# Patient Record
Sex: Male | Born: 1955 | Race: White | Hispanic: No | Marital: Married | Smoking: Never smoker
Health system: Southern US, Community
[De-identification: ages and names within clinical notes are randomized; demographics above are authoritative.]

## PROBLEM LIST (undated history)

## (undated) DIAGNOSIS — I10 Essential (primary) hypertension: Secondary | ICD-10-CM

## (undated) DIAGNOSIS — G4730 Sleep apnea, unspecified: Secondary | ICD-10-CM

## (undated) DIAGNOSIS — L97509 Non-pressure chronic ulcer of other part of unspecified foot with unspecified severity: Secondary | ICD-10-CM

## (undated) DIAGNOSIS — E11621 Type 2 diabetes mellitus with foot ulcer: Secondary | ICD-10-CM

## (undated) DIAGNOSIS — E119 Type 2 diabetes mellitus without complications: Secondary | ICD-10-CM

## (undated) HISTORY — PX: EYE SURGERY: SHX253

## (undated) HISTORY — PX: KNEE ARTHROSCOPY: SHX127

## (undated) HISTORY — PX: BACK SURGERY: SHX140

## (undated) HISTORY — PX: SHOULDER ARTHROSCOPY: SHX128

---

## 2011-09-06 ENCOUNTER — Other Ambulatory Visit: Payer: Self-pay | Admitting: Internal Medicine

## 2011-09-06 DIAGNOSIS — R1011 Right upper quadrant pain: Secondary | ICD-10-CM

## 2011-09-08 ENCOUNTER — Other Ambulatory Visit: Payer: Self-pay

## 2014-10-23 DIAGNOSIS — S83242A Other tear of medial meniscus, current injury, left knee, initial encounter: Secondary | ICD-10-CM | POA: Insufficient documentation

## 2014-10-23 DIAGNOSIS — M1712 Unilateral primary osteoarthritis, left knee: Secondary | ICD-10-CM | POA: Insufficient documentation

## 2015-09-08 DIAGNOSIS — E1142 Type 2 diabetes mellitus with diabetic polyneuropathy: Secondary | ICD-10-CM | POA: Insufficient documentation

## 2015-09-08 DIAGNOSIS — L97512 Non-pressure chronic ulcer of other part of right foot with fat layer exposed: Secondary | ICD-10-CM | POA: Insufficient documentation

## 2018-05-15 ENCOUNTER — Ambulatory Visit: Payer: BC Managed Care – PPO | Admitting: Podiatry

## 2018-05-15 ENCOUNTER — Ambulatory Visit (INDEPENDENT_AMBULATORY_CARE_PROVIDER_SITE_OTHER): Payer: BC Managed Care – PPO

## 2018-05-15 ENCOUNTER — Encounter: Payer: Self-pay | Admitting: Podiatry

## 2018-05-15 VITALS — BP 125/70 | HR 83

## 2018-05-15 DIAGNOSIS — L97521 Non-pressure chronic ulcer of other part of left foot limited to breakdown of skin: Secondary | ICD-10-CM

## 2018-05-15 DIAGNOSIS — L309 Dermatitis, unspecified: Secondary | ICD-10-CM | POA: Diagnosis not present

## 2018-05-15 MED ORDER — CLOTRIMAZOLE-BETAMETHASONE 1-0.05 % EX CREA: 1.0000 "application " | TOPICAL_CREAM | Freq: Two times a day (BID) | CUTANEOUS | 0 refills | Status: DC

## 2018-05-15 MED ORDER — CLOTRIMAZOLE-BETAMETHASONE 1-0.05 % EX CREA: TOPICAL_CREAM | CUTANEOUS | 0 refills | Status: DC

## 2018-05-15 NOTE — Progress Notes (Signed)
dg 

## 2018-05-29 ENCOUNTER — Ambulatory Visit: Payer: BC Managed Care – PPO | Admitting: Podiatry

## 2018-05-29 ENCOUNTER — Encounter: Payer: Self-pay | Admitting: Podiatry

## 2018-05-29 VITALS — BP 130/71 | HR 74 | Temp 98.2°F | Resp 16

## 2018-05-29 DIAGNOSIS — L97521 Non-pressure chronic ulcer of other part of left foot limited to breakdown of skin: Secondary | ICD-10-CM | POA: Diagnosis not present

## 2018-05-29 DIAGNOSIS — B353 Tinea pedis: Secondary | ICD-10-CM | POA: Diagnosis not present

## 2018-05-29 DIAGNOSIS — L309 Dermatitis, unspecified: Secondary | ICD-10-CM | POA: Diagnosis not present

## 2018-05-29 MED ORDER — CLOTRIMAZOLE-BETAMETHASONE 1-0.05 % EX CREA: TOPICAL_CREAM | CUTANEOUS | 0 refills | Status: DC

## 2018-05-29 NOTE — Progress Notes (Signed)
  Subjective:  Patient ID: [name] JeffersonRonald R Depascale, male    DOB: 01/11/1956,  MRN: [id number]  Chief Complaint  Patient presents with  . Foot Ulcer    F/U L foot ulcer Pt. stated," Improving. I think the cream has helped a lot." Tx: lotrisone cream -w/ light clear-bloody drainage -pt. denies N/V/C/F   62 y.o. male returns for wound care. Believes the wound to be improving. States lotrisone helped the irritation on the bottom of the feet.. Denies N/V/F/Ch.  Objective:   Vitals:   05/29/18 0851  BP: 130/71  Pulse: 74  Resp: 16  Temp: 98.2 F (36.8 C)   General AA&O x3. Normal mood and affect.  Vascular Foot warm to touch.  Neurologic Sensation grossly diminished.  Dermatologic (Wound) Wound Location: L heel Wound Measurement: 0.5x0.5x0.1 Wound Base: Granular/Healthy Peri-wound: Calloused, macerated Exudate: None: wound tissue dry  Wound progress: Improved since last check.  Maceration present plantar foot bilat. Vesicular lesions improving.  Orthopedic: No pain to palpation either foot.   Assessment & Plan:  Patient was evaluated and treated and all questions answered.  Ulcer L heel -Debridement as below. -Dressed with medihoney, DSD.  Procedure: Excisional Debridement of Wound Rationale: Removal of non-viable soft tissue from the wound to promote healing.  Anesthesia: none Pre-Debridement Wound Measurements: 0.2 cm x 0.2 cm x 0.1 cm  Post-Debridement Wound Measurements: 0.5 cm x 0.5 cm x 0.1 cm  Type of Debridement: Excisional Tissue Removed: Non-viable soft tissue Depth of Debridement: subq Instrumentation: tissue nipper. Technique: Sharp excisional debridement to bleeding, viable wound base.  Dressing: Dry, sterile, compression dressing. Disposition: Patient tolerated procedure well. Patient to return in 1 week for follow-up.  Tinea with Dermatitis -Continue Lotrisone. Refill sent to pharmacy. Return in about 2 weeks (around 06/12/2018) for Wound Care.

## 2018-06-09 ENCOUNTER — Encounter (HOSPITAL_COMMUNITY): Payer: Self-pay | Admitting: *Deleted

## 2018-06-09 ENCOUNTER — Other Ambulatory Visit: Payer: Self-pay

## 2018-06-09 ENCOUNTER — Observation Stay (HOSPITAL_COMMUNITY)
Admission: EM | Admit: 2018-06-09 | Discharge: 2018-06-10 | Disposition: A | Discharge status: Home / Self Care | Payer: BC Managed Care – PPO | Attending: Internal Medicine | Admitting: Internal Medicine

## 2018-06-09 DIAGNOSIS — E11621 Type 2 diabetes mellitus with foot ulcer: Secondary | ICD-10-CM | POA: Diagnosis not present

## 2018-06-09 DIAGNOSIS — Z794 Long term (current) use of insulin: Secondary | ICD-10-CM | POA: Diagnosis not present

## 2018-06-09 DIAGNOSIS — Z79899 Other long term (current) drug therapy: Secondary | ICD-10-CM | POA: Diagnosis not present

## 2018-06-09 DIAGNOSIS — E1165 Type 2 diabetes mellitus with hyperglycemia: Secondary | ICD-10-CM | POA: Diagnosis not present

## 2018-06-09 DIAGNOSIS — R739 Hyperglycemia, unspecified: Secondary | ICD-10-CM | POA: Diagnosis present

## 2018-06-09 DIAGNOSIS — I10 Essential (primary) hypertension: Secondary | ICD-10-CM | POA: Diagnosis not present

## 2018-06-09 DIAGNOSIS — Z7982 Long term (current) use of aspirin: Secondary | ICD-10-CM | POA: Diagnosis not present

## 2018-06-09 DIAGNOSIS — E860 Dehydration: Secondary | ICD-10-CM | POA: Diagnosis present

## 2018-06-09 DIAGNOSIS — E871 Hypo-osmolality and hyponatremia: Secondary | ICD-10-CM | POA: Insufficient documentation

## 2018-06-09 DIAGNOSIS — E119 Type 2 diabetes mellitus without complications: Secondary | ICD-10-CM

## 2018-06-09 DIAGNOSIS — N179 Acute kidney failure, unspecified: Secondary | ICD-10-CM | POA: Diagnosis not present

## 2018-06-09 DIAGNOSIS — E785 Hyperlipidemia, unspecified: Secondary | ICD-10-CM | POA: Insufficient documentation

## 2018-06-09 DIAGNOSIS — L97509 Non-pressure chronic ulcer of other part of unspecified foot with unspecified severity: Secondary | ICD-10-CM | POA: Diagnosis not present

## 2018-06-09 HISTORY — DX: Essential (primary) hypertension: I10

## 2018-06-09 HISTORY — DX: Type 2 diabetes mellitus with foot ulcer: E11.621

## 2018-06-09 HISTORY — DX: Type 2 diabetes mellitus without complications: E11.9

## 2018-06-09 HISTORY — DX: Non-pressure chronic ulcer of other part of unspecified foot with unspecified severity: L97.509

## 2018-06-09 LAB — URINALYSIS, ROUTINE W REFLEX MICROSCOPIC
BILIRUBIN URINE: NEGATIVE
Bacteria, UA: NONE SEEN
KETONES UR: NEGATIVE mg/dL
LEUKOCYTES UA: NEGATIVE
NITRITE: NEGATIVE
PH: 5 (ref 5.0–8.0)
Protein, ur: NEGATIVE mg/dL
SPECIFIC GRAVITY, URINE: 1.017 (ref 1.005–1.030)

## 2018-06-09 LAB — CBC WITH DIFFERENTIAL/PLATELET
BASOS PCT: 0 %
Basophils Absolute: 0 10*3/uL (ref 0.0–0.1)
EOS PCT: 1 %
Eosinophils Absolute: 0 10*3/uL (ref 0.0–0.7)
HCT: 36.6 % — ABNORMAL LOW (ref 39.0–52.0)
Hemoglobin: 12.9 g/dL — ABNORMAL LOW (ref 13.0–17.0)
LYMPHS PCT: 11 %
Lymphs Abs: 0.8 10*3/uL (ref 0.7–4.0)
MCH: 28.2 pg (ref 26.0–34.0)
MCHC: 35.2 g/dL (ref 30.0–36.0)
MCV: 80.1 fL (ref 78.0–100.0)
MONO ABS: 0.5 10*3/uL (ref 0.1–1.0)
Monocytes Relative: 7 %
Neutro Abs: 5.9 10*3/uL (ref 1.7–7.7)
Neutrophils Relative %: 81 %
PLATELETS: 180 10*3/uL (ref 150–400)
RBC: 4.57 MIL/uL (ref 4.22–5.81)
RDW: 14 % (ref 11.5–15.5)
WBC: 7.2 10*3/uL (ref 4.0–10.5)

## 2018-06-09 LAB — BASIC METABOLIC PANEL
ANION GAP: 9 (ref 5–15)
BUN: 28 mg/dL — AB (ref 8–23)
CALCIUM: 9.3 mg/dL (ref 8.9–10.3)
CO2: 27 mmol/L (ref 22–32)
Chloride: 93 mmol/L — ABNORMAL LOW (ref 98–111)
Creatinine, Ser: 2.14 mg/dL — ABNORMAL HIGH (ref 0.61–1.24)
GFR calc Af Amer: 36 mL/min — ABNORMAL LOW (ref >60)
GFR calc non Af Amer: 31 mL/min — ABNORMAL LOW (ref >60)
GLUCOSE: 557 mg/dL — AB (ref 70–99)
POTASSIUM: 4.5 mmol/L (ref 3.5–5.1)
SODIUM: 129 mmol/L — AB (ref 135–145)

## 2018-06-09 LAB — CBG MONITORING, ED
GLUCOSE-CAPILLARY: 280 mg/dL — AB (ref 70–99)
Glucose-Capillary: 555 mg/dL (ref 70–99)
Glucose-Capillary: 461 mg/dL — ABNORMAL HIGH (ref 70–99)

## 2018-06-09 LAB — CK: CK TOTAL: 233 U/L (ref 49–397)

## 2018-06-09 MED ORDER — ONDANSETRON HCL 4 MG/2ML IJ SOLN: 4.0000 mg | Freq: Four times a day (QID) | INTRAMUSCULAR | Status: DC | PRN

## 2018-06-09 MED ORDER — INSULIN ASPART 100 UNIT/ML ~~LOC~~ SOLN
0.0000 [IU] | Freq: Three times a day (TID) | SUBCUTANEOUS | Status: DC
  Administered 2018-06-10: 3 [IU] via SUBCUTANEOUS

## 2018-06-09 MED ORDER — ACETAMINOPHEN 650 MG RE SUPP: 650.0000 mg | Freq: Four times a day (QID) | RECTAL | Status: DC | PRN

## 2018-06-09 MED ORDER — INSULIN GLARGINE 100 UNIT/ML ~~LOC~~ SOLN
50.0000 [IU] | Freq: Every day | SUBCUTANEOUS | Status: DC
  Filled 2018-06-09: qty 0.5

## 2018-06-09 MED ORDER — INSULIN ASPART 100 UNIT/ML ~~LOC~~ SOLN
11.0000 [IU] | Freq: Once | SUBCUTANEOUS | Status: AC
  Administered 2018-06-09: 11 [IU] via INTRAVENOUS
  Filled 2018-06-09: qty 1

## 2018-06-09 MED ORDER — SODIUM CHLORIDE 0.9 % IV SOLN
INTRAVENOUS | Status: DC
  Administered 2018-06-10: via INTRAVENOUS

## 2018-06-09 MED ORDER — ASPIRIN EC 81 MG PO TBEC
81.0000 mg | DELAYED_RELEASE_TABLET | Freq: Every day | ORAL | Status: DC
  Administered 2018-06-10: 81 mg via ORAL
  Filled 2018-06-09: qty 1

## 2018-06-09 MED ORDER — INSULIN GLARGINE 100 UNIT/ML ~~LOC~~ SOLN
60.0000 [IU] | Freq: Every day | SUBCUTANEOUS | Status: DC
Start: 2018-06-09 — End: 2018-06-09

## 2018-06-09 MED ORDER — ONDANSETRON HCL 4 MG PO TABS: 4.0000 mg | ORAL_TABLET | Freq: Four times a day (QID) | ORAL | Status: DC | PRN

## 2018-06-09 MED ORDER — GI COCKTAIL ~~LOC~~
30.0000 mL | Freq: Once | ORAL | Status: AC
  Administered 2018-06-09: 30 mL via ORAL
  Filled 2018-06-09: qty 30

## 2018-06-09 MED ORDER — ACETAMINOPHEN 325 MG PO TABS: 650.0000 mg | ORAL_TABLET | Freq: Four times a day (QID) | ORAL | Status: DC | PRN

## 2018-06-09 MED ORDER — ATORVASTATIN CALCIUM 10 MG PO TABS: 10.0000 mg | ORAL_TABLET | Freq: Every day | ORAL | Status: DC

## 2018-06-09 MED ORDER — ENOXAPARIN SODIUM 40 MG/0.4ML ~~LOC~~ SOLN
40.0000 mg | SUBCUTANEOUS | Status: DC
  Administered 2018-06-10: 40 mg via SUBCUTANEOUS
  Filled 2018-06-09: qty 0.4

## 2018-06-09 MED ORDER — INSULIN ASPART 100 UNIT/ML ~~LOC~~ SOLN: 4.0000 [IU] | Freq: Three times a day (TID) | SUBCUTANEOUS | Status: DC

## 2018-06-09 MED ORDER — SODIUM CHLORIDE 0.9 % IV BOLUS
1000.0000 mL | Freq: Once | INTRAVENOUS | Status: AC
  Administered 2018-06-09: 1000 mL via INTRAVENOUS

## 2018-06-09 NOTE — ED Provider Notes (Signed)
[facility]-EMERGENCY DEPT Provider Note   CSN: [id number] Arrival date & time: 06/09/18  1722     History   Chief Complaint Chief Complaint  Patient presents with  . Hyperglycemia    HPI [name] is a 62 y.o. male who presents with generalized weakness and hyperglycemia.  Past medical history significant for insulin-dependent diabetes, hypertension, hyperlipidemia.  The patient states that this morning he was in his usual state of health and took his insulin and [place] and ate some bananas.  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  He has been outside until since 9 AM this morning.  Over the course of the day he tried to drink a lot of water and take breaks however he started to feel like his "legs were made of lead" and felt very dehydrated.  He got to the point where he felt he was going to pass out therefore EMS was called.  His blood sugar was checked and was in the 500s.  He states he typically runs between 160 and 200.  He currently feels better after getting 1 L of fluids by EMS.  He denies any recent fever or illness.  He denies any pain, nausea, vomiting. He has been urinating frequently for a couple days.  HPI  History reviewed. No pertinent past medical history.  There are no active problems to display for this patient.   History reviewed. No pertinent surgical history.      Home Medications    Prior to Admission medications   Medication Sig Start Date End Date Taking? Authorizing Provider  aspirin EC 81 MG tablet Take by mouth.    [provider]  atorvastatin (LIPITOR) 10 MG tablet Take by mouth.    [provider]  clotrimazole-betamethasone (LOTRISONE) cream Apply fingertip amount to bottom of both feet daily. 05/29/18   Park LiterPrice, Michael J, DPM  furosemide (LASIX) 40 MG tablet Take by mouth.    [provider]  insulin aspart protamine - aspart (NOVOLOG MIX 70/30 FLEXPEN) (70-30)  100 UNIT/ML FlexPen Inject into the skin.    [provider]  Insulin Degludec (TRESIBA [place]) Inject 80 Units into the skin.    [provider]  Insulin Degludec-Liraglutide (XULTOPHY) 100-3.6 UNIT-MG/ML SOPN Inject into the skin.    [provider]  losartan (COZAAR) 50 MG tablet Take by mouth.    [provider]  UNABLE TO FIND Inject into the skin. Insulin Glargine (TOUJEO SOLOSTAR [place])    [provider]    Family History No family history on file.  Social History Social History   Tobacco Use  . Smoking status: Never Smoker  . Smokeless tobacco: Never Used  Substance Use Topics  . Alcohol use: Not on file    Comment: rarely  . Drug use: Never     Allergies   Patient has no known allergies.   Review of Systems Review of Systems  Constitutional: Negative for fever.  Respiratory: Negative for shortness of breath.   Cardiovascular: Positive for chest pain.  Gastrointestinal: Negative for abdominal pain, nausea and vomiting.  Genitourinary: Positive for frequency.  Neurological: Positive for weakness (generalized) and light-headedness. Negative for syncope.  All other systems reviewed and are negative.    Physical Exam Updated Vital Signs BP (!) 148/74 (BP Location: Right Arm)   Pulse 95   Temp 98 F (36.7 C) (Oral)   Resp 18   SpO2 99%  Physical Exam  Constitutional: He is oriented to person, place, and time. He appears well-developed and well-nourished. No distress.  Calm and cooperative. T shirt is soaked from sweat  HENT:  Head: Normocephalic and atraumatic.  Eyes: Pupils are equal, round, and reactive to light. Conjunctivae are normal. Right eye exhibits no discharge. Left eye exhibits no discharge. No scleral icterus.  Neck: Normal range of motion.  Cardiovascular: Normal rate and regular rhythm.  Pulmonary/Chest: Effort normal and breath sounds normal. No respiratory distress.  Abdominal: Soft. Bowel sounds are  normal. He exhibits no distension. There is no tenderness.  Neurological: He is alert and oriented to person, place, and time.  Skin: Skin is warm and dry.  Psychiatric: He has a normal mood and affect. His behavior is normal.  Nursing note and vitals reviewed.    ED Treatments / Results  Labs (all labs ordered are listed, but only abnormal results are displayed) Labs Reviewed  BASIC METABOLIC PANEL - Abnormal; Notable for the following components:      Result Value   Sodium 129 (*)    Chloride 93 (*)    Glucose, Bld 557 (*)    BUN 28 (*)    Creatinine, Ser 2.14 (*)    GFR calc non Af Amer 31 (*)    GFR calc Af Amer 36 (*)    All other components within normal limits  CBC WITH DIFFERENTIAL/PLATELET - Abnormal; Notable for the following components:   Hemoglobin 12.9 (*)    HCT 36.6 (*)    All other components within normal limits  URINALYSIS, ROUTINE W REFLEX MICROSCOPIC - Abnormal; Notable for the following components:   Glucose, UA >=500 (*)    Hgb urine dipstick SMALL (*)    All other components within normal limits  CBG MONITORING, ED - Abnormal; Notable for the following components:   Glucose-Capillary 555 (*)    All other components within normal limits  CBG MONITORING, ED - Abnormal; Notable for the following components:   Glucose-Capillary 461 (*)    All other components within normal limits  CBG MONITORING, ED - Abnormal; Notable for the following components:   Glucose-Capillary 280 (*)    All other components within normal limits  CK  HIV ANTIBODY (ROUTINE TESTING)  CBC  BASIC METABOLIC PANEL    EKG EKG Interpretation  Date/Time:  Friday June 09 2018 21:09:38 EDT Ventricular Rate:  94 PR Interval:    QRS Duration: 103 QT Interval:  344 QTC Calculation: 431 R Axis:   -24 Text Interpretation:  Sinus rhythm Borderline left axis deviation No old tracing to compare Confirmed by Rolan Bucco [phone]) on 06/09/2018 9:31:11 PM   Radiology No results  found.  Procedures Procedures (including critical care time)  Medications Ordered in ED Medications  acetaminophen (TYLENOL) tablet 650 mg (has no administration in time range)    Or  acetaminophen (TYLENOL) suppository 650 mg (has no administration in time range)  ondansetron (ZOFRAN) tablet 4 mg (has no administration in time range)    Or  ondansetron (ZOFRAN) injection 4 mg (has no administration in time range)  enoxaparin (LOVENOX) injection 40 mg (has no administration in time range)  0.9 %  sodium chloride infusion (has no administration in time range)  aspirin EC tablet 81 mg (has no administration in time range)  atorvastatin (LIPITOR) tablet 10 mg (has no administration in time range)  insulin aspart (novoLOG) injection 0-15 Units (has no administration in time range)  insulin aspart (novoLOG) injection 4  Units (has no administration in time range)  insulin glargine (LANTUS) injection 50 Units (has no administration in time range)  sodium chloride 0.9 % bolus 1,000 mL (0 mLs Intravenous Stopped 06/09/18 2125)  insulin aspart (novoLOG) injection 11 Units (11 Units Intravenous Given 06/09/18 1954)  gi cocktail (Maalox,Lidocaine,Donnatal) (30 mLs Oral Given 06/09/18 2122)     Initial Impression / Assessment and Plan / ED Course  I have reviewed the triage vital signs and the nursing notes.  Pertinent labs & imaging results that were available during my care of the patient were reviewed by me and considered in my medical decision making (see chart for details).  62 year old male presents with generalized weakness, dehydration, hyperglycemia after working outside all day.  He is hypertensive on arrival.  All other vital signs are normal.  Exam is overall unremarkable.  He feels better after receiving fluids by EMS.  Point-of-care glucose is 555.  We will give him additional fluids and insulin and check blood work and urine.  CBC is remarkable for mild anemia.  BMP is remarkable for  hyponatremia (129), hypochloremia (93), marked hyperglycemia (557), elevated BUN and creatinine (28/2.14).  I have no value to compare this to.  Patient denies any known history of chronic kidney disease.  Urine shows over 500 glucose and small hemoglobin.  Repeat CBG is 461.  8:56 PM On recheck the patient states he is now having muscle cramps in the legs.  He is also complaining of some indigestion and chest discomfort.   EKG was reviewed and was obtained by EMS and is normal sinus rhythm.  We will repeat EKG here and give him a GI cocktail.  We will also add on a CK.  11:04 PM CBG is 280. EKG is NSR. He feels better after GI cocktail.  CK is normal. Will admit for further management.    Final Clinical Impressions(s) / ED Diagnoses   Final diagnoses:  Hyperglycemia  AKI (acute kidney injury) [facility])  Dehydration    ED Discharge Orders    None       Bethel Born, PA-C 06/09/18 2305    Rolan Bucco, MD 06/09/18 2350

## 2018-06-09 NOTE — ED Notes (Signed)
Patient aware we need a urine sample.  

## 2018-06-09 NOTE — ED Notes (Signed)
Critical value of glucose 557. PA aware.

## 2018-06-09 NOTE — H&P (Signed)
History and Physical    [name] ZOX:[id number] DOB: 31-Jul-1956 DOA: 06/09/2018  PCP: Hal Morales, NP  Patient coming from: Home  I have personally briefly reviewed patient's old medical records in [facility]  Chief Complaint: Hyperglycemia, dehydration  HPI: [name] is a 62 y.o. male with medical history significant of DM2 with foot ulcers in past, HTN.  Patient presents to the ED with severe dehydration and hyperglycemia.  Took Evaristo Bury and insulin this AM.  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  Over course of day tried to take a lot of breaks and drink a lot of water, however started to have BLE weakness and felt very dehydrated.  EMS called when he got to point he was feeling like he was going to pass out.   ED Course: BGL 500s, 1L IVF with EMS then another 1L in ED.  Sodium 129.  Insulin in ED, repeat BGL 280.  cpk 233, Creat 2.1 unk baseline but not known to have advanced kidney disease.  States he already feels much better post 2L IVF.   Review of Systems: As per HPI otherwise 10 point review of systems negative.   Past Medical History:  Diagnosis Date  . Diabetic foot ulcers (HCC)   . DM2 (diabetes mellitus, type 2) (HCC)   . HTN (hypertension)     History reviewed. No pertinent surgical history.   reports that he has never smoked. He has never used smokeless tobacco. He reports that he drinks alcohol. He reports that he does not use drugs.  No Known Allergies  No family history on file.   Prior to Admission medications   Medication Sig Start Date End Date Taking? Authorizing Provider  aspirin EC 81 MG tablet Take 81 mg by mouth daily.    Yes [provider]  atorvastatin (LIPITOR) 10 MG tablet Take by mouth.   Yes [provider]  clotrimazole-betamethasone (LOTRISONE) cream Apply fingertip amount to bottom of both feet daily. 05/29/18  Yes Park Liter, DPM  furosemide (LASIX) 40 MG tablet Take  20 mg by mouth daily.    Yes [provider]  insulin aspart protamine - aspart (NOVOLOG MIX 70/30 FLEXPEN) (70-30) 100 UNIT/ML FlexPen Inject 11-20 Units into the skin See admin instructions. SLIDING SCALE   Yes [provider]  Insulin Degludec (TRESIBA [place]) Inject 80 Units into the skin.   Yes [provider]  losartan (COZAAR) 50 MG tablet Take by mouth.   Yes [provider]  OZEMPIC 0.25 or 0.5 MG/DOSE SOPN Inject 0.5 mg into the skin once a week. 03/14/18  Yes [provider]  potassium chloride (K-DUR) 10 MEQ tablet Take 10 mEq by mouth daily. 04/12/18  Yes [provider]    Physical Exam: Vitals:   06/09/18 1900 06/09/18 1956 06/09/18 2006 06/09/18 2200  BP: (!) 159/83  114/82 (!) 155/79  Pulse: 92  91 90  Resp: 19  16 20   Temp:      TempSrc:      SpO2: 99%  100% 100%  Weight:  116.1 kg (256 lb)    Height:  6' 0.5" (1.842 m)      Constitutional: NAD, calm, comfortable Eyes: PERRL, lids and conjunctivae normal ENMT: Mucous membranes are moist. Posterior pharynx clear of any exudate or lesions.Normal dentition.  Neck: normal, supple, no masses, no thyromegaly Respiratory: clear to auscultation bilaterally, no wheezing, no crackles. Normal respiratory effort.  No accessory muscle use.  Cardiovascular: Regular rate and rhythm, no murmurs / rubs / gallops. No extremity edema. 2+ pedal pulses. No carotid bruits.  Abdomen: no tenderness, no masses palpated. No hepatosplenomegaly. Bowel sounds positive.  Musculoskeletal: no clubbing / cyanosis. No joint deformity upper and lower extremities. Good ROM, no contractures. Normal muscle tone.  Skin: no rashes, lesions, ulcers. No induration Neurologic: CN 2-12 grossly intact. Sensation intact, DTR normal. Strength 5/5 in all 4.  Psychiatric: Normal judgment and insight. Alert and oriented x 3. Normal mood.    Labs on Admission: I have personally reviewed following labs and imaging  studies  CBC: Recent Labs  Lab 06/09/18 1757  WBC 7.2  NEUTROABS 5.9  HGB 12.9*  HCT 36.6*  MCV 80.1  PLT 180   Basic Metabolic Panel: Recent Labs  Lab 06/09/18 1757  NA 129*  K 4.5  CL 93*  CO2 27  GLUCOSE 557*  BUN 28*  CREATININE 2.14*  CALCIUM 9.3   GFR: Estimated Creatinine Clearance: 47.4 mL/min (A) (by C-G formula based on SCr of 2.14 mg/dL (H)). Liver Function Tests: No results for input(s): AST, ALT, ALKPHOS, BILITOT, PROT, ALBUMIN in the last 168 hours. No results for input(s): LIPASE, AMYLASE in the last 168 hours. No results for input(s): AMMONIA in the last 168 hours. Coagulation Profile: No results for input(s): INR, PROTIME in the last 168 hours. Cardiac Enzymes: Recent Labs  Lab 06/09/18 2055  CKTOTAL 233   BNP (last 3 results) No results for input(s): PROBNP in the last 8760 hours. HbA1C: No results for input(s): HGBA1C in the last 72 hours. CBG: Recent Labs  Lab 06/09/18 1755 06/09/18 2008 06/09/18 2208  GLUCAP 555* 461* 280*   Lipid Profile: No results for input(s): CHOL, HDL, LDLCALC, TRIG, CHOLHDL, LDLDIRECT in the last 72 hours. Thyroid Function Tests: No results for input(s): TSH, T4TOTAL, FREET4, T3FREE, THYROIDAB in the last 72 hours. Anemia Panel: No results for input(s): VITAMINB12, FOLATE, FERRITIN, TIBC, IRON, RETICCTPCT in the last 72 hours. Urine analysis:    Component Value Date/Time   COLORURINE YELLOW 06/09/2018 1757   APPEARANCEUR CLEAR 06/09/2018 1757   LABSPEC 1.017 06/09/2018 1757   PHURINE 5.0 06/09/2018 1757   GLUCOSEU >=500 (A) 06/09/2018 1757   HGBUR SMALL (A) 06/09/2018 1757   BILIRUBINUR NEGATIVE 06/09/2018 1757   KETONESUR NEGATIVE 06/09/2018 1757   PROTEINUR NEGATIVE 06/09/2018 1757   NITRITE NEGATIVE 06/09/2018 1757   LEUKOCYTESUR NEGATIVE 06/09/2018 1757    Radiological Exams on Admission: No results found.  EKG: Independently reviewed.  Assessment/Plan Principal Problem:   AKI (acute  kidney injury) (HCC) Active Problems:   DM2 (diabetes mellitus, type 2) (HCC)   HTN (hypertension)   Dehydration    1. AKI - ddx includes CKD stage 3 due to DM 1. Hold lasix and losartan 2. IVF 3. Repeat BMP in AM 2. Dehydration - 1. IVF as above 2. Repeat BMP in AM 3. DM2 - 1. Lantus 50 QAM 2. Mod SSI AC 3. 4u mealtime novolog 4. HTN - 1. Holding losartan and lasix as above  DVT prophylaxis: Lovenox Code Status: Full Family Communication: No family in room Disposition Plan: Home after admit Consults called: None Admission status: Place in obs   GARDNER, KentuckyJARED M. DO [facility] Pager [phone] Only works nights!  If 7AM-7PM, please contact the primary day team physician taking care of patient  [email or web address] Password [email or web address]  06/09/2018, 10:59 PM

## 2018-06-09 NOTE — ED Notes (Signed)
Lab added on CK.

## 2018-06-09 NOTE — ED Notes (Signed)
ED TO INPATIENT HANDOFF REPORT  Name/Age/Gender [name] 62 y.o. male  Code Status    Code Status Orders  (From admission, onward)        Start     Ordered   06/09/18 2249  Full code  Continuous     06/09/18 2249    Code Status History    This patient has a current code status but no historical code status.      Home/SNF/Other Home  Chief Complaint hyperglycemia/ weakness   Level of Care/Admitting Diagnosis ED Disposition    ED Disposition Condition Comment   Admit  Hospital Area: [facility] [[id number]]  Level of Care: Med-Surg [16]  Diagnosis: AKI (acute kidney injury) [facility]) [800349]  Admitting Physician: Etta Quill [id number]  Attending Physician: Etta Quill [[id number]]  PT Class (Do Not Modify): Observation [104]  PT Acc Code (Do Not Modify): Observation [10022]       Medical History Past Medical History:  Diagnosis Date  . Diabetic foot ulcers ([place])   . DM2 (diabetes mellitus, type 2) ([place])   . HTN (hypertension)     Allergies No Known Allergies  IV Location/Drains/Wounds Patient Lines/Drains/Airways Status   Active Line/Drains/Airways    Name:   Placement date:   Placement time:   Site:   Days:   Peripheral IV 06/09/18 Left Antecubital   06/09/18    1750    Antecubital   less than 1          Labs/Imaging Results for orders placed or performed during the hospital encounter of 06/09/18 (from the past 48 hour(s))  CBG monitoring, ED     Status: Abnormal   Collection Time: 06/09/18  5:55 PM  Result Value Ref Range   Glucose-Capillary 555 (HH) 70 - 99 mg/dL  Basic metabolic panel     Status: Abnormal   Collection Time: 06/09/18  5:57 PM  Result Value Ref Range   Sodium 129 (L) 135 - 145 mmol/L   Potassium 4.5 3.5 - 5.1 mmol/L   Chloride 93 (L) 98 - 111 mmol/L    Comment: Please note change in reference range.   CO2 27 22 - 32 mmol/L   Glucose, Bld 557 (HH) 70 - 99 mg/dL    Comment: Please note change in  reference range. CRITICAL RESULT CALLED TO, READ BACK BY AND VERIFIED WITH: BAUGHN,L. RN '@[id number]'  ON 06.28.19 BY COHEN,K    BUN 28 (H) 8 - 23 mg/dL    Comment: Please note change in reference range.   Creatinine, Ser 2.14 (H) 0.61 - 1.24 mg/dL   Calcium 9.3 8.9 - 10.3 mg/dL   GFR calc non Af Amer 31 (L) >60 mL/min   GFR calc Af Amer 36 (L) >60 mL/min    Comment: (NOTE) The eGFR has been calculated using the CKD EPI equation. This calculation has not been validated in all clinical situations. eGFR's persistently <60 mL/min signify possible Chronic Kidney Disease.    Anion gap 9 5 - 15    Comment: Performed at [facility], [place] [address] [place], [place] [address]  CBC with Differential     Status: Abnormal   Collection Time: 06/09/18  5:57 PM  Result Value Ref Range   WBC 7.2 4.0 - 10.5 K/uL   RBC 4.57 4.22 - 5.81 MIL/uL   Hemoglobin 12.9 (L) 13.0 - 17.0 g/dL   HCT 36.6 (L) 39.0 - 52.0 %   MCV 80.1 78.0 - 100.0 fL  MCH 28.2 26.0 - 34.0 pg   MCHC 35.2 30.0 - 36.0 g/dL   RDW 14.0 11.5 - 15.5 %   Platelets 180 150 - 400 K/uL   Neutrophils Relative % 81 %   Neutro Abs 5.9 1.7 - 7.7 K/uL   Lymphocytes Relative 11 %   Lymphs Abs 0.8 0.7 - 4.0 K/uL   Monocytes Relative 7 %   Monocytes Absolute 0.5 0.1 - 1.0 K/uL   Eosinophils Relative 1 %   Eosinophils Absolute 0.0 0.0 - 0.7 K/uL   Basophils Relative 0 %   Basophils Absolute 0.0 0.0 - 0.1 K/uL    Comment: Performed at [facility], [place] [address], [place], [place] [address]  Urinalysis, Routine w reflex microscopic     Status: Abnormal   Collection Time: 06/09/18  5:57 PM  Result Value Ref Range   Color, Urine YELLOW YELLOW   APPearance CLEAR CLEAR   Specific Gravity, Urine 1.017 1.005 - 1.030   pH 5.0 5.0 - 8.0   Glucose, UA >=500 (A) NEGATIVE mg/dL   Hgb urine dipstick SMALL (A) NEGATIVE   Bilirubin Urine NEGATIVE NEGATIVE   Ketones, ur NEGATIVE NEGATIVE mg/dL   Protein, ur NEGATIVE  NEGATIVE mg/dL   Nitrite NEGATIVE NEGATIVE   Leukocytes, UA NEGATIVE NEGATIVE   RBC / HPF 0-5 0 - 5 RBC/hpf   WBC, UA 0-5 0 - 5 WBC/hpf   Bacteria, UA NONE SEEN NONE SEEN   Mucus PRESENT    Hyaline Casts, UA PRESENT     Comment: Performed at [facility], [place] [address], [place], [place] [address]  POC CBG, ED     Status: Abnormal   Collection Time: 06/09/18  8:08 PM  Result Value Ref Range   Glucose-Capillary 461 (H) 70 - 99 mg/dL  CK     Status: None   Collection Time: 06/09/18  8:55 PM  Result Value Ref Range   Total CK 233 49 - 397 U/L    Comment: Performed at [facility], [place] [address] [place], [place] [address]  POC CBG, ED     Status: Abnormal   Collection Time: 06/09/18 10:08 PM  Result Value Ref Range   Glucose-Capillary 280 (H) 70 - 99 mg/dL   No results found.  Pending Labs Unresulted Labs (From admission, onward)   Start     Ordered   06/10/18 0500  CBC  Tomorrow morning,   R     06/09/18 2249   06/10/18 7482  Basic metabolic panel  Tomorrow morning,   R     06/09/18 2249   06/09/18 2249  HIV antibody (Routine Testing)  Once,   R     06/09/18 2249      Vitals/Pain Today's Vitals   06/09/18 1900 06/09/18 1956 06/09/18 2006 06/09/18 2200  BP: (!) 159/83  114/82 (!) 155/79  Pulse: 92  91 90  Resp: '19  16 20  ' Temp:      TempSrc:      SpO2: 99%  100% 100%  Weight:  256 lb (116.1 kg)    Height:  6' 0.5" (1.842 m)      Isolation Precautions No active isolations  Medications Medications  acetaminophen (TYLENOL) tablet 650 mg (has no administration in time range)    Or  acetaminophen (TYLENOL) suppository 650 mg (has no administration in time range)  ondansetron (ZOFRAN) tablet 4 mg (has no administration in time range)    Or  ondansetron (ZOFRAN)  injection 4 mg (has no administration in time range)  enoxaparin (LOVENOX) injection 40 mg (has no administration in time range)  0.9 %  sodium chloride infusion (has  no administration in time range)  aspirin EC tablet 81 mg (has no administration in time range)  atorvastatin (LIPITOR) tablet 10 mg (has no administration in time range)  insulin aspart (novoLOG) injection 0-15 Units (has no administration in time range)  insulin aspart (novoLOG) injection 4 Units (has no administration in time range)  insulin glargine (LANTUS) injection 50 Units (has no administration in time range)  sodium chloride 0.9 % bolus 1,000 mL (0 mLs Intravenous Stopped 06/09/18 2125)  insulin aspart (novoLOG) injection 11 Units (11 Units Intravenous Given 06/09/18 1954)  gi cocktail (Maalox,Lidocaine,Donnatal) (30 mLs Oral Given 06/09/18 2122)    Mobility walks

## 2018-06-09 NOTE — ED Triage Notes (Signed)
Per EMS, pt called out for hyperglycemia.  Pt states he felt weak and had the nurse check his blood sugar, which was 510. 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 Pt states he took his long acting insulin this morning.

## 2018-06-10 DIAGNOSIS — L97509 Non-pressure chronic ulcer of other part of unspecified foot with unspecified severity: Secondary | ICD-10-CM

## 2018-06-10 DIAGNOSIS — N179 Acute kidney failure, unspecified: Secondary | ICD-10-CM

## 2018-06-10 DIAGNOSIS — I10 Essential (primary) hypertension: Secondary | ICD-10-CM | POA: Diagnosis not present

## 2018-06-10 DIAGNOSIS — E11621 Type 2 diabetes mellitus with foot ulcer: Secondary | ICD-10-CM | POA: Diagnosis not present

## 2018-06-10 DIAGNOSIS — Z794 Long term (current) use of insulin: Secondary | ICD-10-CM | POA: Diagnosis not present

## 2018-06-10 DIAGNOSIS — E860 Dehydration: Secondary | ICD-10-CM | POA: Diagnosis not present

## 2018-06-10 DIAGNOSIS — R739 Hyperglycemia, unspecified: Secondary | ICD-10-CM

## 2018-06-10 LAB — CBC
HEMATOCRIT: 34.2 % — AB (ref 39.0–52.0)
HEMOGLOBIN: 11.9 g/dL — AB (ref 13.0–17.0)
MCH: 27.9 pg (ref 26.0–34.0)
MCHC: 34.8 g/dL (ref 30.0–36.0)
MCV: 80.1 fL (ref 78.0–100.0)
Platelets: 146 10*3/uL — ABNORMAL LOW (ref 150–400)
RBC: 4.27 MIL/uL (ref 4.22–5.81)
RDW: 14.2 % (ref 11.5–15.5)
WBC: 5.3 10*3/uL (ref 4.0–10.5)

## 2018-06-10 LAB — BASIC METABOLIC PANEL
ANION GAP: 10 (ref 5–15)
BUN: 23 mg/dL (ref 8–23)
CALCIUM: 8.9 mg/dL (ref 8.9–10.3)
CHLORIDE: 101 mmol/L (ref 98–111)
CO2: 24 mmol/L (ref 22–32)
Creatinine, Ser: 1.34 mg/dL — ABNORMAL HIGH (ref 0.61–1.24)
GFR calc Af Amer: >60 mL/min (ref >60)
GFR calc non Af Amer: 55 mL/min — ABNORMAL LOW (ref >60)
GLUCOSE: 227 mg/dL — AB (ref 70–99)
POTASSIUM: 3.7 mmol/L (ref 3.5–5.1)
Sodium: 135 mmol/L (ref 135–145)

## 2018-06-10 LAB — HIV ANTIBODY (ROUTINE TESTING W REFLEX): HIV SCREEN 4TH GENERATION: NONREACTIVE

## 2018-06-10 LAB — GLUCOSE, CAPILLARY
Glucose-Capillary: 183 mg/dL — ABNORMAL HIGH (ref 70–99)
Glucose-Capillary: 267 mg/dL — ABNORMAL HIGH (ref 70–99)

## 2018-06-10 NOTE — Progress Notes (Signed)
DC instructions provided to patient and explained in detail. Patient verbalized understanding. Patient escorted to lobby in wheelchair to be discharged home via [facility].

## 2018-06-10 NOTE — Progress Notes (Signed)
Pt states that he performs daily wound care to left heel wound and is being followed by foot MD for a diabetic foot ulcer. Pt states that he applies foam type dressing and then wraps with dry dressing. Pt did not want RN to remove dressing at this time, stating that he had changed it this morning and it was not due to be changed. Pt states that he will allow RN to assess wound in AM if he is not discharged.

## 2018-06-10 NOTE — Discharge Instructions (Signed)

## 2018-06-10 NOTE — Discharge Summary (Addendum)
Discharge Summary  [name] ZDG:[id number] DOB: 1956/03/29  PCP: Hal Morales, NP  Admit date: 06/09/2018 Discharge date: 06/10/2018  Time spent: 25 minutes  Recommendations for Outpatient Follow-up:  1. Take your medications as prescribed 2. Follow-up with PCP   Discharge Diagnoses:  Active Hospital Problems   Diagnosis Date Noted  . AKI (acute kidney injury) (HCC) 06/09/2018  . DM2 (diabetes mellitus, type 2) (HCC) 06/09/2018  . HTN (hypertension) 06/09/2018  . Dehydration 06/09/2018    Resolved Hospital Problems  No resolved problems to display.    Discharge Condition: Stable  Diet recommendation: Resume previous diet  Vitals:   06/10/18 0016 06/10/18 0442  BP: 136/72 (!) 142/74  Pulse: 87 81  Resp: 20 17  Temp: 98.7 F (37.1 C) 98.7 F (37.1 C)  SpO2: 100% 100%    History of present illness:  [name] is a 62 y.o. male with medical history significant of DM2 with foot ulcers in past, HTN.  Patient presents to the ED with severe dehydration and hyperglycemia.  Took [name] and insulin this AM.  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  Over course of day tried to take a lot of breaks and drink a lot of water, however started to have BLE weakness and felt very dehydrated.  EMS called when he got to point he was feeling like he was going to pass out.   ED Course: BGL 500s, 1L IVF with EMS then another 1L in ED.  Sodium 129.  Insulin in ED, repeat BGL 280.  cpk 233, Creat 2.1 unk baseline but not known to have advanced kidney disease.  States he already feels much better post 2L IVF.  06/10/2018: Patient seen and examined at his bedside.  Has no new complaints.  States he had forgotten to take his short acting insulin prior to leaving for work.  On the day of discharge patient was hemodynamically stable.  He will need to follow-up with his primary care provider post hospitalization.  Hospital Course:  Principal Problem:  AKI (acute kidney injury) (HCC) Active Problems:   DM2 (diabetes mellitus, type 2) (HCC)   HTN (hypertension)   Dehydration  1. AKI suspect prerenal secondary to dehydration-resolving after IV fluid hydration-creatinine 1.34 from 2.18.  Follow-up with your primary care provider within a week. 2. Acute dehydration -Resolved after IV fluid hydration-encouraged increasing oral fluid intake.   3. DM2 continue insulin home regimen-follow-up with your primary care provider within a week. 4. HTN resume home medications.  Suspect AKI was secondary to dehydration, not from his medications. 5. Elevated CPK-233 suspect secondary to strenuous physical work which was self-reported.  Increase oral fluid intake.  Follow-up with your PCP within a week.   Procedures:  None  Consultations:  None  Discharge Exam: BP (!) 142/74 (BP Location: Right Arm)   Pulse 81   Temp 98.7 F (37.1 C) (Oral)   Resp 17   Ht 6' 0.5" (1.842 m)   Wt 116.1 kg (256 lb)   SpO2 100%   BMI 34.24 kg/m  . General: 62 y.o. year-old male well developed well nourished in no acute distress.  Alert and oriented x3. . Cardiovascular: Regular rate and rhythm with no rubs or gallops.  No thyromegaly or JVD noted.   Marland Kitchen Respiratory: Clear to auscultation with no wheezes or rales. Good inspiratory effort. . Abdomen: Soft nontender nondistended with normal bowel sounds x4 quadrants. . Musculoskeletal: No lower extremity  edema. 2/4 pulses in all 4 extremities. . Skin: No ulcerative lesions noted or rashes, . Psychiatry: Mood is appropriate for condition and setting  Discharge Instructions You were cared for by a hospitalist during your hospital stay. If you have any questions about your discharge medications or the care you received while you were in the hospital after you are discharged, you can call the unit and asked to speak with the hospitalist on call if the hospitalist that took care of you is not available. Once you are  discharged, your primary care physician will handle any further medical issues. Please note that NO REFILLS for any discharge medications will be authorized once you are discharged, as it is imperative that you return to your primary care physician (or establish a relationship with a primary care physician if you do not have one) for your aftercare needs so that they can reassess your need for medications and monitor your lab values.   Allergies as of 06/10/2018   No Known Allergies     Medication List    TAKE these medications   aspirin EC 81 MG tablet Take 81 mg by mouth daily.   atorvastatin 10 MG tablet Commonly known as:  LIPITOR Take by mouth.   clotrimazole-betamethasone cream Commonly known as:  LOTRISONE Apply fingertip amount to bottom of both feet daily.   furosemide 40 MG tablet Commonly known as:  LASIX Take 20 mg by mouth daily.   losartan 50 MG tablet Commonly known as:  COZAAR Take by mouth.   NOVOLOG MIX 70/30 FLEXPEN (70-30) 100 UNIT/ML FlexPen Generic drug:  insulin aspart protamine - aspart Inject 11-20 Units into the skin See admin instructions. SLIDING SCALE   OZEMPIC 0.25 or 0.5 MG/DOSE Sopn Generic drug:  Semaglutide Inject 0.5 mg into the skin once a week.   potassium chloride 10 MEQ tablet Commonly known as:  K-DUR Take 10 mEq by mouth daily.   TRESIBA  Inject 80 Units into the skin.      No Known Allergies Follow-up Information    Hal Morales, NP. Call in 1 day(s).   Specialty:  Nurse Practitioner Why:  Please call for appointment. Contact information: [address] [place] [address] [phone]            The results of significant diagnostics from this hospitalization (including imaging, microbiology, ancillary and laboratory) are listed below for reference.    Significant Diagnostic Studies: Dg Foot Complete Left  Result Date: 05/15/2018 Please see detailed radiograph report in office note.   Microbiology: No  results found for this or any previous visit (from the past 240 hour(s)).   Labs: Basic Metabolic Panel: Recent Labs  Lab 06/09/18 1757 06/10/18 0502  NA 129* 135  K 4.5 3.7  CL 93* 101  CO2 27 24  GLUCOSE 557* 227*  BUN 28* 23  CREATININE 2.14* 1.34*  CALCIUM 9.3 8.9   Liver Function Tests: No results for input(s): AST, ALT, ALKPHOS, BILITOT, PROT, ALBUMIN in the last 168 hours. No results for input(s): LIPASE, AMYLASE in the last 168 hours. No results for input(s): AMMONIA in the last 168 hours. CBC: Recent Labs  Lab 06/09/18 1757 06/10/18 0502  WBC 7.2 5.3  NEUTROABS 5.9  --   HGB 12.9* 11.9*  HCT 36.6* 34.2*  MCV 80.1 80.1  PLT 180 146*   Cardiac Enzymes: Recent Labs  Lab 06/09/18 2055  CKTOTAL 233   BNP: BNP (last 3 results) No results for input(s): BNP in  the last 8760 hours.  ProBNP (last 3 results) No results for input(s): PROBNP in the last 8760 hours.  CBG: Recent Labs  Lab 06/09/18 1755 06/09/18 2008 06/09/18 2208 06/10/18 0025 06/10/18 0754  GLUCAP 555* 461* 280* 267* 183*       Signed:  Darlin Droparole N Hall, MD [facility] 06/10/2018, 10:58 AM

## 2018-06-10 NOTE — Plan of Care (Signed)
Reviewed plan of care with patient, specifically safety precautions and importance of notifying RN with questions or concerns. Pt attentive and verbalized understanding of all education.

## 2018-06-11 NOTE — Progress Notes (Signed)
  Subjective:  Patient ID: [name], male    DOB: 1956/11/20,  MRN: [id number]  Chief Complaint  Patient presents with  . Foot Ulcer    left foot heel requesting debridement   62 y.o. male presents with the above complaint.  Reports history of ulcer to the left heel x2 to 3 months also complains of scaling and itching to the bottom of the foot Past Medical History:  Diagnosis Date  . Diabetic foot ulcers (HCC)   . DM2 (diabetes mellitus, type 2) (HCC)   . HTN (hypertension)    No past surgical history on file.  Current Outpatient Medications:  .  aspirin EC 81 MG tablet, Take 81 mg by mouth daily. , Disp: , Rfl:  .  atorvastatin (LIPITOR) 10 MG tablet, Take by mouth., Disp: , Rfl:  .  furosemide (LASIX) 40 MG tablet, Take 20 mg by mouth daily. , Disp: , Rfl:  .  insulin aspart protamine - aspart (NOVOLOG MIX 70/30 FLEXPEN) (70-30) 100 UNIT/ML FlexPen, Inject 11-20 Units into the skin See admin instructions. SLIDING SCALE, Disp: , Rfl:  .  Insulin Degludec (TRESIBA [place]), Inject 80 Units into the skin., Disp: , Rfl:  .  losartan (COZAAR) 50 MG tablet, Take by mouth., Disp: , Rfl:  .  clotrimazole-betamethasone (LOTRISONE) cream, Apply fingertip amount to bottom of both feet daily., Disp: 30 g, Rfl: 0 .  OZEMPIC 0.25 or 0.5 MG/DOSE SOPN, Inject 0.5 mg into the skin once a week., Disp: , Rfl: 5 .  potassium chloride (K-DUR) 10 MEQ tablet, Take 10 mEq by mouth daily., Disp: , Rfl: 0  No Known Allergies Review of Systems: Negative except as noted in the HPI. Denies N/V/F/Ch. Objective:   Vitals:   05/15/18 1037  BP: 125/70  Pulse: 83   General AA&O x3. Normal mood and affect.  Vascular Dorsalis pedis and posterior tibial pulses  present 2+ bilaterally  Capillary refill normal to all digits. Pedal hair growth normal.  Neurologic Epicritic sensation grossly present.  Dermatologic No open lesions. Interspaces clear of maceration. Nails well groomed and normal in  appearance. Left heel superficial ulcer one by one post debridement without erythema warmth or signs of infection    Orthopedic: MMT 5/5 in dorsiflexion, plantarflexion, inversion, and eversion. Normal joint ROM without pain or crepitus.   Assessment & Plan:  Patient was evaluated and treated and all questions answered.  Debrided as below left heel wound -Debrided as below.  Procedure: Selective Debridement of Wound Rationale: Removal of devitalized tissue from the wound to promote healing.  Pre-Debridement Wound Measurements: 1 cm x 1 cm x 0.2 cm  Post-Debridement Wound Measurements: same as pre-debridement. Type of Debridement: Selective Tissue Removed: Devitalized soft-tissue Instrumentation: 3-0 mm dermal curette Dressing: Dry, sterile, compression dressing. Disposition: Patient tolerated procedure well. Patient to return in 1 week for follow-up.    Dermatitis -Rx Lotrisone.  Return in about 2 weeks (around 05/29/2018) for Wound Care.

## 2018-06-12 ENCOUNTER — Ambulatory Visit: Payer: BC Managed Care – PPO | Admitting: Podiatry

## 2018-06-20 ENCOUNTER — Ambulatory Visit: Payer: BC Managed Care – PPO | Admitting: Podiatry

## 2018-08-29 DIAGNOSIS — G5620 Lesion of ulnar nerve, unspecified upper limb: Secondary | ICD-10-CM | POA: Insufficient documentation

## 2018-09-18 ENCOUNTER — Ambulatory Visit: Payer: BC Managed Care – PPO | Admitting: Podiatry

## 2018-09-18 ENCOUNTER — Encounter: Payer: Self-pay | Admitting: Podiatry

## 2018-09-18 VITALS — BP 144/82 | HR 84 | Temp 98.4°F | Resp 16

## 2018-09-18 DIAGNOSIS — L97421 Non-pressure chronic ulcer of left heel and midfoot limited to breakdown of skin: Secondary | ICD-10-CM

## 2018-09-18 DIAGNOSIS — E11621 Type 2 diabetes mellitus with foot ulcer: Secondary | ICD-10-CM

## 2018-09-18 NOTE — Progress Notes (Signed)
Subjective:  Patient ID: [name], male    DOB: 1956-07-27,  MRN: [id number]  Chief Complaint  Patient presents with  . Foot Ulcer    F/U L heel ulcer Pt. stated," when I'm on my feet a lot it gets very sore, otherwise it's doing fine." Tx: cushion pads -pt denies N/V/F/?Ch/swelling or redness -w/ clear drainage    62 y.o. male presents for wound care. States the wound has had some clear drainage denies swelling redness. Patient was last seen in June and did not follow up as directed.  Review of Systems: Negative except as noted in the HPI. Denies N/V/F/Ch.  Past Medical History:  Diagnosis Date  . Diabetic foot ulcers (HCC)   . DM2 (diabetes mellitus, type 2) (HCC)   . HTN (hypertension)     Current Outpatient Medications:  .  aspirin EC 81 MG tablet, Take 81 mg by mouth daily. , Disp: , Rfl:  .  atorvastatin (LIPITOR) 10 MG tablet, Take by mouth., Disp: , Rfl:  .  clotrimazole-betamethasone (LOTRISONE) cream, Apply fingertip amount to bottom of both feet daily., Disp: 30 g, Rfl: 0 .  furosemide (LASIX) 40 MG tablet, Take 20 mg by mouth daily. , Disp: , Rfl:  .  insulin aspart protamine - aspart (NOVOLOG MIX 70/30 FLEXPEN) (70-30) 100 UNIT/ML FlexPen, Inject 11-20 Units into the skin See admin instructions. SLIDING SCALE, Disp: , Rfl:  .  Insulin Degludec (TRESIBA [place]), Inject 80 Units into the skin., Disp: , Rfl:  .  losartan (COZAAR) 50 MG tablet, Take by mouth., Disp: , Rfl:  .  OZEMPIC 0.25 or 0.5 MG/DOSE SOPN, Inject 0.5 mg into the skin once a week., Disp: , Rfl: 5 .  potassium chloride (K-DUR) 10 MEQ tablet, Take 10 mEq by mouth daily., Disp: , Rfl: 0  Social History   Tobacco Use  Smoking Status Never Smoker  Smokeless Tobacco Never Used    Allergies  Allergen Reactions  . Other    Objective:   Vitals:   09/18/18 1320  BP: (!) 144/82  Pulse: 84  Resp: 16  Temp: 98.4 F (36.9 C)   There is no height or weight on file to calculate BMI. Constitutional  Well developed. Well nourished.  Vascular Dorsalis pedis pulses palpable bilaterally. Posterior tibial pulses palpable bilaterally. Capillary refill normal to all digits.  No cyanosis or clubbing noted. Pedal hair growth normal.  Neurologic Normal speech. Oriented to person, place, and time. Protective sensation absent  Dermatologic Wound Location: L heel Wound Base: Granular/Healthy Peri-wound: Macerated, Calloused Exudate: Moderate amount Serosanguinous exudate Wound Measurements: -1.5x1.5 superficial   Orthopedic: No pain to palpation either foot.   Radiographs: None today Assessment:   1. 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  Ulcer L Heel -Debridement as below. -Dressed with medihoney, DSD. -Continue off-loading with surgical shoe.  Procedure: Excisional Debridement of Wound Rationale: Removal of non-viable soft tissue from the wound to promote healing.  Anesthesia: none Pre-Debridement Wound Measurements: 0.5 cm x 0.5 cm   Post-Debridement Wound Measurements: 1.5 cm x 1.5 cm  Type of Debridement: Sharp Excisional Tissue Removed: Non-viable soft tissue Depth of Debridement: subcutaneous tissue. Technique: Sharp excisional debridement to bleeding, viable wound base.  Dressing: Dry, sterile, compression dressing. Disposition: Patient tolerated procedure well. Patient to return in 1 week for follow-up.  Return in about 2  weeks (around 10/02/2018) for Wound Care, Left heel.

## 2018-10-02 ENCOUNTER — Encounter: Payer: Self-pay | Admitting: Podiatry

## 2018-10-02 ENCOUNTER — Ambulatory Visit: Payer: BC Managed Care – PPO | Admitting: Podiatry

## 2018-10-02 VITALS — BP 143/83 | HR 88 | Temp 98.5°F | Resp 16

## 2018-10-02 DIAGNOSIS — E11621 Type 2 diabetes mellitus with foot ulcer: Secondary | ICD-10-CM | POA: Diagnosis not present

## 2018-10-02 DIAGNOSIS — L97511 Non-pressure chronic ulcer of other part of right foot limited to breakdown of skin: Secondary | ICD-10-CM | POA: Diagnosis not present

## 2018-10-02 DIAGNOSIS — L97421 Non-pressure chronic ulcer of left heel and midfoot limited to breakdown of skin: Secondary | ICD-10-CM | POA: Diagnosis not present

## 2018-10-02 DIAGNOSIS — E08621 Diabetes mellitus due to underlying condition with foot ulcer: Secondary | ICD-10-CM | POA: Diagnosis not present

## 2018-10-02 MED ORDER — SILVER SULFADIAZINE 1 % EX CREA: TOPICAL_CREAM | CUTANEOUS | 0 refills | Status: DC

## 2018-10-02 NOTE — Patient Instructions (Signed)
Apply prisma to the wound daily until it is gone, then switch to the cream I sent to the pharmacy.

## 2018-10-09 ENCOUNTER — Ambulatory Visit: Payer: BC Managed Care – PPO | Admitting: Podiatry

## 2018-10-09 DIAGNOSIS — E08621 Diabetes mellitus due to underlying condition with foot ulcer: Secondary | ICD-10-CM

## 2018-10-09 DIAGNOSIS — L97421 Non-pressure chronic ulcer of left heel and midfoot limited to breakdown of skin: Secondary | ICD-10-CM

## 2018-10-09 DIAGNOSIS — E11621 Type 2 diabetes mellitus with foot ulcer: Secondary | ICD-10-CM

## 2018-10-09 DIAGNOSIS — L97511 Non-pressure chronic ulcer of other part of right foot limited to breakdown of skin: Secondary | ICD-10-CM | POA: Diagnosis not present

## 2018-10-09 NOTE — Progress Notes (Signed)
Subjective:  Patient ID: [name], male    DOB: [birth year],  MRN: [id number]  Chief Complaint  Patient presents with  . Foot Ulcer    1 wks fu bilateral ulcers left heel and Right 1st met still having a lot of drainage     62 y.o. male presents for wound care. History as above.  Review of Systems: Negative except as noted in the HPI. Denies N/V/F/Ch.  Past Medical History:  Diagnosis Date  . Diabetic foot ulcers ([place])   . DM2 (diabetes mellitus, type 2) ([place])   . HTN (hypertension)     Current Outpatient Medications:  .  aspirin EC 81 MG tablet, Take 81 mg by mouth daily. , Disp: , Rfl:  .  atorvastatin (LIPITOR) 10 MG tablet, Take by mouth., Disp: , Rfl:  .  clotrimazole-betamethasone (LOTRISONE) cream, Apply fingertip amount to bottom of both feet daily., Disp: 30 g, Rfl: 0 .  EPINEPHrine (EPIPEN 2-PAK) 0.3 mg/0.3 mL IJ SOAJ injection, EpiPen 2-Pak 0.3 mg/0.3 mL injection, auto-injector, Disp: , Rfl:  .  furosemide (LASIX) 40 MG tablet, Take 20 mg by mouth daily. , Disp: , Rfl:  .  insulin aspart protamine - aspart (NOVOLOG MIX 70/30 FLEXPEN) (70-30) 100 UNIT/ML FlexPen, Inject 11-20 Units into the skin See admin instructions. SLIDING SCALE, Disp: , Rfl:  .  Insulin Degludec (TRESIBA [place]), Inject 80 Units into the skin., Disp: , Rfl:  .  losartan (COZAAR) 50 MG tablet, Take by mouth., Disp: , Rfl:  .  OZEMPIC 0.25 or 0.5 MG/DOSE SOPN, Inject 0.5 mg into the skin once a week., Disp: , Rfl: 5 .  potassium chloride (K-DUR) 10 MEQ tablet, Take 10 mEq by mouth daily., Disp: , Rfl: 0 .  silver sulfADIAZINE (SILVADENE) 1 % cream, Apply pea-sized amount to wound daily., Disp: 50 g, Rfl: 0  Social History   Tobacco Use  Smoking Status Never Smoker  Smokeless Tobacco Never Used    Allergies  Allergen Reactions  . Other    Objective:   There were no vitals filed for this visit. There is no height or weight on file to calculate BMI. Constitutional Well developed. Well  nourished.  Vascular Dorsalis pedis pulses palpable bilaterally. Posterior tibial pulses palpable bilaterally. Capillary refill normal to all digits.  No cyanosis or clubbing noted. Pedal hair growth normal.  Neurologic Normal speech. Oriented to person, place, and time. Protective sensation absent  Dermatologic Wound Location: L heel Wound Base: Granular/Healthy Peri-wound: Macerated, Calloused Exudate: Moderate amount Serosanguinous exudate Wound Measurements: -1x1 superficial post-debridement  Wound R 1st MPJ 1.5x1.5 with wholly granular base, no warmth or erythema, no signs of acute infection.  Orthopedic: No pain to palpation either foot.   Radiographs: None today Assessment:   1. 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 Diabetic ulcer of other part of right foot associated with diabetes mellitus due to underlying condition, limited to breakdown of skin [facility])    Plan:  Patient was evaluated and treated and all questions answered.  Ulcer L Heel -Debridement as below. -Dressed with medihoney, DSD. -Continue off-loading with diabetic insert.  Procedure: Excisional Debridement of Wound Rationale: Removal of non-viable soft tissue from the wound to promote healing.  Anesthesia: none Pre-Debridement Wound Measurements: 0.7 cm x 0.7 cm x 0.5 cm  Post-Debridement Wound Measurements: 1 cm x 1 cm x 0.5 cm  Type of Debridement: Sharp Excisional Tissue Removed: Non-viable  soft tissue Depth of Debridement: subcutaneous tissue. Technique: Sharp excisional debridement to bleeding, viable wound base.  Dressing: Dry, sterile, compression dressing. Disposition: Patient tolerated procedure well. Patient to return in 1 week for follow-up.  Ulcer R 1st MPJ -Cauterized with silver nitrate -Dressed with medihoney and DSD. -Diabetic insert offloaded R.  Procedure: Chemical Cauterization of Granulation  Tissue Rationale: Cauterize granular wound base to promote healing.  Wound Measurements: 1 cm x 1 cm x superficial  Instrumentation: Silver nitrate stick x3 Dressing: Dry, sterile, compression dressing. Disposition: Patient tolerated procedure well. Patient to return in 1 week for follow-up.     Return in about 1 week (around 10/16/2018) for Wound Care, Bilateral.

## 2018-10-09 NOTE — Progress Notes (Signed)
Subjective:  Patient ID: [name], male    DOB: 1956/08/05,  MRN: [id number]  Chief Complaint  Patient presents with  . Foot Ulcer    F/U L heel ulcer Pt. stated,' the dead skin is coming back and there still is some bleeding, no pain at all." -w/ bloody draiange/ pt denies N/v/f/Ch   . Skin Problem    R sub met 1 x 1 wk; no pain Tx: dressing -w/ bloody drainage    62 y.o. male presents for wound care.  Above history confirmed with patient  Review of Systems: Negative except as noted in the HPI. Denies N/V/F/Ch.  Past Medical History:  Diagnosis Date  . Diabetic foot ulcers ([place])   . DM2 (diabetes mellitus, type 2) ([place])   . HTN (hypertension)     Current Outpatient Medications:  .  aspirin EC 81 MG tablet, Take 81 mg by mouth daily. , Disp: , Rfl:  .  atorvastatin (LIPITOR) 10 MG tablet, Take by mouth., Disp: , Rfl:  .  clotrimazole-betamethasone (LOTRISONE) cream, Apply fingertip amount to bottom of both feet daily., Disp: 30 g, Rfl: 0 .  EPINEPHrine (EPIPEN 2-PAK) 0.3 mg/0.3 mL IJ SOAJ injection, EpiPen 2-Pak 0.3 mg/0.3 mL injection, auto-injector, Disp: , Rfl:  .  furosemide (LASIX) 40 MG tablet, Take 20 mg by mouth daily. , Disp: , Rfl:  .  insulin aspart protamine - aspart (NOVOLOG MIX 70/30 FLEXPEN) (70-30) 100 UNIT/ML FlexPen, Inject 11-20 Units into the skin See admin instructions. SLIDING SCALE, Disp: , Rfl:  .  Insulin Degludec (TRESIBA [place]), Inject 80 Units into the skin., Disp: , Rfl:  .  losartan (COZAAR) 50 MG tablet, Take by mouth., Disp: , Rfl:  .  OZEMPIC 0.25 or 0.5 MG/DOSE SOPN, Inject 0.5 mg into the skin once a week., Disp: , Rfl: 5 .  potassium chloride (K-DUR) 10 MEQ tablet, Take 10 mEq by mouth daily., Disp: , Rfl: 0 .  silver sulfADIAZINE (SILVADENE) 1 % cream, Apply pea-sized amount to wound daily., Disp: 50 g, Rfl: 0  Social History   Tobacco Use  Smoking Status Never Smoker  Smokeless Tobacco Never Used    Allergies  Allergen Reactions  .  Other    Objective:   Vitals:   10/02/18 0839  BP: (!) 143/83  Pulse: 88  Resp: 16  Temp: 98.5 F (36.9 C)   There is no height or weight on file to calculate BMI. Constitutional Well developed. Well nourished.  Vascular Dorsalis pedis pulses palpable bilaterally. Posterior tibial pulses palpable bilaterally. Capillary refill normal to all digits.  No cyanosis or clubbing noted. Pedal hair growth normal.  Neurologic Normal speech. Oriented to person, place, and time. Protective sensation absent  Dermatologic Wound Location: L heel Wound Base: Granular/Healthy Peri-wound: Macerated, Calloused Exudate: Moderate amount Serosanguinous exudate Wound Measurements: -1.5 x 1.5 post-dbridement   Right first MPJ ulceration with granular wound base measuring 2 x 2 without probe to bone no warmth no erythema no signs of acute infection.     Orthopedic: No pain to palpation either foot.   Radiographs: None today Assessment:   1. 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 Diabetic ulcer of other part of right foot associated with diabetes mellitus due to underlying condition, limited to breakdown of skin [facility])    Plan:  Patient was evaluated and treated and all questions answered.  Ulcer L Heel -Debridement as below. -  Dressed with Prisma, DSD. -Continue off-loading with surgical shoe. -Rx Silvadene to be used after completion of Prisma  Procedure: Excisional Debridement of Wound Rationale: Removal of non-viable soft tissue from the wound to promote healing.  Anesthesia: none Pre-Debridement Wound Measurements: 0.5 cm x 0.5 cm   Post-Debridement Wound Measurements: 1.5 cm x 1.5 cm  Type of Debridement: Sharp Excisional Tissue Removed: Non-viable soft tissue Depth of Debridement: subcutaneous tissue. Technique: Sharp excisional debridement to bleeding, viable wound base.  Dressing: Dry, sterile,  compression dressing. Disposition: Patient tolerated procedure well. Patient to return in 1 week for follow-up.  Right first MPJ ulceration -Dressed with Silvadene -Apply Silvadene daily -No debridement performed today  Return in about 1 week (around 10/09/2018) for Wound Care, Bilateral.

## 2018-10-10 ENCOUNTER — Ambulatory Visit: Payer: BC Managed Care – PPO | Admitting: Podiatry

## 2018-10-16 ENCOUNTER — Ambulatory Visit: Payer: BC Managed Care – PPO | Admitting: Podiatry

## 2018-10-23 ENCOUNTER — Encounter: Payer: Self-pay | Admitting: Podiatry

## 2018-10-23 ENCOUNTER — Ambulatory Visit: Payer: BC Managed Care – PPO | Admitting: Podiatry

## 2018-10-23 VITALS — BP 140/80 | HR 87 | Temp 97.9°F | Resp 16

## 2018-10-23 DIAGNOSIS — E08621 Diabetes mellitus due to underlying condition with foot ulcer: Secondary | ICD-10-CM | POA: Diagnosis not present

## 2018-10-23 DIAGNOSIS — E11621 Type 2 diabetes mellitus with foot ulcer: Secondary | ICD-10-CM

## 2018-10-23 DIAGNOSIS — L928 Other granulomatous disorders of the skin and subcutaneous tissue: Secondary | ICD-10-CM

## 2018-10-23 DIAGNOSIS — L97511 Non-pressure chronic ulcer of other part of right foot limited to breakdown of skin: Secondary | ICD-10-CM

## 2018-10-23 DIAGNOSIS — L97421 Non-pressure chronic ulcer of left heel and midfoot limited to breakdown of skin: Secondary | ICD-10-CM

## 2018-10-23 NOTE — Progress Notes (Signed)
Subjective:  Patient ID: [name], male    DOB: [birth year],  MRN: [id number]  Chief Complaint  Patient presents with  . Foot Ulcer    F/U L heel and R 1st MPJ ulcers: Pt. states," some progress on the L heel, it's getting smaller as well as the R ulcer it's getting smaller; no pain at all." Tx: sivladene and coflex -w/ light yellow discharge -pt denies N/V/F?Ch -FBS: 168 x 1 day A1C: na PCP: Gunner   62 y.o. male presents for wound care. History as above.  Review of Systems: Negative except as noted in the HPI. Denies N/V/F/Ch.  Past Medical History:  Diagnosis Date  . Diabetic foot ulcers (HCC)   . DM2 (diabetes mellitus, type 2) (HCC)   . HTN (hypertension)     Current Outpatient Medications:  .  aspirin EC 81 MG tablet, Take 81 mg by mouth daily. , Disp: , Rfl:  .  atorvastatin (LIPITOR) 10 MG tablet, Take by mouth., Disp: , Rfl:  .  clotrimazole-betamethasone (LOTRISONE) cream, Apply fingertip amount to bottom of both feet daily., Disp: 30 g, Rfl: 0 .  EPINEPHrine (EPIPEN 2-PAK) 0.3 mg/0.3 mL IJ SOAJ injection, EpiPen 2-Pak 0.3 mg/0.3 mL injection, auto-injector, Disp: , Rfl:  .  furosemide (LASIX) 40 MG tablet, Take 20 mg by mouth daily. , Disp: , Rfl:  .  insulin aspart protamine - aspart (NOVOLOG MIX 70/30 FLEXPEN) (70-30) 100 UNIT/ML FlexPen, Inject 11-20 Units into the skin See admin instructions. SLIDING SCALE, Disp: , Rfl:  .  Insulin Degludec (TRESIBA [place]), Inject 80 Units into the skin., Disp: , Rfl:  .  losartan (COZAAR) 50 MG tablet, Take by mouth., Disp: , Rfl:  .  OZEMPIC 0.25 or 0.5 MG/DOSE SOPN, Inject 0.5 mg into the skin once a week., Disp: , Rfl: 5 .  potassium chloride (K-DUR) 10 MEQ tablet, Take 10 mEq by mouth daily., Disp: , Rfl: 0 .  silver sulfADIAZINE (SILVADENE) 1 % cream, Apply pea-sized amount to wound daily., Disp: 50 g, Rfl: 0  Social History   Tobacco Use  Smoking Status Never Smoker  Smokeless Tobacco Never Used    Allergies  Allergen  Reactions  . Other    Objective:   Vitals:   10/23/18 1007  BP: 140/80  Pulse: 87  Resp: 16  Temp: 97.9 F (36.6 C)   There is no height or weight on file to calculate BMI. Constitutional Well developed. Well nourished.  Vascular Dorsalis pedis pulses palpable bilaterally. Posterior tibial pulses palpable bilaterally. Capillary refill normal to all digits.  No cyanosis or clubbing noted. Pedal hair growth normal.  Neurologic Normal speech. Oriented to person, place, and time. Protective sensation absent  Dermatologic Wound Location: L heel Wound Base: Granular/Healthy Peri-wound: Macerated, Calloused Exudate: Moderate amount Serosanguinous exudate Wound Measurements: -1.5 x 1.5 post-debridement    Wound R 1st MPJ 1.5x1.5 with wholly granular base, no warmth or erythema, no signs of acute infection.   Orthopedic: No pain to palpation either foot.   Radiographs: None today Assessment:   1. 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 Diabetic ulcer of other part of right foot associated with diabetes mellitus due to underlying condition, limited to breakdown of skin (HCC)   3. Other granulomatous disorders of the skin and subcutaneous tissue    Plan:  Patient was evaluated and treated and all questions answered.  Ulcer L Heel -Debridement as  below. -Dressed with medihoney, DSD. -Continue off-loading with diabetic insert.  Procedure: Excisional Debridement of Wound Rationale: Removal of non-viable soft tissue from the wound to promote healing.  Anesthesia: none Pre-Debridement Wound Measurements: 1 cm x 1 cm x 0.2 cm  Post-Debridement Wound Measurements: 1.5 cm x 1.5 cm x 0.2 cm  Type of Debridement: Sharp Excisional Tissue Removed: Non-viable soft tissue Depth of Debridement: subcutaneous tissue. Technique: Sharp excisional debridement to bleeding, viable wound base.  Dressing: Dry, sterile,  compression dressing. Disposition: Patient tolerated procedure well. Patient to return in 1 week for follow-up.   Ulcer R 1st MPJ -Cauterized with silver nitrate -Dressed with medihoney and DSD. -Diabetic insert offloaded R.  Procedure: Chemical Cauterization of Granulation Tissue Rationale: Cauterize granular wound base to promote healing.  Wound Measurements: 1 cm x 1 cm x 0.1 cm  Instrumentation: Silver nitrate stick x3 Dressing: Dry, sterile, compression dressing. Disposition: Patient tolerated procedure well. Patient to return in 1 week for follow-up.  Return in about 2 weeks (around 11/06/2018) for Wound Care, Bilateral.

## 2018-11-06 ENCOUNTER — Ambulatory Visit: Payer: BC Managed Care – PPO | Admitting: Podiatry

## 2018-11-06 ENCOUNTER — Encounter: Payer: Self-pay | Admitting: Podiatry

## 2018-11-06 VITALS — BP 120/70 | HR 89 | Resp 16

## 2018-11-06 DIAGNOSIS — E08621 Diabetes mellitus due to underlying condition with foot ulcer: Secondary | ICD-10-CM

## 2018-11-06 DIAGNOSIS — L97421 Non-pressure chronic ulcer of left heel and midfoot limited to breakdown of skin: Secondary | ICD-10-CM | POA: Diagnosis not present

## 2018-11-06 DIAGNOSIS — L97511 Non-pressure chronic ulcer of other part of right foot limited to breakdown of skin: Secondary | ICD-10-CM

## 2018-11-06 DIAGNOSIS — E11621 Type 2 diabetes mellitus with foot ulcer: Secondary | ICD-10-CM

## 2018-11-06 NOTE — Progress Notes (Signed)
Subjective:  Patient ID: [name], male    DOB: Feb 02, 1956,  MRN: [id number]  Chief Complaint  Patient presents with  . Foot Ulcer    F/U R 1st MPJ and L heel ulcers Pt. states," they both healing, they look good, no pain at all." tx: silvadene and bandaid -w/ small amount of yello drainage -pt deneis N/V/F/CH -FBS: 186 x 2 days A1C: na PCP: Gunner   [age] y.o. male presents for wound care. History as above.  Review of Systems: Negative except as noted in the HPI. Denies N/V/F/Ch.  Past Medical History:  Diagnosis Date  . Diabetic foot ulcers (HCC)   . DM2 (diabetes mellitus, type 2) (HCC)   . HTN (hypertension)     Current Outpatient Medications:  .  aspirin EC 81 MG tablet, Take 81 mg by mouth daily. , Disp: , Rfl:  .  atorvastatin (LIPITOR) 10 MG tablet, Take by mouth., Disp: , Rfl:  .  clotrimazole-betamethasone (LOTRISONE) cream, Apply fingertip amount to bottom of both feet daily., Disp: 30 g, Rfl: 0 .  EPINEPHrine (EPIPEN 2-PAK) 0.3 mg/0.3 mL IJ SOAJ injection, EpiPen 2-Pak 0.3 mg/0.3 mL injection, auto-injector, Disp: , Rfl:  .  furosemide (LASIX) 40 MG tablet, Take 20 mg by mouth daily. , Disp: , Rfl:  .  insulin aspart protamine - aspart (NOVOLOG MIX 70/30 FLEXPEN) (70-30) 100 UNIT/ML FlexPen, Inject 11-20 Units into the skin See admin instructions. SLIDING SCALE, Disp: , Rfl:  .  Insulin Degludec (TRESIBA [place]), Inject 80 Units into the skin., Disp: , Rfl:  .  losartan (COZAAR) 50 MG tablet, Take by mouth., Disp: , Rfl:  .  OZEMPIC 0.25 or 0.5 MG/DOSE SOPN, Inject 0.5 mg into the skin once a week., Disp: , Rfl: 5 .  potassium chloride (K-DUR) 10 MEQ tablet, Take 10 mEq by mouth daily., Disp: , Rfl: 0 .  silver sulfADIAZINE (SILVADENE) 1 % cream, Apply pea-sized amount to wound daily., Disp: 50 g, Rfl: 0  Social History   Tobacco Use  Smoking Status Never Smoker  Smokeless Tobacco Never Used    Allergies  Allergen Reactions  . Other    Objective:   Vitals:   11/06/18 0954  BP: 120/70  Pulse: 89  Resp: 16   There is no height or weight on file to calculate BMI. Constitutional Well developed. Well nourished.  Vascular Dorsalis pedis pulses palpable bilaterally. Posterior tibial pulses palpable bilaterally. Capillary refill normal to all digits.  No cyanosis or clubbing noted. Pedal hair growth normal.  Neurologic Normal speech. Oriented to person, place, and time. Protective sensation absent  Dermatologic Wound Location: L heel Wound Base: Granular/Healthy Peri-wound: Macerated, Calloused Exudate: Moderate amount Serosanguinous exudate Wound Measurements: -1.5 x 1.5 post-debridement  Wound R 1st MPJ 1x1 with wholly granular base, no warmth or erythema, no signs of acute infection.   Orthopedic: No pain to palpation either foot.   Radiographs: None today Assessment:   1. 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 Diabetic ulcer of other part of right foot associated with diabetes mellitus due to underlying condition, limited to breakdown of skin [facility](HCC)    Plan:  Patient was evaluated and treated and all questions answered.  Ulcer L Heel -Debridement as below. -Dressed with medihoney, DSD.  Procedure: Excisional Debridement of Wound Rationale: Removal of non-viable soft tissue from the wound to promote healing.  Anesthesia: none Pre-Debridement Wound Measurements: 0.5 cm  x 1 cm x 0.1 cm  Post-Debridement Wound Measurements: 0.8 cm x 1 cm x 0.1 cm  Type of Debridement: Sharp Excisional Tissue Removed: Non-viable soft tissue Depth of Debridement: subcutaneous tissue. Technique: Sharp excisional debridement to bleeding, viable wound base.  Dressing: Dry, sterile, compression dressing. Disposition: Patient tolerated procedure well. Patient to return in 1 week for follow-up.    Ulcer R 1st MPJ -Debrided as below. -Dressed with medihoney and DSD.  Procedure:  Excisional Debridement of Wound Rationale: Removal of non-viable soft tissue from the wound to promote healing.  Anesthesia: none Pre-Debridement Wound Measurements: 0.8 cm x 0.8 cm x 0.1 cm  Post-Debridement Wound Measurements: 1 cm x 1 cm x 0.1 cm  Type of Debridement: Sharp Excisional Tissue Removed: Non-viable soft tissue Depth of Debridement: subcutaneous tissue. Technique: Sharp excisional debridement to bleeding, viable wound base.  Dressing: Dry, sterile, compression dressing. Disposition: Patient tolerated procedure well. Patient to return in 1 week for follow-up.     Return in about 2 weeks (around 11/20/2018) for Wound Care, Bilateral.

## 2018-11-20 ENCOUNTER — Ambulatory Visit: Payer: BC Managed Care – PPO | Admitting: Podiatry

## 2018-11-27 ENCOUNTER — Ambulatory Visit: Payer: BC Managed Care – PPO | Admitting: Podiatry

## 2018-11-28 ENCOUNTER — Encounter: Payer: Self-pay | Admitting: Podiatry

## 2018-11-28 ENCOUNTER — Ambulatory Visit: Payer: BC Managed Care – PPO | Admitting: Podiatry

## 2018-11-28 VITALS — BP 165/91 | HR 92 | Temp 97.8°F | Resp 16

## 2018-11-28 DIAGNOSIS — E11621 Type 2 diabetes mellitus with foot ulcer: Secondary | ICD-10-CM | POA: Diagnosis not present

## 2018-11-28 DIAGNOSIS — L97511 Non-pressure chronic ulcer of other part of right foot limited to breakdown of skin: Secondary | ICD-10-CM

## 2018-11-28 DIAGNOSIS — L97421 Non-pressure chronic ulcer of left heel and midfoot limited to breakdown of skin: Secondary | ICD-10-CM

## 2018-11-28 DIAGNOSIS — Z91199 Patient's noncompliance with other medical treatment and regimen due to unspecified reason: Secondary | ICD-10-CM

## 2018-11-28 DIAGNOSIS — E08621 Diabetes mellitus due to underlying condition with foot ulcer: Secondary | ICD-10-CM

## 2018-11-28 DIAGNOSIS — Z9119 Patient's noncompliance with other medical treatment and regimen: Secondary | ICD-10-CM

## 2018-11-28 NOTE — Progress Notes (Signed)
Subjective:  Patient ID: [name], male    DOB: [birth year],  MRN: [id number]  Chief Complaint  Patient presents with  . Ulcer    F/U BL ulcers Pt. states," the R one seems to be doing better, the L one it still looks the same, but no pain at all." Tx: bandaind -pt denies N/V/F?CH =w/ clar yellow draiange -FBS: 180  x Thurs   62 y.o. male presents for wound care. History as above. States he has not been dressing the one on the right side.  Review of Systems: Negative except as noted in the HPI. Denies N/V/F/Ch.  Past Medical History:  Diagnosis Date  . Diabetic foot ulcers (HCC)   . DM2 (diabetes mellitus, type 2) (HCC)   . HTN (hypertension)     Current Outpatient Medications:  .  aspirin EC 81 MG tablet, Take 81 mg by mouth daily. , Disp: , Rfl:  .  atorvastatin (LIPITOR) 10 MG tablet, Take by mouth., Disp: , Rfl:  .  clotrimazole-betamethasone (LOTRISONE) cream, Apply fingertip amount to bottom of both feet daily., Disp: 30 g, Rfl: 0 .  EPINEPHrine (EPIPEN 2-PAK) 0.3 mg/0.3 mL IJ SOAJ injection, EpiPen 2-Pak 0.3 mg/0.3 mL injection, auto-injector, Disp: , Rfl:  .  furosemide (LASIX) 40 MG tablet, Take 20 mg by mouth daily. , Disp: , Rfl:  .  insulin aspart protamine - aspart (NOVOLOG MIX 70/30 FLEXPEN) (70-30) 100 UNIT/ML FlexPen, Inject 11-20 Units into the skin See admin instructions. SLIDING SCALE, Disp: , Rfl:  .  Insulin Degludec (TRESIBA [place]), Inject 80 Units into the skin., Disp: , Rfl:  .  losartan (COZAAR) 50 MG tablet, Take by mouth., Disp: , Rfl:  .  OZEMPIC 0.25 or 0.5 MG/DOSE SOPN, Inject 0.5 mg into the skin once a week., Disp: , Rfl: 5 .  potassium chloride (K-DUR) 10 MEQ tablet, Take 10 mEq by mouth daily., Disp: , Rfl: 0 .  silver sulfADIAZINE (SILVADENE) 1 % cream, Apply pea-sized amount to wound daily., Disp: 50 g, Rfl: 0  Social History   Tobacco Use  Smoking Status Never Smoker  Smokeless Tobacco Never Used    Allergies  Allergen Reactions  .  Other    Objective:   Vitals:   11/28/18 1106  BP: (!) 165/91  Pulse: 92  Resp: 16  Temp: 97.8 F (36.6 C)   There is no height or weight on file to calculate BMI. Constitutional Well developed. Well nourished.  Vascular Dorsalis pedis pulses palpable bilaterally. Posterior tibial pulses palpable bilaterally. Capillary refill normal to all digits.  No cyanosis or clubbing noted. Pedal hair growth normal.  Neurologic Normal speech. Oriented to person, place, and time. Protective sensation absent  Dermatologic Wound Location: L heel Wound Base: Granular/Healthy Peri-wound: Macerated, Calloused Exudate: Moderate amount Serosanguinous exudate Wound Measurements: -1x0.5 post-debridement  Wound R 1st MPJ 1x1 with fibrogranular base, no warmth or erythema, no signs of acute infection. Significant maceration Foreign body present in wound.   Orthopedic: No pain to palpation either foot.   Radiographs: None today Assessment:   1. 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 Diabetic ulcer of other part of right foot associated with diabetes mellitus due to underlying condition, limited to breakdown of skin (HCC)   3. Non-compliance    Plan:  Patient was evaluated and treated and all questions answered.  Ulcer L Heel -Debridement as below. -Dressed with medihoney, DSD. -Order  supplied including silicone bordered foam dressing  Procedure: Excisional Debridement of Wound Rationale: Removal of non-viable soft tissue from the wound to promote healing.  Anesthesia: none Pre-Debridement Wound Measurements: 0.5 cm x 0.5 cm x 0.2 cm  Post-Debridement Wound Measurements: 1 cm x 1 cm x 0.3 cm  Type of Debridement: Sharp Excisional Tissue Removed: Non-viable soft tissue Depth of Debridement: subcutaneous tissue. Technique: Sharp excisional debridement to bleeding, viable wound base.  Dressing: Dry, sterile,  compression dressing. Disposition: Patient tolerated procedure well. Patient to return in 1 week for follow-up.  Ulcer R 1st MPJ -Debrided as below. -Dressed with medihoney and DSD. -Discussed importance of compliance with dressing daily and with wearing of diabetic inserts to appropriately off-load the wound. Discussed that the foreign body in the wound today can cause infection and it is important that the wound stay clean, dry, and covered at all times. 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  Procedure: Excisional Debridement of Wound Rationale: Removal of non-viable soft tissue from the wound to promote healing.  Anesthesia: none Pre-Debridement Wound Measurements:  0.5cm x 0.5 cm x 0.3 cm  Post-Debridement Wound Measurements: 1 cm x 0.5 cm x 0.3 cm  Type of Debridement: Sharp Excisional Tissue Removed: Non-viable soft tissue Depth of Debridement: subcutaneous tissue. Technique: Sharp excisional debridement to bleeding, viable wound base.  Dressing: Dry, sterile, compression dressing. Disposition: Patient tolerated procedure well. Patient to return in 1 week for follow-up.     Return in about 2 weeks (around 12/12/2018) for Wound Care, Bilateral.

## 2018-12-12 ENCOUNTER — Ambulatory Visit: Payer: BC Managed Care – PPO | Admitting: Podiatry

## 2018-12-19 ENCOUNTER — Encounter: Payer: Self-pay | Admitting: Podiatry

## 2018-12-19 ENCOUNTER — Ambulatory Visit: Payer: BC Managed Care – PPO | Admitting: Podiatry

## 2018-12-19 VITALS — BP 147/85 | HR 85 | Temp 96.9°F | Resp 16

## 2018-12-19 DIAGNOSIS — L03031 Cellulitis of right toe: Secondary | ICD-10-CM | POA: Diagnosis not present

## 2018-12-19 DIAGNOSIS — L97421 Non-pressure chronic ulcer of left heel and midfoot limited to breakdown of skin: Secondary | ICD-10-CM

## 2018-12-19 DIAGNOSIS — E11621 Type 2 diabetes mellitus with foot ulcer: Secondary | ICD-10-CM | POA: Diagnosis not present

## 2018-12-19 DIAGNOSIS — E08621 Diabetes mellitus due to underlying condition with foot ulcer: Secondary | ICD-10-CM | POA: Diagnosis not present

## 2018-12-19 DIAGNOSIS — L02611 Cutaneous abscess of right foot: Secondary | ICD-10-CM | POA: Diagnosis not present

## 2018-12-19 DIAGNOSIS — L97511 Non-pressure chronic ulcer of other part of right foot limited to breakdown of skin: Secondary | ICD-10-CM | POA: Diagnosis not present

## 2018-12-19 MED ORDER — CLINDAMYCIN HCL 150 MG PO CAPS: 150.0000 mg | ORAL_CAPSULE | Freq: Two times a day (BID) | ORAL | 0 refills | Status: DC

## 2018-12-26 ENCOUNTER — Ambulatory Visit: Payer: BC Managed Care – PPO | Admitting: Podiatry

## 2018-12-26 ENCOUNTER — Encounter: Payer: Self-pay | Admitting: Podiatry

## 2018-12-26 VITALS — BP 158/85 | HR 91 | Temp 98.8°F | Resp 16

## 2018-12-26 DIAGNOSIS — L97421 Non-pressure chronic ulcer of left heel and midfoot limited to breakdown of skin: Secondary | ICD-10-CM | POA: Diagnosis not present

## 2018-12-26 DIAGNOSIS — B353 Tinea pedis: Secondary | ICD-10-CM

## 2018-12-26 DIAGNOSIS — E08621 Diabetes mellitus due to underlying condition with foot ulcer: Secondary | ICD-10-CM

## 2018-12-26 DIAGNOSIS — L97511 Non-pressure chronic ulcer of other part of right foot limited to breakdown of skin: Secondary | ICD-10-CM

## 2018-12-26 DIAGNOSIS — E11621 Type 2 diabetes mellitus with foot ulcer: Secondary | ICD-10-CM | POA: Diagnosis not present

## 2018-12-26 MED ORDER — FLUCONAZOLE 150 MG PO TABS: 150.0000 mg | ORAL_TABLET | ORAL | 0 refills | Status: DC

## 2018-12-26 NOTE — Progress Notes (Signed)
Subjective:  Patient ID: [name], male    DOB: Apr 25, 1956,  MRN: [id number]  Chief Complaint  Patient presents with  . Foot Ulcer    F/U R 1st MPJ and L heel ulcer check Pt. states," L seems better than the R ulcer, but no pain at all on either one." Tx: silicone foam dressing -pt denies N/V/FCH -w/ clear yellow drainage    63 y.o. male presents for wound care. History as above.  Review of Systems: Negative except as noted in the HPI. Denies N/V/F/Ch.  Past Medical History:  Diagnosis Date  . Diabetic foot ulcers (HCC)   . DM2 (diabetes mellitus, type 2) (HCC)   . HTN (hypertension)     Current Outpatient Medications:  .  aspirin EC 81 MG tablet, Take 81 mg by mouth daily. , Disp: , Rfl:  .  atorvastatin (LIPITOR) 10 MG tablet, Take by mouth., Disp: , Rfl:  .  clindamycin (CLEOCIN) 150 MG capsule, Take 1 capsule (150 mg total) by mouth 2 (two) times daily., Disp: 14 capsule, Rfl: 0 .  clotrimazole-betamethasone (LOTRISONE) cream, Apply fingertip amount to bottom of both feet daily., Disp: 30 g, Rfl: 0 .  EPINEPHrine (EPIPEN 2-PAK) 0.3 mg/0.3 mL IJ SOAJ injection, EpiPen 2-Pak 0.3 mg/0.3 mL injection, auto-injector, Disp: , Rfl:  .  furosemide (LASIX) 40 MG tablet, Take 20 mg by mouth daily. , Disp: , Rfl:  .  insulin aspart protamine - aspart (NOVOLOG MIX 70/30 FLEXPEN) (70-30) 100 UNIT/ML FlexPen, Inject 11-20 Units into the skin See admin instructions. SLIDING SCALE, Disp: , Rfl:  .  Insulin Degludec (TRESIBA [place]), Inject 80 Units into the skin., Disp: , Rfl:  .  losartan (COZAAR) 50 MG tablet, Take by mouth., Disp: , Rfl:  .  OZEMPIC 0.25 or 0.5 MG/DOSE SOPN, Inject 0.5 mg into the skin once a week., Disp: , Rfl: 5 .  potassium chloride (K-DUR) 10 MEQ tablet, Take 10 mEq by mouth daily., Disp: , Rfl: 0 .  silver sulfADIAZINE (SILVADENE) 1 % cream, Apply pea-sized amount to wound daily., Disp: 50 g, Rfl: 0 .  fluconazole (DIFLUCAN) 150 MG tablet, Take 1 tablet (150 mg total)  by mouth once a week., Disp: 3 tablet, Rfl: 0  Social History   Tobacco Use  Smoking Status Never Smoker  Smokeless Tobacco Never Used    Allergies  Allergen Reactions  . Other    Objective:   Vitals:   12/26/18 1104  BP: (!) 158/85  Pulse: 91  Resp: 16  Temp: 98.8 F (37.1 C)   There is no height or weight on file to calculate BMI. Constitutional Well developed. Well nourished.  Vascular Dorsalis pedis pulses palpable bilaterally. Posterior tibial pulses palpable bilaterally. Capillary refill normal to all digits.  No cyanosis or clubbing noted. Pedal hair growth normal.  Neurologic Normal speech. Oriented to person, place, and time. Protective sensation absent  Dermatologic Wound Location: L heel Wound Base: Granular/Healthy Peri-wound: Macerated, Calloused Exudate: Moderate amount Serosanguinous exudate Wound Measurements: -1x1 post-debridement  Wound R 1st MPJ 1x1 with fibrogranular base, no warmth or erythema, no signs of acute infection. Significant maceration Foreign body present in wound.     Xerotic scaling plantar foot bilat.  Orthopedic: No pain to palpation either foot.   Radiographs: None today Assessment:   1. 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 Diabetic ulcer of other part of right foot associated with  diabetes mellitus due to underlying condition, limited to breakdown of skin [facility])    Plan:  Patient was evaluated and treated and all questions answered.  Ulcer L Heel -Debridement as below. -Dressed with silver border dressing.   Procedure: Excisional Debridement of Wound Rationale: Removal of non-viable soft tissue from the wound to promote healing.  Anesthesia: none Pre-Debridement Wound Measurements: 0.5 cm x 0.5 cm x 0.1 cm  Post-Debridement Wound Measurements: 1 cm x 1 cm x 0.1 cm  Type of Debridement: Sharp Excisional Tissue Removed: Non-viable soft  tissue Depth of Debridement: subcutaneous tissue. Technique: Sharp excisional debridement to bleeding, viable wound base.  Dressing: Dry, sterile, compression dressing. Disposition: Patient tolerated procedure well. Patient to return in 1 week for follow-up.   Ulcer R 1st MPJ -Debrided as below. -Dressed with silver border dressing  Procedure: Excisional Debridement of Wound Rationale: Removal of non-viable soft tissue from the wound to promote healing.  Anesthesia: none Pre-Debridement Wound Measurements: 1 cm x 1 cm x 0.1 cm  Post-Debridement Wound Measurements: 1 cm x 1 cm x 0.1 cm  Type of Debridement: Sharp Excisional Tissue Removed: Non-viable soft tissue Depth of Debridement: subcutaneous tissue. Technique: Sharp excisional debridement to bleeding, viable wound base.  Dressing: Dry, sterile, compression dressing. Disposition: Patient tolerated procedure well. Patient to return in 1 week for follow-up.  Tinea Pedis -Failed topical, trial oral option.  -Rx fluconazole weekly x3 weeks. -Continue topical regimen.  Return in about 2 weeks (around 01/09/2019).

## 2018-12-31 NOTE — Progress Notes (Signed)
Subjective:  Patient ID: [name], male    DOB: January 07, 1956,  MRN: [id number]  Chief Complaint  Patient presents with  . Foot Ulcer    F/U L heel ulcer check Pt.s tates," aobut he same, but w/ some bloody drainage." tx: silver dressing  . Foot Ulcer    F?U R 1st MPJ ulcer Pt. states," no pain, but I cant' tell a difference." Tx: silver dresing -pt denies N/V/F/Ch -prev case of L leg cellulits-pt was seen by MD and was rx an abx   63 y.o. male presents for wound care. History as above.  Review of Systems: Negative except as noted in the HPI. Denies N/V/F/Ch.  Past Medical History:  Diagnosis Date  . Diabetic foot ulcers (HCC)   . DM2 (diabetes mellitus, type 2) (HCC)   . HTN (hypertension)     Current Outpatient Medications:  .  aspirin EC 81 MG tablet, Take 81 mg by mouth daily. , Disp: , Rfl:  .  atorvastatin (LIPITOR) 10 MG tablet, Take by mouth., Disp: , Rfl:  .  clotrimazole-betamethasone (LOTRISONE) cream, Apply fingertip amount to bottom of both feet daily., Disp: 30 g, Rfl: 0 .  EPINEPHrine (EPIPEN 2-PAK) 0.3 mg/0.3 mL IJ SOAJ injection, EpiPen 2-Pak 0.3 mg/0.3 mL injection, auto-injector, Disp: , Rfl:  .  furosemide (LASIX) 40 MG tablet, Take 20 mg by mouth daily. , Disp: , Rfl:  .  insulin aspart protamine - aspart (NOVOLOG MIX 70/30 FLEXPEN) (70-30) 100 UNIT/ML FlexPen, Inject 11-20 Units into the skin See admin instructions. SLIDING SCALE, Disp: , Rfl:  .  Insulin Degludec (TRESIBA [name]), Inject 80 Units into the skin., Disp: , Rfl:  .  losartan (COZAAR) 50 MG tablet, Take by mouth., Disp: , Rfl:  .  OZEMPIC 0.25 or 0.5 MG/DOSE SOPN, Inject 0.5 mg into the skin once a week., Disp: , Rfl: 5 .  potassium chloride (K-DUR) 10 MEQ tablet, Take 10 mEq by mouth daily., Disp: , Rfl: 0 .  silver sulfADIAZINE (SILVADENE) 1 % cream, Apply pea-sized amount to wound daily., Disp: 50 g, Rfl: 0 .  clindamycin (CLEOCIN) 150 MG capsule, Take 1 capsule (150 mg total) by mouth 2 (two)  times daily., Disp: 14 capsule, Rfl: 0 .  fluconazole (DIFLUCAN) 150 MG tablet, Take 1 tablet (150 mg total) by mouth once a week., Disp: 3 tablet, Rfl: 0  Social History   Tobacco Use  Smoking Status Never Smoker  Smokeless Tobacco Never Used    Allergies  Allergen Reactions  . Other    Objective:   Vitals:   12/19/18 0924  BP: (!) 147/85  Pulse: 85  Resp: 16  Temp: (!) 96.9 F (36.1 C)   There is no height or weight on file to calculate BMI. Constitutional Well developed. Well nourished.  Vascular Dorsalis pedis pulses palpable bilaterally. Posterior tibial pulses palpable bilaterally. Capillary refill normal to all digits.  No cyanosis or clubbing noted. Pedal hair growth normal.  Neurologic Normal speech. Oriented to person, place, and time. Protective sensation absent  Dermatologic Wound Location: L heel Wound Base: Granular/Healthy Peri-wound: Macerated, Calloused Exudate: Moderate amount Serosanguinous exudate Wound Measurements: -1x0.5 post-debridement  Wound R 1st MPJ 1x1 with fibrogranular base, erythema, no signs of acute infection. Significant maceration   Orthopedic: No pain to palpation either foot.   Radiographs: None today Assessment:   1. 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 Diabetic ulcer  of other part of right foot associated with diabetes mellitus due to underlying condition, limited to breakdown of skin (HCC)   3. Abscess or cellulitis, toe, right    Plan:  Patient was evaluated and treated and all questions answered.  Ulcer L Heel -Debridement as below. -Dressed with medihoney, DSD. -Order supplied including silicone bordered foam dressing  Procedure: Selective Debridement of Wound Rationale: Removal of devitalized tissue from the wound to promote healing.  Pre-Debridement Wound Measurements: 1 cm x 0.5 cm x 0.1 cm  Post-Debridement Wound Measurements: same as  pre-debridement. Type of Debridement: sharp selective Tissue Removed: Devitalized soft-tissue Dressing: Dry, sterile, compression dressing. Disposition: Patient tolerated procedure well. Patient to return in 1 week for follow-up.   Ulcer R 1st MPJ -Debrided as below. -Rx clinda for cellulitis   Procedure: Selective Debridement of Wound Rationale: Removal of devitalized tissue from the wound to promote healing.  Pre-Debridement Wound Measurements: 1 cm x 1 cm  Post-Debridement Wound Measurements: same as pre-debridement. Type of Debridement: sharp selective Tissue Removed: Devitalized soft-tissue Dressing: Dry, sterile, compression dressing. Disposition: Patient tolerated procedure well. Patient to return in 1 week for follow-up.   Return in about 1 week (around 12/26/2018) for Wound Care, Bilateral.

## 2019-01-09 ENCOUNTER — Ambulatory Visit: Payer: BC Managed Care – PPO | Admitting: Podiatry

## 2019-01-15 ENCOUNTER — Ambulatory Visit: Payer: BC Managed Care – PPO | Admitting: Podiatry

## 2019-01-15 ENCOUNTER — Other Ambulatory Visit: Payer: Self-pay

## 2019-02-05 ENCOUNTER — Encounter: Payer: Self-pay | Admitting: Podiatry

## 2019-02-05 ENCOUNTER — Ambulatory Visit: Payer: BC Managed Care – PPO | Admitting: Podiatry

## 2019-02-05 ENCOUNTER — Other Ambulatory Visit: Payer: Self-pay

## 2019-02-05 VITALS — BP 138/81 | HR 73 | Temp 97.1°F | Resp 16

## 2019-02-05 DIAGNOSIS — M2141 Flat foot [pes planus] (acquired), right foot: Secondary | ICD-10-CM

## 2019-02-05 DIAGNOSIS — L97511 Non-pressure chronic ulcer of other part of right foot limited to breakdown of skin: Secondary | ICD-10-CM

## 2019-02-05 DIAGNOSIS — E11621 Type 2 diabetes mellitus with foot ulcer: Secondary | ICD-10-CM | POA: Diagnosis not present

## 2019-02-05 DIAGNOSIS — E08621 Diabetes mellitus due to underlying condition with foot ulcer: Secondary | ICD-10-CM | POA: Diagnosis not present

## 2019-02-05 DIAGNOSIS — M2142 Flat foot [pes planus] (acquired), left foot: Secondary | ICD-10-CM

## 2019-02-05 DIAGNOSIS — B351 Tinea unguium: Secondary | ICD-10-CM

## 2019-02-05 DIAGNOSIS — E1169 Type 2 diabetes mellitus with other specified complication: Secondary | ICD-10-CM

## 2019-02-05 DIAGNOSIS — E1142 Type 2 diabetes mellitus with diabetic polyneuropathy: Secondary | ICD-10-CM

## 2019-02-05 DIAGNOSIS — L97421 Non-pressure chronic ulcer of left heel and midfoot limited to breakdown of skin: Secondary | ICD-10-CM | POA: Diagnosis not present

## 2019-02-10 NOTE — Progress Notes (Signed)
Subjective:  Patient ID: [name], male    DOB: 08/23/1956,  MRN: [id number]  Chief Complaint  Patient presents with  . Foot Ulcer    F/U L ulcer of Lt heel and R 1st MPJ ulcer Pt. states," Rt ulcer is healed and the Lt ulcer still open and sore." Tx: silver patches -pt states Lt heel patch won't stay in place -pt deneis N/V/F?Ch -FBS: 135 A1C; 12   63 y.o. male presents for wound care. History as above.  Review of Systems: Negative except as noted in the HPI. Denies N/V/F/Ch.  Past Medical History:  Diagnosis Date  . Diabetic foot ulcers (HCC)   . DM2 (diabetes mellitus, type 2) (HCC)   . HTN (hypertension)     Current Outpatient Medications:  .  amoxicillin-clavulanate (AUGMENTIN) 875-125 MG tablet, Take 1 tablet (875 mg) by mouth 2 times per day for 10 days, Disp: , Rfl:  .  aspirin EC 81 MG tablet, Take 81 mg by mouth daily. , Disp: , Rfl:  .  atorvastatin (LIPITOR) 10 MG tablet, Take by mouth., Disp: , Rfl:  .  azithromycin (ZITHROMAX) 250 MG tablet, TAKE 2 TABLETS BY MOUTH ON DAY 1 THEN TAKE 1 TABLET DAILY ON DAYS 2 - 5, Disp: , Rfl:  .  clindamycin (CLEOCIN) 150 MG capsule, Take 1 capsule (150 mg total) by mouth 2 (two) times daily., Disp: 14 capsule, Rfl: 0 .  clotrimazole-betamethasone (LOTRISONE) cream, Apply fingertip amount to bottom of both feet daily., Disp: 30 g, Rfl: 0 .  Continuous Blood Gluc Sensor (FREESTYLE LIBRE 14 DAY SENSOR) MISC, Use to check blood glucose, Disp: , Rfl:  .  EPINEPHrine (EPIPEN 2-PAK) 0.3 mg/0.3 mL IJ SOAJ injection, EpiPen 2-Pak 0.3 mg/0.3 mL injection, auto-injector, Disp: , Rfl:  .  fluconazole (DIFLUCAN) 150 MG tablet, Take 1 tablet (150 mg total) by mouth once a week., Disp: 3 tablet, Rfl: 0 .  furosemide (LASIX) 40 MG tablet, Take 20 mg by mouth daily. , Disp: , Rfl:  .  insulin aspart protamine - aspart (NOVOLOG MIX 70/30 FLEXPEN) (70-30) 100 UNIT/ML FlexPen, Inject 11-20 Units into the skin See admin instructions. SLIDING SCALE,  Disp: , Rfl:  .  Insulin Degludec (TRESIBA [place]), Inject 80 Units into the skin., Disp: , Rfl:  .  losartan (COZAAR) 50 MG tablet, Take by mouth., Disp: , Rfl:  .  OZEMPIC 0.25 or 0.5 MG/DOSE SOPN, Inject 0.5 mg into the skin once a week., Disp: , Rfl: 5 .  potassium chloride (K-DUR) 10 MEQ tablet, Take 10 mEq by mouth daily., Disp: , Rfl: 0 .  silver sulfADIAZINE (SILVADENE) 1 % cream, Apply pea-sized amount to wound daily., Disp: 50 g, Rfl: 0  Social History   Tobacco Use  Smoking Status Never Smoker  Smokeless Tobacco Never Used    Allergies  Allergen Reactions  . Other    Objective:   Vitals:   02/05/19 1602  BP: 138/81  Pulse: 73  Resp: 16  Temp: (!) 97.1 F (36.2 C)   There is no height or weight on file to calculate BMI. Constitutional Well developed. Well nourished.  Vascular Dorsalis pedis pulses palpable bilaterally. Posterior tibial pulses palpable bilaterally. Capillary refill normal to all digits.  No cyanosis or clubbing noted. Pedal hair growth normal.  Neurologic Normal speech. Oriented to person, place, and time. Protective sensation absent  Dermatologic Wound Location: L heel Wound Base: Granular/Healthy Peri-wound: Macerated, Calloused Exudate: Moderate amount Serosanguinous exudate Wound Measurements: -1x1  Xerotic scaling plantar foot bilat.  Orthopedic: No pain to palpation either foot.   Radiographs: None today Assessment:   No diagnosis found. Plan:  Patient was evaluated and treated and all questions answered.  Ulcer L Heel -Debridement as below. -Dressed with silver border dressing.  -Casted for offloading diabetic insoles  Procedure: Selective Debridement of Wound Rationale: Removal of devitalized tissue from the wound to promote healing.  Pre-Debridement Wound Measurements: 1 cm x 1 cm x 0.1 cm  Post-Debridement Wound Measurements: same as pre-debridement. Type of Debridement: sharp selective Tissue Removed: Devitalized  soft-tissue Dressing: Dry, sterile, compression dressing. Disposition: Patient tolerated procedure well. Patient to return in 1 week for follow-up.     Ulcer R 1st MPJ -Epithelialized  No follow-ups on file.

## 2019-03-05 ENCOUNTER — Ambulatory Visit: Payer: BC Managed Care – PPO | Admitting: Podiatry

## 2019-03-12 ENCOUNTER — Ambulatory Visit: Payer: BC Managed Care – PPO | Admitting: Podiatry

## 2019-03-13 ENCOUNTER — Ambulatory Visit: Payer: BC Managed Care – PPO | Admitting: Podiatry

## 2019-03-20 ENCOUNTER — Other Ambulatory Visit: Payer: Self-pay

## 2019-03-20 ENCOUNTER — Encounter: Payer: Self-pay | Admitting: Podiatry

## 2019-03-20 ENCOUNTER — Ambulatory Visit: Payer: BC Managed Care – PPO | Admitting: Podiatry

## 2019-03-20 VITALS — Temp 97.9°F | Resp 16

## 2019-03-20 DIAGNOSIS — L97421 Non-pressure chronic ulcer of left heel and midfoot limited to breakdown of skin: Secondary | ICD-10-CM | POA: Diagnosis not present

## 2019-03-20 DIAGNOSIS — E08621 Diabetes mellitus due to underlying condition with foot ulcer: Secondary | ICD-10-CM | POA: Diagnosis not present

## 2019-03-20 DIAGNOSIS — B353 Tinea pedis: Secondary | ICD-10-CM

## 2019-03-20 DIAGNOSIS — M2141 Flat foot [pes planus] (acquired), right foot: Secondary | ICD-10-CM

## 2019-03-20 DIAGNOSIS — L97511 Non-pressure chronic ulcer of other part of right foot limited to breakdown of skin: Secondary | ICD-10-CM

## 2019-03-20 DIAGNOSIS — M2142 Flat foot [pes planus] (acquired), left foot: Secondary | ICD-10-CM | POA: Diagnosis not present

## 2019-03-20 DIAGNOSIS — E11621 Type 2 diabetes mellitus with foot ulcer: Secondary | ICD-10-CM

## 2019-03-20 NOTE — Progress Notes (Signed)
Subjective:  Patient ID: [name] JeffersonRonald R Hussein, male    DOB: 25-Sep-1956,  MRN: [id number]  Chief Complaint  Patient presents with  . Wound Check    F/U Lt heel ulcer and !st MPJ ulcer check Pt. states," Rt one is fine it's healed, the lt one seem slike it's healing too." Tx: OTC abx cream -w/ clear drianage -pt deneis N/V/F/Ch/redness/swelling  -FBS: 146 A1C: 9.3   63 y.o. male presents for wound care. History as above. Still having scaling lesion to the bottom of the feet.  Review of Systems: Negative except as noted in the HPI. Denies N/V/F/Ch.  Past Medical History:  Diagnosis Date  . Diabetic foot ulcers (HCC)   . DM2 (diabetes mellitus, type 2) (HCC)   . HTN (hypertension)     Current Outpatient Medications:  .  amoxicillin-clavulanate (AUGMENTIN) 875-125 MG tablet, Take 1 tablet (875 mg) by mouth 2 times per day for 10 days, Disp: , Rfl:  .  aspirin EC 81 MG tablet, Take 81 mg by mouth daily. , Disp: , Rfl:  .  atorvastatin (LIPITOR) 10 MG tablet, Take by mouth., Disp: , Rfl:  .  azithromycin (ZITHROMAX) 250 MG tablet, TAKE 2 TABLETS BY MOUTH ON DAY 1 THEN TAKE 1 TABLET DAILY ON DAYS 2 - 5, Disp: , Rfl:  .  B-D UF III MINI PEN NEEDLES 31G X 5 MM MISC, USE AS DIRECTED UP TO 3 TIMES DAILY, Disp: , Rfl:  .  cetirizine (ZYRTEC) 10 MG tablet, TAKE 1 TABLET BY MOUTH EVERY DAY FOR 2 WEEKS, Disp: , Rfl:  .  clindamycin (CLEOCIN) 150 MG capsule, Take 1 capsule (150 mg total) by mouth 2 (two) times daily., Disp: 14 capsule, Rfl: 0 .  clotrimazole-betamethasone (LOTRISONE) cream, Apply fingertip amount to bottom of both feet daily., Disp: 30 g, Rfl: 0 .  Continuous Blood Gluc Sensor (FREESTYLE LIBRE 14 DAY SENSOR) MISC, Use to check blood glucose, Disp: , Rfl:  .  EPINEPHrine (EPIPEN 2-PAK) 0.3 mg/0.3 mL IJ SOAJ injection, EpiPen 2-Pak 0.3 mg/0.3 mL injection, auto-injector, Disp: , Rfl:  .  fluconazole (DIFLUCAN) 150 MG tablet, Take 1 tablet (150 mg total) by mouth once a week., Disp: 3 tablet,  Rfl: 0 .  furosemide (LASIX) 40 MG tablet, Take 20 mg by mouth daily. , Disp: , Rfl:  .  insulin aspart protamine - aspart (NOVOLOG MIX 70/30 FLEXPEN) (70-30) 100 UNIT/ML FlexPen, Inject 11-20 Units into the skin See admin instructions. SLIDING SCALE, Disp: , Rfl:  .  Insulin Degludec (TRESIBA [place]), Inject 80 Units into the skin., Disp: , Rfl:  .  losartan (COZAAR) 50 MG tablet, Take by mouth., Disp: , Rfl:  .  NOVOLOG FLEXPEN 100 UNIT/ML FlexPen, INJECT 15 UNITS SUBCUTANEOUSLY 3 TIMES DAILY, Disp: , Rfl:  .  OZEMPIC 0.25 or 0.5 MG/DOSE SOPN, Inject 0.5 mg into the skin once a week., Disp: , Rfl: 5 .  potassium chloride (K-DUR) 10 MEQ tablet, Take 10 mEq by mouth daily., Disp: , Rfl: 0 .  predniSONE (DELTASONE) 20 MG tablet, , Disp: , Rfl:  .  silver sulfADIAZINE (SILVADENE) 1 % cream, Apply pea-sized amount to wound daily., Disp: 50 g, Rfl: 0 .  triamcinolone cream (KENALOG) 0.1 %, apply TO THE AFFECTED AREA TWICE DAILY for 1 week, Disp: , Rfl:   Social History   Tobacco Use  Smoking Status Never Smoker  Smokeless Tobacco Never Used    Allergies  Allergen Reactions  . Other    Objective:  Vitals:   03/20/19 1051  Resp: 16  Temp: 97.9 F (36.6 C)   There is no height or weight on file to calculate BMI. Constitutional Well developed. Well nourished.  Vascular Dorsalis pedis pulses palpable bilaterally. Posterior tibial pulses palpable bilaterally. Capillary refill normal to all digits.  No cyanosis or clubbing noted. Pedal hair growth normal.  Neurologic Normal speech. Oriented to person, place, and time. Protective sensation absent  Dermatologic Wound Location: L heel Wound Base: Granular/Healthy Peri-wound: Macerated, Calloused Exudate: Moderate amount Serosanguinous exudate Wound Measurements: -1x1 post-debridement.  Continued macerated, scaling plantar foot bilat.  Orthopedic: No pain to palpation either foot.   Radiographs: None today Assessment:   1.  Diabetic ulcer of other part of right foot associated with diabetes mellitus due to underlying condition, limited to breakdown of skin (HCC)   2. Diabetic ulcer of left heel associated with type 2 diabetes mellitus, limited to breakdown of skin (HCC)   3. Tinea pedis of both feet    Plan:  Patient was evaluated and treated and all questions answered.  Ulcer L Heel -Debridement as below. -Dressed with prisma and DSD. -CMOs dispensed to offload the ulceration.  Procedure: Excisional Debridement of Wound Rationale: Removal of non-viable soft tissue from the wound to promote healing.  Anesthesia: none Pre-Debridement Wound Measurements: 0.5 cm x 0.5 cm x 0.3 cm  Post-Debridement Wound Measurements: 1 cm x 1 cm x 0.3 cm  Type of Debridement: Sharp Excisional Tissue Removed: Non-viable soft tissue Depth of Debridement: subcutaneous tissue. Technique: Sharp excisional debridement to bleeding, viable wound base.  Dressing: Dry, sterile, compression dressing. Disposition: Patient tolerated procedure well. Patient to return in 1 week for follow-up.   Ulcer R 1st MPJ -Remains healed.  Tinea Pedis -Educated on hygiene -Recommend Zeesorb powder.  No follow-ups on file.

## 2019-03-20 NOTE — Patient Instructions (Addendum)
Foot Powder: Zeesorb (Athlete's foot or Jock Itch are the same formula)  WEARING INSTRUCTIONS FOR ORTHOTICS  Don't expect to be comfortable wearing your orthotic devices for the first time.  Like eyeglasses, you may be aware of them as time passes, they will not be uncomfortable and you will enjoy wearing them.  FOLLOW THESE INSTRUCTIONS EXACTLY!  1. Wear your orthotic devices for:       Not more than 1 hour the first day.       Not more than 2 hours the second day.       Not more than 3 hours the third day and so on.        Or wear them for as long as they feel comfortable.       If you experience discomfort in your feet or legs take them out.  When feet & legs feel       better, put them back in.  You do need to be consistent and wear them a little        everyday. 2.   If at any time the orthotic devices become acutely uncomfortable before the       time for that particular day, STOP WEARING THEM. 3.   On the next day, do not increase the wearing time. 4.   Subsequently, increase the wearing time by 15-30 minutes only if comfortable to do       so. 5.   You will be seen by your doctor about 2-4 weeks after you receive your orthotic       devices, at which time you will probably be wearing your devices comfortably        for about 8 hours or more a day. 6.   Some patients occasionally report mild aches or discomfort in other parts of the of       body such as the knees, hips or back after 3 or 4 consecutive hours of wear.  If this       is the case with you, do not extend your wearing time.  Instead, cut it back an hour or       two.  In all likelihood, these symptoms will disappear in a short period of time as your       body posture realigns itself and functions more efficiently. 7.   It is possible that your orthotic device may require some small changes or adjustment       to improve their function or make them more comfortable.   This is usually not done       before one to  three months have elapsed.  These adjustments are made in        accordance with the changed position your feet are assuming as a result of       improved biomechanical function. 8.   In women's shoes, it's not unusual for your heel to slip out of the shoe, particularly if       they are step-in-shoes.  If this is the case, try other shoes or other styles.  Try to       purchase shoes which have deeper heal seats or higher heel counters. 9.   Squeaking of orthotics devices in the shoes is due to the movement of the devices       when they are functioning normally.  To eliminate squeaking, simply dust some       baby powder into your shoes before  inserting the devices.  If this does not work,        apply soap or wax to the edges of the orthotic devices or put a tissue into the shoes. 10. It is important that you follow these directions explicitly.  Failure to do so will simply       prolong the adjustment period or create problems which are easily avoided.  It makes       no difference if you are wearing your orthotic devices for only a few hours after        several months, so long as you are wearing them comfortably for those hours. 11. If you have any questions or complaints, contact our office.  We have no way of       knowing about your problems unless you tell us.  If we do not hear from you, we will       assume that you are proceeding well.

## 2019-04-03 ENCOUNTER — Other Ambulatory Visit: Payer: Self-pay

## 2019-04-03 ENCOUNTER — Ambulatory Visit (INDEPENDENT_AMBULATORY_CARE_PROVIDER_SITE_OTHER): Payer: BC Managed Care – PPO

## 2019-04-03 ENCOUNTER — Encounter: Payer: Self-pay | Admitting: Podiatry

## 2019-04-03 ENCOUNTER — Ambulatory Visit: Payer: BC Managed Care – PPO | Admitting: Podiatry

## 2019-04-03 VITALS — Temp 97.0°F

## 2019-04-03 DIAGNOSIS — E11621 Type 2 diabetes mellitus with foot ulcer: Secondary | ICD-10-CM

## 2019-04-03 DIAGNOSIS — L97421 Non-pressure chronic ulcer of left heel and midfoot limited to breakdown of skin: Secondary | ICD-10-CM | POA: Diagnosis not present

## 2019-04-03 DIAGNOSIS — B353 Tinea pedis: Secondary | ICD-10-CM

## 2019-04-03 MED ORDER — CEPHALEXIN 500 MG PO CAPS: 500.0000 mg | ORAL_CAPSULE | Freq: Two times a day (BID) | ORAL | 0 refills | Status: DC

## 2019-04-03 NOTE — Progress Notes (Signed)
Subjective:  Patient ID: [name], male    DOB: [birth year],  MRN: [id number]  Chief Complaint  Patient presents with  . Foot Ulcer    Pt states he noticed yesterday that the ulcer on his left heel is getting wider has been having a lot more drainage than normal deniesN/F/V/C    63 y.o. male presents for wound care. History as above. Using zeesorb states it helps the scaling on the feet, not as red as previous.  Wound with increased drainage, thinks it's getting worse.  Review of Systems: Negative except as noted in the HPI. Denies N/V/F/Ch.  Past Medical History:  Diagnosis Date  . Diabetic foot ulcers (HCC)   . DM2 (diabetes mellitus, type 2) (HCC)   . HTN (hypertension)     Current Outpatient Medications:  .  amoxicillin-clavulanate (AUGMENTIN) 875-125 MG tablet, Take 1 tablet (875 mg) by mouth 2 times per day for 10 days, Disp: , Rfl:  .  aspirin EC 81 MG tablet, Take 81 mg by mouth daily. , Disp: , Rfl:  .  atorvastatin (LIPITOR) 10 MG tablet, Take by mouth., Disp: , Rfl:  .  azithromycin (ZITHROMAX) 250 MG tablet, TAKE 2 TABLETS BY MOUTH ON DAY 1 THEN TAKE 1 TABLET DAILY ON DAYS 2 - 5, Disp: , Rfl:  .  B-D UF III MINI PEN NEEDLES 31G X 5 MM MISC, USE AS DIRECTED UP TO 3 TIMES DAILY, Disp: , Rfl:  .  cephALEXin (KEFLEX) 500 MG capsule, Take 1 capsule (500 mg total) by mouth 2 (two) times daily., Disp: 14 capsule, Rfl: 0 .  cetirizine (ZYRTEC) 10 MG tablet, TAKE 1 TABLET BY MOUTH EVERY DAY FOR 2 WEEKS, Disp: , Rfl:  .  clindamycin (CLEOCIN) 150 MG capsule, Take 1 capsule (150 mg total) by mouth 2 (two) times daily., Disp: 14 capsule, Rfl: 0 .  clotrimazole-betamethasone (LOTRISONE) cream, Apply fingertip amount to bottom of both feet daily., Disp: 30 g, Rfl: 0 .  Continuous Blood Gluc Sensor (FREESTYLE LIBRE 14 DAY SENSOR) MISC, Use to check blood glucose, Disp: , Rfl:  .  EPINEPHrine (EPIPEN 2-PAK) 0.3 mg/0.3 mL IJ SOAJ injection, EpiPen 2-Pak 0.3 mg/0.3 mL injection,  auto-injector, Disp: , Rfl:  .  fluconazole (DIFLUCAN) 150 MG tablet, Take 1 tablet (150 mg total) by mouth once a week., Disp: 3 tablet, Rfl: 0 .  furosemide (LASIX) 40 MG tablet, Take 20 mg by mouth daily. , Disp: , Rfl:  .  insulin aspart protamine - aspart (NOVOLOG MIX 70/30 FLEXPEN) (70-30) 100 UNIT/ML FlexPen, Inject 11-20 Units into the skin See admin instructions. SLIDING SCALE, Disp: , Rfl:  .  Insulin Degludec (TRESIBA [place]), Inject 80 Units into the skin., Disp: , Rfl:  .  losartan (COZAAR) 50 MG tablet, Take by mouth., Disp: , Rfl:  .  NOVOLOG FLEXPEN 100 UNIT/ML FlexPen, INJECT 15 UNITS SUBCUTANEOUSLY 3 TIMES DAILY, Disp: , Rfl:  .  OZEMPIC 0.25 or 0.5 MG/DOSE SOPN, Inject 0.5 mg into the skin once a week., Disp: , Rfl: 5 .  potassium chloride (K-DUR) 10 MEQ tablet, Take 10 mEq by mouth daily., Disp: , Rfl: 0 .  predniSONE (DELTASONE) 20 MG tablet, , Disp: , Rfl:  .  silver sulfADIAZINE (SILVADENE) 1 % cream, Apply pea-sized amount to wound daily., Disp: 50 g, Rfl: 0 .  triamcinolone cream (KENALOG) 0.1 %, apply TO THE AFFECTED AREA TWICE DAILY for 1 week, Disp: , Rfl:   Social History   Tobacco Use  Smoking Status Never Smoker  Smokeless Tobacco Never Used    Allergies  Allergen Reactions  . Other    Objective:   Vitals:   04/03/19 1024  Temp: (!) 97 F (36.1 C)   There is no height or weight on file to calculate BMI. Constitutional Well developed. Well nourished.  Vascular Dorsalis pedis pulses palpable bilaterally. Posterior tibial pulses palpable bilaterally. Capillary refill normal to all digits.  No cyanosis or clubbing noted. Pedal hair growth normal.  Neurologic Normal speech. Oriented to person, place, and time. Protective sensation absent  Dermatologic Wound Location: L heel Wound Base: Granular/Healthy Peri-wound: Macerated, Calloused Exudate: Moderate amount Serosanguinous exudate Wound Measurements: -2x2 post debridement. Slight periwound  redness  Continued scaling without excoriation left plantar foot.  Orthopedic: No pain to palpation either foot.   Radiographs: None today Assessment:   1. 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 Tinea pedis of both feet    Plan:  Patient was evaluated and treated and all questions answered.  Ulcer L Heel -Debridement as below. -Dressed with prisma and DSD. -Offload with CAM boot -If the wound continues to worsen will have to consider taking him out of work as I think this is continuing to cause worsening of his wound. -Rx for keflex due to increased drainage of the wound.  Procedure: Excisional Debridement of Wound Rationale: Removal of non-viable soft tissue from the wound to promote healing.  Anesthesia: none Pre-Debridement Wound Measurements: 1 cm x 1 cm x 0.5 cm  Post-Debridement Wound Measurements: 2 cm x 2 cm x 0.5 cm  Type of Debridement: Sharp Excisional Tissue Removed: Non-viable soft tissue Depth of Debridement: subcutaneous tissue. Technique: Sharp excisional debridement to bleeding, viable wound base.  Dressing: Dry, sterile, compression dressing. Disposition: Patient tolerated procedure well. Patient to return in 1 week for follow-up.     Ulcer R 1st MPJ -Remains healed.  Tinea Pedis -Improving. Continue Zeesorb powder.  Return in about 2 weeks (around 04/17/2019).

## 2019-04-17 ENCOUNTER — Ambulatory Visit: Payer: BC Managed Care – PPO | Admitting: Podiatry

## 2019-04-24 ENCOUNTER — Ambulatory Visit: Payer: BC Managed Care – PPO | Admitting: Podiatry

## 2019-04-24 ENCOUNTER — Encounter: Payer: Self-pay | Admitting: Podiatry

## 2019-04-24 ENCOUNTER — Other Ambulatory Visit: Payer: Self-pay

## 2019-04-24 VITALS — Temp 98.7°F | Resp 16

## 2019-04-24 DIAGNOSIS — L97421 Non-pressure chronic ulcer of left heel and midfoot limited to breakdown of skin: Secondary | ICD-10-CM

## 2019-04-24 DIAGNOSIS — E11621 Type 2 diabetes mellitus with foot ulcer: Secondary | ICD-10-CM

## 2019-05-03 ENCOUNTER — Ambulatory Visit: Payer: BC Managed Care – PPO | Admitting: Orthopedic Surgery

## 2019-05-03 ENCOUNTER — Ambulatory Visit: Payer: Self-pay

## 2019-05-03 ENCOUNTER — Encounter: Payer: Self-pay | Admitting: Orthopedic Surgery

## 2019-05-03 ENCOUNTER — Other Ambulatory Visit: Payer: Self-pay

## 2019-05-03 VITALS — Ht 72.0 in | Wt 256.0 lb

## 2019-05-03 DIAGNOSIS — L89624 Pressure ulcer of left heel, stage 4: Secondary | ICD-10-CM | POA: Diagnosis not present

## 2019-05-03 DIAGNOSIS — M79672 Pain in left foot: Secondary | ICD-10-CM

## 2019-05-03 DIAGNOSIS — G8929 Other chronic pain: Secondary | ICD-10-CM

## 2019-05-03 DIAGNOSIS — L97421 Non-pressure chronic ulcer of left heel and midfoot limited to breakdown of skin: Secondary | ICD-10-CM

## 2019-05-03 MED ORDER — DOXYCYCLINE HYCLATE 100 MG PO TABS: 100.0000 mg | ORAL_TABLET | Freq: Two times a day (BID) | ORAL | 0 refills | Status: DC

## 2019-05-03 NOTE — Progress Notes (Signed)
Office Visit Note   Patient: [name]           Date of Birth: 07/15/1956           MRN: [id number] Visit Date: 05/03/2019              Requested by: [name], [name] G, NP [address] [place], [place] [address] PCP: [name], [name] G, NP  Chief Complaint  Patient presents with  . Left Foot - Wound Check      HPI: Patient is a 63 year old gentleman who is seen for initial evaluation for chronic left heel ulcer.  Patient has previously had uncontrolled type 2 diabetes he states that he is aggressively controlling his sugars at this time.  Patient states he is undergone good conservative wound care of the left heel ulcer for the past 4 months with Dr. Samuella CotaPrice in podiatry.  He states he is recently completed a course of Keflex he then took some Augmentin and he feels like it looks better after the Augmentin.  Patient denies a history of tobacco use.  He states his last hemoglobin A1c was 9.3.  Assessment & Plan: Visit Diagnoses:  1. Chronic heel pain, left     Plan: Ulcer was debrided of skin and soft tissue this does not extend down to bone.  We will have him continue with wound care a felt pad was placed to unload pressure from the heel.  Patient will go to the medical supply store to get a kneeling scooter he will be nonweightbearing on the kneeling scooter.  Prescription called in for doxycycline.  Orders are written for an MRI scan to rule out osteomyelitis of the calcaneus.  Follow-Up Instructions: No follow-ups on file.   Ortho Exam  Patient is alert, oriented, no adenopathy, well-dressed, normal affect, normal respiratory effort. Examination patient has a palpable dorsalis pedis and posterior tibial pulse.  Doppler was used and he has a strong biphasic dorsalis pedis and posterior tibial pulse.  He does have swelling but no cellulitis.  He has a large heel ulcer with rolled edges with the epiboly.  After informed consent a 10 blade knife was used to debride the skin and soft tissue  back to healthy viable bleeding tissue.  Silver nitrate was used for hemostasis after debridement the ulcer is 4 cm in diameter 1 cm deep.  This does not probe to bone.  Radiographs do not show any destructive bony changes the silver nitrate extends down to the soft tissue but not near the bone.  Imaging: No results found. No images are attached to the encounter.  Labs: No results found for: HGBA1C, ESRSEDRATE, CRP, LABURIC, REPTSTATUS, GRAMSTAIN, CULT, LABORGA   No results found for: ALBUMIN, PREALBUMIN, LABURIC  Body mass index is 34.72 kg/m.  Orders:  Orders Placed This Encounter  Procedures  . XR Os Calcis Left   No orders of the defined types were placed in this encounter.    Procedures: No procedures performed  Clinical Data: No additional findings.  ROS:  All other systems negative, except as noted in the HPI. Review of Systems  Objective: Vital Signs: Ht 6' (1.829 m)   Wt 256 lb (116.1 kg)   BMI 34.72 kg/m   Specialty Comments:  No specialty comments available.  PMFS History: Patient Active Problem List   Diagnosis Date Noted  . Cubital tunnel syndrome 08/29/2018  . AKI (acute kidney injury) (HCC) 06/09/2018  . DM2 (diabetes mellitus, type 2) (HCC) 06/09/2018  . HTN (  hypertension) 06/09/2018  . Dehydration 06/09/2018  . Diabetic peripheral neuropathy (HCC) 09/08/2015  . Ulcer of right foot with fat layer exposed (HCC) 09/08/2015  . Primary osteoarthritis of left knee 10/23/2014  . Tear of medial meniscus of left knee, initial encounter 10/23/2014   Past Medical History:  Diagnosis Date  . Diabetic foot ulcers (HCC)   . DM2 (diabetes mellitus, type 2) (HCC)   . HTN (hypertension)     No family history on file.  No past surgical history on file. Social History   Occupational History  . Not on file  Tobacco Use  . Smoking status: Never Smoker  . Smokeless tobacco: Never Used  Substance and Sexual Activity  . Alcohol use: Yes    Comment:  rarely  . Drug use: Never  . Sexual activity: Not on file

## 2019-05-08 ENCOUNTER — Ambulatory Visit: Payer: BC Managed Care – PPO | Admitting: Podiatry

## 2019-05-08 NOTE — Progress Notes (Signed)
Subjective:  Patient ID: [name], male    DOB: 1956-08-03,  MRN: [id number]  Chief Complaint  Patient presents with  . Wound Check    F/U Lt bottom heel ulcer Pt. states," I think the hole is smaller, but it keeps draining more; no pain." tx: coban wrapping and Triple abx cream -w/ more bloody drainage -pt denie sredness/swelling -pt states cam boot came apart and he started using his diabetic insert but got stucked to his shoe and is not able to use it anymore."   . Diabetes    FSB; 93 A1C: unknown   63 y.o. male presents for wound care. History as above.   Review of Systems: Negative except as noted in the HPI. Denies N/V/F/Ch.  Past Medical History:  Diagnosis Date  . Diabetic foot ulcers (HCC)   . DM2 (diabetes mellitus, type 2) (HCC)   . HTN (hypertension)     Current Outpatient Medications:  .  aspirin EC 81 MG tablet, Take 81 mg by mouth daily. , Disp: , Rfl:  .  atorvastatin (LIPITOR) 10 MG tablet, Take by mouth., Disp: , Rfl:  .  azithromycin (ZITHROMAX) 250 MG tablet, TAKE 2 TABLETS BY MOUTH ON DAY 1 THEN TAKE 1 TABLET DAILY ON DAYS 2 - 5, Disp: , Rfl:  .  B-D UF III MINI PEN NEEDLES 31G X 5 MM MISC, USE AS DIRECTED UP TO 3 TIMES DAILY, Disp: , Rfl:  .  cetirizine (ZYRTEC) 10 MG tablet, TAKE 1 TABLET BY MOUTH EVERY DAY FOR 2 WEEKS, Disp: , Rfl:  .  clotrimazole-betamethasone (LOTRISONE) cream, Apply fingertip amount to bottom of both feet daily., Disp: 30 g, Rfl: 0 .  Continuous Blood Gluc Sensor (FREESTYLE LIBRE 14 DAY SENSOR) MISC, Use to check blood glucose, Disp: , Rfl:  .  doxycycline (VIBRA-TABS) 100 MG tablet, Take 1 tablet (100 mg total) by mouth 2 (two) times daily., Disp: 60 tablet, Rfl: 0 .  EPINEPHrine (EPIPEN 2-PAK) 0.3 mg/0.3 mL IJ SOAJ injection, EpiPen 2-Pak 0.3 mg/0.3 mL injection, auto-injector, Disp: , Rfl:  .  fluconazole (DIFLUCAN) 150 MG tablet, Take 1 tablet (150 mg total) by mouth once a week., Disp: 3 tablet, Rfl: 0 .  furosemide (LASIX) 40  MG tablet, Take 20 mg by mouth daily. , Disp: , Rfl:  .  insulin aspart protamine - aspart (NOVOLOG MIX 70/30 FLEXPEN) (70-30) 100 UNIT/ML FlexPen, Inject 11-20 Units into the skin See admin instructions. SLIDING SCALE, Disp: , Rfl:  .  Insulin Degludec (TRESIBA [place]), Inject 80 Units into the skin., Disp: , Rfl:  .  losartan (COZAAR) 50 MG tablet, Take by mouth., Disp: , Rfl:  .  NOVOLOG FLEXPEN 100 UNIT/ML FlexPen, INJECT 15 UNITS SUBCUTANEOUSLY 3 TIMES DAILY, Disp: , Rfl:  .  OZEMPIC 0.25 or 0.5 MG/DOSE SOPN, Inject 0.5 mg into the skin once a week., Disp: , Rfl: 5 .  potassium chloride (K-DUR) 10 MEQ tablet, Take 10 mEq by mouth daily., Disp: , Rfl: 0 .  predniSONE (DELTASONE) 20 MG tablet, , Disp: , Rfl:  .  silver sulfADIAZINE (SILVADENE) 1 % cream, Apply pea-sized amount to wound daily., Disp: 50 g, Rfl: 0 .  triamcinolone cream (KENALOG) 0.1 %, apply TO THE AFFECTED AREA TWICE DAILY for 1 week, Disp: , Rfl:   Social History   Tobacco Use  Smoking Status Never Smoker  Smokeless Tobacco Never Used    Allergies  Allergen Reactions  . Other    Objective:  Vitals:   04/24/19 1117  Resp: 16  Temp: 98.7 F (37.1 C)   There is no height or weight on file to calculate BMI. Constitutional Well developed. Well nourished.  Vascular Dorsalis pedis pulses palpable bilaterally. Posterior tibial pulses palpable bilaterally. Capillary refill normal to all digits.  No cyanosis or clubbing noted. Pedal hair growth normal.  Neurologic Normal speech. Oriented to person, place, and time. Protective sensation absent  Dermatologic Wound Location: L heel Wound Base: Granular/Healthy Peri-wound: Macerated, Calloused Exudate: Moderate amount Serosanguinous exudate Wound Measurements: -2.5x2.5 post-debridement Slight periwound redness  Continued scaling without excoriation left plantar foot.  Orthopedic: No pain to palpation either foot.   Radiographs: None today Assessment:   1.  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  Ulcer L Heel -Debridement as below. -Dressed with prisma and DSD. -Will fashion new DM insert. -If the wound continues to worsen will have to consider taking him out of work as I think this is continuing to cause worsening of his wound.  Procedure: Excisional Debridement of Wound Rationale: Removal of non-viable soft tissue from the wound to promote healing.  Anesthesia: none Pre-Debridement Wound Measurements: 2 cm x 2 cm x 0.3 cm  Post-Debridement Wound Measurements: 2.5 cm x 2.5 cm x 0.3 cm  Type of Debridement: Sharp Excisional Tissue Removed: Non-viable soft tissue Depth of Debridement: subcutaneous tissue. Technique: Sharp excisional debridement to bleeding, viable wound base.  Dressing: Dry, sterile, compression dressing. Disposition: Patient tolerated procedure well. Patient to return in 1 week for follow-up.     Return in about 2 weeks (around 05/08/2019).

## 2019-05-10 ENCOUNTER — Encounter: Payer: Self-pay | Admitting: Orthopedic Surgery

## 2019-05-10 ENCOUNTER — Other Ambulatory Visit: Payer: Self-pay

## 2019-05-10 ENCOUNTER — Ambulatory Visit (INDEPENDENT_AMBULATORY_CARE_PROVIDER_SITE_OTHER): Payer: BC Managed Care – PPO | Admitting: Orthopedic Surgery

## 2019-05-10 VITALS — Ht 72.0 in | Wt 256.0 lb

## 2019-05-10 DIAGNOSIS — L97421 Non-pressure chronic ulcer of left heel and midfoot limited to breakdown of skin: Secondary | ICD-10-CM | POA: Diagnosis not present

## 2019-05-10 NOTE — Progress Notes (Signed)
Office Visit Note   Patient: [name]           Date of Birth: [birth year]           MRN: [id number] Visit Date: 05/10/2019              Requested by: Hal Morales, NP [address] [place], [place] [address] PCP: Hal Morales, NP  Chief Complaint  Patient presents with  . Left Foot - Follow-up      HPI: Patient is a 63 year old gentleman who presents in follow-up for left heel ulcer.  Patient has started doxycycline he says he is taken 2 to 3 pills a day he states the foot is doing much better he states the drainage has stopped.  He states he did get a kneeling scooter but did not bring it with him.  The MRI scan has not been performed yet.  Assessment & Plan: Visit Diagnoses:  1. 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  Follow-Up Instructions: Return in about 2 weeks (around 05/24/2019).   Ortho Exam  Patient is alert, oriented, no adenopathy, well-dressed, normal affect, normal respiratory effort. Examination patient has a good pulse there is some venous stasis swelling.  No venous ulcers the heel has a large ulcer with rolled edges.  After informed consent a 10 blade knife was used to debride the skin and soft tissue back to healthy viable granulation tissue this was touched with silver nitrate the ulcer was 3 x 2 cm and 5 mm deep this did not probe to bone or tendon healthy granulation tissue at the base.  Iodosorb and a dry dressing were applied  Imaging: No results found. No images are attached to the encounter.  Labs: No results found for: HGBA1C, ESRSEDRATE, CRP, LABURIC, REPTSTATUS, GRAMSTAIN, CULT, LABORGA   No results found for: ALBUMIN, PREALBUMIN, LABURIC  Body mass index is 34.72 kg/m.  Orders:  No orders of the defined types were placed in this encounter.  No  orders of the defined types were placed in this encounter.    Procedures: No procedures performed  Clinical Data: No additional findings.  ROS:  All other systems negative, except as noted in the HPI. Review of Systems  Objective: Vital Signs: Ht 6' (1.829 m)   Wt 256 lb (116.1 kg)   BMI 34.72 kg/m   Specialty Comments:  No specialty comments available.  PMFS History: Patient Active Problem List   Diagnosis Date Noted  . Cubital tunnel syndrome 08/29/2018  . AKI (acute kidney injury) (HCC) 06/09/2018  . DM2 (diabetes mellitus, type 2) (HCC) 06/09/2018  . HTN (hypertension) 06/09/2018  . Dehydration 06/09/2018  . Diabetic peripheral neuropathy (HCC) 09/08/2015  . Ulcer of right foot with fat layer exposed (HCC) 09/08/2015  . Primary osteoarthritis of left knee 10/23/2014  . Tear of medial meniscus of left knee, initial encounter 10/23/2014   Past Medical History:  Diagnosis Date  . Diabetic foot ulcers (HCC)   . DM2 (diabetes mellitus, type 2) (HCC)   . HTN (hypertension)     History reviewed. No pertinent family history.  History reviewed. No pertinent surgical history. Social History   Occupational History  . Not on file  Tobacco Use  . Smoking status: Never Smoker  . Smokeless tobacco: Never Used  Substance and Sexual Activity  .  Alcohol use: Yes    Comment: rarely  . Drug use: Never  . Sexual activity: Not on file

## 2019-05-12 ENCOUNTER — Other Ambulatory Visit: Payer: BC Managed Care – PPO

## 2019-05-12 ENCOUNTER — Telehealth: Payer: Self-pay | Admitting: Internal Medicine

## 2019-05-12 ENCOUNTER — Telehealth: Payer: Self-pay | Admitting: *Deleted

## 2019-05-12 DIAGNOSIS — Z20822 Contact with and (suspected) exposure to covid-19: Secondary | ICD-10-CM

## 2019-05-12 NOTE — Telephone Encounter (Signed)
Patient prefers to be called with Covid-19 test results.  Patient's wife is a cancer patient receiving chemotherapy.

## 2019-05-12 NOTE — Telephone Encounter (Signed)
I called pt and let him be aware he may have been potentially exposed to an employee who later tested positive for the COVID-19.  I let him know we are offering free COVID-19 testing.   He did want to have the test done.  He expressed concern because his wife has Stage IV colon CA and is receiving chemo.  They have already been living in separate bedrooms and bathrooms.   They are also checking their temperatures twice a day.   I instructed him to continue doing those things.   I also went over the s/s to look for with the COVID-19.  I scheduled him for today at 3:00 at the [facility] location in [place].  I entered the order and sent a note to the agents at the [facility] pool to be put on the schedule.

## 2019-05-14 LAB — NOVEL CORONAVIRUS, NAA: SARS-CoV-2, NAA: NOT DETECTED

## 2019-05-15 ENCOUNTER — Telehealth: Payer: Self-pay | Admitting: Radiology

## 2019-05-15 ENCOUNTER — Telehealth: Payer: Self-pay | Admitting: Orthopedic Surgery

## 2019-05-15 NOTE — Telephone Encounter (Signed)
I called and left vm on his phone and cell to return my call.

## 2019-05-15 NOTE — Telephone Encounter (Signed)
Pt called in wanted to know the results of his covid test he had done, said he cant wait he needs to know the results due to his wife being a cancer patient. [phone]

## 2019-05-15 NOTE — Telephone Encounter (Signed)
Patient called in is very frustrated has not received any phone call about MRI yet. Explained will notify outgoing referrals for authorization to be worked on today and that once approved imaging facility will call patient to schedule. MRI placed on 05/03/2019.  Call back number  [phone]

## 2019-05-15 NOTE — Telephone Encounter (Signed)
Pt called see message below. Are we supposed to be looking for result and giving to pt? What do we do with these phone calls?

## 2019-05-15 NOTE — Telephone Encounter (Signed)
IC patient and he said that he had already been advised and was aware test was negative.

## 2019-05-23 ENCOUNTER — Ambulatory Visit
Admission: RE | Admit: 2019-05-23 | Discharge: 2019-05-23 | Disposition: A | Discharge status: Home / Self Care | Payer: BC Managed Care – PPO | Source: Ambulatory Visit | Attending: Orthopedic Surgery | Admitting: Orthopedic Surgery

## 2019-05-23 ENCOUNTER — Other Ambulatory Visit: Payer: Self-pay

## 2019-05-23 DIAGNOSIS — L89624 Pressure ulcer of left heel, stage 4: Secondary | ICD-10-CM

## 2019-05-24 ENCOUNTER — Encounter: Payer: Self-pay | Admitting: Orthopedic Surgery

## 2019-05-24 ENCOUNTER — Ambulatory Visit (INDEPENDENT_AMBULATORY_CARE_PROVIDER_SITE_OTHER): Payer: BC Managed Care – PPO | Admitting: Orthopedic Surgery

## 2019-05-24 VITALS — Ht 72.0 in | Wt 256.0 lb

## 2019-05-24 DIAGNOSIS — L97421 Non-pressure chronic ulcer of left heel and midfoot limited to breakdown of skin: Secondary | ICD-10-CM | POA: Diagnosis not present

## 2019-05-24 NOTE — Progress Notes (Signed)
Office Visit Note   Patient: [name]           Date of Birth: [birth year]           MRN: [id number] Visit Date: 05/24/2019              Requested by: Charlynn Court, NP [address] [place],   [address] PCP: Charlynn Court, NP  Chief Complaint  Patient presents with  . Left Foot - Follow-up    MRI review ulcer left heel       HPI: Patient is a 63 year old gentleman with a left heel ulcer.  Patient is currently using a kneeling scooter for most walking he has started using a felt relieving donut and dry dressing changes.  Assessment & Plan: Visit Diagnoses:  1. Non-pressure chronic ulcer of left heel and midfoot limited to breakdown of skin ([place])     Plan: Patient is showing good improvement in the deep granulation tissue.  We will give him 2 of the felt relieving donuts he will change this daily with dry gauze dressing continue with protected weightbearing.  Follow-Up Instructions: Return in about 3 weeks (around 06/14/2019).   Ortho Exam  Patient is alert, oriented, no adenopathy, well-dressed, normal affect, normal respiratory effort. Examination patient has a good dorsalis pedis and posterior tibial pulse no arterial insufficiency he does have venous insufficiency with brawny skin color changes and swelling.  The left heel ulcer is 2 cm in diameter 0.5 mm deep with good healthy granulation tissue at the base.  This appears to be filling in well.  Review of the MRI scan shows no osteomyelitis of the calcaneus no deep abscess.  Imaging: No results found. No images are attached to the encounter.  Labs: No results found for: HGBA1C, ESRSEDRATE, CRP, LABURIC, REPTSTATUS, GRAMSTAIN, CULT, LABORGA   No results found for: ALBUMIN, PREALBUMIN, LABURIC  Body mass index is 34.72 kg/m.  Orders:  No orders of the defined types were placed in this encounter.  No orders of the defined types were placed in this encounter.    Procedures: No procedures performed   Clinical Data: No additional findings.  ROS:  All other systems negative, except as noted in the HPI. Review of Systems  Objective: Vital Signs: Ht 6' (1.829 m)   Wt 256 lb (116.1 kg)   BMI 34.72 kg/m   Specialty Comments:  No specialty comments available.  PMFS History: Patient Active Problem List   Diagnosis Date Noted  . Cubital tunnel syndrome 08/29/2018  . AKI (acute kidney injury) ([place]) 06/09/2018  . DM2 (diabetes mellitus, type 2) ([place]) 06/09/2018  . HTN (hypertension) 06/09/2018  . Dehydration 06/09/2018  . Diabetic peripheral neuropathy ([place]) 09/08/2015  . Ulcer of right foot with fat layer exposed ([place]) 09/08/2015  . Primary osteoarthritis of left knee 10/23/2014  . Tear of medial meniscus of left knee, initial encounter 10/23/2014   Past Medical History:  Diagnosis Date  . Diabetic foot ulcers ([place])   . DM2 (diabetes mellitus, type 2) ([place])   . HTN (hypertension)     History reviewed. No pertinent family history.  History reviewed. No pertinent surgical history. Social History   Occupational History  . Not on file  Tobacco Use  . Smoking status: Never Smoker  . Smokeless tobacco: Never Used  Substance and Sexual Activity  . Alcohol use: Yes    Comment: rarely  . Drug use: Never  . Sexual activity: Not on file

## 2019-05-25 ENCOUNTER — Telehealth: Payer: Self-pay | Admitting: Orthopedic Surgery

## 2019-05-25 NOTE — Telephone Encounter (Signed)
Patient called and requested a handicap sticker.  Please call patient to advise.  [phone]

## 2019-05-28 NOTE — Telephone Encounter (Signed)
Yes, okay for handicapped placard. Thanks

## 2019-05-28 NOTE — Telephone Encounter (Signed)
I called patient and advised placard paperwork at front for pick up. Did advise we are closed for lunch from 1130-1230.  Patient also states that he is using [facility] now and had given all of Dr. Jess Barters information to them. They may be contacting us for his medical supply needs.

## 2019-05-28 NOTE — Telephone Encounter (Signed)
Please advise. OK for handicap placard?

## 2019-05-29 ENCOUNTER — Telehealth: Payer: Self-pay | Admitting: Orthopedic Surgery

## 2019-05-29 NOTE — Telephone Encounter (Signed)
Christine with [facility] called in said she is going to be faxing something over to [place] just stating that she needs clarification on specific supple's needed for wound care for this pt. She said you can fax it back or give her a verbal response.  [phone]

## 2019-05-29 NOTE — Telephone Encounter (Signed)
Christine with [employer] called needing to know all the supplies patient will need. She said she will fax over order for review. The number to contact Altha Harm is [phone]

## 2019-05-30 NOTE — Telephone Encounter (Signed)
Duplicate message will sign off on this and address the message I do have.

## 2019-05-30 NOTE — Telephone Encounter (Signed)
I called and lm on vm to advise that the pt was in the office 05/24/19 he ws advised to apply a dry dressing to his ulcer and change this daily. 4x4 gauze and paper tape. To call with any questions. Lm on vm

## 2019-05-31 NOTE — Telephone Encounter (Signed)
Will hold and await fax.

## 2019-05-31 NOTE — Telephone Encounter (Signed)
Christine with [facility] called in said Is going to fill out a order form for supplies that the pt is requesting and going to be faxing that over to [place] and she would just like for autumn to add any additional supplies that may be needed and all wound information which there will be a section that needs to be filled out for that. She said any questions give her a call [phone] or  [phone] Ext: [phone] if u cant get ahold of christine.

## 2019-06-01 NOTE — Telephone Encounter (Signed)
Left heel ulcer. [facility] form received and completed and pending signature for Dr. Sharol Given will hold this message for Monday and then fax after signature obtained.

## 2019-06-04 NOTE — Telephone Encounter (Signed)
Signed and faxed today.

## 2019-06-11 ENCOUNTER — Telehealth: Payer: Self-pay | Admitting: Orthopedic Surgery

## 2019-06-11 NOTE — Telephone Encounter (Signed)
patient called in wanting a nurse to call him back ASAP non emergency wouldn't discuss what was going on but would talk to a nurse.  Please advice

## 2019-06-11 NOTE — Telephone Encounter (Signed)
I called pt and he states that he needs some more of the felt donuts for his shoe. Advised that I can leave a few at the front desk and he can come and pick them up at any time. Pt has an appt on 06/14/19 and will discuss alternative to the donut something that he can purchase and have access to in the future.

## 2019-06-14 ENCOUNTER — Ambulatory Visit (INDEPENDENT_AMBULATORY_CARE_PROVIDER_SITE_OTHER): Payer: BC Managed Care – PPO | Admitting: Physician Assistant

## 2019-06-14 ENCOUNTER — Other Ambulatory Visit: Payer: Self-pay

## 2019-06-14 ENCOUNTER — Encounter: Payer: Self-pay | Admitting: Orthopedic Surgery

## 2019-06-14 VITALS — Ht 72.0 in | Wt 256.0 lb

## 2019-06-14 DIAGNOSIS — L89623 Pressure ulcer of left heel, stage 3: Secondary | ICD-10-CM | POA: Diagnosis not present

## 2019-06-14 NOTE — Progress Notes (Signed)
Office Visit Note   Patient: [name]           Date of Birth: 1956-05-08           MRN: [id number] Visit Date: 06/14/2019              Requested by: Charlynn Court, NP [address] [place],  [place] [address] PCP: Charlynn Court, NP  Chief Complaint  Patient presents with  . Left Foot - Follow-up      HPI: The patient is a 63 year old gentleman who presents for follow-up of his left heel ulcer.  He reports that he has been mostly utilizing a doughnut to try to take pressure off the area and he brought in several Dr. Felicie Morn gel cushions today to see if we could cut holes in this to try to relieve pressure.  We discussed that the most important thing was to minimize weightbearing over the heel is much as possible and to utilize his kneeling scooter which he does not have with him today.  Assessment & Plan: Visit Diagnoses:  1. Pressure injury of left heel, stage 4 (HCC)     Plan: Reinforced the importance of nonweightbearing through the left heel is much as possible and recommended a 2 XL Vive medical compression sock for the patient to wear around the clock except for showering.  He should continue to utilize his knee scooter for the majority of his mobility and avoid weightbearing as much as possible through the left heel.  He will follow-up in 3 weeks.  Follow-Up Instructions: Return in about 3 weeks (around 07/05/2019).   Ortho Exam  Patient is alert, oriented, no adenopathy, well-dressed, normal affect, normal respiratory effort. Patient has brawny edema of the left lower extremity.  He has good dorsalis and posterior tibialis pulses.  The calf circumference on the left is 46 cm and were going to recommend a medical compression sock.  The diameter of the ulcer is 3 cm and he has pink tissue within the wound bed with moderate serosanguineous drainage without odor.  There are no signs of cellulitis of the foot.  Imaging: No results found. No images are attached to the  encounter.  Labs: No results found for: HGBA1C, ESRSEDRATE, CRP, LABURIC, REPTSTATUS, GRAMSTAIN, CULT, LABORGA   No results found for: ALBUMIN, PREALBUMIN, LABURIC  No results found for: MG No results found for: VD25OH  No results found for: PREALBUMIN CBC EXTENDED Latest Ref Rng & Units 06/10/2018 06/09/2018  WBC 4.0 - 10.5 K/uL 5.3 7.2  RBC 4.22 - 5.81 MIL/uL 4.27 4.57  HGB 13.0 - 17.0 g/dL 11.9(L) 12.9(L)  HCT 39.0 - 52.0 % 34.2(L) 36.6(L)  PLT 150 - 400 K/uL 146(L) 180  NEUTROABS 1.7 - 7.7 K/uL - 5.9  LYMPHSABS 0.7 - 4.0 K/uL - 0.8     Body mass index is 34.72 kg/m.  Orders:  No orders of the defined types were placed in this encounter.  No orders of the defined types were placed in this encounter.    Procedures: No procedures performed  Clinical Data: No additional findings.  ROS:  All other systems negative, except as noted in the HPI. Review of Systems  Objective: Vital Signs: Ht 6' (1.829 m)   Wt 256 lb (116.1 kg)   BMI 34.72 kg/m   Specialty Comments:  No specialty comments available.  PMFS History: Patient Active Problem List   Diagnosis Date Noted  . Cubital tunnel syndrome 08/29/2018  . AKI (acute kidney  injury) (HCC) 06/09/2018  . DM2 (diabetes mellitus, type 2) (HCC) 06/09/2018  . HTN (hypertension) 06/09/2018  . Dehydration 06/09/2018  . Diabetic peripheral neuropathy (HCC) 09/08/2015  . Ulcer of right foot with fat layer exposed (HCC) 09/08/2015  . Primary osteoarthritis of left knee 10/23/2014  . Tear of medial meniscus of left knee, initial encounter 10/23/2014   Past Medical History:  Diagnosis Date  . Diabetic foot ulcers (HCC)   . DM2 (diabetes mellitus, type 2) (HCC)   . HTN (hypertension)     History reviewed. No pertinent family history.  History reviewed. No pertinent surgical history. Social History   Occupational History  . Not on file  Tobacco Use  . Smoking status: Never Smoker  . Smokeless tobacco: Never Used   Substance and Sexual Activity  . Alcohol use: Yes    Comment: rarely  . Drug use: Never  . Sexual activity: Not on file

## 2019-06-18 ENCOUNTER — Ambulatory Visit: Payer: BC Managed Care – PPO | Admitting: Orthopedic Surgery

## 2019-07-05 ENCOUNTER — Ambulatory Visit: Payer: BC Managed Care – PPO | Admitting: Physician Assistant

## 2019-07-06 ENCOUNTER — Ambulatory Visit (INDEPENDENT_AMBULATORY_CARE_PROVIDER_SITE_OTHER): Payer: BC Managed Care – PPO | Admitting: Family

## 2019-07-06 ENCOUNTER — Encounter: Payer: Self-pay | Admitting: Family

## 2019-07-06 VITALS — Ht 72.0 in | Wt 256.0 lb

## 2019-07-06 DIAGNOSIS — L97512 Non-pressure chronic ulcer of other part of right foot with fat layer exposed: Secondary | ICD-10-CM

## 2019-07-06 DIAGNOSIS — E1142 Type 2 diabetes mellitus with diabetic polyneuropathy: Secondary | ICD-10-CM

## 2019-07-06 MED ORDER — DOXYCYCLINE HYCLATE 100 MG PO TABS: 100.0000 mg | ORAL_TABLET | Freq: Two times a day (BID) | ORAL | 0 refills | Status: DC

## 2019-07-06 NOTE — Progress Notes (Signed)
Office Visit Note   Patient: [name]           Date of Birth: 15-Jul-1956           MRN: [id number] Visit Date: 07/06/2019              Requested by: Charlynn Court, NP [address] [place],  [place] [address] PCP: Charlynn Court, NP  Chief Complaint  Patient presents with  . Left Foot - Follow-up      HPI: The patient is a 63 year old gentleman who presents for follow-up ulceration to his left heel today.  He is concerned that the wound may be worsened.  Complaining of a foul odor as well is some drainage.  Describes the drainage is clear and yellow.  Again reports he has been unable to nonweightbearing he does have a kneeling scooter which is difficult to navigate in his home.  He is using felt pressure relieving donuts in his shoe wear.    Has some silver gel that he wonders if he could use in the wound.  States he is wearing his medical compression sock daily over a dry dressing he does pack the wound open.  Noticed increase in drainage and onset of odor after completing his doxycyline cousre  Assessment & Plan: Visit Diagnoses:  1. Ulcer of right foot with fat layer exposed ([place])   2. Diabetic peripheral neuropathy ([place])     Plan: Again as my colleagues have before me reinforce the importance of nonweightbearing of the left foot is much as possible.  He will continue with his compression.  Will refill his doxycycline.  Provided with silver cell for his dressing changes her pack first with silvercel then she then gauze.  Follow-Up Instructions: Return in about 2 weeks (around 07/20/2019).   Ortho Exam  Patient is alert, oriented, no adenopathy, well-dressed, normal affect, normal respiratory effort.  On examination of the left lower extremity there is trace edema.  He does have a decubitus ulcer of the left heel this is 3 cm in diameter central ulcer is 2 cm in diameter and 1 cm deep there is some granulation in the wound bed about 10% necrotic tissue.  There is some odor which  may be from drainage there is no active purulence.  There is maceration surrounding this is superficial there is no erythema or ascending cellulitis.   Imaging: No results found. No images are attached to the encounter.  Labs: No results found for: HGBA1C, ESRSEDRATE, CRP, LABURIC, REPTSTATUS, GRAMSTAIN, CULT, LABORGA   No results found for: ALBUMIN, PREALBUMIN, LABURIC  No results found for: MG No results found for: VD25OH  No results found for: PREALBUMIN CBC EXTENDED Latest Ref Rng & Units 06/10/2018 06/09/2018  WBC 4.0 - 10.5 K/uL 5.3 7.2  RBC 4.22 - 5.81 MIL/uL 4.27 4.57  HGB 13.0 - 17.0 g/dL 11.9(L) 12.9(L)  HCT 39.0 - 52.0 % 34.2(L) 36.6(L)  PLT 150 - 400 K/uL 146(L) 180  NEUTROABS 1.7 - 7.7 K/uL - 5.9  LYMPHSABS 0.7 - 4.0 K/uL - 0.8     Body mass index is 34.72 kg/m.  Orders:  No orders of the defined types were placed in this encounter.  Meds ordered this encounter  Medications  . doxycycline (VIBRA-TABS) 100 MG tablet    Sig: Take 1 tablet (100 mg total) by mouth 2 (two) times daily.    Dispense:  60 tablet    Refill:  0     Procedures: No  procedures performed  Clinical Data: No additional findings.  ROS:  All other systems negative, except as noted in the HPI. Review of Systems  Constitutional: Negative for chills and fever.  Cardiovascular: Positive for leg swelling.  Skin: Positive for wound. Negative for color change.    Objective: Vital Signs: Ht 6' (1.829 m)   Wt 256 lb (116.1 kg)   BMI 34.72 kg/m   Specialty Comments:  No specialty comments available.  PMFS History: Patient Active Problem List   Diagnosis Date Noted  . Cubital tunnel syndrome 08/29/2018  . AKI (acute kidney injury) (HCC) 06/09/2018  . DM2 (diabetes mellitus, type 2) (HCC) 06/09/2018  . HTN (hypertension) 06/09/2018  . Dehydration 06/09/2018  . Diabetic peripheral neuropathy (HCC) 09/08/2015  . Ulcer of right foot with fat layer exposed (HCC) 09/08/2015  .  Primary osteoarthritis of left knee 10/23/2014  . Tear of medial meniscus of left knee, initial encounter 10/23/2014   Past Medical History:  Diagnosis Date  . Diabetic foot ulcers (HCC)   . DM2 (diabetes mellitus, type 2) (HCC)   . HTN (hypertension)     History reviewed. No pertinent family history.  History reviewed. No pertinent surgical history. Social History   Occupational History  . Not on file  Tobacco Use  . Smoking status: Never Smoker  . Smokeless tobacco: Never Used  Substance and Sexual Activity  . Alcohol use: Yes    Comment: rarely  . Drug use: Never  . Sexual activity: Not on file

## 2019-07-20 ENCOUNTER — Encounter: Payer: Self-pay | Admitting: Family

## 2019-07-20 ENCOUNTER — Ambulatory Visit (INDEPENDENT_AMBULATORY_CARE_PROVIDER_SITE_OTHER): Payer: BC Managed Care – PPO | Admitting: Family

## 2019-07-20 VITALS — Ht 72.0 in | Wt 256.0 lb

## 2019-07-20 DIAGNOSIS — E1142 Type 2 diabetes mellitus with diabetic polyneuropathy: Secondary | ICD-10-CM

## 2019-07-20 DIAGNOSIS — L97421 Non-pressure chronic ulcer of left heel and midfoot limited to breakdown of skin: Secondary | ICD-10-CM | POA: Diagnosis not present

## 2019-07-21 ENCOUNTER — Encounter: Payer: Self-pay | Admitting: Family

## 2019-07-21 NOTE — Progress Notes (Signed)
Office Visit Note   Patient: [name]           Date of Birth: 09/08/1956           MRN: [id number] Visit Date: 07/20/2019              Requested by: [name], [name], NP [address] [place],  [place] [id number] PCP: [name], [name], NP  Chief Complaint  Patient presents with  . Left Foot - Follow-up    Heel ulcer      HPI: The patient is a 63 year old gentleman who presents today in follow-up for a left heel ulcer today.  He is beginning to despair the lack of improvement.  He has been doing changes gauze.  To relieve pressure in his shoe wear however he has been unable to minimize his weightbearing.  He has been working part-time doing COVID cleaning and is concerned that he may be increasing his work limiting them.  His lower extremity edema on the left.  Denies any fevers or chills  Has complained of significant GI upset with diarrhea with the doxycycline.  He is now been on it for 14 days.  Does admit that he has not been wearing his compression consistently  Assessment & Plan: Visit Diagnoses:  No diagnosis found.  Plan: He will stop taking the doxycycline at this time.  Watch for any worsening or sign of infection.  Compression as well as nonweightbearing again.  Lengthy discussion about his wound.  He will continue with the silver cell dressings packing the wound open discussed the possibility of compression wraps he would like to use his compression garments at home at this time.     Follow-Up Instructions: Return in about 24 days (around 08/13/2019).   Ortho Exam  Patient is alert, oriented, no adenopathy, well-dressed, normal affect, normal respiratory effort.  On examination of the left lower extremity he now has significant edema however this is not pitting to lower leg and foot.  He has an ulcer to his left heel that is now 3 cm in diameter central ulceration that is 2 cm and is 10 mm deep there is some flat pink tissue in the wound bed this does not probe to bone or  tendon.  There is surrounding maceration of the nonviable tissue was debrided with a 10 blade knife viable tissue today.  There is no active drainage no odor no necrotic tissue today No erythema no warmth to his foot or ankle   Imaging: No results found. No images are attached to the encounter.  Labs: No results found for: HGBA1C, ESRSEDRATE, CRP, LABURIC, REPTSTATUS, GRAMSTAIN, CULT, LABORGA   No results found for: ALBUMIN, PREALBUMIN, LABURIC  No results found for: MG No results found for: VD25OH  No results found for: PREALBUMIN CBC EXTENDED Latest Ref Rng & Units 06/10/2018 06/09/2018  WBC 4.0 - 10.5 K/uL 5.3 7.2  RBC 4.22 - 5.81 MIL/uL 4.27 4.57  HGB 13.0 - 17.0 g/dL 11.9(L) 12.9(L)  HCT 39.0 - 52.0 % 34.2(L) 36.6(L)  PLT 150 - 400 K/uL 146(L) 180  NEUTROABS 1.7 - 7.7 K/uL - 5.9  LYMPHSABS 0.7 - 4.0 K/uL - 0.8     Body mass index is 34.72 kg/m.  Orders:  No orders of the defined types were placed in this encounter.  No orders of the defined types were placed in this encounter.    Procedures: No procedures performed  Clinical Data: No additional findings.  ROS:  All other  systems negative, except as noted in the HPI. Review of Systems  Constitutional: Negative for chills and fever.  Cardiovascular: Positive for leg swelling.  Skin: Positive for wound. Negative for color change.    Objective: Vital Signs: Ht 6' (1.829 m)   Wt 256 lb (116.1 kg)   BMI 34.72 kg/m   Specialty Comments:  No specialty comments available.  PMFS History: Patient Active Problem List   Diagnosis Date Noted  . Cubital tunnel syndrome 08/29/2018  . AKI (acute kidney injury) ([place]) 06/09/2018  . DM2 (diabetes mellitus, type 2) ([place]) 06/09/2018  . HTN (hypertension) 06/09/2018  . Dehydration 06/09/2018  . Diabetic peripheral neuropathy ([place]) 09/08/2015  . Ulcer of right foot with fat layer exposed ([place]) 09/08/2015  . Primary osteoarthritis of left knee 10/23/2014  . Tear of  medial meniscus of left knee, initial encounter 10/23/2014   Past Medical History:  Diagnosis Date  . Diabetic foot ulcers ([place])   . DM2 (diabetes mellitus, type 2) ([place])   . HTN (hypertension)     No family history on file.  No past surgical history on file. Social History   Occupational History  . Not on file  Tobacco Use  . Smoking status: Never Smoker  . Smokeless tobacco: Never Used  Substance and Sexual Activity  . Alcohol use: Yes    Comment: rarely  . Drug use: Never  . Sexual activity: Not on file

## 2019-08-03 ENCOUNTER — Ambulatory Visit (INDEPENDENT_AMBULATORY_CARE_PROVIDER_SITE_OTHER): Payer: BC Managed Care – PPO | Admitting: Family

## 2019-08-03 ENCOUNTER — Telehealth: Payer: Self-pay

## 2019-08-03 ENCOUNTER — Encounter: Payer: Self-pay | Admitting: Family

## 2019-08-03 VITALS — Ht 72.0 in | Wt 256.0 lb

## 2019-08-03 DIAGNOSIS — M79672 Pain in left foot: Secondary | ICD-10-CM

## 2019-08-03 DIAGNOSIS — G8929 Other chronic pain: Secondary | ICD-10-CM

## 2019-08-03 DIAGNOSIS — I872 Venous insufficiency (chronic) (peripheral): Secondary | ICD-10-CM

## 2019-08-03 DIAGNOSIS — L97421 Non-pressure chronic ulcer of left heel and midfoot limited to breakdown of skin: Secondary | ICD-10-CM | POA: Diagnosis not present

## 2019-08-03 DIAGNOSIS — R600 Localized edema: Secondary | ICD-10-CM

## 2019-08-03 NOTE — Progress Notes (Signed)
Office Visit Note   Patient: Vernice JeffersonRonald R Koepp           Date of Birth: December 01, 1956           MRN: [id number] Visit Date: 08/03/2019              Requested by: [name], [name], NP [address] [place],  [place] [id number] PCP: [name], [name], NP  Chief Complaint  Patient presents with  . Left Foot - Follow-up      HPI: The patient is a 63 year old gentleman seen today in follow-up for left heel ulcer.  He has been offloading his heel and nonweightbearing much more over the last week.  He had been doing silver cell dressing changes to the wound.   Complains of increasing posterior heel pain not the Achilles rather the soft tissue surrounding.  He is concerned for swelling which has been gradually worsening.  States he is unable to don his compression garments has several different types at home but cannot get them on.   Has been having considerable drainage states has had to change his dressing twice daily.   Assessment & Plan: Visit Diagnoses:  1. 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 Chronic heel pain, left   3. 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  Hope that this will improve his swelling and wound healing.  He will follow-up on Monday to change this.  Follow-Up Instructions: Return in about 3 days (around 08/06/2019).   Ortho Exam  Patient is alert, oriented, no adenopathy, well-dressed, normal affect, normal respiratory effort.  On examination of the left lower extremity. he now has significant edema however this is not pitting to lower leg and foot.  Does have brawny skin color changes as well as dry flaking skin.  He has an ulcer to his left heel that is now 3 cm in diameter central ulceration that is 2 cm and is 8 mm deep there is some flat pink tissue in the wound bed this does not probe to bone or tendon.  There  is surrounding maceration of the nonviable tissue was debrided with a 10 blade knife viable tissue today.  There is no active drainage no odor no necrotic tissue today.   No erythema no warmth to his foot or ankle.  Achilles nontender.  Intact no defects.   Imaging: No results found. No images are attached to the encounter.  Labs: No results found for: HGBA1C, ESRSEDRATE, CRP, LABURIC, REPTSTATUS, GRAMSTAIN, CULT, LABORGA   No results found for: ALBUMIN, PREALBUMIN, LABURIC  No results found for: MG No results found for: VD25OH  No results found for: PREALBUMIN CBC EXTENDED Latest Ref Rng & Units 06/10/2018 06/09/2018  WBC 4.0 - 10.5 K/uL 5.3 7.2  RBC 4.22 - 5.81 MIL/uL 4.27 4.57  HGB 13.0 - 17.0 g/dL 11.9(L) 12.9(L)  HCT 39.0 - 52.0 % 34.2(L) 36.6(L)  PLT 150 - 400 K/uL 146(L) 180  NEUTROABS 1.7 - 7.7 K/uL - 5.9  LYMPHSABS 0.7 - 4.0 K/uL - 0.8     Body mass index is 34.72 kg/m.  Orders:  No orders of the defined types were placed in this encounter.  No orders of the defined types were placed in this encounter.    Procedures: No procedures performed  Clinical Data: No additional  findings.  ROS:  All other systems negative, except as noted in the HPI. Review of Systems  Constitutional: Negative for chills and fever.  Cardiovascular: Positive for leg swelling.  Skin: Positive for wound. Negative for color change.    Objective: Vital Signs: Ht 6' (1.829 m)   Wt 256 lb (116.1 kg)   BMI 34.72 kg/m   Specialty Comments:  No specialty comments available.  PMFS History: Patient Active Problem List   Diagnosis Date Noted  . Cubital tunnel syndrome 08/29/2018  . AKI (acute kidney injury) ([place]) 06/09/2018  . DM2 (diabetes mellitus, type 2) ([place]) 06/09/2018  . HTN (hypertension) 06/09/2018  . Dehydration 06/09/2018  . Diabetic peripheral neuropathy ([place]) 09/08/2015  . Ulcer of right foot with fat layer exposed ([place]) 09/08/2015  . Primary osteoarthritis of  left knee 10/23/2014  . Tear of medial meniscus of left knee, initial encounter 10/23/2014   Past Medical History:  Diagnosis Date  . Diabetic foot ulcers ([place])   . DM2 (diabetes mellitus, type 2) ([place])   . HTN (hypertension)     History reviewed. No pertinent family history.  History reviewed. No pertinent surgical history. Social History   Occupational History  . Not on file  Tobacco Use  . Smoking status: Never Smoker  . Smokeless tobacco: Never Used  Substance and Sexual Activity  . Alcohol use: Yes    Comment: rarely  . Drug use: Never  . Sexual activity: Not on file

## 2019-08-03 NOTE — Telephone Encounter (Signed)
Patient called concerning an antibiotic being sent to his pharmacy.  Stated that he wasn't sure if one would be sent to his pharmacy, but if so, he would like antibiotic sent to [facility] in [place].  CB#  [phone].  Please advise.  Thank you.

## 2019-08-06 ENCOUNTER — Ambulatory Visit (INDEPENDENT_AMBULATORY_CARE_PROVIDER_SITE_OTHER): Payer: BC Managed Care – PPO | Admitting: Orthopedic Surgery

## 2019-08-06 ENCOUNTER — Encounter: Payer: Self-pay | Admitting: Orthopedic Surgery

## 2019-08-06 ENCOUNTER — Ambulatory Visit: Payer: Self-pay

## 2019-08-06 VITALS — Ht 72.0 in | Wt 256.0 lb

## 2019-08-06 DIAGNOSIS — L97421 Non-pressure chronic ulcer of left heel and midfoot limited to breakdown of skin: Secondary | ICD-10-CM

## 2019-08-06 DIAGNOSIS — M86172 Other acute osteomyelitis, left ankle and foot: Secondary | ICD-10-CM

## 2019-08-06 MED ORDER — CIPROFLOXACIN HCL 500 MG PO TABS: 500.0000 mg | ORAL_TABLET | Freq: Two times a day (BID) | ORAL | 0 refills | Status: DC

## 2019-08-06 NOTE — Progress Notes (Signed)
Office Visit Note   Patient: [name]           Date of Birth: 1956/01/16           MRN: [id number] Visit Date: 08/06/2019              Requested by: Charlynn Court, NP [address] [place],  [place] [address] PCP: Charlynn Court, NP  Chief Complaint  Patient presents with  . Left Foot - Follow-up      HPI: Patient is a 63 year old gentleman who presents in follow-up for ulcer plantar aspect left heel.  Patient has had some venous stasis swelling and he was wrapped in a boot and Dynaflex wrap.  Patient is completed a course of doxycycline.  Patient states that recently his diabetes has been uncontrolled despite insulin use.  Assessment & Plan: Visit Diagnoses:  1. Non-pressure chronic ulcer of left heel and midfoot limited to breakdown of skin ([place])   2. Acute osteomyelitis of left calcaneus (HCC)     Plan: Patient has good resolution of the swelling in the left leg we will have him start with Dial soap cleansing and dressing changes daily we will start him on Cipro 500 mg twice a day.  Patient will minimize weightbearing.  Recommended proceeding with MRI scan to determine extent of osteomyelitis of the calcaneus and patient states he would like to hold on this at this time.  Follow-Up Instructions: Return in about 1 week (around 08/13/2019).   Ortho Exam  Patient is alert, oriented, no adenopathy, well-dressed, normal affect, normal respiratory effort. Examination patient does not have a palpable dorsalis pedis pulse the Doppler was used he has a triphasic dorsalis pedis and biphasic posterior tibial pulse.  Examination the ulcer it is larger and deeper the ulcer is 3 cm in diameter 1 cm deep.  This was probed with silver nitrate and probes down to bone.  There is no surrounding cellulitis no purulent drainage.  Imaging: Xr Os Calcis Left  Result Date: 08/06/2019 Lateral radiograph of the left calcaneus shows ulcer extending down to bone there is calcification of the arteries  down to the calcaneus.  No destructive bony changes.  No images are attached to the encounter.  Labs: No results found for: HGBA1C, ESRSEDRATE, CRP, LABURIC, REPTSTATUS, GRAMSTAIN, CULT, LABORGA   No results found for: ALBUMIN, PREALBUMIN, LABURIC  No results found for: MG No results found for: VD25OH  No results found for: PREALBUMIN CBC EXTENDED Latest Ref Rng & Units 06/10/2018 06/09/2018  WBC 4.0 - 10.5 K/uL 5.3 7.2  RBC 4.22 - 5.81 MIL/uL 4.27 4.57  HGB 13.0 - 17.0 g/dL 11.9(L) 12.9(L)  HCT 39.0 - 52.0 % 34.2(L) 36.6(L)  PLT 150 - 400 K/uL 146(L) 180  NEUTROABS 1.7 - 7.7 K/uL - 5.9  LYMPHSABS 0.7 - 4.0 K/uL - 0.8     Body mass index is 34.72 kg/m.  Orders:  Orders Placed This Encounter  Procedures  . XR Os Calcis Left   Meds ordered this encounter  Medications  . ciprofloxacin (CIPRO) 500 MG tablet    Sig: Take 1 tablet (500 mg total) by mouth 2 (two) times daily.    Dispense:  60 tablet    Refill:  0     Procedures: No procedures performed  Clinical Data: No additional findings.  ROS:  All other systems negative, except as noted in the HPI. Review of Systems  Objective: Vital Signs: Ht 6' (1.829 m)   Wt 256  lb (116.1 kg)   BMI 34.72 kg/m   Specialty Comments:  No specialty comments available.  PMFS History: Patient Active Problem List   Diagnosis Date Noted  . Cubital tunnel syndrome 08/29/2018  . AKI (acute kidney injury) (HCC) 06/09/2018  . DM2 (diabetes mellitus, type 2) (HCC) 06/09/2018  . HTN (hypertension) 06/09/2018  . Dehydration 06/09/2018  . Diabetic peripheral neuropathy (HCC) 09/08/2015  . Ulcer of right foot with fat layer exposed (HCC) 09/08/2015  . Primary osteoarthritis of left knee 10/23/2014  . Tear of medial meniscus of left knee, initial encounter 10/23/2014   Past Medical History:  Diagnosis Date  . Diabetic foot ulcers (HCC)   . DM2 (diabetes mellitus, type 2) (HCC)   . HTN (hypertension)     History reviewed.  No pertinent family history.  History reviewed. No pertinent surgical history. Social History   Occupational History  . Not on file  Tobacco Use  . Smoking status: Never Smoker  . Smokeless tobacco: Never Used  Substance and Sexual Activity  . Alcohol use: Yes    Comment: rarely  . Drug use: Never  . Sexual activity: Not on file

## 2019-08-06 NOTE — Telephone Encounter (Signed)
Patient is in for his appt today and will discuss this with Dr Sharol Given.

## 2019-08-13 ENCOUNTER — Ambulatory Visit: Payer: BC Managed Care – PPO | Admitting: Orthopedic Surgery

## 2019-08-21 ENCOUNTER — Encounter: Payer: Self-pay | Admitting: Orthopedic Surgery

## 2019-08-21 ENCOUNTER — Ambulatory Visit (INDEPENDENT_AMBULATORY_CARE_PROVIDER_SITE_OTHER): Payer: BC Managed Care – PPO | Admitting: Orthopedic Surgery

## 2019-08-21 VITALS — Ht 72.0 in | Wt 256.0 lb

## 2019-08-21 DIAGNOSIS — L97421 Non-pressure chronic ulcer of left heel and midfoot limited to breakdown of skin: Secondary | ICD-10-CM

## 2019-09-04 ENCOUNTER — Ambulatory Visit: Payer: BC Managed Care – PPO | Admitting: Orthopedic Surgery

## 2019-09-11 ENCOUNTER — Encounter: Payer: Self-pay | Admitting: Orthopedic Surgery

## 2019-09-11 NOTE — Progress Notes (Signed)
Office Visit Note   Patient: [name]           Date of Birth: 30-Dec-1955           MRN: [id number] Visit Date: 08/21/2019              Requested by: Charlynn Court, NP [address] [place],  [place] [address] PCP: Charlynn Court, NP  Chief Complaint  Patient presents with  . Left Foot - Follow-up      HPI: Patient is a 63 year old gentleman who presents in follow-up for Wegner grade 1 ulcer left foot.  Patient is currently wearing crocs full weightbearing and is on Cipro twice a day.  Assessment & Plan: Visit Diagnoses:  1. Non-pressure chronic ulcer of left heel and midfoot limited to breakdown of skin ([place])     Plan: Recommended a trial of a kneeling scooter to further unload his foot.  4 x 4 and Iodosorb dressing was applied with an Ace wrap.  Discussed that if we cannot offload the ulcer enough we would need to consider surgery for surgical debridement and skin grafting.  Follow-Up Instructions: Return in about 2 weeks (around 09/04/2019).   Ortho Exam  Patient is alert, oriented, no adenopathy, well-dressed, normal affect, normal respiratory effort. Examination patient has a good dorsalis pedis pulse he has a Wegner grade 1 ulcer on the plantar aspect of the left foot.  The ulcer is 2 cm in diameter 1 cm deep with healthy granulation tissue.  No redness no cellulitis no drainage no exposed bone or tendon.  Imaging: No results found. No images are attached to the encounter.  Labs: No results found for: HGBA1C, ESRSEDRATE, CRP, LABURIC, REPTSTATUS, GRAMSTAIN, CULT, LABORGA   No results found for: ALBUMIN, PREALBUMIN, LABURIC  No results found for: MG No results found for: VD25OH  No results found for: PREALBUMIN CBC EXTENDED Latest Ref Rng & Units 06/10/2018 06/09/2018  WBC 4.0 - 10.5 K/uL 5.3 7.2  RBC 4.22 - 5.81 MIL/uL 4.27 4.57  HGB 13.0 - 17.0 g/dL 11.9(L) 12.9(L)  HCT 39.0 - 52.0 % 34.2(L) 36.6(L)  PLT 150 - 400 K/uL 146(L) 180  NEUTROABS 1.7 - 7.7 K/uL -  5.9  LYMPHSABS 0.7 - 4.0 K/uL - 0.8     Body mass index is 34.72 kg/m.  Orders:  No orders of the defined types were placed in this encounter.  No orders of the defined types were placed in this encounter.    Procedures: No procedures performed  Clinical Data: No additional findings.  ROS:  All other systems negative, except as noted in the HPI. Review of Systems  Objective: Vital Signs: Ht 6' (1.829 m)   Wt 256 lb (116.1 kg)   BMI 34.72 kg/m   Specialty Comments:  No specialty comments available.  PMFS History: Patient Active Problem List   Diagnosis Date Noted  . Cubital tunnel syndrome 08/29/2018  . AKI (acute kidney injury) ([place]) 06/09/2018  . DM2 (diabetes mellitus, type 2) ([place]) 06/09/2018  . HTN (hypertension) 06/09/2018  . Dehydration 06/09/2018  . Diabetic peripheral neuropathy ([place]) 09/08/2015  . Ulcer of right foot with fat layer exposed ([place]) 09/08/2015  . Primary osteoarthritis of left knee 10/23/2014  . Tear of medial meniscus of left knee, initial encounter 10/23/2014   Past Medical History:  Diagnosis Date  . Diabetic foot ulcers ([place])   . DM2 (diabetes mellitus, type 2) ([place])   . HTN (hypertension)     History reviewed.  No pertinent family history.  History reviewed. No pertinent surgical history. Social History   Occupational History  . Not on file  Tobacco Use  . Smoking status: Never Smoker  . Smokeless tobacco: Never Used  Substance and Sexual Activity  . Alcohol use: Yes    Comment: rarely  . Drug use: Never  . Sexual activity: Not on file

## 2019-09-13 ENCOUNTER — Ambulatory Visit: Payer: BC Managed Care – PPO | Admitting: Orthopedic Surgery

## 2019-09-17 ENCOUNTER — Encounter: Payer: Self-pay | Admitting: Orthopedic Surgery

## 2019-09-17 ENCOUNTER — Other Ambulatory Visit: Payer: Self-pay

## 2019-09-17 ENCOUNTER — Ambulatory Visit (INDEPENDENT_AMBULATORY_CARE_PROVIDER_SITE_OTHER): Payer: BC Managed Care – PPO | Admitting: Orthopedic Surgery

## 2019-09-17 DIAGNOSIS — L97421 Non-pressure chronic ulcer of left heel and midfoot limited to breakdown of skin: Secondary | ICD-10-CM

## 2019-09-17 DIAGNOSIS — M86172 Other acute osteomyelitis, left ankle and foot: Secondary | ICD-10-CM | POA: Diagnosis not present

## 2019-09-17 NOTE — Progress Notes (Signed)
Office Visit Note   Patient: [name]           Date of Birth: [birth year]           MRN: [id number] Visit Date: 09/17/2019              Requested by: Hal Morales, NP [address] [place],  [place] [address] PCP: Hal Morales, NP  Chief Complaint  Patient presents with  . Foot Ulcer    left foot       HPI: Patient is a 63 year old gentleman who has had a left heel ulcer for about a year patient is been strict nonweightbearing on a kneeling scooter he has been on oral antibiotics he has undergone serial debridements as well as wound care.  Despite prolonged conservative therapy patient's ulcer is worsening.  Assessment & Plan: Visit Diagnoses:  1. Acute osteomyelitis of left calcaneus (HCC)   2. Non-pressure chronic ulcer of left heel and midfoot limited to breakdown of skin (HCC)     Plan: Discussed with patient recommendation to proceed with partial calcaneal excision local tissue rearrangement for wound closure secondary to excision of the chronic ulcer.  Risk and benefits were discussed including persistent ulceration nonhealing of the wound need for additional surgery.  Patient states he understands wished to proceed at this time.  Follow-Up Instructions: Return in about 2 weeks (around 10/01/2019).   Ortho Exam  Patient is alert, oriented, no adenopathy, well-dressed, normal affect, normal respiratory effort. Examination patient has a strong posterior tibial pulse.  The skin is indurated around the heel at this time.  The ulcer is 3 cm in diameter 1 cm deep.  After informed consent a 10 blade knife was used to debride the skin and soft tissue back to healthy viable bleeding tissue this was touched with silver nitrate the wound was 3-1/2 cm diameter after debridement 1 cm deep.  The ulcer does not probe to bone.  There is no purulent drainage he does have venous stasis changes.  Imaging: No results found. No images are attached to the encounter.  Labs: No results  found for: HGBA1C, ESRSEDRATE, CRP, LABURIC, REPTSTATUS, GRAMSTAIN, CULT, LABORGA   No results found for: ALBUMIN, PREALBUMIN, LABURIC  No results found for: MG No results found for: VD25OH  No results found for: PREALBUMIN CBC EXTENDED Latest Ref Rng & Units 06/10/2018 06/09/2018  WBC 4.0 - 10.5 K/uL 5.3 7.2  RBC 4.22 - 5.81 MIL/uL 4.27 4.57  HGB 13.0 - 17.0 g/dL 11.9(L) 12.9(L)  HCT 39.0 - 52.0 % 34.2(L) 36.6(L)  PLT 150 - 400 K/uL 146(L) 180  NEUTROABS 1.7 - 7.7 K/uL - 5.9  LYMPHSABS 0.7 - 4.0 K/uL - 0.8     There is no height or weight on file to calculate BMI.  Orders:  No orders of the defined types were placed in this encounter.  No orders of the defined types were placed in this encounter.    Procedures: No procedures performed  Clinical Data: No additional findings.  ROS:  All other systems negative, except as noted in the HPI. Review of Systems  Objective: Vital Signs: There were no vitals taken for this visit.  Specialty Comments:  No specialty comments available.  PMFS History: Patient Active Problem List   Diagnosis Date Noted  . Cubital tunnel syndrome 08/29/2018  . AKI (acute kidney injury) (HCC) 06/09/2018  . DM2 (diabetes mellitus, type 2) (HCC) 06/09/2018  . HTN (hypertension) 06/09/2018  . Dehydration 06/09/2018  .  Diabetic peripheral neuropathy ([place]) 09/08/2015  . Ulcer of right foot with fat layer exposed ([place]) 09/08/2015  . Primary osteoarthritis of left knee 10/23/2014  . Tear of medial meniscus of left knee, initial encounter 10/23/2014   Past Medical History:  Diagnosis Date  . Diabetic foot ulcers ([place])   . DM2 (diabetes mellitus, type 2) ([place])   . HTN (hypertension)     History reviewed. No pertinent family history.  History reviewed. No pertinent surgical history. Social History   Occupational History  . Not on file  Tobacco Use  . Smoking status: Never Smoker  . Smokeless tobacco: Never Used  Substance and Sexual  Activity  . Alcohol use: Yes    Comment: rarely  . Drug use: Never  . Sexual activity: Not on file

## 2019-09-18 ENCOUNTER — Inpatient Hospital Stay (HOSPITAL_COMMUNITY): Admission: RE | Admit: 2019-09-18 | Payer: BC Managed Care – PPO | Source: Ambulatory Visit

## 2019-09-19 ENCOUNTER — Other Ambulatory Visit (HOSPITAL_COMMUNITY)
Admission: RE | Admit: 2019-09-19 | Discharge: 2019-09-19 | Disposition: A | Discharge status: Home / Self Care | Payer: BC Managed Care – PPO | Source: Ambulatory Visit | Attending: Orthopedic Surgery | Admitting: Orthopedic Surgery

## 2019-09-19 ENCOUNTER — Encounter (HOSPITAL_COMMUNITY): Payer: Self-pay

## 2019-09-19 ENCOUNTER — Other Ambulatory Visit: Payer: Self-pay

## 2019-09-19 DIAGNOSIS — Z01812 Encounter for preprocedural laboratory examination: Secondary | ICD-10-CM | POA: Diagnosis not present

## 2019-09-19 DIAGNOSIS — Z20828 Contact with and (suspected) exposure to other viral communicable diseases: Secondary | ICD-10-CM | POA: Insufficient documentation

## 2019-09-19 LAB — SARS CORONAVIRUS 2 (TAT 6-24 HRS): SARS Coronavirus 2: NEGATIVE

## 2019-09-19 NOTE — Progress Notes (Signed)
PCP - Dr. Erasmo Leventhal  Cardiologist - Denies  Endocrin- Dr. Lind Guest  Chest x-ray - Denies  EKG - DOS-09/21/2019  Stress Test - 5 yrs ago a  Hospital-req'd  ECHO - Denies  Cardiac Cath - Denies  AICD-na PM-na LOOP-na  Sleep Study - Yes- Positive CPAP - Denies  LABS- 09/21/2019: CBC, BMP 09/19/2019: COVID  ASA-LD-010/8  ERAS- Yes- no drink, clears until 0600  HA1C- 09/2019: 8.8, per pt-req'd Fasting Blood Sugar - 66-280 Checks Blood Sugar __2___ times a day  Anesthesia- Yes- elevated A1C  Pt denies having chest pain, sob, or fever during the pre-op phone call. All instructions explained to the pt, with a verbal understanding of the material, including as of today, stop taking all Aspirin (unless instructed by your doctor) and Other Aspirin containing products, Vitamins, Fish oils, and Herbal medications. Also stop all NSAIDS i.e. Advil, Ibuprofen, Motrin, Aleve, Anaprox, Naproxen, BC, Goody Powders, and all Supplements.    . THE NIGHT BEFORE SURGERY, take _____35______ units of ____70/30_______insulin.      . If your CBG is greater than 220 mg/dL, you may take  of your Novolog (5 units) dose of insulin.  How do I manage my blood sugar before surgery? . Check your blood sugar at least 4 times a day, starting 2 days before surgery, to make sure that the level is not too high or low. o Check your blood sugar the morning of your surgery when you wake up and every 2 hours until you get to the Short Stay unit. . If your blood sugar is less than 70 mg/dL, you will need to treat for low blood sugar: o Do not take insulin. o Treat a low blood sugar (less than 70 mg/dL) with  cup of clear juice (cranberry or apple), 4 glucose tablets, OR glucose gel. Recheck blood sugar in 15 minutes after treatment (to make sure it is greater than 70 mg/dL). If your blood sugar is not greater than 70 mg/dL on recheck, call [phone] o  for further instructions. . If you are  admitted to the hospital after surgery: o Your blood sugar will be checked by the staff and you will probably be given insulin after surgery (instead of oral diabetes medicines) to make sure you have good blood sugar levels. o The goal for blood sugar control after surgery is 80-180 mg/dL.   Reviewed and Endorsed by [name] Patient Education Committee, August 2015   Pt also instructed to self quarantine after being tested for COVID-19. The opportunity to ask questions was provided.   Coronavirus Screening  Have you experienced the following symptoms:  Cough yes/no: No Fever (>100.73F)  yes/no: No Runny nose yes/no: No Sore throat yes/no: No Difficulty breathing/shortness of breath  yes/no: No  Have you or a family member traveled in the last 14 days and where? yes/no: No   If the patient indicates "YES" to the above questions, their PAT will be rescheduled to limit the exposure to others and, the surgeon will be notified. THE PATIENT WILL NEED TO BE ASYMPTOMATIC FOR 14 DAYS.   If the patient is not experiencing any of these symptoms, the PAT nurse will instruct them to NOT bring anyone with them to their appointment since they may have these symptoms or traveled as well.   Please remind your patients and families that hospital visitation restrictions are in effect and the importance of the restrictions.

## 2019-09-20 ENCOUNTER — Telehealth: Payer: Self-pay | Admitting: Nurse Practitioner

## 2019-09-20 NOTE — Anesthesia Preprocedure Evaluation (Addendum)
Anesthesia Evaluation  Patient identified by MRN, date of birth, ID band Patient awake    Reviewed: Allergy & Precautions, NPO status , Patient's Chart, lab work & pertinent test results  Airway Mallampati: II  TM Distance: >3 FB Neck ROM: Full    Dental  (+) Edentulous Upper, Missing   Pulmonary sleep apnea ,    Pulmonary exam normal breath sounds clear to auscultation       Cardiovascular hypertension, Pt. on medications Normal cardiovascular exam Rhythm:Regular Rate:Normal  ECG: SR, rate 94   Neuro/Psych  Neuromuscular disease negative psych ROS   GI/Hepatic negative GI ROS, Neg liver ROS,   Endo/Other  diabetes, Insulin Dependent  Renal/GU negative Renal ROS     Musculoskeletal negative musculoskeletal ROS (+)   Abdominal   Peds  Hematology  (+) anemia , HLD   Anesthesia Other Findings Osteomyelitis Left Calcaneous  Reproductive/Obstetrics                            Anesthesia Physical Anesthesia Plan  ASA: III  Anesthesia Plan: Regional   Post-op Pain Management:    Induction: Intravenous  PONV Risk Score and Plan: 1 and Ondansetron, Midazolam, Propofol infusion and Treatment may vary due to age or medical condition  Airway Management Planned: Simple Face Mask  Additional Equipment:   Intra-op Plan:   Post-operative Plan:   Informed Consent: I have reviewed the patients History and Physical, chart, labs and discussed the procedure including the risks, benefits and alternatives for the proposed anesthesia with the patient or authorized representative who has indicated his/her understanding and acceptance.     Dental advisory given  Plan Discussed with: CRNA  Anesthesia Plan Comments: (Per PA-C: Pt reported stress test done at [facility]. Records received show pt had exercise stress test in 2009 that was normal, no ischemia. Copy on pt chart.)       Anesthesia Quick Evaluation

## 2019-09-20 NOTE — Telephone Encounter (Signed)
Negative COVID results given. Patient results "NOT Detected." Caller expressed understanding. ° °

## 2019-09-21 ENCOUNTER — Ambulatory Visit (HOSPITAL_COMMUNITY): Payer: BC Managed Care – PPO | Admitting: Physician Assistant

## 2019-09-21 ENCOUNTER — Encounter (HOSPITAL_COMMUNITY): Payer: Self-pay | Admitting: *Deleted

## 2019-09-21 ENCOUNTER — Other Ambulatory Visit: Payer: Self-pay

## 2019-09-21 ENCOUNTER — Encounter (HOSPITAL_COMMUNITY)
Admission: RE | Disposition: A | Discharge status: Home / Self Care | Payer: Self-pay | Source: Home / Self Care | Attending: Orthopedic Surgery

## 2019-09-21 ENCOUNTER — Observation Stay (HOSPITAL_COMMUNITY)
Admission: RE | Admit: 2019-09-21 | Discharge: 2019-09-22 | Disposition: A | Discharge status: Home / Self Care | Payer: BC Managed Care – PPO | Attending: Orthopedic Surgery | Admitting: Orthopedic Surgery

## 2019-09-21 DIAGNOSIS — Z794 Long term (current) use of insulin: Secondary | ICD-10-CM | POA: Insufficient documentation

## 2019-09-21 DIAGNOSIS — L97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: Secondary | ICD-10-CM | POA: Diagnosis not present

## 2019-09-21 DIAGNOSIS — E1140 Type 2 diabetes mellitus with diabetic neuropathy, unspecified: Secondary | ICD-10-CM | POA: Diagnosis not present

## 2019-09-21 DIAGNOSIS — Z792 Long term (current) use of antibiotics: Secondary | ICD-10-CM | POA: Diagnosis not present

## 2019-09-21 DIAGNOSIS — E11621 Type 2 diabetes mellitus with foot ulcer: Secondary | ICD-10-CM | POA: Diagnosis not present

## 2019-09-21 DIAGNOSIS — Z7982 Long term (current) use of aspirin: Secondary | ICD-10-CM | POA: Insufficient documentation

## 2019-09-21 DIAGNOSIS — E785 Hyperlipidemia, unspecified: Secondary | ICD-10-CM | POA: Insufficient documentation

## 2019-09-21 DIAGNOSIS — M86172 Other acute osteomyelitis, left ankle and foot: Secondary | ICD-10-CM | POA: Diagnosis not present

## 2019-09-21 DIAGNOSIS — M869 Osteomyelitis, unspecified: Secondary | ICD-10-CM | POA: Diagnosis present

## 2019-09-21 DIAGNOSIS — I10 Essential (primary) hypertension: Secondary | ICD-10-CM | POA: Insufficient documentation

## 2019-09-21 DIAGNOSIS — Z79899 Other long term (current) drug therapy: Secondary | ICD-10-CM | POA: Diagnosis not present

## 2019-09-21 DIAGNOSIS — G4730 Sleep apnea, unspecified: Secondary | ICD-10-CM | POA: Insufficient documentation

## 2019-09-21 HISTORY — PX: I & D EXTREMITY: SHX5045

## 2019-09-21 HISTORY — DX: Sleep apnea, unspecified: G47.30

## 2019-09-21 LAB — BASIC METABOLIC PANEL
Anion gap: 7 (ref 5–15)
BUN: 18 mg/dL (ref 8–23)
CO2: 23 mmol/L (ref 22–32)
Calcium: 8.6 mg/dL — ABNORMAL LOW (ref 8.9–10.3)
Chloride: 107 mmol/L (ref 98–111)
Creatinine, Ser: 1.03 mg/dL (ref 0.61–1.24)
GFR calc Af Amer: >60 mL/min (ref >60)
GFR calc non Af Amer: >60 mL/min (ref >60)
Glucose, Bld: 316 mg/dL — ABNORMAL HIGH (ref 70–99)
Potassium: 4.4 mmol/L (ref 3.5–5.1)
Sodium: 137 mmol/L (ref 135–145)

## 2019-09-21 LAB — GLUCOSE, CAPILLARY
Glucose-Capillary: 334 mg/dL — ABNORMAL HIGH (ref 70–99)
Glucose-Capillary: 271 mg/dL — ABNORMAL HIGH (ref 70–99)
Glucose-Capillary: 262 mg/dL — ABNORMAL HIGH (ref 70–99)
Glucose-Capillary: 236 mg/dL — ABNORMAL HIGH (ref 70–99)
Glucose-Capillary: 216 mg/dL — ABNORMAL HIGH (ref 70–99)
Glucose-Capillary: 272 mg/dL — ABNORMAL HIGH (ref 70–99)
Glucose-Capillary: 171 mg/dL — ABNORMAL HIGH (ref 70–99)

## 2019-09-21 LAB — CBC
HCT: 38.7 % — ABNORMAL LOW (ref 39.0–52.0)
Hemoglobin: 12.9 g/dL — ABNORMAL LOW (ref 13.0–17.0)
MCH: 27.4 pg (ref 26.0–34.0)
MCHC: 33.3 g/dL (ref 30.0–36.0)
MCV: 82.3 fL (ref 80.0–100.0)
Platelets: 204 10*3/uL (ref 150–400)
RBC: 4.7 MIL/uL (ref 4.22–5.81)
RDW: 13.2 % (ref 11.5–15.5)
WBC: 5.1 10*3/uL (ref 4.0–10.5)
nRBC: 0 % (ref 0.0–0.2)

## 2019-09-21 LAB — HEMOGLOBIN A1C
Hgb A1c MFr Bld: 8.5 % — ABNORMAL HIGH (ref 4.8–5.6)
Mean Plasma Glucose: 197.25 mg/dL

## 2019-09-21 SURGERY — IRRIGATION AND DEBRIDEMENT EXTREMITY
Anesthesia: Regional | Laterality: Left

## 2019-09-21 MED ORDER — FENTANYL CITRATE (PF) 250 MCG/5ML IJ SOLN
INTRAMUSCULAR | Status: AC
  Filled 2019-09-21: qty 5

## 2019-09-21 MED ORDER — LACTATED RINGERS IV BOLUS
500.0000 mL | Freq: Once | INTRAVENOUS | Status: AC
  Administered 2019-09-21: 500 mL via INTRAVENOUS

## 2019-09-21 MED ORDER — ONDANSETRON HCL 4 MG PO TABS: 4.0000 mg | ORAL_TABLET | Freq: Four times a day (QID) | ORAL | Status: DC | PRN

## 2019-09-21 MED ORDER — FENTANYL CITRATE (PF) 100 MCG/2ML IJ SOLN: 25.0000 ug | INTRAMUSCULAR | Status: DC | PRN

## 2019-09-21 MED ORDER — 0.9 % SODIUM CHLORIDE (POUR BTL) OPTIME
TOPICAL | Status: DC | PRN
  Administered 2019-09-21: 12:00:00 1000 mL

## 2019-09-21 MED ORDER — METOCLOPRAMIDE HCL 5 MG/ML IJ SOLN: 5.0000 mg | Freq: Three times a day (TID) | INTRAMUSCULAR | Status: DC | PRN

## 2019-09-21 MED ORDER — INSULIN GLARGINE 100 UNIT/ML ~~LOC~~ SOLN
70.0000 [IU] | Freq: Every day | SUBCUTANEOUS | Status: DC
  Administered 2019-09-21: 70 [IU] via SUBCUTANEOUS
  Filled 2019-09-21 (×2): qty 0.7

## 2019-09-21 MED ORDER — PHENYLEPHRINE 40 MCG/ML (10ML) SYRINGE FOR IV PUSH (FOR BLOOD PRESSURE SUPPORT)
PREFILLED_SYRINGE | INTRAVENOUS | Status: AC
  Filled 2019-09-21: qty 10

## 2019-09-21 MED ORDER — FENTANYL CITRATE (PF) 250 MCG/5ML IJ SOLN
INTRAMUSCULAR | Status: DC | PRN
  Administered 2019-09-21: 50 ug via INTRAVENOUS

## 2019-09-21 MED ORDER — LIDOCAINE-EPINEPHRINE (PF) 1.5 %-1:200000 IJ SOLN
INTRAMUSCULAR | Status: DC | PRN
  Administered 2019-09-21: 30 mL via PERINEURAL

## 2019-09-21 MED ORDER — FUROSEMIDE 40 MG PO TABS
40.0000 mg | ORAL_TABLET | Freq: Every day | ORAL | Status: DC
  Administered 2019-09-22: 11:00:00 40 mg via ORAL
  Filled 2019-09-21 (×2): qty 1

## 2019-09-21 MED ORDER — INSULIN ASPART 100 UNIT/ML ~~LOC~~ SOLN
0.0000 [IU] | Freq: Three times a day (TID) | SUBCUTANEOUS | Status: DC
  Administered 2019-09-21: 5 [IU] via SUBCUTANEOUS
  Administered 2019-09-22: 15:00:00 2 [IU] via SUBCUTANEOUS

## 2019-09-21 MED ORDER — HYDROMORPHONE HCL 1 MG/ML IJ SOLN: 0.5000 mg | INTRAMUSCULAR | Status: DC | PRN

## 2019-09-21 MED ORDER — PROPOFOL 10 MG/ML IV BOLUS
INTRAVENOUS | Status: AC
  Filled 2019-09-21: qty 20

## 2019-09-21 MED ORDER — CHLORHEXIDINE GLUCONATE 4 % EX LIQD: 60.0000 mL | Freq: Once | CUTANEOUS | Status: DC

## 2019-09-21 MED ORDER — LACTATED RINGERS IV SOLN
INTRAVENOUS | Status: DC | PRN
  Administered 2019-09-21 (×2): via INTRAVENOUS

## 2019-09-21 MED ORDER — PROPOFOL 10 MG/ML IV BOLUS
INTRAVENOUS | Status: DC | PRN
  Administered 2019-09-21: 10 mg via INTRAVENOUS

## 2019-09-21 MED ORDER — LACTATED RINGERS IV SOLN
INTRAVENOUS | Status: DC
  Administered 2019-09-21: 08:00:00 via INTRAVENOUS

## 2019-09-21 MED ORDER — ONDANSETRON HCL 4 MG/2ML IJ SOLN: 4.0000 mg | Freq: Four times a day (QID) | INTRAMUSCULAR | Status: DC | PRN

## 2019-09-21 MED ORDER — PROPOFOL 500 MG/50ML IV EMUL
INTRAVENOUS | Status: DC | PRN
  Administered 2019-09-21: 100 ug/kg/min via INTRAVENOUS
  Administered 2019-09-21: 12:00:00 via INTRAVENOUS

## 2019-09-21 MED ORDER — ACETAMINOPHEN 500 MG PO TABS
1000.0000 mg | ORAL_TABLET | Freq: Once | ORAL | Status: AC
  Administered 2019-09-21: 1000 mg via ORAL
  Filled 2019-09-21: qty 2

## 2019-09-21 MED ORDER — MIDAZOLAM HCL 2 MG/2ML IJ SOLN
INTRAMUSCULAR | Status: AC
  Administered 2019-09-21: 10:00:00 1 mg
  Filled 2019-09-21: qty 2

## 2019-09-21 MED ORDER — ONDANSETRON HCL 4 MG/2ML IJ SOLN
INTRAMUSCULAR | Status: DC | PRN
  Administered 2019-09-21: 4 mg via INTRAVENOUS

## 2019-09-21 MED ORDER — ONDANSETRON HCL 4 MG/2ML IJ SOLN
INTRAMUSCULAR | Status: AC
  Filled 2019-09-21: qty 2

## 2019-09-21 MED ORDER — INSULIN ASPART 100 UNIT/ML ~~LOC~~ SOLN
7.0000 [IU] | Freq: Once | SUBCUTANEOUS | Status: AC
  Administered 2019-09-21: 7 [IU] via SUBCUTANEOUS
  Filled 2019-09-21: qty 1

## 2019-09-21 MED ORDER — ASPIRIN EC 81 MG PO TBEC
81.0000 mg | DELAYED_RELEASE_TABLET | Freq: Every day | ORAL | Status: DC
  Administered 2019-09-22: 11:00:00 81 mg via ORAL
  Filled 2019-09-21: qty 1

## 2019-09-21 MED ORDER — LOSARTAN POTASSIUM 50 MG PO TABS
50.0000 mg | ORAL_TABLET | Freq: Every day | ORAL | Status: DC
  Administered 2019-09-21 – 2019-09-22 (×2): 50 mg via ORAL
  Filled 2019-09-21 (×2): qty 1

## 2019-09-21 MED ORDER — OXYCODONE HCL 5 MG PO TABS: 10.0000 mg | ORAL_TABLET | ORAL | Status: DC | PRN

## 2019-09-21 MED ORDER — CEFAZOLIN SODIUM-DEXTROSE 2-4 GM/100ML-% IV SOLN
2.0000 g | Freq: Four times a day (QID) | INTRAVENOUS | Status: AC
  Administered 2019-09-21 – 2019-09-22 (×3): 2 g via INTRAVENOUS
  Filled 2019-09-21 (×3): qty 100

## 2019-09-21 MED ORDER — DEXTROSE 5 % IV SOLN
3.0000 g | INTRAVENOUS | Status: AC
  Administered 2019-09-21: 12:00:00 3 g via INTRAVENOUS
  Filled 2019-09-21: qty 3

## 2019-09-21 MED ORDER — OXYCODONE HCL 5 MG PO TABS
5.0000 mg | ORAL_TABLET | ORAL | Status: DC | PRN
  Filled 2019-09-21: qty 2

## 2019-09-21 MED ORDER — INSULIN ASPART PROT & ASPART (70-30 MIX) 100 UNIT/ML ~~LOC~~ SUSP
40.0000 [IU] | Freq: Two times a day (BID) | SUBCUTANEOUS | Status: DC
  Administered 2019-09-21: 18:00:00 40 [IU] via SUBCUTANEOUS
  Filled 2019-09-21: qty 10

## 2019-09-21 MED ORDER — INSULIN ASPART 100 UNIT/ML ~~LOC~~ SOLN
10.0000 [IU] | Freq: Three times a day (TID) | SUBCUTANEOUS | Status: DC
  Administered 2019-09-21 – 2019-09-22 (×2): 10 [IU] via SUBCUTANEOUS

## 2019-09-21 MED ORDER — INSULIN DEGLUDEC 200 UNIT/ML ~~LOC~~ SOPN: 70.0000 [IU] | PEN_INJECTOR | Freq: Every day | SUBCUTANEOUS | Status: DC

## 2019-09-21 MED ORDER — ACETAMINOPHEN 500 MG PO TABS
1000.0000 mg | ORAL_TABLET | Freq: Four times a day (QID) | ORAL | Status: AC
  Administered 2019-09-21 – 2019-09-22 (×4): 1000 mg via ORAL
  Filled 2019-09-21 (×4): qty 2

## 2019-09-21 MED ORDER — MIDAZOLAM HCL 2 MG/2ML IJ SOLN
INTRAMUSCULAR | Status: AC
  Filled 2019-09-21: qty 2

## 2019-09-21 MED ORDER — LIDOCAINE 2% (20 MG/ML) 5 ML SYRINGE
INTRAMUSCULAR | Status: AC
  Filled 2019-09-21: qty 5

## 2019-09-21 MED ORDER — ATORVASTATIN CALCIUM 10 MG PO TABS
10.0000 mg | ORAL_TABLET | Freq: Every day | ORAL | Status: DC
  Administered 2019-09-21 – 2019-09-22 (×2): 10 mg via ORAL
  Filled 2019-09-21 (×2): qty 1

## 2019-09-21 MED ORDER — METOCLOPRAMIDE HCL 5 MG PO TABS: 5.0000 mg | ORAL_TABLET | Freq: Three times a day (TID) | ORAL | Status: DC | PRN

## 2019-09-21 MED ORDER — ACETAMINOPHEN 325 MG PO TABS
325.0000 mg | ORAL_TABLET | Freq: Four times a day (QID) | ORAL | Status: DC | PRN
  Filled 2019-09-21: qty 2

## 2019-09-21 MED ORDER — PHENYLEPHRINE 40 MCG/ML (10ML) SYRINGE FOR IV PUSH (FOR BLOOD PRESSURE SUPPORT)
PREFILLED_SYRINGE | INTRAVENOUS | Status: DC | PRN
  Administered 2019-09-21 (×5): 80 ug via INTRAVENOUS

## 2019-09-21 MED ORDER — DOCUSATE SODIUM 100 MG PO CAPS
100.0000 mg | ORAL_CAPSULE | Freq: Two times a day (BID) | ORAL | Status: DC
  Filled 2019-09-21 (×2): qty 1

## 2019-09-21 MED ORDER — ONDANSETRON HCL 4 MG/2ML IJ SOLN: 4.0000 mg | Freq: Once | INTRAMUSCULAR | Status: DC | PRN

## 2019-09-21 SURGICAL SUPPLY — 34 items
BLADE SURG 21 STRL SS (BLADE) ×2 IMPLANT
BNDG COHESIVE 6X5 TAN STRL LF (GAUZE/BANDAGES/DRESSINGS) IMPLANT
BNDG GAUZE ELAST 4 BULKY (GAUZE/BANDAGES/DRESSINGS) ×4 IMPLANT
COVER SURGICAL LIGHT HANDLE (MISCELLANEOUS) ×4 IMPLANT
COVER WAND RF STERILE (DRAPES) ×2 IMPLANT
DRAPE U-SHAPE 47X51 STRL (DRAPES) ×2 IMPLANT
DRESSING PREVENA PLUS CUSTOM (GAUZE/BANDAGES/DRESSINGS) IMPLANT
DRSG ADAPTIC 3X8 NADH LF (GAUZE/BANDAGES/DRESSINGS) ×2 IMPLANT
DRSG PREVENA PLUS CUSTOM (GAUZE/BANDAGES/DRESSINGS) ×2
DURAPREP 26ML APPLICATOR (WOUND CARE) ×2 IMPLANT
ELECT REM PT RETURN 9FT ADLT (ELECTROSURGICAL)
ELECTRODE REM PT RTRN 9FT ADLT (ELECTROSURGICAL) IMPLANT
GAUZE SPONGE 4X4 12PLY STRL (GAUZE/BANDAGES/DRESSINGS) ×2 IMPLANT
GLOVE BIOGEL PI IND STRL 9 (GLOVE) ×1 IMPLANT
GLOVE BIOGEL PI INDICATOR 9 (GLOVE) ×1
GLOVE SURG ORTHO 9.0 STRL STRW (GLOVE) ×2 IMPLANT
GOWN STRL REUS W/ TWL XL LVL3 (GOWN DISPOSABLE) ×2 IMPLANT
GOWN STRL REUS W/TWL XL LVL3 (GOWN DISPOSABLE) ×4
HANDPIECE INTERPULSE COAX TIP (DISPOSABLE)
KIT BASIN OR (CUSTOM PROCEDURE TRAY) ×2 IMPLANT
KIT DRSG PREVENA PLUS 7DAY 125 (MISCELLANEOUS) ×1 IMPLANT
KIT PREVENA INCISION MGT 13 (CANNISTER) IMPLANT
KIT TURNOVER KIT B (KITS) ×2 IMPLANT
MANIFOLD NEPTUNE II (INSTRUMENTS) ×2 IMPLANT
NS IRRIG 1000ML POUR BTL (IV SOLUTION) ×2 IMPLANT
PACK ORTHO EXTREMITY (CUSTOM PROCEDURE TRAY) ×2 IMPLANT
PAD ARMBOARD 7.5X6 YLW CONV (MISCELLANEOUS) ×4 IMPLANT
SET HNDPC FAN SPRY TIP SCT (DISPOSABLE) IMPLANT
STOCKINETTE IMPERVIOUS 9X36 MD (GAUZE/BANDAGES/DRESSINGS) IMPLANT
SWAB COLLECTION DEVICE MRSA (MISCELLANEOUS) ×2 IMPLANT
SWAB CULTURE ESWAB REG 1ML (MISCELLANEOUS) IMPLANT
TOWEL GREEN STERILE (TOWEL DISPOSABLE) ×2 IMPLANT
TUBE CONNECTING 12X1/4 (SUCTIONS) ×2 IMPLANT
YANKAUER SUCT BULB TIP NO VENT (SUCTIONS) ×2 IMPLANT

## 2019-09-21 NOTE — Evaluation (Signed)
Physical Therapy Evaluation Patient Details Name: [name] MRN: [id number] DOB: [birth year] Today's Date: 09/21/2019   History of Present Illness  Patient is a 63 year old male with L heel ulcer, found to have osteomylitis. Patient has PMH of DM, HTN  Clinical Impression  Patient received in bed, agrees to PT. Reports he has been using knee scooter for 2 weeks at home. Demonstrates bed mobility with mod independence, transfers with min assist. He is able to ambulate 30 feet in room using knee scooter with min guard. Patient will benefit from continued skilled PT to improve mobility and safety.     Follow Up Recommendations No PT follow up    Equipment Recommendations  None recommended by PT    Recommendations for Other Services       Precautions / Restrictions Precautions Precautions: Fall Precaution Comments: wound vac Restrictions Weight Bearing Restrictions: Yes LLE Weight Bearing: Non weight bearing      Mobility  Bed Mobility Overal bed mobility: Modified Independent             General bed mobility comments: use of bed rails  Transfers Overall transfer level: Needs assistance   Transfers: Sit to/from Stand Sit to Stand: Min assist         General transfer comment: requires assist to transfer sit to stand from bed to knee scooter  Ambulation/Gait Ambulation/Gait assistance: Min guard Gait Distance (Feet): 30 Feet Assistive device: (knee scooter) Gait Pattern/deviations: Step-to pattern Gait velocity: decr   General Gait Details: safe and careful with scooter  Stairs            Wheelchair Mobility    Modified Rankin (Stroke Patients Only)       Balance Overall balance assessment: Modified Independent                                           Pertinent Vitals/Pain Pain Assessment: No/denies pain    Home Living Family/patient expects to be discharged to:: Private residence Living Arrangements:  Spouse/significant other Available Help at Discharge: Family Type of Home: House Home Access: Level entry              Prior Function Level of Independence: Independent with assistive device(s)         Comments: has been using knee scooter for 2 weeks     Hand Dominance        Extremity/Trunk Assessment   Upper Extremity Assessment Upper Extremity Assessment: Overall WFL for tasks assessed    Lower Extremity Assessment Lower Extremity Assessment: Overall WFL for tasks assessed    Cervical / Trunk Assessment Cervical / Trunk Assessment: Normal  Communication   Communication: No difficulties  Cognition Arousal/Alertness: Awake/alert Behavior During Therapy: WFL for tasks assessed/performed Overall Cognitive Status: Within Functional Limits for tasks assessed                                        General Comments      Exercises     Assessment/Plan    PT Assessment Patient needs continued PT services  PT Problem List Decreased mobility       PT Treatment Interventions DME instruction;Therapeutic activities;Gait training;Therapeutic exercise;Functional mobility training;Patient/family education    PT Goals (Current goals can be found in the Care Plan section)  Acute Rehab PT Goals Patient Stated Goal: to return home tomorrow PT Goal Formulation: With patient Time For Goal Achievement: 09/28/19 Potential to Achieve Goals: Good    Frequency Min 2X/week   Barriers to discharge        Co-evaluation               AM-PAC PT "6 Clicks" Mobility  Outcome Measure Help needed turning from your back to your side while in a flat bed without using bedrails?: None Help needed moving from lying on your back to sitting on the side of a flat bed without using bedrails?: A Little Help needed moving to and from a bed to a chair (including a wheelchair)?: A Little Help needed standing up from a chair using your arms (e.g., wheelchair or  bedside chair)?: A Little Help needed to walk in hospital room?: A Little Help needed climbing 3-5 steps with a railing? : A Lot 6 Click Score: 18    End of Session Equipment Utilized During Treatment: Gait belt Activity Tolerance: Patient tolerated treatment well Patient left: in bed;with call bell/phone within reach Nurse Communication: Mobility status PT Visit Diagnosis: Other abnormalities of gait and mobility (R26.89)    Time: 3536-1443 PT Time Calculation (min) (ACUTE ONLY): 28 min   Charges:   PT Evaluation $PT Eval Moderate Complexity: 1 Mod PT Treatments $Gait Training: 8-22 mins        Daschel Roughton, PT, GCS 09/21/19,3:51 PM

## 2019-09-21 NOTE — Op Note (Signed)
09/21/2019  12:15 PM  PATIENT:  [name]    PRE-OPERATIVE DIAGNOSIS:  Osteomyelitis Left Calcaneous  POST-OPERATIVE DIAGNOSIS:  Same  PROCEDURE:  LEFT PARTIAL CALCANEOUS EXCISION Local tissue rearrangement for wound closure 5 x 10 cm. Application of customizable Praveena wound VAC.  SURGEON:  [name], MD  PHYSICIAN ASSISTANT:None ANESTHESIA:   General  PREOPERATIVE INDICATIONS:  [name] is a  63 y.o. male with a diagnosis of Osteomyelitis Left Calcaneous who failed conservative measures and elected for surgical management.    The risks benefits and alternatives were discussed with the patient preoperatively including but not limited to the risks of infection, bleeding, nerve injury, cardiopulmonary complications, the need for revision surgery, among others, and the patient was willing to proceed.  OPERATIVE IMPLANTS: Praveena customizable wound VAC.  @ENCIMAGES @  OPERATIVE FINDINGS: Ulceration extended down to the calcaneus.  The wound margins were clear no signs of abscess or infection at the level of the resection.  OPERATIVE PROCEDURE: Patient brought the operating room after undergoing a regional block the left lower extremity was then prepped using DuraPrep draped into a sterile field a timeout was called.  A elliptical incision was made around the ulcerative tissue this left a wound that was 5 x 10 cm.  Electrocautery was used for hemostasis.  An osteotome was used to resect the posterior 2 cm of the calcaneus.  This was resected back to healthy viable bleeding subchondral bone.  The wound was irrigated with normal saline electrocautery was used for hemostasis.  Local tissue rearrangement was used to close the wound that was 5 x 10 cm.  Incision was closed using 2-0 nylon.  A Praveena wound VAC was applied this had a good suction fit patient was taken the PACU in stable condition.  Anticipate discharge to home tomorrow.   DISCHARGE PLANNING:  Antibiotic  duration: 23 hours antibiotics  Weightbearing: Nonweightbearing on the left  Pain medication: Opioid pathway  Dressing care/ Wound VAC: Continue wound VAC for 1 week  Ambulatory devices: Walker or crutches  Discharge to: Home.  Follow-up: In the office 1 week post operative.

## 2019-09-21 NOTE — Plan of Care (Signed)
  Problem: Education: Goal: Knowledge of General Education information will improve Description: Including pain rating scale, medication(s)/side effects and non-pharmacologic comfort measures Outcome: Progressing   Problem: Clinical Measurements: Goal: Ability to maintain clinical measurements within normal limits will improve Outcome: Progressing   Problem: Coping: Goal: Level of anxiety will decrease Outcome: Progressing   Problem: Pain Managment: Goal: General experience of comfort will improve Outcome: Progressing   Problem: Safety: Goal: Ability to remain free from injury will improve Outcome: Progressing   Problem: Skin Integrity: Goal: Risk for impaired skin integrity will decrease Outcome: Progressing   

## 2019-09-21 NOTE — Transfer of Care (Signed)
Immediate Anesthesia Transfer of Care Note  Patient: [name]  Procedure(s) Performed: LEFT PARTIAL CALCANEOUS EXCISION (Left )  Patient Location: PACU  Anesthesia Type:MAC and Regional  Level of Consciousness: awake, alert  and oriented  Airway & Oxygen Therapy: Patient Spontanous Breathing  Post-op Assessment: Report given to RN, Post -op Vital signs reviewed and stable and Patient moving all extremities X 4  Post vital signs: Reviewed and stable  Last Vitals:  Vitals Value Taken Time  BP    Temp    Pulse    Resp    SpO2      Last Pain:  Vitals:   09/21/19 1020  TempSrc:   PainSc: 0-No pain      Patients Stated Pain Goal: 4 ([id number])  Complications: No apparent anesthesia complications

## 2019-09-21 NOTE — Progress Notes (Signed)
Pt. States wife's phone doesen't receive text messages.

## 2019-09-21 NOTE — Progress Notes (Signed)
Noted moderate drainage on Prevena wound vac (total of 200cc), switched to hospital wound vac per MD's order.

## 2019-09-21 NOTE — Anesthesia Postprocedure Evaluation (Signed)
Anesthesia Post Note  Patient: [name]  Procedure(s) Performed: LEFT PARTIAL CALCANEOUS EXCISION (Left )     Patient location during evaluation: PACU Anesthesia Type: Regional Level of consciousness: awake and alert Pain management: pain level controlled Vital Signs Assessment: post-procedure vital signs reviewed and stable Respiratory status: spontaneous breathing, nonlabored ventilation, respiratory function stable and patient connected to nasal cannula oxygen Cardiovascular status: stable and blood pressure returned to baseline Postop Assessment: no apparent nausea or vomiting Anesthetic complications: no    Last Vitals:  Vitals:   09/21/19 1316 09/21/19 1344  BP: (!) 142/77 (!) 146/86  Pulse: 84 87  Resp: (!) 21 17  Temp: (!) 36.2 C 36.7 C  SpO2: 100% 99%    Last Pain:  Vitals:   09/21/19 1600  TempSrc:   PainSc: 0-No pain                 Ryan P Ellender

## 2019-09-21 NOTE — Anesthesia Procedure Notes (Signed)
Anesthesia Regional Block: Ankle block   Pre-Anesthetic Checklist: ,, timeout performed, Correct Patient, Correct Site, Correct Laterality, Correct Procedure,, site marked, risks and benefits discussed, Surgical consent,  Pre-op evaluation,  At surgeon's request and post-op pain management  Laterality: Left  Prep: Dura Prep       Needles:  Injection technique: Single-shot  Needle Type: Echogenic Stimulator Needle     Needle Length: 9cm  Needle Gauge: 22     Additional Needles:   Procedures:,,,,, intact distal pulses,,,  Narrative:  Start time: 09/21/2019 10:45 AM End time: 09/21/2019 10:55 AM Injection made incrementally with aspirations every 5 mL.  Performed by: Personally  Anesthesiologist: Murvin Natal, MD  Additional Notes: Functioning IV was confirmed and monitors were applied.  A 71mm 22ga Quincke spinal needle was used. Sterile prep, hand hygiene and sterile gloves were used.  Negative aspiration and negative test dose prior to incremental administration of local anesthetic. The patient tolerated the procedure well.

## 2019-09-21 NOTE — H&P (Signed)
[name] is an 63 y.o. male.   Chief Complaint: Left heel ulcer/osteomyelitis of left calcaneous HPI: The patient is a 63 year old gentleman with a history of insensate diabetic neuropathy who has had an ulceration over his left heel for about 1 year.  He has been treated with oral antibiotics and has undergone serial debridements in the office as well as local wound wound care, and utilizing a knee scooter to offload the area.  Despite conservative treatment he has failed to improve and presents with purulent drainage and worsening of the ulceration.  He is admitted for partial calcaneal excision and local tissue rearrangement for wound closure secondary to this chronic heel ulceration.  Past Medical History:  Diagnosis Date  . Diabetic foot ulcers (HCC)   . DM2 (diabetes mellitus, type 2) (HCC)   . HTN (hypertension)   . Sleep apnea    No Cpap    Past Surgical History:  Procedure Laterality Date  . BACK SURGERY     Laminectomy-Bilateral  . EYE SURGERY     Bilateral cataracts and eye implants  . KNEE ARTHROSCOPY Left   . SHOULDER ARTHROSCOPY     Right    History reviewed. No pertinent family history. Social History:  reports that he has never smoked. He has never used smokeless tobacco. He reports current alcohol use. He reports that he does not use drugs.  Allergies: No Known Allergies  Medications Prior to Admission  Medication Sig Dispense Refill  . aspirin EC 81 MG tablet Take 81 mg by mouth daily.     Marland Kitchen atorvastatin (LIPITOR) 10 MG tablet Take 10 mg by mouth daily.     . furosemide (LASIX) 40 MG tablet Take 40 mg by mouth daily.     . insulin aspart protamine - aspart (NOVOLOG MIX 70/30 FLEXPEN) (70-30) 100 UNIT/ML FlexPen Inject 50 Units into the skin 2 (two) times daily with a meal.     . losartan (COZAAR) 50 MG tablet Take 50 mg by mouth daily.     Marland Kitchen NOVOLOG FLEXPEN 100 UNIT/ML FlexPen Inject 10 Units into the skin 3 (three) times daily with meals.     . potassium  chloride (K-DUR) 10 MEQ tablet Take 20 mEq by mouth daily.   0  . TRESIBA FLEXTOUCH 200 UNIT/ML SOPN Inject 80 Units into the skin daily.    . B-D UF III MINI PEN NEEDLES 31G X 5 MM MISC USE AS DIRECTED UP TO 3 TIMES DAILY    . ciprofloxacin (CIPRO) 500 MG tablet Take 1 tablet (500 mg total) by mouth 2 (two) times daily. 60 tablet 0  . clotrimazole-betamethasone (LOTRISONE) cream Apply fingertip amount to bottom of both feet daily. (Patient not taking: Reported on 09/17/2019) 30 g 0  . Continuous Blood Gluc Sensor (FREESTYLE LIBRE 14 DAY SENSOR) MISC Use to check blood glucose    . EPINEPHrine (EPIPEN 2-PAK) 0.3 mg/0.3 mL IJ SOAJ injection Inject 0.3 mg into the muscle as needed for anaphylaxis.       Results for orders placed or performed during the hospital encounter of 09/19/19 (from the past 48 hour(s))  SARS CORONAVIRUS 2 (TAT 6-24 HRS) Nasopharyngeal Nasopharyngeal Swab     Status: None   Collection Time: 09/19/19 10:36 AM   Specimen: Nasopharyngeal Swab  Result Value Ref Range   SARS Coronavirus 2 NEGATIVE NEGATIVE    Comment: (NOTE) SARS-CoV-2 target nucleic acids are NOT DETECTED. The SARS-CoV-2 RNA is generally detectable in upper and lower respiratory specimens during  the acute phase of infection. Negative results do not preclude SARS-CoV-2 infection, do not rule out co-infections with other pathogens, and should not be used as the sole basis for treatment or other patient management decisions. Negative results must be combined with clinical observations, patient history, and epidemiological information. The expected result is Negative. Fact Sheet for Patients: SugarRoll.be Fact Sheet for Healthcare Providers: https://www.woods-mathews.com/ This test is not yet approved or cleared by the [place] FDA and  has been authorized for detection and/or diagnosis of SARS-CoV-2 by FDA under an Emergency Use Authorization (EUA). This EUA  will remain  in effect (meaning this test can be used) for the duration of the COVID-19 declaration under Section 56 4(b)(1) of the Act, 21 U.S.C. section 360bbb-3(b)(1), unless the authorization is terminated or revoked sooner. Performed at [facility], [place] [address] [place], [place] [address]    No results found.  Review of Systems  All other systems reviewed and are negative.   Height 6\' 2"  (1.88 m), weight 115.7 kg. Physical Exam  Constitutional: He is oriented to person, place, and time. He appears well-developed and well-nourished. No distress.  HENT:  Head: Normocephalic and atraumatic.  Neck: No tracheal deviation present. No thyromegaly present.  Cardiovascular: Normal rate.  Respiratory: Effort normal. No stridor. No respiratory distress.  GI: Soft. He exhibits no distension.  Musculoskeletal:     Comments: Left foot-Examination patient has a strong posterior tibial pulse.  The skin is indurated around the heel at this time.  The ulcer is 3 cm in diameter 1 cm deep.  After informed consent a 10 blade knife was used to debride the skin and soft tissue back to healthy viable bleeding tissue this was touched with silver nitrate the wound was 3-1/2 cm diameter after debridement 1 cm deep.  The ulcer does not probe to bone.  There is no purulent drainage he does have venous stasis changes.  Neurological: He is alert and oriented to person, place, and time. No cranial nerve deficit. Coordination normal.  Skin: Skin is warm.  Psychiatric: He has a normal mood and affect. His behavior is normal. Judgment and thought content normal.     Assessment/Plan Left heel chronic ulceration/osteomyelitis left calcaneus- plan partial excision left calcaneus with tissue rearrangement for wound closure-the procedure and possible benefits and risk including the risk of bleeding, infection, neurovascular injury, and possible need for further surgery were discussed with the patient and his  questions were answered to his satisfaction.  The patient wishes to proceed with surgery at this time.  Erlinda Hong, PA-C 09/21/2019, 8:14 AM [facility] [phone]

## 2019-09-21 NOTE — Plan of Care (Signed)

## 2019-09-21 NOTE — Anesthesia Procedure Notes (Signed)
Procedure Name: MAC Date/Time: 09/21/2019 11:35 AM Performed by: Harden Mo, CRNA Pre-anesthesia Checklist: Patient identified, Emergency Drugs available, Suction available and Patient being monitored Patient Re-evaluated:Patient Re-evaluated prior to induction Oxygen Delivery Method: Simple face mask Preoxygenation: Pre-oxygenation with 100% oxygen Induction Type: IV induction Placement Confirmation: positive ETCO2 and breath sounds checked- equal and bilateral Dental Injury: Teeth and Oropharynx as per pre-operative assessment

## 2019-09-22 ENCOUNTER — Encounter (HOSPITAL_COMMUNITY): Payer: Self-pay | Admitting: Orthopedic Surgery

## 2019-09-22 DIAGNOSIS — M86172 Other acute osteomyelitis, left ankle and foot: Secondary | ICD-10-CM | POA: Diagnosis not present

## 2019-09-22 LAB — GLUCOSE, CAPILLARY
Glucose-Capillary: 55 mg/dL — ABNORMAL LOW (ref 70–99)
Glucose-Capillary: 101 mg/dL — ABNORMAL HIGH (ref 70–99)
Glucose-Capillary: 151 mg/dL — ABNORMAL HIGH (ref 70–99)

## 2019-09-22 MED ORDER — INSULIN ASPART 100 UNIT/ML ~~LOC~~ SOLN
6.0000 [IU] | Freq: Once | SUBCUTANEOUS | Status: AC
  Administered 2019-09-22: 09:00:00 6 [IU] via SUBCUTANEOUS

## 2019-09-22 MED ORDER — INSULIN ASPART PROT & ASPART (70-30 MIX) 100 UNIT/ML ~~LOC~~ SUSP
30.0000 [IU] | Freq: Once | SUBCUTANEOUS | Status: AC
  Administered 2019-09-22: 30 [IU] via SUBCUTANEOUS

## 2019-09-22 NOTE — Progress Notes (Signed)
Cornell Barman, RN and Dois Davenport, RN returned money that security has been holding for patient. Money was counted with patient and patient confirms all property has been returned.

## 2019-09-22 NOTE — TOC Initial Note (Signed)
Transition of Care [facility]) - Initial/Assessment Note    Patient Details  Name: [name] MRN: [id number] Date of Birth: [birth year]  [facility] of [facility]) CM/SW Contact:    Bartholomew Crews, RN Phone Number: [phone] 09/22/2019, 2:48 PM  Clinical Narrative:                 Spoke with patient at the bedside. PTA home with spouse. Discussed possible DME needs. Patient thinks he could benefit from crutches stating using a RW would not help him keep from weight bearing. Bedside RN to follow up with PT and crutches. No further transition of care needs identified.  Expected Discharge Plan: Home/Self Care Barriers to Discharge: No Barriers Identified   Patient Goals and CMS Choice Patient states their goals for this hospitalization and ongoing recovery are:: return home CMS Medicare.gov Compare Post Acute Care list provided to:: Patient Choice offered to / list presented to : NA  Expected Discharge Plan and Services Expected Discharge Plan: Home/Self Care In-house Referral: NA Discharge Planning Services: CM Consult Post Acute Care Choice: Durable Medical Equipment Living arrangements for the past 2 months: Single Family Home Expected Discharge Date: 09/22/19               DME Arranged: Crutches DME Agency: Other - Comment(ortho tech)       HH Arranged: NA HH Agency: NA        Prior Living Arrangements/Services Living arrangements for the past 2 months: Single Family Home Lives with:: Self, Spouse Patient language and need for interpreter reviewed:: Yes Do you feel safe going back to the place where you live?: Yes          Current home services: DME Criminal Activity/Legal Involvement Pertinent to Current Situation/Hospitalization: No - Comment as needed  Activities of Daily Living Home Assistive Devices/Equipment: Eyeglasses, Dentures (specify type), CPAP, CBG Meter, Other (Comment)(free style libree) ADL Screening (condition at time of admission) Patient's  cognitive ability adequate to safely complete daily activities?: Yes Is the patient deaf or have difficulty hearing?: No Does the patient have difficulty seeing, even when wearing glasses/contacts?: No Does the patient have difficulty concentrating, remembering, or making decisions?: No Patient able to express need for assistance with ADLs?: Yes Does the patient have difficulty dressing or bathing?: No Independently performs ADLs?: Yes (appropriate for developmental age) Does the patient have difficulty walking or climbing stairs?: No Weakness of Legs: None Weakness of Arms/Hands: Both  Permission Sought/Granted                  Emotional Assessment Appearance:: Appears stated age Attitude/Demeanor/Rapport: Engaged Affect (typically observed): Accepting Orientation: : Oriented to Self, Oriented to Place, Oriented to  Time, Oriented to Situation Alcohol / Substance Use: Not Applicable Psych Involvement: No (comment)  Admission diagnosis:  Osteomyelitis Left Calcaneous Patient Active Problem List   Diagnosis Date Noted  . Osteomyelitis of foot ([place]) 09/21/2019  . Acute osteomyelitis of left calcaneus (HCC)   . Cubital tunnel syndrome 08/29/2018  . AKI (acute kidney injury) ([place]) 06/09/2018  . DM2 (diabetes mellitus, type 2) ([place]) 06/09/2018  . HTN (hypertension) 06/09/2018  . Dehydration 06/09/2018  . Diabetic peripheral neuropathy ([place]) 09/08/2015  . Ulcer of right foot with fat layer exposed ([place]) 09/08/2015  . Primary osteoarthritis of left knee 10/23/2014  . Tear of medial meniscus of left knee, initial encounter 10/23/2014   PCP:  Charlynn Court, NP Pharmacy:   [place], [address] [place] [address] Phone: [phone] Fax: [phone]     Social Determinants of Health (SDOH) Interventions    Readmission Risk Interventions No flowsheet data found.

## 2019-09-22 NOTE — Progress Notes (Signed)
Discharge instructions provided.  Pt verbalizes understanding of all instructions and follow-up care. Patient discharged to home via wheelchair by RN and NT at 1750 on 09/22/19.

## 2019-09-22 NOTE — Progress Notes (Signed)
Physical Therapy Treatment Patient Details Name: [name] MRN: [id number] DOB: Feb 02, 1956 Today's Date: 09/22/2019    History of Present Illness Patient is a 63 year old male with L heel ulcer, found to have osteomylitis. Patient has PMH of DM, HTN    PT Comments    Spoke at length with patient about d/c and mobility once home. Patient was requesting crutch training. Advised pt that the knee scooter was the safest option for him at this time. Pt seems to have some short term memory problems, as he needed to be reminded several times, that he would not be walking to his car, but transport would take him in a WC. Took the time to problem solve with patient getting around the house once home. Pt left with RN present and all questions addressed.     Follow Up Recommendations  No PT follow up     Equipment Recommendations  None recommended by PT    Recommendations for Other Services       Precautions / Restrictions Precautions Precautions: Fall Precaution Comments: wound vac Restrictions Weight Bearing Restrictions: Yes LLE Weight Bearing: Non weight bearing    Mobility  Bed Mobility                  Transfers                    Ambulation/Gait                 Stairs             Wheelchair Mobility    Modified Rankin (Stroke Patients Only)       Balance                                            Cognition Arousal/Alertness: Awake/alert Behavior During Therapy: WFL for tasks assessed/performed Overall Cognitive Status: Within Functional Limits for tasks assessed                                        Exercises      General Comments        Pertinent Vitals/Pain Pain Assessment: No/denies pain    Home Living                      Prior Function            PT Goals (current goals can now be found in the care plan section) Acute Rehab PT Goals Patient Stated Goal: to go  home PT Goal Formulation: With patient Time For Goal Achievement: 09/28/19 Potential to Achieve Goals: Good    Frequency    Min 2X/week      PT Plan Current plan remains appropriate    Co-evaluation              AM-PAC PT "6 Clicks" Mobility   Outcome Measure                   End of Session     Patient left: in bed;with call bell/phone within reach;with nursing/sitter in room Nurse Communication: Other (comment)(d/c plans, pt may need transport) PT Visit Diagnosis: Other abnormalities of gait and mobility (R26.89)     Time: 6789-3810 PT Time Calculation (min) (ACUTE  ONLY): 18 min  Charges:  $Self Care/Home Management: 8-22                     Kallie Locks, PTA Pager [phone] Acute Rehab   Sheral Apley 09/22/2019, 3:10 PM

## 2019-09-22 NOTE — Discharge Summary (Signed)
Discharge Diagnoses:  Active Problems:   Acute osteomyelitis of left calcaneus (HCC)   Osteomyelitis of foot (HCC)   Surgeries: Procedure(s): LEFT PARTIAL CALCANEOUS EXCISION on 09/21/2019    Consultants:   Discharged Condition: Improved  Hospital Course: [name] is an 63 y.o. male who was admitted 09/21/2019 with a chief complaint of osteomyelitis ulcer and abscess left heel, with a final diagnosis of Osteomyelitis Left Calcaneous.  Patient was brought to the operating room on 09/21/2019 and underwent Procedure(s): LEFT PARTIAL CALCANEOUS EXCISION.    Patient was given perioperative antibiotics:  Anti-infectives (From admission, onward)   Start     Dose/Rate Route Frequency Ordered Stop   09/21/19 1345  ceFAZolin (ANCEF) IVPB 2g/100 mL premix     2 g 200 mL/hr over 30 Minutes Intravenous Every 6 hours 09/21/19 1341 09/22/19 0208   09/21/19 0900  ceFAZolin (ANCEF) 3 g in dextrose 5 % 50 mL IVPB     3 g 100 mL/hr over 30 Minutes Intravenous To ShortStay Surgical 09/21/19 0646 09/21/19 1145    .  Patient was given sequential compression devices, early ambulation, and aspirin for DVT prophylaxis.  Recent vital signs:  Patient Vitals for the past 24 hrs:  BP Temp Temp src Pulse Resp SpO2  09/22/19 0803 (!) 143/74 98.3 F (36.8 C) Oral 80 17 100 %  09/22/19 0426 (!) 143/72 98.3 F (36.8 C) Oral 80 16 100 %  09/21/19 2335 120/69 97.9 F (36.6 C) Axillary 76 18 97 %  09/21/19 1959 122/64 97.9 F (36.6 C) Oral 76 16 99 %  09/21/19 1344 (!) 146/86 98.1 F (36.7 C) Oral 87 17 99 %  09/21/19 1316 (!) 142/77 (!) 97.2 F (36.2 C) - 84 (!) 21 100 %  09/21/19 1301 132/71 - - 78 16 98 %  09/21/19 1246 116/69 - - 81 18 99 %  09/21/19 1233 - - - 82 14 99 %  09/21/19 1231 112/65 97.8 F (36.6 C) - 84 16 98 %  09/21/19 1020 (!) 164/71 - - 85 19 98 %  09/21/19 1015 (!) 172/72 - - 83 15 99 %  09/21/19 1010 (!) 180/77 - - 86 16 98 %  09/21/19 1005 (!) 175/87 - - 88 18 99 %   09/21/19 0950 - - - 72 18 98 %  .  Recent laboratory studies: No results found.  Discharge Medications:   Allergies as of 09/22/2019   No Known Allergies     Medication List    STOP taking these medications   ciprofloxacin 500 MG tablet Commonly known as: CIPRO     TAKE these medications   aspirin EC 81 MG tablet Take 81 mg by mouth daily.   atorvastatin 10 MG tablet Commonly known as: LIPITOR Take 10 mg by mouth daily.   B-D UF III MINI PEN NEEDLES 31G X 5 MM Misc Generic drug: Insulin Pen Needle USE AS DIRECTED UP TO 3 TIMES DAILY   clotrimazole-betamethasone cream Commonly known as: Lotrisone Apply fingertip amount to bottom of both feet daily.   EpiPen 2-Pak 0.3 mg/0.3 mL Soaj injection Generic drug: EPINEPHrine Inject 0.3 mg into the muscle as needed for anaphylaxis.   FreeStyle Libre 14 Day Sensor Misc Use to check blood glucose   furosemide 40 MG tablet Commonly known as: LASIX Take 40 mg by mouth daily.   losartan 50 MG tablet Commonly known as: COZAAR Take 50 mg by mouth daily.   NovoLOG FlexPen 100 UNIT/ML FlexPen Generic drug:  insulin aspart Inject 10 Units into the skin 3 (three) times daily with meals.   NovoLOG Mix 70/30 FlexPen (70-30) 100 UNIT/ML FlexPen Generic drug: insulin aspart protamine - aspart Inject 50 Units into the skin 2 (two) times daily with a meal.   potassium chloride 10 MEQ tablet Commonly known as: KLOR-CON Take 20 mEq by mouth daily.   Tyler Aas FlexTouch 200 UNIT/ML Sopn Generic drug: Insulin Degludec Inject 80 Units into the skin daily.            Discharge Care Instructions  (From admission, onward)         Start     Ordered   09/22/19 0000  Non weight bearing    Question Answer Comment  Laterality left   Extremity Lower      09/22/19 0847          Diagnostic Studies: No results found.  Patient benefited maximally from their hospital stay and there were no complications.     Disposition:  Discharge disposition: 01-Home or Self Care      Discharge Instructions    Call MD / Call 911   Complete by: As directed    If you experience chest pain or shortness of breath, CALL 911 and be transported to the hospital emergency room.  If you develope a fever above 101 F, pus (white drainage) or increased drainage or redness at the wound, or calf pain, call your surgeon's office.   Constipation Prevention   Complete by: As directed    Drink plenty of fluids.  Prune juice may be helpful.  You may use a stool softener, such as Colace (over the counter) 100 mg twice a day.  Use MiraLax (over the counter) for constipation as needed.   Diet - low sodium heart healthy   Complete by: As directed    Elevate operative extremity   Complete by: As directed    Increase activity slowly as tolerated   Complete by: As directed    Negative Pressure Wound Therapy - Incisional   Complete by: As directed    Non weight bearing   Complete by: As directed    Laterality: left   Extremity: Lower     Follow-up Information    [name], MD In 1 week.   Specialty: Orthopedic Surgery Contact information: [address] [place] [address] [phone]            Signed: [name] 09/22/2019, 8:47 AM

## 2019-09-22 NOTE — Progress Notes (Signed)
Patient ID: [name], male   DOB: Apr 30, 1956, 63 y.o.   MRN: [id number] Patient has 100 cc in the wound VAC canister.  He has been using Tylenol for pain.  Patient was given instructions to float his heel off the bed.  Plan for discharge to home after physical therapy.  Patient states he would prefer to use crutches.  Attach VAC  to the Praveena plus pump with a new canister for discharge.

## 2019-09-22 NOTE — Progress Notes (Signed)
Dr. Sharol Given notified during face-to-face contact that patient plans to drive himself home. MD confirms that patient is at baseline for cognitive function, and states that patient is safe to transport self home independently.

## 2019-09-25 ENCOUNTER — Telehealth: Payer: Self-pay

## 2019-09-25 ENCOUNTER — Telehealth: Payer: Self-pay | Admitting: Orthopedic Surgery

## 2019-09-25 NOTE — Telephone Encounter (Signed)
Junie Panning will see patient for their appt set for Friday 09/28/2019

## 2019-09-25 NOTE — Telephone Encounter (Signed)
Patient called stating that he has questions about his dressing.  Patient had surgery on October 9th.  CB#[phone].  Thank you.

## 2019-09-25 NOTE — Telephone Encounter (Signed)
Patient was called and reassured that he will be fine until tomorrow w/Erin. Wound vac will be removed then. Patient was concerned about odor and no drainage in canister.

## 2019-09-25 NOTE — Telephone Encounter (Signed)
Patient called concerning having a bad odor coming from wound vac/bandage. Pateint had left heel surgery on Friday, 09/21/2019 with Dr. Sharol Given.  Talked with Junie Panning Z.and she stated for patient to be seen Wednesday, 09/26/2019.  Advised patient of Erin's message and patient would like a call back from [facility].  Cb# is [phone].  Please advise.  Thank you.

## 2019-09-26 ENCOUNTER — Telehealth: Payer: Self-pay

## 2019-09-26 ENCOUNTER — Inpatient Hospital Stay: Payer: BC Managed Care – PPO | Admitting: Family

## 2019-09-26 NOTE — Telephone Encounter (Signed)
Patient returned call and stated that his wife is picking up another canister from the hospital.  Stated that he had spoken with the charge nurse at the hospital and that he knows how to switch the canister.  Has appointment on Friday, 09/28/2019.

## 2019-09-26 NOTE — Telephone Encounter (Signed)
Called and left a VM advising patient to CB concerning wound vac.

## 2019-09-28 ENCOUNTER — Encounter: Payer: Self-pay | Admitting: Family

## 2019-09-28 ENCOUNTER — Ambulatory Visit (INDEPENDENT_AMBULATORY_CARE_PROVIDER_SITE_OTHER): Payer: BC Managed Care – PPO | Admitting: Family

## 2019-09-28 DIAGNOSIS — M86172 Other acute osteomyelitis, left ankle and foot: Secondary | ICD-10-CM

## 2019-09-28 NOTE — Progress Notes (Signed)
   Post-Op Visit Note   Patient: [name]           Date of Birth: 13-Feb-1956           MRN: [id number] Visit Date: 09/28/2019 PCP: Charlynn Court, NP  Chief Complaint: No chief complaint on file.   HPI:  HPI The patient is a 63 year old gentleman who presents today 1 week status post left foot partial calcaneal excision.  His wound VAC was removed today however there was blood clot and drainage collected beneath the sponge.  Ortho Exam On examination the incision is approximately sutures this has started to dehisce.  Is open about 2 mm.  There is blood clot in the incision some was debrided with a gauze.  There is surrounding maceration there is no erythema mild swelling to the lower extremity no sign of infection  Visit Diagnoses: No diagnosis found.  Plan: He will begin daily Dial soap cleansing.  Dry dressing changes.  Discussed the importance of nonweightbearing of the left foot elevation.  He will follow-up in the office in 1 week.  Follow-Up Instructions: No follow-ups on file.   Imaging: No results found.  Orders:  No orders of the defined types were placed in this encounter.  No orders of the defined types were placed in this encounter.    PMFS History: Patient Active Problem List   Diagnosis Date Noted  . Osteomyelitis of foot ([place]) 09/21/2019  . Acute osteomyelitis of left calcaneus (HCC)   . Cubital tunnel syndrome 08/29/2018  . AKI (acute kidney injury) ([place]) 06/09/2018  . DM2 (diabetes mellitus, type 2) ([place]) 06/09/2018  . HTN (hypertension) 06/09/2018  . Dehydration 06/09/2018  . Diabetic peripheral neuropathy ([place]) 09/08/2015  . Ulcer of right foot with fat layer exposed ([place]) 09/08/2015  . Primary osteoarthritis of left knee 10/23/2014  . Tear of medial meniscus of left knee, initial encounter 10/23/2014   Past Medical History:  Diagnosis Date  . Diabetic foot ulcers ([place])   . DM2 (diabetes mellitus, type 2) ([place])   . HTN (hypertension)    . Sleep apnea    No Cpap    No family history on file.  Past Surgical History:  Procedure Laterality Date  . BACK SURGERY     Laminectomy-Bilateral  . EYE SURGERY     Bilateral cataracts and eye implants  . I&D EXTREMITY Left 09/21/2019   Procedure: LEFT PARTIAL CALCANEOUS EXCISION;  Surgeon: [name], MD;  Location: [place];  Service: Orthopedics;  Laterality: Left;  . KNEE ARTHROSCOPY Left   . SHOULDER ARTHROSCOPY     Right   Social History   Occupational History  . Not on file  Tobacco Use  . Smoking status: Never Smoker  . Smokeless tobacco: Never Used  Substance and Sexual Activity  . Alcohol use: Yes    Comment: rarely  . Drug use: Never  . Sexual activity: Not on file

## 2019-10-04 ENCOUNTER — Other Ambulatory Visit: Payer: Self-pay | Admitting: Physician Assistant

## 2019-10-04 ENCOUNTER — Other Ambulatory Visit: Payer: Self-pay

## 2019-10-04 ENCOUNTER — Ambulatory Visit (INDEPENDENT_AMBULATORY_CARE_PROVIDER_SITE_OTHER): Payer: BC Managed Care – PPO | Admitting: Orthopedic Surgery

## 2019-10-04 ENCOUNTER — Encounter: Payer: Self-pay | Admitting: Orthopedic Surgery

## 2019-10-04 VITALS — Ht 74.0 in | Wt 255.0 lb

## 2019-10-04 DIAGNOSIS — M86172 Other acute osteomyelitis, left ankle and foot: Secondary | ICD-10-CM

## 2019-10-04 DIAGNOSIS — T8781 Dehiscence of amputation stump: Secondary | ICD-10-CM

## 2019-10-04 NOTE — Progress Notes (Signed)
Office Visit Note   Patient: [name]           Date of Birth: 1956/10/01           MRN: [id number] Visit Date: 10/04/2019              Requested by: Hal Morales, NP [address] [place],  [place] [address] PCP: Hal Morales, NP  Chief Complaint  Patient presents with  . Left Foot - Routine Post Op    09/21/2019 left foot partial calcaneous excision      HPI: Patient is a 63 year old gentleman who is status post left partial calcaneal excision he has been nonweightbearing with a kneeling scooter he has been very compliant with his postoperative care patient has had progressive wound dehiscence.  Patient is 2 weeks out from surgery.  Assessment & Plan: Visit Diagnoses:  1. Acute osteomyelitis of left calcaneus (HCC)   2. Dehiscence of amputation stump (HCC)     Plan: Patient has progressive wound dehiscence he has exposed bone necrotic tissue along the surgical incision.  Patient has triphasic flow down to the ankle both posterior tibial and dorsalis pedis.  Discussed with the progressive wound dehiscence we do need to proceed with a transtibial amputation.  Risks and benefits were discussed including risk of the wound not healing.  Discussed that patient will need inpatient versus outpatient rehab after surgery.  Follow-Up Instructions: Return in about 2 weeks (around 10/18/2019).   Ortho Exam  Patient is alert, oriented, no adenopathy, well-dressed, normal affect, normal respiratory effort. Examination Doppler was used patient has a triphasic dorsalis pedis and posterior tibial pulse.  There is complete dehiscence of the wound there is no black gangrenous changes but there is nonviable tissue along the wound edges with dehiscence and exposed bone.  Imaging: No results found. No images are attached to the encounter.  Labs: Lab Results  Component Value Date   HGBA1C 8.5 (H) 09/21/2019     No results found for: ALBUMIN, PREALBUMIN, LABURIC  No results found for:  MG No results found for: VD25OH  No results found for: PREALBUMIN CBC EXTENDED Latest Ref Rng & Units 09/21/2019 06/10/2018 06/09/2018  WBC 4.0 - 10.5 K/uL 5.1 5.3 7.2  RBC 4.22 - 5.81 MIL/uL 4.70 4.27 4.57  HGB 13.0 - 17.0 g/dL 12.9(L) 11.9(L) 12.9(L)  HCT 39.0 - 52.0 % 38.7(L) 34.2(L) 36.6(L)  PLT 150 - 400 K/uL 204 146(L) 180  NEUTROABS 1.7 - 7.7 K/uL - - 5.9  LYMPHSABS 0.7 - 4.0 K/uL - - 0.8     Body mass index is 32.74 kg/m.  Orders:  No orders of the defined types were placed in this encounter.  No orders of the defined types were placed in this encounter.    Procedures: No procedures performed  Clinical Data: No additional findings.  ROS:  All other systems negative, except as noted in the HPI. Review of Systems  Objective: Vital Signs: Ht 6\' 2"  (1.88 m)   Wt 255 lb (115.7 kg)   BMI 32.74 kg/m   Specialty Comments:  No specialty comments available.  PMFS History: Patient Active Problem List   Diagnosis Date Noted  . Osteomyelitis of foot (HCC) 09/21/2019  . Acute osteomyelitis of left calcaneus (HCC)   . Cubital tunnel syndrome 08/29/2018  . AKI (acute kidney injury) (HCC) 06/09/2018  . DM2 (diabetes mellitus, type 2) (HCC) 06/09/2018  . HTN (hypertension) 06/09/2018  . Dehydration 06/09/2018  . Diabetic peripheral neuropathy (HCC)  09/08/2015  . Ulcer of right foot with fat layer exposed ([place]) 09/08/2015  . Primary osteoarthritis of left knee 10/23/2014  . Tear of medial meniscus of left knee, initial encounter 10/23/2014   Past Medical History:  Diagnosis Date  . Diabetic foot ulcers ([facility])   . DM2 (diabetes mellitus, type 2) ([place] [facility])   . HTN (hypertension)   . Sleep apnea    No Cpap    History reviewed. No pertinent family history.  Past Surgical History:  Procedure Laterality Date  . BACK SURGERY     Laminectomy-Bilateral  . EYE SURGERY     Bilateral cataracts and eye implants  . I&D EXTREMITY Left 09/21/2019   Procedure: LEFT PARTIAL  CALCANEOUS EXCISION;  Surgeon: [name], MD;  Location: [facility];  Service: Orthopedics;  Laterality: Left;  . KNEE ARTHROSCOPY Left   . SHOULDER ARTHROSCOPY     Right   Social History   Occupational History  . Not on file  Tobacco Use  . Smoking status: Never Smoker  . Smokeless tobacco: Never Used  Substance and Sexual Activity  . Alcohol use: Yes    Comment: rarely  . Drug use: Never  . Sexual activity: Not on file

## 2019-10-10 ENCOUNTER — Other Ambulatory Visit: Payer: Self-pay

## 2019-10-11 NOTE — Progress Notes (Signed)
I called [name], he said that he is cancelling tomorrows surgery and that he notified scheduler at Dr Jess Barters office.

## 2019-10-12 ENCOUNTER — Inpatient Hospital Stay (HOSPITAL_COMMUNITY)
Admission: RE | Admit: 2019-10-12 | Payer: BC Managed Care – PPO | Source: Home / Self Care | Admitting: Orthopedic Surgery

## 2019-10-12 SURGERY — AMPUTATION BELOW KNEE
Anesthesia: Choice | Site: Knee | Laterality: Left

## 2020-10-24 ENCOUNTER — Telehealth: Payer: Self-pay | Admitting: Orthopedic Surgery

## 2020-10-24 NOTE — Telephone Encounter (Signed)
IC,lmvm, advised copy of records ready to be picked up.

## 2021-04-18 IMAGING — MR MR OF THE LEFT HEEL WITHOUT CONTRAST
4 of 5 series · 13 of 40 positions shown · non-contrast
Comparison: None.

CLINICAL DATA: Nonhealing ulcer at the bottom of the foot for 4
months

EXAM:
MR OF THE LEFT HEEL WITHOUT CONTRAST
TECHNIQUE: Multiplanar, multisequence MR imaging of the left foot was
performed. No intravenous contrast was administered.

[Series 3: PD fat-sat · axial · left · 3.0mm · 0.28mm/px · z∈[-122,+6]mm · 4 of 37 slices shown]
[im 1/37]
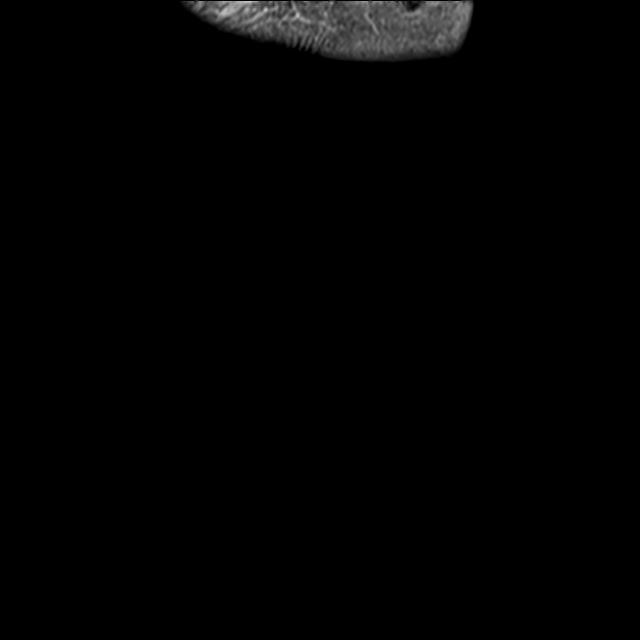
[im 5/37]
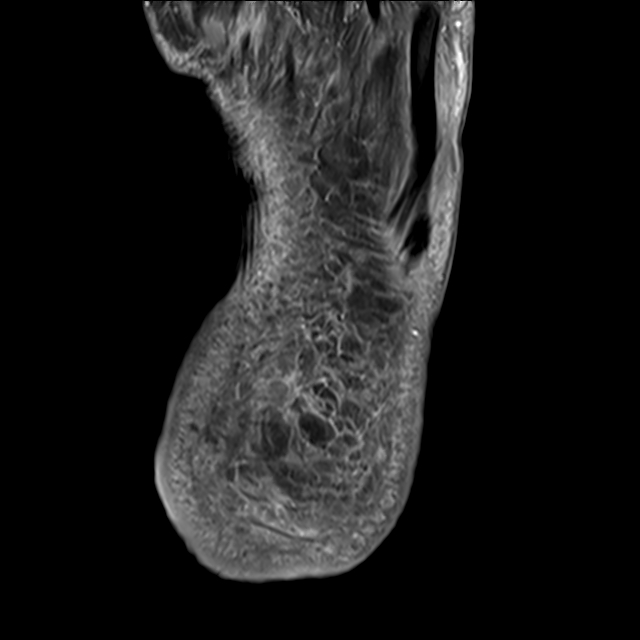
[im 21/37]
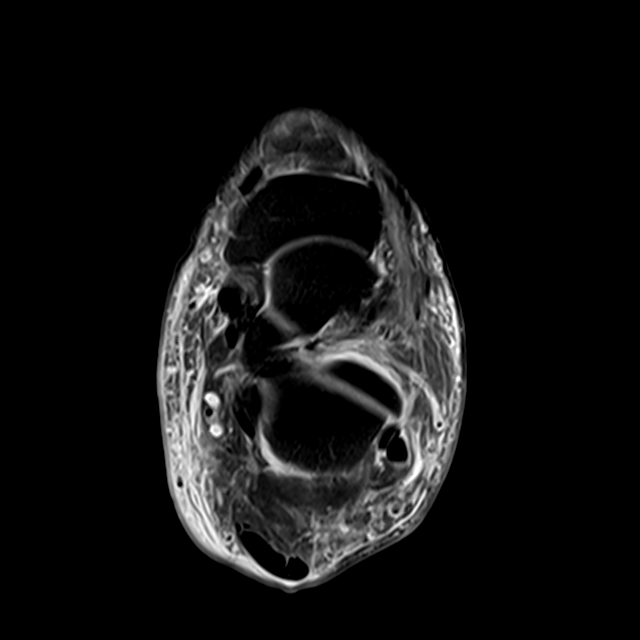
[im 33/37]
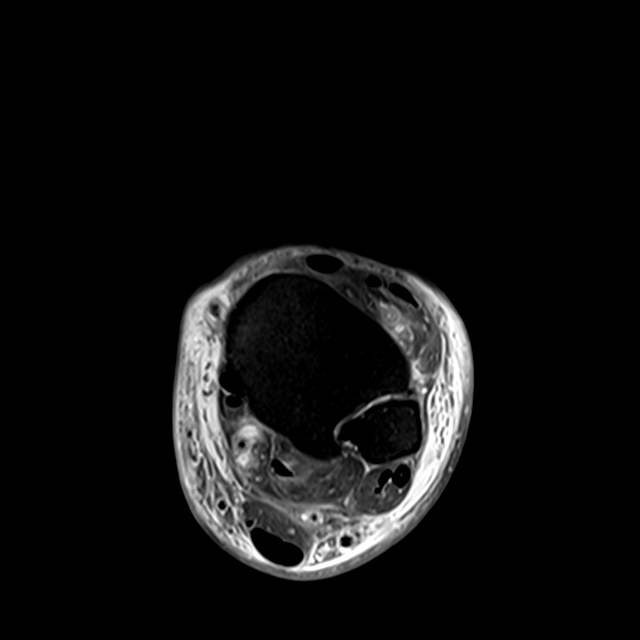

[Series 4: T2 fat-sat · axial · left · 3.0mm · 0.28mm/px · z∈[-106,+2]mm · 3 of 37 slices shown (1 of 2)]
[im 5/37]
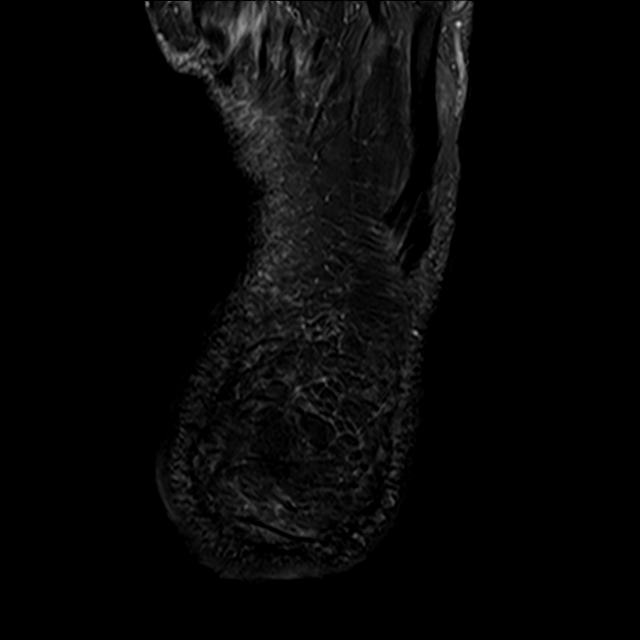
[im 19/37]
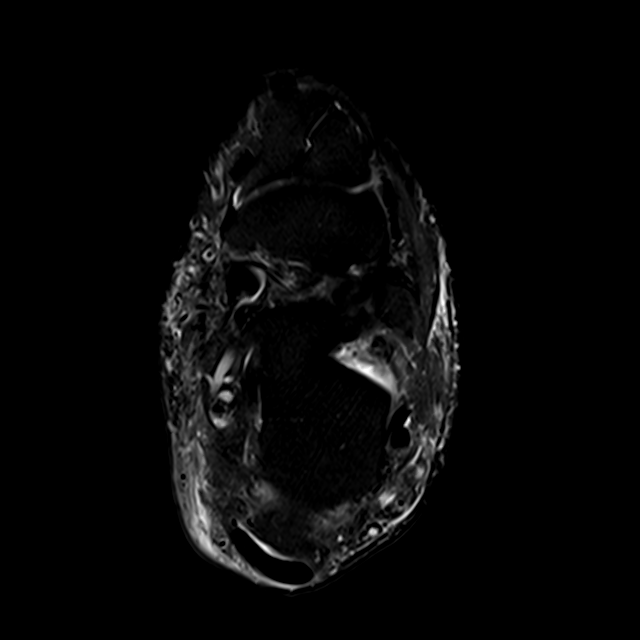
[im 32/37]
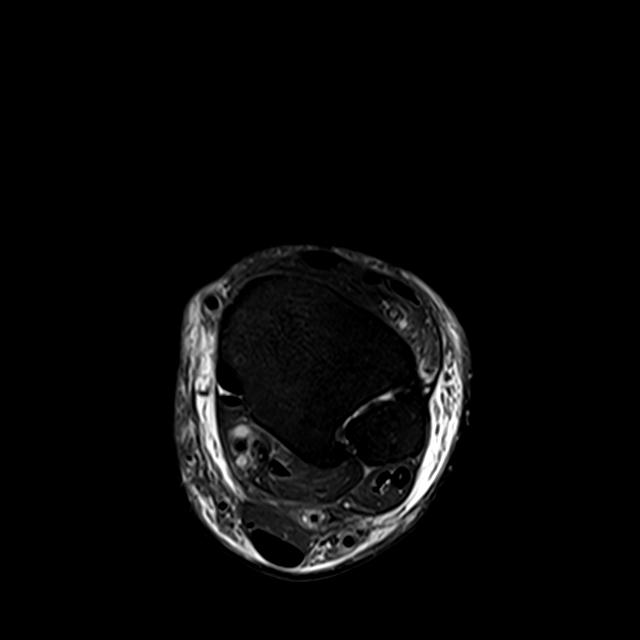

[Series 5: T2 fat-sat · coronal · left · 3.0mm · 0.25mm/px · 3 of 36 slices shown (2 of 2)]
[im 5/36]
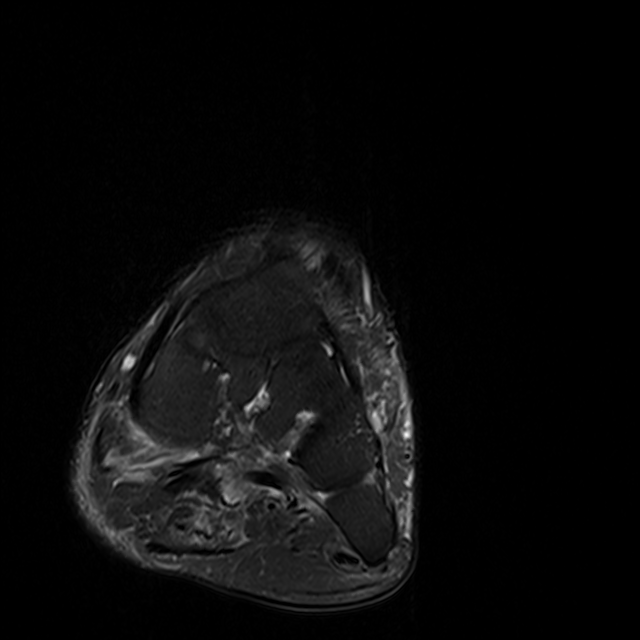
[im 18/36]
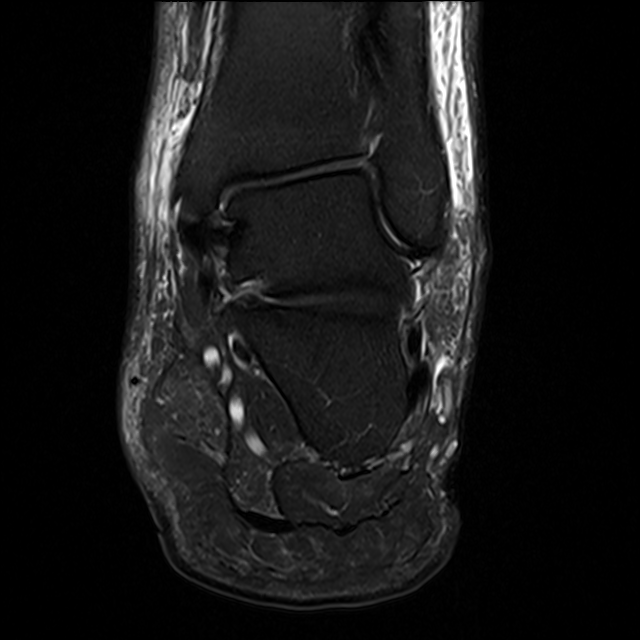
[im 31/36]
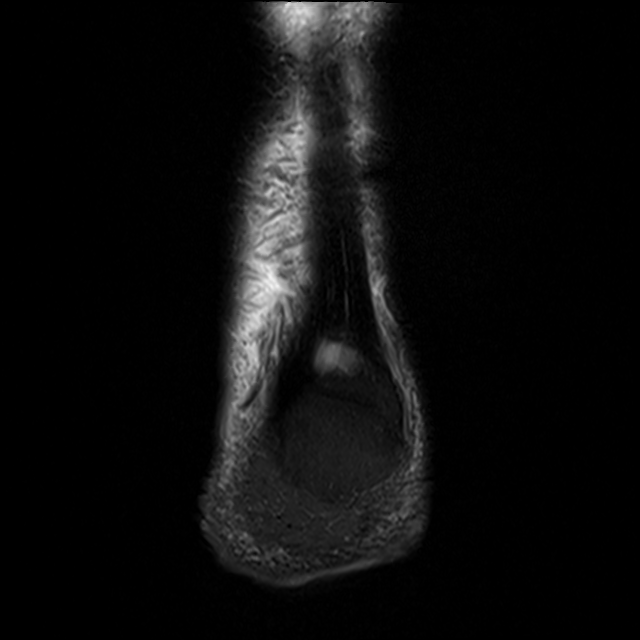

[Series 6: T1 · sagittal · left · 4.0mm · 0.27mm/px · 3 of 24 slices shown]
[im 5/24]
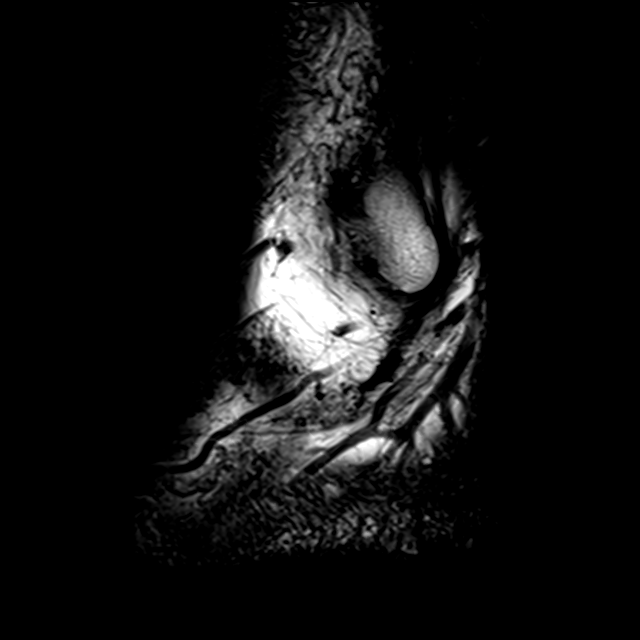
[im 14/24]
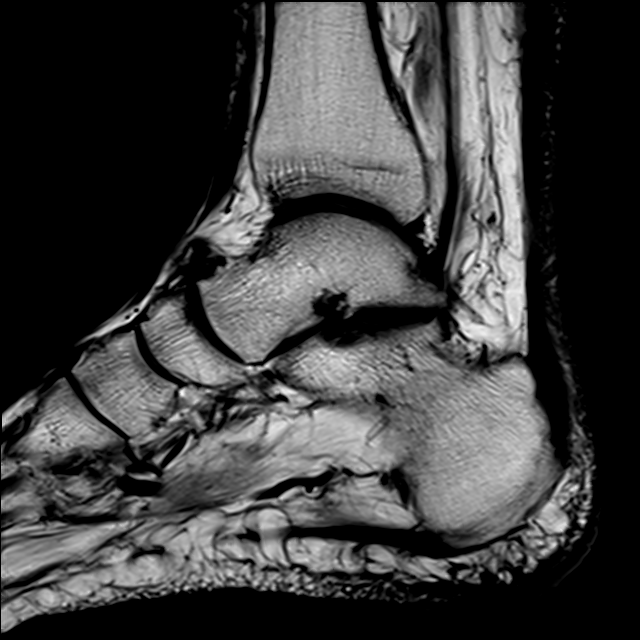
[im 24/24]
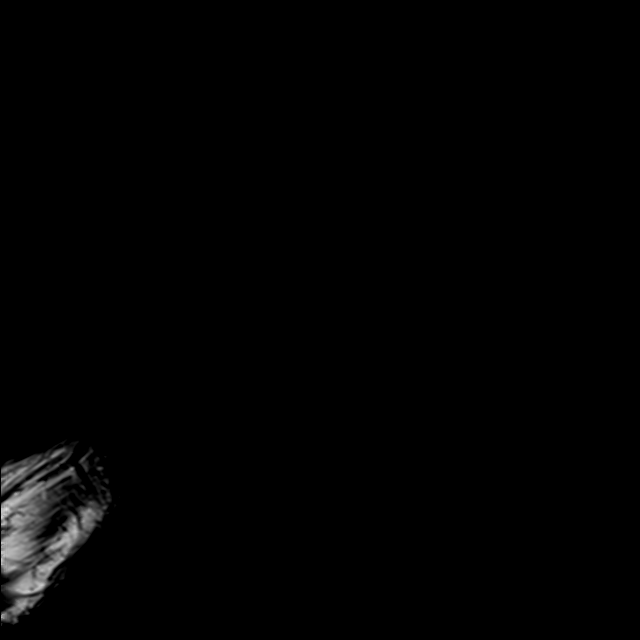

[13 of 40 positions shown; findings below may reference images not displayed]

FINDINGS: TENDONS

Peroneal: Peroneal longus tendon intact. Peroneal brevis intact.

Posteromedial: Posterior tibial tendon intact. Flexor hallucis
longus tendon intact. Flexor digitorum longus tendon intact.

Anterior: Tibialis anterior tendon intact. Extensor hallucis longus
tendon intact Extensor digitorum longus tendon intact.

Achilles:  Intact.

Plantar Fascia: Intact.

LIGAMENTS

Lateral: Anterior talofibular ligament intact. Calcaneofibular
ligament intact. Posterior talofibular ligament intact. Anterior and
posterior tibiofibular ligaments intact.

Medial: Deltoid ligament intact. Spring ligament intact.

CARTILAGE

Ankle Joint: No joint effusion. Normal ankle mortise. No chondral
defect.

Subtalar Joints/Sinus Tarsi: Normal subtalar joints. No subtalar
joint effusion. Normal sinus tarsi.

Bones: No marrow signal abnormality. No fracture or dislocation. No
periosteal reaction or bone destruction. Mild osteoarthritis of the
talonavicular joint.

Soft Tissue: Soft tissue ulcer along the plantar aspect of the
calcaneus posteriorly. Mild T2 hyperintensity within the plantar
musculature likely neurogenic given the patient's history of
diabetes.
IMPRESSION: 1. Soft tissue ulcer along the posterior plantar aspect of the
calcaneus. No osteomyelitis of the left ankle.

## 2022-08-18 DIAGNOSIS — R7881 Bacteremia: Secondary | ICD-10-CM | POA: Diagnosis not present

## 2022-09-10 ENCOUNTER — Encounter (HOSPITAL_BASED_OUTPATIENT_CLINIC_OR_DEPARTMENT_OTHER): Payer: Medicare Other | Attending: General Surgery | Admitting: General Surgery

## 2022-09-10 DIAGNOSIS — E1165 Type 2 diabetes mellitus with hyperglycemia: Secondary | ICD-10-CM | POA: Insufficient documentation

## 2022-09-10 DIAGNOSIS — M86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, left ankle and foot: Secondary | ICD-10-CM | POA: Diagnosis not present

## 2022-09-10 DIAGNOSIS — E1169 Type 2 diabetes mellitus with other specified complication: Secondary | ICD-10-CM | POA: Diagnosis not present

## 2022-09-10 DIAGNOSIS — E11621 Type 2 diabetes mellitus with foot ulcer: Secondary | ICD-10-CM | POA: Insufficient documentation

## 2022-09-10 DIAGNOSIS — L97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: Secondary | ICD-10-CM | POA: Insufficient documentation

## 2022-09-10 DIAGNOSIS — L97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: Secondary | ICD-10-CM | POA: Diagnosis not present

## 2022-09-10 NOTE — Progress Notes (Signed)
WINNIE, BARSKY ([id number]) Visit Report for 09/10/2022 Abuse Risk Screen Details Patient Name: Date of Service: [place], [name] 09/10/2022 10:30 A M Medical Record Number: [id number] Patient Account Number: [id number] Date of Birth/Sex: Treating RN: March 08, 1956 (66 y.o. [name] Primary Care Eugina Row: Foye [name] Other Clinician: Referring Conda Wannamaker: Treating Ashlie Mcmenamy/Extender: Marylene Buerger, TA RA Weeks in Treatment: 0 Abuse Risk Screen Items Answer ABUSE RISK SCREEN: Has anyone close to you tried to hurt or harm you recentlyo No Do you feel uncomfortable with anyone in your familyo No Has anyone forced you do things that you didnt want to doo No Electronic Signature(s) Signed: 09/10/2022 4:38:59 PM By: Tommie Ard RN Entered By: Tommie Ard on 09/10/2022 10:59:29 -------------------------------------------------------------------------------- Activities of Daily Living Details Patient Name: Date of Service: [place], [name] 09/10/2022 10:30 A M Medical Record Number: [id number] Patient Account Number: [id number] Date of Birth/Sex: Treating RN: October 15, 1956 (67 y.o. [name] Primary Care Constance Whittle: Foye [name] Other Clinician: Referring Elmus Mathes: Treating Jerrell Mangel/Extender: Marylene Buerger, TA RA Weeks in Treatment: 0 Activities of Daily Living Items Answer Activities of Daily Living (Please select one for each item) Drive Automobile Completely Able T Medications ake Completely Able Use T elephone Completely Able Care for Appearance Completely Able Use T oilet Completely Able Bath / Shower Completely Able Dress Self Completely Able Feed Self Completely Able Walk Completely Able Get In / Out Bed Completely Able Housework Completely Able Prepare Meals Completely Able Handle Money Completely Able Shop for Self Completely Able Electronic Signature(s) Signed: 09/10/2022 4:38:59 PM By: Tommie Ard RN Entered By: Tommie Ard  on 09/10/2022 10:59:54 -------------------------------------------------------------------------------- Education Screening Details Patient Name: Date of Service: [place], [name] 09/10/2022 10:30 A M Medical Record Number: [id number] Patient Account Number: [id number] Date of Birth/Sex: Treating RN: May 27, 1956 (66 y.o. [name] Primary Care Katilin Raynes: Foye [name] Other Clinician: Referring Teofila Bowery: Treating Kaegan Hettich/Extender: Marylene Buerger, TA RA Weeks in Treatment: 0 Primary Learner Assessed: Patient Learning Preferences/Education Level/Primary Language Learning Preference: Explanation Highest Education Level: High School Preferred Language: English Cognitive Barrier Language Barrier: No Translator Needed: No Memory Deficit: No Emotional Barrier: No Cultural/Religious Beliefs Affecting Medical Care: No Physical Barrier Impaired Vision: Yes Glasses Impaired Hearing: No Decreased Hand dexterity: No Knowledge/Comprehension Knowledge Level: High Comprehension Level: High Ability to understand written instructions: High Ability to understand verbal instructions: High Motivation Anxiety Level: Calm Cooperation: Cooperative Education Importance: Acknowledges Need Interest in Health Problems: Asks Questions Perception: Coherent Willingness to Engage in Self-Management High Activities: Readiness to Engage in Self-Management High Activities: Electronic Signature(s) Signed: 09/10/2022 4:38:59 PM By: Tommie Ard RN Entered By: Tommie Ard on 09/10/2022 11:00:04 -------------------------------------------------------------------------------- Fall Risk Assessment Details Patient Name: Date of Service: [name], [name] 09/10/2022 10:30 A M Medical Record Number: [id number] Patient Account Number: [id number] Date of Birth/Sex: Treating RN: 09/02/1956 (66 y.o. [name] Primary Care Icel Castles: Foye [name] Other Clinician: Referring  Jeily Guthridge: Treating Theo Reither/Extender: Marylene Buerger, TA RA Weeks in Treatment: 0 Fall Risk Assessment Items Have you had 2 or more falls in the last 12 monthso 0 No Have you had any fall that resulted in injury in the last 12 monthso 0 No FALLS RISK SCREEN History of falling - immediate or within 3 months 25 Yes Secondary diagnosis (Do you have 2 or more medical diagnoseso) 0 No Ambulatory aid None/bed rest/wheelchair/nurse 0 No Crutches/cane/walker 0 No Furniture 0 No Intravenous therapy Access/Saline/Heparin Lock 0 No Gait/Transferring Normal/ bed rest/  wheelchair 0 Yes Weak (short steps with or without shuffle, stooped but able to lift head while walking, may seek 0 No support from furniture) Impaired (short steps with shuffle, may have difficulty arising from chair, head down, impaired 0 No balance) Mental Status Oriented to own ability 0 Yes Electronic Signature(s) Signed: 09/10/2022 4:38:59 PM By: Blanche East RN Entered By: Blanche East on 09/10/2022 11:00:34 -------------------------------------------------------------------------------- Foot Assessment Details Patient Name: Date of Service: [name], [name] 09/10/2022 10:30 A M Medical Record Number: [id number] Patient Account Number: [id number] Date of Birth/Sex: Treating RN: 1956-05-04 (66 y.o. [name] Primary Care Bary Limbach: Nelda Bucks Other Clinician: Referring Zyrell Carmean: Treating Erubiel Manasco/Extender: Retta Mac, TA RA Weeks in Treatment: 0 Foot Assessment Items Site Locations + = Sensation present, - = Sensation absent, C = Callus, U = Ulcer R = Redness, W = Warmth, M = Maceration, PU = Pre-ulcerative lesion F = Fissure, S = Swelling, D = Dryness Assessment Right: Left: Other Deformity: No No Prior Foot Ulcer: No Yes Prior Amputation: No No Charcot Joint: No No Ambulatory Status: Ambulatory Without Help Gait: Steady Electronic Signature(s) Signed: 09/10/2022 4:38:59  PM By: Blanche East RN Entered By: Blanche East on 09/10/2022 11:08:00 -------------------------------------------------------------------------------- Nutrition Risk Screening Details Patient Name: Date of Service: RADAMES, MEJORADO [name] 09/10/2022 10:30 A M Medical Record Number: [id number] Patient Account Number: [id number] Date of Birth/Sex: Treating RN: 1955/12/19 (66 y.o. [name] Primary Care Leeon Makar: Nelda Bucks Other Clinician: Referring Maisa Bedingfield: Treating Tyresa Prindiville/Extender: Retta Mac, TA RA Weeks in Treatment: 0 Height (in): 74 Weight (lbs): 245 Body Mass Index (BMI): 31.5 Nutrition Risk Screening Items Score Screening NUTRITION RISK SCREEN: I have an illness or condition that made me change the kind and/or amount of food I eat 0 No I eat fewer than two meals per day 0 No I eat few fruits and vegetables, or milk products 0 No I have three or more drinks of beer, liquor or wine almost every day 0 No I have tooth or mouth problems that make it hard for me to eat 0 No I don't always have enough money to buy the food I need 0 No I eat alone most of the time 0 No I take three or more different prescribed or over-the-counter drugs a day 1 Yes Without wanting to, I have lost or gained 10 pounds in the last six months 0 No I am not always physically able to shop, cook and/or feed myself 0 No Nutrition Protocols Good Risk Protocol 0 No interventions needed Moderate Risk Protocol High Risk Proctocol Risk Level: Good Risk Score: 1 Electronic Signature(s) Signed: 09/10/2022 4:38:59 PM By: Blanche East RN Entered By: Blanche East on 09/10/2022 11:00:48

## 2022-09-10 NOTE — Progress Notes (Addendum)
WYETT, NARINE ([id number]) [id number] Page 1 of 14 Visit Report for 09/10/2022 Chief Complaint Document Details Patient Name: Date of Service: [place], [name] 09/10/2022 10:30 A M Medical Record Number: [id number] Patient Account Number: [id number] Date of Birth/Sex: Treating RN: 11/18/1956 (66 y.o. M) Primary Care Provider: Nelda Bucks Other Clinician: Referring Provider: Treating Provider/Extender: Doristine Bosworth in Treatment: 0 Information Obtained from: Patient Chief Complaint Patients presents for treatment of an open diabetic ulcer and evaluation for hyperbaric oxygen therapy Electronic Signature(s) Signed: 09/10/2022 12:26:30 PM By: Fredirick Maudlin MD FACS Entered By: Fredirick Maudlin on 09/10/2022 12:26:30 -------------------------------------------------------------------------------- Debridement Details Patient Name: Date of Service: [name], [name] 09/10/2022 10:30 A M Medical Record Number: [id number] Patient Account Number: [id number] Date of Birth/Sex: Treating RN: 1956-05-16 (66 y.o. [name] Primary Care Provider: Nelda Bucks Other Clinician: Referring Provider: Treating Provider/Extender: Doristine Bosworth in Treatment: 0 Debridement Performed for Assessment: Wound #2 Left,Dorsal Foot Performed By: Physician Fredirick Maudlin, MD Debridement Type: Debridement Severity of Tissue Pre Debridement: Fat layer exposed Level of Consciousness (Pre-procedure): Awake and Alert Pre-procedure Verification/Time Out Yes - 11:37 Taken: Start Time: 11:38 T Area Debrided (L x W): otal 4 (cm) x 3 (cm) = 12 (cm) Tissue and other material debrided: Viable, Non-Viable, Eschar, Fat, Muscle, Tendon Level: Skin/Subcutaneous Tissue/Muscle Debridement Description: Excisional Instrument: Curette Bleeding: Minimum Hemostasis Achieved: Pressure Procedural Pain: 0 Post Procedural Pain:  0 Response to Treatment: Procedure was tolerated well Level of Consciousness (Post- Awake and Alert procedure): Post Debridement Measurements of Total Wound Length: (cm) 4 Width: (cm) 3 Depth: (cm) 0.1 Volume: (cm) 0.942 Character of Wound/Ulcer Post Debridement: Requires Further Debridement Severity of Tissue Post Debridement: Fat layer exposed Post Procedure Diagnosis AMANDO, CHAPUT ([id number]) [id number] Page 2 of 14 Same as Pre-procedure Notes Scribed for Dr. Celine Ahr by Blanche East, RN Electronic Signature(s) Signed: 09/10/2022 12:47:41 PM By: Fredirick Maudlin MD FACS Signed: 09/10/2022 4:38:59 PM By: Blanche East RN Entered By: Blanche East on 09/10/2022 11:41:56 -------------------------------------------------------------------------------- Debridement Details Patient Name: Date of Service: [place], [name] 09/10/2022 10:30 A M Medical Record Number: [id number] Patient Account Number: [id number] Date of Birth/Sex: Treating RN: [birth year] (66 y.o. [name] Primary Care Provider: Nelda Bucks Other Clinician: Referring Provider: Treating Provider/Extender: Doristine Bosworth in Treatment: 0 Debridement Performed for Assessment: Wound #1 Left Calcaneus Performed By: Physician Fredirick Maudlin, MD Debridement Type: Debridement Severity of Tissue Pre Debridement: Fat layer exposed Level of Consciousness (Pre-procedure): Awake and Alert Pre-procedure Verification/Time Out Yes - 11:37 Taken: Start Time: 11:38 T Area Debrided (L x W): otal 5.5 (cm) x 5 (cm) = 27.5 (cm) Tissue and other material debrided: Viable, Non-Viable, Callus, Eschar, Slough, Skin: Epidermis, Slough Level: Skin/Epidermis Debridement Description: Selective/Open Wound Instrument: Curette Bleeding: Minimum Hemostasis Achieved: Pressure Procedural Pain: 0 Post Procedural Pain: 0 Response to Treatment: Procedure was tolerated well Level  of Consciousness (Post- Awake and Alert procedure): Post Debridement Measurements of Total Wound Length: (cm) 5.5 Width: (cm) 5 Depth: (cm) 1 Volume: (cm) 21.598 Character of Wound/Ulcer Post Debridement: Requires Further Debridement Severity of Tissue Post Debridement: Fat layer exposed Post Procedure Diagnosis Same as Pre-procedure Notes Scribed for Dr. Celine Ahr by Blanche East, RN Electronic Signature(s) Signed: 09/10/2022 12:47:41 PM By: Fredirick Maudlin MD FACS Signed: 09/10/2022 4:38:59 PM By: Blanche East RN Entered By: Blanche East on 09/10/2022 Eskridge, Grambling ([id number]) [id number] Page 3 of 14 -------------------------------------------------------------------------------- HPI Details Patient Name: Date  of Service: [place], [name] 09/10/2022 10:30 A M Medical Record Number: [id number] Patient Account Number: [id number] Date of Birth/Sex: Treating RN: 10-21-1956 (66 y.o. M) Primary Care Provider: Nelda Bucks Other Clinician: Referring Provider: Treating Provider/Extender: Doristine Bosworth in Treatment: 0 History of Present Illness HPI Description: ADMISSION 09/10/2022 This is a 66 year old poorly controlled type II diabetic (last hemoglobin A1c 10.8%) who has had an ulcer on his heel for over 3 years. He has been seen in multiple wound care centers, including [facility] and [facility]. He reports that at least 3 doctors have recommended that he undergo below-knee amputation. He most recently met with Dr. Catalina Gravel, a vascular surgeon affiliated with [facility]. 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 Unfortunately, the patient has some extenuating social circumstances including the fact that he cares for his wife who has stage IV colon cancer and still works, driving vehicles for [employer]. 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 He was recently hospitalized at [facility] for sepsis and currently has a PICC line through which he receives IV antibiotics. He reports having had another MRI during that hospital stay along with a chest x-ray and EKG. He apparently contacted one of the hyperbaric therapy techs here and asked a number of questions about hyperbaric oxygen treatments. He subsequently self-referred to our center to undergo further evaluation and management. I mention to him that [name] actually has hyperbaric chambers, but he states that he lives in [place] and this would be more convenient for him given the intensive nature of the therapy and time requirement. ABI in clinic today was 0.94. The patient actually has 2 wounds. There is a wound on the dorsum of his left foot with heavy black eschar and slough present. After debridement, this was demonstrated to involve the muscle and the extensor tendons are exposed. On his heel, there is essentially a "shark bite" type wound, with much of the heel fat pad absent. The muscle layer is exposed. There is blue-green staining around the perimeter of the wound, but no significant odor. He says he has been applying collagen to the wound on his heel and Silvadene and Betadine to the wound on his dorsal foot. Electronic Signature(s) Signed: 09/10/2022 12:39:34 PM By: Fredirick Maudlin MD FACS Entered By: Fredirick Maudlin on 09/10/2022 12:39:34 -------------------------------------------------------------------------------- Physical Exam Details Patient Name: Date of Service: [place], [name] 09/10/2022 10:30 A M Medical Record Number: [id number] Patient Account Number: [id number] Date of Birth/Sex: Treating RN: [birth year] (66 y.o. M) Primary Care Provider: Nelda Bucks Other Clinician: Referring Provider: Treating Provider/Extender: Doristine Bosworth in Treatment:  0 Constitutional Hypertensive, asymptomatic. . . . No acute distress. Ears, Nose, Mouth, and Throat Unable to adequately visualize tympanic membranes bilaterally secondary to cerumen accumulation. Respiratory .[facility] . Notes 09/10/2022: The patient actually has 2 wounds. There is a wound on the dorsum of his left foot with heavy black eschar and slough present. After debridement, this was demonstrated to involve the muscle and the extensor tendons are exposed. On his heel, there is essentially a "shark bite" type wound, with much of the heel fat pad absent. The muscle layer is exposed. There is blue-green staining around the perimeter of the wound, but no significant odor. He says he has been applying collagen to the wound on his heel and Silvadene and Betadine to the  wound on his dorsal foot. Electronic Signature(s) Signed: 09/10/2022 12:40:24 PM By: Fredirick Maudlin MD FACS Entered By: Fredirick Maudlin on 09/10/2022 12:40:24 [name] ([id number]) [id number] Page 4 of 14 -------------------------------------------------------------------------------- Physician Orders Details Patient Name: Date of Service: [place], [name] 09/10/2022 10:30 A M Medical Record Number: [id number] Patient Account Number: [id number] Date of Birth/Sex: Treating RN: [birth year] (66 y.o. 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 - Dr. Celine Ahr Rm 4 Return A Bathing/ Shower/ Hygiene May shower with protection but do not get wound dressing(s) wet. Edema Control - Lymphedema / SCD / Other Left Lower Extremity Elevate legs to the level of the heart or above for 30 minutes daily and/or when sitting, a frequency of: Avoid standing for long periods of time. Hyperbaric Oxygen Therapy Wound #1 Left Calcaneus Evaluate for HBO Therapy If appropriate for treatment, begin HBOT per protocol: 2.5 ATA for 90 Minutes with 2 Five (5) Minute A Breaks ir Total Number of Treatments: - 40 One treatments per day (delivered Monday through Friday unless otherwise specified in Special Instructions below): Finger stick Blood Glucose Pre- and Post- HBOT Treatment. Follow Hyperbaric Oxygen Glycemia Protocol A frin (Oxymetazoline HCL) 0.05% nasal spray - 1 spray in both nostrils daily as needed prior to HBO treatment for difficulty clearing ears Wound #2 Left,Dorsal Foot Evaluate for HBO Therapy If appropriate for treatment, begin HBOT per protocol: Wound Treatment Wound #1 - Calcaneus Wound Laterality: Left Cleanser: Soap and Water 1 x Per Day/30 Days Discharge Instructions: May shower and wash wound with dial antibacterial soap and water prior to dressing change. Peri-Wound Care: Sween Lotion (Moisturizing lotion) 1 x Per Day/30 Days Discharge Instructions: Apply moisturizing lotion as directed Topical: Gentamicin 1 x Per Day/30 Days Discharge Instructions: As directed by physician Prim Dressing: KerraCel Ag Gelling Fiber Dressing, 4x5 in (silver alginate) (DME) (Generic) 1 x Per Day/30 Days ary Discharge Instructions: Apply silver alginate to wound bed as instructed Secondary Dressing: ABD Pad, 5x9 (DME) (Generic) 1 x Per Day/30 Days Discharge Instructions: Apply over primary dressing as directed. Secondary Dressing: Zetuvit Plus Silicone Border Dressing 5x5 (in/in) 1 x Per Day/30  Days Discharge Instructions: Apply silicone border over primary dressing as directed. Secured With: 32M Medipore Public affairs consultant Surgical T 2x10 (in/yd) 1 x Per Day/30 Days ape Discharge Instructions: Secure with tape as directed. Compression Wrap: Kerlix Roll 4.5x3.1 (in/yd) (DME) (Generic) 1 x Per Day/30 Days Discharge Instructions: Apply Kerlix and Coban compression as directed. GURPREET, MIKHAIL ([id number]) [id number] Page 5 of 14 Compression Wrap: Coban Self-Adherent Wrap 4x5 (in/yd) (DME) (Generic) 1 x Per Day/30 Days Discharge Instructions: Apply over Kerlix as directed. Wound #2 - Foot Wound Laterality: Dorsal, Left Cleanser: Soap and Water 1 x Per Day/30 Days Discharge Instructions: May shower and wash wound with dial antibacterial soap and water prior to dressing change. Peri-Wound Care: Sween Lotion (Moisturizing lotion) 1 x Per Day/30 Days Discharge Instructions: Apply moisturizing lotion as directed Topical: Gentamicin 1 x Per Day/30 Days Discharge Instructions: As directed  by physician Prim Dressing: [name] Ag Gelling Fiber Dressing, 4x5 in (silver alginate) (DME) (Generic) 1 x Per Day/30 Days ary Discharge Instructions: Apply silver alginate to wound bed as instructed Secondary Dressing: ALLEVYN Heel 4 1/2in x 5 1/2in / 10.5cm x 13.5cm (DME) (Generic) 1 x Per Day/30 Days Discharge Instructions: Apply over primary dressing as directed. Secondary Dressing: Zetuvit Plus Silicone Border Dressing 5x5 (in/in) (DME) (Generic) 1 x Per Day/30 Days Discharge Instructions: Apply silicone border over primary dressing as directed. Secured With: 58M Medipore Public affairs consultant Surgical T 2x10 (in/yd) 1 x Per Day/30 Days ape Discharge Instructions: Secure with tape as directed. Compression Wrap: Kerlix Roll 4.5x3.1 (in/yd) (DME) (Generic) 1 x Per Day/30 Days Discharge Instructions: Apply Kerlix and Coban compression as directed. Compression Wrap: Coban Self-Adherent Wrap  4x5 (in/yd) (DME) (Generic) 1 x Per Day/30 Days Discharge Instructions: Apply over Kerlix as directed. Consults Ear, Nose Throat - For HBO eval - (ICD10 L97.523 - Non-pressure chronic ulcer of other part of left foot with necrosis of muscle) Patient Medications llergies: bee venom protein (honey bee) A Notifications Medication Indication Start End 09/10/2022 gentamicin DOSE topical 0.1 % ointment - Apply nickel thick layer to each wound with dressing changes GLYCEMIA INTERVENTIONS PROTOCOL PRE-HBO GLYCEMIA INTERVENTIONS ACTION INTERVENTION Obtain pre-HBO capillary blood glucose (ensure 1 physician order is in chart). A. Notify HBO physician and await physician orders. 2 If result is 70 mg/dl or below: B. If the result meets the hospital definition of a critical result, follow hospital policy. A. Give patient an 8 ounce Glucerna Shake, an 8 ounce Ensure, or 8 ounces of a Glucerna/Ensure equivalent dietary supplement*. B. Wait 30 minutes. If result is 71 mg/dl to 130 mg/dl: C. Retest patients capillary blood glucose (CBG). D. If result greater than or equal to 110 mg/dl, proceed with HBO. If result less than 110 mg/dl, notify HBO physician and consider holding HBO. If result is 131 mg/dl to 249 mg/dl: A. Proceed with HBO. A. Notify HBO physician and await physician orders. B. It is recommended to hold HBO and do If result is 250 mg/dl or greater: blood/urine ketone testing. C. If the result meets the hospital definition of a critical result, follow hospital policy. POST-HBO GLYCEMIA INTERVENTIONS ACTION INTERVENTION Obtain post HBO capillary blood glucose (ensure 1 physician order is in chart). A. Notify HBO physician and await physician orders. 2 If result is 70 mg/dl or below: B. If the result meets the hospital definition of a JESUSMANUEL, ERBES ([id number]) [id number] Page 6 of 14 critical result, follow hospital policy. A. Give patient  an 8 ounce Glucerna Shake, an 8 ounce Ensure, or 8 ounces of a Glucerna/Ensure equivalent dietary supplement*. B. Wait 15 minutes for symptoms of If result is 71 mg/dl to 100 mg/dl: hypoglycemia (i.e. nervousness, anxiety, sweating, chills, clamminess, irritability, confusion, tachycardia or dizziness). C. If patient asymptomatic, discharge patient. If patient symptomatic, repeat capillary blood glucose (CBG) and notify HBO physician. If result is 101 mg/dl to 249 mg/dl: A. Discharge patient. A. Notify HBO physician and await physician orders. B. It is recommended to do blood/urine ketone If result is 250 mg/dl or greater: testing. C. If the result meets the hospital definition of a critical result, follow hospital policy. *Juice or candies are NOT equivalent products. If patient refuses the Glucerna or Ensure, please consult the hospital dietitian for an appropriate substitute. Electronic Signature(s) Signed: 09/15/2022 4:56:11 PM By: Fredirick Maudlin MD FACS Signed: 09/15/2022 5:03:36 PM By: Blanche East RN Previous Signature: 09/10/2022  3:14:13 PM Version By: Fredirick Maudlin MD FACS Previous Signature: 09/10/2022 4:38:59 PM Version By: Blanche East RN Previous Signature: 09/10/2022 12:47:41 PM Version By: Fredirick Maudlin MD FACS Previous Signature: 09/10/2022 12:42:17 PM Version By: Fredirick Maudlin MD FACS Entered By: Blanche East on 09/15/2022 12:19:44 Prescription 09/10/2022 -------------------------------------------------------------------------------- [name] Fredirick Maudlin MD Patient Name: Provider: Sep 23, 1956 [id number] Date of Birth: NPI#: Jerilynn Mages [id number] Sex: DEA #: [phone] Phone #: License #: [place] Patient Address: [address] [place], [place] [address] [place], [address] [address] [phone] Allergies bee venom protein (honey bee) Provider's Orders Ear,  Nose Throat - ICD10: F75.102 - For HBO eval Hand Signature: Date(s): Electronic Signature(s) Signed: 09/15/2022 4:56:11 PM By: Fredirick Maudlin MD FACS Signed: 09/15/2022 5:03:36 PM By: Blanche East RN Previous Signature: 09/10/2022 3:14:13 PM Version By: Fredirick Maudlin MD FACS Previous Signature: 09/10/2022 4:38:59 PM Version By: Blanche East RN Entered By: Blanche East on 09/15/2022 12:19:44 Problem List Details -------------------------------------------------------------------------------- QUINCEY, QUESINBERRY ([id number]) [id number] Page 7 of 14 Patient Name: Date of Service: [place], [name] 09/10/2022 10:30 A M Medical Record Number: [id number] Patient Account Number: [id number] Date of Birth/Sex: Treating RN: June 30, 1956 (66 y.o. 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 09/10/2022 10:30 A M Medical Record Number: [id number] Patient Account Number:  [id number] Date of Birth/Sex: Treating RN: October 07, 1956 (66 y.o. M) Primary Care Provider: Nelda Bucks Other Clinician: Referring Provider: Treating Provider/Extender: Doristine Bosworth in Treatment: 0 Subjective Chief Complaint Information obtained from Patient Patients presents for treatment of an open diabetic ulcer and evaluation for hyperbaric oxygen therapy History of Present Illness (HPI) ADMISSION 09/10/2022 This is a 66 year old poorly controlled type II diabetic (last hemoglobin A1c 10.8%) who has had an ulcer on his heel for over 3 years. He has been seen in multiple wound care centers, including [facility] and [facility]. He reports that at least 3 doctors have recommended that he undergo below-knee amputation. He most recently met with Dr. Catalina Gravel, a vascular surgeon affiliated with [facility]. 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 Unfortunately, the patient has some extenuating social circumstances including the fact that he cares for his wife who has stage IV colon cancer and still works, driving vehicles for [employer]. 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 He was recently hospitalized at [facility] for sepsis and currently has a PICC line through which he receives IV antibiotics. He reports having had another MRI during that hospital stay along with a chest x-ray and EKG. He apparently contacted  one of the hyperbaric therapy techs here and asked a number of questions about hyperbaric oxygen treatments. He subsequently self-referred to our center to undergo further STANISLAV, GERVASE ([id number]) [id number] Page 8 of 14 evaluation and management. I mention to him that [facility] actually has hyperbaric chambers, but he states that he lives in [place] and this would be more convenient for him  given the intensive nature of the therapy and time requirement. ABI in clinic today was 0.94. The patient actually has 2 wounds. There is a wound on the dorsum of his left foot with heavy black eschar and slough present. After debridement, this was demonstrated to involve the muscle and the extensor tendons are exposed. On his heel, there is essentially a "shark bite" type wound, with much of the heel fat pad absent. The muscle layer is exposed. There is blue-green staining around the perimeter of the wound, but no significant odor. He says he has been applying collagen to the wound on his heel and Silvadene and Betadine to the wound on his dorsal foot. Patient History Allergies bee venom protein (honey bee) (Severity: Severe) Family History Cancer - Father,Mother, Diabetes - Mother,Father, Lung Disease - Father, Thyroid Problems - Mother, No family history of Heart Disease, Hereditary Spherocytosis, Hypertension, Kidney Disease, Seizures, Stroke, Tuberculosis. Social History Never smoker, Marital Status - Married, Alcohol Use - Never, Drug Use - No History, Caffeine Use - Daily. Medical History Eyes Patient has history of Cataracts Denies history of Glaucoma, Optic Neuritis Ear/Nose/Mouth/Throat Denies history of Chronic sinus problems/congestion, Middle ear problems Respiratory Patient has history of Sleep Apnea Cardiovascular Patient has history of Hypertension Gastrointestinal Denies history of Cirrhosis , Colitis, Crohnoos, Hepatitis A, Hepatitis B, Hepatitis C Endocrine Patient has history of Type II Diabetes Immunological Denies history of Lupus Erythematosus, Raynaudoos, Scleroderma Musculoskeletal Patient has history of Osteomyelitis - 2023 Neurologic Patient has history of Neuropathy - Bila lower extremities Oncologic Denies history of Received Chemotherapy, Received Radiation Psychiatric Denies history of Anorexia/bulimia, Confinement Anxiety Patient is treated with  Insulin. Blood sugar is tested. Hospitalization/Surgery History - I and D Left calcaneus. - back surgery- laminectomy. - eye surgery- Bila cataracts. - shoulder arthroscopy. Medical A Surgical History Notes nd Cardiovascular hyperlipidemia Genitourinary AKI Review of Systems (ROS) Constitutional Symptoms (General Health) Denies complaints or symptoms of Fatigue, Fever, Chills, Marked Weight Change. Eyes Complains or has symptoms of Glasses / Contacts - glasses. Ear/Nose/Mouth/Throat Denies complaints or symptoms of Chronic sinus problems or rhinitis. Cardiovascular Denies complaints or symptoms of Chest pain. Gastrointestinal Denies complaints or symptoms of Frequent diarrhea, Nausea, Vomiting. Integumentary (Skin) Lt heal Musculoskeletal Denies complaints or symptoms of Muscle Pain, Muscle Weakness. Psychiatric Denies complaints or symptoms of Claustrophobia. Objective Constitutional Hypertensive, asymptomatic. No acute distress. Vitals Time Taken: 10:41 AM, Height: 74 in, Source: Stated, Weight: 245 lbs, Source: Stated, BMI: 31.5, Temperature: 98.4 F, Pulse: 86 bpm, Respiratory Rate: 18 breaths/min, Blood Pressure: 180/84 mmHg, Capillary Blood Glucose: 147 mg/dl. Ears, Nose, Mouth, and Throat KONNAR, BEN R ([id number]) [id number] Page 9 of 14 Unable to adequately visualize tympanic membranes bilaterally secondary to cerumen accumulation. General Notes: 09/10/2022: The patient actually has 2 wounds. There is a wound on the dorsum of his left foot with heavy black eschar and slough present. After debridement, this was demonstrated to involve the muscle and the extensor tendons are exposed. On his heel, there is essentially a "shark bite" type wound, with much of the heel fat pad absent. The muscle layer is exposed. There is  blue-green staining around the perimeter of the wound, but no significant odor. He says he has been applying collagen to the  wound on his heel and Silvadene and Betadine to the wound on his dorsal foot. Integumentary (Hair, Skin) Wound #1 status is Open. Original cause of wound was Pressure Injury. The date acquired was: 03/09/2019. The wound is located on the Left Calcaneus. The wound measures 5.5cm length x 5cm width x 1cm depth; 21.598cm^2 area and 21.598cm^3 volume. There is Fat Layer (Subcutaneous Tissue) exposed. There is no tunneling noted, however, there is undermining starting at 10:00 and ending at 1:00 with a maximum distance of 2.5cm. There is a medium amount of serosanguineous drainage noted. The wound margin is fibrotic, thickened scar. There is medium (34-66%) red, pink granulation within the wound bed. There is a medium (34-66%) amount of necrotic tissue within the wound bed including Eschar and Adherent Slough. Wound #2 status is Open. Original cause of wound was Pressure Injury. The date acquired was: 07/09/2022. The wound is located on the Left,Dorsal Foot. The wound measures 4cm length x 3cm width x 0.1cm depth; 9.425cm^2 area and 0.942cm^3 volume. There is Fat Layer (Subcutaneous Tissue) exposed. There is no tunneling or undermining noted. There is a medium amount of serosanguineous drainage noted. There is small (1-33%) granulation within the wound bed. There is a large (67-100%) amount of necrotic tissue within the wound bed including Eschar and Adherent Slough. 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 He has undergone aggressive debridement, including partial calcanectomy and undergone multiple rounds of long-term  antibiotics. Despite this, he has persistent evidence of osteomyelitis in the left calcaneus. Several providers have recommended amputation, but the patient is interested in limb salvage and has requested evaluation for hyperbaric oxygen therapy. Site of CRO MRI obtained on 19 August 2022 demonstrates osteomyelitis of the left calcaneus Imaging studies to support CRO MRI performed 08/19/2022 demonstrates osteomyelitis of the left calcaneus Antibiotic outcome He has been on multiple long-term courses of antibiotics including 6 weeks of ciprofloxacin and Augmentin in April 2022, 6 weeks of doxycycline in May 2023, and he currently has a PICC line through which he is receiving IV Rocephin for a total of 6 weeks, starting on August 18, 2022. Surgical resection outcome Although multiple providers have recommended that he undergo below-knee amputation, the patient is interested in limb salvage and wishes to pursue hyperbaric oxygen therapy with this goal in mind. He did undergo a calcaneal ostectomy in 2020 after which imaging appears to have cleared, but he later developed more extensive involvement of the remaining calcaneus bone as demonstrated by an MRI performed in December 2021. CT scan results A CT angiogram was performed on 18 August 2022 this showed no hemodynamically significant stenosis of the aorta, iliac, or common femoral arteries bilaterally. 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 There is 50 to 75% stenosis of the P1 segment of left popliteal and P3 segment of the left popliteal. 75% stenosis of the left tibial peroneal trunk. MRI  results MRI scans performed on November 01, 2021 and August 19, 2022 both demonstrate extensive T2 hyperintense signal involving the calcaneus consistent with osteomyelitis. X-Ray  results Plain film x-rays performed on 04/23/2021 and 10/29/2021 both were suggestive of chronic osteomyelitis. KAHIL, AGNER ([id number]) [id number] Page 10 of 14 Other imaging results Plain chest x-ray obtained on August 15, 2022 did not demonstrate any active cardiopulmonary disease. Labs reviewed Hemoglobin on August 21, 2022 was 9.7 with a normal white blood cell count. Albumin on August 18, 2022 was 3.4, in the setting of sepsis and hospital admission. Plan of care/Summary This is a 65 year old diabetic with chronic refractory osteomyelitis. He has undergone surgical debridement as well as aggressive local wound therapy. He is currently receiving IV antibiotics. Despite this, he has evidence of ongoing osteomyelitis on his left calcaneus. Due to social circumstances and personal preference, he wishes to attempt limb salvage. I think the only way he is likely to accomplish this is if he undergoes hyperbaric oxygen therapy. I have recommended that he receive an initial 40 treatments at 2.5 atm of pressure with two 5-minute air breaks. We will continue to actively provide local wound care with debridement as indicated by the clinical picture of his wounds. He will complete his course of IV antibiotics as recommended by infectious disease. Procedures Wound #1 Pre-procedure diagnosis of Wound #1 is a Diabetic Wound/Ulcer of the Lower Extremity located on the Left Calcaneus .Severity of Tissue Pre Debridement is: Fat layer exposed. There was a Selective/Open Wound Skin/Epidermis Debridement with a total area of 27.5 sq cm performed by Fredirick Maudlin, MD. With the following instrument(s): Curette to remove Viable and Non-Viable tissue/material. Material removed includes Eschar, Callus, Slough, and Skin: Epidermis. No specimens were taken. A time out was conducted at 11:37, prior to the start of the procedure. A Minimum amount of bleeding was controlled with  Pressure. The procedure was tolerated well with a pain level of 0 throughout and a pain level of 0 following the procedure. Post Debridement Measurements: 5.5cm length x 5cm width x 1cm depth; 21.598cm^3 volume. Character of Wound/Ulcer Post Debridement requires further debridement. Severity of Tissue Post Debridement is: Fat layer exposed. Post procedure Diagnosis Wound #1: Same as Pre-Procedure General Notes: Scribed for Dr. Celine Ahr by Blanche East, RN. 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 There was a Excisional Skin/Subcutaneous Tissue/Muscle Debridement with a total area of 12 sq cm performed by Fredirick Maudlin, MD. With the following instrument(s): Curette to remove Viable and Non-Viable tissue/material. Material removed includes Fat, Muscle, Eschar, and Tendon. A time out was conducted at 11:37, prior to the start of the procedure. A Minimum amount of bleeding was controlled with Pressure. The procedure was tolerated well with a pain level of 0 throughout and a pain level of 0 following the procedure. Post Debridement Measurements: 4cm length x 3cm width x 0.1cm depth; 0.942cm^3 volume. Character of Wound/Ulcer Post Debridement requires further debridement. Severity of Tissue Post Debridement is: Fat layer exposed. Post procedure Diagnosis Wound #2: Same as Pre-Procedure General Notes: Scribed for Dr. Celine Ahr by Blanche East, RN. Plan Follow-up Appointments: Return Appointment in 1 week. - Dr. Celine Ahr Rm 4 Bathing/ Shower/ Hygiene: May shower with protection but do not get wound dressing(s) wet. Edema Control - Lymphedema / SCD / Other: Elevate legs to the level of the heart or above for 30 minutes  daily and/or when sitting, a frequency of: Avoid standing for long periods of time. Hyperbaric Oxygen Therapy: Wound #1 Left Calcaneus: Evaluate  for HBO Therapy If appropriate for treatment, begin HBOT per protocol: 2.5 ATA for 90 Minutes with 2 Five (5) Minute Air Breaks T Number of Treatments: - 40 otal One treatments per day (delivered Monday through Friday unless otherwise specified in Special Instructions below): Finger stick Blood Glucose Pre- and Post- HBOT Treatment. Follow Hyperbaric Oxygen Glycemia Protocol Afrin (Oxymetazoline HCL) 0.05% nasal spray - 1 spray in both nostrils daily as needed prior to HBO treatment for difficulty clearing ears Wound #2 Left,Dorsal Foot: Evaluate for HBO Therapy If appropriate for treatment, begin HBOT per protocol: Consults ordered were: Ear, Nose Throat - For HBO eval The following medication(s) was prescribed: gentamicin topical 0.1 % ointment Apply nickel thick layer to each wound with dressing changes starting 09/10/2022 WOUND #1: - Calcaneus Wound Laterality: Left Cleanser: Soap and Water 1 x Per Day/30 Days Discharge Instructions: May shower and wash wound with dial antibacterial soap and water prior to dressing change. Peri-Wound Care: Sween Lotion (Moisturizing lotion) 1 x Per Day/30 Days Discharge Instructions: Apply moisturizing lotion as directed Topical: Gentamicin 1 x Per Day/30 Days Discharge Instructions: As directed by physician Prim Dressing: KerraCel Ag Gelling Fiber Dressing, 4x5 in (silver alginate) (DME) (Generic) 1 x Per Day/30 Days MACALISTER, ARNAUD ([id number]) [id number] Page 11 of 14 Discharge Instructions: Apply silver alginate to wound bed as instructed Secondary Dressing: ABD Pad, 5x9 (DME) (Generic) 1 x Per Day/30 Days Discharge Instructions: Apply over primary dressing as directed. Secondary Dressing: Zetuvit Plus Silicone Border Dressing 5x5 (in/in) 1 x Per Day/30 Days Discharge Instructions: Apply silicone border over primary dressing as directed. Secured With: 26M Medipore Public affairs consultant Surgical T 2x10 (in/yd) 1 x Per Day/30  Days ape Discharge Instructions: Secure with tape as directed. Com pression Wrap: Kerlix Roll 4.5x3.1 (in/yd) (DME) (Generic) 1 x Per Day/30 Days Discharge Instructions: Apply Kerlix and Coban compression as directed. Com pression Wrap: Coban Self-Adherent Wrap 4x5 (in/yd) (DME) (Generic) 1 x Per Day/30 Days Discharge Instructions: Apply over Kerlix as directed. WOUND #2: - Foot Wound Laterality: Dorsal, Left Cleanser: Soap and Water 1 x Per Day/30 Days Discharge Instructions: May shower and wash wound with dial antibacterial soap and water prior to dressing change. Peri-Wound Care: Sween Lotion (Moisturizing lotion) 1 x Per Day/30 Days Discharge Instructions: Apply moisturizing lotion as directed Topical: Gentamicin 1 x Per Day/30 Days Discharge Instructions: As directed by physician Prim Dressing: KerraCel Ag Gelling Fiber Dressing, 4x5 in (silver alginate) (DME) (Generic) 1 x Per Day/30 Days ary Discharge Instructions: Apply silver alginate to wound bed as instructed Secondary Dressing: ALLEVYN Heel 4 1/2in x 5 1/2in / 10.5cm x 13.5cm (DME) (Generic) 1 x Per Day/30 Days Discharge Instructions: Apply over primary dressing as directed. Secondary Dressing: Zetuvit Plus Silicone Border Dressing 5x5 (in/in) (DME) (Generic) 1 x Per Day/30 Days Discharge Instructions: Apply silicone border over primary dressing as directed. Secured With: 26M Medipore Public affairs consultant Surgical T 2x10 (in/yd) 1 x Per Day/30 Days ape Discharge Instructions: Secure with tape as directed. Com pression Wrap: Kerlix Roll 4.5x3.1 (in/yd) (DME) (Generic) 1 x Per Day/30 Days Discharge Instructions: Apply Kerlix and Coban compression as directed. Com pression Wrap: Coban Self-Adherent Wrap 4x5 (in/yd) (DME) (Generic) 1 x Per Day/30 Days Discharge Instructions: Apply over Kerlix as directed. 09/10/2022: This is a poorly controlled type II diabetic who has had a heel ulcer for  3 years. Multiple providers have recommended that he  undergo below-knee amputation, but due to his social situation he says that he cannot. He self-referred to our center for further evaluation and management and consideration of hyperbaric oxygen therapy. The patient actually has 2 wounds. There is a wound on the dorsum of his left foot with heavy black eschar and slough present. After debridement, this was demonstrated to involve the muscle and the extensor tendons are exposed. On his heel, there is essentially a "shark bite" type wound, with much of the heel fat pad absent. The muscle layer is exposed. There is blue-green staining around the perimeter of the wound, but no significant odor. He says he has been applying collagen to the wound on his heel and Silvadene and Betadine to the wound on his dorsal foot. I used a curette to debride eschar, slough, and muscle from the dorsal foot wound. I debrided periwound callus and slough from the left calcaneal wound. We are going to apply topical gentamicin and silver alginate to both, wrapping the foot with Kerlix and Coban. We will need to obtain his EKG and chest x-ray results from [facility]; I reviewed the MRI that was performed at [facility] in November 2022 that does demonstrate evidence of osteomyelitis in the calcaneus. He has had a more recent 1, per his report and we will try to get that result as well. I do think that his only hope for limb salvage is hyperbaric oxygen therapy. I cautioned him that it may not have the result he desires but that we are going to give it an effort. I recommended 40 treatments at 2.5 atm of pressure with two 5-minute air breaks. We will begin a work-up to try and get this treatment initiated. Electronic Signature(s) Signed: 09/21/2022 4:04:10 PM By: Fredirick Maudlin MD FACS Previous Signature: 09/10/2022 12:46:45 PM Version By: Fredirick Maudlin MD FACS Previous Signature: 09/10/2022 12:45:05 PM Version By: Fredirick Maudlin MD FACS Entered By: Fredirick Maudlin on  09/21/2022 16:04:10 -------------------------------------------------------------------------------- HxROS Details Patient Name: Date of Service: [name], [name] 09/10/2022 10:30 A M Medical Record Number: [id number] Patient Account Number: [id number] Date of Birth/Sex: Treating RN: 1956-05-31 (66 y.o. [name] Primary Care Provider: Nelda Bucks Other Clinician: Referring Provider: Treating Provider/Extender: Doristine Bosworth in Treatment: 0 Constitutional Symptoms (General Health) Complaints and Symptoms: Negative for: Fatigue; Fever; Chills; Marked Weight Change Eyes Complaints and Symptoms: Positive for: Glasses / Contacts - glasses Medical History: Positive for: Cataracts DARRLY, LOBERG ([id number]) [id number] Page 12 of 14 Negative for: Glaucoma; Optic Neuritis Ear/Nose/Mouth/Throat Complaints and Symptoms: Negative for: Chronic sinus problems or rhinitis Medical History: Negative for: Chronic sinus problems/congestion; Middle ear problems Cardiovascular Complaints and Symptoms: Negative for: Chest pain Medical History: Positive for: Hypertension Past Medical History Notes: hyperlipidemia Gastrointestinal Complaints and Symptoms: Negative for: Frequent diarrhea; Nausea; Vomiting Medical History: Negative for: Cirrhosis ; Colitis; Crohns; Hepatitis A; Hepatitis B; Hepatitis C Musculoskeletal Complaints and Symptoms: Negative for: Muscle Pain; Muscle Weakness Medical History: Positive for: Osteomyelitis - 2023 Psychiatric Complaints and Symptoms: Negative for: Claustrophobia Medical History: Negative for: Anorexia/bulimia; Confinement Anxiety Respiratory Medical History: Positive for: Sleep Apnea Endocrine Medical History: Positive for: Type II Diabetes Time with diabetes: 20 years Treated with: Insulin Blood sugar tested every day: Yes Tested : 2-3 Genitourinary Medical History: Past  Medical History Notes: AKI Immunological Medical History: Negative for: Lupus Erythematosus; Raynauds; Scleroderma Integumentary (Skin) Complaints and Symptoms: Review of System Notes: Lt heal Neurologic Medical  History: Positive for: Neuropathy - Bila lower extremities Oncologic MUAAZ, BRAU ([id number]) [id number] Page 13 of 14 Medical History: Negative for: Received Chemotherapy; Received Radiation HBO Extended History Items Eyes: Cataracts Immunizations Pneumococcal Vaccine: Received Pneumococcal Vaccination: Yes Received Pneumococcal Vaccination On or After 60th Birthday: Yes Implantable Devices Yes Hospitalization / Surgery History Type of Hospitalization/Surgery I and D Left calcaneus back surgery- laminectomy eye surgery- Bila cataracts shoulder arthroscopy Family and Social History Cancer: Yes - Father,Mother; Diabetes: Yes - Mother,Father; Heart Disease: No; Hereditary Spherocytosis: No; Hypertension: No; Kidney Disease: No; Lung Disease: Yes - Father; Seizures: No; Stroke: No; Thyroid Problems: Yes - Mother; Tuberculosis: No; Never smoker; Marital Status - Married; Alcohol Use: Never; Drug Use: No History; Caffeine Use: Daily; Financial Concerns: No; Food, Clothing or Shelter Needs: No; Support System Lacking: No; Transportation Concerns: No Physician Affirmation I have reviewed and agree with the above information. Electronic Signature(s) Signed: 09/10/2022 12:47:41 PM By: Fredirick Maudlin MD FACS Signed: 09/10/2022 4:38:59 PM By: Blanche East RN Entered By: Blanche East on 09/10/2022 10:57:20 -------------------------------------------------------------------------------- SuperBill Details Patient Name: Date of Service: [place], [name] 09/10/2022 Medical Record Number: [id number] Patient Account Number: [id number] Date of Birth/Sex: Treating RN: 10-10-1956 (66 y.o. 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 Facility Procedures : CPT4 Code: 33007622 Description: 63335 - WOUND CARE VISIT-LEV 4 NEW PT Modifier: 25 Quantity: 1 : Petersen, RON 25 CPT4 Code: 45625638 ALD R ([id number]) [id number] 975 IC L Description: 11043 - DEB MUSC/FASCIA 20 SQ CM/< ICD-10 Diagnosis Description L97.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 [id number] - DEBRIDE WOUND 1ST 20 SQ CM OR < D-10 Diagnosis Description  97.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 Modifier: [id number] 1 Quantity: 1 .pdf Page 14 of 14 : 76 CPT4 Code: 468032 122 IC L Description: 5 - DEBRIDE WOUND EA ADDL 20 SQ CM D-10 Diagnosis Description 97.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 Modifier: 1 Quantity: Physician Procedures : CPT4 Code Description Modifier [id number] 70488 - WC PHYS LEVEL 4 - NEW PT 25 ICD-10 Diagnosis Description 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 E11.621 Type 2 diabetes mellitus with foot ulcer 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, left ankle and foot Quantity: 1 : [id number] 03888 - WC PHYS DEBR MUSCLE/FASCIA 20 SQ CM ICD-10 Diagnosis Description L97.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 Quantity: 1 : [id number] 17915 - WC PHYS DEBR WO ANESTH 20  SQ CM ICD-10 Diagnosis Description 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

## 2022-09-10 NOTE — Progress Notes (Signed)
FRIEND, VANDERPOEL ([id number]) Visit Report for 09/10/2022 Allergy List Details Patient Name: Date of Service: [name], [name] [name] LD R. 09/10/2022 10:30 A M Medical Record Number: [id number] Patient Account Number: [id number] Date of Birth/Sex: Treating RN: 12/05/1956 (67 y.o. [name] Primary Care [name]: [name] Other Clinician: Referring [name]: Treating [name]/Extender: [name], TA [name] in Treatment: 0 Allergies Active Allergies bee venom protein (honey bee) Severity: Severe Allergy Notes Electronic Signature(s) Signed: 09/10/2022 4:38:59 PM By: [name] East RN Entered By: [name] on 09/10/2022 10:45:17 -------------------------------------------------------------------------------- Arrival Information Details Patient Name: Date of Service: [name], [name] [name] LD R. 09/10/2022 10:30 A M Medical Record Number: [id number] Patient Account Number: [id number] Date of Birth/Sex: Treating RN: 1956-09-24 (66 y.o. [name] Primary Care [name]: [name] Other Clinician: Referring [name]: Treating [name]/Extender: [name], TA [name] in Treatment: 0 Visit Information Patient Arrived: Ambulatory Arrival Time: 10:41 Accompanied By: Self Transfer Assistance: None Patient Identification Verified: Yes Secondary Verification Process Completed: Yes Patient Requires Transmission-Based Precautions: No Patient Has Alerts: No Electronic Signature(s) Signed: 09/10/2022 4:38:59 PM By: [name] East RN Entered By: [name] on 09/10/2022 10:41:39 -------------------------------------------------------------------------------- Clinic Level of Care Assessment Details Patient Name: Date of Service: [name], [name] [name] LD R. 09/10/2022 10:30 A M Medical Record Number: [id number] Patient Account Number: [id number] Date of Birth/Sex: Treating RN: June 23, 1956 (66 y.o. [name] Primary Care [name]: [name] Other  Clinician: Referring [name]: Treating [name]/Extender: [name], TA [name] in Treatment: 0 Clinic Level of Care Assessment Items TOOL 1 Quantity Score X- 1 0 Use when EandM and Procedure is performed on INITIAL visit ASSESSMENTS - Nursing Assessment / Reassessment X- 1 20 General Physical Exam (combine w/ comprehensive assessment (listed just below) when performed on new pt. evals) X- 1 25 Comprehensive Assessment (HX, ROS, Risk Assessments, Wounds Hx, etc.) ASSESSMENTS - Wound and Skin Assessment / Reassessment X- 1 10 Dermatologic / Skin Assessment (not related to wound area) ASSESSMENTS - Ostomy and/or Continence Assessment and Care []  - 0 Incontinence Assessment and Management []  - 0 Ostomy Care Assessment and Management (repouching, etc.) PROCESS - Coordination of Care []  - 0 Simple Patient / Family Education for ongoing care X- 1 20 Complex (extensive) Patient / Family Education for ongoing care X- 1 10 Staff obtains Programmer, systems, Records, T Results / Process Orders est X- 1 10 Staff telephones HHA, Nursing Homes / Clarify orders / etc X- 1 10 Routine Transfer to another Facility (non-emergent condition) []  - 0 Routine Hospital Admission (non-emergent condition) []  - 0 New Admissions / Biomedical engineer / Ordering NPWT Apligraf, etc. , []  - 0 Emergency Hospital Admission (emergent condition) PROCESS - Special Needs []  - 0 Pediatric / Minor Patient Management []  - 0 Isolation Patient Management []  - 0 Hearing / Language / Visual special needs []  - 0 Assessment of Community assistance (transportation, D/C planning, etc.) []  - 0 Additional assistance / Altered mentation []  - 0 Support Surface(s) Assessment (bed, cushion, seat, etc.) INTERVENTIONS - Miscellaneous []  - 0 External ear exam []  - 0 Patient Transfer (multiple staff / Civil Service fast streamer / Similar devices) []  - 0 Simple Staple / Suture removal (25 or less) []  - 0 Complex Staple  / Suture removal (26 or more) []  - 0 Hypo/Hyperglycemic Management (do not check if billed separately) X- 1 15 Ankle / Brachial Index (ABI) - do not check if billed separately Has the patient been seen at the hospital within the last three years:  Yes Total Score: 120 Level Of Care: New/Established - Level 4 Electronic Signature(s) Signed: 09/10/2022 4:38:59 PM By: [name] East RN Entered By: [name] on 09/10/2022 14:47:37 -------------------------------------------------------------------------------- Encounter Discharge Information Details Patient Name: Date of Service: [name], [name] [name] LD R. 09/10/2022 10:30 A M Medical Record Number: [id number] Patient Account Number: [id number] Date of Birth/Sex: Treating RN: [name] (66 y.o. [name] Primary Care [name]: [name] Other Clinician: Referring [name]: Treating [name]/Extender: [name], TA [name] in Treatment: 0 Encounter Discharge Information Items Post Procedure Vitals Discharge Condition: Stable Temperature (F): 98.4 Ambulatory Status: Ambulatory Pulse (bpm): 86 Discharge Destination: Home Respiratory Rate (breaths/min): 18 Transportation: Private Auto Blood Pressure (mmHg): 180/84 Accompanied By: self Schedule Follow-up Appointment: Yes Clinical Summary of Care: Electronic Signature(s) Signed: 09/10/2022 4:38:59 PM By: [name] East RN Entered By: [name] on 09/10/2022 12:11:43 -------------------------------------------------------------------------------- Lower Extremity Assessment Details Patient Name: Date of Service: [name], [name] [name] LD R. 09/10/2022 10:30 A M Medical Record Number: [id number] Patient Account Number: [id number] Date of Birth/Sex: Treating RN: October 24, 1956 (66 y.o. [name] Primary Care [name]: [name] Other Clinician: Referring [name]: Treating [name]/Extender: [name], TA [name] in Treatment: 0 Edema  Assessment Assessed: [Left: No] [Right: No] E[Left: dema] [Right: :] Calf Left: Right: Point of Measurement: From Medial Instep 44 cm Ankle Left: Right: Point of Measurement: From Medial Instep 26 cm Vascular Assessment Pulses: Dorsalis Pedis Palpable: [Left:Yes] Doppler Audible: [Left:Yes] Blood Pressure: Brachial: [Left:180] Dorsalis Pedis: 170 Ankle: Posterior Tibial: 120 Ankle Brachial Index: [Left:0.94] Electronic Signature(s) Signed: 09/10/2022 4:38:59 PM By: [name] East RN Entered By: [name] on 09/10/2022 11:22:45 -------------------------------------------------------------------------------- Multi Wound Chart Details Patient Name: Date of Service: [name], RO [name] LD R. 09/10/2022 10:30 A M Medical Record Number: [id number] Patient Account Number: [id number] Date of Birth/Sex: Treating RN: [birth year] (66 y.o. M) Primary Care Chevon Laufer: [name] Other Clinician: Referring Cacey Willow: Treating Brandey Vandalen/Extender: [name], TA [name] in Treatment: 0 Vital Signs Height(in): 74 Capillary Blood Glucose(mg/dl): 147 Weight(lbs): 245 Pulse(bpm): 35 Body Mass Index(BMI): 31.5 Blood Pressure(mmHg): 180/84 Temperature(F): 98.4 Respiratory Rate(breaths/min): 18 Photos: [N/A:N/A] Left Calcaneus Left, Dorsal Foot N/A Wound Location: Pressure Injury Pressure Injury N/A Wounding Event: Diabetic Wound/Ulcer of the Lower Diabetic Wound/Ulcer of the Lower N/A Primary Etiology: Extremity Extremity Cataracts, Sleep Apnea, Hypertension, Cataracts, Sleep Apnea, Hypertension, N/A Comorbid History: Type II Diabetes, Osteomyelitis, Type II Diabetes, Osteomyelitis, Neuropathy Neuropathy 03/09/2019 07/09/2022 N/A Date Acquired: 0 0 N/A [name] of Treatment: Open Open N/A Wound Status: No No N/A Wound Recurrence: 5.5x5x1 4x3x0.1 N/A Measurements L x W x D (cm) 21.598 9.425 N/A A (cm) : rea 21.598 0.942 N/A Volume (cm) : 10 Starting Position  1 (o'clock): 1 Ending Position 1 (o'clock): 2.5 Maximum Distance 1 (cm): Yes No N/A Undermining: Grade 3 Grade 2 N/A Classification: Medium Medium N/A Exudate A mount: Serosanguineous Serosanguineous N/A Exudate Type: red, brown red, brown N/A Exudate Color: Fibrotic scar, thickened scar N/A N/A Wound Margin: Medium (34-66%) Small (1-33%) N/A Granulation A mount: Red, Pink N/A N/A Granulation Quality: Medium (34-66%) Large (67-100%) N/A Necrotic A mount: Eschar, Adherent Slough Eschar, Adherent Slough N/A Necrotic Tissue: Fat Layer (Subcutaneous Tissue): Yes Fat Layer (Subcutaneous Tissue): Yes N/A Exposed Structures: Fascia: No Fascia: No Tendon: No Tendon: No Muscle: No Muscle: No Joint: No Joint: No Bone: No Bone: No Debridement - Selective/Open Wound Debridement - Excisional N/A Debridement: Pre-procedure Verification/Time Out 11:37 11:37 N/A Taken: Necrotic/Eschar, Callus, Slough Necrotic/Eschar, Muscle, Fat, Tendon N/A Tissue Debrided:  Skin/Epidermis Skin/Subcutaneous Tissue/Muscle N/A Level: 27.5 12 N/A Debridement A (sq cm): rea Curette Curette N/A Instrument: Minimum Minimum N/A Bleeding: Pressure Pressure N/A Hemostasis A chieved: 0 0 N/A Procedural Pain: 0 0 N/A Post Procedural Pain: Procedure was tolerated well Procedure was tolerated well N/A Debridement Treatment Response: 5.5x5x1 4x3x0.1 N/A Post Debridement Measurements L x W x D (cm) 21.598 0.942 N/A Post Debridement Volume: (cm) Debridement Debridement N/A Procedures Performed: Treatment Notes Wound #1 (Calcaneus) Wound Laterality: Left Cleanser Soap and Water Discharge Instruction: May shower and wash wound with dial antibacterial soap and water prior to dressing change. Peri-Wound Care Sween Lotion (Moisturizing lotion) Discharge Instruction: Apply moisturizing lotion as directed Topical Gentamicin Discharge Instruction: As directed by physician Primary  Dressing KerraCel Ag Gelling Fiber Dressing, 4x5 in (silver alginate) Discharge Instruction: Apply silver alginate to wound bed as instructed Secondary Dressing Zetuvit Plus Silicone Border Dressing 5x5 (in/in) Discharge Instruction: Apply silicone border over primary dressing as directed. Secured With Compression Wrap Kerlix Roll 4.5x3.1 (in/yd) Discharge Instruction: Apply Kerlix and Coban compression as directed. Coban Self-Adherent Wrap 4x5 (in/yd) Discharge Instruction: Apply over Kerlix as directed. Compression Stockings Add-Ons Wound #2 (Foot) Wound Laterality: Dorsal, Left Cleanser Soap and Water Discharge Instruction: May shower and wash wound with dial antibacterial soap and water prior to dressing change. Peri-Wound Care Sween Lotion (Moisturizing lotion) Discharge Instruction: Apply moisturizing lotion as directed Topical Gentamicin Discharge Instruction: As directed by physician Primary Dressing KerraCel Ag Gelling Fiber Dressing, 4x5 in (silver alginate) Discharge Instruction: Apply silver alginate to wound bed as instructed Secondary Dressing Zetuvit Plus Silicone Border Dressing 5x5 (in/in) Discharge Instruction: Apply silicone border over primary dressing as directed. Secured With [employer] T 2x10 (in/yd) ape Discharge Instruction: Secure with tape as directed. Compression Wrap Kerlix Roll 4.5x3.1 (in/yd) Discharge Instruction: Apply Kerlix and Coban compression as directed. Coban Self-Adherent Wrap 4x5 (in/yd) Discharge Instruction: Apply over Kerlix as directed. Compression Stockings Add-Ons Electronic Signature(s) Signed: 09/10/2022 12:25:49 PM By: [name] MD FACS Entered By: [name] on 09/10/2022 12:25:49 -------------------------------------------------------------------------------- Multi-Disciplinary Care Plan Details Patient Name: Date of Service: [place], [name] [name] LD R. 09/10/2022 10:30 A M Medical Record  Number: [id number] Patient Account Number: [id number] Date of Birth/Sex: Treating RN: 1956-04-23 (66 y.o. [name] Primary Care Tiffiany Beadles: Foye [name] Other Clinician: Referring Jamone Garrido: Treating Annamarie Yamaguchi/Extender: Marylene Buerger, TA [name] in Treatment: 0 Active Inactive Orientation to the Wound Care Program Nursing Diagnoses: Knowledge deficit related to the wound healing center program Goals: Patient/caregiver will verbalize understanding of the Wound Healing Center Program Date Initiated: 09/10/2022 Target Resolution Date: 09/17/2022 Goal Status: Active Interventions: Provide education on orientation to the wound center Notes: Wound/Skin Impairment Nursing Diagnoses: Impaired tissue integrity Goals: Ulcer/skin breakdown will have a volume reduction of 30% by week 4 Date Initiated: 09/10/2022 Target Resolution Date: 10/08/2022 Goal Status: Active Interventions: Assess ulceration(s) every visit Provide education on ulcer and skin care Treatment Activities: Consult for HBO : 09/10/2022 Skin care regimen initiated : 09/10/2022 Notes: Electronic Signature(s) Signed: 09/10/2022 4:38:59 PM By: Tommie Ard RN Entered By: Tommie Ard on 09/10/2022 11:48:45 -------------------------------------------------------------------------------- Pain Assessment Details Patient Name: Date of Service: [place], [name] [name] LD R. 09/10/2022 10:30 A M Medical Record Number: [id number] Patient Account Number: [id number] Date of Birth/Sex: Treating RN: [birth year] (66 y.o. [name] Primary Care Emmilia Sowder: Foye [name] Other Clinician: Referring Rhyse Loux: Treating Vetra Shinall/Extender: Marylene Buerger, TA [name] in Treatment: 0 Active Problems Location of Pain Severity and Description of  Pain Patient Has Paino No Site Locations Pain Management and Medication Current Pain Management: Electronic Signature(s) Signed: 09/10/2022 4:38:59 PM By: [name] East  RN Entered By: [name] on 09/10/2022 11:31:47 -------------------------------------------------------------------------------- Patient/Caregiver Education Details Patient Name: Date of Service: [place]ace [place] [name] LD R. 9/29/2023andnbsp10:30 A M Medical Record Number: [id number] Patient Account Number: [id number] Date of Birth/Gender: Treating RN: Aug 29, 1956 (66 y.o. [name] Primary Care Physician: [name] Other Clinician: Referring Physician: Treating Physician/Extender: [name], TA [name] in Treatment: 0 Education Assessment Education Provided To: Patient Education Topics Provided Elevated Blood Sugar/ Impact on Healing: Handouts: Elevated Blood Sugars: How Do They Affect Wound Healing Methods: Explain/Verbal Responses: Reinforcements needed, State content correctly Hyperbaric Oxygenation: Handouts: Hyperbaric Oxygen Methods: Explain/Verbal Responses: Reinforcements needed, State content correctly Welcome T [facility] [place]: o Handouts: Welcome T [facility] [place] o Methods: Explain/Verbal Responses: Reinforcements needed, State content correctly Wound/Skin Impairment: Handouts: Caring for Your Ulcer Methods: Explain/Verbal Responses: Reinforcements needed, State content correctly Electronic Signature(s) Signed: 09/10/2022 4:38:59 PM By: [name] East RN Entered By: [name] on 09/10/2022 11:49:29 -------------------------------------------------------------------------------- Wound Assessment Details Patient Name: Date of Service: [place], [name] [name] LD R. 09/10/2022 10:30 A M Medical Record Number: [id number] Patient Account Number: [id number] Date of Birth/Sex: Treating RN: 1956/07/05 (66 y.o. [name] Primary Care Abdulhadi Stopa: Nelda [name] Other Clinician: Referring Lounell Schumacher: Treating Amond Speranza/Extender: [name], TA [name] in Treatment: 0 Wound Status Wound Number: 1 Primary Diabetic  Wound/Ulcer of the Lower Extremity Etiology: Wound Location: Left Calcaneus Wound Open Wounding Event: Pressure Injury Status: Date Acquired: 03/09/2019 Comorbid Cataracts, Sleep Apnea, Hypertension, Type II Diabetes, [name] Of Treatment: 0 History: Osteomyelitis, Neuropathy Clustered Wound: No Photos Wound Measurements Length: (cm) 5.5 Width: (cm) 5 Depth: (cm) 1 Area: (cm) 21.598 Volume: (cm) 21.598 % Reduction in Area: % Reduction in Volume: Tunneling: No Undermining: Yes Starting Position (o'clock): 10 Ending Position (o'clock): 1 Maximum Distance: (cm) 2.5 Wound Description Classification: Grade 3 Wound Margin: Fibrotic scar, thickened scar Exudate Amount: Medium Exudate Type: Serosanguineous Exudate Color: red, brown Foul Odor After Cleansing: No Slough/Fibrino Yes Wound Bed Granulation Amount: Medium (34-66%) Exposed Structure Granulation Quality: Red, Pink Fascia Exposed: No Necrotic Amount: Medium (34-66%) Fat Layer (Subcutaneous Tissue) Exposed: Yes Necrotic Quality: Eschar, Adherent Slough Tendon Exposed: No Muscle Exposed: No Joint Exposed: No Bone Exposed: No Treatment Notes Wound #1 (Calcaneus) Wound Laterality: Left Cleanser Soap and Water Discharge Instruction: May shower and wash wound with dial antibacterial soap and water prior to dressing change. Peri-Wound Care Sween Lotion (Moisturizing lotion) Discharge Instruction: Apply moisturizing lotion as directed Topical Gentamicin Discharge Instruction: As directed by physician Primary Dressing KerraCel Ag Gelling Fiber Dressing, 4x5 in (silver alginate) Discharge Instruction: Apply silver alginate to wound bed as instructed Secondary Dressing Zetuvit Plus Silicone Border Dressing 5x5 (in/in) Discharge Instruction: Apply silicone border over primary dressing as directed. Secured With Compression Wrap Kerlix Roll 4.5x3.1 (in/yd) Discharge Instruction: Apply Kerlix and Coban compression as  directed. Coban Self-Adherent Wrap 4x5 (in/yd) Discharge Instruction: Apply over Kerlix as directed. Compression Stockings Add-Ons Electronic Signature(s) Signed: 09/10/2022 4:38:59 PM By: [name] East RN Entered By: [name] on 09/10/2022 11:31:12 -------------------------------------------------------------------------------- Wound Assessment Details Patient Name: Date of Service: [place], [name] [name] LD R. 09/10/2022 10:30 A M Medical Record Number: [id number] Patient Account Number: [id number] Date of Birth/Sex: Treating RN: 12-Oct-1956 (66 y.o. [name] Primary Care Travin Marik: [name] Other Clinician: Referring Abenezer Odonell: Treating Modelle Vollmer/Extender: [name], TA [name] in Treatment:  0 Wound Status Wound Number: 2 Primary Diabetic Wound/Ulcer of the Lower Extremity Etiology: Wound Location: Left, Dorsal Foot Wound Open Wounding Event: Pressure Injury Status: Date Acquired: 07/09/2022 Comorbid Cataracts, Sleep Apnea, Hypertension, Type II Diabetes, Comorbid Cataracts, Sleep Apnea, Hypertension, Type II Diabetes, [name] Of Treatment: 0 History: Osteomyelitis, Neuropathy Clustered Wound: No Photos Wound Measurements Length: (cm) 4 Width: (cm) 3 Depth: (cm) 0.1 Area: (cm) 9.425 Volume: (cm) 0.942 % Reduction in Area: % Reduction in Volume: Tunneling: No Undermining: No Wound Description Classification: Grade 2 Exudate Amount: Medium Exudate Type: Serosanguineous Exudate Color: red, brown Foul Odor After Cleansing: No Slough/Fibrino Yes Wound Bed Granulation Amount: Small (1-33%) Exposed Structure Necrotic Amount: Large (67-100%) Fascia Exposed: No Necrotic Quality: Eschar, Adherent Slough Fat Layer (Subcutaneous Tissue) Exposed: Yes Tendon Exposed: No Muscle Exposed: No Joint Exposed: No Bone Exposed: No Treatment Notes Wound #2 (Foot) Wound Laterality: Dorsal, Left Cleanser Soap and Water Discharge Instruction: May shower  and wash wound with dial antibacterial soap and water prior to dressing change. Peri-Wound Care Sween Lotion (Moisturizing lotion) Discharge Instruction: Apply moisturizing lotion as directed Topical Gentamicin Discharge Instruction: As directed by physician Primary Dressing KerraCel Ag Gelling Fiber Dressing, 4x5 in (silver alginate) Discharge Instruction: Apply silver alginate to wound bed as instructed Secondary Dressing Zetuvit Plus Silicone Border Dressing 5x5 (in/in) Discharge Instruction: Apply silicone border over primary dressing as directed. Secured With [employer] Surgical T 2x10 (in/yd) ape Discharge Instruction: Secure with tape as directed. Compression Wrap Kerlix Roll 4.5x3.1 (in/yd) Discharge Instruction: Apply Kerlix and Coban compression as directed. Coban Self-Adherent Wrap 4x5 (in/yd) Discharge Instruction: Apply over Kerlix as directed. Compression Stockings Add-Ons Electronic Signature(s) Signed: 09/10/2022 4:38:59 PM By: [name] East RN Entered By: [name] on 09/10/2022 11:31:36 -------------------------------------------------------------------------------- Vitals Details Patient Name: Date of Service: [name], [name] [name] LD R. 09/10/2022 10:30 A M Medical Record Number: [id number] Patient Account Number: [id number] Date of Birth/Sex: Treating RN: 1956-07-26 (66 y.o. [name] Primary Care Terrianna Holsclaw: [name] Other Clinician: Referring Georgene Kopper: Treating Eshaan Titzer/Extender: [name], TA [name] in Treatment: 0 Vital Signs Time Taken: 10:41 Temperature (F): 98.4 Height (in): 74 Pulse (bpm): 86 Source: Stated Respiratory Rate (breaths/min): 18 Weight (lbs): 245 Blood Pressure (mmHg): 180/84 Source: Stated Capillary Blood Glucose (mg/dl): 147 Body Mass Index (BMI): 31.5 Reference Range: 80 - 120 mg / dl Electronic Signature(s) Signed: 09/10/2022 4:38:59 PM By: [name] East RN Entered By: [name]  on 09/10/2022 10:44:45

## 2022-09-20 ENCOUNTER — Encounter (HOSPITAL_BASED_OUTPATIENT_CLINIC_OR_DEPARTMENT_OTHER): Payer: Medicare Other | Attending: General Surgery | Admitting: General Surgery

## 2022-09-20 DIAGNOSIS — E1169 Type 2 diabetes mellitus with other specified complication: Secondary | ICD-10-CM | POA: Insufficient documentation

## 2022-09-20 DIAGNOSIS — L97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: Secondary | ICD-10-CM | POA: Insufficient documentation

## 2022-09-20 DIAGNOSIS — E11621 Type 2 diabetes mellitus with foot ulcer: Secondary | ICD-10-CM | POA: Insufficient documentation

## 2022-09-20 DIAGNOSIS — E1165 Type 2 diabetes mellitus with hyperglycemia: Secondary | ICD-10-CM | POA: Diagnosis not present

## 2022-09-20 DIAGNOSIS — M86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, left ankle and foot: Secondary | ICD-10-CM | POA: Diagnosis not present

## 2022-09-20 DIAGNOSIS — L97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: Secondary | ICD-10-CM | POA: Diagnosis not present

## 2022-09-20 NOTE — Progress Notes (Addendum)
[name], [name] [name] ([id number]) [id number] Page 1 of 12 Visit Report for 09/20/2022 Chief Complaint Document Details Patient Name: Date of Service: [name], [name]. 09/20/2022 1:00 PM Medical Record Number: [id number] Patient Account Number: [id number] Date of Birth/Sex: Treating RN: 29-Nov-1956 (66 y.o. M) Primary Care Provider: Nelda [name] Other Clinician: Referring Provider: Treating Provider/Extender: [name] in Treatment: 1 Information Obtained from: Patient Chief Complaint Patients presents for treatment of an open diabetic ulcer and evaluation for hyperbaric oxygen therapy Electronic Signature(s) Signed: 09/20/2022 2:32:07 PM By: [name] Maudlin MD FACS Entered By: [name] on 09/20/2022 14:32:06 -------------------------------------------------------------------------------- Debridement Details Patient Name: Date of Service: [name], [name]. 09/20/2022 1:00 PM Medical Record Number: [id number] Patient Account Number: [id number] Date of Birth/Sex: Treating RN: Apr 18, 1956 (66 y.o. M) Primary Care Provider: Nelda [name] Other Clinician: Referring Provider: Treating Provider/Extender: [name] in Treatment: 1 Debridement Performed for Assessment: Wound #2 Left,Dorsal Foot Performed By: Physician [name] Maudlin, MD Debridement Type: Debridement Severity of Tissue Pre Debridement: Fat layer exposed Level of Consciousness (Pre-procedure): Awake and Alert Pre-procedure Verification/Time Out Yes - 13:31 Taken: Start Time: 13:32 T Area Debrided (L x W): otal 4 (cm) x 2.8 (cm) = 11.2 (cm) Tissue and other material debrided: Viable, Non-Viable, Slough, Subcutaneous, Slough Level: Skin/Subcutaneous Tissue Debridement Description: Excisional Instrument: Curette Bleeding: Minimum Hemostasis Achieved: Pressure Procedural Pain: 0 Post Procedural Pain: 0 Response to  Treatment: Procedure was tolerated well Level of Consciousness (Post- Awake and Alert procedure): Post Debridement Measurements of Total Wound Length: (cm) 4 Width: (cm) 2.8 Depth: (cm) 0.1 Volume: (cm) 0.88 Character of Wound/Ulcer Post Debridement: Requires Further Debridement Severity of Tissue Post Debridement: Fat layer exposed Post Procedure Diagnosis [name], [name] ([id number]) [id number] Page 2 of 12 Same as Pre-procedure Notes Scribed for Dr. Celine [name] by [name] East, RN Electronic Signature(s) Signed: 09/20/2022 2:36:11 PM By: [name] Maudlin MD FACS Entered By: [name] on 09/20/2022 14:36:11 -------------------------------------------------------------------------------- Debridement Details Patient Name: Date of Service: [name], [name]. 09/20/2022 1:00 PM Medical Record Number: [id number] Patient Account Number: [id number] Date of Birth/Sex: Treating RN: [birth year] (66 y.o. M) Primary Care Provider: Nelda [name] Other Clinician: Referring Provider: Treating Provider/Extender: [name] in Treatment: 1 Debridement Performed for Assessment: Wound #1 Left Calcaneus Performed By: Physician [name] Maudlin, MD Debridement Type: Debridement Severity of Tissue Pre Debridement: Fat layer exposed Level of Consciousness (Pre-procedure): Awake and Alert Pre-procedure Verification/Time Out Yes - 13:31 Taken: Start Time: 13:32 T Area Debrided (L x W): otal 5 (cm) x 5.8 (cm) = 29 (cm) Tissue and other material debrided: Viable, Non-Viable, Callus, Slough, Subcutaneous, Skin: Dermis , Skin: Epidermis, Slough Level: Skin/Subcutaneous Tissue Debridement Description: Excisional Instrument: Curette Bleeding: Minimum Hemostasis Achieved: Pressure Procedural Pain: 0 Post Procedural Pain: 0 Response to Treatment: Procedure was tolerated well Level of Consciousness (Post- Awake and Alert procedure): Post  Debridement Measurements of Total Wound Length: (cm) 5 Width: (cm) 5.8 Depth: (cm) 0.6 Volume: (cm) 13.666 Character of Wound/Ulcer Post Debridement: Requires Further Debridement Severity of Tissue Post Debridement: Fat layer exposed Post Procedure Diagnosis Same as Pre-procedure Notes Scribed for Dr. Celine [name] by [name] East, RN Electronic Signature(s) Signed: 09/20/2022 2:36:33 PM By: [name] Maudlin MD FACS Entered By: [name] on 09/20/2022 14:36:33 HPI Details -------------------------------------------------------------------------------- [name] ([id number]) [id number] Page 3 of 12 Patient Name: Date of Service: [name], [name]. 09/20/2022 1:00 PM Medical Record Number: [id number] Patient Account Number: [id number] Date of Birth/Sex:  Treating RN: [birth year] (66 y.o. M) Primary Care Provider: Nelda [name] Other Clinician: Referring Provider: Treating Provider/Extender: [name] in Treatment: 1 History of Present Illness HPI Description: ADMISSION 09/10/2022 This is a 66 year old poorly controlled type II diabetic (last hemoglobin A1c 10.8%) who has had an ulcer on his heel for over 3 years. He has been seen in multiple wound care centers, including [facility] and [facility]. He reports that at least 3 doctors have recommended that he undergo below-knee amputation. He most recently met with Dr. Catalina [name], a vascular surgeon affiliated with [facility]. 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 Unfortunately, the patient has some extenuating social circumstances including the fact that he cares for his wife who has stage IV colon cancer and still works, driving vehicles for [employer]. 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 He was recently hospitalized at [facility] for sepsis  and currently has a PICC line through which he receives IV antibiotics. He reports having had another MRI during that hospital stay along with a chest x-ray and EKG. He apparently contacted one of the hyperbaric therapy techs here and asked a number of questions about hyperbaric oxygen treatments. He subsequently self-referred to our center to undergo further evaluation and management. I mention to him that [facility] actually has hyperbaric chambers, but he states that he lives in [place] and this would be more convenient for him given the intensive nature of the therapy and time requirement. ABI in clinic today was 0.94. The patient actually has 2 wounds. There is a wound on the dorsum of his left foot with heavy black eschar and slough present. After debridement, this was demonstrated to involve the muscle and the extensor tendons are exposed. On his heel, there is essentially a "shark bite" type wound, with much of the heel fat pad absent. The muscle layer is exposed. There is blue-green staining around the perimeter of the wound, but no significant odor. He says he has been applying collagen to the wound on his heel and Silvadene and Betadine to the wound on his dorsal foot. 09/20/2022: The heel wound is quite macerated with wet periwound callus. There is slough accumulation on the surface. The dorsal foot wound looks better this week. There is still exposed tendon, but it is fairly clean with just a little biofilm buildup. We are still working on gathering the required documentation to submit for pretreatment review for hyperbaric oxygen therapy. Electronic Signature(s) Signed: 09/20/2022 2:33:29 PM By: [name] Maudlin MD FACS Entered By: [name] on 09/20/2022 14:33:29 -------------------------------------------------------------------------------- Physical Exam Details Patient Name: Date of Service: [place], [name]. 09/20/2022 1:00 PM Medical Record Number: [id number] Patient  Account Number: [id number] Date of Birth/Sex: Treating RN: 01-Apr-1956 (66 y.o. M) Primary Care Provider: Nelda [name] Other Clinician: Referring Provider: Treating Provider/Extender: [name] in Treatment: 1 Constitutional Hypertensive, asymptomatic. . . . No acute distress.Marland Kitchen Respiratory Normal work of breathing on room air.. Notes 09/20/2022: The heel wound is quite macerated with wet periwound callus. There is slough accumulation on the surface. The dorsal foot wound looks better this week. There is still exposed tendon, but it is fairly clean with just a little biofilm buildup. Electronic Signature(s) Signed: 09/20/2022 2:34:00 PM By: [name] Maudlin MD FACS Entered By: [name] on 09/20/2022 14:33:59 Physician Orders Details -------------------------------------------------------------------------------- [name] ([id number]) [id number] Page 4 of  12 Patient Name: Date of Service: [place], [name]. 09/20/2022 1:00 PM Medical Record Number: [id number] Patient Account Number: [id number] Date of Birth/Sex: Treating RN: [birth year] (66 y.o. 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 - Dr. Celine [name] Rm 4 Return A Bathing/ Shower/ Hygiene May shower with protection but do not get wound dressing(s) wet. Edema Control - Lymphedema / SCD /  Other Left Lower Extremity Elevate legs to the level of the heart or above for 30 minutes daily and/or when sitting, a frequency of: Avoid standing for long periods of time. Hyperbaric Oxygen Therapy Wound #1 Left Calcaneus Evaluate for HBO Therapy Indication: - chronic refractory osteomyelitis If appropriate for treatment, begin HBOT per protocol: 2.5 ATA for 90 Minutes with 2 Five (5) Minute A Breaks ir Total Number of Treatments: - 40 One treatments per day (delivered Monday through Friday unless otherwise specified in Special Instructions below): Finger stick Blood Glucose Pre- and Post- HBOT Treatment. Follow Hyperbaric Oxygen Glycemia Protocol A frin (Oxymetazoline HCL) 0.05% nasal spray - 1 spray in both nostrils daily as needed prior to HBO treatment for difficulty clearing ears Wound #2 Left,Dorsal Foot Evaluate for HBO Therapy If appropriate for treatment, begin HBOT per protocol: Wound Treatment Wound #1 - Calcaneus Wound Laterality: Left Cleanser: Soap and Water 1 x Per Day/30 Days Discharge Instructions: May shower and wash wound with dial antibacterial soap and water prior to dressing change. Peri-Wound Care: Sween Lotion (Moisturizing lotion) 1 x Per Day/30 Days Discharge Instructions: Apply moisturizing lotion as directed Topical: Gentamicin 1 x Per Day/30 Days Discharge Instructions: As directed by physician Prim Dressing: KerraCel Ag Gelling Fiber Dressing, 4x5 in (silver alginate) (Generic) 1 x Per Day/30 Days ary Discharge Instructions: Apply silver alginate to wound bed as instructed Secondary Dressing: ABD Pad, 5x9 (Generic) 1 x Per Day/30 Days Discharge Instructions: Apply over primary dressing as directed. Secondary Dressing: Zetuvit Plus Silicone Border Dressing 5x5 (in/in) 1 x Per Day/30 Days Discharge Instructions: Apply silicone border over primary dressing as directed. Secured With: 74M Medipore Public affairs consultant Surgical T 2x10 (in/yd) 1 x Per Day/30  Days ape Discharge Instructions: Secure with tape as directed. Compression Wrap: Kerlix Roll 4.5x3.1 (in/yd) (Generic) 1 x Per Day/30 Days Discharge Instructions: Apply Kerlix and Coban compression as directed. Compression Wrap: Coban Self-Adherent Wrap 4x5 (in/yd) (Generic) 1 x Per Day/30 Days Discharge Instructions: Apply over Kerlix as directed. Wound #2 - Foot Wound Laterality: Dorsal, Left Cleanser: Soap and Water 1 x Per Day/30 Days [name], [name] ([id number]) [id number].pdf Page 5 of 12 Discharge Instructions: May shower and wash wound with dial antibacterial soap and water prior to dressing change. Peri-Wound Care: Sween Lotion (Moisturizing lotion) 1 x Per Day/30 Days Discharge Instructions: Apply moisturizing lotion as directed Topical: Gentamicin 1 x Per Day/30 Days Discharge Instructions: As directed by physician Prim Dressing: KerraCel Ag Gelling Fiber Dressing, 4x5 in (silver alginate) (Generic) 1 x Per Day/30 Days ary Discharge Instructions: Apply silver alginate to wound bed as instructed Secondary Dressing: ALLEVYN Heel 4  1/2in x 5 1/2in / 10.5cm x 13.5cm (Generic) 1 x Per Day/30 Days Discharge Instructions: Apply over primary dressing as directed. Secondary Dressing: Zetuvit Plus Silicone Border Dressing 5x5 (in/in) (Generic) 1 x Per Day/30 Days Discharge Instructions: Apply silicone border over primary dressing as directed. Secured With: 62M Medipore Public affairs consultant Surgical T 2x10 (in/yd) 1 x Per Day/30 Days ape Discharge Instructions: Secure with tape as directed. Compression Wrap: Kerlix Roll 4.5x3.1 (in/yd) (Generic) 1 x Per Day/30 Days Discharge Instructions: Apply Kerlix and Coban compression as directed. Compression Wrap: Coban Self-Adherent Wrap 4x5 (in/yd) (Generic) 1 x Per Day/30 Days Discharge Instructions: Apply over Kerlix as directed. GLYCEMIA INTERVENTIONS PROTOCOL PRE-HBO GLYCEMIA INTERVENTIONS ACTION INTERVENTION Obtain pre-HBO  capillary blood glucose (ensure 1 physician order is in chart). A. Notify HBO physician and await physician orders. 2 If result is 70 mg/dl or below: B. If the result meets the hospital definition of a critical result, follow hospital policy. A. Give patient an 8 ounce Glucerna Shake, an 8 ounce Ensure, or 8 ounces of a Glucerna/Ensure equivalent dietary supplement*. B. Wait 30 minutes. If result is 71 mg/dl to 130 mg/dl: C. Retest patients capillary blood glucose (CBG). D. If result greater than or equal to 110 mg/dl, proceed with HBO. If result less than 110 mg/dl, notify HBO physician and consider holding HBO. If result is 131 mg/dl to 249 mg/dl: A. Proceed with HBO. A. Notify HBO physician and await physician orders. B. It is recommended to hold HBO and do If result is 250 mg/dl or greater: blood/urine ketone testing. C. If the result meets the hospital definition of a critical result, follow hospital policy. POST-HBO GLYCEMIA INTERVENTIONS ACTION INTERVENTION Obtain post HBO capillary blood glucose (ensure 1 physician order is in chart). A. Notify HBO physician and await physician orders. 2 If result is 70 mg/dl or below: B. If the result meets the hospital definition of a critical result, follow hospital policy. A. Give patient an 8 ounce Glucerna Shake, an 8 ounce Ensure, or 8 ounces of a Glucerna/Ensure equivalent dietary supplement*. B. Wait 15 minutes for symptoms of If result is 71 mg/dl to 100 mg/dl: hypoglycemia (i.e. nervousness, anxiety, sweating, chills, clamminess, irritability, confusion, tachycardia or dizziness). C. If patient asymptomatic, discharge patient. If patient symptomatic, repeat capillary blood glucose (CBG) and notify HBO physician. If result is 101 mg/dl to 249 mg/dl: A. Discharge patient. A. Notify HBO physician and await physician orders. B. It is recommended to do blood/urine ketone If result is 250 mg/dl or  greater: testing. C. If the result meets the hospital definition of a critical result, follow hospital policy. *Juice or candies are NOT equivalent products. If patient refuses the Glucerna or Ensure, please consult the hospital dietitian for an appropriate substitute. [name], [name] ([id number]) [id number] Page 6 of 12 Electronic Signature(s) Signed: 09/23/2022 11:49:46 AM By: [name] Maudlin MD FACS Previous Signature: 09/20/2022 2:48:59 PM Version By: [name] Maudlin MD FACS Entered By: [name] on 09/23/2022 11:47:26 -------------------------------------------------------------------------------- Problem List Details Patient Name: Date of Service: [name], [name]. 09/20/2022 1:00 PM Medical Record Number: [id number] Patient Account Number: [id number] Date of Birth/Sex: Treating RN: 07/01/1956 (66 y.o. 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 09/20/2022 1:00 PM Medical Record Number: [id number] Patient Account Number:  [id number] Date of Birth/Sex: Treating RN: Aug 26, 1956 (66 y.o. M) Primary Care Provider: Nelda [name] Other Clinician: Referring Provider: Treating Provider/Extender: [name] in Treatment: [place], [place] ([id number]) [id number] Page 7 of 12 Chief Complaint Information obtained from Patient Patients presents for treatment of an open diabetic ulcer and evaluation for hyperbaric oxygen therapy History of Present Illness (HPI) ADMISSION 09/10/2022 This is a 66 year old poorly controlled type II diabetic (last hemoglobin A1c 10.8%) who has had an ulcer on his heel for over 3 years. He has been seen in multiple wound care centers, including [facility] and [facility]. He reports that at least 3 doctors have recommended that he undergo below-knee amputation. He most recently met with Dr. Catalina [name], a vascular surgeon affiliated with [facility]. 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 Unfortunately, the patient has some extenuating social circumstances including the fact that he cares for his wife who has stage IV colon cancer and still works, driving vehicles for [employer]. 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 He was recently hospitalized at [facility] for sepsis and currently has a PICC line through which he receives IV antibiotics. He reports having had another MRI during that hospital stay along with a chest x-ray and EKG. He apparently contacted one of the hyperbaric therapy techs here and asked a number of questions about hyperbaric oxygen treatments. He subsequently self-referred to our center to undergo further evaluation and management. I mention to him that [facility] actually has hyperbaric chambers, but he states that he lives in [place] and this would be more convenient for him  given the intensive nature of the therapy and time requirement. ABI in clinic today was 0.94. The patient actually has 2 wounds. There is a wound on the dorsum of his left foot with heavy black eschar and slough present. After debridement, this was demonstrated to involve the muscle and the extensor tendons are exposed. On his heel, there is essentially a "shark bite" type wound, with much of the heel fat pad absent. The muscle layer is exposed. There is blue-green staining around the perimeter of the wound, but no significant odor. He says he has been applying collagen to the wound on his heel and Silvadene and Betadine to the wound on his dorsal foot. 09/20/2022: The heel wound is quite macerated with wet periwound callus. There is slough accumulation on the surface. The dorsal foot wound looks better this week. There is still exposed tendon, but it is fairly clean with just a little biofilm buildup. We are still working on gathering the required documentation to submit for pretreatment review for hyperbaric oxygen therapy. Patient History Family History Cancer - Father,Mother, Diabetes - Mother,Father, Lung Disease - Father, Thyroid Problems - Mother, No family history of Heart Disease, Hereditary Spherocytosis, Hypertension, Kidney Disease, Seizures, Stroke, Tuberculosis. Social History Never smoker, Marital Status - Married, Alcohol Use -  Never, Drug Use - No History, Caffeine Use - Daily. Medical History Eyes Patient has history of Cataracts Denies history of Glaucoma, Optic Neuritis Ear/Nose/Mouth/Throat Denies history of Chronic sinus problems/congestion, Middle ear problems Respiratory Patient has history of Sleep Apnea Cardiovascular Patient has history of Hypertension Gastrointestinal Denies history of Cirrhosis , Colitis, Crohnoos, Hepatitis A, Hepatitis B, Hepatitis C Endocrine Patient has history of Type II Diabetes Immunological Denies history of Lupus Erythematosus,  Raynaudoos, Scleroderma Musculoskeletal Patient has history of Osteomyelitis - 2023 Neurologic Patient has history of Neuropathy - Bila lower extremities Oncologic Denies history of Received Chemotherapy, Received Radiation Psychiatric Denies history of Anorexia/bulimia, Confinement Anxiety Hospitalization/Surgery History - I and D Left calcaneus. - back surgery- laminectomy. - eye surgery- Bila cataracts. - shoulder arthroscopy. Medical A Surgical History Notes nd Cardiovascular hyperlipidemia Genitourinary AKI Objective Constitutional Hypertensive, asymptomatic. No acute distress.. Vitals Time Taken: 1:11 PM, Height: 74 in, Weight: 245 lbs, BMI: 31.5, Temperature: 97.8 F, Pulse: 91 bpm, Respiratory Rate: 18 breaths/min, Blood Pressure: 168/90 mmHg. Respiratory Normal work of breathing on room air.Marland Kitchen [name], [name] ([id number]) [id number] Page 8 of 12 General Notes: 09/20/2022: The heel wound is quite macerated with wet periwound callus. There is slough accumulation on the surface. The dorsal foot wound looks better this week. There is still exposed tendon, but it is fairly clean with just a little biofilm buildup. Integumentary (Hair, Skin) Wound #1 status is Open. Original cause of wound was Pressure Injury. The date acquired was: 03/09/2019. The wound has been in treatment 1 weeks. The wound is located on the Left Calcaneus. The wound measures 5cm length x 5.8cm width x 0.6cm depth; 22.777cm^2 area and 13.666cm^3 volume. There is Fat Layer (Subcutaneous Tissue) exposed. There is no tunneling or undermining noted. There is a medium amount of serosanguineous drainage noted. The wound margin is fibrotic, thickened scar. There is medium (34-66%) red, pink granulation within the wound bed. There is a medium (34-66%) amount of necrotic tissue within the wound bed including Eschar and Adherent Slough. The periwound skin appearance exhibited: Callus,  Maceration. The periwound skin appearance did not exhibit: Dry/Scaly. Wound #2 status is Open. Original cause of wound was Pressure Injury. The date acquired was: 07/09/2022. The wound has been in treatment 1 weeks. The wound is located on the Left,Dorsal Foot. The wound measures 4cm length x 2.8cm width x 0.1cm depth; 8.796cm^2 area and 0.88cm^3 volume. There is Fat Layer (Subcutaneous Tissue) exposed. There is no tunneling or undermining noted. There is a medium amount of serosanguineous drainage noted. There is medium (34-66%) pink granulation within the wound bed. There is a medium (34-66%) amount of necrotic tissue within the wound bed including Eschar and Adherent Slough. 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 There was a Excisional Skin/Subcutaneous Tissue Debridement with a total area of 29 sq cm performed by [name] Maudlin, MD. With the following instrument(s): Curette to remove Viable and Non-Viable tissue/material. Material removed includes Callus, Subcutaneous Tissue, Slough, Skin: Dermis, and Skin: Epidermis. No specimens were taken. A time out was conducted at 13:31, prior to the start  of the procedure. A Minimum amount of bleeding was controlled with Pressure. The procedure was tolerated well with a pain level of 0 throughout and a pain level of 0 following the procedure. Post Debridement Measurements: 5cm length x 5.8cm width x 0.6cm depth; 13.666cm^3 volume. Character of Wound/Ulcer Post Debridement requires  further debridement. Severity of Tissue Post Debridement is: Fat layer exposed. Post procedure Diagnosis Wound #1: Same as Pre-Procedure General Notes: Scribed for Dr. Celine [name] by [name] East, RN. 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 There was a Excisional Skin/Subcutaneous Tissue Debridement with a total area of 11.2 sq cm performed by [name] Maudlin, MD. With the following instrument(s): Curette to remove Viable and Non-Viable tissue/material. Material removed includes Subcutaneous Tissue and Slough and. No specimens were taken. A time out was conducted at 13:31, prior to the start of the procedure. A Minimum amount of bleeding was controlled with Pressure. The procedure was tolerated well with a pain level of 0 throughout and a pain level of 0 following the procedure. Post Debridement Measurements: 4cm length x 2.8cm width x 0.1cm depth; 0.88cm^3 volume. Character of Wound/Ulcer Post Debridement requires further debridement. Severity of Tissue Post Debridement is: Fat layer exposed. Post procedure Diagnosis Wound #2: Same as Pre-Procedure General Notes: Scribed for Dr. Celine [name] by [name] East, RN. Plan Follow-up Appointments: Return Appointment in 1 week. - Dr. Celine [name] Rm 4 Bathing/ Shower/ Hygiene: May shower with protection but do not get wound dressing(s) wet. Edema Control - Lymphedema / SCD / Other: Elevate legs to the level of the heart or above for 30 minutes daily and/or when sitting, a frequency of: Avoid standing for long periods of time. Hyperbaric Oxygen Therapy: Wound #1 Left Calcaneus: Evaluate for HBO Therapy Indication: - chronic refractory osteomyelitis If appropriate for treatment, begin HBOT per protocol: 2.5 ATA for 90 Minutes with 2 Five (5) Minute Air Breaks T Number of Treatments: - 40 otal One treatments per day  (delivered Monday through Friday unless otherwise specified in Special Instructions below): Finger stick Blood Glucose Pre- and Post- HBOT Treatment. Follow Hyperbaric Oxygen Glycemia Protocol Afrin (Oxymetazoline HCL) 0.05% nasal spray - 1 spray in both nostrils daily as needed prior to HBO treatment for difficulty clearing ears Wound #2 Left,Dorsal Foot: Evaluate for HBO Therapy [name], [name] ([id number]) [id number] Page 9 of 12 If appropriate for treatment, begin HBOT per protocol: WOUND #1: - Calcaneus Wound Laterality: Left Cleanser: Soap and Water 1 x Per Day/30 Days Discharge Instructions: May shower and wash wound with dial antibacterial soap and water prior to dressing change. Peri-Wound Care: Sween Lotion (Moisturizing lotion) 1 x Per Day/30 Days Discharge Instructions: Apply moisturizing lotion as directed Topical: Gentamicin 1 x Per Day/30 Days Discharge Instructions: As directed by physician Prim Dressing: KerraCel Ag Gelling Fiber Dressing, 4x5 in (silver alginate) (Generic) 1 x Per Day/30 Days ary Discharge Instructions: Apply silver alginate to wound bed as instructed Secondary Dressing: ABD Pad, 5x9 (Generic) 1 x Per Day/30 Days Discharge Instructions: Apply over primary dressing as directed. Secondary Dressing: Zetuvit Plus Silicone Border Dressing 5x5 (in/in) 1 x Per Day/30 Days Discharge Instructions: Apply silicone border over primary dressing as directed. Secured With: 71M Medipore Public affairs consultant Surgical T 2x10 (in/yd) 1 x Per Day/30 Days ape Discharge Instructions: Secure with tape as directed. Com pression Wrap: Kerlix Roll 4.5x3.1 (in/yd) (Generic) 1 x Per Day/30 Days  Discharge Instructions: Apply Kerlix and Coban compression as directed. Com pression Wrap: Coban Self-Adherent Wrap 4x5 (in/yd) (Generic) 1 x Per Day/30 Days Discharge Instructions: Apply over Kerlix as directed. WOUND #2: - Foot Wound Laterality: Dorsal, Left Cleanser:  Soap and Water 1 x Per Day/30 Days Discharge Instructions: May shower and wash wound with dial antibacterial soap and water prior to dressing change. Peri-Wound Care: Sween Lotion (Moisturizing lotion) 1 x Per Day/30 Days Discharge Instructions: Apply moisturizing lotion as directed Topical: Gentamicin 1 x Per Day/30 Days Discharge Instructions: As directed by physician Prim Dressing: KerraCel Ag Gelling Fiber Dressing, 4x5 in (silver alginate) (Generic) 1 x Per Day/30 Days ary Discharge Instructions: Apply silver alginate to wound bed as instructed Secondary Dressing: ALLEVYN Heel 4 1/2in x 5 1/2in / 10.5cm x 13.5cm (Generic) 1 x Per Day/30 Days Discharge Instructions: Apply over primary dressing as directed. Secondary Dressing: Zetuvit Plus Silicone Border Dressing 5x5 (in/in) (Generic) 1 x Per Day/30 Days Discharge Instructions: Apply silicone border over primary dressing as directed. Secured With: 78M Medipore Public affairs consultant Surgical T 2x10 (in/yd) 1 x Per Day/30 Days ape Discharge Instructions: Secure with tape as directed. Com pression Wrap: Kerlix Roll 4.5x3.1 (in/yd) (Generic) 1 x Per Day/30 Days Discharge Instructions: Apply Kerlix and Coban compression as directed. Com pression Wrap: Coban Self-Adherent Wrap 4x5 (in/yd) (Generic) 1 x Per Day/30 Days Discharge Instructions: Apply over Kerlix as directed. 09/20/2022: The heel wound is quite macerated with wet periwound callus. There is slough accumulation on the surface. The dorsal foot wound looks better this week. There is still exposed tendon, but it is fairly clean with just a little biofilm buildup. I used a curette to debride skin, slough, callus, and nonviable subcutaneous tissue from his wounds. We will continue to use topical gentamicin with silver alginate to both wounds. Kerlix and Coban wrap. We will try to get all of his data together to submit for hyperbaric oxygen therapy. Follow-up with me in 1 week. Electronic  Signature(s) Signed: 09/23/2022 11:47:36 AM By: [name] Maudlin MD FACS Previous Signature: 09/20/2022 2:36:51 PM Version By: [name] Maudlin MD FACS Previous Signature: 09/20/2022 2:35:39 PM Version By: [name] Maudlin MD FACS Entered By: [name] on 09/23/2022 11:47:36 -------------------------------------------------------------------------------- HxROS Details Patient Name: Date of Service: [name], [name]. 09/20/2022 1:00 PM Medical Record Number: [id number] Patient Account Number: [id number] Date of Birth/Sex: Treating RN: 1956/09/04 (66 y.o. M) Primary Care Provider: Nelda [name] Other Clinician: Referring Provider: Treating Provider/Extender: [name] in Treatment: 1 Eyes Medical History: Positive for: Cataracts Negative for: Glaucoma; Optic Neuritis [name], [name] ([id number]) [id number] Page 10 of 12 Ear/Nose/Mouth/Throat Medical History: Negative for: Chronic sinus problems/congestion; Middle ear problems Respiratory Medical History: Positive for: Sleep Apnea Cardiovascular Medical History: Positive for: Hypertension Past Medical History Notes: hyperlipidemia Gastrointestinal Medical History: Negative for: Cirrhosis ; Colitis; Crohns; Hepatitis A; Hepatitis B; Hepatitis C Endocrine Medical History: Positive for: Type II Diabetes Time with diabetes: 20 years Treated with: Insulin Blood sugar tested every day: Yes Tested : 2-3 Genitourinary Medical History: Past Medical History Notes: AKI Immunological Medical History: Negative for: Lupus Erythematosus; Raynauds; Scleroderma Musculoskeletal Medical History: Positive for: Osteomyelitis - 2023 Neurologic Medical History: Positive for: Neuropathy - Bila lower extremities Oncologic Medical History: Negative for: Received Chemotherapy; Received Radiation Psychiatric Medical History: Negative for: Anorexia/bulimia; Confinement  Anxiety HBO Extended History Items Eyes: Cataracts Immunizations Pneumococcal Vaccine: Received Pneumococcal Vaccination: Yes Received Pneumococcal Vaccination On or After 60th Birthday: Yes Implantable Devices Yes Hospitalization /  Surgery History Type of Hospitalization/Surgery I and D Left calcaneus [name], KAHRS ([id number]) [id number].pdf Page 11 of 12 back surgery- laminectomy eye surgery- Bila cataracts shoulder arthroscopy Family and Social History Cancer: Yes - Father,Mother; Diabetes: Yes - Mother,Father; Heart Disease: No; Hereditary Spherocytosis: No; Hypertension: No; Kidney Disease: No; Lung Disease: Yes - Father; Seizures: No; Stroke: No; Thyroid Problems: Yes - Mother; Tuberculosis: No; Never smoker; Marital Status - Married; Alcohol Use: Never; Drug Use: No History; Caffeine Use: Daily; Financial Concerns: No; Food, Clothing or Shelter Needs: No; Support System Lacking: No; Transportation Concerns: No Electronic Signature(s) Signed: 09/20/2022 2:48:59 PM By: [name] Maudlin MD FACS Entered By: [name] on 09/20/2022 14:33:34 -------------------------------------------------------------------------------- SuperBill Details Patient Name: Date of Service: [name], [name]. 09/20/2022 Medical Record Number: [id number] Patient Account Number: [id number] Date of Birth/Sex: Treating RN: May 19, 1956 (66 y.o. 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.523 Non-pressure chronic ulcer of other part of left foot with necrosis of  muscle Facility Procedures : CPT4 Code: [id number] Description: 28638 - DEB SUBQ TISSUE 20 SQ CM/< ICD-10 Diagnosis Description 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 Modifier: Quantity: 1 : CPT4 Code: [id number] Description: 90383 - DEB SUBQ TISS EA ADDL 20CM ICD-10 Diagnosis Description 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 Modifier: Quantity: 2 Physician Procedures : CPT4 Code Description Modifier [id number] 91660 - WC PHYS LEVEL 4 - EST PT 25 ICD-10 Diagnosis Description 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 E11.621 Type 2 diabetes mellitus with foot ulcer 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, left ankle and foot Quantity: 1 : [id number] 77414 - WC PHYS SUBQ TISS 20 SQ CM ICD-10 Diagnosis Description 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

## 2022-09-24 NOTE — Progress Notes (Signed)
[name], [name] ([id number]) [id number] Page 1 of 10 Visit Report for 09/20/2022 Arrival Information Details Patient Name: Date of Service: [name], [name]. 09/20/2022 1:00 PM Medical Record Number: [id number] Patient Account Number: [id number] Date of Birth/Sex: Treating RN: 05-13-1956 (66 y.o. [name]) Zochol, Jamie Primary Care Crystian Frith: Foye DeerSchultz, Douglas Other Clinician: Referring Tra Wilemon: Treating Diron Haddon/Extender: Kandis Cockingannon, Jennifer Schultz, Douglas Weeks in Treatment: 1 Visit Information History Since Last Visit All ordered tests and consults were completed: Yes Patient Arrived: Ambulatory Added or deleted any medications: No Arrival Time: 13:11 Any new allergies or adverse reactions: No Accompanied By: self Had a fall or experienced change in No Transfer Assistance: None activities of daily living that may affect Patient Requires Transmission-Based Precautions: No risk of falls: Patient Has Alerts: No Signs or symptoms of abuse/neglect since last visito No Hospitalized since last visit: No Implantable device outside of the clinic excluding No cellular tissue based products placed in the center since last visit: Has Dressing in Place as Prescribed: Yes Pain Present Now: No Electronic Signature(s) Signed: 09/24/2022 5:15:19 PM By: Tommie ArdZochol, Jamie RN Entered By: Tommie ArdZochol, Jamie on 09/20/2022 13:11:26 -------------------------------------------------------------------------------- Encounter Discharge Information Details Patient Name: Date of Service: [name], [name] 09/20/2022 1:00 PM Medical Record Number: [id number] Patient Account Number: [id number] Date of Birth/Sex: Treating RN: 05-13-1956 (66 y.o. [name]) Zochol, Jamie Primary Care Alexee Delsanto: Foye DeerSchultz, Douglas Other Clinician: Referring Trenita Hulme: Treating Marsel Gail/Extender: Kandis Cockingannon, Jennifer Schultz, Douglas Weeks in Treatment: 1 Encounter Discharge Information Items Post Procedure Vitals Discharge  Condition: Stable Temperature (F): 97.8 Ambulatory Status: Ambulatory Pulse (bpm): 91 Discharge Destination: Home Respiratory Rate (breaths/min): 18 Transportation: Private Auto Blood Pressure (mmHg): 168/90 Accompanied By: self Schedule Follow-up Appointment: Yes Clinical Summary of Care: Electronic Signature(s) Signed: 09/24/2022 5:15:19 PM By: Tommie ArdZochol, Jamie RN Entered By: Tommie ArdZochol, Jamie on 09/20/2022 13:55:18 [name], [name] ([id number]) [id number] Page 2 of 10 -------------------------------------------------------------------------------- Lower Extremity Assessment Details Patient Name: Date of Service: [name], RO [place] [name]. 09/20/2022 1:00 PM Medical Record Number: [id number] Patient Account Number: [id number] Date of Birth/Sex: Treating RN: 05-13-1956 (66 y.o. [name]) Zochol, Jamie Primary Care Deron Poole: Foye DeerSchultz, Douglas Other Clinician: Referring Apryl Brymer: Treating Alexy Heldt/Extender: Kandis Cockingannon, Jennifer Schultz, Douglas Weeks in Treatment: 1 Edema Assessment Assessed: Kyra Searles[Left: No] [name][Right: No] [Left: Edema] [Right: :] Calf Left: Right: Point of Measurement: From Medial Instep 45 cm Ankle Left: Right: Point of Measurement: From Medial Instep 25 cm Vascular Assessment Pulses: Dorsalis Pedis Palpable: [Left:Yes] Electronic Signature(s) Signed: 09/24/2022 5:15:19 PM By: Tommie ArdZochol, Jamie RN Entered By: Tommie ArdZochol, Jamie on 09/20/2022 13:15:09 -------------------------------------------------------------------------------- Multi Wound Chart Details Patient Name: Date of Service: [name], [name] 09/20/2022 1:00 PM Medical Record Number: [id number] Patient Account Number: [id number] Date of Birth/Sex: Treating RN: 05-13-1956 (66 y.o. M) Primary Care Flo Berroa: Foye DeerSchultz, Douglas Other Clinician: Referring Shadeed Colberg: Treating Reginold Beale/Extender: Kandis Cockingannon, Jennifer Schultz, Douglas Weeks in Treatment: 1 Vital Signs Height(in): 74 Pulse(bpm): 91 Weight(lbs):  245 Blood Pressure(mmHg): 168/90 Body Mass Index(BMI): 31.5 Temperature(F): 97.8 Respiratory Rate(breaths/min): 18 [1:Photos:] [N/A:N/A [id number] Page 3 of 10] Left Calcaneus Left, Dorsal Foot N/A Wound Location: Pressure Injury Pressure Injury N/A Wounding Event: Diabetic Wound/Ulcer of the Lower Diabetic Wound/Ulcer of the Lower N/A Primary Etiology: Extremity Extremity Cataracts, Sleep Apnea, Hypertension, Cataracts, Sleep Apnea, Hypertension, N/A Comorbid History: Type II Diabetes, Osteomyelitis, Type II Diabetes, Osteomyelitis, Neuropathy Neuropathy 03/09/2019 07/09/2022 N/A Date Acquired: 1 1 N/A Weeks of Treatment: Open Open N/A Wound Status: No No N/A Wound Recurrence: 5x5.8x0.6 4x2.8x0.1 N/A Measurements L x W x D (cm) 22.777 8.796  N/A A (cm) : rea 13.666 0.88 N/A Volume (cm) : -5.50% 6.70% N/A % Reduction in A rea: 36.70% 6.60% N/A % Reduction in Volume: Grade 3 Grade 2 N/A Classification: Medium Medium N/A Exudate A mount: Serosanguineous Serosanguineous N/A Exudate Type: red, brown red, brown N/A Exudate Color: Fibrotic scar, thickened scar N/A N/A Wound Margin: Medium (34-66%) Medium (34-66%) N/A Granulation A mount: Red, Pink Pink N/A Granulation Quality: Medium (34-66%) Medium (34-66%) N/A Necrotic A mount: Eschar, Adherent Slough Eschar, Adherent Slough N/A Necrotic Tissue: Fat Layer (Subcutaneous Tissue): Yes Fat Layer (Subcutaneous Tissue): Yes N/A Exposed Structures: Fascia: No Fascia: No Tendon: No Tendon: No Muscle: No Muscle: No Joint: No Joint: No Bone: No Bone: No Small (1-33%) Small (1-33%) N/A Epithelialization: Debridement - Excisional Debridement - Excisional N/A Debridement: Pre-procedure Verification/Time Out 13:31 13:31 N/A Taken: Cartilage, Subcutaneous, Slough Subcutaneous, Slough N/A Tissue Debrided: Skin/Subcutaneous Skin/Subcutaneous Tissue N/A Level: Tissue/Muscle/Bone 29  11.2 N/A Debridement A (sq cm): rea Curette Curette N/A Instrument: Minimum Minimum N/A Bleeding: Pressure Pressure N/A Hemostasis Achieved: 0 0 N/A Procedural Pain: 0 0 N/A Post Procedural Pain: Debridement Treatment Response: Procedure was tolerated well Procedure was tolerated well N/A Post Debridement Measurements L x 5x5.8x0.6 4x2.8x0.1 N/A W x D (cm) 13.666 0.88 N/A Post Debridement Volume: (cm) Callus: Yes N/A Periwound Skin Texture: Maceration: Yes N/A Periwound Skin Moisture: Dry/Scaly: No Debridement Debridement N/A Procedures Performed: Treatment Notes Wound #1 (Calcaneus) Wound Laterality: Left Cleanser Soap and Water Discharge Instruction: May shower and wash wound with dial antibacterial soap and water prior to dressing change. Peri-Wound Care Sween Lotion (Moisturizing lotion) Discharge Instruction: Apply moisturizing lotion as directed Topical Gentamicin Discharge Instruction: As directed by physician Primary Dressing KerraCel Ag Gelling Fiber Dressing, 4x5 in (silver alginate) Discharge Instruction: Apply silver alginate to wound bed as instructed Secondary Dressing ABD Pad, 5x9 Discharge Instruction: Apply over primary dressing as directed. Zetuvit Plus Silicone Border Dressing 5x5 (in/in) Discharge Instruction: Apply silicone border over primary dressing as directed. Secured With [employer] ([id number]) [id number] Page 4 of 10 11M Medipore Soft Cloth Surgical T 2x10 (in/yd) ape Discharge Instruction: Secure with tape as directed. Compression Wrap Kerlix Roll 4.5x3.1 (in/yd) Discharge Instruction: Apply Kerlix and Coban compression as directed. Coban Self-Adherent Wrap 4x5 (in/yd) Discharge Instruction: Apply over Kerlix as directed. Compression Stockings Add-Ons Wound #2 (Foot) Wound Laterality: Dorsal, Left Cleanser Soap and Water Discharge Instruction: May shower and wash wound with dial antibacterial soap  and water prior to dressing change. Peri-Wound Care Sween Lotion (Moisturizing lotion) Discharge Instruction: Apply moisturizing lotion as directed Topical Gentamicin Discharge Instruction: As directed by physician Primary Dressing KerraCel Ag Gelling Fiber Dressing, 4x5 in (silver alginate) Discharge Instruction: Apply silver alginate to wound bed as instructed Secondary Dressing ALLEVYN Heel 4 1/2in x 5 1/2in / 10.5cm x 13.5cm Discharge Instruction: Apply over primary dressing as directed. Zetuvit Plus Silicone Border Dressing 5x5 (in/in) Discharge Instruction: Apply silicone border over primary dressing as directed. Secured With [employer] T 2x10 (in/yd) ape Discharge Instruction: Secure with tape as directed. Compression Wrap Kerlix Roll 4.5x3.1 (in/yd) Discharge Instruction: Apply Kerlix and Coban compression as directed. Coban Self-Adherent Wrap 4x5 (in/yd) Discharge Instruction: Apply over Kerlix as directed. Compression Stockings Add-Ons Electronic Signature(s) Signed: 09/20/2022 2:31:59 PM By: [name] MD FACS Entered By: [name] on 09/20/2022 14:31:59 -------------------------------------------------------------------------------- Multi-Disciplinary Care Plan Details Patient Name: Date of Service: [name], [name]. 09/20/2022 1:00 PM Medical Record Number: [id number] Patient Account Number: [id number] Date  of Birth/Sex: Treating RN: 1956-10-16 (66 y.o. [name] Cava Primary Care Gizella [place]: Foye [name] Other Clinician: Referring Malanie Koloski: Treating Melquiades Kovar/Extender: Kandis Cocking in Treatment: 1 [place], [place] [name] ([id number]) [id number] Page 5 of 10 Active Inactive Orientation to the Wound Care Program Nursing Diagnoses: Knowledge deficit related to the wound healing center program Goals: Patient/caregiver will verbalize understanding of the Wound Healing Center  Program Date Initiated: 09/10/2022 Target Resolution Date: 10/18/2022 Goal Status: Active Interventions: Provide education on orientation to the wound center Notes: Wound/Skin Impairment Nursing Diagnoses: Impaired tissue integrity Goals: Ulcer/skin breakdown will have a volume reduction of 30% by week 4 Date Initiated: 09/10/2022 Target Resolution Date: 10/08/2022 Goal Status: Active Interventions: Assess ulceration(s) every visit Provide education on ulcer and skin care Treatment Activities: Consult for HBO : 09/10/2022 Skin care regimen initiated : 09/10/2022 Notes: Electronic Signature(s) Signed: 09/24/2022 5:15:19 PM By: Tommie Ard RN Entered By: Tommie Ard on 09/20/2022 13:26:08 -------------------------------------------------------------------------------- Pain Assessment Details Patient Name: Date of Service: [place], [name] 09/20/2022 1:00 PM Medical Record Number: [id number] Patient Account Number: [id number] Date of Birth/Sex: Treating RN: September 20, 1956 (66 y.o. [name] Cava Primary Care Shoua Ressler: Foye [name] Other Clinician: Referring Dijon Cosens: Treating Birdella Sippel/Extender: Kandis Cocking in Treatment: 1 Active Problems Location of Pain Severity and Description of Pain Patient Has Paino No Site Locations [place], [place] ([id number]) [id number] Page 6 of 10 Pain Management and Medication Current Pain Management: Electronic Signature(s) Signed: 09/24/2022 5:15:19 PM By: Tommie Ard RN Entered By: Tommie Ard on 09/20/2022 13:12:04 -------------------------------------------------------------------------------- Patient/Caregiver Education Details Patient Name: Date of Service: [name] Bolus [name] 10/9/2023andnbsp1:00 PM Medical Record Number: [id number] Patient Account Number: [id number] Date of Birth/Gender: Treating RN: [birth year] (66 y.o. [name] Cava Primary Care Physician: Foye [name] Other Clinician: Referring Physician: Treating Physician/Extender: Kandis Cocking in Treatment: 1 Education Assessment Education Provided To: Patient Education Topics Provided Wound/Skin Impairment: Methods: Explain/Verbal Responses: Reinforcements needed, State content correctly Electronic Signature(s) Signed: 09/24/2022 5:15:19 PM By: Tommie Ard RN Entered By: Tommie Ard on 09/20/2022 13:26:22 -------------------------------------------------------------------------------- Wound Assessment Details Patient Name: Date of Service: [name] Blazing, [name] 09/20/2022 1:00 PM Medical Record Number: [id number] Patient Account Number: [id number] Date of Birth/Sex: Treating RN: Oct 04, 1956 (66 y.o. [name] Cava Primary Care Gladyse Corvin: Foye [name] Other Clinician: Referring Donzell Coller: Treating Shirell Struthers/Extender: Kayleen Memos [place], Windy Fast [name] ([id number]) [id number] Page 7 of 10 Weeks in Treatment: 1 Wound Status Wound Number: 1 Primary Diabetic Wound/Ulcer of the Lower Extremity Etiology: Wound Location: Left Calcaneus Wound Open Wounding Event: Pressure Injury Status: Date Acquired: 03/09/2019 Comorbid Cataracts, Sleep Apnea, Hypertension, Type II Diabetes, Weeks Of Treatment: 1 History: Osteomyelitis, Neuropathy Clustered Wound: No Photos Wound Measurements Length: (cm) 5 Width: (cm) 5.8 Depth: (cm) 0.6 Area: (cm) 22.777 Volume: (cm) 13.666 % Reduction in Area: -5.5% % Reduction in Volume: 36.7% Epithelialization: Small (1-33%) Tunneling: No Undermining: No Wound Description Classification: Grade 3 Wound Margin: Fibrotic scar, thickened scar Exudate Amount: Medium Exudate Type: Serosanguineous Exudate Color: red, brown Foul Odor After Cleansing: No Slough/Fibrino Yes Wound Bed Granulation Amount: Medium (34-66%) Exposed Structure Granulation Quality: Red, Pink Fascia Exposed:  No Necrotic Amount: Medium (34-66%) Fat Layer (Subcutaneous Tissue) Exposed: Yes Necrotic Quality: Eschar, Adherent Slough Tendon Exposed: No Muscle Exposed: No Joint Exposed: No Bone Exposed: No Periwound Skin Texture Texture Color No Abnormalities Noted: No No Abnormalities Noted: No Callus: Yes Moisture No Abnormalities Noted: No Dry / Scaly: No Maceration: Yes Treatment Notes  Wound #1 (Calcaneus) Wound Laterality: Left Cleanser Soap and Water Discharge Instruction: May shower and wash wound with dial antibacterial soap and water prior to dressing change. Peri-Wound Care Sween Lotion (Moisturizing lotion) Discharge Instruction: Apply moisturizing lotion as directed Topical Gentamicin Discharge Instruction: As directed by physician Primary Dressing KASEAN, DENHERDER [name] ([id number]) [id number] Page 8 of 10 KerraCel Ag Gelling Fiber Dressing, 4x5 in (silver alginate) Discharge Instruction: Apply silver alginate to wound bed as instructed Secondary Dressing ABD Pad, 5x9 Discharge Instruction: Apply over primary dressing as directed. Zetuvit Plus Silicone Border Dressing 5x5 (in/in) Discharge Instruction: Apply silicone border over primary dressing as directed. Secured With [employer] Surgical T 2x10 (in/yd) ape Discharge Instruction: Secure with tape as directed. Compression Wrap Kerlix Roll 4.5x3.1 (in/yd) Discharge Instruction: Apply Kerlix and Coban compression as directed. Coban Self-Adherent Wrap 4x5 (in/yd) Discharge Instruction: Apply over Kerlix as directed. Compression Stockings Add-Ons Electronic Signature(s) Signed: 09/24/2022 5:15:19 PM By: Blanche East RN Entered By: Blanche East on 09/20/2022 13:22:36 -------------------------------------------------------------------------------- Wound Assessment Details Patient Name: Date of Service: [name], [name]. 09/20/2022 1:00 PM Medical Record Number: [id number] Patient  Account Number: [id number] Date of Birth/Sex: Treating RN: 19-May-1956 (66 y.o. [name] Primary Care Dyna Figuereo: Nelda Bucks Other Clinician: Referring Jakerra Floyd: Treating Noela Brothers/Extender: Doristine Bosworth in Treatment: 1 Wound Status Wound Number: 2 Primary Diabetic Wound/Ulcer of the Lower Extremity Etiology: Wound Location: Left, Dorsal Foot Wound Open Wounding Event: Pressure Injury Status: Date Acquired: 07/09/2022 Comorbid Cataracts, Sleep Apnea, Hypertension, Type II Diabetes, Weeks Of Treatment: 1 History: Osteomyelitis, Neuropathy Clustered Wound: No Photos Wound Measurements Length: (cm) 4 Width: (cm) 2.8 Depth: (cm) 0.1 Area: (cm) 8.796 Volume: (cm) 0.88 HERSHAL, ERIKSSON [name] ([id number]) Wound Description Classification: Grade 2 Exudate Amount: Medium Exudate Type: Serosanguineous Exudate Color: red, brown Foul Odor After Cleansing: No Slough/Fibrino Yes % Reduction in Area: 6.7% % Reduction in Volume: 6.6% Epithelialization: Small (1-33%) Tunneling: No Undermining: No [phone].pdf Page 9 of 10 Wound Bed Granulation Amount: Medium (34-66%) Exposed Structure Granulation Quality: Pink Fascia Exposed: No Necrotic Amount: Medium (34-66%) Fat Layer (Subcutaneous Tissue) Exposed: Yes Necrotic Quality: Eschar, Adherent Slough Tendon Exposed: No Muscle Exposed: No Joint Exposed: No Bone Exposed: No Periwound Skin Texture Texture Color No Abnormalities Noted: No No Abnormalities Noted: No Moisture No Abnormalities Noted: No Treatment Notes Wound #2 (Foot) Wound Laterality: Dorsal, Left Cleanser Soap and Water Discharge Instruction: May shower and wash wound with dial antibacterial soap and water prior to dressing change. Peri-Wound Care Sween Lotion (Moisturizing lotion) Discharge Instruction: Apply moisturizing lotion as directed Topical Gentamicin Discharge Instruction: As directed by  physician Primary Dressing KerraCel Ag Gelling Fiber Dressing, 4x5 in (silver alginate) Discharge Instruction: Apply silver alginate to wound bed as instructed Secondary Dressing ALLEVYN Heel 4 1/2in x 5 1/2in / 10.5cm x 13.5cm Discharge Instruction: Apply over primary dressing as directed. Zetuvit Plus Silicone Border Dressing 5x5 (in/in) Discharge Instruction: Apply silicone border over primary dressing as directed. Secured With [employer] Surgical T 2x10 (in/yd) ape Discharge Instruction: Secure with tape as directed. Compression Wrap Kerlix Roll 4.5x3.1 (in/yd) Discharge Instruction: Apply Kerlix and Coban compression as directed. Coban Self-Adherent Wrap 4x5 (in/yd) Discharge Instruction: Apply over Kerlix as directed. Compression Stockings Add-Ons Electronic Signature(s) Signed: 09/24/2022 5:15:19 PM By: Blanche East RN Entered By: Blanche East on 09/20/2022 13:27:16 YEHYA, BRENDLE [name] ([id number]) [id number] Page 10 of 10 -------------------------------------------------------------------------------- Vitals Details Patient Name: Date of Service: [name], [name]. 09/20/2022 1:00  PM Medical Record Number: [id number] Patient Account Number: [id number] Date of Birth/Sex: Treating RN: 05/24/1956 (66 y.o. [name] Cava Primary Care Kwanza Cancelliere: Foye [name] Other Clinician: Referring Abhinav Mayorquin: Treating Ambrea Hegler/Extender: Kandis Cocking in Treatment: 1 Vital Signs Time Taken: 13:11 Temperature (F): 97.8 Height (in): 74 Pulse (bpm): 91 Weight (lbs): 245 Respiratory Rate (breaths/min): 18 Body Mass Index (BMI): 31.5 Blood Pressure (mmHg): 168/90 Reference Range: 80 - 120 mg / dl Electronic Signature(s) Signed: 09/24/2022 5:15:19 PM By: Tommie Ard RN Entered By: Tommie Ard on 09/20/2022 13:11:56

## 2022-09-27 ENCOUNTER — Ambulatory Visit (HOSPITAL_BASED_OUTPATIENT_CLINIC_OR_DEPARTMENT_OTHER): Payer: Medicare Other | Admitting: General Surgery

## 2022-09-29 ENCOUNTER — Encounter (HOSPITAL_BASED_OUTPATIENT_CLINIC_OR_DEPARTMENT_OTHER): Payer: Medicare Other | Admitting: General Surgery

## 2022-09-29 DIAGNOSIS — E11621 Type 2 diabetes mellitus with foot ulcer: Secondary | ICD-10-CM | POA: Diagnosis not present

## 2022-09-30 ENCOUNTER — Encounter (HOSPITAL_BASED_OUTPATIENT_CLINIC_OR_DEPARTMENT_OTHER): Payer: Medicare Other | Admitting: Internal Medicine

## 2022-09-30 DIAGNOSIS — E11621 Type 2 diabetes mellitus with foot ulcer: Secondary | ICD-10-CM | POA: Diagnosis not present

## 2022-09-30 LAB — GLUCOSE, CAPILLARY: Glucose-Capillary: 128 mg/dL — ABNORMAL HIGH (ref 70–99)

## 2022-09-30 NOTE — Progress Notes (Addendum)
JADIER, ROCKERS R ([id number]) [id number] Page 1 of 2 Visit Report for 09/30/2022 HBO Details Patient Name: Date of Service: [place], [name] 09/30/2022 1:00 PM Medical Record Number: [id number] Patient Account Number: [id number] Date of Birth/Sex: Treating RN: Oct 10, 1956 (66 y.o. 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 HBO Treatment Details Treatment Number: 1 Patient Type: Outpatient Chamber Type: Monoplace Chamber Serial #: [id number] Treatment Protocol: 2.5 ATA with 90 minutes oxygen, with two 5 minute air breaks Treatment Details Compression Rate Down: 1.0 psi / minute De-Compression Rate Up: 1.0 psi / minute A breaks and breathing ir Compress Tx Pressure periods Decompress Decompress Begins Reached (leave unused spaces Begins Ends blank) Chamber Pressure (ATA 1 2.5 2.5 2.5 2.5 2.5 - - 2.5 1 ) Clock Time (24 hr) 14:04 14:34 15:04 15:09 15:39 15:44 - - 16:14 16:35 Treatment Length: 151 (minutes) Treatment Segments: 5 Vital Signs Capillary Blood Glucose Reference Range: 80 - 120 mg / dl HBO Diabetic Blood Glucose Intervention Range: <131 mg/dl or >867 mg/dl Time Capillary Blood Glucose Pulse Blood Respiratory Action Type: Vitals Pulse: Temperature: Glucose Oximetry Pressure: Rate: Meter #: Taken: Taken: (mg/dl): (%) given 8 oz glucerna, 2 CBG not taken per Pre 13:21 162/86 93 18 98.6 128 1 Caydyn Sprung Post 16:37 165/71 84 18 98.8 179 1 Treatment Response Treatment Toleration: Well Treatment Completion Status: Treatment Completed without Adverse Event Treatment  Notes Patient safely placed in chamber. Chamber rate set was 1 psi/min. Patient was well educated on the effects of pressure on the middle ear. He was very responsive to understanding that he should share with tech if his ears were not equalizing. Approximately 3 psi, patient was unable to equalize ears. This being his first treatment, I coached him on equalizing. After attempting to pass 3 psig for the second time without success, I turned chamber off which caused patient to feel relief. Patient given water to swallow to assist in ear equalization. During this attempt, Patient slowly progressed to treatment depth with a few times assisting with decrease in pressure. Patient was able to equalize with coaching until reaching treatment depth. Patient tolerated treatment well. Patient was able to tolerate decompression of the chamber at rate set of 1 psi/min. Dallis Darden Notes Patient's first treatment. exam Ent cerumen otw normal (previously seen by ENT),rest clear cvs normal Tolerated rx well Physician HBO Attestation: I certify that I supervised this HBO treatment in accordance with Medicare guidelines. A trained emergency response team is readily available per Yes hospital policies and procedures. Continue HBOT as ordered. [address] RASHOD, GOUGEON R ([id number]) [id number] Page 2 of 2 Electronic Signature(s) Signed: 10/01/2022 4:50:55 PM By: Haywood Pao CHT EMT BS , , Signed: 10/04/2022 12:37:39 PM By: Baltazar Najjar MD Previous Signature: 09/30/2022 7:48:03 PM Version By: Baltazar Najjar MD Previous Signature: 09/30/2022 5:45:43 PM Version By: Haywood Pao CHT EMT BS , , Entered By: Haywood Pao on 10/01/2022 16:50:55 -------------------------------------------------------------------------------- HBO Safety Checklist Details Patient Name: Date of Service: [place], [name] 09/30/2022 1:00 PM Medical Record Number: [id number] Patient Account Number:  [id number] Date of Birth/Sex: Treating RN: 30-Nov-1956 (66 y.o. [name] Primary Care Macarthur Lorusso: Foye [name] Other Clinician: Haywood Pao Referring Delfina Schreurs: Treating Alayla Dethlefs/Extender: Little Ishikawa in  Treatment: 2 HBO Safety Checklist Items Safety Checklist Consent Form Signed Patient voided / foley secured and emptied When did you last eato 1045 Last dose of injectable or oral agent 0930 Ostomy pouch emptied and vented if applicable NA All implantable devices assessed, documented and approved NA Intravenous access site secured and place NA Valuables secured Linens and cotton and cotton/polyester blend (less than 51% polyester) Personal oil-based products / skin lotions / body lotions removed Wigs or hairpieces removed NA Smoking or tobacco materials removed NA Books / newspapers / magazines / loose paper removed Cologne, aftershave, perfume and deodorant removed Jewelry removed (may wrap wedding band) Make-up removed NA Hair care products removed Battery operated devices (external) removed Heating patches and chemical warmers removed Titanium eyewear removed Nail polish cured greater than 10 hours NA Casting material cured greater than 10 hours NA Hearing aids removed NA Loose dentures or partials removed dentures removed Prosthetics have been removed NA Patient demonstrates correct use of air break device (if applicable) Patient concerns have been addressed Patient grounding bracelet on and cord attached to chamber Specifics for Inpatients (complete in addition to above) Medication sheet sent with patient NA Intravenous medications needed or due during therapy sent with patient NA Drainage tubes (e.g. nasogastric tube or chest tube secured and vented) NA Endotracheal or Tracheotomy tube secured NA Cuff deflated of air and inflated with saline NA Airway suctioned NA Notes Paper version used prior to  treatment. Electronic Signature(s) Signed: 09/30/2022 5:31:30 PM By: Donavan Burnet CHT EMT BS , , Entered By: Donavan Burnet on 09/30/2022 17:31:30

## 2022-10-01 ENCOUNTER — Encounter (HOSPITAL_BASED_OUTPATIENT_CLINIC_OR_DEPARTMENT_OTHER): Payer: Medicare Other | Admitting: Internal Medicine

## 2022-10-01 DIAGNOSIS — E11621 Type 2 diabetes mellitus with foot ulcer: Secondary | ICD-10-CM | POA: Diagnosis not present

## 2022-10-01 LAB — GLUCOSE, CAPILLARY
Glucose-Capillary: 179 mg/dL — ABNORMAL HIGH (ref 70–99)
Glucose-Capillary: 286 mg/dL — ABNORMAL HIGH (ref 70–99)
Glucose-Capillary: 236 mg/dL — ABNORMAL HIGH (ref 70–99)

## 2022-10-01 NOTE — Progress Notes (Addendum)
VEARL, ALLBAUGH [name] ([id number]) [id number] Page 1 of 3 Visit Report for 10/01/2022 HBO Details Patient Name: Date of Service: [name], [name]. 10/01/2022 1:00 PM Medical Record Number: [id number] Patient Account Number: [id number] Date of Birth/Sex: Treating RN: [birth year] (66 y.o. 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 HBO Treatment Details Treatment Number: 2 Patient Type: Outpatient Chamber Type: Monoplace Chamber Serial #: [id number] Treatment Protocol: 2.0 ATA with 90 minutes oxygen, and no air breaks Treatment Details Compression Rate Down: 1.0 psi / minute De-Compression Rate Up: 1.0 psi / minute Air breaks and breathing Decompress Decompress Compress Tx Pressure Begins Reached periods Begins Ends (leave unused spaces blank) Chamber Pressure (ATA 1 2 ------2 1 ) Clock Time (24 hr) 13:28 14:02 - - - - - - 15:32 15:47 Treatment Length: 139 (minutes) Treatment Segments: 5 Vital Signs Capillary Blood Glucose Reference Range: 80 - 120 mg / dl HBO Diabetic Blood Glucose Intervention Range: <131 mg/dl or >249 mg/dl Type: Time Vitals Blood Pulse: Respiratory Temperature: Capillary Blood Glucose Pulse Action Taken: Pressure: Rate: Glucose (mg/dl): Meter #: Oximetry (%) Taken: Pre 12:28 151/78 90 18 98.8 286 1 blood glucose > 249 mg/dL, asymptomatic. Post 15:59 157/85 81 18 98.7 236 1 none per protocol Treatment Response Treatment Toleration: Fair Treatment Completion Status: Treatment Completed without Adverse Event Treatment Notes Patient was placed in the chamber safely.  Chamber was pressurized to approximately 3 psig. Patient was not able to equalize pressure in middle ear appropriately. Pressure decreased to idle pressure, (1.5 psig), and patient still unable to equalize pressure. Chamber was turned off to allow pressure to return to 1 ATA. Retried pressurization with the same result and complaint of sinus pain. Chamber turned off, returned to 1 ATA. Informed physician. Patient given Afrin and order for today changed to 2.0 ATA x 90 min with no air breaks. Patient self-administered Afrin and waited approximately 20 minutes prior to attempting treatment. Chamber started, patient was able to equalize pressure in the middle ear, at an average travel rate of 0.43 psi/min using stops and travelling in 1 psi increments. Patient tolerated treatment well once at treatment pressure. Chamber was decompressed at a rate of 1 psi/min. Patient stated that he had residual "stuffiness" in his ears, but felt like they were slowly equalizing. [name] Notes The patient had complaints of right ear fullness and some discomfort and then some frontal sinus type pain. Looking into his right ear showed the canal to be occluded by cerumen and what looked to be dry skin. Yesterday I was able to see as drawn before his first treatment at least part of it did not look all that remarkable. We attempted to dive him at 2.5 atm he could not tolerate this we gave him some Afrin and reduce the target pressure to 2 atm and he seemed to tolerate this better. We will have to see how he does on Monday to determine whether he is going to require ENT referral. He did actually see ENT before he started however he was not symptomatic of course at that time Physician HBO Attestation: I certify that I supervised this HBO treatment in accordance with Medicare [name], [name] ([id number]) [id number] Page 2 of 3 guidelines. A trained emergency  response team is readily available per  Yes hospital policies and procedures. Continue HBOT as ordered. Yes Electronic Signature(s) Signed: 10/01/2022 4:47:23 PM By: Linton Ham MD Previous Signature: 10/01/2022 4:41:43 PM Version By: Donavan Burnet CHT EMT BS , , Entered By: Linton Ham on 10/01/2022 16:45:39 -------------------------------------------------------------------------------- HBO Safety Checklist Details Patient Name: Date of Service: [name]. 10/01/2022 1:00 PM Medical Record Number: [id number] Patient Account Number: [id number] Date of Birth/Sex: Treating RN: 1956/08/20 (66 y.o. [name] Primary Care Jonnathan Birman: Nelda Bucks Other Clinician: Donavan Burnet Referring [name]: Treating [name]/Extender: [name] in Treatment: 3 HBO Safety Checklist Items Safety Checklist Consent Form Signed Patient voided / foley secured and emptied When did you last eato Last Evening Last dose of injectable or oral agent 0600 Ostomy pouch emptied and vented if applicable NA All implantable devices assessed, documented and approved NA Intravenous access site secured and place NA Valuables secured Linens and cotton and cotton/polyester blend (less than 51% polyester) Personal oil-based products / skin lotions / body lotions removed Wigs or hairpieces removed NA Smoking or tobacco materials removed NA Books / newspapers / magazines / loose paper removed Cologne, aftershave, perfume and deodorant removed Jewelry removed (may wrap wedding band) Make-up removed NA Hair care products removed Battery operated devices (external) removed Heating patches and chemical warmers removed Titanium eyewear removed Nail polish cured greater than 10 hours NA Casting material cured greater than 10 hours NA Hearing aids removed NA Loose dentures or partials removed dentures removed Prosthetics have been removed NA Patient demonstrates correct use of air  break device (if applicable) Patient concerns have been addressed Patient grounding bracelet on and cord attached to chamber Specifics for Inpatients (complete in addition to above) Medication sheet sent with patient NA Intravenous medications needed or due during therapy sent with patient NA Drainage tubes (e.g. nasogastric tube or chest tube secured and vented) NA Endotracheal or Tracheotomy tube secured NA Cuff deflated of air and inflated with saline NA Airway suctioned NA Notes Paper version used prior to treatment. Electronic Signature(s) Signed: 10/01/2022 4:30:32 PM By: Donavan Burnet CHT EMT BS , , [place], [place] 10/01/2022 4:30:32 PM By: Donavan Burnet CHT EMT BS Signed: R ([id number]) [id number] Page 3 of 3 , , Entered By: Donavan Burnet on 10/01/2022 16:30:32

## 2022-10-01 NOTE — Progress Notes (Addendum)
JAMAURI, KRUZEL R ([id number]) [id number] Page 1 of 2 Visit Report for 10/01/2022 Arrival Information Details Patient Name: Date of Service: [place], [name] 10/01/2022 1:00 PM Medical Record Number: [id number] Patient Account Number: [id number] Date of Birth/Sex: Treating RN: 09-17-1956 (66 y.o. [name] Primary Care Aria Pickrell: Nelda Bucks Other Clinician: Donavan Burnet Referring Boyd Litaker: Treating Kenlea Woodell/Extender: Abelino Derrick in Treatment: 3 Visit Information History Since Last Visit All ordered tests and consults were completed: Yes Patient Arrived: Knee Scooter Added or deleted any medications: No Arrival Time: 11:55 Any new allergies or adverse reactions: No Accompanied By: self Had a fall or experienced change in No Transfer Assistance: None activities of daily living that may affect Patient Identification Verified: Yes risk of falls: Secondary Verification Process Completed: Yes Signs or symptoms of abuse/neglect since last visito No Patient Requires Transmission-Based Precautions: No Hospitalized since last visit: No Patient Has Alerts: No Implantable device outside of the clinic excluding No cellular tissue based products placed in the center since last visit: Pain Present Now: No Electronic Signature(s) Signed: 10/01/2022 4:43:13 PM By: Donavan Burnet CHT EMT BS , , Previous Signature: 10/01/2022 4:29:05 PM Version By: Donavan Burnet CHT EMT BS , , Entered By: Donavan Burnet on 10/01/2022 16:43:13 -------------------------------------------------------------------------------- Encounter Discharge Information Details Patient Name: Date of Service: [place], [name] 10/01/2022 1:00 PM Medical Record Number: [id number] Patient Account Number: [id number] Date of Birth/Sex: Treating RN: [birth year] (66 y.o. [name] Primary Care Paislee Szatkowski: Nelda Bucks Other Clinician:  Donavan Burnet Referring Gesselle Fitzsimons: Treating Kishawn Pickar/Extender: Abelino Derrick in Treatment: 3 Encounter Discharge Information Items Discharge Condition: Stable Ambulatory Status: Knee Scooter Discharge Destination: Home Transportation: Private Auto Accompanied By: self Schedule Follow-up Appointment: No Clinical Summary of Care: Electronic Signature(s) Signed: 10/01/2022 4:43:02 PM By: Donavan Burnet CHT EMT BS , , Entered By: Donavan Burnet on 10/01/2022 16:43:01 [place], [place] ([id number]) [id number] Page 2 of 2 -------------------------------------------------------------------------------- Patient/Caregiver Education Details Patient Name: Date of Service: [place], [place] 10/20/2023andnbsp1:00 PM Medical Record Number: [id number] Patient Account Number: [id number] Date of Birth/Gender: Treating RN: [birth year] (66 y.o. [name] Primary Care Physician: Nelda Bucks Other Clinician: Donavan Burnet Referring Physician: Treating Physician/Extender: Abelino Derrick in Treatment: 3 Education Assessment Education Provided To: Patient Education Topics Provided Elevated Blood Sugar/ Impact on Healing: Methods: Explain/Verbal Electronic Signature(s) Signed: 10/01/2022 5:17:04 PM By: Donavan Burnet CHT EMT BS , , Entered By: Donavan Burnet on 10/01/2022 16:52:40 -------------------------------------------------------------------------------- Vitals Details Patient Name: Date of Service: [name], [name] 10/01/2022 1:00 PM Medical Record Number: [id number] Patient Account Number: [id number] Date of Birth/Sex: Treating RN: 1956-09-18 (66 y.o. [name] Primary Care Kinzie Wickes: Nelda Bucks Other Clinician: Donavan Burnet Referring Kaileia Flow: Treating Inaki Vantine/Extender: Abelino Derrick in Treatment: 3 Vital Signs Time Taken:  12:28 Temperature (F): 98.8 Height (in): 74 Pulse (bpm): 90 Weight (lbs): 245 Respiratory Rate (breaths/min): 18 Body Mass Index (BMI): 31.5 Blood Pressure (mmHg): 151/78 Capillary Blood Glucose (mg/dl): 286 Reference Range: 80 - 120 mg / dl Electronic Signature(s) Signed: 10/01/2022 4:29:30 PM By: Donavan Burnet CHT EMT BS , , Entered By: Donavan Burnet on 10/01/2022 16:29:29

## 2022-10-01 NOTE — Progress Notes (Signed)
[name] [name], [name] [name] ([id number]) [id number] Page 1 of 3 Visit Report for 10/01/2022 Physician Orders Details Patient Name: Date of Service: [place], [name] LD [name]. 10/01/2022 1:00 PM Medical Record Number: [id number] Patient Account Number: [id number] Date of Birth/Sex: Treating RN: 10-Sep-1956 (66 y.o. 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 ir Wound Treatment Electronic Signature(s) Signed: 10/01/2022 4:43:55 PM By: Linton Ham MD Entered By: Linton Ham on 10/01/2022 16:43:54 -------------------------------------------------------------------------------- Problem List Details Patient Name: Date of Service: [place], [place] NA LD [name]. 10/01/2022 1:00 PM Medical Record Number: [id number] Patient Account Number: [id number] Date of Birth/Sex: Treating RN: August 25, 1956 (66 y.o. [name] [name] Primary Care Provider: Nelda Bucks Other Clinician: Donavan Burnet Referring Provider: Treating Provider/Extender: Abelino Derrick in Treatment: 3 Active Problems ICD-10 Encounter Code Description Active Date MDM Diagnosis [phone] Other chronic osteomyelitis, left ankle and foot 09/10/2022 No Yes E11.65 Type 2 diabetes mellitus with hyperglycemia 09/10/2022 No Yes E11.621 Type 2 diabetes mellitus with foot ulcer  09/10/2022 No Yes [name] [name] ([id number]) [phone].pdf Page 2 of 3 [phone] Non-pressure chronic ulcer of left heel and midfoot with 09/10/2022 No Yes necrosis of bone L97.523 Non-pressure chronic ulcer of other part of left foot with 09/10/2022 No Yes necrosis of muscle Inactive Problems Resolved Problems Electronic Signature(s) Signed: 10/01/2022 4:47:23 PM By: Linton Ham MD Signed: 10/01/2022 6:08:03 PM By: Deon Pilling RN, BSN Entered By: Deon Pilling on 10/01/2022 16:17:38 -------------------------------------------------------------------------------- SuperBill Details Patient Name: Date of Service: [place], [name] LD [name]. 10/01/2022 Medical Record Number: [id number] Patient Account Number: [id number] Date of Birth/Sex: Treating RN: 07-19-1956 (66 y.o. 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 Facility Procedures : CPT4 Code Description: [id number]-(Facility Use Only) HBOT full body chamber, 49min , ICD-10 Diagnosis Description 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, left ankle and foot L97.424 Non-pressure chronic ulcer of left heel and midfoot with n L97.523  Non-pressure chronic ulcer of other part of left foot with E11.621 Type 2 diabetes mellitus with foot ulcer Modifier: ecrosis of bo necrosis of Quantity: 5 ne muscle Physician Procedures : CPT4 Code Description Modifier [id number] - WC PHYS HYPERBARIC OXYGEN THERAPY  ICD-10 Diagnosis Description 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, left ankle and foot L97.424 Non-pressure chronic ulcer of left heel and midfoot with necrosis of bo  L97.523 Non-pressure chronic ulcer of other part of left foot with necrosis of E11.621 Type 2 diabetes mellitus with foot ulcer Quantity: 1 ne muscle Electronic Signature(s) Signed: 10/01/2022 4:42:41 PM By: Donavan Burnet CHT EMT BS , , Signed: 10/01/2022 4:47:23 PM By: Dellia Nims,  Legrand Como MD [name] [name] ([id number]) [id number] Page 3 of 3 Entered By: Donavan Burnet on 10/01/2022 16:42:40

## 2022-10-01 NOTE — Progress Notes (Addendum)
SAILOR, HAUGHN ([id number]) [id number] Page 1 of 3 Visit Report for 09/30/2022 Arrival Information Details Patient Name: Date of Service: [place], [name] 09/30/2022 1:00 PM Medical Record Number: [id number] Patient Account Number: [id number] Date of Birth/Sex: Treating RN: Apr 02, 1956 (66 y.o. [name] Primary Care Gabryel Files: Nelda Bucks Other Clinician: Donavan Burnet Referring Evans Levee: Treating China Deitrick/Extender: Abelino Derrick in Treatment: 2 Visit Information History Since Last Visit All ordered tests and consults were completed: Yes Patient Arrived: Knee Scooter Added or deleted any medications: No Arrival Time: 12:54 Any new allergies or adverse reactions: No Accompanied By: self Had a fall or experienced change in No Transfer Assistance: None activities of daily living that may affect Patient Identification Verified: Yes risk of falls: Secondary Verification Process Completed: Yes Signs or symptoms of abuse/neglect since last visito No Patient Requires Transmission-Based Precautions: No Hospitalized since last visit: No Patient Has Alerts: No Implantable device outside of the clinic excluding No cellular tissue based products placed in the center since last visit: Pain Present Now: No Electronic Signature(s) Signed: 09/30/2022 5:47:44 PM By: Donavan Burnet CHT EMT BS , , Previous Signature: 09/30/2022 5:29:40 PM Version By: Donavan Burnet CHT EMT BS , , Entered By: Donavan Burnet on 09/30/2022 17:47:44 -------------------------------------------------------------------------------- Encounter Discharge Information Details Patient Name: Date of Service: [place], [name] 09/30/2022 1:00 PM Medical Record Number: [id number] Patient Account Number: [id number] Date of Birth/Sex: Treating RN: May 08, 1956 (66 y.o. [name] Primary Care Molli Gethers: Nelda Bucks Other  Clinician: Donavan Burnet Referring Tameshia Bonneville: Treating Ivanna Kocak/Extender: Abelino Derrick in Treatment: 2 Encounter Discharge Information Items Discharge Condition: Stable Ambulatory Status: Knee Scooter Discharge Destination: Home Transportation: Private Auto Accompanied By: self Schedule Follow-up Appointment: No Clinical Summary of Care: Electronic Signature(s) Signed: 09/30/2022 5:47:21 PM By: Donavan Burnet CHT EMT BS , , Entered By: Donavan Burnet on 09/30/2022 17:47:21 [place], [place] ([id number]) [id number] Page 2 of 3 -------------------------------------------------------------------------------- Patient/Caregiver Education Details Patient Name: Date of Service: [place], [name] 10/19/2023andnbsp1:00 PM Medical Record Number: [id number] Patient Account Number: [id number] Date of Birth/Gender: Treating RN: [birth year] (66 y.o. [name] Primary Care Physician: Nelda Bucks Other Clinician: Donavan Burnet Referring Physician: Treating Physician/Extender: Abelino Derrick in Treatment: 2 Education Assessment Education Provided To: Patient Education Topics Provided Hyperbaric Oxygenation: Methods: Explain/Verbal Responses: Reinforcements needed, State content correctly Nutrition: Methods: Explain/Verbal Responses: Reinforcements needed, State content correctly Offloading: Methods: Explain/Verbal Responses: Reinforcements needed Smoking and Wound Healing: Methods: Explain/Verbal Responses: State content correctly Tissue Oxygenation: Methods: Explain/Verbal Responses: Reinforcements needed, State content correctly Electronic Signature(s) Signed: 10/01/2022 5:17:04 PM By: Donavan Burnet CHT EMT BS , , Entered By: Donavan Burnet on 10/01/2022 16:54:08 -------------------------------------------------------------------------------- [place] Details Patient  Name: Date of Service: [name], [name] 09/30/2022 1:00 PM Medical Record Number: [id number] Patient Account Number: [id number] Date of Birth/Sex: Treating RN: Feb 20, 1956 (66 y.o. [name] Primary Care Jonluke Cobbins: Nelda Bucks Other Clinician: Donavan Burnet Referring Kirklin Mcduffee: Treating Gwyneth Fernandez/Extender: Abelino Derrick in Treatment: 2 Vital Signs Time Taken: 13:21 Temperature (F): 98.6 Height (in): 74 Pulse (bpm): 93 Weight (lbs): 245 Respiratory Rate (breaths/min): 18 Body Mass Index (BMI): 31.5 Blood Pressure (mmHg): 162/86 Capillary Blood Glucose (mg/dl): 128 Reference Range: 80 - 120 mg / dl CONAN, MCMANAWAY ([id number]) [id number] Page 3 of 3 Electronic Signature(s) Signed: 09/30/2022 5:30:11 PM By: Donavan Burnet CHT EMT BS , , Entered By: Donavan Burnet on 09/30/2022 17:30:11

## 2022-10-01 NOTE — Progress Notes (Signed)
KALEAB, FRASIER ([id number]) [id number] Page 1 of 1 Visit Report for 09/30/2022 SuperBill Details Patient Name: Date of Service: [name], [name] [name] 09/30/2022 Medical Record Number: [id number] Patient Account Number: [id number] Date of Birth/Sex: Treating RN: 1956-03-16 (66 y.o. 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 Facility Procedures CPT4 Code Description Modifier Quantity [id number] G0277-(Facility Use Only) HBOT full body chamber, 54min , 5 ICD-10 Diagnosis Description [phone] Other chronic osteomyelitis, 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 Physician Procedures Quantity CPT4 Code Description Modifier [id number] - WC PHYS HYPERBARIC OXYGEN THERAPY 1 ICD-10 Diagnosis Description 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, 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

## 2022-10-04 ENCOUNTER — Encounter (HOSPITAL_BASED_OUTPATIENT_CLINIC_OR_DEPARTMENT_OTHER): Payer: Medicare Other | Admitting: Internal Medicine

## 2022-10-04 DIAGNOSIS — E11621 Type 2 diabetes mellitus with foot ulcer: Secondary | ICD-10-CM | POA: Diagnosis not present

## 2022-10-04 DIAGNOSIS — L97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: Secondary | ICD-10-CM | POA: Diagnosis not present

## 2022-10-04 DIAGNOSIS — M86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, left ankle and foot: Secondary | ICD-10-CM

## 2022-10-04 DIAGNOSIS — L97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: Secondary | ICD-10-CM

## 2022-10-04 LAB — GLUCOSE, CAPILLARY
Glucose-Capillary: 207 mg/dL — ABNORMAL HIGH (ref 70–99)
Glucose-Capillary: 169 mg/dL — ABNORMAL HIGH (ref 70–99)

## 2022-10-04 NOTE — Progress Notes (Addendum)
[name], [name] ([id number]) [id number] Page 1 of 2 Visit Report for 10/04/2022 HBO Details Patient Name: Date of Service: [name], [name] [name] 10/04/2022 8:30 A M Medical Record Number: [id number] Patient Account Number: [id number] Date of Birth/Sex: Treating RN: 28-Jun-1956 (66 y.o. 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 HBO Treatment Details Treatment Number: 3 Patient Type: Outpatient Chamber Type: Monoplace Chamber Serial #: [id number] Treatment Protocol: 2.0 ATA with 90 minutes oxygen, with two 5 minute air breaks Treatment Details Compression Rate Down: 1.0 psi / minute De-Compression Rate Up: 1.0 psi / minute A breaks and breathing ir Compress Tx Pressure periods Decompress Decompress Begins Reached (leave unused spaces Begins Ends blank) Chamber Pressure (ATA 1 2 2 2 2 2  --2 1 ) Clock Time (24 hr) 08:51 09:27 09:57 10:02 10:32 10:37 - - 11:07 11:32 Treatment Length: 161 (minutes) Treatment Segments: 5 Vital Signs Capillary Blood Glucose Reference Range: 80 - 120 mg / dl HBO Diabetic Blood Glucose Intervention Range: <131 mg/dl or mg/dl Time Vitals Blood Respiratory Capillary Blood Glucose Pulse Action Type: Pulse: Temperature: Taken: Pressure: Rate: Glucose (mg/dl): Meter #: Oximetry (%) Taken: Pre 08:43 159/87 91 16 98.6 207 Post 11:42 159/87 79 16 98.1 169 Treatment Response Treatment Toleration: Well Treatment Completion Status: Treatment Completed without Adverse Event Treatment Notes The patient complained of some ear pain iin his right ear. The patient was  seen by Dr. >846 before the treatment to check his ears. Right ear was clear per Dr. Mikey Bussing. Treatment started I checked to see if the patient was able to clear his ears at 2,3,6,8,10,15,18 PSI. At [facility] the patient stated that he was having problem clear his right ear. Pressure dropped to 18 PSI at 0921 and he was able to clear, and compression was restarted. Arrived at 2.5 ATA at 0927. I spoke with [facility]. [facility] about how long it took for him to clear. Dr. Mikey Bussing asked for me to make an appointment with ENT for him. I called Dr. Mikey Bussing office and made an appointment for Thursday 10/07/2022 for 10:00 am. The patient was informed of his appointment. eoh On Decompression he had no problems until he got to 4 PSI, and stated that he was having problems with right ear. Decompression stopped until he was able to clear. Dr. 10/09/2022 called to look at the patient ears. Physician HBO Attestation: I certify that I supervised this HBO treatment in accordance with Medicare guidelines. A trained emergency response team is readily available per Yes hospital policies and procedures. Continue HBOT as ordered. Yes Electronic Signature(s) Signed: 10/07/2022 10:15:01 AM By: 10/09/2022 DO Previous Signature: 10/04/2022 3:50:35 PM Version By: 10/06/2022 EMT Previous Signature: 10/04/2022 4:00:55 PM Version By: 10/06/2022 DO Entered By: [name] on 10/07/2022 10:13:16 10/09/2022 R (Nona [id number] Page 2 of 2 -------------------------------------------------------------------------------- HBO Safety Checklist Details Patient Name: Date of Service: [name], [name] [name] LD R. 10/04/2022 8:30 A M Medical Record Number: 10/06/2022 Patient Account Number: [id number] Date of Birth/Sex: Treating RN: 04-22-1956 (66 y.o. 79 Primary Care Pressley Barsky: Dianna Limbo Other Clinician: Foye [name] Referring Starlet Gallentine: Treating Adedamola Seto/Extender: [name] in Treatment: 3 HBO Safety Checklist Items Safety Checklist Consent Form Signed Patient voided /  foley secured and emptied When did you last eato last night Last dose of injectable or oral agent last night Ostomy pouch emptied and vented if applicable NA All implantable devices assessed, documented and approved NA Intravenous access site secured and place NA Valuables secured Linens and cotton and cotton/polyester blend (less than 51% polyester) Personal oil-based products / skin lotions / body lotions removed Wigs or hairpieces removed NA Smoking or tobacco materials removed Books / newspapers / magazines / loose paper removed Cologne, aftershave, perfume and deodorant removed Jewelry removed (may wrap wedding band) Make-up removed NA Hair care products removed Battery operated devices (external) removed Heating patches and chemical warmers removed Titanium eyewear removed NA Nail polish cured greater than 10 hours NA Casting material cured greater than 10 hours NA Hearing aids removed NA Loose dentures or partials removed removed by patient Prosthetics have been removed NA Patient demonstrates correct use of air break device (if applicable) Patient concerns have been addressed Patient grounding bracelet on and cord attached to chamber Specifics for Inpatients (complete in addition to above) Medication sheet sent with patient NA Intravenous medications needed or due during therapy sent with patient NA Drainage tubes (e.g. nasogastric tube or chest tube secured and vented) NA Endotracheal or Tracheotomy tube secured NA Cuff deflated of air and inflated with saline NA Airway suctioned NA Notes The safety checklist was done before the treatment was started. Electronic Signature(s) Signed: 10/04/2022 2:32:50 PM By: Valeria Batman EMT Entered By: Valeria Batman on 10/04/2022 14:32:49

## 2022-10-04 NOTE — Progress Notes (Signed)
JOSEANDRES, MAZER ([id number]) [id number] Page 1 of 2 Visit Report for 10/04/2022 Arrival Information Details Patient Name: Date of Service: [name], [name] [name] 10/04/2022 8:30 A M Medical Record Number: [id number] Patient Account Number: [id number] Date of Birth/Sex: Treating RN: [birth year] (66 y.o. [name] Primary Care Rogena Deupree: Nelda Bucks Other Clinician: Valeria Batman Referring Irven Ingalsbe: Treating Aiana Nordquist/Extender: Colin Benton in Treatment: 3 Visit Information History Since Last Visit All ordered tests and consults were completed: Yes Patient Arrived: Knee Scooter Added or deleted any medications: No Arrival Time: 08:01 Any new allergies or adverse reactions: No Accompanied By: None Had a fall or experienced change in No Transfer Assistance: None activities of daily living that may affect Patient Requires Transmission-Based Precautions: No risk of falls: Patient Has Alerts: No Signs or symptoms of abuse/neglect since last visito No Hospitalized since last visit: No Implantable device outside of the clinic excluding No cellular tissue based products placed in the center since last visit: Pain Present Now: No Electronic Signature(s) Signed: 10/04/2022 2:26:59 PM By: Valeria Batman EMT Entered By: Valeria Batman on 10/04/2022 14:26:59 -------------------------------------------------------------------------------- Encounter Discharge Information Details Patient Name: Date of Service: [name], [name] [place] LD R. 10/04/2022 8:30 A M Medical Record Number: [id number] Patient Account Number: [id number] Date of Birth/Sex: Treating RN: 1956/07/01 (66 y.o. [name] Primary Care Herberto Ledwell: Nelda Bucks Other Clinician: Valeria Batman Referring Quita Mcgrory: Treating Celita Aron/Extender: Colin Benton in Treatment: 3 Encounter Discharge Information Items Discharge Condition:  Stable Ambulatory Status: Knee Scooter Discharge Destination: Home Transportation: Private Auto Accompanied By: None Schedule Follow-up Appointment: Yes Clinical Summary of Care: Notes Patient to return for HBO treatment after cleared by ENT. Electronic Signature(s) Signed: 10/04/2022 3:52:29 PM By: Valeria Batman EMT Entered By: Valeria Batman on 10/04/2022 15:52:28 [name] ([id number]) [id number] Page 2 of 2 -------------------------------------------------------------------------------- Vitals Details Patient Name: Date of Service: [name], [name] [name] 10/04/2022 8:30 A M Medical Record Number: [id number] Patient Account Number: [id number] Date of Birth/Sex: Treating RN: [birth year] (66 y.o. [name] Primary Care Icey Tello: Nelda Bucks Other Clinician: Valeria Batman Referring Joan Avetisyan: Treating Elna Radovich/Extender: Colin Benton in Treatment: 3 Vital Signs Time Taken: 08:43 Temperature (F): 98.6 Height (in): 74 Pulse (bpm): 91 Weight (lbs): 245 Respiratory Rate (breaths/min): 16 Body Mass Index (BMI): 31.5 Blood Pressure (mmHg): 159/87 Capillary Blood Glucose (mg/dl): 207 Reference Range: 80 - 120 mg / dl Electronic Signature(s) Signed: 10/04/2022 2:31:17 PM By: Valeria Batman EMT Entered By: Valeria Batman on 10/04/2022 14:31:17

## 2022-10-04 NOTE — Progress Notes (Signed)
[name], [name] ([id number]) [id number] Page 1 of 2 Visit Report for 10/04/2022 Problem List Details Patient Name: Date of Service: [name], [name]. 10/04/2022 8:30 A M Medical Record Number: [id number] Patient Account Number: [id number] Date of Birth/Sex: Treating RN: Sep 28, 1956 (66 y.o. [name] Primary Care Provider: Nelda Bucks Other Clinician: Valeria Batman Referring Provider: Treating Provider/Extender: Colin Benton in Treatment: 3 Active Problems ICD-10 Encounter Code Description Active Date MDM Diagnosis [phone] Other chronic osteomyelitis, left ankle and foot 09/10/2022 No Yes E11.65 Type 2 diabetes mellitus with hyperglycemia 09/10/2022 No Yes E11.621 Type 2 diabetes mellitus with foot ulcer 09/10/2022 No Yes L97.424 Non-pressure chronic ulcer of left heel and midfoot with 09/10/2022 No Yes necrosis of bone L97.523 Non-pressure chronic ulcer of other part of left foot with 09/10/2022 No Yes necrosis of muscle Inactive Problems Resolved Problems Electronic Signature(s) Signed: 10/04/2022 3:51:20 PM By: Valeria Batman EMT Signed: 10/04/2022 4:00:55 PM By: Kalman Shan DO Entered By: Valeria Batman on 10/04/2022 15:51:20 -------------------------------------------------------------------------------- SuperBill Details Patient Name: Date of Service: [name], [name]. 10/04/2022 Medical Record Number: [id number] Patient Account Number: [id number] Date of Birth/Sex: Treating RN: 08-Dec-1956 (66 y.o. 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 Facility Procedures : CPT4 Code Description: [id number] G0277-(Facility Use Only) HBOT full body chamber, 56min , ICD-10 Diagnosis Description E11.621 Type 2 diabetes mellitus with foot ulcer 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, left ankle and foot L97.424 Non-pressure chronic  ulcer of left heel and midfoot with n L97.523 Non-pressure chronic ulcer of other part of left foot with Modifier: ecrosis of bo necrosis of Quantity: 5 ne muscle Physician Procedures : CPT4 Code Description Modifier [id number] - WC PHYS HYPERBARIC OXYGEN THERAPY ICD-10 Diagnosis Description E11.621 Type 2 diabetes mellitus with foot ulcer 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, left ankle and foot L97.424 Non-pressure chronic ulcer  of left heel and midfoot with necrosis of bo L97.523 Non-pressure chronic ulcer of other part of left foot with necrosis of Quantity: 1 ne muscle Electronic Signature(s) Signed: 10/04/2022 3:51:13 PM By: Valeria Batman EMT Signed: 10/04/2022 4:00:55 PM By: Kalman Shan DO Entered By: Valeria Batman on 10/04/2022 15:51:13

## 2022-10-05 ENCOUNTER — Encounter (HOSPITAL_BASED_OUTPATIENT_CLINIC_OR_DEPARTMENT_OTHER): Payer: Medicare Other | Admitting: Internal Medicine

## 2022-10-06 ENCOUNTER — Encounter (HOSPITAL_BASED_OUTPATIENT_CLINIC_OR_DEPARTMENT_OTHER): Payer: Medicare Other | Admitting: General Surgery

## 2022-10-06 DIAGNOSIS — E11621 Type 2 diabetes mellitus with foot ulcer: Secondary | ICD-10-CM | POA: Diagnosis not present

## 2022-10-06 LAB — GLUCOSE, CAPILLARY: Glucose-Capillary: 89 mg/dL (ref 70–99)

## 2022-10-07 ENCOUNTER — Encounter (HOSPITAL_BASED_OUTPATIENT_CLINIC_OR_DEPARTMENT_OTHER): Payer: Medicare Other | Admitting: General Surgery

## 2022-10-07 NOTE — Progress Notes (Signed)
[name], [name] ([id number]) [id number] Page 1 of 12 Visit Report for 10/06/2022 Chief Complaint Document Details Patient Name: Date of Service: [name]. 10/06/2022 10:15 A M Medical Record Number: [id number] Patient Account Number: [id number] Date of Birth/Sex: Treating RN: 1956-04-15 (66 y.o. [name] Primary Care Provider: Nelda [name] Other Clinician: Referring Provider: Treating Provider/Extender: [name] in Treatment: 3 Information Obtained from: Patient Chief Complaint Patients presents for treatment of an open diabetic ulcer and evaluation for hyperbaric oxygen therapy Electronic Signature(s) Signed: 10/06/2022 11:36:16 AM By: [name] Maudlin MD FACS Entered By: [name] on 10/06/2022 11:36:16 -------------------------------------------------------------------------------- Debridement Details Patient Name: Date of Service: [name], [name] 10/06/2022 10:15 A M Medical Record Number: [id number] Patient Account Number: [id number] Date of Birth/Sex: Treating RN: 1956/08/05 (66 y.o. [name] Primary Care Provider: Nelda [name] Other Clinician: Referring Provider: Treating Provider/Extender: [name] in Treatment: 3 Debridement Performed for Assessment: Wound #2 Left,Dorsal Foot Performed By: Physician [name] Maudlin, MD Debridement Type: Debridement Severity of Tissue Pre Debridement: Fat layer exposed Level of Consciousness (Pre-procedure): Awake and Alert Pre-procedure Verification/Time Out Yes - 11:05 Taken: Start Time: 11:06 T Area Debrided (L x W): otal 3.5 (cm) x 2.6 (cm) = 9.1 (cm) Tissue and other material debrided: Viable, Non-Viable, Slough, Subcutaneous, Slough Level: Skin/Subcutaneous Tissue Debridement Description: Excisional Instrument: Curette Bleeding: Minimum Hemostasis Achieved: Pressure Procedural Pain: 0 Post  Procedural Pain: 0 Response to Treatment: Procedure was tolerated well Level of Consciousness (Post- Awake and Alert procedure): Post Debridement Measurements of Total Wound Length: (cm) 3.5 Width: (cm) 2.6 Depth: (cm) 0.1 Volume: (cm) 0.715 Character of Wound/Ulcer Post Debridement: Improved Severity of Tissue Post Debridement: Fat layer exposed Post Procedure Diagnosis [name], [name] ([id number]) [id number] Page 2 of 12 Same as Pre-procedure Notes Scribed for [name] by [name] Gouty, RN Electronic Signature(s) Signed: 10/06/2022 12:04:38 PM By: [name] Maudlin MD FACS Signed: 10/06/2022 6:06:53 PM By: [name] Gouty RN, BSN Entered By: [name] on 10/06/2022 11:12:14 -------------------------------------------------------------------------------- Debridement Details Patient Name: Date of Service: [name], [name] 10/06/2022 10:15 A M Medical Record Number: [id number] Patient Account Number: [id number] Date of Birth/Sex: Treating RN: 05-04-1956 (66 y.o. [name] Primary Care Provider: Nelda [name] Other Clinician: Referring Provider: Treating Provider/Extender: [name] in Treatment: 3 Debridement Performed for Assessment: Wound #1 Left Calcaneus Performed By: Physician [name] Maudlin, MD Debridement Type: Debridement Severity of Tissue Pre Debridement: Fat layer exposed Level of Consciousness (Pre-procedure): Awake and Alert Pre-procedure Verification/Time Out Yes - 11:05 Taken: Start Time: 11:06 T Area Debrided (L x W): otal 5.4 (cm) x 4.9 (cm) = 26.46 (cm) Tissue and other material debrided: Non-Viable, Callus, Slough, Subcutaneous, Slough Level: Skin/Subcutaneous Tissue Debridement Description: Excisional Instrument: Curette Specimen: Tissue Culture Number of Specimens T aken: 1 Bleeding: Minimum Hemostasis Achieved: Pressure Procedural Pain: 0 Post Procedural Pain:  0 Response to Treatment: Procedure was tolerated well Level of Consciousness (Post- Awake and Alert procedure): Post Debridement Measurements of Total Wound Length: (cm) 5.4 Width: (cm) 4.9 Depth: (cm) 0.3 Volume: (cm) 6.234 Character of Wound/Ulcer Post Debridement: Improved Severity of Tissue Post Debridement: Fat layer exposed Post Procedure Diagnosis Same as Pre-procedure Notes Scribed for [name] by [name] Gouty, RN Electronic Signature(s) Signed: 10/06/2022 12:04:38 PM By: [name] Maudlin MD FACS Signed: 10/06/2022 6:06:53 PM By: [name] Gouty RN, BSN Entered By: [name] on 10/06/2022 11:15:46 [name], [name] ([id number]) [id number] Page 3 of 12 --------------------------------------------------------------------------------  HPI Details Patient Name: Date of Service: [name], [name] [name] 10/06/2022 10:15 A M Medical Record Number: [id number] Patient Account Number: [id number] Date of Birth/Sex: Treating RN: 18-Nov-1956 (66 y.o. [name] Primary Care Provider: Nelda [name] Other Clinician: Referring Provider: Treating Provider/Extender: [name] in Treatment: 3 History of Present Illness HPI Description: ADMISSION 09/10/2022 This is a 66 year old poorly controlled type II diabetic (last hemoglobin A1c 10.8%) who has had an ulcer on his heel for over 3 years. He has been seen in multiple wound care centers, including [facility] and [facility]. He reports that at least 3 doctors have recommended that he undergo below-knee amputation. He most recently met with Dr. Catalina Gravel, a vascular surgeon affiliated with [facility]. 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 Unfortunately, the patient has some extenuating social circumstances including the fact that he cares for his wife who has stage IV colon cancer  and still works, driving vehicles for [employer]. 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 He was recently hospitalized at [facility] for sepsis and currently has a PICC line through which he receives IV antibiotics. He reports having had another MRI during that hospital stay along with a chest x-ray and EKG. He apparently contacted one of the hyperbaric therapy techs here and asked a number of questions about hyperbaric oxygen treatments. He subsequently self-referred to our center to undergo further evaluation and management. I [name] to him that [name] actually has hyperbaric chambers, but he states that he lives in [place] and this would be more convenient for him given the intensive nature of the therapy and time requirement. ABI in clinic today was 0.94. The patient actually has 2 wounds. There is a wound on the dorsum of his left foot with heavy black eschar and slough present. After debridement, this was demonstrated to involve the muscle and the extensor tendons are exposed. On his heel, there is essentially a "shark bite" type wound, with much of the heel fat pad absent. The muscle layer is exposed. There is blue-green staining around the perimeter of the wound, but no significant odor. He says he has been applying collagen to the wound on his heel and Silvadene and Betadine to the wound on his dorsal foot. 09/20/2022: The heel wound is quite macerated with wet periwound callus. There is slough accumulation on the surface. The dorsal foot wound looks better this week. There is still exposed tendon, but it is fairly clean with just a little biofilm buildup. We are still working on gathering the required documentation to submit for pretreatment review for hyperbaric oxygen therapy. 09/29/2022: The dorsal foot wound continues to improve. I do not see any exposed tendon at this point. There is just some slough accumulation on the wound surface. He  continues to have very wet macerated periwound callus on his heel. There is slough on the surface, but it is loose and thin. There is an area of undermining at the 11 o'clock position, but the overlying skin and subcutaneous tissue is healthy and viable. He has been approved for hyperbaric oxygen therapy and will start treatment tomorrow. 10/06/2022: Continued contraction and improvement of the dorsal foot wound. There is just a little bit of slough accumulation on the wound surface. The periwound callus continues to accumulate on the heel wound and it is quite macerated. It is also persistently found with blue-green  staining present. He did initiate his hyperbaric oxygen therapy, but has had significant difficulty with decompression. He will be going to ENT to have PE tubes placed. Electronic Signature(s) Signed: 10/06/2022 11:40:42 AM By: [name] Maudlin MD FACS Previous Signature: 10/06/2022 11:37:48 AM Version By: [name] Maudlin MD FACS Entered By: [name] on 10/06/2022 11:40:41 -------------------------------------------------------------------------------- Physical Exam Details Patient Name: Date of Service: [place], [name] 10/06/2022 10:15 A M Medical Record Number: [id number] Patient Account Number: [id number] Date of Birth/Sex: Treating RN: 09-18-1956 (66 y.o. [name] Primary Care Provider: Nelda [name] Other Clinician: Referring Provider: Treating Provider/Extender: [name] in Treatment: 3 Constitutional Slightly hypertensive. . . . No acute distress.Marland Kitchen Respiratory Normal work of breathing on room air.. Notes 10/06/2022: Continued contraction and improvement of the dorsal foot wound. There is just a little bit of slough accumulation on the wound surface. The CLEMONS, SALVUCCI ([id number]) [id number] Page 4 of 12 periwound callus continues to accumulate on the heel wound and it is quite  macerated. It is also persistently found with blue-green staining present Electronic Signature(s) Signed: 10/06/2022 11:41:27 AM By: [name] Maudlin MD FACS Entered By: [name] on 10/06/2022 11:41:26 -------------------------------------------------------------------------------- Physician Orders Details Patient Name: Date of Service: [place], [name] 10/06/2022 10:15 A M Medical Record Number: [id number] Patient Account Number: [id number] Date of Birth/Sex: Treating RN: Nov 03, 1956 (66 y.o. 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 - [name] Rm 4 Return A Bathing/ Shower/ Hygiene May shower with protection but do not get wound dressing(s) wet. Edema Control - Lymphedema / SCD / Other Left Lower Extremity Elevate legs to the level of the heart or above for 30 minutes daily and/or when sitting, a frequency of: Avoid standing for long periods of time. Hyperbaric Oxygen Therapy Wound #1 Left Calcaneus Evaluate for HBO Therapy Indication: - chronic refractory osteomyelitis If appropriate for treatment, begin HBOT per protocol: 2.5 ATA for 90 Minutes with 2 Five (5) Minute A Breaks ir Total Number of Treatments: - 40 One treatments per day (delivered Monday through Friday unless otherwise specified in Special Instructions below): Finger stick Blood Glucose Pre- and Post- HBOT Treatment. Follow  Hyperbaric Oxygen Glycemia Protocol A frin (Oxymetazoline HCL) 0.05% nasal spray - 1 spray in both nostrils daily as needed prior to HBO treatment for difficulty clearing ears Wound Treatment Wound #1 - Calcaneus Wound Laterality: Left Cleanser: Soap and Water 1 x Per Day/30 Days Discharge Instructions: May shower and wash wound with dial antibacterial soap and water prior to dressing change. Peri-Wound Care: Sween Lotion (Moisturizing lotion) 1 x Per Day/30 Days Discharge Instructions: Apply moisturizing lotion as directed Topical: Gentamicin 1 x Per Day/30 Days Discharge Instructions: As directed by physician Prim Dressing: KerraCel Ag Gelling Fiber Dressing, 4x5 in (silver alginate) (Generic) 1 x Per Day/30 Days ary Discharge Instructions: Apply silver alginate to wound bed as instructed Secondary Dressing: ABD Pad, 5x9 (Generic) 1 x Per Day/30 Days Discharge Instructions: Apply over primary dressing as directed. Secondary Dressing: Zetuvit  Plus Silicone Border Dressing 5x5 (in/in) 1 x Per Day/30 Days Discharge Instructions: Apply silicone border over primary dressing as directed. [name], [name] ([id number]) [id number] Page 5 of 12 Secured With: 74M Medipore Public affairs consultant Surgical T 2x10 (in/yd) 1 x Per Day/30 Days ape Discharge Instructions: Secure with tape as directed. Compression Wrap: Kerlix Roll 4.5x3.1 (in/yd) (Generic) 1 x Per Day/30 Days Discharge Instructions: Apply Kerlix and Coban compression as directed. Compression Wrap: Coban Self-Adherent Wrap 4x5 (in/yd) (Generic) 1 x Per Day/30 Days Discharge Instructions: Apply over Kerlix as directed. Wound #2 - Foot Wound Laterality: Dorsal, Left Cleanser: Soap and Water 1 x Per Day/30 Days Discharge Instructions: May shower and wash wound with dial antibacterial soap and water prior to dressing change. Peri-Wound Care: Sween Lotion (Moisturizing lotion) 1 x Per Day/30 Days Discharge Instructions: Apply  moisturizing lotion as directed Topical: Gentamicin 1 x Per Day/30 Days Discharge Instructions: As directed by physician Prim Dressing: KerraCel Ag Gelling Fiber Dressing, 4x5 in (silver alginate) (Generic) 1 x Per Day/30 Days ary Discharge Instructions: Apply silver alginate to wound bed as instructed Secondary Dressing: ALLEVYN Heel 4 1/2in x 5 1/2in / 10.5cm x 13.5cm (Generic) 1 x Per Day/30 Days Discharge Instructions: Apply over primary dressing as directed. Secondary Dressing: Zetuvit Plus Silicone Border Dressing 5x5 (in/in) (Generic) 1 x Per Day/30 Days Discharge Instructions: Apply silicone border over primary dressing as directed. Secured With: 74M Medipore Public affairs consultant Surgical T 2x10 (in/yd) 1 x Per Day/30 Days ape Discharge Instructions: Secure with tape as directed. Compression Wrap: Kerlix Roll 4.5x3.1 (in/yd) (Generic) 1 x Per Day/30 Days Discharge Instructions: Apply Kerlix and Coban compression as directed. Compression Wrap: Coban Self-Adherent Wrap 4x5 (in/yd) (Generic) 1 x Per Day/30 Days Discharge Instructions: Apply over Kerlix as directed. Laboratory naerobe culture (MICRO) Bacteria identified in Unspecified specimen by A LOINC Code: 500-9 Convenience Name: Anaerobic culture GLYCEMIA INTERVENTIONS PROTOCOL PRE-HBO GLYCEMIA INTERVENTIONS ACTION INTERVENTION Obtain pre-HBO capillary blood glucose (ensure 1 physician order is in chart). A. Notify HBO physician and await physician orders. 2 If result is 70 mg/dl or below: B. If the result meets the hospital definition of a critical result, follow hospital policy. A. Give patient an 8 ounce Glucerna Shake, an 8 ounce Ensure, or 8 ounces of a Glucerna/Ensure equivalent dietary supplement*. B. Wait 30 minutes. If result is 71 mg/dl to 130 mg/dl: C. Retest patients capillary blood glucose (CBG). D. If result greater than or equal to 110 mg/dl, proceed with HBO. If result less than 110 mg/dl, notify  HBO physician and consider holding HBO. If result is 131 mg/dl to 249 mg/dl: A. Proceed with HBO. A. Notify HBO physician and await physician orders. B. It is recommended to hold HBO and do If result is 250 mg/dl or greater: blood/urine ketone testing. C. If the result meets the hospital definition of a critical result, follow hospital policy. POST-HBO GLYCEMIA INTERVENTIONS ACTION INTERVENTION Obtain post HBO capillary blood glucose (ensure 1 physician order is in chart). A. Notify HBO physician and await physician orders. 2 If result is 70 mg/dl or below: B. If the result meets the hospital definition of a [name], [name] ([id number]) [phone].pdf Page 6 of 12 critical result, follow hospital policy. A. Give patient an 8 ounce Glucerna Shake, an 8 ounce Ensure, or 8 ounces of a Glucerna/Ensure equivalent dietary supplement*. B. Wait 15 minutes for symptoms of If result is 71 mg/dl to 100 mg/dl: hypoglycemia (i.e. nervousness, anxiety, sweating, chills, clamminess, irritability, confusion, tachycardia or dizziness).  C. If patient asymptomatic, discharge patient. If patient symptomatic, repeat capillary blood glucose (CBG) and notify HBO physician. If result is 101 mg/dl to 249 mg/dl: A. Discharge patient. A. Notify HBO physician and await physician orders. B. It is recommended to do blood/urine ketone If result is 250 mg/dl or greater: testing. C. If the result meets the hospital definition of a critical result, follow hospital policy. *Juice or candies are NOT equivalent products. If patient refuses the Glucerna or Ensure, please consult the hospital dietitian for an appropriate substitute. Electronic Signature(s) Signed: 10/06/2022 12:04:38 PM By: [name] Maudlin MD FACS Entered By: [name] on 10/06/2022 11:41:45 -------------------------------------------------------------------------------- Problem List Details Patient Name:  Date of Service: [place], [name] 10/06/2022 10:15 A M Medical Record Number: [id number] Patient Account Number: [id number] Date of Birth/Sex: Treating RN: [birth year] (66 y.o. 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 10/06/2022 10:15 A M Medical Record Number: [id number] Patient Account Number: [id number] Date of Birth/Sex: Treating RN: 10/01/1956 (66 y.o. [name] Primary Care Provider: Nelda [name] Other Clinician: Referring Provider: Treating Provider/Extender: [name] in Treatment: 3 Subjective Chief Complaint Information obtained from Patient Patients presents for treatment of an open diabetic ulcer and evaluation for hyperbaric oxygen therapy History of Present Illness (HPI) ADMISSION 09/10/2022 This is a 66 year old poorly controlled type II diabetic (last hemoglobin A1c 10.8%) who has had an ulcer on his heel  for over 3 years. He has been seen in multiple wound care centers, including [facility] and [facility]. He reports that at least 3 doctors have recommended that he undergo below-knee amputation. He most recently met with Dr. Catalina Gravel, a vascular surgeon affiliated with [facility]. 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 Unfortunately, the patient has some extenuating social circumstances including the fact that he cares for his wife who has stage IV colon cancer and still works, driving vehicles for [employer]. 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 He was recently hospitalized at [facility] for sepsis and currently has a PICC line through which he receives IV antibiotics. He reports having had another MRI during that hospital stay along with a chest x-ray and EKG. He apparently contacted one of the hyperbaric therapy techs here and asked a number of questions about hyperbaric oxygen treatments. He subsequently self-referred to our center to undergo further evaluation and management. I [name] to him that [facility] actually has hyperbaric chambers, but he states that he lives in [place] and this would be more convenient for him given the intensive nature of the therapy and time requirement. ABI in clinic today was  0.94. The patient actually has 2 wounds. There is a wound on the dorsum of his left foot with heavy black eschar and slough present. After debridement, this was demonstrated to involve the muscle and the extensor tendons are exposed. On his heel, there is essentially a "shark bite" type wound, with much of the heel fat pad absent. The muscle layer is exposed. There is blue-green staining around the perimeter of the wound, but no significant odor. He says he has been applying collagen to the wound on his heel and Silvadene and Betadine to the wound on his dorsal  foot. 09/20/2022: The heel wound is quite macerated with wet periwound callus. There is slough accumulation on the surface. The dorsal foot wound looks better this week. There is still exposed tendon, but it is fairly clean with just a little biofilm buildup. We are still working on gathering the required documentation to submit for pretreatment review for hyperbaric oxygen therapy. 09/29/2022: The dorsal foot wound continues to improve. I do not see any exposed tendon at this point. There is just some slough accumulation on the wound surface. He continues to have very wet macerated periwound callus on his heel. There is slough on the surface, but it is loose and thin. There is an area of undermining at the 11 o'clock position, but the overlying skin and subcutaneous tissue is healthy and viable. He has been approved for hyperbaric oxygen therapy and will start treatment tomorrow. 10/06/2022: Continued contraction and improvement of the dorsal foot wound. There is just a little bit of slough accumulation on the wound surface. The periwound callus continues to accumulate on the heel wound and it is quite macerated. It is also persistently found with blue-green staining present. He did initiate his hyperbaric oxygen therapy, but has had significant difficulty with decompression. He will be going to ENT to have PE tubes placed. Patient History Family History Cancer - Father,Mother, Diabetes - Mother,Father, Lung Disease - Father, Thyroid Problems - Mother, No family history of Heart Disease, Hereditary Spherocytosis, Hypertension, Kidney Disease, Seizures, Stroke, Tuberculosis. Social History Never smoker, Marital Status - Married, Alcohol Use - Never, Drug Use - No History, Caffeine Use - Daily. Medical History Eyes Patient has history of Cataracts Denies history of Glaucoma, Optic Neuritis Ear/Nose/Mouth/Throat Denies history of Chronic sinus problems/congestion, Middle ear  problems Respiratory Patient has history of Sleep Apnea Cardiovascular Patient has history of Hypertension Gastrointestinal Denies history of Cirrhosis , Colitis, Crohnoos, Hepatitis A, Hepatitis B, Hepatitis C Endocrine Patient has history of Type II Diabetes Immunological Denies history of Lupus Erythematosus, Raynaudoos, Scleroderma Musculoskeletal Patient has history of Osteomyelitis - 2023 Neurologic Patient has history of Neuropathy - Bila lower extremities Oncologic Denies history of Received Chemotherapy, Received Radiation [name], [name] ([id number]) [id number] Page 8 of 12 Psychiatric Denies history of Anorexia/bulimia, Confinement Anxiety Hospitalization/Surgery History - I and D Left calcaneus. - back surgery- laminectomy. - eye surgery- Bila cataracts. - shoulder arthroscopy. Medical A Surgical History Notes nd Cardiovascular hyperlipidemia Genitourinary AKI Objective Constitutional Slightly hypertensive. No acute distress.. Vitals Time Taken: 10:43 AM, Height: 74 in, Weight: 245 lbs, BMI: 31.5, Temperature: 98.7 F, Pulse: 89 bpm, Respiratory Rate: 16 breaths/min, Blood Pressure: 144/82 mmHg, Capillary Blood Glucose: 308 mg/dl. Respiratory Normal work of breathing on room air.. General Notes: 10/06/2022: Continued contraction and improvement of the dorsal foot wound. There is just a little bit of slough accumulation on the wound surface. The periwound callus continues to accumulate on the heel wound and it is  quite macerated. It is also persistently found with blue-green staining present Integumentary (Hair, Skin) Wound #1 status is Open. Original cause of wound was Pressure Injury. The date acquired was: 03/09/2019. The wound has been in treatment 3 weeks. The wound is located on the Left Calcaneus. The wound measures 5.4cm length x 4.9cm width x 0.3cm depth; 20.782cm^2 area and 6.234cm^3 volume. There is Fat Layer (Subcutaneous  Tissue) exposed. There is no tunneling noted, however, there is undermining starting at 6:00 and ending at 1:00 with a maximum distance of 1.6cm. There is a medium amount of serosanguineous drainage noted. The wound margin is fibrotic, thickened scar. There is medium (34-66%) red, pink granulation within the wound bed. There is a medium (34-66%) amount of necrotic tissue within the wound bed including Eschar and Adherent Slough. The periwound skin appearance exhibited: Callus, Maceration. The periwound skin appearance did not exhibit: Dry/Scaly. Wound #2 status is Open. Original cause of wound was Pressure Injury. The date acquired was: 07/09/2022. The wound has been in treatment 3 weeks. The wound is located on the Left,Dorsal Foot. The wound measures 3.5cm length x 2.6cm width x 0.1cm depth; 7.147cm^2 area and 0.715cm^3 volume. There is Fat Layer (Subcutaneous Tissue) exposed. There is no tunneling or undermining noted. There is a medium amount of serosanguineous drainage noted. There is medium (34-66%) pink granulation within the wound bed. There is a medium (34-66%) amount of necrotic tissue within the wound bed including Eschar and Adherent Slough. The periwound skin appearance had no abnormalities noted for color. The periwound skin appearance did not exhibit: Maceration. Periwound temperature was noted as No Abnormality. 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 There was a Excisional Skin/Subcutaneous Tissue Debridement with  a total area of 26.46 sq cm performed by [name] Maudlin, MD. With the following instrument(s): Curette to remove Non-Viable tissue/material. Material removed includes Callus, Subcutaneous Tissue, and Slough. 1 specimen was taken by a Tissue Culture and sent to the lab per facility protocol. A time out was conducted at 11:05, prior to the start of the procedure. A Minimum amount of bleeding was controlled with Pressure. The procedure was tolerated well with a pain level of 0 throughout and a pain level of 0 following the procedure. Post Debridement Measurements: 5.4cm length x 4.9cm width x 0.3cm depth; 6.234cm^3 volume. Character of Wound/Ulcer Post Debridement is improved. Severity of Tissue Post Debridement is: Fat layer exposed. Post procedure Diagnosis Wound #1: Same as Pre-Procedure General Notes: Scribed for [name] by [name] Gouty, RN. 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 There was a Excisional Skin/Subcutaneous Tissue Debridement with a total area of 9.1 sq cm performed by [name] Maudlin, MD. With the following instrument(s): Curette to remove Viable and Non-Viable tissue/material. Material removed includes Subcutaneous Tissue and Slough and. No [name], [name] ([id number]) [id number] Page 9 of 12 specimens were taken. A time out was conducted at 11:05, prior to the start of the procedure. A Minimum amount  of bleeding was controlled with Pressure. The procedure was tolerated well with a pain level of 0 throughout and a pain level of 0 following the procedure. Post Debridement Measurements: 3.5cm length x 2.6cm width x 0.1cm depth; 0.715cm^3 volume. Character of Wound/Ulcer Post Debridement is improved. Severity of Tissue Post Debridement is: Fat layer exposed. Post procedure Diagnosis Wound #2: Same as  Pre-Procedure General Notes: Scribed for [name] by [name] Gouty, RN. Plan Follow-up Appointments: Return Appointment in 1 week. - [name] Rm 4 Bathing/ Shower/ Hygiene: May shower with protection but do not get wound dressing(s) wet. Edema Control - Lymphedema / SCD / Other: Elevate legs to the level of the heart or above for 30 minutes daily and/or when sitting, a frequency of: Avoid standing for long periods of time. Hyperbaric Oxygen Therapy: Wound #1 Left Calcaneus: Evaluate for HBO Therapy Indication: - chronic refractory osteomyelitis If appropriate for treatment, begin HBOT per protocol: 2.5 ATA for 90 Minutes with 2 Five (5) Minute Air Breaks T Number of Treatments: - 40 otal One treatments per day (delivered Monday through Friday unless otherwise specified in Special Instructions below): Finger stick Blood Glucose Pre- and Post- HBOT Treatment. Follow Hyperbaric Oxygen Glycemia Protocol Afrin (Oxymetazoline HCL) 0.05% nasal spray - 1 spray in both nostrils daily as needed prior to HBO treatment for difficulty clearing ears Laboratory ordered were: Anaerobic culture WOUND #1: - Calcaneus Wound Laterality: Left Cleanser: Soap and Water 1 x Per Day/30 Days Discharge Instructions: May shower and wash wound with dial antibacterial soap and water prior to dressing change. Peri-Wound Care: Sween Lotion (Moisturizing lotion) 1 x Per Day/30 Days Discharge Instructions: Apply moisturizing lotion as directed Topical: Gentamicin 1 x Per Day/30 Days Discharge Instructions: As directed by physician Prim Dressing: KerraCel Ag Gelling Fiber Dressing, 4x5 in (silver alginate) (Generic) 1 x Per Day/30 Days ary Discharge Instructions: Apply silver alginate to wound bed as instructed Secondary Dressing: ABD Pad, 5x9 (Generic) 1 x Per Day/30 Days Discharge Instructions: Apply over primary dressing as directed. Secondary Dressing: Zetuvit Plus Silicone Border Dressing 5x5 (in/in) 1 x  Per Day/30 Days Discharge Instructions: Apply silicone border over primary dressing as directed. Secured With: 70M Medipore Public affairs consultant Surgical T 2x10 (in/yd) 1 x Per Day/30 Days ape Discharge Instructions: Secure with tape as directed. Com pression Wrap: Kerlix Roll 4.5x3.1 (in/yd) (Generic) 1 x Per Day/30 Days Discharge Instructions: Apply Kerlix and Coban compression as directed. Com pression Wrap: Coban Self-Adherent Wrap 4x5 (in/yd) (Generic) 1 x Per Day/30 Days Discharge Instructions: Apply over Kerlix as directed. WOUND #2: - Foot Wound Laterality: Dorsal, Left Cleanser: Soap and Water 1 x Per Day/30 Days Discharge Instructions: May shower and wash wound with dial antibacterial soap and water prior to dressing change. Peri-Wound Care: Sween Lotion (Moisturizing lotion) 1 x Per Day/30 Days Discharge Instructions: Apply moisturizing lotion as directed Topical: Gentamicin 1 x Per Day/30 Days Discharge Instructions: As directed by physician Prim Dressing: KerraCel Ag Gelling Fiber Dressing, 4x5 in (silver alginate) (Generic) 1 x Per Day/30 Days ary Discharge Instructions: Apply silver alginate to wound bed as instructed Secondary Dressing: ALLEVYN Heel 4 1/2in x 5 1/2in / 10.5cm x 13.5cm (Generic) 1 x Per Day/30 Days Discharge Instructions: Apply over primary dressing as directed. Secondary Dressing: Zetuvit Plus Silicone Border Dressing 5x5 (in/in) (Generic) 1 x Per Day/30 Days Discharge Instructions: Apply silicone border over primary dressing as directed. Secured With: 70M Medipore Public affairs consultant Surgical T 2x10 (in/yd) 1 x Per Day/30 Days ape  Discharge Instructions: Secure with tape as directed. Com pression Wrap: Kerlix Roll 4.5x3.1 (in/yd) (Generic) 1 x Per Day/30 Days Discharge Instructions: Apply Kerlix and Coban compression as directed. Com pression Wrap: Coban Self-Adherent Wrap 4x5 (in/yd) (Generic) 1 x Per Day/30 Days Discharge Instructions: Apply over Kerlix as  directed. 10/06/2022: Continued contraction and improvement of the dorsal foot wound. There is just a little bit of slough accumulation on the wound surface. The periwound callus continues to accumulate on the heel wound and it is quite macerated. It is also persistently found with blue-green staining present I used a curette to debride slough from the dorsal wound and slough, callus, and nonviable subcutaneous tissue from the plantar wound. I also took a culture due to the persistence of the blue-green staining despite topical gentamicin use. Once the culture data return, I will tailor antibiotic therapy appropriately. For now, continue to topical gentamicin with silver alginate to both sites. He will have his pressure equalization tubes placed tomorrow and then can hopefully resume his hyperbaric oxygen therapy. Electronic Signature(s) Signed: 10/06/2022 11:43:09 AM By: [name] Maudlin MD FACS Entered By: [name] on 10/06/2022 11:43:09 [name], [name] ([id number]) [id number] Page 10 of 12 -------------------------------------------------------------------------------- HxROS Details Patient Name: Date of Service: [place], [name] 10/06/2022 10:15 A M Medical Record Number: [id number] Patient Account Number: [id number] Date of Birth/Sex: Treating RN: [birth year] (66 y.o. [name] Primary Care Provider: Nelda [name] Other Clinician: Referring Provider: Treating Provider/Extender: [name] in Treatment: 3 Eyes Medical History: Positive for: Cataracts Negative for: Glaucoma; Optic Neuritis Ear/Nose/Mouth/Throat Medical History: Negative for: Chronic sinus problems/congestion; Middle ear problems Respiratory Medical History: Positive for: Sleep Apnea Cardiovascular Medical History: Positive for: Hypertension Past Medical History Notes: hyperlipidemia Gastrointestinal Medical History: Negative for:  Cirrhosis ; Colitis; Crohns; Hepatitis A; Hepatitis B; Hepatitis C Endocrine Medical History: Positive for: Type II Diabetes Time with diabetes: 20 years Treated with: Insulin Blood sugar tested every day: Yes Tested : 2-3 Genitourinary Medical History: Past Medical History Notes: AKI Immunological Medical History: Negative for: Lupus Erythematosus; Raynauds; Scleroderma Musculoskeletal Medical History: Positive for: Osteomyelitis - 2023 Neurologic Medical History: Positive for: Neuropathy - Bila lower extremities Oncologic Medical History: Negative for: Received Chemotherapy; Received Radiation [name], [name] ([id number]) [id number] Page 11 of 12 Psychiatric Medical History: Negative for: Anorexia/bulimia; Confinement Anxiety HBO Extended History Items Eyes: Cataracts Immunizations Pneumococcal Vaccine: Received Pneumococcal Vaccination: Yes Received Pneumococcal Vaccination On or After 60th Birthday: Yes Implantable Devices Yes Hospitalization / Surgery History Type of Hospitalization/Surgery I and D Left calcaneus back surgery- laminectomy eye surgery- Bila cataracts shoulder arthroscopy Family and Social History Cancer: Yes - Father,Mother; Diabetes: Yes - Mother,Father; Heart Disease: No; Hereditary Spherocytosis: No; Hypertension: No; Kidney Disease: No; Lung Disease: Yes - Father; Seizures: No; Stroke: No; Thyroid Problems: Yes - Mother; Tuberculosis: No; Never smoker; Marital Status - Married; Alcohol Use: Never; Drug Use: No History; Caffeine Use: Daily; Financial Concerns: No; Food, Clothing or Shelter Needs: No; Support System Lacking: No; Transportation Concerns: No Engineer, maintenance) Signed: 10/06/2022 12:04:38 PM By: [name] Maudlin MD FACS Signed: 10/07/2022 11:56:39 AM By: Dellie Catholic RN Entered By: [name] on 10/06/2022  11:40:48 -------------------------------------------------------------------------------- SuperBill Details Patient Name: Date of Service: [place], [name] 10/06/2022 Medical Record Number: [id number] Patient Account Number: [id number] Date of Birth/Sex: Treating RN: 1956/07/25 (66 y.o. 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 Facility Procedures : ROMEY, MATHIESON Code: [id number] Daiva Eves ([id number]) [id number] 11 I Description: 11042 - DEB SUBQ TISSUE 20 SQ CM/< ICD-10 Diagnosis Description 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  [id number] - DEB SUBQ TISS EA ADDL 20CM CD-10 Diagnosis Description 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 Modifier: [id number] Quantity: 1 27.pdf Page 12 of 12 Physician Procedures : CPT4 Code Description Modifier [id number] - WC PHYS LEVEL 4 - EST PT 25 ICD-10 Diagnosis Description 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 E11.621 Type 2 diabetes mellitus with foot ulcer 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, left ankle and foot Quantity: 1 : [id number] 11042 - WC  PHYS SUBQ TISS 20 SQ CM ICD-10 Diagnosis Description 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

## 2022-10-07 NOTE — Progress Notes (Addendum)
ABOUBACAR, MATSUO R ([id number]) [id number] Page 1 of 10 Visit Report for 10/06/2022 Arrival Information Details Patient Name: Date of Service: [name], [name] [name] 10/06/2022 10:15 A M Medical Record Number: [id number] Patient Account Number: [id number] Date of Birth/Sex: Treating RN: February 06, 1956 (66 y.o. [name] Primary Care Dymphna Wadley: Foye [name] Other Clinician: Referring Najee Cowens: Treating Arrington Bencomo/Extender: Kandis Cocking in Treatment: 3 Visit Information History Since Last Visit Added or deleted any medications: No Patient Arrived: Knee Scooter Any new allergies or adverse reactions: No Arrival Time: 10:42 Had a fall or experienced change in No Accompanied By: self activities of daily living that may affect Transfer Assistance: None risk of [name]: Patient Requires Transmission-Based Precautions: No Signs or symptoms of abuse/neglect since last visito No Patient Has Alerts: No Hospitalized since last visit: No Implantable device outside of the clinic excluding No cellular tissue based products placed in the center since last visit: Has Dressing in Place as Prescribed: Yes Pain Present Now: No Electronic Signature(s) Signed: 10/06/2022 4:57:50 PM By: Tommie Ard RN Entered By: Tommie Ard on 10/06/2022 10:43:40 -------------------------------------------------------------------------------- Encounter Discharge Information Details Patient Name: Date of Service: [name], [name] NA LD R. 10/06/2022 10:15 A M Medical Record Number: [id number] Patient Account Number: [id number] Date of Birth/Sex: Treating RN: 04-Dec-1956 (66 y.o. [name] Primary Care Susi Goslin: Foye [name] Other Clinician: Referring Bruchy Mikel: Treating Joseline Mccampbell/Extender: Kandis Cocking in Treatment: 3 Encounter Discharge Information Items Post Procedure Vitals Discharge Condition: Stable Temperature (F):  98.7 Ambulatory Status: Knee Scooter Pulse (bpm): 89 Discharge Destination: Home Respiratory Rate (breaths/min): 16 Transportation: Private Auto Blood Pressure (mmHg): 144/82 Accompanied By: self Schedule Follow-up Appointment: Yes Clinical Summary of Care: Electronic Signature(s) Signed: 10/06/2022 4:57:50 PM By: Tommie Ard RN Entered By: Tommie Ard on 10/06/2022 11:27:06 [name] ([id number]) [id number] Page 2 of 10 -------------------------------------------------------------------------------- Lower Extremity Assessment Details Patient Name: Date of Service: [place], [name] [name] 10/06/2022 10:15 A M Medical Record Number: [id number] Patient Account Number: [id number] Date of Birth/Sex: Treating RN: 06-05-1956 (66 y.o. [name] Primary Care Zohar Laing: Foye [name] Other Clinician: Referring Jahdiel Krol: Treating Tenita Cue/Extender: Kandis Cocking in Treatment: 3 Edema Assessment Assessed: Kyra Searles: No] [Right: No] [Left: Edema] [Right: :] Calf Left: Right: Point of Measurement: From Medial Instep 47 cm Ankle Left: Right: Point of Measurement: From Medial Instep 27.5 cm Vascular Assessment Pulses: Dorsalis Pedis Palpable: [Left:Yes] Electronic Signature(s) Signed: 10/06/2022 4:57:50 PM By: Tommie Ard RN Entered By: Tommie Ard on 10/06/2022 10:45:49 -------------------------------------------------------------------------------- Multi Wound Chart Details Patient Name: Date of Service: [name], [name] NA LD R. 10/06/2022 10:15 A M Medical Record Number: [id number] Patient Account Number: [id number] Date of Birth/Sex: Treating RN: Dec 07, 1956 (66 y.o. Dianna Limbo Primary Care Junior Kenedy: Foye [name] Other Clinician: Referring Drinda Belgard: Treating Nerissa Constantin/Extender: Kandis Cocking in Treatment: 3 Vital Signs Height(in): 74 Capillary Blood Glucose(mg/dl):  536 Weight(lbs): 644 Pulse(bpm): 89 Body Mass Index(BMI): 31.5 Blood Pressure(mmHg): 144/82 Temperature(F): 98.7 Respiratory Rate(breaths/min): 16 [1:Photos:] [N/A:N/A [id number] Page 3 of 10] Left Calcaneus Left, Dorsal Foot N/A Wound Location: Pressure Injury Pressure Injury N/A Wounding Event: Diabetic Wound/Ulcer of the Lower Diabetic Wound/Ulcer of the Lower N/A Primary Etiology: Extremity Extremity Cataracts, Sleep Apnea, Hypertension, Cataracts, Sleep Apnea, Hypertension, N/A Comorbid History: Type II Diabetes, Osteomyelitis, Type II Diabetes, Osteomyelitis, Neuropathy Neuropathy 03/09/2019 07/09/2022 N/A Date Acquired: 3 3 N/A Weeks of Treatment: Open Open N/A Wound Status: No No N/A Wound Recurrence: 5.4x4.9x0.3 3.5x2.6x0.1 N/A Measurements L x W x  D (cm) 20.782 7.147 N/A A (cm) : rea 6.234 0.715 N/A Volume (cm) : 3.80% 24.20% N/A % Reduction in A rea: 71.10% 24.10% N/A % Reduction in Volume: 6 Starting Position 1 (o'clock): 1 Ending Position 1 (o'clock): 1.6 Maximum Distance 1 (cm): Yes No N/A Undermining: Grade 3 Grade 2 N/A Classification: Medium Medium N/A Exudate A mount: Serosanguineous Serosanguineous N/A Exudate Type: red, brown red, brown N/A Exudate Color: Fibrotic scar, thickened scar N/A N/A Wound Margin: Medium (34-66%) Medium (34-66%) N/A Granulation A mount: Red, Pink Pink N/A Granulation Quality: Medium (34-66%) Medium (34-66%) N/A Necrotic A mount: Eschar, Adherent Slough Eschar, Adherent Slough N/A Necrotic Tissue: Fat Layer (Subcutaneous Tissue): Yes Fat Layer (Subcutaneous Tissue): Yes N/A Exposed Structures: Fascia: No Fascia: No Tendon: No Tendon: No Muscle: No Muscle: No Joint: No Joint: No Bone: No Bone: No Small (1-33%) Small (1-33%) N/A Epithelialization: Debridement - Excisional Debridement - Excisional N/A Debridement: Pre-procedure Verification/Time Out 11:05 11:05  N/A Taken: Callus, Subcutaneous, Slough Subcutaneous, Slough N/A Tissue Debrided: Skin/Subcutaneous Tissue Skin/Subcutaneous Tissue N/A Level: 26.46 9.1 N/A Debridement A (sq cm): rea Curette Curette N/A Instrument: Minimum Minimum N/A Bleeding: Pressure Pressure N/A Hemostasis A chieved: 0 0 N/A Procedural Pain: 0 0 N/A Post Procedural Pain: Procedure was tolerated well Procedure was tolerated well N/A Debridement Treatment Response: 5.4x4.9x0.3 3.5x2.6x0.1 N/A Post Debridement Measurements L x W x D (cm) 6.234 0.715 N/A Post Debridement Volume: (cm) Callus: Yes N/A Periwound Skin Texture: Maceration: Yes Maceration: No N/A Periwound Skin Moisture: Dry/Scaly: No Erythema: Yes N/A Periwound Skin Color: N/A No Abnormality N/A Temperature: Debridement Debridement N/A Procedures Performed: Treatment Notes Wound #1 (Calcaneus) Wound Laterality: Left Cleanser Soap and Water Discharge Instruction: May shower and wash wound with dial antibacterial soap and water prior to dressing change. Peri-Wound Care Sween Lotion (Moisturizing lotion) Discharge Instruction: Apply moisturizing lotion as directed Topical Gentamicin Discharge Instruction: As directed by physician Primary Dressing KerraCel Ag Gelling Fiber Dressing, 4x5 in (silver alginate) Discharge Instruction: Apply silver alginate to wound bed as instructed Secondary Dressing ABD Pad, 5x9 Discharge Instruction: Apply over primary dressing as directed. CAYTON, CUEVAS R ([id number]) [id number] Page 4 of 10 Zetuvit Plus Silicone Border Dressing 5x5 (in/in) Discharge Instruction: Apply silicone border over primary dressing as directed. Secured With [employer] T 2x10 (in/yd) ape Discharge Instruction: Secure with tape as directed. Compression Wrap Kerlix Roll 4.5x3.1 (in/yd) Discharge Instruction: Apply Kerlix and Coban compression as directed. Coban Self-Adherent  Wrap 4x5 (in/yd) Discharge Instruction: Apply over Kerlix as directed. Compression Stockings Add-Ons Wound #2 (Foot) Wound Laterality: Dorsal, Left Cleanser Soap and Water Discharge Instruction: May shower and wash wound with dial antibacterial soap and water prior to dressing change. Peri-Wound Care Sween Lotion (Moisturizing lotion) Discharge Instruction: Apply moisturizing lotion as directed Topical Gentamicin Discharge Instruction: As directed by physician Primary Dressing KerraCel Ag Gelling Fiber Dressing, 4x5 in (silver alginate) Discharge Instruction: Apply silver alginate to wound bed as instructed Secondary Dressing ALLEVYN Heel 4 1/2in x 5 1/2in / 10.5cm x 13.5cm Discharge Instruction: Apply over primary dressing as directed. Zetuvit Plus Silicone Border Dressing 5x5 (in/in) Discharge Instruction: Apply silicone border over primary dressing as directed. Secured With [employer] T 2x10 (in/yd) ape Discharge Instruction: Secure with tape as directed. Compression Wrap Kerlix Roll 4.5x3.1 (in/yd) Discharge Instruction: Apply Kerlix and Coban compression as directed. Coban Self-Adherent Wrap 4x5 (in/yd) Discharge Instruction: Apply over Kerlix as directed. Compression Stockings Add-Ons Electronic Signature(s) Signed: 10/06/2022 11:36:08 AM By: Lady Gary,  Victorino Dike MD FACS Signed: 10/07/2022 11:56:39 AM By: Karie Schwalbe RN Entered By: [name] on 10/06/2022 11:36:08 -------------------------------------------------------------------------------- Multi-Disciplinary Care Plan Details Patient Name: Date of Service: [name], [name] NA LD R. 10/06/2022 10:15 A M Medical Record Number: [id number] Patient Account Number: [id number] REID, REGAS ([id number]) [id number].pdf Page 5 of 10 Date of Birth/Sex: Treating RN: 28-Jan-1956 (66 y.o. 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 and treatment procedures Potential for barotraumas to ears, sinuses, teeth, and lungs or cerebral gas embolism related to changes in atmospheric pressure inside hyperbaric oxygen chamber Potential for oxygen toxicity seizures related to delivery of 100% oxygen at an increased atmospheric pressure Potential for pulmonary oxygen toxicity related to delivery of 100% oxygen at an increased atmospheric pressure Goals: Barotrauma will be prevented during HBO2 Date Initiated: 10/06/2022 Target Resolution Date: 11/03/2022 Goal Status: Active Patient and/or family will be able to state/discuss factors appropriate to the management of their disease process during treatment Date Initiated: 10/06/2022 Target Resolution Date: 11/03/2022 Goal Status: Active Patient will tolerate the hyperbaric oxygen therapy treatment Date Initiated: 10/06/2022 Target Resolution Date: 11/03/2022 Goal Status: Active Patient/caregiver will verbalize understanding of HBO goals, rationale, procedures and potential hazards Date Initiated: 10/06/2022 Target Resolution Date: 11/03/2022 Goal Status: Active Interventions: Administer decongestants, per physician orders, prior to HBO2 Administer the correct therapeutic gas delivery based on the patients needs and limitations, per physician order Assess and provide for patients comfort related to the hyperbaric environment and equalization of middle ear Assess for signs and symptoms related to adverse events, including but not limited to confinement anxiety, pneumothorax, oxygen toxicity and baurotrauma Notes: Wound/Skin Impairment Nursing Diagnoses: Impaired tissue integrity Goals: Patient/caregiver will verbalize  understanding of skin care regimen Date Initiated: 10/06/2022 Target Resolution Date: 11/03/2022 Goal Status: Active Ulcer/skin breakdown will have a volume reduction of 30% by week 4 Date Initiated: 09/10/2022 Date Inactivated: 10/06/2022 Target Resolution Date: 10/08/2022 Goal Status: Unmet Unmet Reason: osteo, HBOT Ulcer/skin breakdown will have a volume reduction of 50% by week 8 Date Initiated: 10/06/2022 Target Resolution Date: 11/03/2022 Goal Status: Active Interventions: Assess ulceration(s) every visit Provide education on ulcer and skin care Treatment Activities: Consult for HBO : 09/10/2022 Skin care regimen initiated : 09/10/2022 Notes: Electronic Signature(s) Signed: 10/06/2022 6:06:53 PM By: Zenaida Deed RN, BSN Entered By: Zenaida Deed on 10/06/2022 12:54:53 [name] ([id number]) [id number] Page 6 of 10 -------------------------------------------------------------------------------- Pain Assessment Details Patient Name: Date of Service: [name], [name] [name] 10/06/2022 10:15 A M Medical Record Number: [id number] Patient Account Number: [id number] Date of Birth/Sex: Treating RN: 08/12/1956 (66 y.o. [name] Primary Care [place]vante Knee: Foye [name] Other Clinician: Referring Pippa Hanif: Treating Tylan Kinn/Extender: Kandis Cocking in Treatment: 3 Active Problems Location of Pain Severity and Description of Pain Patient Has Paino No Site Locations Pain Management and Medication Current Pain Management: Electronic Signature(s) Signed: 10/06/2022 4:57:50 PM By: Tommie Ard RN Entered By: Tommie Ard on 10/06/2022 10:44:22 -------------------------------------------------------------------------------- Patient/Caregiver Education Details Patient Name: Date of Service: [name] NA LD R. 10/25/2023andnbsp10:15 A M Medical Record Number: [id number] Patient Account Number: [id number] Date of  Birth/Gender: Treating RN: January 27, 1956 (66 y.o. [name] Primary Care Physician: Foye [name] Other Clinician: Referring Physician: Treating Physician/Extender: Kandis Cocking in Treatment: 3 Education Assessment Education Provided To:  Patient Education Topics Provided Hyperbaric Oxygenation: Methods: Explain/Verbal Responses: Reinforcements needed, State content correctly Offloading: Methods: Explain/Verbal Responses: Reinforcements needed, State content correctly Vernice JeffersonSMITH, Bradan R ([id number]) [id number] Page 7 of 10 Wound/Skin Impairment: Methods: Explain/Verbal Responses: Reinforcements needed, State content correctly Electronic Signature(s) Signed: 10/06/2022 6:06:53 PM By: Zenaida DeedBoehlein, Linda RN, BSN Entered By: Zenaida DeedBoehlein, Linda on 10/06/2022 12:55:24 -------------------------------------------------------------------------------- Wound Assessment Details Patient Name: Date of Service: [name] R. 10/06/2022 10:15 A M Medical Record Number: [id number] Patient Account Number: [id number] Date of Birth/Sex: Treating RN: 11-05-1956 [age] y.o. [name]) Zochol, Jamie Primary Care Chen Saadeh: Foye DeerSchultz, Douglas Other Clinician: Referring Tamika Nou: Treating Leeroy Lovings/Extender: Kandis Cockingannon, Jennifer Schultz, Douglas Weeks in Treatment: 3 Wound Status Wound Number: 1 Primary Diabetic Wound/Ulcer of the Lower Extremity Etiology: Wound Location: Left Calcaneus Wound Open Wounding Event: Pressure Injury Status: Date Acquired: 03/09/2019 Comorbid Cataracts, Sleep Apnea, Hypertension, Type II Diabetes, Weeks Of Treatment: 3 History: Osteomyelitis, Neuropathy Clustered Wound: No Photos Wound Measurements Length: (cm) 5.4 Width: (cm) 4.9 Depth: (cm) 0.3 Area: (cm) 20.782 Volume: (cm) 6.234 % Reduction in Area: 3.8% % Reduction in Volume: 71.1% Epithelialization: Small (1-33%) Tunneling: No Undermining: Yes Starting Position  (o'clock): 6 Ending Position (o'clock): 1 Maximum Distance: (cm) 1.6 Wound Description Classification: Grade 3 Wound Margin: Fibrotic scar, thickened scar Exudate Amount: Medium Exudate Type: Serosanguineous Exudate Color: red, brown Foul Odor After Cleansing: No Slough/Fibrino Yes Wound Bed Granulation Amount: Medium (34-66%) Exposed Structure Granulation Quality: Red, Pink Fascia Exposed: No Necrotic Amount: Medium (34-66%) Fat Layer (Subcutaneous Tissue) Exposed: Yes Necrotic Quality: Eschar, Adherent Slough Tendon Exposed: No Muscle Exposed: No [name], Pershing R ([id number]) [id number] Page 8 of 10 Joint Exposed: No Bone Exposed: No Periwound Skin Texture Texture Color No Abnormalities Noted: No No Abnormalities Noted: No Callus: Yes Moisture No Abnormalities Noted: No Dry / Scaly: No Maceration: Yes Treatment Notes Wound #1 (Calcaneus) Wound Laterality: Left Cleanser Soap and Water Discharge Instruction: May shower and wash wound with dial antibacterial soap and water prior to dressing change. Peri-Wound Care Sween Lotion (Moisturizing lotion) Discharge Instruction: Apply moisturizing lotion as directed Topical Gentamicin Discharge Instruction: As directed by physician Primary Dressing KerraCel Ag Gelling Fiber Dressing, 4x5 in (silver alginate) Discharge Instruction: Apply silver alginate to wound bed as instructed Secondary Dressing ABD Pad, 5x9 Discharge Instruction: Apply over primary dressing as directed. Zetuvit Plus Silicone Border Dressing 5x5 (in/in) Discharge Instruction: Apply silicone border over primary dressing as directed. Secured With [employer] Soft Cloth Surgical T 2x10 (in/yd) ape Discharge Instruction: Secure with tape as directed. Compression Wrap Kerlix Roll 4.5x3.1 (in/yd) Discharge Instruction: Apply Kerlix and Coban compression as directed. Coban Self-Adherent Wrap 4x5 (in/yd) Discharge Instruction:  Apply over Kerlix as directed. Compression Stockings Add-Ons Electronic Signature(s) Signed: 10/06/2022 4:57:50 PM By: Tommie ArdZochol, Jamie RN Entered By: Tommie ArdZochol, Jamie on 10/06/2022 10:49:39 -------------------------------------------------------------------------------- Wound Assessment Details Patient Name: Date of Service: [place] NA LD R. 10/06/2022 10:15 A M Medical Record Number: [id number] Patient Account Number: [id number] Date of Birth/Sex: Treating RN: 11-05-1956 [age] y.o. [name]) Zochol, Jamie Primary Care Demon Volante: Foye DeerSchultz, Douglas Other Clinician: Referring Venkat Ankney: Treating Ladarren Steiner/Extender: Kandis Cockingannon, Jennifer Schultz, Douglas Weeks in Treatment: [address] Kenedy, PaceRONALD R ([id number]) [id number] Page 9 of 10 Wound Status Wound Number: 2 Primary Diabetic Wound/Ulcer of the Lower Extremity Etiology: Wound Location: Left, Dorsal Foot Wound Open Wounding Event: Pressure Injury Status: Date Acquired: 07/09/2022 Comorbid Cataracts, Sleep Apnea, Hypertension, Type II Diabetes, Weeks Of Treatment: 3 History: Osteomyelitis, Neuropathy Clustered Wound: No Photos Wound Measurements Length: (cm) 3.5 Width: (cm)  2.6 Depth: (cm) 0.1 Area: (cm) 7.147 Volume: (cm) 0.715 % Reduction in Area: 24.2% % Reduction in Volume: 24.1% Epithelialization: Small (1-33%) Tunneling: No Undermining: No Wound Description Classification: Grade 2 Exudate Amount: Medium Exudate Type: Serosanguineous Exudate Color: red, brown Foul Odor After Cleansing: No Slough/Fibrino Yes Wound Bed Granulation Amount: Medium (34-66%) Exposed Structure Granulation Quality: Pink Fascia Exposed: No Necrotic Amount: Medium (34-66%) Fat Layer (Subcutaneous Tissue) Exposed: Yes Necrotic Quality: Eschar, Adherent Slough Tendon Exposed: No Muscle Exposed: No Joint Exposed: No Bone Exposed: No Periwound Skin Texture Texture Color No Abnormalities Noted: No No Abnormalities Noted:  Yes Moisture Temperature / Pain No Abnormalities Noted: No Temperature: No Abnormality Maceration: No Treatment Notes Wound #2 (Foot) Wound Laterality: Dorsal, Left Cleanser Soap and Water Discharge Instruction: May shower and wash wound with dial antibacterial soap and water prior to dressing change. Peri-Wound Care Sween Lotion (Moisturizing lotion) Discharge Instruction: Apply moisturizing lotion as directed Topical Gentamicin Discharge Instruction: As directed by physician Primary Dressing KerraCel Ag Gelling Fiber Dressing, 4x5 in (silver alginate) Discharge Instruction: Apply silver alginate to wound bed as instructed Secondary Dressing MOHANAD, CARSTEN ([id number]) [id number] Page 10 of 10 ALLEVYN Heel 4 1/2in x 5 1/2in / 10.5cm x 13.5cm Discharge Instruction: Apply over primary dressing as directed. Zetuvit Plus Silicone Border Dressing 5x5 (in/in) Discharge Instruction: Apply silicone border over primary dressing as directed. Secured With [employer] Surgical T 2x10 (in/yd) ape Discharge Instruction: Secure with tape as directed. Compression Wrap Kerlix Roll 4.5x3.1 (in/yd) Discharge Instruction: Apply Kerlix and Coban compression as directed. Coban Self-Adherent Wrap 4x5 (in/yd) Discharge Instruction: Apply over Kerlix as directed. Compression Stockings Add-Ons Electronic Signature(s) Signed: 10/06/2022 4:57:50 PM By: Blanche East RN Entered By: Blanche East on 10/06/2022 10:50:15 -------------------------------------------------------------------------------- Vitals Details Patient Name: Date of Service: [name], [name] NA LD R. 10/06/2022 10:15 A M Medical Record Number: [id number] Patient Account Number: [id number] Date of Birth/Sex: Treating RN: 07-10-1956 (66 y.o. [name] Primary Care Brylyn Novakovich: Nelda Bucks Other Clinician: Referring Rhema Boyett: Treating Donnice Nielsen/Extender: Doristine Bosworth  in Treatment: 3 Vital Signs Time Taken: 10:43 Temperature (F): 98.7 Height (in): 74 Pulse (bpm): 89 Weight (lbs): 245 Respiratory Rate (breaths/min): 16 Body Mass Index (BMI): 31.5 Blood Pressure (mmHg): 144/82 Capillary Blood Glucose (mg/dl): 89 Reference Range: 80 - 120 mg / dl Electronic Signature(s) Signed: 10/07/2022 4:24:03 PM By: Adline Peals Previous Signature: 10/06/2022 4:57:50 PM Version By: Blanche East RN Entered By: Adline Peals on 10/07/2022 14:03:21

## 2022-10-08 ENCOUNTER — Encounter (HOSPITAL_BASED_OUTPATIENT_CLINIC_OR_DEPARTMENT_OTHER): Payer: Medicare Other | Admitting: Internal Medicine

## 2022-10-08 DIAGNOSIS — E1165 Type 2 diabetes mellitus with hyperglycemia: Secondary | ICD-10-CM

## 2022-10-08 DIAGNOSIS — E11621 Type 2 diabetes mellitus with foot ulcer: Secondary | ICD-10-CM | POA: Diagnosis not present

## 2022-10-08 DIAGNOSIS — L97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: Secondary | ICD-10-CM

## 2022-10-08 DIAGNOSIS — L97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: Secondary | ICD-10-CM | POA: Diagnosis not present

## 2022-10-08 DIAGNOSIS — M86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, left ankle and foot: Secondary | ICD-10-CM | POA: Diagnosis not present

## 2022-10-08 LAB — GLUCOSE, CAPILLARY
Glucose-Capillary: 300 mg/dL — ABNORMAL HIGH (ref 70–99)
Glucose-Capillary: 277 mg/dL — ABNORMAL HIGH (ref 70–99)

## 2022-10-08 NOTE — Progress Notes (Signed)
SHONTA, PHILLIS R ([id number]) [id number] Page 1 of 2 Visit Report for 10/08/2022 Arrival Information Details Patient Name: Date of Service: [place], [name] 10/08/2022 8:00 A M Medical Record Number: [id number] Patient Account Number: [id number] Date of Birth/Sex: Treating RN: 07-29-1956 (66 y.o. [name], Meta.Reding Primary Care Saraia Platner: Nelda Bucks Other Clinician: Valeria Batman Referring Tiasia Weberg: Treating Ryelan Kazee/Extender: Colin Benton in Treatment: 4 Visit Information History Since Last Visit All ordered tests and consults were completed: Yes Patient Arrived: Knee Scooter Added or deleted any medications: No Arrival Time: 08:06 Any new allergies or adverse reactions: No Accompanied By: None Had a fall or experienced change in No Transfer Assistance: None activities of daily living that may affect Patient Identification Verified: Yes risk of falls: Secondary Verification Process Completed: Yes Signs or symptoms of abuse/neglect since last visito No Patient Requires Transmission-Based Precautions: No Hospitalized since last visit: No Patient Has Alerts: No Implantable device outside of the clinic excluding Yes cellular tissue based products placed in the center since last visit: Pain Present Now: No Electronic Signature(s) Signed: 10/08/2022 1:55:51 PM By: Valeria Batman EMT Previous Signature: 10/08/2022 1:49:26 PM Version By: Valeria Batman EMT Entered By: Valeria Batman on 10/08/2022 13:55:51 -------------------------------------------------------------------------------- Encounter Discharge Information Details Patient Name: Date of Service: [place], [name] 10/08/2022 8:00 A M Medical Record Number: [id number] Patient Account Number: [id number] Date of Birth/Sex: Treating RN: 11-27-1956 (66 y.o. [name], Meta.Reding Primary Care Tenessa Marsee: Nelda Bucks Other Clinician: Valeria Batman Referring  Blandina Renaldo: Treating Tempestt Silba/Extender: Colin Benton in Treatment: 4 Encounter Discharge Information Items Discharge Condition: Stable Ambulatory Status: Knee Scooter Discharge Destination: Home Transportation: Private Auto Accompanied By: None Schedule Follow-up Appointment: Yes Clinical Summary of Care: Electronic Signature(s) Signed: 10/08/2022 2:14:45 PM By: Valeria Batman EMT Entered By: Valeria Batman on 10/08/2022 14:14:45 [name] ([id number]) [id number] Page 2 of 2 -------------------------------------------------------------------------------- Vitals Details Patient Name: Date of Service: [place], [name] 10/08/2022 8:00 A M Medical Record Number: [id number] Patient Account Number: [id number] Date of Birth/Sex: Treating RN: 29-May-1956 (66 y.o. [name], Meta.Reding Primary Care Phenix Grein: Nelda Bucks Other Clinician: Valeria Batman Referring Isla Sabree: Treating Christophor Eick/Extender: Colin Benton in Treatment: 4 Vital Signs Time Taken: 08:30 Temperature (F): 98.6 Height (in): 74 Pulse (bpm): 93 Weight (lbs): 245 Respiratory Rate (breaths/min): 18 Body Mass Index (BMI): 31.5 Blood Pressure (mmHg): 162/81 Capillary Blood Glucose (mg/dl): 300 Reference Range: 80 - 120 mg / dl Electronic Signature(s) Signed: 10/08/2022 1:50:07 PM By: Valeria Batman EMT Entered By: Valeria Batman on 10/08/2022 13:50:07

## 2022-10-08 NOTE — Progress Notes (Signed)
RAMELLO, CORDIAL R ([id number]) [id number] Page 1 of 2 Visit Report for 10/08/2022 Problem List Details Patient Name: Date of Service: [place], [name] 10/08/2022 8:00 A M Medical Record Number: [id number] Patient Account Number: [id number] Date of Birth/Sex: Treating RN: November 05, 1956 (66 y.o. 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 10/08/2022 Medical Record Number: [id number] Patient Account Number: [id number] Date of Birth/Sex: Treating RN: [birth year] (66 y.o. 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 Facility Procedures : CPT4 Code Description: [id number] G0277-(Facility Use Only) HBOT full body chamber, 38min , ICD-10 Diagnosis Description [phone] Other chronic osteomyelitis, 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 L97.424 Non-pressure chronic  ulcer of left heel and midfoot with n L97.523 Non-pressure chronic ulcer of other part of left foot with Modifier: ecrosis of bo necrosis of Quantity: 5 ne muscle Physician Procedures : CPT4 Code Description Modifier [id number] - WC PHYS HYPERBARIC OXYGEN THERAPY ICD-10 Diagnosis Description 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, left ankle and foot E11.65 Type 2 diabetes mellitus with hyperglycemia L97.424 Non-pressure chronic ulcer  of left heel and midfoot with necrosis of bo L97.523 Non-pressure chronic ulcer of other part of left foot with necrosis of Quantity: 1 ne muscle Electronic Signature(s) Signed: 10/08/2022 2:12:30 PM By: Valeria Batman EMT Signed: 10/08/2022 4:15:15 PM By: Kalman Shan DO Entered By: Valeria Batman on 10/08/2022 14:12:30

## 2022-10-08 NOTE — Progress Notes (Addendum)
[name], [name] ([id number]) [id number] Page 1 of 2 Visit Report for 10/08/2022 HBO Details Patient Name: Date of Service: [place], [name] [name]. 10/08/2022 8:00 A M Medical Record Number: [id number] Patient Account Number: [id number] Date of Birth/Sex: Treating RN: 03/02/1956 (66 y.o. 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 HBO Treatment Details Treatment Number: 4 Patient Type: Outpatient Chamber Type: Monoplace Chamber Serial #: [id number] Treatment Protocol: 2.0 ATA with 90 minutes oxygen, with two 5 minute air breaks Treatment Details Compression Rate Down: 1.0 psi / minute De-Compression Rate Up: 1.0 psi / minute A breaks and breathing ir Compress Tx Pressure periods Decompress Decompress Begins Reached (leave unused spaces Begins Ends blank) Chamber Pressure (ATA 1 2 2 2 2 2  --2 1 ) Clock Time (24 hr) 08:35 08:56 09:26 09:31 10:01 10:06 - - 10:37 10:52 Treatment Length: 137 (minutes) Treatment Segments: 5 Vital Signs Capillary Blood Glucose Reference Range: 80 - 120 mg / dl HBO Diabetic Blood Glucose Intervention Range: <131 mg/dl or >249 mg/dl Time Vitals Blood Respiratory Capillary Blood Glucose Pulse Action Type: Pulse: Temperature: Taken: Pressure: Rate: Glucose (mg/dl): Meter #: Oximetry (%) Taken: Pre 08:30 162/81 93 18 98.6 300 Post 11:00 173/81 93 18 98.6 Treatment Response Treatment Toleration: Well Treatment Completion Status: Treatment Completed without Adverse Event Treatment Notes The patient saw Dr. Darene Lamer ENT yesterday for Tympanostomy tube placement right ear.  T eoh oday before treatment was started, I asked Dr. Celine Ahr to check his ears. Dr. Celine Ahr informed of the patient blood sugar of 300. The patient was given 1 spray to each side of nose of Afrin before treatment started. Treatment started. The patient had no problems noted during compression. Decompression the did state that he was having any problems. When i pulled him out of the chamber. I noted a dark yellow drainage coming from his right ear. I call dr. Heber [place] informed her of drainage. Dr. Heber [place] came and saw the patient. Dr. Heber [employer] asked me to call Dr. Darene Lamer office, and ask what we should eoh do. I called Dr. Darene Lamer office and spoke with the RN. She spoke with Dr. Dagmar Hait eoh. Dr. Darene Lamer office to call in RX for Ciprodex drops. eoh I informed the patient to call Dr. Darene Lamer office to see if Dr. Darene Lamer needed to see him. Dr. Heber  informed of same. eoh eoh The patient stated that he understood. Jamilet Ambroise Notes I was notified that patient was having drainage from his right ear status post HBO therapy today. He had yellow drainage on paper towel. Otoscope exam showed ear tube in place however yellow-brown fluid collected behind the tympanic membrane. ENT was notified and ciprofloxacin eardrops were sent to the pharmacy. It was recommended that he follow-up with ENT prior to reinitiation of HBO therapy Physician HBO Attestation: I certify that I supervised this HBO treatment in accordance with Medicare guidelines. A trained emergency response team is readily available per Yes hospital policies and procedures. Continue HBOT as ordered. [address] HELADIO, SETTLEMIRE [name] ([id number]) [id number] Page 2 of 2 Electronic Signature(s) Signed: 10/08/2022 4:15:15 PM By: Kalman Shan DO Previous Signature: 10/08/2022 2:28:57 PM Version By: Valeria Batman EMT Previous Signature: 10/08/2022 2:14:06 PM Version By: Valeria Batman EMT Previous Signature: 10/08/2022 2:11:25 PM Version  By: Valeria Batman  EMT Entered By: Kalman Shan on 10/08/2022 16:14:41 -------------------------------------------------------------------------------- HBO Safety Checklist Details Patient Name: Date of Service: [name], [place] [name]. 10/08/2022 8:00 A M Medical Record Number: [id number] Patient Account Number: [id number] Date of Birth/Sex: Treating RN: July 16, 1956 (66 y.o. [name], Meta.Reding Primary Care [name]: Nelda Bucks Other Clinician: Valeria Batman Referring [name]: Treating [name]/Extender: Colin Benton in Treatment: 4 HBO Safety Checklist Items Safety Checklist Consent Form Signed Patient voided / foley secured and emptied When did you last eato yesterday 2045 Last dose of injectable or oral agent 0637 Ostomy pouch emptied and vented if applicable NA All implantable devices assessed, documented and approved Tympanostomy tube right ear Intravenous access site secured and place NA Valuables secured Linens and cotton and cotton/polyester blend (less than 51% polyester) Personal oil-based products / skin lotions / body lotions removed Wigs or hairpieces removed NA Smoking or tobacco materials removed Books / newspapers / magazines / loose paper removed Cologne, aftershave, perfume and deodorant removed Jewelry removed (may wrap wedding band) Make-up removed NA Hair care products removed Battery operated devices (external) removed Heating patches and chemical warmers removed Titanium eyewear removed NA Nail polish cured greater than 10 hours NA Casting material cured greater than 10 hours NA Hearing aids removed NA Loose dentures or partials removed removed by patient Prosthetics have been removed NA Patient demonstrates correct use of air break device (if applicable) Patient concerns have been addressed Patient grounding bracelet on and cord attached to chamber Specifics for Inpatients (complete in addition to above) Medication sheet sent  with patient NA Intravenous medications needed or due during therapy sent with patient NA Drainage tubes (e.g. nasogastric tube or chest tube secured and vented) NA Endotracheal or Tracheotomy tube secured NA Cuff deflated of air and inflated with saline NA Airway suctioned NA Notes The safety checklist was done before the treatment was started. Electronic Signature(s) Signed: 10/08/2022 1:53:21 PM By: Valeria Batman EMT Entered By: Valeria Batman on 10/08/2022 13:53:21

## 2022-10-11 ENCOUNTER — Encounter (HOSPITAL_BASED_OUTPATIENT_CLINIC_OR_DEPARTMENT_OTHER): Payer: Medicare Other | Admitting: Internal Medicine

## 2022-10-12 ENCOUNTER — Encounter (HOSPITAL_BASED_OUTPATIENT_CLINIC_OR_DEPARTMENT_OTHER): Payer: Medicare Other | Admitting: Internal Medicine

## 2022-10-12 DIAGNOSIS — E11621 Type 2 diabetes mellitus with foot ulcer: Secondary | ICD-10-CM | POA: Diagnosis not present

## 2022-10-12 DIAGNOSIS — L97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: Secondary | ICD-10-CM

## 2022-10-12 DIAGNOSIS — L97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: Secondary | ICD-10-CM | POA: Diagnosis not present

## 2022-10-12 DIAGNOSIS — M86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, left ankle and foot: Secondary | ICD-10-CM | POA: Diagnosis not present

## 2022-10-12 LAB — GLUCOSE, CAPILLARY: Glucose-Capillary: 135 mg/dL — ABNORMAL HIGH (ref 70–99)

## 2022-10-12 NOTE — Progress Notes (Signed)
MARSHAWN, NORMOYLE R ([id number]) [id number] Page 1 of 2 Visit Report for 10/12/2022 Arrival Information Details Patient Name: Date of Service: [place], [name] 10/12/2022 8:00 A M Medical Record Number: [id number] Patient Account Number: [id number] Date of Birth/Sex: Treating RN: 13-Sep-1956 (66 y.o. [name], Meta.Reding Primary Care Olimpia Tinch: Nelda Bucks Other Clinician: Valeria Batman Referring Dajana Gehrig: Treating Lucia Mccreadie/Extender: Colin Benton in Treatment: 4 Visit Information History Since Last Visit All ordered tests and consults were completed: Yes Patient Arrived: Knee Scooter Added or deleted any medications: No Arrival Time: 07:38 Any new allergies or adverse reactions: No Accompanied By: None Had a fall or experienced change in No Transfer Assistance: None activities of daily living that may affect Patient Identification Verified: Yes risk of falls: Secondary Verification Process Completed: Yes Signs or symptoms of abuse/neglect since last visito No Patient Requires Transmission-Based Precautions: No Hospitalized since last visit: No Patient Has Alerts: No Implantable device outside of the clinic excluding No cellular tissue based products placed in the center since last visit: Pain Present Now: No Electronic Signature(s) Signed: 10/12/2022 2:32:52 PM By: Valeria Batman EMT Entered By: Valeria Batman on 10/12/2022 14:32:52 -------------------------------------------------------------------------------- Encounter Discharge Information Details Patient Name: Date of Service: [name], [name] 10/12/2022 8:00 A M Medical Record Number: [id number] Patient Account Number: [id number] Date of Birth/Sex: Treating RN: [birth year] (66 y.o. [name], Meta.Reding Primary Care Jaaziel Peatross: Nelda Bucks Other Clinician: Valeria Batman Referring Karene Bracken: Treating Sheria Rosello/Extender: Colin Benton in  Treatment: 4 Encounter Discharge Information Items Discharge Condition: Stable Ambulatory Status: Knee Scooter Discharge Destination: Home Transportation: Private Auto Accompanied By: None Schedule Follow-up Appointment: Yes Clinical Summary of Care: Electronic Signature(s) Signed: 10/12/2022 3:07:25 PM By: Valeria Batman EMT Entered By: Valeria Batman on 10/12/2022 15:07:25 [name] ([id number]) [id number] Page 2 of 2 -------------------------------------------------------------------------------- Vitals Details Patient Name: Date of Service: [place], [name] 10/12/2022 8:00 A M Medical Record Number: [id number] Patient Account Number: [id number] Date of Birth/Sex: Treating RN: [birth year] (66 y.o. [name], Meta.Reding Primary Care Amen Dargis: Nelda Bucks Other Clinician: Valeria Batman Referring Guinn Delarosa: Treating Malesha Suliman/Extender: Colin Benton in Treatment: 4 Vital Signs Time Taken: 08:24 Temperature (F): 98.8 Height (in): 74 Pulse (bpm): 94 Weight (lbs): 245 Respiratory Rate (breaths/min): 14 Body Mass Index (BMI): 31.5 Blood Pressure (mmHg): 135/79 Capillary Blood Glucose (mg/dl): 135 Reference Range: 80 - 120 mg / dl Electronic Signature(s) Signed: 10/12/2022 2:33:48 PM By: Valeria Batman EMT Entered By: Valeria Batman on 10/12/2022 14:33:48

## 2022-10-12 NOTE — Progress Notes (Addendum)
KALEV, FALKE R ([id number]) [id number] Page 1 of 2 Visit Report for 10/12/2022 HBO Details Patient Name: Date of Service: [place], [name] 10/12/2022 8:00 A [name] Medical Record Number: [id number] Patient Account Number: [id number] Date of Birth/Sex: Treating RN: 02/09/1956 (66 y.o. 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 HBO Treatment Details Treatment Number: 5 Patient Type: Outpatient Chamber Type: Monoplace Chamber Serial #: [id number] Treatment Protocol: 2.0 ATA with 90 minutes oxygen, with two 5 minute air breaks Treatment Details Compression Rate Down: 1.0 psi / minute De-Compression Rate Up: 1.5 psi / minute A breaks and breathing ir Compress Tx Pressure periods Decompress Decompress Begins Reached (leave unused spaces Begins Ends blank) Chamber Pressure (ATA 1 2 2 2 2 2  --2 1 ) Clock Time (24 hr) 08:39 09:02 09:32 09:37 10:07 10:12 - - 10:42 10:56 Treatment Length: 137 (minutes) Treatment Segments: 5 Vital Signs Capillary Blood Glucose Reference Range: 80 - 120 mg / dl HBO Diabetic Blood Glucose Intervention Range: <131 mg/dl or >249 mg/dl Time Vitals Blood Respiratory Capillary Blood Glucose Pulse Action Type: Pulse: Temperature: Taken: Pressure: Rate: Glucose (mg/dl): Meter #: Oximetry (%) Taken: Pre 08:24 135/79 94 14 98.8 135 Post 11:01 167/80 78 16 97.9 155 Treatment Response Treatment Toleration: Well Treatment Completion Status: Treatment Completed without Adverse Event Treatment Notes The patient asked for a Glucerna before treatment started due to his blood  sugar being 135. The patient was 8 oz of Glucerna. The patient use Afrin 1 spray on each side before the treatment was started. The patient was seen by Dr. Celine [name] to look at his ears before treatment today. The patient had no problems with his treatment today. After treatment I called Dr. Heber [name] to see [name] to check his ears. Dr. Heber [name] informed the patient that he needed to see the ENT and be cleared , before coming back for treatment. [name] Notes Evaluated patient post treatment. He still has yellow-brown drainage behind the tympanic membrane. He also has some mild irritation to the tympanic region. He is currently taking ciprofloxacin drops. I recommended following up with ENT and to be cleared before reinitiating hyperbaric oxygen therapy. Physician HBO Attestation: I certify that I supervised this HBO treatment in accordance with Medicare guidelines. A trained emergency response team is readily available per Yes hospital policies and procedures. Continue HBOT as ordered. Yes Electronic Signature(s) Signed: 10/15/2022 1:40:10 PM By: [name] Shan DO Previous Signature: 10/12/2022 3:05:07 PM Version By: [name] EMT [name] ([id number]) [id number] Page 2 of 2 Entered By: [name] on 10/12/2022 16:05:59 -------------------------------------------------------------------------------- HBO Safety Checklist Details Patient Name: Date of Service: [place], [name] 10/12/2022 8:00 A [name] Medical Record Number: [id number] Patient Account Number: [id number] Date of Birth/Sex: Treating RN: 30-Apr-1956 (66 y.o. [name], [name] Primary Care Kerolos Nehme: [name] Other Clinician: Valeria [name] Referring Jamine Wingate: Treating Carie Kapuscinski/Extender: [name] in Treatment: 4 HBO Safety Checklist Items Safety Checklist Consent Form Signed Patient voided / foley secured and emptied When did you last eato  0700 Last dose of injectable or oral agent 0445 Ostomy pouch emptied and vented if applicable NA All implantable devices assessed, documented and approved NA Intravenous access site secured and place NA Valuables secured  Linens and cotton and cotton/polyester blend (less than 51% polyester) Personal oil-based products / skin lotions / body lotions removed Wigs or hairpieces removed NA Smoking or tobacco materials removed Books / newspapers / magazines / loose paper removed Cologne, aftershave, perfume and deodorant removed Jewelry removed (may wrap wedding band) Make-up removed NA Hair care products removed Battery operated devices (external) removed Heating patches and chemical warmers removed Titanium eyewear removed NA Nail polish cured greater than 10 hours NA Casting material cured greater than 10 hours NA Hearing aids removed NA Loose dentures or partials removed removed by patient Prosthetics have been removed NA Patient demonstrates correct use of air break device (if applicable) Patient concerns have been addressed Patient grounding bracelet on and cord attached to chamber Specifics for Inpatients (complete in addition to above) Medication sheet sent with patient NA Intravenous medications needed or due during therapy sent with patient NA Drainage tubes (e.g. nasogastric tube or chest tube secured and vented) NA Endotracheal or Tracheotomy tube secured NA Cuff deflated of air and inflated with saline NA Airway suctioned NA Electronic Signature(s) Signed: 10/12/2022 2:35:17 PM By: [name] EMT Entered By: [name] on 10/12/2022 14:35:17

## 2022-10-13 ENCOUNTER — Encounter (HOSPITAL_BASED_OUTPATIENT_CLINIC_OR_DEPARTMENT_OTHER): Payer: Medicare Other | Admitting: General Surgery

## 2022-10-13 LAB — GLUCOSE, CAPILLARY: Glucose-Capillary: 155 mg/dL — ABNORMAL HIGH (ref 70–99)

## 2022-10-14 ENCOUNTER — Encounter (HOSPITAL_BASED_OUTPATIENT_CLINIC_OR_DEPARTMENT_OTHER): Payer: Medicare Other | Admitting: General Surgery

## 2022-10-15 ENCOUNTER — Encounter (HOSPITAL_BASED_OUTPATIENT_CLINIC_OR_DEPARTMENT_OTHER): Payer: Medicare Other | Admitting: Internal Medicine

## 2022-10-15 NOTE — Progress Notes (Signed)
[name], [name] ([id number]) [name].pdf Page 1 of 2 Visit Report for 10/12/2022 Problem List Details Patient Name: Date of Service: [place], [name] 10/12/2022 8:00 A M Medical Record Number: [id number] Patient Account Number: [id number] Date of Birth/Sex: Treating RN: [name], [name] (66 y.o. 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 10/12/2022 Medical Record Number: [id number] Patient Account Number: [id number] Date of Birth/Sex: Treating RN: [name], [name] (66 y.o. 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 Facility Procedures : CPT4 Code Description: [id number]-(Facility Use Only) HBOT full body chamber, 101min , ICD-10 Diagnosis Description [phone] Other chronic osteomyelitis, left ankle and foot L97.424 Non-pressure chronic ulcer of left heel and midfoot with n L97.523  Non-pressure chronic ulcer of other part of left foot with E11.621 Type 2 diabetes mellitus with foot ulcer Modifier: ecrosis of bo necrosis of Quantity: 5 ne muscle Physician Procedures : CPT4 Code Description Modifier [id number] - WC PHYS HYPERBARIC OXYGEN THERAPY ICD-10 Diagnosis Description 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, left ankle and foot L97.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

## 2022-10-18 ENCOUNTER — Encounter (HOSPITAL_BASED_OUTPATIENT_CLINIC_OR_DEPARTMENT_OTHER): Payer: Medicare Other | Attending: General Surgery | Admitting: General Surgery

## 2022-10-18 DIAGNOSIS — L97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: Secondary | ICD-10-CM | POA: Diagnosis not present

## 2022-10-18 DIAGNOSIS — Z794 Long term (current) use of insulin: Secondary | ICD-10-CM | POA: Diagnosis not present

## 2022-10-18 DIAGNOSIS — E1165 Type 2 diabetes mellitus with hyperglycemia: Secondary | ICD-10-CM | POA: Diagnosis not present

## 2022-10-18 DIAGNOSIS — E1140 Type 2 diabetes mellitus with diabetic neuropathy, unspecified: Secondary | ICD-10-CM | POA: Diagnosis not present

## 2022-10-18 DIAGNOSIS — E11621 Type 2 diabetes mellitus with foot ulcer: Secondary | ICD-10-CM | POA: Diagnosis not present

## 2022-10-18 DIAGNOSIS — M86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, left ankle and foot: Secondary | ICD-10-CM | POA: Diagnosis not present

## 2022-10-18 DIAGNOSIS — E1169 Type 2 diabetes mellitus with other specified complication: Secondary | ICD-10-CM | POA: Insufficient documentation

## 2022-10-18 DIAGNOSIS — L97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: Secondary | ICD-10-CM | POA: Insufficient documentation

## 2022-10-18 NOTE — Progress Notes (Signed)
[name], [name] ([id number]) [id number] Page 1 of 10 Visit Report for 10/18/2022 Arrival Information Details Patient Name: Date of Service: [place], [name] 10/18/2022 8:00 A M Medical Record Number: [id number] Patient Account Number: [id number] Date of Birth/Sex: Treating RN: 1956/09/04 (66 y.o. [name], [place] Primary Care Francies Inch: Nelda Bucks Other Clinician: Referring Brain Honeycutt: Treating Georgenia Salim/Extender: Doristine Bosworth in Treatment: 5 Visit Information History Since Last Visit Any new allergies or adverse reactions: No Patient Arrived: Ambulatory Had a fall or experienced change in No Arrival Time: 08:03 activities of daily living that may affect Accompanied By: self risk of falls: Transfer Assistance: None Signs or symptoms of abuse/neglect since last No Patient Requires Transmission-Based Precautions: No visito Patient Has Alerts: No Hospitalized since last visit: No Implantable device outside of the clinic No excluding cellular tissue based products placed in the center since last visit: Has Dressing in Place as Prescribed: Yes Has Footwear/Offloading in Place as Yes Prescribed: Left: Removable Cast Walker/Walking Boot Pain Present Now: No Electronic Signature(s) Signed: 10/18/2022 4:40:13 PM By: Blanche East RN Entered By: Blanche East on 10/18/2022 08:04:21 -------------------------------------------------------------------------------- Encounter Discharge Information Details Patient Name: Date of Service: [name], [name] 10/18/2022 8:00 A M Medical Record Number: [id number] Patient Account Number: [id number] Date of Birth/Sex: Treating RN: [birth year] (66 y.o. [name] Session Primary Care Nhan Qualley: Nelda Bucks Other Clinician: Referring Manolito Jurewicz: Treating Sonali Wivell/Extender: Doristine Bosworth in Treatment: 5 Encounter Discharge Information Items Post Procedure  Vitals Discharge Condition: Stable Temperature (F): 98.3 Ambulatory Status: Ambulatory Pulse (bpm): 93 Discharge Destination: Home Respiratory Rate (breaths/min): 16 Transportation: Private Auto Blood Pressure (mmHg): 158/93 Accompanied By: self Schedule Follow-up Appointment: No Clinical Summary of Care: Electronic Signature(s) Signed: 10/18/2022 4:40:13 PM By: Blanche East RN Entered By: Blanche East on 10/18/2022 08:45:51 [name] ([id number]) [id number] Page 2 of 10 -------------------------------------------------------------------------------- Lower Extremity Assessment Details Patient Name: Date of Service: [name], [name] 10/18/2022 8:00 A M Medical Record Number: [id number] Patient Account Number: [id number] Date of Birth/Sex: Treating RN: Jun 21, 1956 (66 y.o. [name] Session Primary Care Bristyl Mclees: Nelda Bucks Other Clinician: Referring Radley Barto: Treating Enes Rokosz/Extender: Doristine Bosworth in Treatment: 5 Edema Assessment Assessed: [name]: No] [Right: No] [Left: Edema] [Right: :] Calf Left: Right: Point of Measurement: From Medial Instep 46 cm Ankle Left: Right: Point of Measurement: From Medial Instep 27.3 cm Vascular Assessment Pulses: Dorsalis Pedis Palpable: [Left:Yes] Electronic Signature(s) Signed: 10/18/2022 4:40:13 PM By: Blanche East RN Entered By: Blanche East on 10/18/2022 08:08:11 -------------------------------------------------------------------------------- Multi Wound Chart Details Patient Name: Date of Service: [name], [name] 10/18/2022 8:00 A M Medical Record Number: [id number] Patient Account Number: [id number] Date of Birth/Sex: Treating RN: [birth year] (66 y.o. M) Primary Care Orlando Devereux: Nelda Bucks Other Clinician: Referring Anajah Sterbenz: Treating Brysin Towery/Extender: Doristine Bosworth in Treatment: 5 Vital Signs Height(in): 74 Pulse(bpm):  93 Weight(lbs): 245 Blood Pressure(mmHg): 158/93 Body Mass Index(BMI): 31.5 Temperature(F): 98.3 Respiratory Rate(breaths/min): 16 [1:Photos:] [N/A:N/A [id number] Page 3 of 10] Left Calcaneus Left, Dorsal Foot N/A Wound Location: Pressure Injury Pressure Injury N/A Wounding Event: Diabetic Wound/Ulcer of the Lower Diabetic Wound/Ulcer of the Lower N/A Primary Etiology: Extremity Extremity Cataracts, Sleep Apnea, Hypertension, Cataracts, Sleep Apnea, Hypertension, N/A Comorbid History: Type II Diabetes, Osteomyelitis, Type II Diabetes, Osteomyelitis, Neuropathy Neuropathy 03/09/2019 07/09/2022 N/A Date Acquired: 5 5 N/A Weeks of Treatment: Open Open N/A Wound Status: No No N/A Wound Recurrence: 6.5x5x0.5 3.5x2.6x0.1 N/A Measurements L x W x D (cm)  25.525 7.147 N/A A (cm) : rea 12.763 0.715 N/A Volume (cm) : -18.20% 24.20% N/A % Reduction in A rea: 40.90% 24.10% N/A % Reduction in Volume: 9 Starting Position 1 (o'clock): 11 Ending Position 1 (o'clock): 1.6 Maximum Distance 1 (cm): Yes No N/A Undermining: Grade 3 Grade 2 N/A Classification: Medium Medium N/A Exudate A mount: Serosanguineous Serosanguineous N/A Exudate Type: red, brown red, brown N/A Exudate Color: Fibrotic scar, thickened scar N/A N/A Wound Margin: Medium (34-66%) Medium (34-66%) N/A Granulation A mount: Red, Pink Pink N/A Granulation Quality: Medium (34-66%) Medium (34-66%) N/A Necrotic A mount: Eschar, Adherent Slough Eschar, Adherent Slough N/A Necrotic Tissue: Fat Layer (Subcutaneous Tissue): Yes Fat Layer (Subcutaneous Tissue): Yes N/A Exposed Structures: Fascia: No Fascia: No Tendon: No Tendon: No Muscle: No Muscle: No Joint: No Joint: No Bone: No Bone: No Small (1-33%) Medium (34-66%) N/A Epithelialization: Debridement ([id number]) - ExcisionalDebridement ([id number]) - ExcisionalN/A Debridement: Pre-procedure Verification/Time Out 08:24  08:24 N/A Taken: Callus, Subcutaneous, Slough Subcutaneous, Slough N/A Tissue Debrided: Skin/Subcutaneous Tissue Skin/Subcutaneous Tissue N/A Level: 32.5 9.1 N/A Debridement A (sq cm): rea Curette Curette N/A Instrument: Minimum Minimum N/A Bleeding: 0 0 N/A Procedural Pain: 0 0 N/A Post Procedural Pain: Procedure was tolerated well Procedure was tolerated well N/A Debridement Treatment Response: 6.5x5x0.5 3.5x2.6x0.1 N/A Post Debridement Measurements L x W x D (cm) 12.763 0.715 N/A Post Debridement Volume: (cm) Callus: Yes No Abnormalities Noted N/A Periwound Skin Texture: Maceration: Yes Maceration: Yes N/A Periwound Skin Moisture: Dry/Scaly: No Erythema: Yes N/A Periwound Skin Color: N/A No Abnormality N/A Temperature: Debridement Debridement N/A Procedures Performed: Treatment Notes Wound #1 (Calcaneus) Wound Laterality: Left Cleanser Soap and Water Discharge Instruction: May shower and wash wound with dial antibacterial soap and water prior to dressing change. Peri-Wound Care Sween Lotion (Moisturizing lotion) Discharge Instruction: Apply moisturizing lotion as directed Topical Gentamicin Discharge Instruction: As directed by physician Primary Dressing KerraCel Ag Gelling Fiber Dressing, 4x5 in (silver alginate) Discharge Instruction: Apply silver alginate to wound bed as instructed Secondary Dressing ABD Pad, 5x9 Discharge Instruction: Apply over primary dressing as directed. Zetuvit Plus Silicone [address] (in/in) LAKEITH, CAREAGA R ([id number]) [id number] Page 4 of 10 Discharge Instruction: Apply silicone border over primary dressing as directed. Secured With [employer] Surgical T 2x10 (in/yd) ape Discharge Instruction: Secure with tape as directed. Compression Wrap Kerlix Roll 4.5x3.1 (in/yd) Discharge Instruction: Apply Kerlix and Coban compression as directed. Coban Self-Adherent Wrap 4x5  (in/yd) Discharge Instruction: Apply over Kerlix as directed. Compression Stockings Add-Ons Wound #2 (Foot) Wound Laterality: Dorsal, Left Cleanser Soap and Water Discharge Instruction: May shower and wash wound with dial antibacterial soap and water prior to dressing change. Peri-Wound Care Sween Lotion (Moisturizing lotion) Discharge Instruction: Apply moisturizing lotion as directed Topical Gentamicin Discharge Instruction: As directed by physician Primary Dressing KerraCel Ag Gelling Fiber Dressing, 4x5 in (silver alginate) Discharge Instruction: Apply silver alginate to wound bed as instructed Secondary Dressing ALLEVYN Heel 4 1/2in x 5 1/2in / 10.5cm x 13.5cm Discharge Instruction: Apply over primary dressing as directed. Zetuvit Plus Silicone Border Dressing 5x5 (in/in) Discharge Instruction: Apply silicone border over primary dressing as directed. Secured With [employer] Surgical T 2x10 (in/yd) ape Discharge Instruction: Secure with tape as directed. Compression Wrap Kerlix Roll 4.5x3.1 (in/yd) Discharge Instruction: Apply Kerlix and Coban compression as directed. Coban Self-Adherent Wrap 4x5 (in/yd) Discharge Instruction: Apply over Kerlix as directed. Compression Stockings Add-Ons Electronic Signature(s) Signed: 10/18/2022 9:18:06 AM By: Fredirick Maudlin MD FACS Entered By:  Fredirick Maudlin on 10/18/2022 09:18:06 -------------------------------------------------------------------------------- Multi-Disciplinary Care Plan Details Patient Name: Date of Service: [place], [name] 10/18/2022 8:00 A M Medical Record Number: [id number] Patient Account Number: [id number] Date of Birth/Sex: Treating RN: [birth year] (67 y.o. 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 and treatment procedures Potential for barotraumas to ears, sinuses, teeth, and lungs or cerebral gas embolism related to changes in atmospheric pressure inside hyperbaric oxygen chamber Potential for oxygen toxicity seizures related to delivery of 100% oxygen at an increased atmospheric pressure Potential for pulmonary oxygen toxicity related to delivery of 100% oxygen at an increased atmospheric pressure Goals: Barotrauma will be prevented during HBO2 Date Initiated: 10/06/2022 Target Resolution Date: 11/03/2022 Goal Status: Active Patient and/or family will be able to state/discuss factors appropriate to the management of their disease process during treatment Date Initiated: 10/06/2022 Target Resolution Date: 11/03/2022 Goal Status: Active Patient will tolerate the hyperbaric oxygen therapy treatment Date Initiated: 10/06/2022 Target Resolution Date: 11/03/2022 Goal Status: Active Patient/caregiver will verbalize understanding of HBO goals, rationale, procedures and potential hazards Date Initiated: 10/06/2022 Target Resolution Date: 11/03/2022 Goal Status: Active Interventions: Administer decongestants, per physician orders, prior to HBO2 Administer the correct therapeutic gas delivery based on the patients needs and limitations, per physician order Assess and provide for patients comfort related to the hyperbaric environment and equalization of middle ear Assess for signs and symptoms related to adverse events, including but not limited to confinement anxiety, pneumothorax, oxygen toxicity and baurotrauma Notes: Wound/Skin Impairment Nursing Diagnoses: Impaired tissue integrity Goals: Patient/caregiver will verbalize understanding of skin care regimen Date Initiated: 10/06/2022 Target  Resolution Date: 11/03/2022 Goal Status: Active Ulcer/skin breakdown will have a volume reduction of 30% by week 4 Date Initiated: 09/10/2022 Date Inactivated: 10/06/2022 Target Resolution Date: 10/08/2022 Goal Status: Unmet Unmet Reason: osteo, HBOT Ulcer/skin breakdown will have a volume reduction of 50% by week 8 Date Initiated: 10/06/2022 Target Resolution Date: 11/03/2022 Goal Status: Active Interventions: Assess ulceration(s) every visit Provide education on ulcer and skin care Treatment Activities: Consult for HBO : 09/10/2022 Skin care regimen initiated : 09/10/2022 Notes: Electronic Signature(s) Signed: 10/18/2022 4:40:13 PM By: Blanche East RN Entered By: Blanche East on 10/18/2022 08:15:50 [name] ([id number]) [id number] Page 6 of 10 -------------------------------------------------------------------------------- Pain Assessment Details Patient Name: Date of Service: [place], [name] 10/18/2022 8:00 A M Medical Record Number: [id number] Patient Account Number: [id number] Date of Birth/Sex: Treating RN: 1956-07-31 (66 y.o. [name] Session Primary Care Zaccary Creech: Nelda Bucks Other Clinician: Referring Dakisha Schoof: Treating Charnel Giles/Extender: Doristine Bosworth in Treatment: 5 Active Problems Location of Pain Severity and Description of Pain Patient Has Paino No Site Locations Pain Management and Medication Current Pain Management: Electronic Signature(s) Signed: 10/18/2022 4:40:13 PM By: Blanche East RN Entered By: Blanche East on 10/18/2022 08:05:14 -------------------------------------------------------------------------------- Patient/Caregiver Education Details Patient Name: Date of Service: [place] NA LD R. 11/6/2023andnbsp8:00 A M Medical Record Number: [id number] Patient Account Number: [id number] Date of Birth/Gender: Treating RN: [birth year] (66 y.o. [name] Session Primary Care Physician:  Nelda Bucks Other Clinician: Referring Physician: Treating Physician/Extender: Doristine Bosworth in Treatment: 5 Education Assessment Education Provided To: Patient Education Topics Provided Wound/Skin Impairment: Methods: Explain/Verbal Responses: Reinforcements needed, State content correctly  DARLYN, BOLEJACK ([id number]) [id number] Page 7 of 10 Electronic Signature(s) Signed: 10/18/2022 4:40:13 PM By: Blanche East RN Entered By: Blanche East on 10/18/2022 08:16:02 -------------------------------------------------------------------------------- Wound Assessment Details Patient Name: Date of Service: [place], [name] 10/18/2022 8:00 A M Medical Record Number: [id number] Patient Account Number: [id number] Date of Birth/Sex: Treating RN: 1956/12/12 (66 y.o. [name] Session Primary Care Byron Peacock: Nelda Bucks Other Clinician: Referring Kord Monette: Treating Ravleen Ries/Extender: Doristine Bosworth in Treatment: 5 Wound Status Wound Number: 1 Primary Diabetic Wound/Ulcer of the Lower Extremity Etiology: Wound Location: Left Calcaneus Wound Open Wounding Event: Pressure Injury Status: Date Acquired: 03/09/2019 Comorbid Cataracts, Sleep Apnea, Hypertension, Type II Diabetes, Weeks Of Treatment: 5 History: Osteomyelitis, Neuropathy Clustered Wound: No Photos Wound Measurements Length: (cm) 6.5 Width: (cm) 5 Depth: (cm) 0.5 Area: (cm) 25.525 Volume: (cm) 12.763 % Reduction in Area: -18.2% % Reduction in Volume: 40.9% Epithelialization: Small (1-33%) Tunneling: No Undermining: Yes Starting Position (o'clock): 9 Ending Position (o'clock): 11 Maximum Distance: (cm) 1.6 Wound Description Classification: Grade 3 Wound Margin: Fibrotic scar, thickened scar Exudate Amount: Medium Exudate Type: Serosanguineous Exudate Color: red, brown Foul Odor After Cleansing: No Slough/Fibrino Yes Wound  Bed Granulation Amount: Medium (34-66%) Exposed Structure Granulation Quality: Red, Pink Fascia Exposed: No Necrotic Amount: Medium (34-66%) Fat Layer (Subcutaneous Tissue) Exposed: Yes Necrotic Quality: Eschar, Adherent Slough Tendon Exposed: No Muscle Exposed: No Joint Exposed: No Bone Exposed: No [address] ACELIN, TUFFORD R ([id number]) [id number] Page 8 of 10 No Abnormalities Noted: No No Abnormalities Noted: No Callus: Yes Moisture No Abnormalities Noted: No Dry / Scaly: No Maceration: Yes Treatment Notes Wound #1 (Calcaneus) Wound Laterality: Left Cleanser Soap and Water Discharge Instruction: May shower and wash wound with dial antibacterial soap and water prior to dressing change. Peri-Wound Care Sween Lotion (Moisturizing lotion) Discharge Instruction: Apply moisturizing lotion as directed Topical Gentamicin Discharge Instruction: As directed by physician Primary Dressing KerraCel Ag Gelling Fiber Dressing, 4x5 in (silver alginate) Discharge Instruction: Apply silver alginate to wound bed as instructed Secondary Dressing ABD Pad, 5x9 Discharge Instruction: Apply over primary dressing as directed. Zetuvit Plus Silicone Border Dressing 5x5 (in/in) Discharge Instruction: Apply silicone border over primary dressing as directed. Secured With [employer] Surgical T 2x10 (in/yd) ape Discharge Instruction: Secure with tape as directed. Compression Wrap Kerlix Roll 4.5x3.1 (in/yd) Discharge Instruction: Apply Kerlix and Coban compression as directed. Coban Self-Adherent Wrap 4x5 (in/yd) Discharge Instruction: Apply over Kerlix as directed. Compression Stockings Add-Ons Electronic Signature(s) Signed: 10/18/2022 4:40:13 PM By: Blanche East RN Entered By: Blanche East on 10/18/2022 08:17:31 -------------------------------------------------------------------------------- Wound Assessment  Details Patient Name: Date of Service: [place], [name] 10/18/2022 8:00 A M Medical Record Number: [id number] Patient Account Number: [id number] Date of Birth/Sex: Treating RN: 31-May-1956 (66 y.o. [name] Session Primary Care Rael Yo: Nelda Bucks Other Clinician: Referring Janard Culp: Treating Ravin Denardo/Extender: Doristine Bosworth in Treatment: 5 Wound Status Wound Number: 2 Primary Diabetic Wound/Ulcer of the Lower Extremity Etiology: Wound Location: Left, Dorsal Foot Wound Open Wounding Event: Pressure Injury Status: Date Acquired: 07/09/2022 Comorbid Cataracts, Sleep Apnea, Hypertension, Type II Diabetes, Weeks Of Treatment: Livengood, Irwin ([id number]) [id number] Page 9 of 10 Weeks Of Treatment: 5 History: Osteomyelitis, Neuropathy Clustered Wound: No Photos Wound Measurements Length: (cm) 3.5 Width: (cm) 2.6 Depth: (cm) 0.1 Area: (cm) 7.147 Volume: (cm) 0.715 % Reduction in Area: 24.2% % Reduction in Volume: 24.1% Epithelialization: Medium (34-66%) Tunneling: No Undermining: No Wound Description Classification:  Grade 2 Exudate Amount: Medium Exudate Type: Serosanguineous Exudate Color: red, brown Foul Odor After Cleansing: No Slough/Fibrino Yes Wound Bed Granulation Amount: Medium (34-66%) Exposed Structure Granulation Quality: Pink Fascia Exposed: No Necrotic Amount: Medium (34-66%) Fat Layer (Subcutaneous Tissue) Exposed: Yes Necrotic Quality: Eschar, Adherent Slough Tendon Exposed: No Muscle Exposed: No Joint Exposed: No Bone Exposed: No Periwound Skin Texture Texture Color No Abnormalities Noted: Yes No Abnormalities Noted: Yes Moisture Temperature / Pain No Abnormalities Noted: No Temperature: No Abnormality Maceration: Yes Treatment Notes Wound #2 (Foot) Wound Laterality: Dorsal, Left Cleanser Soap and Water Discharge Instruction: May shower and wash wound with dial antibacterial soap  and water prior to dressing change. Peri-Wound Care Sween Lotion (Moisturizing lotion) Discharge Instruction: Apply moisturizing lotion as directed Topical Gentamicin Discharge Instruction: As directed by physician Primary Dressing KerraCel Ag Gelling Fiber Dressing, 4x5 in (silver alginate) Discharge Instruction: Apply silver alginate to wound bed as instructed Secondary Dressing ALLEVYN Heel 4 1/2in x 5 1/2in / 10.5cm x 13.5cm Discharge Instruction: Apply over primary dressing as directed. Zetuvit Plus Silicone Border Dressing 5x5 (in/in) Discharge Instruction: Apply silicone border over primary dressing as directed. Secured With MARLAN, LENNEY ([id number]) [id number] Page 10 of 10 8M [employer]ipore Soft Cloth Surgical T 2x10 (in/yd) ape Discharge Instruction: Secure with tape as directed. Compression Wrap Kerlix Roll 4.5x3.1 (in/yd) Discharge Instruction: Apply Kerlix and Coban compression as directed. Coban Self-Adherent Wrap 4x5 (in/yd) Discharge Instruction: Apply over Kerlix as directed. Compression Stockings Add-Ons Electronic Signature(s) Signed: 10/18/2022 4:40:13 PM By: Blanche East RN Entered By: Blanche East on 10/18/2022 08:18:48 -------------------------------------------------------------------------------- Vitals Details Patient Name: Date of Service: [name], [name] 10/18/2022 8:00 A M Medical Record Number: [id number] Patient Account Number: [id number] Date of Birth/Sex: Treating RN: [birth year] (66 y.o. [name] Session Primary Care Maddelynn Moosman: Nelda Bucks Other Clinician: Referring Delissa Silba: Treating Sejal Cofield/Extender: Doristine Bosworth in Treatment: 5 Vital Signs Time Taken: 08:04 Temperature (F): 98.3 Height (in): 74 Pulse (bpm): 93 Weight (lbs): 245 Respiratory Rate (breaths/min): 16 Body Mass Index (BMI): 31.5 Blood Pressure (mmHg): 158/93 Reference Range: 80 - 120 mg / dl Notes Blood sugar  not checked by pt this morning. Electronic Signature(s) Signed: 10/18/2022 4:40:13 PM By: Blanche East RN Entered By: Blanche East on 10/18/2022 08:05:09

## 2022-10-18 NOTE — Progress Notes (Addendum)
[name], [name] ([id number]) [id number] Page 1 of 11 Visit Report for 10/18/2022 Chief Complaint Document Details Patient Name: Date of Service: [name], [name]. 10/18/2022 8:00 A M Medical Record Number: [id number] Patient Account Number: [id number] Date of Birth/Sex: Treating RN: Sep 29, 1956 (67 y.o. M) Primary Care Provider: Nelda Bucks Other Clinician: Referring Provider: Treating Provider/Extender: Doristine Bosworth in Treatment: 5 Information Obtained from: Patient Chief Complaint Patients presents for treatment of an open diabetic ulcer and evaluation for hyperbaric oxygen therapy Electronic Signature(s) Signed: 10/18/2022 9:18:12 AM By: Fredirick Maudlin MD FACS Entered By: Fredirick Maudlin on 10/18/2022 09:18:11 -------------------------------------------------------------------------------- Debridement Details Patient Name: Date of Service: [name], [name]. 10/18/2022 8:00 A M Medical Record Number: [id number] Patient Account Number: [id number] Date of Birth/Sex: Treating RN: 1956-09-03 (66 y.o. [name] Primary Care Provider: Nelda Bucks Other Clinician: Referring Provider: Treating Provider/Extender: Doristine Bosworth in Treatment: 5 Debridement Performed for Assessment: Wound #1 Left Calcaneus Performed By: Physician Fredirick Maudlin, MD Debridement Type: Debridement Severity of Tissue Pre Debridement: Fat layer exposed Level of Consciousness (Pre-procedure): Awake and Alert Pre-procedure Verification/Time Out Yes - 08:24 Taken: Start Time: 08:25 T Area Debrided (L x W): otal 6.5 (cm) x 5 (cm) = 32.5 (cm) Tissue and other material debrided: Viable, Non-Viable, Callus, Slough, Subcutaneous, Skin: Epidermis, Slough Level: Skin/Subcutaneous Tissue Debridement Description: Excisional Instrument: Curette Bleeding: Minimum Hemostasis Achieved: Pressure Response to Treatment:  Procedure was tolerated well Level of Consciousness (Post- Awake and Alert procedure): Post Debridement Measurements of Total Wound Length: (cm) 6.5 Width: (cm) 5 Depth: (cm) 0.5 Volume: (cm) 12.763 Character of Wound/Ulcer Post Debridement: Requires Further Debridement Severity of Tissue Post Debridement: Fat layer exposed Post Procedure Diagnosis Same as Pre-procedure Notes Scribed for Dr. Celine Ahr by Blanche East, RN Electronic Signature(s) Signed: 10/19/2022 7:33:06 AM By: Fredirick Maudlin MD FACS Signed: 02/07/2023 4:57:13 PM By: Blanche East RN Previous Signature: 10/18/2022 12:07:14 PM Version By: Fredirick Maudlin MD FACS Previous Signature: 10/18/2022 4:40:13 PM Version By: Blanche East RN [name], [name] ([id number]) [id number] Page 2 of 11 Entered By: Blanche East on 10/18/2022 16:43:19 -------------------------------------------------------------------------------- Debridement Details Patient Name: Date of Service: [name], [name]. 10/18/2022 8:00 A M Medical Record Number: [id number] Patient Account Number: [id number] Date of Birth/Sex: Treating RN: [birth year] (66 y.o. [name] Primary Care Provider: Nelda Bucks Other Clinician: Referring Provider: Treating Provider/Extender: Doristine Bosworth in Treatment: 5 Debridement Performed for Assessment: Wound #2 Left,Dorsal Foot Performed By: Physician Fredirick Maudlin, MD Debridement Type: Debridement Severity of Tissue Pre Debridement: Fat layer exposed Level of Consciousness (Pre-procedure): Awake and Alert Pre-procedure Verification/Time Out Yes - 08:24 Taken: Start Time: 08:25 T Area Debrided (L x W): otal 3.5 (cm) x 2.6 (cm) = 9.1 (cm) Tissue and other material debrided: Viable, Non-Viable, Slough, Subcutaneous, Slough Level: Skin/Subcutaneous Tissue Debridement Description: Excisional Instrument: Curette Bleeding: Minimum Hemostasis Achieved:  Pressure Response to Treatment: Procedure was tolerated well Level of Consciousness (Post- Awake and Alert procedure): Post Debridement Measurements of Total Wound Length: (cm) 3.5 Width: (cm) 2.6 Depth: (cm) 0.1 Volume: (cm) 0.715 Character of Wound/Ulcer Post Debridement: Requires Further Debridement Severity of Tissue Post Debridement: Fat layer exposed Post Procedure Diagnosis Same as Pre-procedure Notes Scribed for Dr. Celine Ahr by Blanche East, RN Electronic Signature(s) Signed: 10/19/2022 7:33:06 AM By: Fredirick Maudlin MD FACS Signed: 02/07/2023 4:57:13 PM By: Blanche East RN Previous Signature: 10/18/2022 12:07:14 PM Version By: Fredirick Maudlin MD FACS Previous Signature: 10/18/2022 4:40:13 PM  Version By: Blanche East RN Entered By: Blanche East on 10/18/2022 16:43:54 -------------------------------------------------------------------------------- HPI Details Patient Name: Date of Service: [name], [name] 10/18/2022 8:00 A M Medical Record Number: [id number] Patient Account Number: [id number] Date of Birth/Sex: Treating RN: February 03, 1956 (66 y.o. M) Primary Care Provider: Nelda Bucks Other Clinician: Referring Provider: Treating Provider/Extender: Doristine Bosworth in Treatment: 5 History of Present Illness HPI Description: ADMISSION 09/10/2022 This is a 66 year old poorly controlled type II diabetic (last hemoglobin A1c 10.8%) who has had an ulcer on his heel for over 3 years. He has been seen in multiple wound care centers, including [facility] and [facility]. He reports that at least 3 doctors have recommended that he undergo below-knee amputation. He most recently met with Dr. Catalina Gravel, a vascular surgeon affiliated with [facility]. 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 Unfortunately, the patient has some extenuating social circumstances including the  fact that he cares for his wife who has stage IV colon cancer and still works, driving vehicles for [employer]. 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 He was recently hospitalized at [facility] for sepsis and currently has a PICC line through which he receives IV antibiotics. He reports having had another MRI during that hospital stay along with a chest x-ray and EKG. He apparently contacted one of the hyperbaric [name], [name] ([id number]) [id number] Page 3 of 11 therapy techs here and asked a number of questions about hyperbaric oxygen treatments. He subsequently self-referred to our center to undergo further evaluation and management. I mention to him that [facility] actually has hyperbaric chambers, but he states that he lives in [place] and this would be more convenient for him given the intensive nature of the therapy and time requirement. ABI in clinic today was 0.94. The patient actually has 2 wounds. There is a wound on the dorsum of his left foot with heavy black eschar and slough present. After debridement, this was demonstrated to involve the muscle and the extensor tendons are exposed. On his heel, there is essentially a "shark bite" type wound, with much of the heel fat pad absent. The muscle layer is exposed. There is blue-green staining around the perimeter of the wound, but no significant odor. He says he has been applying collagen to the wound on his heel and Silvadene and Betadine to the wound on his dorsal foot. 09/20/2022: The heel wound is quite macerated with wet periwound callus. There is slough accumulation on the surface. The dorsal foot wound looks better this week. There is still exposed tendon, but it is fairly clean with just a little biofilm buildup. We are still working on gathering the required documentation to submit for pretreatment review for hyperbaric oxygen therapy. 09/29/2022: The dorsal foot  wound continues to improve. I do not see any exposed tendon at this point. There is just some slough accumulation on the wound surface. He continues to have very wet macerated periwound callus on his heel. There is slough on the surface, but it is loose and thin. There is an area of undermining at the 11 o'clock position, but the overlying skin and subcutaneous tissue is healthy and viable. He has been approved for hyperbaric oxygen therapy and will start treatment tomorrow. 10/06/2022: Continued contraction and improvement of the dorsal foot wound. There is just a little bit of slough accumulation on the wound surface. The periwound  callus continues to accumulate on the heel wound and it is quite macerated. It is also persistently found with blue-green staining present. He did initiate his hyperbaric oxygen therapy, but has had significant difficulty with decompression. He will be going to ENT to have PE tubes placed. 10/18/2022: His hyperbaric oxygen therapy is on hold while his otological issues are being addressed. He came in again today with the periwound skin on both the dorsal aspect of his foot and his calcaneus completely macerated. The blue-green discoloration, however, has abated and the undermining on the calcaneus has improved. The dorsal foot wound has also contracted somewhat. Electronic Signature(s) Signed: 10/18/2022 9:19:36 AM By: Fredirick Maudlin MD FACS Entered By: Fredirick Maudlin on 10/18/2022 09:19:36 -------------------------------------------------------------------------------- Physical Exam Details Patient Name: Date of Service: [name], [name]. 10/18/2022 8:00 A M Medical Record Number: [id number] Patient Account Number: [id number] Date of Birth/Sex: Treating RN: 02/01/1956 (66 y.o. M) Primary Care Provider: Nelda Bucks Other Clinician: Referring Provider: Treating Provider/Extender: Doristine Bosworth in Treatment:  5 Constitutional Hypertensive, asymptomatic. . . . No acute distress.Marland Kitchen Respiratory Normal work of breathing on room air.. Notes 10/18/2022: He came in again today with the periwound skin on both the dorsal aspect of his foot and his calcaneus completely macerated. The blue-green discoloration, however, has abated and the undermining on the calcaneus has improved. The dorsal foot wound has also contracted somewhat. Electronic Signature(s) Signed: 10/18/2022 9:20:30 AM By: Fredirick Maudlin MD FACS Entered By: Fredirick Maudlin on 10/18/2022 09:20:30 -------------------------------------------------------------------------------- Physician Orders Details Patient Name: Date of Service: [name], [name]. 10/18/2022 8:00 A M Medical Record Number: [id number] Patient Account Number: [id number] Date of Birth/Sex: Treating RN: [birth year] (66 y.o. 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 - Dr. Celine Ahr Rm 3 Return A Tuesday 10/26/22 at 930 Bathing/ Shower/ Hygiene May shower with protection but do not get wound dressing(s) wet. Edema Control - Lymphedema / SCD / Other Left Lower Extremity Elevate legs to the level of the heart or above for 30 minutes daily and/or when sitting, a frequency of: Avoid standing for long periods of  time. Hyperbaric Oxygen Therapy Wound #1 Left Calcaneus Evaluate for HBO Therapy Indication: - chronic refractory osteomyelitis If appropriate for treatment, begin HBOT per protocol: 2.5 ATA for 90 Minutes with 2 Five (5) Minute A Breaks ir Total Number of Treatments: - 40 One treatments per day (delivered Monday through Friday unless otherwise specified in Special Instructions below): Finger stick Blood Glucose Pre- and Post- HBOT Treatment. Follow Hyperbaric Oxygen Glycemia Protocol A frin (Oxymetazoline HCL) 0.05% nasal spray - 1 spray in both nostrils daily as needed prior to HBO treatment for difficulty clearing ears Wound Treatment Wound #1 - Calcaneus Wound Laterality: Left Cleanser: Soap and Water 1 x Per Day/30 Days Discharge Instructions: May shower and wash wound with dial antibacterial soap and water prior to dressing change. Peri-Wound Care: Sween Lotion (Moisturizing lotion) 1 x Per Day/30 Days Discharge Instructions:  Apply moisturizing lotion as directed Topical: Gentamicin 1 x Per Day/30 Days Discharge Instructions: As directed by physician Prim Dressing: KerraCel Ag Gelling Fiber Dressing, 4x5 in (silver alginate) (Generic) 1 x Per Day/30 Days ary Discharge Instructions: Apply silver alginate to wound bed as instructed Secondary Dressing: ABD Pad, 5x9 (Generic) 1 x Per Day/30 Days Discharge Instructions: Apply over primary dressing as directed. Secondary Dressing: Zetuvit Plus Silicone Border Dressing 5x5 (in/in) 1 x Per Day/30 Days Discharge Instructions: Apply silicone border over primary dressing as directed. Secured With: 88M Medipore Public affairs consultant Surgical T 2x10 (in/yd) 1 x Per Day/30 Days ape Discharge Instructions: Secure with tape as directed. Compression Wrap: Kerlix Roll 4.5x3.1 (in/yd) (Generic) 1 x Per Day/30 Days Discharge Instructions: Apply Kerlix and Coban compression as directed. Compression Wrap: Coban Self-Adherent Wrap 4x5 (in/yd) (Generic) 1 x Per  Day/30 Days Discharge Instructions: Apply over Kerlix as directed. Wound #2 - Foot Wound Laterality: Dorsal, Left Cleanser: Soap and Water 1 x Per Day/30 Days Discharge Instructions: May shower and wash wound with dial antibacterial soap and water prior to dressing change. Peri-Wound Care: Sween Lotion (Moisturizing lotion) 1 x Per Day/30 Days Discharge Instructions: Apply moisturizing lotion as directed Topical: Gentamicin 1 x Per Day/30 Days Discharge Instructions: As directed by physician Prim Dressing: KerraCel Ag Gelling Fiber Dressing, 4x5 in (silver alginate) (Generic) 1 x Per Day/30 Days ary Discharge Instructions: Apply silver alginate to wound bed as instructed Secondary Dressing: ALLEVYN Heel 4 1/2in x 5 1/2in / 10.5cm x 13.5cm (Generic) 1 x Per Day/30 Days Discharge Instructions: Apply over primary dressing as directed. Secondary Dressing: Zetuvit Plus Silicone Border Dressing 5x5 (in/in) (Generic) 1 x Per Day/30 Days Discharge Instructions: Apply silicone border over primary dressing as directed. Secured With: 88M Medipore Public affairs consultant Surgical T 2x10 (in/yd) 1 x Per Day/30 Days ape Discharge Instructions: Secure with tape as directed. [name], [name] ([id number]) [id number] Page 5 of 11 Compression Wrap: Kerlix Roll 4.5x3.1 (in/yd) (Generic) 1 x Per Day/30 Days Discharge Instructions: Apply Kerlix and Coban compression as directed. Compression Wrap: Coban Self-Adherent Wrap 4x5 (in/yd) (Generic) 1 x Per Day/30 Days Discharge Instructions: Apply over Kerlix as directed. GLYCEMIA INTERVENTIONS PROTOCOL PRE-HBO GLYCEMIA INTERVENTIONS ACTION INTERVENTION Obtain pre-HBO capillary blood glucose (ensure 1 physician order is in chart). A. Notify HBO physician and await physician orders. 2 If result is 70 mg/dl or below: B. If the result meets the hospital definition of a critical result, follow hospital policy. A. Give patient an 8 ounce Glucerna  Shake, an 8 ounce Ensure, or 8 ounces of a Glucerna/Ensure equivalent dietary supplement*. B. Wait 30 minutes. If result is 71 mg/dl to 130 mg/dl: C. Retest patients capillary blood glucose (CBG). D. If result greater than or equal to 110 mg/dl, proceed with HBO. If result less than 110 mg/dl, notify HBO physician and consider holding HBO. If result is 131 mg/dl to 249 mg/dl: A. Proceed with HBO. A. Notify HBO physician and await physician orders. B. It is recommended to hold HBO and do If result is 250 mg/dl or greater: blood/urine ketone testing. C. If the result meets the hospital definition of a critical result, follow hospital policy. POST-HBO GLYCEMIA INTERVENTIONS ACTION INTERVENTION Obtain post HBO capillary blood glucose (ensure 1 physician order is in chart). A. Notify HBO physician and await physician orders. 2 If result is 70 mg/dl or below: B. If the result meets the hospital definition of a critical result, follow hospital policy. A. Give patient an 8 ounce Glucerna  Shake, an 8 ounce Ensure, or 8 ounces of a Glucerna/Ensure equivalent dietary supplement*. B. Wait 15 minutes for symptoms of If result is 71 mg/dl to 100 mg/dl: hypoglycemia (i.e. nervousness, anxiety, sweating, chills, clamminess, irritability, confusion, tachycardia or dizziness). C. If patient asymptomatic, discharge patient. If patient symptomatic, repeat capillary blood glucose (CBG) and notify HBO physician. If result is 101 mg/dl to 249 mg/dl: A. Discharge patient. A. Notify HBO physician and await physician orders. B. It is recommended to do blood/urine ketone If result is 250 mg/dl or greater: testing. C. If the result meets the hospital definition of a critical result, follow hospital policy. *Juice or candies are NOT equivalent products. If patient refuses the Glucerna or Ensure, please consult the hospital dietitian for an appropriate substitute. Electronic  Signature(s) Signed: 10/18/2022 12:07:14 PM By: Fredirick Maudlin MD FACS Entered By: Fredirick Maudlin on 10/18/2022 09:20:40 -------------------------------------------------------------------------------- Problem List Details Patient Name: Date of Service: [name], [name] 10/18/2022 8:00 A M Medical Record Number: [id number] Patient Account Number: [id number] Date of Birth/Sex: Treating RN: Oct 01, 1956 (66 y.o. 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 10/18/2022 8:00 A M Medical Record Number: [id number] Patient Account Number: [id number] Date of Birth/Sex: Treating RN: 27-Jan-1956 (66 y.o. M) Primary Care Provider: Nelda Bucks Other Clinician: Referring Provider: Treating Provider/Extender: Doristine Bosworth in Treatment: 5 Subjective Chief Complaint Information obtained from Patient Patients presents for treatment of an open diabetic ulcer and evaluation  for hyperbaric oxygen therapy History of Present Illness (HPI) ADMISSION 09/10/2022 This is a 66 year old poorly controlled type II diabetic (last hemoglobin A1c 10.8%) who has had an ulcer on his heel for over 3 years. He has been seen in multiple wound care centers, including [facility] and [facility]. He reports that at least 3 doctors have recommended that he undergo below-knee amputation. He most recently met with Dr. Catalina Gravel, a vascular surgeon affiliated with [facility]. 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 Unfortunately, the patient has some extenuating social circumstances including the fact that he cares for his wife who has stage IV colon cancer and still works, driving vehicles for [employer]. 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 He was recently hospitalized at [facility] for sepsis and currently has a PICC line through which he receives IV antibiotics. He reports having had another MRI during that hospital stay along with a chest x-ray and EKG. He apparently contacted one of the hyperbaric therapy techs here and asked a number of questions about hyperbaric oxygen treatments. He subsequently self-referred to our center to undergo further evaluation and management. I mention to him that  [facility] actually has hyperbaric chambers, but he states that he lives in [place] and this would be more convenient for him given the intensive nature of the therapy and time requirement. ABI in clinic today was 0.94. The patient actually has 2 wounds. There is a wound on the dorsum of his left foot with heavy black eschar and slough present. After debridement, this was demonstrated to involve the muscle and the extensor tendons are exposed. On his heel, there is essentially a "shark bite" type wound, with much of the heel fat pad absent. The muscle layer is exposed.  There is blue-green staining around the perimeter of the wound, but no significant odor. He says he has been applying collagen to the wound on his heel and Silvadene and Betadine to the wound on his dorsal foot. 09/20/2022: The heel wound is quite macerated with wet periwound callus. There is slough accumulation on the surface. The dorsal foot wound looks better this week. There is still exposed tendon, but it is fairly clean with just a little biofilm buildup. We are still working on gathering the required documentation to submit for pretreatment review for hyperbaric oxygen therapy. 09/29/2022: The dorsal foot wound continues to improve. I do not see any exposed tendon at this point. There is just some slough accumulation on the wound surface. He continues to have very wet macerated periwound callus on his heel. There is slough on the surface, but it is loose and thin. There is an area of undermining at the 11 o'clock position, but the overlying skin and subcutaneous tissue is healthy and viable. He has been approved for hyperbaric oxygen therapy and will start treatment tomorrow. 10/06/2022: Continued contraction and improvement of the dorsal foot wound. There is just a little bit of slough accumulation on the wound surface. The [name], [name] ([id number]) [id number] Page 7 of 11 periwound callus continues to accumulate on the heel wound and it is quite macerated. It is also persistently found with blue-green staining present. He did initiate his hyperbaric oxygen therapy, but has had significant difficulty with decompression. He will be going to ENT to have PE tubes placed. 10/18/2022: His hyperbaric oxygen therapy is on hold while his otological issues are being addressed. He came in again today with the periwound skin on both the dorsal aspect of his foot and his calcaneus completely macerated. The blue-green discoloration, however, has abated and the undermining on  the calcaneus has improved. The dorsal foot wound has also contracted somewhat. Patient History Family History Cancer - Father,Mother, Diabetes - Mother,Father, Lung Disease - Father, Thyroid Problems - Mother, No family history of Heart Disease, Hereditary Spherocytosis, Hypertension, Kidney Disease, Seizures, Stroke, Tuberculosis. Social History Never smoker, Marital Status - Married, Alcohol Use - Never, Drug Use - No History, Caffeine Use - Daily. Medical History Eyes Patient has history of Cataracts Denies history of Glaucoma, Optic Neuritis Ear/Nose/Mouth/Throat Denies history of Chronic sinus problems/congestion, Middle ear problems Respiratory Patient has history of Sleep Apnea Cardiovascular Patient has history of Hypertension Gastrointestinal Denies history of Cirrhosis , Colitis, Crohnoos, Hepatitis A, Hepatitis B, Hepatitis C Endocrine Patient has history of Type II Diabetes Immunological Denies history of Lupus Erythematosus, Raynaudoos, Scleroderma Musculoskeletal Patient has history of Osteomyelitis - 2023 Neurologic Patient has history of Neuropathy - Bila lower extremities Oncologic Denies history of Received Chemotherapy, Received Radiation Psychiatric Denies history of Anorexia/bulimia, Confinement Anxiety Hospitalization/Surgery History - I and D Left calcaneus. - back surgery- laminectomy. - eye surgery- Bila cataracts. - shoulder  arthroscopy. Medical A Surgical History Notes nd Cardiovascular hyperlipidemia Genitourinary AKI Objective Constitutional Hypertensive, asymptomatic. No acute distress.. Vitals Time Taken: 8:04 AM, Height: 74 in, Weight: 245 lbs, BMI: 31.5, Temperature: 98.3 F, Pulse: 93 bpm, Respiratory Rate: 16 breaths/min, Blood Pressure: 158/93 mmHg. General Notes: Blood sugar not checked by pt this morning. Respiratory Normal work of breathing on room air.. General Notes: 10/18/2022: He came in again today with the periwound skin  on both the dorsal aspect of his foot and his calcaneus completely macerated. The blue-green discoloration, however, has abated and the undermining on the calcaneus has improved. The dorsal foot wound has also contracted somewhat. Integumentary (Hair, Skin) Wound #1 status is Open. Original cause of wound was Pressure Injury. The date acquired was: 03/09/2019. The wound has been in treatment 5 weeks. The wound is located on the Left Calcaneus. The wound measures 6.5cm length x 5cm width x 0.5cm depth; 25.525cm^2 area and 12.763cm^3 volume. There is Fat Layer (Subcutaneous Tissue) exposed. There is no tunneling noted, however, there is undermining starting at 9:00 and ending at 11:00 with a maximum distance of 1.6cm. There is a medium amount of serosanguineous drainage noted. The wound margin is fibrotic, thickened scar. There is medium (34-66%) red, pink granulation within the wound bed. There is a medium (34-66%) amount of necrotic tissue within the wound bed including Eschar and Adherent Slough. The periwound skin appearance exhibited: Callus, Maceration. The periwound skin appearance did not exhibit: Dry/Scaly. Wound #2 status is Open. Original cause of wound was Pressure Injury. The date acquired was: 07/09/2022. The wound has been in treatment 5 weeks. The wound is located on the Left,Dorsal Foot. The wound measures 3.5cm length x 2.6cm width x 0.1cm depth; 7.147cm^2 area and 0.715cm^3 volume. There is Fat Layer (Subcutaneous Tissue) exposed. There is no tunneling or undermining noted. There is a medium amount of serosanguineous drainage noted. There is medium (34-66%) pink granulation within the wound bed. There is a medium (34-66%) amount of necrotic tissue within the wound bed including Eschar and Adherent Slough. The periwound skin appearance had no abnormalities noted for texture. The periwound skin appearance had no abnormalities noted for color. The periwound skin appearance exhibited:  Maceration. Periwound temperature was noted as No Abnormality. 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 There was a Excisional Skin/Subcutaneous Tissue Debridement with a total area of 32.5 sq cm performed by Fredirick Maudlin, MD. With the following instrument(s): Curette to remove Viable and Non-Viable tissue/material. Material removed includes Callus, Subcutaneous Tissue, Slough, and Skin: Epidermis. A time out was conducted at 08:24, prior to the start of the procedure. A Minimum amount of bleeding was controlled with Pressure. The procedure was tolerated well. Post Debridement Measurements: 6.5cm length x 5cm width x 0.5cm depth; 12.763cm^3 volume. Character of Wound/Ulcer Post Debridement requires further debridement. Severity of Tissue Post Debridement is: Fat layer exposed. Post procedure Diagnosis Wound #1: Same as Pre-Procedure General Notes: Scribed for Dr. Celine Ahr by Blanche East, RN. Wound #2 Pre-procedure diagnosis of Wound #2 is a  Diabetic Wound/Ulcer of the Lower Extremity located on the Left,Dorsal Foot .Severity of Tissue Pre Debridement is: Fat layer exposed. There was a Excisional Skin/Subcutaneous Tissue Debridement with a total area of 9.1 sq cm performed by Fredirick Maudlin, MD. With the following instrument(s): Curette to remove Viable and Non-Viable tissue/material. Material removed includes Subcutaneous Tissue and Slough and.  No specimens were taken. A time out was conducted at 08:24, prior to the start of the procedure. A Minimum amount of bleeding was controlled with Pressure. The procedure was tolerated well. Post Debridement Measurements: 3.5cm length x 2.6cm width x 0.1cm depth; 0.715cm^3 volume. Character of Wound/Ulcer Post Debridement requires further debridement. Severity of Tissue Post Debridement is: Fat layer exposed. Post procedure Diagnosis Wound #2: Same as Pre-Procedure General Notes: Scribed for Dr. Celine Ahr by Blanche East, RN. Plan Follow-up Appointments: Return Appointment in 1 week. - Dr. Celine Ahr Rm 3 Tuesday 10/26/22 at 930 Bathing/ Shower/ Hygiene: May shower with protection but do not get wound dressing(s) wet. Edema Control - Lymphedema / SCD / Other: Elevate legs to the level of the heart or above for 30 minutes daily and/or when sitting, a frequency of: Avoid standing for long periods of time. Hyperbaric Oxygen Therapy: Wound #1 Left Calcaneus: Evaluate for HBO Therapy Indication: - chronic refractory osteomyelitis If appropriate for treatment, begin HBOT per protocol: 2.5 ATA for 90 Minutes with 2 Five (5) Minute Air Breaks T Number of Treatments: - 40 otal One treatments per day (delivered Monday through Friday unless otherwise specified in Special Instructions below): Finger stick Blood Glucose Pre- and Post- HBOT Treatment. Follow Hyperbaric Oxygen Glycemia Protocol Afrin (Oxymetazoline HCL) 0.05% nasal spray - 1 spray in both nostrils daily as needed prior to HBO treatment for difficulty clearing ears WOUND #1: - Calcaneus Wound Laterality: Left Cleanser: Soap and Water 1 x Per Day/30 Days Discharge Instructions: May shower and wash wound with dial antibacterial soap and water prior to dressing change. Peri-Wound Care: Sween Lotion (Moisturizing lotion) 1 x Per Day/30 Days Discharge Instructions: Apply moisturizing lotion as directed Topical: Gentamicin 1 x Per Day/30  Days Discharge Instructions: As directed by physician Prim Dressing: KerraCel Ag Gelling Fiber Dressing, 4x5 in (silver alginate) (Generic) 1 x Per Day/30 Days ary Discharge Instructions: Apply silver alginate to wound bed as instructed Secondary Dressing: ABD Pad, 5x9 (Generic) 1 x Per Day/30 Days Discharge Instructions: Apply over primary dressing as directed. Secondary Dressing: Zetuvit Plus Silicone Border Dressing 5x5 (in/in) 1 x Per Day/30 Days Discharge Instructions: Apply silicone border over primary dressing as directed. Secured With: 45M Medipore Public affairs consultant Surgical T 2x10 (in/yd) 1 x Per Day/30 Days ape Discharge Instructions: Secure with tape as directed. Com pression Wrap: Kerlix Roll 4.5x3.1 (in/yd) (Generic) 1 x Per Day/30 Days Discharge Instructions: Apply Kerlix and Coban compression as directed. Com pression Wrap: Coban Self-Adherent Wrap 4x5 (in/yd) (Generic) 1 x Per Day/30 Days Discharge Instructions: Apply over Kerlix as directed. WOUND #2: - Foot Wound Laterality: Dorsal, Left Cleanser: Soap and Water 1 x Per Day/30 Days Discharge Instructions: May shower and wash wound with dial antibacterial soap and water prior to dressing change. Peri-Wound Care: Sween Lotion (Moisturizing lotion) 1 x Per Day/30 Days Discharge Instructions: Apply moisturizing lotion as directed [name], [name] ([id number]) [id number] Page 9 of 11 Topical: Gentamicin 1 x Per Day/30 Days Discharge Instructions: As directed by physician Prim Dressing: KerraCel Ag Gelling Fiber Dressing, 4x5 in (silver alginate) (Generic) 1 x Per Day/30 Days ary Discharge Instructions: Apply silver alginate  to wound bed as instructed Secondary Dressing: ALLEVYN Heel 4 1/2in x 5 1/2in / 10.5cm x 13.5cm (Generic) 1 x Per Day/30 Days Discharge Instructions: Apply over primary dressing as directed. Secondary Dressing: Zetuvit Plus Silicone Border Dressing 5x5 (in/in) (Generic) 1 x Per Day/30  Days Discharge Instructions: Apply silicone border over primary dressing as directed. Secured With: 61M Medipore Public affairs consultant Surgical T 2x10 (in/yd) 1 x Per Day/30 Days ape Discharge Instructions: Secure with tape as directed. Com pression Wrap: Kerlix Roll 4.5x3.1 (in/yd) (Generic) 1 x Per Day/30 Days Discharge Instructions: Apply Kerlix and Coban compression as directed. Com pression Wrap: Coban Self-Adherent Wrap 4x5 (in/yd) (Generic) 1 x Per Day/30 Days Discharge Instructions: Apply over Kerlix as directed. 10/18/2022: He came in again today with the periwound skin on both the dorsal aspect of his foot and his calcaneus completely macerated. The blue-green discoloration, however, has abated and the undermining on the calcaneus has improved. The dorsal foot wound has also contracted somewhat. I used a curette to debride slough and nonviable subcutaneous tissue off of the dorsum of his foot. I debrided callus, skin, slough, and subcutaneous tissue off the calcaneus. He will complete his course of oral levofloxacin. We will use his Keystone topical compounded antibiotic with silver alginate. Periwound zinc oxide and additional absorptive layers to try and minimize tissue maceration. He has an appointment with ENT on November 13, after which hopefully he will be able to resume his hyperbaric oxygen treatments. Follow-up with me in 1 week's time. Electronic Signature(s) Signed: 10/19/2022 7:33:06 AM By: Fredirick Maudlin MD FACS Signed: 02/07/2023 4:57:13 PM By: Blanche East RN Previous Signature: 10/18/2022 9:22:17 AM Version By: Fredirick Maudlin MD FACS Entered By: Blanche East on 10/18/2022 16:44:10 -------------------------------------------------------------------------------- HxROS Details Patient Name: Date of Service: [name], [name]. 10/18/2022 8:00 A M Medical Record Number: [id number] Patient Account Number: [id number] Date of Birth/Sex: Treating RN: 1956/08/02 (66 y.o. M) Primary Care  Provider: Nelda Bucks Other Clinician: Referring Provider: Treating Provider/Extender: Doristine Bosworth in Treatment: 5 Eyes Medical History: Positive for: Cataracts Negative for: Glaucoma; Optic Neuritis Ear/Nose/Mouth/Throat Medical History: Negative for: Chronic sinus problems/congestion; Middle ear problems Respiratory Medical History: Positive for: Sleep Apnea Cardiovascular Medical History: Positive for: Hypertension Past Medical History Notes: hyperlipidemia Gastrointestinal Medical History: Negative for: Cirrhosis ; Colitis; Crohns; Hepatitis A; Hepatitis B; Hepatitis C Endocrine Medical History: Positive for: Type II Diabetes Time with diabetes: 20 years Treated with: Insulin Blood sugar tested every day: Yes Tested : 2-3 [name], [name] ([id number]) [id number] Page 10 of 11 Genitourinary Medical History: Past Medical History Notes: AKI Immunological Medical History: Negative for: Lupus Erythematosus; Raynauds; Scleroderma Musculoskeletal Medical History: Positive for: Osteomyelitis - 2023 Neurologic Medical History: Positive for: Neuropathy - Bila lower extremities Oncologic Medical History: Negative for: Received Chemotherapy; Received Radiation Psychiatric Medical History: Negative for: Anorexia/bulimia; Confinement Anxiety HBO Extended History Items Eyes: Cataracts Immunizations Pneumococcal Vaccine: Received Pneumococcal Vaccination: Yes Received Pneumococcal Vaccination On or After 60th Birthday: Yes Implantable Devices Yes Hospitalization / Surgery History Type of Hospitalization/Surgery I and D Left calcaneus back surgery- laminectomy eye surgery- Bila cataracts shoulder arthroscopy Family and Social History Cancer: Yes - Father,Mother; Diabetes: Yes - Mother,Father; Heart Disease: No; Hereditary Spherocytosis: No; Hypertension: No; Kidney Disease: No; Lung Disease: Yes -  Father; Seizures: No; Stroke: No; Thyroid Problems: Yes - Mother; Tuberculosis: No; Never smoker; Marital Status - Married; Alcohol Use: Never; Drug Use: No History; Caffeine Use: Daily; Financial Concerns: No; Food, Games developer or Shelter  Needs: No; Support System Lacking: No; Transportation Concerns: No Electronic Signature(s) Signed: 10/18/2022 12:07:14 PM By: Fredirick Maudlin MD FACS Entered By: Fredirick Maudlin on 10/18/2022 09:19:42 -------------------------------------------------------------------------------- SuperBill Details Patient Name: Date of Service: [name], [name]. 10/18/2022 Medical Record Number: [id number] Patient Account Number: [id number] Date of Birth/Sex: Treating RN: [birth year] (66 y.o. 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 Facility Procedures : CPT4 Code: [id number] Description: [id number] - DEB SUBQ TISSUE 20 SQ CM/< ICD-10 Diagnosis Description 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 Modifier: Quantity: 1 : CPT4 Code: [id number] Description: [id number] - DEB SUBQ TISS EA ADDL 20CM ICD-10 Diagnosis Description L97.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 L97.424 Non-pressure chronic ulcer of left heel and midfoot with necrosis of  bone Modifier: Quantity: 2 Physician Procedures : CPT4 Code Description Modifier [id number] - WC PHYS LEVEL 4 - EST PT 25 ICD-10 Diagnosis Description 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, 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 Quantity: 1 : [id number] - WC PHYS SUBQ TISS 20 SQ CM ICD-10 Diagnosis Description 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

## 2022-10-26 ENCOUNTER — Encounter (HOSPITAL_BASED_OUTPATIENT_CLINIC_OR_DEPARTMENT_OTHER): Payer: Medicare Other | Admitting: General Surgery

## 2022-10-26 DIAGNOSIS — E11621 Type 2 diabetes mellitus with foot ulcer: Secondary | ICD-10-CM | POA: Diagnosis not present

## 2022-10-26 LAB — GLUCOSE, CAPILLARY: Glucose-Capillary: 65 mg/dL — ABNORMAL LOW (ref 70–99)

## 2022-10-26 NOTE — Progress Notes (Signed)
TOD, SCHARR R ([id number]) [id number] Page 1 of 9 Visit Report for 10/26/2022 Arrival Information Details Patient Name: Date of Service: [name], [name] [name] 10/26/2022 9:30 A M Medical Record Number: [id number] Patient Account Number: [id number] Date of Birth/Sex: Treating RN: [birth year] (66 y.o. Jimmey Ralph, [place] Primary Care Carys Malina: Nelda Bucks Other Clinician: Referring Ottie Tillery: Treating Nasra Counce/Extender: Doristine Bosworth in Treatment: 6 Visit Information History Since Last Visit Added or deleted any medications: No Patient Arrived: Knee Scooter Any new allergies or adverse reactions: No Arrival Time: 09:46 Had a fall or experienced change in No Accompanied By: self activities of daily living that may affect Transfer Assistance: None risk of falls: Patient Identification Verified: Yes Signs or symptoms of abuse/neglect since last visito No Secondary Verification Process Completed: Yes Hospitalized since last visit: No Patient Requires Transmission-Based Precautions: No Implantable device outside of the clinic excluding No Patient Has Alerts: No cellular tissue based products placed in the center since last visit: Has Dressing in Place as Prescribed: Yes Pain Present Now: No Electronic Signature(s) Signed: 10/26/2022 3:06:09 PM By: Blanche East RN Entered By: Blanche East on 10/26/2022 09:47:00 -------------------------------------------------------------------------------- Encounter Discharge Information Details Patient Name: Date of Service: [name], [name] NA LD R. 10/26/2022 9:30 A M Medical Record Number: [id number] Patient Account Number: [id number] Date of Birth/Sex: Treating RN: Apr 28, 1956 (66 y.o. [name] Primary Care Bashar Milam: Nelda Bucks Other Clinician: Referring Julious Langlois: Treating Rolondo Pierre/Extender: Doristine Bosworth in Treatment: 6 Encounter Discharge Information Items  Post Procedure Vitals Discharge Condition: Stable Temperature (F): 98.4 Ambulatory Status: Knee Scooter Pulse (bpm): 98 Discharge Destination: Home Respiratory Rate (breaths/min): 18 Transportation: Private Auto Blood Pressure (mmHg): 135/78 Accompanied By: self Schedule Follow-up Appointment: Yes Clinical Summary of Care: Electronic Signature(s) Signed: 10/26/2022 3:06:09 PM By: Blanche East RN Entered By: Blanche East on 10/26/2022 10:22:10 [name] ([id number] Page 2 of 9 -------------------------------------------------------------------------------- Lower Extremity Assessment Details Patient Name: Date of Service: [name], [name] [name] 10/26/2022 9:30 A M Medical Record Number: [id number] Patient Account Number: [id number] Date of Birth/Sex: Treating RN: August 05, 1956 (66 y.o. [name] Primary Care Marissa Lowrey: Nelda Bucks Other Clinician: Referring Jalexis Breed: Treating Maretta Overdorf/Extender: Doristine Bosworth in Treatment: 6 Edema Assessment Assessed: [name]: No] [Right: No] [Left: Edema] [Right: :] Calf Left: Right: Point of Measurement: From Medial Instep 45.5 cm Ankle Left: Right: Point of Measurement: From Medial Instep 26 cm Vascular Assessment Pulses: Dorsalis Pedis Palpable: [Left:Yes] Electronic Signature(s) Signed: 10/26/2022 3:06:09 PM By: Blanche East RN Entered By: Blanche East on 10/26/2022 09:48:01 -------------------------------------------------------------------------------- Multi Wound Chart Details Patient Name: Date of Service: [name], [name] NA LD R. 10/26/2022 9:30 A M Medical Record Number: [id number] Patient Account Number: [id number] Date of Birth/Sex: Treating RN: 04/25/1956 (66 y.o. M) Primary Care Shontell Prosser: Nelda Bucks Other Clinician: Referring TRUE Shackleford: Treating Tahani Potier/Extender: Doristine Bosworth in Treatment: 6 Vital Signs Height(in):  74 Capillary Blood Glucose(mg/dl): 201 Weight(lbs): 245 Pulse(bpm): 98 Body Mass Index(BMI): 31.5 Blood Pressure(mmHg): 135/78 Temperature(F): 98.4 Respiratory Rate(breaths/min): 18 [1:Photos:] [N/A:N/A [id number] Page 3 of 9] Left Calcaneus Left, Dorsal Foot N/A Wound Location: Pressure Injury Pressure Injury N/A Wounding Event: Diabetic Wound/Ulcer of the Lower Diabetic Wound/Ulcer of the Lower N/A Primary Etiology: Extremity Extremity Cataracts, Sleep Apnea, Hypertension, Cataracts, Sleep Apnea, Hypertension, N/A Comorbid History: Type II Diabetes, Osteomyelitis, Type II Diabetes, Osteomyelitis, Neuropathy Neuropathy 03/09/2019 07/09/2022 N/A Date Acquired: 6 6 N/A Weeks of Treatment: Open Open N/A Wound Status: No No N/A Wound Recurrence: 6.5x5x0.4  3x2.2x0.1 N/A Measurements L x W x D (cm) 25.525 5.184 N/A A (cm) : rea 10.21 0.518 N/A Volume (cm) : -18.20% 45.00% N/A % Reduction in A rea: 52.70% 45.00% N/A % Reduction in Volume: 10 Starting Position 1 (o'clock): 1 Ending Position 1 (o'clock): 1.4 Maximum Distance 1 (cm): Yes No N/A Undermining: Grade 3 Grade 2 N/A Classification: Medium Medium N/A Exudate A mount: Serosanguineous Serosanguineous N/A Exudate Type: red, brown red, brown N/A Exudate Color: Fibrotic scar, thickened scar N/A N/A Wound Margin: Medium (34-66%) Medium (34-66%) N/A Granulation A mount: Red, Pink Pink N/A Granulation Quality: Medium (34-66%) Medium (34-66%) N/A Necrotic A mount: Eschar, Adherent Slough Eschar, Adherent Slough N/A Necrotic Tissue: Fat Layer (Subcutaneous Tissue): Yes Fat Layer (Subcutaneous Tissue): Yes N/A Exposed Structures: Fascia: No Fascia: No Tendon: No Tendon: No Muscle: No Muscle: No Joint: No Joint: No Bone: No Bone: No Small (1-33%) Medium (34-66%) N/A Epithelialization: Debridement - Selective/Open Wound Debridement - Excisional  N/A Debridement: Pre-procedure Verification/Time Out 09:58 09:58 N/A Taken: Callus Subcutaneous, Slough N/A Tissue Debrided: Skin/Epidermis Skin/Subcutaneous Tissue N/A Level: 32.5 6.6 N/A Debridement A (sq cm): rea Curette Curette N/A Instrument: Minimum Minimum N/A Bleeding: Pressure Pressure N/A Hemostasis A chieved: 0 0 N/A Procedural Pain: 0 0 N/A Post Procedural Pain: Procedure was tolerated well Procedure was tolerated well N/A Debridement Treatment Response: 6.5x5x0.4 3x2.2x0.1 N/A Post Debridement Measurements L x W x D (cm) 10.21 0.518 N/A Post Debridement Volume: (cm) Callus: Yes No Abnormalities Noted N/A Periwound Skin Texture: Maceration: Yes Maceration: Yes N/A Periwound Skin Moisture: Dry/Scaly: No Erythema: Yes N/A Periwound Skin Color: N/A No Abnormality N/A Temperature: Debridement Debridement N/A Procedures Performed: Treatment Notes Electronic Signature(s) Signed: 10/26/2022 10:07:41 AM By: [name] MD FACS Entered By: [name] on 10/26/2022 10:07:41 -------------------------------------------------------------------------------- Multi-Disciplinary Care Plan Details Patient Name: Date of Service: [name], [name] NA LD R. 10/26/2022 9:30 A M Medical Record Number: [id number] Patient Account Number: [id number] Date of Birth/Sex: Treating RN: 1956/08/09 (66 y.o. 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 and treatment procedures Potential for barotraumas to ears, sinuses, teeth, and lungs or cerebral gas embolism related to changes in atmospheric pressure inside  hyperbaric oxygen chamber Potential for oxygen toxicity seizures related to delivery of 100% oxygen at an increased atmospheric pressure Potential for pulmonary oxygen toxicity related to delivery of 100% oxygen at an increased atmospheric pressure Goals: Barotrauma will be prevented during HBO2 Date Initiated: 10/06/2022 Target Resolution Date: 11/03/2022 Goal Status: Active Patient and/or family will be able to state/discuss factors appropriate to the management of their disease process during treatment Date Initiated: 10/06/2022 Target Resolution Date: 11/03/2022 Goal Status: Active Patient will tolerate the hyperbaric oxygen therapy treatment Date Initiated: 10/06/2022 Target Resolution Date: 11/03/2022 Goal Status: Active Patient/caregiver will verbalize understanding of HBO goals, rationale, procedures and potential hazards Date Initiated: 10/06/2022 Target Resolution Date: 11/03/2022 Goal Status: Active Interventions: Administer decongestants, per physician orders, prior to HBO2 Administer the correct therapeutic gas delivery based on the patients needs and limitations, per physician order Assess and provide for patients comfort related to the hyperbaric environment and equalization of middle ear Assess for signs and symptoms related to adverse events, including but not limited to confinement anxiety, pneumothorax, oxygen toxicity and baurotrauma Notes: Wound/Skin  Impairment Nursing Diagnoses: Impaired tissue integrity Goals: Patient/caregiver will verbalize understanding of skin care regimen Date Initiated: 10/06/2022 Target Resolution Date: 11/03/2022 Goal Status: Active Ulcer/skin breakdown will have a volume reduction of 30% by week 4 Date Initiated: 09/10/2022 Date Inactivated: 10/06/2022 Target Resolution Date: 10/08/2022 Goal Status: Unmet Unmet Reason: osteo, HBOT Ulcer/skin breakdown will have a volume reduction of 50% by week 8 Date Initiated:  10/06/2022 Target Resolution Date: 11/03/2022 Goal Status: Active Interventions: Assess ulceration(s) every visit Provide education on ulcer and skin care Treatment Activities: Consult for HBO : 09/10/2022 Skin care regimen initiated : 09/10/2022 Notes: Electronic Signature(s) Signed: 10/26/2022 3:06:09 PM By: Blanche East RN Entered By: Blanche East on 10/26/2022 09:53:55 [name] ([id number] Page 5 of 9 -------------------------------------------------------------------------------- Pain Assessment Details Patient Name: Date of Service: [name], [name] [name] 10/26/2022 9:30 A M Medical Record Number: [id number] Patient Account Number: [id number] Date of Birth/Sex: Treating RN: March 30, 1956 (66 y.o. [name] Primary Care Lyndzie Zentz: Nelda Bucks Other Clinician: Referring Jonuel Butterfield: Treating Flornce Record/Extender: Doristine Bosworth in Treatment: 6 Active Problems Location of Pain Severity and Description of Pain Patient Has Paino Yes Site Locations Rate the pain. Current Pain Level: 3 Character of Pain Describe the Pain: Aching Pain Management and Medication Current Pain Management: Electronic Signature(s) Signed: 10/26/2022 3:06:09 PM By: Blanche East RN Entered By: Blanche East on 10/26/2022 09:47:31 -------------------------------------------------------------------------------- Patient/Caregiver Education Details Patient Name: Date of Service: Hollace Hayward NA LD R. 11/14/2023andnbsp9:30 A M Medical Record Number: [id number] Patient Account Number: [id number] Date of Birth/Gender: Treating RN: 02-22-1956 (66 y.o. [name] Primary Care Physician: Nelda Bucks Other Clinician: Referring Physician: Treating Physician/Extender: Doristine Bosworth in Treatment: 6 Education Assessment Education Provided To: Patient Education Topics Provided Wound/Skin  Impairment: Methods: Explain/Verbal Responses: Reinforcements needed, State content correctly Electronic Signature(s) HUGHES, KRAPP R ([id number]) [id number] Page 6 of 9 Signed: 10/26/2022 3:06:09 PM By: Blanche East RN Entered By: Blanche East on 10/26/2022 09:54:07 -------------------------------------------------------------------------------- Wound Assessment Details Patient Name: Date of Service: [name], [name] [name] 10/26/2022 9:30 A M Medical Record Number: [id number] Patient Account Number: [id number] Date of Birth/Sex: Treating RN: 23-May-1956 (66 y.o. [name] Primary Care Bevin Das: Nelda Bucks Other Clinician: Referring Rakisha Pincock: Treating Lakendrick Paradis/Extender: Doristine Bosworth in Treatment: 6 Wound Status Wound Number: 1 Primary Diabetic Wound/Ulcer of the Lower Extremity Etiology: Wound Location: Left Calcaneus Wound Open Wounding Event: Pressure Injury Status: Date Acquired: 03/09/2019 Comorbid Cataracts, Sleep Apnea, Hypertension, Type II Diabetes, Weeks Of Treatment: 6 History: Osteomyelitis, Neuropathy Clustered Wound: No Photos Wound Measurements Length: (cm) 6.5 Width: (cm) 5 Depth: (cm) 0.4 Area: (cm) 25.525 Volume: (cm) 10.21 % Reduction in Area: -18.2% % Reduction in Volume: 52.7% Epithelialization: Small (1-33%) Tunneling: No Undermining: Yes Starting Position (o'clock): 10 Ending Position (o'clock): 1 Maximum Distance: (cm) 1.4 Wound Description Classification: Grade 3 Wound Margin: Fibrotic scar, thickened scar Exudate Amount: Medium Exudate Type: Serosanguineous Exudate Color: red, brown Foul Odor After Cleansing: No Slough/Fibrino Yes Wound Bed Granulation Amount: Medium (34-66%) Exposed Structure Granulation Quality: Red, Pink Fascia Exposed: No Necrotic Amount: Medium (34-66%) Fat Layer (Subcutaneous Tissue) Exposed: Yes Necrotic Quality: Eschar, Adherent Slough Tendon  Exposed: No Muscle Exposed: No Joint Exposed: No Bone Exposed: No Periwound Skin Texture Texture Color No Abnormalities Noted: No No Abnormalities Noted: No Callus[address] BARTLEY, AASE R ([id number]) [id number] Page 7 of 9 No Abnormalities Noted: No Dry / Scaly: No Maceration: Yes Treatment Notes Wound #1 (Calcaneus) Wound  Laterality: Left Cleanser Soap and Water Discharge Instruction: May shower and wash wound with dial antibacterial soap and water prior to dressing change. Peri-Wound Care Sween Lotion (Moisturizing lotion) Discharge Instruction: Apply moisturizing lotion as directed Topical Gentamicin Discharge Instruction: As directed by physician Primary Dressing KerraCel Ag Gelling Fiber Dressing, 4x5 in (silver alginate) Discharge Instruction: Apply silver alginate to wound bed as instructed Secondary Dressing ABD Pad, 5x9 Discharge Instruction: Apply over primary dressing as directed. Zetuvit Plus Silicone Border Dressing 5x5 (in/in) Discharge Instruction: Apply silicone border over primary dressing as directed. Secured With [employer] Surgical T 2x10 (in/yd) ape Discharge Instruction: Secure with tape as directed. Compression Wrap Kerlix Roll 4.5x3.1 (in/yd) Discharge Instruction: Apply Kerlix and Coban compression as directed. Coban Self-Adherent Wrap 4x5 (in/yd) Discharge Instruction: Apply over Kerlix as directed. Compression Stockings Add-Ons Electronic Signature(s) Signed: 10/26/2022 3:06:09 PM By: Blanche East RN Entered By: Blanche East on 10/26/2022 09:52:23 -------------------------------------------------------------------------------- Wound Assessment Details Patient Name: Date of Service: [name], [name] NA LD R. 10/26/2022 9:30 A M Medical Record Number: [id number] Patient Account Number: [id number] Date of Birth/Sex: Treating RN: 09-Feb-1956 (66 y.o. [name] Primary Care Oneita Allmon: Nelda Bucks Other Clinician: Referring [place]: Treating Arnell Mausolf/Extender: Doristine Bosworth in Treatment: 6 Wound Status Wound Number: 2 Primary Diabetic Wound/Ulcer of the Lower Extremity Etiology: Wound Location: Left, Dorsal Foot Wound Open Wounding Event: Pressure Injury Status: Date Acquired: 07/09/2022 Comorbid Cataracts, Sleep Apnea, Hypertension, Type II Diabetes, Weeks Of Treatment: 6 History: Osteomyelitis, Neuropathy Clustered Wound: No [name], [name] ([id number]) [id number].pdf Page 8 of 9 Photos Wound Measurements Length: (cm) 3 Width: (cm) 2.2 Depth: (cm) 0.1 Area: (cm) 5.184 Volume: (cm) 0.518 % Reduction in Area: 45% % Reduction in Volume: 45% Epithelialization: Medium (34-66%) Tunneling: No Undermining: No Wound Description Classification: Grade 2 Exudate Amount: Medium Exudate Type: Serosanguineous Exudate Color: red, brown Foul Odor After Cleansing: No Slough/Fibrino Yes Wound Bed Granulation Amount: Medium (34-66%) Exposed Structure Granulation Quality: Pink Fascia Exposed: No Necrotic Amount: Medium (34-66%) Fat Layer (Subcutaneous Tissue) Exposed: Yes Necrotic Quality: Eschar, Adherent Slough Tendon Exposed: No Muscle Exposed: No Joint Exposed: No Bone Exposed: No Periwound Skin Texture Texture Color No Abnormalities Noted: Yes No Abnormalities Noted: Yes Moisture Temperature / Pain No Abnormalities Noted: No Temperature: No Abnormality Maceration: Yes Treatment Notes Wound #2 (Foot) Wound Laterality: Dorsal, Left Cleanser Soap and Water Discharge Instruction: May shower and wash wound with dial antibacterial soap and water prior to dressing change. Peri-Wound Care Sween Lotion (Moisturizing lotion) Discharge Instruction: Apply moisturizing lotion as directed Topical Gentamicin Discharge Instruction: As directed by physician Primary Dressing KerraCel Ag Gelling Fiber Dressing, 4x5 in  (silver alginate) Discharge Instruction: Apply silver alginate to wound bed as instructed Secondary Dressing ALLEVYN Heel 4 1/2in x 5 1/2in / 10.5cm x 13.5cm Discharge Instruction: Apply over primary dressing as directed. Zetuvit Plus Silicone Border Dressing 5x5 (in/in) Discharge Instruction: Apply silicone border over primary dressing as directed. Secured With [employer] Surgical T 2x10 (in/yd) ape Discharge Instruction: Secure with tape as directed. [name], JANIS R ([id number]) [id number] Page 9 of 9 Compression Wrap Kerlix Roll 4.5x3.1 (in/yd) Discharge Instruction: Apply Kerlix and Coban compression as directed. Coban Self-Adherent Wrap 4x5 (in/yd) Discharge Instruction: Apply over Kerlix as directed. Compression Stockings Add-Ons Electronic Signature(s) Signed: 10/26/2022 3:06:09 PM By: Blanche East RN Entered By: Blanche East on 10/26/2022 09:52:48 -------------------------------------------------------------------------------- Vitals Details Patient Name: Date of Service: [name], [name] NA LD R. 10/26/2022 9:30 A M Medical Record  Number: [id number] Patient Account Number: [id number] Date of Birth/Sex: Treating RN: 17-Dec-1955 (66 y.o. [name] Primary Care Raylan Hanton: Nelda Bucks Other Clinician: Referring Antrell Tipler: Treating Abram Sax/Extender: Doristine Bosworth in Treatment: 6 Vital Signs Time Taken: 09:47 Temperature (F): 98.4 Height (in): 74 Pulse (bpm): 98 Weight (lbs): 245 Respiratory Rate (breaths/min): 18 Body Mass Index (BMI): 31.5 Blood Pressure (mmHg): 135/78 Capillary Blood Glucose (mg/dl): 201 Reference Range: 80 - 120 mg / dl Electronic Signature(s) Signed: 10/26/2022 3:06:09 PM By: Blanche East RN Entered By: Blanche East on 10/26/2022 09:47:21

## 2022-10-26 NOTE — Progress Notes (Signed)
Klingel, Markies [name] ([id number]) [id number] Page 1 of 12 Visit Report for 10/26/2022 Chief Complaint Document Details Patient Name: Date of Service: [name], [name] 10/26/2022 9:30 A M Medical Record Number: [id number] Patient Account Number: [id number] Date of Birth/Sex: Treating RN: 07/23/1956 (66 y.o. M) Primary Care Provider: Schultz, Douglas Other Clinician: Referring Provider: Treating Provider/Extender: Cannon, Jennifer Schultz, Douglas Weeks in Treatment: 6 Information Obtained from: Patient Chief Complaint Patients presents for treatment of an open diabetic ulcer and evaluation for hyperbaric oxygen therapy Electronic Signature(s) Signed: 10/26/2022 10:08:10 AM By: Cannon, Jennifer MD FACS Entered By: Cannon, Jennifer on 10/26/2022 10:08:10 -------------------------------------------------------------------------------- Debridement Details Patient Name: Date of Service: [name], [name] 10/26/2022 9:30 A M Medical Record Number: [id number] Patient Account Number: [id number] Date of Birth/Sex: Treating RN: 01/20/1956 (66 y.o. M) Zochol, Jamie Primary Care Provider: Schultz, Douglas Other Clinician: Referring Provider: Treating Provider/Extender: Cannon, Jennifer Schultz, Douglas Weeks in Treatment: 6 Debridement Performed for Assessment: Wound #1 Left Calcaneus Performed By: Physician Cannon, Jennifer, MD Debridement Type: Debridement Severity of Tissue Pre Debridement: Fat layer exposed Level of Consciousness (Pre-procedure): Awake and Alert Pre-procedure Verification/Time Out Yes - 09:58 Taken: Start Time: 09:59 T Area Debrided (L x W): otal 6.5 (cm) x 5 (cm) = 32.5 (cm) Tissue and other material debrided: Viable, Non-Viable, Callus, Skin: Epidermis, Biofilm Level: Skin/Epidermis Debridement Description: Selective/Open Wound Instrument: Curette Bleeding: Minimum Hemostasis Achieved: Pressure Procedural Pain: 0 Post Procedural Pain:  0 Response to Treatment: Procedure was tolerated well Level of Consciousness (Post- Awake and Alert procedure): Post Debridement Measurements of Total Wound Length: (cm) 6.5 Width: (cm) 5 Depth: (cm) 0.4 Volume: (cm) 10.21 Character of Wound/Ulcer Post Debridement: Requires Further Debridement Severity of Tissue Post Debridement: Fat layer exposed Post Procedure Diagnosis [name], [name] ([id number]) [id number] Page 2 of 12 Same as Pre-procedure Notes Scribed for Dr. Cannon by Jamie Zochol, RN Electronic Signature(s) Signed: 10/26/2022 12:07:49 PM By: Cannon, Jennifer MD FACS Signed: 10/26/2022 3:06:09 PM By: Zochol, Jamie RN Entered By: Zochol, Jamie on 10/26/2022 10:03:25 -------------------------------------------------------------------------------- Debridement Details Patient Name: Date of Service: [name], [name] 10/26/2022 9:30 A M Medical Record Number: [id number] Patient Account Number: [id number] Date of Birth/Sex: Treating RN: 03/07/1956 (66 y.o. M) Zochol, Jamie Primary Care Provider: Schultz, Douglas Other Clinician: Referring Provider: Treating Provider/Extender: Cannon, Jennifer Schultz, Douglas Weeks in Treatment: 6 Debridement Performed for Assessment: Wound #2 Left,Dorsal Foot Performed By: Physician Cannon, Jennifer, MD Debridement Type: Debridement Severity of Tissue Pre Debridement: Fat layer exposed Level of Consciousness (Pre-procedure): Awake and Alert Pre-procedure Verification/Time Out Yes - 09:58 Taken: Start Time: 09:59 T Area Debrided (L x W): otal 3 (cm) x 2.2 (cm) = 6.6 (cm) Tissue and other material debrided: Viable, Non-Viable, Slough, Subcutaneous, Slough Level: Skin/Subcutaneous Tissue Debridement Description: Excisional Instrument: Curette Bleeding: Minimum Hemostasis Achieved: Pressure Procedural Pain: 0 Post Procedural Pain: 0 Response to Treatment: Procedure was tolerated well Level of  Consciousness (Post- Awake and Alert procedure): Post Debridement Measurements of Total Wound Length: (cm) 3 Width: (cm) 2.2 Depth: (cm) 0.1 Volume: (cm) 0.518 Character of Wound/Ulcer Post Debridement: Requires Further Debridement Severity of Tissue Post Debridement: Fat layer exposed Post Procedure Diagnosis Same as Pre-procedure Notes Scribed for Dr. Cannon by Jamie Zochol, RN Electronic Signature(s) Signed: 10/26/2022 12:07:49 PM By: Cannon, Jennifer MD FACS Signed: 10/26/2022 3:06:09 PM By: Zochol, Jamie RN Entered By: Zochol, Jamie on 10/26/2022 10:03:36 [name], [name] ([id number]) [id number] Page 3 of 12 -------------------------------------------------------------------------------- HPI Details Patient Name: Date of Service: [name],   [name] 10/26/2022 9:30 A M Medical Record Number: [id number] Patient Account Number: [id number] Date of Birth/Sex: Treating RN: 06/16/1956 (66 y.o. M) Primary Care Provider: Schultz, Douglas Other Clinician: Referring Provider: Treating Provider/Extender: Cannon, Jennifer Schultz, Douglas Weeks in Treatment: 6 History of Present Illness HPI Description: ADMISSION 09/10/2022 This is a 66-year-old poorly controlled type II diabetic (last hemoglobin A1c 10.8%) who has had an ulcer on his heel for over 3 years. He has been seen in multiple wound care centers, including [facility] and [facility]. He reports that at least 3 doctors have recommended that he undergo below-knee amputation. He most recently met with Dr. Matt Goldman, a vascular surgeon affiliated with [facility]. 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 Unfortunately, the patient has some extenuating social circumstances including the fact that he cares for his wife who has stage IV colon cancer and still works, driving vehicles for [employer]. 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 He was recently hospitalized at [facility] for sepsis and currently has a PICC line through which he receives IV antibiotics. He reports having had another MRI during that hospital stay along with a chest x-ray and EKG. He apparently contacted one of the hyperbaric therapy techs here and asked a number of questions about hyperbaric oxygen treatments. He subsequently self-referred to our center to undergo further evaluation and management. I mention to him that [facility] actually has hyperbaric chambers, but he states that he lives in [place] and this would be more convenient for him given the intensive nature of the therapy and time requirement. ABI in clinic today was 0.94. The patient actually has 2 wounds. There is a wound on the dorsum of his left foot with heavy black eschar and slough present. After debridement, this was demonstrated to involve the muscle and the extensor tendons are exposed. On his heel, there is essentially a "shark bite" type wound, with much of the heel fat pad absent. The muscle layer is exposed. There is blue-green staining around the perimeter of the wound, but no significant odor. He says he has been applying collagen to the wound on his heel and Silvadene and Betadine to the wound on his dorsal foot. 09/20/2022: The heel wound is quite macerated with wet periwound callus. There is slough accumulation on the surface. The dorsal foot wound looks better this week. There is still exposed tendon, but it is fairly clean with just a little biofilm buildup. We are still working on gathering the required documentation to submit for pretreatment review for hyperbaric oxygen therapy. 09/29/2022: The dorsal foot wound continues to improve. I do not see any exposed tendon at this point. There is just some slough accumulation on the wound surface. He continues to have very wet macerated periwound callus on his heel. There is slough  on the surface, but it is loose and thin. There is an area of undermining at the 11 o'clock position, but the overlying skin and subcutaneous tissue is healthy and viable. He has been approved for hyperbaric oxygen therapy and will start treatment tomorrow. 10/06/2022: Continued contraction and improvement of the dorsal foot wound. There is just a little bit of slough accumulation on the wound surface. The periwound callus continues to accumulate on the heel wound and it is quite macerated. It is also persistently found with blue-green staining present. He did initiate his hyperbaric oxygen therapy, but   has had significant difficulty with decompression. He will be going to ENT to have PE tubes placed. 10/18/2022: His hyperbaric oxygen therapy is on hold while his otological issues are being addressed. He came in again today with the periwound skin on both the dorsal aspect of his foot and his calcaneus completely macerated. The blue-green discoloration, however, has abated and the undermining on the calcaneus has improved. The dorsal foot wound has also contracted somewhat. 10/26/2022: The dorsal foot wound continues to contract and fill with good granulation tissue. The heel, once again, has a rim of macerated callus, but the undermining and tunneling continues to contract. He has completed his oral antibiotics and has been using the [facility] topical compounded antibiotic for his dressing changes at home. He is scheduled to see ENT this afternoon. Electronic Signature(s) Signed: 10/26/2022 10:13:44 AM By: Cindie Rajagopalan MD FACS Previous Signature: 10/26/2022 10:09:07 AM Version By: Tersa Fotopoulos MD FACS Entered By: Maricsa Sammons on 10/26/2022 10:13:44 -------------------------------------------------------------------------------- Physical Exam Details Patient Name: Date of Service: [name], [name] 10/26/2022 9:30 A M Medical Record Number: [id number] Patient Account Number:  [id number] Date of Birth/Sex: Treating RN: 10/05/1956 (66 y.o. M) Primary Care Provider: Schultz, Douglas Other Clinician: Referring Provider: Treating Provider/Extender: Kiely Cousar Schultz, Douglas Weeks in Treatment: 6 Constitutional . . . . No acute distress.. Respiratory Normal work of breathing on room air.. Notes Kaley, Long [name] ([id number]) [id number] Page 4 of 12 10/26/2022: The dorsal foot wound continues to contract and fill with good granulation tissue. The heel, once again, has a rim of macerated callus, but the undermining and tunneling continues to contract. Electronic Signature(s) Signed: 10/26/2022 10:14:14 AM By: Shaneika Rossa MD FACS Entered By: Bishop Vanderwerf on 10/26/2022 10:14:13 -------------------------------------------------------------------------------- Physician Orders Details Patient Name: Date of Service: [name], [name] 10/26/2022 9:30 A M Medical Record Number: [id number] Patient Account Number: [id number] Date of Birth/Sex: Treating RN: 11/25/1956 (66 y.o. 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 - Dr. Christophor Eick Rm Return A Monday 11/08/22 at 8:00AM Bathing/ Shower/ Hygiene May shower with protection but do not get wound dressing(s) wet. Edema Control - Lymphedema / SCD / Other Left Lower Extremity Elevate legs to the level of the  heart or above for 30 minutes daily and/or when sitting, a frequency of: Avoid standing for long periods of time. Hyperbaric Oxygen Therapy Wound #1 Left Calcaneus Evaluate for HBO Therapy Indication: - chronic refractory osteomyelitis If appropriate for treatment, begin HBOT per protocol: 2.5 ATA for 90 Minutes with 2 Five (5) Minute A Breaks ir Total Number of Treatments: - 40 One treatments per day (delivered Monday through Friday unless otherwise specified in Special Instructions below): Finger stick Blood Glucose Pre- and Post- HBOT Treatment. Follow Hyperbaric Oxygen Glycemia Protocol A frin (Oxymetazoline HCL) 0.05% nasal spray - 1 spray in both nostrils daily as needed prior to HBO treatment for difficulty clearing ears Wound Treatment Wound #1 - Calcaneus Wound Laterality: Left Cleanser: Soap and Water 1 x Per Day/30 Days Discharge Instructions: May   shower and wash wound with dial antibacterial soap and water prior to dressing change. Peri-Wound Care: Sween Lotion (Moisturizing lotion) 1 x Per Day/30 Days Discharge Instructions: Apply moisturizing lotion as directed Topical: Gentamicin 1 x Per Day/30 Days Discharge Instructions: As directed by physician Prim Dressing: KerraCel Ag Gelling Fiber Dressing, 4x5 in (silver alginate) (Generic) 1 x Per Day/30 Days ary Discharge Instructions: Apply silver alginate to wound bed as instructed Secondary Dressing: ABD Pad, 5x9 (Generic) 1 x Per Day/30 Days Discharge Instructions: Apply over primary dressing as directed. Bertini, Koichi [name] ([id number]) [id number] Page 5 of 12 Secondary Dressing: Zetuvit Plus Silicone Border Dressing 5x5 (in/in) 1 x Per Day/30 Days Discharge Instructions: Apply silicone border over primary dressing as directed. Secured With: 3M Medipore Soft Cloth Surgical T 2x10 (in/yd) 1 x Per Day/30 Days ape Discharge Instructions: Secure with tape as directed. Compression Wrap: Kerlix Roll  4.5x3.1 (in/yd) (Generic) 1 x Per Day/30 Days Discharge Instructions: Apply Kerlix and Coban compression as directed. Compression Wrap: Coban Self-Adherent Wrap 4x5 (in/yd) (Generic) 1 x Per Day/30 Days Discharge Instructions: Apply over Kerlix as directed. Wound #2 - Foot Wound Laterality: Dorsal, Left Cleanser: Soap and Water 1 x Per Day/30 Days Discharge Instructions: May shower and wash wound with dial antibacterial soap and water prior to dressing change. Peri-Wound Care: Sween Lotion (Moisturizing lotion) 1 x Per Day/30 Days Discharge Instructions: Apply moisturizing lotion as directed Topical: Gentamicin 1 x Per Day/30 Days Discharge Instructions: As directed by physician Prim Dressing: KerraCel Ag Gelling Fiber Dressing, 4x5 in (silver alginate) (Generic) 1 x Per Day/30 Days ary Discharge Instructions: Apply silver alginate to wound bed as instructed Secondary Dressing: ALLEVYN Heel 4 1/2in x 5 1/2in / 10.5cm x 13.5cm (Generic) 1 x Per Day/30 Days Discharge Instructions: Apply over primary dressing as directed. Secondary Dressing: Zetuvit Plus Silicone Border Dressing 5x5 (in/in) (Generic) 1 x Per Day/30 Days Discharge Instructions: Apply silicone border over primary dressing as directed. Secured With: 3M Medipore Soft Cloth Surgical T 2x10 (in/yd) 1 x Per Day/30 Days ape Discharge Instructions: Secure with tape as directed. Compression Wrap: Kerlix Roll 4.5x3.1 (in/yd) (Generic) 1 x Per Day/30 Days Discharge Instructions: Apply Kerlix and Coban compression as directed. Compression Wrap: Coban Self-Adherent Wrap 4x5 (in/yd) (Generic) 1 x Per Day/30 Days Discharge Instructions: Apply over Kerlix as directed. GLYCEMIA INTERVENTIONS PROTOCOL PRE-HBO GLYCEMIA INTERVENTIONS ACTION INTERVENTION Obtain pre-HBO capillary blood glucose (ensure 1 physician order is in chart). A. Notify HBO physician and await physician orders. 2 If result is 70 mg/dl or below: B. If the result meets  the hospital definition of a critical result, follow hospital policy. A. Give patient an 8 ounce Glucerna Shake®, an 8 ounce Ensure®, or 8 ounces of a Glucerna/Ensure equivalent dietary supplement*. B. Wait 30 minutes. If result is 71 mg/dl to 130 mg/dl: C. Retest patients capillary blood glucose (CBG). D. If result greater than or equal to 110 mg/dl, proceed with HBO. If result less than 110 mg/dl, notify HBO physician and consider holding HBO. If result is 131 mg/dl to 249 mg/dl: A. Proceed with HBO. A. Notify HBO physician and await physician orders. B. It is recommended to hold HBO and do If result is 250 mg/dl or greater: blood/urine ketone testing. C. If the result meets the hospital definition of a critical result, follow hospital policy. POST-HBO GLYCEMIA INTERVENTIONS ACTION INTERVENTION Obtain post HBO capillary blood glucose (ensure 1 physician order is in chart). A. Notify HBO physician and await physician orders. 2 If result   is 70 mg/dl or below: B. If the result meets the hospital definition of a critical result, follow hospital policy. A. Give patient an 8 ounce Glucerna Shake®, an 8 ounce Ensure®, or 8 ounces of a Glucerna/Ensure equivalent dietary supplement*. B. Wait 15 minutes for symptoms of Mashaw, Jaquez [name] ([id number]) [id number] Page 6 of 12 If result is 71 mg/dl to 100 mg/dl: hypoglycemia (i.e. nervousness, anxiety, sweating, chills, clamminess, irritability, confusion, tachycardia or dizziness). C. If patient asymptomatic, discharge patient. If patient symptomatic, repeat capillary blood glucose (CBG) and notify HBO physician. If result is 101 mg/dl to 249 mg/dl: A. Discharge patient. A. Notify HBO physician and await physician orders. B. It is recommended to do blood/urine ketone If result is 250 mg/dl or greater: testing. C. If the result meets the hospital definition of a critical result, follow hospital  policy. *Juice or candies are NOT equivalent products. If patient refuses the Glucerna or Ensure, please consult the hospital dietitian for an appropriate substitute. Electronic Signature(s) Signed: 10/26/2022 12:07:49 PM By: Cannon, Jennifer MD FACS Signed: 10/26/2022 3:06:09 PM By: Zochol, Jamie RN Entered By: Zochol, Jamie on 10/26/2022 10:20:00 -------------------------------------------------------------------------------- Problem List Details Patient Name: Date of Service: [name], [name] 10/26/2022 9:30 A M Medical Record Number: [id number] Patient Account Number: [id number] Date of Birth/Sex: Treating RN: 07/07/1956 (66 y.o. 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, left ankle and foot 09/10/2022 No Yes E11.65 Type 2 diabetes mellitus with hyperglycemia 09/10/2022 No Yes E11.621 Type 2 diabetes mellitus with foot ulcer 09/10/2022 No Yes Inactive Problems Resolved Problems Electronic Signature(s) Signed: 10/26/2022 10:07:32 AM By: Cannon, Jennifer MD FACS Entered By: Cannon, Jennifer on 10/26/2022 10:07:32 [name], [name] ([id number]) [id number] Page 7 of 12 -------------------------------------------------------------------------------- Progress Note Details Patient Name: Date of Service: [name], [name] 10/26/2022 9:30 A M Medical Record Number: [id number] Patient Account Number: [id number] Date of Birth/Sex: Treating RN: 11/12/1956 (66 y.o. M) Primary Care Provider: Schultz, Douglas Other Clinician: Referring  Provider: Treating Provider/Extender: Cannon, Jennifer Schultz, Douglas Weeks in Treatment: 6 Subjective Chief Complaint Information obtained from Patient Patients presents for treatment of an open diabetic ulcer and evaluation for hyperbaric oxygen therapy History of Present Illness (HPI) ADMISSION 09/10/2022 This is a 66-year-old poorly controlled type II diabetic (last hemoglobin A1c 10.8%) who has had an ulcer on his heel for over 3 years. He has been seen in multiple wound care centers, including [facility] and [facility]. He reports that at least 3 doctors have recommended that he undergo below-knee amputation. He most recently met with Dr. Matt Goldman, a vascular surgeon affiliated with [facility]. 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 Unfortunately, the patient has some extenuating social circumstances including the fact that he cares for his wife who has stage IV colon cancer and still works, driving vehicles for [employer]. 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 He was recently hospitalized at [facility] for sepsis and currently has a PICC line through which he receives IV antibiotics. He reports having had another MRI during that hospital stay   along with a chest x-ray and EKG. He apparently contacted one of the hyperbaric therapy techs here and asked a number of questions about hyperbaric oxygen treatments. He subsequently self-referred to our center to undergo further evaluation and management. I mention to him that [facility] actually has hyperbaric chambers, but he states that he lives in [place] and this would be more convenient for him given the intensive nature of the therapy and time requirement. ABI in clinic today was 0.94. The patient actually has 2 wounds. There is a wound on the dorsum of his left foot with heavy black eschar and slough  present. After debridement, this was demonstrated to involve the muscle and the extensor tendons are exposed. On his heel, there is essentially a "shark bite" type wound, with much of the heel fat pad absent. The muscle layer is exposed. There is blue-green staining around the perimeter of the wound, but no significant odor. He says he has been applying collagen to the wound on his heel and Silvadene and Betadine to the wound on his dorsal foot. 09/20/2022: The heel wound is quite macerated with wet periwound callus. There is slough accumulation on the surface. The dorsal foot wound looks better this week. There is still exposed tendon, but it is fairly clean with just a little biofilm buildup. We are still working on gathering the required documentation to submit for pretreatment review for hyperbaric oxygen therapy. 09/29/2022: The dorsal foot wound continues to improve. I do not see any exposed tendon at this point. There is just some slough accumulation on the wound surface. He continues to have very wet macerated periwound callus on his heel. There is slough on the surface, but it is loose and thin. There is an area of undermining at the 11 o'clock position, but the overlying skin and subcutaneous tissue is healthy and viable. He has been approved for hyperbaric oxygen therapy and will start treatment tomorrow. 10/06/2022: Continued contraction and improvement of the dorsal foot wound. There is just a little bit of slough accumulation on the wound surface. The periwound callus continues to accumulate on the heel wound and it is quite macerated. It is also persistently found with blue-green staining present. He did initiate his hyperbaric oxygen therapy, but has had significant difficulty with decompression. He will be going to ENT to have PE tubes placed. 10/18/2022: His hyperbaric oxygen therapy is on hold while his otological issues are being addressed. He came in again today with the periwound  skin on both the dorsal aspect of his foot and his calcaneus completely macerated. The blue-green discoloration, however, has abated and the undermining on the calcaneus has improved. The dorsal foot wound has also contracted somewhat. 10/26/2022: The dorsal foot wound continues to contract and fill with good granulation tissue. The heel, once again, has a rim of macerated callus, but the undermining and tunneling continues to contract. He has completed his oral antibiotics and has been using the [facility] topical compounded antibiotic for his dressing changes at home. He is scheduled to see ENT this afternoon. Patient History Family History Cancer - Father,Mother, Diabetes - Mother,Father, Lung Disease - Father, Thyroid Problems - Mother, No family history of Heart Disease, Hereditary Spherocytosis, Hypertension, Kidney Disease, Seizures, Stroke, Tuberculosis. Social History Never smoker, Marital Status - Married, Alcohol Use - Never, Drug Use - No History, Caffeine Use - Daily. Medical History Eyes Patient has history of Cataracts Denies history of Glaucoma, Optic Neuritis Ear/Nose/Mouth/Throat Denies history of Chronic sinus problems/congestion, Middle ear  problems Respiratory Patient has history of Sleep Apnea Cardiovascular Patient has history of Hypertension Gastrointestinal Denies history of Cirrhosis , Colitis, Crohnoos, Hepatitis A, Hepatitis B, Hepatitis C Endocrine Patient has history of Type II Diabetes Immunological Denies history of Lupus Erythematosus, Raynaudoos, Scleroderma Musculoskeletal KULE, GASCOIGNE ([id number]) [id number] Page 8 of 12 Patient has history of Osteomyelitis - 2023 Neurologic Patient has history of Neuropathy - Bila lower extremities Oncologic Denies history of Received Chemotherapy, Received Radiation Psychiatric Denies history of Anorexia/bulimia, Confinement Anxiety Hospitalization/Surgery History - I and D Left  calcaneus. - back surgery- laminectomy. - eye surgery- Bila cataracts. - shoulder arthroscopy. Medical A Surgical History Notes nd Cardiovascular hyperlipidemia Genitourinary AKI Objective Constitutional No acute distress.. Vitals Time Taken: 9:47 AM, Height: 74 in, Weight: 245 lbs, BMI: 31.5, Temperature: 98.4 F, Pulse: 98 bpm, Respiratory Rate: 18 breaths/min, Blood Pressure: 135/78 mmHg, Capillary Blood Glucose: 201 mg/dl. Respiratory Normal work of breathing on room air.. General Notes: 10/26/2022: The dorsal foot wound continues to contract and fill with good granulation tissue. The heel, once again, has a rim of macerated callus, but the undermining and tunneling continues to contract. Integumentary (Hair, Skin) Wound #1 status is Open. Original cause of wound was Pressure Injury. The date acquired was: 03/09/2019. The wound has been in treatment 6 weeks. The wound is located on the Left Calcaneus. The wound measures 6.5cm length x 5cm width x 0.4cm depth; 25.525cm^2 area and 10.21cm^3 volume. There is Fat Layer (Subcutaneous Tissue) exposed. There is no tunneling noted, however, there is undermining starting at 10:00 and ending at 1:00 with a maximum distance of 1.4cm. There is a medium amount of serosanguineous drainage noted. The wound margin is fibrotic, thickened scar. There is medium (34-66%) red, pink granulation within the wound bed. There is a medium (34-66%) amount of necrotic tissue within the wound bed including Eschar and Adherent Slough. The periwound skin appearance exhibited: Callus, Maceration. The periwound skin appearance did not exhibit: Dry/Scaly. Wound #2 status is Open. Original cause of wound was Pressure Injury. The date acquired was: 07/09/2022. The wound has been in treatment 6 weeks. The wound is located on the Left,Dorsal Foot. The wound measures 3cm length x 2.2cm width x 0.1cm depth; 5.184cm^2 area and 0.518cm^3 volume. There is Fat Layer (Subcutaneous  Tissue) exposed. There is no tunneling or undermining noted. There is a medium amount of serosanguineous drainage noted. There is medium (34-66%) pink granulation within the wound bed. There is a medium (34-66%) amount of necrotic tissue within the wound bed including Eschar and Adherent Slough. The periwound skin appearance had no abnormalities noted for texture. The periwound skin appearance had no abnormalities noted for color. The periwound skin appearance exhibited: Maceration. Periwound temperature was noted as No Abnormality. 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 There was a Selective/Open Wound Skin/Epidermis Debridement with a total area of 32.5 sq cm performed by Fredirick Maudlin, MD. With the following instrument(s): Curette to remove Viable and Non-Viable tissue/material. Material removed includes Callus, Skin: Epidermis, and Biofilm. No specimens were taken. A time out was conducted at 09:58,  prior to the start of the procedure. A Minimum amount of bleeding was controlled with Pressure. The procedure was tolerated well with a pain level of 0 throughout and a pain level of 0 following the procedure. Post Debridement Measurements: 6.5cm length x 5cm width x 0.4cm depth; 10.21cm^3 volume. Character of Wound/Ulcer Post Debridement requires further debridement. Severity of Tissue Post Debridement is: Fat layer exposed. Post procedure Diagnosis Wound #1: Same as Pre-Procedure General Notes: Scribed for Dr. Cannon by  Jamie Zochol, RN. [name], Kaamil [name] ([id number]) [id number] Page 9 of 12 Wound #2 Pre-procedure diagnosis of Wound #2 is a Diabetic Wound/Ulcer of the Lower Extremity located on the Left,Dorsal Foot .Severity of Tissue Pre Debridement is: Fat layer exposed. There was a Excisional Skin/Subcutaneous Tissue Debridement with a total area of 6.6 sq cm performed by Cannon, Jennifer, MD. With the following instrument(s): Curette to remove Viable and Non-Viable tissue/material. Material removed includes Subcutaneous Tissue and Slough and. No specimens were taken. A time out was conducted at 09:58, prior to the start of the procedure. A Minimum amount of bleeding was controlled with Pressure. The procedure was tolerated well with a pain level of 0 throughout and a pain level of 0 following the procedure. Post Debridement Measurements: 3cm length x 2.2cm width x 0.1cm depth; 0.518cm^3 volume. Character of Wound/Ulcer Post Debridement requires further debridement. Severity of Tissue Post Debridement is: Fat layer exposed. Post procedure Diagnosis Wound #2: Same as Pre-Procedure General Notes: Scribed for Dr. Cannon by Jamie Zochol, RN. Plan Follow-up Appointments: Return Appointment in 1 week. - Dr. Cannon Rm Bathing/ Shower/ Hygiene: May shower with protection but do not get wound dressing(s) wet. Edema Control - Lymphedema / SCD / Other: Elevate legs to the level of the heart or above for 30 minutes daily and/or when sitting, a frequency of: Avoid standing for long periods of time. Hyperbaric Oxygen Therapy: Wound #1 Left Calcaneus: Evaluate for HBO Therapy Indication: - chronic refractory osteomyelitis If appropriate for treatment, begin HBOT per protocol: 2.5 ATA for 90 Minutes with 2 Five (5) Minute Air Breaks T Number of Treatments: - 40 otal One treatments per day (delivered Monday through Friday unless otherwise specified in Special Instructions below): Finger  stick Blood Glucose Pre- and Post- HBOT Treatment. Follow Hyperbaric Oxygen Glycemia Protocol Afrin (Oxymetazoline HCL) 0.05% nasal spray - 1 spray in both nostrils daily as needed prior to HBO treatment for difficulty clearing ears WOUND #1: - Calcaneus Wound Laterality: Left Cleanser: Soap and Water 1 x Per Day/30 Days Discharge Instructions: May shower and wash wound with dial antibacterial soap and water prior to dressing change. Peri-Wound Care: Sween Lotion (Moisturizing lotion) 1 x Per Day/30 Days Discharge Instructions: Apply moisturizing lotion as directed Topical: Gentamicin 1 x Per Day/30 Days Discharge Instructions: As directed by physician Prim Dressing: KerraCel Ag Gelling Fiber Dressing, 4x5 in (silver alginate) (Generic) 1 x Per Day/30 Days ary Discharge Instructions: Apply silver alginate to wound bed as instructed Secondary Dressing: ABD Pad, 5x9 (Generic) 1 x Per Day/30 Days Discharge Instructions: Apply over primary dressing as directed. Secondary Dressing: Zetuvit Plus Silicone Border Dressing 5x5 (in/in) 1 x Per Day/30 Days Discharge Instructions: Apply silicone border over primary dressing as directed. Secured With: 3M Medipore Soft Cloth Surgical T 2x10 (in/yd) 1 x Per Day/30 Days ape Discharge Instructions: Secure with tape as directed. Com pression Wrap: Kerlix Roll 4.5x3.1 (in/yd) (Generic) 1 x Per Day/30 Days Discharge Instructions: Apply Kerlix and Coban compression as directed. Com pression Wrap: Coban   Self-Adherent Wrap 4x5 (in/yd) (Generic) 1 x Per Day/30 Days Discharge Instructions: Apply over Kerlix as directed. WOUND #2: - Foot Wound Laterality: Dorsal, Left Cleanser: Soap and Water 1 x Per Day/30 Days Discharge Instructions: May shower and wash wound with dial antibacterial soap and water prior to dressing change. Peri-Wound Care: Sween Lotion (Moisturizing lotion) 1 x Per Day/30 Days Discharge Instructions: Apply moisturizing lotion as  directed Topical: Gentamicin 1 x Per Day/30 Days Discharge Instructions: As directed by physician Prim Dressing: KerraCel Ag Gelling Fiber Dressing, 4x5 in (silver alginate) (Generic) 1 x Per Day/30 Days ary Discharge Instructions: Apply silver alginate to wound bed as instructed Secondary Dressing: ALLEVYN Heel 4 1/2in x 5 1/2in / 10.5cm x 13.5cm (Generic) 1 x Per Day/30 Days Discharge Instructions: Apply over primary dressing as directed. Secondary Dressing: Zetuvit Plus Silicone Border Dressing 5x5 (in/in) (Generic) 1 x Per Day/30 Days Discharge Instructions: Apply silicone border over primary dressing as directed. Secured With: 55M Medipore Public affairs consultant Surgical T 2x10 (in/yd) 1 x Per Day/30 Days ape Discharge Instructions: Secure with tape as directed. Com pression Wrap: Kerlix Roll 4.5x3.1 (in/yd) (Generic) 1 x Per Day/30 Days Discharge Instructions: Apply Kerlix and Coban compression as directed. Com pression Wrap: Coban Self-Adherent Wrap 4x5 (in/yd) (Generic) 1 x Per Day/30 Days Discharge Instructions: Apply over Kerlix as directed. 10/26/2022: The dorsal foot wound continues to contract and fill with good granulation tissue. The heel, once again, has a rim of macerated callus, but the undermining and tunneling continues to contract. I used a curette to debride slough and nonviable subcutaneous tissue off the dorsal foot wound. I similarly debrided callus and biofilm from the heel wound. Hopefully, once he sees ENT he will be able to resume his hyperbaric oxygen therapy. We will continue to use his Keystone topical compounded antibiotic on , his wounds with silver alginate, Kerlix and Coban wrapping. Due to the upcoming holiday and the fact that he is able to care for his wounds at home, I will see him in clinic in 2 weeks. Electronic Signature(s) GADIEL, JOHN [name] ([id number]) [id number] Page 10 of 12 Signed: 10/26/2022 10:16:03 AM By: Fredirick Maudlin MD  FACS Entered By: Fredirick Maudlin on 10/26/2022 10:16:03 -------------------------------------------------------------------------------- HxROS Details Patient Name: Date of Service: [name], [name] 10/26/2022 9:30 A M Medical Record Number: [id number] Patient Account Number: [id number] Date of Birth/Sex: Treating RN: 1956/02/13 (66 y.o. M) Primary Care Provider: Nelda Bucks Other Clinician: Referring Provider: Treating Provider/Extender: Doristine Bosworth in Treatment: 6 Eyes Medical History: Positive for: Cataracts Negative for: Glaucoma; Optic Neuritis Ear/Nose/Mouth/Throat Medical History: Negative for: Chronic sinus problems/congestion; Middle ear problems Respiratory Medical History: Positive for: Sleep Apnea Cardiovascular Medical History: Positive for: Hypertension Past Medical History Notes: hyperlipidemia Gastrointestinal Medical History: Negative for: Cirrhosis ; Colitis; Crohns; Hepatitis A; Hepatitis B; Hepatitis C Endocrine Medical History: Positive for: Type II Diabetes Time with diabetes: 20 years Treated with: Insulin Blood sugar tested every day: Yes Tested : 2-3 Genitourinary Medical History: Past Medical History Notes: AKI Immunological Medical History: Negative for: Lupus Erythematosus; Raynauds; Scleroderma Musculoskeletal Medical History: Positive for: Osteomyelitis - 2023 Neurologic Medical History: Positive for: Neuropathy - Bila lower extremities RHYS, LICHTY ([id number]) [id number].pdf Page 11 of 12 Oncologic Medical History: Negative for: Received Chemotherapy; Received Radiation Psychiatric Medical History: Negative for: Anorexia/bulimia; Confinement Anxiety HBO Extended History Items Eyes: Cataracts Immunizations Pneumococcal Vaccine: Received Pneumococcal Vaccination: Yes Received Pneumococcal Vaccination On or After 60th Birthday: Yes Implantable  Devices Yes Hospitalization /  Surgery History Type of Hospitalization/Surgery I and D Left calcaneus back surgery- laminectomy eye surgery- Bila cataracts shoulder arthroscopy Family and Social History Cancer: Yes - Father,Mother; Diabetes: Yes - Mother,Father; Heart Disease: No; Hereditary Spherocytosis: No; Hypertension: No; Kidney Disease: No; Lung Disease: Yes - Father; Seizures: No; Stroke: No; Thyroid Problems: Yes - Mother; Tuberculosis: No; Never smoker; Marital Status - Married; Alcohol Use: Never; Drug Use: No History; Caffeine Use: Daily; Financial Concerns: No; Food, Clothing or Shelter Needs: No; Support System Lacking: No; Transportation Concerns: No Electronic Signature(s) Signed: 10/26/2022 12:07:49 PM By: Cannon, Jennifer MD FACS Entered By: Cannon, Jennifer on 10/26/2022 10:13:50 -------------------------------------------------------------------------------- SuperBill Details Patient Name: Date of Service: Mott, [name] 10/26/2022 Medical Record Number: [id number] Patient Account Number: [id number] Date of Birth/Sex: Treating RN: 03/10/1956 (66 y.o. 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, left ankle and foot E11.65 Type 2 diabetes mellitus with hyperglycemia E11.621 Type 2 diabetes mellitus with foot ulcer Facility Procedures : Cantin, [name] CPT4 Code: 36100012 ONALD [name] ([id number] Description: 11042 - DEB SUBQ TISSUE 20 SQ CM/< ICD-10 Diagnosis Description L97.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 ) [id number] Modifier:  [email or web address] Quantity: 1 27.pdf Page 12 of 12 : CPT4 Code: 76100126 9 I Description: 7597 - DEBRIDE WOUND 1ST 20 SQ CM OR < CD-10 Diagnosis Description L97.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 Modifier: 1 Quantity: : CPT4 Code: 76100127 9 I Description: 7598 - DEBRIDE WOUND EA ADDL 20 SQ CM CD-10 Diagnosis Description 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 Modifier: 1 Quantity: Physician Procedures : CPT4 Code Description Modifier [id number] 99214 - WC PHYS LEVEL 4 - EST PT 25 ICD-10 Diagnosis Description 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, 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 Quantity: 1 : [id number] 11042 - WC PHYS SUBQ TISS 20 SQ CM ICD-10 Diagnosis Description L97.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 Quantity: 1 : 6770143 97597 - WC PHYS DEBR WO ANESTH 20 SQ CM ICD-10 Diagnosis Description L97.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 Quantity: 1 : 6770150 97598 - WC PHYS DEBR WO ANESTH EA ADD 20 CM ICD-10 Diagnosis Description 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 

## 2022-10-27 ENCOUNTER — Encounter (HOSPITAL_BASED_OUTPATIENT_CLINIC_OR_DEPARTMENT_OTHER): Payer: Medicare Other | Admitting: General Surgery

## 2022-10-27 DIAGNOSIS — E11621 Type 2 diabetes mellitus with foot ulcer: Secondary | ICD-10-CM | POA: Diagnosis not present

## 2022-10-27 LAB — GLUCOSE, CAPILLARY
Glucose-Capillary: 195 mg/dL — ABNORMAL HIGH (ref 70–99)
Glucose-Capillary: 115 mg/dL — ABNORMAL HIGH (ref 70–99)

## 2022-10-27 NOTE — Progress Notes (Signed)
[name], [name] ([id number]) [id number] Page 1 of 2 Visit Report for 10/27/2022 Arrival Information Details Patient Name: Date of Service: [place], [name]. 10/27/2022 8:00 A M Medical Record Number: [id number] Patient Account Number: [id number] Date of Birth/Sex: Treating RN: [birth year] (66 y.o. [name] Primary Care Kadarious Dikes: Foye [name] Other Clinician: [name] Referring Yehuda Printup: Treating Jayen Bromwell/Extender: Kandis Cocking in Treatment: 6 Visit Information History Since Last Visit All ordered tests and consults were completed: Yes Patient Arrived: Ambulatory Added or deleted any medications: No Arrival Time: 07:44 Any new allergies or adverse reactions: No Accompanied By: None Had a fall or experienced change in No Transfer Assistance: None activities of daily living that may affect Patient Requires Transmission-Based Precautions: No risk of falls: Patient Has Alerts: No Signs or symptoms of abuse/neglect since last visito No Hospitalized since last visit: No Implantable device outside of the clinic excluding No cellular tissue based products placed in the center since last visit: Pain Present Now: No Electronic Signature(s) Signed: 10/27/2022 3:26:00 PM By: [name] EMT Entered By: [name] on 10/27/2022 15:26:00 -------------------------------------------------------------------------------- Encounter Discharge Information Details Patient Name: Date of Service: [name], [name]. 10/27/2022 8:00 A M Medical Record Number: [id number] Patient Account Number: [id number] Date of Birth/Sex: Treating RN: 08-Apr-1956 (66 y.o. [name] Primary Care Orlie Cundari: Foye [name] Other Clinician: [name] Referring Trentan Trippe: Treating Donie Moulton/Extender: Kandis Cocking in Treatment: 6 Encounter Discharge Information Items Discharge Condition:  Stable Ambulatory Status: Ambulatory Discharge Destination: Home Transportation: Private Auto Accompanied By: None Schedule Follow-up Appointment: Yes Clinical Summary of Care: Electronic Signature(s) Signed: 10/27/2022 3:36:19 PM By: [name] EMT Entered By: [name] on 10/27/2022 15:36:19 [name] ([id number]) [id number] Page 2 of 2 -------------------------------------------------------------------------------- Vitals Details Patient Name: Date of Service: [place], [name]. 10/27/2022 8:00 A M Medical Record Number: [id number] Patient Account Number: [id number] Date of Birth/Sex: Treating RN: 12-Apr-1956 (66 y.o. [name] Primary Care Ona Roehrs: Foye [name] Other Clinician: [name] Referring Reisha Wos: Treating Brandt Chaney/Extender: Kandis Cocking in Treatment: 6 Vital Signs Time Taken: 08:25 Temperature (F): 98.2 Height (in): 74 Pulse (bpm): 91 Weight (lbs): 245 Respiratory Rate (breaths/min): 16 Body Mass Index (BMI): 31.5 Blood Pressure (mmHg): 160/84 Capillary Blood Glucose (mg/dl): 712 Reference Range: 80 - 120 mg / dl Electronic Signature(s) Signed: 10/27/2022 3:26:29 PM By: [name] EMT Entered By: [name] on 10/27/2022 15:26:29

## 2022-10-28 ENCOUNTER — Encounter (HOSPITAL_BASED_OUTPATIENT_CLINIC_OR_DEPARTMENT_OTHER): Payer: Medicare Other | Admitting: General Surgery

## 2022-10-28 NOTE — Progress Notes (Signed)
[name], [name] ([id number]) [id number] Page 1 of 2 Visit Report for 10/27/2022 HBO Details Patient Name: Date of Service: [place], [name] 10/27/2022 8:00 A M Medical Record Number: [id number] Patient Account Number: [id number] Date of Birth/Sex: Treating RN: [birth year] (66 y.o. 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 HBO Treatment Details Treatment Number: 6 Patient Type: Outpatient Chamber Type: Monoplace Chamber Serial #: [id number] Treatment Protocol: 2.0 ATA with 90 minutes oxygen, with two 5 minute air breaks Treatment Details Compression Rate Down: 1.0 psi / minute De-Compression Rate Up: 1.0 psi / minute A breaks and breathing ir Compress Tx Pressure periods Decompress Decompress Begins Reached (leave unused spaces Begins Ends blank) Chamber Pressure (ATA 1 2 2 2 2 2  --2 1 ) Clock Time (24 hr) 08:32 09:04 09:34 09:39 10:09 10:14 - - 10:45 11:03 Treatment Length: 151 (minutes) Treatment Segments: 5 Vital Signs Capillary Blood Glucose Reference Range: 80 - 120 mg / dl HBO Diabetic Blood Glucose Intervention Range: <131 mg/dl or mg/dl Time Vitals Blood Respiratory Capillary Blood Glucose Pulse Action Type: Pulse: Temperature: Taken: Pressure: Rate: Glucose (mg/dl): Meter #: Oximetry (%) Taken: Pre 08:25 160/84 91 16 98.2 195 Post 11:09 165/84 97 16 98 115 Treatment Response Treatment Toleration: Well Treatment Completion Status: Treatment Completed without Adverse Event Treatment Notes The patient was seen by ENT yesterday and cleared to return to HBO for  treatment. The patient was seen by Dr. >102 today to check his ears before the treatment was started. Compression rate was set at 1 PSI/min. The patient had a problem clearing at 3 PSI. Travel stopped until he was able to clear then restarted at 1 PSI/min. At 5.5 PSI he again was having a problem, travel stopped until he was able to clear. Again at 11 PSI he had a problem clearing travel stopped until he was able to clear. No other problems noted during compression. The patient had no problems on decompression. Physician HBO Attestation: I certify that I supervised this HBO treatment in accordance with Medicare guidelines. A trained emergency response team is readily available per Yes hospital policies and procedures. Continue HBOT as ordered. Yes Electronic Signature(s) Signed: 10/27/2022 5:15:20 PM By: 10/29/2022 MD FACS Previous Signature: 10/27/2022 3:35:05 PM Version By: 10/29/2022 EMT Entered By: [name] on 10/27/2022 17:15:20 10/29/2022 [name] (Nona [id number] Page 2 of 2 -------------------------------------------------------------------------------- HBO Safety Checklist Details Patient Name: Date of Service: [name], [name] [name]. 10/27/2022 8:00 A M Medical Record Number: 10/29/2022 Patient Account Number: [id number] Date of Birth/Sex: Treating RN: June 03, 1956 (66 y.o. 79 Primary Care Jeb Schloemer: Dianna Limbo Other Clinician: Foye [name] Referring Muadh Creasy: Treating Tay Whitwell/Extender: [name] in Treatment: 6 HBO Safety Checklist Items Safety Checklist Consent Form Signed Patient voided / foley secured and emptied When did you last eato last night 2130 Last dose of injectable or oral agent 0515 Ostomy pouch emptied and vented if applicable NA All implantable devices assessed, documented and approved NA Intravenous access site secured and place NA Valuables secured Linens  and cotton and cotton/polyester blend (less than 51% polyester) Personal oil-based products / skin lotions / body lotions removed Wigs or hairpieces removed NA Smoking or tobacco materials removed Books /  newspapers / magazines / loose paper removed Cologne, aftershave, perfume and deodorant removed Jewelry removed (may wrap wedding band) Make-up removed NA Hair care products removed Battery operated devices (external) removed Heating patches and chemical warmers removed Titanium eyewear removed NA Nail polish cured greater than 10 hours NA Casting material cured greater than 10 hours NA Hearing aids removed NA Loose dentures or partials removed removed by patient Prosthetics have been removed NA Patient demonstrates correct use of air break device (if applicable) Patient concerns have been addressed Patient grounding bracelet on and cord attached to chamber Specifics for Inpatients (complete in addition to above) Medication sheet sent with patient NA Intravenous medications needed or due during therapy sent with patient NA Drainage tubes (e.g. nasogastric tube or chest tube secured and vented) NA Endotracheal or Tracheotomy tube secured NA Cuff deflated of air and inflated with saline NA Airway suctioned NA Notes The safety check was done before the treatment was started. Electronic Signature(s) Signed: 10/27/2022 3:27:35 PM By: Valeria Batman EMT Entered By: Valeria Batman on 10/27/2022 15:27:34

## 2022-10-28 NOTE — Progress Notes (Signed)
[name], [name] ([id number]) [id number] Page 1 of 2 Visit Report for 10/27/2022 Problem List Details Patient Name: Date of Service: [name], [name] 10/27/2022 8:00 A M Medical Record Number: [id number] Patient Account Number: [id number] Date of Birth/Sex: Treating RN: Apr 27, 1956 (66 y.o. 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 10/27/2022 Medical Record Number: [id number] Patient Account Number: [id number] Date of Birth/Sex: Treating RN: [birth year] (66 y.o. 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, left ankle and foot E11.65 Type 2 diabetes mellitus with hyperglycemia E11.621 Type 2 diabetes mellitus with foot ulcer Facility Procedures : CPT4 Code Description: [id number] G0277-(Facility Use Only) HBOT full body chamber, , ICD-10 Diagnosis Description [phone] Other chronic osteomyelitis, left ankle and foot L97.424 Non-pressure chronic ulcer of left heel and midfoot with n L97.523  Non-pressure chronic ulcer of other part of left foot with E11.621 Type 2 diabetes mellitus with foot ulcer Modifier: ecrosis of bo necrosis of Quantity: 5 ne muscle Physician Procedures : CPT4 Code Description Modifier [id number] - WC PHYS HYPERBARIC OXYGEN THERAPY ICD-10 Diagnosis Description 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

## 2022-10-29 ENCOUNTER — Encounter (HOSPITAL_BASED_OUTPATIENT_CLINIC_OR_DEPARTMENT_OTHER): Payer: Medicare Other | Admitting: Internal Medicine

## 2022-10-29 DIAGNOSIS — E11621 Type 2 diabetes mellitus with foot ulcer: Secondary | ICD-10-CM | POA: Diagnosis not present

## 2022-10-29 DIAGNOSIS — L97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: Secondary | ICD-10-CM | POA: Diagnosis not present

## 2022-10-29 DIAGNOSIS — L97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: Secondary | ICD-10-CM | POA: Diagnosis not present

## 2022-10-29 DIAGNOSIS — M86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, left ankle and foot: Secondary | ICD-10-CM

## 2022-10-29 LAB — GLUCOSE, CAPILLARY
Glucose-Capillary: 199 mg/dL — ABNORMAL HIGH (ref 70–99)
Glucose-Capillary: 150 mg/dL — ABNORMAL HIGH (ref 70–99)

## 2022-10-29 NOTE — Progress Notes (Signed)
[name], [name] ([id number]) [id number] Page 1 of 2 Visit Report for 10/29/2022 HBO Details Patient Name: Date of Service: [name], [name]. 10/29/2022 8:00 A M Medical Record Number: [id number] Patient Account Number: [id number] Date of Birth/Sex: Treating RN: 06-Dec-1956 (66 y.o. 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 HBO Treatment Details Treatment Number: 7 Patient Type: Outpatient Chamber Type: Monoplace Chamber Serial #: [id number] Treatment Protocol: 2.0 ATA with 90 minutes oxygen, with two 5 minute air breaks Treatment Details Compression Rate Down: 1.0 psi / minute De-Compression Rate Up: 1.0 psi / minute A breaks and breathing ir Compress Tx Pressure periods Decompress Decompress Begins Reached (leave unused spaces Begins Ends blank) Chamber Pressure (ATA 1 2 2 2 2 2  --2 1 ) Clock Time (24 hr) 08:38 08:59 09:29 09:34 10:04 10:09 - - 10:39 10:58 Treatment Length: 140 (minutes) Treatment Segments: 5 Vital Signs Capillary Blood Glucose Reference Range: 80 - 120 mg / dl HBO Diabetic Blood Glucose Intervention Range: <131 mg/dl or >249 mg/dl Type: Time Vitals Blood Respiratory Capillary Blood Glucose Pulse Action Pulse: Temperature: Taken: Pressure: Rate: Glucose (mg/dl): Meter #: Oximetry (%) Taken: Pre 08:12 150/88 90 18 98.7 199 1 none per protocol Post 11:06 161/80 75 18 98 150 1 none per protocol Treatment Response Treatment Toleration: Well Treatment Completion Status: Treatment Completed without Adverse Event Physician HBO Attestation: I certify that I  supervised this HBO treatment in accordance with Medicare guidelines. A trained emergency response team is readily available per Yes hospital policies and procedures. Continue HBOT as ordered. Yes Electronic Signature(s) Signed: 11/01/2022 1:38:49 PM By: Kalman Shan DO Previous Signature: 10/29/2022 12:05:53 PM Version By: Donavan Burnet CHT EMT BS , , Previous Signature: 11/01/2022 11:34:26 AM Version By: Kalman Shan DO Entered By: Kalman Shan on 11/01/2022 13:36:31 [name] ([id number] Page 2 of 2 -------------------------------------------------------------------------------- HBO Safety Checklist Details Patient Name: Date of Service: [place], [name]. 10/29/2022 8:00 A M Medical Record Number: [id number] Patient Account Number: [id number] Date of Birth/Sex: Treating RN: Dec 08, 1956 (66 y.o. [name] Primary Care Rhyen Mazariego: Nelda Bucks Other Clinician: Valeria Batman Referring Ferrell Flam: Treating Jathan Balling/Extender: Colin Benton in Treatment: 7 HBO Safety Checklist Items Safety Checklist Consent Form Signed Patient voided / foley secured and emptied When did you last eato 0600 Last dose of injectable or oral agent 0615 Ostomy pouch emptied and vented if applicable NA All implantable devices assessed, documented and approved NA Intravenous access site secured and place NA Valuables secured Linens and cotton and cotton/polyester blend (less than 51% polyester) Personal oil-based products / skin lotions / body lotions removed Wigs or hairpieces removed NA Smoking or tobacco materials removed NA Books / newspapers / magazines / loose paper removed Cologne, aftershave, perfume and deodorant removed Jewelry removed (may wrap wedding band) Make-up removed NA Hair care products removed Battery operated devices (external) removed Heating patches and chemical warmers  removed Titanium eyewear removed Nail polish cured greater than 10 hours NA Casting material cured greater than 10 hours NA Hearing aids removed NA Loose dentures or partials removed NA Prosthetics have been removed NA Patient demonstrates correct use of air break device (if applicable) Patient concerns have been addressed Patient grounding bracelet  on and cord attached to chamber Specifics for Inpatients (complete in addition to above) Medication sheet sent with patient NA Intravenous medications needed or due during therapy sent with patient NA Drainage tubes (e.g. nasogastric tube or chest tube secured and vented) NA Endotracheal or Tracheotomy tube secured NA Cuff deflated of air and inflated with saline NA Airway suctioned NA Notes paper version used prior to treatment start. Electronic Signature(s) Signed: 10/29/2022 11:59:39 AM By: Haywood Pao CHT EMT BS , , Entered By: Haywood Pao on 10/29/2022 11:59:39

## 2022-10-29 NOTE — Progress Notes (Signed)
[name], [name] ([id number]) [id number] Page 1 of 2 Visit Report for 10/29/2022 Arrival Information Details Patient Name: Date of Service: [name]. 10/29/2022 8:00 A M Medical Record Number: [id number] Patient Account Number: [id number] Date of Birth/Sex: Treating RN: 1956/02/07 (66 y.o. [name] Primary Care Elverta Dimiceli: Foye [name] Other Clinician: Haywood Pao Referring Marwan Lipe: Treating Jenna Ardoin/Extender: [name] in Treatment: 7 Visit Information History Since Last Visit All ordered tests and consults were completed: Yes Patient Arrived: Ambulatory Added or deleted any medications: No Arrival Time: 08:05 Any new allergies or adverse reactions: No Accompanied By: self Had a fall or experienced change in No Transfer Assistance: None activities of daily living that may affect Patient Identification Verified: Yes risk of falls: Secondary Verification Process Completed: Yes Signs or symptoms of abuse/neglect since last visito No Patient Requires Transmission-Based Precautions: No Hospitalized since last visit: No Patient Has Alerts: No Implantable device outside of the clinic excluding No cellular tissue based products placed in the center since last visit: Pain Present Now: No Electronic Signature(s) Signed: 10/29/2022 11:57:50 AM By: Haywood Pao CHT EMT BS , , Entered By: Haywood Pao on 10/29/2022 11:57:49 -------------------------------------------------------------------------------- Encounter Discharge Information Details Patient Name: Date of Service: [name]. 10/29/2022 8:00 A M Medical Record Number: [id number] Patient Account Number: [id number] Date of Birth/Sex: Treating RN: 06-23-1956 (66 y.o. [name] Primary Care Vicci Reder: Foye [name] Other Clinician: Haywood Pao Referring Domingos Riggi: Treating Rashanna Christiana/Extender: [name] in Treatment: 7 Encounter Discharge Information Items Discharge Condition: Stable Ambulatory Status: Ambulatory Discharge Destination: Home Transportation: Private Auto Accompanied By: self Schedule Follow-up Appointment: No Clinical Summary of Care: Electronic Signature(s) Signed: 10/29/2022 12:07:40 PM By: Haywood Pao CHT EMT BS , , Entered By: Haywood Pao on 10/29/2022 12:07:40 [name] ([id number]) [id number] Page 2 of 2 -------------------------------------------------------------------------------- Vitals Details Patient Name: Date of Service: [name]. 10/29/2022 8:00 A M Medical Record Number: [id number] Patient Account Number: [id number] Date of Birth/Sex: Treating RN: [birth year] (66 y.o. [name] Primary Care Lamoine Magallon: Foye [name] Other Clinician: Haywood Pao Referring Ronnald Shedden: Treating Amillion Scobee/Extender: [name] in Treatment: 7 Vital Signs Time Taken: 08:12 Temperature (F): 98.7 Height (in): 74 Pulse (bpm): 90 Weight (lbs): 245 Respiratory Rate (breaths/min): 18 Body Mass Index (BMI): 31.5 Blood Pressure (mmHg): 150/88 Capillary Blood Glucose (mg/dl): 786 Reference Range: 80 - 120 mg / dl Electronic Signature(s) Signed: 10/29/2022 11:58:17 AM By: Haywood Pao CHT EMT BS , , Entered By: Haywood Pao on 10/29/2022 11:58:17

## 2022-11-01 ENCOUNTER — Encounter (HOSPITAL_BASED_OUTPATIENT_CLINIC_OR_DEPARTMENT_OTHER): Payer: Medicare Other | Admitting: Internal Medicine

## 2022-11-01 DIAGNOSIS — L97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: Secondary | ICD-10-CM

## 2022-11-01 DIAGNOSIS — M86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, left ankle and foot: Secondary | ICD-10-CM

## 2022-11-01 DIAGNOSIS — L97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: Secondary | ICD-10-CM | POA: Diagnosis not present

## 2022-11-01 DIAGNOSIS — E11621 Type 2 diabetes mellitus with foot ulcer: Secondary | ICD-10-CM | POA: Diagnosis not present

## 2022-11-01 LAB — GLUCOSE, CAPILLARY
Glucose-Capillary: 148 mg/dL — ABNORMAL HIGH (ref 70–99)
Glucose-Capillary: 135 mg/dL — ABNORMAL HIGH (ref 70–99)

## 2022-11-01 NOTE — Progress Notes (Signed)
KENTRELL, HALLAHAN R ([id number]) [id number] Page 1 of 1 Visit Report for 10/29/2022 SuperBill Details Patient Name: Date of Service: [place], [name] 10/29/2022 Medical Record Number: [id number] Patient Account Number: [id number] Date of Birth/Sex: Treating RN: March 23, 1956 (66 y.o. 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, left ankle and foot E11.65 Type 2 diabetes mellitus with hyperglycemia E11.621 Type 2 diabetes mellitus with foot ulcer Facility Procedures CPT4 Code Description Modifier Quantity [id number] G0277-(Facility Use Only) HBOT full body chamber, , 5 ICD-10 Diagnosis Description [phone] Other chronic osteomyelitis, 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 Physician Procedures Quantity CPT4 Code Description Modifier [id number] 99183 - WC PHYS HYPERBARIC OXYGEN THERAPY 1 ICD-10 Diagnosis Description 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

## 2022-11-01 NOTE — Progress Notes (Signed)
ERICO, STAN R ([id number]) [id number] Page 1 of 2 Visit Report for 11/01/2022 Arrival Information Details Patient Name: Date of Service: [place], [name] 11/01/2022 8:00 A M Medical Record Number: [id number] Patient Account Number: [id number] Date of Birth/Sex: Treating RN: [birth year] (66 y.o. [name] Primary Care Keona Bilyeu: Foye [name] Other Clinician: [name] Referring Leandre Wien: Treating Seaira Byus/Extender: [name] in Treatment: 7 Visit Information History Since Last Visit All ordered tests and consults were completed: Yes Patient Arrived: Knee Scooter Added or deleted any medications: No Arrival Time: 07:52 Any new allergies or adverse reactions: No Accompanied By: None Had a fall or experienced change in No Transfer Assistance: None activities of daily living that may affect Patient Requires Transmission-Based Precautions: No risk of falls: Patient Has Alerts: No Signs or symptoms of abuse/neglect since last visito No Hospitalized since last visit: No Implantable device outside of the clinic excluding No cellular tissue based products placed in the center since last visit: Pain Present Now: No Electronic Signature(s) Signed: 11/01/2022 2:09:01 PM By: [name] EMT Entered By: [name] on 11/01/2022 14:09:01 -------------------------------------------------------------------------------- Encounter Discharge Information Details Patient Name: Date of Service: [name], [name] 11/01/2022 8:00 A M Medical Record Number: [id number] Patient Account Number: [id number] Date of Birth/Sex: Treating RN: 1956/08/25 (66 y.o. [name] Primary Care Khristian Seals: Foye [name] Other Clinician: [name] Referring Carmello Cabiness: Treating Teo Moede/Extender: [name] in Treatment: 7 Encounter Discharge Information Items Discharge Condition:  Stable Ambulatory Status: Knee Scooter Discharge Destination: Home Transportation: Private Auto Accompanied By: None Schedule Follow-up Appointment: Yes Clinical Summary of Care: Electronic Signature(s) Signed: 11/01/2022 2:36:32 PM By: [name] EMT Entered By: [name] on 11/01/2022 14:36:31 Nona Dell R ([id number]) [id number] Page 2 of 2 -------------------------------------------------------------------------------- Vitals Details Patient Name: Date of Service: [place], [name] 11/01/2022 8:00 A M Medical Record Number: [id number] Patient Account Number: [id number] Date of Birth/Sex: Treating RN: 1956/01/01 (66 y.o. [name] Primary Care Zaakirah Kistner: Foye [name] Other Clinician: [name] Referring Orson Rho: Treating Reigan Tolliver/Extender: [name] in Treatment: 7 Vital Signs Time Taken: 08:21 Temperature (F): 98.6 Height (in): 74 Pulse (bpm): 82 Weight (lbs): 245 Respiratory Rate (breaths/min): 16 Body Mass Index (BMI): 31.5 Blood Pressure (mmHg): 153/79 Capillary Blood Glucose (mg/dl): 244 Reference Range: 80 - 120 mg / dl Electronic Signature(s) Signed: 11/01/2022 2:13:11 PM By: [name] EMT Entered By: [name] on 11/01/2022 14:13:11

## 2022-11-01 NOTE — Progress Notes (Signed)
EULISES, KIJOWSKI R ([id number]) [id number] Page 1 of 2 Visit Report for 11/01/2022 HBO Details Patient Name: Date of Service: [place], [name] 11/01/2022 8:00 A M Medical Record Number: [id number] Patient Account Number: [id number] Date of Birth/Sex: Treating RN: June 09, 1956 (66 y.o. 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 HBO Treatment Details Treatment Number: 8 Patient Type: Outpatient Chamber Type: Monoplace Chamber Serial #: [id number] Treatment Protocol: 2.0 ATA with 90 minutes oxygen, with two 5 minute air breaks Treatment Details Compression Rate Down: 1.5 psi / minute De-Compression Rate Up: 1.5 psi / minute A breaks and breathing ir Compress Tx Pressure periods Decompress Decompress Begins Reached (leave unused spaces Begins Ends blank) Chamber Pressure (ATA 1 2 2 2 2 2  --2 1 ) Clock Time (24 hr) 08:29 08:54 09:24 09:29 10:00 10:05 - - 10:35 10:50 Treatment Length: 141 (minutes) Treatment Segments: 5 Vital Signs Capillary Blood Glucose Reference Range: 80 - 120 mg / dl HBO Diabetic Blood Glucose Intervention Range: <131 mg/dl or mg/dl Time Vitals Blood Respiratory Capillary Blood Glucose Pulse Action Type: Pulse: Temperature: Taken: Pressure: Rate: Glucose (mg/dl): Meter #: Oximetry (%) Taken: Pre 08:21 153/79 82 16 98.6 148 Post 10:55 160/86 70 16 98.1 135 Treatment Response Treatment Toleration: Well Treatment Completion Status: Treatment Completed without Adverse Event Treatment Notes The patient had problem clearing his ears at 7.0 PSI. Travel was stopped until  he was able to clear. Once the patient was able to clear travel was continued. Physician HBO Attestation: I certify that I supervised this HBO treatment in accordance with Medicare guidelines. A trained emergency response team is readily available per Yes hospital policies and procedures. Continue HBOT as ordered. Yes Electronic Signature(s) Signed: 11/02/2022 12:46:27 PM By: 11/04/2022 DO Previous Signature: 11/01/2022 2:30:55 PM Version By: 11/03/2022 EMT Entered By: [name] on 11/01/2022 15:49:04 11/03/2022 R (Nona [id number] Page 2 of 2 -------------------------------------------------------------------------------- HBO Safety Checklist Details Patient Name: Date of Service: [place], [name] LD R. 11/01/2022 8:00 A M Medical Record Number: 11/03/2022 Patient Account Number: [id number] Date of Birth/Sex: Treating RN: [birth year] (66 y.o. 79 Primary Care Athalene Kolle: [name] Other Clinician: Foye [name] Referring Simra Fiebig: Treating Tiara Bartoli/Extender: [name] in Treatment: 7 HBO Safety Checklist Items Safety Checklist Consent Form Signed Patient voided / foley secured and emptied When did you last eato 0600 Last dose of injectable or oral agent 0430 Ostomy pouch emptied and vented if applicable NA All implantable devices assessed, documented and approved Peg tube Intravenous access site secured and place NA Valuables secured Linens and cotton and cotton/polyester blend (less than 51% polyester) Personal oil-based products / skin lotions / body lotions removed Wigs or hairpieces removed NA Smoking or tobacco materials removed Books / newspapers / magazines / loose paper removed Cologne, aftershave, perfume and deodorant removed Jewelry removed (may wrap wedding band) Make-up removed NA Hair care products removed Battery operated devices (external) removed Heating patches  and chemical warmers removed Titanium eyewear removed NA Nail polish cured greater than 10 hours NA Casting material cured greater than 10 hours NA Hearing aids removed NA Loose dentures or partials removed removed by patient Prosthetics have been removed NA Patient demonstrates correct use of air  break device (if applicable) Patient concerns have been addressed Patient grounding bracelet on and cord attached to chamber Specifics for Inpatients (complete in addition to above) Medication sheet sent with patient NA Intravenous medications needed or due during therapy sent with patient NA Drainage tubes (e.g. nasogastric tube or chest tube secured and vented) NA Endotracheal or Tracheotomy tube secured NA Cuff deflated of air and inflated with saline NA Airway suctioned NA Notes The safety check was done before the treatment was started. Electronic Signature(s) Signed: 11/01/2022 2:15:16 PM By: Valeria Batman EMT Entered By: Valeria Batman on 11/01/2022 14:15:15

## 2022-11-02 ENCOUNTER — Encounter (HOSPITAL_BASED_OUTPATIENT_CLINIC_OR_DEPARTMENT_OTHER): Payer: Medicare Other | Admitting: Internal Medicine

## 2022-11-02 DIAGNOSIS — E11621 Type 2 diabetes mellitus with foot ulcer: Secondary | ICD-10-CM | POA: Diagnosis not present

## 2022-11-02 DIAGNOSIS — M86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, left ankle and foot: Secondary | ICD-10-CM | POA: Diagnosis not present

## 2022-11-02 DIAGNOSIS — L97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: Secondary | ICD-10-CM

## 2022-11-02 DIAGNOSIS — L97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: Secondary | ICD-10-CM

## 2022-11-02 LAB — GLUCOSE, CAPILLARY
Glucose-Capillary: 171 mg/dL — ABNORMAL HIGH (ref 70–99)
Glucose-Capillary: 175 mg/dL — ABNORMAL HIGH (ref 70–99)
Glucose-Capillary: 147 mg/dL — ABNORMAL HIGH (ref 70–99)

## 2022-11-02 NOTE — Progress Notes (Signed)
ALGIS, LEHENBAUER R ([id number]) [id number] Page 1 of 2 Visit Report for 11/02/2022 Arrival Information Details Patient Name: Date of Service: [place], [name] 11/02/2022 8:00 A M Medical Record Number: [id number] Patient Account Number: [id number] Date of Birth/Sex: Treating RN: December 05, 1956 (66 y.o. [name], Lauren Primary Care Jermain Curt: Foye [name] Other Clinician: [name] Referring Danyeal Akens: Treating Carlesha Seiple/Extender: [name] in Treatment: 7 Visit Information History Since Last Visit All ordered tests and consults were completed: Yes Patient Arrived: Ambulatory Added or deleted any medications: No Arrival Time: 08:29 Any new allergies or adverse reactions: No Accompanied By: None Had a fall or experienced change in No Transfer Assistance: None activities of daily living that may affect Patient Identification Verified: Yes risk of falls: Secondary Verification Process Completed: Yes Signs or symptoms of abuse/neglect since last visito No Patient Requires Transmission-Based Precautions: No Implantable device outside of the clinic excluding No Patient Has Alerts: No cellular tissue based products placed in the center since last visit: Pain Present Now: No Electronic Signature(s) Signed: 11/02/2022 3:23:06 PM By: [name] EMT Entered By: [name] on 11/02/2022 15:23:06 -------------------------------------------------------------------------------- Encounter Discharge Information Details Patient Name: Date of Service: [name], [name] 11/02/2022 8:00 A M Medical Record Number: [id number] Patient Account Number: [id number] Date of Birth/Sex: Treating RN: 07-Jul-1956 (66 y.o. [name], Lauren Primary Care Tamyra Fojtik: Foye [name] Other Clinician: [name] Referring Kristine Chahal: Treating Seleni Meller/Extender: [name] in Treatment: 7 Encounter Discharge  Information Items Discharge Condition: Stable Ambulatory Status: Ambulatory Discharge Destination: Home Transportation: Private Auto Accompanied By: None Schedule Follow-up Appointment: Yes Clinical Summary of Care: Electronic Signature(s) Signed: 11/02/2022 3:41:15 PM By: [name] EMT Entered By: [name] on 11/02/2022 15:41:15 Nona Dell R ([id number]) [id number] Page 2 of 2 -------------------------------------------------------------------------------- Vitals Details Patient Name: Date of Service: [place], [name] 11/02/2022 8:00 A M Medical Record Number: [id number] Patient Account Number: [id number] Date of Birth/Sex: Treating RN: June 28, 1956 (66 y.o. [name], Lauren Primary Care Jalynne Persico: Foye [name] Other Clinician: [name] Referring Latajah Thuman: Treating Lindsie Simar/Extender: [name] in Treatment: 7 Vital Signs Time Taken: 08:48 Temperature (F): 98.8 Height (in): 74 Pulse (bpm): 90 Weight (lbs): 245 Respiratory Rate (breaths/min): 16 Body Mass Index (BMI): 31.5 Blood Pressure (mmHg): 127/88 Capillary Blood Glucose (mg/dl): 361 Reference Range: 80 - 120 mg / dl Electronic Signature(s) Signed: 11/02/2022 3:23:36 PM By: [name] EMT Entered By: [name] on 11/02/2022 15:23:36

## 2022-11-02 NOTE — Progress Notes (Signed)
HAZEM, KENNER ([id number]) [id number] Page 1 of 2 Visit Report for 11/02/2022 Problem List Details Patient Name: Date of Service: [name], [name] 11/02/2022 8:00 A M Medical Record Number: [id number] Patient Account Number: [id number] Date of Birth/Sex: Treating RN: Nov 11, 1956 (66 y.o. 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 11/02/2022 Medical Record Number: [id number] Patient Account Number: [id number] Date of Birth/Sex: Treating RN: [birth year] (66 y.o. 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, left ankle and foot E11.65 Type 2 diabetes mellitus with hyperglycemia E11.621 Type 2 diabetes mellitus with foot ulcer Facility Procedures : CPT4 Code Description: [id number] G0277-(Facility Use Only) HBOT full body chamber, , ICD-10 Diagnosis Description [phone] Other chronic osteomyelitis, left ankle and foot L97.424 Non-pressure chronic ulcer of left heel and midfoot with n L97.523  Non-pressure chronic ulcer of other part of left foot with E11.621 Type 2 diabetes mellitus with foot ulcer Modifier: ecrosis of bo necrosis of Quantity: 5 ne muscle Physician Procedures : CPT4 Code Description Modifier [id number] - WC PHYS HYPERBARIC OXYGEN THERAPY ICD-10 Diagnosis Description 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

## 2022-11-02 NOTE — Progress Notes (Addendum)
[name], [name] [name] ([id number]) [id number] Page 1 of 2 Visit Report for 11/02/2022 HBO Details Patient Name: Date of Service: [name], [name]. 11/02/2022 8:00 A M Medical Record Number: [id number] Patient Account Number: [id number] Date of Birth/Sex: Treating RN: [birth year] (66 y.o. 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 HBO Treatment Details Treatment Number: 9 Patient Type: Outpatient Chamber Type: Monoplace Chamber Serial #: [id number] Treatment Protocol: 2.0 ATA with 90 minutes oxygen, with two 5 minute air breaks Treatment Details Compression Rate Down: 1.0 psi / minute De-Compression Rate Up: 1.0 psi / minute A breaks and breathing ir Compress Tx Pressure periods Decompress Decompress Begins Reached (leave unused spaces Begins Ends blank) Chamber Pressure (ATA 1 2 2 2 2 2  --2 1 ) Clock Time (24 hr) 09:33 10:00 10:30 10:35 11:05 11:10 - - 11:40 12:04 Treatment Length: 151 (minutes) Treatment Segments: 5 Vital Signs Capillary Blood Glucose Reference Range: 80 - 120 mg / dl HBO Diabetic Blood Glucose Intervention Range: <131 mg/dl or mg/dl Time Vitals Blood Respiratory Capillary Blood Glucose Pulse Action Type: Pulse: Temperature: Taken: Pressure: Rate: Glucose (mg/dl): Meter #: Oximetry (%) Taken: Pre 08:48 127/88 90 16 98.8 184 Post 12:09 161/81 77 16 97.9 147 Pre 09:29 171 Treatment Response Treatment Toleration: Well Treatment Completion Status: Treatment Completed without Adverse Event Treatment Notes The patient stated that his home blood sugar at 0530 was  360 and that at 0720 he took 10 units of insulin before coming in for his treatment. I spoke with Dr. >086 about this. Dr. Lady Gary asked that I give him 8 oz Glucerna and recheck his blood sugar. Compression travel set at 1.0 PSI/min due to the patient having problems clearing his ears. Physician HBO Attestation: I certify that I supervised this HBO treatment in accordance with Medicare guidelines. A trained emergency response team is readily available per Yes hospital policies and procedures. Continue HBOT as ordered. Yes Electronic Signature(s) Signed: 11/02/2022 3:49:02 PM By: 11/04/2022 [name] Previous Signature: 11/02/2022 3:40:00 PM Version By: 11/04/2022 EMT Previous Signature: 11/02/2022 3:46:12 PM Version By: 11/04/2022 [name] Entered By: [name] on 11/02/2022 15:47:22 [name] ([id number] Page 2 of 2 -------------------------------------------------------------------------------- HBO Safety Checklist Details Patient Name: Date of Service: [name], [name] [name] [name]. 11/02/2022 8:00 A M Medical Record Number: 11/04/2022 Patient Account Number: [id number] Date of Birth/Sex: Treating RN: December 03, 1956 (66 y.o. 78, [name] Primary Care [name]: [name] Other Clinician: Foye [name] Referring [name]: Treating [name]/Extender: [name] in Treatment: 7 HBO Safety Checklist Items Safety Checklist Consent Form Signed Patient voided / foley secured and emptied When did you last eato 0630 Last dose of injectable or oral agent 0720 Ostomy pouch emptied and vented if applicable NA All implantable devices assessed, documented and approved NA Intravenous access site secured and place NA Valuables secured Linens and cotton and cotton/polyester blend (less than 51% polyester) Personal oil-based products / skin lotions / body lotions removed Wigs or hairpieces removed NA Smoking or  tobacco materials removed Books / newspapers / magazines / loose paper removed Cologne, aftershave, perfume and deodorant removed Jewelry removed (may wrap wedding band) Make-up removed NA Hair care products removed Battery operated devices (external) removed Heating  patches and chemical warmers removed Titanium eyewear removed NA Nail polish cured greater than 10 hours NA Casting material cured greater than 10 hours NA Hearing aids removed NA Loose dentures or partials removed removed by patient Prosthetics have been removed NA Patient demonstrates correct use of air break device (if applicable) Patient concerns have been addressed Patient grounding bracelet on and cord attached to chamber Specifics for Inpatients (complete in addition to above) Medication sheet sent with patient NA Intravenous medications needed or due during therapy sent with patient NA Drainage tubes (e.g. nasogastric tube or chest tube secured and vented) NA Endotracheal or Tracheotomy tube secured NA Cuff deflated of air and inflated with saline NA Airway suctioned NA Notes The safety check was done before the treatment was started. Electronic Signature(s) Signed: 11/02/2022 3:26:57 PM By: [name] EMT Entered By: [name] on 11/02/2022 15:26:57

## 2022-11-02 NOTE — Progress Notes (Signed)
AMONI, MORALES ([id number]) [id number] Page 1 of 2 Visit Report for 11/01/2022 Problem List Details Patient Name: Date of Service: [name], [name] [name] 11/01/2022 8:00 A M Medical Record Number: [id number] Patient Account Number: [id number] Date of Birth/Sex: Treating RN: 1956-12-08 (66 y.o. 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 11/01/2022 Medical Record Number: [id number] Patient Account Number: [id number] Date of Birth/Sex: Treating RN: 07-Nov-1956 (66 y.o. 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, left ankle and foot E11.65 Type 2 diabetes mellitus with hyperglycemia E11.621 Type 2 diabetes mellitus with foot ulcer Facility Procedures : CPT4 Code Description: [id number] G0277-(Facility Use Only) HBOT full body chamber, , ICD-10 Diagnosis Description [phone] Other chronic osteomyelitis, left ankle and foot L97.424 Non-pressure chronic ulcer of left heel and midfoot with n L97.523  Non-pressure chronic ulcer of other part of left foot with Modifier: ecrosis of bo necrosis of Quantity: 5 ne muscle Physician Procedures : CPT4 Code Description Modifier [id number] - WC PHYS HYPERBARIC OXYGEN THERAPY ICD-10 Diagnosis Description 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

## 2022-11-03 ENCOUNTER — Encounter (HOSPITAL_BASED_OUTPATIENT_CLINIC_OR_DEPARTMENT_OTHER): Payer: Medicare Other | Admitting: General Surgery

## 2022-11-03 DIAGNOSIS — E11621 Type 2 diabetes mellitus with foot ulcer: Secondary | ICD-10-CM | POA: Diagnosis not present

## 2022-11-03 LAB — GLUCOSE, CAPILLARY
Glucose-Capillary: 134 mg/dL — ABNORMAL HIGH (ref 70–99)
Glucose-Capillary: 171 mg/dL — ABNORMAL HIGH (ref 70–99)

## 2022-11-03 NOTE — Progress Notes (Signed)
THEADORE, BLUNCK R ([id number]) [id number] Page 1 of 2 Visit Report for 11/03/2022 Arrival Information Details Patient Name: Date of Service: [place], [name] 11/03/2022 8:00 A M Medical Record Number: [id number] Patient Account Number: [id number] Date of Birth/Sex: Treating RN: 1956-02-18 (66 y.o. Bayard Hugger, Bonita Quin Primary Care Gurkaran Rahm: Foye [name] Other Clinician: Haywood Pao Referring Eddi Hymes: Treating Verlon Carcione/Extender: Kandis Cocking in Treatment: 7 Visit Information History Since Last Visit All ordered tests and consults were completed: Yes Patient Arrived: Knee Scooter Added or deleted any medications: No Arrival Time: 07:59 Any new allergies or adverse reactions: No Accompanied By: self Had a fall or experienced change in No Transfer Assistance: None activities of daily living that may affect Patient Identification Verified: Yes risk of falls: Secondary Verification Process Completed: Yes Signs or symptoms of abuse/neglect since last visito No Patient Requires Transmission-Based Precautions: No Hospitalized since last visit: No Patient Has Alerts: No Implantable device outside of the clinic excluding No cellular tissue based products placed in the center since last visit: Pain Present Now: No Electronic Signature(s) Signed: 11/03/2022 11:25:53 AM By: Haywood Pao CHT EMT BS , , Entered By: Haywood Pao on 11/03/2022 11:25:53 -------------------------------------------------------------------------------- Encounter Discharge Information Details Patient Name: Date of Service: [place], [name] 11/03/2022 8:00 A M Medical Record Number: [id number] Patient Account Number: [id number] Date of Birth/Sex: Treating RN: [birth year] (66 y.o. [name] Primary Care Ghislaine Harcum: Foye [name] Other Clinician: Haywood Pao Referring Bernice Mcauliffe: Treating Gorden Stthomas/Extender: Kandis Cocking in Treatment: 7 Encounter Discharge Information Items Discharge Condition: Stable Ambulatory Status: Knee Scooter Discharge Destination: Home Transportation: Private Auto Accompanied By: self Schedule Follow-up Appointment: No Clinical Summary of Care: Electronic Signature(s) Signed: 11/03/2022 4:52:04 PM By: Haywood Pao CHT EMT BS , , Previous Signature: 11/03/2022 4:51:56 PM Version By: Haywood Pao CHT EMT BS , , Previous Signature: 11/03/2022 4:43:31 PM Version By: Haywood Pao CHT EMT BS , , Entered By: Haywood Pao on 11/03/2022 16:52:04 [name] ([id number]) [id number] Page 2 of 2 -------------------------------------------------------------------------------- Vitals Details Patient Name: Date of Service: [place], [name] 11/03/2022 8:00 A M Medical Record Number: [id number] Patient Account Number: [id number] Date of Birth/Sex: Treating RN: [birth year] (66 y.o. [name] Primary Care Lorrine Killilea: Foye [name] Other Clinician: [name] Referring Anjannette Gauger: Treating Meklit Cotta/Extender: Kandis Cocking in Treatment: 7 Vital Signs Time Taken: 08:17 Temperature (F): 98.6 Height (in): 74 Pulse (bpm): 87 Weight (lbs): 245 Respiratory Rate (breaths/min): 16 Body Mass Index (BMI): 31.5 Blood Pressure (mmHg): 153/80 Capillary Blood Glucose (mg/dl): 416 Reference Range: 80 - 120 mg / dl Electronic Signature(s) Signed: 11/03/2022 11:28:45 AM By: Haywood Pao CHT EMT BS , , Entered By: Haywood Pao on 11/03/2022 11:28:45

## 2022-11-03 NOTE — Progress Notes (Addendum)
[name], [name] ([id number]) [id number] Page 1 of 3 Visit Report for 11/03/2022 HBO Details Patient Name: Date of Service: [name], [name]. 11/03/2022 8:00 A M Medical Record Number: [id number] Patient Account Number: [id number] Date of Birth/Sex: Treating RN: Apr 28, 1956 (66 y.o. 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 HBO Treatment Details Treatment Number: 10 Patient Type: Outpatient Chamber Type: Monoplace Chamber Serial #: [id number] Treatment Protocol: 2.0 ATA with 90 minutes oxygen, with two 5 minute air breaks Treatment Details Compression Rate Down: 1.0 psi / minute De-Compression Rate Up: 3.0 psi / minute A breaks and breathing ir Compress Tx Pressure periods Decompress Decompress Begins Reached (leave unused spaces Begins Ends blank) Chamber Pressure (ATA 1 2 2 2 2 2  --2 1 ) Clock Time (24 hr) 08:43 09:10 09:40 09:45 10:15 10:25 - - 10:17 10:25 Treatment Length: 102 (minutes) Treatment Segments: 3 Vital Signs Capillary Blood Glucose Reference Range: 80 - 120 mg / dl HBO Diabetic Blood Glucose Intervention Range: <131 mg/dl or mg/dl Time Capillary Blood Glucose Pulse Blood Respiratory Action Type: Vitals Pulse: Temperature: Glucose Oximetry Pressure: Rate: Meter #: Taken: Taken: (mg/dl): (%) Patient given 8 oz Glucerna Shake and snack see Pre 08:17 153/80 87 16 98.6 134 1 note Post 10:25 180/82 86 16 98.4 171 1 follow up event Treatment Response Treatment Toleration: Poor Adverse Events: 1:Oxygen Toxicity without seizure Treatment Completion Status:  Treatment Completed with Adverse Event Treatment Notes Pre-treatment patient stated he had not ate this morning with an initial 134 mg/dL glucose measurement. More specifically patient stated "not much" in response to my question if he had eaten this morning with HBO T echnician DGrant present. I informed physician of this and requested to give patient 2 cups of peanut butter and crackers and a single 8 ounce Glucerna in order to ensure patient had enough nourishment prior to treatment. Physician agreed. Patient ate 2 cups of peanut butter, two saltine crackers, and drank an 8 oz Glucerna. Patient has trouble with ear equalization, therefore, during compression of the chamber rate set was 1 psi/min with strategic stops. Patient tolerated travel well per patient reaching depth at 0910. Patient had his first air break at [phone]. Approximately 1010 patient knocked on the acrylic of the chamber. I talked to the patient via chamber phone. Patient stated that something had just happened. He described it as he felt he had shook. I asked if he felt that the chamber shook and then confirmed that nothing had touched the chamber. Patient stated "no, not the chamber" and that he felt like he shook and that he "possibly feel[s] shaky." He continued to inform me that something didn't feel quite right. Patient at this time (1011) seemed to not be able to comprehend or talk. After a short time, approximately 5 seconds, patient regained ability to communicate and I asked him to place his face mask on and turned on the medical air at approximately 45 L/min. I called Dr. [id number]. Dr. Lady Gary spoke with patient. While talking patient had face mask off. Emergency procedures were activated; RN called to HBO suite. Patient had 2 similar instances of what appeared to be difficulty speaking and comprehending, followed by ability to speak and clarity. Upon the second time  of regaining speech, Dr. Lady Gary ordered the  treatment to be aborted and a decompression rate at as fast a rate as safe. 1017:Chamber decompressed at the rate of 3 psi/min. I asked [name] to put his face mask back on making sure it was on good. Patient comprehended and communicated agreement. Patient put the strap around his head and placed the mask on his face appropriately, and I increased the flow rate of medical air to 50 L/min and told the patient to breath deeply to inhale the air. Upon reaching approximately 5 psig, I decreased flow rate of medical air to 30 L/min due to less [name], [name] ([id number]) [id number] Page 2 of 3 need to compensate against the pressure gradient in the chamber. Patient was able to communicate during decompression with air mask on. I repeatedly asked patient if his ears were okay. He denied any pain or issues equalizing pressure. Upon reaching 1 ATA, chamber was opened and patient's vitals were taken. CBG: 171, BP:180/82, Pulse:86, T emp:98.4 Patient was allowed to regain composure and prepare for departure. Dr. Lady Gary cleared patient for discharge and patient was alert and oriented. Physician HBO Attestation: I certify that I supervised this HBO treatment in accordance with Medicare guidelines. A trained emergency response team is readily available per Yes hospital policies and procedures. Continue HBOT as ordered. Yes Electronic Signature(s) Signed: 11/08/2022 7:35:26 AM By: [name] MD FACS Previous Signature: 11/04/2022 12:13:37 AM Version By: Haywood Pao CHT EMT BS , , Previous Signature: 11/04/2022 12:10:06 AM Version By: Haywood Pao CHT EMT BS , , Previous Signature: 11/03/2022 11:50:58 PM Version By: Haywood Pao CHT EMT BS , , Previous Signature: 11/03/2022 5:05:35 PM Version By: [name] MD FACS Previous Signature: 11/03/2022 4:51:16 PM Version By: Haywood Pao CHT EMT BS , , Previous Signature: 11/03/2022 4:42:49 PM Version  By: Haywood Pao CHT EMT BS , , Previous Signature: 11/03/2022 12:29:43 PM Version By: Haywood Pao CHT EMT BS , , Entered By: [name] on 11/08/2022 07:35:26 -------------------------------------------------------------------------------- HBO Safety Checklist Details Patient Name: Date of Service: Okay, [name] 11/03/2022 8:00 A M Medical Record Number: [id number] Patient Account Number: [id number] Date of Birth/Sex: Treating RN: 1956/10/01 (66 y.o. Bayard Hugger, Bonita Quin Primary Care Kerem Gilmer: Foye [name] Other Clinician: [name] Referring Paislie Tessler: Treating Adonna Horsley/Extender: Kandis Cocking in Treatment: 7 HBO Safety Checklist Items Safety Checklist Consent Form Signed Patient voided / foley secured and emptied When did you last eato Last PM 2130 Last dose of injectable or oral agent 2215 Ostomy pouch emptied and vented if applicable NA All implantable devices assessed, documented and approved NA Intravenous access site secured and place NA Valuables secured Linens and cotton and cotton/polyester blend (less than 51% polyester) Personal oil-based products / skin lotions / body lotions removed Wigs or hairpieces removed NA Smoking or tobacco materials removed NA Books / newspapers / magazines / loose paper removed Cologne, aftershave, perfume and deodorant removed Jewelry removed (may wrap wedding band) Make-up removed NA Hair care products removed NA Battery operated devices (external) removed NA Heating patches and chemical warmers removed NA Titanium eyewear removed NA Nail polish cured greater than 10 hours NA Casting material cured greater than 10 hours NA Hearing aids removed NA Loose dentures or partials removed dentures removed Prosthetics have been removed NA Patient demonstrates correct use of air break device (if applicable) Patient concerns have been addressed Patient grounding bracelet on  and cord attached to chamber Specifics for Inpatients (complete in  addition to above) Medication sheet sent with patient NA Intravenous medications needed or due during therapy sent with patient NA Drainage tubes (e.g. nasogastric tube or chest tube secured and vented) NA [name], [name] ([id number]) [id number] Page 3 of 3 Endotracheal or Tracheotomy tube secured NA Cuff deflated of air and inflated with saline NA Airway suctioned NA Notes Paper version used prior to treatment. MSc[facility] as well. Electronic Signature(s) Signed: 11/03/2022 11:34:28 AM By: Haywood Pao CHT EMT BS , , Entered By: Haywood Pao on 11/03/2022 11:34:28

## 2022-11-04 NOTE — Progress Notes (Signed)
SOL, ENGLERT ([id number]) [id number] Page 1 of 1 Visit Report for 11/03/2022 SuperBill Details Patient Name: Date of Service: [name], [name] [name] 11/03/2022 Medical Record Number: [id number] Patient Account Number: [id number] Date of Birth/Sex: Treating RN: [birth year] (66 y.o. 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, left ankle and foot E11.65 Type 2 diabetes mellitus with hyperglycemia E11.621 Type 2 diabetes mellitus with foot ulcer Facility Procedures CPT4 Code Description Modifier Quantity [id number]-(Facility Use Only) HBOT full body chamber, , 3 ICD-10 Diagnosis Description [phone] Other chronic osteomyelitis, 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 Physician Procedures Quantity CPT4 Code Description Modifier [id number] - WC PHYS HYPERBARIC OXYGEN THERAPY 1 ICD-10 Diagnosis Description 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, 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

## 2022-11-08 ENCOUNTER — Encounter (HOSPITAL_BASED_OUTPATIENT_CLINIC_OR_DEPARTMENT_OTHER): Payer: Self-pay

## 2022-11-08 ENCOUNTER — Encounter (HOSPITAL_BASED_OUTPATIENT_CLINIC_OR_DEPARTMENT_OTHER): Payer: Medicare Other | Admitting: General Surgery

## 2022-11-08 ENCOUNTER — Encounter (HOSPITAL_BASED_OUTPATIENT_CLINIC_OR_DEPARTMENT_OTHER): Payer: Medicare Other | Admitting: Internal Medicine

## 2022-11-08 DIAGNOSIS — E11621 Type 2 diabetes mellitus with foot ulcer: Secondary | ICD-10-CM | POA: Diagnosis not present

## 2022-11-08 LAB — GLUCOSE, CAPILLARY
Glucose-Capillary: 121 mg/dL — ABNORMAL HIGH (ref 70–99)
Glucose-Capillary: 128 mg/dL — ABNORMAL HIGH (ref 70–99)

## 2022-11-08 NOTE — Progress Notes (Signed)
[name], [name] ([id number]) [id number] Page 1 of 2 Visit Report for 11/08/2022 Arrival Information Details Patient Name: Date of Service: [place], [name]. 11/08/2022 9:00 A M Medical Record Number: [id number] Patient Account Number: [id number] Date of Birth/Sex: Treating RN: 08/30/1956 (66 y.o. [name], [name] Primary Care Chyanne Kohut: Foye [name] Other Clinician: Haywood Pao Referring Kayline Sheer: Treating Estellar Cadena/Extender: Kandis Cocking in Treatment: 8 Visit Information History Since Last Visit All ordered tests and consults were completed: Yes Patient Arrived: Knee Scooter Added or deleted any medications: No Arrival Time: 07:26 Any new allergies or adverse reactions: No Accompanied By: self Had a fall or experienced change in No Transfer Assistance: None activities of daily living that may affect Patient Identification Verified: Yes risk of falls: Secondary Verification Process Completed: Yes Signs or symptoms of abuse/neglect since last visito No Patient Requires Transmission-Based Precautions: No Hospitalized since last visit: No Patient Has Alerts: No Implantable device outside of the clinic excluding No cellular tissue based products placed in the center since last visit: Pain Present Now: No Electronic Signature(s) Signed: 11/08/2022 11:50:19 AM By: Haywood Pao CHT EMT BS , , Entered By: Haywood Pao on 11/08/2022 11:50:18 -------------------------------------------------------------------------------- General Visit Notes Details Patient Name: Date of Service: [name], [name] 11/08/2022 9:00 A M Medical Record Number: [id number] Patient Account Number: [id number] Date of Birth/Sex: Treating RN: 22-Dec-1955 (66 y.o. Tammy Sours Primary Care Sunday Klos: Foye [name] Other Clinician: Haywood Pao Referring Sharina Petre: Treating Trula Frede/Extender: Kandis Cocking in Treatment: 8 Notes Patient had wound care appointment prior to hyperbaric today. Blood glucose at 0859 was 121 mg/dL. Per protocol, patient was given 8 oz Glucerna. Patient completed consuming the drink at 0905. 0938 Blood glucose was on upward trend at 128 mg/dL. Per protocol, patient was okay to start treatment. Informed physician. Patient cleared for treatment. Patient shared that he was worried based on his glucose level today and treatment last Wednesday, requested to forego today's treatment. Informed physician. Electronic Signature(s) Signed: 11/08/2022 11:58:25 AM By: Haywood Pao CHT EMT BS , , Entered By: Haywood Pao on 11/08/2022 11:58:25 Vitals Details -------------------------------------------------------------------------------- [name] ([id number]) [id number] Page 2 of 2 Patient Name: Date of Service: [place], [name]. 11/08/2022 9:00 A M Medical Record Number: [id number] Patient Account Number: [id number] Date of Birth/Sex: Treating RN: 1956-03-28 (66 y.o. [name], [name] Primary Care Jakaya Jacobowitz: Foye [name] Other Clinician: Haywood Pao Referring Biannca Scantlin: Treating Lorence Nagengast/Extender: Kandis Cocking in Treatment: 8 Vital Signs Time Taken: 08:57 Temperature (F): 98.6 Height (in): 74 Pulse (bpm): 82 Weight (lbs): 245 Respiratory Rate (breaths/min): 18 Body Mass Index (BMI): 31.5 Blood Pressure (mmHg): 124/69 Capillary Blood Glucose (mg/dl): 628 Reference Range: 80 - 120 mg / dl Electronic Signature(s) Signed: 11/08/2022 11:50:54 AM By: Haywood Pao CHT EMT BS , , Entered By: Haywood Pao on 11/08/2022 11:50:54

## 2022-11-08 NOTE — Progress Notes (Signed)
[name], [name] ([id number]) [id number] Page 1 of 12 Visit Report for 11/08/2022 Chief Complaint Document Details Patient Name: Date of Service: [name]. 11/08/2022 8:00 A M Medical Record Number: [id number] Patient Account Number: [id number] Date of Birth/Sex: Treating RN: 11/29/1956 (66 y.o. M) Primary Care Provider: Nelda Bucks Other Clinician: Referring Provider: Treating Provider/Extender: Doristine Bosworth in Treatment: 8 Information Obtained from: Patient Chief Complaint Patients presents for treatment of an open diabetic ulcer and evaluation for hyperbaric oxygen therapy Electronic Signature(s) Signed: 11/08/2022 8:33:11 AM By: Fredirick Maudlin MD FACS Entered By: Fredirick Maudlin on 11/08/2022 08:33:11 -------------------------------------------------------------------------------- Debridement Details Patient Name: Date of Service: [name], [name] 11/08/2022 8:00 A M Medical Record Number: [id number] Patient Account Number: [id number] Date of Birth/Sex: Treating RN: 1956/12/04 (66 y.o. [name] Primary Care Provider: Nelda Bucks Other Clinician: Referring Provider: Treating Provider/Extender: Doristine Bosworth in Treatment: 8 Debridement Performed for Assessment: Wound #2 Left,Dorsal Foot Performed By: Physician Fredirick Maudlin, MD Debridement Type: Debridement Severity of Tissue Pre Debridement: Fat layer exposed Level of Consciousness (Pre-procedure): Awake and Alert Pre-procedure Verification/Time Out Yes - 08:16 Taken: Start Time: 08:17 T Area Debrided (L x W): otal 2.6 (cm) x 1.5 (cm) = 3.9 (cm) Tissue and other material debrided: Non-Viable, Slough, Slough Level: Non-Viable Tissue Debridement Description: Selective/Open Wound Instrument: Curette Bleeding: Minimum Hemostasis Achieved: Pressure Response to Treatment: Procedure was tolerated well Level of  Consciousness (Post- Awake and Alert procedure): Post Debridement Measurements of Total Wound Length: (cm) 2.6 Width: (cm) 1.5 Depth: (cm) 0.1 Volume: (cm) 0.306 Character of Wound/Ulcer Post Debridement: Requires Further Debridement Severity of Tissue Post Debridement: Fat layer exposed Post Procedure Diagnosis Same as Pre-procedure [name], [name] ([id number]) [id number] Page 2 of 12 Notes Scribed for Dr. Celine [name] by [name] East, RN Electronic Signature(s) Signed: 11/08/2022 11:13:19 AM By: Fredirick Maudlin MD FACS Signed: 11/08/2022 4:55:50 PM By: [name] East RN Entered By: [name] on 11/08/2022 08:18:54 -------------------------------------------------------------------------------- Debridement Details Patient Name: Date of Service: [name], [name] 11/08/2022 8:00 A M Medical Record Number: [id number] Patient Account Number: [id number] Date of Birth/Sex: Treating RN: 05-13-1956 (66 y.o. [name] Primary Care Provider: Nelda Bucks Other Clinician: Referring Provider: Treating Provider/Extender: Doristine Bosworth in Treatment: 8 Debridement Performed for Assessment: Wound #1 Left Calcaneus Performed By: Physician Fredirick Maudlin, MD Debridement Type: Debridement Severity of Tissue Pre Debridement: Fat layer exposed Level of Consciousness (Pre-procedure): Awake and Alert Pre-procedure Verification/Time Out Yes - 08:16 Taken: Start Time: 08:17 T Area Debrided (L x W): otal 5.7 (cm) x 5 (cm) = 28.5 (cm) Tissue and other material debrided: Non-Viable, Callus, Skin: Epidermis, Biofilm Level: Skin/Epidermis Debridement Description: Selective/Open Wound Instrument: Curette Bleeding: Minimum Hemostasis Achieved: Pressure Response to Treatment: Procedure was tolerated well Level of Consciousness (Post- Awake and Alert procedure): Post Debridement Measurements of Total Wound Length: (cm) 5.7 Width:  (cm) 5 Depth: (cm) 0.3 Volume: (cm) 6.715 Character of Wound/Ulcer Post Debridement: Requires Further Debridement Severity of Tissue Post Debridement: Fat layer exposed Post Procedure Diagnosis Same as Pre-procedure Notes Scribed for Dr. Celine [name] by [name] East, RN Electronic Signature(s) Signed: 11/08/2022 11:13:19 AM By: Fredirick Maudlin MD FACS Signed: 11/08/2022 4:55:50 PM By: [name] East RN Entered By: [name] on 11/08/2022 08:23:09 -------------------------------------------------------------------------------- HPI Details Patient Name: Date of Service: [name], [name] 11/08/2022 8:00 A M Medical Record Number: [id number] Patient Account Number: [id number] [name], [name] ([id number]) [id number] Page 3 of 12  Date of Birth/Sex: Treating RN: 1956/10/21 (66 y.o. M) Primary Care Provider: Other Clinician: Nelda Bucks Referring Provider: Treating Provider/Extender: Doristine Bosworth in Treatment: 8 History of Present Illness HPI Description: ADMISSION 09/10/2022 This is a 66 year old poorly controlled type II diabetic (last hemoglobin A1c 10.8%) who has had an ulcer on his heel for over 3 years. He has been seen in multiple wound care centers, including [facility] and [facility]. He reports that at least 3 doctors have recommended that he undergo below-knee amputation. He most recently met with Dr. Catalina Gravel, a vascular surgeon affiliated with [facility]. 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 Unfortunately, the patient has some extenuating social circumstances including the fact that he cares for his wife who has stage IV colon cancer and still works, driving vehicles for [employer]. 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 He was recently hospitalized at [facility] for sepsis and currently  has a PICC line through which he receives IV antibiotics. He reports having had another MRI during that hospital stay along with a chest x-ray and EKG. He apparently contacted one of the hyperbaric therapy techs here and asked a number of questions about hyperbaric oxygen treatments. He subsequently self-referred to our center to undergo further evaluation and management. I mention to him that [facility] actually has hyperbaric chambers, but he states that he lives in [place] and this would be more convenient for him given the intensive nature of the therapy and time requirement. ABI in clinic today was 0.94. The patient actually has 2 wounds. There is a wound on the dorsum of his left foot with heavy black eschar and slough present. After debridement, this was demonstrated to involve the muscle and the extensor tendons are exposed. On his heel, there is essentially a "shark bite" type wound, with much of the heel fat pad absent. The muscle layer is exposed. There is blue-green staining around the perimeter of the wound, but no significant odor. He says he has been applying collagen to the wound on his heel and Silvadene and Betadine to the wound on his dorsal foot. 09/20/2022: The heel wound is quite macerated with wet periwound callus. There is slough accumulation on the surface. The dorsal foot wound looks better this week. There is still exposed tendon, but it is fairly clean with just a little biofilm buildup. We are still working on gathering the required documentation to submit for pretreatment review for hyperbaric oxygen therapy. 09/29/2022: The dorsal foot wound continues to improve. I do not see any exposed tendon at this point. There is just some slough accumulation on the wound surface. He continues to have very wet macerated periwound callus on his heel. There is slough on the surface, but it is loose and thin. There is an area of undermining at the 11 o'clock position, but the overlying  skin and subcutaneous tissue is healthy and viable. He has been approved for hyperbaric oxygen therapy and will start treatment tomorrow. 10/06/2022: Continued contraction and improvement of the dorsal foot wound. There is just a little bit of slough accumulation on the wound surface. The periwound callus continues to accumulate on the heel wound and it is quite macerated. It is also persistently found with blue-green staining present. He did initiate his hyperbaric oxygen therapy, but has had significant difficulty with decompression. He will be going to ENT to have PE tubes  placed. 10/18/2022: His hyperbaric oxygen therapy is on hold while his otological issues are being addressed. He came in again today with the periwound skin on both the dorsal aspect of his foot and his calcaneus completely macerated. The blue-green discoloration, however, has abated and the undermining on the calcaneus has improved. The dorsal foot wound has also contracted somewhat. 10/26/2022: The dorsal foot wound continues to contract and fill with good granulation tissue. The heel, once again, has a rim of macerated callus, but the undermining and tunneling continues to contract. He has completed his oral antibiotics and has been using the [facility] topical compounded antibiotic for his dressing changes at home. He is scheduled to see ENT this afternoon. 11/08/2022: The dorsal foot wound is flush with the surrounding skin and has a good granulation tissue surface. There is some slough accumulation. As usual, the heel has a rim of macerated callus but the dimensions are smaller and the tunneling and undermining have contracted further. He has resumed his hyperbaric oxygen therapy. Electronic Signature(s) Signed: 11/08/2022 8:34:32 AM By: Fredirick Maudlin MD FACS Entered By: Fredirick Maudlin on 11/08/2022 08:34:32 -------------------------------------------------------------------------------- Physical Exam Details Patient  Name: Date of Service: [name], [name] 11/08/2022 8:00 A M Medical Record Number: [id number] Patient Account Number: [id number] Date of Birth/Sex: Treating RN: [birth year] (66 y.o. M) Primary Care Provider: Nelda Bucks Other Clinician: Referring Provider: Treating Provider/Extender: Doristine Bosworth in Treatment: 8 Constitutional . . . . No acute distress. Respiratory Normal work of breathing on room air. Notes 11/08/2022: The dorsal foot wound is flush with the surrounding skin and has a good granulation tissue surface. There is some slough accumulation. As usual, the heel has a rim of macerated callus but the dimensions are smaller and the tunneling and undermining have contracted further. KAMARE, CASPERS [name] ([id number]) [id number] Page 4 of 12 Electronic Signature(s) Signed: 11/08/2022 8:35:02 AM By: Fredirick Maudlin MD FACS Entered By: Fredirick Maudlin on 11/08/2022 08:35:02 -------------------------------------------------------------------------------- Physician Orders Details Patient Name: Date of Service: [name], [name] 11/08/2022 8:00 A M Medical Record Number: [id number] Patient Account Number: [id number] Date of Birth/Sex: Treating RN: 1956/05/20 (66 y.o. 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 - Dr. Alvera Singh Return A Bathing/ Shower/  Hygiene May shower with protection but do not get wound dressing(s) wet. Edema Control - Lymphedema / SCD / Other Left Lower Extremity Elevate legs to the level of the heart or above for 30 minutes daily and/or when sitting, a frequency of: Avoid standing for long periods of time. Hyperbaric Oxygen Therapy Wound #1 Left Calcaneus Evaluate for HBO Therapy Indication: - chronic refractory osteomyelitis If appropriate for treatment, begin HBOT per protocol: 2.5 ATA for 90 Minutes with 2 Five (5) Minute A Breaks ir Total Number of Treatments: - 40 One treatments per day (delivered Monday through Friday unless otherwise specified in Special Instructions below): Finger stick Blood Glucose Pre- and Post- HBOT Treatment. Follow Hyperbaric Oxygen Glycemia Protocol A frin (Oxymetazoline HCL) 0.05% nasal spray - 1  spray in both nostrils daily as needed prior to HBO treatment for difficulty clearing ears Wound Treatment Wound #1 - Calcaneus Wound Laterality: Left Cleanser: Soap and Water 1 x Per Day/30 Days Discharge Instructions: May shower and wash wound with dial antibacterial soap and water prior to dressing change. Peri-Wound Care: Sween Lotion (Moisturizing lotion) 1 x Per Day/30 Days Discharge Instructions: Apply moisturizing lotion as directed Topical: Gentamicin 1 x Per Day/30 Days Discharge Instructions: As directed by physician Prim Dressing: KerraCel Ag Gelling Fiber Dressing, 4x5 in (silver alginate) (DME) (Generic) 1 x Per Day/30 Days ary Discharge Instructions: Apply silver alginate to wound bed as instructed Secondary Dressing: ABD Pad, 5x9 (Generic) 1 x Per Day/30 Days Discharge Instructions: Apply over primary dressing as directed. Secondary Dressing: Zetuvit Plus Silicone Border Dressing 5x5 (in/in) (DME) (Generic) 1 x Per Day/30 Days Discharge Instructions: Apply silicone border over primary dressing as directed. [name], [name] ([id number])  [id number] Page 5 of 12 Secured With: 71M Medipore Public affairs consultant Surgical T 2x10 (in/yd) (DME) (Generic) 1 x Per Day/30 Days ape Discharge Instructions: Secure with tape as directed. Compression Wrap: Kerlix Roll 4.5x3.1 (in/yd) (DME) (Generic) 1 x Per Day/30 Days Discharge Instructions: Apply Kerlix and Coban compression as directed. Compression Wrap: Coban Self-Adherent Wrap 4x5 (in/yd) (DME) (Generic) 1 x Per Day/30 Days Discharge Instructions: Apply over Kerlix as directed. Wound #2 - Foot Wound Laterality: Dorsal, Left Cleanser: Soap and Water 1 x Per Day/30 Days Discharge Instructions: May shower and wash wound with dial antibacterial soap and water prior to dressing change. Peri-Wound Care: Sween Lotion (Moisturizing lotion) 1 x Per Day/30 Days Discharge Instructions: Apply moisturizing lotion as directed Topical: Gentamicin 1 x Per Day/30 Days Discharge Instructions: As directed by physician Prim Dressing: KerraCel Ag Gelling Fiber Dressing, 4x5 in (silver alginate) (Generic) 1 x Per Day/30 Days ary Discharge Instructions: Apply silver alginate to wound bed as instructed Secondary Dressing: ALLEVYN Heel 4 1/2in x 5 1/2in / 10.5cm x 13.5cm (Generic) 1 x Per Day/30 Days Discharge Instructions: Apply over primary dressing as directed. Secondary Dressing: Zetuvit Plus Silicone Border Dressing 5x5 (in/in) (Generic) 1 x Per Day/30 Days Discharge Instructions: Apply silicone border over primary dressing as directed. Secured With: 71M Medipore Public affairs consultant Surgical T 2x10 (in/yd) 1 x Per Day/30 Days ape Discharge Instructions: Secure with tape as directed. Compression Wrap: Kerlix Roll 4.5x3.1 (in/yd) (Generic) 1 x Per Day/30 Days Discharge Instructions: Apply Kerlix and Coban compression as directed. Compression Wrap: Coban Self-Adherent Wrap 4x5 (in/yd) (Generic) 1 x Per Day/30 Days Discharge Instructions: Apply over Kerlix as directed. GLYCEMIA INTERVENTIONS  PROTOCOL PRE-HBO GLYCEMIA INTERVENTIONS ACTION INTERVENTION Obtain pre-HBO capillary blood glucose (ensure 1 physician order is in chart). A. Notify HBO physician and await physician orders. 2 If result is 70 mg/dl or below: B. If the result meets the hospital definition of a critical result, follow hospital policy. A. Give patient an 8 ounce Glucerna Shake, an 8 ounce Ensure, or 8 ounces of a Glucerna/Ensure equivalent dietary supplement*. B. Wait 30 minutes. If result is 71 mg/dl to 130 mg/dl: C. Retest patients capillary blood glucose (CBG). D. If result greater than or equal to 110 mg/dl, proceed with HBO. If result less than 110 mg/dl, notify HBO physician and consider holding HBO. If result is 131 mg/dl to 249 mg/dl: A. Proceed with HBO. A. Notify HBO physician and await physician orders. B. It is recommended to hold HBO and do If result is 250 mg/dl or greater: blood/urine ketone testing. C.  If the result meets the hospital definition of a critical result, follow hospital policy. POST-HBO GLYCEMIA INTERVENTIONS ACTION INTERVENTION Obtain post HBO capillary blood glucose (ensure 1 physician order is in chart). A. Notify HBO physician and await physician orders. 2 If result is 70 mg/dl or below: B. If the result meets the hospital definition of a critical result, follow hospital policy. A. Give patient an 8 ounce Glucerna Shake, an 8 ounce Ensure, or 8 ounces of a Glucerna/Ensure equivalent dietary supplement*. B. Wait 15 minutes for symptoms of If result is 71 mg/dl to 100 mg/dl: hypoglycemia (i.e. nervousness, anxiety, sweating, chills, clamminess, irritability, confusion, tachycardia or dizziness). C. If patient asymptomatic, discharge patient. [name], [name] ([id number]) [id number] Page 6 of 12 If patient symptomatic, repeat capillary blood glucose (CBG) and notify HBO physician. If result is 101 mg/dl to 249 mg/dl: A.  Discharge patient. A. Notify HBO physician and await physician orders. B. It is recommended to do blood/urine ketone If result is 250 mg/dl or greater: testing. C. If the result meets the hospital definition of a critical result, follow hospital policy. *Juice or candies are NOT equivalent products. If patient refuses the Glucerna or Ensure, please consult the hospital dietitian for an appropriate substitute. Electronic Signature(s) Signed: 11/08/2022 11:13:19 AM By: Fredirick Maudlin MD FACS Signed: 11/08/2022 4:55:50 PM By: [name] East RN Entered By: [name] on 11/08/2022 08:41:18 -------------------------------------------------------------------------------- Problem List Details Patient Name: Date of Service: [place], [name] 11/08/2022 8:00 A M Medical Record Number: [id number] Patient Account Number: [id number] Date of Birth/Sex: Treating RN: 1956-07-06 (66 y.o. 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 09/10/2022 No Yes L97.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 09/10/2022 No Yes C78.938 Other chronic osteomyelitis, left ankle and foot 09/10/2022 No Yes E11.65 Type 2 diabetes mellitus with hyperglycemia 09/10/2022 No Yes E11.621 Type 2 diabetes mellitus with foot ulcer 09/10/2022 No Yes Inactive Problems Resolved Problems Electronic Signature(s) Signed: 11/08/2022 8:32:57 AM By: Fredirick Maudlin MD FACS Entered By: Fredirick Maudlin on 11/08/2022 08:32:56 [name] ([id number]) [id number] Page 7 of 12 -------------------------------------------------------------------------------- Progress Note Details Patient Name:  Date of Service: [place], [name] 11/08/2022 8:00 A M Medical Record Number: [id number] Patient Account Number: [id number] Date of Birth/Sex: Treating RN: 05-Sep-1956 (66 y.o. M) Primary Care Provider: Nelda Bucks Other Clinician: Referring Provider: Treating Provider/Extender: Doristine Bosworth in Treatment: 8 Subjective Chief Complaint Information obtained from Patient Patients presents for treatment of an open diabetic ulcer and evaluation for hyperbaric oxygen therapy History of Present Illness (HPI) ADMISSION 09/10/2022 This is a 66 year old poorly controlled type II diabetic (last hemoglobin A1c 10.8%) who has had an ulcer on his heel for over 3 years. He has been seen in multiple wound care centers, including [facility] and [facility]. He reports that at least 3 doctors have recommended that he undergo below-knee amputation. He most recently met with Dr. Catalina Gravel, a vascular surgeon affiliated with [facility]. 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 Unfortunately, the patient has some extenuating social circumstances including the fact that he cares for his wife who has stage IV colon cancer and still works, driving vehicles for [employer]. 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 He was recently hospitalized at [facility] for sepsis and currently has a PICC line through which he receives IV antibiotics. He reports having had another MRI during that hospital stay along with a chest x-ray and EKG. He apparently contacted one of the hyperbaric therapy techs here and asked a number of questions about hyperbaric oxygen treatments. He subsequently self-referred to our center to undergo further evaluation and management. I mention to him that [facility] actually has hyperbaric chambers, but he states that he lives in [place] and this  would be more convenient for him given the intensive nature of the therapy and time requirement. ABI in clinic today was 0.94. The patient actually has 2 wounds. There is a wound on the dorsum of his left foot with heavy black eschar and slough present. After debridement, this was demonstrated to involve the muscle and the extensor tendons are exposed. On his heel, there is essentially a "shark bite" type wound, with much of the heel fat pad absent. The muscle layer is exposed. There is blue-green staining around the perimeter of the wound, but no significant odor. He says he has been applying collagen to the wound on his heel and Silvadene and Betadine to the wound on his dorsal foot. 09/20/2022: The heel wound is quite macerated with wet periwound callus. There is slough accumulation on the surface. The dorsal foot wound looks better this week. There is still exposed tendon, but it is fairly clean with just a little biofilm buildup. We are still working on gathering the required documentation to submit for pretreatment review for hyperbaric oxygen therapy. 09/29/2022: The dorsal foot wound continues to improve. I do not see any exposed tendon at this point. There is just some slough accumulation on the wound surface. He continues to have very wet macerated periwound callus on his heel. There is slough on the surface, but it is loose and thin. There is an area of undermining at the 11 o'clock position, but the overlying skin and subcutaneous tissue is healthy and viable. He has been approved for hyperbaric oxygen therapy and will start treatment tomorrow. 10/06/2022: Continued contraction and improvement of the dorsal foot wound. There is just a little bit of slough accumulation on the wound surface. The periwound callus continues to accumulate on the heel wound and it is quite macerated. It is also persistently found with blue-green staining present. He did initiate his hyperbaric oxygen therapy, but  has had significant difficulty with decompression. He will be going to ENT to have PE tubes placed. 10/18/2022: His hyperbaric oxygen therapy is on hold while his otological issues are being addressed. He came in again today with the periwound skin on both the dorsal aspect of his foot and his calcaneus completely macerated. The blue-green discoloration, however, has abated and the undermining on the calcaneus has improved. The dorsal foot wound has also contracted somewhat. 10/26/2022: The dorsal foot wound continues to contract and fill with good granulation tissue. The heel, once again, has a rim of macerated callus, but the undermining and tunneling continues to contract. He has completed his oral antibiotics and has been using the [facility] topical compounded antibiotic for his dressing changes at home. He is scheduled to see ENT this afternoon. 11/08/2022: The dorsal foot wound is flush with the surrounding skin and has a good granulation tissue surface. There is some slough accumulation. As usual, the heel has a rim of macerated callus  but the dimensions are smaller and the tunneling and undermining have contracted further. He has resumed his hyperbaric oxygen therapy. Patient History Family History Cancer - Father,Mother, Diabetes - Mother,Father, Lung Disease - Father, Thyroid Problems - Mother, No family history of Heart Disease, Hereditary Spherocytosis, Hypertension, Kidney Disease, Seizures, Stroke, Tuberculosis. Social History Never smoker, Marital Status - Married, Alcohol Use - Never, Drug Use - No History, Caffeine Use - Daily. Medical History Eyes Patient has history of Cataracts Denies history of Glaucoma, Optic Neuritis Ear/Nose/Mouth/Throat Denies history of Chronic sinus problems/congestion, Middle ear problems Respiratory Patient has history of Sleep Apnea Cardiovascular Patient has history of Hypertension Gastrointestinal Denies history of Cirrhosis , Colitis,  Crohnoos, Hepatitis A, Hepatitis B, Hepatitis C Endocrine ODEN, LINDAMAN [name] ([id number]) [id number] Page 8 of 12 Patient has history of Type II Diabetes Immunological Denies history of Lupus Erythematosus, Raynaudoos, Scleroderma Musculoskeletal Patient has history of Osteomyelitis - 2023 Neurologic Patient has history of Neuropathy - Bila lower extremities Oncologic Denies history of Received Chemotherapy, Received Radiation Psychiatric Denies history of Anorexia/bulimia, Confinement Anxiety Hospitalization/Surgery History - I and D Left calcaneus. - back surgery- laminectomy. - eye surgery- Bila cataracts. - shoulder arthroscopy. Medical A Surgical History Notes nd Cardiovascular hyperlipidemia Genitourinary AKI Objective Constitutional No acute distress. Vitals Time Taken: 7:59 AM, Height: 74 in, Weight: 245 lbs, BMI: 31.5, Temperature: 98.7 F, Pulse: 82 bpm, Respiratory Rate: 18 breaths/min, Blood Pressure: 125/61 mmHg, Capillary Blood Glucose: 202 mg/dl. Respiratory Normal work of breathing on room air. General Notes: 11/08/2022: The dorsal foot wound is flush with the surrounding skin and has a good granulation tissue surface. There is some slough accumulation. As usual, the heel has a rim of macerated callus but the dimensions are smaller and the tunneling and undermining have contracted further. Integumentary (Hair, Skin) Wound #1 status is Open. Original cause of wound was Pressure Injury. The date acquired was: 03/09/2019. The wound has been in treatment 8 weeks. The wound is located on the Left Calcaneus. The wound measures 5.7cm length x 5cm width x 0.3cm depth; 22.384cm^2 area and 6.715cm^3 volume. There is Fat Layer (Subcutaneous Tissue) exposed. There is no tunneling noted, however, there is undermining starting at 4:00 and ending at 6:00 with a maximum distance of 1.5cm. There is a medium amount of serosanguineous drainage noted. The  wound margin is fibrotic, thickened scar. There is medium (34-66%) red, pink granulation within the wound bed. There is a medium (34-66%) amount of necrotic tissue within the wound bed including Eschar and Adherent Slough. The periwound skin appearance had no abnormalities noted for color. The periwound skin appearance exhibited: Callus, Maceration. The periwound skin appearance did not exhibit: Dry/Scaly. Periwound temperature was noted as No Abnormality. Wound #2 status is Open. Original cause of wound was Pressure Injury. The date acquired was: 07/09/2022. The wound has been in treatment 8 weeks. The wound is located on the Left,Dorsal Foot. The wound measures 2.6cm length x 1.5cm width x 0.1cm depth; 3.063cm^2 area and 0.306cm^3 volume. There is Fat Layer (Subcutaneous Tissue) exposed. There is no tunneling or undermining noted. There is a medium amount of serosanguineous drainage noted. The wound margin is flat and intact. There is medium (34-66%) pink granulation within the wound bed. There is a medium (34-66%) amount of necrotic tissue within the wound bed including Eschar and Adherent Slough. The periwound skin appearance had no abnormalities noted for texture. The periwound skin appearance had no abnormalities noted for color. The periwound skin appearance exhibited: Maceration. Periwound temperature  was noted as No Abnormality. 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 There was a Selective/Open Wound Skin/Epidermis  Debridement with a total area of 28.5 sq cm performed by Fredirick Maudlin, MD. With the following instrument(s): Curette to remove Non-Viable tissue/material. Material removed includes Callus, Skin: Epidermis, and Biofilm. No specimens were taken. A time out was conducted at 08:16, prior to the start of the procedure. A Minimum amount of bleeding was controlled with Pressure. The procedure was [name], [name] ([id number]) [id number] Page 9 of 12 tolerated well. Post Debridement Measurements: 5.7cm length x 5cm width x 0.3cm depth; 6.715cm^3 volume. Character of Wound/Ulcer Post Debridement requires further debridement. Severity of Tissue Post Debridement is: Fat layer exposed. Post procedure Diagnosis Wound #1: Same as Pre-Procedure General Notes: Scribed for Dr. Celine [name] by [name] East, RN. 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 There was a Selective/Open Wound Non-Viable Tissue Debridement with a total area of 3.9 sq cm performed by Fredirick Maudlin, MD. With the following instrument(s): Curette to remove Non-Viable tissue/material. Material removed includes [facility]. No specimens were taken. A time out was conducted at 08:16, prior to the start of the procedure. A Minimum amount of bleeding was controlled with Pressure. The procedure was tolerated well. Post Debridement Measurements: 2.6cm length x 1.5cm width x 0.1cm depth; 0.306cm^3 volume. Character of Wound/Ulcer Post Debridement requires further debridement. Severity of Tissue Post Debridement is: Fat layer exposed. Post procedure Diagnosis Wound #2: Same as Pre-Procedure General Notes: Scribed for Dr. Celine [name] by [name] East, RN. Plan Follow-up Appointments: Return Appointment in 1 week. - Dr. Alvera Singh Bathing/ Shower/ Hygiene: May shower with protection but do not get  wound dressing(s) wet. Edema Control - Lymphedema / SCD / Other: Elevate legs to the level of the heart or above for 30 minutes daily and/or when sitting, a frequency of: Avoid standing for long periods of time. Hyperbaric Oxygen Therapy: Wound #1 Left Calcaneus: Evaluate for HBO Therapy Indication: - chronic refractory osteomyelitis If appropriate for treatment, begin HBOT per protocol: 2.5 ATA for 90 Minutes with 2 Five (5) Minute Air Breaks T Number of Treatments: - 40 otal One treatments per day (delivered Monday through Friday unless otherwise specified in Special Instructions below): Finger stick Blood Glucose Pre- and Post- HBOT Treatment. Follow Hyperbaric Oxygen Glycemia Protocol Afrin (Oxymetazoline HCL) 0.05% nasal spray - 1 spray in both nostrils daily as needed prior to HBO treatment for difficulty clearing ears WOUND #1: - Calcaneus Wound Laterality: Left Cleanser: Soap and Water 1 x Per Day/30 Days Discharge Instructions: May shower and wash wound with dial antibacterial soap and water prior to dressing change. Peri-Wound Care: Sween Lotion (Moisturizing lotion) 1 x Per Day/30 Days Discharge Instructions: Apply moisturizing lotion as directed Topical: Gentamicin 1 x Per Day/30 Days Discharge Instructions: As directed by physician Prim Dressing: KerraCel Ag Gelling Fiber Dressing, 4x5 in (silver alginate) (Generic) 1 x Per Day/30 Days ary Discharge Instructions: Apply silver alginate to wound  bed as instructed Secondary Dressing: ABD Pad, 5x9 (Generic) 1 x Per Day/30 Days Discharge Instructions: Apply over primary dressing as directed. Secondary Dressing: Zetuvit Plus Silicone Border Dressing 5x5 (in/in) 1 x Per Day/30 Days Discharge Instructions: Apply silicone border over primary dressing as directed. Secured With: 62M Medipore Public affairs consultant Surgical T 2x10 (in/yd) 1 x Per Day/30 Days ape Discharge Instructions: Secure with tape as directed. Com pression Wrap: Kerlix Roll  4.5x3.1 (in/yd) (Generic) 1 x Per Day/30 Days Discharge Instructions: Apply Kerlix and Coban compression as directed. Com pression Wrap: Coban Self-Adherent Wrap 4x5 (in/yd) (Generic) 1 x Per Day/30 Days Discharge Instructions: Apply over Kerlix as directed. WOUND #2: - Foot Wound Laterality: Dorsal, Left Cleanser: Soap and Water 1 x Per Day/30 Days Discharge Instructions: May shower and wash wound with dial antibacterial soap and water prior to dressing change. Peri-Wound Care: Sween Lotion (Moisturizing lotion) 1 x Per Day/30 Days Discharge Instructions: Apply moisturizing lotion as directed Topical: Gentamicin 1 x Per Day/30 Days Discharge Instructions: As directed by physician Prim Dressing: KerraCel Ag Gelling Fiber Dressing, 4x5 in (silver alginate) (Generic) 1 x Per Day/30 Days ary Discharge Instructions: Apply silver alginate to wound bed as instructed Secondary Dressing: ALLEVYN Heel 4 1/2in x 5 1/2in / 10.5cm x 13.5cm (Generic) 1 x Per Day/30 Days Discharge Instructions: Apply over primary dressing as directed. Secondary Dressing: Zetuvit Plus Silicone Border Dressing 5x5 (in/in) (Generic) 1 x Per Day/30 Days Discharge Instructions: Apply silicone border over primary dressing as directed. Secured With: 62M Medipore Public affairs consultant Surgical T 2x10 (in/yd) 1 x Per Day/30 Days ape Discharge Instructions: Secure with tape as directed. Com pression Wrap: Kerlix Roll 4.5x3.1 (in/yd) (Generic) 1 x Per Day/30 Days Discharge Instructions: Apply Kerlix and Coban compression as directed. Com pression Wrap: Coban Self-Adherent Wrap 4x5 (in/yd) (Generic) 1 x Per Day/30 Days Discharge Instructions: Apply over Kerlix as directed. 11/08/2022: The dorsal foot wound is flush with the surrounding skin and has a good granulation tissue surface. There is some slough accumulation. As usual, the heel has a rim of macerated callus but the dimensions are smaller and the tunneling and undermining have contracted  further. I used a curette to debride slough from his dorsal foot wound and callus, skin, and biofilm from his heel wound. He forgot his [employer] compound today so we will apply topical gentamicin to his wounds along with silver alginate. He will resume his [employer] with his next dressing change at home. Continue hyperbaric oxygen therapy. Follow-up in 1 week. [name], [name] ([id number]) [id number] Page 10 of 12 Electronic Signature(s) Signed: 11/08/2022 8:35:57 AM By: Fredirick Maudlin MD FACS Entered By: Fredirick Maudlin on 11/08/2022 08:35:57 -------------------------------------------------------------------------------- HxROS Details Patient Name: Date of Service: [name], [name] 11/08/2022 8:00 A M Medical Record Number: [id number] Patient Account Number: [id number] Date of Birth/Sex: Treating RN: 1956-03-11 (66 y.o. M) Primary Care Provider: Nelda Bucks Other Clinician: Referring Provider: Treating Provider/Extender: Doristine Bosworth in Treatment: 8 Eyes Medical History: Positive for: Cataracts Negative for: Glaucoma; Optic Neuritis Ear/Nose/Mouth/Throat Medical History: Negative for: Chronic sinus problems/congestion; Middle ear problems Respiratory Medical History: Positive for: Sleep Apnea Cardiovascular Medical History: Positive for: Hypertension Past Medical History Notes: hyperlipidemia Gastrointestinal Medical History: Negative for: Cirrhosis ; Colitis; Crohns; Hepatitis A; Hepatitis B; Hepatitis C Endocrine Medical History: Positive for: Type II Diabetes Time with diabetes: 20 years Treated with: Insulin Blood sugar tested every day: Yes Tested : 2-3 Genitourinary Medical History: Past Medical History Notes: AKI Immunological  Medical History: Negative for: Lupus Erythematosus; Raynauds; Scleroderma Musculoskeletal Medical History: Positive for: Osteomyelitis - 2023 Neurologic Medical  History: Positive for: Neuropathy - Bila lower extremities KAMAAL, CAST ([id number]) [id number] Page 11 of 12 Oncologic Medical History: Negative for: Received Chemotherapy; Received Radiation Psychiatric Medical History: Negative for: Anorexia/bulimia; Confinement Anxiety HBO Extended History Items Eyes: Cataracts Immunizations Pneumococcal Vaccine: Received Pneumococcal Vaccination: Yes Received Pneumococcal Vaccination On or After 60th Birthday: Yes Implantable Devices Yes Hospitalization / Surgery History Type of Hospitalization/Surgery I and D Left calcaneus back surgery- laminectomy eye surgery- Bila cataracts shoulder arthroscopy Family and Social History Cancer: Yes - Father,Mother; Diabetes: Yes - Mother,Father; Heart Disease: No; Hereditary Spherocytosis: No; Hypertension: No; Kidney Disease: No; Lung Disease: Yes - Father; Seizures: No; Stroke: No; Thyroid Problems: Yes - Mother; Tuberculosis: No; Never smoker; Marital Status - Married; Alcohol Use: Never; Drug Use: No History; Caffeine Use: Daily; Financial Concerns: No; Food, Clothing or Shelter Needs: No; Support System Lacking: No; Transportation Concerns: No Electronic Signature(s) Signed: 11/08/2022 11:13:19 AM By: Fredirick Maudlin MD FACS Entered By: Fredirick Maudlin on 11/08/2022 08:34:39 -------------------------------------------------------------------------------- SuperBill Details Patient Name: Date of Service: [name], [name] 11/08/2022 Medical Record Number: [id number] Patient Account Number: [id number] Date of Birth/Sex: Treating RN: 12-Oct-1956 (66 y.o. 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.523 Non-pressure  chronic ulcer of other part of left foot with necrosis of 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, left ankle and foot E11.65 Type 2 diabetes mellitus with hyperglycemia E11.621 Type 2 diabetes mellitus with foot ulcer Facility Procedures : RAIDON, SWANNER Code: [id number] Daiva Eves ([id number] Description: [phone] - DEBRIDE WOUND 1ST 20 SQ CM OR < ICD-10 Diagnosis Description ) [phone] 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 Modifier: [id number] Quantity: 4 2876.pdf Page 12 of 12 : CPT4 Code: [id number] I Description: 7598 - DEBRIDE WOUND EA ADDL 20 SQ CM CD-10 Diagnosis Description 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 Modifier: Quantity: 1 Physician Procedures : CPT4 Code Description Modifier [id number] - WC PHYS LEVEL 4 - EST PT 25 ICD-10 Diagnosis Description 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, 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 Quantity: 1 : [id number] - WC PHYS DEBR WO ANESTH 20 SQ CM ICD-10 Diagnosis Description 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

## 2022-11-08 NOTE — Progress Notes (Signed)
KENAI, FLUEGEL R ([id number]) [id number] Page 1 of 11 Visit Report for 11/08/2022 Arrival Information Details Patient Name: Date of Service: [place], [name] 11/08/2022 8:00 A M Medical Record Number: [id number] Patient Account Number: [id number] Date of Birth/Sex: Treating RN: 01-01-1956 (66 y.o. Dwaine Deter, Jamie Primary Care Amel Kitch: Foye [name] Other Clinician: Referring Audrina Marten: Treating Jamarien Rodkey/Extender: Kandis Cocking in Treatment: 8 Visit Information History Since Last Visit Added or deleted any medications: No Patient Arrived: Knee Scooter Any new allergies or adverse reactions: No Arrival Time: 07:59 Had a fall or experienced change in No Accompanied By: self activities of daily living that may affect Transfer Assistance: None risk of falls: Patient Identification Verified: Yes Signs or symptoms of abuse/neglect since last visito No Secondary Verification Process Completed: Yes Hospitalized since last visit: No Patient Requires Transmission-Based Precautions: No Implantable device outside of the clinic excluding No Patient Has Alerts: No cellular tissue based products placed in the center since last visit: Has Dressing in Place as Prescribed: Yes Pain Present Now: No Electronic Signature(s) Signed: 11/08/2022 4:55:50 PM By: Tommie Ard RN Entered By: Tommie Ard on 11/08/2022 07:59:30 -------------------------------------------------------------------------------- Encounter Discharge Information Details Patient Name: Date of Service: [place], [name] 11/08/2022 8:00 A M Medical Record Number: [id number] Patient Account Number: [id number] Date of Birth/Sex: Treating RN: [birth year] (66 y.o. [name] Primary Care Anthonella Klausner: Foye [name] Other Clinician: Referring Hilmer Aliberti: Treating Luwanda Starr/Extender: Kandis Cocking in Treatment: 8 Encounter Discharge Information  Items Post Procedure Vitals Discharge Condition: Stable Temperature (F): 98.7 Ambulatory Status: Knee Scooter Pulse (bpm): 82 Discharge Destination: Home Respiratory Rate (breaths/min): 16 Transportation: Private Auto Blood Pressure (mmHg): 125/66 Accompanied By: self Schedule Follow-up Appointment: Yes Clinical Summary of Care: Electronic Signature(s) Signed: 11/08/2022 4:55:50 PM By: Tommie Ard RN Entered By: Tommie Ard on 11/08/2022 08:32:34 [name] ([id number]) [id number] Page 2 of 11 -------------------------------------------------------------------------------- General Visit Notes Details Patient Name: Date of Service: [place], [name] 11/08/2022 8:00 A M Medical Record Number: [id number] Patient Account Number: [id number] Date of Birth/Sex: Treating RN: 08-11-1956 (66 y.o. M) Primary Care Yi Haugan: Foye [name] Other Clinician: Referring Josuel Koeppen: Treating Monte Zinni/Extender: Kandis Cocking in Treatment: 8 Notes Patient had wound care encounter prior to planned hyperbaric treatment. Vitals taken 0857: BP: 124/69, Pulse: 82, Respirations: 18, Blood Glucose: 121 mg/dL. Patient given 8 oz Glucerna (finished 0905) and retested 30 minutes later per protocol. Secondary Blood Glucose (7948): 128 mg/dL. Patient cleared for HBOT. Patient was worried based on his last treatment and blood glucose questions and decided to forego treatment today. Dr. Mikey Bussing was covering HBO patients and was made aware. Electronic Signature(s) Signed: 11/10/2022 10:26:49 AM By: Haywood Pao CHT EMT BS , , Entered By: Haywood Pao on 11/10/2022 10:26:49 -------------------------------------------------------------------------------- Lower Extremity Assessment Details Patient Name: Date of Service: [place], [name] 11/08/2022 8:00 A M Medical Record Number: [id number] Patient Account Number: [id number] Date of Birth/Sex:  Treating RN: [birth year] (66 y.o. [name] Primary Care Darran Gabay: Foye [name] Other Clinician: Referring Adrienne Delay: Treating Danarius Mcconathy/Extender: Kandis Cocking in Treatment: 8 Edema Assessment Assessed: Kyra Searles: No] [Right: No] [Left: Edema] [Right: :] Calf Left: Right: Point of Measurement: From Medial Instep 45 cm Ankle Left: Right: Point of Measurement: From Medial Instep 25.5 cm Vascular Assessment Pulses: Dorsalis Pedis Palpable: [Left:Yes] Electronic Signature(s) Signed: 11/08/2022 4:55:50 PM By: Tommie Ard RN Entered By: Tommie Ard on 11/08/2022 08:04:51 [name] ([id number]) [id number] Page 3  of 11 -------------------------------------------------------------------------------- Multi Wound Chart Details Patient Name: Date of Service: [place], [name] 11/08/2022 8:00 A M Medical Record Number: [id number] Patient Account Number: [id number] Date of Birth/Sex: Treating RN: [birth year] (66 y.o. M) Primary Care Jveon Pound: Nelda Bucks Other Clinician: Referring Yamira Papa: Treating Adisen Bennion/Extender: Doristine Bosworth in Treatment: 8 Vital Signs Height(in): 65 Capillary Blood Glucose(mg/dl): 202 Weight(lbs): 245 Pulse(bpm): 77 Body Mass Index(BMI): 31.5 Blood Pressure(mmHg): 125/61 Temperature(F): 98.7 Respiratory Rate(breaths/min): 18 [1:Photos:] [N/A:N/A] Left Calcaneus Left, Dorsal Foot N/A Wound Location: Pressure Injury Pressure Injury N/A Wounding Event: Diabetic Wound/Ulcer of the Lower Diabetic Wound/Ulcer of the Lower N/A Primary Etiology: Extremity Extremity Cataracts, Sleep Apnea, Hypertension, Cataracts, Sleep Apnea, Hypertension, N/A Comorbid History: Type II Diabetes, Osteomyelitis, Type II Diabetes, Osteomyelitis, Neuropathy Neuropathy 03/09/2019 07/09/2022 N/A Date Acquired: 8 8 N/A Weeks of Treatment: Open Open N/A Wound Status: No No  N/A Wound Recurrence: 5.7x5x0.3 2.6x1.5x0.1 N/A Measurements L x W x D (cm) 22.384 3.063 N/A A (cm) : rea 6.715 0.306 N/A Volume (cm) : -3.60% 67.50% N/A % Reduction in A rea: 68.90% 67.50% N/A % Reduction in Volume: 4 Starting Position 1 (o'clock): 6 Ending Position 1 (o'clock): 1.5 Maximum Distance 1 (cm): Yes No N/A Undermining: Grade 3 Grade 2 N/A Classification: Medium Medium N/A Exudate A mount: Serosanguineous Serosanguineous N/A Exudate Type: red, brown red, brown N/A Exudate Color: Fibrotic scar, thickened scar Flat and Intact N/A Wound Margin: Medium (34-66%) Medium (34-66%) N/A Granulation A mount: Red, Pink Pink N/A Granulation Quality: Medium (34-66%) Medium (34-66%) N/A Necrotic A mount: Eschar, Adherent Slough Eschar, Adherent Slough N/A Necrotic Tissue: Fat Layer (Subcutaneous Tissue): Yes Fat Layer (Subcutaneous Tissue): Yes N/A Exposed Structures: Fascia: No Fascia: No Tendon: No Tendon: No Muscle: No Muscle: No Joint: No Joint: No Bone: No Bone: No Small (1-33%) Medium (34-66%) N/A Epithelialization: Debridement - Selective/Open Wound Debridement - Selective/Open Wound N/A Debridement: Pre-procedure Verification/Time Out 08:16 08:16 N/A Taken: Callus Slough N/A Tissue Debrided: Skin/Epidermis Non-Viable Tissue N/A Level: 28.5 3.9 N/A Debridement A (sq cm): rea Curette Curette N/A Instrument: Minimum Minimum N/A Bleeding: Pressure Pressure N/A Hemostasis A chieved: Procedure was tolerated well Procedure was tolerated well N/A Debridement Treatment Response: 5.7x5x0.3 2.6x1.5x0.1 N/A Post Debridement Measurements L x DAISHAWN, SALAMA ([id number]) [id number] Page 4 of 11 W x D (cm) 6.715 0.306 N/A Post Debridement Volume: (cm) Callus: Yes No Abnormalities Noted N/A Periwound Skin Texture: Maceration: Yes Maceration: Yes N/A Periwound Skin Moisture: Dry/Scaly: No No Abnormalities Noted  Erythema: Yes N/A Periwound Skin Color: No Abnormality No Abnormality N/A Temperature: Debridement Debridement N/A Procedures Performed: Treatment Notes Wound #1 (Calcaneus) Wound Laterality: Left Cleanser Soap and Water Discharge Instruction: May shower and wash wound with dial antibacterial soap and water prior to dressing change. Peri-Wound Care Sween Lotion (Moisturizing lotion) Discharge Instruction: Apply moisturizing lotion as directed Topical Gentamicin Discharge Instruction: As directed by physician Primary Dressing KerraCel Ag Gelling Fiber Dressing, 4x5 in (silver alginate) Discharge Instruction: Apply silver alginate to wound bed as instructed Secondary Dressing ABD Pad, 5x9 Discharge Instruction: Apply over primary dressing as directed. Zetuvit Plus Silicone Border Dressing 5x5 (in/in) Discharge Instruction: Apply silicone border over primary dressing as directed. Secured With [employer] Surgical T 2x10 (in/yd) ape Discharge Instruction: Secure with tape as directed. Compression Wrap Kerlix Roll 4.5x3.1 (in/yd) Discharge Instruction: Apply Kerlix and Coban compression as directed. Coban Self-Adherent Wrap 4x5 (in/yd) Discharge Instruction: Apply over Kerlix as directed. Compression Stockings Add-Ons Wound #2 (Foot) Wound Laterality:  Dorsal, Left Cleanser Soap and Water Discharge Instruction: May shower and wash wound with dial antibacterial soap and water prior to dressing change. Peri-Wound Care Sween Lotion (Moisturizing lotion) Discharge Instruction: Apply moisturizing lotion as directed Topical Gentamicin Discharge Instruction: As directed by physician Primary Dressing KerraCel Ag Gelling Fiber Dressing, 4x5 in (silver alginate) Discharge Instruction: Apply silver alginate to wound bed as instructed Secondary Dressing ALLEVYN Heel 4 1/2in x 5 1/2in / 10.5cm x 13.5cm Discharge Instruction: Apply over primary dressing as directed. Zetuvit  Plus Silicone Border Dressing 5x5 (in/in) Discharge Instruction: Apply silicone border over primary dressing as directed. Secured With HAI, MONFILS ([id number]) [id number] Page 5 of 11 96M Medipore Soft Cloth Surgical T 2x10 (in/yd) ape Discharge Instruction: Secure with tape as directed. Compression Wrap Kerlix Roll 4.5x3.1 (in/yd) Discharge Instruction: Apply Kerlix and Coban compression as directed. Coban Self-Adherent Wrap 4x5 (in/yd) Discharge Instruction: Apply over Kerlix as directed. Compression Stockings Add-Ons Electronic Signature(s) Signed: 11/08/2022 8:33:03 AM By: Fredirick Maudlin MD FACS Entered By: Fredirick Maudlin on 11/08/2022 08:33:02 -------------------------------------------------------------------------------- Multi-Disciplinary Care Plan Details Patient Name: Date of Service: [place], [name] 11/08/2022 8:00 A M Medical Record Number: [id number] Patient Account Number: [id number] Date of Birth/Sex: Treating RN: 1956-08-27 (66 y.o. 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 and treatment procedures Potential for barotraumas to ears, sinuses, teeth, and lungs or cerebral gas embolism related to changes in atmospheric pressure inside hyperbaric oxygen chamber Potential for oxygen toxicity seizures related to delivery of 100% oxygen at an increased atmospheric pressure Potential for pulmonary oxygen toxicity related to delivery of 100% oxygen at an increased atmospheric pressure Goals: Barotrauma will be prevented during HBO2 Date Initiated: 10/06/2022 Target Resolution Date: 11/03/2022 Goal Status: Active Patient and/or family will be able  to state/discuss factors appropriate to the management of their disease process during treatment Date Initiated: 10/06/2022 Target Resolution Date: 11/03/2022 Goal Status: Active Patient will tolerate the hyperbaric oxygen therapy treatment Date Initiated: 10/06/2022 Target Resolution Date: 11/03/2022 Goal Status: Active Patient/caregiver will verbalize understanding of HBO goals, rationale, procedures and potential hazards Date Initiated: 10/06/2022 Target Resolution Date: 11/03/2022 Goal Status: Active Interventions: Administer decongestants, per physician orders, prior to HBO2 Administer the correct therapeutic gas delivery based on the patients needs and limitations, per physician order Assess and provide for patients comfort related to the hyperbaric environment and equalization of middle ear Assess for signs and symptoms related to adverse events, including but not limited to confinement anxiety, pneumothorax, oxygen toxicity and baurotrauma Notes: Wound/Skin Impairment Nursing Diagnoses: FRANKLIN, BRESSAN ([id number]) [id number] Page 6 of 11 Impaired tissue integrity Goals: Patient/caregiver will verbalize understanding of skin care regimen Date Initiated: 10/06/2022 Target Resolution Date: 11/03/2022 Goal Status: Active Ulcer/skin breakdown will have a volume reduction of 30% by week 4 Date Initiated: 09/10/2022 Date Inactivated: 10/06/2022 Target Resolution Date: 10/08/2022 Goal Status: Unmet Unmet Reason: osteo, HBOT Ulcer/skin breakdown will have a volume reduction of 50% by week 8 Date Initiated: 10/06/2022 Target Resolution Date: 11/03/2022 Goal Status: Active Interventions: Assess ulceration(s) every visit Provide education on ulcer and skin care Treatment Activities: Consult for HBO : 09/10/2022 Skin care regimen initiated : 09/10/2022 Notes: Electronic Signature(s) Signed: 11/08/2022 4:55:50 PM By: Blanche East RN Entered By: Blanche East on 11/08/2022 08:11:37 -------------------------------------------------------------------------------- Pain Assessment Details Patient Name:  Date of Service: [place], [name] 11/08/2022 8:00 A M Medical Record Number: [id number] Patient Account Number: [id number] Date of Birth/Sex: Treating RN: Apr 23, 1956 (66 y.o. [name] Primary Care Sadee Osland: Nelda Bucks Other Clinician: Referring Courtenay Creger: Treating Anuoluwapo Mefferd/Extender: Doristine Bosworth in Treatment: 8 Active Problems Location of Pain Severity and Description of Pain Patient Has Paino No Site Locations Rate the pain. Current Pain Level: 0 Pain Management and Medication Current Pain Management: Electronic Signature(s) Signed: 11/08/2022 4:55:50 PM By: Blanche East RN Entered By: Blanche East on 11/08/2022 08:00:18 WAVIE, PINSKER R ([id number]) [id number] Page 7 of 11 -------------------------------------------------------------------------------- Patient/Caregiver Education Details Patient Name: Date of Service: [place], [name] 11/27/2023andnbsp8:00 A M Medical Record Number: [id number] Patient Account Number: [id number] Date of Birth/Gender: Treating RN: Nov 06, 1956 (66 y.o. [name] Primary Care Physician: Nelda Bucks Other Clinician: Referring Physician: Treating Physician/Extender: Doristine Bosworth in Treatment: 8 Education Assessment Education Provided To: Patient Education Topics Provided Wound/Skin Impairment: Methods: Explain/Verbal Responses: Reinforcements needed, State content correctly Electronic Signature(s) Signed: 11/08/2022 4:55:50 PM By: Blanche East RN Entered By: Blanche East on 11/08/2022 08:11:50 -------------------------------------------------------------------------------- Wound Assessment Details Patient Name: Date of Service: [place], [name] 11/08/2022 8:00 A M Medical Record  Number: [id number] Patient Account Number: [id number] Date of Birth/Sex: Treating RN: [birth year] (66 y.o. [name] Primary Care Mayfield Schoene: Nelda Bucks Other Clinician: Referring Makenze Ellett: Treating Easter Kennebrew/Extender: Doristine Bosworth in Treatment: 8 Wound Status Wound Number: 1 Primary Diabetic Wound/Ulcer of the Lower Extremity Etiology: Wound Location: Left Calcaneus Wound Open Wounding Event: Pressure Injury Status: Date Acquired: 03/09/2019 Comorbid Cataracts, Sleep Apnea, Hypertension, Type II Diabetes, Weeks Of Treatment: 8 History: Osteomyelitis, Neuropathy Clustered Wound: No Photos Wound Measurements TANOR, MARTINA R ([id number]) Length: (cm) 5.7 Width: (cm) 5 Depth: (cm) 0.3 Area: (cm) 22.384 Volume: (cm) 6.715 [id number] Page 8 of 11 % Reduction in Area: -3.6% % Reduction in Volume: 68.9% Epithelialization: Small (1-33%) Tunneling: No Undermining: Yes Starting Position (o'clock): 4 Ending Position (o'clock): 6 Maximum Distance: (cm) 1.5 Wound Description Classification: Grade 3 Wound Margin: Fibrotic scar, thickened scar Exudate Amount: Medium Exudate Type: Serosanguineous Exudate Color: red, brown Foul Odor After Cleansing: No Slough/Fibrino Yes Wound Bed Granulation Amount: Medium (34-66%) Exposed Structure Granulation Quality: Red, Pink Fascia Exposed: No Necrotic Amount: Medium (34-66%) Fat Layer (Subcutaneous Tissue) Exposed: Yes Necrotic Quality: Eschar, Adherent Slough Tendon Exposed: No Muscle Exposed: No Joint Exposed: No Bone Exposed: No Periwound Skin Texture Texture Color No Abnormalities Noted: No No Abnormalities Noted: Yes Callus: Yes Temperature / Pain Temperature: No Abnormality Moisture No Abnormalities Noted: No Dry / Scaly: No Maceration: Yes Treatment Notes Wound #1 (Calcaneus) Wound Laterality: Left Cleanser Soap and Water Discharge Instruction: May shower  and wash wound with dial antibacterial soap and water prior to dressing change. Peri-Wound Care Sween Lotion (Moisturizing lotion) Discharge Instruction: Apply moisturizing lotion as directed Topical Gentamicin Discharge Instruction: As directed by physician Primary Dressing KerraCel Ag Gelling Fiber Dressing, 4x5 in (silver alginate) Discharge Instruction: Apply silver alginate to wound bed as instructed Secondary Dressing ABD Pad, 5x9 Discharge Instruction: Apply over primary dressing as directed. Zetuvit Plus Silicone Border Dressing 5x5 (in/in) Discharge Instruction: Apply silicone border over primary dressing as directed. Secured With [employer] Surgical T 2x10 (in/yd) ape Discharge Instruction: Secure with tape as directed. Compression Wrap Kerlix Roll 4.5x3.1 (in/yd) Discharge Instruction: Apply Kerlix and Coban compression as directed. Coban Self-Adherent Wrap 4x5 (in/yd) Discharge Instruction: Apply  over Kerlix as directed. Compression Stockings RODNEY, SHAMBURG R ([id number]) [id number] Page 9 of 11 Add-Ons Electronic Signature(s) Signed: 11/08/2022 4:55:50 PM By: Blanche East RN Entered By: Blanche East on 11/08/2022 08:10:23 -------------------------------------------------------------------------------- Wound Assessment Details Patient Name: Date of Service: [place], [name] 11/08/2022 8:00 A M Medical Record Number: [id number] Patient Account Number: [id number] Date of Birth/Sex: Treating RN: 12-16-1955 (66 y.o. [name] Primary Care Stepheni Cameron: Nelda Bucks Other Clinician: Referring Everli Rother: Treating Ysidra Sopher/Extender: Doristine Bosworth in Treatment: 8 Wound Status Wound Number: 2 Primary Diabetic Wound/Ulcer of the Lower Extremity Etiology: Wound Location: Left, Dorsal Foot Wound Open Wounding Event: Pressure Injury Status: Date Acquired: 07/09/2022 Comorbid Cataracts, Sleep Apnea,  Hypertension, Type II Diabetes, Weeks Of Treatment: 8 History: Osteomyelitis, Neuropathy Clustered Wound: No Photos Wound Measurements Length: (cm) 2.6 Width: (cm) 1.5 Depth: (cm) 0.1 Area: (cm) 3.063 Volume: (cm) 0.306 % Reduction in Area: 67.5% % Reduction in Volume: 67.5% Epithelialization: Medium (34-66%) Tunneling: No Undermining: No Wound Description Classification: Grade 2 Wound Margin: Flat and Intact Exudate Amount: Medium Exudate Type: Serosanguineous Exudate Color: red, brown Foul Odor After Cleansing: No Slough/Fibrino Yes Wound Bed Granulation Amount: Medium (34-66%) Exposed Structure Granulation Quality: Pink Fascia Exposed: No Necrotic Amount: Medium (34-66%) Fat Layer (Subcutaneous Tissue) Exposed: Yes Necrotic Quality: Eschar, Adherent Slough Tendon Exposed: No Muscle Exposed: No Joint Exposed: No Bone Exposed: No Periwound Skin Texture Texture Color No Abnormalities Noted: Yes No Abnormalities Noted: Yes Moisture Temperature / Pain No Abnormalities Noted: No Temperature: No Abnormality JSUTIN, BIEKER ([id number]) [id number] Page 10 of 11 Maceration: Yes Treatment Notes Wound #2 (Foot) Wound Laterality: Dorsal, Left Cleanser Soap and Water Discharge Instruction: May shower and wash wound with dial antibacterial soap and water prior to dressing change. Peri-Wound Care Sween Lotion (Moisturizing lotion) Discharge Instruction: Apply moisturizing lotion as directed Topical Gentamicin Discharge Instruction: As directed by physician Primary Dressing KerraCel Ag Gelling Fiber Dressing, 4x5 in (silver alginate) Discharge Instruction: Apply silver alginate to wound bed as instructed Secondary Dressing ALLEVYN Heel 4 1/2in x 5 1/2in / 10.5cm x 13.5cm Discharge Instruction: Apply over primary dressing as directed. Zetuvit Plus Silicone Border Dressing 5x5 (in/in) Discharge Instruction: Apply silicone border over primary  dressing as directed. Secured With [employer] Surgical T 2x10 (in/yd) ape Discharge Instruction: Secure with tape as directed. Compression Wrap Kerlix Roll 4.5x3.1 (in/yd) Discharge Instruction: Apply Kerlix and Coban compression as directed. Coban Self-Adherent Wrap 4x5 (in/yd) Discharge Instruction: Apply over Kerlix as directed. Compression Stockings Add-Ons Electronic Signature(s) Signed: 11/08/2022 4:55:50 PM By: Blanche East RN Entered By: Blanche East on 11/08/2022 08:11:03 -------------------------------------------------------------------------------- Vitals Details Patient Name: Date of Service: [name], [name] 11/08/2022 8:00 A M Medical Record Number: [id number] Patient Account Number: [id number] Date of Birth/Sex: Treating RN: [birth year] (66 y.o. [name] Primary Care Nishat Livingston: Nelda Bucks Other Clinician: Referring Beauden Tremont: Treating Rickayla Wieland/Extender: Doristine Bosworth in Treatment: 8 Vital Signs Time Taken: 07:59 Temperature (F): 98.7 Height (in): 74 Pulse (bpm): 82 Weight (lbs): 245 Respiratory Rate (breaths/min): 18 Body Mass Index (BMI): 31.5 Blood Pressure (mmHg): 125/61 Capillary Blood Glucose (mg/dl): 202 Reference Range: 80 - 120 mg / dl Electronic Signature(s) RAYVEN, UGOLINI R ([id number]) [id number] Page 11 of 11 Signed: 11/08/2022 4:55:50 PM By: Blanche East RN Entered By: Blanche East on 11/08/2022 08:01:46

## 2022-11-09 ENCOUNTER — Encounter (HOSPITAL_BASED_OUTPATIENT_CLINIC_OR_DEPARTMENT_OTHER): Payer: Medicare Other | Admitting: Internal Medicine

## 2022-11-09 DIAGNOSIS — E11621 Type 2 diabetes mellitus with foot ulcer: Secondary | ICD-10-CM | POA: Diagnosis not present

## 2022-11-09 DIAGNOSIS — L97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: Secondary | ICD-10-CM

## 2022-11-09 DIAGNOSIS — M86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, left ankle and foot: Secondary | ICD-10-CM

## 2022-11-09 DIAGNOSIS — L97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: Secondary | ICD-10-CM

## 2022-11-09 LAB — GLUCOSE, CAPILLARY
Glucose-Capillary: 184 mg/dL — ABNORMAL HIGH (ref 70–99)
Glucose-Capillary: 129 mg/dL — ABNORMAL HIGH (ref 70–99)

## 2022-11-09 NOTE — Progress Notes (Addendum)
[name], CHABOT R ([id number]) [id number] Page 1 of 2 Visit Report for 11/09/2022 HBO Details Patient Name: Date of Service: [name], [name] 11/09/2022 10:00 A M Medical Record Number: [id number] Patient Account Number: [id number] Date of Birth/Sex: Treating RN: 03-26-1956 (66 y.o. 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 HBO Treatment Details Treatment Number: 11 Patient Type: Outpatient Chamber Type: Monoplace Chamber Serial #: [id number] Treatment Protocol: 2.0 ATA with 90 minutes oxygen, with two 5 minute air breaks Treatment Details Compression Rate Down: 1.0 psi / minute De-Compression Rate Up: 1.5 psi / minute A breaks and breathing ir Compress Tx Pressure periods Decompress Decompress Begins Reached (leave unused spaces Begins Ends blank) Chamber Pressure (ATA 1 2 2 2 2 2  --2 1 ) Clock Time (24 hr) 10:45 11:05 11:35 11:40 12:10 12:15 - - 12:45 13:02 Treatment Length: 137 (minutes) Treatment Segments: 5 Vital Signs Capillary Blood Glucose Reference Range: 80 - 120 mg / dl HBO Diabetic Blood Glucose Intervention Range: <131 mg/dl or mg/dl Time Vitals Blood Respiratory Capillary Blood Glucose Pulse Action Type: Pulse: Temperature: Taken: Pressure: Rate: Glucose (mg/dl): Meter #: Oximetry (%) Taken: Pre 10:25 128/76 81 18 98.2 184 Post 13:04 134/83 70 16 97.3 129 Treatment Response Treatment Toleration: Well Treatment Completion Status: Treatment Completed without Adverse Event Treatment Notes Afrin (Oxymetazoline HCL) 0.05% nasal spray - 1 spray in both nostrils  prior to HBO treatment. No problems noted with today treatment. Physician HBO Attestation: I certify that I supervised this HBO treatment in accordance with Medicare guidelines. A trained emergency response team is readily available per Yes hospital policies and procedures. Continue HBOT as ordered. Yes Electronic Signature(s) Signed: 11/09/2022 4:03:55 PM By: 11/11/2022 DO Previous Signature: 11/09/2022 2:22:23 PM Version By: 11/11/2022 EMT Entered By: [name] on 11/09/2022 15:11:02 11/11/2022 R (Nona [id number].pdf Page 2 of 2 -------------------------------------------------------------------------------- HBO Safety Checklist Details Patient Name: Date of Service: [place], [name] LD R. 11/09/2022 10:00 A M Medical Record Number: 11/11/2022 Patient Account Number: [id number] Date of Birth/Sex: Treating RN: Jun 12, 1956 (66 y.o. 79 Primary Care Logon [employer]: [name] Other Clinician: Foye [name] Referring Cyntha Brickman: Treating [name]/Extender: [name] in Treatment: 8 HBO Safety Checklist Items Safety Checklist Consent Form Signed Patient voided / foley secured and emptied When did you last eato 0720 Last dose of injectable or oral agent 0650 Ostomy pouch emptied and vented if applicable NA All implantable devices assessed, documented and approved NA Intravenous access site secured and place NA Valuables secured Linens and cotton and cotton/polyester blend (less than 51% polyester) Personal oil-based products / skin lotions / body lotions removed Wigs or hairpieces removed NA Smoking or tobacco materials removed Books / newspapers / magazines / loose paper removed Cologne, aftershave, perfume and deodorant removed Jewelry removed (may wrap wedding band) Make-up removed NA Hair care products removed Battery operated devices (external) removed Heating patches and chemical  warmers removed Titanium eyewear removed NA Nail polish cured greater than 10 hours NA Casting material cured greater than 10 hours NA Hearing aids removed NA Loose dentures or partials removed removed by patient Prosthetics have been removed NA Patient demonstrates correct use of air break device (if applicable) Patient concerns have been  addressed Patient grounding bracelet on and cord attached to chamber Specifics for Inpatients (complete in addition to above) Medication sheet sent with patient NA Intravenous medications needed or due during therapy sent with patient NA Drainage tubes (e.g. nasogastric tube or chest tube secured and vented) NA Endotracheal or Tracheotomy tube secured NA Cuff deflated of air and inflated with saline NA Airway suctioned NA Notes The safety checklist was done before the treatment was started. Electronic Signature(s) Signed: 11/09/2022 2:14:17 PM By: [name] EMT Entered By: [name] on 11/09/2022 14:14:16

## 2022-11-09 NOTE — Progress Notes (Signed)
[name], [name] ([id number]) [id number] Page 1 of 2 Visit Report for 11/09/2022 Arrival Information Details Patient Name: Date of Service: [place], [name]. 11/09/2022 10:00 A M Medical Record Number: [id number] Patient Account Number: [id number] Date of Birth/Sex: Treating RN: [birth year] (66 y.o. Claudia Desanctis, Lyla Son Primary Care Eugena Rhue: Foye [name] Other Clinician: [name] Referring Farrah Skoda: Treating Keigan Tafoya/Extender: [name] in Treatment: 8 Visit Information History Since Last Visit All ordered tests and consults were completed: Yes Patient Arrived: Knee Scooter Added or deleted any medications: No Arrival Time: 09:58 Any new allergies or adverse reactions: No Accompanied By: None Had a fall or experienced change in No Transfer Assistance: None activities of daily living that may affect Patient Identification Verified: Yes risk of falls: Secondary Verification Process Completed: Yes Signs or symptoms of abuse/neglect since last visito No Patient Requires Transmission-Based Precautions: No Hospitalized since last visit: No Patient Has Alerts: No Implantable device outside of the clinic excluding No cellular tissue based products placed in the center since last visit: Pain Present Now: No Electronic Signature(s) Signed: 11/09/2022 2:06:07 PM By: [name] EMT Entered By: [name] on 11/09/2022 14:06:06 -------------------------------------------------------------------------------- Encounter Discharge Information Details Patient Name: Date of Service: [name], [name]. 11/09/2022 10:00 A M Medical Record Number: [id number] Patient Account Number: [id number] Date of Birth/Sex: Treating RN: 1956/07/13 (66 y.o. [name] Primary Care Moataz Tavis: Foye [name] Other Clinician: [name] Referring Jaylon Grode: Treating Tvisha Schwoerer/Extender: [name] in  Treatment: 8 Encounter Discharge Information Items Discharge Condition: Stable Ambulatory Status: Knee Scooter Discharge Destination: Home Transportation: Private Auto Accompanied By: None Schedule Follow-up Appointment: Yes Clinical Summary of Care: Electronic Signature(s) Signed: 11/09/2022 2:23:40 PM By: [name] EMT Entered By: [name] on 11/09/2022 14:23:40 [name] ([id number]) [id number] Page 2 of 2 -------------------------------------------------------------------------------- Vitals Details Patient Name: Date of Service: [name], [name]. 11/09/2022 10:00 A M Medical Record Number: [id number] Patient Account Number: [id number] Date of Birth/Sex: Treating RN: 08-09-1956 (66 y.o. [name] Primary Care Kaiven Vester: Foye [name] Other Clinician: [name] Referring Liyla Radliff: Treating Nioma Mccubbins/Extender: [name] in Treatment: 8 Vital Signs Time Taken: 10:25 Temperature (F): 98.2 Height (in): 74 Pulse (bpm): 81 Weight (lbs): 245 Respiratory Rate (breaths/min): 18 Body Mass Index (BMI): 31.5 Blood Pressure (mmHg): 128/76 Capillary Blood Glucose (mg/dl): 341 Reference Range: 80 - 120 mg / dl Electronic Signature(s) Signed: 11/09/2022 2:12:12 PM By: [name] EMT Entered By: [name] on 11/09/2022 14:12:12

## 2022-11-09 NOTE — Progress Notes (Signed)
KOUPER, SPINELLA ([id number]) [id number] Page 1 of 2 Visit Report for 11/09/2022 Problem List Details Patient Name: Date of Service: [name], [name] [name] LD R. 11/09/2022 10:00 A M Medical Record Number: [id number] Patient Account Number: [id number] Date of Birth/Sex: Treating RN: 1956-04-30 (66 y.o. 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 11/09/2022 Medical Record Number: [id number] Patient Account Number: [id number] Date of Birth/Sex: Treating RN: Oct 15, 1956 (67 y.o. 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, left ankle and foot E11.65 Type 2 diabetes mellitus with hyperglycemia E11.621 Type 2 diabetes mellitus with foot ulcer Facility Procedures : CPT4 Code Description: [id number] G0277-(Facility Use Only) HBOT full body chamber, , ICD-10 Diagnosis Description [phone] Other chronic osteomyelitis, left ankle and foot L97.424 Non-pressure chronic ulcer of left heel and midfoot with n L97.523  Non-pressure chronic ulcer of other part of left foot with E11.621 Type 2 diabetes mellitus with foot ulcer Modifier: ecrosis of bo necrosis of Quantity: 5 ne muscle Physician Procedures : CPT4 Code Description Modifier [id number] - WC PHYS HYPERBARIC OXYGEN THERAPY ICD-10 Diagnosis Description 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

## 2022-11-10 ENCOUNTER — Encounter (HOSPITAL_BASED_OUTPATIENT_CLINIC_OR_DEPARTMENT_OTHER): Payer: Medicare Other | Admitting: General Surgery

## 2022-11-10 DIAGNOSIS — E11621 Type 2 diabetes mellitus with foot ulcer: Secondary | ICD-10-CM | POA: Diagnosis not present

## 2022-11-10 LAB — GLUCOSE, CAPILLARY
Glucose-Capillary: 177 mg/dL — ABNORMAL HIGH (ref 70–99)
Glucose-Capillary: 197 mg/dL — ABNORMAL HIGH (ref 70–99)
Glucose-Capillary: 226 mg/dL — ABNORMAL HIGH (ref 70–99)

## 2022-11-11 ENCOUNTER — Encounter (HOSPITAL_BASED_OUTPATIENT_CLINIC_OR_DEPARTMENT_OTHER): Payer: Medicare Other | Admitting: General Surgery

## 2022-11-11 DIAGNOSIS — E11621 Type 2 diabetes mellitus with foot ulcer: Secondary | ICD-10-CM | POA: Diagnosis not present

## 2022-11-11 LAB — GLUCOSE, CAPILLARY
Glucose-Capillary: 223 mg/dL — ABNORMAL HIGH (ref 70–99)
Glucose-Capillary: 110 mg/dL — ABNORMAL HIGH (ref 70–99)

## 2022-11-11 NOTE — Progress Notes (Signed)
RAMZY, CAPPELLETTI ([id number]) [id number] Page 1 of 1 Visit Report for 11/11/2022 SuperBill Details Patient Name: Date of Service: [name], [name] [name] 11/11/2022 Medical Record Number: [id number] Patient Account Number: [id number] Date of Birth/Sex: Treating RN: 12-Oct-1956 (66 y.o. 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, left ankle and foot E11.65 Type 2 diabetes mellitus with hyperglycemia E11.621 Type 2 diabetes mellitus with foot ulcer Facility Procedures CPT4 Code Description Modifier Quantity [id number] G0277-(Facility Use Only) HBOT full body chamber, , 5 ICD-10 Diagnosis Description [phone] Other chronic osteomyelitis, 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 Physician Procedures Quantity CPT4 Code Description Modifier [id number] 99183 - WC PHYS HYPERBARIC OXYGEN THERAPY 1 ICD-10 Diagnosis Description 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

## 2022-11-11 NOTE — Progress Notes (Signed)
GRANITE, GODMAN R ([id number]) [id number] Page 1 of 2 Visit Report for 11/10/2022 Arrival Information Details Patient Name: Date of Service: [place], [name] 11/10/2022 2:00 PM Medical Record Number: [id number] Patient Account Number: [id number] Date of Birth/Sex: Treating RN: 05-Aug-1956 (66 y.o. Dianna Limbo Primary Care Rolf Fells: Foye [name] Other Clinician: Haywood Pao Referring Erminio Nygard: Treating Minami Arriaga/Extender: Kandis Cocking in Treatment: 8 Visit Information History Since Last Visit All ordered tests and consults were completed: Yes Patient Arrived: Ambulatory Added or deleted any medications: No Arrival Time: 13:50 Any new allergies or adverse reactions: No Accompanied By: self Had a fall or experienced change in No Transfer Assistance: None activities of daily living that may affect Patient Identification Verified: Yes risk of falls: Secondary Verification Process Completed: Yes Signs or symptoms of abuse/neglect since last visito No Patient Requires Transmission-Based Precautions: No Hospitalized since last visit: No Patient Has Alerts: No Implantable device outside of the clinic excluding No cellular tissue based products placed in the center since last visit: Pain Present Now: No Electronic Signature(s) Signed: 11/10/2022 6:29:53 PM By: Haywood Pao CHT EMT BS , , Entered By: Haywood Pao on 11/10/2022 18:29:53 -------------------------------------------------------------------------------- Encounter Discharge Information Details Patient Name: Date of Service: [place], [name] 11/10/2022 2:00 PM Medical Record Number: [id number] Patient Account Number: [id number] Date of Birth/Sex: Treating RN: 1956/08/31 (66 y.o. Dianna Limbo Primary Care Jewelia Bocchino: Foye [name] Other Clinician: Haywood Pao Referring Alexes Menchaca: Treating Amen Dargis/Extender: Kandis Cocking in Treatment: 8 Encounter Discharge Information Items Discharge Condition: Stable Ambulatory Status: Ambulatory Discharge Destination: Home Transportation: Private Auto Accompanied By: self Schedule Follow-up Appointment: No Clinical Summary of Care: Electronic Signature(s) Signed: 11/10/2022 7:09:42 PM By: Haywood Pao CHT EMT BS , , Entered By: Haywood Pao on 11/10/2022 19:09:41 Nona Dell R ([id number]) [id number] Page 2 of 2 -------------------------------------------------------------------------------- Vitals Details Patient Name: Date of Service: [name], [name] 11/10/2022 2:00 PM Medical Record Number: [id number] Patient Account Number: [id number] Date of Birth/Sex: Treating RN: 05-05-1956 (66 y.o. Dianna Limbo Primary Care Adasyn Mcadams: Foye [name] Other Clinician: Haywood Pao Referring Eloni Darius: Treating Ashley Bultema/Extender: Kandis Cocking in Treatment: 8 Vital Signs Time Taken: 13:54 Temperature (F): 97.9 Height (in): 74 Pulse (bpm): 79 Weight (lbs): 245 Respiratory Rate (breaths/min): 18 Body Mass Index (BMI): 31.5 Blood Pressure (mmHg): 131/76 Capillary Blood Glucose (mg/dl): 106 Reference Range: 80 - 120 mg / dl Electronic Signature(s) Signed: 11/10/2022 6:30:33 PM By: Haywood Pao CHT EMT BS , , Entered By: Haywood Pao on 11/10/2022 18:30:32

## 2022-11-11 NOTE — Progress Notes (Addendum)
[name], [name] ([id number]) [id number] Page 1 of 2 Visit Report for 11/10/2022 HBO Details Patient Name: Date of Service: [name], [name]. 11/10/2022 2:00 PM Medical Record Number: [id number] Patient Account Number: [id number] Date of Birth/Sex: Treating RN: 05-03-1956 (66 y.o. 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 HBO Treatment Details Treatment Number: 12 Patient Type: Outpatient Chamber Type: Monoplace Chamber Serial #: [id number] Treatment Protocol: 2.0 ATA with 90 minutes oxygen, with two 5 minute air breaks Treatment Details Compression Rate Down: 1.0 psi / minute De-Compression Rate Up: 1.5 psi / minute A breaks and breathing ir Compress Tx Pressure periods Decompress Decompress Begins Reached (leave unused spaces Begins Ends blank) Chamber Pressure (ATA 1 2 2 2 2 2  --2 1 ) Clock Time (24 hr) 14:27 14:46 15:17 15:22 15:52 15:57 - - 16:17 16:30 Treatment Length: 123 (minutes) Treatment Segments: 4 Vital Signs Capillary Blood Glucose Reference Range: 80 - 120 mg / dl HBO Diabetic Blood Glucose Intervention Range: <131 mg/dl or mg/dl Type: Time Vitals Blood Respiratory Capillary Blood Glucose Pulse Action Pulse: Temperature: Taken: Pressure: Rate: Glucose (mg/dl): Meter #: Oximetry (%) Taken: Pre 13:54 131/76 79 18 97.9 197 1 see note Post 16:34 132/76 70 18 98.1 226 1 none per protocol Treatment Response Treatment Toleration: Well Treatment Completion Status: Treatment Completed without Adverse Event Treatment Notes Patient arrived a little stressed from the  day's events with car trouble. Stated that he wasn't sure if he should undergo treatment but wanted to know what his blood glucose was as a tool in determining his choice. Blood Glucose Level taken at 1354 resulting in 197 mg/dL. He was surprised that his blood glucose was that high. Patient dressed for treatment. Patient asked for his blood glucose level to be re-measured as he was worried that his blood glucose was dropping. He stated that he ate a boiled egg and a bagel approximately 0730. I re-measured blood glucose with a result of 177 mg/dL. Since he had his meal at 0730 and blood glucose level was decreasing, I gave patient an 8 oz Glucerna in preparation for treatment. I reminded patient that when he had a difficulty with treatment 11/03/22, his blood glucose level was 171 mg/dL post-treatment. I informed physician of his two blood glucose levels, his early breakfast, and addition of a Glucerna drink. Dr. 11/05/22 agreed and cleared patient for treatment. Patient safely placed in the chamber after completing safety check. Compression started and upon reaching 2 psig, patient remembered he wanted to take Afrin. Chamber decompressed and opened, patient self-administered Afrin (Oxymetazoline HCL) 0.05% nasal spray - 1 spray in both nostrils prior to HBO treatment. Patient safely placed in chamber. Patient tolerated compression fairly well. He seems to have some difficulty with ear equalization, therefore, rate set was at 1 psi/min until reaching approximately 12 psig at which time rate set was increased to 1.5 psi/min, total travel time 19 minutes. Patient tolerated treatment well. Chamber decompression proceeded at 1.5 psi/min. Patient denied issues with ear equalization while in the chamber. Patient stated outside the chamber that he had some fullness which he expected to resolve on it's own. Patient was stable upon discharge. [name] 11/10/2022 1900) Physician HBO Attestation: I certify that I  supervised  this HBO treatment in accordance with Medicare guidelines. A trained emergency response team is readily available per Yes [name] policies and procedures. Continue HBOT as ordered. [name], [name] ([id number]) [id number] Page 2 of 2 Electronic Signature(s) Signed: 11/11/2022 8:10:49 AM By: [name] MD FACS Previous Signature: 11/10/2022 7:08:25 PM Version By: [name] , , Previous Signature: 11/10/2022 7:06:26 PM Version By: [name] , , Previous Signature: 11/10/2022 6:56:16 PM Version By: [name] , , Entered By: Duanne [name] on 11/11/2022 08:10:49 -------------------------------------------------------------------------------- HBO Safety Checklist Details Patient Name: Date of Service: [place], [name] 11/10/2022 2:00 PM Medical Record Number: [id number] Patient Account Number: [id number] Date of Birth/Sex: Treating RN: 12-Jun-1956 (66 y.o. [name] Primary Care [name]: [name] Other Clinician: Haywood [name] Referring Deondre Marinaro: Treating [name]/Extender: [name] in Treatment: 8 HBO Safety Checklist Items Safety Checklist Consent Form Signed Patient voided / foley secured and emptied When did you last eato Approx 10AM Boiled Egg Bagel Last dose of injectable or oral agent "0830 5Units 1100-5Units fast acting" Ostomy pouch emptied and vented if applicable NA All implantable devices assessed, documented and approved NA Intravenous access site secured and place NA Valuables secured Linens and cotton and cotton/polyester blend (less than 51% polyester) Personal oil-based products / skin lotions / body lotions removed Wigs or hairpieces removed NA Smoking or tobacco materials removed NA Books / newspapers / magazines / loose paper removed Cologne, aftershave, perfume and deodorant removed Jewelry removed  (may wrap wedding band) Make-up removed Hair care products removed Battery operated devices (external) removed Heating patches and chemical warmers removed Titanium eyewear removed Nail polish cured greater than 10 hours NA Casting material cured greater than 10 hours NA Hearing aids removed NA Loose dentures or partials removed dentures removed Prosthetics have been removed NA Patient demonstrates correct use of air break device (if applicable) Patient concerns have been addressed Patient grounding bracelet on and cord attached to chamber Specifics for Inpatients (complete in addition to above) Medication sheet sent with patient NA Intravenous medications needed or due during therapy sent with patient NA Drainage tubes (e.g. nasogastric tube or chest tube secured and vented) NA Endotracheal or Tracheotomy tube secured NA Cuff deflated of air and inflated with saline NA Airway suctioned NA Notes Paper version used prior to treatment. Electronic Signature(s) Signed: 11/10/2022 6:32:45 PM By: [name] , , Previous Signature: 11/10/2022 6:32:25 PM Version By: [name] , , Entered By: [name] on 11/10/2022 18:32:45

## 2022-11-11 NOTE — Progress Notes (Signed)
[name], [name] ([id number]) [id number] Page 1 of 2 Visit Report for 11/11/2022 HBO Details Patient Name: Date of Service: [name]. 11/11/2022 10:00 A M Medical Record Number: [id number] Patient Account Number: [id number] Date of Birth/Sex: Treating RN: Jan 21, 1956 (66 y.o. 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 HBO Treatment Details Treatment Number: 13 Patient Type: Outpatient Chamber Type: Monoplace Chamber Serial #: [id number] Treatment Protocol: 2.0 ATA with 90 minutes oxygen, with two 5 minute air breaks Treatment Details Compression Rate Down: 1.0 psi / minute De-Compression Rate Up: A breaks and breathing ir Compress Tx Pressure periods Decompress Decompress Begins Reached (leave unused spaces Begins Ends blank) Chamber Pressure (ATA 1 2 2 2 2 2  --2 1 ) Clock Time (24 hr) 10:22 10:44 11:15 11:20 11:50 11:55 - - 12:25 12:47 Treatment Length: 145 (minutes) Treatment Segments: 5 Vital Signs Capillary Blood Glucose Reference Range: 80 - 120 mg / dl HBO Diabetic Blood Glucose Intervention Range: <131 mg/dl or mg/dl Time Vitals Blood Respiratory Capillary Blood Glucose Pulse Action Type: Pulse: Temperature: Taken: Pressure: Rate: Glucose (mg/dl): Meter #: Oximetry (%) Taken: Pre 10:15 120/70 77 18 98.1 Post 12:45 108/64 69 18 97.1 110 Pre 10:00 223 Treatment Response Treatment Toleration: Well Treatment Completion Status: Treatment Completed without Adverse Event Physician HBO Attestation: I certify that I supervised this HBO treatment in accordance with  Medicare guidelines. A trained emergency response team is readily available per Yes hospital policies and procedures. Continue HBOT as ordered. Yes Electronic Signature(s) Signed: 11/11/2022 3:45:42 PM By: 11/13/2022 MD FACS Previous Signature: 11/11/2022 3:14:23 PM Version By: 11/13/2022 EMT Entered By: [name] on 11/11/2022 15:45:42 Garciamartinez, [name] ([id number].pdf Page 2 of 2 -------------------------------------------------------------------------------- HBO Safety Checklist Details Patient Name: Date of Service: [place], [name]. 11/11/2022 10:00 A M Medical Record Number: 11/13/2022 Patient Account Number: [id number] Date of Birth/Sex: Treating RN: [birth year] (66 y.o. 79 Primary Care Labrea Eccleston: [name] Other Clinician: Foye [name] Referring Arantxa Piercey: Treating Marleena Shubert/Extender: [name] in Treatment: 8 HBO Safety Checklist Items Safety Checklist Consent Form Signed Patient voided / foley secured and emptied When did you last eato 0700 Last dose of injectable or oral agent 0630 Ostomy pouch emptied and vented if applicable NA All implantable devices assessed, documented and approved PE Tube in ear. Intravenous access site secured and place NA Valuables secured Linens and cotton and cotton/polyester blend (less than 51% polyester) Personal oil-based products / skin lotions / body lotions removed Wigs or hairpieces removed NA Smoking or tobacco materials removed Books / newspapers / magazines / loose paper removed Cologne, aftershave, perfume and deodorant removed Jewelry removed (may wrap wedding band) Make-up removed NA Hair care products removed Battery operated devices (external) removed Heating patches and chemical warmers removed Titanium eyewear removed NA Nail polish cured greater than 10 hours NA Casting material cured greater than 10  hours NA Hearing aids removed NA Loose dentures or partials removed removed by patient Prosthetics have been removed NA Patient demonstrates correct use of air break device (if applicable) Patient concerns have been addressed Patient grounding bracelet on and cord attached to chamber Specifics for Inpatients (complete in addition to above) Medication sheet sent with  patient NA Intravenous medications needed or due during therapy sent with patient NA Drainage tubes (e.g. nasogastric tube or chest tube secured and vented) NA Endotracheal or Tracheotomy tube secured NA Cuff deflated of air and inflated with saline NA Airway suctioned NA Notes The safety checklist was done before the treatment was started. Electronic Signature(s) Signed: 11/11/2022 2:39:37 PM By: [name] EMT Entered By: [name] on 11/11/2022 14:39:37

## 2022-11-11 NOTE — Progress Notes (Signed)
[name] [name], [name] [name] ([id number]) [id number] Page 1 of 2 Visit Report for 11/11/2022 Arrival Information Details Patient Name: Date of Service: [name] [name], [name] [name] [name] [name]. 11/11/2022 10:00 A M Medical Record Number: [id number] Patient Account Number: [id number] Date of Birth/Sex: Treating RN: Jun 12, 1956 (66 y.o. [name] Primary Care Taylinn Brabant: Foye [name] Other Clinician: [name] Referring Shareta Fishbaugh: Treating Ixel Boehning/Extender: Kandis Cocking in Treatment: 8 Visit Information History Since Last Visit All ordered tests and consults were completed: Yes Patient Arrived: Knee Scooter Added or deleted any medications: No Arrival Time: 09:58 Any new allergies or adverse reactions: No Accompanied By: None Had a fall or experienced change in No Transfer Assistance: None activities of daily living that may affect Patient Identification Verified: Yes risk of falls: Secondary Verification Process Completed: Yes Signs or symptoms of abuse/neglect since last visito No Patient Requires Transmission-Based Precautions: No Hospitalized since last visit: No Patient Has Alerts: No Implantable device outside of the clinic excluding No cellular tissue based products placed in the center since last visit: Pain Present Now: No Electronic Signature(s) Signed: 11/11/2022 2:37:09 PM By: [name] EMT Entered By: [name] on 11/11/2022 14:37:09 -------------------------------------------------------------------------------- Encounter Discharge Information Details Patient Name: Date of Service: [name] [name], [name] NA [name] [name]. 11/11/2022 10:00 A M Medical Record Number: [id number] Patient Account Number: [id number] Date of Birth/Sex: Treating RN: [birth year] (66 y.o. [name] Primary Care Ha Placeres: Foye [name] Other Clinician: [name] Referring Charlcie Prisco: Treating Ayinde Swim/Extender: Kandis Cocking in Treatment: 8 Encounter Discharge Information Items Discharge Condition: Stable Ambulatory Status: Knee Scooter Discharge Destination: Home Transportation: Private Auto Accompanied By: None Schedule Follow-up Appointment: Yes Clinical Summary of Care: Electronic Signature(s) Signed: 11/11/2022 3:15:12 PM By: [name] EMT Entered By: [name] on 11/11/2022 15:15:12 [name] [name] ([id number]) [id number] Page 2 of 2 -------------------------------------------------------------------------------- Vitals Details Patient Name: Date of Service: [name] [name], [name] [name] [name] [name]. 11/11/2022 10:00 A M Medical Record Number: [id number] Patient Account Number: [id number] Date of Birth/Sex: Treating RN: Jan 16, 1956 (66 y.o. [name] Primary Care Ravleen Ries: Foye [name] Other Clinician: [name] Referring Nyal Schachter: Treating Diella Gillingham/Extender: Kandis Cocking in Treatment: 8 Vital Signs Time Taken: 10:15 Temperature (F): 98.1 Height (in): 74 Pulse (bpm): 77 Weight (lbs): 245 Respiratory Rate (breaths/min): 18 Body Mass Index (BMI): 31.5 Blood Pressure (mmHg): 120/70 Reference Range: 80 - 120 mg / dl Electronic Signature(s) Signed: 11/11/2022 2:37:37 PM By: [name] EMT Entered By: [name] on 11/11/2022 14:37:37

## 2022-11-11 NOTE — Progress Notes (Signed)
NATHYN, LUIZ ([id number]) [id number] Page 1 of 1 Visit Report for 11/10/2022 SuperBill Details Patient Name: Date of Service: [place], [name] 11/10/2022 Medical Record Number: [id number] Patient Account Number: [id number] Date of Birth/Sex: Treating RN: Apr 24, 1956 (66 y.o. 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, left ankle and foot E11.65 Type 2 diabetes mellitus with hyperglycemia E11.621 Type 2 diabetes mellitus with foot ulcer Facility Procedures CPT4 Code Description Modifier Quantity [id number] G0277-(Facility Use Only) HBOT full body chamber, , 4 ICD-10 Diagnosis Description [phone] Other chronic osteomyelitis, 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 Physician Procedures Quantity CPT4 Code Description Modifier [id number] - WC PHYS HYPERBARIC OXYGEN THERAPY 1 ICD-10 Diagnosis Description 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, 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

## 2022-11-12 ENCOUNTER — Encounter (HOSPITAL_BASED_OUTPATIENT_CLINIC_OR_DEPARTMENT_OTHER): Payer: Medicare Other | Admitting: Internal Medicine

## 2022-11-15 ENCOUNTER — Encounter (HOSPITAL_BASED_OUTPATIENT_CLINIC_OR_DEPARTMENT_OTHER): Payer: Medicare Other | Admitting: General Surgery

## 2022-11-15 ENCOUNTER — Encounter (HOSPITAL_BASED_OUTPATIENT_CLINIC_OR_DEPARTMENT_OTHER): Payer: Medicare Other | Attending: Internal Medicine | Admitting: Internal Medicine

## 2022-11-15 DIAGNOSIS — L97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: Secondary | ICD-10-CM | POA: Diagnosis not present

## 2022-11-15 DIAGNOSIS — E1169 Type 2 diabetes mellitus with other specified complication: Secondary | ICD-10-CM | POA: Diagnosis not present

## 2022-11-15 DIAGNOSIS — E11621 Type 2 diabetes mellitus with foot ulcer: Secondary | ICD-10-CM | POA: Diagnosis present

## 2022-11-15 DIAGNOSIS — L97524 Non-pressure chronic ulcer of other part of left foot with necrosis of bone: Secondary | ICD-10-CM | POA: Insufficient documentation

## 2022-11-15 DIAGNOSIS — L97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: Secondary | ICD-10-CM | POA: Diagnosis not present

## 2022-11-15 DIAGNOSIS — E1165 Type 2 diabetes mellitus with hyperglycemia: Secondary | ICD-10-CM | POA: Diagnosis not present

## 2022-11-15 DIAGNOSIS — M86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, left ankle and foot: Secondary | ICD-10-CM | POA: Insufficient documentation

## 2022-11-15 LAB — GLUCOSE, CAPILLARY
Glucose-Capillary: 228 mg/dL — ABNORMAL HIGH (ref 70–99)
Glucose-Capillary: 190 mg/dL — ABNORMAL HIGH (ref 70–99)

## 2022-11-15 NOTE — Progress Notes (Signed)
KOLTYN, KELSAY R ([id number]) [id number] Page 1 of 2 Visit Report for 11/15/2022 Arrival Information Details Patient Name: Date of Service: [place], [name] 11/15/2022 10:00 A M Medical Record Number: [id number] Patient Account Number: [id number] Date of Birth/Sex: Treating RN: 1956-09-22 (66 y.o. Bayard Hugger, Bonita Quin Primary Care Perseus Westall: Foye [name] Other Clinician: [name] Referring Aleeha Boline: Treating Garnell Begeman/Extender: [name] in Treatment: 9 Visit Information History Since Last Visit All ordered tests and consults were completed: Yes Patient Arrived: Knee Scooter Added or deleted any medications: No Arrival Time: 09:50 Any new allergies or adverse reactions: No Accompanied By: None Had a fall or experienced change in No Transfer Assistance: None activities of daily living that may affect Patient Identification Verified: Yes risk of falls: Secondary Verification Process Completed: Yes Signs or symptoms of abuse/neglect since last visito No Patient Requires Transmission-Based Precautions: No Hospitalized since last visit: No Patient Has Alerts: No Implantable device outside of the clinic excluding No cellular tissue based products placed in the center since last visit: Pain Present Now: No Electronic Signature(s) Signed: 11/15/2022 4:27:43 PM By: [name] EMT Entered By: [name] on 11/15/2022 16:27:42 -------------------------------------------------------------------------------- Encounter Discharge Information Details Patient Name: Date of Service: [name], [name] 11/15/2022 10:00 A M Medical Record Number: [id number] Patient Account Number: [id number] Date of Birth/Sex: Treating RN: 1956/06/26 (66 y.o. [name], Lauren Primary Care Debbrah Sampedro: Foye [name] Other Clinician: [name] Referring Azalie Harbeck: Treating Tanesia Butner/Extender: [name] in  Treatment: 9 Encounter Discharge Information Items Discharge Condition: Stable Ambulatory Status: Knee Scooter Discharge Destination: Home Transportation: Private Auto Accompanied By: None Schedule Follow-up Appointment: Yes Clinical Summary of Care: Electronic Signature(s) Signed: 11/15/2022 4:38:12 PM By: [name] EMT Entered By: [name] on 11/15/2022 16:38:12 [name] ([id number]) [id number] Page 2 of 2 -------------------------------------------------------------------------------- Vitals Details Patient Name: Date of Service: [place], [name] 11/15/2022 10:00 A M Medical Record Number: [id number] Patient Account Number: [id number] Date of Birth/Sex: Treating RN: [birth year] (66 y.o. [name], Lauren Primary Care Felis Quillin: Foye [name] Other Clinician: [name] Referring Jelesa Mangini: Treating Rayden Scheper/Extender: [name] in Treatment: 9 Vital Signs Time Taken: 10:06 Temperature (F): 98.2 Height (in): 74 Pulse (bpm): 80 Weight (lbs): 245 Respiratory Rate (breaths/min): 18 Body Mass Index (BMI): 31.5 Blood Pressure (mmHg): 134/79 Capillary Blood Glucose (mg/dl): 681 Reference Range: 80 - 120 mg / dl Electronic Signature(s) Signed: 11/15/2022 4:30:22 PM By: [name] EMT Entered By: [name] on 11/15/2022 16:30:22

## 2022-11-15 NOTE — Progress Notes (Signed)
[name], [name] ([id number]) [id number] Page 1 of 2 Visit Report for 11/15/2022 HBO Details Patient Name: Date of Service: [place], [name]. 11/15/2022 10:00 A M Medical Record Number: [id number] Patient Account Number: [id number] Date of Birth/Sex: Treating RN: 08/15/1956 (66 y.o. 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 HBO Treatment Details Treatment Number: 14 Patient Type: Outpatient Chamber Type: Monoplace Chamber Serial #: [id number] Treatment Protocol: 2.0 ATA with 90 minutes oxygen, with two 5 minute air breaks Treatment Details Compression Rate Down: 1.0 psi / minute De-Compression Rate Up: 1.0 psi / minute A breaks and breathing ir Compress Tx Pressure periods Decompress Decompress Begins Reached (leave unused spaces Begins Ends blank) Chamber Pressure (ATA 1 2 2 2 2 2  --2 1 ) Clock Time (24 hr) 10:35 10:51 11:23 11:28 11:58 12:03 - - 12:33 12:51 Treatment Length: 136 (minutes) Treatment Segments: 5 Vital Signs Capillary Blood Glucose Reference Range: 80 - 120 mg / dl HBO Diabetic Blood Glucose Intervention Range: <131 mg/dl or mg/dl Time Vitals Blood Respiratory Capillary Blood Glucose Pulse Action Type: Pulse: Temperature: Taken: Pressure: Rate: Glucose (mg/dl): Meter #: Oximetry (%) Taken: Pre 10:06 134/79 80 18 98.2 228 Post 12:50 155/86 72 18 97.7 190 Treatment Response Treatment Toleration: Well Treatment Completion Status: Treatment Completed without Adverse Event Physician HBO Attestation: I certify that I supervised this HBO treatment in accordance  with Medicare guidelines. A trained emergency response team is readily available per Yes hospital policies and procedures. Continue HBOT as ordered. Yes Electronic Signature(s) Signed: 11/16/2022 12:20:03 PM By: 14/04/2022 DO Previous Signature: 11/15/2022 4:37:02 PM Version By: 14/03/2022 EMT Entered By: [name] on 11/16/2022 11:35:20 HBO Safety Checklist Details -------------------------------------------------------------------------------- 14/04/2022 ([name] [name].pdf Page 2 of 2 Patient Name: Date of Service: [place], [name] 11/15/2022 10:00 A M Medical Record Number: 14/03/2022 Patient Account Number: [id number] Date of Birth/Sex: Treating RN: 1956-08-28 (65 y.o. 79, [name] Primary Care Damen Windsor: [name] Other Clinician: Foye [name] Referring [name]: Treating [name]/Extender: [name] in Treatment: 9 HBO Safety Checklist Items Safety Checklist Consent Form Signed Patient voided / foley secured and emptied When did you last eato 0630 Last dose of injectable or oral agent 0515 Ostomy pouch emptied and vented if applicable NA All implantable devices assessed, documented and approved NA Intravenous access site secured and place NA Valuables secured Linens and cotton and cotton/polyester blend (less than 51% polyester) Personal oil-based products / skin lotions / body lotions [name] Wigs or hairpieces [name] NA Smoking or tobacco materials [name] Books / newspapers / magazines / loose paper [name] Cologne, aftershave, perfume and deodorant [name] Jewelry [name] (may wrap wedding band) Make-up [name] NA Hair care products [name] Battery operated devices (external) [name] Heating patches and chemical warmers [name] Titanium eyewear [name] NA Nail polish cured greater than 10 hours NA Casting material cured greater than 10 hours NA Hearing aids  [name] NA Loose dentures or partials [name] [name] by patient Prosthetics have been [name] NA Patient demonstrates correct use of air break device (if applicable) Patient concerns have been addressed Patient grounding bracelet on and cord attached to chamber Specifics for Inpatients (complete in addition to above) Medication sheet sent with patient NA  Intravenous medications needed or due during therapy sent with patient NA Drainage tubes (e.g. nasogastric tube or chest tube secured and vented) NA Endotracheal or Tracheotomy tube secured NA Cuff deflated of air and inflated with saline NA Airway suctioned Notes The safety checklist was done before the treatment was started. Electronic Signature(s) Signed: 11/15/2022 4:34:47 PM By: [name] EMT Entered By: [name] on 11/15/2022 16:34:47

## 2022-11-16 ENCOUNTER — Encounter (HOSPITAL_BASED_OUTPATIENT_CLINIC_OR_DEPARTMENT_OTHER): Payer: Medicare Other | Admitting: Internal Medicine

## 2022-11-16 DIAGNOSIS — E11621 Type 2 diabetes mellitus with foot ulcer: Secondary | ICD-10-CM | POA: Diagnosis not present

## 2022-11-16 DIAGNOSIS — L97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: Secondary | ICD-10-CM | POA: Diagnosis not present

## 2022-11-16 DIAGNOSIS — M86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, left ankle and foot: Secondary | ICD-10-CM

## 2022-11-16 DIAGNOSIS — L97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: Secondary | ICD-10-CM | POA: Diagnosis not present

## 2022-11-16 LAB — GLUCOSE, CAPILLARY
Glucose-Capillary: 170 mg/dL — ABNORMAL HIGH (ref 70–99)
Glucose-Capillary: 136 mg/dL — ABNORMAL HIGH (ref 70–99)

## 2022-11-16 NOTE — Progress Notes (Signed)
NUCHEM, GRATTAN R ([id number]) [id number] Page 1 of 2 Visit Report for 11/16/2022 Problem List Details Patient Name: Date of Service: [place], [name] 11/16/2022 10:00 A M Medical Record Number: [id number] Patient Account Number: [id number] Date of Birth/Sex: Treating RN: 09/26/1956 (66 y.o. [name] Primary Care Provider: Foye [name] Other Clinician: [name] Referring Provider: Treating Provider/Extender: [name] in Treatment: 9 Active Problems ICD-10 Encounter Code Description Active Date MDM Diagnosis L97.424 Non-pressure chronic ulcer of left heel and midfoot with 09/10/2022 No Yes necrosis of bone L97.523 Non-pressure chronic ulcer of other part of left foot with 09/10/2022 No Yes necrosis of 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, left ankle and foot 09/10/2022 No Yes E11.65 Type 2 diabetes mellitus with hyperglycemia 09/10/2022 No Yes E11.621 Type 2 diabetes mellitus with foot ulcer 09/10/2022 No Yes Inactive Problems Resolved Problems Electronic Signature(s) Signed: 11/16/2022 2:21:38 PM By: [name] EMT Signed: 11/16/2022 2:57:20 PM By: [name] DO Entered By: [name] on 11/16/2022 14:21:38 -------------------------------------------------------------------------------- SuperBill Details Patient Name: Date of Service: [name], [name] 11/16/2022 Medical Record Number: [id number] Patient Account Number: [id number] Date of Birth/Sex: Treating RN: 05-24-1956 (66 y.o. [name] Primary Care Provider: Foye [name] Other Clinician: [name] Referring Provider: Treating Provider/Extender: [name] in Treatment: [address] 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.523  Non-pressure chronic ulcer of other part of left foot with necrosis of 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, left ankle and foot E11.65 Type 2 diabetes mellitus with hyperglycemia E11.621 Type 2 diabetes mellitus with foot ulcer Facility Procedures : CPT4 Code Description: [id number] G0277-(Facility Use Only) HBOT full body chamber, , ICD-10 Diagnosis Description [phone] Other chronic osteomyelitis, left ankle and foot L97.424 Non-pressure chronic ulcer of left heel and midfoot with n L97.523  Non-pressure chronic ulcer of other part of left foot with E11.621 Type 2 diabetes mellitus with foot ulcer Modifier: ecrosis of bo necrosis of Quantity: 4 ne muscle Physician Procedures : CPT4 Code Description Modifier [id number] 99183 - WC PHYS HYPERBARIC OXYGEN THERAPY ICD-10 Diagnosis Description 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, left ankle and foot L97.424 Non-pressure chronic ulcer of left heel and midfoot with necrosis of bo  L97.523 Non-pressure chronic ulcer of other part of left foot with necrosis of E11.621 Type 2 diabetes mellitus with foot ulcer Quantity: 1 ne muscle Electronic Signature(s) Signed: 11/16/2022 2:19:31 PM By: [name] EMT Signed: 11/16/2022 2:57:20 PM By: [name] DO Entered By: [name] on 11/16/2022 14:19:31

## 2022-11-16 NOTE — Progress Notes (Addendum)
ZEPHYR, RIDLEY R ([id number]) [id number] Page 1 of 2 Visit Report for 11/16/2022 HBO Details Patient Name: Date of Service: [name], [name] 11/16/2022 10:00 A M Medical Record Number: [id number] Patient Account Number: [id number] Date of Birth/Sex: Treating RN: 11-05-1956 (66 y.o. 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 HBO Treatment Details Treatment Number: 15 Patient Type: Outpatient Chamber Type: Monoplace Chamber Serial #: [id number] Treatment Protocol: 2.0 ATA with 90 minutes oxygen, with two 5 minute air breaks Treatment Details Compression Rate Down: 1.0 psi / minute De-Compression Rate Up: 2.0 psi / minute A breaks and breathing ir Compress Tx Pressure periods Decompress Decompress Begins Reached (leave unused spaces Begins Ends blank) Chamber Pressure (ATA 1 2 2 2 2 2  --2 1 ) Clock Time (24 hr) 10:48 11:06 11:36 11:42 12:12 12:47 - - 12:47 13:01 Treatment Length: 133 (minutes) Treatment Segments: 4 Vital Signs Capillary Blood Glucose Reference Range: 80 - 120 mg / dl HBO Diabetic Blood Glucose Intervention Range: <131 mg/dl or mg/dl Time Vitals Blood Respiratory Capillary Blood Glucose Pulse Action Type: Pulse: Temperature: Taken: Pressure: Rate: Glucose (mg/dl): Meter #: Oximetry (%) Taken: Pre 10:38 142/80 75 18 97.9 Post 13:08 143/74 51 18 97.4 136 Pre 10:22 170 Treatment Response Treatment Toleration: Well Treatment Completion Status: Treatment Completed without Adverse Event Physician HBO Attestation: I certify that I supervised this HBO treatment in  accordance with Medicare guidelines. A trained emergency response team is readily available per Yes hospital policies and procedures. Continue HBOT as ordered. Yes Electronic Signature(s) Signed: 11/17/2022 12:34:39 PM By: 14/05/2022 DO Previous Signature: 11/16/2022 2:18:17 PM Version By: 14/04/2022 EMT Previous Signature: 11/16/2022 2:57:20 PM Version By: 14/04/2022 DO Entered By: [name] on 11/17/2022 12:31:45 14/05/2022 R (Nona [id number] Page 2 of 2 -------------------------------------------------------------------------------- HBO Safety Checklist Details Patient Name: Date of Service: [place], [name] LD R. 11/16/2022 10:00 A M Medical Record Number: 14/04/2022 Patient Account Number: [id number] Date of Birth/Sex: Treating RN: September 24, 1956 (66 y.o. 79 Primary Care Delmon Andrada: [name] Other Clinician: Foye [name] Referring Desteny Freeman: Treating Chrysa Rampy/Extender: [name] in Treatment: 9 HBO Safety Checklist Items Safety Checklist Consent Form Signed Patient voided / foley secured and emptied When did you last eato 0645 Last dose of injectable or oral agent 0600 Ostomy pouch emptied and vented if applicable NA All implantable devices assessed, documented and approved NA Intravenous access site secured and place NA Valuables secured Linens and cotton and cotton/polyester blend (less than 51% polyester) Personal oil-based products / skin lotions / body lotions removed Wigs or hairpieces removed NA Smoking or tobacco materials removed Books / newspapers / magazines / loose paper removed Cologne, aftershave, perfume and deodorant removed Jewelry removed (may wrap wedding band) Make-up removed NA Hair care products removed Battery operated devices (external) removed Heating patches and chemical warmers removed Titanium eyewear removed NA Nail polish cured greater than  10 hours NA Casting material cured greater than 10 hours NA Hearing aids removed NA Loose dentures or partials removed removed by patient Prosthetics have been removed NA Patient demonstrates correct use of air break device (if applicable) Patient concerns have been addressed Patient grounding bracelet on and cord attached to chamber Specifics for  Inpatients (complete in addition to above) Medication sheet sent with patient NA Intravenous medications needed or due during therapy sent with patient NA Drainage tubes (e.g. nasogastric tube or chest tube secured and vented) NA Endotracheal or Tracheotomy tube secured NA Cuff deflated of air and inflated with saline NA Airway suctioned NA Notes The safety checklist was done before the treatment was started. Electronic Signature(s) Signed: 11/16/2022 2:15:20 PM By: [name] EMT Entered By: [name] on 11/16/2022 14:15:19

## 2022-11-16 NOTE — Progress Notes (Signed)
DRAXTON, LUU R ([id number]) [id number] Page 1 of 2 Visit Report for 11/16/2022 Arrival Information Details Patient Name: Date of Service: Eugenio [place], [name] 11/16/2022 10:00 A M Medical Record Number: [id number] Patient Account Number: [id number] Date of Birth/Sex: Treating RN: 09-Apr-1956 (66 y.o. Dwaine Deter, Jamie Primary Care Lyriq Finerty: Foye [name] Other Clinician: [name] Referring Quentina Fronek: Treating Charlaine Utsey/Extender: [name] in Treatment: 9 Visit Information History Since Last Visit All ordered tests and consults were completed: Yes Patient Arrived: Knee Scooter Added or deleted any medications: No Arrival Time: 10:05 Any new allergies or adverse reactions: No Accompanied By: None Had a fall or experienced change in No Transfer Assistance: None activities of daily living that may affect Patient Identification Verified: Yes risk of falls: Secondary Verification Process Completed: Yes Signs or symptoms of abuse/neglect since last visito No Patient Requires Transmission-Based Precautions: No Hospitalized since last visit: No Patient Has Alerts: No Implantable device outside of the clinic excluding No cellular tissue based products placed in the center since last visit: Pain Present Now: No Electronic Signature(s) Signed: 11/16/2022 2:13:11 PM By: [name] EMT Entered By: [name] on 11/16/2022 14:13:11 -------------------------------------------------------------------------------- Encounter Discharge Information Details Patient Name: Date of Service: [name], [name] 11/16/2022 10:00 A M Medical Record Number: [id number] Patient Account Number: [id number] Date of Birth/Sex: Treating RN: [birth year] (66 y.o. [name] Primary Care Jadden Yim: Foye [name] Other Clinician: [name] Referring Jezlyn Westerfield: Treating Taron Mondor/Extender: [name] in  Treatment: 9 Encounter Discharge Information Items Discharge Condition: Stable Ambulatory Status: Knee Scooter Discharge Destination: Home Transportation: Private Auto Accompanied By: None Schedule Follow-up Appointment: Yes Clinical Summary of Care: Electronic Signature(s) Signed: 11/16/2022 2:22:48 PM By: [name] EMT Entered By: [name] on 11/16/2022 14:22:48 [name] ([id number]) [id number] Page 2 of 2 -------------------------------------------------------------------------------- Vitals Details Patient Name: Date of Service: [place], [name] 11/16/2022 10:00 A M Medical Record Number: [id number] Patient Account Number: [id number] Date of Birth/Sex: Treating RN: 12-May-1956 (66 y.o. [name] Primary Care Derick Seminara: Foye [name] Other Clinician: [name] Referring [place]: Treating Tarin Navarez/Extender: [name] in Treatment: 9 Vital Signs Time Taken: 10:38 Temperature (F): 97.9 Height (in): 74 Pulse (bpm): 75 Weight (lbs): 245 Respiratory Rate (breaths/min): 18 Body Mass Index (BMI): 31.5 Blood Pressure (mmHg): 142/80 Reference Range: 80 - 120 mg / dl Electronic Signature(s) Signed: 11/16/2022 2:13:55 PM By: [name] EMT Entered By: [name] on 11/16/2022 14:13:55

## 2022-11-16 NOTE — Progress Notes (Signed)
OLUWADEMILADE, MCKIVER R ([id number]) [id number] Page 1 of 2 Visit Report for 11/15/2022 Problem List Details Patient Name: Date of Service: [place] [facility] [place], [name] 11/15/2022 10:00 A M Medical Record Number: [id number] Patient Account Number: [id number] Date of Birth/Sex: Treating RN: 04-13-1956 (66 y.o. 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 11/15/2022 Medical Record Number: [id number] Patient Account Number: [id number] Date of Birth/Sex: Treating RN: [birth year] (66 y.o. [name], Lauren Primary Care Provider: Foye [name] Other Clinician: [name] Referring Provider: Treating Provider/Extender: [name] in Treatment: [address] 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, left ankle and foot E11.65 Type 2 diabetes mellitus with hyperglycemia E11.621 Type 2 diabetes mellitus with foot ulcer Facility Procedures : CPT4 Code Description: [id number] G0277-(Facility Use Only) HBOT full body chamber, , ICD-10 Diagnosis Description [phone] Other chronic osteomyelitis, left ankle and foot L97.424 Non-pressure chronic ulcer of left heel and midfoot with n L97.523  Non-pressure chronic ulcer of other part of left foot with E11.621 Type 2 diabetes mellitus with foot ulcer Modifier: ecrosis of bo necrosis of Quantity: 5 ne muscle Physician Procedures : CPT4 Code Description Modifier [id number] - WC PHYS HYPERBARIC OXYGEN THERAPY ICD-10 Diagnosis Description 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

## 2022-11-17 ENCOUNTER — Encounter (HOSPITAL_BASED_OUTPATIENT_CLINIC_OR_DEPARTMENT_OTHER): Payer: Medicare Other | Admitting: General Surgery

## 2022-11-17 DIAGNOSIS — E11621 Type 2 diabetes mellitus with foot ulcer: Secondary | ICD-10-CM | POA: Diagnosis not present

## 2022-11-17 LAB — GLUCOSE, CAPILLARY
Glucose-Capillary: 214 mg/dL — ABNORMAL HIGH (ref 70–99)
Glucose-Capillary: 276 mg/dL — ABNORMAL HIGH (ref 70–99)
Glucose-Capillary: 241 mg/dL — ABNORMAL HIGH (ref 70–99)

## 2022-11-17 NOTE — Progress Notes (Signed)
DADE, RODIN R ([id number]) [id number] Page 1 of 2 Visit Report for 11/17/2022 Arrival Information Details Patient Name: Date of Service: [place], [name] 11/17/2022 10:00 A M Medical Record Number: [id number] Patient Account Number: [id number] Date of Birth/Sex: Treating RN: May 14, 1956 (66 y.o. [name], [name] Primary Care Drystan Reader: Foye [name] Other Clinician: [name] Referring Aedyn Kempfer: Treating Tyris Eliot/Extender: Kandis Cocking in Treatment: 9 Visit Information History Since Last Visit All ordered tests and consults were completed: Yes Patient Arrived: Knee Scooter Added or deleted any medications: No Arrival Time: 10:04 Any new allergies or adverse reactions: No Accompanied By: None Had a fall or experienced change in No Transfer Assistance: None activities of daily living that may affect Patient Identification Verified: Yes risk of falls: Secondary Verification Process Completed: Yes Signs or symptoms of abuse/neglect since last visito No Patient Requires Transmission-Based Precautions: No Hospitalized since last visit: No Patient Has Alerts: No Implantable device outside of the clinic excluding No cellular tissue based products placed in the center since last visit: Pain Present Now: No Electronic Signature(s) Signed: 11/17/2022 2:37:04 PM By: [name] EMT Previous Signature: 11/17/2022 2:21:33 PM Version By: [name] EMT Entered By: [name] on 11/17/2022 14:37:04 -------------------------------------------------------------------------------- Encounter Discharge Information Details Patient Name: Date of Service: [place], [name] 11/17/2022 10:00 A M Medical Record Number: [id number] Patient Account Number: [id number] Date of Birth/Sex: Treating RN: [birth year] (66 y.o. [name], [name] Primary Care Sunaina Ferrando: Foye [name] Other Clinician: [name] Referring  Kaijah Abts: Treating Mahki Spikes/Extender: Kandis Cocking in Treatment: 9 Encounter Discharge Information Items Discharge Condition: Stable Ambulatory Status: Knee Scooter Discharge Destination: Home Transportation: Private Auto Accompanied By: None Schedule Follow-up Appointment: Yes Clinical Summary of Care: Electronic Signature(s) Signed: 11/17/2022 2:37:12 PM By: [name] EMT Entered By: [name] on 11/17/2022 14:37:11 Nona Dell R ([id number]) [id number] Page 2 of 2 -------------------------------------------------------------------------------- Vitals Details Patient Name: Date of Service: [place], [name] 11/17/2022 10:00 A M Medical Record Number: [id number] Patient Account Number: [id number] Date of Birth/Sex: Treating RN: 1956-05-22 (66 y.o. Tammy Sours Primary Care Tracy Kinner: Foye [name] Other Clinician: [name] Referring Faustine Tates: Treating Zykera Abella/Extender: Kandis Cocking in Treatment: 9 Vital Signs Time Taken: 10:11 Capillary Blood Glucose (mg/dl): 976 Height (in): 74 Reference Range: 80 - 120 mg / dl Weight (lbs): 734 Body Mass Index (BMI): 31.5 Electronic Signature(s) Signed: 11/17/2022 2:23:38 PM By: [name] EMT Entered By: [name] on 11/17/2022 14:23:37

## 2022-11-18 ENCOUNTER — Encounter (HOSPITAL_BASED_OUTPATIENT_CLINIC_OR_DEPARTMENT_OTHER): Payer: Medicare Other | Admitting: General Surgery

## 2022-11-18 NOTE — Progress Notes (Signed)
[name], [name] ([id number]) [id number] Page 1 of 2 Visit Report for 11/17/2022 HBO Details Patient Name: Date of Service: [name]. 11/17/2022 10:00 A M Medical Record Number: [id number] Patient Account Number: [id number] Date of Birth/Sex: Treating RN: August 30, 1956 (66 y.o. 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 HBO Treatment Details Treatment Number: 16 Patient Type: Outpatient Chamber Type: Monoplace Chamber Serial #: [id number] Treatment Protocol: 2.0 ATA with 90 minutes oxygen, with two 5 minute air breaks Treatment Details Compression Rate Down: 1.0 psi / minute De-Compression Rate Up: 1.5 psi / minute A breaks and breathing ir Compress Tx Pressure periods Decompress Decompress Begins Reached (leave unused spaces Begins Ends blank) Chamber Pressure (ATA 1 2 2 2 2 2  --2 1 ) Clock Time (24 hr) 11:01 11:19 11:49 11:54 12:24 12:29 - - 12:59 13:11 Treatment Length: 130 (minutes) Treatment Segments: 4 Vital Signs Capillary Blood Glucose Reference Range: 80 - 120 mg / dl HBO Diabetic Blood Glucose Intervention Range: <131 mg/dl or >249 mg/dl Time Vitals Blood Respiratory Capillary Blood Glucose Pulse Action Type: Pulse: Temperature: Taken: Pressure: Rate: Glucose (mg/dl): Meter #: Oximetry (%) Taken: Pre 10:11 214 Post 13:16 168/82 67 18 97.7 241 Pre 10:42 276 Pre 10:27 137/73 79 18 98.1 Treatment Response Treatment Toleration: Well Treatment Completion Status: Treatment Completed without Adverse Event Treatment Notes Patient stated that he did not take his  hyperglycemic medication this morning. Dr. Celine Ahr informed. After treatment started had to back off pressure due to the patient forgot to clear his ears. He was able to clear, then decompression restarted. Physician HBO Attestation: I certify that I supervised this HBO treatment in accordance with Medicare guidelines. A trained emergency response team is readily available per Yes hospital policies and procedures. Continue HBOT as ordered. Yes Electronic Signature(s) Signed: 11/17/2022 5:05:16 PM By: Fredirick Maudlin MD FACS Previous Signature: 11/17/2022 2:35:37 PM Version By: Valeria Batman EMT Entered By: Fredirick Maudlin on 11/17/2022 Hauser, [place] ([id number].pdf Page 2 of 2 -------------------------------------------------------------------------------- HBO Safety Checklist Details Patient Name: Date of Service: [place], [name] 11/17/2022 10:00 A M Medical Record Number: [id number] Patient Account Number: [id number] Date of Birth/Sex: Treating RN: 06/08/1956 (66 y.o. [name], Meta.Reding Primary Care Aaric Dolph: Nelda Bucks Other Clinician: Valeria Batman Referring Shateka Petrea: Treating Earlin Sweeden/Extender: Doristine Bosworth in Treatment: 9 HBO Safety Checklist Items Safety Checklist Consent Form Signed Patient voided / foley secured and emptied When did you last eato 0820 Last dose of injectable or oral agent Last night 2330 Ostomy pouch emptied and vented if applicable NA All implantable devices assessed, documented and approved NA Intravenous access site secured and place NA Valuables secured Linens and cotton and cotton/polyester blend (less than 51% polyester) Personal oil-based products / skin lotions / body lotions removed Wigs or hairpieces removed NA Smoking or tobacco materials removed Books / newspapers / magazines / loose paper removed Cologne, aftershave, perfume and deodorant removed Jewelry removed  (may wrap wedding band) Make-up removed NA Hair care products removed Battery operated devices (external) removed Heating patches and chemical warmers removed Titanium eyewear removed NA Nail polish cured greater than 10 hours NA Casting material cured greater than 10 hours NA Hearing aids removed NA Loose  dentures or partials removed removed by patient Prosthetics have been removed NA Patient demonstrates correct use of air break device (if applicable) Patient concerns have been addressed Patient grounding bracelet on and cord attached to chamber Specifics for Inpatients (complete in addition to above) Medication sheet sent with patient NA Intravenous medications needed or due during therapy sent with patient NA Drainage tubes (e.g. nasogastric tube or chest tube secured and vented) NA Endotracheal or Tracheotomy tube secured NA Cuff deflated of air and inflated with saline NA Airway suctioned NA Notes The safety checklist was done before the treatment was started. Patient stated that he had a dingdong for breakfast and did not take his hyperglycemic medication this morning. Electronic Signature(s) Signed: 11/17/2022 2:29:42 PM By: [name] EMT Entered By: [name] on 11/17/2022 14:29:41

## 2022-11-18 NOTE — Progress Notes (Signed)
SHUN, PLETZ R ([id number]) [id number] Page 1 of 2 Visit Report for 11/17/2022 Problem List Details Patient Name: Date of Service: [place], [name] 11/17/2022 10:00 A M Medical Record Number: [id number] Patient Account Number: [id number] Date of Birth/Sex: Treating RN: 04/07/1956 (66 y.o. 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 11/17/2022 Medical Record Number: [id number] Patient Account Number: [id number] Date of Birth/Sex: Treating RN: [birth year] (66 y.o. Tammy Sours Primary Care Provider: Foye [name] Other Clinician: [name] Referring Provider: Treating Provider/Extender: Kandis Cocking in Treatment: [address] 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, left ankle and foot E11.65 Type 2 diabetes mellitus with hyperglycemia E11.621 Type 2 diabetes mellitus with foot ulcer Facility Procedures : CPT4 Code Description: [id number] G0277-(Facility Use Only) HBOT full body chamber, , ICD-10 Diagnosis Description [phone] Other chronic osteomyelitis, left ankle and foot L97.424 Non-pressure chronic ulcer of left heel and midfoot with n L97.523  Non-pressure chronic ulcer of other part of left foot with E11.621 Type 2 diabetes mellitus with foot ulcer Modifier: ecrosis of bo necrosis of Quantity: 4 ne muscle Physician Procedures : CPT4 Code Description Modifier [id number] - WC PHYS HYPERBARIC OXYGEN THERAPY ICD-10 Diagnosis Description 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

## 2022-11-19 ENCOUNTER — Encounter (HOSPITAL_BASED_OUTPATIENT_CLINIC_OR_DEPARTMENT_OTHER): Payer: Medicare Other | Admitting: Internal Medicine

## 2022-11-19 DIAGNOSIS — L97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: Secondary | ICD-10-CM

## 2022-11-19 DIAGNOSIS — M86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, left ankle and foot: Secondary | ICD-10-CM

## 2022-11-19 DIAGNOSIS — E11621 Type 2 diabetes mellitus with foot ulcer: Secondary | ICD-10-CM | POA: Diagnosis not present

## 2022-11-19 DIAGNOSIS — L97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: Secondary | ICD-10-CM | POA: Diagnosis not present

## 2022-11-19 LAB — GLUCOSE, CAPILLARY
Glucose-Capillary: 169 mg/dL — ABNORMAL HIGH (ref 70–99)
Glucose-Capillary: 182 mg/dL — ABNORMAL HIGH (ref 70–99)

## 2022-11-20 NOTE — Progress Notes (Signed)
[name], DOO [name] ([id number]) [id number] Page 1 of 2 Visit Report for 11/19/2022 HBO Details Patient Name: Date of Service: [name]. 11/19/2022 10:00 A M Medical Record Number: [id number] Patient Account Number: [id number] Date of Birth/Sex: Treating RN: Mar 24, 1956 (66 y.o. 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 HBO Treatment Details Treatment Number: 17 Patient Type: Outpatient Chamber Type: Monoplace Chamber Serial #: [id number] Treatment Protocol: 2.0 ATA with 90 minutes oxygen, with two 5 minute air breaks Treatment Details Compression Rate Down: 1.5 psi / minute De-Compression Rate Up: 1.5 psi / minute A breaks and breathing ir Compress Tx Pressure periods Decompress Decompress Begins Reached (leave unused spaces Begins Ends blank) Chamber Pressure (ATA 1 2 2 2 2 2  --2 1 ) Clock Time (24 hr) 11:26 11:42 12:12 12:16 12:47 12:52 - - 13:22 13:38 Treatment Length: 132 (minutes) Treatment Segments: 4 Vital Signs Capillary Blood Glucose Reference Range: 80 - 120 mg / dl HBO Diabetic Blood Glucose Intervention Range: <131 mg/dl or mg/dl Type: Time Vitals Blood Respiratory Capillary Blood Glucose Pulse Action Pulse: Temperature: Taken: Pressure: Rate: Glucose (mg/dl): Meter #: Oximetry (%) Taken: Pre 11:13 137/67 73 18 98 182 1 none per protocol Post 13:38 164/84 70 18 98.2 196 1 none per protocol Treatment Response Treatment Toleration: Well Treatment Completion Status: Treatment Completed without Adverse Event Treatment Notes Patient arrived, prepped for treatment,  informed >863 that his glucose level was 430 mg/dL this morning around [place]. Patient self-administered insulin. Prior to arriving patient measured "264" mg/dL, followed by "8177" in the parking lot at wound care. Blood glucose was 182 mg/dL for pre-treatment vitals. Patient seemed worried so we re-checked blood glucose prior to treatment with a result of 169 mg/dL. For safety patient was given an 8 oz Glucerna with [name] agreeing. Patient tolerated compression at increasing speed with increasing pressure. During decompression I had rate set at 2 psi/min from treatment depth until reaching 14 psig, 1.75 psi/min until reaching 5 psig and 1.5 psi/min until reaching 1 ATA. Patient denied issues with ear equalization. Patient was stable upon discharge. Electronic Signature(s) Signed: 11/19/2022 4:22:24 PM By: 14/07/2022 CHT EMT BS , , Signed: 11/22/2022 3:53:46 PM By: 14/10/2022 DO Entered By: [name] on 11/19/2022 16:22:24 14/07/2022 ([name].pdf Page 2 of 2 -------------------------------------------------------------------------------- HBO Safety Checklist Details Patient Name: Date of Service: [place], [name] 11/19/2022 10:00 A M Medical Record Number: 14/07/2022 Patient Account Number: [id number] Date of Birth/Sex: Treating RN: 18-Dec-1955 (66 y.o. 78, [name] Primary Care Kirat Mezquita: [name] Other Clinician: Foye [name] Referring [name]: Treating Tan Clopper/Extender: [name] in Treatment: 10 HBO Safety Checklist Items Safety Checklist Consent Form Signed Patient voided / foley secured and emptied When did you last eato last night Last dose of injectable or oral agent 0600 Ostomy pouch emptied and vented if applicable NA All implantable devices assessed, documented and approved NA Intravenous access site secured and place NA Valuables secured Linens and cotton and  cotton/polyester blend (less than 51% polyester) Personal oil-based products / skin lotions / body lotions removed Wigs or hairpieces removed NA Smoking or tobacco materials removed NA Books / newspapers / magazines / loose paper removed Cologne, [place],  perfume and deodorant removed Jewelry removed (may wrap wedding band) Make-up removed NA Hair care products removed Battery operated devices (external) removed Heating patches and chemical warmers removed Titanium eyewear removed Nail polish cured greater than 10 hours NA Casting material cured greater than 10 hours NA Hearing aids removed NA Loose dentures or partials removed dentures removed Prosthetics have been removed NA Patient demonstrates correct use of air break device (if applicable) Patient concerns have been addressed Patient grounding bracelet on and cord attached to chamber Specifics for Inpatients (complete in addition to above) Medication sheet sent with patient NA Intravenous medications needed or due during therapy sent with patient NA Drainage tubes (e.g. nasogastric tube or chest tube secured and vented) NA Endotracheal or Tracheotomy tube secured NA Cuff deflated of air and inflated with saline NA Airway suctioned NA Notes Paper version used prior to treatment. Electronic Signature(s) Signed: 11/19/2022 3:57:02 PM By: Haywood Pao CHT EMT BS , , Entered By: Haywood Pao on 11/19/2022 15:57:02

## 2022-11-20 NOTE — Progress Notes (Signed)
KHYSON, SEBESTA R ([id number]) [id number] Page 1 of 2 Visit Report for 11/19/2022 Arrival Information Details Patient Name: Date of Service: [place], [name] 11/19/2022 10:00 A M Medical Record Number: [id number] Patient Account Number: [id number] Date of Birth/Sex: Treating RN: 30-Mar-1956 (66 y.o. [name], [name] Primary Care Kasarah Sitts: Foye [name] Other Clinician: [name] Referring Delawrence Fridman: Treating Avanell Banwart/Extender: [name] in Treatment: 10 Visit Information History Since Last Visit All ordered tests and consults were completed: Yes Patient Arrived: Knee Scooter Added or deleted any medications: No Arrival Time: 10:32 Any new allergies or adverse reactions: No Accompanied By: self Had a fall or experienced change in No Transfer Assistance: None activities of daily living that may affect Patient Identification Verified: Yes risk of falls: Secondary Verification Process Completed: Yes Signs or symptoms of abuse/neglect since last visito No Patient Requires Transmission-Based Precautions: No Hospitalized since last visit: No Patient Has Alerts: No Implantable device outside of the clinic excluding No cellular tissue based products placed in the center since last visit: Pain Present Now: No Electronic Signature(s) Signed: 11/19/2022 3:55:16 PM By: Haywood Pao CHT EMT BS , , Entered By: Haywood Pao on 11/19/2022 15:55:16 -------------------------------------------------------------------------------- Encounter Discharge Information Details Patient Name: Date of Service: [place], [name] 11/19/2022 10:00 A M Medical Record Number: [id number] Patient Account Number: [id number] Date of Birth/Sex: Treating RN: 05-18-1956 (66 y.o. [name], Yvonne Kendall Primary Care Jordan Caraveo: Foye [name] Other Clinician: [name] Referring Olen Eaves: Treating Percy Winterrowd/Extender: [name] in Treatment: 10 Encounter Discharge Information Items Discharge Condition: Stable Ambulatory Status: Knee Scooter Discharge Destination: Home Transportation: Private Auto Accompanied By: self Schedule Follow-up Appointment: No Clinical Summary of Care: Electronic Signature(s) Signed: 11/19/2022 4:23:12 PM By: Haywood Pao CHT EMT BS , , Entered By: Haywood Pao on 11/19/2022 16:23:12 [name] ([id number]) [id number] Page 2 of 2 -------------------------------------------------------------------------------- Vitals Details Patient Name: Date of Service: [place], [name] 11/19/2022 10:00 A M Medical Record Number: [id number] Patient Account Number: [id number] Date of Birth/Sex: Treating RN: April 02, 1956 (66 y.o. [name], [name] Primary Care Jillane Po: Foye [name] Other Clinician: [name] Referring Brytney Somes: Treating Ambrea Hegler/Extender: [name] in Treatment: 10 Vital Signs Time Taken: 11:13 Temperature (F): 98 Height (in): 74 Pulse (bpm): 73 Weight (lbs): 245 Respiratory Rate (breaths/min): 18 Body Mass Index (BMI): 31.5 Blood Pressure (mmHg): 137/67 Capillary Blood Glucose (mg/dl): 767 Reference Range: 80 - 120 mg / dl Electronic Signature(s) Signed: 11/19/2022 3:55:49 PM By: Haywood Pao CHT EMT BS , , Entered By: Haywood Pao on 11/19/2022 15:55:49

## 2022-11-22 ENCOUNTER — Encounter (HOSPITAL_BASED_OUTPATIENT_CLINIC_OR_DEPARTMENT_OTHER): Payer: Medicare Other | Admitting: Internal Medicine

## 2022-11-22 DIAGNOSIS — E11621 Type 2 diabetes mellitus with foot ulcer: Secondary | ICD-10-CM | POA: Diagnosis not present

## 2022-11-22 LAB — GLUCOSE, CAPILLARY
Glucose-Capillary: 196 mg/dL — ABNORMAL HIGH (ref 70–99)
Glucose-Capillary: 117 mg/dL — ABNORMAL HIGH (ref 70–99)
Glucose-Capillary: 79 mg/dL (ref 70–99)

## 2022-11-22 NOTE — Progress Notes (Signed)
RASTUS, BORTON ([id number]) [id number] Page 1 of 1 Visit Report for 11/19/2022 SuperBill Details Patient Name: Date of Service: [name], [name] [name] 11/19/2022 Medical Record Number: [id number] Patient Account Number: [id number] Date of Birth/Sex: Treating RN: 23-Jan-1956 (66 y.o. 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, left ankle and foot E11.65 Type 2 diabetes mellitus with hyperglycemia E11.621 Type 2 diabetes mellitus with foot ulcer Facility Procedures CPT4 Code Description Modifier Quantity [id number] G0277-(Facility Use Only) HBOT full body chamber, , 4 ICD-10 Diagnosis Description [phone] Other chronic osteomyelitis, 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 Physician Procedures Quantity CPT4 Code Description Modifier [id number] - WC PHYS HYPERBARIC OXYGEN THERAPY 1 ICD-10 Diagnosis Description 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, 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

## 2022-11-22 NOTE — Progress Notes (Signed)
BOYKIN, BAETZ R ([id number]) [id number] Page 1 of 3 Visit Report for 11/22/2022 Arrival Information Details Patient Name: Date of Service: [place], [name] 11/22/2022 10:00 A M Medical Record Number: [id number] Patient Account Number: [id number] Date of Birth/Sex: Treating RN: 03/12/1956 (66 y.o. [name], [name] Primary Care Letina Luckett: Foye [name] Other Clinician: Haywood Pao Referring Kenlyn Lose: Treating Albertus Chiarelli/Extender: Little Ishikawa in Treatment: 10 Visit Information History Since Last Visit All ordered tests and consults were completed: Yes Patient Arrived: Ambulatory Added or deleted any medications: No Arrival Time: 12:45 Any new allergies or adverse reactions: No Accompanied By: self Had a fall or experienced change in No Transfer Assistance: None activities of daily living that may affect Patient Identification Verified: Yes risk of falls: Secondary Verification Process Completed: Yes Signs or symptoms of abuse/neglect since last visito No Patient Requires Transmission-Based Precautions: No Hospitalized since last visit: No Patient Has Alerts: No Implantable device outside of the clinic excluding No cellular tissue based products placed in the center since last visit: Pain Present Now: No Electronic Signature(s) Signed: 11/22/2022 12:57:51 PM By: Haywood Pao CHT EMT BS , , Entered By: Haywood Pao on 11/22/2022 12:57:51 -------------------------------------------------------------------------------- Clinic Level of Care Assessment Details Patient Name: Date of Service: [place], [name] 11/22/2022 10:00 A M Medical Record Number: [id number] Patient Account Number: [id number] Date of Birth/Sex: Treating RN: November 17, 1956 (66 y.o. [name], Yvonne Kendall Primary Care Rifka Ramey: Foye [name] Other Clinician: Haywood Pao Referring Issis Lindseth: Treating Sparkle Aube/Extender: Little Ishikawa in Treatment: 10 Clinic Level of Care Assessment Items TOOL 4 Quantity Score []  - 0 Use when only an EandM is performed on FOLLOW-UP visit ASSESSMENTS - Nursing Assessment / Reassessment []  - 0 Reassessment of Co-morbidities (includes updates in patient status) []  - 0 Reassessment of Adherence to Treatment Plan ASSESSMENTS - Wound and Skin A ssessment / Reassessment []  - 0 Simple Wound Assessment / Reassessment - one wound []  - 0 Complex Wound Assessment / Reassessment - multiple wounds []  - 0 Dermatologic / Skin Assessment (not related to wound area) ASSESSMENTS - Focused Assessment []  - 0 Circumferential Edema Measurements - multi extremities []  - 0 Nutritional Assessment / Counseling / Intervention PRENTISS, POLIO ( ) [id number] Page 2 of 3 []  - 0 Lower Extremity Assessment (monofilament, tuning fork, pulses) []  - 0 Peripheral Arterial Disease Assessment (using hand held doppler) ASSESSMENTS - Ostomy and/or Continence Assessment and Care []  - 0 Incontinence Assessment and Management []  - 0 Ostomy Care Assessment and Management (repouching, etc.) PROCESS - Coordination of Care []  - 0 Simple Patient / Family Education for ongoing care []  - 0 Complex (extensive) Patient / Family Education for ongoing care []  - 0 Staff obtains , Records, T Results / Process Orders est []  - 0 Staff telephones HHA, Nursing Homes / Clarify orders / etc []  - 0 Routine Transfer to another Facility (non-emergent condition) []  - 0 Routine Hospital Admission (non-emergent condition) []  - 0 New Admissions / / Ordering NPWT Apligraf, etc. , []  - 0 Emergency Hospital Admission (emergent condition) []  - 0 Simple Discharge Coordination []  - 0 Complex (extensive) Discharge Coordination PROCESS - Special Needs []  - 0 Pediatric / Minor Patient Management []  - 0 Isolation Patient Management []  - 0 Hearing /  Language / Visual special needs []  - 0 Assessment of Community assistance (transportation, D/C planning, etc.) []  - 0 Additional assistance / Altered mentation []  - 0 Support Surface(s) Assessment (bed, cushion, seat, etc.) INTERVENTIONS -  Wound Cleansing / Measurement []  - 0 Simple Wound Cleansing - one wound []  - 0 Complex Wound Cleansing - multiple wounds []  - 0 Wound Imaging (photographs - any number of wounds) []  - 0 Wound Tracing (instead of photographs) []  - 0 Simple Wound Measurement - one wound []  - 0 Complex Wound Measurement - multiple wounds INTERVENTIONS - Wound Dressings []  - 0 Small Wound Dressing one or multiple wounds []  - 0 Medium Wound Dressing one or multiple wounds []  - 0 Large Wound Dressing one or multiple wounds []  - 0 Application of Medications - topical []  - 0 Application of Medications - injection INTERVENTIONS - Miscellaneous []  - 0 External ear exam []  - 0 Specimen Collection (cultures, biopsies, blood, body fluids, etc.) []  - 0 Specimen(s) / Culture(s) sent or taken to Lab for analysis []  - 0 Patient Transfer (multiple staff / / Similar devices) []  - 0 Simple Staple / Suture removal (25 or less) []  - 0 Complex Staple / Suture removal (26 or more) X- 1 10 Hypo / Hyperglycemic Management (close monitor of Blood Glucose) JAMALL, STROHMEIER R (  .pdf Page 3 of 3 []  - 0 Ankle / Brachial Index (ABI) - do not check if billed separately []  - 0 Vital Signs Has the patient been seen at the hospital within the last three years: Yes Total Score: 10 Level Of Care: New/Established - Level 1 Electronic Signature(s) Unsigned Entered By: on 11/22/2022 13:07:18 -------------------------------------------------------------------------------- General Visit Notes Details Patient Name: Date of Service: [place],  LD R. 11/22/2022 10:00 A M Medical Record Number: Patient  Account Number: Date of Birth/Sex: Treating RN: [birth year] (66 y.o. Primary Care Navayah Sok: Nurse, adult Other Clinician: Referring Syrena Burges: Treating Vernica Wachtel/Extender: in Treatment: 10 Notes Called Mr. Berkley around 0930 to inform him of delay in schedule. Patient said he had not eaten as his blood sugar was around 300. Patient stated that prior to arrival for appointment he could tell that his blood sugar was getting low and went to get something to eat. He stated that he took [id number] this morning and Novolog "approximately 0750." Pre-treatment blood glucose at 1040 was 79 mg/dL. Patient was given 8 oz Glucerna, per protocol, and glucose was remeasured approximately 30 minutes later. At 1115 Patient's blood glucose was 117mg /dL. Above 110 mg/dL per protocol, Informed Dr. ) [id number] of status and treatment was rescheduled for 11/23/2022. Patient was stable upon discharge. Electronic Signature(s) Signed: 11/22/2022 1:04:36 PM By: Haywood Pao CHT EMT BS , , Entered By: 14/10/2022 on 11/22/2022 13:04:36 -------------------------------------------------------------------------------- Vitals Details Patient Name: Date of Service: [name], [name] 11/22/2022 10:00 A M Medical Record Number: [id number] Patient Account Number: [id number] Date of Birth/Sex: Treating RN: 06/09/1956 (66 y.o. Tammy Sours Primary Care Britnee Mcdevitt: Foye [name] Other Clinician: Haywood Pao Referring Malaina Mortellaro: Treating Modelle Vollmer/Extender: Little Ishikawa in Treatment: 10 Vital Signs Time Taken: 10:40 Capillary Blood Glucose (mg/dl): 79 Height (in): 74 Reference Range: 80 - 120 mg / dl Weight (lbs): [name] Body Mass Index (BMI): 31.5 Electronic Signature(s) Signed: 11/22/2022 12:58:11 PM By: CHT EMT BS , , Entered By: Leanord Hawking on 11/22/2022 12:58:11

## 2022-11-23 ENCOUNTER — Encounter (HOSPITAL_BASED_OUTPATIENT_CLINIC_OR_DEPARTMENT_OTHER): Payer: Medicare Other | Admitting: Internal Medicine

## 2022-11-23 DIAGNOSIS — L97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: Secondary | ICD-10-CM | POA: Diagnosis not present

## 2022-11-23 DIAGNOSIS — M86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, left ankle and foot: Secondary | ICD-10-CM

## 2022-11-23 DIAGNOSIS — L97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: Secondary | ICD-10-CM

## 2022-11-23 DIAGNOSIS — E11621 Type 2 diabetes mellitus with foot ulcer: Secondary | ICD-10-CM

## 2022-11-23 LAB — GLUCOSE, CAPILLARY: Glucose-Capillary: 141 mg/dL — ABNORMAL HIGH (ref 70–99)

## 2022-11-23 NOTE — Progress Notes (Signed)
[name], [name] ([id number]) [id number] Page 1 of 7 Visit Report for 11/23/2022 HPI Details Patient Name: Date of Service: [name], [place] [name]. 11/23/2022 9:15 A M Medical Record Number: [id number] Patient Account Number: [id number] Date of Birth/Sex: Treating RN: 04-20-1956 (66 y.o. M) Primary Care Provider: Nelda [name] Other Clinician: Referring Provider: Treating Provider/Extender: [name] in Treatment: 10 History of Present Illness HPI Description: ADMISSION 09/10/2022 This is a 66 year old poorly controlled type II diabetic (last hemoglobin A1c 10.8%) who has had an ulcer on his heel for over 3 years. He has been seen in multiple wound care centers, including [name] He reports that at least 3 doctors have recommended that he undergo below-knee amputation. He most recently met with [name], a vascular surgeon affiliated with [name]. 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 Unfortunately, the patient has some extenuating social circumstances including the fact that he cares for his wife who has stage IV colon cancer and still works, driving vehicles for [employer]. 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 He was recently hospitalized at [name] for sepsis and currently has a PICC line through which he receives IV antibiotics. He reports having had another MRI during that [name] stay along with a chest x-ray and EKG. He apparently contacted one of the hyperbaric therapy techs here and asked a number of questions about hyperbaric oxygen treatments. He subsequently self-referred to our [name] to undergo further evaluation and management. I mention to him that [name] actually has hyperbaric chambers, but he states that he lives in [place] and this would be more  convenient for him given the intensive nature of the therapy and time requirement. ABI in clinic today was 0.94. The patient actually has 2 wounds. There is a wound on the dorsum of his left foot with heavy black eschar and slough present. After debridement, this was demonstrated to involve the muscle and the extensor tendons are exposed. On his heel, there is essentially a "shark bite" type wound, with much of the heel fat pad absent. The muscle layer is exposed. There is blue-green staining around the perimeter of the wound, but no significant odor. He says he has been applying collagen to the wound on his heel and Silvadene and Betadine to the wound on his dorsal foot. 09/20/2022: The heel wound is quite macerated with wet periwound callus. There is slough accumulation on the surface. The dorsal foot wound looks better this week. There is still exposed tendon, but it is fairly clean with just a little biofilm buildup. We are still working on gathering the required documentation to submit for pretreatment review for hyperbaric oxygen therapy. 09/29/2022: The dorsal foot wound continues to improve. I do not see any exposed tendon at this point. There is just some slough accumulation on the wound surface. He continues to have very wet macerated periwound callus on his heel. There is slough on the surface, but it is loose and thin. There is an area of undermining at the 11 o'clock position, but the overlying skin and subcutaneous tissue is healthy and viable. He has been approved for hyperbaric oxygen therapy and will start treatment tomorrow. 10/06/2022: Continued contraction and improvement of the dorsal foot wound. There is just a little bit of slough accumulation on the wound surface. The periwound callus continues to accumulate on the heel wound and it  is quite macerated. It is also persistently found with blue-green staining present. He did initiate his hyperbaric oxygen therapy, but has had  significant difficulty with decompression. He will be going to ENT to have PE tubes placed. 10/18/2022: His hyperbaric oxygen therapy is on hold while his otological issues are being addressed. He came in again today with the periwound skin on both the dorsal aspect of his foot and his calcaneus completely macerated. The blue-green discoloration, however, has abated and the undermining on the calcaneus has improved. The dorsal foot wound has also contracted somewhat. 10/26/2022: The dorsal foot wound continues to contract and fill with good granulation tissue. The heel, once again, has a rim of macerated callus, but the undermining and tunneling continues to contract. He has completed his oral antibiotics and has been using the [name] topical compounded antibiotic for his dressing changes at home. He is scheduled to see ENT this afternoon. 11/08/2022: The dorsal foot wound is flush with the surrounding skin and has a good granulation tissue surface. There is some slough accumulation. As usual, the heel has a rim of macerated callus but the dimensions are smaller and the tunneling and undermining have contracted further. He has resumed his hyperbaric oxygen therapy. 12/12; patient seen for wound evaluation. He is tolerating HBO although we could not dive him yesterday because of relative hypoglycemia and the fact he had given himself NovoLog insulin before he came to clinic. Today his blood sugar is in the 180 range she should be fine. He has a large wound on the plantar calcaneus on the right and a more superficial area on the dorsal foot. He is using a scooter for [employer]) Signed: 11/23/2022 3:46:52 PM By: Linton Ham MD Entered By: Linton Ham on 11/23/2022 10:08:56 [name] ([id number]) [id number] Page 2 of 7 -------------------------------------------------------------------------------- Physical Exam Details Patient Name:  Date of Service: [name]. 11/23/2022 9:15 A M Medical Record Number: [id number] Patient Account Number: [id number] Date of Birth/Sex: Treating RN: 1956-08-01 (66 y.o. M) Primary Care Provider: Nelda [name] Other Clinician: Referring Provider: Treating Provider/Extender: [name] in Treatment: 10 Constitutional Patient is hypertensive.. Pulse regular and within target range for patient.Marland Kitchen Respirations regular, non-labored and within target range.. Temperature is normal and within the target range for the patient.Marland Kitchen Appears in no distress. Notes Wound exam; the dorsal foot looks as though it is progressing towards healing however the plantar heel is extensive on the left. There is slight undermining distally but that seems to have come in. The base of the wound does not look unhealthy there is no surrounding infection. Electronic Signature(s) Signed: 11/23/2022 3:46:52 PM By: Linton Ham MD Entered By: Linton Ham on 11/23/2022 10:10:10 -------------------------------------------------------------------------------- Physician Orders Details Patient Name: Date of Service: [place], [name] 11/23/2022 9:15 A M Medical Record Number: [id number] Patient Account Number: [id number] Date of Birth/Sex: Treating RN: May 14, 1956 (66 y.o. [name] Primary Care Provider: Nelda [name] Other Clinician: Referring Provider: Treating Provider/Extender: [name] in Treatment: 10 Verbal / Phone Orders: No Diagnosis Coding Follow-up Appointments ppointment in 1 week. - Dr. Celine Ahr Rm 4 Return A Bathing/ Shower/ Hygiene May shower with protection but do not get wound dressing(s) wet. Edema Control - Lymphedema / SCD / Other Left Lower Extremity Elevate legs to the level of the heart or above for 30 minutes daily and/or when sitting, a frequency of: Avoid standing for long periods of time. Hyperbaric Oxygen  Therapy  Wound #1 Left Calcaneus Evaluate for HBO Therapy Indication: - chronic refractory osteomyelitis If appropriate for treatment, begin HBOT per protocol: 2.5 ATA for 90 Minutes with 2 Five (5) Minute A Breaks ir Total Number of Treatments: - 40 One treatments per day (delivered Monday through Friday unless otherwise specified in Special Instructions below): Finger stick Blood Glucose Pre- and Post- HBOT Treatment. Follow Hyperbaric Oxygen Glycemia Protocol A frin (Oxymetazoline HCL) 0.05% nasal spray - 1 spray in both nostrils daily as needed prior to HBO treatment for difficulty clearing ears Wound Treatment Wound #1 - Calcaneus Wound Laterality: Left Cleanser: Soap and Water 1 x Per Day/30 Days Discharge Instructions: May shower and wash wound with dial antibacterial soap and water prior to dressing change. Peri-Wound Care: Sween Lotion (Moisturizing lotion) 1 x Per Day/30 Days Discharge Instructions: Apply moisturizing lotion as directed Topical: Gentamicin 1 x Per Day/30 Days Discharge Instructions: As directed by physician Prim Dressing: KerraCel Ag Gelling Fiber Dressing, 4x5 in (silver alginate) (Generic) 1 x Per Day/30 Days PERFECTO, PURDY ([id number]) [id number] Page 3 of 7 Discharge Instructions: Apply silver alginate to wound bed as instructed Secondary Dressing: ABD Pad, 5x9 (Generic) 1 x Per Day/30 Days Discharge Instructions: Apply over primary dressing as directed. Secondary Dressing: Zetuvit Plus Silicone Border Dressing 5x5 (in/in) (Generic) 1 x Per Day/30 Days Discharge Instructions: Apply silicone border over primary dressing as directed. Secured With: 62M Medipore Public affairs consultant Surgical T 2x10 (in/yd) (Generic) 1 x Per Day/30 Days ape Discharge Instructions: Secure with tape as directed. Compression Wrap: Kerlix Roll 4.5x3.1 (in/yd) (Generic) 1 x Per Day/30 Days Discharge Instructions: Apply Kerlix and Coban compression as  directed. Compression Wrap: Coban Self-Adherent Wrap 4x5 (in/yd) (Generic) 1 x Per Day/30 Days Discharge Instructions: Apply over Kerlix as directed. Wound #2 - Foot Wound Laterality: Dorsal, Left Cleanser: Soap and Water 1 x Per Day/30 Days Discharge Instructions: May shower and wash wound with dial antibacterial soap and water prior to dressing change. Peri-Wound Care: Sween Lotion (Moisturizing lotion) 1 x Per Day/30 Days Discharge Instructions: Apply moisturizing lotion as directed Topical: Gentamicin 1 x Per Day/30 Days Discharge Instructions: As directed by physician Prim Dressing: KerraCel Ag Gelling Fiber Dressing, 4x5 in (silver alginate) (Generic) 1 x Per Day/30 Days ary Discharge Instructions: Apply silver alginate to wound bed as instructed Secondary Dressing: ALLEVYN Heel 4 1/2in x 5 1/2in / 10.5cm x 13.5cm (Generic) 1 x Per Day/30 Days Discharge Instructions: Apply over primary dressing as directed. Secondary Dressing: Zetuvit Plus Silicone Border Dressing 5x5 (in/in) (Generic) 1 x Per Day/30 Days Discharge Instructions: Apply silicone border over primary dressing as directed. Secured With: 62M Medipore Public affairs consultant Surgical T 2x10 (in/yd) 1 x Per Day/30 Days ape Discharge Instructions: Secure with tape as directed. Compression Wrap: Kerlix Roll 4.5x3.1 (in/yd) (Generic) 1 x Per Day/30 Days Discharge Instructions: Apply Kerlix and Coban compression as directed. Compression Wrap: Coban Self-Adherent Wrap 4x5 (in/yd) (Generic) 1 x Per Day/30 Days Discharge Instructions: Apply over Kerlix as directed. GLYCEMIA INTERVENTIONS PROTOCOL PRE-HBO GLYCEMIA INTERVENTIONS ACTION INTERVENTION Obtain pre-HBO capillary blood glucose (ensure 1 physician order is in chart). A. Notify HBO physician and await physician orders. 2 If result is 70 mg/dl or below: B. If the result meets the [name] definition of a critical result, follow [name] policy. A. Give patient an 8 ounce Glucerna  Shake, an 8 ounce Ensure, or 8 ounces of a Glucerna/Ensure equivalent dietary supplement*. B. Wait 30 minutes. If result is 71 mg/dl to 130 mg/dl:  C. Retest patients capillary blood glucose (CBG). D. If result greater than or equal to 110 mg/dl, proceed with HBO. If result less than 110 mg/dl, notify HBO physician and consider holding HBO. If result is 131 mg/dl to 249 mg/dl: A. Proceed with HBO. A. Notify HBO physician and await physician orders. B. It is recommended to hold HBO and do If result is 250 mg/dl or greater: blood/urine ketone testing. C. If the result meets the [name] definition of a critical result, follow [name] policy. POST-HBO GLYCEMIA INTERVENTIONS ACTION INTERVENTION Obtain post HBO capillary blood glucose (ensure 1 physician order is in chart). A. Notify HBO physician and await physician orders. 2 If result is 70 mg/dl or below: B. If the result meets the [name] definition of a HANAD, LEINO ([id number]) [phone].pdf Page 4 of 7 critical result, follow [name] policy. A. Give patient an 8 ounce Glucerna Shake, an 8 ounce Ensure, or 8 ounces of a Glucerna/Ensure equivalent dietary supplement*. B. Wait 15 minutes for symptoms of If result is 71 mg/dl to 100 mg/dl: hypoglycemia (i.e. nervousness, anxiety, sweating, chills, clamminess, irritability, confusion, tachycardia or dizziness). C. If patient asymptomatic, discharge patient. If patient symptomatic, repeat capillary blood glucose (CBG) and notify HBO physician. If result is 101 mg/dl to 249 mg/dl: A. Discharge patient. A. Notify HBO physician and await physician orders. B. It is recommended to do blood/urine ketone If result is 250 mg/dl or greater: testing. C. If the result meets the [name] definition of a critical result, follow [name] policy. *Juice or candies are NOT equivalent products. If patient refuses the Glucerna or Ensure, please consult  the [name] dietitian for an appropriate substitute. Electronic Signature(s) Signed: 11/23/2022 3:46:52 PM By: Linton Ham MD Signed: 11/23/2022 3:54:33 PM By: Sabas Sous By: Adline Peals on 11/23/2022 09:30:15 -------------------------------------------------------------------------------- Problem List Details Patient Name: Date of Service: [place], [name] 11/23/2022 9:15 A M Medical Record Number: [id number] Patient Account Number: [id number] Date of Birth/Sex: Treating RN: 1956/03/03 (66 y.o. 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 09/10/2022 No Yes L97.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 09/10/2022 No Yes Z00.174 Other chronic osteomyelitis, left ankle and foot 09/10/2022 No Yes E11.65 Type 2 diabetes mellitus with hyperglycemia 09/10/2022 No Yes E11.621 Type 2 diabetes mellitus with foot ulcer 09/10/2022 No Yes Inactive Problems Resolved Problems Electronic Signature(s) Signed: 11/23/2022 3:46:52 PM By: Linton Ham MD [name] ([id number]) [phone].pdf Page 5 of 7 Entered By: Linton Ham on 11/23/2022 09:47:14 -------------------------------------------------------------------------------- Progress Note Details Patient Name: Date of Service: [place], [place] [name]. 11/23/2022 9:15 A M Medical Record Number: [id number] Patient Account Number: [id number] Date of Birth/Sex: Treating RN: [birth year] (66 y.o. M) Primary Care Provider: Nelda [name] Other Clinician: Referring Provider: Treating Provider/Extender: [name] in Treatment: 10 Subjective History of  Present Illness (HPI) ADMISSION 09/10/2022 This is a 66 year old poorly controlled type II diabetic (last hemoglobin A1c 10.8%) who has had an ulcer on his heel for over 3 years. He has been seen in multiple wound care centers, including [name] and [name]. He reports that at least 3 doctors have recommended that he undergo below-knee amputation. He most recently met with [name], a vascular surgeon affiliated with  [name]. 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 Unfortunately, the patient has some extenuating social circumstances including the fact that he cares for his wife who has stage IV colon cancer and still works, driving vehicles for [employer]. 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 He was recently hospitalized at [name] for sepsis and currently has a PICC line through which he receives IV antibiotics. He reports having had another MRI during that [name] stay along with a chest x-ray and EKG. He apparently contacted one of the hyperbaric therapy techs here and asked a number of questions about hyperbaric oxygen treatments. He subsequently self-referred to our [name] to undergo further evaluation and management. I mention to him that [name] actually has hyperbaric chambers, but he states that he lives in [place] and this would be more convenient for him given the intensive nature of the therapy and time requirement. ABI in clinic today was 0.94. The patient actually has 2 wounds. There is a wound on the dorsum of his left foot with heavy black eschar and slough present. After debridement, this was demonstrated to involve the muscle and the extensor tendons are exposed. On his heel, there is essentially a "shark bite" type wound, with much of the heel fat pad absent. The muscle layer is exposed. There is blue-green staining around the  perimeter of the wound, but no significant odor. He says he has been applying collagen to the wound on his heel and Silvadene and Betadine to the wound on his dorsal foot. 09/20/2022: The heel wound is quite macerated with wet periwound callus. There is slough accumulation on the surface. The dorsal foot wound looks better this week. There is still exposed tendon, but it is fairly clean with just a little biofilm buildup. We are still working on gathering the required documentation to submit for pretreatment review for hyperbaric oxygen therapy. 09/29/2022: The dorsal foot wound continues to improve. I do not see any exposed tendon at this point. There is just some slough accumulation on the wound surface. He continues to have very wet macerated periwound callus on his heel. There is slough on the surface, but it is loose and thin. There is an area of undermining at the 11 o'clock position, but the overlying skin and subcutaneous tissue is healthy and viable. He has been approved for hyperbaric oxygen therapy and will start treatment tomorrow. 10/06/2022: Continued contraction and improvement of the dorsal foot wound. There is just a little bit of slough accumulation on the wound surface. The periwound callus continues to accumulate on the heel wound and it is quite macerated. It is also persistently found with blue-green staining present. He did initiate his hyperbaric oxygen therapy, but has had significant difficulty with decompression. He will be going to ENT to have PE tubes placed. 10/18/2022: His hyperbaric oxygen therapy is on hold while his otological issues are being addressed. He came in again today with the periwound skin on both the dorsal aspect of his foot and his calcaneus completely macerated. The blue-green discoloration, however, has abated and the undermining on the calcaneus has improved. The dorsal foot wound has also contracted somewhat. 10/26/2022: The dorsal foot wound continues  to contract and fill with good granulation tissue. The heel, once again, has a rim of macerated callus, but the undermining and tunneling continues to contract. He has completed his oral antibiotics and has been using the [name] topical  compounded antibiotic for his dressing changes at home. He is scheduled to see ENT this afternoon. 11/08/2022: The dorsal foot wound is flush with the surrounding skin and has a good granulation tissue surface. There is some slough accumulation. As usual, the heel has a rim of macerated callus but the dimensions are smaller and the tunneling and undermining have contracted further. He has resumed his hyperbaric oxygen therapy. 12/12; patient seen for wound evaluation. He is tolerating HBO although we could not dive him yesterday because of relative hypoglycemia and the fact he had given himself NovoLog insulin before he came to clinic. Today his blood sugar is in the 180 range she should be fine. He has a large wound on the plantar calcaneus on the right and a more superficial area on the dorsal foot. He is using a scooter for offloading Objective Constitutional Patient is hypertensive.. Pulse regular and within target range for patient.Marland Kitchen Respirations regular, non-labored and within target range.. Temperature is normal and within the target range for the patient.Marland Kitchen Appears in no distress. Vitals Time Taken: 9:00 AM, Height: 74 in, Weight: 245 lbs, BMI: 31.5, Temperature: 97.7 F, Pulse: 73 bpm, Respiratory Rate: 20 breaths/min, Blood Pressure: 156/88 mmHg, Capillary Blood Glucose: 190 mg/dl. ZYAD, BOOMER [name] ([id number]) [id number] Page 6 of 7 General Notes: Wound exam; the dorsal foot looks as though it is progressing towards healing however the plantar heel is extensive on the left. There is slight undermining distally but that seems to have come in. The base of the wound does not look unhealthy there is no surrounding  infection. Integumentary (Hair, Skin) Wound #1 status is Open. Original cause of wound was Pressure Injury. The date acquired was: 03/09/2019. The wound has been in treatment 10 weeks. The wound is located on the Left Calcaneus. The wound measures 5.5cm length x 4.5cm width x 0.3cm depth; 19.439cm^2 area and 5.832cm^3 volume. There is Fat Layer (Subcutaneous Tissue) exposed. There is no undermining noted, however, there is tunneling at 12:00 with a maximum distance of 1.5cm. There is a medium amount of serosanguineous drainage noted. The wound margin is fibrotic, thickened scar. There is large (67-100%) red, pink granulation within the wound bed. There is a small (1-33%) amount of necrotic tissue within the wound bed including Adherent Slough. The periwound skin appearance had no abnormalities noted for color. The periwound skin appearance exhibited: Callus, Maceration. The periwound skin appearance did not exhibit: Dry/Scaly. Periwound temperature was noted as No Abnormality. Wound #2 status is Open. Original cause of wound was Pressure Injury. The date acquired was: 07/09/2022. The wound has been in treatment 10 weeks. The wound is located on the Left,Dorsal Foot. The wound measures 3cm length x 2.7cm width x 0.1cm depth; 6.362cm^2 area and 0.636cm^3 volume. There is Fat Layer (Subcutaneous Tissue) exposed. There is no tunneling or undermining noted. There is a medium amount of serosanguineous drainage noted. The wound margin is flat and intact. There is medium (34-66%) pink granulation within the wound bed. There is a medium (34-66%) amount of necrotic tissue within the wound bed including Eschar and Adherent Slough. The periwound skin appearance had no abnormalities noted for texture. The periwound skin appearance had no abnormalities noted for moisture. The periwound skin appearance had no abnormalities noted for color. Periwound temperature was noted as No Abnormality. 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 - Dr. Celine Ahr Rm 4 Bathing/ Shower/ Hygiene: May shower with protection but do not get wound dressing(s) wet. Edema Control - Lymphedema / SCD / Other: Elevate legs to the level of the heart or above for 30 minutes daily and/or when sitting, a frequency of: Avoid standing for long periods of time. Hyperbaric Oxygen Therapy: Wound #1 Left Calcaneus: Evaluate for HBO Therapy Indication: - chronic refractory osteomyelitis If appropriate for treatment, begin HBOT per protocol: 2.5 ATA for 90 Minutes with 2 Five (5) Minute Air Breaks T Number of Treatments: - 40 otal One treatments per day (delivered Monday through Friday unless otherwise specified in Special Instructions below): Finger stick Blood Glucose Pre- and Post- HBOT Treatment. Follow Hyperbaric Oxygen Glycemia Protocol Afrin (Oxymetazoline HCL) 0.05% nasal spray - 1 spray in both nostrils daily as needed prior to HBO treatment for difficulty clearing ears WOUND #1: - Calcaneus Wound Laterality: Left Cleanser: Soap and Water 1 x Per Day/30 Days Discharge Instructions: May shower and wash wound with dial antibacterial soap and water prior to dressing change. Peri-Wound Care: Sween Lotion (Moisturizing lotion) 1 x Per Day/30 Days Discharge Instructions: Apply moisturizing lotion as directed Topical: Gentamicin 1 x Per Day/30 Days Discharge Instructions: As directed by physician Prim Dressing: KerraCel Ag Gelling Fiber Dressing, 4x5 in (silver alginate) (Generic) 1 x Per Day/30 Days ary Discharge Instructions: Apply silver alginate to wound bed as instructed Secondary  Dressing: ABD Pad, 5x9 (Generic) 1 x Per Day/30 Days Discharge Instructions: Apply over primary dressing as directed. Secondary Dressing: Zetuvit Plus Silicone Border Dressing 5x5 (in/in) (Generic) 1 x Per Day/30 Days Discharge Instructions: Apply silicone border over primary dressing as directed. Secured With: 60M Medipore Public affairs consultant Surgical T 2x10 (in/yd) (Generic) 1 x Per Day/30 Days ape Discharge Instructions: Secure with tape as directed. Com pression Wrap: Kerlix Roll 4.5x3.1 (in/yd) (Generic) 1 x Per Day/30 Days Discharge Instructions: Apply Kerlix and Coban compression as directed. Com pression Wrap: Coban Self-Adherent Wrap 4x5 (in/yd) (Generic) 1 x Per Day/30 Days Discharge Instructions: Apply over Kerlix as directed. WOUND #2: - Foot Wound Laterality: Dorsal, Left Cleanser: Soap and Water 1 x Per Day/30 Days Discharge Instructions: May shower and wash wound with dial antibacterial soap and water prior to dressing change. Peri-Wound Care: Sween Lotion (Moisturizing lotion) 1 x Per Day/30 Days Discharge Instructions: Apply moisturizing lotion as directed Topical: Gentamicin 1 x Per Day/30 Days Discharge Instructions: As directed by physician Prim Dressing: KerraCel Ag Gelling Fiber Dressing, 4x5 in (silver alginate) (Generic) 1 x Per Day/30 Days ary Discharge Instructions: Apply silver alginate to wound bed as instructed Secondary Dressing: ALLEVYN Heel 4 1/2in x 5 1/2in / 10.5cm x 13.5cm (Generic) 1 x Per Day/30 Days BENEN, WEIDA ([id number]) [phone].pdf Page 7 of 7 Discharge Instructions: Apply over primary dressing as directed. Secondary Dressing: Zetuvit Plus Silicone Border Dressing 5x5 (in/in) (Generic) 1 x Per Day/30 Days Discharge Instructions: Apply silicone border over primary dressing as directed. Secured With: 60M Medipore Public affairs consultant Surgical T 2x10 (in/yd) 1 x Per Day/30 Days ape Discharge Instructions: Secure with tape as  directed. Compression Wrap: Kerlix Roll 4.5x3.1 (in/yd) (Generic) 1 x Per Day/30 Days Discharge Instructions: Apply Kerlix and Coban compression as directed. Compression Wrap: Coban Self-Adherent Wrap 4x5 (in/yd) (Generic) 1 x Per Day/30 Days Discharge Instructions: Apply over Kerlix as directed. 1. Left heel  DFU with underlying osteomyelitis [Wagner 3]. He has a more superficial area on the dorsal left foot as well. There has been gradual improvement in the surface area of both wounds today 2. I talked to him about his compliance with knee scooter's which unfortunately is something that many patients overestimate. It is clear he is walking on this for short distances in the house may be most of the time. I also mentioned a total contact cast but he tells me he had 1 a year ago at [name] apparently he developed underlying infection well using this. 3. Otherwise he is tolerating HBO fairly well other than the relative hypoglycemia yesterday that caused me to cancel treating him Electronic Signature(s) Signed: 11/23/2022 3:46:52 PM By: Linton Ham MD Entered By: Linton Ham on 11/23/2022 10:12:01 -------------------------------------------------------------------------------- SuperBill Details Patient Name: Date of Service: [name], [name] 11/23/2022 Medical Record Number: [id number] Patient Account Number: [id number] Date of Birth/Sex: Treating RN: 01-26-1956 (66 y.o. 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

## 2022-11-23 NOTE — Progress Notes (Addendum)
ALVER, LEETE R ([id number]) [id number] Page 1 of 2 Visit Report for 11/23/2022 HBO Details Patient Name: Date of Service: [place], [name] 11/23/2022 10:00 A M Medical Record Number: [id number] Patient Account Number: [id number] Date of Birth/Sex: Treating RN: 01-08-1956 (66 y.o. 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 HBO Treatment Details Treatment Number: 18 Patient Type: Outpatient Chamber Type: Monoplace Chamber Serial #: [id number] Treatment Protocol: 2.0 ATA with 90 minutes oxygen, with two 5 minute air breaks Treatment Details Compression Rate Down: 1.5 psi / minute De-Compression Rate Up: 1.5 psi / minute A breaks and breathing ir Compress Tx Pressure periods Decompress Decompress Begins Reached (leave unused spaces Begins Ends blank) Chamber Pressure (ATA 1 2 2 2 2 2  --2 1 ) Clock Time (24 hr) 10:42 10:58 11:28 11:33 12:03 12:08 - - 12:38 12:55 Treatment Length: 133 (minutes) Treatment Segments: 4 Vital Signs Capillary Blood Glucose Reference Range: 80 - 120 mg / dl HBO Diabetic Blood Glucose Intervention Range: <131 mg/dl or mg/dl Type: Time Vitals Blood Respiratory Capillary Blood Glucose Pulse Action Pulse: Temperature: Taken: Pressure: Rate: Glucose (mg/dl): Meter #: Oximetry (%) Taken: Pre 10:39 142/72 76 18 98.1 141 1 8 oz Glucerna given Post 12:58 155/82 68 18 98 142 1 none per protocol Treatment Response Treatment Toleration: Well Treatment Completion Status: Treatment Completed without Adverse Event Treatment Notes Patient had a wound care encounter  this morning. Patient stated that he did eat. Patient also stated that he self-tested glucose at 202 mg/dL at >093, shown on his device. Measured blood glucose level was 141 mg/dL. Patient was concerned about difference in glucose readings and the appearance of a drop in glucose level. Physician informed and ordered to give patient an 8 oz Glucerna and proceed with treatment. Patient tolerated compression at the rate of 1 psi/min until reaching 10.5 psig at which point rate set was increased to 1.5 psi/min. Patient denied issues with ear equalization. Patient tolerated treatment and subsequent decompression of chamber at a rate of 1.5 psi/min. Post treatment blood glucose level was 142 mg/dL. Patient was stable upon discharge. Physician HBO Attestation: I certify that I supervised this HBO treatment in accordance with Medicare guidelines. A trained emergency response team is readily available per Yes hospital policies and procedures. Continue HBOT as ordered. Yes Electronic Signature(s) Signed: 11/24/2022 2:38:54 PM By: 11/26/2022 EMT Signed: 11/29/2022 7:47:41 AM By: 12/01/2022 MD Previous Signature: 11/23/2022 4:34:47 PM Version By: 14/11/2022 DO Previous Signature: 11/23/2022 4:09:32 PM Version By: 14/11/2022 CHT EMT BS , , Previous Signature: 11/23/2022 4:09:01 PM Version By: 14/11/2022 CHT EMT BS , , Entered By: Haywood Pao on 11/24/2022 14:38:54 11/26/2022 ([name] Page 2 of 2 -------------------------------------------------------------------------------- HBO Safety Checklist Details Patient Name: Date of Service: [place], [name] LD R. 11/23/2022 10:00 A M Medical Record Number: 14/11/2022 Patient Account Number: [id number] Date of Birth/Sex: Treating RN: 05/15/1956 (66 y.o. 78 Primary Care Davi Kroon: [name] Other Clinician: Foye [name] Referring Donnis Phaneuf: Treating Floy Riegler/Extender:  Haywood Pao in Treatment: 10 HBO Safety Checklist Items Safety Checklist Consent Form Signed Patient voided / foley secured and emptied When did you last eato 0000 last night Last dose of  injectable or oral agent yesterday Ostomy pouch emptied and vented if applicable NA All implantable devices assessed, documented and approved NA Intravenous access site secured and place NA Valuables secured Linens and cotton and cotton/polyester blend (less than 51% polyester) Personal oil-based products / skin lotions / body lotions removed Wigs or hairpieces removed NA Smoking or tobacco materials removed NA Books / newspapers / magazines / loose paper removed Cologne, aftershave, perfume and deodorant removed Jewelry removed (may wrap wedding band) Make-up removed NA Hair care products removed Battery operated devices (external) removed Heating patches and chemical warmers removed Titanium eyewear removed Nail polish cured greater than 10 hours NA Casting material cured greater than 10 hours NA Hearing aids removed NA Loose dentures or partials removed dentures removed Prosthetics have been removed NA Patient demonstrates correct use of air break device (if applicable) Patient concerns have been addressed Patient grounding bracelet on and cord attached to chamber Specifics for Inpatients (complete in addition to above) Medication sheet sent with patient NA Intravenous medications needed or due during therapy sent with patient NA Drainage tubes (e.g. nasogastric tube or chest tube secured and vented) NA Endotracheal or Tracheotomy tube secured NA Cuff deflated of air and inflated with saline NA Airway suctioned NA Notes Paper version used prior to treatment. Electronic Signature(s) Signed: 11/24/2022 2:38:27 PM By: [name] EMT Previous Signature: 11/23/2022 4:02:20 PM Version By: Haywood Pao CHT EMT BS , , Entered By: [name] on 11/24/2022 14:38:26

## 2022-11-23 NOTE — Progress Notes (Signed)
TOBENNA, NEEDS R ([id number]) [id number] Page 1 of 11 Visit Report for 11/23/2022 Arrival Information Details Patient Name: Date of Service: [place], [name] 11/23/2022 9:15 A M Medical Record Number: [id number] Patient Account Number: [id number] Date of Birth/Sex: Treating RN: 1956/11/27 (66 y.o. M) Primary Care Chauntae Hults: Foye [name] Other Clinician: Referring Sundy Houchins: Treating Acquanetta Cabanilla/Extender: Little Ishikawa in Treatment: 10 Visit Information History Since Last Visit All ordered tests and consults were completed: No Patient Arrived: Knee Scooter Added or deleted any medications: No Arrival Time: 09:04 Any new allergies or adverse reactions: No Accompanied By: self Had a fall or experienced change in No Transfer Assistance: None activities of daily living that may affect Patient Identification Verified: Yes risk of falls: Secondary Verification Process Completed: Yes Signs or symptoms of abuse/neglect since last visito No Patient Requires Transmission-Based Precautions: No Hospitalized since last visit: No Patient Has Alerts: No Implantable device outside of the clinic excluding No cellular tissue based products placed in the center since last visit: Pain Present Now: No Electronic Signature(s) Signed: 11/23/2022 10:31:29 AM By: Dayton Scrape Entered By: Dayton Scrape on 11/23/2022 09:04:53 -------------------------------------------------------------------------------- Clinic Level of Care Assessment Details Patient Name: Date of Service: [place], [name] 11/23/2022 9:15 A M Medical Record Number: [id number] Patient Account Number: [id number] Date of Birth/Sex: Treating RN: Jun 11, 1956 (66 y.o. [name] Primary Care Camaria Gerald: Foye [name] Other Clinician: Referring Donnavan Covault: Treating Jeanne Diefendorf/Extender: Little Ishikawa in Treatment: 10 Clinic Level of Care Assessment  Items TOOL 4 Quantity Score X- 1 0 Use when only an EandM is performed on FOLLOW-UP visit ASSESSMENTS - Nursing Assessment / Reassessment X- 1 10 Reassessment of Co-morbidities (includes updates in patient status) X- 1 5 Reassessment of Adherence to Treatment Plan ASSESSMENTS - Wound and Skin A ssessment / Reassessment X - Simple Wound Assessment / Reassessment - one wound 1 5  - 0 Complex Wound Assessment / Reassessment - multiple wounds  - 0 Dermatologic / Skin Assessment (not related to wound area) ASSESSMENTS - Focused Assessment  - 0 Circumferential Edema Measurements - multi extremities  - 0 Nutritional Assessment / Counseling / Intervention JAILIN, MOOMAW ([id number]) [id number] Page 2 of 11  - 0 Lower Extremity Assessment (monofilament, tuning fork, pulses)  - 0 Peripheral Arterial Disease Assessment (using hand held doppler) ASSESSMENTS - Ostomy and/or Continence Assessment and Care  - 0 Incontinence Assessment and Management  - 0 Ostomy Care Assessment and Management (repouching, etc.) PROCESS - Coordination of Care X - Simple Patient / Family Education for ongoing care 1 15  - 0 Complex (extensive) Patient / Family Education for ongoing care X- 1 10 Staff obtains Chiropractor, Records, T Results / Process Orders est  - 0 Staff telephones HHA, Nursing Homes / Clarify orders / etc  - 0 Routine Transfer to another Facility (non-emergent condition)  - 0 Routine Hospital Admission (non-emergent condition)  - 0 New Admissions / Manufacturing engineer / Ordering NPWT Apligraf, etc. ,  - 0 Emergency Hospital Admission (emergent condition) X- 1 10 Simple Discharge Coordination  - 0 Complex (extensive) Discharge Coordination PROCESS - Special Needs  - 0 Pediatric / Minor Patient Management  - 0 Isolation Patient Management  - 0 Hearing / Language / Visual special needs  - 0 Assessment of  Community assistance (transportation, D/C planning, etc.)  - 0 Additional assistance / Altered mentation  - 0 Support Surface(s) Assessment (bed, cushion, seat, etc.) INTERVENTIONS - Wound Cleansing / Measurement  - 0  Simple Wound Cleansing - one wound X- 2 5 Complex Wound Cleansing - multiple wounds X- 1 5 Wound Imaging (photographs - any number of wounds) []  - 0 Wound Tracing (instead of photographs) []  - 0 Simple Wound Measurement - one wound X- 2 5 Complex Wound Measurement - multiple wounds INTERVENTIONS - Wound Dressings X - Small Wound Dressing one or multiple wounds 1 10 []  - 0 Medium Wound Dressing one or multiple wounds []  - 0 Large Wound Dressing one or multiple wounds []  - 0 Application of Medications - topical []  - 0 Application of Medications - injection INTERVENTIONS - Miscellaneous []  - 0 External ear exam []  - 0 Specimen Collection (cultures, biopsies, blood, body fluids, etc.) []  - 0 Specimen(s) / Culture(s) sent or taken to Lab for analysis []  - 0 Patient Transfer (multiple staff / / Similar devices) []  - 0 Simple Staple / Suture removal (25 or less) []  - 0 Complex Staple / Suture removal (26 or more) []  - 0 Hypo / Hyperglycemic Management (close monitor of Blood Glucose) MARCE, CHARLESWORTH R (  .pdf Page 3 of 11 []  - 0 Ankle / Brachial Index (ABI) - do not check if billed separately X- 1 5 Vital Signs Has the patient been seen at the hospital within the last three years: Yes Total Score: 95 Level Of Care: New/Established - Level 3 Electronic Signature(s) Signed: 11/23/2022 3:54:33 PM By: Entered By: on 11/23/2022 09:50:39 -------------------------------------------------------------------------------- Encounter Discharge Information Details Patient Name: Date of Service: [place], [name] 11/23/2022 9:15 A M Medical Record Number:  Nurse, adult Patient Account Number: Date of Birth/Sex: Treating RN: 1956/05/04 (66 y.o. Nona Dell Primary Care Daphyne Miguez: [id number] Other Clinician: Referring Maytte Jacot: Treating Eura Mccauslin/Extender: ) [id number] in Treatment: 10 Encounter Discharge Information Items Discharge Condition: Stable Ambulatory Status: Knee Scooter Discharge Destination: Home Transportation: Private Auto Accompanied By: self Schedule Follow-up Appointment: Yes Clinical Summary of Care: Patient Declined Electronic Signature(s) Signed: 11/23/2022 3:54:33 PM By: 14/11/2022 By: Samuella Bruin on 11/23/2022 09:32:23 -------------------------------------------------------------------------------- Lower Extremity Assessment Details Patient Name: Date of Service: [place], [name] NA LD R. 11/23/2022 9:15 A M Medical Record Number: [id number] Patient Account Number: [id number] Date of Birth/Sex: Treating RN: July 21, 1956 (66 y.o. [name] Primary Care Mauro Arps: Foye [name] Other Clinician: Referring Emmarose Klinke: Treating Hillery Bhalla/Extender: Little Ishikawa in Treatment: 10 Edema Assessment Assessed: 14/11/2022: No] Gelene Mink: No] [Left: Edema] [Right: :] Calf Left: Right: Point of Measurement: From Medial Instep 45 cm Ankle Left: Right: Point of Measurement: From Medial Instep 25.5 cm Electronic Signature(s) Signed: 11/23/2022 3:54:33 PM By: 14/11/2022, Stclair 11/23/2022 3:54:33 PM By: [name] Signed14/11/2022 ([id number].pdf Page 4 of 11 aylor Entered By: 07/14/1956 on 11/23/2022 09:23:46 -------------------------------------------------------------------------------- Multi Wound Chart Details Patient Name: Date of Service: [place], Foye [name] Little Ishikawa LD R. 11/23/2022 9:15 A M Medical Record Number: [name] Patient Account Number: 14/11/2022 Date of Birth/Sex: Treating  RN: 1956/11/07 (66 y.o. M) Primary Care Tunisia Landgrebe: Darcella Gasman Other Clinician: Referring Aarilyn Dye: Treating Leilah Polimeni/Extender: Elvera Lennox in Treatment: 10 Vital Signs Height(in): 74 Capillary Blood Glucose(mg/dl): 948546270 Weight(lbs): ) [id number] Pulse(bpm): 73 Body Mass Index(BMI): 31.5 Blood Pressure(mmHg): 156/88 Temperature(F): 97.7 Respiratory Rate(breaths/min): 20 [1:Photos:] [N/A:N/A] Left Calcaneus Left, Dorsal Foot N/A Wound Location: Pressure Injury Pressure Injury N/A Wounding Event: Diabetic Wound/Ulcer of the Lower Diabetic Wound/Ulcer of the Lower N/A Primary Etiology: Extremity Extremity Cataracts, Sleep Apnea, Hypertension, Cataracts, Sleep Apnea, Hypertension, N/A  Comorbid History: Type II Diabetes, Osteomyelitis, Type II Diabetes, Osteomyelitis, Neuropathy Neuropathy 03/09/2019 07/09/2022 N/A Date Acquired: 10 10 N/A Weeks of Treatment: Open Open N/A Wound Status: No No N/A Wound Recurrence: 5.5x4.5x0.3 3x2.7x0.1 N/A Measurements L x W x D (cm) 19.439 6.362 N/A A (cm) : rea 5.832 0.636 N/A Volume (cm) : 10.00% 32.50% N/A % Reduction in A rea: 73.00% 32.50% N/A % Reduction in Volume: 12 Position 1 (o'clock): 1.5 Maximum Distance 1 (cm): Yes No N/A Tunneling: Grade 3 Grade 2 N/A Classification: Medium Medium N/A Exudate A mount: Serosanguineous Serosanguineous N/A Exudate Type: red, brown red, brown N/A Exudate Color: Fibrotic scar, thickened scar Flat and Intact N/A Wound Margin: Large (67-100%) Medium (34-66%) N/A Granulation A mount: Red, Pink Pink N/A Granulation Quality: Small (1-33%) Medium (34-66%) N/A Necrotic A mount: Adherent Slough Eschar, Adherent Slough N/A Necrotic Tissue: Fat Layer (Subcutaneous Tissue): Yes Fat Layer (Subcutaneous Tissue): Yes N/A Exposed Structures: Fascia: No Fascia: No Tendon: No Tendon: No Muscle: No Muscle: No Joint: No Joint: No Bone: No Bone: No Small  (1-33%) Medium (34-66%) N/A Epithelialization: Callus: Yes No Abnormalities Noted N/A Periwound Skin Texture: Maceration: Yes Maceration: Yes N/A Periwound Skin Moisture: Dry/Scaly: No No Abnormalities Noted Erythema: Yes N/A Periwound Skin Color: No Abnormality No Abnormality N/A TemperatureJAMA, KRICHBAUM ([id number]) [id number] Page 5 of 11 Treatment Notes Wound #1 (Calcaneus) Wound Laterality: Left Cleanser Soap and Water Discharge Instruction: May shower and wash wound with dial antibacterial soap and water prior to dressing change. Peri-Wound Care Sween Lotion (Moisturizing lotion) Discharge Instruction: Apply moisturizing lotion as directed Topical Gentamicin Discharge Instruction: As directed by physician Primary Dressing KerraCel Ag Gelling Fiber Dressing, 4x5 in (silver alginate) Discharge Instruction: Apply silver alginate to wound bed as instructed Secondary Dressing ABD Pad, 5x9 Discharge Instruction: Apply over primary dressing as directed. Zetuvit Plus Silicone Border Dressing 5x5 (in/in) Discharge Instruction: Apply silicone border over primary dressing as directed. Secured With [employer] T 2x10 (in/yd) ape Discharge Instruction: Secure with tape as directed. Compression Wrap Kerlix Roll 4.5x3.1 (in/yd) Discharge Instruction: Apply Kerlix and Coban compression as directed. Coban Self-Adherent Wrap 4x5 (in/yd) Discharge Instruction: Apply over Kerlix as directed. Compression Stockings Add-Ons Wound #2 (Foot) Wound Laterality: Dorsal, Left Cleanser Soap and Water Discharge Instruction: May shower and wash wound with dial antibacterial soap and water prior to dressing change. Peri-Wound Care Sween Lotion (Moisturizing lotion) Discharge Instruction: Apply moisturizing lotion as directed Topical Gentamicin Discharge Instruction: As directed by physician Primary Dressing KerraCel Ag Gelling Fiber  Dressing, 4x5 in (silver alginate) Discharge Instruction: Apply silver alginate to wound bed as instructed Secondary Dressing ALLEVYN Heel 4 1/2in x 5 1/2in / 10.5cm x 13.5cm Discharge Instruction: Apply over primary dressing as directed. Zetuvit Plus Silicone Border Dressing 5x5 (in/in) Discharge Instruction: Apply silicone border over primary dressing as directed. Secured With [employer] T 2x10 (in/yd) ape Discharge Instruction: Secure with tape as directed. Compression Wrap Kerlix Roll 4.5x3.1 (in/yd) Discharge Instruction: Apply Kerlix and Coban compression as directed. Coban Self-Adherent Wrap 4x5 (in/yd) Discharge Instruction: Apply over Kerlix as directed. ASWAD, WANDREY R ([id number]) [id number] Page 6 of 11 Compression Stockings Add-Ons Electronic Signature(s) Signed: 11/23/2022 3:46:52 PM By: Baltazar Najjar MD Entered By: Baltazar Najjar on 11/23/2022 09:47:25 -------------------------------------------------------------------------------- Multi-Disciplinary Care Plan Details Patient Name: Date of Service: [place], [name] 11/23/2022 9:15 A M Medical Record Number: [id number] Patient Account Number: [id number] Date of Birth/Sex: Treating RN: [birth year] (66 y.o. 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 and treatment procedures Potential for barotraumas to ears, sinuses, teeth, and lungs or cerebral gas embolism related to changes in atmospheric pressure inside hyperbaric oxygen chamber Potential for oxygen toxicity seizures related to delivery of 100% oxygen at an increased atmospheric pressure Potential for pulmonary oxygen  toxicity related to delivery of 100% oxygen at an increased atmospheric pressure Goals: Barotrauma will be prevented during HBO2 Date Initiated: 10/06/2022 Target Resolution Date: 01/14/2023 Goal Status: Active Patient and/or family will be able to state/discuss factors appropriate to the management of their disease process during treatment Date Initiated: 10/06/2022 Target Resolution Date: 01/14/2023 Goal Status: Active Patient will tolerate the hyperbaric oxygen therapy treatment Date Initiated: 10/06/2022 Target Resolution Date: 01/14/2023 Goal Status: Active Patient/caregiver will verbalize understanding of HBO goals, rationale, procedures and potential hazards Date Initiated: 10/06/2022 Target Resolution Date: 01/14/2023 Goal Status: Active Interventions: Administer decongestants, per physician orders, prior to HBO2 Administer the correct therapeutic gas delivery based on the patients needs and limitations, per physician order Assess and provide for patients comfort related to the hyperbaric environment and equalization of middle ear Assess for signs and symptoms related to adverse events, including but not limited to confinement anxiety, pneumothorax, oxygen toxicity and baurotrauma Notes: Wound/Skin Impairment Nursing Diagnoses: Impaired tissue integrity Goals: Patient/caregiver will verbalize understanding of skin care regimen Date Initiated: 10/06/2022 Target Resolution Date: 01/14/2023 Goal Status: Active Ulcer/skin breakdown will have a volume reduction of 30% by week 4 Date Initiated: 09/10/2022 Date Inactivated: 10/06/2022 Target Resolution Date: 10/08/2022 Goal Status: Unmet Unmet Reason: Lanetta Inch HBOT RAIDON, SWANNER ([id number]) [phone].pdf Page 7 of 11 Ulcer/skin breakdown will have a volume reduction of 50% by week 8 Date Initiated: 10/06/2022 Target Resolution Date: 01/14/2023 Goal Status: Active Interventions: Assess ulceration(s) every  visit Provide education on ulcer and skin care Treatment Activities: Consult for HBO : 09/10/2022 Skin care regimen initiated : 09/10/2022 Notes: Electronic Signature(s) Signed: 11/23/2022 3:54:33 PM By: Samuella Bruin Entered By: Samuella Bruin on 11/23/2022 09:25:10 -------------------------------------------------------------------------------- Pain Assessment Details Patient Name: Date of Service: [place], [name] 11/23/2022 9:15 A M Medical Record Number: [id number] Patient Account Number: [id number] Date of Birth/Sex: Treating RN: January 09, 1956 (66 y.o. M) Primary Care Maliyah Willets: Foye [name] Other Clinician: Referring Kayah Hecker: Treating Melissa Tomaselli/Extender: Little Ishikawa in Treatment: 10 Active Problems Location of Pain Severity and Description of Pain Patient Has Paino No Site Locations Pain Management and Medication Current Pain Management: Electronic Signature(s) Signed: 11/23/2022 10:31:29 AM By: Dayton Scrape Entered By: Dayton Scrape on 11/23/2022 09:05:28 [name] ([id number]) [id number] Page 8 of 11 -------------------------------------------------------------------------------- Patient/Caregiver Education Details Patient Name: Date of Service: [place], [name] 12/12/2023andnbsp9:15 A M Medical Record Number: [id number] Patient Account Number: [id number] Date of Birth/Gender: Treating RN: 06-Nov-1956 (66 y.o. [name] Primary Care Physician: Foye [name] Other Clinician: Referring Physician: Treating Physician/Extender: Little Ishikawa in Treatment: 10 Education Assessment Education Provided To: Patient Education Topics Provided Wound/Skin Impairment: Methods: Explain/Verbal Responses: Reinforcements needed, State content correctly Electronic Signature(s) Signed: 11/23/2022 3:54:33 PM By: Samuella Bruin Entered By: Samuella Bruin on 11/23/2022  09:31:54 -------------------------------------------------------------------------------- Wound Assessment Details Patient Name: Date of Service: [place], [name] 11/23/2022 9:15 A M Medical Record Number: [id number] Patient Account Number: [id number] Date of Birth/Sex: Treating  RN: May 20, 1956 (66 y.o. M) Primary Care Esmeralda Blanford: Foye DeerSchultz, Douglas Other Clinician: Referring Zynasia Burklow: Treating Kymere Fullington/Extender: Little Ishikawaobson, Michael Schultz, Douglas Weeks in Treatment: 10 Wound Status Wound Number: 1 Primary Diabetic Wound/Ulcer of the Lower Extremity Etiology: Wound Location: Left Calcaneus Wound Open Wounding Event: Pressure Injury Status: Date Acquired: 03/09/2019 Comorbid Cataracts, Sleep Apnea, Hypertension, Type II Diabetes, Weeks Of Treatment: 10 History: Osteomyelitis, Neuropathy Clustered Wound: No Photos Wound Measurements Length: (cm) 5.5 Width: (cm) 4.5 Depth: (cm) 0.3 Area: (cm) 19.439 Volume: (cm) 5.832 % Reduction in Area: 10% % Reduction in Volume: 73% Epithelialization: Small (1-33%) Tunneling: Yes Position (o'clock): 12 Maximum Distance: (cm) 1.5 Undermining: No Vernice JeffersonSMITH, Jonathandavid R ([id number]) [id number] Page 9 of 11 Wound Description Classification: Grade 3 Wound Margin: Fibrotic scar, thickened scar Exudate Amount: Medium Exudate Type: Serosanguineous Exudate Color: red, brown Foul Odor After Cleansing: No Slough/Fibrino Yes Wound Bed Granulation Amount: Large (67-100%) Exposed Structure Granulation Quality: Red, Pink Fascia Exposed: No Necrotic Amount: Small (1-33%) Fat Layer (Subcutaneous Tissue) Exposed: Yes Necrotic Quality: Adherent Slough Tendon Exposed: No Muscle Exposed: No Joint Exposed: No Bone Exposed: No Periwound Skin Texture Texture Color No Abnormalities Noted: No No Abnormalities Noted: Yes Callus: Yes Temperature / Pain Temperature: No Abnormality Moisture No Abnormalities Noted: No Dry / Scaly:  No Maceration: Yes Treatment Notes Wound #1 (Calcaneus) Wound Laterality: Left Cleanser Soap and Water Discharge Instruction: May shower and wash wound with dial antibacterial soap and water prior to dressing change. Peri-Wound Care Sween Lotion (Moisturizing lotion) Discharge Instruction: Apply moisturizing lotion as directed Topical Gentamicin Discharge Instruction: As directed by physician Primary Dressing KerraCel Ag Gelling Fiber Dressing, 4x5 in (silver alginate) Discharge Instruction: Apply silver alginate to wound bed as instructed Secondary Dressing ABD Pad, 5x9 Discharge Instruction: Apply over primary dressing as directed. Zetuvit Plus Silicone Border Dressing 5x5 (in/in) Discharge Instruction: Apply silicone border over primary dressing as directed. Secured With [employer] Soft Cloth Surgical T 2x10 (in/yd) ape Discharge Instruction: Secure with tape as directed. Compression Wrap Kerlix Roll 4.5x3.1 (in/yd) Discharge Instruction: Apply Kerlix and Coban compression as directed. Coban Self-Adherent Wrap 4x5 (in/yd) Discharge Instruction: Apply over Kerlix as directed. Compression Stockings Add-Ons Electronic Signature(s) Signed: 11/23/2022 3:54:33 PM By: Samuella BruinHerrington, Taylor Entered By: Samuella BruinHerrington, Taylor on 11/23/2022 09:31:35 Vernice JeffersonSMITH, Carter R ([id number]) [id number] Page 10 of 11 -------------------------------------------------------------------------------- Wound Assessment Details Patient Name: Date of Service: [place] LD R. 11/23/2022 9:15 A M Medical Record Number: [id number] Patient Account Number: [id number] Date of Birth/Sex: Treating RN: May 20, 1956 (66 y.o. M) Primary Care Victor Langenbach: Foye DeerSchultz, Douglas Other Clinician: Referring Janyla Biscoe: Treating Michiel Sivley/Extender: Little Ishikawaobson, Michael Schultz, Douglas Weeks in Treatment: 10 Wound Status Wound Number: 2 Primary Diabetic Wound/Ulcer of the Lower Extremity Etiology: Wound  Location: Left, Dorsal Foot Wound Open Wounding Event: Pressure Injury Status: Date Acquired: 07/09/2022 Comorbid Cataracts, Sleep Apnea, Hypertension, Type II Diabetes, Weeks Of Treatment: 10 History: Osteomyelitis, Neuropathy Clustered Wound: No Photos Wound Measurements Length: (cm) 3 Width: (cm) 2.7 Depth: (cm) 0.1 Area: (cm) 6.362 Volume: (cm) 0.636 % Reduction in Area: 32.5% % Reduction in Volume: 32.5% Epithelialization: Medium (34-66%) Tunneling: No Undermining: No Wound Description Classification: Grade 2 Wound Margin: Flat and Intact Exudate Amount: Medium Exudate Type: Serosanguineous Exudate Color: red, brown Foul Odor After Cleansing: No Slough/Fibrino Yes Wound Bed Granulation Amount: Medium (34-66%) Exposed Structure Granulation Quality: Pink Fascia Exposed: No Necrotic Amount: Medium (34-66%) Fat Layer (Subcutaneous Tissue) Exposed: Yes Necrotic Quality: Eschar, Adherent Slough Tendon Exposed: No Muscle Exposed: No Joint Exposed: No Bone Exposed: No  Periwound Skin Texture Texture Color No Abnormalities Noted: Yes No Abnormalities Noted: Yes Moisture Temperature / Pain No Abnormalities Noted: Yes Temperature: No Abnormality Treatment Notes Wound #2 (Foot) Wound Laterality: Dorsal, Left Cleanser Soap and Water Discharge Instruction: May shower and wash wound with dial antibacterial soap and water prior to dressing change. PILAR, CORRALES R ([id number]) [id number] Page 11 of 11 Peri-Wound Care Sween Lotion (Moisturizing lotion) Discharge Instruction: Apply moisturizing lotion as directed Topical Gentamicin Discharge Instruction: As directed by physician Primary Dressing KerraCel Ag Gelling Fiber Dressing, 4x5 in (silver alginate) Discharge Instruction: Apply silver alginate to wound bed as instructed Secondary Dressing ALLEVYN Heel 4 1/2in x 5 1/2in / 10.5cm x 13.5cm Discharge Instruction: Apply over primary  dressing as directed. Zetuvit Plus Silicone Border Dressing 5x5 (in/in) Discharge Instruction: Apply silicone border over primary dressing as directed. Secured With [employer] T 2x10 (in/yd) ape Discharge Instruction: Secure with tape as directed. Compression Wrap Kerlix Roll 4.5x3.1 (in/yd) Discharge Instruction: Apply Kerlix and Coban compression as directed. Coban Self-Adherent Wrap 4x5 (in/yd) Discharge Instruction: Apply over Kerlix as directed. Compression Stockings Add-Ons Electronic Signature(s) Signed: 11/23/2022 3:54:33 PM By: Samuella Bruin Entered By: Samuella Bruin on 11/23/2022 09:24:33 -------------------------------------------------------------------------------- Vitals Details Patient Name: Date of Service: [name], [name] 11/23/2022 9:15 A M Medical Record Number: [id number] Patient Account Number: [id number] Date of Birth/Sex: Treating RN: April 02, 1956 (66 y.o. M) Primary Care Emilyn Ruble: Foye [name] Other Clinician: Referring Alantis Bethune: Treating Imelda Dandridge/Extender: Little Ishikawa in Treatment: 10 Vital Signs Time Taken: 09:00 Temperature (F): 97.7 Height (in): 74 Pulse (bpm): 73 Weight (lbs): 245 Respiratory Rate (breaths/min): 20 Body Mass Index (BMI): 31.5 Blood Pressure (mmHg): 156/88 Capillary Blood Glucose (mg/dl): 626 Reference Range: 80 - 120 mg / dl Electronic Signature(s) Signed: 11/23/2022 10:31:29 AM By: Dayton Scrape Entered By: Dayton Scrape on 11/23/2022 09:05:21

## 2022-11-23 NOTE — Progress Notes (Signed)
ELIAH, MARQUARD R ([id number]) [id number] Page 1 of 2 Visit Report for 11/23/2022 Arrival Information Details Patient Name: Date of Service: [place], [name] 11/23/2022 10:00 A M Medical Record Number: [id number] Patient Account Number: [id number] Date of Birth/Sex: Treating RN: [birth year] (66 y.o. Dwaine Deter, Jamie Primary Care Gaudencio Chesnut: Foye [name] Other Clinician: Haywood Pao Referring Duc Crocket: Treating Pearlene Teat/Extender: Little Ishikawa in Treatment: 10 Visit Information History Since Last Visit All ordered tests and consults were completed: Yes Patient Arrived: Knee Scooter Added or deleted any medications: No Arrival Time: 08:57 Any new allergies or adverse reactions: No Accompanied By: self Had a fall or experienced change in No Transfer Assistance: None activities of daily living that may affect Patient Identification Verified: Yes risk of falls: Secondary Verification Process Completed: Yes Signs or symptoms of abuse/neglect since last visito No Patient Requires Transmission-Based Precautions: No Hospitalized since last visit: No Patient Has Alerts: No Implantable device outside of the clinic excluding No cellular tissue based products placed in the center since last visit: Pain Present Now: No Electronic Signature(s) Signed: 11/24/2022 2:37:35 PM By: [name] EMT Previous Signature: 11/23/2022 3:56:49 PM Version By: Haywood Pao CHT EMT BS , , Entered By: [name] on 11/24/2022 14:37:35 -------------------------------------------------------------------------------- Encounter Discharge Information Details Patient Name: Date of Service: [place], [name] 11/23/2022 10:00 A M Medical Record Number: [id number] Patient Account Number: [id number] Date of Birth/Sex: Treating RN: Apr 20, 1956 (66 y.o. [name] Primary Care Jisel Fleet: Foye [name] Other Clinician: Haywood Pao Referring Carney Saxton: Treating Nevena Rozenberg/Extender: Little Ishikawa in Treatment: 10 Encounter Discharge Information Items Discharge Condition: Stable Ambulatory Status: Knee Scooter Discharge Destination: Home Transportation: Private Auto Accompanied By: self Schedule Follow-up Appointment: No Clinical Summary of Care: Electronic Signature(s) Signed: 11/24/2022 2:39:52 PM By: [name] EMT Previous Signature: 11/23/2022 4:11:11 PM Version By: Haywood Pao CHT EMT BS , , Entered By: [name] on 11/24/2022 14:39:52 Nona Dell R ([id number]) [id number] Page 2 of 2 -------------------------------------------------------------------------------- Vitals Details Patient Name: Date of Service: [place], [name] 11/23/2022 10:00 A M Medical Record Number: [id number] Patient Account Number: [id number] Date of Birth/Sex: Treating RN: Mar 09, 1956 (66 y.o. [name] Primary Care Jansel Vonstein: Foye [name] Other Clinician: Haywood Pao Referring Cheresa Siers: Treating Lucine Bilski/Extender: Little Ishikawa in Treatment: 10 Vital Signs Time Taken: 10:39 Temperature (F): 98.1 Height (in): 74 Pulse (bpm): 76 Weight (lbs): 245 Respiratory Rate (breaths/min): 18 Body Mass Index (BMI): 31.5 Blood Pressure (mmHg): 142/72 Capillary Blood Glucose (mg/dl): 716 Reference Range: 80 - 120 mg / dl Electronic Signature(s) Signed: 11/24/2022 2:38:13 PM By: [name] EMT Previous Signature: 11/23/2022 3:57:19 PM Version By: Haywood Pao CHT EMT BS , , Entered By: [name] on 11/24/2022 14:38:12

## 2022-11-23 NOTE — Progress Notes (Signed)
BRUNO, LEACH R ([id number]) [id number] Page 1 of 1 Visit Report for 11/22/2022 SuperBill Details Patient Name: Date of Service: [place], [name] 11/22/2022 Medical Record Number: [id number] Patient Account Number: [id number] Date of Birth/Sex: Treating RN: Oct 03, 1956 (66 y.o. 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

## 2022-11-24 ENCOUNTER — Encounter (HOSPITAL_BASED_OUTPATIENT_CLINIC_OR_DEPARTMENT_OTHER): Payer: Medicare Other | Admitting: Internal Medicine

## 2022-11-24 DIAGNOSIS — E11621 Type 2 diabetes mellitus with foot ulcer: Secondary | ICD-10-CM | POA: Diagnosis not present

## 2022-11-24 LAB — GLUCOSE, CAPILLARY
Glucose-Capillary: 142 mg/dL — ABNORMAL HIGH (ref 70–99)
Glucose-Capillary: 273 mg/dL — ABNORMAL HIGH (ref 70–99)
Glucose-Capillary: 191 mg/dL — ABNORMAL HIGH (ref 70–99)
Glucose-Capillary: 158 mg/dL — ABNORMAL HIGH (ref 70–99)

## 2022-11-24 NOTE — Progress Notes (Signed)
[name], [name] [id number] Page 1 of 2 Visit Report for 11/24/2022 Arrival Information Details Patient Name: Date of Service: [name], [name]. 11/24/2022 10:00 A M Medical Record Number: [id number] Patient Account Number: [id number] Date of Birth/Sex: Treating RN: 1956/03/17 (66 y.o. [name], [name] Primary Care Caci Orren: Foye [name] Other Clinician: Haywood Pao Referring Anubis Fundora: Treating Holley Wirt/Extender: Little Ishikawa in Treatment: 10 Visit Information History Since Last Visit All ordered tests and consults were completed: Yes Patient Arrived: Knee Scooter Added or deleted any medications: No Arrival Time: 10:19 Any new allergies or adverse reactions: No Accompanied By: self Had a fall or experienced change in No Transfer Assistance: None activities of daily living that may affect Patient Identification Verified: Yes risk of falls: Secondary Verification Process Completed: Yes Signs or symptoms of abuse/neglect since last visito No Patient Requires Transmission-Based Precautions: No Hospitalized since last visit: No Patient Has Alerts: No Implantable device outside of the clinic excluding No cellular tissue based products placed in the center since last visit: Pain Present Now: No Electronic Signature(s) Signed: 11/24/2022 3:13:49 PM By: Haywood Pao CHT EMT BS , , Entered By: Haywood Pao on 11/24/2022 15:13:49 -------------------------------------------------------------------------------- Encounter Discharge Information Details Patient Name: Date of Service: [name], [name]. 11/24/2022 10:00 A M Medical Record Number: [id number] Patient Account Number: [id number] Date of Birth/Sex: Treating RN: May 16, 1956 (66 y.o. Tammy Sours Primary Care Keithon Mccoin: Foye [name] Other Clinician: Haywood Pao Referring Marleny Faller: Treating Sheyla Zaffino/Extender: Little Ishikawa in Treatment: 10 Encounter Discharge Information Items Discharge Condition: Stable Ambulatory Status: Knee Scooter Discharge Destination: Home Transportation: Private Auto Accompanied By: self Schedule Follow-up Appointment: No Clinical Summary of Care: Electronic Signature(s) Signed: 11/24/2022 3:21:36 PM By: Haywood Pao CHT EMT BS , , Entered By: Haywood Pao on 11/24/2022 15:21:36 [name] ([id number]) [id number] Page 2 of 2 -------------------------------------------------------------------------------- Vitals Details Patient Name: Date of Service: [name], [name]. 11/24/2022 10:00 A M Medical Record Number: [id number] Patient Account Number: [id number] Date of Birth/Sex: Treating RN: [birth year] (66 y.o. [name], [name] Primary Care Hadden Steig: Foye [name] Other Clinician: Haywood Pao Referring Cherysh Epperly: Treating Keldric Poyer/Extender: Little Ishikawa in Treatment: 10 Vital Signs Time Taken: 10:27 Temperature (F): 98.6 Height (in): 74 Pulse (bpm): 74 Weight (lbs): 245 Respiratory Rate (breaths/min): 18 Body Mass Index (BMI): 31.5 Blood Pressure (mmHg): 142/71 Capillary Blood Glucose (mg/dl): 196 Reference Range: 80 - 120 mg / dl Electronic Signature(s) Signed: 11/24/2022 3:14:31 PM By: Haywood Pao CHT EMT BS , , Entered By: Haywood Pao on 11/24/2022 15:14:31

## 2022-11-24 NOTE — Progress Notes (Addendum)
[name], [name] ([id number]) [id number] Page 1 of 2 Visit Report for 11/23/2022 Problem List Details Patient Name: Date of Service: [place], [name] 11/23/2022 10:00 A M Medical Record Number: [id number] Patient Account Number: [id number] Date of Birth/Sex: Treating RN: [birth year] (66 y.o. 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 11/23/2022 Medical Record Number: [id number] Patient Account Number: [id number] Date of Birth/Sex: Treating RN: [birth year] (66 y.o. [name] Primary Care Provider: Foye [name] Other Clinician: Haywood Pao Referring Provider: Treating Provider/Extender: Little Ishikawa in Treatment: [address] 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, left ankle and foot E11.65 Type 2 diabetes mellitus with hyperglycemia E11.621 Type 2 diabetes mellitus with foot ulcer Facility Procedures : The patient participates with Medicare or their insurance follows the Medicare Facility Guidelines: CPT4 Code Description Modifier Quantity [id number] G0277-(Facility Use Only) HBOT full body chamber, , 4 ICD-10 Diagnosis Description [phone] Other  chronic osteomyelitis, 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 2 diabetes mellitus with foot  ulcer Physician Procedures : CPT4 Code Description Modifier [id number] [phone] - WC PHYS HYPERBARIC OXYGEN THERAPY ICD-10 Diagnosis Description 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

## 2022-11-24 NOTE — Progress Notes (Signed)
[name], [name] ([id number]) [id number] Page 1 of 2 Visit Report for 11/24/2022 HBO Details Patient Name: Date of Service: [name], [name]. 11/24/2022 10:00 A M Medical Record Number: [id number] Patient Account Number: [id number] Date of Birth/Sex: Treating RN: 06-12-1956 (66 y.o. 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 HBO Treatment Details Treatment Number: 19 Patient Type: Outpatient Chamber Type: Monoplace Chamber Serial #: [id number] Treatment Protocol: 2.0 ATA with 90 minutes oxygen, with two 5 minute air breaks Treatment Details Compression Rate Down: 1.5 psi / minute De-Compression Rate Up: 1.5 psi / minute A breaks and breathing ir Compress Tx Pressure periods Decompress Decompress Begins Reached (leave unused spaces Begins Ends blank) Chamber Pressure (ATA 1 2 2 2 2 2  --2 1 ) Clock Time (24 hr) 11:03 11:23 11:53 11:58 12:28 12:33 - - 13:03 13:15 Treatment Length: 132 (minutes) Treatment Segments: 4 Vital Signs Capillary Blood Glucose Reference Range: 80 - 120 mg / dl HBO Diabetic Blood Glucose Intervention Range: <131 mg/dl or mg/dl Type: Time Vitals Blood Respiratory Capillary Blood Glucose Pulse Action Pulse: Temperature: Taken: Pressure: Rate: Glucose (mg/dl): Meter #: Oximetry (%) Taken: Pre 10:27 142/71 74 18 98.6 273 1 none per protocol Post 13:18 167/81 66 18 98.8 158 1 none per protocol Treatment Response Treatment Toleration: Well Treatment Completion Status: Treatment Completed without Adverse Event Treatment Notes Patient arrived for treatment and  vital signs were taken. Mr. Kersh explained that his glucose was high this morning and self-administered 8 units of Novolog prior to arriving. Initial blood glucose measurement was 273 mg/dL. Dr. [name] was made aware. All other vital signs were within range of protocols. For safety blood glucose was measured again prior to treatment resulting in 191 mg/dL. Patient was given 8 oz Glucerna. After completing safety check, patient was placed in the chamber and compression of the chamber proceeded at 1 psi/min until reaching 8 psig at which time rate set was increased to 1.5 psi/min until reaching treatment depth. Patient tolerated treatment well and tolerated decompression at the rate of 1.5 psi/min. Post treatment glucose reading was 158 mg/dL. Patient was stable upon discharge. Merrilyn Legler Notes No concerns with treatment given. The patient came in with a blood sugar of 273 he had taken 8 units of NovoLog for this stating it was over 300 when he was at home. I thought he was okay to dive but as it turns out he had a post dive treatment of 191. Physician HBO Attestation: I certify that I supervised this HBO treatment in accordance with Medicare guidelines. A trained emergency response team is readily available per Yes hospital policies and procedures. Continue HBOT as ordered. Yes Electronic Signature(s) Signed: 11/24/2022 5:47:10 PM By: 11/26/2022 MD Previous Signature: 11/24/2022 3:42:56 PM Version By: 11/26/2022 CHT EMT BS , , Previous Signature: 11/24/2022 3:20:50 PM Version By: 11/26/2022 CHT EMT BS , , Uvalda, Bazine [name] ([name]) [id number] Page 2 of 2 Previous Signature: 11/24/2022 3:20:50 PM Version By: 11/26/2022 CHT EMT BS , , Entered By: Haywood Pao on 11/24/2022 17:33:36 -------------------------------------------------------------------------------- HBO Safety Checklist Details Patient Name: Date of Service: [name], [name].  11/24/2022 10:00 A M Medical Record Number: 11/26/2022 Patient Account Number: [id number] Date of  Birth/Sex: Treating RN: 08/30/1956 (66 y.o. [name], [name] Primary Care Rafel Garde: Foye [name] Other Clinician: Haywood Pao Referring Nowell Sites: Treating Wannetta Langland/Extender: Little Ishikawa in Treatment: 10 HBO Safety Checklist Items Safety Checklist Consent Form Signed Patient voided / foley secured and emptied When did you last eato Yesterday evening Last dose of injectable or oral agent 8 Units "45 minutes ago" Ostomy pouch emptied and vented if applicable NA All implantable devices assessed, documented and approved NA Intravenous access site secured and place NA Valuables secured Linens and cotton and cotton/polyester blend (less than 51% polyester) Personal oil-based products / skin lotions / body lotions removed Wigs or hairpieces removed NA Smoking or tobacco materials removed NA Books / newspapers / magazines / loose paper removed Cologne, aftershave, perfume and deodorant removed Jewelry removed (may wrap wedding band) Make-up removed NA Hair care products removed Battery operated devices (external) removed Heating patches and chemical warmers removed Titanium eyewear removed Nail polish cured greater than 10 hours NA Casting material cured greater than 10 hours NA Hearing aids removed NA Loose dentures or partials removed NA Prosthetics have been removed NA Patient demonstrates correct use of air break device (if applicable) Patient concerns have been addressed Patient grounding bracelet on and cord attached to chamber Specifics for Inpatients (complete in addition to above) Medication sheet sent with patient NA Intravenous medications needed or due during therapy sent with patient NA Drainage tubes (e.g. nasogastric tube or chest tube secured and vented) NA Endotracheal or Tracheotomy tube secured NA Cuff deflated of  air and inflated with saline NA Airway suctioned NA Notes Paper version used prior to treatment. Electronic Signature(s) Signed: 11/24/2022 3:19:18 PM By: Haywood Pao CHT EMT BS , , Entered By: Haywood Pao on 11/24/2022 15:19:18

## 2022-11-25 ENCOUNTER — Encounter (HOSPITAL_BASED_OUTPATIENT_CLINIC_OR_DEPARTMENT_OTHER): Payer: Medicare Other | Admitting: Internal Medicine

## 2022-11-25 DIAGNOSIS — E11621 Type 2 diabetes mellitus with foot ulcer: Secondary | ICD-10-CM | POA: Diagnosis not present

## 2022-11-25 LAB — GLUCOSE, CAPILLARY
Glucose-Capillary: 326 mg/dL — ABNORMAL HIGH (ref 70–99)
Glucose-Capillary: 335 mg/dL — ABNORMAL HIGH (ref 70–99)
Glucose-Capillary: 279 mg/dL — ABNORMAL HIGH (ref 70–99)

## 2022-11-25 NOTE — Progress Notes (Signed)
ALY, HAUSER R ([id number]) [id number] Page 1 of 2 Visit Report for 11/25/2022 Arrival Information Details Patient Name: Date of Service: [place], [name] [name] LD R. 11/25/2022 10:00 A M Medical Record Number: [id number] Patient Account Number: [id number] Date of Birth/Sex: Treating RN: Dec 23, 1955 (66 y.o. [name], Lauren Primary Care Masae Lukacs: Foye [name] Other Clinician: Referring Omolola Mittman: Treating Latonya Nelon/Extender: Little Ishikawa in Treatment: 10 Visit Information History Since Last Visit Added or deleted any medications: No Patient Arrived: Ambulatory Any new allergies or adverse reactions: No Arrival Time: 11:59 Had a fall or experienced change in No Accompanied By: self activities of daily living that may affect Transfer Assistance: None risk of falls: Patient Identification Verified: Yes Signs or symptoms of abuse/neglect since last visito No Secondary Verification Process Completed: Yes Hospitalized since last visit: No Patient Requires Transmission-Based Precautions: No Implantable device outside of the clinic excluding No Patient Has Alerts: No cellular tissue based products placed in the center since last visit: Has Dressing in Place as Prescribed: Yes Pain Present Now: No Electronic Signature(s) Signed: 11/25/2022 5:16:09 PM By: Fonnie Mu RN Entered By: Fonnie Mu on 11/25/2022 12:00:00 -------------------------------------------------------------------------------- Encounter Discharge Information Details Patient Name: Date of Service: [place], [name] 11/25/2022 10:00 A M Medical Record Number: [id number] Patient Account Number: [id number] Date of Birth/Sex: Treating RN: June 10, 1956 (66 y.o. [name], Lauren Primary Care Arvis Miguez: Foye [name] Other Clinician: Haywood Pao Referring Aqueelah Cotrell: Treating Savon Cobbs/Extender: Little Ishikawa in Treatment:  10 Encounter Discharge Information Items Discharge Condition: Stable Ambulatory Status: Knee Scooter Discharge Destination: Home Transportation: Private Auto Accompanied By: self Schedule Follow-up Appointment: No Clinical Summary of Care: Electronic Signature(s) Signed: 11/25/2022 2:38:51 PM By: Haywood Pao CHT EMT BS , , Entered By: Haywood Pao on 11/25/2022 14:38:51 [name] ([id number]) [id number] Page 2 of 2 -------------------------------------------------------------------------------- Vitals Details Patient Name: Date of Service: [place], [name] [name] LD R. 11/25/2022 10:00 A M Medical Record Number: [id number] Patient Account Number: [id number] Date of Birth/Sex: Treating RN: [birth year] (66 y.o. [name], Lauren Primary Care Evone Arseneau: Foye [name] Other Clinician: Referring Jahlen Bollman: Treating Keilen Kahl/Extender: Little Ishikawa in Treatment: 10 Vital Signs Time Taken: 10:16 Temperature (F): 98.6 Height (in): 74 Pulse (bpm): 66 Weight (lbs): 245 Respiratory Rate (breaths/min): 17 Body Mass Index (BMI): 31.5 Blood Pressure (mmHg): 148/87 Capillary Blood Glucose (mg/dl): 614 Reference Range: 80 - 120 mg / dl Electronic Signature(s) Signed: 11/25/2022 5:16:09 PM By: Fonnie Mu RN Entered By: Fonnie Mu on 11/25/2022 12:00:38

## 2022-11-25 NOTE — Progress Notes (Signed)
TIMOFEY, CARANDANG R ([id number]) [id number] Page 1 of 1 Visit Report for 11/24/2022 SuperBill Details Patient Name: Date of Service: [name], [name] [name] 11/24/2022 Medical Record Number: [id number] Patient Account Number: [id number] Date of Birth/Sex: Treating RN: 08/08/1956 (66 y.o. 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, left ankle and foot E11.65 Type 2 diabetes mellitus with hyperglycemia E11.621 Type 2 diabetes mellitus with foot ulcer Facility Procedures The patient participates with Medicare or their insurance follows the Medicare Facility Guidelines CPT4 Code Description Modifier Quantity [phone] G0277-(Facility Use Only) HBOT full body chamber, , 4 ICD-10 Diagnosis Description [phone] Other chronic osteomyelitis, 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 Physician Procedures Quantity CPT4 Code Description Modifier [id number] - WC PHYS HYPERBARIC OXYGEN THERAPY 1 ICD-10 Diagnosis Description 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

## 2022-11-26 ENCOUNTER — Encounter (HOSPITAL_BASED_OUTPATIENT_CLINIC_OR_DEPARTMENT_OTHER): Payer: Medicare Other | Admitting: Internal Medicine

## 2022-11-26 DIAGNOSIS — E11621 Type 2 diabetes mellitus with foot ulcer: Secondary | ICD-10-CM | POA: Diagnosis not present

## 2022-11-26 LAB — GLUCOSE, CAPILLARY
Glucose-Capillary: 194 mg/dL — ABNORMAL HIGH (ref 70–99)
Glucose-Capillary: 221 mg/dL — ABNORMAL HIGH (ref 70–99)
Glucose-Capillary: 182 mg/dL — ABNORMAL HIGH (ref 70–99)

## 2022-11-26 NOTE — Progress Notes (Addendum)
ATLEY, NEUBERT [name] ([id number]) [id number] Page 1 of 2 Visit Report for 11/26/2022 HBO Details Patient Name: Date of Service: [name], [name]. 11/26/2022 10:00 A M Medical Record Number: [id number] Patient Account Number: [id number] Date of Birth/Sex: Treating RN: 1956/05/17 (66 y.o. 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 HBO Treatment Details Treatment Number: 21 Patient Type: Outpatient Chamber Type: Monoplace Chamber Serial #: [id number] Treatment Protocol: 2.0 ATA with 90 minutes oxygen, with two 5 minute air breaks Treatment Details Compression Rate Down: 1.5 psi / minute De-Compression Rate Up: 2.0 psi / minute A breaks and breathing ir Compress Tx Pressure periods Decompress Decompress Begins Reached (leave unused spaces Begins Ends blank) Chamber Pressure (ATA 1 2 2 2 2 2  --2 1 ) Clock Time (24 hr) 10:43 11:00 11:30 11:35 12:05 12:10 - - 12:40 12:51 Treatment Length: 128 (minutes) Treatment Segments: 4 Vital Signs Capillary Blood Glucose Reference Range: 80 - 120 mg / dl HBO Diabetic Blood Glucose Intervention Range: <131 mg/dl or mg/dl Type: Time Vitals Blood Pulse: Respiratory Temperature: Capillary Blood Glucose Pulse Action Taken: Pressure: Rate: Glucose (mg/dl): Meter #: Oximetry (%) Taken: Pre 10:37 130/75 75 20 98.3 194 1 secondary glucose taken for safety Pre 10:40 221 1 none per protocol. Post 12:55 156/78 68 18 98.6 182 1 none per protocol Treatment Response Treatment Toleration: Well Treatment Completion Status: Treatment Completed without Adverse  Event Treatment Notes Patient arrived prepared for treatment. Blood glucose measured at 194 mg/dL (>099). Patient's last meal was 2100 yesterday and medicines to manage diabetes were last taken at 2300 yesterday. Patient dressed for treatment. Based on past encounters, felt it prudent to take a secondary blood glucose prior to treatment start. Secondary glucose was 221 mg/dL at 8338. Patient safely placed in chamber after performing safety check. Chamber was compressed at a rate of 1 psi/min until reaching 8.5 psig and rate was increased to 1.5psi/min. Upon reaching 2 ATA rate was increased to 2 psi/min. Patient denied any issues with ear equalization. Patient tolerated treatment and decompression at 2 psi/min. Patient did mention that his ears were still clogged stating they felt like they "have water in them" upon exiting the chamber. Patient seems to tolerate a slower rate closer to ambient pressure better. Patient stated his ears will equalize shortly. Patient was stable upon discharge. Electronic Signature(s) Signed: 11/26/2022 1:29:46 PM By: 11/28/2022 CHT EMT BS , , Signed: 11/26/2022 4:40:59 PM By: 11/28/2022 MD Previous Signature: 11/26/2022 1:27:40 PM Version By: 11/28/2022 CHT EMT BS , , Previous Signature: 11/26/2022 1:25:21 PM Version By: 11/28/2022 CHT EMT BS , , Entered By: Haywood Pao on 11/26/2022 13:29:45 11/28/2022 ([name].pdf Page 2 of 2 -------------------------------------------------------------------------------- HBO Safety Checklist Details Patient Name: Date of Service: [name], [name]. 11/26/2022 10:00 A M Medical Record Number: 11/28/2022 Patient Account Number: [id number] Date of Birth/Sex: Treating RN: 01-Apr-1956 (66 y.o. 79 Primary Care Ariez Neilan: [name] Other Clinician: Foye [name] Referring Kindra Bickham: Treating Kamariyah Timberlake/Extender: Haywood Pao in Treatment: 11 HBO Safety Checklist Items Safety Checklist Consent Form Signed Patient voided / foley secured and emptied When did you last eato 2100 yesterday Last dose of injectable or oral  agent 2300 yesterday Ostomy pouch emptied and vented if applicable NA All implantable devices assessed, documented and approved NA Intravenous access site secured and place NA Valuables secured Linens and cotton and cotton/polyester blend (less than 51% polyester) Personal oil-based products / skin lotions / body lotions removed Wigs or hairpieces removed NA Smoking or tobacco materials removed NA Books / newspapers / magazines / loose paper removed Cologne, aftershave, perfume and deodorant removed Jewelry removed (may wrap wedding band) Make-up removed NA Hair care products removed Battery operated devices (external) removed Heating patches and chemical warmers removed Titanium eyewear removed Nail polish cured greater than 10 hours NA Casting material cured greater than 10 hours NA Hearing aids removed NA Loose dentures or partials removed dentures removed Prosthetics have been removed NA Patient demonstrates correct use of air break device (if applicable) Patient concerns have been addressed Patient grounding bracelet on and cord attached to chamber Specifics for Inpatients (complete in addition to above) Medication sheet sent with patient NA Intravenous medications needed or due during therapy sent with patient NA Drainage tubes (e.g. nasogastric tube or chest tube secured and vented) NA Endotracheal or Tracheotomy tube secured NA Cuff deflated of air and inflated with saline NA Airway suctioned NA Notes Paper version used prior to treatment. Electronic Signature(s) Signed: 11/26/2022 1:11:17 PM By: Haywood Pao CHT EMT BS , , Entered By: Haywood Pao on 11/26/2022 13:11:17

## 2022-11-26 NOTE — Progress Notes (Signed)
[name], [name] ([id number]) [id number] Page 1 of 1 Visit Report for 11/26/2022 SuperBill Details Patient Name: Date of Service: GUAGE, EFFERSON [name] 11/26/2022 Medical Record Number: [id number] Patient Account Number: [id number] Date of Birth/Sex: Treating RN: 10-Jun-1956 (66 y.o. 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.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, left ankle and foot E11.65 Type 2 diabetes mellitus with hyperglycemia E11.621 Type 2 diabetes mellitus with foot ulcer Facility Procedures The patient participates with Medicare or their insurance follows the Medicare Facility Guidelines CPT4 Code Description Modifier Quantity [id number] G0277-(Facility Use Only) HBOT full body chamber, , 4 ICD-10 Diagnosis Description [phone] Other chronic osteomyelitis, 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 Physician Procedures Quantity CPT4 Code Description Modifier [id number] 99183 - WC PHYS HYPERBARIC OXYGEN THERAPY 1 ICD-10 Diagnosis Description 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, 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

## 2022-11-26 NOTE — Progress Notes (Signed)
ARICK, MARENO R ([id number]) [id number] Page 1 of 2 Visit Report for 11/26/2022 Arrival Information Details Patient Name: Date of Service: [place], [name] 11/26/2022 10:00 A M Medical Record Number: [id number] Patient Account Number: [id number] Date of Birth/Sex: Treating RN: 19-Apr-1956 (66 y.o. [name] Primary Care Suleman Gunning: Foye [name] Other Clinician: Haywood Pao Referring Sota Hetz: Treating Mariangel Ringley/Extender: Little Ishikawa in Treatment: 11 Visit Information History Since Last Visit All ordered tests and consults were completed: Yes Patient Arrived: Knee Scooter Added or deleted any medications: No Arrival Time: 09:52 Any new allergies or adverse reactions: No Accompanied By: self Had a fall or experienced change in No Transfer Assistance: None activities of daily living that may affect Patient Identification Verified: Yes risk of falls: Secondary Verification Process Completed: Yes Signs or symptoms of abuse/neglect since last visito No Patient Requires Transmission-Based Precautions: No Hospitalized since last visit: No Patient Has Alerts: No Implantable device outside of the clinic excluding No cellular tissue based products placed in the center since last visit: Pain Present Now: No Electronic Signature(s) Signed: 11/26/2022 1:08:38 PM By: Haywood Pao CHT EMT BS , , Entered By: Haywood Pao on 11/26/2022 13:08:38 -------------------------------------------------------------------------------- Encounter Discharge Information Details Patient Name: Date of Service: [place], [name] 11/26/2022 10:00 A M Medical Record Number: [id number] Patient Account Number: [id number] Date of Birth/Sex: Treating RN: 11-11-1956 (66 y.o. [name] Primary Care Nyana Haren: Foye [name] Other Clinician: Haywood Pao Referring Gatsby Chismar: Treating Mardelle Pandolfi/Extender: Little Ishikawa in Treatment: 11 Encounter Discharge Information Items Discharge Condition: Stable Ambulatory Status: Knee Scooter Discharge Destination: Home Transportation: Private Auto Accompanied By: self Schedule Follow-up Appointment: No Clinical Summary of Care: Electronic Signature(s) Signed: 11/26/2022 1:32:31 PM By: Haywood Pao CHT EMT BS , , Entered By: Haywood Pao on 11/26/2022 13:32:31 [name] ([id number]) [id number] Page 2 of 2 -------------------------------------------------------------------------------- Vitals Details Patient Name: Date of Service: [place], [name] 11/26/2022 10:00 A M Medical Record Number: [id number] Patient Account Number: [id number] Date of Birth/Sex: Treating RN: July 14, 1956 (66 y.o. [name] Primary Care Dajanay Northrup: Foye [name] Other Clinician: Haywood Pao Referring Royale Swamy: Treating Brevyn Ring/Extender: Little Ishikawa in Treatment: 11 Vital Signs Time Taken: 10:37 Temperature (F): 98.3 Height (in): 74 Pulse (bpm): 75 Weight (lbs): 245 Respiratory Rate (breaths/min): 20 Body Mass Index (BMI): 31.5 Blood Pressure (mmHg): 130/75 Capillary Blood Glucose (mg/dl): 893 Reference Range: 80 - 120 mg / dl Electronic Signature(s) Signed: 11/26/2022 1:09:43 PM By: Haywood Pao CHT EMT BS , , Entered By: Haywood Pao on 11/26/2022 13:09:43

## 2022-11-29 ENCOUNTER — Encounter (HOSPITAL_BASED_OUTPATIENT_CLINIC_OR_DEPARTMENT_OTHER): Payer: Medicare Other | Admitting: Internal Medicine

## 2022-11-29 DIAGNOSIS — E11621 Type 2 diabetes mellitus with foot ulcer: Secondary | ICD-10-CM | POA: Diagnosis not present

## 2022-11-29 LAB — GLUCOSE, CAPILLARY
Glucose-Capillary: 182 mg/dL — ABNORMAL HIGH (ref 70–99)
Glucose-Capillary: 213 mg/dL — ABNORMAL HIGH (ref 70–99)

## 2022-11-29 NOTE — Progress Notes (Signed)
EASHAN, SCHIPANI R ([id number]) [id number] Page 1 of 1 Visit Report for 11/25/2022 SuperBill Details Patient Name: Date of Service: [place], [name] 11/25/2022 Medical Record Number: [id number] Patient Account Number: [id number] Date of Birth/Sex: Treating RN: 1956-03-16 (66 y.o. 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.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, left ankle and foot E11.65 Type 2 diabetes mellitus with hyperglycemia E11.621 Type 2 diabetes mellitus with foot ulcer Facility Procedures The patient participates with Medicare or their insurance follows the Medicare Facility Guidelines CPT4 Code Description Modifier Quantity [id number] G0277-(Facility Use Only) HBOT full body chamber, , 4 ICD-10 Diagnosis Description [phone] Other chronic osteomyelitis, 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 Physician Procedures Quantity CPT4 Code Description Modifier [id number] 99183 - WC PHYS HYPERBARIC OXYGEN THERAPY 1 ICD-10 Diagnosis Description 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, 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

## 2022-11-29 NOTE — Progress Notes (Addendum)
[name], [name] ([id number]) [id number] Page 1 of 2 Visit Report for 11/25/2022 HBO Details Patient Name: Date of Service: [name], [name]. 11/25/2022 10:00 A M Medical Record Number: [id number] Patient Account Number: [id number] Date of Birth/Sex: Treating RN: 01-Mar-1956 (66 y.o. 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 HBO Treatment Details Treatment Number: 20 Patient Type: Outpatient Chamber Type: Monoplace Chamber Serial #: [id number] Treatment Protocol: 2.0 ATA with 90 minutes oxygen, with two 5 minute air breaks Treatment Details Compression Rate Down: 1.5 psi / minute De-Compression Rate Up: 1.5 psi / minute A breaks and breathing ir Compress Tx Pressure periods Decompress Decompress Begins Reached (leave unused spaces Begins Ends blank) Chamber Pressure (ATA 1 2 2 2 2 2  --2 1 ) Clock Time (24 hr) 10:51 11:05 11:35 11:40 12:11 12:16 - - 12:45 13:00 Treatment Length: 129 (minutes) Treatment Segments: 4 Vital Signs Capillary Blood Glucose Reference Range: 80 - 120 mg / dl HBO Diabetic Blood Glucose Intervention Range: <131 mg/dl or mg/dl Type: Time Vitals Blood Respiratory Capillary Blood Glucose Pulse Action Pulse: Temperature: Taken: Pressure: Rate: Glucose (mg/dl): Meter #: Oximetry (%) Taken: Pre 10:16 148/87 66 17 98.6 326 MD informed Post 13:04 162/77 67 18 98.1 279 1 informed physician Pre 10:41 335 1 Informed physician. Treatment Response Treatment Toleration: Well Treatment Completion Status: Treatment Completed without Adverse Event Treatment  Notes Patient arrived, prepared for treatment, self-administered Afrin per directions. Patient stated that his glucose was high this morning and he took 12 Units Novolog at approximately 0645. Pre-treatment, glucose was 326 mg/dL. Informed physician and relayed information from physician that patient needs to meet with PCP for getter glucose control. Patient dressed for treatment. Prior to treatment I re-measured blood glucose for safety. Blood glucose level had increased to 335 mg/dL. Patient safely placed in chamber and travelled at 1 psi/min until reaching 5 psig, where patient denied issues with ear equalization. Rate set was increased to 1.5 psi/min until reaching 2 ATA at which point rate set was again increased to 2 psi/min. Patient tolerated treatment well. Decompression was at the rate of 2 psi/min until reaching approximately 5 psig at which time rate set was slowed to 1.5 psi/min. Patient denied issues with ear equalization. Blood glucose level was 279 mg/dL. Patient was stable upon discharge. Seven [place] Notes No concerns with treatment given Physician HBO Attestation: I certify that I supervised this HBO treatment in accordance with Medicare guidelines. A trained emergency response team is readily available per Yes hospital policies and procedures. Continue HBOT as ordered. Yes Electronic Signature(s) Signed: 11/29/2022 7:37:54 AM By: 12/01/2022 MD Previous Signature: 11/25/2022 2:39:48 PM Version By: 11/27/2022 CHT EMT BS , , [place], [place] [name] ([name]) [id number] Page 2 of 2 Previous Signature: 11/25/2022 2:39:48 PM Version By: 11/27/2022 CHT EMT BS , , Previous Signature: 11/29/2022 7:29:51 AM Version By: 12/01/2022 MD Previous Signature: 11/25/2022 2:37:34 PM Version By: 11/27/2022 CHT EMT BS , , Entered By: Haywood Pao on 11/29/2022  07:35:16 -------------------------------------------------------------------------------- HBO Safety Checklist Details Patient Name: Date of Service: [name], [name]. 11/25/2022 10:00 A M Medical Record Number: 11/27/2022 Patient Account Number: [id number] Date of Birth/Sex: Treating RN: 05-08-1956 (66 y.o. 79, [name]  Primary Care Carneshia Raker: Foye [name] Other Clinician: Referring Sheletha Bow: Treating Kahlen Morais/Extender: Little Ishikawa in Treatment: 10 HBO Safety Checklist Items Safety Checklist Consent Form Signed Patient voided / foley secured and emptied N/A DID NOT EAT SUGAR 326; MD MADE ; When did you last eato AWARE Last dose of injectable or oral agent 0645 Ostomy pouch emptied and vented if applicable NA All implantable devices assessed, documented and approved Intravenous access site secured and place Valuables secured Linens and cotton and cotton/polyester blend (less than 51% polyester) Personal oil-based products / skin lotions / body lotions removed Wigs or hairpieces removed Smoking or tobacco materials removed Books / newspapers / magazines / loose paper removed Cologne, aftershave, perfume and deodorant removed Jewelry removed (may wrap wedding band) Make-up removed Hair care products removed Battery operated devices (external) removed Heating patches and chemical warmers removed Titanium eyewear removed Nail polish cured greater than 10 hours Casting material cured greater than 10 hours Hearing aids removed Loose dentures or partials removed Prosthetics have been removed Patient demonstrates correct use of air break device (if applicable) Patient concerns have been addressed Patient grounding bracelet on and cord attached to chamber Specifics for Inpatients (complete in addition to above) Medication sheet sent with patient Intravenous medications needed or due during therapy sent with patient Drainage tubes (e.g.  nasogastric tube or chest tube secured and vented) Endotracheal or Tracheotomy tube secured Cuff deflated of air and inflated with saline Airway suctioned Electronic Signature(s) Signed: 11/25/2022 5:16:09 PM By: Fonnie Mu RN Entered By: Fonnie Mu on 11/25/2022 12:01:37

## 2022-11-29 NOTE — Progress Notes (Signed)
ALEKSANDR, PELLOW ([id number]) [id number] Page 1 of 2 Visit Report for 11/29/2022 Arrival Information Details Patient Name: Date of Service: [name], [name] 11/29/2022 10:00 A M Medical Record Number: [id number] Patient Account Number: [id number] Date of Birth/Sex: Treating RN: 18-Oct-1956 (66 y.o. [name] Primary Care Ly Bacchi: Foye [name] Other Clinician: Haywood Pao Referring Devonia Farro: Treating Alania Overholt/Extender: Little Ishikawa in Treatment: 11 Visit Information History Since Last Visit All ordered tests and consults were completed: Yes Patient Arrived: Knee Scooter Added or deleted any medications: No Arrival Time: 10:20 Any new allergies or adverse reactions: No Accompanied By: self Had a fall or experienced change in No Transfer Assistance: None activities of daily living that may affect Patient Identification Verified: Yes risk of falls: Secondary Verification Process Completed: Yes Signs or symptoms of abuse/neglect since last visito No Patient Requires Transmission-Based Precautions: No Hospitalized since last visit: No Patient Has Alerts: No Implantable device outside of the clinic excluding No cellular tissue based products placed in the center since last visit: Pain Present Now: No Electronic Signature(s) Signed: 11/29/2022 2:00:54 PM By: Haywood Pao CHT EMT BS , , Entered By: Haywood Pao on 11/29/2022 14:00:54 -------------------------------------------------------------------------------- Encounter Discharge Information Details Patient Name: Date of Service: [place], [name] 11/29/2022 10:00 A M Medical Record Number: [id number] Patient Account Number: [id number] Date of Birth/Sex: Treating RN: 09/06/1956 (66 y.o. [name] Primary Care Treyton Slimp: Foye [name] Other Clinician: Haywood Pao Referring Carmelita Amparo: Treating Massai Hankerson/Extender: Little Ishikawa in Treatment: 11 Encounter Discharge Information Items Discharge Condition: Stable Ambulatory Status: Knee Scooter Discharge Destination: Home Transportation: Private Auto Accompanied By: self Schedule Follow-up Appointment: No Clinical Summary of Care: Electronic Signature(s) Signed: 11/29/2022 2:17:34 PM By: Haywood Pao CHT EMT BS , , Entered By: Haywood Pao on 11/29/2022 14:17:34 [name] ([id number]) [id number] Page 2 of 2 -------------------------------------------------------------------------------- Vitals Details Patient Name: Date of Service: [place], [name] 11/29/2022 10:00 A M Medical Record Number: [id number] Patient Account Number: [id number] Date of Birth/Sex: Treating RN: 24-Feb-1956 (66 y.o. [name] Primary Care Myron Stankovich: Foye [name] Other Clinician: Haywood Pao Referring Eldra Word: Treating Ivonne Freeburg/Extender: Little Ishikawa in Treatment: 11 Vital Signs Time Taken: 10:44 Temperature (F): 98.4 Height (in): 74 Pulse (bpm): 78 Weight (lbs): 245 Respiratory Rate (breaths/min): 18 Body Mass Index (BMI): 31.5 Blood Pressure (mmHg): 114/70 Capillary Blood Glucose (mg/dl): 741 Reference Range: 80 - 120 mg / dl Electronic Signature(s) Signed: 11/29/2022 2:01:42 PM By: Haywood Pao CHT EMT BS , , Entered By: Haywood Pao on 11/29/2022 14:01:42

## 2022-11-29 NOTE — Progress Notes (Signed)
[name], [name] ([id number]) [id number] Page 1 of 2 Visit Report for 11/29/2022 HBO Details Patient Name: Date of Service: [name], [name]. 11/29/2022 10:00 A M Medical Record [name]: [id number] Patient Account [name]: [id number] Date of Birth/Sex: Treating RN: 1956-11-18 (66 y.o. 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 HBO [name] Details [name]: 22 Patient Type: Outpatient Chamber Type: Monoplace Chamber Serial #: [id number] [name] Protocol: 2.0 ATA with 90 minutes oxygen, with two 5 minute air breaks [name] Details Compression Rate Down: 1.5 psi / minute De-Compression Rate Up: 1.5 psi / minute A breaks and breathing ir Compress Tx Pressure periods Decompress Decompress Begins Reached (leave unused spaces Begins Ends blank) Chamber Pressure (ATA 1 2 2 2 2 2  --2 1 ) Clock Time (24 hr) 10:50 11:07 11:37 11:42 12:12 12:17 - - 12:47 13:05 [name] Length: 135 (minutes) [name] Segments: 4 Vital Signs Capillary Blood Glucose Reference Range: 80 - 120 mg / dl HBO Diabetic Blood Glucose Intervention Range: <131 mg/dl or mg/dl Type: Time Vitals Blood Respiratory Capillary Blood Glucose Pulse Action Pulse: Temperature: Taken: Pressure: Rate: Glucose (mg/dl): Meter #: Oximetry (%) Taken: Pre 10:44 114/70 78 18 98.4 182 1 none per protocol Post 13:15 159/78 68 18 98.4 213 1 none per protocol [name] Response [name] Toleration: Well [name] Completion Status: [name] Completed without Adverse Event [name] Patient arrived and prepared  for [name]. Mr. Krotzer stated he ate a sausage biscuit coming up the road this morning approximately 0945. Blood glucose was measured at 182 mg/dL. Patient states that he took Novolog approximately [phone]. Based on prior encounters, a small bottle of orange juice sent in with patient for safety with instructions not to consume any of the beverage unless experiencing symptoms of hypoglycemia. Chamber compressed at 1 psi/min until reaching 10 psig, followed by 1.5 psi/min until reaching 2ATA, and 2 psi/min until reaching [name] depth of 2.5 ATA. Patient tolerated [name] well. Decompression of chamber was at 2 psi/min until reaching 9 psig followed by 1.5 psi/min until reaching ambient pressure. Patient did not consume the orange juice. Post-[name] glucose was 213 mg/dL. Patient denied any issues with ear equalization and/or pain. Patient was stable upon discharge. [name] No concerns with [name] given Physician HBO Attestation: I certify that I supervised this HBO [name] in accordance with Medicare guidelines. A trained emergency response team is readily available per Yes hospital policies and procedures. Continue HBOT as ordered. Yes Electronic Signature(s) Signed: 11/29/2022 5:06:55 PM By: 12/01/2022 MD Previous Signature: 11/29/2022 2:16:16 PM Version By: 12/01/2022 CHT EMT BS , , Entered By: [name] on 11/29/2022 17:00:41 12/01/2022 [name] (Nona [id number] Page 2 of 2 -------------------------------------------------------------------------------- HBO Safety Checklist Details Patient Name: Date of Service: [name], [name]. 11/29/2022 10:00 A M Medical Record [name]: 12/01/2022 Patient Account [name]: [id number] Date of Birth/Sex: Treating RN: 02/02/1956 (66 y.o. 79 Primary Care [name]: [name] [name]: [name] Referring [name]: Treating [name] in [name]: 11 HBO Safety Checklist Items Safety Checklist Consent Form Signed Patient voided / foley secured and emptied When did you last eato 0945 Last dose of injectable or oral agent 0610-Novolog Ostomy pouch emptied and vented if  applicable NA All implantable devices assessed, documented and approved NA Intravenous access site secured and place NA Valuables secured Linens and cotton and cotton/polyester blend (less than 51% polyester) Personal oil-based products / skin lotions / body lotions removed Wigs or hairpieces removed NA Smoking or tobacco materials removed NA Books / newspapers / magazines / loose paper removed Cologne, aftershave, perfume and deodorant removed Jewelry removed (may wrap wedding band) Make-up removed NA Hair care products removed Battery operated devices (external) removed Heating patches and chemical warmers removed Titanium eyewear removed Nail polish cured greater than 10 hours NA Casting material cured greater than 10 hours NA Hearing aids removed NA Loose dentures or partials removed NA Prosthetics have been removed NA Patient demonstrates correct use of air break device (if applicable) Patient concerns have been addressed Patient grounding bracelet on and cord attached to chamber Specifics for Inpatients (complete in addition to above) Medication sheet sent with patient NA Intravenous medications needed or due during therapy sent with patient NA Drainage tubes (e.g. nasogastric tube or chest tube secured and vented) NA Endotracheal or Tracheotomy tube secured NA Cuff deflated of air and inflated with saline NA Airway suctioned NA [name] Paper version used prior to [name]. Electronic Signature(s) Signed: 11/29/2022 2:02:57 PM By: [name] CHT EMT BS , , Entered By: [name] on 11/29/2022 14:02:57

## 2022-11-30 ENCOUNTER — Encounter (HOSPITAL_BASED_OUTPATIENT_CLINIC_OR_DEPARTMENT_OTHER): Payer: Medicare Other | Admitting: General Surgery

## 2022-11-30 ENCOUNTER — Encounter (HOSPITAL_BASED_OUTPATIENT_CLINIC_OR_DEPARTMENT_OTHER): Payer: Self-pay

## 2022-11-30 DIAGNOSIS — E11621 Type 2 diabetes mellitus with foot ulcer: Secondary | ICD-10-CM | POA: Diagnosis not present

## 2022-11-30 NOTE — Progress Notes (Signed)
TANVIR, HIPPLE ([id number]) [id number] Page 1 of 1 Visit Report for 11/29/2022 SuperBill Details Patient Name: Date of Service: [place], [name] 11/29/2022 Medical Record Number: [id number] Patient Account Number: [id number] Date of Birth/Sex: Treating RN: [birth year] (66 y.o. 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, left ankle and foot E11.65 Type 2 diabetes mellitus with hyperglycemia E11.621 Type 2 diabetes mellitus with foot ulcer Facility Procedures The patient participates with Medicare or their insurance follows the Medicare Facility Guidelines CPT4 Code Description Modifier Quantity [id number]-(Facility Use Only) HBOT full body chamber, , 4 ICD-10 Diagnosis Description [phone] Other chronic osteomyelitis, 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 Physician Procedures Quantity CPT4 Code Description Modifier [id number] - WC PHYS HYPERBARIC OXYGEN THERAPY 1 ICD-10 Diagnosis Description 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

## 2022-12-01 ENCOUNTER — Encounter (HOSPITAL_BASED_OUTPATIENT_CLINIC_OR_DEPARTMENT_OTHER): Payer: Medicare Other | Admitting: General Surgery

## 2022-12-01 DIAGNOSIS — E11621 Type 2 diabetes mellitus with foot ulcer: Secondary | ICD-10-CM | POA: Diagnosis not present

## 2022-12-01 LAB — GLUCOSE, CAPILLARY
Glucose-Capillary: 316 mg/dL — ABNORMAL HIGH (ref 70–99)
Glucose-Capillary: 249 mg/dL — ABNORMAL HIGH (ref 70–99)

## 2022-12-01 NOTE — Progress Notes (Signed)
JEDD, SCHULENBURG R ([id number]) [id number] Page 1 of 12 Visit Report for 11/30/2022 Chief Complaint Document Details Patient Name: Date of Service: [name], [name] LD R. 11/30/2022 9:15 A M Medical Record Number: [id number] Patient Account Number: [id number] Date of Birth/Sex: Treating RN: [birth year] (66 y.o. M) Primary Care Provider: Nelda Bucks Other Clinician: Referring Provider: Treating Provider/Extender: Doristine Bosworth in Treatment: 11 Information Obtained from: Patient Chief Complaint Patients presents for treatment of an open diabetic ulcer and evaluation for hyperbaric oxygen therapy Electronic Signature(s) Signed: 11/30/2022 9:50:03 AM By: Fredirick Maudlin MD FACS Entered By: Fredirick Maudlin on 11/30/2022 09:50:03 -------------------------------------------------------------------------------- Debridement Details Patient Name: Date of Service: [name], [name] 11/30/2022 9:15 A M Medical Record Number: [id number] Patient Account Number: [id number] Date of Birth/Sex: Treating RN: 1956/07/16 (66 y.o. [name] Primary Care Provider: Nelda Bucks Other Clinician: Referring Provider: Treating Provider/Extender: Doristine Bosworth in Treatment: 11 Debridement Performed for Assessment: Wound #2 Left,Dorsal Foot Performed By: Physician Fredirick Maudlin, MD Debridement Type: Debridement Severity of Tissue Pre Debridement: Fat layer exposed Level of Consciousness (Pre-procedure): Awake and Alert Pre-procedure Verification/Time Out Yes - 09:39 Taken: Start Time: 09:39 T Area Debrided (L x W): otal 3.8 (cm) x 2.8 (cm) = 10.64 (cm) Tissue and other material debrided: Non-Viable, Slough, Slough Level: Non-Viable Tissue Debridement Description: Selective/Open Wound Instrument: Curette Bleeding: Minimum Hemostasis Achieved: Pressure Response to Treatment: Procedure was tolerated  well Level of Consciousness (Post- Awake and Alert procedure): Post Debridement Measurements of Total Wound Length: (cm) 4.1 Width: (cm) 2.8 Depth: (cm) 0.1 Volume: (cm) 0.902 Character of Wound/Ulcer Post Debridement: Improved Severity of Tissue Post Debridement: Fat layer exposed Post Procedure Diagnosis Same as Pre-procedure CALIL, AMOR R ([id number]) [id number] Page 2 of 12 Notes scribed for Dr. Celine Ahr by Adline Peals, RN Electronic Signature(s) Signed: 11/30/2022 11:22:41 AM By: Fredirick Maudlin MD FACS Signed: 11/30/2022 3:57:12 PM By: Sabas Sous By: Adline Peals on 11/30/2022 09:40:04 -------------------------------------------------------------------------------- Debridement Details Patient Name: Date of Service: [name], [name] NA LD R. 11/30/2022 9:15 A M Medical Record Number: [id number] Patient Account Number: [id number] Date of Birth/Sex: Treating RN: 1955/12/22 (66 y.o. [name] Primary Care Provider: Nelda Bucks Other Clinician: Referring Provider: Treating Provider/Extender: Doristine Bosworth in Treatment: 11 Debridement Performed for Assessment: Wound #1 Left Calcaneus Performed By: Physician Fredirick Maudlin, MD Debridement Type: Debridement Severity of Tissue Pre Debridement: Fat layer exposed Level of Consciousness (Pre-procedure): Awake and Alert Pre-procedure Verification/Time Out Yes - 09:39 Taken: Start Time: 09:39 T Area Debrided (L x W): otal 5 (cm) x 3.5 (cm) = 17.5 (cm) Tissue and other material debrided: Non-Viable, Slough, Skin: Epidermis, Slough Level: Skin/Epidermis Debridement Description: Selective/Open Wound Instrument: Curette Bleeding: Minimum Hemostasis Achieved: Pressure Response to Treatment: Procedure was tolerated well Level of Consciousness (Post- Awake and Alert procedure): Post Debridement Measurements of Total Wound Length: (cm)  5.5 Width: (cm) 4.4 Depth: (cm) 0.7 Volume: (cm) 13.305 Character of Wound/Ulcer Post Debridement: Improved Severity of Tissue Post Debridement: Fat layer exposed Post Procedure Diagnosis Same as Pre-procedure Notes scribed for Dr. Celine Ahr by Adline Peals, RN Electronic Signature(s) Signed: 11/30/2022 11:22:41 AM By: Fredirick Maudlin MD FACS Signed: 11/30/2022 3:57:12 PM By: Adline Peals Entered By: Adline Peals on 11/30/2022 09:46:29 -------------------------------------------------------------------------------- HPI Details Patient Name: Date of Service: [name], [name] 11/30/2022 9:15 A M Medical Record Number: [id number] Patient Account Number: [id number] [name], [name] ([id number]) [id number] Page 3 of 12 Date of Birth/Sex: Treating RN: 1956-07-11 (  66 y.o. M) Primary Care Provider: Other Clinician: Nelda Bucks Referring Provider: Treating Provider/Extender: Doristine Bosworth in Treatment: 11 History of Present Illness HPI Description: ADMISSION 09/10/2022 This is a 66 year old poorly controlled type II diabetic (last hemoglobin A1c 10.8%) who has had an ulcer on his heel for over 3 years. He has been seen in multiple wound care centers, including [facility] and [facility]. He reports that at least 3 doctors have recommended that he undergo below-knee amputation. He most recently met with Dr. Catalina Gravel, a vascular surgeon affiliated with [facility]. 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 Unfortunately, the patient has some extenuating social circumstances including the fact that he cares for his wife who has stage IV colon cancer and still works, driving vehicles for [employer]. 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 He was recently hospitalized at [facility] for sepsis and  currently has a PICC line through which he receives IV antibiotics. He reports having had another MRI during that hospital stay along with a chest x-ray and EKG. He apparently contacted one of the hyperbaric therapy techs here and asked a number of questions about hyperbaric oxygen treatments. He subsequently self-referred to our center to undergo further evaluation and management. I mention to him that [facility] actually has hyperbaric chambers, but he states that he lives in [place] and this would be more convenient for him given the intensive nature of the therapy and time requirement. ABI in clinic today was 0.94. The patient actually has 2 wounds. There is a wound on the dorsum of his left foot with heavy black eschar and slough present. After debridement, this was demonstrated to involve the muscle and the extensor tendons are exposed. On his heel, there is essentially a "shark bite" type wound, with much of the heel fat pad absent. The muscle layer is exposed. There is blue-green staining around the perimeter of the wound, but no significant odor. He says he has been applying collagen to the wound on his heel and Silvadene and Betadine to the wound on his dorsal foot. 09/20/2022: The heel wound is quite macerated with wet periwound callus. There is slough accumulation on the surface. The dorsal foot wound looks better this week. There is still exposed tendon, but it is fairly clean with just a little biofilm buildup. We are still working on gathering the required documentation to submit for pretreatment review for hyperbaric oxygen therapy. 09/29/2022: The dorsal foot wound continues to improve. I do not see any exposed tendon at this point. There is just some slough accumulation on the wound surface. He continues to have very wet macerated periwound callus on his heel. There is slough on the surface, but it is loose and thin. There is an area of undermining at the 11 o'clock position, but the  overlying skin and subcutaneous tissue is healthy and viable. He has been approved for hyperbaric oxygen therapy and will start treatment tomorrow. 10/06/2022: Continued contraction and improvement of the dorsal foot wound. There is just a little bit of slough accumulation on the wound surface. The periwound callus continues to accumulate on the heel wound and it is quite macerated. It is also persistently found with blue-green staining present. He did initiate his hyperbaric oxygen therapy, but has had significant difficulty with decompression. He will be going to ENT to have PE tubes placed. 10/18/2022: His hyperbaric oxygen therapy  is on hold while his otological issues are being addressed. He came in again today with the periwound skin on both the dorsal aspect of his foot and his calcaneus completely macerated. The blue-green discoloration, however, has abated and the undermining on the calcaneus has improved. The dorsal foot wound has also contracted somewhat. 10/26/2022: The dorsal foot wound continues to contract and fill with good granulation tissue. The heel, once again, has a rim of macerated callus, but the undermining and tunneling continues to contract. He has completed his oral antibiotics and has been using the [facility] topical compounded antibiotic for his dressing changes at home. He is scheduled to see ENT this afternoon. 11/08/2022: The dorsal foot wound is flush with the surrounding skin and has a good granulation tissue surface. There is some slough accumulation. As usual, the heel has a rim of macerated callus but the dimensions are smaller and the tunneling and undermining have contracted further. He has resumed his hyperbaric oxygen therapy. 12/12; patient seen for wound evaluation. He is tolerating HBO although we could not dive him yesterday because of relative hypoglycemia and the fact he had given himself NovoLog insulin before he came to clinic. Today his blood sugar is in  the 180 range she should be fine. He has a large wound on the plantar calcaneus on the right and a more superficial area on the dorsal foot. He is using a scooter for offloading 11/30/2022: The dorsal foot wound continues to contract. There is some slough on the wound surface. The large calcaneus wound has heaped up wet callus around the margins. Continued undermining. Electronic Signature(s) Signed: 11/30/2022 10:02:06 AM By: Fredirick Maudlin MD FACS Previous Signature: 11/30/2022 9:52:25 AM Version By: Fredirick Maudlin MD FACS Entered By: Fredirick Maudlin on 11/30/2022 10:02:06 -------------------------------------------------------------------------------- Physical Exam Details Patient Name: Date of Service: [place], [name] 11/30/2022 9:15 A M Medical Record Number: [id number] Patient Account Number: [id number] Date of Birth/Sex: Treating RN: 1956-11-14 (66 y.o. M) Primary Care Provider: Nelda Bucks Other Clinician: Referring Provider: Treating Provider/Extender: Doristine Bosworth in Treatment: 11 Constitutional He is hypertensive, but asymptomatic.. . . . No acute distress. WAYDEN, SCHWERTNER R ([id number]) [id number] Page 4 of 12 Respiratory Normal work of breathing on room air. Notes 11/30/2022: The dorsal foot wound continues to contract. There is some slough on the wound surface. The large calcaneus wound has heaped up wet callus around the margins. Continued undermining. Electronic Signature(s) Signed: 11/30/2022 11:22:41 AM By: Fredirick Maudlin MD FACS Entered By: Fredirick Maudlin on 11/30/2022 10:03:28 -------------------------------------------------------------------------------- Physician Orders Details Patient Name: Date of Service: [place], [name] 11/30/2022 9:15 A M Medical Record Number: [id number] Patient Account Number: [id number] Date of Birth/Sex: Treating RN: May 10, 1956 (66 y.o. 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 - Dr. Celine Ahr Rm 4 Return A Bathing/ Shower/ Hygiene May shower with protection but do not get wound dressing(s) wet. Negative Presssure Wound Therapy Wound #1 Left Calcaneus Other: - run insurance for wound vac Edema Control - Lymphedema /  SCD / Other Left Lower Extremity Elevate legs to the level of the heart or above for 30 minutes daily and/or when sitting, a frequency of: Avoid standing for long periods of time. Hyperbaric Oxygen Therapy Wound #1 Left Calcaneus Evaluate for HBO Therapy Indication: - chronic refractory osteomyelitis If appropriate for treatment, begin HBOT per protocol: 2.5 ATA for 90 Minutes with 2 Five (5) Minute A Breaks ir Total Number of Treatments: - 40 One treatments per day (delivered Monday through Friday unless otherwise specified in Special Instructions below): Finger stick Blood Glucose Pre- and Post- HBOT Treatment. Follow Hyperbaric Oxygen Glycemia Protocol A frin (Oxymetazoline HCL) 0.05% nasal spray - 1 spray in both nostrils daily as needed prior to HBO treatment for difficulty clearing ears Wound Treatment Wound #1 - Calcaneus Wound Laterality: Left Cleanser: Soap and Water 1 x Per Day/30 Days Discharge Instructions: May shower and wash wound with dial antibacterial soap and water prior to dressing change. Peri-Wound Care: Sween Lotion  (Moisturizing lotion) 1 x Per Day/30 Days Discharge Instructions: Apply moisturizing lotion as directed KINSER, FELLMAN ([id number]) [phone].pdf Page 5 of 12 Prim Dressing: KerraCel Ag Gelling Fiber Dressing, 4x5 in (silver alginate) (Generic) 1 x Per Day/30 Days ary Discharge Instructions: Apply silver alginate to wound bed as instructed Secondary Dressing: ABD Pad, 5x9 (Generic) 1 x Per Day/30 Days Discharge Instructions: Apply over primary dressing as directed. Secondary Dressing: Zetuvit Plus Silicone Border Dressing 5x5 (in/in) (Generic) 1 x Per Day/30 Days Discharge Instructions: Apply silicone border over primary dressing as directed. Secured With: 49M Medipore Public affairs consultant Surgical T 2x10 (in/yd) (Generic) 1 x Per Day/30 Days ape Discharge Instructions: Secure with tape as directed. Compression Wrap: Kerlix Roll 4.5x3.1 (in/yd) (Generic) 1 x Per Day/30 Days Discharge Instructions: Apply Kerlix and Coban compression as directed. Compression Wrap: Coban Self-Adherent Wrap 4x5 (in/yd) (Generic) 1 x Per Day/30 Days Discharge Instructions: Apply over Kerlix as directed. Wound #2 - Foot Wound Laterality: Dorsal, Left Cleanser: Soap and Water 1 x Per Day/30 Days Discharge Instructions: May shower and wash wound with dial antibacterial soap and water prior to dressing change. Peri-Wound Care: Sween Lotion (Moisturizing lotion) 1 x Per Day/30 Days Discharge Instructions: Apply moisturizing lotion as directed Prim Dressing: KerraCel Ag Gelling Fiber Dressing, 4x5 in (silver alginate) (Generic) 1 x Per Day/30 Days ary Discharge Instructions: Apply silver alginate to wound bed as instructed Secondary Dressing: ALLEVYN Heel 4 1/2in x 5 1/2in / 10.5cm x 13.5cm (Generic) 1 x Per Day/30 Days Discharge Instructions: Apply over primary dressing as directed. Secondary Dressing: Zetuvit Plus Silicone Border Dressing 5x5 (in/in) (Generic) 1 x Per Day/30 Days Discharge  Instructions: Apply silicone border over primary dressing as directed. Secured With: 49M Medipore Public affairs consultant Surgical T 2x10 (in/yd) 1 x Per Day/30 Days ape Discharge Instructions: Secure with tape as directed. Compression Wrap: Kerlix Roll 4.5x3.1 (in/yd) (Generic) 1 x Per Day/30 Days Discharge Instructions: Apply Kerlix and Coban compression as directed. Compression Wrap: Coban Self-Adherent Wrap 4x5 (in/yd) (Generic) 1 x Per Day/30 Days Discharge Instructions: Apply over Kerlix as directed. GLYCEMIA INTERVENTIONS PROTOCOL PRE-HBO GLYCEMIA INTERVENTIONS ACTION INTERVENTION Obtain pre-HBO capillary blood glucose (ensure 1 physician order is in chart). A. Notify HBO physician and await physician orders. 2 If result is 70 mg/dl or below: B. If the result meets the hospital definition of a critical result, follow hospital policy. A. Give patient an 8 ounce Glucerna Shake, an 8 ounce Ensure, or 8 ounces of a Glucerna/Ensure equivalent dietary supplement*. B. Wait  30 minutes. If result is 71 mg/dl to 130 mg/dl: C. Retest patients capillary blood glucose (CBG). D. If result greater than or equal to 110 mg/dl, proceed with HBO. If result less than 110 mg/dl, notify HBO physician and consider holding HBO. If result is 131 mg/dl to 249 mg/dl: A. Proceed with HBO. A. Notify HBO physician and await physician orders. B. It is recommended to hold HBO and do If result is 250 mg/dl or greater: blood/urine ketone testing. C. If the result meets the hospital definition of a critical result, follow hospital policy. POST-HBO GLYCEMIA INTERVENTIONS ACTION INTERVENTION Obtain post HBO capillary blood glucose (ensure 1 physician order is in chart). A. Notify HBO physician and await physician orders. 2 If result is 70 mg/dl or below: B. If the result meets the hospital definition of a critical result, follow hospital policy. A. Give patient an 8 ounce Glucerna Shake, an 8 ounce  Ensure, or 8 ounces of a TERRY, BOLOTIN R ([id number]) [phone].pdf Page 6 of 12 Glucerna/Ensure equivalent dietary supplement*. B. Wait 15 minutes for symptoms of If result is 71 mg/dl to 100 mg/dl: hypoglycemia (i.e. nervousness, anxiety, sweating, chills, clamminess, irritability, confusion, tachycardia or dizziness). C. If patient asymptomatic, discharge patient. If patient symptomatic, repeat capillary blood glucose (CBG) and notify HBO physician. If result is 101 mg/dl to 249 mg/dl: A. Discharge patient. A. Notify HBO physician and await physician orders. B. It is recommended to do blood/urine ketone If result is 250 mg/dl or greater: testing. C. If the result meets the hospital definition of a critical result, follow hospital policy. *Juice or candies are NOT equivalent products. If patient refuses the Glucerna or Ensure, please consult the hospital dietitian for an appropriate substitute. Electronic Signature(s) Signed: 11/30/2022 11:22:41 AM By: Fredirick Maudlin MD FACS Entered By: Fredirick Maudlin on 11/30/2022 10:03:42 -------------------------------------------------------------------------------- Problem List Details Patient Name: Date of Service: [place], [name] NA LD R. 11/30/2022 9:15 A M Medical Record Number: [id number] Patient Account Number: [id number] Date of Birth/Sex: Treating RN: 1956/12/05 (66 y.o. 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 09/10/2022 No Yes L97.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 09/10/2022 No Yes P54.656 Other chronic osteomyelitis, left ankle and foot 09/10/2022 No Yes E11.65 Type 2 diabetes  mellitus with hyperglycemia 09/10/2022 No Yes E11.621 Type 2 diabetes mellitus with foot ulcer 09/10/2022 No Yes Inactive Problems Resolved Problems Electronic Signature(s) Signed: 11/30/2022 9:49:45 AM By: Fredirick Maudlin MD FACS Entered By: Fredirick Maudlin on 11/30/2022 09:49:45 [name] ([id number]) [id number] Page 7 of 12 -------------------------------------------------------------------------------- Progress Note Details Patient Name: Date of Service: [place], [name] LD R. 11/30/2022 9:15 A M Medical Record Number: [id number] Patient Account Number: [id number] Date of Birth/Sex: Treating RN: Mar 23, 1956 (66 y.o. M) Primary Care Provider: Nelda Bucks Other Clinician: Referring Provider: Treating Provider/Extender: Doristine Bosworth in Treatment: 11 Subjective Chief Complaint Information obtained from Patient Patients presents for treatment of an open diabetic ulcer and evaluation for hyperbaric oxygen therapy History of Present Illness (HPI) ADMISSION 09/10/2022 This is a 66 year old poorly controlled type II diabetic (last hemoglobin A1c 10.8%) who has had an ulcer on his heel for over 3 years. He has been seen in multiple wound care centers, including [facility] and [facility]. He  reports that at least 3 doctors have recommended that he undergo below-knee amputation. He most recently met with Dr. Catalina Gravel, a vascular surgeon affiliated with [facility]. 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 Unfortunately, the patient has some extenuating social circumstances including the fact that he cares for his wife who has stage IV colon cancer and still works, driving vehicles for [employer]. 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 He was recently hospitalized at [facility] for sepsis and currently has a  PICC line through which he receives IV antibiotics. He reports having had another MRI during that hospital stay along with a chest x-ray and EKG. He apparently contacted one of the hyperbaric therapy techs here and asked a number of questions about hyperbaric oxygen treatments. He subsequently self-referred to our center to undergo further evaluation and management. I mention to him that [facility] actually has hyperbaric chambers, but he states that he lives in [place] and this would be more convenient for him given the intensive nature of the therapy and time requirement. ABI in clinic today was 0.94. The patient actually has 2 wounds. There is a wound on the dorsum of his left foot with heavy black eschar and slough present. After debridement, this was demonstrated to involve the muscle and the extensor tendons are exposed. On his heel, there is essentially a "shark bite" type wound, with much of the heel fat pad absent. The muscle layer is exposed. There is blue-green staining around the perimeter of the wound, but no significant odor. He says he has been applying collagen to the wound on his heel and Silvadene and Betadine to the wound on his dorsal foot. 09/20/2022: The heel wound is quite macerated with wet periwound callus. There is slough accumulation on the surface. The dorsal foot wound looks better this week. There is still exposed tendon, but it is fairly clean with just a little biofilm buildup. We are still working on gathering the required documentation to submit for pretreatment review for hyperbaric oxygen therapy. 09/29/2022: The dorsal foot wound continues to improve. I do not see any exposed tendon at this point. There is just some slough accumulation on the wound surface. He continues to have very wet macerated periwound callus on his heel. There is slough on the surface, but it is loose and thin. There is an area of undermining at the 11 o'clock position, but the overlying skin  and subcutaneous tissue is healthy and viable. He has been approved for hyperbaric oxygen therapy and will start treatment tomorrow. 10/06/2022: Continued contraction and improvement of the dorsal foot wound. There is just a little bit of slough accumulation on the wound surface. The periwound callus continues to accumulate on the heel wound and it is quite macerated. It is also persistently found with blue-green staining present. He did initiate his hyperbaric oxygen therapy, but has had significant difficulty with decompression. He will be going to ENT to have PE tubes placed. 10/18/2022: His hyperbaric oxygen therapy is on hold while his otological issues are being addressed. He came in again today with the periwound skin on both the dorsal aspect of his foot and his calcaneus completely macerated. The blue-green discoloration, however, has abated and the undermining on the calcaneus has improved. The dorsal foot wound has also contracted somewhat. 10/26/2022: The dorsal foot wound continues to contract and fill with good granulation tissue. The heel, once again, has  a rim of macerated callus, but the undermining and tunneling continues to contract. He has completed his oral antibiotics and has been using the [facility] topical compounded antibiotic for his dressing changes at home. He is scheduled to see ENT this afternoon. 11/08/2022: The dorsal foot wound is flush with the surrounding skin and has a good granulation tissue surface. There is some slough accumulation. As usual, the heel has a rim of macerated callus but the dimensions are smaller and the tunneling and undermining have contracted further. He has resumed his hyperbaric oxygen therapy. 12/12; patient seen for wound evaluation. He is tolerating HBO although we could not dive him yesterday because of relative hypoglycemia and the fact he had given himself NovoLog insulin before he came to clinic. Today his blood sugar is in the 180 range  she should be fine. He has a large wound on the plantar calcaneus on the right and a more superficial area on the dorsal foot. He is using a scooter for offloading 11/30/2022: The dorsal foot wound continues to contract. There is some slough on the wound surface. The large calcaneus wound has heaped up wet callus around the margins. Continued undermining. Patient History Family History Cancer - Father,Mother, Diabetes - Mother,Father, Lung Disease - Father, Thyroid Problems - Mother, No family history of Heart Disease, Hereditary Spherocytosis, Hypertension, Kidney Disease, Seizures, Stroke, Tuberculosis. Social History Never smoker, Marital Status - Married, Alcohol Use - Never, Drug Use - No History, Caffeine Use - Daily. Medical History Eyes Patient has history of Cataracts Denies history of Glaucoma, Optic Neuritis Ear/Nose/Mouth/Throat QUINTEL, MCCALLA ([id number]) [id number] Page 8 of 12 Denies history of Chronic sinus problems/congestion, Middle ear problems Respiratory Patient has history of Sleep Apnea Cardiovascular Patient has history of Hypertension Gastrointestinal Denies history of Cirrhosis , Colitis, Crohnoos, Hepatitis A, Hepatitis B, Hepatitis C Endocrine Patient has history of Type II Diabetes Immunological Denies history of Lupus Erythematosus, Raynaudoos, Scleroderma Musculoskeletal Patient has history of Osteomyelitis - 2023 Neurologic Patient has history of Neuropathy - Bila lower extremities Oncologic Denies history of Received Chemotherapy, Received Radiation Psychiatric Denies history of Anorexia/bulimia, Confinement Anxiety Hospitalization/Surgery History - I and D Left calcaneus. - back surgery- laminectomy. - eye surgery- Bila cataracts. - shoulder arthroscopy. Medical A Surgical History Notes nd Cardiovascular hyperlipidemia Genitourinary AKI Objective Constitutional He is hypertensive, but asymptomatic.Marland Kitchen No acute  distress. Vitals Time Taken: 9:26 AM, Height: 74 in, Weight: 245 lbs, BMI: 31.5, Temperature: 97.8 F, Pulse: 75 bpm, Respiratory Rate: 16 breaths/min, Blood Pressure: 159/85 mmHg. Respiratory Normal work of breathing on room air. General Notes: 11/30/2022: The dorsal foot wound continues to contract. There is some slough on the wound surface. The large calcaneus wound has heaped up wet callus around the margins. Continued undermining. Integumentary (Hair, Skin) Wound #1 status is Open. Original cause of wound was Pressure Injury. The date acquired was: 03/09/2019. The wound has been in treatment 11 weeks. The wound is located on the Left Calcaneus. The wound measures 5.5cm length x 4.4cm width x 0.7cm depth; 19.007cm^2 area and 13.305cm^3 volume. There is Fat Layer (Subcutaneous Tissue) exposed. There is no tunneling noted, however, there is undermining starting at 4:00 and ending at 5:00 with a maximum distance of 1.4cm. There is a medium amount of serosanguineous drainage noted. The wound margin is fibrotic, thickened scar. There is large (67-100%) red, pink granulation within the wound bed. There is a small (1-33%) amount of necrotic tissue within the wound bed including Adherent Slough. The periwound skin appearance  had no abnormalities noted for color. The periwound skin appearance exhibited: Callus, Maceration. The periwound skin appearance did not exhibit: Dry/Scaly. Periwound temperature was noted as No Abnormality. Wound #2 status is Open. Original cause of wound was Pressure Injury. The date acquired was: 07/09/2022. The wound has been in treatment 11 weeks. The wound is located on the Left,Dorsal Foot. The wound measures 4.1cm length x 2.6cm width x 0.1cm depth; 8.372cm^2 area and 0.837cm^3 volume. There is Fat Layer (Subcutaneous Tissue) exposed. There is no tunneling or undermining noted. There is a medium amount of serosanguineous drainage noted. The wound margin is flat and intact.  There is medium (34-66%) pink granulation within the wound bed. There is a medium (34-66%) amount of necrotic tissue within the wound bed including Eschar and Adherent Slough. The periwound skin appearance had no abnormalities noted for texture. The periwound skin appearance had no abnormalities noted for moisture. The periwound skin appearance had no abnormalities noted for color. Periwound temperature was noted as No Abnormality. 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 There was a Selective/Open Wound Skin/Epidermis Debridement with a total area of 17.5 sq cm performed by Fredirick Maudlin, MD. With the following instrument(s): Curette to remove Non-Viable tissue/material. Material removed includes [facility] and Skin: Epidermis and. No specimens were taken. A time out was conducted at 09:39, prior to the start of the procedure. A Minimum amount of bleeding was controlled with Pressure. The procedure was tolerated well. Post Debridement Measurements: 5.5cm length x 4.4cm width x 0.7cm depth; 13.305cm^3 volume. Character of Wound/Ulcer Post Debridement is improved. Severity of Tissue Post Debridement is: Fat layer exposed. Post procedure Diagnosis Wound #1: Same as Pre-Procedure General Notes: scribed for Dr. Celine Ahr by Adline Peals, RN. Wound #2 Pre-procedure diagnosis of Wound #2 is a Diabetic Wound/Ulcer of the Lower Extremity located on the  Left,Dorsal Foot .Severity of Tissue Pre Debridement is: Fat layer exposed. There was a Selective/Open Wound Non-Viable Tissue Debridement with a total area of 10.64 sq cm performed by Fredirick Maudlin, MD. With the following instrument(s): Curette to remove Non-Viable tissue/material. Material removed includes [facility]. No specimens were taken. A time out was conducted at 09:39, prior to the start of the procedure. A Minimum amount of bleeding was controlled with Pressure. The procedure was tolerated well. Post Debridement Measurements: 4.1cm length x 2.8cm width x 0.1cm depth; 0.902cm^3 volume. Character of Wound/Ulcer Post Debridement is improved. Severity of Tissue Post Debridement is: Fat layer exposed. Post procedure Diagnosis Wound #2: Same as Pre-Procedure General Notes: scribed for Dr. Celine Ahr by Adline Peals, RN. Plan Follow-up Appointments: Return Appointment in 1 week. - Dr. Celine Ahr Rm 4 Bathing/ Shower/ Hygiene: May shower with protection but do not get wound dressing(s) wet. Negative Presssure Wound Therapy: Wound #1 Left Calcaneus: Other: - run insurance for wound vac Edema Control - Lymphedema / SCD / Other: Elevate legs to the level of the heart or above for 30 minutes daily and/or when sitting, a frequency of: Avoid standing for long periods of time. Hyperbaric Oxygen Therapy:  Wound #1 Left Calcaneus: Evaluate for HBO Therapy Indication: - chronic refractory osteomyelitis If appropriate for treatment, begin HBOT per protocol: 2.5 ATA for 90 Minutes with 2 Five (5) Minute Air Breaks T Number of Treatments: - 40 otal One treatments per day (delivered Monday through Friday unless otherwise specified in Special Instructions below): Finger stick Blood Glucose Pre- and Post- HBOT Treatment. Follow Hyperbaric Oxygen Glycemia Protocol Afrin (Oxymetazoline HCL) 0.05% nasal spray - 1 spray in both nostrils daily as needed prior to HBO treatment for difficulty clearing  ears WOUND #1: - Calcaneus Wound Laterality: Left Cleanser: Soap and Water 1 x Per Day/30 Days Discharge Instructions: May shower and wash wound with dial antibacterial soap and water prior to dressing change. Peri-Wound Care: Sween Lotion (Moisturizing lotion) 1 x Per Day/30 Days Discharge Instructions: Apply moisturizing lotion as directed Prim Dressing: KerraCel Ag Gelling Fiber Dressing, 4x5 in (silver alginate) (Generic) 1 x Per Day/30 Days ary Discharge Instructions: Apply silver alginate to wound bed as instructed Secondary Dressing: ABD Pad, 5x9 (Generic) 1 x Per Day/30 Days Discharge Instructions: Apply over primary dressing as directed. Secondary Dressing: Zetuvit Plus Silicone Border Dressing 5x5 (in/in) (Generic) 1 x Per Day/30 Days Discharge Instructions: Apply silicone border over primary dressing as directed. Secured With: 74M Medipore Public affairs consultant Surgical T 2x10 (in/yd) (Generic) 1 x Per Day/30 Days ape Discharge Instructions: Secure with tape as directed. Com pression Wrap: Kerlix Roll 4.5x3.1 (in/yd) (Generic) 1 x Per Day/30 Days Discharge Instructions: Apply Kerlix and Coban compression as directed. Com pression Wrap: Coban Self-Adherent Wrap 4x5 (in/yd) (Generic) 1 x Per Day/30 Days Discharge Instructions: Apply over Kerlix as directed. WOUND #2: - Foot Wound Laterality: Dorsal, Left Cleanser: Soap and Water 1 x Per Day/30 Days Discharge Instructions: May shower and wash wound with dial antibacterial soap and water prior to dressing change. Peri-Wound Care: Sween Lotion (Moisturizing lotion) 1 x Per Day/30 Days Discharge Instructions: Apply moisturizing lotion as directed Prim Dressing: KerraCel Ag Gelling Fiber Dressing, 4x5 in (silver alginate) (Generic) 1 x Per Day/30 Days ary Discharge Instructions: Apply silver alginate to wound bed as instructed Secondary Dressing: ALLEVYN Heel 4 1/2in x 5 1/2in / 10.5cm x 13.5cm (Generic) 1 x Per Day/30 Days Discharge  Instructions: Apply over primary dressing as directed. Secondary Dressing: Zetuvit Plus Silicone Border Dressing 5x5 (in/in) (Generic) 1 x Per Day/30 Days Discharge Instructions: Apply silicone border over primary dressing as directed. Secured With: 74M Medipore Public affairs consultant Surgical T 2x10 (in/yd) 1 x Per Day/30 Days ape Discharge Instructions: Secure with tape as directed. Com pression Wrap: Kerlix Roll 4.5x3.1 (in/yd) (Generic) 1 x Per Day/30 Days Discharge Instructions: Apply Kerlix and Coban compression as directed. Com pression Wrap: Coban Self-Adherent Wrap 4x5 (in/yd) (Generic) 1 x Per Day/30 Days Discharge Instructions: Apply over Kerlix as directed. 11/30/2022: The dorsal foot wound continues to contract. There is some slough on the wound surface. The large calcaneus wound has heaped up wet callus around the margins. Continued undermining. JOHNPATRICK, JENNY R ([id number]) [id number] Page 10 of 12 I used a curette to debride slough from the dorsal foot wound and to debride all of the heaped up wet callus around the calcaneal wound. I think he would benefit from a wound VAC to try and bring in the undermined portion of the calcaneus wound. We will work on running that through his insurance. In the meantime, we will continue his [facility] topical antimicrobial compound with silver alginate, Kerlix and Coban. Continue hyperbaric oxygen therapy. Follow-up in  1 week or next available, given the holiday. Electronic Signature(s) Signed: 11/30/2022 10:05:23 AM By: Fredirick Maudlin MD FACS Entered By: Fredirick Maudlin on 11/30/2022 10:05:23 -------------------------------------------------------------------------------- HxROS Details Patient Name: Date of Service: [name], [name] 11/30/2022 9:15 A M Medical Record Number: [id number] Patient Account Number: [id number] Date of Birth/Sex: Treating RN: [birth year] (66 y.o. M) Primary Care Provider: Nelda Bucks Other  Clinician: Referring Provider: Treating Provider/Extender: Doristine Bosworth in Treatment: 11 Eyes Medical History: Positive for: Cataracts Negative for: Glaucoma; Optic Neuritis Ear/Nose/Mouth/Throat Medical History: Negative for: Chronic sinus problems/congestion; Middle ear problems Respiratory Medical History: Positive for: Sleep Apnea Cardiovascular Medical History: Positive for: Hypertension Past Medical History Notes: hyperlipidemia Gastrointestinal Medical History: Negative for: Cirrhosis ; Colitis; Crohns; Hepatitis A; Hepatitis B; Hepatitis C Endocrine Medical History: Positive for: Type II Diabetes Time with diabetes: 20 years Treated with: Insulin Blood sugar tested every day: Yes Tested : 2-3 Genitourinary Medical History: Past Medical History Notes: AKI Immunological Medical History: Negative for: Lupus Erythematosus; Raynauds; Scleroderma Musculoskeletal COLBEY, WIRTANEN ([id number]) [id number] Page 11 of 12 Medical History: Positive for: Osteomyelitis - 2023 Neurologic Medical History: Positive for: Neuropathy - Bila lower extremities Oncologic Medical History: Negative for: Received Chemotherapy; Received Radiation Psychiatric Medical History: Negative for: Anorexia/bulimia; Confinement Anxiety HBO Extended History Items Eyes: Cataracts Immunizations Pneumococcal Vaccine: Received Pneumococcal Vaccination: Yes Received Pneumococcal Vaccination On or After 60th Birthday: Yes Implantable Devices Yes Hospitalization / Surgery History Type of Hospitalization/Surgery I and D Left calcaneus back surgery- laminectomy eye surgery- Bila cataracts shoulder arthroscopy Family and Social History Cancer: Yes - Father,Mother; Diabetes: Yes - Mother,Father; Heart Disease: No; Hereditary Spherocytosis: No; Hypertension: No; Kidney Disease: No; Lung Disease: Yes - Father; Seizures: No; Stroke: No;  Thyroid Problems: Yes - Mother; Tuberculosis: No; Never smoker; Marital Status - Married; Alcohol Use: Never; Drug Use: No History; Caffeine Use: Daily; Financial Concerns: No; Food, Clothing or Shelter Needs: No; Support System Lacking: No; Transportation Concerns: No Electronic Signature(s) Signed: 11/30/2022 11:22:41 AM By: Fredirick Maudlin MD FACS Entered By: Fredirick Maudlin on 11/30/2022 10:02:58 -------------------------------------------------------------------------------- SuperBill Details Patient Name: Date of Service: [name], [name] 11/30/2022 Medical Record Number: [id number] Patient Account Number: [id number] Date of Birth/Sex: Treating RN: 1956-01-08 (66 y.o. 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.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, left ankle and foot E11.65 Type 2 diabetes mellitus with hyperglycemia JAKYREN, FLUEGGE ([id number]) [phone].pdf Page 12 of 12 E11.621 Type 2 diabetes mellitus with foot ulcer Facility Procedures : The patient participates with Medicare or their insurance follows the Medicare Facility Guidelines: CPT4 Code Description Modifier Quantity [id number] - DEBRIDE WOUND 1ST 20 SQ CM OR < 1 ICD-10 Diagnosis Description L97.424 Non-pressure chronic ulcer  of left heel and midfoot with necrosis of bone L97.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 : The patient participates with Medicare or their insurance follows the Medicare Facility Guidelines: [id number] - DEBRIDE WOUND EA ADDL 20 SQ CM 1 ICD-10 Diagnosis Description L97.424 Non-pressure  chronic ulcer of left heel and midfoot with necrosis  of bone L97.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 Physician Procedures : CPT4 Code Description Modifier  [id number] - WC PHYS LEVEL 4 - EST PT 25 ICD-10 Diagnosis Description L97.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 L97.424 Non-pressure chronic ulcer of left heel and midfoot  with necrosis of bone 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, 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 Quantity: 1 : [id number] [id number] - WC PHYS DEBR WO ANESTH 20 SQ CM ICD-10 Diagnosis Description 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

## 2022-12-01 NOTE — Progress Notes (Signed)
DEKOTA, SHENK ([id number]) [id number] Page 1 of 2 Visit Report for 12/01/2022 Arrival Information Details Patient Name: Date of Service: [place], [name] 12/01/2022 1:00 PM Medical Record Number: [id number] Patient Account Number: [id number] Date of Birth/Sex: Treating RN: [birth year] (66 y.o. Dwaine Deter, Jamie Primary Care Cadynce Garrette: Foye [name] Other Clinician: [name] Referring Shulem Mader: Treating Tylek Boney/Extender: Kandis Cocking in Treatment: 11 Visit Information History Since Last Visit All ordered tests and consults were completed: Yes Patient Arrived: Knee Scooter Added or deleted any medications: No Arrival Time: 13:12 Any new allergies or adverse reactions: No Accompanied By: None Had a fall or experienced change in No Transfer Assistance: None activities of daily living that may affect Patient Identification Verified: Yes risk of falls: Secondary Verification Process Completed: Yes Signs or symptoms of abuse/neglect since last visito No Patient Requires Transmission-Based Precautions: No Hospitalized since last visit: No Patient Has Alerts: No Implantable device outside of the clinic excluding No cellular tissue based products placed in the center since last visit: Pain Present Now: No Electronic Signature(s) Signed: 12/01/2022 2:43:09 PM By: [name] EMT Entered By: [name] on 12/01/2022 14:43:08 -------------------------------------------------------------------------------- Encounter Discharge Information Details Patient Name: Date of Service: [place], [name] 12/01/2022 1:00 PM Medical Record Number: [id number] Patient Account Number: [id number] Date of Birth/Sex: Treating RN: 03/29/1956 (66 y.o. [name] Primary Care Andilyn Bettcher: Foye [name] Other Clinician: [name] Referring Yobany Vroom: Treating Rajan Burgard/Extender: Kandis Cocking in  Treatment: 11 Encounter Discharge Information Items Discharge Condition: Stable Ambulatory Status: Knee Scooter Discharge Destination: Home Transportation: Private Auto Accompanied By: None Schedule Follow-up Appointment: Yes Clinical Summary of Care: Electronic Signature(s) Signed: 12/01/2022 5:37:45 PM By: [name] EMT Entered By: [name] on 12/01/2022 17:37:44 Nona Dell R ([id number]) [id number] Page 2 of 2 -------------------------------------------------------------------------------- Vitals Details Patient Name: Date of Service: [place], [name] 12/01/2022 1:00 PM Medical Record Number: [id number] Patient Account Number: [id number] Date of Birth/Sex: Treating RN: 20-May-1956 (66 y.o. [name] Primary Care Afreen Siebels: Foye [name] Other Clinician: [name] Referring Koury Roddy: Treating Kinzie Wickes/Extender: Kandis Cocking in Treatment: 11 Vital Signs Time Taken: 13:18 Capillary Blood Glucose (mg/dl): 623 Height (in): 74 Reference Range: 80 - 120 mg / dl Weight (lbs): 762 Body Mass Index (BMI): 31.5 Electronic Signature(s) Signed: 12/01/2022 2:43:28 PM By: [name] EMT Entered By: [name] on 12/01/2022 14:43:28

## 2022-12-01 NOTE — Progress Notes (Signed)
LONEY, DOMINGO R ([id number]) [id number] Page 1 of 10 Visit Report for 11/30/2022 Arrival Information Details Patient Name: Date of Service: [place], [name] 11/30/2022 9:15 A M Medical Record Number: [id number] Patient Account Number: [id number] Date of Birth/Sex: Treating RN: 12/27/1955 (66 y.o. Lenise Herald, Ladona Ridgel Primary Care Rafan Sanders: Foye [name] Other Clinician: Referring Elonna Mcfarlane: Treating Zakari Couchman/Extender: Kandis Cocking in Treatment: 11 Visit Information History Since Last Visit Added or deleted any medications: No Patient Arrived: Knee Scooter Any new allergies or adverse reactions: No Arrival Time: 09:25 Had a fall or experienced change in No Accompanied By: self activities of daily living that may affect Transfer Assistance: None risk of falls: Patient Identification Verified: Yes Signs or symptoms of abuse/neglect since last visito No Secondary Verification Process Completed: Yes Hospitalized since last visit: No Patient Requires Transmission-Based Precautions: No Implantable device outside of the clinic excluding No Patient Has Alerts: No cellular tissue based products placed in the center since last visit: Has Dressing in Place as Prescribed: Yes Pain Present Now: No Electronic Signature(s) Signed: 11/30/2022 3:57:12 PM By: Samuella Bruin Entered By: Samuella Bruin on 11/30/2022 09:26:24 -------------------------------------------------------------------------------- Encounter Discharge Information Details Patient Name: Date of Service: [name], [name] 11/30/2022 9:15 A M Medical Record Number: [id number] Patient Account Number: [id number] Date of Birth/Sex: Treating RN: 1956-06-19 (66 y.o. [name] Primary Care Angline Schweigert: Foye [name] Other Clinician: Referring Norleen Xie: Treating Jordani Nunn/Extender: Kandis Cocking in Treatment: 11 Encounter  Discharge Information Items Post Procedure Vitals Discharge Condition: Stable Temperature (F): 97.8 Ambulatory Status: Knee Scooter Pulse (bpm): 75 Discharge Destination: Home Respiratory Rate (breaths/min): 18 Transportation: Private Auto Blood Pressure (mmHg): 159/85 Accompanied By: self Schedule Follow-up Appointment: Yes Clinical Summary of Care: Patient Declined Electronic Signature(s) Signed: 11/30/2022 3:57:12 PM By: Samuella Bruin Entered By: Samuella Bruin on 11/30/2022 09:44:32 Granquist, Lavar R ([id number]) [id number] Page 2 of 10 -------------------------------------------------------------------------------- Lower Extremity Assessment Details Patient Name: Date of Service: [place], [name] 11/30/2022 9:15 A M Medical Record Number: [id number] Patient Account Number: [id number] Date of Birth/Sex: Treating RN: 1956-02-09 (66 y.o. [name] Primary Care Jazariah Teall: Foye [name] Other Clinician: Referring Kristine Tiley: Treating Tinslee Klare/Extender: Kandis Cocking in Treatment: 11 Edema Assessment Assessed: Kyra Searles: No] [name]: No] [Left: Edema] [Right: :] Calf Left: Right: Point of Measurement: From Medial Instep 45 cm Ankle Left: Right: Point of Measurement: From Medial Instep 25.5 cm Electronic Signature(s) Signed: 11/30/2022 3:57:12 PM By: Samuella Bruin Entered By: Samuella Bruin on 11/30/2022 09:29:20 -------------------------------------------------------------------------------- Multi Wound Chart Details Patient Name: Date of Service: [name], [name] 11/30/2022 9:15 A M Medical Record Number: [id number] Patient Account Number: [id number] Date of Birth/Sex: Treating RN: Apr 13, 1956 (66 y.o. M) Primary Care Allia Wiltsey: Foye [name] Other Clinician: Referring Alajia Schmelzer: Treating Devyon Keator/Extender: Kandis Cocking in Treatment: 11 Vital Signs Height(in):  74 Pulse(bpm): 75 Weight(lbs): 245 Blood Pressure(mmHg): 159/85 Body Mass Index(BMI): 31.5 Temperature(F): 97.8 Respiratory Rate(breaths/min): 16 [1:Photos:] [N/A:N/A] Left Calcaneus Left, Dorsal Foot N/A Wound Location: Pressure Injury Pressure Injury N/A Wounding Event: Diabetic Wound/Ulcer of the Lower Diabetic Wound/Ulcer of the Lower N/A Primary Etiology: Extremity Extremity Cataracts, Sleep Apnea, Hypertension, Cataracts, Sleep Apnea, Hypertension, N/A Comorbid History: Type II Diabetes, Osteomyelitis, Type II Diabetes, Osteomyelitis, REILEY, KEISLER R ([id number]) [id number] Page 3 of 10 Neuropathy Neuropathy 03/09/2019 07/09/2022 N/A Date Acquired: 44 11 N/A Weeks of Treatment: Open Open N/A Wound Status: No No N/A Wound Recurrence: 5.5x4.4x0.7 4.1x2.6x0.1 N/A Measurements L x W x D (  cm) 19.007 8.372 N/A A (cm) : rea 13.305 0.837 N/A Volume (cm) : 12.00% 11.20% N/A % Reduction in A rea: 38.40% 11.10% N/A % Reduction in Volume: 4 Starting Position 1 (o'clock): 5 Ending Position 1 (o'clock): 1.4 Maximum Distance 1 (cm): Yes No N/A Undermining: Grade 3 Grade 2 N/A Classification: Medium Medium N/A Exudate A mount: Serosanguineous Serosanguineous N/A Exudate Type: red, brown red, brown N/A Exudate Color: Fibrotic scar, thickened scar Flat and Intact N/A Wound Margin: Large (67-100%) Medium (34-66%) N/A Granulation A mount: Red, Pink Pink N/A Granulation Quality: Small (1-33%) Medium (34-66%) N/A Necrotic A mount: Adherent Slough Eschar, Adherent Slough N/A Necrotic Tissue: Fat Layer (Subcutaneous Tissue): Yes Fat Layer (Subcutaneous Tissue): Yes N/A Exposed Structures: Fascia: No Fascia: No Tendon: No Tendon: No Muscle: No Muscle: No Joint: No Joint: No Bone: No Bone: No Small (1-33%) Medium (34-66%) N/A Epithelialization: Debridement - Selective/Open Wound Debridement - Selective/Open Wound  N/A Debridement: Pre-procedure Verification/Time Out 09:39 09:39 N/A Taken: [facility] N/A Tissue Debrided: Skin/Epidermis Non-Viable Tissue N/A Level: 17.5 10.64 N/A Debridement A (sq cm): rea Curette Curette N/A Instrument: Minimum Minimum N/A Bleeding: Pressure Pressure N/A Hemostasis A chieved: Procedure was tolerated well Procedure was tolerated well N/A Debridement Treatment Response: 5.5x4.4x0.7 4.1x2.8x0.1 N/A Post Debridement Measurements L x W x D (cm) 13.305 0.902 N/A Post Debridement Volume: (cm) Callus: Yes No Abnormalities Noted N/A Periwound Skin Texture: Maceration: Yes Maceration: Yes N/A Periwound Skin Moisture: Dry/Scaly: No No Abnormalities Noted Erythema: Yes N/A Periwound Skin Color: No Abnormality No Abnormality N/A Temperature: Debridement Debridement N/A Procedures Performed: Treatment Notes Wound #1 (Calcaneus) Wound Laterality: Left Cleanser Soap and Water Discharge Instruction: May shower and wash wound with dial antibacterial soap and water prior to dressing change. Peri-Wound Care Sween Lotion (Moisturizing lotion) Discharge Instruction: Apply moisturizing lotion as directed Topical Primary Dressing KerraCel Ag Gelling Fiber Dressing, 4x5 in (silver alginate) Discharge Instruction: Apply silver alginate to wound bed as instructed Secondary Dressing ABD Pad, 5x9 Discharge Instruction: Apply over primary dressing as directed. Zetuvit Plus Silicone Border Dressing 5x5 (in/in) Discharge Instruction: Apply silicone border over primary dressing as directed. Secured With [employer] Surgical T 2x10 (in/yd) ape Discharge Instruction: Secure with tape as directed. Compression Wrap Kerlix Roll 4.5x3.1 (in/yd) Discharge Instruction: Apply Kerlix and Coban compression as directed. ORLEAN, ARESCO R ([id number]) [id number] Page 4 of 10 Coban Self-Adherent Wrap 4x5 (in/yd) Discharge Instruction: Apply  over Kerlix as directed. Compression Stockings Add-Ons Wound #2 (Foot) Wound Laterality: Dorsal, Left Cleanser Soap and Water Discharge Instruction: May shower and wash wound with dial antibacterial soap and water prior to dressing change. Peri-Wound Care Sween Lotion (Moisturizing lotion) Discharge Instruction: Apply moisturizing lotion as directed Topical Primary Dressing KerraCel Ag Gelling Fiber Dressing, 4x5 in (silver alginate) Discharge Instruction: Apply silver alginate to wound bed as instructed Secondary Dressing ALLEVYN Heel 4 1/2in x 5 1/2in / 10.5cm x 13.5cm Discharge Instruction: Apply over primary dressing as directed. Zetuvit Plus Silicone Border Dressing 5x5 (in/in) Discharge Instruction: Apply silicone border over primary dressing as directed. Secured With [employer] Surgical T 2x10 (in/yd) ape Discharge Instruction: Secure with tape as directed. Compression Wrap Kerlix Roll 4.5x3.1 (in/yd) Discharge Instruction: Apply Kerlix and Coban compression as directed. Coban Self-Adherent Wrap 4x5 (in/yd) Discharge Instruction: Apply over Kerlix as directed. Compression Stockings Add-Ons Electronic Signature(s) Signed: 11/30/2022 9:49:53 AM By: Fredirick Maudlin MD FACS Entered By: Fredirick Maudlin on 11/30/2022 09:49:53 -------------------------------------------------------------------------------- Multi-Disciplinary Care Plan Details Patient Name: Date of Service:  [place], [name] 11/30/2022 9:15 A M Medical Record Number: [id number] Patient Account Number: [id number] Date of Birth/Sex: Treating RN: 08-May-1956 (66 y.o. 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 procedures Potential for barotraumas to ears, sinuses, teeth, and lungs or cerebral gas embolism related to changes in atmospheric pressure inside hyperbaric oxygen chamber Potential for oxygen toxicity seizures related to delivery of 100% oxygen at an increased atmospheric pressure Potential for pulmonary oxygen toxicity related to delivery of 100% oxygen at an increased atmospheric pressure Goals: Barotrauma will be prevented during HBO2 Date Initiated: 10/06/2022 Target Resolution Date: 01/14/2023 Goal Status: Active Patient and/or family will be able to state/discuss factors appropriate to the management of their disease process during treatment Date Initiated: 10/06/2022 Target Resolution Date: 01/14/2023 Goal Status: Active Patient will tolerate the hyperbaric oxygen therapy treatment Date Initiated: 10/06/2022 Target Resolution Date: 01/14/2023 Goal Status: Active Patient/caregiver will verbalize understanding of HBO goals, rationale, procedures and potential hazards Date Initiated: 10/06/2022 Target Resolution Date: 01/14/2023 Goal Status: Active Interventions: Administer decongestants, per physician orders, prior to HBO2 Administer the correct therapeutic gas delivery based on the patients needs and limitations, per physician order Assess and provide for patients comfort related to the hyperbaric environment and equalization of middle ear Assess for signs and symptoms related to adverse events, including but not limited to confinement anxiety, pneumothorax, oxygen toxicity and baurotrauma Notes: Wound/Skin Impairment Nursing Diagnoses: Impaired tissue integrity Goals: Patient/caregiver will verbalize understanding of skin care regimen Date Initiated: 10/06/2022 Target Resolution Date: 01/14/2023 Goal Status: Active Ulcer/skin breakdown will have a  volume reduction of 30% by week 4 Date Initiated: 09/10/2022 Date Inactivated: 10/06/2022 Target Resolution Date: 10/08/2022 Goal Status: Unmet Unmet Reason: osteo, HBOT Ulcer/skin breakdown will have a volume reduction of 50% by week 8 Date Initiated: 10/06/2022 Target Resolution Date: 01/14/2023 Goal Status: Active Interventions: Assess ulceration(s) every visit Provide education on ulcer and skin care Treatment Activities: Consult for HBO : 09/10/2022 Skin care regimen initiated : 09/10/2022 Notes: Electronic Signature(s) Signed: 11/30/2022 3:57:12 PM By: Adline Peals Entered By: Adline Peals on 11/30/2022 09:38:23 -------------------------------------------------------------------------------- Pain Assessment Details Patient Name: Date of Service: [place], [name] 11/30/2022 9:15 A M Medical Record Number: [id number] Patient Account Number: [id number] Date of Birth/Sex: Treating RN: 21-May-1956 (66 y.o. [name] Primary Care Aina Rossbach: Nelda Bucks Other Clinician: Referring Tashawna Thom: Treating Malasha Kleppe/Extender: Doristine Bosworth in Treatment: [address] LERRY, COUNSELMAN R ([id number]) [id number] Page 6 of 10 Location of Pain Severity and Description of Pain Patient Has Paino No Site Locations Rate the pain. Current Pain Level: 0 Pain Management and Medication Current Pain Management: Electronic Signature(s) Signed: 11/30/2022 3:57:12 PM By: Adline Peals Entered By: Adline Peals on 11/30/2022 09:26:57 -------------------------------------------------------------------------------- Patient/Caregiver Education Details Patient Name: Date of Service: [place] NA LD R. 12/19/2023andnbsp9:15 A M Medical Record Number: [id number] Patient Account Number: [id number] Date of Birth/Gender: Treating RN: [birth year] (66 y.o. [name] Primary Care Physician: Nelda Bucks  Other Clinician: Referring Physician: Treating Physician/Extender: Doristine Bosworth in Treatment: 11 Education Assessment Education Provided To: Patient Education Topics Provided Offloading: Methods: Explain/Verbal Responses: Reinforcements needed, State content correctly Electronic Signature(s) Signed: 11/30/2022 3:57:12 PM By: Adline Peals Entered By:  Adline Peals on 11/30/2022 09:38:40 Wound Assessment Details -------------------------------------------------------------------------------- SJON, SERGIO ([id number]) [phone].pdf Page 7 of 10 Patient Name: Date of Service: [place], [name] 11/30/2022 9:15 A M Medical Record Number: [id number] Patient Account Number: [id number] Date of Birth/Sex: Treating RN: [birth year] (66 y.o. [name] Primary Care Zurri Rudden: Nelda Bucks Other Clinician: Referring Mady Oubre: Treating Ervine Witucki/Extender: Doristine Bosworth in Treatment: 11 Wound Status Wound Number: 1 Primary Diabetic Wound/Ulcer of the Lower Extremity Etiology: Wound Location: Left Calcaneus Wound Open Wounding Event: Pressure Injury Status: Date Acquired: 03/09/2019 Comorbid Cataracts, Sleep Apnea, Hypertension, Type II Diabetes, Weeks Of Treatment: 11 History: Osteomyelitis, Neuropathy Clustered Wound: No Photos Wound Measurements Length: (cm) 5.5 % Reduction in Area: 12% Width: (cm) 4.4 % Reduction in Volume: 38.4% Depth: (cm) 0.7 Epithelialization: Small (1-33%) Area: (cm) 19.007 Tunneling: No Volume: (cm) 13.305 Undermining: Yes Starting Position (o'clock): 4 Ending Position (o'clock): 5 Maximum Distance: (cm) 1.4 Wound Description Classification: Grade 3 Foul Odor After Cleansing: No Wound Margin: Fibrotic scar, thickened scar Slough/Fibrino Yes Exudate Amount: Medium Exudate Type: Serosanguineous Exudate Color: red, brown Wound Bed Granulation  Amount: Large (67-100%) Exposed Structure Granulation Quality: Red, Pink Fascia Exposed: No Necrotic Amount: Small (1-33%) Fat Layer (Subcutaneous Tissue) Exposed: Yes Necrotic Quality: Adherent Slough Tendon Exposed: No Muscle Exposed: No Joint Exposed: No Bone Exposed: No Periwound Skin Texture Texture Color No Abnormalities Noted: No No Abnormalities Noted: Yes Callus: Yes Temperature / Pain Temperature: No Abnormality Moisture No Abnormalities Noted: No Dry / Scaly: No Maceration: Yes Treatment Notes Wound #1 (Calcaneus) Wound Laterality: Left Cleanser Soap and Water Discharge Instruction: May shower and wash wound with dial antibacterial soap and water prior to dressing change. MAISEN, PEARSALL R ([id number]) [id number] Page 8 of 10 Peri-Wound Care Sween Lotion (Moisturizing lotion) Discharge Instruction: Apply moisturizing lotion as directed Topical Primary Dressing KerraCel Ag Gelling Fiber Dressing, 4x5 in (silver alginate) Discharge Instruction: Apply silver alginate to wound bed as instructed Secondary Dressing ABD Pad, 5x9 Discharge Instruction: Apply over primary dressing as directed. Zetuvit Plus Silicone Border Dressing 5x5 (in/in) Discharge Instruction: Apply silicone border over primary dressing as directed. Secured With [employer] Surgical T 2x10 (in/yd) ape Discharge Instruction: Secure with tape as directed. Compression Wrap Kerlix Roll 4.5x3.1 (in/yd) Discharge Instruction: Apply Kerlix and Coban compression as directed. Coban Self-Adherent Wrap 4x5 (in/yd) Discharge Instruction: Apply over Kerlix as directed. Compression Stockings Add-Ons Electronic Signature(s) Signed: 11/30/2022 3:57:12 PM By: Adline Peals Entered By: Adline Peals on 11/30/2022 09:33:57 -------------------------------------------------------------------------------- Wound Assessment Details Patient Name: Date of  Service: [place], [name] 11/30/2022 9:15 A M Medical Record Number: [id number] Patient Account Number: [id number] Date of Birth/Sex: Treating RN: [birth year] (66 y.o. [name] Primary Care Annalei Friesz: Nelda Bucks Other Clinician: Referring Chrisangel Eskenazi: Treating Ishani Goldwasser/Extender: Doristine Bosworth in Treatment: 11 Wound Status Wound Number: 2 Primary Diabetic Wound/Ulcer of the Lower Extremity Etiology: Wound Location: Left, Dorsal Foot Wound Open Wounding Event: Pressure Injury Status: Date Acquired: 07/09/2022 Comorbid Cataracts, Sleep Apnea, Hypertension, Type II Diabetes, Weeks Of Treatment: 11 History: Osteomyelitis, Neuropathy Clustered Wound: No Photos Wound Measurements EDELL, CLEMENTI R ([id number]) Length: (cm) 4.1 Width: (cm) 2.6 Depth: (cm) 0.1 Area: (cm) 8.372 Volume: (cm) 0.837 [id number] Page 9 of 10 % Reduction in Area: 11.2% % Reduction in Volume: 11.1% Epithelialization: Medium (34-66%) Tunneling: No Undermining: No Wound Description Classification: Grade 2 Wound Margin: Flat and Intact Exudate Amount: Medium Exudate Type: Serosanguineous Exudate Color: red, brown Foul Odor  After Cleansing: No Slough/Fibrino Yes Wound Bed Granulation Amount: Medium (34-66%) Exposed Structure Granulation Quality: Pink Fascia Exposed: No Necrotic Amount: Medium (34-66%) Fat Layer (Subcutaneous Tissue) Exposed: Yes Necrotic Quality: Eschar, Adherent Slough Tendon Exposed: No Muscle Exposed: No Joint Exposed: No Bone Exposed: No Periwound Skin Texture Texture Color No Abnormalities Noted: Yes No Abnormalities Noted: Yes Moisture Temperature / Pain No Abnormalities Noted: Yes Temperature: No Abnormality Treatment Notes Wound #2 (Foot) Wound Laterality: Dorsal, Left Cleanser Soap and Water Discharge Instruction: May shower and wash wound with dial antibacterial soap and water prior to dressing  change. Peri-Wound Care Sween Lotion (Moisturizing lotion) Discharge Instruction: Apply moisturizing lotion as directed Topical Primary Dressing KerraCel Ag Gelling Fiber Dressing, 4x5 in (silver alginate) Discharge Instruction: Apply silver alginate to wound bed as instructed Secondary Dressing ALLEVYN Heel 4 1/2in x 5 1/2in / 10.5cm x 13.5cm Discharge Instruction: Apply over primary dressing as directed. Zetuvit Plus Silicone Border Dressing 5x5 (in/in) Discharge Instruction: Apply silicone border over primary dressing as directed. Secured With [employer] Surgical T 2x10 (in/yd) ape Discharge Instruction: Secure with tape as directed. Compression Wrap Kerlix Roll 4.5x3.1 (in/yd) Discharge Instruction: Apply Kerlix and Coban compression as directed. Coban Self-Adherent Wrap 4x5 (in/yd) Discharge Instruction: Apply over Kerlix as directed. Compression Stockings Add-Ons Electronic Signature(s) Signed: 11/30/2022 3:57:12 PM By: Adline Peals Entered By: Adline Peals on 11/30/2022 09:34:37 [name] ([id number]) [id number] Page 10 of 10 -------------------------------------------------------------------------------- Vitals Details Patient Name: Date of Service: [place], [name] 11/30/2022 9:15 A M Medical Record Number: [id number] Patient Account Number: [id number] Date of Birth/Sex: Treating RN: 09-13-1956 (65 y.o. [name] Primary Care Waniya Hoglund: Nelda Bucks Other Clinician: Referring Dillin Lofgren: Treating Gael Londo/Extender: Doristine Bosworth in Treatment: 11 Vital Signs Time Taken: 09:26 Temperature (F): 97.8 Height (in): 74 Pulse (bpm): 75 Weight (lbs): 245 Respiratory Rate (breaths/min): 16 Body Mass Index (BMI): 31.5 Blood Pressure (mmHg): 159/85 Reference Range: 80 - 120 mg / dl Electronic Signature(s) Signed: 11/30/2022 3:57:12 PM By: Adline Peals Entered By:  Adline Peals on 11/30/2022 09:26:52

## 2022-12-02 ENCOUNTER — Encounter (HOSPITAL_BASED_OUTPATIENT_CLINIC_OR_DEPARTMENT_OTHER): Payer: Medicare Other | Admitting: General Surgery

## 2022-12-02 DIAGNOSIS — E11621 Type 2 diabetes mellitus with foot ulcer: Secondary | ICD-10-CM | POA: Diagnosis not present

## 2022-12-02 LAB — GLUCOSE, CAPILLARY
Glucose-Capillary: 278 mg/dL — ABNORMAL HIGH (ref 70–99)
Glucose-Capillary: 356 mg/dL — ABNORMAL HIGH (ref 70–99)
Glucose-Capillary: 159 mg/dL — ABNORMAL HIGH (ref 70–99)

## 2022-12-02 NOTE — Progress Notes (Addendum)
BALEN, PINAULT R ([id number]) [id number] Page 1 of 2 Visit Report for 12/02/2022 HBO Details Patient Name: Date of Service: [place], [name] 12/02/2022 12:30 PM Medical Record Number: [id number] Patient Account Number: [id number] Date of Birth/Sex: Treating RN: [birth year] (66 y.o. 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 HBO Treatment Details Treatment Number: 24 Patient Type: Outpatient Chamber Type: Monoplace Chamber Serial #: [id number] Treatment Protocol: 2.0 ATA with 90 minutes oxygen, with two 5 minute air breaks Treatment Details Compression Rate Down: 1.5 psi / minute De-Compression Rate Up: 1.5 psi / minute A breaks and breathing ir Compress Tx Pressure periods Decompress Decompress Begins Reached (leave unused spaces Begins Ends blank) Chamber Pressure (ATA 1 2 2 2 2 2  --2 1 ) Clock Time (24 hr) 13:38 13:54 14:24 14:29 14:59 15:04 - - 15:34 15:47 Treatment Length: 129 (minutes) Treatment Segments: 4 Vital Signs Capillary Blood Glucose Reference Range: 80 - 120 mg / dl HBO Diabetic Blood Glucose Intervention Range: <131 mg/dl or >249 mg/dl Type: Time Vitals Blood Pulse: Respiratory Temperature: Capillary Blood Glucose Pulse Action Taken: Pressure: Rate: Glucose (mg/dl): Meter #: Oximetry (%) Taken: Pre 12:52 356 1 informed physician of CBG Post 15:55 140/79 83 18 98.7 159 1 none per protocol Pre 13:26 124/75 91 18 98.5 278 1 2 CBG taken for safety per earlier encounters Treatment Response Treatment Toleration: Well Treatment Completion Status: Treatment Completed without  Adverse Event Treatment Notes Patient arrived and prepared for treatment. HBO T Grant initiated vitals. Patient stated at home his CBG was 402 mg/dL @ 0830, took 12 Units Novolog at [facility] [phone]. Ate yesterday at 1430 and popcorn at 1830. Initial CBG reading 356 mg/dL. Physician informed and allowed treatment. HBO T Grand called [facility] to inform them of the need for better management of glucose levels. Secondary CBG taken prior to treatment to gauge effect of Novolog on patient's blood glucose level with result of 278 mg/dL. Orange juice sent in for safety based on earlier encounters and considering patient's current management of blood glucose levels. Patient did not consume the orange juice as there was no indication. Chamber compressed with increased rate; 1 psi/min until reaching 5 psig, 1.5 psi/min until reaching 17 psig, and 2 psi/min until reaching treatment depth of 2.5 ATA. Patient tolerated treatment well and decompression of chamber at decreased rates; 2 psi/min until reaching 13 psig, and 1.5 psi/min until reaching 1 ATA. Patient denied any issues with ear equalization and/or pain during travel for both compression and decompression of chamber. Post treatment glucose was 159 mg/dL. Patient was stable upon discharge. Physician HBO Attestation: I certify that I supervised this HBO treatment in accordance with Medicare guidelines. A trained emergency response team is readily available per Yes hospital policies and procedures. Continue HBOT as ordered. Yes Electronic Signature(s) Signed: 12/07/2022 7:34:28 AM By: Fredirick Maudlin MD FACS Previous Signature: 12/02/2022 4:53:43 PM Version By: Donavan Burnet CHT EMT BS , , Entered By: Fredirick Maudlin on 12/07/2022 07:34:28 [name] ([id number] Page 2 of 2 -------------------------------------------------------------------------------- HBO Safety Checklist Details Patient Name: Date  of Service: [place], [name] 12/02/2022 12:30 PM Medical Record Number: [id number] Patient Account Number: [id number] Date of Birth/Sex: Treating RN: 10-08-1956 [phone]  y.o. [name], [name] Primary Care Vivi Piccirilli: Foye [name] Other Clinician: Haywood Pao Referring Jazzmyn Filion: Treating Atoya Andrew/Extender: Kandis Cocking in Treatment: 11 HBO Safety Checklist Items Safety Checklist Consent Form Signed Patient voided / foley secured and emptied When did you last eato Last night 1830 Last dose of injectable or oral agent n/a Ostomy pouch emptied and vented if applicable NA All implantable devices assessed, documented and approved NA Intravenous access site secured and place NA Valuables secured Linens and cotton and cotton/polyester blend (less than 51% polyester) Personal oil-based products / skin lotions / body lotions removed Wigs or hairpieces removed NA Smoking or tobacco materials removed NA Books / newspapers / magazines / loose paper removed Cologne, aftershave, perfume and deodorant removed Jewelry removed (may wrap wedding band) Make-up removed NA Hair care products removed Battery operated devices (external) removed Heating patches and chemical warmers removed Titanium eyewear removed Nail polish cured greater than 10 hours NA Casting material cured greater than 10 hours NA Hearing aids removed NA Loose dentures or partials removed dentures removed Prosthetics have been removed NA Patient demonstrates correct use of air break device (if applicable) Patient concerns have been addressed Patient grounding bracelet on and cord attached to chamber Specifics for Inpatients (complete in addition to above) Medication sheet sent with patient NA Intravenous medications needed or due during therapy sent with patient NA Drainage tubes (e.g. nasogastric tube or chest tube secured and vented) NA Endotracheal or Tracheotomy tube  secured NA Cuff deflated of air and inflated with saline NA Airway suctioned NA Notes Paper version used prior to treatment. Electronic Signature(s) Signed: 12/02/2022 4:38:59 PM By: Haywood Pao CHT EMT BS , , Entered By: Haywood Pao on 12/02/2022 16:38:59

## 2022-12-02 NOTE — Progress Notes (Addendum)
[name], [name] ([id number]) [id number] Page 1 of 2 Visit Report for 12/01/2022 HBO Details Patient Name: Date of Service: [name]. 12/01/2022 1:00 PM Medical Record Number: [id number] Patient Account Number: [id number] Date of Birth/Sex: Treating RN: 01-11-1956 (66 y.o. 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 HBO Treatment Details Treatment Number: 23 Patient Type: Outpatient Chamber Type: Monoplace Chamber Serial #: [id number] Treatment Protocol: 2.0 ATA with 90 minutes oxygen, with two 5 minute air breaks Treatment Details Compression Rate Down: 2.0 psi / minute De-Compression Rate Up: A breaks and breathing ir Compress Tx Pressure periods Decompress Decompress Begins Reached (leave unused spaces Begins Ends blank) Chamber Pressure (ATA 1 2 2 2 2 2  --2 1 ) Clock Time (24 hr) 13:46 13:57 14:27 14:33 15:03 15:08 - - 15:37 15:50 Treatment Length: 124 (minutes) Treatment Segments: 4 Vital Signs Capillary Blood Glucose Reference Range: 80 - 120 mg / dl HBO Diabetic Blood Glucose Intervention Range: <131 mg/dl or mg/dl Type: Time Vitals Blood Pulse: Respiratory Temperature: Capillary Blood Glucose Pulse Action Taken: Pressure: Rate: Glucose (mg/dl): Meter #: Oximetry (%) Taken: Pre 13:18 316 Dr. >086 informed of CBG Post 15:53 146/68 75 Treatment Response Treatment Toleration: Well Treatment Completion Status: Treatment Completed without Adverse Event Physician HBO Attestation: I certify that I supervised this HBO treatment in accordance with Medicare guidelines. A  trained emergency response team is readily available per Yes hospital policies and procedures. Continue HBOT as ordered. Yes Electronic Signature(s) Signed: 12/02/2022 7:40:03 AM By: 12/04/2022 MD FACS Previous Signature: 12/01/2022 5:33:23 PM Version By: 12/03/2022 EMT Previous Signature: 12/01/2022 4:54:25 PM Version By: 12/03/2022 MD FACS Previous Signature: 12/01/2022 2:45:44 PM Version By: 12/03/2022 EMT Entered By: [name] on 12/02/2022 07:40:02 12/04/2022 ([name] Page 2 of 2 -------------------------------------------------------------------------------- HBO Safety Checklist Details Patient Name: Date of Service: [name]. 12/01/2022 1:00 PM Medical Record Number: 12/03/2022 Patient Account Number: [id number] Date of Birth/Sex: Treating RN: February 23, 1956 (66 y.o. 79 Primary Care Shandale Malak: [name] Other Clinician: Foye [name] Referring [name]: Treating Avien Taha/Extender: [name] in Treatment: 11 HBO Safety Checklist Items Safety Checklist Consent Form Signed Patient voided / foley secured and emptied When did you last eato last night Last dose of injectable or oral agent last night Ostomy pouch emptied and vented if applicable NA All implantable devices assessed, documented and approved NA Intravenous access site secured and place NA Valuables secured Linens and cotton and cotton/polyester blend (less than 51% polyester) Personal oil-based products / skin lotions / body lotions removed Wigs or hairpieces removed NA Smoking or tobacco materials removed Books / newspapers / magazines / loose paper removed Cologne, aftershave, perfume and deodorant removed Jewelry removed (may wrap wedding band) Make-up removed NA Hair care products removed Battery operated devices (external) removed Heating patches and chemical warmers  removed NA Titanium eyewear removed NA Nail polish cured greater than 10 hours NA Casting material cured greater than 10 hours NA Hearing aids removed NA Loose dentures or partials removed removed by patient Prosthetics have been removed NA Patient demonstrates correct use of air break device (if applicable) Patient concerns have been addressed Patient grounding bracelet on and cord  attached to chamber Specifics for Inpatients (complete in addition to above) Medication sheet sent with patient NA Intravenous medications needed or due during therapy sent with patient NA Drainage tubes (e.g. nasogastric tube or chest tube secured and vented) NA Endotracheal or Tracheotomy tube secured NA Cuff deflated of air and inflated with saline NA Airway suctioned NA Notes The safety checklist was done before the treatment was started. Electronic Signature(s) Signed: 12/01/2022 5:26:57 PM By: [name] EMT Previous Signature: 12/01/2022 2:44:59 PM Version By: [name] EMT Entered By: [name] on 12/01/2022 17:26:57

## 2022-12-02 NOTE — Progress Notes (Signed)
[name], [name] ([id number]) [id number] Page 1 of 2 Visit Report for 12/02/2022 Arrival Information Details Patient Name: Date of Service: [place], [name] [name] LD R. 12/02/2022 12:30 PM Medical Record Number: [id number] Patient Account Number: [id number] Date of Birth/Sex: Treating RN: 03-18-1956 (66 y.o. [name], [name] Primary Care Lajoy Vanamburg: Foye [name] Other Clinician: Haywood Pao Referring Lilliona Blakeney: Treating Verania Salberg/Extender: Kandis Cocking in Treatment: 11 Visit Information History Since Last Visit All ordered tests and consults were completed: Yes Patient Arrived: Knee Scooter Added or deleted any medications: No Arrival Time: 12:46 Any new allergies or adverse reactions: No Accompanied By: self Had a fall or experienced change in No Transfer Assistance: None activities of daily living that may affect Patient Identification Verified: Yes risk of falls: Secondary Verification Process Completed: Yes Signs or symptoms of abuse/neglect since last visito No Patient Requires Transmission-Based Precautions: No Hospitalized since last visit: No Patient Has Alerts: No Implantable device outside of the clinic excluding No cellular tissue based products placed in the center since last visit: Pain Present Now: No Electronic Signature(s) Signed: 12/02/2022 4:36:44 PM By: Haywood Pao CHT EMT BS , , Entered By: Haywood Pao on 12/02/2022 16:36:44 -------------------------------------------------------------------------------- Encounter Discharge Information Details Patient Name: Date of Service: [place], [name] [name] LD R. 12/02/2022 12:30 PM Medical Record Number: [id number] Patient Account Number: [id number] Date of Birth/Sex: Treating RN: Jul 09, 1956 (66 y.o. [name] Primary Care Divon Krabill: Foye [name] Other Clinician: Haywood Pao Referring Vanita Cannell: Treating Keishawn Darsey/Extender: Kandis Cocking in Treatment: 11 Encounter Discharge Information Items Discharge Condition: Stable Ambulatory Status: Ambulatory Discharge Destination: Home Transportation: Private Auto Accompanied By: self Schedule Follow-up Appointment: No Clinical Summary of Care: Electronic Signature(s) Signed: 12/02/2022 4:54:25 PM By: Haywood Pao CHT EMT BS , , Entered By: Haywood Pao on 12/02/2022 16:54:25 [name] ([id number]) [id number] Page 2 of 2 -------------------------------------------------------------------------------- Vitals Details Patient Name: Date of Service: [place], [name] [name] LD R. 12/02/2022 12:30 PM Medical Record Number: [id number] Patient Account Number: [id number] Date of Birth/Sex: Treating RN: 1956/09/27 (66 y.o. [name], [name] Primary Care Rondall Radigan: Foye [name] Other Clinician: Haywood Pao Referring Nayleen Janosik: Treating Lismary Kiehn/Extender: Kandis Cocking in Treatment: 11 Vital Signs Time Taken: 12:52 Temperature (F): 98.5 Height (in): 74 Pulse (bpm): 91 Weight (lbs): 245 Respiratory Rate (breaths/min): 18 Body Mass Index (BMI): 31.5 Blood Pressure (mmHg): 124/75 Capillary Blood Glucose (mg/dl): 189 Reference Range: 80 - 120 mg / dl Electronic Signature(s) Signed: 12/02/2022 4:37:40 PM By: Haywood Pao CHT EMT BS , , Entered By: Haywood Pao on 12/02/2022 16:37:39

## 2022-12-02 NOTE — Progress Notes (Signed)
MAKELL, CYR ([id number]) [id number] Page 1 of 2 Visit Report for 12/01/2022 Problem List Details Patient Name: Date of Service: [name], [name] [name] 12/01/2022 1:00 PM Medical Record Number: [id number] Patient Account Number: [id number] Date of Birth/Sex: Treating RN: 14-Apr-1956 (66 y.o. 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 12/01/2022 Medical Record Number: [id number] Patient Account Number: [id number] Date of Birth/Sex: Treating RN: 08-11-1956 (66 y.o. [name] Primary Care Provider: Foye [name] Other Clinician: [name] Referring Provider: Treating Provider/Extender: Kandis Cocking in Treatment: [address] 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, left ankle and foot E11.65 Type 2 diabetes mellitus with hyperglycemia E11.621 Type 2 diabetes mellitus with foot ulcer Facility Procedures : The patient participates with Medicare or their insurance follows the Medicare Facility Guidelines: CPT4 Code Description Modifier Quantity [phone] G0277-(Facility Use Only) HBOT full body chamber, , 4 ICD-10 Diagnosis Description [phone] Other  chronic osteomyelitis, 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 2 diabetes mellitus with foot  ulcer Physician Procedures : CPT4 Code Description Modifier [id number] [phone] - WC PHYS HYPERBARIC OXYGEN THERAPY ICD-10 Diagnosis Description 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

## 2022-12-07 ENCOUNTER — Encounter (HOSPITAL_BASED_OUTPATIENT_CLINIC_OR_DEPARTMENT_OTHER): Payer: Medicare Other | Admitting: General Surgery

## 2022-12-07 NOTE — Progress Notes (Signed)
ALAM, GUTERREZ ([id number]) [id number] Page 1 of 1 Visit Report for 12/02/2022 SuperBill Details Patient Name: Date of Service: [name], [name] [name] 12/02/2022 Medical Record Number: [id number] Patient Account Number: [id number] Date of Birth/Sex: Treating RN: 1956-06-29 (66 y.o. 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, left ankle and foot E11.65 Type 2 diabetes mellitus with hyperglycemia E11.621 Type 2 diabetes mellitus with foot ulcer Facility Procedures The patient participates with Medicare or their insurance follows the Medicare Facility Guidelines CPT4 Code Description Modifier Quantity [id number] G0277-(Facility Use Only) HBOT full body chamber, , 4 ICD-10 Diagnosis Description [phone] Other chronic osteomyelitis, 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 Physician Procedures Quantity CPT4 Code Description Modifier [id number] - WC PHYS HYPERBARIC OXYGEN THERAPY 1 ICD-10 Diagnosis Description 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

## 2022-12-08 ENCOUNTER — Encounter (HOSPITAL_BASED_OUTPATIENT_CLINIC_OR_DEPARTMENT_OTHER): Payer: Medicare Other | Admitting: General Surgery

## 2022-12-08 DIAGNOSIS — E11621 Type 2 diabetes mellitus with foot ulcer: Secondary | ICD-10-CM | POA: Diagnosis not present

## 2022-12-08 LAB — GLUCOSE, CAPILLARY
Glucose-Capillary: 215 mg/dL — ABNORMAL HIGH (ref 70–99)
Glucose-Capillary: 187 mg/dL — ABNORMAL HIGH (ref 70–99)

## 2022-12-08 NOTE — Progress Notes (Signed)
JADIN, CREQUE R ([id number]) [id number] Page 1 of 2 Visit Report for 12/08/2022 Arrival Information Details Patient Name: Date of Service: [name], [name] 12/08/2022 10:00 A M Medical Record Number: [id number] Patient Account Number: [id number] Date of Birth/Sex: Treating RN: [birth year] (66 y.o. [name] Primary Care Maritssa Haughton: Foye [name] Other Clinician: [name] Referring Laniah Grimm: Treating Mechelle Pates/Extender: Kandis Cocking in Treatment: 12 Visit Information History Since Last Visit All ordered tests and consults were completed: Yes Patient Arrived: Ambulatory Added or deleted any medications: No Arrival Time: 09:07 Any new allergies or adverse reactions: No Accompanied By: None Had a fall or experienced change in No Transfer Assistance: None activities of daily living that may affect Patient Identification Verified: Yes risk of falls: Secondary Verification Process Completed: Yes Signs or symptoms of abuse/neglect since last visito No Patient Requires Transmission-Based Precautions: No Hospitalized since last visit: No Patient Has Alerts: No Implantable device outside of the clinic excluding No cellular tissue based products placed in the center since last visit: Pain Present Now: No Notes The patient had a clinic visit before treatment today. Electronic Signature(s) Signed: 12/08/2022 1:50:57 PM By: [name] EMT Entered By: [name] on 12/08/2022 13:50:57 -------------------------------------------------------------------------------- Encounter Discharge Information Details Patient Name: Date of Service: [name], [name] 12/08/2022 10:00 A M Medical Record Number: [id number] Patient Account Number: [id number] Date of Birth/Sex: Treating RN: Dec 30, 1955 (66 y.o. [name] Primary Care Brain Honeycutt: Foye [name] Other Clinician: [name] Referring  Adalbert [name]: Treating Loys Hoselton/Extender: Kandis Cocking in Treatment: 12 Encounter Discharge Information Items Discharge Condition: Stable Ambulatory Status: Ambulatory Discharge Destination: Home Transportation: Private Auto Accompanied By: None Schedule Follow-up Appointment: Yes Clinical Summary of Care: Electronic Signature(s) Signed: 12/08/2022 2:24:28 PM By: [name] EMT Entered By: [name] on 12/08/2022 14:24:28 [name] ([id number]) [id number] Page 2 of 2 -------------------------------------------------------------------------------- Vitals Details Patient Name: Date of Service: [name], [name] 12/08/2022 10:00 A M Medical Record Number: [id number] Patient Account Number: [id number] Date of Birth/Sex: Treating RN: 1956-07-31 (66 y.o. [name] Primary Care Sabrinia Prien: Foye [name] Other Clinician: [name] Referring Norlan Rann: Treating Kearah Gayden/Extender: Kandis Cocking in Treatment: 12 Vital Signs Time Taken: 10:14 Temperature (F): 98.1 Height (in): 74 Pulse (bpm): 91 Weight (lbs): 245 Respiratory Rate (breaths/min): 18 Body Mass Index (BMI): 31.5 Blood Pressure (mmHg): 135/78 Capillary Blood Glucose (mg/dl): 875 Reference Range: 80 - 120 mg / dl Electronic Signature(s) Signed: 12/08/2022 1:51:26 PM By: [name] EMT Entered By: [name] on 12/08/2022 13:51:26

## 2022-12-08 NOTE — Progress Notes (Signed)
AKRAM, KISSICK R ([id number]) [id number] Page 1 of 2 Visit Report for 12/08/2022 Problem List Details Patient Name: Date of Service: [place], [name] 12/08/2022 10:00 A M Medical Record Number: [id number] Patient Account Number: [id number] Date of Birth/Sex: Treating RN: [birth year] (66 y.o. 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 12/08/2022 Medical Record Number: [id number] Patient Account Number: [id number] Date of Birth/Sex: Treating RN: [birth year] (66 y.o. [name] Primary Care Provider: Foye [name] Other Clinician: [name] Referring Provider: Treating Provider/Extender: Kandis Cocking in Treatment: [address] 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, left ankle and foot E11.65 Type 2 diabetes mellitus with hyperglycemia E11.621 Type 2 diabetes mellitus with foot ulcer Facility Procedures : The patient participates with Medicare or their insurance follows the Medicare Facility Guidelines: CPT4 Code Description Modifier Quantity [id number] G0277-(Facility Use Only) HBOT full body chamber, , 4 ICD-10 Diagnosis Description [phone] Other  chronic osteomyelitis, 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 2 diabetes mellitus with foot  ulcer Physician Procedures : CPT4 Code Description Modifier [id number] [phone] - WC PHYS HYPERBARIC OXYGEN THERAPY ICD-10 Diagnosis Description 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

## 2022-12-08 NOTE — Progress Notes (Signed)
LOVE, LAPOLE [name] ([id number]) [id number] Page 1 of 2 Visit Report for 12/08/2022 HBO Details Patient Name: Date of Service: [name], [name]. 12/08/2022 10:00 A M Medical Record Number: [id number] Patient Account Number: [id number] Date of Birth/Sex: Treating RN: [birth year] (66 y.o. 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 HBO Treatment Details Treatment Number: 25 Patient Type: Outpatient Chamber Type: Monoplace Chamber Serial #: [id number] Treatment Protocol: 2.0 ATA with 90 minutes oxygen, with two 5 minute air breaks Treatment Details Compression Rate Down: 1.5 psi / minute De-Compression Rate Up: 2.0 psi / minute A breaks and breathing ir Compress Tx Pressure periods Decompress Decompress Begins Reached (leave unused spaces Begins Ends blank) Chamber Pressure (ATA 1 2 2 2 2 2  --2 1 ) Clock Time (24 hr) 10:41 10:56 11:27 11:32 12:02 12:07 - - 12:37 12:48 Treatment Length: 127 (minutes) Treatment Segments: 4 Vital Signs Capillary Blood Glucose Reference Range: 80 - 120 mg / dl HBO Diabetic Blood Glucose Intervention Range: <131 mg/dl or >249 mg/dl Time Vitals Blood Respiratory Capillary Blood Glucose Pulse Action Type: Pulse: Temperature: Taken: Pressure: Rate: Glucose (mg/dl): Meter #: Oximetry (%) Taken: Pre 10:14 135/78 91 18 98.1 215 Post 12:51 140/74 79 18 98.2 187 Treatment Response Treatment Toleration: Well Treatment Completion Status: Treatment Completed without Adverse Event Physician HBO Attestation: I certify that I supervised this HBO treatment in  accordance with Medicare guidelines. A trained emergency response team is readily available per Yes hospital policies and procedures. Continue HBOT as ordered. Yes Electronic Signature(s) Signed: 12/08/2022 3:10:43 PM By: Fredirick Maudlin MD FACS Previous Signature: 12/08/2022 1:56:41 PM Version By: Valeria Batman EMT Entered By: Fredirick Maudlin on 12/08/2022 15:10:43 HBO Safety Checklist Details -------------------------------------------------------------------------------- [name] ([id number] Page 2 of 2 Patient Name: Date of Service: [name], [name]. 12/08/2022 10:00 A M Medical Record Number: [id number] Patient Account Number: [id number] Date of Birth/Sex: Treating RN: January 18, 1956 (66 y.o. [name] Primary Care Jabarri Stefanelli: Nelda Bucks Other Clinician: Valeria Batman Referring Montrail Mehrer: Treating Tila Millirons/Extender: Doristine Bosworth in Treatment: 12 HBO Safety Checklist Items Safety Checklist Consent Form Signed Patient voided / foley secured and emptied When did you last eato 1730 last night Last dose of injectable or oral agent last night Ostomy pouch emptied and vented if applicable NA All implantable devices assessed, documented and approved Tympanostomy tubes Intravenous access site secured and place NA Valuables secured Linens and cotton and cotton/polyester blend (less than 51% polyester) Personal oil-based products / skin lotions / body lotions removed Wigs or hairpieces removed NA Smoking or tobacco materials removed Books / newspapers / magazines / loose paper removed Cologne, aftershave, perfume and deodorant removed Jewelry removed (may wrap wedding band) Make-up removed NA Hair care products removed Battery operated devices (external) removed Heating patches and chemical warmers removed Titanium eyewear removed NA Nail polish cured greater than 10 hours NA Casting  material cured greater than 10 hours NA Hearing aids removed NA Loose dentures or partials removed NA Prosthetics have been removed NA Patient demonstrates correct use of air break device (if applicable) Patient concerns have been addressed Patient grounding bracelet on and cord attached to chamber Specifics for Inpatients (complete in addition to above) Medication sheet sent  with patient NA Intravenous medications needed or due during therapy sent with patient NA Drainage tubes (e.g. nasogastric tube or chest tube secured and vented) NA Endotracheal or Tracheotomy tube secured NA Cuff deflated of air and inflated with saline NA Airway suctioned NA Notes The safety checklist was done before the treatment was started. Electronic Signature(s) Signed: 12/08/2022 1:54:22 PM By: [name] EMT Entered By: [name] on 12/08/2022 13:54:21

## 2022-12-09 ENCOUNTER — Encounter (HOSPITAL_BASED_OUTPATIENT_CLINIC_OR_DEPARTMENT_OTHER): Payer: Medicare Other | Admitting: General Surgery

## 2022-12-09 DIAGNOSIS — E11621 Type 2 diabetes mellitus with foot ulcer: Secondary | ICD-10-CM | POA: Diagnosis not present

## 2022-12-09 LAB — GLUCOSE, CAPILLARY
Glucose-Capillary: 141 mg/dL — ABNORMAL HIGH (ref 70–99)
Glucose-Capillary: 103 mg/dL — ABNORMAL HIGH (ref 70–99)
Glucose-Capillary: 104 mg/dL — ABNORMAL HIGH (ref 70–99)
Glucose-Capillary: 117 mg/dL — ABNORMAL HIGH (ref 70–99)
Glucose-Capillary: 142 mg/dL — ABNORMAL HIGH (ref 70–99)

## 2022-12-09 NOTE — Progress Notes (Addendum)
LEVONE, OTTEN R ([id number]) [id number] Page 1 of 2 Visit Report for 12/09/2022 HBO Details Patient Name: Date of Service: [place], [name] 12/09/2022 12:00 PM Medical Record Number: [id number] Patient Account Number: [id number] Date of Birth/Sex: Treating RN: 1956-01-19 (66 y.o. 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 HBO Treatment Details Treatment Number: 26 Patient Type: Outpatient Chamber Type: Monoplace Chamber Serial #: [id number] Treatment Protocol: 2.0 ATA with 90 minutes oxygen, with two 5 minute air breaks Treatment Details Compression Rate Down: 1.5 psi / minute De-Compression Rate Up: 2.0 psi / minute A breaks and breathing ir Compress Tx Pressure periods Decompress Decompress Begins Reached (leave unused spaces Begins Ends blank) Chamber Pressure (ATA 1 2 2 2 2 2  --2 1 ) Clock Time (24 hr) 12:47 13:04 13:35 13:40 14:09 14:14 - - 14:45 14:55 Treatment Length: 128 (minutes) Treatment Segments: 4 Vital Signs Capillary Blood Glucose Reference Range: 80 - 120 mg / dl HBO Diabetic Blood Glucose Intervention Range: <131 mg/dl or mg/dl Type: Time Vitals Blood Pulse: Respiratory Temperature: Capillary Blood Glucose Pulse Action Taken: Pressure: Rate: Glucose (mg/dl): Meter #: Oximetry (%) Taken: Pre 12:45 120/60 75 18 98.4 141 1 see note Pre 11:43 103 1 Given 8 oz Glucerna at 1132 based on today's Hx Pre 12:09 104 1 Patient opted for 1 more glucose check. Pre 12:32 117 1 > 110 mg/dL per protocol, cleared for HBO Post 14:58 112/58 73 16 98.4 142 1 none per  protocol Treatment Response Treatment Toleration: Well Treatment Completion Status: Treatment Completed without Adverse Event Treatment Notes Patient arrived, glucose level measured 141 mg/dL. Patient stated that his blood glucose was 423 at 0615 this morning and he took 16 units novolog and 80 units slow acting insulin. Based on this information he was given 8 oz Glucerna. At 1143 prior to treatment start blood glucose was measured again for safety. Result was 103 mg/dL. Dr. >588 alerted, agreed that we can feed the patient. Offered patient a meal, patient opted for 2 cups of peanut butter, 2 crackers, and some orange juice (2 x 4 oz Ocean spray). Blood glucose checked at 1209 resulting in 104 mg/dL. Patient opted for one more glucose check and then reschedule. Informed patient that he needs more guidance with managing his blood glucose and insulin use. Blood glucose at 1232 was 117 mg/dL, within protocol requirements. Dr. Lady Gary informed of patient's progress and treatment started. Based on past encounters it was thought prudent to have the patient take orange juice in for emergency, however, it was not consumed. Patient safely placed in chamber after safety check and chamber compressed with 100% oxygen at rate set of 1 psi/min until reaching 6 psig. At 6 psig, patient denied any issues with ear equalization and/or pain and rate set was increased to 1.5 psi/min, followed by 2 psi/min upon reaching 2 ATA. Patient tolerated treatment then tolerated decompression of chamber at 2 psi/min. Patient denied any symptoms of barotrauma and said he felt fine. Post treatment CBG was 142 mg/dL. Patient was stable upon discharge. Physician HBO Attestation: I certify that I supervised this HBO treatment in accordance with Medicare guidelines. A trained emergency response team is readily available per Yes hospital policies and procedures. Continue HBOT  as ordered. Yes Electronic Signature(s) MERT, DIETRICK R  ([id number]) [id number] Page 2 of 2 Signed: 12/10/2022 6:55:13 AM By: [name] MD FACS Previous Signature: 12/09/2022 5:01:01 PM Version By: Haywood Pao CHT EMT BS , , Previous Signature: 12/09/2022 4:59:26 PM Version By: Haywood Pao CHT EMT BS , , Previous Signature: 12/09/2022 4:20:26 PM Version By: Haywood Pao CHT EMT BS , , Previous Signature: 12/09/2022 1:21:45 PM Version By: Haywood Pao CHT EMT BS , , Entered By: [name] on 12/10/2022 06:55:12 -------------------------------------------------------------------------------- HBO Safety Checklist Details Patient Name: Date of Service: [place], [name] 12/09/2022 12:00 PM Medical Record Number: [id number] Patient Account Number: [id number] Date of Birth/Sex: Treating RN: 05-Jul-1956 (66 y.o. [name] Primary Care Raeya Merritts: Foye [name] Other Clinician: Haywood Pao Referring Johonna Binette: Treating Juwan Vences/Extender: Kandis Cocking in Treatment: 12 HBO Safety Checklist Items Safety Checklist Consent Form Signed Patient voided / foley secured and emptied When did you last eato 1900 yesterdat Last dose of injectable or oral agent 0615 Ostomy pouch emptied and vented if applicable NA All implantable devices assessed, documented and approved NA Intravenous access site secured and place NA Valuables secured Linens and cotton and cotton/polyester blend (less than 51% polyester) Personal oil-based products / skin lotions / body lotions removed Wigs or hairpieces removed NA Smoking or tobacco materials removed NA Books / newspapers / magazines / loose paper removed Cologne, aftershave, perfume and deodorant removed Jewelry removed (may wrap wedding band) Make-up removed NA Hair care products removed Battery operated devices (external) removed Heating patches and chemical warmers removed Titanium eyewear removed Nail polish  cured greater than 10 hours NA Casting material cured greater than 10 hours NA Hearing aids removed NA Loose dentures or partials removed dentures removed Prosthetics have been removed NA Patient demonstrates correct use of air break device (if applicable) Patient concerns have been addressed Patient grounding bracelet on and cord attached to chamber Specifics for Inpatients (complete in addition to above) Medication sheet sent with patient NA Intravenous medications needed or due during therapy sent with patient NA Drainage tubes (e.g. nasogastric tube or chest tube secured and vented) NA Endotracheal or Tracheotomy tube secured NA Cuff deflated of air and inflated with saline NA Airway suctioned NA Notes Paper version used prior to treatment. Electronic Signature(s) Signed: 12/09/2022 1:11:10 PM By: Haywood Pao CHT EMT BS , , Entered By: Haywood Pao on 12/09/2022 13:11:10

## 2022-12-09 NOTE — Progress Notes (Addendum)
ZAKAREE, MCCLENAHAN R ([id number]) [id number] Page 1 of 2 Visit Report for 12/09/2022 Arrival Information Details Patient Name: Date of Service: [place], [name] 12/09/2022 12:00 PM Medical Record Number: [id number] Patient Account Number: [id number] Date of Birth/Sex: Treating RN: March 02, 1956 (66 y.o. Dwaine Deter, Jamie Primary Care Inaya Gillham: Foye [name] Other Clinician: Haywood Pao Referring Janice Seales: Treating Yuri Flener/Extender: Kandis Cocking in Treatment: 12 Visit Information History Since Last Visit All ordered tests and consults were completed: Yes Patient Arrived: Knee Scooter Added or deleted any medications: No Arrival Time: 11:14 Any new allergies or adverse reactions: No Accompanied By: self Had a fall or experienced change in No Transfer Assistance: None activities of daily living that may affect Patient Identification Verified: Yes risk of falls: Secondary Verification Process Completed: Yes Signs or symptoms of abuse/neglect since last visito No Patient Requires Transmission-Based Precautions: No Hospitalized since last visit: No Patient Has Alerts: No Implantable device outside of the clinic excluding No cellular tissue based products placed in the center since last visit: Pain Present Now: No Electronic Signature(s) Signed: 12/09/2022 1:09:13 PM By: Haywood Pao CHT EMT BS , , Entered By: Haywood Pao on 12/09/2022 13:09:13 -------------------------------------------------------------------------------- Encounter Discharge Information Details Patient Name: Date of Service: [place], [name] 12/09/2022 12:00 PM Medical Record Number: [id number] Patient Account Number: [id number] Date of Birth/Sex: Treating RN: 1956-02-28 (66 y.o. [name] Primary Care Dyana Magner: Foye [name] Other Clinician: Haywood Pao Referring Shantera Monts: Treating Natasja Niday/Extender: Kandis Cocking in Treatment: 12 Encounter Discharge Information Items Discharge Condition: Stable Ambulatory Status: Knee Scooter Discharge Destination: Home Transportation: Private Auto Accompanied By: self Schedule Follow-up Appointment: No Clinical Summary of Care: Electronic Signature(s) Signed: 12/09/2022 5:01:53 PM By: Haywood Pao CHT EMT BS , , Entered By: Haywood Pao on 12/09/2022 17:01:53 Nona Dell R ([id number]) [id number] Page 2 of 2 -------------------------------------------------------------------------------- Vitals Details Patient Name: Date of Service: [place], [name] 12/09/2022 12:00 PM Medical Record Number: [id number] Patient Account Number: [id number] Date of Birth/Sex: Treating RN: [birth year] (66 y.o. [name] Primary Care Japji Kok: Foye [name] Other Clinician: Haywood Pao Referring Casi Westerfeld: Treating Coley Littles/Extender: Kandis Cocking in Treatment: 12 Vital Signs Time Taken: 12:45 Temperature (F): 98.4 Height (in): 74 Pulse (bpm): 75 Weight (lbs): 245 Respiratory Rate (breaths/min): 18 Body Mass Index (BMI): 31.5 Blood Pressure (mmHg): 120/60 Capillary Blood Glucose (mg/dl): 127 Reference Range: 80 - 120 mg / dl Electronic Signature(s) Signed: 12/09/2022 1:09:58 PM By: Haywood Pao CHT EMT BS , , Entered By: Haywood Pao on 12/09/2022 13:09:58

## 2022-12-10 ENCOUNTER — Encounter (HOSPITAL_BASED_OUTPATIENT_CLINIC_OR_DEPARTMENT_OTHER): Payer: Medicare Other | Admitting: General Surgery

## 2022-12-10 NOTE — Progress Notes (Signed)
VERDON, FERRANTE R ([id number]) [id number] Page 1 of 1 Visit Report for 12/09/2022 SuperBill Details Patient Name: Date of Service: [name], [name] [name] 12/09/2022 Medical Record Number: [id number] Patient Account Number: [id number] Date of Birth/Sex: Treating RN: 1956-06-25 (66 y.o. 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, left ankle and foot E11.65 Type 2 diabetes mellitus with hyperglycemia E11.621 Type 2 diabetes mellitus with foot ulcer Facility Procedures The patient participates with Medicare or their insurance follows the Medicare Facility Guidelines CPT4 Code Description Modifier Quantity [phone] G0277-(Facility Use Only) HBOT full body chamber, , 4 ICD-10 Diagnosis Description [phone] Other chronic osteomyelitis, 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 Physician Procedures Quantity CPT4 Code Description Modifier [id number] - WC PHYS HYPERBARIC OXYGEN THERAPY 1 ICD-10 Diagnosis Description 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

## 2022-12-10 NOTE — Progress Notes (Signed)
RAMIE, ERMAN R ([id number]) [id number] Page 1 of 9 Visit Report for 12/08/2022 Arrival Information Details Patient Name: Date of Service: [place], [name] 12/08/2022 9:15 A M Medical Record Number: [id number] Patient Account Number: [id number] Date of Birth/Sex: Treating RN: 01-22-1956 (66 y.o. [name] Primary Care Sarafina Puthoff: Foye [name] Other Clinician: Referring Leimomi Zervas: Treating Adron Geisel/Extender: Kandis Cocking in Treatment: 12 Visit Information History Since Last Visit Added or deleted any medications: No Patient Arrived: Ambulatory Any new allergies or adverse reactions: No Arrival Time: 09:17 Had a fall or experienced change in No Accompanied By: self activities of daily living that may affect Transfer Assistance: None risk of falls: Patient Identification Verified: Yes Signs or symptoms of abuse/neglect since last visito No Secondary Verification Process Completed: Yes Hospitalized since last visit: No Patient Requires Transmission-Based Precautions: No Implantable device outside of the clinic excluding No Patient Has Alerts: No cellular tissue based products placed in the center since last visit: Has Dressing in Place as Prescribed: Yes Pain Present Now: No Electronic Signature(s) Signed: 12/10/2022 12:15:24 PM By: Tommie Ard RN Entered By: Tommie Ard on 12/08/2022 09:18:00 -------------------------------------------------------------------------------- Encounter Discharge Information Details Patient Name: Date of Service: [place], [name] 12/08/2022 9:15 A M Medical Record Number: [id number] Patient Account Number: [id number] Date of Birth/Sex: Treating RN: 1956-01-30 (65 y.o. Dianna Limbo Primary Care Demauri Advincula: Foye [name] Other Clinician: Referring Royalty Domagala: Treating Deniz Hannan/Extender: Kandis Cocking in Treatment: 12 Encounter Discharge Information  Items Post Procedure Vitals Discharge Condition: Stable Temperature (F): 98.3 Ambulatory Status: Ambulatory Pulse (bpm): 92 Discharge Destination: Home Respiratory Rate (breaths/min): 18 Transportation: Private Auto Blood Pressure (mmHg): 124/74 Accompanied By: self Schedule Follow-up Appointment: Yes Clinical Summary of Care: Patient Declined Electronic Signature(s) Signed: 12/08/2022 5:27:24 PM By: Karie Schwalbe RN Entered By: Karie Schwalbe on 12/08/2022 17:10:49 Nona Dell R ([id number]) [id number] Page 2 of 9 -------------------------------------------------------------------------------- Lower Extremity Assessment Details Patient Name: Date of Service: [address], [name] 12/08/2022 9:15 A M Medical Record Number: [id number] Patient Account Number: [id number] Date of Birth/Sex: Treating RN: 1956-05-19 (66 y.o. [name] Primary Care Yamira Papa: Foye [name] Other Clinician: Referring Chrles Selley: Treating Jelitza Manninen/Extender: Kandis Cocking in Treatment: 12 Edema Assessment Assessed: Kyra Searles: No] [name]: No] [Left: Edema] [Right: :] Calf Left: Right: Point of Measurement: From Medial Instep 48 cm Ankle Left: Right: Point of Measurement: From Medial Instep 26 cm Vascular Assessment Pulses: Dorsalis Pedis Palpable: [Left:Yes] Electronic Signature(s) Signed: 12/10/2022 12:15:24 PM By: Tommie Ard RN Entered By: Tommie Ard on 12/08/2022 09:28:23 -------------------------------------------------------------------------------- Multi Wound Chart Details Patient Name: Date of Service: [name], [name] 12/08/2022 9:15 A M Medical Record Number: [id number] Patient Account Number: [id number] Date of Birth/Sex: Treating RN: 1956/12/10 (66 y.o. M) Primary Care Lindsee Labarre: Foye [name] Other Clinician: Referring Coltin Casher: Treating Lelon Ikard/Extender: Kandis Cocking in Treatment:  12 Vital Signs Height(in): 74 Pulse(bpm): 92 Weight(lbs): 245 Blood Pressure(mmHg): 124/74 Body Mass Index(BMI): 31.5 Temperature(F): 98.3 Respiratory Rate(breaths/min): 18 [1:Photos:] [N/A:N/A [id number] Page 3 of 9] Left Calcaneus Left, Dorsal Foot N/A Wound Location: Pressure Injury Pressure Injury N/A Wounding Event: Diabetic Wound/Ulcer of the Lower Diabetic Wound/Ulcer of the Lower N/A Primary Etiology: Extremity Extremity Cataracts, Sleep Apnea, Hypertension, Cataracts, Sleep Apnea, Hypertension, N/A Comorbid History: Type II Diabetes, Osteomyelitis, Type II Diabetes, Osteomyelitis, Neuropathy Neuropathy 03/09/2019 07/09/2022 N/A Date Acquired: 12 12 N/A Weeks of Treatment: Open Open N/A Wound Status: No No N/A Wound Recurrence: 5.3x4.5x0.5 5x2.5x0.1 N/A Measurements L  x W x D (cm) 18.732 9.817 N/A A (cm) : rea 9.366 0.982 N/A Volume (cm) : 13.30% -4.20% N/A % Reduction in A rea: 56.60% -4.20% N/A % Reduction in Volume: Grade 3 Grade 2 N/A Classification: Medium Medium N/A Exudate A mount: Serosanguineous Serosanguineous N/A Exudate Type: red, brown red, brown N/A Exudate Color: Fibrotic scar, thickened scar Flat and Intact N/A Wound Margin: Large (67-100%) Medium (34-66%) N/A Granulation A mount: Red, Pink Pink N/A Granulation Quality: Small (1-33%) Medium (34-66%) N/A Necrotic A mount: Adherent Slough Eschar, Adherent Slough N/A Necrotic Tissue: Fat Layer (Subcutaneous Tissue): Yes Fat Layer (Subcutaneous Tissue): Yes N/A Exposed Structures: Fascia: No Fascia: No Tendon: No Tendon: No Muscle: No Muscle: No Joint: No Joint: No Bone: No Bone: No Small (1-33%) Medium (34-66%) N/A Epithelialization: Debridement - Selective/Open Wound Debridement - Excisional N/A Debridement: Pre-procedure Verification/Time Out 09:36 09:36 N/A Taken: Callus, Slough Subcutaneous, Slough N/A Tissue Debrided: Skin/Epidermis  Skin/Subcutaneous Tissue N/A Level: 23.85 12.5 N/A Debridement A (sq cm): rea Curette Curette N/A Instrument: Minimum Minimum N/A Bleeding: Pressure Pressure N/A Hemostasis A chieved: Procedure was tolerated well Procedure was tolerated well N/A Debridement Treatment Response: 5.3x4.5x0.5 5x2.5x0.1 N/A Post Debridement Measurements L x W x D (cm) 9.366 0.982 N/A Post Debridement Volume: (cm) Callus: Yes No Abnormalities Noted N/A Periwound Skin Texture: Maceration: Yes Maceration: Yes N/A Periwound Skin Moisture: Dry/Scaly: No No Abnormalities Noted Erythema: Yes N/A Periwound Skin Color: No Abnormality No Abnormality N/A Temperature: Debridement Debridement N/A Procedures Performed: Treatment Notes Wound #1 (Calcaneus) Wound Laterality: Left Cleanser Peri-Wound Care Topical Primary Dressing Secondary Dressing Secured With Compression Wrap Compression Stockings Add-Ons Wound #2 (Foot) Wound Laterality: Dorsal, Left Cleanser Peri-Wound Care Topical Primary Dressing Secondary Dressing GASPER, HOPES ([id number]) [id number] Page 4 of 9 Secured With Compression Wrap Compression Stockings Facilities manager) Signed: 12/08/2022 10:22:00 AM By: [name] MD FACS Entered By: [name] on 12/08/2022 10:22:00 -------------------------------------------------------------------------------- Multi-Disciplinary Care Plan Details Patient Name: Date of Service: [place], [name] 12/08/2022 9:15 A M Medical Record Number: [id number] Patient Account Number: [id number] Date of Birth/Sex: Treating RN: June 28, 1956 (66 y.o. 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 and treatment procedures Potential for barotraumas to ears, sinuses, teeth, and lungs or cerebral gas embolism related to changes in atmospheric pressure inside hyperbaric oxygen chamber Potential for oxygen toxicity seizures related to delivery of 100% oxygen at an increased atmospheric pressure Potential for pulmonary oxygen toxicity related to delivery of 100% oxygen at an increased atmospheric pressure Goals: Barotrauma will be prevented during HBO2 Date Initiated: 10/06/2022 Target Resolution Date: 01/14/2023 Goal Status: Active Patient and/or family will be able to state/discuss factors appropriate to the management of their disease process during treatment Date Initiated: 10/06/2022 Target Resolution Date: 01/14/2023 Goal Status: Active Patient will tolerate the hyperbaric oxygen therapy treatment Date Initiated: 10/06/2022 Target Resolution Date: 01/14/2023 Goal Status: Active Patient/caregiver will verbalize understanding of HBO goals, rationale, procedures and potential hazards Date Initiated: 10/06/2022 Target Resolution Date: 01/14/2023 Goal Status: Active Interventions: Administer decongestants, per physician orders, prior to HBO2 Administer the correct therapeutic gas delivery based on the patients needs and limitations, per physician order Assess and provide for patients comfort related to the hyperbaric environment and equalization of middle ear Assess for signs and symptoms related to adverse  events, including but not limited to confinement anxiety, pneumothorax, oxygen toxicity and baurotrauma Notes: Wound/Skin Impairment Nursing Diagnoses: Impaired tissue integrity Goals: Patient/caregiver will verbalize understanding of skin care regimen Date Initiated: 10/06/2022 Target Resolution Date: 01/14/2023 Goal Status: Active JAILON, SCHAIBLE ([id number]) [phone].pdf Page 5 of 9 Ulcer/skin breakdown will have a volume  reduction of 30% by week 4 Date Initiated: 09/10/2022 Date Inactivated: 10/06/2022 Target Resolution Date: 10/08/2022 Goal Status: Unmet Unmet Reason: osteo, HBOT Ulcer/skin breakdown will have a volume reduction of 50% by week 8 Date Initiated: 10/06/2022 Target Resolution Date: 01/14/2023 Goal Status: Active Interventions: Assess ulceration(s) every visit Provide education on ulcer and skin care Treatment Activities: Consult for HBO : 09/10/2022 Skin care regimen initiated : 09/10/2022 Notes: Electronic Signature(s) Signed: 12/10/2022 12:15:24 PM By: Tommie Ard RN Entered By: Tommie Ard on 12/08/2022 09:20:05 -------------------------------------------------------------------------------- Pain Assessment Details Patient Name: Date of Service: [place], [name] 12/08/2022 9:15 A M Medical Record Number: [id number] Patient Account Number: [id number] Date of Birth/Sex: Treating RN: 09/28/1956 (66 y.o. [name] Primary Care Dorethy Tomey: Foye [name] Other Clinician: Referring Sheylin Scharnhorst: Treating Amzie Sillas/Extender: Kandis Cocking in Treatment: 12 Active Problems Location of Pain Severity and Description of Pain Patient Has Paino No Site Locations Pain Management and Medication Current Pain Management: Electronic Signature(s) Signed: 12/10/2022 12:15:24 PM By: Tommie Ard RN Entered By: Tommie Ard on 12/08/2022 09:19:14 Nona Dell R ([id number]) [id number] Page 6 of 9 -------------------------------------------------------------------------------- Patient/Caregiver Education Details Patient Name: Date of Service: [place], [name] 12/27/2023andnbsp9:15 A M Medical Record Number: [id number] Patient Account Number: [id number] Date of Birth/Gender: Treating RN: Mar 29, 1956 (66 y.o. [name] Primary Care Physician: Foye [name] Other Clinician: Referring Physician: Treating Physician/Extender:  Kandis Cocking in Treatment: 12 Education Assessment Education Provided To: Patient Education Topics Provided Wound Debridement: Methods: Explain/Verbal Responses: Reinforcements needed, State content correctly Wound/Skin Impairment: Methods: Explain/Verbal Responses: Reinforcements needed, State content correctly Electronic Signature(s) Signed: 12/10/2022 12:15:24 PM By: Tommie Ard RN Entered By: Tommie Ard on 12/08/2022 09:20:34 -------------------------------------------------------------------------------- Wound Assessment Details Patient Name: Date of Service: [place], [name] 12/08/2022 9:15 A M Medical Record Number: [id number] Patient Account Number: [id number] Date of Birth/Sex: Treating RN: 1956/11/30 (66 y.o. [name] Primary Care Shaquala Broeker: Foye [name] Other Clinician: Referring Orie Cuttino: Treating Clotilda Hafer/Extender: Kandis Cocking in Treatment: 12 Wound Status Wound Number: 1 Primary Diabetic Wound/Ulcer of the Lower Extremity Etiology: Wound Location: Left Calcaneus Wound Open Wounding Event: Pressure Injury Status: Date Acquired: 03/09/2019 Comorbid Cataracts, Sleep Apnea, Hypertension, Type II Diabetes, Weeks Of Treatment: 12 History: Osteomyelitis, Neuropathy Clustered Wound: No Photos DANNEY, BUNGERT ([id number]) [id number] Page 7 of 9 Wound Measurements Length: (cm) 5.3 Width: (cm) 4.5 Depth: (cm) 0.5 Area: (cm) 18.732 Volume: (cm) 9.366 % Reduction in Area: 13.3% % Reduction in Volume: 56.6% Epithelialization: Small (1-33%) Tunneling: No Undermining: No Wound Description Classification: Grade 3 Wound Margin: Fibrotic scar, thickened scar Exudate Amount: Medium Exudate Type: Serosanguineous Exudate Color: red, brown Foul Odor After Cleansing: No Slough/Fibrino Yes Wound Bed Granulation Amount: Large (67-100%) Exposed Structure Granulation  Quality: Red, Pink Fascia Exposed: No Necrotic Amount: Small (1-33%) Fat Layer (Subcutaneous Tissue) Exposed: Yes Necrotic Quality: Adherent Slough Tendon Exposed: No Muscle Exposed: No Joint Exposed: No Bone Exposed: No Periwound Skin Texture Texture Color No Abnormalities Noted: No No Abnormalities Noted: Yes Callus: Yes Temperature / Pain Temperature: No Abnormality Moisture No Abnormalities Noted: No Dry / Scaly: No Maceration: Yes Treatment Notes  Wound #1 (Calcaneus) Wound Laterality: Left Cleanser Peri-Wound Care Topical Primary Dressing Secondary Dressing Secured With Compression Wrap Compression Stockings Add-Ons Electronic Signature(s) Signed: 12/08/2022 9:31:34 AM By: Tommie ArdZochol, Jamie RN Entered By: Tommie ArdZochol, Jamie on 12/08/2022 09:31:33 -------------------------------------------------------------------------------- Wound Assessment Details Patient Name: [place]. 12/08/2022 9:15 A M Medical Record Number: [id number] Patient Account Number: [id number] Date of Birth/Sex: Treating RN: [birth year] (66 y.o. [name]) Zochol, Jamie Primary Care Ranon Coven: Foye DeerSchultz, Douglas Other Clinician: Referring Letica Giaimo: Treating Jaklyn Alen/Extender: Kandis Cockingannon, Jennifer Schultz, Douglas Weeks in Treatment: [address]12 Wound Status [name], Jovian R ([id number]) [id number] Page 8 of 9 Wound Number: 2 Primary Diabetic Wound/Ulcer of the Lower Extremity Etiology: Wound Location: Left, Dorsal Foot Wound Open Wounding Event: Pressure Injury Status: Date Acquired: 07/09/2022 Comorbid Cataracts, Sleep Apnea, Hypertension, Type II Diabetes, Weeks Of Treatment: 12 History: Osteomyelitis, Neuropathy Clustered Wound: No Photos Wound Measurements Length: (cm) 5 Width: (cm) 2.5 Depth: (cm) 0.1 Area: (cm) 9.817 Volume: (cm) 0.982 % Reduction in Area: -4.2% % Reduction in Volume: -4.2% Epithelialization: Medium (34-66%) Tunneling: No Undermining:  No Wound Description Classification: Grade 2 Wound Margin: Flat and Intact Exudate Amount: Medium Exudate Type: Serosanguineous Exudate Color: red, brown Foul Odor After Cleansing: No Slough/Fibrino Yes Wound Bed Granulation Amount: Medium (34-66%) Exposed Structure Granulation Quality: Pink Fascia Exposed: No Necrotic Amount: Medium (34-66%) Fat Layer (Subcutaneous Tissue) Exposed: Yes Necrotic Quality: Eschar, Adherent Slough Tendon Exposed: No Muscle Exposed: No Joint Exposed: No Bone Exposed: No Periwound Skin Texture Texture Color No Abnormalities Noted: Yes No Abnormalities Noted: Yes Moisture Temperature / Pain No Abnormalities Noted: Yes Temperature: No Abnormality Treatment Notes Wound #2 (Foot) Wound Laterality: Dorsal, Left Cleanser Peri-Wound Care Topical Primary Dressing Secondary Dressing Secured With Compression Wrap Compression Stockings Add-Ons Electronic Signature(s) Signed: 12/10/2022 12:15:24 PM By: Tommie ArdZochol, Jamie RN Entered By: Tommie ArdZochol, Jamie on 12/08/2022 09:28:01 Vernice JeffersonSMITH, Bridget R ([id number]) [id number] Page 9 of 9 -------------------------------------------------------------------------------- Vitals Details Patient Name: [place] of Service: [place]. 12/08/2022 9:15 A M Medical Record Number: [id number] Patient Account Number: [id number] Date of Birth/Sex: Treating RN: [birth year] (66 y.o. [name]) Zochol, Jamie Primary Care Malaisha Silliman: Foye DeerSchultz, Douglas Other Clinician: Referring Nashley Cordoba: Treating Marshell Dilauro/Extender: Kandis Cockingannon, Jennifer Schultz, Douglas Weeks in Treatment: 12 Vital Signs Time Taken: 09:18 Temperature (F): 98.3 Height (in): 74 Pulse (bpm): 92 Weight (lbs): 245 Respiratory Rate (breaths/min): 18 Body Mass Index (BMI): 31.5 Blood Pressure (mmHg): 124/74 Reference Range: 80 - 120 mg / dl Electronic Signature(s) Signed: 12/10/2022 12:15:24 PM By: Tommie ArdZochol, Jamie RN Entered By: Tommie ArdZochol, Jamie on  12/08/2022 09:18:57

## 2022-12-10 NOTE — Progress Notes (Signed)
[name], [name] ([id number]) [id number] Page 1 of 12 Visit Report for 12/08/2022 Chief Complaint Document Details Patient Name: Date of Service: [place], [place] [name]. 12/08/2022 9:15 A M Medical Record Number: [id number] Patient Account Number: [id number] Date of Birth/Sex: Treating RN: 1956/10/15 (66 y.[name]. M) Primary Care Provider: Nelda [name] Other Clinician: Referring Provider: Treating Provider/Extender: [name] in Treatment: 12 Information Obtained from: Patient Chief Complaint Patients presents for treatment of an open diabetic ulcer and evaluation for hyperbaric oxygen therapy Electronic Signature(s) Signed: 12/08/2022 10:22:10 AM By: [name] Maudlin MD FACS Entered By: [name] on 12/08/2022 10:22:09 -------------------------------------------------------------------------------- Debridement Details Patient Name: Date of Service: [name], [name] 12/08/2022 9:15 A M Medical Record Number: [id number] Patient Account Number: [id number] Date of Birth/Sex: Treating RN: 1955/12/17 (48 y.[name] Session Primary Care Provider: Nelda [name] Other Clinician: Referring Provider: Treating Provider/Extender: [name] in Treatment: 12 Debridement Performed for Assessment: Wound #2 Left,Dorsal Foot Performed By: Physician [name] Maudlin, MD Debridement Type: Debridement Severity of Tissue Pre Debridement: Fat layer exposed Level of Consciousness (Pre-procedure): Awake and Alert Pre-procedure Verification/Time Out Yes - 09:36 Taken: Start Time: 09:37 T Area Debrided (L x W): otal 5 (cm) x 2.5 (cm) = 12.5 (cm) Tissue and other material debrided: Viable, Non-Viable, Slough, Subcutaneous, Slough Level: Skin/Subcutaneous Tissue Debridement Description: Excisional Instrument: Curette Bleeding: Minimum Hemostasis Achieved: Pressure Response to Treatment: Procedure was  tolerated well Level of Consciousness (Post- Awake and Alert procedure): Post Debridement Measurements of Total Wound Length: (cm) 5 Width: (cm) 2.5 Depth: (cm) 0.1 Volume: (cm) 0.982 Character of Wound/Ulcer Post Debridement: Requires Further Debridement Severity of Tissue Post Debridement: Fat layer exposed Post Procedure Diagnosis Same as Pre-procedure [name], [name] ([id number]) [id number] Page 2 of 12 Notes Scribed for [name] by [name] East, RN Electronic Signature(s) Signed: 12/08/2022 12:13:53 PM By: [name] Maudlin MD FACS Signed: 12/10/2022 12:15:24 PM By: [name] East RN Entered By: [name] on 12/08/2022 09:38:47 -------------------------------------------------------------------------------- Debridement Details Patient Name: Date of Service: [name]. 12/08/2022 9:15 A M Medical Record Number: [id number] Patient Account Number: [id number] Date of Birth/Sex: Treating RN: [birth year] (7 y.[name] Session Primary Care Provider: Nelda [name] Other Clinician: Referring Provider: Treating Provider/Extender: [name] in Treatment: 12 Debridement Performed for Assessment: Wound #1 Left Calcaneus Performed By: Physician [name] Maudlin, MD Debridement Type: Debridement Severity of Tissue Pre Debridement: Fat layer exposed Level of Consciousness (Pre-procedure): Awake and Alert Pre-procedure Verification/Time Out Yes - 09:36 Taken: Start Time: 09:37 T Area Debrided (L x W): otal 5.3 (cm) x 4.5 (cm) = 23.85 (cm) Tissue and other material debrided: Viable, Non-Viable, Callus, Slough, Skin: Epidermis, Slough Level: Skin/Epidermis Debridement Description: Selective/Open Wound Instrument: Curette Bleeding: Minimum Hemostasis Achieved: Pressure Response to Treatment: Procedure was tolerated well Level of Consciousness (Post- Awake and Alert procedure): Post Debridement Measurements  of Total Wound Length: (cm) 5.3 Width: (cm) 4.5 Depth: (cm) 0.5 Volume: (cm) 9.366 Character of Wound/Ulcer Post Debridement: Requires Further Debridement Severity of Tissue Post Debridement: Fat layer exposed Post Procedure Diagnosis Same as Pre-procedure Notes Scribed for [name] by [name] East, RN Electronic Signature(s) Signed: 12/08/2022 12:13:53 PM By: [name] Maudlin MD FACS Signed: 12/10/2022 12:15:24 PM By: [name] East RN Entered By: [name] on 12/08/2022 09:42:38 -------------------------------------------------------------------------------- HPI Details Patient Name: Date of Service: [name], [name] 12/08/2022 9:15 A M Medical Record Number: [id number] Patient Account Number: [id number] [name], [name] ([id number]) [id number] Page  3 of 12 Date of Birth/Sex: Treating RN: [birth year] (36 y.[name]. M) Primary Care Provider: Other Clinician: Nelda [name] Referring Provider: Treating Provider/Extender: [name] in Treatment: 12 History of Present Illness HPI Description: ADMISSION 09/10/2022 This is a 66 year old poorly controlled type II diabetic (last hemoglobin A1c 10.8%) who has had an ulcer on his heel for over 3 years. He has been seen in multiple wound care centers, including [facility] and [facility]. He reports that at least 3 doctors have recommended that he undergo below-knee amputation. He most recently met with [name], a vascular surgeon affiliated with [facility]. 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 Unfortunately, the patient has some extenuating social circumstances including the fact that he cares for his wife who has stage IV colon cancer and still works, driving vehicles for [employer]. 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 He was recently hospitalized  at [facility] for sepsis and currently has a PICC line through which he receives IV antibiotics. He reports having had another MRI during that hospital stay along with a chest x-ray and EKG. He apparently contacted one of the hyperbaric therapy techs here and asked a number of questions about hyperbaric oxygen treatments. He subsequently self-referred to our center to undergo further evaluation and management. I mention to him that [facility] actually has hyperbaric chambers, but he states that he lives in [place] and this would be more convenient for him given the intensive nature of the therapy and time requirement. ABI in clinic today was 0.94. The patient actually has 2 wounds. There is a wound on the dorsum of his left foot with heavy black eschar and slough present. After debridement, this was demonstrated to involve the muscle and the extensor tendons are exposed. On his heel, there is essentially a "shark bite" type wound, with much of the heel fat pad absent. The muscle layer is exposed. There is blue-green staining around the perimeter of the wound, but no significant odor. He says he has been applying collagen to the wound on his heel and Silvadene and Betadine to the wound on his dorsal foot. 09/20/2022: The heel wound is quite macerated with wet periwound callus. There is slough accumulation on the surface. The dorsal foot wound looks better this week. There is still exposed tendon, but it is fairly clean with just a little biofilm buildup. We are still working on gathering the required documentation to submit for pretreatment review for hyperbaric oxygen therapy. 09/29/2022: The dorsal foot wound continues to improve. I do not see any exposed tendon at this point. There is just some slough accumulation on the wound surface. He continues to have very wet macerated periwound callus on his heel. There is slough on the surface, but it is loose and thin. There is an area of undermining  at the 11 [name]'clock position, but the overlying skin and subcutaneous tissue is healthy and viable. He has been approved for hyperbaric oxygen therapy and will start treatment tomorrow. 10/06/2022: Continued contraction and improvement of the dorsal foot wound. There is just a little bit of slough accumulation on the wound surface. The periwound callus continues to accumulate on the heel wound and it is quite macerated. It is also persistently found with blue-green staining present. He did initiate his hyperbaric oxygen therapy, but has had significant difficulty with decompression. He will be going to ENT to  have PE tubes placed. 10/18/2022: His hyperbaric oxygen therapy is on hold while his otological issues are being addressed. He came in again today with the periwound skin on both the dorsal aspect of his foot and his calcaneus completely macerated. The blue-green discoloration, however, has abated and the undermining on the calcaneus has improved. The dorsal foot wound has also contracted somewhat. 10/26/2022: The dorsal foot wound continues to contract and fill with good granulation tissue. The heel, once again, has a rim of macerated callus, but the undermining and tunneling continues to contract. He has completed his oral antibiotics and has been using the [name] topical compounded antibiotic for his dressing changes at home. He is scheduled to see ENT this afternoon. 11/08/2022: The dorsal foot wound is flush with the surrounding skin and has a good granulation tissue surface. There is some slough accumulation. As usual, the heel has a rim of macerated callus but the dimensions are smaller and the tunneling and undermining have contracted further. He has resumed his hyperbaric oxygen therapy. 12/12; patient seen for wound evaluation. He is tolerating HBO although we could not dive him yesterday because of relative hypoglycemia and the fact he had given himself NovoLog insulin before he came to  clinic. Today his blood sugar is in the 180 range she should be fine. He has a large wound on the plantar calcaneus on the right and a more superficial area on the dorsal foot. He is using a scooter for offloading 11/30/2022: The dorsal foot wound continues to contract. There is some slough on the wound surface. The large calcaneus wound has heaped up wet callus around the margins. Continued undermining. 12/08/2022: The dorsal foot wound is flat and flush with the surrounding skin surface. There is some slough present. Once again, the calcaneal wound has thick, absolutely macerated callus hanging off in tatters around the edges. The patient cannot explain to me why this part of his wound gets so wet. There is slough on the surface. The undermined portion of the wound has filled in. Electronic Signature(s) Signed: 12/08/2022 10:23:33 AM By: [name] Maudlin MD FACS Entered By: [name] on 12/08/2022 10:23:33 -------------------------------------------------------------------------------- Physical Exam Details Patient Name: Date of Service: [place], [name] 12/08/2022 9:15 A M Medical Record Number: [id number] Patient Account Number: [id number] Date of Birth/Sex: Treating RN: [birth year] (41 y.[name]. M) Primary Care Provider: Nelda [name] Other Clinician: Referring Provider: Treating Provider/Extender: [name] in Treatment: [address], [place] ([id number]) [id number] Page 4 of 12 Constitutional . . . . No acute distress. Respiratory Normal work of breathing on room air. Notes 12/08/2022: The dorsal foot wound is flat and flush with the surrounding skin surface. There is some slough present. Once again, the calcaneal wound has thick, absolutely macerated callus hanging off in tatters around the edges. There is slough on the surface. The undermined portion of the wound has filled in. Electronic Signature(s) Signed:  12/08/2022 10:24:47 AM By: [name] Maudlin MD FACS Entered By: [name] on 12/08/2022 10:24:47 -------------------------------------------------------------------------------- Physician Orders Details Patient Name: Date of Service: [place], [name] 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 - [name] Rm 4 Return A Edema Control - Lymphedema / SCD / Other Left Lower Extremity Avoid standing for long periods of time. Hyperbaric Oxygen Therapy Wound #1 Left Calcaneus Evaluate for HBO Therapy Indication: - chronic refractory osteomyelitis If appropriate for treatment, begin HBOT per protocol: 2.5 ATA for 90 Minutes with 2 Five (5) Minute A Breaks ir Total Number of Treatments: - 40 One treatments per day (delivered Monday through Friday unless otherwise specified in Special Instructions below): Finger stick Blood Glucose Pre- and Post- HBOT Treatment. Follow Hyperbaric Oxygen Glycemia Protocol A frin (Oxymetazoline HCL) 0.05% nasal spray - 1 spray in both nostrils daily as needed prior to HBO treatment for difficulty clearing ears Wound Treatment Wound #1 - Calcaneus Wound Laterality: Left Cleanser: Normal Saline Discharge Instructions: Cleanse the wound  with Normal Saline prior to applying a clean dressing using gauze sponges, not tissue or cotton balls. Cleanser: Soap and Water Discharge Instructions: May shower and wash wound with dial antibacterial soap and water prior to dressing change. Peri-Wound Care: keystone Prim Dressing: Sorbal[place] [employer], 4x4 (in/in) ary Discharge Instructions: Apply to wound bed as instructed Secondary Dressing: Woven Gauze Sponge, Non-Sterile 4x4 in [place] ([id number]) [phone].pdf Page 5 of 12 Discharge Instructions: Apply over primary dressing as directed. Secondary Dressing: Zetuvit Plus 4x8 in Discharge Instructions: Apply over primary dressing as directed. Secured With: Elastic Bandage 4 inch (ACE bandage) Discharge Instructions: Secure with ACE bandage as directed. Secured With: [employer], 4.5x3.1 (in/yd) Discharge Instructions: Secure with Kerlix as directed. Wound #2 - Foot Wound Laterality: Dorsal, Left Cleanser: Normal Saline Discharge Instructions: Cleanse the wound with Normal Saline prior to applying a clean dressing using gauze sponges, not tissue or cotton balls. Cleanser: Soap and Water Discharge Instructions: May shower and wash wound with dial antibacterial soap and water prior to dressing change. Peri-Wound Care: keystone Prim Dressing: [place] [employer], 4x4 (in/in) ary Discharge Instructions: Apply to wound bed as instructed Secondary Dressing: Woven Gauze Sponge, Non-Sterile 4x4 in Discharge Instructions: Apply over primary dressing as directed. Secondary Dressing: Zetuvit Plus 4x8 in Discharge Instructions: Apply over primary dressing as directed. Secured With: Elastic Bandage 4 inch (ACE bandage) Discharge Instructions: Secure with ACE bandage as directed. Secured With: [employer], 4.5x3.1 (in/yd) Discharge Instructions: Secure with Kerlix as directed. GLYCEMIA INTERVENTIONS PROTOCOL PRE-HBO GLYCEMIA  INTERVENTIONS ACTION INTERVENTION Obtain pre-HBO capillary blood glucose (ensure 1 physician order is in chart). A. Notify HBO physician and await physician orders. 2 If result is 70 mg/dl or below: B. If the result meets the hospital definition of a critical result, follow hospital policy. A. Give patient an 8 ounce Glucerna Shake, an 8 ounce Ensure, or 8 ounces of a Glucerna/Ensure equivalent dietary supplement*. B. Wait 30 minutes. If result is 71 mg/dl to 130 mg/dl: C. Retest patients capillary blood glucose (CBG). D. If result greater than or equal to 110 mg/dl, proceed with HBO. If result less than 110 mg/dl, notify HBO physician and consider holding HBO. If result is 131 mg/dl to 249 mg/dl: A. Proceed with HBO. A. Notify HBO physician and await physician orders. B. It is recommended to hold HBO and do If result is 250 mg/dl or greater: blood/urine ketone testing. C. If the result  meets the hospital definition of a critical result, follow hospital policy. POST-HBO GLYCEMIA INTERVENTIONS ACTION INTERVENTION Obtain post HBO capillary blood glucose (ensure 1 physician order is in chart). A. Notify HBO physician and await physician orders. 2 If result is 70 mg/dl or below: B. If the result meets the hospital definition of a critical result, follow hospital policy. A. Give patient an 8 ounce Glucerna Shake, an 8 ounce Ensure, or 8 ounces of a Glucerna/Ensure equivalent dietary supplement*. B. Wait 15 minutes for symptoms of If result is 71 mg/dl to 100 mg/dl: hypoglycemia (i.e. nervousness, anxiety, sweating, chills, clamminess, irritability, confusion, tachycardia or dizziness). C. If patient asymptomatic, discharge patient. If patient symptomatic, repeat capillary blood glucose (CBG) and notify HBO physician. ANTINO, MAYABB [name] ([id number]) [id number] Page 6 of 12 If result is 101 mg/dl to 249 mg/dl: A. Discharge patient. A.  Notify HBO physician and await physician orders. B. It is recommended to do blood/urine ketone If result is 250 mg/dl or greater: testing. C. If the result meets the hospital definition of a critical result, follow hospital policy. *Juice or candies are NOT equivalent products. If patient refuses the Glucerna or Ensure, please consult the hospital dietitian for an appropriate substitute. Electronic Signature(s) Signed: 12/08/2022 12:13:53 PM By: [name] Maudlin MD FACS Entered By: [name] on 12/08/2022 10:25:04 -------------------------------------------------------------------------------- Problem List Details Patient Name: Date of Service: [place], [name] 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 12/08/2022 9:15 A  M Medical Record Number: [id number] Patient Account Number: [id number] Date of Birth/Sex: Treating RN: 02-07-1956 (89 y.[name]. M) Primary Care Provider: Nelda [name] Other Clinician: Referring Provider: Treating Provider/Extender: [name] in Treatment: 12 Subjective Chief Complaint Information obtained from Patient Patients presents for treatment of an open diabetic ulcer and evaluation for hyperbaric oxygen therapy History of Present Illness (HPI) ADMISSION 09/10/2022 This is a 66 year old poorly controlled type II diabetic (last hemoglobin A1c 10.8%) who has had an ulcer on his heel for over 3 years. He has been seen in multiple wound care centers, including [facility] and [facility]. He reports that at least 3 doctors have recommended that he undergo below-knee amputation. He most recently met with [name], a vascular surgeon affiliated with [facility]. 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 Unfortunately, the patient has some extenuating social circumstances including the fact that he cares for his wife who has stage IV colon cancer and still works, driving vehicles for [employer]. He has had at least 1 MRI that demonstrates osteomyelitis  of the calcaneus. He was recently hospitalized at [facility] for sepsis and currently has a PICC line through which he receives IV antibiotics. He reports having had another MRI during that hospital stay along with a chest x-ray and EKG. He apparently contacted one of the hyperbaric therapy techs here and asked a number of questions about hyperbaric oxygen treatments. He subsequently self-referred to our center to undergo further evaluation and management. I mention to him that [facility] actually has hyperbaric chambers, but he states that he lives in [place] and this would be more convenient for him given the intensive  nature of the therapy and time requirement. ABI in clinic today was 0.94. The patient actually has 2 wounds. There is a wound on the dorsum of his left foot with heavy black eschar and slough present. After debridement, this was demonstrated to involve the muscle and the extensor tendons are exposed. On his heel, there is essentially a "shark bite" type wound, with much of the heel fat pad absent. The muscle layer is exposed. There is blue-green staining around the perimeter of the wound, but no significant odor. He says he has been applying collagen to the wound on his heel and Silvadene and Betadine to the wound on his dorsal foot. 09/20/2022: The heel wound is quite macerated with wet periwound callus. There is slough accumulation on the surface. The dorsal foot wound looks better this week. There is still exposed tendon, but it is fairly clean with just a little biofilm buildup. We are still working on gathering the required documentation to submit for pretreatment review for hyperbaric oxygen therapy. 09/29/2022: The dorsal foot wound continues to improve. I do not see any exposed tendon at this point. There is just some slough accumulation on the wound surface. He continues to have very wet macerated periwound callus on his heel. There is slough on the surface, but it is loose and thin. There is an area of undermining at the 11 [name]'clock position, but the overlying skin and subcutaneous tissue is healthy and viable. He has been approved for hyperbaric oxygen therapy and will start treatment tomorrow. 10/06/2022: Continued contraction and improvement of the dorsal foot wound. There is just a little bit of slough accumulation on the wound surface. The periwound callus continues to accumulate on the heel wound and it is quite macerated. It is also persistently found with blue-green staining present. He did initiate his hyperbaric oxygen therapy, but has had significant difficulty with decompression. He  will be going to ENT to have PE tubes placed. 10/18/2022: His hyperbaric oxygen therapy is on hold while his otological issues are being addressed. He came in again today with the periwound skin on both the dorsal aspect of his foot and his calcaneus completely macerated. The blue-green discoloration, however, has abated and the undermining on the calcaneus has improved. The dorsal foot wound has also contracted somewhat. 10/26/2022: The dorsal foot wound continues to contract and fill with good granulation tissue. The heel, once again, has a rim of macerated callus, but the undermining and tunneling continues to contract. He has completed his oral antibiotics and has been using the [facility] topical compounded antibiotic for his dressing changes at home. He is scheduled to see ENT this afternoon. 11/08/2022: The dorsal foot wound is flush with the surrounding skin and has a good granulation tissue surface. There is some slough accumulation. As usual, the heel has a rim of macerated callus but the dimensions are smaller and the tunneling and  undermining have contracted further. He has resumed his hyperbaric oxygen therapy. 12/12; patient seen for wound evaluation. He is tolerating HBO although we could not dive him yesterday because of relative hypoglycemia and the fact he had given himself NovoLog insulin before he came to clinic. Today his blood sugar is in the 180 range she should be fine. He has a large wound on the plantar calcaneus on the right and a more superficial area on the dorsal foot. He is using a scooter for offloading 11/30/2022: The dorsal foot wound continues to contract. There is some slough on the wound surface. The large calcaneus wound has heaped up wet callus around the margins. Continued undermining. 12/08/2022: The dorsal foot wound is flat and flush with the surrounding skin surface. There is some slough present. Once again, the calcaneal wound has thick, absolutely macerated  callus hanging off in tatters around the edges. The patient cannot explain to me why this part of his wound gets so wet. There is slough on the surface. The undermined portion of the wound has filled in. Patient History Family History Cancer - Father,Mother, Diabetes - Mother,Father, Lung Disease - Father, Thyroid Problems - Mother, No family history of Heart Disease, Hereditary Spherocytosis, Hypertension, Kidney Disease, Seizures, Stroke, Tuberculosis. Social History Never smoker, Marital Status - Married, Alcohol Use - Never, Drug Use - No History, Caffeine Use - Daily. Medical History Eyes Patient has history of Cataracts Denies history of Glaucoma, Optic Neuritis Ear/Nose/Mouth/Throat Denies history of Chronic sinus problems/congestion, Middle ear problems Respiratory Patient has history of Sleep Apnea Cardiovascular Patient has history of Hypertension [name], [name] ([id number]) [id number] Page 8 of 12 Gastrointestinal Denies history of Cirrhosis , Colitis, Crohnoos, Hepatitis A, Hepatitis B, Hepatitis C Endocrine Patient has history of Type II Diabetes Immunological Denies history of Lupus Erythematosus, Raynaudoos, Scleroderma Musculoskeletal Patient has history of Osteomyelitis - 2023 Neurologic Patient has history of Neuropathy - Bila lower extremities Oncologic Denies history of Received Chemotherapy, Received Radiation Psychiatric Denies history of Anorexia/bulimia, Confinement Anxiety Hospitalization/Surgery History - I and D Left calcaneus. - back surgery- laminectomy. - eye surgery- Bila cataracts. - shoulder arthroscopy. Medical A Surgical History Notes nd Cardiovascular hyperlipidemia Genitourinary AKI Objective Constitutional No acute distress. Vitals Time Taken: 9:18 AM, Height: 74 in, Weight: 245 lbs, BMI: 31.5, Temperature: 98.3 F, Pulse: 92 bpm, Respiratory Rate: 18 breaths/min, Blood Pressure: 124/74  mmHg. Respiratory Normal work of breathing on room air. General Notes: 12/08/2022: The dorsal foot wound is flat and flush with the surrounding skin surface. There is some slough present. Once again, the calcaneal wound has thick, absolutely macerated callus hanging off in tatters around the edges. There is slough on the surface. The undermined portion of the wound has filled in. Integumentary (Hair, Skin) Wound #1 status is Open. Original cause of wound was Pressure Injury. The date acquired was: 03/09/2019. The wound has been in treatment 12 weeks. The wound is located on the Left Calcaneus. The wound measures 5.3cm length x 4.5cm width x 0.5cm depth; 18.732cm^2 area and 9.366cm^3 volume. There is Fat Layer (Subcutaneous Tissue) exposed. There is no tunneling or undermining noted. There is a medium amount of serosanguineous drainage noted. The wound margin is fibrotic, thickened scar. There is large (67-100%) red, pink granulation within the wound bed. There is a small (1-33%) amount of necrotic tissue within the wound bed including Adherent Slough. The periwound skin appearance had no abnormalities noted for color. The periwound skin appearance exhibited: Callus, Maceration. The periwound skin appearance  did not exhibit: Dry/Scaly. Periwound temperature was noted as No Abnormality. Wound #2 status is Open. Original cause of wound was Pressure Injury. The date acquired was: 07/09/2022. The wound has been in treatment 12 weeks. The wound is located on the Left,Dorsal Foot. The wound measures 5cm length x 2.5cm width x 0.1cm depth; 9.817cm^2 area and 0.982cm^3 volume. There is Fat Layer (Subcutaneous Tissue) exposed. There is no tunneling or undermining noted. There is a medium amount of serosanguineous drainage noted. The wound margin is flat and intact. There is medium (34-66%) pink granulation within the wound bed. There is a medium (34-66%) amount of necrotic tissue within the wound bed including  Eschar and Adherent Slough. The periwound skin appearance had no abnormalities noted for texture. The periwound skin appearance had no abnormalities noted for moisture. The periwound skin appearance had no abnormalities noted for color. Periwound temperature was noted as No Abnormality. 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 There was a Selective/Open Wound Skin/Epidermis Debridement with a total area of 23.85 sq cm performed by [name] Maudlin, MD. With the following instrument(s): Curette to remove Viable and Non-Viable tissue/material. Material removed includes Callus, Slough, and Skin: Epidermis. No specimens were taken. A time out was conducted at 09:36, prior to the start of the procedure. A Minimum amount of bleeding was controlled with Pressure. The procedure was tolerated well. Post Debridement Measurements: 5.3cm length x 4.5cm width x 0.5cm depth; 9.366cm^3 volume. Character of Wound/Ulcer Post Debridement requires further debridement. Severity of Tissue Post Debridement is: Fat layer exposed. Post procedure Diagnosis Wound #1: Same as Pre-Procedure General Notes: Scribed for [name] by [name] East, RN. 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 There was a Excisional Skin/Subcutaneous Tissue  Debridement with a total area of 12.5 sq cm performed by [name] Maudlin, MD. With the following instrument(s): Curette to remove Viable and Non-Viable tissue/material. Material removed includes Subcutaneous Tissue and Slough and. A time out was conducted at 09:36, prior to the start of the procedure. A Minimum amount of bleeding was controlled with Pressure. The procedure was tolerated well. Post Debridement Measurements: 5cm length x 2.5cm width x 0.1cm depth; 0.982cm^3 volume. Character of Wound/Ulcer Post Debridement requires further debridement. Severity of Tissue Post Debridement is: Fat layer exposed. Post procedure Diagnosis Wound #2: Same as Pre-Procedure General Notes: Scribed for [name] by [name] East, RN. Plan Follow-up Appointments: Return Appointment in 1 week. - [name] Rm 4 Edema Control - Lymphedema / SCD / Other: Avoid standing for long periods of time. Hyperbaric Oxygen Therapy: Wound #1 Left Calcaneus: Evaluate for HBO Therapy Indication: - chronic refractory osteomyelitis If appropriate for treatment, begin HBOT per protocol: 2.5 ATA for 90 Minutes with 2 Five (5) Minute Air Breaks T Number of Treatments: - 40 otal One treatments per day (delivered Monday through Friday unless otherwise specified in Special Instructions below): Finger stick Blood Glucose Pre- and  Post- HBOT Treatment. Follow Hyperbaric Oxygen Glycemia Protocol Afrin (Oxymetazoline HCL) 0.05% nasal spray - 1 spray in both nostrils daily as needed prior to HBO treatment for difficulty clearing ears WOUND #1: - Calcaneus Wound Laterality: Left Cleanser: Normal Saline Discharge Instructions: Cleanse the wound with Normal Saline prior to applying a clean dressing using gauze sponges, not tissue or cotton balls. Cleanser: Soap and Water Discharge Instructions: May shower and wash wound with dial antibacterial soap and water prior to dressing change. Peri-Wound Care: keystone Prim Dressing:  [place] [employer], 4x4 (in/in) ary Discharge Instructions: Apply to wound bed as instructed Secondary Dressing: Woven Gauze Sponge, Non-Sterile 4x4 in Discharge Instructions: Apply over primary dressing as directed. Secondary Dressing: Zetuvit Plus 4x8 in Discharge Instructions: Apply over primary dressing as directed. Secured With: Elastic Bandage 4 inch (ACE bandage) Discharge Instructions: Secure with ACE bandage as directed. Secured With: [employer], 4.5x3.1 (in/yd) Discharge Instructions: Secure with Kerlix as directed. WOUND #2: - Foot Wound Laterality: Dorsal, Left Cleanser: Normal Saline Discharge Instructions: Cleanse the wound with Normal Saline prior to applying a clean dressing using gauze sponges, not tissue or cotton balls. Cleanser: Soap and Water Discharge Instructions: May shower and wash wound with dial antibacterial soap and water prior to dressing change. Peri-Wound Care: keystone Prim Dressing: [place] [employer], 4x4 (in/in) ary Discharge Instructions: Apply to wound bed as instructed Secondary Dressing: Woven Gauze Sponge, Non-Sterile 4x4 in Discharge Instructions: Apply over primary dressing as directed. Secondary Dressing: Zetuvit Plus 4x8 in Discharge Instructions: Apply over primary dressing as directed. Secured With: Elastic Bandage 4 inch (ACE bandage) Discharge Instructions: Secure with ACE bandage as directed. Secured With: [employer], 4.5x3.1 (in/yd) Discharge Instructions: Secure with Kerlix as directed. 12/08/2022: The dorsal foot wound is flat and flush with the surrounding skin surface. There is some slough present. Once again, the calcaneal wound has thick, absolutely macerated callus hanging off in tatters around the edges. There is slough on the surface. The undermined portion of the wound has filled in. I used a curette to debride slough and nonviable subcutaneous tissue from the dorsal foot wound. I debrided callus, skin,  and slough from the heel wound. He did not bring his Keystone topical antibiotic with him today so we will apply topical gentamicin in its stead. Continue silver alginate to both sites. Continue hyperbaric oxygen therapy. Follow-up in 1 week. Electronic Signature(s) Signed: 12/08/2022 10:26:49 AM By: [name] Maudlin MD FACS Entered By: [name] on 12/08/2022 10:26:49 [name] ([id number]) [id number] Page 10 of 12 -------------------------------------------------------------------------------- HxROS Details Patient Name: Date of Service: [place], [place] [name]. 12/08/2022 9:15 A M Medical Record Number: [id number] Patient Account Number: [id number] Date of Birth/Sex: Treating RN: Dec 13, 1956 (26 y.[name]. M) Primary Care Provider: Nelda [name] Other Clinician: Referring Provider: Treating Provider/Extender: [name] in Treatment: 12 Eyes Medical History: Positive for: Cataracts Negative for: Glaucoma; Optic Neuritis Ear/Nose/Mouth/Throat Medical History: Negative for: Chronic sinus problems/congestion; Middle ear problems Respiratory Medical History: Positive for: Sleep Apnea Cardiovascular Medical History: Positive for: Hypertension Past Medical History Notes: hyperlipidemia Gastrointestinal Medical History: Negative for: Cirrhosis ; Colitis; Crohns; Hepatitis A; Hepatitis B; Hepatitis C Endocrine Medical History: Positive for: Type II Diabetes Time with diabetes: 20 years Treated with: Insulin Blood sugar tested every day: Yes Tested : 2-3 Genitourinary Medical History: Past Medical History Notes: AKI Immunological Medical History: Negative for: Lupus Erythematosus; Raynauds; Scleroderma Musculoskeletal Medical History: Positive for: Osteomyelitis - 2023 Neurologic Medical History: Positive for: Neuropathy -  Bila lower extremities Oncologic Medical History: Negative for: Received  Chemotherapy; Received Radiation [name], [name] ([id number]) [id number] Page 11 of 12 Psychiatric Medical History: Negative for: Anorexia/bulimia; Confinement Anxiety HBO Extended History Items Eyes: Cataracts Immunizations Pneumococcal Vaccine: Received Pneumococcal Vaccination: Yes Received Pneumococcal Vaccination On or After 60th Birthday: Yes Implantable Devices Yes Hospitalization / Surgery History Type of Hospitalization/Surgery I and D Left calcaneus back surgery- laminectomy eye surgery- Bila cataracts shoulder arthroscopy Family and Social History Cancer: Yes - Father,Mother; Diabetes: Yes - Mother,Father; Heart Disease: No; Hereditary Spherocytosis: No; Hypertension: No; Kidney Disease: No; Lung Disease: Yes - Father; Seizures: No; Stroke: No; Thyroid Problems: Yes - Mother; Tuberculosis: No; Never smoker; Marital Status - Married; Alcohol Use: Never; Drug Use: No History; Caffeine Use: Daily; Financial Concerns: No; Food, Clothing or Shelter Needs: No; Support System Lacking: No; Transportation Concerns: No Engineer, maintenance) Signed: 12/08/2022 12:13:53 PM By: [name] Maudlin MD FACS Entered By: [name] on 12/08/2022 10:23:40 -------------------------------------------------------------------------------- SuperBill Details Patient Name: Date of Service: [name], [name] 12/08/2022 Medical Record Number: [id number] Patient Account Number: [id number] Date of Birth/Sex: Treating RN: 07-28-1956 (25 y.[name]. 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.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, left  ankle and foot E11.65 Type 2 diabetes mellitus with hyperglycemia E11.621 Type 2 diabetes mellitus with foot ulcer Facility Procedures : The patient participates with Medicare or their insurance follows the Medicare Facility Guidelines: CPT4 Code Description Modifier Quantity [id number] [place] TISSUE 20 SQ CM/< 1 ICD-10 Diagnosis Description L97.523 Non-pressure chronic ulcer of  other part of left foot with necrosis of muscle : Reasor, [name] The patient participates with Medicare or their insurance follows the Medicare Facility Guidelines: [id number] - DEBRIDE WOUND 1ST 20 SQ CM OR < 1 ICD-10 Diagnosis Description ONALD [name] ([id number]) [id number] Page 12 of 12  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 : The patient participates with Medicare or their insurance follows the Medicare Facility Guidelines: [id number] - DEBRIDE WOUND EA ADDL 20 SQ CM 1 ICD-10 Diagnosis Description 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 Physician Procedures : CPT4 Code Description Modifier [id number] - WC PHYS LEVEL 4 - EST PT 25 ICD-10 Diagnosis Description 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, 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 Quantity: 1 : [id number] - WC PHYS SUBQ TISS 20 SQ CM ICD-10 Diagnosis Description L97.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 Quantity: 1 : [id number] - WC PHYS DEBR WO ANESTH 20 SQ CM ICD-10 Diagnosis Description 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

## 2022-12-14 ENCOUNTER — Encounter (HOSPITAL_BASED_OUTPATIENT_CLINIC_OR_DEPARTMENT_OTHER): Payer: Medicare Other | Attending: General Surgery | Admitting: General Surgery

## 2022-12-14 ENCOUNTER — Emergency Department (HOSPITAL_COMMUNITY)
Admission: EM | Admit: 2022-12-14 | Discharge: 2022-12-14 | Disposition: A | Discharge status: Home / Self Care | Payer: Medicare Other | Attending: Emergency Medicine | Admitting: Emergency Medicine

## 2022-12-14 ENCOUNTER — Encounter (HOSPITAL_COMMUNITY): Payer: Self-pay

## 2022-12-14 ENCOUNTER — Emergency Department (HOSPITAL_COMMUNITY): Payer: Medicare Other

## 2022-12-14 DIAGNOSIS — L723 Sebaceous cyst: Secondary | ICD-10-CM | POA: Diagnosis not present

## 2022-12-14 DIAGNOSIS — R600 Localized edema: Secondary | ICD-10-CM | POA: Insufficient documentation

## 2022-12-14 DIAGNOSIS — Z833 Family history of diabetes mellitus: Secondary | ICD-10-CM | POA: Insufficient documentation

## 2022-12-14 DIAGNOSIS — L02413 Cutaneous abscess of right upper limb: Secondary | ICD-10-CM | POA: Insufficient documentation

## 2022-12-14 DIAGNOSIS — E11621 Type 2 diabetes mellitus with foot ulcer: Secondary | ICD-10-CM | POA: Diagnosis present

## 2022-12-14 DIAGNOSIS — E11622 Type 2 diabetes mellitus with other skin ulcer: Secondary | ICD-10-CM | POA: Diagnosis not present

## 2022-12-14 DIAGNOSIS — L97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: Secondary | ICD-10-CM | POA: Insufficient documentation

## 2022-12-14 DIAGNOSIS — R7989 Other specified abnormal findings of blood chemistry: Secondary | ICD-10-CM | POA: Insufficient documentation

## 2022-12-14 DIAGNOSIS — I959 Hypotension, unspecified: Secondary | ICD-10-CM | POA: Diagnosis not present

## 2022-12-14 DIAGNOSIS — M86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, left ankle and foot: Secondary | ICD-10-CM | POA: Insufficient documentation

## 2022-12-14 DIAGNOSIS — E1140 Type 2 diabetes mellitus with diabetic neuropathy, unspecified: Secondary | ICD-10-CM | POA: Diagnosis not present

## 2022-12-14 DIAGNOSIS — Z20822 Contact with and (suspected) exposure to covid-19: Secondary | ICD-10-CM | POA: Insufficient documentation

## 2022-12-14 DIAGNOSIS — Z7982 Long term (current) use of aspirin: Secondary | ICD-10-CM | POA: Diagnosis not present

## 2022-12-14 DIAGNOSIS — L97529 Non-pressure chronic ulcer of other part of left foot with unspecified severity: Secondary | ICD-10-CM | POA: Insufficient documentation

## 2022-12-14 DIAGNOSIS — E1165 Type 2 diabetes mellitus with hyperglycemia: Secondary | ICD-10-CM | POA: Insufficient documentation

## 2022-12-14 DIAGNOSIS — L0291 Cutaneous abscess, unspecified: Secondary | ICD-10-CM

## 2022-12-14 DIAGNOSIS — L97524 Non-pressure chronic ulcer of other part of left foot with necrosis of bone: Secondary | ICD-10-CM | POA: Diagnosis not present

## 2022-12-14 DIAGNOSIS — R531 Weakness: Secondary | ICD-10-CM | POA: Insufficient documentation

## 2022-12-14 DIAGNOSIS — E1169 Type 2 diabetes mellitus with other specified complication: Secondary | ICD-10-CM | POA: Diagnosis not present

## 2022-12-14 DIAGNOSIS — L98492 Non-pressure chronic ulcer of skin of other sites with fat layer exposed: Secondary | ICD-10-CM | POA: Diagnosis not present

## 2022-12-14 DIAGNOSIS — E860 Dehydration: Secondary | ICD-10-CM | POA: Diagnosis not present

## 2022-12-14 LAB — COMPREHENSIVE METABOLIC PANEL
ALT: 10 U/L (ref 0–44)
AST: 12 U/L — ABNORMAL LOW (ref 15–41)
Albumin: 3.8 g/dL (ref 3.5–5.0)
Alkaline Phosphatase: 92 U/L (ref 38–126)
Anion gap: 10 (ref 5–15)
BUN: 24 mg/dL — ABNORMAL HIGH (ref 8–23)
CO2: 26 mmol/L (ref 22–32)
Calcium: 9.3 mg/dL (ref 8.9–10.3)
Chloride: 98 mmol/L (ref 98–111)
Creatinine, Ser: 1.63 mg/dL — ABNORMAL HIGH (ref 0.61–1.24)
GFR, Estimated: 46 mL/min — ABNORMAL LOW (ref >60)
Glucose, Bld: 143 mg/dL — ABNORMAL HIGH (ref 70–99)
Potassium: 4.3 mmol/L (ref 3.5–5.1)
Sodium: 134 mmol/L — ABNORMAL LOW (ref 135–145)
Total Bilirubin: 1.7 mg/dL — ABNORMAL HIGH (ref 0.3–1.2)
Total Protein: 8.3 g/dL — ABNORMAL HIGH (ref 6.5–8.1)

## 2022-12-14 LAB — CBC WITH DIFFERENTIAL/PLATELET
Abs Immature Granulocytes: 0.05 10*3/uL (ref 0.00–0.07)
Basophils Absolute: 0.1 10*3/uL (ref 0.0–0.1)
Basophils Relative: 1 %
Eosinophils Absolute: 0.2 10*3/uL (ref 0.0–0.5)
Eosinophils Relative: 2 %
HCT: 38.2 % — ABNORMAL LOW (ref 39.0–52.0)
Hemoglobin: 12.6 g/dL — ABNORMAL LOW (ref 13.0–17.0)
Immature Granulocytes: 1 %
Lymphocytes Relative: 9 %
Lymphs Abs: 1 10*3/uL (ref 0.7–4.0)
MCH: 26.6 pg (ref 26.0–34.0)
MCHC: 33 g/dL (ref 30.0–36.0)
MCV: 80.6 fL (ref 80.0–100.0)
Monocytes Absolute: 0.7 10*3/uL (ref 0.1–1.0)
Monocytes Relative: 6 %
Neutro Abs: 8.6 10*3/uL — ABNORMAL HIGH (ref 1.7–7.7)
Neutrophils Relative %: 81 %
Platelets: 327 10*3/uL (ref 150–400)
RBC: 4.74 MIL/uL (ref 4.22–5.81)
RDW: 14.4 % (ref 11.5–15.5)
WBC: 10.6 10*3/uL — ABNORMAL HIGH (ref 4.0–10.5)
nRBC: 0 % (ref 0.0–0.2)

## 2022-12-14 LAB — TYPE AND SCREEN
ABO/RH(D): O POS
Antibody Screen: NEGATIVE

## 2022-12-14 LAB — RESP PANEL BY RT-PCR (RSV, FLU A&B, COVID)  RVPGX2
Influenza A by PCR: NEGATIVE
Influenza B by PCR: NEGATIVE
Resp Syncytial Virus by PCR: NEGATIVE
SARS Coronavirus 2 by RT PCR: NEGATIVE

## 2022-12-14 LAB — GLUCOSE, CAPILLARY: Glucose-Capillary: 159 mg/dL — ABNORMAL HIGH (ref 70–99)

## 2022-12-14 LAB — TROPONIN I (HIGH SENSITIVITY): Troponin I (High Sensitivity): 4 ng/L (ref <18)

## 2022-12-14 LAB — LACTIC ACID, PLASMA: Lactic Acid, Venous: 1.2 mmol/L (ref 0.5–1.9)

## 2022-12-14 MED ORDER — SODIUM CHLORIDE 0.9 % IV BOLUS
500.0000 mL | Freq: Once | INTRAVENOUS | Status: AC
  Administered 2022-12-14: 500 mL via INTRAVENOUS

## 2022-12-14 MED ORDER — DOXYCYCLINE HYCLATE 100 MG PO CAPS: 100.0000 mg | ORAL_CAPSULE | Freq: Two times a day (BID) | ORAL | 0 refills | Status: DC

## 2022-12-14 MED ORDER — LIDOCAINE-EPINEPHRINE (PF) 2 %-1:200000 IJ SOLN
10.0000 mL | Freq: Once | INTRAMUSCULAR | Status: DC
  Filled 2022-12-14: qty 20

## 2022-12-14 NOTE — Progress Notes (Signed)
[name], [name] ([id number]) [id number] Page 1 of 4 Visit Report for 12/14/2022 Arrival Information Details Patient Name: Date of Service: [place], [name] 12/14/2022 10:00 A M Medical Record Number: [id number] Patient Account Number: [id number] Date of Birth/Sex: Treating RN: [birth year] (67 y.o. [name], [name] Primary Care [name]: [name] Other Clinician: Donavan [name] Referring [name]: Treating [name]/Extender: [name] in Treatment: 76 Visit Information History Since Last Visit All ordered tests and consults were completed: Yes Patient Arrived: Ambulatory Added or deleted any medications: No Arrival Time: 10:14 Any new allergies or adverse reactions: No Accompanied By: self Had a fall or experienced change in No Transfer Assistance: None activities of daily living that may affect Patient Identification Verified: Yes risk of falls: Secondary Verification Process Completed: Yes Signs or symptoms of abuse/neglect since last visito No Patient Requires Transmission-Based Precautions: No Hospitalized since last visit: No Patient Has Alerts: No Implantable device outside of the clinic excluding No cellular tissue based products placed in the center since last visit: Pain Present Now: Yes Electronic Signature(s) Signed: 12/14/2022 2:16:00 PM By: [name] CHT EMT BS , , Entered By: [name] on 12/14/2022 14:15:59 -------------------------------------------------------------------------------- Clinic Level of Care Assessment Details Patient Name: Date of Service: [place], [name] 12/14/2022 10:00 A M Medical Record Number: [id number] Patient Account Number: [id number] Date of Birth/Sex: Treating RN: [birth year] (67 y.o. [name], [name] Primary Care [name]: [name] Other Clinician: Donavan [name] Referring [name]: Treating [name]/Extender: [name] in Treatment: 13 Clinic Level of Care Assessment Items TOOL 4 Quantity Score []  - 0 Use when only an EandM is performed on FOLLOW-UP visit ASSESSMENTS - Nursing Assessment / Reassessment []  - 0 Reassessment of Co-morbidities (includes updates in patient status) []  - 0 Reassessment of Adherence to Treatment Plan ASSESSMENTS - Wound and Skin A ssessment / Reassessment []  - 0 Simple Wound Assessment / Reassessment - one wound []  - 0 Complex Wound Assessment / Reassessment - multiple wounds []  - 0 Dermatologic / Skin Assessment (not related to wound area) ASSESSMENTS - Focused Assessment []  - 0 Circumferential Edema Measurements - multi extremities []  - 0 Nutritional Assessment / Counseling / Intervention [name], [name] ([id number]) [id number] Page 2 of 4 []  - 0 Lower Extremity Assessment (monofilament, tuning fork, pulses) []  - 0 Peripheral Arterial Disease Assessment (using hand held doppler) ASSESSMENTS - Ostomy and/or Continence Assessment and Care []  - 0 Incontinence Assessment and Management []  - 0 Ostomy Care Assessment and Management (repouching, etc.) PROCESS - Coordination of Care []  - 0 Simple Patient / Family Education for ongoing care []  - 0 Complex (extensive) Patient / Family Education for ongoing care []  - 0 Staff obtains Programmer, systems, Records, T Results / Process Orders est []  - 0 Staff telephones HHA, Nursing Homes / Clarify orders / etc []  - 0 Routine Transfer to another Facility (non-emergent condition) []  - 0 Routine Hospital Admission (non-emergent condition) []  - 0 New Admissions / Biomedical engineer / Ordering NPWT Apligraf, etc. , X- 1 20 Emergency Hospital Admission (emergent condition) []  - 0 Simple Discharge Coordination []  - 0 Complex (extensive) Discharge Coordination PROCESS - Special Needs []  - 0 Pediatric / Minor Patient Management []  - 0 Isolation Patient Management []  - 0 Hearing /  Language / Visual special needs []  - 0 Assessment of Community assistance (transportation, D/C planning, etc.) []  - 0 Additional assistance / Altered mentation []  - 0 Support Surface(s) Assessment (bed, cushion, seat, etc.) INTERVENTIONS -  Wound Cleansing / Measurement []  - 0 Simple Wound Cleansing - one wound []  - 0 Complex Wound Cleansing - multiple wounds []  - 0 Wound Imaging (photographs - any number of wounds) []  - 0 Wound Tracing (instead of photographs) []  - 0 Simple Wound Measurement - one wound []  - 0 Complex Wound Measurement - multiple wounds INTERVENTIONS - Wound Dressings []  - 0 Small Wound Dressing one or multiple wounds []  - 0 Medium Wound Dressing one or multiple wounds []  - 0 Large Wound Dressing one or multiple wounds []  - 0 Application of Medications - topical []  - 0 Application of Medications - injection INTERVENTIONS - Miscellaneous []  - 0 External ear exam []  - 0 Specimen Collection (cultures, biopsies, blood, body fluids, etc.) []  - 0 Specimen(s) / Culture(s) sent or taken to Lab for analysis X- 1 10 Patient Transfer (multiple staff / Civil Service fast streamer / Similar devices) []  - 0 Simple Staple / Suture removal (25 or less) []  - 0 Complex Staple / Suture removal (26 or more) []  - 0 Hypo / Hyperglycemic Management (close monitor of Blood Glucose) [name], [name] ([id number]) [id number] Page 3 of 4 []  - 0 Ankle / Brachial Index (ABI) - do not check if billed separately X- 1 5 Vital Signs Has the patient been seen at the hospital within the last three years: Yes Total Score: 35 Level Of Care: New/Established - Level 1 Electronic Signature(s) Unsigned Entered By: [name] on 12/14/2022 14:31:33 -------------------------------------------------------------------------------- General Visit Notes Details Patient Name: Date of Service: [name], [name] 12/14/2022 10:00 A M Medical Record Number: [id number] Patient  Account Number: [id number] Date of Birth/Sex: Treating RN: 1956-09-07 (67 y.o. [name] Primary Care [name]: [name] Other Clinician: Donavan [name] Referring Karman Biswell: Treating Otis Burress/Extender: [name] in Treatment: 13 Notes Patient arrived in preparation for Hyperbaric Oxygen Treatment. Blood glucose level was measured first: 159 mg/dL at 1013. Patient states that he ate yesterday morning. CBG was around 340 mg/dL this morning at home and he took 8 units of insulin at approximately 0545. Based on prior encounters and history today I gave patient 8 oz Glucerna. Patient complained about what was thought to be insect bite from Friday, it appeared quite worse. Other vitals: BP 84/56, HR 83, Respirations: 24, T emp 99.5 oral, Sp02:100. Manual BP taken at 1015: 68/58, Manual BP taken at 1038: 68/54. Patient stated that he about passed out when he got out of his truck. He stated he ate two glucose tablets while sitting in the truck. I asked about whether patient was thirsty, patient responded that he did have "[place]." I asked about his urinary urgency today, he stated that he had only urinated once today. Alerted Dr. Celine Ahr to all the patient's symptoms. She spoke with him and it was recommended to go to the Emergency Department. Patient agreed and I transported him. Patient was checked in at the ED and representative said that "we've got him" and that I could leave. [place] Electronic Signature(s) Signed: 12/14/2022 2:34:24 PM By: [name] CHT EMT BS , , Previous Signature: 12/14/2022 2:29:27 PM Version By: [name] CHT EMT BS , , Entered By: [name] on 12/14/2022 14:34:24 -------------------------------------------------------------------------------- Vitals Details Patient Name: Date of Service: [name], [name] 12/14/2022 10:00 A M Medical Record Number: [id number] Patient Account Number: [id number] Date  of Birth/Sex: Treating RN: 1956/02/03 (67 y.o. [name] Primary Care Genowefa Morga: [name] Other Clinician: Donavan [name] Referring Shandria Clinch: Treating Elijan Googe/Extender:  Kayleen Memos Weeks in Treatment: 13 Vital Signs Time Taken: 10:14 Temperature (F): 99.5 Height (in): 74 Pulse (bpm): 83 Weight (lbs): 245 Respiratory Rate (breaths/min): 24 Body Mass Index (BMI): 31.5 Blood Pressure (mmHg): 84/56 Capillary Blood Glucose (mg/dl): 638 Reference Range: 80 - 120 mg / dl Airway Pulse Oximetry (%): [address] KALEB, SEK [name] ([id number]) [id number] Page 4 of 4 Signed: 12/14/2022 2:16:30 PM By: Haywood Pao CHT EMT BS , , Entered By: Haywood Pao on 12/14/2022 14:16:30

## 2022-12-14 NOTE — ED Triage Notes (Signed)
Pt arrived via wheelchair from wound center. Pt states he was there for normal apt, BP was found to be really low. States that he has been feeling dizzy and weak in his legs.

## 2022-12-14 NOTE — Progress Notes (Signed)
JASYAH, THEURER R ([id number]) [id number] Page 1 of 1 Visit Report for 12/14/2022 SuperBill Details Patient Name: Date of Service: [name], [name] [name] 12/14/2022 Medical Record Number: [id number] Patient Account Number: [id number] Date of Birth/Sex: Treating RN: 1955-12-25 (67 y.o. 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

## 2022-12-14 NOTE — Discharge Instructions (Signed)
Return for any problem.   Dressing on right shoulder may be removed in 48 hours - packing may be removed at same time if surrounding tissue is less inflamed and area appears to be healing.   Take Doxycycline as prescribed.

## 2022-12-14 NOTE — ED Provider Notes (Signed)
[facility]-EMERGENCY DEPT Provider Note   CSN: [id number] Arrival date & time: 12/14/22  1048     History  Chief Complaint  Patient presents with   Hypotension    [name] is a 67 y.o. male.  67 year old male with prior medical history as detailed below presents for evaluation.  Patient presented from the wound care center.  Patient reports that he was presenting for appointment for hyperbaric treatment of his left foot wounds that are chronic.  Patient was noted to be hypotensive and sent to the ED for evaluation.  Patient reports approximately 24 hours of feeling weak and fatigued.  He did not realize that he was hypotensive.  Denies fevers.  He denies nausea or vomiting.  He denies chest pain, abdominal pain, or other acute painful condition.  He reports chronic ulcerations on the left foot and left heel.  He has been told by multiple providers in the past that his left lower leg may need to be amputated secondary to these nonhealing ulcerations.  He is currently receiving hyperbaric oxygen therapy for treatment of the left foot ulcerations.  He reports that he is on an antibiotic although he cannot recall exactly what antibiotic he is on.  He also complains of small area of redness and swelling to the lateral aspect of his right shoulder.  This is been there for several days.  He is concerned that it is infected sebaceous cyst.  The history is provided by the patient and medical records.       Home Medications Prior to Admission medications   Medication Sig Start Date End Date Taking? Authorizing Provider  aspirin EC 81 MG tablet Take 81 mg by mouth daily.     [provider]  atorvastatin (LIPITOR) 10 MG tablet Take 10 mg by mouth daily.     [provider]  B-D UF III MINI PEN NEEDLES 31G X 5 MM MISC USE AS DIRECTED UP TO 3 TIMES DAILY 02/28/19   [provider]  clotrimazole-betamethasone (LOTRISONE) cream Apply  fingertip amount to bottom of both feet daily. 05/29/18   Park Liter, DPM  Continuous Blood Gluc Sensor (FREESTYLE LIBRE 14 DAY SENSOR) MISC Use to check blood glucose 01/22/19   [provider]  EPINEPHrine (EPIPEN 2-PAK) 0.3 mg/0.3 mL IJ SOAJ injection Inject 0.3 mg into the muscle as needed for anaphylaxis.     [provider]  furosemide (LASIX) 40 MG tablet Take 40 mg by mouth daily.     [provider]  insulin aspart protamine - aspart (NOVOLOG MIX 70/30 FLEXPEN) (70-30) 100 UNIT/ML FlexPen Inject 50 Units into the skin 2 (two) times daily with a meal.     [provider]  losartan (COZAAR) 50 MG tablet Take 50 mg by mouth daily.     [provider]  NOVOLOG FLEXPEN 100 UNIT/ML FlexPen Inject 10 Units into the skin 3 (three) times daily with meals.  02/15/19   [provider]  potassium chloride (K-DUR) 10 MEQ tablet Take 20 mEq by mouth daily.  04/12/18   [provider]  TRESIBA FLEXTOUCH 200 UNIT/ML SOPN Inject 80 Units into the skin daily. 09/04/19   [provider]      Allergies    Patient has no known allergies.    Review of Systems   Review of Systems  All other systems reviewed and are negative.   Physical Exam Updated Vital Signs BP (!) 92/51   Pulse 85  Temp 98.2 F (36.8 C) (Oral)   Resp 15   SpO2 100%  Physical Exam Vitals and nursing note reviewed.  Constitutional:      General: He is not in acute distress.    Appearance: Normal appearance. He is well-developed.  HENT:     Head: Normocephalic and atraumatic.  Eyes:     Conjunctiva/sclera: Conjunctivae normal.     Pupils: Pupils are equal, round, and reactive to light.  Cardiovascular:     Rate and Rhythm: Normal rate and regular rhythm.     Heart sounds: Normal heart sounds.  Pulmonary:     Effort: Pulmonary effort is normal. No respiratory distress.     Breath sounds: Normal breath sounds.  Abdominal:     General: There is no  distension.     Palpations: Abdomen is soft.     Tenderness: There is no abdominal tenderness.  Musculoskeletal:        General: No deformity. Normal range of motion.     Cervical back: Normal range of motion and neck supple.  Skin:    General: Skin is warm and dry.     Comments: Chronic ulcerations to the dorsal aspect the left foot and to the left heel.  See images below.  4x4 cm area of mild edema and mild erythema to right upper lateral arm - possible infected sebaceous cyst.    Neurological:     General: No focal deficit present.     Mental Status: He is alert and oriented to person, place, and time.        ED Results / Procedures / Treatments   Labs (all labs ordered are listed, but only abnormal results are displayed) Labs Reviewed  CBC WITH DIFFERENTIAL/PLATELET - Abnormal; Notable for the following components:      Result Value   WBC 10.6 (*)    Hemoglobin 12.6 (*)    HCT 38.2 (*)    Neutro Abs 8.6 (*)    All other components within normal limits  CULTURE, BLOOD (ROUTINE X 2)  CULTURE, BLOOD (ROUTINE X 2)  RESP PANEL BY RT-PCR (RSV, FLU A&B, COVID)  RVPGX2  URINE CULTURE  COMPREHENSIVE METABOLIC PANEL  URINALYSIS, ROUTINE W REFLEX MICROSCOPIC  LACTIC ACID, PLASMA  LACTIC ACID, PLASMA  TYPE AND SCREEN  TROPONIN I (HIGH SENSITIVITY)    EKG EKG Interpretation  Date/Time:  Tuesday December 14 2022 11:21:34 EST Ventricular Rate:  86 PR Interval:  160 QRS Duration: 103 QT Interval:  362 QTC Calculation: 433 R Axis:   -34 Text Interpretation: Sinus rhythm Abnormal R-wave progression, early transition Left ventricular hypertrophy Confirmed by Dene Gentry [id number]) on 12/14/2022 11:24:09 AM  Radiology No results found.  Procedures .Marland KitchenIncision and Drainage  Date/Time: 12/14/2022 2:24 PM  Performed by: Valarie Merino, MD Authorized by: Valarie Merino, MD   Consent:    Consent obtained:  Verbal   Consent given by:  Patient   Risks, benefits, and  alternatives were discussed: yes     Risks discussed:  Bleeding, damage to other organs, incomplete drainage, infection and pain   Alternatives discussed:  No treatment Universal protocol:    Immediately prior to procedure, a time out was called: yes     Patient identity confirmed:  Verbally with patient and arm band Location:    Type:  Abscess   Size:  3   Location:  Upper extremity   Upper extremity location:  Arm   Arm location:  R upper arm Pre-procedure details:  Skin preparation:  Povidone-iodine Sedation:    Sedation type:  None Anesthesia:    Anesthesia method:  Local infiltration   Local anesthetic:  Lidocaine 2% WITH epi Procedure type:    Complexity:  Simple Procedure details:    Needle aspiration: yes     Needle size:  18 G   Incision types:  Stab incision   Incision depth:  Dermal   Wound management:  Probed and deloculated   Drainage:  Purulent   Drainage amount:  Scant   Wound treatment:  Drain placed   Packing materials:  1/4 in iodoform gauze   Amount 1/4" iodoform:  1 Post-procedure details:    Procedure completion:  Tolerated     Medications Ordered in ED Medications  sodium chloride 0.9 % bolus 500 mL (has no administration in time range)    ED Course/ Medical Decision Making/ A&P                           Medical Decision Making Amount and/or Complexity of Data Reviewed Labs: ordered. Radiology: ordered.  Risk Prescription drug management.    Medical Screen Complete  This patient presented to the ED with complaint of hypotension.  This complaint involves an extensive number of treatment options. The initial differential diagnosis includes, but is not limited to, AKI, dehydration, metabolic abnormality, infection,etc  This presentation is: Acute, Chronic, Self-Limited, Previously Undiagnosed, Uncertain Prognosis, Complicated, Systemic Symptoms, and Threat to Life/Bodily Function  Patient is presenting with complaint of mild weakness  and dizziness.  Patient presents from wound clinic where he was noted to be mildly hypotensive on arrival there for treatment.  Patient is without other significant complaint.  Patient does report likely small abscess to the lateral aspect of his right shoulder.  Is been present for several days.  He denies fever.  He reports that his chronic wounds to the left foot are actually improving.  He does not feel that there is significant drainage or discharge from these chronic wounds.  He reports that he is on a antibiotic to cover possible chronic osteomyelitis.  He cannot recall the name of this antibiotic.  Small superficial abscess on his right posterior lateral shoulder drained without difficulty.  Screening labs obtained.  Labs are remarkable for mildly elevated BUN and creatinine.  Patient's white count is 10.6.  Lactic acid is 1.2.  Patient is feeling significantly improved after IV fluid bolus here in the ED. BP is improved.  Patient is adamant about the need to return home.  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  Patient is advised that the most conservative and safest course of action would be admission, IV antibiotics, and awaiting blood culture results.  However, patient has capacity to refuse care.  He does NOT want to be admitted.  He is aware that he may receive a phone call regarding positive cultures and will need to return to the hospital for treatment/admission.  Patient is agreeable with plan to add doxycycline to his current regimen.  This is primarily to treat skin infection/abscess/infected cyst on right shoulder.  Importance of close follow-up is stressed.  Strict return precautions given and understood.  Additional history obtained: External records from outside sources obtained and reviewed including prior ED visits and prior Inpatient records.    Lab Tests:  I ordered and personally interpreted labs.  The pertinent results include: CBC, CMP, troponin,  lactic acid, blood cultures x 2, COVID, flu, RSV  Imaging Studies ordered:  I ordered imaging studies including chest x-ray I independently visualized and interpreted obtained imaging which showed NAD I agree with the radiologist interpretation.   Cardiac Monitoring:  The patient was maintained on a cardiac monitor.  I personally viewed and interpreted the cardiac monitor which showed an underlying rhythm of: NSR   Medicines ordered:  I ordered medication including IV fluids for suspected dehydration Reevaluation of the patient after these medicines showed that the patient: improved  Problem List / ED Course:  Transient hypotension, abscess to the lateral aspect of right shoulder, dehydration   Reevaluation:  After the interventions noted above, I reevaluated the patient and found that they have: improved  Disposition:  After consideration of the diagnostic results and the patients response to treatment, I feel that the patent would benefit from close outpatient followup since patient is refusing admission.            Final Clinical Impression(s) / ED Diagnoses Final diagnoses:  Weakness  Dehydration  Abscess    Rx / DC Orders ED Discharge Orders          Ordered    doxycycline (VIBRAMYCIN) 100 MG capsule  2 times daily        12/14/22 1437              Wynetta Fines, MD 12/14/22 1616

## 2022-12-15 ENCOUNTER — Encounter (HOSPITAL_BASED_OUTPATIENT_CLINIC_OR_DEPARTMENT_OTHER): Payer: Medicare Other | Admitting: General Surgery

## 2022-12-15 DIAGNOSIS — E11621 Type 2 diabetes mellitus with foot ulcer: Secondary | ICD-10-CM | POA: Diagnosis not present

## 2022-12-15 LAB — GLUCOSE, CAPILLARY
Glucose-Capillary: 243 mg/dL — ABNORMAL HIGH (ref 70–99)
Glucose-Capillary: 268 mg/dL — ABNORMAL HIGH (ref 70–99)
Glucose-Capillary: 195 mg/dL — ABNORMAL HIGH (ref 70–99)

## 2022-12-15 NOTE — Progress Notes (Signed)
[name], [name] ([id number]) [id number] Page 1 of 2 Visit Report for 12/15/2022 HBO Details Patient Name: Date of Service: [place], [name] 12/15/2022 1:00 PM Medical Record Number: [id number] Patient Account Number: [id number] Date of Birth/Sex: Treating RN: 09-07-1956 (67 y.o. 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 HBO Treatment Details Treatment Number: 27 Patient Type: Outpatient Chamber Type: Monoplace Chamber Serial #: [id number] Treatment Protocol: 2.0 ATA with 90 minutes oxygen, with two 5 minute air breaks Treatment Details Compression Rate Down: 1.5 psi / minute De-Compression Rate Up: A breaks and breathing ir Compress Tx Pressure periods Decompress Decompress Begins Reached (leave unused spaces Begins Ends blank) Chamber Pressure (ATA 1 2 2 2 2 2  --2 1 ) Clock Time (24 hr) 13:34 13:47 14:18 14:23 14:53 14:58 - - 15:28 15:41 Treatment Length: 127 (minutes) Treatment Segments: 4 Vital Signs Capillary Blood Glucose Reference Range: 80 - 120 mg / dl HBO Diabetic Blood Glucose Intervention Range: <131 mg/dl or >249 mg/dl Time Vitals Blood Respiratory Capillary Blood Glucose Pulse Action Type: Pulse: Temperature: Taken: Pressure: Rate: Glucose (mg/dl): Meter #: Oximetry (%) Taken: Pre 13:05 268 Post 15:49 127/61 60 18 98.1 195 Treatment Response Treatment Toleration: Well Treatment Completion Status: Treatment Completed without Adverse Event Physician HBO Attestation: I certify that I supervised this HBO treatment in accordance with Medicare guidelines. A trained  emergency response team is readily available per Yes hospital policies and procedures. Continue HBOT as ordered. Yes Electronic Signature(s) Signed: 12/16/2022 7:50:26 AM By: Fredirick Maudlin MD FACS Previous Signature: 12/15/2022 4:37:26 PM Version By: Valeria Batman EMT Entered By: Fredirick Maudlin on 12/16/2022 07:50:26 HBO Safety Checklist Details -------------------------------------------------------------------------------- [name] ([id number]) [id number] Page 2 of 2 Patient Name: Date of Service: [place], [name] 12/15/2022 1:00 PM Medical Record Number: [id number] Patient Account Number: [id number] Date of Birth/Sex: Treating RN: August 20, 1956 (67 y.o. [name] Primary Care Kaytelynn Scripter: Nelda Bucks Other Clinician: Valeria Batman Referring Moishe Schellenberg: Treating Jaron Czarnecki/Extender: Doristine Bosworth in Treatment: 13 HBO Safety Checklist Items Safety Checklist Consent Form Signed Patient voided / foley secured and emptied When did you last eato last night Last dose of injectable or oral agent 0835 Ostomy pouch emptied and vented if applicable NA All implantable devices assessed, documented and approved NA Intravenous access site secured and place NA Valuables secured Linens and cotton and cotton/polyester blend (less than 51% polyester) Personal oil-based products / skin lotions / body lotions removed Wigs or hairpieces removed NA Smoking or tobacco materials removed Books / newspapers / magazines / loose paper removed Cologne, aftershave, perfume and deodorant removed Jewelry removed (may wrap wedding band) Make-up removed NA Hair care products removed Battery operated devices (external) removed Heating patches and chemical warmers removed Titanium eyewear removed NA Nail polish cured greater than 10 hours NA Casting material cured greater than 10 hours NA Hearing aids removed NA Loose dentures or  partials removed removed by patient Prosthetics have been removed NA Patient demonstrates correct use of air break device (if applicable) Patient concerns have been addressed Patient grounding bracelet on and cord attached to chamber Specifics for Inpatients (complete in addition to above) Medication sheet sent with patient NA Intravenous medications needed or due during therapy sent  with patient NA Drainage tubes (e.g. nasogastric tube or chest tube secured and vented) NA Endotracheal or Tracheotomy tube secured NA Cuff deflated of air and inflated with saline NA Airway suctioned NA Notes The safety checklist was sone before the treatment was started. The patient stated that his blood sugar was 342 at 0830 this morning. Patient used Afrin before treatment today. Electronic Signature(s) Signed: 12/15/2022 3:10:09 PM By: Valeria Batman EMT Entered By: Valeria Batman on 12/15/2022 15:10:09

## 2022-12-15 NOTE — Progress Notes (Signed)
[name], [name] ([id number]) [id number] Page 1 of 6 Visit Report for 12/15/2022 Arrival Information Details Patient Name: Date of Service: [name]. 12/15/2022 3:50 PM Medical Record Number: [id number] Patient Account Number: [id number] Date of Birth/Sex: Treating RN: [birth year] (67 y.o. [name] Primary Care Briawna Carver: Nelda Bucks Other Clinician: Referring Kyllian Clingerman: Treating Delphia Kaylor/Extender: Doristine Bosworth in Treatment: 12 Visit Information History Since Last Visit Added or deleted any medications: No Patient Arrived: Knee Scooter Any new allergies or adverse reactions: No Arrival Time: 16:11 Had a fall or experienced change in No Accompanied By: Self activities of daily living that may affect Transfer Assistance: Manual risk of falls: Patient Requires Transmission-Based Precautions: No Signs or symptoms of abuse/neglect since last visito No Patient Has Alerts: No Hospitalized since last visit: No Implantable device outside of the clinic excluding No cellular tissue based products placed in the center since last visit: Has Dressing in Place as Prescribed: Yes Pain Present Now: No Electronic Signature(s) Signed: 12/15/2022 4:11:58 PM By: Blanche East RN Entered By: Blanche East on 12/15/2022 16:11:58 -------------------------------------------------------------------------------- Clinic Level of Care Assessment Details Patient Name: Date of Service: [name], [name]. 12/15/2022 3:50 PM Medical Record Number: [id number] Patient Account Number: [id number] Date of Birth/Sex: Treating RN: 1956-07-25 (67 y.o. [name] Primary Care Bob Daversa: Nelda Bucks Other Clinician: Referring Uzoma Vivona: Treating Otha Monical/Extender: Doristine Bosworth in Treatment: 13 Clinic Level of Care Assessment Items TOOL 1 Quantity Score X- 1 0 Use when EandM and Procedure is performed on INITIAL  visit ASSESSMENTS - Nursing Assessment / Reassessment X- 1 20 General Physical Exam (combine w/ comprehensive assessment (listed just below) when performed on new pt. evals) X- 1 25 Comprehensive Assessment (HX, ROS, Risk Assessments, Wounds Hx, etc.) ASSESSMENTS - Wound and Skin Assessment / Reassessment []  - 0 Dermatologic / Skin Assessment (not related to wound area) ASSESSMENTS - Ostomy and/or Continence Assessment and Care []  - 0 Incontinence Assessment and Management []  - 0 Ostomy Care Assessment and Management (repouching, etc.) PROCESS - Coordination of Care X - Simple Patient / Family Education for ongoing care 1 15 []  - 0 Complex (extensive) Patient / Family Education for ongoing care [name], [name] ([id number]) [id number].pdf Page 2 of 6 X- 1 10 Staff obtains Consents, Records, T Results / Process Orders est X- 1 10 Staff telephones HHA, Nursing Homes / Clarify orders / etc []  - 0 Routine Transfer to another Facility (non-emergent condition) []  - 0 Routine Hospital Admission (non-emergent condition) []  - 0 New Admissions / Biomedical engineer / Ordering NPWT Apligraf, etc. , []  - 0 Emergency Hospital Admission (emergent condition) PROCESS - Special Needs []  - 0 Pediatric / Minor Patient Management []  - 0 Isolation Patient Management []  - 0 Hearing / Language / Visual special needs []  - 0 Assessment of Community assistance (transportation, D/C planning, etc.) []  - 0 Additional assistance / Altered mentation []  - 0 Support Surface(s) Assessment (bed, cushion, seat, etc.) INTERVENTIONS - Miscellaneous []  - 0 External ear exam []  - 0 Patient Transfer (multiple staff / Civil Service fast streamer / Similar devices) []  - 0 Simple Staple / Suture removal (25 or less) []  - 0 Complex Staple / Suture removal (26 or more) []  - 0 Hypo/Hyperglycemic Management (do not check if billed separately) []  - 0 Ankle / Brachial Index (ABI) - do not check if  billed separately Has the patient been seen at the hospital within the last three years: Yes Total Score: 80  Level Of Care: New/Established - Level 3 Electronic Signature(s) Unsigned Entered By: Blanche East on 12/15/2022 16:13:12 -------------------------------------------------------------------------------- Encounter Discharge Information Details Patient Name: Date of Service: [name], [name]. 12/15/2022 3:50 PM Medical Record Number: [id number] Patient Account Number: [id number] Date of Birth/Sex: Treating RN: 14-Oct-1956 (67 y.o. [name] Primary Care Bexton Haak: Nelda Bucks Other Clinician: Referring Glendell Schlottman: Treating Rahkeem Senft/Extender: Doristine Bosworth in Treatment: 13 Encounter Discharge Information Items Discharge Condition: Stable Ambulatory Status: Knee Scooter Discharge Destination: Home Transportation: Private Auto Accompanied By: self Schedule Follow-up Appointment: Yes Clinical Summary of Care: Electronic Signature(s) Signed: 12/15/2022 4:14:00 PM By: Blanche East RN Entered By: Blanche East on 12/15/2022 16:14:00 [name] ([id number]) [id number] Page 3 of 6 -------------------------------------------------------------------------------- Patient/Caregiver Education Details Patient Name: Date of Service: [name], [name] 1/3/2024andnbsp3:50 PM Medical Record Number: [id number] Patient Account Number: [id number] Date of Birth/Gender: Treating RN: 09/18/1956 (67 y.o. [name] Primary Care Physician: Nelda Bucks Other Clinician: Referring Physician: Treating Physician/Extender: Doristine Bosworth in Treatment: 13 Education Assessment Education Provided To: Patient Education Topics Provided Infection: Methods: Explain/Verbal Responses: Reinforcements needed, State content correctly Wound/Skin Impairment: Methods: Explain/Verbal Responses: Reinforcements  needed, State content correctly Electronic Signature(s) Unsigned Entered By: Blanche East on 12/15/2022 16:13:48 -------------------------------------------------------------------------------- Wound Assessment Details Patient Name: Date of Service: [name], [name]. 12/15/2022 3:50 PM Medical Record Number: [id number] Patient Account Number: [id number] Date of Birth/Sex: Treating RN: May 24, 1956 (67 y.o. [name] Primary Care Wilkin Lippy: Nelda Bucks Other Clinician: Referring Ad Guttman: Treating Katoya Amato/Extender: Doristine Bosworth in Treatment: 13 Wound Status Wound Number: 1 Primary Etiology: Diabetic Wound/Ulcer of the Lower Extremity Wound Location: Left Calcaneus Wound Status: Open Wounding Event: Pressure Injury Date Acquired: 03/09/2019 Weeks Of Treatment: 13 Clustered Wound: No Wound Measurements Length: (cm) 5.3 Width: (cm) 4.5 Depth: (cm) 0.5 Area: (cm) 18.732 Volume: (cm) 9.366 % Reduction in Area: 13.3% % Reduction in Volume: 56.6% Wound Description Classification: Grade 3 Exudate Amount: Medium WENDLE, KINA [name] ([id number]) Exudate Type: Serosanguineous Exudate Color: red, brown [id number] Page 4 of 6 Periwound Skin Texture Texture Color No Abnormalities Noted: No No Abnormalities Noted: No Moisture No Abnormalities Noted: No Treatment Notes Wound #1 (Calcaneus) Wound Laterality: Left Cleanser Normal Saline Discharge Instruction: Cleanse the wound with Normal Saline prior to applying a clean dressing using gauze sponges, not tissue or cotton balls. Soap and Water Discharge Instruction: May shower and wash wound with dial antibacterial soap and water prior to dressing change. Peri-Wound Care keystone Topical Primary Dressing Sorbalgon AG Dressing, 4x4 (in/in) Discharge Instruction: Apply to wound bed as instructed Secondary Dressing Woven Gauze Sponge, Non-Sterile 4x4 in Discharge  Instruction: Apply over primary dressing as directed. Zetuvit Plus 4x8 in Discharge Instruction: Apply over primary dressing as directed. Secured With Elastic Bandage 4 inch (ACE bandage) Discharge Instruction: Secure with ACE bandage as directed. Kerlix Roll Sterile, 4.5x3.1 (in/yd) Discharge Instruction: Secure with Kerlix as directed. Compression Wrap Compression Stockings Add-Ons Electronic Signature(s) Unsigned Entered By: Blanche East on 12/15/2022 16:12:40 -------------------------------------------------------------------------------- Wound Assessment Details Patient Name: Date of Service: [name], [name]. 12/15/2022 3:50 PM Medical Record Number: [id number] Patient Account Number: [id number] Date of Birth/Sex: Treating RN: Dec 29, 1955 (67 y.o. [name] Primary Care Shadd Dunstan: Nelda Bucks Other Clinician: Referring Tiwatope Emmitt: Treating Sophiana Milanese/Extender: Doristine Bosworth in Treatment: 13 Wound Status Wound Number: 2 Primary Etiology: Diabetic Wound/Ulcer of the Lower Extremity Wound Location: Left, Dorsal Foot Wound Status: Open Wounding Event:  Pressure Injury Date Acquired: 07/09/2022 [name], [name] ([id number]) [id number].pdf Page 5 of 6 Weeks Of Treatment: 13 Clustered Wound: No Wound Measurements Length: (cm) 5 Width: (cm) 2.5 Depth: (cm) 0.1 Area: (cm) 9.817 Volume: (cm) 0.982 % Reduction in Area: -4.2% % Reduction in Volume: -4.2% Wound Description Classification: Grade 2 Exudate Amount: Medium Exudate Type: Serosanguineous Exudate Color: red, brown Periwound Skin Texture Texture Color No Abnormalities Noted: No No Abnormalities Noted: No Moisture No Abnormalities Noted: No Treatment Notes Wound #2 (Foot) Wound Laterality: Dorsal, Left Cleanser Normal Saline Discharge Instruction: Cleanse the wound with Normal Saline prior to applying a clean dressing using gauze sponges, not tissue or  cotton balls. Soap and Water Discharge Instruction: May shower and wash wound with dial antibacterial soap and water prior to dressing change. Peri-Wound Care keystone Topical Primary Dressing Sorbalgon AG Dressing, 4x4 (in/in) Discharge Instruction: Apply to wound bed as instructed Secondary Dressing Woven Gauze Sponge, Non-Sterile 4x4 in Discharge Instruction: Apply over primary dressing as directed. Zetuvit Plus 4x8 in Discharge Instruction: Apply over primary dressing as directed. Secured With Elastic Bandage 4 inch (ACE bandage) Discharge Instruction: Secure with ACE bandage as directed. Kerlix Roll Sterile, 4.5x3.1 (in/yd) Discharge Instruction: Secure with Kerlix as directed. Compression Wrap Compression Stockings Add-Ons Electronic Signature(s) Unsigned Entered By: Tommie Ard on 12/15/2022 16:12:40 Signature(s): RANULFO, KALL [name] ([id number]) [id number] Date(s): [id number] Page 6 of 6 -------------------------------------------------------------------------------- Vitals Details Patient Name: Date of Service: [name], [name] 12/15/2022 3:50 PM Medical Record Number: [id number] Patient Account Number: [id number] Date of Birth/Sex: Treating RN: 06/12/1956 (67 y.o. [name] Primary Care Kitti Mcclish: Foye [name] Other Clinician: Referring Lakysha Kossman: Treating Izabellah Dadisman/Extender: Kandis Cocking in Treatment: 13 Vital Signs Time Taken: 15:49 Temperature (F): 98.1 Height (in): 74 Pulse (bpm): 60 Weight (lbs): 245 Respiratory Rate (breaths/min): 18 Body Mass Index (BMI): 31.5 Blood Pressure (mmHg): 127/61 Capillary Blood Glucose (mg/dl): 892 Reference Range: 80 - 120 mg / dl Electronic Signature(s) Signed: 12/15/2022 4:12:27 PM By: Tommie Ard RN Entered By: Tommie Ard on 12/15/2022 16:12:27

## 2022-12-15 NOTE — Progress Notes (Signed)
CHELSEA, NUSZ R ([id number]) [id number] Page 1 of 1 Visit Report for 12/15/2022 SuperBill Details Patient Name: Date of Service: [name], [name] [name] 12/15/2022 Medical Record Number: [id number] Patient Account Number: [id number] Date of Birth/Sex: Treating RN: [birth year] (67 y.o. 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.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, left ankle and foot E11.65 Type 2 diabetes mellitus with hyperglycemia E11.621 Type 2 diabetes mellitus with foot ulcer Facility Procedures The patient participates with Medicare or their insurance follows the Medicare Facility Guidelines CPT4 Code Description Modifier Quantity [id number] [phone] - WOUND CARE VISIT-LEV 3 EST PT 25 1 Electronic Signature(s) Signed: 12/15/2022 4:14:34 PM By: Blanche East RN Signed: 12/15/2022 4:24:49 PM By: Fredirick Maudlin MD FACS Entered By: Blanche East on 12/15/2022 16:14:33

## 2022-12-15 NOTE — Progress Notes (Signed)
RIDDICK, NUON R ([id number]) [id number] Page 1 of 2 Visit Report for 12/15/2022 Arrival Information Details Patient Name: Date of Service: [name], [name] LD R. 12/15/2022 1:00 PM Medical Record Number: [id number] Patient Account Number: [id number] Date of Birth/Sex: Treating RN: 1956/05/02 (67 y.o. [name] Primary Care Aylan Bayona: Nelda Bucks Other Clinician: Referring Marianela Mandrell: Treating Jacelynn Hayton/Extender: Doristine Bosworth in Treatment: 30 Visit Information History Since Last Visit All ordered tests and consults were completed: Yes Patient Arrived: Knee Scooter Added or deleted any medications: Yes Arrival Time: 12:52 Pain Present Now: No Accompanied By: None Transfer Assistance: None Patient Identification Verified: Yes Secondary Verification Process Completed: Yes Patient Requires Transmission-Based Precautions: No Patient Has Alerts: No Notes The patient was seen in the Emergency Department yesterday. Electronic Signature(s) Signed: 12/15/2022 3:04:38 PM By: Valeria Batman EMT Entered By: Valeria Batman on 12/15/2022 15:04:37 -------------------------------------------------------------------------------- Encounter Discharge Information Details Patient Name: Date of Service: [name], [name] NA LD R. 12/15/2022 1:00 PM Medical Record Number: [id number] Patient Account Number: [id number] Date of Birth/Sex: Treating RN: 17-Dec-1955 (66 y.o. [name] Primary Care Javanna Patin: Nelda Bucks Other Clinician: Valeria Batman Referring Meeyah Ovitt: Treating Alawna Graybeal/Extender: Doristine Bosworth in Treatment: 13 Encounter Discharge Information Items Discharge Condition: Stable Ambulatory Status: Knee Scooter Discharge Destination: Home Transportation: Private Auto Accompanied By: None Schedule Follow-up Appointment: Yes Clinical Summary of Care: Electronic Signature(s) Signed: 12/15/2022 4:38:28 PM By:  Valeria Batman EMT Entered By: Valeria Batman on 12/15/2022 16:38:28 [name] ([id number]) [id number] Page 2 of 2 -------------------------------------------------------------------------------- Vitals Details Patient Name: Date of Service: [name], [name] LD R. 12/15/2022 1:00 PM Medical Record Number: [id number] Patient Account Number: [id number] Date of Birth/Sex: Treating RN: April 04, 1956 (67 y.o. [name] Primary Care Preet Mangano: Nelda Bucks Other Clinician: Valeria Batman Referring Marynell Bies: Treating Zuriel Yeaman/Extender: Doristine Bosworth in Treatment: 13 Vital Signs Time Taken: 13:05 Capillary Blood Glucose (mg/dl): 268 Height (in): 74 Reference Range: 80 - 120 mg / dl Weight (lbs): 245 Body Mass Index (BMI): 31.5 Electronic Signature(s) Signed: 12/15/2022 3:06:40 PM By: Valeria Batman EMT Entered By: Valeria Batman on 12/15/2022 15:06:39

## 2022-12-16 ENCOUNTER — Encounter (HOSPITAL_BASED_OUTPATIENT_CLINIC_OR_DEPARTMENT_OTHER): Payer: Medicare Other | Admitting: General Surgery

## 2022-12-16 NOTE — Progress Notes (Signed)
ZAYON, TRULSON R ([id number]) [id number] Page 1 of 2 Visit Report for 12/15/2022 Problem List Details Patient Name: Date of Service: [place], [name] 12/15/2022 1:00 PM Medical Record Number: [id number] Patient Account Number: [id number] Date of Birth/Sex: Treating RN: 30-May-1956 (67 y.o. [name] Primary Care Provider: Nelda Bucks Other Clinician: Valeria Batman Referring Provider: Treating Provider/Extender: Doristine Bosworth in Treatment: 13 Active Problems ICD-10 Encounter Code Description Active Date MDM Diagnosis [phone] Non-pressure chronic ulcer of left heel and midfoot with 09/10/2022 No Yes necrosis of bone L97.523 Non-pressure chronic ulcer of other part of left foot with 09/10/2022 No Yes necrosis of 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, left ankle and foot 09/10/2022 No Yes E11.65 Type 2 diabetes mellitus with hyperglycemia 09/10/2022 No Yes E11.621 Type 2 diabetes mellitus with foot ulcer 09/10/2022 No Yes Inactive Problems Resolved Problems Electronic Signature(s) Signed: 12/15/2022 4:37:59 PM By: Valeria Batman EMT Signed: 12/16/2022 7:50:02 AM By: Fredirick Maudlin MD FACS Entered By: Valeria Batman on 12/15/2022 16:37:59 -------------------------------------------------------------------------------- SuperBill Details Patient Name: Date of Service: [place], [name] 12/15/2022 Medical Record Number: [id number] Patient Account Number: [id number] Date of Birth/Sex: Treating RN: September 28, 1956 (67 y.o. [name] Primary Care Provider: Nelda Bucks Other Clinician: Valeria Batman Referring Provider: Treating Provider/Extender: Doristine Bosworth in Treatment: [address] R ([id number]) [id number] Page 2 of 2 ICD-10 Codes Code Description [phone] Non-pressure chronic ulcer of left heel and midfoot with necrosis of  bone L97.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, left ankle and foot E11.65 Type 2 diabetes mellitus with hyperglycemia E11.621 Type 2 diabetes mellitus with foot ulcer Facility Procedures : The patient participates with Medicare or their insurance follows the Medicare Facility Guidelines: CPT4 Code Description Modifier Quantity [id number] G0277-(Facility Use Only) HBOT full body chamber, 70min , 4 ICD-10 Diagnosis Description [phone] Other  chronic osteomyelitis, 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 2 diabetes mellitus with foot  ulcer Physician Procedures : CPT4 Code Description Modifier [id number] - WC PHYS HYPERBARIC OXYGEN THERAPY ICD-10 Diagnosis Description 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, left ankle and foot L97.424 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

## 2022-12-17 ENCOUNTER — Encounter (HOSPITAL_BASED_OUTPATIENT_CLINIC_OR_DEPARTMENT_OTHER): Payer: Medicare Other | Admitting: General Surgery

## 2022-12-17 DIAGNOSIS — E11621 Type 2 diabetes mellitus with foot ulcer: Secondary | ICD-10-CM | POA: Diagnosis not present

## 2022-12-17 LAB — GLUCOSE, CAPILLARY
Glucose-Capillary: 267 mg/dL — ABNORMAL HIGH (ref 70–99)
Glucose-Capillary: 245 mg/dL — ABNORMAL HIGH (ref 70–99)

## 2022-12-17 NOTE — Progress Notes (Signed)
ANTHON, HARPOLE R ([id number]) [id number] Page 1 of 2 Visit Report for 12/17/2022 Arrival Information Details Patient Name: Date of Service: [place], [name] 12/17/2022 10:00 A M Medical Record Number: [id number] Patient Account Number: [id number] Date of Birth/Sex: Treating RN: [birth year] (67 y.o. [name] Primary Care Brogan Martis: Nelda Bucks Other Clinician: Valeria Batman Referring Corinthia Helmers: Treating Kaleb Sek/Extender: Doristine Bosworth in Treatment: 34 Visit Information History Since Last Visit All ordered tests and consults were completed: Yes Patient Arrived: Knee Scooter Added or deleted any medications: No Arrival Time: 10:10 Any new allergies or adverse reactions: No Accompanied By: None Had a fall or experienced change in No Transfer Assistance: None activities of daily living that may affect Patient Identification Verified: Yes risk of falls: Secondary Verification Process Completed: Yes Signs or symptoms of abuse/neglect since last visito No Patient Requires Transmission-Based Precautions: No Hospitalized since last visit: No Patient Has Alerts: No Implantable device outside of the clinic excluding No cellular tissue based products placed in the center since last visit: Pain Present Now: No Electronic Signature(s) Signed: 12/17/2022 2:18:05 PM By: Valeria Batman EMT Entered By: Valeria Batman on 12/17/2022 14:18:05 -------------------------------------------------------------------------------- Encounter Discharge Information Details Patient Name: Date of Service: [name], [name] 12/17/2022 10:00 A M Medical Record Number: [id number] Patient Account Number: [id number] Date of Birth/Sex: Treating RN: [birth year] (67 y.o. [name] Primary Care Vue Pavon: Nelda Bucks Other Clinician: Valeria Batman Referring Dai Mcadams: Treating Miyu Fenderson/Extender: Doristine Bosworth in  Treatment: 14 Encounter Discharge Information Items Discharge Condition: Stable Ambulatory Status: Knee Scooter Discharge Destination: Home Transportation: Private Auto Accompanied By: None Schedule Follow-up Appointment: Yes Clinical Summary of Care: Electronic Signature(s) Signed: 12/17/2022 2:23:48 PM By: Valeria Batman EMT Entered By: Valeria Batman on 12/17/2022 14:23:48 [name] ([id number]) [id number] Page 2 of 2 -------------------------------------------------------------------------------- Vitals Details Patient Name: Date of Service: [place], [name] 12/17/2022 10:00 A M Medical Record Number: [id number] Patient Account Number: [id number] Date of Birth/Sex: Treating RN: [birth year] (67 y.o. [name] Primary Care Tonnette Zwiebel: Nelda Bucks Other Clinician: Valeria Batman Referring Aneka Fagerstrom: Treating Aryana Wonnacott/Extender: Doristine Bosworth in Treatment: 14 Vital Signs Time Taken: 10:19 Capillary Blood Glucose (mg/dl): 267 Height (in): 74 Reference Range: 80 - 120 mg / dl Weight (lbs): 245 Body Mass Index (BMI): 31.5 Electronic Signature(s) Signed: 12/17/2022 2:18:30 PM By: Valeria Batman EMT Entered By: Valeria Batman on 12/17/2022 14:18:30

## 2022-12-17 NOTE — Progress Notes (Signed)
LYFE, MONGER R ([id number]) [id number] Page 1 of 2 Visit Report for 12/17/2022 Problem List Details Patient Name: Date of Service: [name], [name] LD R. 12/17/2022 10:00 A M Medical Record Number: [id number] Patient Account Number: [id number] Date of Birth/Sex: Treating RN: 1956-02-07 (67 y.o. 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 12/17/2022 Medical Record Number: [id number] Patient Account Number: [id number] Date of Birth/Sex: Treating RN: 1955-12-29 (67 y.o. 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 L72.3 Sebaceous cyst 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, left ankle and foot E11.65 Type 2 diabetes mellitus with hyperglycemia E11.621 Type 2 diabetes mellitus with foot ulcer Facility Procedures : The patient participates with Medicare or their insurance follows the Medicare Facility Guidelines: CPT4 Code Description Modifier Quantity [id number]-(Facility Use Only) HBOT full body chamber, 50min , 4 ICD-10 Diagnosis Description [phone] Other  chronic osteomyelitis, 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 2 diabetes mellitus with foot  ulcer Physician Procedures : CPT4 Code Description Modifier [id number] - WC PHYS HYPERBARIC OXYGEN THERAPY ICD-10 Diagnosis Description 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

## 2022-12-17 NOTE — Progress Notes (Signed)
BRETTON, TANDY R ([id number]) [id number] Page 1 of 2 Visit Report for 12/17/2022 HBO Details Patient Name: Date of Service: [place], [name] 12/17/2022 10:00 A M Medical Record Number: [id number] Patient Account Number: [id number] Date of Birth/Sex: Treating RN: 07-Aug-1956 (67 y.o. 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 HBO Treatment Details Treatment Number: 28 Patient Type: Outpatient Chamber Type: Monoplace Chamber Serial #: [id number] Treatment Protocol: 2.0 ATA with 90 minutes oxygen, with two 5 minute air breaks Treatment Details Compression Rate Down: 2.0 psi / minute De-Compression Rate Up: 2.0 psi / minute A breaks and breathing ir Compress Tx Pressure periods Decompress Decompress Begins Reached (leave unused spaces Begins Ends blank) Chamber Pressure (ATA 1 2 2 2 2 2  --2 1 ) Clock Time (24 hr) 10:49 11:02 11:32 11:37 12:07 12:12 - - 12:42 12:53 Treatment Length: 124 (minutes) Treatment Segments: 4 Vital Signs Capillary Blood Glucose Reference Range: 80 - 120 mg / dl HBO Diabetic Blood Glucose Intervention Range: <131 mg/dl or >249 mg/dl Time Vitals Blood Respiratory Capillary Blood Glucose Pulse Action Type: Pulse: Temperature: Taken: Pressure: Rate: Glucose (mg/dl): Meter #: Oximetry (%) Taken: Pre 10:19 267 Post 13:00 151/74 70 18 97.2 192 Pre 09:28 163/79 86 18 98.1 Pre 10:40 245 Treatment Response Treatment Toleration: Well Treatment Completion Status: Treatment Completed without Adverse Event Physician HBO Attestation: I certify that I supervised this HBO  treatment in accordance with Medicare guidelines. A trained emergency response team is readily available per Yes hospital policies and procedures. Continue HBOT as ordered. Yes Electronic Signature(s) Signed: 12/17/2022 3:20:10 PM By: Fredirick Maudlin MD FACS Previous Signature: 12/17/2022 2:22:13 PM Version By: Valeria Batman EMT Entered By: Fredirick Maudlin on 12/17/2022 15:20:10 [name] ([id number]) [id number] Page 2 of 2 -------------------------------------------------------------------------------- HBO Safety Checklist Details Patient Name: Date of Service: [place], [name] 12/17/2022 10:00 A M Medical Record Number: [id number] Patient Account Number: [id number] Date of Birth/Sex: Treating RN: 1956-08-21 (67 y.o. [name] Primary Care Halei Hanover: Nelda Bucks Other Clinician: Valeria Batman Referring Maki Hege: Treating Tyquavious Gamel/Extender: Doristine Bosworth in Treatment: 14 HBO Safety Checklist Items Safety Checklist Consent Form Signed Patient voided / foley secured and emptied When did you last eato last night Pizza Last dose of injectable or oral agent 0645 Ostomy pouch emptied and vented if applicable NA All implantable devices assessed, documented and approved NA Intravenous access site secured and place NA Valuables secured Linens and cotton and cotton/polyester blend (less than 51% polyester) Personal oil-based products / skin lotions / body lotions removed Wigs or hairpieces removed NA Smoking or tobacco materials removed Books / newspapers / magazines / loose paper removed Cologne, aftershave, perfume and deodorant removed Jewelry removed (may wrap wedding band) Make-up removed NA Hair care products removed Battery operated devices (external) removed Heating patches and chemical warmers removed Titanium eyewear removed NA Nail polish cured greater than 10 hours NA Casting material cured  greater than 10 hours NA Hearing aids removed NA Loose dentures or partials removed removed by patient Prosthetics have been removed NA Patient demonstrates correct use of air break device (if applicable) Patient concerns have been addressed Patient grounding bracelet on and cord attached to chamber Specifics for Inpatients (complete in addition  to above) Medication sheet sent with patient NA Intravenous medications needed or due during therapy sent with patient NA Drainage tubes (e.g. nasogastric tube or chest tube secured and vented) NA Endotracheal or Tracheotomy tube secured NA Cuff deflated of air and inflated with saline NA Airway suctioned NA Notes The safety checklist was sone before the treatment was started. Electronic Signature(s) Signed: 12/17/2022 2:19:49 PM By: Valeria Batman EMT Entered By: Valeria Batman on 12/17/2022 14:19:49

## 2022-12-18 NOTE — Progress Notes (Addendum)
[name], [name] ([id number]) [id number] Page 1 of 8 Visit Report for 12/17/2022 Arrival Information Details Patient Name: Date of Service: Milmay, [name] 12/17/2022 9:15 A M Medical Record Number: [id number] Patient Account Number: [id number] Date of Birth/Sex: Treating RN: 13-Mar-1956 (67 y.o. [name] Primary Care Brodie Correll: Nelda Bucks Other Clinician: Referring Nevin Kozuch: Treating Ambreen Tufte/Extender: Doristine Bosworth in Treatment: 41 Visit Information History Since Last Visit Added or deleted any medications: No Patient Arrived: Knee Scooter Any new allergies or adverse reactions: No Arrival Time: 09:24 Had a fall or experienced change in No Accompanied By: self activities of daily living that may affect Transfer Assistance: None risk of falls: Patient Identification Verified: Yes Signs or symptoms of abuse/neglect since last visito No Secondary Verification Process Completed: Yes Hospitalized since last visit: No Patient Requires Transmission-Based Precautions: No Implantable device outside of the clinic excluding No Patient Has Alerts: No cellular tissue based products placed in the center since last visit: Has Dressing in Place as Prescribed: Yes Pain Present Now: No Electronic Signature(s) Signed: 12/17/2022 3:48:53 PM By: Adline Peals Entered By: Adline Peals on 12/17/2022 09:27:29 -------------------------------------------------------------------------------- Complex / Palliative Patient Assessment Details Patient Name: Date of Service: [name], [name]. 12/17/2022 9:15 A M Medical Record Number: [id number] Patient Account Number: [id number] Date of Birth/Sex: Treating RN: August 25, 1956 (67 y.o. [name] Primary Care Dorse Locy: Nelda Bucks Other Clinician: Referring Kaushal Vannice: Treating Alexey Rhoads/Extender: Doristine Bosworth in Treatment: 14 Complex Wound  Management Criteria Patient has remarkable or complex co-morbidities requiring medications or treatments that extend wound healing times. Examples: Diabetes mellitus with chronic renal failure or end stage renal disease requiring dialysis Advanced or poorly controlled rheumatoid arthritis Diabetes mellitus and end stage chronic obstructive pulmonary disease Active cancer with current chemo- or radiation therapy Chronic sinus problems/congestion; Middle ear problems, Type II Diabetes, Neuropathy, hypertension Palliative Wound Management Criteria Care Approach Wound Care Plan: Complex Wound Management Electronic Signature(s) Signed: 12/24/2022 4:29:43 PM By: Adline Peals Signed: 02/07/2023 5:13:43 PM By: Fredirick Maudlin MD FACS Entered By: Adline Peals on 12/24/2022 16:29:43 -------------------------------------------------------------------------------- Lower Extremity Assessment Details Patient Name: Date of Service: [name], [name]. 12/17/2022 9:15 A M Medical Record Number: [id number] Patient Account Number: [id number] Date of Birth/Sex: Treating RN: 03-Jan-1956 (67 y.o. [name] Primary Care Mcarthur Ivins: Nelda Bucks Other Clinician: Referring Fount Bahe: Treating Lakie Mclouth/Extender: Doristine Bosworth in Treatment: [address], [place] ([id number]) [id number] Page 2 of 8 Edema Assessment Assessed: [Left: No] [Right: No] [Left: Edema] [Right: :] Calf Left: Right: Point of Measurement: From Medial Instep 48 cm Ankle Left: Right: Point of Measurement: From Medial Instep 26 cm Electronic Signature(s) Signed: 12/17/2022 3:48:53 PM By: Adline Peals Entered By: Adline Peals on 12/17/2022 09:28:21 -------------------------------------------------------------------------------- Multi Wound Chart Details Patient Name: Date of Service: [name], [name]. 12/17/2022 9:15 A M Medical Record Number:  [id number] Patient Account Number: [id number] Date of Birth/Sex: Treating RN: 02-Jun-1956 (67 y.o. M) Primary Care Chalmers Iddings: Nelda Bucks Other Clinician: Referring Tija Biss: Treating Sadae Arrazola/Extender: Doristine Bosworth in Treatment: 14 Vital Signs Height(in): 26 Pulse(bpm): 37 Weight(lbs): 245 Blood Pressure(mmHg): 163/79 Body Mass Index(BMI): 31.5 Temperature(F): 98.1 Respiratory Rate(breaths/min): 18 [1:Photos:] [3:No Photos] Left Calcaneus Left, Dorsal Foot Right Shoulder Wound Location: Pressure Injury Pressure Injury Gradually Appeared Wounding Event: Diabetic Wound/Ulcer of the Lower Diabetic Wound/Ulcer of the Lower Cyst Primary Etiology: Extremity Extremity Cataracts, Sleep Apnea, Hypertension, Cataracts, Sleep Apnea, Hypertension, Cataracts, Sleep Apnea, Hypertension, Comorbid History:  Type II Diabetes, Osteomyelitis, Type II Diabetes, Osteomyelitis, Type II Diabetes, Osteomyelitis, Neuropathy Neuropathy Neuropathy 03/09/2019 07/09/2022 12/14/2022 Date Acquired: 14 14 0 Weeks of Treatment: Open Open Open Wound Status: No No No Wound Recurrence: 5.6x4.5x0.9 5x4.1x0.1 0.4x0.4x0.2 Measurements L x W x D (cm) 19.792 16.101 0.126 A (cm) : rea 17.813 1.61 0.025 Volume (cm) : 8.40% -70.80% N/A % Reduction in Area: 17.50% -70.90% N/A % Reduction in Volume: Grade 3 Grade 2 Full Thickness Without Exposed Classification: Support Structures Medium Medium Medium Exudate Amount: Serosanguineous Serosanguineous Serosanguineous Exudate Type: red, brown red, brown red, brown Exudate Color: Distinct, outline attached Distinct, outline attached Distinct, outline attached Wound Margin: Large (67-100%) Medium (34-66%) Large (67-100%) Granulation Amount: Red Red Red Granulation Quality: Small (1-33%) Medium (34-66%) None Present (0%) Necrotic Amount: Fat Layer (Subcutaneous Tissue): Yes Fat Layer (Subcutaneous Tissue): Yes Fat Layer  (Subcutaneous Tissue): Yes Exposed Structures: Fascia: No Fascia: No Fascia: No Tendon: No Tendon: No Tendon: No [name], [name] ([id number]) [id number] Page 3 of 8 Muscle: No Muscle: No Muscle: No Joint: No Joint: No Joint: No Bone: No Bone: No Bone: No Small (1-33%) Small (1-33%) Small (1-33%) Epithelialization: Debridement - Excisional Debridement - Excisional N/A Debridement: Pre-procedure Verification/Time Out 09:46 09:46 N/A Taken: Subcutaneous, Slough Necrotic/Eschar, Subcutaneous, N/A Tissue Debrided: Slough Skin/Subcutaneous Tissue Skin/Subcutaneous Tissue N/A Level: 20 14 N/A Debridement A (sq cm): rea Curette Curette N/A Instrument: Minimum Minimum N/A Bleeding: Pressure Pressure N/A Hemostasis A chieved: Procedure was tolerated well Procedure was tolerated well N/A Debridement Treatment Response: 5.6x4.5x0.9 5x4.1x0.1 N/A Post Debridement Measurements L x W x D (cm) 17.813 1.61 N/A Post Debridement Volume: (cm) Callus: Yes Scarring: Yes No Abnormalities Noted Periwound Skin Texture: Maceration: Yes No Abnormalities Noted No Abnormalities Noted Periwound Skin Moisture: No Abnormalities Noted No Abnormalities Noted Erythema: Yes Periwound Skin Color: No Abnormality No Abnormality No Abnormality Temperature: Debridement Debridement Incision and Drainage Procedures Performed: Treatment Notes Electronic Signature(s) Signed: 12/17/2022 10:56:43 AM By: Fredirick Maudlin MD FACS Entered By: Fredirick Maudlin on 12/17/2022 10:56:43 -------------------------------------------------------------------------------- Multi-Disciplinary Care Plan Details Patient Name: Date of Service: [name], [name]. 12/17/2022 9:15 A M Medical Record Number: [id number] Patient Account Number: [id number] Date of Birth/Sex: Treating RN: [birth year] (67 y.o. 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 and treatment procedures Potential for barotraumas to ears, sinuses, teeth, and lungs or cerebral gas embolism related to changes in atmospheric pressure inside hyperbaric oxygen chamber Potential for oxygen toxicity seizures related to delivery of 100% oxygen at an increased atmospheric pressure Potential for pulmonary oxygen toxicity related to delivery of 100% oxygen at an increased atmospheric pressure Goals: Barotrauma will be prevented during HBO2 Date Initiated: 10/06/2022 Target Resolution Date: 01/14/2023 Goal Status: Active Patient and/or family will be able to state/discuss factors appropriate to the management of their disease process during treatment Date Initiated: 10/06/2022 Target Resolution Date: 01/14/2023 Goal Status: Active Patient will tolerate the hyperbaric oxygen therapy treatment Date Initiated: 10/06/2022 Target Resolution Date: 01/14/2023 Goal Status: Active Patient/caregiver will verbalize understanding of HBO goals, rationale, procedures and potential hazards Date Initiated: 10/06/2022 Target Resolution Date: 01/14/2023 Goal Status: Active Interventions: Administer decongestants, per physician orders, prior to HBO2 Administer the correct therapeutic gas delivery based on the patients needs and limitations, per physician order Assess and provide for patients comfort  related to the hyperbaric environment and equalization of middle ear Assess for signs and symptoms related to adverse events, including but not limited to confinement anxiety, pneumothorax, oxygen toxicity and HERSHELL, BOWMER [name] ([id number]) [id number] Page 4 of 8 Notes: Wound/Skin Impairment Nursing  Diagnoses: Impaired tissue integrity Goals: Patient/caregiver will verbalize understanding of skin care regimen Date Initiated: 10/06/2022 Target Resolution Date: 01/14/2023 Goal Status: Active Ulcer/skin breakdown will have a volume reduction of 30% by week 4 Date Initiated: 09/10/2022 Date Inactivated: 10/06/2022 Target Resolution Date: 10/08/2022 Goal Status: Unmet Unmet Reason: osteo, HBOT Ulcer/skin breakdown will have a volume reduction of 50% by week 8 Date Initiated: 10/06/2022 Target Resolution Date: 01/14/2023 Goal Status: Active Interventions: Assess ulceration(s) every visit Provide education on ulcer and skin care Treatment Activities: Consult for HBO : 09/10/2022 Skin care regimen initiated : 09/10/2022 Notes: Electronic Signature(s) Signed: 12/17/2022 3:48:53 PM By: Adline Peals Entered By: Adline Peals on 12/17/2022 09:24:28 -------------------------------------------------------------------------------- Pain Assessment Details Patient Name: Date of Service: [name], [name]. 12/17/2022 9:15 A M Medical Record Number: [id number] Patient Account Number: [id number] Date of Birth/Sex: Treating RN: 1956-08-05 (67 y.o. [name] Primary Care Kresta Templeman: Nelda Bucks Other Clinician: Referring Cleopatra Sardo: Treating Elisavet Buehrer/Extender: Doristine Bosworth in Treatment: 14 Active Problems Location of Pain Severity and Description of Pain Patient Has Paino No Site Locations Rate the pain. Current Pain Level: 0 Pain Management and Medication Current Pain Management: Electronic Signature(s) Signed: 12/17/2022 3:48:53 PM By: Adline Peals Entered By: Adline Peals on 12/17/2022 09:28:15 [name], [name] ([id number]) [id number] Page 5 of 8 -------------------------------------------------------------------------------- Patient/Caregiver Education Details Patient Name: Date of Service: Bentley, [name] [place]  [name]. 1/5/2024andnbsp9:15 A M Medical Record Number: [id number] Patient Account Number: [id number] Date of Birth/Gender: Treating RN: [birth year] (67 y.o. [name] Primary Care Physician: Nelda Bucks Other Clinician: Referring Physician: Treating Physician/Extender: Doristine Bosworth in Treatment: 14 Education Assessment Education Provided To: Patient Education Topics Provided Wound/Skin Impairment: Methods: Explain/Verbal Responses: Reinforcements needed, State content correctly Electronic Signature(s) Signed: 12/17/2022 3:48:53 PM By: Adline Peals Entered By: Adline Peals on 12/17/2022 09:24:40 -------------------------------------------------------------------------------- Wound Assessment Details Patient Name: Date of Service: [name], [name]. 12/17/2022 9:15 A M Medical Record Number: [id number] Patient Account Number: [id number] Date of Birth/Sex: Treating RN: 08-31-1956 (67 y.o. [name] Primary Care Keia Rask: Nelda Bucks Other Clinician: Referring Kaden Daughdrill: Treating Kairav Russomanno/Extender: Doristine Bosworth in Treatment: 14 Wound Status Wound Number: 1 Primary Diabetic Wound/Ulcer of the Lower Extremity Etiology: Wound Location: Left Calcaneus Wound Open Wounding Event: Pressure Injury Status: Date Acquired: 03/09/2019 Comorbid Cataracts, Sleep Apnea, Hypertension, Type II Diabetes, Weeks Of Treatment: 14 History: Osteomyelitis, Neuropathy Clustered Wound: No Photos Wound Measurements Length: (cm) 5.6 Width: (cm) 4.5 Depth: (cm) 0.9 Area: (cm) 19.792 Volume: (cm) 17.813 % Reduction in Area: 8.4% % Reduction in Volume: 17.5% Epithelialization: Small (1-33%) Tunneling: No Undermining: No Wound Description Classification: Grade 3 Wound Margin: Distinct, outline attached [name], [name] ([id number]) Exudate Amount: Medium Exudate Type: Serosanguineous Exudate Color: red,  brown Foul Odor After Cleansing: No Slough/Fibrino Yes [id number].pdf Page 6 of 8 Wound Bed Granulation Amount: Large (67-100%) Exposed Structure Granulation Quality: Red Fascia Exposed: No Necrotic Amount: Small (1-33%) Fat Layer (Subcutaneous Tissue) Exposed: Yes Necrotic Quality: Adherent Slough Tendon Exposed: No Muscle Exposed: No Joint Exposed: No Bone Exposed: No Periwound Skin Texture Texture Color No Abnormalities Noted: No No Abnormalities Noted: Yes Callus: Yes Temperature / Pain Temperature: No Abnormality Moisture No Abnormalities  Noted: No Maceration: Yes Electronic Signature(s) Signed: 12/17/2022 3:48:53 PM By: Adline Peals Entered By: Adline Peals on 12/17/2022 09:37:19 -------------------------------------------------------------------------------- Wound Assessment Details Patient Name: Date of Service: [name], [name]. 12/17/2022 9:15 A M Medical Record Number: [id number] Patient Account Number: [id number] Date of Birth/Sex: Treating RN: 14-Nov-1956 (67 y.o. [name] Primary Care Jaquanda Wickersham: Nelda Bucks Other Clinician: Referring Rosette Bellavance: Treating Maddock Finigan/Extender: Doristine Bosworth in Treatment: 14 Wound Status Wound Number: 2 Primary Diabetic Wound/Ulcer of the Lower Extremity Etiology: Wound Location: Left, Dorsal Foot Wound Open Wounding Event: Pressure Injury Status: Date Acquired: 07/09/2022 Comorbid Cataracts, Sleep Apnea, Hypertension, Type II Diabetes, Weeks Of Treatment: 14 History: Osteomyelitis, Neuropathy Clustered Wound: No Photos Wound Measurements Length: (cm) 5 Width: (cm) 4.1 Depth: (cm) 0.1 Area: (cm) 16.101 Volume: (cm) 1.61 % Reduction in Area: -70.8% % Reduction in Volume: -70.9% Epithelialization: Small (1-33%) Tunneling: No Undermining: No Wound Description Classification: Grade 2 Wound Margin: Distinct, outline attached Exudate Amount:  Medium Exudate Type: Serosanguineous Exudate Color: red, brown [name], [name] ([id number]) Foul Odor After Cleansing: No Slough/Fibrino Yes [phone].pdf Page 7 of 8 Wound Bed Granulation Amount: Medium (34-66%) Exposed Structure Granulation Quality: Red Fascia Exposed: No Necrotic Amount: Medium (34-66%) Fat Layer (Subcutaneous Tissue) Exposed: Yes Necrotic Quality: Adherent Slough Tendon Exposed: No Muscle Exposed: No Joint Exposed: No Bone Exposed: No Periwound Skin Texture Texture Color No Abnormalities Noted: No No Abnormalities Noted: Yes Scarring: Yes Temperature / Pain Temperature: No Abnormality Moisture No Abnormalities Noted: Yes Electronic Signature(s) Signed: 12/17/2022 3:48:53 PM By: Adline Peals Entered By: Adline Peals on 12/17/2022 09:38:04 -------------------------------------------------------------------------------- Wound Assessment Details Patient Name: Date of Service: [name], [name]. 12/17/2022 9:15 A M Medical Record Number: [id number] Patient Account Number: [id number] Date of Birth/Sex: Treating RN: July 24, 1956 (67 y.o. [name] Primary Care Denessa Cavan: Nelda Bucks Other Clinician: Referring Jaiyana Canale: Treating Jing Howatt/Extender: Doristine Bosworth in Treatment: 14 Wound Status Wound Number: 3 Primary Cyst Etiology: Wound Location: Right Shoulder Wound Open Wounding Event: Gradually Appeared Status: Date Acquired: 12/14/2022 Comorbid Cataracts, Sleep Apnea, Hypertension, Type II Diabetes, Weeks Of Treatment: 0 History: Osteomyelitis, Neuropathy Clustered Wound: No Wound Measurements Length: (cm) 0.4 Width: (cm) 0.4 Depth: (cm) 0.2 Area: (cm) 0.126 Volume: (cm) 0.025 % Reduction in Area: % Reduction in Volume: Epithelialization: Small (1-33%) Tunneling: No Undermining: No Wound Description Classification: Full Thickness Without Exposed Support  Structures Wound Margin: Distinct, outline attached Exudate Amount: Medium Exudate Type: Serosanguineous Exudate Color: red, brown Foul Odor After Cleansing: No Slough/Fibrino No Wound Bed Granulation Amount: Large (67-100%) Exposed Structure Granulation Quality: Red Fascia Exposed: No Necrotic Amount: None Present (0%) Fat Layer (Subcutaneous Tissue) Exposed: Yes Tendon Exposed: No Muscle Exposed: No Joint Exposed: No Bone Exposed: No Periwound Skin Texture Texture Color No Abnormalities Noted: Yes No Abnormalities Noted: No Erythema: Yes Moisture No Abnormalities Noted: Yes Temperature / Pain Temperature: No Abnormality Electronic Signature(s) [name], [name] ([id number]) [id number] Page 8 of 8 Signed: 12/17/2022 3:48:53 PM By: Sabas Sous By: Adline Peals on 12/17/2022 09:51:12 -------------------------------------------------------------------------------- Vitals Details Patient Name: Date of Service: [name], [name]. 12/17/2022 9:15 A M Medical Record Number: [id number] Patient Account Number: [id number] Date of Birth/Sex: Treating RN: 28-Jul-1956 (67 y.o. [name] Primary Care Dilon Lank: Nelda Bucks Other Clinician: Referring Fady Stamps: Treating Taygen Acklin/Extender: Doristine Bosworth in Treatment: 14 Vital Signs Time Taken: 09:28 Temperature (F): 98.1 Height (in): 74 Pulse (bpm): 86 Weight (lbs): 245 Respiratory Rate (breaths/min): 18  Body Mass Index (BMI): 31.5 Blood Pressure (mmHg): 163/79 Reference Range: 80 - 120 mg / dl Electronic Signature(s) Signed: 12/17/2022 3:48:53 PM By: Adline Peals Entered By: Adline Peals on 12/17/2022 [id number]

## 2022-12-18 NOTE — Progress Notes (Signed)
[name], [name] ([id number]) [id number] Page 1 of 14 Visit Report for 12/17/2022 Chief Complaint Document Details Patient Name: Date of Service: [name], [name]. 12/17/2022 9:15 A M Medical Record Number: [id number] Patient Account Number: [id number] Date of Birth/Sex: Treating RN: [birth year] (67 y.o. M) Primary Care Provider: Foye DeerSchultz, Douglas Other Clinician: Referring Provider: Treating Provider/Extender: Kandis Cockingannon, Yailin Biederman Schultz, Douglas Weeks in Treatment: 14 Information Obtained from: Patient Chief Complaint Patients presents for treatment of an open diabetic ulcer and evaluation for hyperbaric oxygen therapy Electronic Signature(s) Signed: 12/17/2022 10:56:51 AM By: Duanne Guessannon, Saleha Kalp MD FACS Entered By: Duanne Guessannon, Theophilus Walz on 12/17/2022 10:56:51 -------------------------------------------------------------------------------- Debridement Details Patient Name: Date of Service: [name], [name]. 12/17/2022 9:15 A M Medical Record Number: [id number] Patient Account Number: [id number] Date of Birth/Sex: Treating RN: [birth year] (67 y.o. [name]) Herrington, Taylor Primary Care Provider: Foye DeerSchultz, Douglas Other Clinician: Referring Provider: Treating Provider/Extender: Kandis Cockingannon, Trenisha Lafavor Schultz, Douglas Weeks in Treatment: 14 Debridement Performed for Assessment: Wound #2 Left,Dorsal Foot Performed By: Physician Duanne Guessannon, Sanjuanita Condrey, MD Debridement Type: Debridement Severity of Tissue Pre Debridement: Fat layer exposed Level of Consciousness (Pre-procedure): Awake and Alert Pre-procedure Verification/Time Out Yes - 09:46 Taken: Start Time: 09:46 T Area Debrided (L x W): otal 4 (cm) x 3.5 (cm) = 14 (cm) Tissue and other material debrided: Non-Viable, Eschar, Slough, Subcutaneous, Slough Level: Skin/Subcutaneous Tissue Debridement Description: Excisional Instrument: Curette Bleeding: Minimum Hemostasis Achieved: Pressure Response to Treatment: Procedure was tolerated  well Level of Consciousness (Post- Awake and Alert procedure): Post Debridement Measurements of Total Wound Length: (cm) 5 Width: (cm) 4.1 Depth: (cm) 0.1 Volume: (cm) 1.61 Character of Wound/Ulcer Post Debridement: Improved Severity of Tissue Post Debridement: Fat layer exposed Post Procedure Diagnosis Same as Pre-procedure [name], [name] ([id number]) [id number] Page 2 of 14 Notes scribed for Dr. Lady Garyannon by Samuella Bruinaylor Herrington, RN Electronic Signature(s) Signed: 12/17/2022 1:30:14 PM By: Duanne Guessannon, Mertha Clyatt MD FACS Signed: 12/17/2022 3:48:53 PM By: Gelene MinkHerrington, Taylor Entered By: Samuella BruinHerrington, Taylor on 12/17/2022 09:47:51 -------------------------------------------------------------------------------- Debridement Details Patient Name: Date of Service: [name], [place] [name]. 12/17/2022 9:15 A M Medical Record Number: [id number] Patient Account Number: [id number] Date of Birth/Sex: Treating RN: [birth year] (67 y.o. [name]) Herrington, Taylor Primary Care Provider: Foye DeerSchultz, Douglas Other Clinician: Referring Provider: Treating Provider/Extender: Kandis Cockingannon, Kesler Wickham Schultz, Douglas Weeks in Treatment: 14 Debridement Performed for Assessment: Wound #1 Left Calcaneus Performed By: Physician Duanne Guessannon, Alder Murri, MD Debridement Type: Debridement Severity of Tissue Pre Debridement: Fat layer exposed Level of Consciousness (Pre-procedure): Awake and Alert Pre-procedure Verification/Time Out Yes - 09:46 Taken: Start Time: 09:46 T Area Debrided (L x W): otal 5 (cm) x 4 (cm) = 20 (cm) Tissue and other material debrided: Non-Viable, Slough, Subcutaneous, Slough Level: Skin/Subcutaneous Tissue Debridement Description: Excisional Instrument: Curette Bleeding: Minimum Hemostasis Achieved: Pressure Response to Treatment: Procedure was tolerated well Level of Consciousness (Post- Awake and Alert procedure): Post Debridement Measurements of Total Wound Length: (cm) 5.6 Width: (cm)  4.5 Depth: (cm) 0.9 Volume: (cm) 17.813 Character of Wound/Ulcer Post Debridement: Improved Severity of Tissue Post Debridement: Fat layer exposed Post Procedure Diagnosis Same as Pre-procedure Notes scribed for Dr. Lady Garyannon by Samuella Bruinaylor Herrington, RN Electronic Signature(s) Signed: 12/17/2022 1:30:14 PM By: Duanne Guessannon, Vendetta Pittinger MD FACS Signed: 12/17/2022 3:48:53 PM By: Samuella BruinHerrington, Taylor Entered By: Samuella BruinHerrington, Taylor on 12/17/2022 09:56:18 -------------------------------------------------------------------------------- HPI Details Patient Name: Date of Service: [name], [name]. 12/17/2022 9:15 A M Medical Record Number: [id number] Patient Account Number: [id number] [name], [name] ([id number]) [id number] Page 3 of 14 Date of Birth/Sex: Treating RN: [birth year] (  67 y.o. M) Primary Care Provider: Other Clinician: Foye [name] Referring Provider: Treating Provider/Extender: Kandis Cocking in Treatment: 14 History of Present Illness HPI Description: ADMISSION 09/10/2022 This is a 67 year old poorly controlled type II diabetic (last hemoglobin A1c 10.8%) who has had an ulcer on his heel for over 3 years. He has been seen in multiple wound care centers, including [facility] and [name]. He reports that at least 3 doctors have recommended that he undergo below-knee amputation. He most recently met with Dr. Reuel [name], a vascular surgeon affiliated with [facility]. 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 Unfortunately, the patient has some extenuating social circumstances including the fact that he cares for his wife who has stage IV colon cancer and still works, driving vehicles for [employer]. 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 He was recently hospitalized at [facility] for sepsis and currently has a PICC line  through which he receives IV antibiotics. He reports having had another MRI during that hospital stay along with a chest x-ray and EKG. He apparently contacted one of the hyperbaric therapy techs here and asked a number of questions about hyperbaric oxygen treatments. He subsequently self-referred to our center to undergo further evaluation and management. I mention to him that [facility] actually has hyperbaric chambers, but he states that he lives in [place] and this would be more convenient for him given the intensive nature of the therapy and time requirement. ABI in clinic today was 0.94. The patient actually has 2 wounds. There is a wound on the dorsum of his left foot with heavy black eschar and slough present. After debridement, this was demonstrated to involve the muscle and the extensor tendons are exposed. On his heel, there is essentially a "shark bite" type wound, with much of the heel fat pad absent. The muscle layer is exposed. There is blue-green staining around the perimeter of the wound, but no significant odor. He says he has been applying collagen to the wound on his heel and Silvadene and Betadine to the wound on his dorsal foot. 09/20/2022: The heel wound is quite macerated with wet periwound callus. There is slough accumulation on the surface. The dorsal foot wound looks better this week. There is still exposed tendon, but it is fairly clean with just a little biofilm buildup. We are still working on gathering the required documentation to submit for pretreatment review for hyperbaric oxygen therapy. 09/29/2022: The dorsal foot wound continues to improve. I do not see any exposed tendon at this point. There is just some slough accumulation on the wound surface. He continues to have very wet macerated periwound callus on his heel. There is slough on the surface, but it is loose and thin. There is an area of undermining at the 11 o'clock position, but the overlying skin and  subcutaneous tissue is healthy and viable. He has been approved for hyperbaric oxygen therapy and will start treatment tomorrow. 10/06/2022: Continued contraction and improvement of the dorsal foot wound. There is just a little bit of slough accumulation on the wound surface. The periwound callus continues to accumulate on the heel wound and it is quite macerated. It is also persistently found with blue-green staining present. He did initiate his hyperbaric oxygen therapy, but has had significant difficulty with decompression. He will be going to ENT to have PE tubes placed. 10/18/2022: His hyperbaric oxygen therapy  is on hold while his otological issues are being addressed. He came in again today with the periwound skin on both the dorsal aspect of his foot and his calcaneus completely macerated. The blue-green discoloration, however, has abated and the undermining on the calcaneus has improved. The dorsal foot wound has also contracted somewhat. 10/26/2022: The dorsal foot wound continues to contract and fill with good granulation tissue. The heel, once again, has a rim of macerated callus, but the undermining and tunneling continues to contract. He has completed his oral antibiotics and [facility] for his dressing changes at home. He is scheduled to see ENT this afternoon. 11/08/2022: The dorsal foot wound is flush with the surrounding skin and has a good granulation tissue surface. There is some slough accumulation. As usual, the heel has a rim of macerated callus but the dimensions are smaller and the tunneling and undermining have contracted further. He has resumed his hyperbaric oxygen therapy. 12/12; patient seen for wound evaluation. He is tolerating HBO although we could not dive him yesterday because of relative hypoglycemia and the fact he had given himself NovoLog insulin before he came to clinic. Today his blood sugar is in the 180 range she  should be fine. He has a large wound on the plantar calcaneus on the right and a more superficial area on the dorsal foot. He is using a scooter for offloading 11/30/2022: The dorsal foot wound continues to contract. There is some slough on the wound surface. The large calcaneus wound has heaped up wet callus around the margins. Continued undermining. 12/08/2022: The dorsal foot wound is flat and flush with the surrounding skin surface. There is some slough present. Once again, the calcaneal wound has thick, absolutely macerated callus hanging off in tatters around the edges. The patient cannot explain to me why this part of his wound gets so wet. There is slough on the surface. The undermined portion of the wound has filled in. 12/17/2022: Earlier this week, he presented for his hyperbaric oxygen therapy and was hypotensive and ill-appearing. He also had what appeared to be an infected sebaceous cyst on his right upper arm. He was sent to the emergency department. He was given a fluid bolus and the ED provider lanced the cyst. He was prescribed doxycycline. He seems to be feeling better today. He notes that he has had copious drainage from his foot. On further questioning, he states that he has not been taking his oral diuretic. The dorsal foot wound has expanded somewhat, but is actually more superficial. The plantar heel wound is about the same. Electronic Signature(s) Signed: 12/17/2022 11:01:14 AM By: Duanne Guessannon, Meziah Blasingame MD FACS Entered By: Duanne Guessannon, Adelaine Roppolo on 12/17/2022 11:01:14 -------------------------------------------------------------------------------- Incision and Drainage Details Patient Name: Date of Service: [place] [name]. 12/17/2022 9:15 A M Medical Record Number: [id number] Patient Account Number: [id number] [name], Tamaj [name] ([id number]) [id number] Page 4 of 14 Date of Birth/Sex: Treating RN: 09-11-1956 [age] y.o. [name]) Herrington, Taylor Primary Care Provider:  Other Clinician: Foye DeerSchultz, Douglas Referring Provider: Treating Provider/Extender: Kandis Cockingannon, Alyx Mcguirk Schultz, Douglas Weeks in Treatment: 14 Incision And Drainage Performed Wound #3 Right Shoulder for: Performed By: Physician Duanne Guessannon, Delmer Kowalski, MD Incision And Drainage Type: Abscess Location: right shoulder Level of Consciousness (Pre- Awake and Alert procedure): Pre-procedure Verification/Time Out Yes - 09:55 Taken: Pain Control: Lidocaine Injectable Drainage Of: Purulent Instrument: Blade Bleeding: Minimum Hemostasis Achieved: Pressure Culture Sent: None Procedural Pain: 0 Post Procedural Pain: 0 Response to Treatment: Procedure  was tolerated well Level of Consciousness (Post- Awake and Alert procedure): Post Procedure Diagnosis Same as Pre-procedure Notes scribed for Dr. Lady Gary by Samuella Bruin, RN Electronic Signature(s) Signed: 12/17/2022 1:30:14 PM By: [name] MD FACS Signed: 12/17/2022 3:48:53 PM By: Gelene Mink By: Samuella Bruin on 12/17/2022 09:56:06 -------------------------------------------------------------------------------- Physical Exam Details Patient Name: Date of Service: [place], [name]. 12/17/2022 9:15 A M Medical Record Number: [id number] Patient Account Number: [id number] Date of Birth/Sex: Treating RN: 1956-08-25 (67 y.o. M) Primary Care Provider: Foye [name] Other Clinician: Referring Provider: Treating Provider/Extender: Kandis Cocking in Treatment: 14 Constitutional Hypertensive, asymptomatic. . . . no acute distress. Respiratory Normal work of breathing on room air. Notes 12/17/2022: He has had copious drainage from his foot. On further questioning, he states that he has not been taking his oral diuretic. The dorsal foot wound has expanded somewhat, but is actually more superficial. The plantar heel wound is about the same. There is a tiny stab incision on his right upper arm  with purulent material draining from it. Electronic Signature(s) Signed: 12/17/2022 11:02:19 AM By: [name] MD FACS Entered By: [name] on 12/17/2022 11:02:18 [name] [name] ([id number]) [id number] Page 5 of 14 -------------------------------------------------------------------------------- Physician Orders Details Patient Name: Date of Service: [place], [name] 12/17/2022 9:15 A M Medical Record Number: [id number] Patient Account Number: [id number] Date of Birth/Sex: Treating RN: Mar 05, 1956 (67 y.o. 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 - Dr. Lady Gary Rm 4 Return A Anesthetic (In clinic) Xylocaine 1% injection applied to wound bed Cellular or Tissue Based Products Other Cellular or Tissue Based Products Orders/Instructions: - run insurance for Apligraf Bathing/ Shower/ Hygiene May shower and wash wound with soap and water. Negative Presssure Wound Therapy Wound Vac to wound continuously at 14mm/hg pressure - to left calcaneus Black Foam Edema Control - Lymphedema / SCD / Other Left Lower Extremity Avoid standing for long periods of time. Home Health Admit to Home Health for skilled nursing wound care. May utilize formulary equivalent dressing for wound treatment orders unless otherwise specified. New wound care orders this week;  continue Home Health for wound care. May utilize formulary equivalent dressing for wound treatment orders unless otherwise specified. Dressing changes to be completed by Home Health on Monday / Wednesday / Friday except when patient has scheduled visit at [facility]. Hyperbaric Oxygen Therapy Wound #1 Left Calcaneus Evaluate for HBO Therapy Indication: - chronic refractory osteomyelitis If appropriate for treatment, begin HBOT per protocol: 2.5 ATA for 90 Minutes with 2 Five (5) Minute A Breaks ir Total Number of Treatments: - 40 One treatments per day (delivered Monday through Friday unless otherwise specified in Special Instructions below): Finger stick Blood Glucose Pre- and Post- HBOT Treatment. Follow Hyperbaric Oxygen Glycemia Protocol A frin (Oxymetazoline HCL) 0.05% nasal spray - 1 spray in both nostrils daily as needed prior to HBO treatment for difficulty clearing ears Wound Treatment Wound #1 - Calcaneus Wound Laterality: Left Cleanser: Normal Saline Discharge  Instructions: Cleanse the wound with Normal Saline prior to applying a clean dressing using gauze sponges, not tissue or cotton balls. Cleanser: Soap and Water Discharge Instructions: May shower and wash wound with dial antibacterial soap and water prior to dressing change. Peri-Wound Care: [place], [place] [name] ([id number]) [id number] Page 6 of 14 Prim Dressing: Sorbalgon TransMontaigne, 4x4 (in/in) ary Discharge Instructions: Apply to wound bed as instructed Secondary Dressing: Woven Gauze Sponge, Non-Sterile 4x4 in Discharge Instructions: Apply over primary dressing as directed. Secondary Dressing: Zetuvit Plus 4x8 in Discharge Instructions: Apply over primary dressing as directed. Secured With: Elastic Bandage 4 inch (ACE bandage) Discharge Instructions: Secure with ACE bandage as directed. Secured With: [employer], 4.5x3.1 (in/yd) Discharge Instructions: Secure with  Kerlix as directed. Wound #2 - Foot Wound Laterality: Dorsal, Left Cleanser: Normal Saline Discharge Instructions: Cleanse the wound with Normal Saline prior to applying a clean dressing using gauze sponges, not tissue or cotton balls. Cleanser: Soap and Water Discharge Instructions: May shower and wash wound with dial antibacterial soap and water prior to dressing change. Peri-Wound Care: keystone Prim Dressing: Sorbalgon TransMontaigne, 4x4 (in/in) ary Discharge Instructions: Apply to wound bed as instructed Secondary Dressing: Woven Gauze Sponge, Non-Sterile 4x4 in Discharge Instructions: Apply over primary dressing as directed. Secondary Dressing: Zetuvit Plus 4x8 in Discharge Instructions: Apply over primary dressing as directed. Secured With: Elastic Bandage 4 inch (ACE bandage) Discharge Instructions: Secure with ACE bandage as directed. Secured With: [employer], 4.5x3.1 (in/yd) Discharge Instructions: Secure with Kerlix as directed. Wound #3 - Shoulder Wound Laterality: Right Cleanser: Soap and Water 1 x Per Day/30 Days Discharge Instructions: May shower and wash wound with dial antibacterial soap and water prior to dressing change. Cleanser: Wound Cleanser 1 x Per Day/30 Days Discharge Instructions: Cleanse the wound with wound cleanser prior to applying a clean dressing using gauze sponges, not tissue or cotton balls. Peri-Wound Care: Skin Prep 1 x Per Day/30 Days Discharge Instructions: Use skin prep as directed Prim Dressing: Iodoform packing strip 1/4 (in) 1 x Per Day/30 Days ary Discharge Instructions: Lightly pack as instructed Secondary Dressing: Zetuvit Plus Silicone Border Dressing 4x4 (in/in) 1 x Per Day/30 Days Discharge Instructions: Apply silicone border over primary dressing as directed. Patient Medications llergies: bee venom protein (honey bee) A Notifications Medication Indication Start End 12/17/2022 Xylocaine DOSE injection 10 mg/mL (1 %) solution -  solution injection GLYCEMIA INTERVENTIONS PROTOCOL PRE-HBO GLYCEMIA INTERVENTIONS ACTION INTERVENTION Obtain pre-HBO capillary blood glucose (ensure 1 physician order is in chart). A. Notify HBO physician and await physician orders. 2 If result is 70 mg/dl or below: B. If the result meets the hospital definition of a critical result, follow hospital policy. A. Give patient an 8 ounce Glucerna Shake, an 8 ounce Ensure, or 8 ounces of a Glucerna/Ensure equivalent dietary [name], [name] ([id number]) [id number] Page 7 of 14 supplement*. B. Wait 30 minutes. If result is 71 mg/dl to 782 mg/dl: C. Retest patients capillary blood glucose (CBG). D. If result greater than or equal to 110 mg/dl, proceed with HBO. If result less than 110 mg/dl, notify HBO physician and consider holding HBO. If result is 131 mg/dl to 956 mg/dl: A. Proceed with HBO. A. Notify HBO physician and await physician orders. B. It is recommended to hold HBO and do If result is 250 mg/dl or greater: blood/urine ketone testing. C. If the result meets the hospital definition of a critical result, follow hospital policy. POST-HBO GLYCEMIA INTERVENTIONS ACTION INTERVENTION Obtain post  HBO capillary blood glucose (ensure 1 physician order is in chart). A. Notify HBO physician and await physician orders. 2 If result is 70 mg/dl or below: B. If the result meets the hospital definition of a critical result, follow hospital policy. A. Give patient an 8 ounce Glucerna Shake, an 8 ounce Ensure, or 8 ounces of a Glucerna/Ensure equivalent dietary supplement*. B. Wait 15 minutes for symptoms of If result is 71 mg/dl to 161 mg/dl: hypoglycemia (i.e. nervousness, anxiety, sweating, chills, clamminess, irritability, confusion, tachycardia or dizziness). C. If patient asymptomatic, discharge patient. If patient symptomatic, repeat capillary blood glucose (CBG) and notify HBO  physician. If result is 101 mg/dl to 096 mg/dl: A. Discharge patient. A. Notify HBO physician and await physician orders. B. It is recommended to do blood/urine ketone If result is 250 mg/dl or greater: testing. C. If the result meets the hospital definition of a critical result, follow hospital policy. *Juice or candies are NOT equivalent products. If patient refuses the Glucerna or Ensure, please consult the hospital dietitian for an appropriate substitute. Electronic Signature(s) Signed: 12/17/2022 1:30:14 PM By: [name] MD FACS Entered By: [name] on 12/17/2022 11:02:34 -------------------------------------------------------------------------------- Problem List Details Patient Name: Date of Service: [place], [name]. 12/17/2022 9:15 A M Medical Record Number: [id number] Patient Account Number: [id number] Date of Birth/Sex: Treating RN: 1956-07-02 (67 y.o. 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 09/10/2022 No Yes L97.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 09/10/2022 No Yes L72.3 Sebaceous cyst 12/17/2022 No Yes [name], [name] ([id number]) [id number].pdf Page 8 of [phone] Other chronic osteomyelitis, left ankle and foot 09/10/2022 No Yes E11.65 Type 2 diabetes mellitus with hyperglycemia 09/10/2022 No Yes E11.621 Type 2 diabetes mellitus with foot ulcer 09/10/2022 No Yes Inactive Problems Resolved Problems Electronic Signature(s) Signed: 12/17/2022 10:56:33 AM By: [name] MD FACS Entered By: [name] on 12/17/2022  10:56:33 -------------------------------------------------------------------------------- Progress Note Details Patient Name: Date of Service: [name] Blazing, [name]. 12/17/2022 9:15 A M Medical Record Number: [id number] Patient Account Number: [id number] Date of Birth/Sex: Treating RN: [birth year] (67 y.o. M) Primary Care Provider: Foye [name] Other Clinician: Referring Provider: Treating Provider/Extender: Kandis Cocking in Treatment: 14 Subjective Chief Complaint Information obtained from Patient Patients presents for treatment of an open diabetic ulcer and evaluation for hyperbaric oxygen therapy History of Present Illness (HPI) ADMISSION 09/10/2022 This is a 67 year old poorly controlled type II diabetic (last hemoglobin A1c 10.8%) who has had an ulcer on his heel for over 3 years. He has been seen in multiple wound care centers, including [facility] and [facility]. He reports that at least 3 doctors have recommended that he undergo below-knee amputation. He most recently met with Dr. Reuel [name], a vascular surgeon affiliated with [facility]. 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 Unfortunately, the patient has some extenuating social circumstances including the fact that he cares for his wife who has stage IV colon cancer and still works, driving vehicles for [employer]. 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 He was recently hospitalized at [facility] for sepsis and currently has a PICC line through which he receives IV  antibiotics. He reports having had another MRI during that hospital stay along with a chest x-ray and EKG. He apparently contacted one of the hyperbaric therapy techs here and asked a number of questions about hyperbaric oxygen treatments. He subsequently self-referred to our center to undergo further evaluation and  management. I mention to him that [facility] actually has hyperbaric chambers, but he states that he lives in [place] and this would be more convenient for him given the intensive nature of the therapy and time requirement. ABI in clinic today was 0.94. The patient actually has 2 wounds. There is a wound on the dorsum of his left foot with heavy black eschar and slough present. After debridement, this was demonstrated to involve the muscle and the extensor tendons are exposed. On his heel, there is essentially a "shark bite" type wound, with much of the heel fat pad absent. The muscle layer is exposed. There is blue-green staining around the perimeter of the wound, but no significant odor. He says he has been applying collagen to the wound on his heel and Silvadene and Betadine to the wound on his dorsal foot. 09/20/2022: The heel wound is quite macerated with wet periwound callus. There is slough accumulation on the surface. The dorsal foot wound looks better this week. There is still exposed tendon, but it is fairly clean with just a little biofilm buildup. We are still working on gathering the required documentation to submit for pretreatment review for hyperbaric oxygen therapy. 09/29/2022: The dorsal foot wound continues to improve. I do not see any exposed tendon at this point. There is just some slough accumulation on the wound surface. He continues to have very wet macerated periwound callus on his heel. There is slough on the surface, but it is loose and thin. There is an area of undermining at the 11 o'clock position, but the overlying skin and subcutaneous tissue is healthy and viable. He has been approved for hyperbaric oxygen therapy and will start treatment tomorrow. 10/06/2022: Continued contraction and improvement of the dorsal foot wound. There is just a little bit of slough accumulation on the wound surface. The periwound callus continues to accumulate on the heel wound and it is  quite macerated. It is also persistently found with blue-green staining present. He did initiate his hyperbaric oxygen therapy, but has had significant difficulty with decompression. He will be going to ENT to have PE tubes placed. 10/18/2022: His hyperbaric oxygen therapy is on hold while his otological issues are being addressed. He came in again today with the periwound skin on both the dorsal aspect of his foot and his calcaneus completely macerated. The blue-green discoloration, however, has abated and the undermining on the calcaneus has improved. The dorsal foot wound has also contracted somewhat. [name], [name] ([id number]) [id number] Page 9 of 14 10/26/2022: The dorsal foot wound continues to contract and fill with good granulation tissue. The heel, once again, has a rim of macerated callus, but the undermining and tunneling continues to contract. He has completed his oral antibiotics and has been using the [facility] topical compounded antibiotic for his dressing changes at home. He is scheduled to see ENT this afternoon. 11/08/2022: The dorsal foot wound is flush with the surrounding skin and has a good granulation tissue surface. There is some slough accumulation. As usual, the heel has a rim of macerated callus but the dimensions are smaller and the tunneling and undermining have contracted further. He has resumed his hyperbaric oxygen therapy. 12/12; patient seen  for wound evaluation. He is tolerating HBO although we could not dive him yesterday because of relative hypoglycemia and the fact he had given himself NovoLog insulin before he came to clinic. Today his blood sugar is in the 180 range she should be fine. He has a large wound on the plantar calcaneus on the right and a more superficial area on the dorsal foot. He is using a scooter for offloading 11/30/2022: The dorsal foot wound continues to contract. There is some slough on the wound surface. The large  calcaneus wound has heaped up wet callus around the margins. Continued undermining. 12/08/2022: The dorsal foot wound is flat and flush with the surrounding skin surface. There is some slough present. Once again, the calcaneal wound has thick, absolutely macerated callus hanging off in tatters around the edges. The patient cannot explain to me why this part of his wound gets so wet. There is slough on the surface. The undermined portion of the wound has filled in. 12/17/2022: Earlier this week, he presented for his hyperbaric oxygen therapy and was hypotensive and ill-appearing. He also had what appeared to be an infected sebaceous cyst on his right upper arm. He was sent to the emergency department. He was given a fluid bolus and the ED provider lanced the cyst. He was prescribed doxycycline. He seems to be feeling better today. He notes that he has had copious drainage from his foot. On further questioning, he states that he has not been taking his oral diuretic. The dorsal foot wound has expanded somewhat, but is actually more superficial. The plantar heel wound is about the same. Patient History Family History Cancer - Father,Mother, Diabetes - Mother,Father, Lung Disease - Father, Thyroid Problems - Mother, No family history of Heart Disease, Hereditary Spherocytosis, Hypertension, Kidney Disease, Seizures, Stroke, Tuberculosis. Social History Never smoker, Marital Status - Married, Alcohol Use - Never, Drug Use - No History, Caffeine Use - Daily. Medical History Eyes Patient has history of Cataracts Denies history of Glaucoma, Optic Neuritis Ear/Nose/Mouth/Throat Denies history of Chronic sinus problems/congestion, Middle ear problems Respiratory Patient has history of Sleep Apnea Cardiovascular Patient has history of Hypertension Gastrointestinal Denies history of Cirrhosis , Colitis, Crohnoos, Hepatitis A, Hepatitis B, Hepatitis C Endocrine Patient has history of Type II  Diabetes Immunological Denies history of Lupus Erythematosus, Raynaudoos, Scleroderma Musculoskeletal Patient has history of Osteomyelitis - 2023 Neurologic Patient has history of Neuropathy - Bila lower extremities Oncologic Denies history of Received Chemotherapy, Received Radiation Psychiatric Denies history of Anorexia/bulimia, Confinement Anxiety Hospitalization/Surgery History - I and D Left calcaneus. - back surgery- laminectomy. - eye surgery- Bila cataracts. - shoulder arthroscopy. Medical A Surgical History Notes nd Cardiovascular hyperlipidemia Genitourinary AKI Objective Constitutional Hypertensive, asymptomatic. no acute distress. Vitals Time Taken: 9:28 AM, Height: 74 in, Weight: 245 lbs, BMI: 31.5, Temperature: 98.1 F, Pulse: 86 bpm, Respiratory Rate: 18 breaths/min, Blood Pressure: 163/79 mmHg. Respiratory Normal work of breathing on room air. [name], [name] ([id number]) [id number] Page 10 of 14 General Notes: 12/17/2022: He has had copious drainage from his foot. On further questioning, he states that he has not been taking his oral diuretic. The dorsal foot wound has expanded somewhat, but is actually more superficial. The plantar heel wound is about the same. There is a tiny stab incision on his right upper arm with purulent material draining from it. Integumentary (Hair, Skin) Wound #1 status is Open. Original cause of wound was Pressure Injury. The date acquired was: 03/09/2019. The wound has been in  treatment 14 weeks. The wound is located on the Left Calcaneus. The wound measures 5.6cm length x 4.5cm width x 0.9cm depth; 19.792cm^2 area and 17.813cm^3 volume. There is Fat Layer (Subcutaneous Tissue) exposed. There is no tunneling or undermining noted. There is a medium amount of serosanguineous drainage noted. The wound margin is distinct with the outline attached to the wound base. There is large (67-100%) red granulation within  the wound bed. There is a small (1-33%) amount of necrotic tissue within the wound bed including Adherent Slough. The periwound skin appearance had no abnormalities noted for color. The periwound skin appearance exhibited: Callus, Maceration. Periwound temperature was noted as No Abnormality. Wound #2 status is Open. Original cause of wound was Pressure Injury. The date acquired was: 07/09/2022. The wound has been in treatment 14 weeks. The wound is located on the Left,Dorsal Foot. The wound measures 5cm length x 4.1cm width x 0.1cm depth; 16.101cm^2 area and 1.61cm^3 volume. There is Fat Layer (Subcutaneous Tissue) exposed. There is no tunneling or undermining noted. There is a medium amount of serosanguineous drainage noted. The wound margin is distinct with the outline attached to the wound base. There is medium (34-66%) red granulation within the wound bed. There is a medium (34-66%) amount of necrotic tissue within the wound bed including Adherent Slough. The periwound skin appearance had no abnormalities noted for moisture. The periwound skin appearance had no abnormalities noted for color. The periwound skin appearance exhibited: Scarring. Periwound temperature was noted as No Abnormality. Wound #3 status is Open. Original cause of wound was Gradually Appeared. The date acquired was: 12/14/2022. The wound is located on the Right Shoulder. The wound measures 0.4cm length x 0.4cm width x 0.2cm depth; 0.126cm^2 area and 0.025cm^3 volume. There is Fat Layer (Subcutaneous Tissue) exposed. There is no tunneling or undermining noted. There is a medium amount of serosanguineous drainage noted. The wound margin is distinct with the outline attached to the wound base. There is large (67-100%) red granulation within the wound bed. There is no necrotic tissue within the wound bed. The periwound skin appearance had no abnormalities noted for texture. The periwound skin appearance had no abnormalities noted for  moisture. The periwound skin appearance exhibited: Erythema. The surrounding wound skin color is noted with erythema. Periwound temperature was noted as No Abnormality. 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 There was a Excisional Skin/Subcutaneous Tissue Debridement with a total area of 20 sq cm performed by Duanne Guessannon, Caedin Mogan, MD. With the following instrument(s): Curette to remove Non-Viable tissue/material. Material removed includes Subcutaneous Tissue and Slough and. No specimens were taken. A time out was conducted at 09:46, prior to the start of the procedure. A Minimum amount of bleeding was controlled with Pressure. The procedure was tolerated well. Post Debridement Measurements: 5.6cm length x 4.5cm width x 0.9cm depth; 17.813cm^3 volume. Character of Wound/Ulcer Post Debridement is improved. Severity of Tissue Post Debridement is: Fat layer exposed. Post procedure Diagnosis Wound #1: Same as Pre-Procedure General Notes: scribed for Dr. Lady Garyannon by Samuella Bruinaylor Herrington, RN. Wound #2 Pre-procedure diagnosis of Wound #  2 is a Diabetic Wound/Ulcer of the Lower Extremity located on the Left,Dorsal Foot .Severity of Tissue Pre Debridement is: Fat layer exposed. There was a Excisional Skin/Subcutaneous Tissue Debridement with a total area of 14 sq cm performed by [name], MD. With the following instrument(s): Curette to remove Non-Viable tissue/material. Material removed includes Eschar, Subcutaneous  Tissue, and Slough. No specimens were taken. A time out was conducted at 09:46, prior to the start of the procedure. A Minimum amount of bleeding was controlled with Pressure. The procedure was tolerated well. Post Debridement Measurements: 5cm length x 4.1cm width x 0.1cm depth; 1.61cm^3 volume. Character of Wound/Ulcer Post Debridement is improved. Severity of Tissue Post Debridement is: Fat layer exposed. Post procedure Diagnosis Wound #2: Same as Pre-Procedure General Notes: scribed for Dr. Lady Gary by Samuella Bruin, RN. Wound #3 Pre-procedure diagnosis of Wound #3 is a Cyst located on the Right Shoulder . Abscess incision and drainage was provided by [name], MD. The skin was cleansed and prepped with anti-septic followed by pain control using Lidocaine Injectable. An incision was made in the right shoulder with the following instrument(s): Blade. There was an immediate release of Purulent fluid. A Minimum amount of bleeding was controlled with Pressure. A time out was conducted at 09:55, prior to the start of the procedure. No culture was sent. The procedure was tolerated well with a pain level of 0 throughout and a pain level of 0 following the procedure. Post procedure Diagnosis Wound #3: Same as Pre-Procedure General Notes: scribed for Dr. Lady Gary by Samuella Bruin, RN. Plan Follow-up Appointments: Return Appointment in 1 week. - Dr. [place] 4 Anesthetic: [name], [name] ([id number]) [id number] Page 11 of 14 (In clinic) Xylocaine 1% injection applied to wound bed Cellular or Tissue Based Products: Other Cellular or Tissue Based Products Orders/Instructions: - run insurance for Apligraf Bathing/ Shower/ Hygiene: May shower and wash wound with soap and water. Negative Presssure Wound Therapy: Wound Vac to wound continuously at 155mm/hg pressure - to left calcaneus Black Foam Edema Control - Lymphedema / SCD / Other: Avoid standing for long  periods of time. Home Health: Admit to Home Health for skilled nursing wound care. May utilize formulary equivalent dressing for wound treatment orders unless otherwise specified. New wound care orders this week; continue Home Health for wound care. May utilize formulary equivalent dressing for wound treatment orders unless otherwise specified. Dressing changes to be completed by Home Health on Monday / Wednesday / Friday except when patient has scheduled visit at [facility]. Hyperbaric Oxygen Therapy: Wound #1 Left Calcaneus: Evaluate for HBO Therapy Indication: - chronic refractory osteomyelitis If appropriate for treatment, begin HBOT per protocol: 2.5 ATA for 90 Minutes with 2 Five (5) Minute Air Breaks T Number of Treatments: - 40 otal One treatments per day (delivered Monday through Friday unless otherwise specified in Special Instructions below): Finger stick Blood Glucose Pre- and Post- HBOT Treatment. Follow Hyperbaric Oxygen Glycemia Protocol Afrin (Oxymetazoline HCL) 0.05% nasal spray - 1 spray in both nostrils daily as needed prior to HBO treatment for difficulty clearing ears The following medication(s) was prescribed: Xylocaine injection 10 mg/mL (1 %) solution solution injection was prescribed at facility WOUND #1: - Calcaneus Wound Laterality: Left Cleanser: Normal Saline Discharge Instructions: Cleanse the wound with Normal Saline prior to applying a clean dressing using gauze sponges, not tissue or cotton balls. Cleanser: Soap and Water Discharge Instructions: May shower and wash wound with dial antibacterial soap and water prior to dressing change.  Peri-Wound Care: keystone Prim Dressing: [place] TransMontaigne, 4x4 (in/in) ary Discharge Instructions: Apply to wound bed as instructed Secondary Dressing: Woven Gauze Sponge, Non-Sterile 4x4 in Discharge Instructions: Apply over primary dressing as directed. Secondary Dressing: Zetuvit Plus 4x8 in Discharge  Instructions: Apply over primary dressing as directed. Secured With: Elastic Bandage 4 inch (ACE bandage) Discharge Instructions: Secure with ACE bandage as directed. Secured With: [employer], 4.5x3.1 (in/yd) Discharge Instructions: Secure with Kerlix as directed. WOUND #2: - Foot Wound Laterality: Dorsal, Left Cleanser: Normal Saline Discharge Instructions: Cleanse the wound with Normal Saline prior to applying a clean dressing using gauze sponges, not tissue or cotton balls. Cleanser: Soap and Water Discharge Instructions: May shower and wash wound with dial antibacterial soap and water prior to dressing change. Peri-Wound Care: keystone Prim Dressing: [place] [employer]e, 4x4 (in/in) ary Discharge Instructions: Apply to wound bed as instructed Secondary Dressing: Woven Gauze Sponge, Non-Sterile 4x4 in Discharge Instructions: Apply over primary dressing as directed. Secondary Dressing: Zetuvit Plus 4x8 in Discharge Instructions: Apply over primary dressing as directed. Secured With: Elastic Bandage 4 inch (ACE bandage) Discharge Instructions: Secure with ACE bandage as directed. Secured With: [employer], 4.5x3.1 (in/yd) Discharge Instructions: Secure with Kerlix as directed. WOUND #3: - Shoulder Wound Laterality: Right Cleanser: Soap and Water 1 x Per Day/30 Days Discharge Instructions: May shower and wash wound with dial antibacterial soap and water prior to dressing change. Cleanser: Wound Cleanser 1 x Per Day/30 Days Discharge Instructions: Cleanse the wound with wound cleanser prior to applying a clean dressing using gauze sponges, not tissue or cotton balls. Peri-Wound Care: Skin Prep 1 x Per Day/30 Days Discharge Instructions: Use skin prep as directed Prim Dressing: Iodoform packing strip 1/4 (in) 1 x Per Day/30 Days ary Discharge Instructions: Lightly pack as instructed Secondary Dressing: Zetuvit Plus Silicone Border Dressing 4x4 (in/in) 1 x Per Day/30  Days Discharge Instructions: Apply silicone border over primary dressing as directed. 12/17/2022: He has had copious drainage from his foot. On further questioning, he states that he has not been taking his oral diuretic. The dorsal foot wound has expanded somewhat, but is actually more superficial. The plantar heel wound is about the same. There is a tiny stab incision on his right upper arm with purulent material draining from it. I used a curette to debride slough and nonviable subcutaneous tissue from both the dorsal and plantar foot wounds. We are working on getting a wound VAC for the heel wound. We are going to run his insurance for Apligraf for the dorsal foot wound. In the meantime, we will continue to use Keystone topical antimicrobial compound with silver alginate to both sites. Given the inadequate drainage of the infected sebaceous cyst on his arm, I elected to perform a more thorough incision and drainage. After numbing the area with 1% lidocaine with epinephrine, I made a cruciate incision and drained additional purulent material. I then used a curette to debride out the contents of the cyst, as much as I could given the ongoing inflammation, we are going to pack this site with iodoform packing strips. He should complete the course of oral doxycycline that was prescribed by the ED. He will follow-up in 1 week. [name], [name] ([id number]) [id number] Page 12 of 14 Electronic Signature(s) Signed: 12/17/2022 11:13:20 AM By: [name] MD FACS Entered By: [name] on 12/17/2022 11:13:20 -------------------------------------------------------------------------------- HxROS Details Patient Name: Date of Service: [name] Blazing, [name]. 12/17/2022 9:15 A M Medical Record Number:  [id number] Patient Account Number: [id number] Date of Birth/Sex: Treating RN: 1956-01-27 (67 y.o. M) Primary Care Provider: Foye [name] Other Clinician: Referring  Provider: Treating Provider/Extender: Kandis Cocking in Treatment: 14 Eyes Medical History: Positive for: Cataracts Negative for: Glaucoma; Optic Neuritis Ear/Nose/Mouth/Throat Medical History: Negative for: Chronic sinus problems/congestion; Middle ear problems Respiratory Medical History: Positive for: Sleep Apnea Cardiovascular Medical History: Positive for: Hypertension Past Medical History Notes: hyperlipidemia Gastrointestinal Medical History: Negative for: Cirrhosis ; Colitis; Crohns; Hepatitis A; Hepatitis B; Hepatitis C Endocrine Medical History: Positive for: Type II Diabetes Time with diabetes: 20 years Treated with: Insulin Blood sugar tested every day: Yes Tested : 2-3 Genitourinary Medical History: Past Medical History Notes: AKI Immunological Medical History: Negative for: Lupus Erythematosus; Raynauds; Scleroderma Musculoskeletal Medical History: Positive for: Osteomyelitis - 2023 Neurologic Medical History: Positive for: Neuropathy - Bila lower extremities [name], [name] ([id number]) [id number] Page 13 of 14 Oncologic Medical History: Negative for: Received Chemotherapy; Received Radiation Psychiatric Medical History: Negative for: Anorexia/bulimia; Confinement Anxiety HBO Extended History Items Eyes: Cataracts Immunizations Pneumococcal Vaccine: Received Pneumococcal Vaccination: Yes Received Pneumococcal Vaccination On or After 60th Birthday: Yes Implantable Devices Yes Hospitalization / Surgery History Type of Hospitalization/Surgery I and D Left calcaneus back surgery- laminectomy eye surgery- Bila cataracts shoulder arthroscopy Family and Social History Cancer: Yes - Father,Mother; Diabetes: Yes - Mother,Father; Heart Disease: No; Hereditary Spherocytosis: No; Hypertension: No; Kidney Disease: No; Lung Disease: Yes - Father; Seizures: No; Stroke: No; Thyroid Problems: Yes -  Mother; Tuberculosis: No; Never smoker; Marital Status - Married; Alcohol Use: Never; Drug Use: No History; Caffeine Use: Daily; Financial Concerns: No; Food, Clothing or Shelter Needs: No; Support System Lacking: No; Transportation Concerns: No Electronic Signature(s) Signed: 12/17/2022 1:30:14 PM By: [name] MD FACS Entered By: [name] on 12/17/2022 11:01:20 -------------------------------------------------------------------------------- SuperBill Details Patient Name: Date of Service: [name] Blazing, [name]. 12/17/2022 Medical Record Number: [id number] Patient Account Number: [id number] Date of Birth/Sex: Treating RN: 1956-05-31 (67 y.o. 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.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 L72.3 Sebaceous cyst J50.093 Other chronic osteomyelitis, left ankle and foot E11.65 Type 2 diabetes mellitus with hyperglycemia E11.621 Type 2 diabetes mellitus with foot ulcer Facility Procedures : [name] Blazing, [name] The patient participates with Medicare or their insurance follows the Medicare Facility Guidelines: CPT4 Code Description Modifier Quantity ONALD [name] ([id number]) [id number] Page 14 of 14 [id number] 11042 - DEB SUBQ TISSUE 20 SQ  CM/< 1 ICD-10 Diagnosis Description 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 : The patient participates with Medicare or their insurance follows the Medicare Facility Guidelines: [id number] 11045 - DEB SUBQ TISS EA ADDL 20CM 1 ICD-10 Diagnosis Description 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 : The patient participates with Medicare or their insurance follows the Medicare Facility Guidelines: [id number] 10060 - I and D Abscess; simple/single 1 ICD-10 Diagnosis Description L72.3 Sebaceous cyst Physician Procedures : CPT4 Code Description Modifier [id number] - WC PHYS LEVEL 4 - EST PT 25 ICD-10 Diagnosis Description 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 L97.523 Non-pressure chronic ulcer of other part of left foot with  necrosis of muscle L72.3 Sebaceous cyst E11.621 Type 2 diabetes mellitus with foot ulcer Quantity: 1 : [id number] 11042 - WC PHYS SUBQ TISS 20 SQ CM ICD-10 Diagnosis Description 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

## 2022-12-19 LAB — CULTURE, BLOOD (ROUTINE X 2)
Culture: NO GROWTH
Special Requests: ADEQUATE

## 2022-12-20 ENCOUNTER — Encounter (HOSPITAL_BASED_OUTPATIENT_CLINIC_OR_DEPARTMENT_OTHER): Payer: Medicare Other | Admitting: General Surgery

## 2022-12-20 DIAGNOSIS — E11621 Type 2 diabetes mellitus with foot ulcer: Secondary | ICD-10-CM | POA: Diagnosis not present

## 2022-12-20 LAB — GLUCOSE, CAPILLARY
Glucose-Capillary: 192 mg/dL — ABNORMAL HIGH (ref 70–99)
Glucose-Capillary: 124 mg/dL — ABNORMAL HIGH (ref 70–99)
Glucose-Capillary: 119 mg/dL — ABNORMAL HIGH (ref 70–99)
Glucose-Capillary: 123 mg/dL — ABNORMAL HIGH (ref 70–99)
Glucose-Capillary: 209 mg/dL — ABNORMAL HIGH (ref 70–99)

## 2022-12-20 NOTE — Progress Notes (Signed)
MAKOA, SATZ R ([id number]) [id number] Page 1 of 2 Visit Report for 12/20/2022 Arrival Information Details Patient Name: Date of Service: [place], [name] 12/20/2022 10:00 A M Medical Record Number: [id number] Patient Account Number: [id number] Date of Birth/Sex: Treating RN: 10/09/1956 (67 y.o. Jimmey Ralph, Jamie Primary Care Seabron Iannello: Nelda Bucks Other Clinician: Valeria Batman Referring Blaklee Shores: Treating Jwan Hornbaker/Extender: Doristine Bosworth in Treatment: 36 Visit Information History Since Last Visit All ordered tests and consults were completed: Yes Patient Arrived: Knee Scooter Added or deleted any medications: No Arrival Time: 10:05 Any new allergies or adverse reactions: No Accompanied By: None Had a fall or experienced change in No Transfer Assistance: None activities of daily living that may affect Patient Identification Verified: Yes risk of falls: Secondary Verification Process Completed: Yes Signs or symptoms of abuse/neglect since last visito No Patient Requires Transmission-Based Precautions: No Hospitalized since last visit: No Patient Has Alerts: No Implantable device outside of the clinic excluding No cellular tissue based products placed in the center since last visit: Pain Present Now: No Electronic Signature(s) Signed: 12/20/2022 3:47:17 PM By: Valeria Batman EMT Entered By: Valeria Batman on 12/20/2022 15:47:16 -------------------------------------------------------------------------------- Encounter Discharge Information Details Patient Name: Date of Service: [place], [name] 12/20/2022 10:00 A M Medical Record Number: [id number] Patient Account Number: [id number] Date of Birth/Sex: Treating RN: [birth year] (67 y.o. [name] Primary Care Anchor Dwan: Nelda Bucks Other Clinician: Valeria Batman Referring Whitley Patchen: Treating Smayan Hackbart/Extender: Doristine Bosworth in Treatment:  14 Encounter Discharge Information Items Discharge Condition: Stable Ambulatory Status: Knee Scooter Discharge Destination: Home Transportation: Private Auto Accompanied By: None Schedule Follow-up Appointment: Yes Clinical Summary of Care: Electronic Signature(s) Signed: 12/20/2022 4:02:55 PM By: Valeria Batman EMT Entered By: Valeria Batman on 12/20/2022 16:02:55 [name] ([id number]) [id number] Page 2 of 2 -------------------------------------------------------------------------------- Vitals Details Patient Name: Date of Service: [place], [name] 12/20/2022 10:00 A M Medical Record Number: [id number] Patient Account Number: [id number] Date of Birth/Sex: Treating RN: Nov 24, 1956 (67 y.o. [name] Primary Care Mykenzie Ebanks: Nelda Bucks Other Clinician: Valeria Batman Referring Chisum Habenicht: Treating Shanigua Gibb/Extender: Doristine Bosworth in Treatment: 14 Vital Signs Time Taken: 10:12 Capillary Blood Glucose (mg/dl): 124 Height (in): 74 Reference Range: 80 - 120 mg / dl Weight (lbs): 245 Body Mass Index (BMI): 31.5 Electronic Signature(s) Signed: 12/20/2022 3:47:33 PM By: Valeria Batman EMT Entered By: Valeria Batman on 12/20/2022 15:47:33

## 2022-12-21 ENCOUNTER — Encounter (HOSPITAL_BASED_OUTPATIENT_CLINIC_OR_DEPARTMENT_OTHER): Payer: Medicare Other | Admitting: General Surgery

## 2022-12-21 NOTE — Progress Notes (Signed)
[name], MYHAND R ([id number]) [id number] Page 1 of 2 Visit Report for 12/20/2022 HBO Details Patient Name: Date of Service: [place], [name] 12/20/2022 10:00 A M Medical Record Number: [id number] Patient Account Number: [id number] Date of Birth/Sex: Treating RN: Jan 13, 1956 (67 y.o. 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 HBO Treatment Details Treatment Number: 29 Patient Type: Outpatient Chamber Type: Monoplace Chamber Serial #: [id number] Treatment Protocol: 2.0 ATA with 90 minutes oxygen, with two 5 minute air breaks Treatment Details Compression Rate Down: 1.5 psi / minute De-Compression Rate Up: 2.0 psi / minute A breaks and breathing ir Compress Tx Pressure periods Decompress Decompress Begins Reached (leave unused spaces Begins Ends blank) Chamber Pressure (ATA 1 2 2 2 2 2  --2 1 ) Clock Time (24 hr) 11:45 11:58 12:28 12:33 13:04 13:09 - - 13:39 13:50 Treatment Length: 125 (minutes) Treatment Segments: 4 Vital Signs Capillary Blood Glucose Reference Range: 80 - 120 mg / dl HBO Diabetic Blood Glucose Intervention Range: <131 mg/dl or mg/dl Type: Time Vitals Blood Pulse: Respiratory Temperature: Capillary Blood Glucose Pulse Action Taken: Pressure: Rate: Glucose (mg/dl): Meter #: Oximetry (%) Taken: Pre 10:12 124 Patient given 8 oz glucerna Post 13:54 129/66 72 18 97.2 209 Pre 10:42 109/69 79 18 98.1 Pre 10:45 119 Dr. >256 informed of blood sugars. Pre 11:34 123 Dr. Lady Gary informed of blood sugar Treatment Response Treatment Toleration: Well Treatment Completion Status:  Treatment Completed without Adverse Event Treatment Notes The patient stated that he took " 60 units of long acting and 12 units of short acting" insulin last night around 0035. At 1012 Blood sugar was 124 the patient was given 8oz of Glucerna to drink. At 1045 blood sugar rechecked 119. Spoke with Dr. Lady Gary. Dr. Lady Gary stated to give him something to eat. The patient was given 2 pack of Graham crackers, 2 pack of Peanut butter with 4 oz of Orange juice. At 1134 blood sugar rechecked was 123. Spoke with Dr. Lady Gary. Dr. Lady Gary stated to give him orange juice to with him during treatment. The patient was given 8 oz of orange juice. Treatment started. No problems noted during treatment. Physician HBO Attestation: I certify that I supervised this HBO treatment in accordance with Medicare guidelines. A trained emergency response team is readily available per Yes hospital policies and procedures. Continue HBOT as ordered. Yes Electronic Signature(s) Signed: 12/20/2022 5:12:34 PM By: 02/18/2023 MD FACS Previous Signature: 12/20/2022 4:01:46 PM Version By: 02/18/2023 EMT [name] R (Nona [id number] Page 2 of 2 Entered By: ) [id number] on 12/20/2022 17:12:33 -------------------------------------------------------------------------------- HBO Safety Checklist Details Patient Name: Date of Service: [place], [name] LD R. 12/20/2022 10:00 A M Medical Record Number: 02/18/2023 Patient Account Number: [id number] Date of Birth/Sex: Treating RN: May 25, 1956 (67 y.o. 79 Primary Care Detrell Umscheid: [name] Other Clinician: Foye [name] Referring Annell Canty: Treating Rio Taber/Extender: [name] in Treatment: 14 HBO Safety Checklist Items Safety Checklist Consent Form Signed Patient voided / foley secured and emptied When did you last eato last night Last dose of injectable or oral agent 0035 last night Ostomy  pouch emptied and vented if applicable NA All implantable devices assessed, documented and approved NA Intravenous access site secured and  place NA Valuables secured Linens and cotton and cotton/polyester blend (less than 51% polyester) Personal oil-based products / skin lotions / body lotions removed Wigs or hairpieces removed NA Smoking or tobacco materials removed Books / newspapers / magazines / loose paper removed Cologne, aftershave, perfume and deodorant removed Jewelry removed (may wrap wedding band) Make-up removed NA Hair care products removed Battery operated devices (external) removed Heating patches and chemical warmers removed Titanium eyewear removed NA Nail polish cured greater than 10 hours NA Casting material cured greater than 10 hours NA Hearing aids removed NA Loose dentures or partials removed removed by patient Prosthetics have been removed NA Patient demonstrates correct use of air break device (if applicable) Patient concerns have been addressed Patient grounding bracelet on and cord attached to chamber Specifics for Inpatients (complete in addition to above) Medication sheet sent with patient NA Intravenous medications needed or due during therapy sent with patient NA Drainage tubes (e.g. nasogastric tube or chest tube secured and vented) NA Endotracheal or Tracheotomy tube secured NA Cuff deflated of air and inflated with saline NA Airway suctioned NA Notes The safety checklist was sone before the treatment was started. Electronic Signature(s) Signed: 12/20/2022 3:49:18 PM By: Valeria Batman EMT Entered By: Valeria Batman on 12/20/2022 15:49:17

## 2022-12-21 NOTE — Progress Notes (Signed)
PERLE, GIBBON ([id number]) [id number] Page 1 of 2 Visit Report for 12/20/2022 Problem List Details Patient Name: Date of Service: [place], [name] 12/20/2022 10:00 A M Medical Record Number: [id number] Patient Account Number: [id number] Date of Birth/Sex: Treating RN: November 02, 1956 (67 y.o. 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 12/20/2022 Medical Record Number: [id number] Patient Account Number: [id number] Date of Birth/Sex: Treating RN: 02/08/1956 (67 y.o. [name] Primary Care Provider: Nelda Bucks Other Clinician: Valeria Batman Referring Provider: Treating Provider/Extender: Johnnette Gourd [place], [place] ([id number]) [id number] Page 2 of 2 Weeks in Treatment: 14 Diagnosis Coding ICD-10 Codes Code Description L97.424 Non-pressure chronic ulcer of left heel and  midfoot with necrosis of bone L97.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 L72.3 Sebaceous cyst 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, left ankle and foot E11.65 Type 2 diabetes mellitus with hyperglycemia E11.621 Type 2 diabetes mellitus with foot ulcer Facility Procedures : The patient participates with Medicare or their insurance follows the Medicare Facility Guidelines: CPT4 Code Description Modifier Quantity [phone] G0277-(Facility Use Only) HBOT full body chamber, 66min , 4 ICD-10 Diagnosis Description [phone] Other  chronic osteomyelitis, 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 2 diabetes mellitus with foot  ulcer Physician Procedures : CPT4 Code Description Modifier [id number] 19379 - WC PHYS HYPERBARIC OXYGEN THERAPY ICD-10 Diagnosis Description 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, left ankle and foot L97.424 Non-pressure chronic ulcer of left heel and midfoot with necrosis of bo  L97.523 Non-pressure chronic ulcer of other part of left foot with necrosis of E11.621 Type 2 diabetes mellitus with foot ulcer Quantity: 1 ne muscle Electronic Signature(s) Signed: 12/20/2022 4:02:21 PM By: Valeria Batman EMT Signed: 12/20/2022 5:11:08 PM By: Fredirick Maudlin MD FACS Entered By: Valeria Batman on 12/20/2022 16:02:19

## 2022-12-22 ENCOUNTER — Encounter (HOSPITAL_BASED_OUTPATIENT_CLINIC_OR_DEPARTMENT_OTHER): Payer: Medicare Other | Admitting: General Surgery

## 2022-12-23 ENCOUNTER — Encounter (HOSPITAL_BASED_OUTPATIENT_CLINIC_OR_DEPARTMENT_OTHER): Payer: Medicare Other | Admitting: General Surgery

## 2022-12-23 DIAGNOSIS — E11621 Type 2 diabetes mellitus with foot ulcer: Secondary | ICD-10-CM | POA: Diagnosis not present

## 2022-12-23 LAB — GLUCOSE, CAPILLARY
Glucose-Capillary: 174 mg/dL — ABNORMAL HIGH (ref 70–99)
Glucose-Capillary: 157 mg/dL — ABNORMAL HIGH (ref 70–99)

## 2022-12-23 NOTE — Progress Notes (Signed)
[name], [name] ([id number]) [id number] Page 1 of 2 Visit Report for 12/23/2022 HBO Details Patient Name: Date of Service: [place], [place] [name]. 12/23/2022 10:00 A M Medical Record Number: [id number] Patient Account Number: [id number] Date of Birth/Sex: Treating RN: October 04, 1956 (67 y.o. 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 HBO Treatment Details Treatment Number: 30 Patient Type: Outpatient Chamber Type: Monoplace Chamber Serial #: [id number] Treatment Protocol: 2.0 ATA with 90 minutes oxygen, with two 5 minute air breaks Treatment Details Compression Rate Down: 2.0 psi / minute De-Compression Rate Up: 2.0 psi / minute A breaks and breathing ir Compress Tx Pressure periods Decompress Decompress Begins Reached (leave unused spaces Begins Ends blank) Chamber Pressure (ATA 1 2 2 2 2 2  --2 1 ) Clock Time (24 hr) 10:44 10:56 11:26 11:31 12:01 12:06 - - 12:36 12:47 Treatment Length: 123 (minutes) Treatment Segments: 4 Vital Signs Capillary Blood Glucose Reference Range: 80 - 120 mg / dl HBO Diabetic Blood Glucose Intervention Range: <131 mg/dl or >249 mg/dl Time Vitals Blood Respiratory Capillary Blood Glucose Pulse Action Type: Pulse: Temperature: Taken: Pressure: Rate: Glucose (mg/dl): Meter #: Oximetry (%) Taken: Pre 10:17 174 Post 12:51 130/70 76 20 97.5 157 Pre 10:37 120/63 81 20 98.5 Treatment Response Treatment Toleration: Well Treatment Completion Status: Treatment Completed without Adverse Event Physician HBO Attestation: I certify that I supervised this HBO treatment in  accordance with Medicare guidelines. A trained emergency response team is readily available per Yes hospital policies and procedures. Continue HBOT as ordered. Yes Electronic Signature(s) Signed: 12/23/2022 3:12:41 PM By: Fredirick Maudlin MD FACS Previous Signature: 12/23/2022 2:15:32 PM Version By: Valeria Batman EMT Entered By: Fredirick Maudlin on 12/23/2022 15:12:40 [name] ([id number]) [id number] Page 2 of 2 -------------------------------------------------------------------------------- HBO Safety Checklist Details Patient Name: Date of Service: [place], [place] [name]. 12/23/2022 10:00 A M Medical Record Number: [id number] Patient Account Number: [id number] Date of Birth/Sex: Treating RN: 1956/03/31 (67 y.o. [name], Meta.Reding Primary Care Marcia Hartwell: Nelda Bucks Other Clinician: Valeria Batman Referring Gwenneth Whiteman: Treating Krishiv Sandler/Extender: Doristine Bosworth in Treatment: 14 HBO Safety Checklist Items Safety Checklist Consent Form Signed Patient voided / foley secured and emptied When did you last eato 0910 Last dose of injectable or oral agent Last night Ostomy pouch emptied and vented if applicable NA All implantable devices assessed, documented and approved Tympanostomy tubes Intravenous access site secured and place NA Valuables secured Linens and cotton and cotton/polyester blend (less than 51% polyester) Personal oil-based products / skin lotions / body lotions removed Wigs or hairpieces removed NA Smoking or tobacco materials removed Books / newspapers / magazines / loose paper removed Cologne, aftershave, perfume and deodorant removed Jewelry removed (may wrap wedding band) Make-up removed NA Hair care products removed Battery operated devices (external) removed Heating patches and chemical warmers removed Titanium eyewear removed NA Nail polish cured greater than 10 hours NA Casting material cured greater  than 10 hours NA Hearing aids removed NA Loose dentures or partials removed removed by patient Prosthetics have been removed NA Patient demonstrates correct use of air break device (if applicable) Patient concerns have been addressed Patient grounding bracelet on and cord attached to chamber Specifics for Inpatients (complete in addition to above) Medication  sheet sent with patient NA Intravenous medications needed or due during therapy sent with patient NA Drainage tubes (e.g. nasogastric tube or chest tube secured and vented) NA Endotracheal or Tracheotomy tube secured NA Cuff deflated of air and inflated with saline NA Airway suctioned NA Notes The safety checklist was sone before the treatment was started. Patient stated that he had Sausage [place] before coming in for treatment. Electronic Signature(s) Signed: 12/23/2022 2:13:54 PM By: Valeria Batman EMT Entered By: Valeria Batman on 12/23/2022 14:13:54

## 2022-12-23 NOTE — Progress Notes (Signed)
[name], [name] ([id number]) [id number] Page 1 of 2 Visit Report for 12/23/2022 Problem List Details Patient Name: Date of Service: [name], [name] 12/23/2022 10:00 A M Medical Record Number: [id number] Patient Account Number: [id number] Date of Birth/Sex: Treating RN: 26-Dec-1955 (67 y.o. [name], Meta.Reding Primary Care Provider: Nelda Bucks Other Clinician: Valeria Batman Referring Provider: Treating Provider/Extender: Doristine Bosworth in Treatment: 14 Active Problems ICD-10 Encounter Code Description Active Date MDM Diagnosis [phone] Non-pressure chronic ulcer of left heel and midfoot with 09/10/2022 No Yes necrosis of bone L97.523 Non-pressure chronic ulcer of other part of left foot with 09/10/2022 No Yes necrosis of muscle L72.3 Sebaceous cyst 12/17/2022 No Yes V76.160 Other chronic osteomyelitis, left ankle and foot 09/10/2022 No Yes E11.65 Type 2 diabetes mellitus with hyperglycemia 09/10/2022 No Yes E11.621 Type 2 diabetes mellitus with foot ulcer 09/10/2022 No Yes Inactive Problems Resolved Problems Electronic Signature(s) Signed: 12/23/2022 2:16:34 PM By: Valeria Batman EMT Signed: 12/23/2022 2:57:10 PM By: Fredirick Maudlin MD FACS Entered By: Valeria Batman on 12/23/2022 14:16:34 -------------------------------------------------------------------------------- SuperBill Details Patient Name: Date of Service: [name], [name] 12/23/2022 Medical Record Number: [id number] Patient Account Number: [id number] Date of Birth/Sex: Treating RN: 05-06-1956 (67 y.o. [name] Primary Care Provider: Nelda Bucks Other Clinician: Valeria Batman Referring Provider: Treating Provider/Extender: Johnnette Gourd [place], [place] ([id number]) [phone].pdf Page 2 of 2 Weeks in Treatment: 14 Diagnosis Coding ICD-10 Codes Code Description L97.424 Non-pressure chronic ulcer of left  heel and midfoot with necrosis of bone L97.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 L72.3 Sebaceous cyst 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, left ankle and foot E11.65 Type 2 diabetes mellitus with hyperglycemia E11.621 Type 2 diabetes mellitus with foot ulcer Facility Procedures : The patient participates with Medicare or their insurance follows the Medicare Facility Guidelines: CPT4 Code Description Modifier Quantity [id number] G0277-(Facility Use Only) HBOT full body chamber, 15min , 4 ICD-10 Diagnosis Description [phone] Other  chronic osteomyelitis, 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 2 diabetes mellitus with foot  ulcer Physician Procedures : CPT4 Code Description Modifier [id number] 86761 - WC PHYS HYPERBARIC OXYGEN THERAPY ICD-10 Diagnosis Description 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, left ankle and foot L97.424 Non-pressure chronic ulcer of left heel and midfoot with necrosis of bo  L97.523 Non-pressure chronic ulcer of other part of left foot with necrosis of E11.621 Type 2 diabetes mellitus with foot ulcer Quantity: 1 ne muscle Electronic Signature(s) Signed: 12/23/2022 2:16:05 PM By: Valeria Batman EMT Signed: 12/23/2022 2:57:10 PM By: Fredirick Maudlin MD FACS Entered By: Valeria Batman on 12/23/2022 14:16:04

## 2022-12-23 NOTE — Progress Notes (Signed)
ZAFIR, SCHAUER [name] ([id number]) [id number] Page 1 of 2 Visit Report for 12/23/2022 Arrival Information Details Patient Name: Date of Service: [place], [name] 12/23/2022 10:00 A M Medical Record Number: [id number] Patient Account Number: [id number] Date of Birth/Sex: Treating RN: [birth year] (67 y.o. [name], [name] Primary Care [name]: [name] Other Clinician: Valeria [name] Referring [name]: Treating [name]/Extender: [name] in Treatment: 36 Visit Information History Since Last Visit All ordered tests and consults were completed: Yes Patient Arrived: Knee Scooter Added or deleted any medications: No Arrival Time: 10:12 Any new allergies or adverse reactions: No Accompanied By: None Had a fall or experienced change in No Transfer Assistance: None activities of [name] living that may affect Patient Identification Verified: Yes risk of falls: Secondary Verification Process Completed: Yes Signs or symptoms of abuse/neglect since last visito No Patient Requires Transmission-Based Precautions: No Hospitalized since last visit: No Patient Has Alerts: No Implantable device outside of the clinic excluding No cellular tissue based products placed in the center since last visit: Pain Present Now: No Electronic Signature(s) Signed: 12/23/2022 2:11:33 PM By: [name] EMT Entered By: [name] on 12/23/2022 14:11:33 -------------------------------------------------------------------------------- Encounter Discharge Information Details Patient Name: Date of Service: [place], [name] 12/23/2022 10:00 A M Medical Record Number: [id number] Patient Account Number: [id number] Date of Birth/Sex: Treating RN: Oct 17, 1956 (67 y.o. [name], [name] Primary Care [name]: [name] Other Clinician: Valeria [name] Referring Myquan Schaumburg: Treating [name]/Extender: [name] in  Treatment: 14 Encounter Discharge Information Items Discharge Condition: Stable Ambulatory Status: Knee Scooter Discharge Destination: Home Transportation: Private Auto Accompanied By: None Schedule Follow-up Appointment: Yes Clinical Summary of Care: Electronic Signature(s) Signed: 12/23/2022 2:17:03 PM By: [name] EMT Entered By: [name] on 12/23/2022 14:17:03 [name] ([id number]) [id number] Page 2 of 2 -------------------------------------------------------------------------------- Vitals Details Patient Name: Date of Service: [name], [name] 12/23/2022 10:00 A M Medical Record Number: [id number] Patient Account Number: [id number] Date of Birth/Sex: Treating RN: December 03, 1956 (67 y.o. [name] Primary Care [name]: [name] Other Clinician: Valeria [name] Referring [name]: Treating [name]/Extender: [name] in Treatment: 14 Vital Signs Time Taken: 10:17 Capillary Blood Glucose (mg/dl): 174 Height (in): 74 Reference Range: 80 - 120 mg / dl Weight (lbs): 245 Body Mass Index (BMI): 31.5 Electronic Signature(s) Signed: 12/23/2022 2:11:54 PM By: [name] EMT Entered By: [name] on 12/23/2022 14:11:53

## 2022-12-24 ENCOUNTER — Encounter (HOSPITAL_BASED_OUTPATIENT_CLINIC_OR_DEPARTMENT_OTHER): Payer: Medicare Other | Admitting: General Surgery

## 2022-12-27 ENCOUNTER — Encounter (HOSPITAL_BASED_OUTPATIENT_CLINIC_OR_DEPARTMENT_OTHER): Payer: Medicare Other | Admitting: General Surgery

## 2022-12-27 DIAGNOSIS — E11621 Type 2 diabetes mellitus with foot ulcer: Secondary | ICD-10-CM | POA: Diagnosis not present

## 2022-12-27 LAB — GLUCOSE, CAPILLARY
Glucose-Capillary: 206 mg/dL — ABNORMAL HIGH (ref 70–99)
Glucose-Capillary: 194 mg/dL — ABNORMAL HIGH (ref 70–99)
Glucose-Capillary: 233 mg/dL — ABNORMAL HIGH (ref 70–99)

## 2022-12-27 NOTE — Progress Notes (Addendum)
GIANFRANCO, ARAKI R ([id number]) [id number] Page 1 of 2 Visit Report for 12/27/2022 HBO Details Patient Name: Date of Service: [place], [name] 12/27/2022 10:00 A M Medical Record Number: [id number] Patient Account Number: [id number] Date of Birth/Sex: Treating RN: 1956-02-22 (67 y.o. 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 HBO Treatment Details Treatment Number: 31 Patient Type: Outpatient Chamber Type: Monoplace Chamber Serial #: [id number] Treatment Protocol: 2.0 ATA with 90 minutes oxygen, with two 5 minute air breaks Treatment Details Compression Rate Down: 1.5 psi / minute De-Compression Rate Up: A breaks and breathing ir Compress Tx Pressure periods Decompress Decompress Begins Reached (leave unused spaces Begins Ends blank) Chamber Pressure (ATA 1 2 2 2 2 2  --2 1 ) Clock Time (24 hr) 10:09 10:25 10:55 11:00 11:30 11:35 - - 12:05 12:15 Treatment Length: 126 (minutes) Treatment Segments: 4 Vital Signs Capillary Blood Glucose Reference Range: 80 - 120 mg / dl HBO Diabetic Blood Glucose Intervention Range: <131 mg/dl or >249 mg/dl Type: Time Vitals Blood Pulse: Respiratory Temperature: Capillary Blood Glucose Pulse Action Taken: Pressure: Rate: Glucose (mg/dl): Meter #: Oximetry (%) Taken: Pre 09:53 121/72 92 18 98.6 206 1 Remeasure based on stated history Post 12:19 129/69 89 18 98.1 233 1 Pre 09:50 194 Given 8 oz Glucerna Treatment Response Treatment Toleration: Well Treatment Completion Status: Treatment Completed without Adverse Event Treatment Notes Patient arrived,  glucose measured at 206 mg/dL. Patient stated that the last time he ate was 12/26/2022 at 1200. Patient stated that CBG was 261 at approximately 0700 at which time he self-administered 5 units insulin. Patient prepared for treatment. Based on past encounters with patient blood glucose was remeasured with result of 194 mg/dL. This information was relayed to Dr. Celine Ahr and patient was given 8 oz Glucerna also sent an emergency container of orange juice. Chamber pressurized at 1 psi/min until reaching 3.5 psig at which point rate set was increased to 1.5 psi/min after confirming no problems with ear equalization. Rate set was increased again at 9 psig to 2 psi/min and travelled at that rate until reaching treatment depth. Mr. Siwek tolerated treatment and decompression of chamber at 2 psi/min. Patient drank his orange juice during the decompression phase. Post treatment CBG was 233 mg/dL. Patient was stable upon discharge and has wound care appointment after treatment. Physician HBO Attestation: I certify that I supervised this HBO treatment in accordance with Medicare guidelines. A trained emergency response team is readily available per Yes hospital policies and procedures. Continue HBOT as ordered. Yes Electronic Signature(s) Signed: 12/27/2022 3:23:54 PM By: Fredirick Maudlin MD FACS Previous Signature: 12/27/2022 3:08:01 PM Version By: Donavan Burnet CHT EMT BS , , Entered By: Fredirick Maudlin on 12/27/2022 15:23:54 [name] ([id number]) [id number] Page 2 of 2 -------------------------------------------------------------------------------- HBO Safety Checklist Details Patient Name: Date of Service: [place], [name] 12/27/2022 10:00 A M Medical Record Number: [id number] Patient Account Number: [id number] Date of Birth/Sex: Treating RN: 08-27-1956 (67 y.o. Burnadette Pop, Lauren Primary Care Maleyah Evans: Nelda Bucks Other Clinician: Donavan Burnet Referring  Darra Rosa: Treating Marlan Steward/Extender: Doristine Bosworth in Treatment: 15 HBO Safety Checklist Items Safety Checklist Consent Form Signed Patient voided / foley secured and emptied When did you last eato  12 PM yesterday ( 12/26/22) Last dose of injectable or oral agent 0700 Ostomy pouch emptied and vented if applicable NA All implantable devices assessed, documented and approved NA Intravenous access site secured and place NA Valuables secured Linens and cotton and cotton/polyester blend (less than 51% polyester) Personal oil-based products / skin lotions / body lotions removed Wigs or hairpieces removed NA Smoking or tobacco materials removed NA Books / newspapers / magazines / loose paper removed Cologne, aftershave, perfume and deodorant removed Jewelry removed (may wrap wedding band) Make-up removed NA Hair care products removed Battery operated devices (external) removed Heating patches and chemical warmers removed Titanium eyewear removed Nail polish cured greater than 10 hours NA Casting material cured greater than 10 hours NA Hearing aids removed NA Loose dentures or partials removed dentures removed Prosthetics have been removed NA Patient demonstrates correct use of air break device (if applicable) Patient concerns have been addressed Patient grounding bracelet on and cord attached to chamber Specifics for Inpatients (complete in addition to above) Medication sheet sent with patient NA Intravenous medications needed or due during therapy sent with patient NA Drainage tubes (e.g. nasogastric tube or chest tube secured and vented) NA Endotracheal or Tracheotomy tube secured NA Cuff deflated of air and inflated with saline NA Airway suctioned NA Notes Paper version used prior to treatment. Electronic Signature(s) Signed: 12/27/2022 2:58:30 PM By: Donavan Burnet CHT EMT BS , , Entered By: Donavan Burnet on 12/27/2022  14:58:29

## 2022-12-27 NOTE — Progress Notes (Addendum)
SEYMORE, BRODOWSKI R ([id number]) [id number] Page 1 of 14 Visit Report for 12/27/2022 Chief Complaint Document Details Patient Name: Date of Service: [place], [name] 12/27/2022 12:30 PM Medical Record Number: [id number] Patient Account Number: [id number] Date of Birth/Sex: Treating RN: 04/22/1956 (67 y.o. M) Primary Care Provider: Nelda Bucks Other Clinician: Referring Provider: Treating Provider/Extender: Doristine Bosworth in Treatment: 15 Information Obtained from: Patient Chief Complaint Patients presents for treatment of an open diabetic ulcer and evaluation for hyperbaric oxygen therapy Electronic Signature(s) Signed: 12/27/2022 1:34:21 PM By: Fredirick Maudlin MD FACS Entered By: Fredirick Maudlin on 12/27/2022 13:34:20 -------------------------------------------------------------------------------- Debridement Details Patient Name: Date of Service: [name], [name] 12/27/2022 12:30 PM Medical Record Number: [id number] Patient Account Number: [id number] Date of Birth/Sex: Treating RN: 28-May-1956 (67 y.o. [name] Primary Care Provider: Nelda Bucks Other Clinician: Referring Provider: Treating Provider/Extender: Doristine Bosworth in Treatment: 15 Debridement Performed for Assessment: Wound #1 Left Calcaneus Performed By: Physician Fredirick Maudlin, MD Debridement Type: Debridement Severity of Tissue Pre Debridement: Necrosis of bone Level of Consciousness (Pre-procedure): Awake and Alert Pre-procedure Verification/Time Out Yes - 13:15 Taken: Start Time: 13:20 T Area Debrided (L x W): otal 6.2 (cm) x 4.4 (cm) = 27.28 (cm) Tissue and other material debrided: Non-Viable, Callus, Slough, Skin: Epidermis, Slough Level: Skin/Epidermis Debridement Description: Selective/Open Wound Instrument: Curette Bleeding: Minimum Hemostasis Achieved: Pressure Procedural Pain: 0 Post Procedural Pain:  0 Response to Treatment: Procedure was tolerated well Level of Consciousness (Post- Awake and Alert procedure): Post Debridement Measurements of Total Wound Length: (cm) 6.2 Width: (cm) 4.4 Depth: (cm) 1.6 Volume: (cm) 34.281 Character of Wound/Ulcer Post Debridement: Improved Severity of Tissue Post Debridement: Necrosis of bone Post Procedure Diagnosis ZAVIEN, CLUBB ([id number]) [id number] Page 2 of 14 Same as Pre-procedure Notes scribed for Dr. Celine Ahr by Baruch Gouty, RN Electronic Signature(s) Signed: 12/27/2022 1:50:49 PM By: Fredirick Maudlin MD FACS Signed: 12/27/2022 4:59:31 PM By: Baruch Gouty RN, BSN Entered By: Baruch Gouty on 12/27/2022 13:24:50 -------------------------------------------------------------------------------- Debridement Details Patient Name: Date of Service: [place], [name] 12/27/2022 12:30 PM Medical Record Number: [id number] Patient Account Number: [id number] Date of Birth/Sex: Treating RN: [birth year] (67 y.o. [name] Primary Care Provider: Nelda Bucks Other Clinician: Referring Provider: Treating Provider/Extender: Doristine Bosworth in Treatment: 15 Debridement Performed for Assessment: Wound #2 Left,Dorsal Foot Performed By: Physician Fredirick Maudlin, MD Debridement Type: Debridement Severity of Tissue Pre Debridement: Fat layer exposed Level of Consciousness (Pre-procedure): Awake and Alert Pre-procedure Verification/Time Out Yes - 13:15 Taken: Start Time: 13:20 T Area Debrided (L x W): otal 2.7 (cm) x 3.4 (cm) = 9.18 (cm) Tissue and other material debrided: Non-Viable, Slough, Slough Level: Non-Viable Tissue Debridement Description: Selective/Open Wound Instrument: Curette Bleeding: Minimum Hemostasis Achieved: Pressure Procedural Pain: 0 Post Procedural Pain: 0 Response to Treatment: Procedure was tolerated well Level of Consciousness (Post- Awake and  Alert procedure): Post Debridement Measurements of Total Wound Length: (cm) 2.7 Width: (cm) 3.4 Depth: (cm) 0.1 Volume: (cm) 0.721 Character of Wound/Ulcer Post Debridement: Improved Severity of Tissue Post Debridement: Fat layer exposed Post Procedure Diagnosis Same as Pre-procedure Notes scribed for Dr. Celine Ahr by Baruch Gouty, RN Electronic Signature(s) Signed: 12/27/2022 3:23:09 PM By: Fredirick Maudlin MD FACS Signed: 12/27/2022 4:59:31 PM By: Baruch Gouty RN, BSN Previous Signature: 12/27/2022 1:50:49 PM Version By: Fredirick Maudlin MD FACS Entered By: Baruch Gouty on 12/27/2022 15:17:59 GAVEN, EUGENE R ([id number]) [id number] Page 3 of 14 -------------------------------------------------------------------------------- Debridement Details Patient  Name: Date of Service: [place], [name] 12/27/2022 12:30 PM Medical Record Number: [id number] Patient Account Number: [id number] Date of Birth/Sex: Treating RN: 1956-09-27 (67 y.o. [name] Primary Care Provider: Nelda Bucks Other Clinician: Referring Provider: Treating Provider/Extender: Doristine Bosworth in Treatment: 15 Debridement Performed for Assessment: Wound #3 Right Shoulder Performed By: Physician Fredirick Maudlin, MD Debridement Type: Debridement Level of Consciousness (Pre-procedure): Awake and Alert Pre-procedure Verification/Time Out Yes - 13:15 Taken: Start Time: 13:20 T Area Debrided (L x W): otal 0.9 (cm) x 0.2 (cm) = 0.18 (cm) Tissue and other material debrided: Viable, Non-Viable, Subcutaneous, Other: cyst wall Level: Skin/Subcutaneous Tissue Debridement Description: Excisional Instrument: Curette Bleeding: Minimum Hemostasis Achieved: Pressure Procedural Pain: 0 Post Procedural Pain: 0 Response to Treatment: Procedure was tolerated well Level of Consciousness (Post- Awake and Alert procedure): Post Debridement Measurements of  Total Wound Length: (cm) 0.9 Width: (cm) 0.2 Depth: (cm) 1.7 Volume: (cm) 0.24 Character of Wound/Ulcer Post Debridement: Improved Post Procedure Diagnosis Same as Pre-procedure Notes scribed for Dr. Celine Ahr by Baruch Gouty, RN Electronic Signature(s) Signed: 12/27/2022 3:23:09 PM By: Fredirick Maudlin MD FACS Signed: 12/27/2022 4:59:31 PM By: Baruch Gouty RN, BSN Previous Signature: 12/27/2022 1:50:49 PM Version By: Fredirick Maudlin MD FACS Entered By: Baruch Gouty on 12/27/2022 15:18:10 -------------------------------------------------------------------------------- HPI Details Patient Name: Date of Service: [place], [name] 12/27/2022 12:30 PM Medical Record Number: [id number] Patient Account Number: [id number] Date of Birth/Sex: Treating RN: [birth year] (67 y.o. M) Primary Care Provider: Nelda Bucks Other Clinician: Referring Provider: Treating Provider/Extender: Doristine Bosworth in Treatment: 15 History of Present Illness HPI Description: ADMISSION 09/10/2022 This is a 67 year old poorly controlled type II diabetic (last hemoglobin A1c 10.8%) who has had an ulcer on his heel for over 3 years. He has been seen in multiple wound care centers, including [facility] and [facility]. He reports that at least 3 doctors have recommended that he undergo below-knee amputation. He most recently met with Dr. Catalina Gravel, a vascular surgeon affiliated with [facility]. 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 Unfortunately, the patient has some extenuating social circumstances including the fact that he cares for his wife who has stage IV colon cancer and still works, driving vehicles for [employer]. 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 He was recently hospitalized at [facility] for sepsis and currently has a PICC line through which he  receives IV antibiotics. He reports having had another MRI during that hospital stay along with a chest x-ray and EKG. He apparently contacted one of the hyperbaric EDRICK, WHITEHORN R ([id number]) [id number] Page 4 of 14 therapy techs here and asked a number of questions about hyperbaric oxygen treatments. He subsequently self-referred to our center to undergo further evaluation and management. I mention to him that [facility] actually has hyperbaric chambers, but he states that he lives in [place] and this would be more convenient for him given the intensive nature of the therapy and time requirement. ABI in clinic today was 0.94. The patient actually has 2 wounds. There is a wound on the dorsum of his left foot with heavy black eschar and slough present. After debridement, this was demonstrated to involve the muscle and the extensor tendons are exposed. On his heel, there is essentially a "shark bite" type wound, with much of the heel fat pad absent. The muscle layer is exposed. There  is blue-green staining around the perimeter of the wound, but no significant odor. He says he has been applying collagen to the wound on his heel and Silvadene and Betadine to the wound on his dorsal foot. 09/20/2022: The heel wound is quite macerated with wet periwound callus. There is slough accumulation on the surface. The dorsal foot wound looks better this week. There is still exposed tendon, but it is fairly clean with just a little biofilm buildup. We are still working on gathering the required documentation to submit for pretreatment review for hyperbaric oxygen therapy. 09/29/2022: The dorsal foot wound continues to improve. I do not see any exposed tendon at this point. There is just some slough accumulation on the wound surface. He continues to have very wet macerated periwound callus on his heel. There is slough on the surface, but it is loose and thin. There is an area  of undermining at the 11 o'clock position, but the overlying skin and subcutaneous tissue is healthy and viable. He has been approved for hyperbaric oxygen therapy and will start treatment tomorrow. 10/06/2022: Continued contraction and improvement of the dorsal foot wound. There is just a little bit of slough accumulation on the wound surface. The periwound callus continues to accumulate on the heel wound and it is quite macerated. It is also persistently found with blue-green staining present. He did initiate his hyperbaric oxygen therapy, but has had significant difficulty with decompression. He will be going to ENT to have PE tubes placed. 10/18/2022: His hyperbaric oxygen therapy is on hold while his otological issues are being addressed. He came in again today with the periwound skin on both the dorsal aspect of his foot and his calcaneus completely macerated. The blue-green discoloration, however, has abated and the undermining on the calcaneus has improved. The dorsal foot wound has also contracted somewhat. 10/26/2022: The dorsal foot wound continues to contract and fill with good granulation tissue. The heel, once again, has a rim of macerated callus, but the undermining and tunneling continues to contract. He has completed his oral antibiotics and has been using the [facility] topical compounded antibiotic for his dressing changes at home. He is scheduled to see ENT this afternoon. 11/08/2022: The dorsal foot wound is flush with the surrounding skin and has a good granulation tissue surface. There is some slough accumulation. As usual, the heel has a rim of macerated callus but the dimensions are smaller and the tunneling and undermining have contracted further. He has resumed his hyperbaric oxygen therapy. 12/12; patient seen for wound evaluation. He is tolerating HBO although we could not dive him yesterday because of relative hypoglycemia and the fact he had given himself NovoLog insulin  before he came to clinic. Today his blood sugar is in the 180 range she should be fine. He has a large wound on the plantar calcaneus on the right and a more superficial area on the dorsal foot. He is using a scooter for offloading 11/30/2022: The dorsal foot wound continues to contract. There is some slough on the wound surface. The large calcaneus wound has heaped up wet callus around the margins. Continued undermining. 12/08/2022: The dorsal foot wound is flat and flush with the surrounding skin surface. There is some slough present. Once again, the calcaneal wound has thick, absolutely macerated callus hanging off in tatters around the edges. The patient cannot explain to me why this part of his wound gets so wet. There is slough on the surface. The undermined portion of the wound  has filled in. 12/17/2022: Earlier this week, he presented for his hyperbaric oxygen therapy and was hypotensive and ill-appearing. He also had what appeared to be an infected sebaceous cyst on his right upper arm. He was sent to the emergency department. He was given a fluid bolus and the ED provider lanced the cyst. He was prescribed doxycycline. He seems to be feeling better today. He notes that he has had copious drainage from his foot. On further questioning, he states that he has not been taking his oral diuretic. The dorsal foot wound has expanded somewhat, but is actually more superficial. The plantar heel wound is about the same. 12/27/2022: The dorsal foot wound has epithelialized further. The plantar foot wound is about the same size but has heaped up macerated callus around the perimeter. The intake nurse noted a tiny fragment of bone on his dressings and there is now an area at about the 5 o'clock position where 1 can palpate bone through a small slit in the soft tissues. The wound on his arm is contracting but he still likely has cyst wall present due to the manner in which the infected sebaceous cyst was  addressed in the ER. Electronic Signature(s) Signed: 12/27/2022 1:37:19 PM By: Duanne Guessannon, Sissy Goetzke MD FACS Entered By: Duanne Guessannon, Ilana Prezioso on 12/27/2022 13:37:18 -------------------------------------------------------------------------------- Physical Exam Details Patient Name: Date of Service: [place], [name] 12/27/2022 12:30 PM Medical Record Number: [id number] Patient Account Number: [id number] Date of Birth/Sex: Treating RN: 05-30-1956 [age] y.o. M) Primary Care Provider: Foye DeerSchultz, Douglas Other Clinician: Referring Provider: Treating Provider/Extender: Kandis Cockingannon, Baneen Wieseler Schultz, Douglas Weeks in Treatment: 15 Constitutional no acute distress. Respiratory Normal work of breathing on room air. [name], Amareon R ([id number]) [id number] Page 5 of 14 Notes 12/27/2022: The dorsal foot wound has epithelialized further. The plantar foot wound is about the same size but has heaped up macerated callus around the perimeter. The intake nurse noted a tiny fragment of bone on his dressings and there is now an area at about the 5 o'clock position where one can palpate bone through a small slit in the soft tissues. The wound on his arm is contracting but he still likely has cyst wall present due to the manner in which the infected sebaceous cyst was addressed in the ER. Electronic Signature(s) Signed: 12/27/2022 1:38:00 PM By: Duanne Guessannon, Bransen Fassnacht MD FACS Entered By: Duanne Guessannon, Briceyda Abdullah on 12/27/2022 13:38:00 -------------------------------------------------------------------------------- Physician Orders Details Patient Name: [place]. 12/27/2022 12:30 PM Medical Record Number: [id number] Patient Account Number: [id number] Date of Birth/Sex: Treating RN: 05-30-1956 [age] y.o. 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 - Dr. Lady Garyannon Rm 1 Return A Monday 1/22 @ 0815 am Anesthetic (In clinic) Topical Lidocaine 5% applied to wound bed (In clinic) Xylocaine 1% injection applied to wound bed Cellular or Tissue Based Products Other Cellular or Tissue Based Products Orders/Instructions: - run insurance for Apligraf Bathing/ Shower/ Hygiene May shower with protection but do not get wound dressing(s) wet. Protect dressing(s) with  water repellant cover (for example, large plastic bag) or a cast cover and may then take shower. Negative Presssure Wound Therapy Wound Vac to wound continuously at 174mm/hg pressure - to left calcaneus Black Foam Edema Control - Lymphedema / SCD / Other Left Lower Extremity Avoid standing for long periods of time. Off-Loading Wound #1 Left Calcaneus Other: - use knee scooter to ambulate, minimal weight bearing right foot Home Health Admit to Home Health for skilled nursing wound care. May utilize formulary equivalent dressing for wound treatment orders unless otherwise specified. New wound care orders this week; continue Home Health for wound care. May utilize formulary equivalent dressing for wound treatment orders unless otherwise specified. Dressing changes to be completed by Home Health on Monday / Wednesday / Friday except when patient has scheduled visit at [facility]. Hyperbaric Oxygen Therapy Wound #1 Left Calcaneus FERD, HORRIGAN ([id number]) [id number] Page 6 of  14 Evaluate for HBO Therapy Indication: - chronic refractory osteomyelitis If appropriate for treatment, begin HBOT per protocol: 2.5 ATA for 90 Minutes with 2 Five (5) Minute A Breaks ir Total Number of Treatments: - 40 One treatments per day (delivered Monday through Friday unless otherwise specified in Special Instructions below): Finger stick Blood Glucose Pre- and Post- HBOT Treatment. Follow Hyperbaric Oxygen Glycemia Protocol A frin (Oxymetazoline HCL) 0.05% nasal spray - 1 spray in both nostrils daily as needed prior to HBO treatment for difficulty clearing ears Wound Treatment Wound #1 - Calcaneus Wound Laterality: Left Cleanser: Normal Saline 3 x Per Week/30 Days Discharge Instructions: Cleanse the wound with Normal Saline prior to applying a clean dressing using gauze sponges, not tissue or cotton balls. Cleanser: Soap and Water 3 x Per Week/30 Days Discharge Instructions: May shower and wash wound with dial antibacterial soap and water prior to dressing change. Peri-Wound Care: keystone 3 x Per Week/30 Days Secured With: Coban Self-Adherent Wrap 4x5 (in/yd) 3 x Per Week/30 Days Discharge Instructions: Secure with Coban as directed. Secured With: [employer], 4.5x3.1 (in/yd) 3 x Per Week/30 Days Discharge Instructions: Secure with Kerlix as directed. Wound #2 - Foot Wound Laterality: Dorsal, Left Cleanser: Normal Saline Discharge Instructions: Cleanse the wound with Normal Saline prior to applying a clean dressing using gauze sponges, not tissue or cotton balls. Cleanser: Soap and Water Discharge Instructions: May shower and wash wound with dial antibacterial soap and water prior to dressing change. Peri-Wound Care: keystone Prim Dressing: Promogran Prisma Matrix, 4.34 (sq in) (silver collagen) ary Discharge Instructions: Moisten collagen with hydrogel Secondary Dressing: Zetuvit Plus Silicone Border Dressing 4x4 (in/in) Discharge Instructions: Apply silicone border  over primary dressing as directed. Secured With: Coban Self-Adherent Wrap 4x5 (in/yd) Discharge Instructions: Secure with Coban as directed. Secured With: [employer], 4.5x3.1 (in/yd) Discharge Instructions: Secure with Kerlix as directed. Wound #3 - Shoulder Wound Laterality: Right Cleanser: Soap and Water 1 x Per Day/30 Days Discharge Instructions: May shower and wash wound with dial antibacterial soap and water prior to dressing change. Cleanser: Wound Cleanser 1 x Per Day/30 Days Discharge Instructions: Cleanse the wound with wound cleanser prior to applying a clean dressing using gauze sponges, not tissue or cotton balls. Peri-Wound Care: Skin Prep 1 x Per Day/30 Days Discharge Instructions: Use skin prep as directed Prim Dressing: Iodoform packing strip 1/4 (in) 1 x Per Day/30 Days ary Discharge Instructions: Lightly pack as instructed Secondary Dressing: Zetuvit Plus Silicone Border Dressing 4x4 (in/in) 1 x Per Day/30 Days Discharge Instructions: Apply silicone border over primary dressing  as directed. GLYCEMIA INTERVENTIONS PROTOCOL PRE-HBO GLYCEMIA INTERVENTIONS ACTION INTERVENTION Obtain pre-HBO capillary blood glucose (ensure 1 physician order is in chart). A. Notify HBO physician and await physician orders. 2 If result is 70 mg/dl or below: B. If the result meets the hospital definition of a critical result, follow hospital policy. A. Give patient an 8 ounce Glucerna Shake, an 8 ounce Ensure, or 8 ounces of a Glucerna/Ensure equivalent dietary supplement*. B. Wait 30 minutes. CONROY, GORACKE R ([id number]) [id number] Page 7 of 14 If result is 71 mg/dl to 829 mg/dl: C. Retest patients capillary blood glucose (CBG). D. If result greater than or equal to 110 mg/dl, proceed with HBO. If result less than 110 mg/dl, notify HBO physician and consider holding HBO. If result is 131 mg/dl to 562 mg/dl: A. Proceed with HBO. A. Notify HBO  physician and await physician orders. B. It is recommended to hold HBO and do If result is 250 mg/dl or greater: blood/urine ketone testing. C. If the result meets the hospital definition of a critical result, follow hospital policy. POST-HBO GLYCEMIA INTERVENTIONS ACTION INTERVENTION Obtain post HBO capillary blood glucose (ensure 1 physician order is in chart). A. Notify HBO physician and await physician orders. 2 If result is 70 mg/dl or below: B. If the result meets the hospital definition of a critical result, follow hospital policy. A. Give patient an 8 ounce Glucerna Shake, an 8 ounce Ensure, or 8 ounces of a Glucerna/Ensure equivalent dietary supplement*. B. Wait 15 minutes for symptoms of If result is 71 mg/dl to 130 mg/dl: hypoglycemia (i.e. nervousness, anxiety, sweating, chills, clamminess, irritability, confusion, tachycardia or dizziness). C. If patient asymptomatic, discharge patient. If patient symptomatic, repeat capillary blood glucose (CBG) and notify HBO physician. If result is 101 mg/dl to 865 mg/dl: A. Discharge patient. A. Notify HBO physician and await physician orders. B. It is recommended to do blood/urine ketone If result is 250 mg/dl or greater: testing. C. If the result meets the hospital definition of a critical result, follow hospital policy. *Juice or candies are NOT equivalent products. If patient refuses the Glucerna or Ensure, please consult the hospital dietitian for an appropriate substitute. Electronic Signature(s) Signed: 12/27/2022 3:23:09 PM By: [name] MD FACS Signed: 12/27/2022 4:59:31 PM By: Zenaida Deed RN, BSN Entered By: Zenaida Deed on 12/27/2022 14:49:39 -------------------------------------------------------------------------------- Problem List Details Patient Name: Date of Service: [place], [name] 12/27/2022 12:30 PM Medical Record Number: [id number] Patient Account Number: [id number] Date of  Birth/Sex: Treating RN: 18-May-1956 (67 y.o. 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 09/10/2022 No Yes L97.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 09/10/2022 No Yes L72.3 Sebaceous cyst 12/17/2022 No Yes RHYDER, KOEGEL ([id number]) [id number] Page 8 [phone] Other chronic osteomyelitis, left ankle and foot 09/10/2022 No Yes E11.65 Type 2 diabetes mellitus with hyperglycemia 09/10/2022 No Yes E11.621 Type 2 diabetes mellitus with foot ulcer 09/10/2022 No Yes Inactive Problems Resolved Problems Electronic Signature(s) Signed: 12/27/2022 1:29:15 PM By: [name] MD FACS Entered By: [name] on 12/27/2022 13:29:15 -------------------------------------------------------------------------------- Progress Note Details Patient Name: Date of Service: [name], [name] 12/27/2022 12:30 PM Medical Record Number: [id number] Patient Account Number: [id number] Date of Birth/Sex: Treating RN: 27-Aug-1956 (67 y.o. M)  Primary Care Provider: Foye DeerSchultz, Douglas Other Clinician: Referring Provider: Treating Provider/Extender: Kandis Cockingannon, Woodrow Dulski Schultz, Douglas Weeks in Treatment: 15 Subjective Chief Complaint Information obtained from Patient Patients presents for treatment of an open diabetic ulcer and evaluation for hyperbaric oxygen therapy History of Present Illness (HPI) ADMISSION 09/10/2022 This is a 67 year old poorly controlled type II diabetic (last hemoglobin A1c 10.8%) who has had an ulcer on his heel for over 3 years. He has been seen in multiple wound care centers, including [facility]. He reports that at least 3 doctors have recommended that he undergo below-knee amputation. He most recently met with Dr. Reuel BoomMatt Goldman, a vascular surgeon affiliated [facility]. 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 Unfortunately, the patient has some extenuating social circumstances including the fact that he cares for his wife who has stage IV colon cancer and still works, driving vehicles for [employer]. 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 He was recently hospitalized at [facility] for sepsis and currently has a PICC line through which he receives IV antibiotics. He reports having had another MRI during that hospital stay along with a chest x-ray and EKG. He apparently contacted one of the hyperbaric therapy techs here and asked a number of questions about hyperbaric oxygen treatments. He subsequently self-referred to our center to undergo further evaluation and management. I mention to him that [facility], but he states that he lives in [place] and this would be more convenient for him given the intensive nature of the therapy and time requirement. ABI in clinic today was 0.94. The patient actually has 2 wounds. There is a wound on the dorsum of his left foot with heavy black eschar and slough present. After debridement, this was demonstrated to involve the muscle and the extensor tendons are exposed. On his heel, there is essentially a "shark bite" type wound, with much of the heel fat pad absent. The muscle layer is exposed. There is blue-green staining around the perimeter of the wound, but no significant odor. He says he has been applying collagen to the wound on his heel and Silvadene and Betadine to the wound on his dorsal foot. 09/20/2022: The heel wound is quite macerated with wet periwound callus. There is slough accumulation on the  surface. The dorsal foot wound looks better this week. There is still exposed tendon, but it is fairly clean with just a little biofilm buildup. We are still working on gathering the required documentation to submit for pretreatment review for hyperbaric oxygen therapy. 09/29/2022: The dorsal foot wound continues to improve. I do not see any exposed tendon at this point. There is just some slough accumulation on the wound surface. He continues to have very wet macerated periwound callus on his heel. There is slough on the surface, but it is loose and thin. There is an area of undermining at the 11 o'clock position, but the overlying skin and subcutaneous tissue is healthy and viable. He has been approved for hyperbaric oxygen therapy and will start treatment tomorrow. 10/06/2022: Continued contraction and improvement of the dorsal foot wound. There is just a little bit of slough accumulation on the wound surface. The periwound callus continues to accumulate on the heel wound and it is quite macerated. It is also persistently found with blue-green staining present. He did initiate his hyperbaric oxygen therapy, but has had significant difficulty  with decompression. He will be going to ENT to have PE tubes placed. 10/18/2022: His hyperbaric oxygen therapy is on hold while his otological issues are being addressed. He came in again today with the periwound skin on both the dorsal aspect of his foot and his calcaneus completely macerated. The blue-green discoloration, however, has abated and the undermining on the calcaneus has improved. The dorsal foot wound has also contracted somewhat. [name], Shean R ([id number]) [id number] Page 9 of 14 10/26/2022: The dorsal foot wound continues to contract and fill with good granulation tissue. The heel, once again, has a rim of macerated callus, but the undermining and tunneling continues to contract. He has completed his oral antibiotics  and has been using the [facility] topical compounded antibiotic for his dressing changes at home. He is scheduled to see ENT this afternoon. 11/08/2022: The dorsal foot wound is flush with the surrounding skin and has a good granulation tissue surface. There is some slough accumulation. As usual, the heel has a rim of macerated callus but the dimensions are smaller and the tunneling and undermining have contracted further. He has resumed his hyperbaric oxygen therapy. 12/12; patient seen for wound evaluation. He is tolerating HBO although we could not dive him yesterday because of relative hypoglycemia and the fact he had given himself NovoLog insulin before he came to clinic. Today his blood sugar is in the 180 range she should be fine. He has a large wound on the plantar calcaneus on the right and a more superficial area on the dorsal foot. He is using a scooter for offloading 11/30/2022: The dorsal foot wound continues to contract. There is some slough on the wound surface. The large calcaneus wound has heaped up wet callus around the margins. Continued undermining. 12/08/2022: The dorsal foot wound is flat and flush with the surrounding skin surface. There is some slough present. Once again, the calcaneal wound has thick, absolutely macerated callus hanging off in tatters around the edges. The patient cannot explain to me why this part of his wound gets so wet. There is slough on the surface. The undermined portion of the wound has filled in. 12/17/2022: Earlier this week, he presented for his hyperbaric oxygen therapy and was hypotensive and ill-appearing. He also had what appeared to be an infected sebaceous cyst on his right upper arm. He was sent to the emergency department. He was given a fluid bolus and the ED provider lanced the cyst. He was prescribed doxycycline. He seems to be feeling better today. He notes that he has had copious drainage from his foot. On further questioning, he states  that he has not been taking his oral diuretic. The dorsal foot wound has expanded somewhat, but is actually more superficial. The plantar heel wound is about the same. 12/27/2022: The dorsal foot wound has epithelialized further. The plantar foot wound is about the same size but has heaped up macerated callus around the perimeter. The intake nurse noted a tiny fragment of bone on his dressings and there is now an area at about the 5 o'clock position where 1 can palpate bone through a small slit in the soft tissues. The wound on his arm is contracting but he still likely has cyst wall present due to the manner in which the infected sebaceous cyst was addressed in the ER. Patient History Family History Cancer - Father,Mother, Diabetes - Mother,Father, Lung Disease - Father, Thyroid Problems - Mother, No family history of Heart Disease, Hereditary Spherocytosis, Hypertension, Kidney Disease, Seizures,  Stroke, Tuberculosis. Social History Never smoker, Marital Status - Married, Alcohol Use - Never, Drug Use - No History, Caffeine Use - Daily. Medical History Eyes Patient has history of Cataracts Denies history of Glaucoma, Optic Neuritis Ear/Nose/Mouth/Throat Denies history of Chronic sinus problems/congestion, Middle ear problems Respiratory Patient has history of Sleep Apnea Cardiovascular Patient has history of Hypertension Gastrointestinal Denies history of Cirrhosis , Colitis, Crohnoos, Hepatitis A, Hepatitis B, Hepatitis C Endocrine Patient has history of Type II Diabetes Immunological Denies history of Lupus Erythematosus, Raynaudoos, Scleroderma Musculoskeletal Patient has history of Osteomyelitis - 2023 Neurologic Patient has history of Neuropathy - Bila lower extremities Oncologic Denies history of Received Chemotherapy, Received Radiation Psychiatric Denies history of Anorexia/bulimia, Confinement Anxiety Hospitalization/Surgery History - I and D Left calcaneus. - back  surgery- laminectomy. - eye surgery- Bila cataracts. - shoulder arthroscopy. Medical A Surgical History Notes nd Cardiovascular hyperlipidemia Genitourinary AKI Objective Constitutional no acute distress. Vitals Time Taken: 1:05 PM, Height: 74 in, Weight: 245 lbs, BMI: 31.5. General Notes: see HBO encounter RESHAUN, BRISENO ([id number]) [id number] Page 10 of 14 Respiratory Normal work of breathing on room air. General Notes: 12/27/2022: The dorsal foot wound has epithelialized further. The plantar foot wound is about the same size but has heaped up macerated callus around the perimeter. The intake nurse noted a tiny fragment of bone on his dressings and there is now an area at about the 5 o'clock position where one can palpate bone through a small slit in the soft tissues. The wound on his arm is contracting but he still likely has cyst wall present due to the manner in which the infected sebaceous cyst was addressed in the ER. Integumentary (Hair, Skin) Wound #1 status is Open. Original cause of wound was Pressure Injury. The date acquired was: 03/09/2019. The wound has been in treatment 15 weeks. The wound is located on the Left Calcaneus. The wound measures 6.2cm length x 4.4cm width x 1.6cm depth; 21.426cm^2 area and 34.281cm^3 volume. There is bone and Fat Layer (Subcutaneous Tissue) exposed. There is no tunneling noted, however, there is undermining starting at 4:00 and ending at 6:00 with a maximum distance of 0.6cm. There is a large amount of serosanguineous drainage noted. The wound margin is distinct with the outline attached to the wound base. There is large (67-100%) red granulation within the wound bed. There is a small (1-33%) amount of necrotic tissue within the wound bed including Adherent Slough. The periwound skin appearance had no abnormalities noted for color. The periwound skin appearance exhibited: Callus, Maceration. Periwound temperature was  noted as No Abnormality. Wound #2 status is Open. Original cause of wound was Pressure Injury. The date acquired was: 07/09/2022. The wound has been in treatment 15 weeks. The wound is located on the Left,Dorsal Foot. The wound measures 2.7cm length x 3.4cm width x 0.1cm depth; 7.21cm^2 area and 0.721cm^3 volume. There is Fat Layer (Subcutaneous Tissue) exposed. There is no tunneling or undermining noted. There is a medium amount of serosanguineous drainage noted. The wound margin is distinct with the outline attached to the wound base. There is medium (34-66%) red granulation within the wound bed. There is a medium (34-66%) amount of necrotic tissue within the wound bed including Adherent Slough. The periwound skin appearance had no abnormalities noted for moisture. The periwound skin appearance had no abnormalities noted for color. The periwound skin appearance exhibited: Scarring. Periwound temperature was noted as No Abnormality. Wound #3 status is Open. Original cause of wound was Gradually  Appeared. The date acquired was: 12/14/2022. The wound has been in treatment 1 weeks. The wound is located on the Right Shoulder. The wound measures 0.9cm length x 0.2cm width x 1.7cm depth; 0.141cm^2 area and 0.24cm^3 volume. There is Fat Layer (Subcutaneous Tissue) exposed. There is no tunneling noted, however, there is undermining starting at 12:00 and ending at 12:00 with a maximum distance of 1.8cm. There is a medium amount of purulent drainage noted. The wound margin is distinct with the outline attached to the wound base. There is large (67-100%) red granulation within the wound bed. There is no necrotic tissue within the wound bed. The periwound skin appearance had no abnormalities noted for texture. The periwound skin appearance had no abnormalities noted for moisture. The periwound skin appearance did not exhibit: Erythema. Periwound temperature was noted as No Abnormality. 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 There was a Selective/Open Wound Skin/Epidermis Debridement with a total area of 27.28 sq cm performed by [name], MD. With the following instrument(s): Curette to remove Non-Viable tissue/material. Material removed includes Callus, Slough, and Skin: Epidermis. No specimens were taken. A time out was conducted at 13:15, prior to the start of the procedure. A Minimum amount of bleeding was controlled with Pressure. The procedure was tolerated well with a pain level of 0 throughout and a pain level of 0 following the procedure. Post Debridement Measurements: 6.2cm length x 4.4cm width x 1.6cm depth; 34.281cm^3 volume. Character of Wound/Ulcer Post Debridement is improved. Severity of Tissue Post Debridement is: Necrosis of bone. Post procedure Diagnosis Wound #1: Same as Pre-Procedure General Notes: scribed for Dr. Lady Gary by Zenaida Deed, RN. 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 There was a Selective/Open Wound Non-Viable Tissue Debridement with a total area of 9.18 sq cm performed by [name], MD. With the following instrument(s): Curette to remove Non-Viable tissue/material. Material removed includes [facility]. No specimens were taken. A time out was conducted at 13:15, prior to the start of the procedure. A Minimum amount of  bleeding was controlled with Pressure. The procedure was tolerated well with a pain level of 0 throughout and a pain level of 0 following the procedure. Post Debridement Measurements: 2.7cm length x 3.4cm width x 0.1cm depth; 0.721cm^3 volume. Character of Wound/Ulcer Post Debridement is improved. Severity of Tissue Post Debridement is: Fat layer exposed. Post procedure Diagnosis Wound #2: Same as Pre-Procedure General Notes: scribed for Dr. Lady Gary by Zenaida Deed, RN. Wound #3 Pre-procedure diagnosis of Wound #3 is a Cyst located on the Right Shoulder . There was a Excisional Skin/Subcutaneous Tissue Debridement with a total area of 0.18 sq cm performed by [name], MD. With the following instrument(s): Curette to remove Viable and Non-Viable tissue/material. Material removed includes Subcutaneous Tissue and Other: cyst wall. No specimens were taken. A time out was conducted at 13:15, prior to the start of the procedure. A Minimum amount of bleeding was controlled with Pressure. The  procedure was tolerated well with a pain level of 0 throughout and a pain level of 0 following the procedure. Post Debridement Measurements: 0.9cm length x 0.2cm width x 1.7cm depth; 0.24cm^3 volume. Character of Wound/Ulcer Post Debridement is improved. Post procedure Diagnosis Wound #3: Same as Pre-Procedure General Notes: scribed for Dr. Lady Gary by Zenaida Deed, RN. MANNING, LUNA R ([id number]) [id number] Page 11 of 14 Plan Follow-up Appointments: Return Appointment in 1 week. - Dr. Lady Gary Rm 1 Monday 1/22 @ 0815 am Anesthetic: (In clinic) Topical Lidocaine 5% applied to wound bed (In clinic) Xylocaine 1% injection applied to wound bed Cellular or Tissue Based Products: Other Cellular or Tissue Based Products Orders/Instructions: - run insurance for Apligraf Bathing/ Shower/ Hygiene: May shower with protection but do not get wound dressing(s) wet. Protect dressing(s)  with water repellant cover (for example, large plastic bag) or a cast cover and may then take shower. Negative Presssure Wound Therapy: Wound Vac to wound continuously at 152mm/hg pressure - to left calcaneus Black Foam Edema Control - Lymphedema / SCD / Other: Avoid standing for long periods of time. Off-Loading: Wound #1 Left Calcaneus: Other: - use knee scooter to ambulate, minimal weight bearing right foot Home Health: Admit to Home Health for skilled nursing wound care. May utilize formulary equivalent dressing for wound treatment orders unless otherwise specified. New wound care orders this week; continue Home Health for wound care. May utilize formulary equivalent dressing for wound treatment orders unless otherwise specified. Dressing changes to be completed by Home Health on Monday / Wednesday / Friday except when patient has scheduled visit at [facility]. Hyperbaric Oxygen Therapy: Wound #1 Left Calcaneus: Evaluate for HBO Therapy Indication: - chronic refractory osteomyelitis If appropriate for treatment, begin HBOT per protocol: 2.5 ATA for 90 Minutes with 2 Five (5) Minute Air Breaks T Number of Treatments: - 40 otal One treatments per day (delivered Monday through Friday unless otherwise specified in Special Instructions below): Finger stick Blood Glucose Pre- and Post- HBOT Treatment. Follow Hyperbaric Oxygen Glycemia Protocol Afrin (Oxymetazoline HCL) 0.05% nasal spray - 1 spray in both nostrils daily as needed prior to HBO treatment for difficulty clearing ears WOUND #1: - Calcaneus Wound Laterality: Left Cleanser: Normal Saline 3 x Per Week/30 Days Discharge Instructions: Cleanse the wound with Normal Saline prior to applying a clean dressing using gauze sponges, not tissue or cotton balls. Cleanser: Soap and Water 3 x Per Week/30 Days Discharge Instructions: May shower and wash wound with dial antibacterial soap and water prior to dressing change. Peri-Wound  Care: keystone 3 x Per Week/30 Days Secured With: Coban Self-Adherent Wrap 4x5 (in/yd) 3 x Per Week/30 Days Discharge Instructions: Secure with Coban as directed. Secured With: [employer], 4.5x3.1 (in/yd) 3 x Per Week/30 Days Discharge Instructions: Secure with Kerlix as directed. WOUND #2: - Foot Wound Laterality: Dorsal, Left Cleanser: Normal Saline Discharge Instructions: Cleanse the wound with Normal Saline prior to applying a clean dressing using gauze sponges, not tissue or cotton balls. Cleanser: Soap and Water Discharge Instructions: May shower and wash wound with dial antibacterial soap and water prior to dressing change. Peri-Wound Care: keystone Prim Dressing: Promogran Prisma Matrix, 4.34 (sq in) (silver collagen) ary Discharge Instructions: Moisten collagen with hydrogel Secondary Dressing: Zetuvit Plus Silicone Border Dressing 4x4 (in/in) Discharge Instructions: Apply silicone border over primary dressing as directed. Secured With: Coban Self-Adherent Wrap 4x5 (in/yd) Discharge Instructions: Secure with Coban as directed. Secured With: [employer], 4.5x3.1 (in/yd) Discharge Instructions: Secure with  Kerlix as directed. WOUND #3: - Shoulder Wound Laterality: Right Cleanser: Soap and Water 1 x Per Day/30 Days Discharge Instructions: May shower and wash wound with dial antibacterial soap and water prior to dressing change. Cleanser: Wound Cleanser 1 x Per Day/30 Days Discharge Instructions: Cleanse the wound with wound cleanser prior to applying a clean dressing using gauze sponges, not tissue or cotton balls. Peri-Wound Care: Skin Prep 1 x Per Day/30 Days Discharge Instructions: Use skin prep as directed Prim Dressing: Iodoform packing strip 1/4 (in) 1 x Per Day/30 Days ary Discharge Instructions: Lightly pack as instructed Secondary Dressing: Zetuvit Plus Silicone Border Dressing 4x4 (in/in) 1 x Per Day/30 Days Discharge Instructions: Apply silicone border  over primary dressing as directed. 12/27/2022: The dorsal foot wound has epithelialized further. The plantar foot wound is about the same size but has heaped up macerated callus around the perimeter. The intake nurse noted a tiny fragment of bone on his dressings and there is now an area at about the 5 o'clock position where one can palpate bone through a small slit in the soft tissues. The wound on his arm is contracting but he still likely has cyst wall present due to the manner in which the infected sebaceous cyst was addressed in the ER. I used a curette to debride slough from the dorsal foot wound, callus and slough from the plantar foot wound, nonviable subcutaneous tissue, including additional fragments of cyst wall from the shoulder wound. We will continue to pack the shoulder wound with iodoform packing strips. I am going to change the dorsal foot wound dressing to Prisma silver collagen moistened with hydrogel, as it is beginning to look somewhat dry. The patient has a wound VAC that has been approved and he has it with him in clinic today. We will apply this over his Keystone topical antimicrobial compound. Follow-up in 1 week. RAYNER, ERMAN R ([id number]) [id number] Page 12 of 14 Electronic Signature(s) Signed: 12/29/2022 8:16:55 AM By: [name] MD FACS Previous Signature: 12/27/2022 1:40:57 PM Version By: [name] MD FACS Entered By: [name] on 12/29/2022 08:16:55 -------------------------------------------------------------------------------- HxROS Details Patient Name: Date of Service: [name], [name] 12/27/2022 12:30 PM Medical Record Number: [id number] Patient Account Number: [id number] Date of Birth/Sex: Treating RN: 08-25-1956 (67 y.o. M) Primary Care Provider: Foye [name] Other Clinician: Referring Provider: Treating Provider/Extender: Kandis Cocking in Treatment: 15 Eyes Medical  History: Positive for: Cataracts Negative for: Glaucoma; Optic Neuritis Ear/Nose/Mouth/Throat Medical History: Negative for: Chronic sinus problems/congestion; Middle ear problems Respiratory Medical History: Positive for: Sleep Apnea Cardiovascular Medical History: Positive for: Hypertension Past Medical History Notes: hyperlipidemia Gastrointestinal Medical History: Negative for: Cirrhosis ; Colitis; Crohns; Hepatitis A; Hepatitis B; Hepatitis C Endocrine Medical History: Positive for: Type II Diabetes Time with diabetes: 20 years Treated with: Insulin Blood sugar tested every day: Yes Tested : 2-3 Genitourinary Medical History: Past Medical History Notes: AKI Immunological Medical History: Negative for: Lupus Erythematosus; Raynauds; Scleroderma Musculoskeletal Medical History: Positive for: Osteomyelitis - 2023 Neurologic Medical History: Positive for: Neuropathy - Bila lower extremities AABAN, GRIEP ([id number]) [id number] Page 13 of 14 Oncologic Medical History: Negative for: Received Chemotherapy; Received Radiation Psychiatric Medical History: Negative for: Anorexia/bulimia; Confinement Anxiety HBO Extended History Items Eyes: Cataracts Immunizations Pneumococcal Vaccine: Received Pneumococcal Vaccination: Yes Received Pneumococcal Vaccination On or After 60th Birthday: Yes Implantable Devices Yes Hospitalization / Surgery History Type of Hospitalization/Surgery I and D Left calcaneus back surgery- laminectomy eye surgery- Bila cataracts shoulder arthroscopy  Family and Social History Cancer: Yes Scientist, research (physical sciences); Diabetes: Yes - Mother,Father; Heart Disease: No; Hereditary Spherocytosis: No; Hypertension: No; Kidney Disease: No; Lung Disease: Yes - Father; Seizures: No; Stroke: No; Thyroid Problems: Yes - Mother; Tuberculosis: No; Never smoker; Marital Status - Married; Alcohol Use: Never; Drug Use: No History;  Caffeine Use: Daily; Financial Concerns: No; Food, Clothing or Shelter Needs: No; Support System Lacking: No; Transportation Concerns: No Electronic Signature(s) Signed: 12/27/2022 1:50:49 PM By: [name] MD FACS Entered By: [name] on 12/27/2022 13:37:25 -------------------------------------------------------------------------------- SuperBill Details Patient Name: Date of Service: [name], [name] 12/27/2022 Medical Record Number: [id number] Patient Account Number: [id number] Date of Birth/Sex: Treating RN: 1956-11-08 (67 y.o. 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, left ankle and foot E11.65 Type 2 diabetes mellitus with hyperglycemia E11.621 Type 2 diabetes mellitus with foot ulcer Facility Procedures : [name], R The patient participates with Medicare or their insurance follows the Medicare Facility Guidelines: CPT4 Code Description Modifier Quantity ONALD R ([id number]) [id number] Page 14 of 14 [id number] 11042 - DEB SUBQ TISSUE 20 SQ  CM/< 1 ICD-10 Diagnosis Description L72.3 Sebaceous cyst : The patient participates with Medicare or their insurance follows the Medicare Facility Guidelines: [id number] - DEBRIDE WOUND 1ST 20 SQ CM OR < 1 ICD-10 Diagnosis Description 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 L97.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 : The patient participates with Medicare or their insurance follows the Medicare Facility Guidelines: [id number]  97598 - DEBRIDE WOUND EA ADDL 20 SQ CM 1 ICD-10 Diagnosis Description 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 L97.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 Physician Procedures : CPT4 Code Description Modifier [id number] - WC PHYS LEVEL 4 - EST PT 25 ICD-10 Diagnosis Description 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 L72.3 Sebaceous cyst E11.621 Type 2 diabetes mellitus with foot ulcer Quantity: 1 : [id number] 11042 - WC PHYS SUBQ TISS 20 SQ CM ICD-10 Diagnosis Description L72.3 Sebaceous cyst Quantity: 1 : [id number] 97597 - WC PHYS DEBR WO ANESTH 20 SQ CM ICD-10 Diagnosis Description 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

## 2022-12-27 NOTE — Progress Notes (Signed)
ICHOLAS, IRBY R ([id number]) [id number] Page 1 of 1 Visit Report for 12/27/2022 SuperBill Details Patient Name: Date of Service: [name], [name] [name] 12/27/2022 Medical Record Number: [id number] Patient Account Number: [id number] Date of Birth/Sex: Treating RN: 1956/04/15 (67 y.o. 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 L72.3 Sebaceous cyst A45.364 Other chronic osteomyelitis, left ankle and foot E11.65 Type 2 diabetes mellitus with hyperglycemia E11.621 Type 2 diabetes mellitus with foot ulcer Facility Procedures The patient participates with Medicare or their insurance follows the Medicare Facility Guidelines CPT4 Code Description Modifier Quantity [id number]-(Facility Use Only) HBOT full body chamber, 53min , 4 ICD-10 Diagnosis Description [phone] Other chronic osteomyelitis, 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 Physician Procedures Quantity CPT4 Code Description Modifier [id number] 91694 - WC PHYS HYPERBARIC OXYGEN THERAPY 1 ICD-10 Diagnosis Description 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, 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

## 2022-12-27 NOTE — Progress Notes (Signed)
[name], [name] ([id number]) [id number] Page 1 of 11 Visit Report for 12/27/2022 Arrival Information Details Patient Name: Date of Service: [name]. 12/27/2022 12:30 PM Medical Record Number: [id number] Patient Account Number: [id number] Date of Birth/Sex: Treating RN: [name] (67 y.o. [name], [name] Primary Care [name]: [name] Other Clinician: Referring Jamarria Real: Treating Brayla Pat/Extender: [name] in Treatment: 15 Visit Information History Since Last Visit Added or deleted any medications: No Patient Arrived: Ambulatory Any new allergies or adverse reactions: No Arrival Time: 13:05 Had a fall or experienced change in No Accompanied By: self activities of daily living that may affect Transfer Assistance: None risk of falls: Patient Identification Verified: Yes Signs or symptoms of abuse/neglect since last visito No Secondary Verification Process Completed: Yes Hospitalized since last visit: No Patient Requires Transmission-Based Precautions: No Implantable device outside of the clinic excluding No Patient Has Alerts: No cellular tissue based products placed in the center since last visit: Has Dressing in Place as Prescribed: Yes Pain Present Now: No Electronic Signature(s) Signed: 12/27/2022 4:59:31 PM By: Zenaida Deed RN, BSN Entered By: Zenaida Deed on 12/27/2022 13:05:33 -------------------------------------------------------------------------------- Encounter Discharge Information Details Patient Name: Date of Service: [place], [name] [name]. 12/27/2022 12:30 PM Medical Record Number: [id number] Patient Account Number: [id number] Date of Birth/Sex: Treating RN: [name] (67 y.o. [name] Primary Care Biana Haggar: [name] Other Clinician: Referring [name]: Treating [name]/Extender: [name] in Treatment: 15 Encounter Discharge  Information Items Post Procedure Vitals Discharge Condition: Stable Temperature (F): 98 Ambulatory Status: Ambulatory Pulse (bpm): 89 Discharge Destination: Home Respiratory Rate (breaths/min): 18 Transportation: Private Auto Blood Pressure (mmHg): 129/69 Accompanied By: self Schedule Follow-up Appointment: Yes Clinical Summary of Care: Patient Declined Electronic Signature(s) Signed: 12/27/2022 4:59:31 PM By: Zenaida Deed RN, BSN Entered By: Zenaida Deed on 12/27/2022 14:53:38 [name] ([id number]) [id number] Page 2 of 11 -------------------------------------------------------------------------------- Lower Extremity Assessment Details Patient Name: Date of Service: [name]. 12/27/2022 12:30 PM Medical Record Number: [id number] Patient Account Number: [id number] Date of Birth/Sex: Treating RN: [birth year] (67 y.o. [name] Primary Care Sima Lindenberger: [name] Other Clinician: Referring [name]: Treating [name] in Treatment: 15 Edema Assessment Assessed: [name]: No] [name]: No] Edema: [Left: Ye] [Right: s] Calf Left: Right: Point of Measurement: From Medial Instep 48 cm Ankle Left: Right: Point of Measurement: From Medial Instep 26 cm Vascular Assessment Pulses: Dorsalis Pedis Palpable: [Left:Yes] Electronic Signature(s) Signed: 12/27/2022 4:59:31 PM By: Zenaida Deed RN, BSN Entered By: Zenaida Deed on 12/27/2022 13:06:20 -------------------------------------------------------------------------------- Multi Wound Chart Details Patient Name: Date of Service: [name], [name] NA [name] [name]. 12/27/2022 12:30 PM Medical Record Number: [id number] Patient Account Number: [id number] Date of Birth/Sex: Treating RN: [birth year] (67 y.o. M) Primary Care Shandora Koogler: [name] Other Clinician: Referring [name]: Treating [name]/Extender: [name] in Treatment: 15 [1:Photos:] Left Calcaneus Left, Dorsal Foot Right Shoulder Wound Location: Pressure Injury Pressure Injury Gradually Appeared Wounding Event: Diabetic Wound/Ulcer of the Lower Diabetic Wound/Ulcer of the Lower Cyst Primary Etiology: Extremity Extremity Cataracts, Sleep Apnea, Hypertension, Cataracts, Sleep Apnea, Hypertension, Cataracts, Sleep Apnea, Hypertension, Comorbid History: Type II Diabetes, Osteomyelitis, Type II Diabetes, Osteomyelitis, Type II Diabetes, Osteomyelitis, Neuropathy Neuropathy Neuropathy [name], [name] ([id number]) [id number] Page 3 of 11 03/09/2019 07/09/2022 12/14/2022 Date Acquired: 15 15 1  Weeks of Treatment: Open Open Open Wound Status: No No No Wound Recurrence: 6.2x4.4x1.6 2.7x3.4x0.1 0.9x0.2x1.7 Measurements L x W x D (cm) 21.426 7.21  0.141 A (cm) : rea 34.281 0.721 0.24 Volume (cm) : 0.80% 23.50% -11.90% % Reduction in A rea: -58.70% 23.50% -860.00% % Reduction in Volume: 4 12 Starting Position 1 (o'clock): 6 12 Ending Position 1 (o'clock): 0.6 1.8 Maximum Distance 1 (cm): Yes No Yes Undermining: Grade 3 Grade 2 Full Thickness Without Exposed Classification: Support Structures Large Medium Medium Exudate A mount: Serosanguineous Serosanguineous Purulent Exudate Type: red, brown red, brown yellow, brown, green Exudate Color: Distinct, outline attached Distinct, outline attached Distinct, outline attached Wound Margin: Large (67-100%) Medium (34-66%) Large (67-100%) Granulation A mount: Red Red Red Granulation Quality: Small (1-33%) Medium (34-66%) None Present (0%) Necrotic A mount: Fat Layer (Subcutaneous Tissue): Yes Fat Layer (Subcutaneous Tissue): Yes Fat Layer (Subcutaneous Tissue): Yes Exposed Structures: Bone: Yes Fascia: No Fascia: No Fascia: No Tendon: No Tendon: No Tendon: No Muscle: No Muscle: No Muscle: No Joint: No Joint: No Joint: No Bone:  No Bone: No Small (1-33%) Small (1-33%) Small (1-33%) Epithelialization: Debridement - Selective/Open Wound Debridement - Selective/Open Wound Debridement - Excisional Debridement: Pre-procedure Verification/Time Out 13:15 13:15 13:15 Taken: Callus, [employer] Other, Subcutaneous Tissue Debrided: Skin/Epidermis Non-Viable Tissue Skin/Subcutaneous Tissue Level: 27.28 9.18 0.18 Debridement A (sq cm): rea Curette Curette Curette Instrument: Minimum Minimum Minimum Bleeding: Pressure Pressure Pressure Hemostasis A chieved: 0 0 0 Procedural Pain: 0 0 0 Post Procedural Pain: Procedure was tolerated well Procedure was tolerated well Procedure was tolerated well Debridement Treatment Response: 6.2x4.4x1.6 2.7x3.4x0.1 0.9x0.2x1.7 Post Debridement Measurements L x W x D (cm) 34.281 0.721 0.24 Post Debridement Volume: (cm) Callus: Yes Scarring: Yes No Abnormalities Noted Periwound Skin Texture: Maceration: Yes No Abnormalities Noted No Abnormalities Noted Periwound Skin Moisture: No Abnormalities Noted No Abnormalities Noted Erythema: No Periwound Skin Color: No Abnormality No Abnormality No Abnormality Temperature: Debridement Debridement Debridement Procedures Performed: Treatment Notes Electronic Signature(s) Signed: 12/27/2022 1:29:25 PM By: [name] MD FACS Entered By: [name] on 12/27/2022 13:29:24 -------------------------------------------------------------------------------- Multi-Disciplinary Care Plan Details Patient Name: Date of Service: [name], [name] [name]. 12/27/2022 12:30 PM Medical Record Number: [id number] Patient Account Number: [id number] Date of Birth/Sex: Treating RN: [name] (67 y.o. 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 and treatment procedures Potential for barotraumas to ears, sinuses, teeth, and lungs or cerebral gas embolism related to changes in atmospheric pressure inside hyperbaric oxygen chamber Potential for oxygen toxicity seizures related to delivery of 100% oxygen at an increased atmospheric pressure Potential for pulmonary oxygen toxicity related to delivery of 100% oxygen at an increased atmospheric pressure Goals: Barotrauma will be prevented during HBO2 Date Initiated: 10/06/2022 Target Resolution Date: 01/14/2023 Goal Status: Active Patient and/or family will be able to state/discuss factors appropriate to the management of their disease process during treatment Date Initiated: 10/06/2022 Target Resolution Date: 01/14/2023 Goal Status: Active Patient will tolerate the hyperbaric oxygen therapy treatment Date Initiated: 10/06/2022 Target Resolution Date: 01/14/2023 Goal Status: Active Patient/caregiver will verbalize understanding of HBO goals, rationale, procedures and potential hazards Date Initiated: 10/06/2022 Target Resolution Date: 01/14/2023 Goal Status: Active Interventions: Administer decongestants, per physician orders, prior to HBO2 Administer the correct therapeutic gas delivery based on the patients needs and limitations, per physician order Assess and provide for patients comfort related to the hyperbaric environment and equalization  of middle ear Assess for signs and symptoms related to adverse events, including but not limited to confinement anxiety, pneumothorax, oxygen toxicity and baurotrauma Notes: Wound/Skin Impairment Nursing Diagnoses: Impaired tissue integrity Goals: Patient/caregiver will verbalize understanding of skin care regimen Date Initiated: 10/06/2022 Target Resolution Date:  01/14/2023 Goal Status: Active Ulcer/skin breakdown will have a volume reduction of 30% by week 4 Date Initiated: 09/10/2022 Date Inactivated: 10/06/2022 Target Resolution Date: 10/08/2022 Goal Status: Unmet Unmet Reason: osteo, HBOT Ulcer/skin breakdown will have a volume reduction of 50% by week 8 Date Initiated: 10/06/2022 Target Resolution Date: 01/14/2023 Goal Status: Active Interventions: Assess ulceration(s) every visit Provide education on ulcer and skin care Treatment Activities: Consult for HBO : 09/10/2022 Skin care regimen initiated : 09/10/2022 Notes: Electronic Signature(s) Signed: 12/27/2022 4:59:31 PM By: Baruch Gouty RN, BSN Entered By: Baruch Gouty on 12/27/2022 13:14:42 -------------------------------------------------------------------------------- Negative Pressure Wound Therapy Application (NPWT) Details Patient Name: Date of Service: HARON, BEILKE [place] [name] [name]. 12/27/2022 12:30 PM Medical Record Number: [id number] Patient Account Number: [id number] Date of Birth/Sex: Treating RN: [name] (67 y.o. [name] Primary Care Nala Kachel: Nelda Bucks Other Clinician: Referring Jerrianne Hartin: Treating Marykay Mccleod/Extender: Doristine Bosworth in Treatment: [address] [place] ([id number]) [id number] Page 5 of 11 NPWT Application Performed for: Wound #1 Left Calcaneus Additional Injuries Covered: No Performed By: Baruch Gouty, RN Type: VAC System Coverage Size (sq cm): 27.28 Pressure Type: Constant Pressure Setting: 125 mmHG Drain Type: None Primary Contact: Other Other: sponge Quantity of Sponges/Gauze Inserted: 1 Sponge/Dressing Type: Foam, Black Date Initiated: 12/27/2022 Response to Treatment: good Post Procedure Diagnosis Same as Pre-procedure Electronic Signature(s) Signed: 12/27/2022 4:59:31 PM By: Baruch Gouty RN, BSN Entered By: Baruch Gouty on 12/27/2022  14:45:15 -------------------------------------------------------------------------------- Pain Assessment Details Patient Name: Date of Service: [place], [name] NA [name] [name]. 12/27/2022 12:30 PM Medical Record Number: [id number] Patient Account Number: [id number] Date of Birth/Sex: Treating RN: [name] (67 y.o. [name] Primary Care Rosaelena Kemnitz: Nelda Bucks Other Clinician: Referring Madora Barletta: Treating Jenina Moening/Extender: Doristine Bosworth in Treatment: 15 Active Problems Location of Pain Severity and Description of Pain Patient Has Paino No Site Locations Rate the pain. Current Pain Level: 0 Pain Management and Medication Current Pain Management: Electronic Signature(s) Signed: 12/27/2022 4:59:31 PM By: Baruch Gouty RN, BSN Entered By: Baruch Gouty on 12/27/2022 Douglass, [place] ([id number]) [id number] Page 6 of 11 -------------------------------------------------------------------------------- Patient/Caregiver Education Details Patient Name: Date of Service: BRICK, KETCHER [place] [name] [name]. 1/15/2024andnbsp12:30 PM Medical Record Number: [id number] Patient Account Number: [id number] Date of Birth/Gender: Treating RN: [name] (67 y.o. [name] Primary Care Physician: Nelda Bucks Other Clinician: Referring Physician: Treating Physician/Extender: Doristine Bosworth in Treatment: 15 Education Assessment Education Provided To: Patient Education Topics Provided Hyperbaric Oxygenation: Methods: Explain/Verbal Responses: Reinforcements needed, State content correctly Offloading: Methods: Explain/Verbal Responses: Reinforcements needed, State content correctly Wound/Skin Impairment: Methods: Explain/Verbal Responses: Reinforcements needed, State content correctly Electronic Signature(s) Signed: 12/27/2022 4:59:31 PM By: Baruch Gouty RN, BSN Entered By: Baruch Gouty on 12/27/2022  13:15:32 -------------------------------------------------------------------------------- Wound Assessment Details Patient Name: Date of Service: [place], [name] NA [name] [name]. 12/27/2022 12:30 PM Medical Record Number: [id number] Patient Account Number: [id number] Date of Birth/Sex: Treating RN: [birth year][name] (67 y.o. [name] Primary Care Kaeden Depaz: Nelda Bucks Other Clinician: Referring Ordean Fouts: Treating Bohdi Leeds/Extender: Doristine Bosworth in Treatment: 15 Wound Status Wound Number: 1 Primary Diabetic Wound/Ulcer of the Lower Extremity Etiology: Wound Location: Left Calcaneus Wound Open Wounding Event: Pressure Injury Status: Date Acquired:  03/09/2019 Comorbid Cataracts, Sleep Apnea, Hypertension, Type II Diabetes, Weeks Of Treatment: 15 History: Osteomyelitis, Neuropathy Clustered Wound: No Photos COLTIN, CASHER ([id number]) [id number] Page 7 of 11 Wound Measurements Length: (cm) 6.2 Width: (cm) 4.4 Depth: (cm) 1.6 Area: (cm) 21.426 Volume: (cm) 34.281 % Reduction in Area: 0.8% % Reduction in Volume: -58.7% Epithelialization: Small (1-33%) Tunneling: No Undermining: Yes Starting Position (o'clock): 4 Ending Position (o'clock): 6 Maximum Distance: (cm) 0.6 Wound Description Classification: Grade 3 Wound Margin: Distinct, outline attached Exudate Amount: Large Exudate Type: Serosanguineous Exudate Color: red, brown Foul Odor After Cleansing: No Slough/Fibrino Yes Wound Bed Granulation Amount: Large (67-100%) Exposed Structure Granulation Quality: Red Fascia Exposed: No Necrotic Amount: Small (1-33%) Fat Layer (Subcutaneous Tissue) Exposed: Yes Necrotic Quality: Adherent Slough Tendon Exposed: No Muscle Exposed: No Joint Exposed: No Bone Exposed: Yes Periwound Skin Texture Texture Color No Abnormalities Noted: No No Abnormalities Noted: Yes Callus: Yes Temperature / Pain Temperature: No  Abnormality Moisture No Abnormalities Noted: No Maceration: Yes Treatment Notes Wound #1 (Calcaneus) Wound Laterality: Left Cleanser Normal Saline Discharge Instruction: Cleanse the wound with Normal Saline prior to applying a clean dressing using gauze sponges, not tissue or cotton balls. Soap and Water Discharge Instruction: May shower and wash wound with dial antibacterial soap and water prior to dressing change. Peri-Wound Care keystone Topical Primary Dressing Secondary Dressing Secured With [employer] Self-Adherent Wrap 4x5 (in/yd) Discharge Instruction: Secure with Coban as directed. Kerlix Roll Sterile, 4.5x3.1 (in/yd) Discharge Instruction: Secure with Kerlix as directed. Compression [address] [name], [name] ([id number]) [id number] Page 8 of 11 Compression Stockings Add-Ons Electronic Signature(s) Signed: 12/27/2022 4:59:31 PM By: Baruch Gouty RN, BSN Entered By: Baruch Gouty on 12/27/2022 13:12:46 -------------------------------------------------------------------------------- Wound Assessment Details Patient Name: Date of Service: [name], [name] NA [name] [name]. 12/27/2022 12:30 PM Medical Record Number: [id number] Patient Account Number: [id number] Date of Birth/Sex: Treating RN: [name] (67 y.o. [name] Primary Care [name] Sanguinetti: Nelda Bucks Other Clinician: Referring Cecilia Nishikawa: Treating Kisha Messman/Extender: Doristine Bosworth in Treatment: 15 Wound Status Wound Number: 2 Primary Diabetic Wound/Ulcer of the Lower Extremity Etiology: Wound Location: Left, Dorsal Foot Wound Open Wounding Event: Pressure Injury Status: Date Acquired: 07/09/2022 Comorbid Cataracts, Sleep Apnea, Hypertension, Type II Diabetes, Weeks Of Treatment: 15 History: Osteomyelitis, Neuropathy Clustered Wound: No Photos Wound Measurements Length: (cm) 2.7 Width: (cm) 3.4 Depth: (cm) 0.1 Area: (cm) 7.21 Volume: (cm) 0.721 % Reduction in  Area: 23.5% % Reduction in Volume: 23.5% Epithelialization: Small (1-33%) Tunneling: No Undermining: No Wound Description Classification: Grade 2 Wound Margin: Distinct, outline attached Exudate Amount: Medium Exudate Type: Serosanguineous Exudate Color: red, brown Foul Odor After Cleansing: No Slough/Fibrino Yes Wound Bed Granulation Amount: Medium (34-66%) Exposed Structure Granulation Quality: Red Fascia Exposed: No Necrotic Amount: Medium (34-66%) Fat Layer (Subcutaneous Tissue) Exposed: Yes Necrotic Quality: Adherent Slough Tendon Exposed: No Muscle Exposed: No Joint Exposed: No Bone Exposed: No Periwound Skin Texture Texture Color No Abnormalities Noted: No No Abnormalities Noted: Yes Scarring: Yes Temperature / Pain [name], [name] ([id number]) [id number] Page 9 of 11 Temperature: No Abnormality Moisture No Abnormalities Noted: Yes Treatment Notes Wound #2 (Foot) Wound Laterality: Dorsal, Left Cleanser Normal Saline Discharge Instruction: Cleanse the wound with Normal Saline prior to applying a clean dressing using gauze sponges, not tissue or cotton balls. Soap and Water Discharge Instruction: May shower and wash wound with dial antibacterial soap and water prior to dressing change. Peri-Wound Care keystone Topical Primary Dressing Promogran Prisma Matrix, 4.34 (sq in) (silver collagen) Discharge Instruction: Moisten  collagen with hydrogel Secondary Dressing Zetuvit Plus Silicone Border Dressing 4x4 (in/in) Discharge Instruction: Apply silicone border over primary dressing as directed. Secured With [employer] 4x5 (in/yd) Discharge Instruction: Secure with Coban as directed. Kerlix Roll Sterile, 4.5x3.1 (in/yd) Discharge Instruction: Secure with Kerlix as directed. Compression Wrap Compression Stockings Add-Ons Electronic Signature(s) Signed: 12/27/2022 4:59:31 PM By: Zenaida Deed RN, BSN Entered By: Zenaida Deed on 12/27/2022 13:13:16 -------------------------------------------------------------------------------- Wound Assessment Details Patient Name: Date of Service: [name], [name] [name]. 12/27/2022 12:30 PM Medical Record Number: [id number] Patient Account Number: [id number] Date of Birth/Sex: Treating RN: [name] (67 y.o. [name] Primary Care Taqwa Deem: [name] Other Clinician: Referring Kendle Erker: Treating Donyae Kilner/Extender: [name] in Treatment: 15 Wound Status Wound Number: 3 Primary Cyst Etiology: Wound Location: Right Shoulder Wound Open Wounding Event: Gradually Appeared Status: Date Acquired: 12/14/2022 Comorbid Cataracts, Sleep Apnea, Hypertension, Type II Diabetes, Weeks Of Treatment: 1 History: Osteomyelitis, Neuropathy Clustered Wound: No Photos [name], [name] ([id number]) [id number] Page 10 of 11 Wound Measurements Length: (cm) 0.9 Width: (cm) 0.2 Depth: (cm) 1.7 Area: (cm) 0.141 Volume: (cm) 0.24 % Reduction in Area: -11.9% % Reduction in Volume: -860% Epithelialization: Small (1-33%) Tunneling: No Undermining: Yes Starting Position (o'clock): 12 Ending Position (o'clock): 12 Maximum Distance: (cm) 1.8 Wound Description Classification: Full Thickness Without Exposed Support Structures Wound Margin: Distinct, outline attached Exudate Amount: Medium Exudate Type: Purulent Exudate Color: yellow, brown, green Foul Odor After Cleansing: No Slough/Fibrino No Wound Bed Granulation Amount: Large (67-100%) Exposed Structure Granulation Quality: Red Fascia Exposed: No Necrotic Amount: None Present (0%) Fat Layer (Subcutaneous Tissue) Exposed: Yes Tendon Exposed: No Muscle Exposed: No Joint Exposed: No Bone Exposed: No Periwound Skin Texture Texture Color No Abnormalities Noted: Yes No Abnormalities Noted: No Erythema: No Moisture No Abnormalities Noted: Yes Temperature /  Pain Temperature: No Abnormality Treatment Notes Wound #3 (Shoulder) Wound Laterality: Right Cleanser Soap and Water Discharge Instruction: May shower and wash wound with dial antibacterial soap and water prior to dressing change. Wound Cleanser Discharge Instruction: Cleanse the wound with wound cleanser prior to applying a clean dressing using gauze sponges, not tissue or cotton balls. Peri-Wound Care Skin Prep Discharge Instruction: Use skin prep as directed Topical Primary Dressing Iodoform packing strip 1/4 (in) Discharge Instruction: Lightly pack as instructed Secondary Dressing Zetuvit Plus Silicone Border Dressing 4x4 (in/in) Discharge Instruction: Apply silicone border over primary dressing as directed. Secured With [employer] [name] ([id number]) [id number] Page 11 of 11 Compression Stockings Add-Ons Electronic Signature(s) Signed: 12/27/2022 4:59:31 PM By: Zenaida Deed RN, BSN Entered By: Zenaida Deed on 12/27/2022 13:13:58 -------------------------------------------------------------------------------- Vitals Details Patient Name: Date of Service: [name], [name] NA [name] [name]. 12/27/2022 12:30 PM Medical Record Number: [id number] Patient Account Number: [id number] Date of Birth/Sex: Treating RN: [name][birth year] (67 y.o. [name] Primary Care Tajae Rybicki: [name] Other Clinician: Referring Lilia Letterman: Treating Deatrice Spanbauer/Extender: [name] in Treatment: 15 Vital Signs Time Taken: 13:05 Reference Range: 80 - 120 mg / dl Height (in): 74 Weight (lbs): 245 Body Mass Index (BMI): 31.5 Notes see HBO encounter Electronic Signature(s) Signed: 12/27/2022 4:59:31 PM By: Zenaida Deed RN, BSN Entered By: Zenaida Deed on 12/27/2022 13:05:59

## 2022-12-27 NOTE — Progress Notes (Addendum)
HARRELL, NIEHOFF R ([id number]) [id number] Page 1 of 2 Visit Report for 12/27/2022 Arrival Information Details Patient Name: Date of Service: [place], [name] 12/27/2022 10:00 A M Medical Record Number: [id number] Patient Account Number: [id number] Date of Birth/Sex: Treating RN: 1956/04/07 (68 y.o. Burnadette Pop, Lauren Primary Care Cimone Fahey: Nelda Bucks Other Clinician: Donavan Burnet Referring Gurjit Loconte: Treating Caidyn Henricksen/Extender: Doristine Bosworth in Treatment: 15 Visit Information History Since Last Visit All ordered tests and consults were completed: Yes Patient Arrived: Ambulatory Added or deleted any medications: No Arrival Time: 09:21 Any new allergies or adverse reactions: No Accompanied By: self Had a fall or experienced change in No Transfer Assistance: None activities of daily living that may affect Patient Identification Verified: Yes risk of falls: Secondary Verification Process Completed: Yes Signs or symptoms of abuse/neglect since last visito No Patient Requires Transmission-Based Precautions: No Hospitalized since last visit: No Patient Has Alerts: No Implantable device outside of the clinic excluding No cellular tissue based products placed in the center since last visit: Pain Present Now: No Electronic Signature(s) Signed: 12/27/2022 2:56:41 PM By: Donavan Burnet CHT EMT BS , , Entered By: Donavan Burnet on 12/27/2022 14:56:41 -------------------------------------------------------------------------------- Encounter Discharge Information Details Patient Name: Date of Service: [place], [name] 12/27/2022 10:00 A M Medical Record Number: [id number] Patient Account Number: [id number] Date of Birth/Sex: Treating RN: 11/10/1956 (67 y.o. Burnadette Pop, Lauren Primary Care Verginia Toohey: Nelda Bucks Other Clinician: Donavan Burnet Referring Linnaea Ahn: Treating Aayliah Rotenberry/Extender: Doristine Bosworth in Treatment: 15 Encounter Discharge Information Items Discharge Condition: Stable Ambulatory Status: Ambulatory Discharge Destination: Other (Note Required) Transportation: Other Accompanied By: staff Schedule Follow-up Appointment: No Clinical Summary of Care: Notes Patient had wound care encounter after treatment. Electronic Signature(s) Signed: 12/27/2022 3:09:08 PM By: Donavan Burnet CHT EMT BS , , Entered By: Donavan Burnet on 12/27/2022 15:09:07 [name] ([id number]) [id number] Page 2 of 2 -------------------------------------------------------------------------------- Vitals Details Patient Name: Date of Service: [place], [name] 12/27/2022 10:00 A M Medical Record Number: [id number] Patient Account Number: [id number] Date of Birth/Sex: Treating RN: Sep 10, 1956 (67 y.o. Burnadette Pop, Lauren Primary Care Shamecka Hocutt: Nelda Bucks Other Clinician: Donavan Burnet Referring Fayetta Sorenson: Treating Cathy Ropp/Extender: Doristine Bosworth in Treatment: 15 Vital Signs Time Taken: 09:53 Temperature (F): 98.6 Height (in): 74 Pulse (bpm): 92 Weight (lbs): 245 Respiratory Rate (breaths/min): 18 Body Mass Index (BMI): 31.5 Blood Pressure (mmHg): 121/72 Capillary Blood Glucose (mg/dl): 206 Reference Range: 80 - 120 mg / dl Electronic Signature(s) Signed: 12/27/2022 2:57:09 PM By: Donavan Burnet CHT EMT BS , , Entered By: Donavan Burnet on 12/27/2022 14:57:08

## 2022-12-28 ENCOUNTER — Encounter (HOSPITAL_BASED_OUTPATIENT_CLINIC_OR_DEPARTMENT_OTHER): Payer: Medicare Other | Admitting: General Surgery

## 2022-12-28 DIAGNOSIS — E11621 Type 2 diabetes mellitus with foot ulcer: Secondary | ICD-10-CM | POA: Diagnosis not present

## 2022-12-28 LAB — GLUCOSE, CAPILLARY
Glucose-Capillary: 325 mg/dL — ABNORMAL HIGH (ref 70–99)
Glucose-Capillary: 328 mg/dL — ABNORMAL HIGH (ref 70–99)
Glucose-Capillary: 321 mg/dL — ABNORMAL HIGH (ref 70–99)
Glucose-Capillary: 295 mg/dL — ABNORMAL HIGH (ref 70–99)

## 2022-12-28 NOTE — Progress Notes (Addendum)
[name], [name] ([id number]) [id number] Page 1 of 2 Visit Report for 12/28/2022 Arrival Information Details Patient Name: Date of Service: [name], [name] 12/28/2022 10:00 A M Medical Record Number: [id number] Patient Account Number: [id number] Date of Birth/Sex: Treating RN: 1956/06/08 (67 y.o. [name] Primary Care Dawnelle Warman: Nelda Bucks Other Clinician: Donavan Burnet Referring Carden Teel: Treating Jese Comella/Extender: Doristine Bosworth in Treatment: 15 Visit Information History Since Last Visit All ordered tests and consults were completed: Yes Patient Arrived: Knee Scooter Added or deleted any medications: No Arrival Time: 09:50 Any new allergies or adverse reactions: No Accompanied By: self Had a fall or experienced change in No Transfer Assistance: None activities of daily living that may affect Patient Identification Verified: Yes risk of falls: Secondary Verification Process Completed: Yes Signs or symptoms of abuse/neglect since last visito No Patient Requires Transmission-Based Precautions: No Hospitalized since last visit: No Patient Has Alerts: No Implantable device outside of the clinic excluding No cellular tissue based products placed in the center since last visit: Pain Present Now: No Electronic Signature(s) Signed: 12/28/2022 1:27:12 PM By: Donavan Burnet CHT EMT BS , , Entered By: Donavan Burnet on 12/28/2022 13:27:11 -------------------------------------------------------------------------------- Encounter Discharge Information Details Patient Name: Date of Service: [name], [name] 12/28/2022 10:00 A M Medical Record Number: [id number] Patient Account Number: [id number] Date of Birth/Sex: Treating RN: [birth year] (67 y.o. [name] Primary Care Amando Ishikawa: Nelda Bucks Other Clinician: Valeria Batman Referring Lekeisha Arenas: Treating [name]/Extender: Doristine Bosworth in Treatment: 15 Encounter Discharge Information Items Discharge Condition: Stable Ambulatory Status: Knee Scooter Discharge Destination: Home Transportation: Private Auto Accompanied By: None Schedule Follow-up Appointment: Yes Clinical Summary of Care: Electronic Signature(s) Signed: 12/28/2022 1:58:32 PM By: Valeria Batman EMT Entered By: Valeria Batman on 12/28/2022 13:58:32 [name] ([id number]) [id number] Page 2 of 2 -------------------------------------------------------------------------------- Vitals Details Patient Name: Date of Service: [name], [name] 12/28/2022 10:00 A M Medical Record Number: [id number] Patient Account Number: [id number] Date of Birth/Sex: Treating RN: [birth year] (67 y.o. [name] Primary Care Karthikeya Funke: Nelda Bucks Other Clinician: Valeria Batman Referring Mikle Sternberg: Treating [name]/Extender: Doristine Bosworth in Treatment: 15 Vital Signs Time Taken: 10:42 Temperature (F): 98.1 Height (in): 74 Pulse (bpm): 88 Weight (lbs): 245 Respiratory Rate (breaths/min): 16 Body Mass Index (BMI): 31.5 Blood Pressure (mmHg): 134/69 Reference Range: 80 - 120 mg / dl Electronic Signature(s) Signed: 12/28/2022 1:28:30 PM By: Donavan Burnet CHT EMT BS , , Entered By: Donavan Burnet on 12/28/2022 13:28:29

## 2022-12-28 NOTE — Progress Notes (Signed)
SAMAN, UMSTEAD R ([id number]) [id number] Page 1 of 2 Visit Report for 12/28/2022 Problem List Details Patient Name: Date of Service: [name], [name] 12/28/2022 10:00 A M Medical Record Number: [id number] Patient Account Number: [id number] Date of Birth/Sex: Treating RN: Dec 18, 1955 (67 y.o. 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 12/28/2022 Medical Record Number: [id number] Patient Account Number: [id number] Date of Birth/Sex: Treating RN: 30-May-1956 (67 y.o. 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 L72.3 Sebaceous cyst 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, left ankle and foot E11.65 Type 2 diabetes mellitus with hyperglycemia E11.621 Type 2 diabetes mellitus with foot ulcer Facility Procedures : The patient participates with Medicare or their insurance follows the Medicare Facility Guidelines: CPT4 Code Description Modifier Quantity [id number] G0277-(Facility Use Only) HBOT full body chamber, 78min , 4 ICD-10 Diagnosis Description [phone] Other  chronic osteomyelitis, 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 2 diabetes mellitus with foot  ulcer Physician Procedures : CPT4 Code Description Modifier [id number] - WC PHYS HYPERBARIC OXYGEN THERAPY ICD-10 Diagnosis Description 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, left ankle and foot L97.424 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

## 2022-12-28 NOTE — Progress Notes (Addendum)
[name] ([id number]) [id number] Page 1 of 2 Visit Report for 12/28/2022 HBO Details Patient Name: Date of Service: [name], [name] [name]. 12/28/2022 10:00 A M Medical Record Number: [id number] Patient Account Number: [id number] Date of Birth/Sex: Treating RN: [birth year] (67 y.o. 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 HBO Treatment Details Treatment Number: 32 Patient Type: Outpatient Chamber Type: Monoplace Chamber Serial #: [id number] Treatment Protocol: 2.0 ATA with 90 minutes oxygen, with two 5 minute air breaks Treatment Details Compression Rate Down: 2.0 psi / minute De-Compression Rate Up: 2.0 psi / minute A breaks and breathing ir Compress Tx Pressure periods Decompress Decompress Begins Reached (leave unused spaces Begins Ends blank) Chamber Pressure (ATA 1 2 2 2 2 2  --2 1 ) Clock Time (24 hr) 10:52 10:04 11:34 11:39 12:09 12:14 - - 12:44 12:55 Treatment Length: 123 (minutes) Treatment Segments: 4 Vital Signs Capillary Blood Glucose Reference Range: 80 - 120 mg / dl HBO Diabetic Blood Glucose Intervention Range: <131 mg/dl or >249 mg/dl Type: Time Vitals Blood Pulse: Respiratory Temperature: Capillary Blood Glucose Pulse Action Taken: Pressure: Rate: Glucose (mg/dl): Meter #: Oximetry (%) Taken: Pre 10:42 134/69 88 16 98.1 Pre 09:55 325 1 informed physician Pre 10:16 328 1 informed physician, 5 units insulin taken Pre 10:46 321 1 this was a safety check Post 13:02 120/69 72 16 97.3 295 [name] informed of blood sugar Treatment Response Treatment Toleration:  Well Treatment Completion Status: Treatment Completed without Adverse Event Treatment Notes Patient arrived stating that his blood glucose was 343 mg/dL this morning at 0330, took 10 units of fast-acting Novolog, and ate a chicken salad sandwich about 0335. CBG measured at clinic today was 325 mg/dL at 0955. For safety it was re-measured at 1016 with a result of 328 mg/dL. [name] stated that he had his insulin with him today and I told him to wait until I speak with the physician before self-administering the medication. I informed [name], and [name] spoke to patient. Patient took 5 units Novolog and then prepared for treatment. ([name]) Physician HBO Attestation: I certify that I supervised this HBO treatment in accordance with Medicare guidelines. A trained emergency response team is readily available per Yes hospital policies and procedures. Continue HBOT as ordered. Yes Electronic Signature(s) Signed: 12/28/2022 3:56:13 PM By: [name] Previous Signature: 12/28/2022 2:41:47 PM Version By: [name] CHT EMT BS , , Previous Signature: 12/28/2022 1:57:14 PM Version By: [name] EMT Previous Signature: 12/28/2022 1:40:48 PM Version By: [name] CHT EMT BS , , Previous Signature: 12/28/2022 1:40:23 PM Version By: [name] CHT EMT BS , , Entered By: [name] on 12/28/2022 15:56:13 [name], [name] ([id number]) [id number] Page 2 of 2 -------------------------------------------------------------------------------- HBO Safety Checklist Details Patient Name: Date of Service: [name], [name] [name]. 12/28/2022 10:00 A M Medical Record Number: [id number] Patient Account Number: [id number] Date of Birth/Sex: Treating RN: May 28, 1956 (67 y.o. [name] Primary Care [name]: [name] Other Clinician: Valeria [name] Referring [name]: Treating [name]/Extender: [name] in Treatment: 15 HBO Safety Checklist Items Safety Checklist Consent Form Signed Patient voided / foley secured and emptied When did you last eato 0330 Last dose of  injectable or oral agent 0335 Ostomy pouch emptied and vented if applicable NA All implantable devices assessed, documented and approved NA Intravenous access site secured and place NA Valuables secured Linens and cotton and cotton/polyester blend (less than 51% polyester) Personal oil-based products / skin lotions / body lotions removed Wigs or hairpieces removed NA Smoking or tobacco materials removed NA Books / newspapers / magazines / loose paper removed Cologne, aftershave, perfume and deodorant removed Jewelry removed (may wrap wedding band) Make-up removed NA Hair care products removed Battery operated devices (external) removed Heating patches and chemical warmers removed Titanium eyewear removed Nail polish cured greater than 10 hours NA Casting material cured greater than 10 hours NA Hearing aids removed NA Loose dentures or partials removed dentures removed Prosthetics have been removed NA Patient demonstrates correct use of air break device (if applicable) Patient concerns have been addressed Patient grounding bracelet on and cord attached to chamber Specifics for Inpatients (complete in addition to above) Medication sheet sent with patient NA Intravenous medications needed or due during therapy sent with patient NA Drainage tubes (e.g. nasogastric tube or chest tube secured and vented) NA Endotracheal or Tracheotomy tube secured NA Cuff deflated of air and inflated with saline NA Airway suctioned NA Notes Paper version used prior to treatment. Electronic Signature(s) Signed: 12/28/2022 1:31:28 PM By: [name] CHT EMT BS , , Entered By: [name] on 12/28/2022 13:31:27

## 2022-12-29 ENCOUNTER — Encounter (HOSPITAL_BASED_OUTPATIENT_CLINIC_OR_DEPARTMENT_OTHER): Payer: Medicare Other | Admitting: General Surgery

## 2022-12-29 DIAGNOSIS — E11621 Type 2 diabetes mellitus with foot ulcer: Secondary | ICD-10-CM | POA: Diagnosis not present

## 2022-12-29 LAB — GLUCOSE, CAPILLARY
Glucose-Capillary: 230 mg/dL — ABNORMAL HIGH (ref 70–99)
Glucose-Capillary: 169 mg/dL — ABNORMAL HIGH (ref 70–99)

## 2022-12-29 NOTE — Progress Notes (Signed)
KAIS, MONJE R ([id number]) [id number] Page 1 of 2 Visit Report for 12/29/2022 HBO Details Patient Name: Date of Service: [place], [name] 12/29/2022 1:00 PM Medical Record Number: [id number] Patient Account Number: [id number] Date of Birth/Sex: Treating RN: Jul 07, 1956 (67 y.o. 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 HBO Treatment Details Treatment Number: 33 Patient Type: Outpatient Chamber Type: Monoplace Chamber Serial #: [id number] Treatment Protocol: 2.0 ATA with 90 minutes oxygen, with two 5 minute air breaks Treatment Details Compression Rate Down: 2.0 psi / minute De-Compression Rate Up: 2.0 psi / minute A breaks and breathing ir Compress Tx Pressure periods Decompress Decompress Begins Reached (leave unused spaces Begins Ends blank) Chamber Pressure (ATA 1 2 2 2 2 2  --2 1 ) Clock Time (24 hr) 13:34 13:45 14:16 14:21 14:26 14:56 - - 15:26 15:37 Treatment Length: 123 (minutes) Treatment Segments: 4 Vital Signs Capillary Blood Glucose Reference Range: 80 - 120 mg / dl HBO Diabetic Blood Glucose Intervention Range: <131 mg/dl or >249 mg/dl Time Vitals Blood Respiratory Capillary Blood Glucose Pulse Action Type: Pulse: Temperature: Taken: Pressure: Rate: Glucose (mg/dl): Meter #: Oximetry (%) Taken: Pre 13:07 230 Post 15:45 106/75 71 16 97.2 169 Treatment Response Treatment Toleration: Well Treatment Completion Status: Treatment Completed without Adverse Event Physician HBO Attestation: I certify that I supervised this HBO treatment in accordance with  Medicare guidelines. A trained emergency response team is readily available per Yes hospital policies and procedures. Continue HBOT as ordered. Yes Electronic Signature(s) Signed: 12/29/2022 4:33:31 PM By: Fredirick Maudlin MD FACS Previous Signature: 12/29/2022 4:25:53 PM Version By: Valeria Batman EMT Entered By: Fredirick Maudlin on 12/29/2022 16:33:31 HBO Safety Checklist Details -------------------------------------------------------------------------------- [name] ([id number]) [id number] Page 2 of 2 Patient Name: Date of Service: [place], [name] 12/29/2022 1:00 PM Medical Record Number: [id number] Patient Account Number: [id number] Date of Birth/Sex: Treating RN: November 01, 1956 (67 y.o. Mare Ferrari Primary Care Elverta Dimiceli: Nelda Bucks Other Clinician: Valeria Batman Referring Jodie Leiner: Treating Haiden Clucas/Extender: Doristine Bosworth in Treatment: 15 HBO Safety Checklist Items Safety Checklist Consent Form Signed Patient voided / foley secured and emptied When did you last eato 1045 Last dose of injectable or oral agent 0900 Ostomy pouch emptied and vented if applicable NA All implantable devices assessed, documented and approved NA Intravenous access site secured and place NA Valuables secured Linens and cotton and cotton/polyester blend (less than 51% polyester) Personal oil-based products / skin lotions / body lotions removed Wigs or hairpieces removed NA Smoking or tobacco materials removed Books / newspapers / magazines / loose paper removed Cologne, aftershave, perfume and deodorant removed Jewelry removed (may wrap wedding band) Make-up removed NA Hair care products removed Battery operated devices (external) removed Heating patches and chemical warmers removed Titanium eyewear removed NA Nail polish cured greater than 10 hours NA Casting material cured greater than 10 hours NA Hearing aids  removed NA Loose dentures or partials removed removed by patient Prosthetics have been removed NA Patient demonstrates correct use of air break device (if applicable) Patient concerns have been addressed Patient grounding bracelet on and cord attached to chamber Specifics for Inpatients (complete in addition to above) Medication sheet sent with patient NA Intravenous medications needed or due  during therapy sent with patient NA Drainage tubes (e.g. nasogastric tube or chest tube secured and vented) NA Endotracheal or Tracheotomy tube secured NA Cuff deflated of air and inflated with saline NA Airway suctioned NA Notes The safety checklist was done before the treatment was started. Patient stated that he had Peanut Butter toast at 1045. Also stated that he took "7 units of fast and 70 units of slow insulin today "at 0900. Electronic Signature(s) Signed: 12/29/2022 4:24:40 PM By: Valeria Batman EMT Entered By: Valeria Batman on 12/29/2022 16:24:39

## 2022-12-29 NOTE — Progress Notes (Signed)
THELTON, GRACA ([id number]) [id number] Page 1 of 2 Visit Report for 12/29/2022 Problem List Details Patient Name: Date of Service: [place], [name] 12/29/2022 1:00 PM Medical Record Number: [id number] Patient Account Number: [id number] Date of Birth/Sex: Treating RN: 1956/03/22 (67 y.o. 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 L97.523 Non-pressure chronic ulcer of other part of left foot with 09/10/2022 No Yes necrosis of muscle L72.3 Sebaceous cyst 12/17/2022 No Yes U44.034 Other chronic osteomyelitis, left ankle and foot 09/10/2022 No Yes E11.65 Type 2 diabetes mellitus with hyperglycemia 09/10/2022 No Yes E11.621 Type 2 diabetes mellitus with foot ulcer 09/10/2022 No Yes Inactive Problems Resolved Problems Electronic Signature(s) Signed: 12/29/2022 4:26:31 PM By: Valeria Batman EMT Signed: 12/29/2022 4:33:07 PM By: Fredirick Maudlin MD FACS Entered By: Valeria Batman on 12/29/2022 16:26:31 -------------------------------------------------------------------------------- SuperBill Details Patient Name: Date of Service: [place], [name] 12/29/2022 Medical Record Number: [id number] Patient Account Number: [id number] Date of Birth/Sex: Treating RN: 11-07-1956 (67 y.o. 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 L72.3 Sebaceous cyst 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, left ankle and foot E11.65 Type 2 diabetes mellitus with hyperglycemia E11.621 Type 2 diabetes mellitus with foot ulcer Facility Procedures : The patient participates with Medicare or their insurance follows the Medicare Facility Guidelines: CPT4 Code Description Modifier Quantity [id number] G0277-(Facility Use Only) HBOT full body chamber, 1min , 4 ICD-10 Diagnosis Description [phone] Other  chronic osteomyelitis, 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 2 diabetes mellitus with foot  ulcer Physician Procedures : CPT4 Code Description Modifier [phone] [phone] - WC PHYS HYPERBARIC OXYGEN THERAPY ICD-10 Diagnosis Description 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, left ankle and foot L97.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

## 2022-12-29 NOTE — Progress Notes (Signed)
[name], [name] ([id number]) [id number] Page 1 of 2 Visit Report for 12/29/2022 Arrival Information Details Patient Name: Date of Service: [place], [name]. 12/29/2022 1:00 PM Medical Record Number: [id number] Patient Account Number: [id number] Date of Birth/Sex: Treating RN: 22-Sep-1956 (67 y.o. Joylene Igo, Morey Hummingbird Primary Care Sherrilynn Gudgel: Nelda Bucks Other Clinician: Valeria Batman Referring Corneshia Hines: Treating Aeris Hersman/Extender: Doristine Bosworth in Treatment: 15 Visit Information History Since Last Visit All ordered tests and consults were completed: Yes Patient Arrived: Knee Scooter Added or deleted any medications: No Arrival Time: 12:56 Any new allergies or adverse reactions: No Accompanied By: None Had a fall or experienced change in No Transfer Assistance: None activities of daily living that may affect Patient Identification Verified: Yes risk of falls: Secondary Verification Process Completed: Yes Signs or symptoms of abuse/neglect since last visito No Patient Requires Transmission-Based Precautions: No Hospitalized since last visit: No Patient Has Alerts: No Implantable device outside of the clinic excluding No cellular tissue based products placed in the center since last visit: Pain Present Now: No Electronic Signature(s) Signed: 12/29/2022 4:19:46 PM By: Valeria Batman EMT Entered By: Valeria Batman on 12/29/2022 16:19:45 -------------------------------------------------------------------------------- Encounter Discharge Information Details Patient Name: Date of Service: [name], [name] 12/29/2022 1:00 PM Medical Record Number: [id number] Patient Account Number: [id number] Date of Birth/Sex: Treating RN: 1956/12/12 (67 y.o. Mare Ferrari Primary Care Hillary Schwegler: Nelda Bucks Other Clinician: Valeria Batman Referring Ciaira Natividad: Treating Jamiah Homeyer/Extender: Doristine Bosworth in  Treatment: 15 Encounter Discharge Information Items Discharge Condition: Stable Ambulatory Status: Knee Scooter Discharge Destination: Home Transportation: Private Auto Accompanied By: None Schedule Follow-up Appointment: Yes Clinical Summary of Care: Electronic Signature(s) Signed: 12/29/2022 4:26:55 PM By: Valeria Batman EMT Entered By: Valeria Batman on 12/29/2022 16:26:55 [name] ([id number]) [id number] Page 2 of 2 -------------------------------------------------------------------------------- Vitals Details Patient Name: Date of Service: [name], [name]. 12/29/2022 1:00 PM Medical Record Number: [id number] Patient Account Number: [id number] Date of Birth/Sex: Treating RN: [birth year] (67 y.o. Mare Ferrari Primary Care Mieczyslaw Stamas: Nelda Bucks Other Clinician: Valeria Batman Referring Nori Poland: Treating Danta Baumgardner/Extender: Doristine Bosworth in Treatment: 15 Vital Signs Time Taken: 13:07 Capillary Blood Glucose (mg/dl): 230 Height (in): 74 Reference Range: 80 - 120 mg / dl Weight (lbs): 245 Body Mass Index (BMI): 31.5 Electronic Signature(s) Signed: 12/29/2022 4:20:14 PM By: Valeria Batman EMT Entered By: Valeria Batman on 12/29/2022 16:20:13

## 2022-12-30 ENCOUNTER — Encounter (HOSPITAL_BASED_OUTPATIENT_CLINIC_OR_DEPARTMENT_OTHER): Payer: Medicare Other | Admitting: General Surgery

## 2022-12-30 DIAGNOSIS — E11621 Type 2 diabetes mellitus with foot ulcer: Secondary | ICD-10-CM | POA: Diagnosis not present

## 2022-12-30 LAB — GLUCOSE, CAPILLARY
Glucose-Capillary: 247 mg/dL — ABNORMAL HIGH (ref 70–99)
Glucose-Capillary: 192 mg/dL — ABNORMAL HIGH (ref 70–99)
Glucose-Capillary: 168 mg/dL — ABNORMAL HIGH (ref 70–99)

## 2022-12-30 NOTE — Progress Notes (Signed)
DREVIN, ORTNER R ([id number]) [id number] Page 1 of 1 Visit Report for 12/30/2022 SuperBill Details Patient Name: Date of Service: [name], [name] [name] 12/30/2022 Medical Record Number: [id number] Patient Account Number: [id number] Date of Birth/Sex: Treating RN: 28-Oct-1956 (67 y.o. 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

## 2022-12-30 NOTE — Progress Notes (Signed)
[name], [name] ([id number]) [id number] Page 1 of 2 Visit Report for 12/30/2022 Arrival Information Details Patient Name: Date of Service: [place], [name] 12/30/2022 10:00 A M Medical Record Number: [id number] Patient Account Number: [id number] Date of Birth/Sex: Treating RN: 1956/12/05 (67 y.o. [name], Meta.Reding Primary Care Krishawna Stiefel: Nelda Bucks Other Clinician: Donavan Burnet Referring Jaevion Goto: Treating Almond Fitzgibbon/Extender: Doristine Bosworth in Treatment: 15 Visit Information History Since Last Visit All ordered tests and consults were completed: Yes Patient Arrived: Knee Scooter Added or deleted any medications: No Arrival Time: 10:06 Any new allergies or adverse reactions: No Accompanied By: self Had a fall or experienced change in No Transfer Assistance: None activities of daily living that may affect Patient Identification Verified: Yes risk of falls: Secondary Verification Process Completed: Yes Signs or symptoms of abuse/neglect since last visito No Patient Requires Transmission-Based Precautions: No Hospitalized since last visit: No Patient Has Alerts: No Implantable device outside of the clinic excluding No cellular tissue based products placed in the center since last visit: Pain Present Now: No Electronic Signature(s) Signed: 12/30/2022 2:02:34 PM By: Donavan Burnet CHT EMT BS , , Entered By: Donavan Burnet on 12/30/2022 14:02:34 -------------------------------------------------------------------------------- Encounter Discharge Information Details Patient Name: Date of Service: [place], [name] 12/30/2022 10:00 A M Medical Record Number: [id number] Patient Account Number: [id number] Date of Birth/Sex: Treating RN: 15-May-1956 (67 y.o. [name] Primary Care Nazaiah Navarrete: Nelda Bucks Other Clinician: Donavan Burnet Referring Shara Hartis: Treating Rhemi Balbach/Extender: Doristine Bosworth in Treatment: 15 Encounter Discharge Information Items Discharge Condition: Stable Ambulatory Status: Ambulatory Discharge Destination: Home Transportation: Private Auto Accompanied By: self Schedule Follow-up Appointment: No Clinical Summary of Care: Electronic Signature(s) Signed: 12/30/2022 2:10:32 PM By: Donavan Burnet CHT EMT BS , , Entered By: Donavan Burnet on 12/30/2022 14:10:32 [name] [name] ([id number]) [id number] Page 2 of 2 -------------------------------------------------------------------------------- Vitals Details Patient Name: Date of Service: [name], [name] 12/30/2022 10:00 A M Medical Record Number: [id number] Patient Account Number: [id number] Date of Birth/Sex: Treating RN: 1956/07/15 (67 y.o. [name], Meta.Reding Primary Care Mirabella Hilario: Nelda Bucks Other Clinician: Donavan Burnet Referring Axiel Fjeld: Treating Kaylum Shrum/Extender: Doristine Bosworth in Treatment: 15 Vital Signs Time Taken: 11:11 Temperature (F): 98.8 Height (in): 74 Pulse (bpm): 78 Weight (lbs): 245 Respiratory Rate (breaths/min): 18 Body Mass Index (BMI): 31.5 Blood Pressure (mmHg): 107/72 Capillary Blood Glucose (mg/dl): 192 Reference Range: 80 - 120 mg / dl Electronic Signature(s) Signed: 12/30/2022 2:03:05 PM By: Donavan Burnet CHT EMT BS , , Entered By: Donavan Burnet on 12/30/2022 14:03:05

## 2022-12-30 NOTE — Progress Notes (Addendum)
BURECH, MCFARLAND R ([id number]) [id number] Page 1 of 2 Visit Report for 12/30/2022 HBO Details Patient Name: Date of Service: [name], [name] 12/30/2022 10:00 A M Medical Record Number: [id number] Patient Account Number: [id number] Date of Birth/Sex: Treating RN: 01-12-1956 (67 y.o. 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 HBO Treatment Details Treatment Number: 34 Patient Type: Outpatient Chamber Type: Monoplace Chamber Serial #: [id number] Treatment Protocol: 2.0 ATA with 90 minutes oxygen, with two 5 minute air breaks Treatment Details Compression Rate Down: 1.0 psi / minute De-Compression Rate Up: 1.5 psi / minute A breaks and breathing ir Compress Tx Pressure periods Decompress Decompress Begins Reached (leave unused spaces Begins Ends blank) Chamber Pressure (ATA 1 2 2 2 2 2  --2 1 ) Clock Time (24 hr) 11:10 11:30 12:01 12:06 12:36 12:41 - - 13:11 13:25 Treatment Length: 135 (minutes) Treatment Segments: 4 Vital Signs Capillary Blood Glucose Reference Range: 80 - 120 mg / dl HBO Diabetic Blood Glucose Intervention Range: <131 mg/dl or >249 mg/dl Time Vitals Blood Respiratory Capillary Blood Glucose Pulse Action Type: Pulse: Temperature: Taken: Pressure: Rate: Glucose (mg/dl): Meter #: Oximetry (%) Taken: Pre 11:11 107/72 78 18 98.8 192 1 Post 13:28 130/78 71 18 97.9 168 1 Pre 10:16 247 1 Treatment Response Treatment Toleration: Well Treatment Completion Status: Treatment Completed without Adverse Event Treatment Notes Patient arrived, blood glucose 247 mg/dL at 1016.  Patient had a nurse visit for wound vac after preparing for treatment. Blood glucose measured again at 1111 with a reading of 192 mg/dL. Mr. Haywood tolerated compression of the chamber at 1 psi/min until reaching 2 ATA at which time rate set was increased to 1.5 psi/min after confirming no issues with ear equalization. Patient tolerated treatment and decompression of the chamber at 1.5 psi/min. Post treatment glucose reading was 168 mg/dL. Patient was stable upon discharge. Physician HBO Attestation: I certify that I supervised this HBO treatment in accordance with Medicare guidelines. A trained emergency response team is readily available per Yes hospital policies and procedures. Continue HBOT as ordered. Yes Electronic Signature(s) Signed: 12/31/2022 7:37:06 AM By: Fredirick Maudlin MD FACS Previous Signature: 12/30/2022 2:09:04 PM Version By: Donavan Burnet CHT EMT BS , , Entered By: Fredirick Maudlin on 12/31/2022 07:37:05 [name] ([id number]) [id number] Page 2 of 2 -------------------------------------------------------------------------------- HBO Safety Checklist Details Patient Name: Date of Service: [place], [name] 12/30/2022 10:00 A M Medical Record Number: [id number] Patient Account Number: [id number] Date of Birth/Sex: Treating RN: 1956-05-20 (67 y.o. [name], Meta.Reding Primary Care Benjaman Artman: Nelda Bucks Other Clinician: Donavan Burnet Referring Mehreen Azizi: Treating Nihaal Friesen/Extender: Doristine Bosworth in Treatment: 15 HBO Safety Checklist Items Safety Checklist Consent Form Signed Patient voided / foley secured and emptied When did you last eato last night Last dose of injectable or oral agent 0658 80 units Tresiba, 8 units novolog Ostomy pouch emptied and vented if applicable NA All implantable devices assessed, documented and approved NA Intravenous access site secured and place NA Valuables secured Linens  and cotton and cotton/polyester blend (less than 51% polyester) Personal oil-based products / skin lotions / body lotions removed Wigs or hairpieces removed NA Smoking or tobacco materials removed NA Books / newspapers / magazines / loose  paper removed Cologne, aftershave, perfume and deodorant removed Jewelry removed (may wrap wedding band) Make-up removed NA Hair care products removed Battery operated devices (external) removed Heating patches and chemical warmers removed Titanium eyewear removed Nail polish cured greater than 10 hours NA Casting material cured greater than 10 hours NA Hearing aids removed NA Loose dentures or partials removed dentures removed Prosthetics have been removed NA Patient demonstrates correct use of air break device (if applicable) Patient concerns have been addressed Patient grounding bracelet on and cord attached to chamber Specifics for Inpatients (complete in addition to above) Medication sheet sent with patient NA Intravenous medications needed or due during therapy sent with patient NA Drainage tubes (e.g. nasogastric tube or chest tube secured and vented) NA Endotracheal or Tracheotomy tube secured NA Cuff deflated of air and inflated with saline NA Airway suctioned NA Notes Paper version used prior to treatment. Electronic Signature(s) Signed: 12/30/2022 2:04:34 PM By: Donavan Burnet CHT EMT BS , , Entered By: Donavan Burnet on 12/30/2022 14:04:34

## 2022-12-30 NOTE — Progress Notes (Addendum)
ISIDOR, BROMELL R ([id number]) [id number] Page 1 of 6 Visit Report for 12/30/2022 Arrival Information Details Patient Name: Date of Service: [name], [name] 12/30/2022 12:30 PM Medical Record Number: [id number] Patient Account Number: [id number] Date of Birth/Sex: Treating RN: [birth year] (67 y.o. Dwaine Deter, Jamie Primary Care Takari Lundahl: Foye [name] Other Clinician: Referring Shean Gerding: Treating Wilburn Keir/Extender: Kandis Cocking in Treatment: 15 Visit Information History Since Last Visit Added or deleted any medications: No Patient Arrived: Knee Scooter Any new allergies or adverse reactions: No Arrival Time: 11:06 Had a fall or experienced change in No Accompanied By: Self activities of daily living that may affect Transfer Assistance: None risk of falls: Patient Identification Verified: Yes Signs or symptoms of abuse/neglect since last visito No Secondary Verification Process Completed: Yes Hospitalized since last visit: No Patient Requires Transmission-Based Precautions: No Implantable device outside of the clinic excluding No Patient Has Alerts: No cellular tissue based products placed in the center since last visit: Has Dressing in Place as Prescribed: Yes Pain Present Now: No Electronic Signature(s) Signed: 12/30/2022 11:07:08 AM By: Tommie Ard RN Entered By: Tommie Ard on 12/30/2022 11:07:08 -------------------------------------------------------------------------------- Encounter Discharge Information Details Patient Name: Date of Service: [place], [name] 12/30/2022 12:30 PM Medical Record Number: [id number] Patient Account Number: [id number] Date of Birth/Sex: Treating RN: 02/05/1956 (67 y.o. [name] Primary Care Enis Riecke: Foye [name] Other Clinician: Referring Cheyenne Schumm: Treating Wynetta Seith/Extender: Kandis Cocking in Treatment: 15 Encounter Discharge Information  Items Discharge Condition: Stable Ambulatory Status: Knee Scooter Discharge Destination: Home Transportation: Private Auto Accompanied By: self Schedule Follow-up Appointment: Yes Clinical Summary of Care: Electronic Signature(s) Signed: 12/30/2022 11:10:17 AM By: Tommie Ard RN Entered By: Tommie Ard on 12/30/2022 11:10:17 [name] ([id number]) [id number] Page 2 of 6 -------------------------------------------------------------------------------- Negative Pressure Wound Therapy Maintenance (NPWT) Details Patient Name: Date of Service: [place], [name] 12/30/2022 12:30 PM Medical Record Number: [id number] Patient Account Number: [id number] Date of Birth/Sex: Treating RN: Oct 24, 1956 (67 y.o. [name] Primary Care Alaysia Lightle: Foye [name] Other Clinician: Referring Meiling Hendriks: Treating Ernesto Zukowski/Extender: Kandis Cocking in Treatment: 15 NPWT Maintenance Performed for: Wound #1 Left Calcaneus Additional Injuries Covered: No Performed By: Tommie Ard, RN Type: VAC System Coverage Size (sq cm): 27.28 Pressure Type: Constant Pressure Setting: 125 mmHG Drain Type: None Primary Contact: Non-Adherent Sponge/Dressing Type: Foam, Black Date Initiated: 12/27/2022 Dressing Removed: No Quantity of Sponges/Gauze Removed: 1 Canister Changed: No Canister Exudate Volume: 0 Dressing Reapplied: No Quantity of Sponges/Gauze Inserted: 1 Respones T Treatment: o tolerated well Days On NPWT : 4 Electronic Signature(s) Signed: 12/30/2022 11:09:32 AM By: Tommie Ard RN Entered By: Tommie Ard on 12/30/2022 11:09:31 -------------------------------------------------------------------------------- Patient/Caregiver Education Details Patient Name: Date of Service: [name] NA LD R. 1/18/2024andnbsp12:30 PM Medical Record Number: [id number] Patient Account Number: [id number] Date of Birth/Gender: Treating RN: [birth year] (67  y.o. [name] Primary Care Physician: Foye [name] Other Clinician: Referring Physician: Treating Physician/Extender: Kandis Cocking in Treatment: 15 Education Assessment Education Provided To: Patient Education Topics Provided Wound Debridement: Methods: Explain/Verbal Responses: Reinforcements needed, State content correctly Wound/Skin Impairment: Methods: Explain/Verbal Responses: Reinforcements needed, State content correctly Electronic Signature(s) Signed: 12/30/2022 4:47:13 PM By: Tommie Ard RN [name], Lavance R ([id number]) [id number] Page 3 of 6 Entered By: Tommie Ard on 12/30/2022 11:10:00 -------------------------------------------------------------------------------- Wound Assessment Details Patient Name: Date of Service: MYLO, CHOI [name] 12/30/2022 12:30 PM Medical Record Number: [id number] Patient Account Number: [id number] Date of Birth/Sex:  Treating RN: 1956/09/17 (67 y.o. [name] Primary Care Phillips Goulette: Nelda Bucks Other Clinician: Referring Jacklyn Branan: Treating Sandie Swayze/Extender: Doristine Bosworth in Treatment: 15 Wound Status Wound Number: 1 Primary Etiology: Diabetic Wound/Ulcer of the Lower Extremity Wound Location: Left Calcaneus Wound Status: Open Wounding Event: Pressure Injury Date Acquired: 03/09/2019 Weeks Of Treatment: 15 Clustered Wound: No Wound Measurements Length: (cm) 6.2 Width: (cm) 4.4 Depth: (cm) 1.6 Area: (cm) 21.426 Volume: (cm) 34.281 % Reduction in Area: 0.8% % Reduction in Volume: -58.7% Wound Description Classification: Grade 3 Exudate Amount: Large Exudate Type: Serosanguineous Exudate Color: red, brown Periwound Skin Texture Texture Color No Abnormalities Noted: No No Abnormalities Noted: No Moisture No Abnormalities Noted: No Treatment Notes Wound #1 (Calcaneus) Wound Laterality: Left Cleanser Normal  Saline Discharge Instruction: Cleanse the wound with Normal Saline prior to applying a clean dressing using gauze sponges, not tissue or cotton balls. Soap and Water Discharge Instruction: May shower and wash wound with dial antibacterial soap and water prior to dressing change. Peri-Wound Care keystone Topical Primary Dressing Secondary Dressing Secured With Coban Self-Adherent Wrap 4x5 (in/yd) Discharge Instruction: Secure with Coban as directed. Kerlix Roll Sterile, 4.5x3.1 (in/yd) Discharge Instruction: Secure with Kerlix as directed. Compression Wrap Compression Stockings DAHMIR, EPPERLY ([id number]) [id number] Page 4 of 6 Add-Ons Electronic Signature(s) Signed: 12/30/2022 4:47:13 PM By: Blanche East RN Entered By: Blanche East on 12/30/2022 11:08:16 -------------------------------------------------------------------------------- Wound Assessment Details Patient Name: Date of Service: [place], [name] 12/30/2022 12:30 PM Medical Record Number: [id number] Patient Account Number: [id number] Date of Birth/Sex: Treating RN: December 02, 1956 (67 y.o. [name] Primary Care Tamrah Victorino: Nelda Bucks Other Clinician: Referring Lyris Hitchman: Treating Beckham Capistran/Extender: Doristine Bosworth in Treatment: 15 Wound Status Wound Number: 2 Primary Etiology: Diabetic Wound/Ulcer of the Lower Extremity Wound Location: Left, Dorsal Foot Wound Status: Open Wounding Event: Pressure Injury Date Acquired: 07/09/2022 Weeks Of Treatment: 15 Clustered Wound: No Wound Measurements Length: (cm) 2.7 Width: (cm) 3.4 Depth: (cm) 0.1 Area: (cm) 7.21 Volume: (cm) 0.721 % Reduction in Area: 23.5% % Reduction in Volume: 23.5% Wound Description Classification: Grade 2 Exudate Amount: Medium Exudate Type: Serosanguineous Exudate Color: red, brown Periwound Skin Texture Texture Color No Abnormalities Noted: No No Abnormalities Noted:  No Moisture No Abnormalities Noted: No Treatment Notes Wound #2 (Foot) Wound Laterality: Dorsal, Left Cleanser Normal Saline Discharge Instruction: Cleanse the wound with Normal Saline prior to applying a clean dressing using gauze sponges, not tissue or cotton balls. Soap and Water Discharge Instruction: May shower and wash wound with dial antibacterial soap and water prior to dressing change. Peri-Wound Care keystone Topical Primary Dressing Promogran Prisma Matrix, 4.34 (sq in) (silver collagen) Discharge Instruction: Moisten collagen with hydrogel Secondary Dressing Zetuvit Plus Silicone Border Dressing 4x4 (in/in) Discharge Instruction: Apply silicone border over primary dressing as directed. KUE, FOX R ([id number]) [id number] Page 5 of 6 Secured With [employer] 4x5 (in/yd) Discharge Instruction: Secure with Coban as directed. Kerlix Roll Sterile, 4.5x3.1 (in/yd) Discharge Instruction: Secure with Kerlix as directed. Compression Wrap Compression Stockings Add-Ons Electronic Signature(s) Signed: 12/30/2022 4:47:13 PM By: Blanche East RN Entered By: Blanche East on 12/30/2022 11:08:16 -------------------------------------------------------------------------------- Wound Assessment Details Patient Name: Date of Service: [place], [name] 12/30/2022 12:30 PM Medical Record Number: [id number] Patient Account Number: [id number] Date of Birth/Sex: Treating RN: [birth year] (67 y.o. [name] Primary Care Hodge Stachnik: Nelda Bucks Other Clinician: Referring Elea Holtzclaw: Treating Malene Blaydes/Extender: Doristine Bosworth in Treatment: 15 Wound Status Wound Number:  3 Primary Etiology: Cyst Wound Location: Right Shoulder Wound Status: Open Wounding Event: Gradually Appeared Date Acquired: 12/14/2022 Weeks Of Treatment: 1 Clustered Wound: No Wound Measurements Length: (cm) 0.9 Width: (cm) 0.2 Depth: (cm)  1.7 Area: (cm) 0.141 Volume: (cm) 0.24 % Reduction in Area: -11.9% % Reduction in Volume: -860% Wound Description Classification: Full Thickness Without Exposed Suppor Exudate Amount: Medium Exudate Type: Purulent Exudate Color: yellow, brown, green t Structures Periwound Skin Texture Texture Color No Abnormalities Noted: No No Abnormalities Noted: No Moisture No Abnormalities Noted: No Treatment Notes Wound #3 (Shoulder) Wound Laterality: Right Cleanser Soap and Water Discharge Instruction: May shower and wash wound with dial antibacterial soap and water prior to dressing change. Wound Cleanser Discharge Instruction: Cleanse the wound with wound cleanser prior to applying a clean dressing using gauze sponges, not tissue or cotton balls. Peri-Wound Care Skin Prep NITHIN, DEMEO ([id number]) [phone].pdf Page 6 of 6 Discharge Instruction: Use skin prep as directed Topical Primary Dressing Iodoform packing strip 1/4 (in) Discharge Instruction: Lightly pack as instructed Secondary Dressing Zetuvit Plus Silicone Border Dressing 4x4 (in/in) Discharge Instruction: Apply silicone border over primary dressing as directed. Secured With Compression Wrap Compression Stockings Environmental education officer) Signed: 12/30/2022 4:47:13 PM By: Blanche East RN Entered By: Blanche East on 12/30/2022 11:08:16 -------------------------------------------------------------------------------- Vitals Details Patient Name: Date of Service: [name], [name] 12/30/2022 12:30 PM Medical Record Number: [id number] Patient Account Number: [id number] Date of Birth/Sex: Treating RN: May 19, 1956 (67 y.o. [name] Primary Care Izayiah Tibbitts: Nelda Bucks Other Clinician: Referring Nelvin Tomb: Treating Lamaria Hildebrandt/Extender: Doristine Bosworth in Treatment: 15 Vital Signs Time Taken: 11:07 Temperature (F): 98.5 Height (in): 74 Pulse (bpm):  73 Weight (lbs): 245 Respiratory Rate (breaths/min): 20 Body Mass Index (BMI): 31.5 Blood Pressure (mmHg): 107/72 Capillary Blood Glucose (mg/dl): 247 Reference Range: 80 - 120 mg / dl Electronic Signature(s) Signed: 12/30/2022 11:07:51 AM By: Blanche East RN Entered By: Blanche East on 12/30/2022 11:07:51

## 2022-12-31 ENCOUNTER — Encounter (HOSPITAL_BASED_OUTPATIENT_CLINIC_OR_DEPARTMENT_OTHER): Payer: Medicare Other | Admitting: General Surgery

## 2022-12-31 NOTE — Progress Notes (Signed)
DONN, ZANETTI R ([id number]) [id number] Page 1 of 1 Visit Report for 12/30/2022 SuperBill Details Patient Name: Date of Service: [name], [name] [name] 12/30/2022 Medical Record Number: [id number] Patient Account Number: [id number] Date of Birth/Sex: Treating RN: 1956/11/21 (67 y.o. 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, left ankle and foot E11.65 Type 2 diabetes mellitus with hyperglycemia E11.621 Type 2 diabetes mellitus with foot ulcer Facility Procedures The patient participates with Medicare or their insurance follows the Medicare Facility Guidelines CPT4 Code Description Modifier Quantity [id number] G0277-(Facility Use Only) HBOT full body chamber, 67min , 4 ICD-10 Diagnosis Description [phone] Other chronic osteomyelitis, 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 Physician Procedures Quantity CPT4 Code Description Modifier [id number] 90300 - WC PHYS HYPERBARIC OXYGEN THERAPY 1 ICD-10 Diagnosis Description 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, 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

## 2023-01-03 ENCOUNTER — Encounter (HOSPITAL_BASED_OUTPATIENT_CLINIC_OR_DEPARTMENT_OTHER): Payer: Medicare Other | Admitting: General Surgery

## 2023-01-03 DIAGNOSIS — E11621 Type 2 diabetes mellitus with foot ulcer: Secondary | ICD-10-CM | POA: Diagnosis not present

## 2023-01-03 LAB — GLUCOSE, CAPILLARY
Glucose-Capillary: 123 mg/dL — ABNORMAL HIGH (ref 70–99)
Glucose-Capillary: 137 mg/dL — ABNORMAL HIGH (ref 70–99)
Glucose-Capillary: 161 mg/dL — ABNORMAL HIGH (ref 70–99)

## 2023-01-03 NOTE — Progress Notes (Signed)
ABOU, STERKEL R ([id number]) [id number] Page 1 of 1 Visit Report for 01/03/2023 SuperBill Details Patient Name: Date of Service: [name], [name] [name] 01/03/2023 Medical Record Number: [id number] Patient Account Number: [id number] Date of Birth/Sex: Treating RN: [birth year] (67 y.o. 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.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 L98.492 Non-pressure chronic ulcer of skin of other sites with fat layer exposed L72.3 Sebaceous cyst S06.301 Other chronic osteomyelitis, left ankle and foot E11.65 Type 2 diabetes mellitus with hyperglycemia E11.621 Type 2 diabetes mellitus with foot ulcer Facility Procedures The patient participates with Medicare or their insurance follows the Medicare Facility Guidelines CPT4 Code Description Modifier Quantity [id number] G0277-(Facility Use Only) HBOT full body chamber, 65min , 4 ICD-10 Diagnosis Description [phone] Other chronic osteomyelitis, 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 Physician Procedures Quantity CPT4 Code Description Modifier [id number] - WC PHYS HYPERBARIC OXYGEN THERAPY 1 ICD-10 Diagnosis Description 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, 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

## 2023-01-03 NOTE — Progress Notes (Signed)
SAMIR, ISHAQ ([id number]) [id number] Page 1 of 13 Visit Report for 01/03/2023 Chief Complaint Document Details Patient Name: Date of Service: [name], [name] [name] 01/03/2023 8:15 A M Medical Record Number: [id number] Patient Account Number: [id number] Date of Birth/Sex: Treating RN: Feb 20, 1956 (67 y.o. M) Primary Care Provider: Foye [name] Other Clinician: Referring Provider: Treating Provider/Extender: Kandis Cocking in Treatment: 16 Information Obtained from: Patient Chief Complaint Patients presents for treatment of an open diabetic ulcer and evaluation for hyperbaric oxygen therapy Electronic Signature(s) Signed: 01/03/2023 9:25:48 AM By: [name] MD FACS Entered By: [name] on 01/03/2023 09:25:48 -------------------------------------------------------------------------------- Debridement Details Patient Name: Date of Service: [name], [name]. 01/03/2023 8:15 A M Medical Record Number: [id number] Patient Account Number: [id number] Date of Birth/Sex: Treating RN: [birth year] (67 y.o. [name] Primary Care Provider: Foye [name] Other Clinician: Referring Provider: Treating Provider/Extender: Kandis Cocking in Treatment: 16 Debridement Performed for Assessment: Wound #2 Left,Dorsal Foot Performed By: Physician [name], MD Debridement Type: Debridement Severity of Tissue Pre Debridement: Fat layer exposed Level of Consciousness (Pre-procedure): Awake and Alert Pre-procedure Verification/Time Out Yes - 08:50 Taken: Start Time: 08:50 Pain Control: Lidocaine 4% T opical Solution T Area Debrided (L x W): otal 3.5 (cm) x 3.4 (cm) = 11.9 (cm) Tissue and other material debrided: Viable, Non-Viable, Slough, Subcutaneous, Slough Level: Skin/Subcutaneous Tissue Debridement Description: Excisional Instrument: Curette Bleeding: Minimum Hemostasis Achieved:  Pressure Procedural Pain: 0 Post Procedural Pain: 0 Response to Treatment: Procedure was tolerated well Level of Consciousness (Post- Awake and Alert procedure): Post Debridement Measurements of Total Wound Length: (cm) 3.5 Width: (cm) 3.4 Depth: (cm) 0.1 Volume: (cm) 0.935 Character of Wound/Ulcer Post Debridement: Improved Severity of Tissue Post Debridement: Fat layer exposed [name], [name] [name] ([id number]) [id number] Page 2 of 13 Post Procedure Diagnosis Same as Pre-procedure Notes scribed for Dr. Lady Gary by Zenaida Deed, RN Electronic Signature(s) Signed: 01/03/2023 11:23:06 AM By: [name] MD FACS Signed: 01/03/2023 5:34:39 PM By: Zenaida Deed RN, BSN Entered By: Zenaida Deed on 01/03/2023 08:52:37 -------------------------------------------------------------------------------- Debridement Details Patient Name: Date of Service: [name], [name]. 01/03/2023 8:15 A M Medical Record Number: [id number] Patient Account Number: [id number] Date of Birth/Sex: Treating RN: 05-27-1956 (67 y.o. [name] Primary Care Provider: Foye [name] Other Clinician: Referring Provider: Treating Provider/Extender: Kandis Cocking in Treatment: 16 Debridement Performed for Assessment: Wound #1 Left Calcaneus Performed By: Physician [name], MD Debridement Type: Debridement Severity of Tissue Pre Debridement: Necrosis of bone Level of Consciousness (Pre-procedure): Awake and Alert Pre-procedure Verification/Time Out Yes - 08:50 Taken: Start Time: 08:50 Pain Control: Lidocaine 4% T opical Solution T Area Debrided (L x W): otal 6 (cm) x 4.5 (cm) = 27 (cm) Tissue and other material debrided: Viable, Non-Viable, Callus, Slough, Subcutaneous, Skin: Epidermis, Slough Level: Skin/Subcutaneous Tissue Debridement Description: Excisional Instrument: Curette Bleeding: Minimum Hemostasis Achieved:  Pressure Procedural Pain: 0 Post Procedural Pain: 0 Response to Treatment: Procedure was tolerated well Level of Consciousness (Post- Awake and Alert procedure): Post Debridement Measurements of Total Wound Length: (cm) 6 Width: (cm) 4.5 Depth: (cm) 1.4 Volume: (cm) 29.688 Character of Wound/Ulcer Post Debridement: Improved Severity of Tissue Post Debridement: Necrosis of bone Post Procedure Diagnosis Same as Pre-procedure Notes scribed for Dr. Lady Gary by Zenaida Deed, RN Electronic Signature(s) Signed: 01/03/2023 11:23:06 AM By: [name] MD FACS Signed: 01/03/2023 5:34:39 PM By: Zenaida Deed RN, BSN Entered By: Zenaida Deed on 01/03/2023 08:53:43 [name], [name] ([id number])  [phone] [id number] Page 3 of 13 -------------------------------------------------------------------------------- HPI Details Patient Name: Date of Service: [name], [name]. 01/03/2023 8:15 A M Medical Record Number: [id number] Patient Account Number: [id number] Date of Birth/Sex: Treating RN: 02-04-1956 (67 y.o. M) Primary Care Provider: Foye DeerSchultz, Douglas Other Clinician: Referring Provider: Treating Provider/Extender: Kandis Cockingannon, Sonam Wandel Schultz, Douglas Weeks in Treatment: 16 History of Present Illness HPI Description: ADMISSION 09/10/2022 This is a 67 year old poorly controlled type II diabetic (last hemoglobin A1c 10.8%) who has had an ulcer on his heel for over 3 years. He has been seen in multiple wound care centers, including [facility]. He reports that at least 3 doctors have recommended that he undergo below-knee amputation. He most recently met with Dr. Reuel BoomMatt Goldman, a [facility]. 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 Unfortunately, the patient has some extenuating social circumstances including the fact that he cares for his wife  who has stage IV colon cancer and still works, driving vehicles for [employer]. 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 He was recently hospitalized at [facility] for sepsis and currently has a PICC line through which he receives IV antibiotics. He reports having had another MRI during that hospital stay along with a chest x-ray and EKG. He apparently contacted one of the hyperbaric therapy techs here and asked a number of questions about hyperbaric oxygen treatments. He subsequently self-referred to our center to undergo further evaluation and management. I mention to him that [facility], but he [name] that [place] in [place] and this would be more convenient for him given the intensive nature of the therapy and time requirement. ABI in clinic today was 0.94. The patient actually has 2 wounds. There is a wound on the dorsum of his left foot with heavy black eschar and slough present. After debridement, this was demonstrated to involve the muscle and the extensor tendons are exposed. On his heel, there is essentially a "shark bite" type wound, with much of the heel fat pad absent. The muscle layer is exposed. There is blue-green staining around the perimeter of the wound, but no significant odor. He says he has been applying collagen to the wound on his heel and Silvadene and Betadine to the wound on his dorsal foot. 09/20/2022: The heel wound is quite macerated with wet periwound callus. There is slough accumulation on the surface. The dorsal foot wound looks better this week. There is still exposed tendon, but it is fairly clean with just a little biofilm buildup. We are still working on gathering the required documentation to submit for pretreatment review for hyperbaric oxygen therapy. 09/29/2022: The dorsal foot wound continues to improve. I do not see any exposed tendon at this point. There is just some slough accumulation  on the wound surface. He continues to have very wet macerated periwound callus on his heel. There is slough on the surface, but it is loose and thin. There is an area of undermining at the 11 o'clock position, but the overlying skin and subcutaneous tissue is healthy and viable. He has been approved for hyperbaric oxygen therapy and will start treatment tomorrow. 10/06/2022: Continued contraction and improvement of the dorsal foot wound. There is just a little bit of slough accumulation on the wound surface. The periwound callus continues to accumulate on the heel wound and it is quite macerated. It is also  persistently found with blue-green staining present. He did initiate his hyperbaric oxygen therapy, but has had significant difficulty with decompression. He will be going to ENT to have PE tubes placed. 10/18/2022: His hyperbaric oxygen therapy is on hold while his otological issues are being addressed. He came in again today with the periwound skin on both the dorsal aspect of his foot and his calcaneus completely macerated. The blue-green discoloration, however, has abated and the undermining on the calcaneus has improved. The dorsal foot wound has also contracted somewhat. 10/26/2022: The dorsal foot wound continues to contract and fill with good granulation tissue. The heel, once again, has a rim of macerated callus, but the undermining and tunneling continues to contract. He has completed his oral antibiotics and has been using the [facility] topical compounded antibiotic for his dressing changes at home. He is scheduled to see ENT this afternoon. 11/08/2022: The dorsal foot wound is flush with the surrounding skin and has a good granulation tissue surface. There is some slough accumulation. As usual, the heel has a rim of macerated callus but the dimensions are smaller and the tunneling and undermining have contracted further. He has resumed his hyperbaric oxygen therapy. 12/12; patient seen  for wound evaluation. He is tolerating HBO although we could not dive him yesterday because of relative hypoglycemia and the fact he had given himself NovoLog insulin before he came to clinic. Today his blood sugar is in the 180 range she should be fine. He has a large wound on the plantar calcaneus on the right and a more superficial area on the dorsal foot. He is using a scooter for offloading 11/30/2022: The dorsal foot wound continues to contract. There is some slough on the wound surface. The large calcaneus wound has heaped up wet callus around the margins. Continued undermining. 12/08/2022: The dorsal foot wound is flat and flush with the surrounding skin surface. There is some slough present. Once again, the calcaneal wound has thick, absolutely macerated callus hanging off in tatters around the edges. The patient cannot explain to me why this part of his wound gets so wet. There is slough on the surface. The undermined portion of the wound has filled in. 12/17/2022: Earlier this week, he presented for his hyperbaric oxygen therapy and was hypotensive and ill-appearing. He also had what appeared to be an infected sebaceous cyst on his right upper arm. He was sent to the emergency department. He was given a fluid bolus and the ED provider lanced the cyst. He was prescribed doxycycline. He seems to be feeling better today. He notes that he has had copious drainage from his foot. On further questioning, he [name] that he has not been taking his oral diuretic. The dorsal foot wound has expanded somewhat, but is actually more superficial. The plantar heel wound is about the same. 12/27/2022: The dorsal foot wound has epithelialized further. The plantar foot wound is about the same size but has heaped up macerated callus around the perimeter. The intake nurse noted a tiny fragment of bone on his dressings and there is now an area at about the 5 o'clock position where 1 can palpate bone through a small  slit in the soft tissues. The wound on his arm is contracting but he still likely has cyst wall present due to the manner in which the infected sebaceous cyst was addressed in the ER. 01/01/2023: The wound on his shoulder has contracted considerably. The dorsal foot wound has some slough on the surface but also  looks to be improving. The plantar foot wound is basically unchanged. [name], [name] ([id number]) [id number] Page 4 of 13 Electronic Signature(s) Signed: 01/03/2023 9:28:32 AM By: Duanne Guessannon, Jadalee Westcott MD FACS Entered By: Duanne Guessannon, Seferino Oscar on 01/03/2023 09:28:31 -------------------------------------------------------------------------------- Physical Exam Details Patient Name: Date of Service: [place], [name]. 01/03/2023 8:15 A M Medical Record Number: [id number] Patient Account Number: [id number] Date of Birth/Sex: Treating RN: 1956-02-20 22(66 y.o. M) Primary Care Provider: Foye DeerSchultz, Douglas Other Clinician: Referring Provider: Treating Provider/Extender: Kandis Cockingannon, Madaleine Simmon Schultz, Douglas Weeks in Treatment: 16 Constitutional . . . . no acute distress. Respiratory Normal work of breathing on room air. Notes 01/01/2023: The wound on his shoulder has contracted considerably. The dorsal foot wound has some slough on the surface but also looks to be improving. The plantar foot wound is basically unchanged. Electronic Signature(s) Signed: 01/03/2023 9:29:00 AM By: Duanne Guessannon, Jahni Nazar MD FACS Entered By: Duanne Guessannon, Zia Kanner on 01/03/2023 09:29:00 -------------------------------------------------------------------------------- Physician Orders Details Patient Name: Date of Service: [place]ITH, [name] 01/03/2023 8:15 A M Medical Record Number: [id number] Patient Account Number: [id number] Date of Birth/Sex: Treating RN: 1956-02-20 79(66 y.o. 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 - Dr. Lady Garyannon Rm 4 Return A Monday 1/29 @ 0815 am Anesthetic (In clinic) Topical Lidocaine 5% applied to wound bed (In clinic) Topical Lidocaine 4% applied to wound bed Cellular or Tissue [address] Qu[place]ley, Tray [name] ([id number]) [id number] Page 5 of 13 Other Cellular or Tissue Based Products Orders/Instructions: - run insurance for [employer] Bathing/ Shower/ Hygiene May shower with protection but do not get wound dressing(s) wet. Protect dressing(s) with water repellant cover (for example, large plastic bag) or a cast cover and may then take shower. Negative Presssure Wound Therapy Wound Vac to wound continuously at 16325mm/hg pressure - to left calcaneus Black Foam Edema Control - Lymphedema / SCD / Other Left Lower Extremity Avoid standing for long periods of time. Off-Loading Wound #1 Left Calcaneus Other: - use knee scooter to ambulate, minimal weight bearing right foot Home Health Dressing changes to be completed by Home Health on Monday / Wednesday / Friday except when [facility]. Other Home Health Orders/Instructions: - [facility] Hyperbaric Oxygen Therapy Wound #1 Left Calcaneus Evaluate for HBO Therapy Indication: - chronic refractory osteomyelitis If appropriate for treatment, begin HBOT per protocol: 2.5 ATA for 90 Minutes with 2 Five (5) Minute A Breaks ir Total Number of Treatments: -  40 One treatments per day (delivered Monday through Friday unless otherwise specified in Special Instructions below): Finger stick Blood Glucose Pre- and Post- HBOT Treatment. Follow Hyperbaric Oxygen Glycemia Protocol A frin (Oxymetazoline HCL) 0.05% nasal spray - 1 spray in both nostrils daily as needed prior to HBO treatment for difficulty clearing ears Wound Treatment Wound #1 - Calcaneus Wound Laterality: Left Cleanser: Normal Saline 3 x Per Week/30 Days Discharge Instructions: Cleanse the wound with Normal Saline prior to applying a clean dressing using gauze sponges, not tissue or cotton balls. Cleanser: Soap  and Water 3 x Per Week/30 Days Discharge Instructions: May shower and wash wound with dial antibacterial soap and water prior to dressing change. Peri-Wound Care: keystone 3 x Per Week/30 Days Prim Dressing: VAC ary 3 x Per Week/30 Days Secured With: Coban Self-Adherent Wrap 4x5 (in/yd) 3 x Per Week/30 Days Discharge Instructions: Secure with Coban as directed. Secured With: [employer], 4.5x3.1 (in/yd) 3 x Per Week/30 Days Discharge Instructions: Secure with Kerlix as directed. Wound #2 - Foot Wound Laterality: Dorsal, Left Cleanser: Normal Saline Discharge Instructions: Cleanse the wound with Normal Saline prior to applying a clean dressing using gauze sponges, not tissue or cotton balls. Cleanser: Soap and Water Discharge Instructions: May shower and wash wound with dial antibacterial soap and water prior to dressing change. Peri-Wound Care: keystone Prim Dressing: Promogran Prisma Matrix, 4.34 (sq in) (silver collagen) ary Discharge Instructions: Moisten collagen with hydrogel Secondary Dressing: Zetuvit Plus Silicone Border Dressing 4x4 (in/in) Discharge Instructions: Apply silicone border over primary dressing as directed. Secured With: Coban Self-Adherent Wrap 4x5 (in/yd) Discharge Instructions: Secure with Coban as directed. Secured With: [employer], 4.5x3.1 (in/yd) Discharge Instructions: Secure with Kerlix as directed. Wound #3 - Shoulder Wound Laterality: Right Cleanser: Soap and Water 1 x Per Day/30 Days Discharge Instructions: May shower and wash wound with dial antibacterial soap and water prior to dressing change. [name], RYBACKI ([id number]) [id number] Page 6 of 13 Cleanser: Wound Cleanser 1 x Per Day/30 Days Discharge Instructions: Cleanse the wound with wound cleanser prior to applying a clean dressing using gauze sponges, not tissue or cotton balls. Peri-Wound Care: Skin Prep 1 x Per Day/30 Days Discharge Instructions: Use skin prep as directed Prim Dressing: Iodoform packing strip 1/4 (in) 1 x Per Day/30 Days ary Discharge Instructions: Lightly pack as instructed Secondary Dressing: Zetuvit Plus Silicone Border Dressing 4x4 (in/in) 1 x Per Day/30 Days Discharge Instructions: Apply silicone border over primary dressing as directed. Patient Medications llergies: bee venom protein (honey bee) A Notifications Medication Indication Start End prior to debridement 01/03/2023 lidocaine DOSE topical 4 % cream - cream topical GLYCEMIA INTERVENTIONS PROTOCOL PRE-HBO GLYCEMIA INTERVENTIONS ACTION INTERVENTION Obtain pre-HBO capillary blood glucose (ensure 1 physician order is in chart). A. Notify HBO physician and await physician orders. 2 If result is 70 mg/dl or below: B. If the result meets the hospital definition of a critical result, follow hospital policy. A. Give patient an 8 ounce Glucerna Shake, an 8 ounce Ensure, or 8 ounces of a Glucerna/Ensure equivalent dietary supplement*. B. Wait 30 minutes. If result is 71 mg/dl to 130 mg/dl: C. Retest patients capillary blood glucose (CBG). D. If result greater than or equal to 110 mg/dl, proceed with HBO. If result less than 110 mg/dl, notify HBO physician and consider holding HBO. If result is 131 mg/dl to 249 mg/dl: A. Proceed  with HBO. A. Notify HBO physician and await physician orders. B. It is recommended to hold HBO and do If result is 250 mg/dl or greater: blood/urine ketone testing. C. If the result meets the hospital definition of a critical result, follow hospital policy. POST-HBO GLYCEMIA INTERVENTIONS ACTION INTERVENTION Obtain post HBO capillary blood glucose (ensure 1 physician order is in chart). A. Notify HBO physician and await physician orders. 2 If result is 70 mg/dl or below: B. If the result meets the hospital definition of a critical result, follow hospital policy. A. Give patient an 8 ounce Glucerna Shake, an 8 ounce Ensure, or 8 ounces of a Glucerna/Ensure equivalent dietary  supplement*. B. Wait 15 minutes for symptoms of If result is 71 mg/dl to 016 mg/dl: hypoglycemia (i.e. nervousness, anxiety, sweating, chills, clamminess, irritability, confusion, tachycardia or dizziness). C. If patient asymptomatic, discharge patient. If patient symptomatic, repeat capillary blood glucose (CBG) and notify HBO physician. If result is 101 mg/dl to 010 mg/dl: A. Discharge patient. A. Notify HBO physician and await physician orders. B. It is recommended to do blood/urine ketone If result is 250 mg/dl or greater: testing. C. If the result meets the hospital definition of a critical result, follow hospital policy. *Juice or candies are NOT equivalent products. If patient refuses the Glucerna or Ensure, please consult the hospital dietitian for an appropriate substitute. Electronic Signature(s) Signed: 01/03/2023 11:23:06 AM By: [name] MD FACS Entered By: [name] on 01/03/2023 09:29:13 [name], [name] ([id number]) [id number] Page 7 of 13 -------------------------------------------------------------------------------- Problem List Details Patient Name: Date of Service: [name], [name]. 01/03/2023 8:15 A M Medical Record Number:  [id number] Patient Account Number: [id number] Date of Birth/Sex: Treating RN: [birth year] (67 y.o. 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 09/10/2022 No Yes L97.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 09/10/2022 No Yes L98.492 Non-pressure chronic ulcer of skin of other sites with fat layer exposed 12/17/2022 No Yes L72.3 Sebaceous cyst 12/17/2022 No Yes C37.628 Other chronic osteomyelitis, left ankle and foot 09/10/2022 No Yes E11.65 Type 2 diabetes mellitus with hyperglycemia 09/10/2022 No Yes E11.621 Type 2 diabetes mellitus with foot ulcer 09/10/2022 No Yes Inactive Problems Resolved Problems Electronic Signature(s) Signed: 01/03/2023 9:25:33 AM By: [name] MD FACS Entered By: [name] on 01/03/2023 09:25:33 -------------------------------------------------------------------------------- Progress Note Details Patient Name: Date of Service: [name], [name]. 01/03/2023 8:15 A M Medical Record Number: [id number] Patient Account Number: [id number] Date of Birth/Sex: Treating RN: 1956-01-26 (67 y.o. M) Primary Care Provider: Foye [name] Other Clinician: Referring Provider: Treating Provider/Extender: Kayleen Memos [place], Windy Fast [name] ([id number]) [id number] Page 8 of 13 Weeks in Treatment: 16 Subjective Chief Complaint Information obtained from Patient Patients presents for treatment of an open diabetic ulcer and evaluation for hyperbaric oxygen therapy History of Present Illness (HPI) ADMISSION 09/10/2022 This is a 67 year old poorly controlled type II diabetic (last hemoglobin A1c  10.8%) who has had an ulcer on his heel for over 3 years. He has been seen in multiple wound care centers, including [facility] and [facility]. He reports that at least 3 doctors have recommended that he undergo below-knee amputation. He most recently met with Dr. Reuel Boom, a vascular surgeon affiliated with [facility] 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 Unfortunately, the patient has some extenuating social circumstances including the fact that he cares for his wife who has stage IV colon cancer and still works, driving vehicles for [employer]. 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 He was recently hospitalized at [facility] for sepsis and currently has a PICC line through which he receives IV antibiotics. He reports having had another MRI during that hospital stay along with a chest x-ray and EKG. He apparently contacted one of the hyperbaric therapy techs here and asked a number of questions about  hyperbaric oxygen treatments. He subsequently self-referred to our center to undergo further evaluation and management. I mention to him that [facility] actually has hyperbaric chambers, but he [name] that he lives in [place] and this would be more convenient for him given the intensive nature of the therapy and time requirement. ABI in clinic today was 0.94. The patient actually has 2 wounds. There is a wound on the dorsum of his left foot with heavy black eschar and slough present. After debridement, this was demonstrated to involve the muscle and the extensor tendons are exposed. On his heel, there is essentially a "shark bite" type wound, with much of the heel fat pad absent. The muscle layer is exposed. There is blue-green staining around the perimeter of the wound, but no significant odor. He says he has been applying collagen to the wound on his heel and Silvadene  and Betadine to the wound on his dorsal foot. 09/20/2022: The heel wound is quite macerated with wet periwound callus. There is slough accumulation on the surface. The dorsal foot wound looks better this week. There is still exposed tendon, but it is fairly clean with just a little biofilm buildup. We are still working on gathering the required documentation to submit for pretreatment review for hyperbaric oxygen therapy. 09/29/2022: The dorsal foot wound continues to improve. I do not see any exposed tendon at this point. There is just some slough accumulation on the wound surface. He continues to have very wet macerated periwound callus on his heel. There is slough on the surface, but it is loose and thin. There is an area of undermining at the 11 o'clock position, but the overlying skin and subcutaneous tissue is healthy and viable. He has been approved for hyperbaric oxygen therapy and will start treatment tomorrow. 10/06/2022: Continued contraction and improvement of the dorsal foot wound. There is just a little bit of slough accumulation on the wound surface. The periwound callus continues to accumulate on the heel wound and it is quite macerated. It is also persistently found with blue-green staining present. He did initiate his hyperbaric oxygen therapy, but has had significant difficulty with decompression. He will be going to ENT to have PE tubes placed. 10/18/2022: His hyperbaric oxygen therapy is on hold while his otological issues are being addressed. He came in again today with the periwound skin on both the dorsal aspect of his foot and his calcaneus completely macerated. The blue-green discoloration, however, has abated and the undermining on the calcaneus has improved. The dorsal foot wound has also contracted somewhat. 10/26/2022: The dorsal foot wound continues to contract and fill with good granulation tissue. The heel, once again, has a rim of macerated callus, but the undermining  and tunneling continues to contract. He has completed his oral antibiotics and has been using the [facility] topical compounded antibiotic for his dressing changes at home. He is scheduled to see ENT this afternoon. 11/08/2022: The dorsal foot wound is flush with the surrounding skin and has a good granulation tissue surface. There is some slough accumulation. As usual, the heel has a rim of macerated callus but the dimensions are smaller and the tunneling and undermining have contracted further. He has resumed his hyperbaric oxygen therapy. 12/12; patient seen for wound evaluation. He is tolerating HBO although we could not dive him yesterday because of relative hypoglycemia and the fact he had given himself NovoLog insulin before he came to clinic. Today his blood sugar is in the 180 range she should be  fine. He has a large wound on the plantar calcaneus on the right and a more superficial area on the dorsal foot. He is using a scooter for offloading 11/30/2022: The dorsal foot wound continues to contract. There is some slough on the wound surface. The large calcaneus wound has heaped up wet callus around the margins. Continued undermining. 12/08/2022: The dorsal foot wound is flat and flush with the surrounding skin surface. There is some slough present. Once again, the calcaneal wound has thick, absolutely macerated callus hanging off in tatters around the edges. The patient cannot explain to me why this part of his wound gets so wet. There is slough on the surface. The undermined portion of the wound has filled in. 12/17/2022: Earlier this week, he presented for his hyperbaric oxygen therapy and was hypotensive and ill-appearing. He also had what appeared to be an infected sebaceous cyst on his right upper arm. He was sent to the emergency department. He was given a fluid bolus and the ED provider lanced the cyst. He was prescribed doxycycline. He seems to be feeling better today. He notes that he  has had copious drainage from his foot. On further questioning, he [name] that he has not been taking his oral diuretic. The dorsal foot wound has expanded somewhat, but is actually more superficial. The plantar heel wound is about the same. 12/27/2022: The dorsal foot wound has epithelialized further. The plantar foot wound is about the same size but has heaped up macerated callus around the perimeter. The intake nurse noted a tiny fragment of bone on his dressings and there is now an area at about the 5 o'clock position where 1 can palpate bone through a small slit in the soft tissues. The wound on his arm is contracting but he still likely has cyst wall present due to the manner in which the infected sebaceous cyst was addressed in the ER. 01/01/2023: The wound on his shoulder has contracted considerably. The dorsal foot wound has some slough on the surface but also looks to be improving. The plantar foot wound is basically unchanged. Patient History Family History Cancer - Father,Mother, Diabetes - Mother,Father, Lung Disease - Father, Thyroid Problems - Mother, No family history of Heart Disease, Hereditary Spherocytosis, Hypertension, Kidney Disease, Seizures, Stroke, Tuberculosis. Social History Never smoker, Marital Status - Married, Alcohol Use - Never, Drug Use - No History, Caffeine Use - Daily. Medical History Eyes Patient has history of Cataracts [name], [name] ([id number]) [id number] Page 9 of 13 Denies history of Glaucoma, Optic Neuritis Ear/Nose/Mouth/Throat Denies history of Chronic sinus problems/congestion, Middle ear problems Respiratory Patient has history of Sleep Apnea Cardiovascular Patient has history of Hypertension Gastrointestinal Denies history of Cirrhosis , Colitis, Crohnoos, Hepatitis A, Hepatitis B, Hepatitis C Endocrine Patient has history of Type II Diabetes Immunological Denies history of Lupus Erythematosus, Raynaudoos,  Scleroderma Musculoskeletal Patient has history of Osteomyelitis - 2023 Neurologic Patient has history of Neuropathy - Bila lower extremities Oncologic Denies history of Received Chemotherapy, Received Radiation Psychiatric Denies history of Anorexia/bulimia, Confinement Anxiety Hospitalization/Surgery History - I and D Left calcaneus. - back surgery- laminectomy. - eye surgery- Bila cataracts. - shoulder arthroscopy. Medical A Surgical History Notes nd Cardiovascular hyperlipidemia Genitourinary AKI Objective Constitutional no acute distress. Vitals Time Taken: 8:17 AM, Height: 74 in, Weight: 245 lbs, BMI: 31.5, Temperature: 97.7 F, Pulse: 94 bpm, Respiratory Rate: 20 breaths/min, Blood Pressure: 138/73 mmHg, Capillary Blood Glucose: 218 mg/dl. General Notes: glucose per pt report this am Respiratory Normal work  of breathing on room air. General Notes: 01/01/2023: The wound on his shoulder has contracted considerably. The dorsal foot wound has some slough on the surface but also looks to be improving. The plantar foot wound is basically unchanged. Integumentary (Hair, Skin) Wound #1 status is Open. Original cause of wound was Pressure Injury. The date acquired was: 03/09/2019. The wound has been in treatment 16 weeks. The wound is located on the Left Calcaneus. The wound measures 6cm length x 4.5cm width x 1.4cm depth; 21.206cm^2 area and 29.688cm^3 volume. There is bone and Fat Layer (Subcutaneous Tissue) exposed. There is no tunneling or undermining noted. There is a large amount of serosanguineous drainage noted. The wound margin is thickened. There is large (67-100%) red, pale granulation within the wound bed. There is a small (1-33%) amount of necrotic tissue within the wound bed including Adherent Slough. The periwound skin appearance had no abnormalities noted for color. The periwound skin appearance exhibited: Callus, Maceration. Periwound temperature was noted as No  Abnormality. Wound #2 status is Open. Original cause of wound was Pressure Injury. The date acquired was: 07/09/2022. The wound has been in treatment 16 weeks. The wound is located on the Left,Dorsal Foot. The wound measures 3.5cm length x 3.4cm width x 0.1cm depth; 9.346cm^2 area and 0.935cm^3 volume. There is Fat Layer (Subcutaneous Tissue) exposed. There is no tunneling or undermining noted. There is a medium amount of serosanguineous drainage noted. The wound margin is flat and intact. There is small (1-33%) red granulation within the wound bed. There is a large (67-100%) amount of necrotic tissue within the wound bed including Adherent Slough. The periwound skin appearance had no abnormalities noted for moisture. The periwound skin appearance had no abnormalities noted for color. The periwound skin appearance exhibited: Scarring. Periwound temperature was noted as No Abnormality. Wound #3 status is Open. Original cause of wound was Gradually Appeared. The date acquired was: 12/14/2022. The wound has been in treatment 2 weeks. The wound is located on the Right Shoulder. The wound measures 0.3cm length x 0.2cm width x 1.1cm depth; 0.047cm^2 area and 0.052cm^3 volume. There is Fat Layer (Subcutaneous Tissue) exposed. There is no tunneling or undermining noted. There is a medium amount of serosanguineous drainage noted. The wound margin is distinct with the outline attached to the wound base. There is no granulation within the wound bed. There is no necrotic tissue within the wound bed. The periwound skin appearance had no abnormalities noted for moisture. The periwound skin appearance had no abnormalities noted for color. The periwound skin appearance exhibited: Rash. Periwound temperature was noted as No Abnormality. 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 There was a Excisional Skin/Subcutaneous Tissue Debridement with a total area of 27 sq cm performed by Duanne Guessannon, Diogo Anne, MD. With the following instrument(s): Curette to remove Viable and Non-Viable tissue/material. Material removed includes Callus, Subcutaneous Tissue, Slough, and Skin: Epidermis after achieving pain control using Lidocaine 4% Topical Solution. No  specimens were taken. A time out was conducted at 08:50, prior to the start of the procedure. A Minimum amount of bleeding was controlled with Pressure. The procedure was tolerated well with a pain level of 0 throughout and a pain level of 0 following the procedure. Post Debridement Measurements: 6cm length x 4.5cm width x 1.4cm depth; 29.688cm^3 volume. Character of Wound/Ulcer Post Debridement is improved. Severity of Tissue Post Debridement is: Necrosis of bone. Post procedure Diagnosis Wound #1: Same as Pre-Procedure General Notes: scribed for Dr. Celine Ahr by Baruch Gouty, RN. 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 There was a Excisional Skin/Subcutaneous Tissue Debridement with a total area of 11.9 sq cm performed by Fredirick Maudlin, MD. With the following instrument(s): Curette to remove Viable and Non-Viable tissue/material. Material removed includes Subcutaneous Tissue and Slough  and after achieving pain control using Lidocaine 4% Topical Solution. No specimens were taken. A time out was conducted at 08:50, prior to the start of the procedure. A Minimum amount of bleeding was controlled with Pressure. The procedure was tolerated well with a pain level of 0 throughout and a pain level of 0 following the procedure. Post Debridement Measurements: 3.5cm length x 3.4cm width x 0.1cm depth; 0.935cm^3 volume. Character of Wound/Ulcer Post Debridement is improved. Severity of Tissue Post Debridement is: Fat layer exposed. Post procedure Diagnosis Wound #2: Same as Pre-Procedure General Notes: scribed for Dr. Celine Ahr by Baruch Gouty, RN. Plan Follow-up Appointments: Return Appointment in 1 week. - Dr. Celine Ahr Rm 4 Monday 1/29 @ 0815 am Anesthetic: (In clinic) Topical Lidocaine 5% applied to wound bed (In clinic) Topical Lidocaine 4% applied to wound bed Cellular or Tissue Based Products: Other Cellular or Tissue Based Products Orders/Instructions: - run insurance for Apligraf Bathing/ Shower/ Hygiene: May shower with protection but do not get wound dressing(s) wet. Protect dressing(s) with water repellant cover (for example, large plastic bag) or a cast cover and may then take shower. Negative Presssure Wound Therapy: Wound Vac to wound continuously at 157mm/hg pressure - to left calcaneus Black Foam Edema Control - Lymphedema / SCD / Other: Avoid standing for long periods of time. Off-Loading: Wound #1 Left Calcaneus: Other: - use knee scooter to ambulate, minimal weight bearing right foot Home Health: Dressing changes to be completed by [facility] on Monday / Wednesday / Friday except when patient has scheduled visit at [facility]. Other Home Health Orders/Instructions: - [facility] Hyperbaric Oxygen Therapy: Wound #1 Left Calcaneus: Evaluate for HBO Therapy Indication: - chronic refractory osteomyelitis If appropriate for treatment, begin HBOT per  protocol: 2.5 ATA for 90 Minutes with 2 Five (5) Minute Air Breaks T Number of Treatments: - 40 otal One treatments per day (delivered Monday through Friday unless otherwise specified in Special Instructions below): Finger stick Blood Glucose Pre- and Post- HBOT Treatment. Follow Hyperbaric Oxygen Glycemia Protocol Afrin (Oxymetazoline HCL) 0.05% nasal spray - 1 spray in both nostrils daily as needed prior to HBO treatment for difficulty clearing ears The following medication(s) was prescribed: lidocaine topical 4 % cream cream topical for prior to debridement was prescribed at facility WOUND #1: - Calcaneus Wound Laterality: Left Cleanser: Normal Saline 3 x Per Week/30 Days Discharge Instructions: Cleanse the wound with Normal Saline prior to applying a clean dressing using gauze sponges, not tissue or cotton balls. Cleanser: Soap and Water 3 x Per Week/30 Days Discharge  Instructions: May shower and wash wound with dial antibacterial soap and water prior to dressing change. Peri-Wound Care: keystone 3 x Per Week/30 Days Prim Dressing: VAC 3 x Per Week/30 Days ary Secured With: Coban Self-Adherent Wrap 4x5 (in/yd) 3 x Per Week/30 Days Discharge Instructions: Secure with Coban as directed. Secured With: [employer], 4.5x3.1 (in/yd) 3 x Per Week/30 Days Discharge Instructions: Secure with Kerlix as directed. WOUND #2: - Foot Wound Laterality: Dorsal, Left Cleanser: Normal Saline Discharge Instructions: Cleanse the wound with Normal Saline prior to applying a clean dressing using gauze sponges, not tissue or cotton balls. Cleanser: Soap and Water Discharge Instructions: May shower and wash wound with dial antibacterial soap and water prior to dressing change. BOAZ, BERISHA ([id number]) [id number] Page 11 of 13 Peri-Wound Care: keystone Prim Dressing: Promogran Prisma Matrix, 4.34 (sq in) (silver collagen) ary Discharge Instructions: Moisten collagen  with hydrogel Secondary Dressing: Zetuvit Plus Silicone Border Dressing 4x4 (in/in) Discharge Instructions: Apply silicone border over primary dressing as directed. Secured With: Coban Self-Adherent Wrap 4x5 (in/yd) Discharge Instructions: Secure with Coban as directed. Secured With: [employer], 4.5x3.1 (in/yd) Discharge Instructions: Secure with Kerlix as directed. WOUND #3: - Shoulder Wound Laterality: Right Cleanser: Soap and Water 1 x Per Day/30 Days Discharge Instructions: May shower and wash wound with dial antibacterial soap and water prior to dressing change. Cleanser: Wound Cleanser 1 x Per Day/30 Days Discharge Instructions: Cleanse the wound with wound cleanser prior to applying a clean dressing using gauze sponges, not tissue or cotton balls. Peri-Wound Care: Skin Prep 1 x Per Day/30 Days Discharge Instructions: Use skin prep as directed Prim Dressing: Iodoform packing strip 1/4 (in) 1 x Per Day/30 Days ary Discharge Instructions: Lightly pack as instructed Secondary Dressing: Zetuvit Plus Silicone Border Dressing 4x4 (in/in) 1 x Per Day/30 Days Discharge Instructions: Apply silicone border over primary dressing as directed. 01/01/2023: The wound on his shoulder has contracted considerably. The dorsal foot wound has some slough on the surface but also looks to be improving. The plantar foot wound is basically unchanged. I used a curette to debride slough from the dorsal foot wound along with some nonviable subcutaneous tissue. I debrided callus, slough, and nonviable subcutaneous tissue from the calcaneal wound. No debridement was necessary for the shoulder wound. We will continue to pack the shoulder with iodoform packing strips. Continue Prisma silver collagen to the dorsal foot wound. We will add some periwound zinc oxide to minimize tissue maceration. We will continue negative pressure wound therapy on the calcaneus. We are running his insurance for Apligraf. He will  also continue hyperbaric oxygen therapy. Follow-up in 1 week. Electronic Signature(s) Signed: 01/03/2023 9:30:36 AM By: [name] MD FACS Entered By: [name] on 01/03/2023 09:30:36 -------------------------------------------------------------------------------- HxROS Details Patient Name: Date of Service: [name], [name]. 01/03/2023 8:15 A M Medical Record Number: [id number] Patient Account Number: [id number] Date of Birth/Sex: Treating RN: May 11, 1956 (67 y.o. M) Primary Care Provider: Foye [name] Other Clinician: Referring Provider: Treating Provider/Extender: Kandis Cocking in Treatment: 16 Eyes Medical History: Positive for: Cataracts Negative for: Glaucoma; Optic Neuritis Ear/Nose/Mouth/Throat Medical History: Negative for: Chronic sinus problems/congestion; Middle ear problems Respiratory Medical History: Positive for: Sleep Apnea Cardiovascular Medical History: Positive for: Hypertension Past Medical History Notes: hyperlipidemia Gastrointestinal Medical HistoryMarland Kitchen AMARRI, SATTERLY ([id number]) [id number] Page 12 of 13 Negative for: Cirrhosis ; Colitis; Crohns; Hepatitis A; Hepatitis B; Hepatitis C Endocrine Medical History: Positive for: Type II Diabetes Time with diabetes:  20 years Treated with: Insulin Blood sugar tested every day: Yes Tested : 2-3 Genitourinary Medical History: Past Medical History Notes: AKI Immunological Medical History: Negative for: Lupus Erythematosus; Raynauds; Scleroderma Musculoskeletal Medical History: Positive for: Osteomyelitis - 2023 Neurologic Medical History: Positive for: Neuropathy - Bila lower extremities Oncologic Medical History: Negative for: Received Chemotherapy; Received Radiation Psychiatric Medical History: Negative for: Anorexia/bulimia; Confinement Anxiety HBO Extended History Items Eyes: Cataracts Immunizations Pneumococcal  Vaccine: Received Pneumococcal Vaccination: Yes Received Pneumococcal Vaccination On or After 60th Birthday: Yes Implantable Devices Yes Hospitalization / Surgery History Type of Hospitalization/Surgery I and D Left calcaneus back surgery- laminectomy eye surgery- Bila cataracts shoulder arthroscopy Family and Social History Cancer: Yes - Father,Mother; Diabetes: Yes - Mother,Father; Heart Disease: No; Hereditary Spherocytosis: No; Hypertension: No; Kidney Disease: No; Lung Disease: Yes - Father; Seizures: No; Stroke: No; Thyroid Problems: Yes - Mother; Tuberculosis: No; Never smoker; Marital Status - Married; Alcohol Use: Never; Drug Use: No History; Caffeine Use: Daily; Financial Concerns: No; Food, Clothing or Shelter Needs: No; Support System Lacking: No; Transportation Concerns: No Electronic Signature(s) Signed: 01/03/2023 11:23:06 AM By: [name] MD FACS Entered By: [name] on 01/03/2023 09:28:38 KEADEN, GUNNOE [name] ([id number]) [id number] Page 13 of 13 -------------------------------------------------------------------------------- SuperBill Details Patient Name: Date of Service: JERICHO, ALCORN [name] 01/03/2023 Medical Record Number: [id number] Patient Account Number: [id number] Date of Birth/Sex: Treating RN: 05/22/1956 (67 y.o. 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.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 L98.492 Non-pressure chronic ulcer of skin of other sites with fat layer exposed L72.3 Sebaceous cyst Q46.962 Other chronic osteomyelitis, left ankle and foot E11.65 Type 2 diabetes mellitus with hyperglycemia E11.621 Type 2 diabetes  mellitus with foot ulcer Facility Procedures : The patient participates with Medicare or their insurance follows the Medicare Facility Guidelines: CPT4 Code Description Modifier Quantity [id number] 11042 - DEB SUBQ TISSUE 20 SQ CM/< 1 ICD-10 Diagnosis Description L97.424 Non-pressure chronic ulcer of  left heel and midfoot with necrosis of bone L97.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 : The patient participates with Medicare or their insurance follows the Medicare Facility Guidelines: [id number] 11045 - DEB SUBQ TISS EA ADDL 20CM 1 ICD-10 Diagnosis Description L97.424 Non-pressure chronic ulcer of left heel and midfoot with necrosis of  bone L97.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 : The patient participates with Medicare or their insurance follows the Medicare Facility Guidelines: [id number] - WOUND VAC-50 SQ CM OR LESS 59 1 Physician Procedures : CPT4 Code Description Modifier [id number] - WC PHYS LEVEL 4 - EST PT 25 ICD-10 Diagnosis Description 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 L98.492 Non-pressure chronic ulcer of skin of other sites with fat layer exposed E11.621 Type 2 diabetes mellitus with foot ulcer Quantity: 1 : [id number] 11042 - WC PHYS SUBQ TISS 20 SQ CM ICD-10 Diagnosis Description 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 Quantity: 1 : [id number] 11045 - WC PHYS SUBQ TISS EA  ADDL 20 CM ICD-10 Diagnosis Description 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

## 2023-01-03 NOTE — Progress Notes (Signed)
JAMYRON, REDD R ([id number]) [id number] Page 1 of 2 Visit Report for 01/03/2023 Arrival Information Details Patient Name: Date of Service: [place], [name] 01/03/2023 10:00 A M Medical Record Number: [id number] Patient Account Number: [id number] Date of Birth/Sex: Treating RN: [birth year] (67 y.o. [name], Meta.Reding Primary Care Netta Fodge: Nelda Bucks Other Clinician: Donavan Burnet Referring Jerita Wimbush: Treating Nazeer Romney/Extender: Doristine Bosworth in Treatment: 51 Visit Information History Since Last Visit All ordered tests and consults were completed: Yes Patient Arrived: Knee Scooter Added or deleted any medications: No Arrival Time: 10:00 Any new allergies or adverse reactions: No Accompanied By: self Had a fall or experienced change in No Transfer Assistance: None activities of daily living that may affect Patient Identification Verified: Yes risk of falls: Secondary Verification Process Completed: Yes Signs or symptoms of abuse/neglect since last visito No Patient Requires Transmission-Based Precautions: No Hospitalized since last visit: No Patient Has Alerts: No Implantable device outside of the clinic excluding No cellular tissue based products placed in the center since last visit: Pain Present Now: No Electronic Signature(s) Signed: 01/03/2023 4:11:25 PM By: Donavan Burnet CHT EMT BS , , Entered By: Donavan Burnet on 01/03/2023 16:11:25 -------------------------------------------------------------------------------- Encounter Discharge Information Details Patient Name: Date of Service: [place], [name] 01/03/2023 10:00 A M Medical Record Number: [id number] Patient Account Number: [id number] Date of Birth/Sex: Treating RN: 1956/03/24 (67 y.o. [name], [name] Primary Care Fouad Taul: Nelda Bucks Other Clinician: Donavan Burnet Referring Chistine Dematteo: Treating Floyed Masoud/Extender: Doristine Bosworth in Treatment: 16 Encounter Discharge Information Items Discharge Condition: Stable Ambulatory Status: Knee Scooter Discharge Destination: Home Transportation: Private Auto Accompanied By: self Schedule Follow-up Appointment: No Clinical Summary of Care: Electronic Signature(s) Signed: 01/03/2023 4:25:52 PM By: Donavan Burnet CHT EMT BS , , Entered By: Donavan Burnet on 01/03/2023 16:25:52 [name] ([id number]) [id number] Page 2 of 2 -------------------------------------------------------------------------------- Vitals Details Patient Name: Date of Service: [place], [name] 01/03/2023 10:00 A M Medical Record Number: [id number] Patient Account Number: [id number] Date of Birth/Sex: Treating RN: Feb 13, 1956 (67 y.o. [name], Meta.Reding Primary Care Promise Bushong: Nelda Bucks Other Clinician: Donavan Burnet Referring Altheia Shafran: Treating Maximilliano Kersh/Extender: Doristine Bosworth in Treatment: 16 Vital Signs Time Taken: 09:41 Temperature (F): 97.7 Height (in): 74 Pulse (bpm): 94 Weight (lbs): 245 Respiratory Rate (breaths/min): 20 Body Mass Index (BMI): 31.5 Blood Pressure (mmHg): 138/73 Capillary Blood Glucose (mg/dl): 137 Reference Range: 80 - 120 mg / dl Notes Vitals from wound care visit this morning. CBG taken at 0941 Electronic Signature(s) Signed: 01/03/2023 4:13:55 PM By: Donavan Burnet CHT EMT BS , , Entered By: Donavan Burnet on 01/03/2023 16:13:55

## 2023-01-03 NOTE — Progress Notes (Signed)
EBERT, FORRESTER R ([id number]) [id number] Page 1 of 2 Visit Report for 01/03/2023 HBO Details Patient Name: Date of Service: [place], [name] 01/03/2023 10:00 A M Medical Record Number: [id number] Patient Account Number: [id number] Date of Birth/Sex: Treating RN: 14-Jun-1956 (67 y.o. 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 HBO Treatment Details Treatment Number: 35 Patient Type: Outpatient Chamber Type: Monoplace Chamber Serial #: [id number] Treatment Protocol: 2.0 ATA with 90 minutes oxygen, with two 5 minute air breaks Treatment Details Compression Rate Down: 1.5 psi / minute De-Compression Rate Up: 2.0 psi / minute A breaks and breathing ir Compress Tx Pressure periods Decompress Decompress Begins Reached (leave unused spaces Begins Ends blank) Chamber Pressure (ATA 1 2 2 2 2 2  --2 1 ) Clock Time (24 hr) 10:14 10:34 11:04 11:09 11:39 11:44 - - 12:14 12:25 Treatment Length: 131 (minutes) Treatment Segments: 4 Vital Signs Capillary Blood Glucose Reference Range: 80 - 120 mg / dl HBO Diabetic Blood Glucose Intervention Range: <131 mg/dl or >249 mg/dl Type: Time Vitals Blood Pulse: Respiratory Temperature: Capillary Blood Glucose Pulse Action Taken: Pressure: Rate: Glucose (mg/dl): Meter #: Oximetry (%) Taken: Pre 09:41 138/73 94 20 97.7 137 1 none per protocol Post 12:27 123/73 85 20 97.4 161 1 none per protocol Pre 10:05 123 1 Given 8 oz Glucerna for safety Treatment Response Treatment Toleration: Well Treatment Completion Status: Treatment Completed without Adverse  Event Treatment Notes Patient arrived, blood glucose measured at 137 mg/dL at 0941. Patient prepared for treatment. Vitals taken and an additional blood glucose measurement resulting in 123 mg/dL. Patient states that he ate yesterday, that blood glucose level was 300+ around 0030 and he self administered insulin at that time. For safety patient was given 8 oz Glucerna and orange juice in an empty water bottle for emergency use. Patient instructed not to consume the orange juice until after post-treatment glucose is measured to have an accurate assessment of how his blood glucose did during treatment. Patient safely placed in the chamber after a safety check. Chamber compressed at 1 psi/min until reaching 6 psi/g at which time rate set was increased to 1.5 psi/min after confirming proper ear equalization. Patient tolerated treatment well. Chamber decompressed at 2 psi/min. Post treatment glucose level was 161 mg/dL. Patient was stable upon discharge. Physician HBO Attestation: I certify that I supervised this HBO treatment in accordance with Medicare guidelines. A trained emergency response team is readily available per Yes hospital policies and procedures. Continue HBOT as ordered. Yes Electronic Signature(s) Signed: 01/03/2023 5:27:02 PM By: Fredirick Maudlin MD FACS Previous Signature: 01/03/2023 4:25:06 PM Version By: Donavan Burnet CHT EMT BS , , Entered By: Fredirick Maudlin on 01/03/2023 [place], [place] ([id number]) [id number] Page 2 of 2 -------------------------------------------------------------------------------- HBO Safety Checklist Details Patient Name: Date of Service: [place], [name] 01/03/2023 10:00 A M Medical Record Number: [id number] Patient Account Number: [id number] Date of Birth/Sex: Treating RN: June 18, 1956 (67 y.o. [name] Primary Care Kacee Sukhu: Nelda Bucks Other Clinician: Donavan Burnet Referring Railee Bonillas: Treating  Emme Rosenau/Extender: Doristine Bosworth in Treatment: 16 HBO Safety Checklist Items Safety Checklist Consent Form Signed Patient voided / foley secured and emptied When did you last eato Yesterday Last dose of  injectable or oral agent 0030 Ostomy pouch emptied and vented if applicable NA All implantable devices assessed, documented and approved NA Intravenous access site secured and place NA Valuables secured Linens and cotton and cotton/polyester blend (less than 51% polyester) Personal oil-based products / skin lotions / body lotions removed Wigs or hairpieces removed NA Smoking or tobacco materials removed NA Books / newspapers / magazines / loose paper removed Cologne, aftershave, perfume and deodorant removed Jewelry removed (may wrap wedding band) Make-up removed NA Hair care products removed Battery operated devices (external) removed Heating patches and chemical warmers removed Titanium eyewear removed Nail polish cured greater than 10 hours NA Casting material cured greater than 10 hours NA Hearing aids removed NA Loose dentures or partials removed dentures removed Prosthetics have been removed NA Patient demonstrates correct use of air break device (if applicable) Patient concerns have been addressed Patient grounding bracelet on and cord attached to chamber Specifics for Inpatients (complete in addition to above) Medication sheet sent with patient NA Intravenous medications needed or due during therapy sent with patient NA Drainage tubes (e.g. nasogastric tube or chest tube secured and vented) NA Endotracheal or Tracheotomy tube secured NA Cuff deflated of air and inflated with saline NA Airway suctioned NA Notes Paper version used prior to treatment. Electronic Signature(s) Signed: 01/03/2023 4:15:07 PM By: Donavan Burnet CHT EMT BS , , Entered By: Donavan Burnet on 01/03/2023 16:15:06

## 2023-01-03 NOTE — Progress Notes (Signed)
RAINEN, [name] ([id number]) [id number] Page 1 of 11 Visit Report for 01/03/2023 Arrival Information Details Patient Name: Date of Service: [name], [name] [name]. 01/03/2023 8:15 A M Medical Record Number: [id number] Patient Account Number: [id number] Date of Birth/Sex: Treating RN: [birth year] (67 y.o. M) Primary Care [name]: [name] Other Clinician: Referring [name]: Treating [name]/Extender: [name] in Treatment: 40 Visit Information History Since Last Visit All ordered tests and consults were completed: No Patient Arrived: Knee Scooter Added or deleted any medications: No Arrival Time: 08:16 Any new allergies or adverse reactions: No Accompanied By: self Had a fall or experienced change in No Transfer Assistance: None activities of daily living that may affect Patient Identification Verified: Yes risk of falls: Secondary Verification Process Completed: Yes Signs or symptoms of abuse/neglect since last visito No Patient Requires Transmission-Based Precautions: No Hospitalized since last visit: No Patient Has Alerts: No Implantable device outside of the clinic excluding No cellular tissue based products placed in the center since last visit: Has Dressing in Place as Prescribed: Yes Pain Present Now: No Electronic Signature(s) Signed: 01/03/2023 5:34:39 PM By: Baruch Gouty RN, BSN Entered By: Baruch Gouty on 01/03/2023 08:23:59 -------------------------------------------------------------------------------- Encounter Discharge Information Details Patient Name: Date of Service: [name], [name] 01/03/2023 8:15 A M Medical Record Number: [id number] Patient Account Number: [id number] Date of Birth/Sex: Treating RN: 02-02-1956 (67 y.o. [name] Primary Care [name]: [name] Other Clinician: Referring [name]: Treating [name]/Extender: [name] in  Treatment: 16 Encounter Discharge Information Items Post Procedure Vitals Discharge Condition: Stable Temperature (F): 97.7 Ambulatory Status: Knee Scooter Pulse (bpm): 94 Discharge Destination: Home Respiratory Rate (breaths/min): 18 Transportation: Private Auto Blood Pressure (mmHg): 138/73 Accompanied By: self Schedule Follow-up Appointment: Yes Clinical Summary of Care: Patient Declined Electronic Signature(s) Signed: 01/03/2023 5:34:39 PM By: Baruch Gouty RN, BSN Entered By: Baruch Gouty on 01/03/2023 [place], [name] ([id number]) [id number] Page 2 of 11 -------------------------------------------------------------------------------- Lower Extremity Assessment Details Patient Name: Date of Service: [name], [name] [name]. 01/03/2023 8:15 A M Medical Record Number: [id number] Patient Account Number: [id number] Date of Birth/Sex: Treating RN: 12/24/1955 (68 y.o. [name] Primary Care Careen Mauch: [name] Other Clinician: Referring [name]: Treating [name]/Extender: [name] in Treatment: 16 Edema Assessment Assessed: [name]: No] Patrice [name]: No] Edema: [Left: Ye] [Right: s] Calf Left: Right: Point of Measurement: From Medial Instep 48 cm Ankle Left: Right: Point of Measurement: From Medial Instep 26 cm Vascular Assessment Pulses: Dorsalis Pedis Palpable: [Left:No] Electronic Signature(s) Signed: 01/03/2023 5:34:39 PM By: Baruch Gouty RN, BSN Entered By: Baruch Gouty on 01/03/2023 08:27:18 -------------------------------------------------------------------------------- Multi Wound Chart Details Patient Name: Date of Service: [name], [name] 01/03/2023 8:15 A M Medical Record Number: [id number] Patient Account Number: [id number] Date of Birth/Sex: Treating RN: Dec 28, 1955 (67 y.o. M) Primary Care [name]: [name] Other Clinician: Referring Aryia Delira: Treating  [name]/Extender: [name] in Treatment: 16 Vital Signs Height(in): 74 Capillary Blood Glucose(mg/dl): 218 Weight(lbs): 245 Pulse(bpm): 94 Body Mass Index(BMI): 31.5 Blood Pressure(mmHg): 138/73 Temperature(F): 97.7 Respiratory Rate(breaths/min): 20 [1:Photos:] [3:[id number] Page 3 of 11] Left Calcaneus Left, Dorsal Foot Right Shoulder Wound Location: Pressure Injury Pressure Injury Gradually Appeared Wounding Event: Diabetic Wound/Ulcer of the Lower Diabetic Wound/Ulcer of the Lower Cyst Primary Etiology: Extremity Extremity Cataracts, Sleep Apnea, Hypertension, Cataracts, Sleep Apnea, Hypertension, Cataracts, Sleep Apnea, Hypertension, Comorbid History: Type II Diabetes, Osteomyelitis, Type II Diabetes, Osteomyelitis, Type II Diabetes, Osteomyelitis, Neuropathy Neuropathy Neuropathy 03/09/2019  07/09/2022 12/14/2022 Date Acquired: 16 16 2  Weeks of Treatment: Open Open Open Wound Status: No No No Wound Recurrence: 6x4.5x1.4 3.5x3.4x0.1 0.3x0.2x1.1 Measurements L x W x D (cm) 21.206 9.346 0.047 A (cm) : rea 29.688 0.935 0.052 Volume (cm) : 1.80% 0.80% 62.70% % Reduction in A rea: -37.50% 0.70% -108.00% % Reduction in Volume: Grade 3 Grade 2 Full Thickness Without Exposed Classification: Support Structures Large Medium Medium Exudate A mount: Serosanguineous Serosanguineous Serosanguineous Exudate Type: red, brown red, brown red, brown Exudate Color: Thickened Flat and Intact Distinct, outline attached Wound Margin: Large (67-100%) Small (1-33%) None Present (0%) Granulation A mount: Red, Pale Red N/A Granulation Quality: Small (1-33%) Large (67-100%) None Present (0%) Necrotic A mount: Fat Layer (Subcutaneous Tissue): Yes Fat Layer (Subcutaneous Tissue): Yes Fat Layer (Subcutaneous Tissue): Yes Exposed Structures: Bone: Yes Fascia: No Fascia: No Fascia: No Tendon: No Tendon: No Tendon:  No Muscle: No Muscle: No Muscle: No Joint: No Joint: No Joint: No Bone: No Bone: No Small (1-33%) Small (1-33%) Small (1-33%) Epithelialization: Debridement - Excisional Debridement - Excisional N/A Debridement: Pre-procedure Verification/Time Out 08:50 08:50 N/A Taken: Lidocaine 4% Topical Solution Lidocaine 4% Topical Solution N/A Pain Control: Callus, Subcutaneous, Slough Subcutaneous, Slough N/A Tissue Debrided: Skin/Subcutaneous Tissue Skin/Subcutaneous Tissue N/A Level: 27 11.9 N/A Debridement A (sq cm): rea Curette Curette N/A Instrument: Minimum Minimum N/A Bleeding: Pressure Pressure N/A Hemostasis A chieved: 0 0 N/A Procedural Pain: 0 0 N/A Post Procedural Pain: Procedure was tolerated well Procedure was tolerated well N/A Debridement Treatment Response: 6x4.5x1.4 3.5x3.4x0.1 N/A Post Debridement Measurements L x W x D (cm) 29.688 0.935 N/A Post Debridement Volume: (cm) Callus: Yes Scarring: Yes Rash: Yes Periwound Skin Texture: Maceration: Yes No Abnormalities Noted No Abnormalities Noted Periwound Skin Moisture: No Abnormalities Noted No Abnormalities Noted No Abnormalities Noted Periwound Skin Color: No Abnormality No Abnormality No Abnormality Temperature: Debridement Debridement N/A Procedures Performed: Negative Pressure Wound Therapy Maintenance (NPWT) Treatment Notes Electronic Signature(s) Signed: 01/03/2023 9:25:42 AM By: Fredirick Maudlin MD FACS Entered By: Fredirick Maudlin on 01/03/2023 09:25:42 -------------------------------------------------------------------------------- Multi-Disciplinary Care Plan Details Patient Name: Date of Service: [place], [name] 01/03/2023 8:15 A M Medical Record Number: [id number] Patient Account Number: [id number] Date of Birth/Sex: Treating RN: [birth year] (67 y.o. 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 and treatment procedures Potential for barotraumas to ears, sinuses, teeth, and lungs or cerebral gas embolism related to changes in atmospheric pressure inside hyperbaric oxygen chamber Potential for oxygen toxicity seizures related to delivery of 100% oxygen at an increased atmospheric pressure Potential for pulmonary oxygen toxicity related to delivery of 100% oxygen at an increased atmospheric pressure Goals: Barotrauma will be prevented during HBO2 Date Initiated: 10/06/2022 Target Resolution Date: 01/14/2023 Goal Status: Active Patient and/or family will be able to state/discuss factors appropriate to the management of their disease process during treatment Date Initiated: 10/06/2022 Target Resolution Date: 01/14/2023 Goal Status: Active Patient will tolerate the hyperbaric oxygen therapy treatment Date Initiated: 10/06/2022 Target Resolution Date: 01/14/2023 Goal Status: Active Patient/caregiver will verbalize understanding of HBO goals, rationale, procedures and potential hazards Date Initiated: 10/06/2022 Target Resolution Date: 01/14/2023 Goal Status: Active Interventions: Administer decongestants, per physician orders, prior to HBO2 Administer the correct therapeutic gas delivery based on  the patients needs and limitations, per physician order Assess and provide for patients comfort related to the hyperbaric environment and equalization of middle ear Assess for signs and symptoms related to adverse events, including but not limited to confinement anxiety, pneumothorax, oxygen toxicity and baurotrauma Notes: Wound/Skin Impairment Nursing Diagnoses: Impaired tissue  integrity Goals: Patient/caregiver will verbalize understanding of skin care regimen Date Initiated: 10/06/2022 Target Resolution Date: 01/14/2023 Goal Status: Active Ulcer/skin breakdown will have a volume reduction of 30% by week 4 Date Initiated: 09/10/2022 Date Inactivated: 10/06/2022 Target Resolution Date: 10/08/2022 Goal Status: Unmet Unmet Reason: osteo, HBOT Ulcer/skin breakdown will have a volume reduction of 50% by week 8 Date Initiated: 10/06/2022 Target Resolution Date: 01/14/2023 Goal Status: Active Interventions: Assess ulceration(s) every visit Provide education on ulcer and skin care Treatment Activities: Consult for HBO : 09/10/2022 Skin care regimen initiated : 09/10/2022 Notes: Electronic Signature(s) Signed: 01/03/2023 5:34:39 PM By: Zenaida Deed RN, BSN Entered By: Zenaida Deed on 01/03/2023 08:44:04 Negative Pressure Wound Therapy Maintenance (NPWT) Details -------------------------------------------------------------------------------- [name] ([id number]) [id number] Page 5 of 11 Patient Name: Date of Service: [place], [name] [name]. 01/03/2023 8:15 A M Medical Record Number: [id number] Patient Account Number: [id number] Date of Birth/Sex: Treating RN: Mar 16, 1956 (67 y.o. [name] Primary Care Jock Mahon: Foye [name] Other Clinician: Referring Shell Blanchette: Treating Kassondra Geil/Extender: Kandis Cocking in Treatment: 16 NPWT Maintenance Performed for: Wound #1 Left Calcaneus Additional Injuries Covered: No Performed By: Zenaida Deed, RN Type: VAC System Coverage Size (sq cm): 27 Pressure Type: Constant Pressure Setting: 125 mmHG Drain Type: None Primary Contact: Other : Sponge/Dressing Type: Foam, Black Date Initiated: 12/27/2022 Dressing Removed: No Quantity of Sponges/Gauze Removed: 1 Canister Changed: No Canister Exudate Volume: 50 Dressing Reapplied: No Quantity of Sponges/Gauze  Inserted: 1 Respones T Treatment: o good Days On NPWT : 8 Post Procedure Diagnosis Same as Pre-procedure Electronic Signature(s) Signed: 01/03/2023 5:34:39 PM By: Zenaida Deed RN, BSN Entered By: Zenaida Deed on 01/03/2023 08:56:15 -------------------------------------------------------------------------------- Pain Assessment Details Patient Name: Date of Service: [place], [name] 01/03/2023 8:15 A M Medical Record Number: [id number] Patient Account Number: [id number] Date of Birth/Sex: Treating RN: [birth year] (67 y.o. M) Primary Care Pascale Maves: Foye [name] Other Clinician: Referring Chantae Soo: Treating Kateria Cutrona/Extender: Kandis Cocking in Treatment: 16 Active Problems Location of Pain Severity and Description of Pain Patient Has Paino No Site Locations Rate the pain. Current Pain Level: 0 Pain Management and Medication Current Pain Management: STANLEE, [name] ([id number]) [phone].pdf Page 6 of 11 Electronic Signature(s) Signed: 01/03/2023 5:34:39 PM By: Zenaida Deed RN, BSN Entered By: Zenaida Deed on 01/03/2023 08:24:35 -------------------------------------------------------------------------------- Patient/Caregiver Education Details Patient Name: Date of Service: [name] Bolus [name] 1/22/2024andnbsp8:15 A M Medical Record Number: [id number] Patient Account Number: [id number] Date of Birth/Gender: Treating RN: 08-31-1956 (67 y.o. [name] Primary Care Physician: Foye [name] Other Clinician: Referring Physician: Treating Physician/Extender: Kandis Cocking in Treatment: 16 Education Assessment Education Provided To: Patient Education Topics Provided Offloading: Methods: Explain/Verbal Responses: Reinforcements needed, State content correctly Wound/Skin Impairment: Methods: Explain/Verbal Responses: Reinforcements needed, State content correctly Electronic  Signature(s) Signed: 01/03/2023 5:34:39 PM By: Zenaida Deed RN, BSN Entered By: Zenaida Deed on 01/03/2023 08:44:35 -------------------------------------------------------------------------------- Wound Assessment Details Patient Name: Date of Service: [place], [name] [name] 01/03/2023 8:15 A M Medical Record Number: [id number] Patient Account Number: [id number] Date of Birth/Sex: Treating RN: 30-Apr-1956 (67 y.o. [name] Primary Care Karleigh Bunte: Foye [name] Other Clinician: Referring Zari Cly:  Treating Tu Shimmel/Extender: Kandis Cocking in Treatment: 16 Wound Status Wound Number: 1 Primary Diabetic Wound/Ulcer of the Lower Extremity Etiology: Wound Location: Left Calcaneus Wound Open Wounding Event: Pressure Injury Status: Date Acquired: 03/09/2019 Comorbid Cataracts, Sleep Apnea, Hypertension, Type II Diabetes, Weeks Of Treatment: 16 History: Osteomyelitis, Neuropathy Clustered Wound: No Photos [name], [name] ([id number]) [id number] Page 7 of 11 Wound Measurements Length: (cm) 6 Width: (cm) 4.5 Depth: (cm) 1.4 Area: (cm) 21.206 Volume: (cm) 29.688 % Reduction in Area: 1.8% % Reduction in Volume: -37.5% Epithelialization: Small (1-33%) Tunneling: No Undermining: No Wound Description Classification: Grade 3 Wound Margin: Thickened Exudate Amount: Large Exudate Type: Serosanguineous Exudate Color: red, brown Foul Odor After Cleansing: No Slough/Fibrino Yes Wound Bed Granulation Amount: Large (67-100%) Exposed Structure Granulation Quality: Red, Pale Fascia Exposed: No Necrotic Amount: Small (1-33%) Fat Layer (Subcutaneous Tissue) Exposed: Yes Necrotic Quality: Adherent Slough Tendon Exposed: No Muscle Exposed: No Joint Exposed: No Bone Exposed: Yes Periwound Skin Texture Texture Color No Abnormalities Noted: No No Abnormalities Noted: Yes Callus: Yes Temperature / Pain Temperature: No  Abnormality Moisture No Abnormalities Noted: No Maceration: Yes Treatment Notes Wound #1 (Calcaneus) Wound Laterality: Left Cleanser Normal Saline Discharge Instruction: Cleanse the wound with Normal Saline prior to applying a clean dressing using gauze sponges, not tissue or cotton balls. Soap and Water Discharge Instruction: May shower and wash wound with dial antibacterial soap and water prior to dressing change. Peri-Wound Care keystone Topical Primary Dressing VAC Secondary Dressing Secured With Coban Self-Adherent Wrap 4x5 (in/yd) Discharge Instruction: Secure with Coban as directed. Kerlix Roll Sterile, 4.5x3.1 (in/yd) Discharge Instruction: Secure with Kerlix as directed. Compression Wrap Compression Stockings Add-Ons [name], [name] ([id number]) [phone].pdf Page 8 of 11 Electronic Signature(s) Signed: 01/03/2023 5:34:39 PM By: Zenaida Deed RN, BSN Entered By: Zenaida Deed on 01/03/2023 08:31:05 -------------------------------------------------------------------------------- Wound Assessment Details Patient Name: Date of Service: [place], [name] [name] 01/03/2023 8:15 A M Medical Record Number: [id number] Patient Account Number: [id number] Date of Birth/Sex: Treating RN: 1956-11-03 (67 y.o. [name] Primary Care Ancelmo Hunt: Foye [name] Other Clinician: Referring Brissa Asante: Treating Luba Matzen/Extender: Kandis Cocking in Treatment: 16 Wound Status Wound Number: 2 Primary Diabetic Wound/Ulcer of the Lower Extremity Etiology: Wound Location: Left, Dorsal Foot Wound Open Wounding Event: Pressure Injury Status: Date Acquired: 07/09/2022 Comorbid Cataracts, Sleep Apnea, Hypertension, Type II Diabetes, Weeks Of Treatment: 16 History: Osteomyelitis, Neuropathy Clustered Wound: No Photos Wound Measurements Length: (cm) 3.5 Width: (cm) 3.4 Depth: (cm) 0.1 Area: (cm) 9.346 Volume: (cm) 0.935 %  Reduction in Area: 0.8% % Reduction in Volume: 0.7% Epithelialization: Small (1-33%) Tunneling: No Undermining: No Wound Description Classification: Grade 2 Wound Margin: Flat and Intact Exudate Amount: Medium Exudate Type: Serosanguineous Exudate Color: red, brown Foul Odor After Cleansing: No Slough/Fibrino Yes Wound Bed Granulation Amount: Small (1-33%) Exposed Structure Granulation Quality: Red Fascia Exposed: No Necrotic Amount: Large (67-100%) Fat Layer (Subcutaneous Tissue) Exposed: Yes Necrotic Quality: Adherent Slough Tendon Exposed: No Muscle Exposed: No Joint Exposed: No Bone Exposed: No Periwound Skin Texture Texture Color No Abnormalities Noted: No No Abnormalities Noted: Yes Scarring: Yes Temperature / Pain Temperature: No Abnormality Moisture No Abnormalities [place], [name] ([id number]) [id number] Page 9 of 11 Treatment Notes Wound #2 (Foot) Wound Laterality: Dorsal, Left Cleanser Normal Saline Discharge Instruction: Cleanse the wound with Normal Saline prior to applying a clean dressing using gauze sponges, not tissue or cotton balls. Soap and Water Discharge Instruction: May shower and wash wound with dial  antibacterial soap and water prior to dressing change. Peri-Wound Care keystone Topical Primary Dressing Promogran Prisma Matrix, 4.34 (sq in) (silver collagen) Discharge Instruction: Moisten collagen with hydrogel Secondary Dressing Zetuvit Plus Silicone Border Dressing 4x4 (in/in) Discharge Instruction: Apply silicone border over primary dressing as directed. Secured With [employer] 4x5 (in/yd) Discharge Instruction: Secure with Coban as directed. Kerlix Roll Sterile, 4.5x3.1 (in/yd) Discharge Instruction: Secure with Kerlix as directed. Compression Wrap Compression Stockings Add-Ons Electronic Signature(s) Signed: 01/03/2023 5:34:39 PM By: Zenaida Deed RN, BSN Entered By: Zenaida Deed on 01/03/2023 08:32:21 -------------------------------------------------------------------------------- Wound Assessment Details Patient Name: Date of Service: [place], [name] [name] 01/03/2023 8:15 A M Medical Record Number: [id number] Patient Account Number: [id number] Date of Birth/Sex: Treating RN: 1956/11/04 (67 y.o. [name] Primary Care Jupiter Kabir: Foye [name] Other Clinician: Referring Rakesha Dalporto: Treating Lino Wickliff/Extender: Kandis Cocking in Treatment: 16 Wound Status Wound Number: 3 Primary Cyst Etiology: Wound Location: Right Shoulder Wound Open Wounding Event: Gradually Appeared Status: Date Acquired: 12/14/2022 Comorbid Cataracts, Sleep Apnea, Hypertension, Type II Diabetes, Weeks Of Treatment: 2 History: Osteomyelitis, Neuropathy Clustered Wound: No Photos [name], [name] ([id number]) [id number] Page 10 of 11 Wound Measurements Length: (cm) 0.3 Width: (cm) 0.2 Depth: (cm) 1.1 Area: (cm) 0.047 Volume: (cm) 0.052 % Reduction in Area: 62.7% % Reduction in Volume: -108% Epithelialization: Small (1-33%) Tunneling: No Undermining: No Wound Description Classification: Full Thickness Without Exposed Support Structures Wound Margin: Distinct, outline attached Exudate Amount: Medium Exudate Type: Serosanguineous Exudate Color: red, brown Foul Odor After Cleansing: No Slough/Fibrino No Wound Bed Granulation Amount: None Present (0%) Exposed Structure Necrotic Amount: None Present (0%) Fascia Exposed: No Fat Layer (Subcutaneous Tissue) Exposed: Yes Tendon Exposed: No Muscle Exposed: No Joint Exposed: No Bone Exposed: No Periwound Skin Texture Texture Color No Abnormalities Noted: No No Abnormalities Noted: Yes Rash: Yes Temperature / Pain Temperature: No Abnormality Moisture No Abnormalities Noted: Yes Treatment Notes Wound #3 (Shoulder) Wound Laterality: Right Cleanser Soap and  Water Discharge Instruction: May shower and wash wound with dial antibacterial soap and water prior to dressing change. Wound Cleanser Discharge Instruction: Cleanse the wound with wound cleanser prior to applying a clean dressing using gauze sponges, not tissue or cotton balls. Peri-Wound Care Skin Prep Discharge Instruction: Use skin prep as directed Topical Primary Dressing Iodoform packing strip 1/4 (in) Discharge Instruction: Lightly pack as instructed Secondary Dressing Zetuvit Plus Silicone Border Dressing 4x4 (in/in) Discharge Instruction: Apply silicone border over primary dressing as directed. Secured With Compression Wrap Compression Stockings Add-Ons [name], [name] ([id number]) [id number] Page 11 of 11 Electronic Signature(s) Signed: 01/03/2023 5:34:39 PM By: Zenaida Deed RN, BSN Entered By: Zenaida Deed on 01/03/2023 08:37:21 -------------------------------------------------------------------------------- Vitals Details Patient Name: Date of Service: [name], [name] [name] 01/03/2023 8:15 A M Medical Record Number: [id number] Patient Account Number: [id number] Date of Birth/Sex: Treating RN: 12/12/1956 (67 y.o. M) Primary Care Chestine Belknap: Foye [name] Other Clinician: Referring Myla Mauriello: Treating Tahirah Sara/Extender: Kandis Cocking in Treatment: 16 Vital Signs Time Taken: 08:17 Temperature (F): 97.7 Height (in): 74 Pulse (bpm): 94 Weight (lbs): 245 Respiratory Rate (breaths/min): 20 Body Mass Index (BMI): 31.5 Blood Pressure (mmHg): 138/73 Capillary Blood Glucose (mg/dl): 625 Reference Range: 80 - 120 mg / dl Notes glucose per pt report this am Electronic Signature(s) Signed: 01/03/2023 5:34:39 PM By: Zenaida Deed RN, BSN Entered By: Zenaida Deed on 01/03/2023 08:24:20

## 2023-01-04 ENCOUNTER — Encounter (HOSPITAL_BASED_OUTPATIENT_CLINIC_OR_DEPARTMENT_OTHER): Payer: Medicare Other | Admitting: General Surgery

## 2023-01-04 DIAGNOSIS — E11621 Type 2 diabetes mellitus with foot ulcer: Secondary | ICD-10-CM | POA: Diagnosis not present

## 2023-01-04 LAB — GLUCOSE, CAPILLARY
Glucose-Capillary: 130 mg/dL — ABNORMAL HIGH (ref 70–99)
Glucose-Capillary: 186 mg/dL — ABNORMAL HIGH (ref 70–99)
Glucose-Capillary: 186 mg/dL — ABNORMAL HIGH (ref 70–99)

## 2023-01-04 NOTE — Progress Notes (Signed)
VENKAT, ANKNEY R ([id number]) [id number] Page 1 of 2 Visit Report for 01/04/2023 Arrival Information Details Patient Name: Date of Service: [place], [name] 01/04/2023 10:00 A M Medical Record Number: [id number] Patient Account Number: [id number] Date of Birth/Sex: Treating RN: 1956-04-15 (67 y.o. [name] Primary Care Zakkiyya Barno: Nelda Bucks Other Clinician: Valeria Batman Referring Daquan Crapps: Treating Cordia Miklos/Extender: Doristine Bosworth in Treatment: 38 Visit Information History Since Last Visit All ordered tests and consults were completed: Yes Patient Arrived: Knee Scooter Added or deleted any medications: No Arrival Time: 10:05 Any new allergies or adverse reactions: No Accompanied By: None Had a fall or experienced change in No Transfer Assistance: None activities of daily living that may affect Patient Identification Verified: Yes risk of falls: Secondary Verification Process Completed: Yes Signs or symptoms of abuse/neglect since last visito No Patient Requires Transmission-Based Precautions: No Hospitalized since last visit: No Patient Has Alerts: No Implantable device outside of the clinic excluding No cellular tissue based products placed in the center since last visit: Pain Present Now: No Electronic Signature(s) Signed: 01/04/2023 3:20:04 PM By: Valeria Batman EMT Entered By: Valeria Batman on 01/04/2023 15:20:04 -------------------------------------------------------------------------------- Encounter Discharge Information Details Patient Name: Date of Service: [place], [name] 01/04/2023 10:00 A M Medical Record Number: [id number] Patient Account Number: [id number] Date of Birth/Sex: Treating RN: [birth year] (67 y.o. [name] Primary Care Kimi Bordeau: Nelda Bucks Other Clinician: Valeria Batman Referring Chanel Mcadams: Treating Alizaya Oshea/Extender: Doristine Bosworth in  Treatment: 16 Encounter Discharge Information Items Discharge Condition: Stable Ambulatory Status: Knee Scooter Discharge Destination: Home Transportation: Private Auto Accompanied By: None Schedule Follow-up Appointment: Yes Clinical Summary of Care: Electronic Signature(s) Signed: 01/04/2023 3:30:00 PM By: Valeria Batman EMT Entered By: Valeria Batman on 01/04/2023 15:29:59 [name] ([id number]) [id number] Page 2 of 2 -------------------------------------------------------------------------------- Vitals Details Patient Name: Date of Service: [place], [name] 01/04/2023 10:00 A M Medical Record Number: [id number] Patient Account Number: [id number] Date of Birth/Sex: Treating RN: 1956-09-03 (67 y.o. [name] Primary Care Quin Mcpherson: Nelda Bucks Other Clinician: Valeria Batman Referring Raja Caputi: Treating Faryn Sieg/Extender: Doristine Bosworth in Treatment: 16 Vital Signs Time Taken: 10:33 Capillary Blood Glucose (mg/dl): 130 Height (in): 74 Reference Range: 80 - 120 mg / dl Weight (lbs): 245 Body Mass Index (BMI): 31.5 Electronic Signature(s) Signed: 01/04/2023 3:20:32 PM By: Valeria Batman EMT Entered By: Valeria Batman on 01/04/2023 15:20:32

## 2023-01-04 NOTE — Progress Notes (Signed)
[name], [name] ([id number]) [id number] Page 1 of 2 Visit Report for 01/04/2023 HBO Details Patient Name: Date of Service: [place], [name] 01/04/2023 10:00 A M Medical Record Number: [id number] Patient Account Number: [id number] Date of Birth/Sex: Treating RN: 02-21-1956 (67 y.o. 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 HBO Treatment Details Treatment Number: 36 Patient Type: Outpatient Chamber Type: Monoplace Chamber Serial #: [id number] Treatment Protocol: 2.0 ATA with 90 minutes oxygen, with two 5 minute air breaks Treatment Details Compression Rate Down: 2.0 psi / minute De-Compression Rate Up: 2.0 psi / minute A breaks and breathing ir Compress Tx Pressure periods Decompress Decompress Begins Reached (leave unused spaces Begins Ends blank) Chamber Pressure (ATA 1 2 2 2 2 2  --2 1 ) Clock Time (24 hr) 11:01 11:13 11:43 11:48 12:18 12:23 - - 12:53 13:04 Treatment Length: 123 (minutes) Treatment Segments: 4 Vital Signs Capillary Blood Glucose Reference Range: 80 - 120 mg / dl HBO Diabetic Blood Glucose Intervention Range: <131 mg/dl or >249 mg/dl Type: Time Vitals Blood Pulse: Respiratory Capillary Blood Glucose Pulse Action Temperature: Taken: Pressure: Rate: Glucose (mg/dl): Meter #: Oximetry (%) Taken: Pre 10:33 130 Patient given 8 oz glucerna Post 13:06 116/59 73 18 97.8 186 Pre 10:34 103/55 82 18 97.9 Treatment Response Treatment Toleration: Well Treatment Completion Status: Treatment Completed without Adverse Event Physician HBO Attestation: I certify that I  supervised this HBO treatment in accordance with Medicare guidelines. A trained emergency response team is readily available per Yes hospital policies and procedures. Continue HBOT as ordered. Yes Electronic Signature(s) Signed: 01/04/2023 4:16:05 PM By: Fredirick Maudlin MD FACS Previous Signature: 01/04/2023 3:28:30 PM Version By: Valeria Batman EMT Entered By: Fredirick Maudlin on 01/04/2023 16:16:05 [name] ([id number]) [id number] Page 2 of 2 -------------------------------------------------------------------------------- HBO Safety Checklist Details Patient Name: Date of Service: [place], [name] 01/04/2023 10:00 A M Medical Record Number: [id number] Patient Account Number: [id number] Date of Birth/Sex: Treating RN: 1956/08/20 (67 y.o. [name] Primary Care Amalia Edgecombe: Nelda Bucks Other Clinician: Valeria Batman Referring Erinne Gillentine: Treating Jisela Merlino/Extender: Doristine Bosworth in Treatment: 16 HBO Safety Checklist Items Safety Checklist Consent Form Signed Patient voided / foley secured and emptied NA When did you last eato 0945 Last dose of injectable or oral agent 0030 last night Ostomy pouch emptied and vented if applicable NA All implantable devices assessed, documented and approved Tympanostomy tubes Intravenous access site secured and place NA Valuables secured Linens and cotton and cotton/polyester blend (less than 51% polyester) Personal oil-based products / skin lotions / body lotions removed Wigs or hairpieces removed NA Smoking or tobacco materials removed Books / newspapers / magazines / loose paper removed Cologne, aftershave, perfume and deodorant removed Jewelry removed (may wrap wedding band) Make-up removed NA Hair care products removed Battery operated devices (external) removed Heating patches and chemical warmers removed Titanium eyewear removed NA Nail polish cured greater than 10  hours NA Casting material cured greater than 10 hours NA Hearing aids removed NA Loose dentures or partials removed removed by patient Prosthetics have been removed NA Patient demonstrates correct use of air break device (if applicable) Patient concerns have been addressed Patient grounding bracelet on and cord attached to chamber Specifics for  Inpatients (complete in addition to above) Medication sheet sent with patient NA Intravenous medications needed or due during therapy sent with patient NA Drainage tubes (e.g. nasogastric tube or chest tube secured and vented) NA Endotracheal or Tracheotomy tube secured NA Cuff deflated of air and inflated with saline NA Airway suctioned NA Notes The safety checklist was done before the treatment was started. Electronic Signature(s) Signed: 01/04/2023 3:21:59 PM By: Valeria Batman EMT Entered By: Valeria Batman on 01/04/2023 15:21:59

## 2023-01-04 NOTE — Progress Notes (Signed)
DONTRE, LADUCA R ([id number]) [id number] Page 1 of 2 Visit Report for 01/04/2023 Problem List Details Patient Name: Date of Service: [name], [name] [name] 01/04/2023 10:00 A M Medical Record Number: [id number] Patient Account Number: [id number] Date of Birth/Sex: Treating RN: 01-12-1956 (67 y.o. 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 L97.523 Non-pressure chronic ulcer of other part of left foot with 09/10/2022 No Yes necrosis of muscle L98.492 Non-pressure chronic ulcer of skin of other sites with fat 12/17/2022 No Yes layer exposed L72.3 Sebaceous cyst 12/17/2022 No Yes U72.536 Other chronic osteomyelitis, left ankle and foot 09/10/2022 No Yes E11.65 Type 2 diabetes mellitus with hyperglycemia 09/10/2022 No Yes E11.621 Type 2 diabetes mellitus with foot ulcer 09/10/2022 No Yes Inactive Problems Resolved Problems Electronic Signature(s) Signed: 01/04/2023 3:29:21 PM By: Valeria Batman EMT Signed: 01/04/2023 4:15:16 PM By: Fredirick Maudlin MD FACS Entered By: Valeria Batman on 01/04/2023 15:29:21 SuperBill Details -------------------------------------------------------------------------------- [name] ([id number]) [id number] Page 2 of 2 Patient Name: Date of Service: [name], [name] 01/04/2023 Medical Record Number: [id number] Patient Account Number: [id number] Date of Birth/Sex: Treating RN: 1956/03/25 (67 y.o. 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 L98.492 Non-pressure chronic ulcer of skin of other sites with fat layer exposed L72.3 Sebaceous cyst J88.416 Other chronic osteomyelitis, left ankle and foot E11.65 Type 2 diabetes mellitus with hyperglycemia E11.621 Type 2 diabetes mellitus with foot ulcer Facility Procedures The patient participates with Medicare or their insurance follows the Medicare Facility Guidelines CPT4 Code Description Modifier Quantity [id number] G0277-(Facility Use Only) HBOT full body chamber, 4min , 4 ICD-10 Diagnosis Description [phone] Other chronic osteomyelitis, 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 Physician Procedures Quantity CPT4 Code Description Modifier [id number] 25427 - WC PHYS HYPERBARIC OXYGEN THERAPY 1 ICD-10 Diagnosis Description 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, 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

## 2023-01-05 ENCOUNTER — Encounter (HOSPITAL_BASED_OUTPATIENT_CLINIC_OR_DEPARTMENT_OTHER): Payer: Medicare Other | Admitting: General Surgery

## 2023-01-06 ENCOUNTER — Encounter (HOSPITAL_BASED_OUTPATIENT_CLINIC_OR_DEPARTMENT_OTHER): Payer: Medicare Other | Admitting: General Surgery

## 2023-01-06 DIAGNOSIS — E11621 Type 2 diabetes mellitus with foot ulcer: Secondary | ICD-10-CM | POA: Diagnosis not present

## 2023-01-06 LAB — GLUCOSE, CAPILLARY
Glucose-Capillary: 225 mg/dL — ABNORMAL HIGH (ref 70–99)
Glucose-Capillary: 143 mg/dL — ABNORMAL HIGH (ref 70–99)
Glucose-Capillary: 111 mg/dL — ABNORMAL HIGH (ref 70–99)

## 2023-01-06 NOTE — Progress Notes (Addendum)
[name], MAFFEI [name] ([id number]) [id number] Page 1 of 4 Visit Report for 01/06/2023 Physician Orders Details Patient Name: Date of Service: [name], [name] [name] 01/06/2023 1:00 PM Medical Record Number: [id number] Patient Account Number: [id number] Date of Birth/Sex: Treating RN: 07/02/1956 (67 y.o. 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 L98.492 Non-pressure chronic ulcer of skin of other sites with fat layer exposed L72.3 Sebaceous cyst T26.712 Other chronic osteomyelitis, left ankle and foot E11.65 Type 2 diabetes mellitus with hyperglycemia E11.621 Type 2 diabetes mellitus with foot ulcer [facility] TcPO2, hyperbaric - (ICD10 W58.099 - Other chronic osteomyelitis, left ankle and foot) Electronic Signature(s) Signed: 01/06/2023 1:50:40 PM By: Fredirick Maudlin MD FACS Entered By: Fredirick Maudlin on 01/06/2023 13:50:38 -------------------------------------------------------------------------------- Problem List Details Patient Name: Date of Service: [name] [place], [name] 01/06/2023 1:00 PM Medical Record Number: [id number] Patient Account Number: [id number] Date of Birth/Sex: Treating RN: [birth year] (67 y.o. 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 09/10/2022 No Yes [name] ([id number]) [id number].pdf Page 2 of 4 [phone] Non-pressure chronic ulcer of skin of other sites with fat layer exposed 12/17/2022 No Yes L72.3 Sebaceous cyst 12/17/2022 No Yes D40.814 Other chronic osteomyelitis, left ankle and foot 09/10/2022 No Yes E11.65 Type 2 diabetes mellitus with hyperglycemia 09/10/2022 No Yes E11.621 Type 2 diabetes mellitus with foot ulcer 09/10/2022 No Yes Inactive Problems Resolved Problems Electronic Signature(s) Signed: 01/06/2023 1:47:28 PM By: Fredirick Maudlin MD FACS Entered By: Fredirick Maudlin on 01/06/2023 13:47:28 -------------------------------------------------------------------------------- TCOM Details Patient Name: Date of Service: [name], [name] 01/06/2023 1:00 PM Medical Record Number: [id number] Patient Account Number: [id number] Date of Birth/Sex: Treating RN: 22-Apr-1956 (67 y.o. [name] Primary Care Provider: Nelda Bucks Other Clinician: Donavan Burnet Referring Provider: Treating Provider/Extender: Doristine Bosworth in Treatment: 16 Diagnosis Diagnosis: Ulcer of lower extremity, except decubitus Code: 707.1 TCOM Site Locations Site Locations TCOM Site Information Site #1 Left dorsal foot between 1st and 2nd Metatarsals Location Description: Regional Perfusion Index: DORN, HARTSHORNE ([id number]) [phone].pdf Page 3 of 4 Baseline @2ATA : Measurement: Extremity Oxygen Oxygen Final 15 min Elevated Challenge Challenge 15 min 30 min 45 min 60 min 75 min 90 min Time: Reading 15 min 5 min 10 min 1.4 - - - - - - - - - - mmHg: Air Breaks Given: Yes Notes Probe placed on dorsal foot on skin between 1st and 2nd metatarsals proximal to webbing of the first and second toes. Patient placed in chamber, left surface. Had slower travel  due to some equalization issues. Restarted chamber due to being unable to hover at a pressure lower than idle pressure and unfortunately restarted data collection at 1415. Data did not save on the PC. Patient was able to equalize with a little coaching. Reached 2.5 ATA at approximately 1453. Initial values read outside the chamber and not available  on the graph was 1.4 mmHg reading while breathing room air for approximately 15 minutes. When the patient was placed in the chamber, values increased to 7.4 mmHg and then to the level seen at the beginning of the graph (23 mmHg). This second data collection started at 1600. Electronic Signature(s) Signed: 01/08/2023 3:20:56 PM By: Donavan Burnet CHT EMT BS , , Signed: 01/10/2023 9:39:22 AM By: Fredirick Maudlin MD FACS Previous Signature: 01/08/2023 3:20:15 PM Version By: Donavan Burnet CHT EMT BS , , Entered By: Donavan Burnet on 01/08/2023 15:20:56 -------------------------------------------------------------------------------- SuperBill Details Patient Name: Date of Service: [name], [name] 01/06/2023 Medical Record Number: [id number] Patient Account Number: [id number] Date of Birth/Sex: Treating RN: 1956-08-28 (67 y.o. 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.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 L98.492 Non-pressure chronic ulcer of skin of other sites with fat layer exposed L72.3 Sebaceous cyst I69.629 Other chronic osteomyelitis, left ankle and foot E11.65 Type 2 diabetes mellitus with hyperglycemia E11.621 Type 2 diabetes mellitus with foot ulcer Facility Procedures : The patient participates  with Medicare or their insurance follows the Medicare Facility Guidelines: CPT4 Code Description Modifier Quantity [id number] G0277-(Facility Use Only) HBOT full body chamber, 9min , 5 ICD-10 Diagnosis Description [phone] Other  chronic osteomyelitis, 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 2 diabetes mellitus with foot  ulcer Physician Procedures : CPT4 Code Description Modifier [id number] 03474 - WC PHYS HYPERBARIC OXYGEN THERAPY ICD-10 Diagnosis Description 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, 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

## 2023-01-06 NOTE — Progress Notes (Addendum)
IZIC, STFORT R ([id number]) [id number] Page 1 of 2 Visit Report for 01/06/2023 HBO Details Patient Name: Date of Service: [name], [name] LD R. 01/06/2023 1:00 PM Medical Record Number: [id number] Patient Account Number: [id number] Date of Birth/Sex: Treating RN: February 25, 1956 (67 y.o. 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 HBO Treatment Details Treatment Number: 37 Patient Type: Outpatient Chamber Type: Monoplace Chamber Serial #: [id number] Treatment Protocol: 2.0 ATA with 90 minutes oxygen, with two 5 minute air breaks Treatment Details Compression Rate Down: 1.0 psi / minute De-Compression Rate Up: 1.5 psi / minute A breaks and breathing ir Compress Tx Pressure periods Decompress Decompress Begins Reached (leave unused spaces Begins Ends blank) Chamber Pressure (ATA 1 2 2 2 2 2  --2 1 ) Clock Time (24 hr) 14:15 14:53 15:23 15:28 15:58 16:01 - - 16:33 16:48 Treatment Length: 153 (minutes) Treatment Segments: 5 Vital Signs Capillary Blood Glucose Reference Range: 80 - 120 mg / dl HBO Diabetic Blood Glucose Intervention Range: <131 mg/dl or >249 mg/dl Type: Time Vitals Blood Respiratory Capillary Blood Glucose Pulse Action Pulse: Temperature: Taken: Pressure: Rate: Glucose (mg/dl): Meter #: Oximetry (%) Taken: Pre 13:42 139/88 96 18 98.4 225 1 Pre 13:40 143 1 Given 8 oz Glucerna Treatment Response Treatment Toleration: Well Treatment Completion Status: Treatment Completed without Adverse Event Treatment Notes Patient arrived for treatment, blood glucose measured at 225 mg/dL.  Patient prepared for treatment. Bedside patient stated that he had not eaten today and had self administered novolog at 1030. Blood glucose remeasured for safety, resulting in 143 mg/dL. Patient was given an 8 oz Glucerna. Patient safely placed in chamber after completing safety check and attaching TCOM probe. Patient had some issues with ear equalization today requiring a few stops during travel especially at the beginning. Chamber restarted twice due to not being able to hover at a pressure lower than idle pressure. Patient used valsalva maneuver to equalize pressure and was able to reach treatment depth. Patient tolerated treatment well and decompression around 1.5 psi/min. Patient stated that his ears were equalizing ("clearing") during compression at regular intervals and stated that they were popping during decompression without any pain. Post treatment blood glucose was 111 mg/dL. Patient was stable upon discharge. Physician HBO Attestation: I certify that I supervised this HBO treatment in accordance with Medicare guidelines. A trained emergency response team is readily available per Yes hospital policies and procedures. Continue HBOT as ordered. Yes Electronic Signature(s) Signed: 01/07/2023 7:42:55 AM By: Fredirick Maudlin MD FACS Previous Signature: 01/06/2023 5:26:19 PM Version By: Donavan Burnet CHT EMT BS , , Previous Signature: 01/06/2023 4:33:48 PM Version By: Donavan Burnet CHT EMT BS , , Entered By: Fredirick Maudlin on 01/07/2023 07:42:55 Stroble, Obie R ([id number]) [id number] Page 2 of 2 -------------------------------------------------------------------------------- HBO Safety Checklist Details Patient Name: Date of Service: [name], [name] LD R. 01/06/2023 1:00 PM Medical Record Number: [id number] Patient Account Number: [id number] Date of Birth/Sex: Treating RN: 03/29/1956 (67 y.o. [name] Primary Care Samiyah Stupka: Nelda Bucks Other  Clinician: Donavan Burnet Referring Maybel Dambrosio: Treating Callee Rohrig/Extender: Doristine Bosworth in Treatment: 16 HBO Safety Checklist Items Safety Checklist Consent Form Signed Patient voided / foley secured and emptied When did you last eato last evening 1130 Last  dose of injectable or oral agent 1030 AM Ostomy pouch emptied and vented if applicable NA All implantable devices assessed, documented and approved NA Intravenous access site secured and place NA Valuables secured Linens and cotton and cotton/polyester blend (less than 51% polyester) Personal oil-based products / skin lotions / body lotions removed Wigs or hairpieces removed NA Smoking or tobacco materials removed NA Books / newspapers / magazines / loose paper removed Cologne, aftershave, perfume and deodorant removed Jewelry removed (may wrap wedding band) Make-up removed NA Hair care products removed Battery operated devices (external) removed Heating patches and chemical warmers removed Titanium eyewear removed Nail polish cured greater than 10 hours NA Casting material cured greater than 10 hours NA Hearing aids removed NA Loose dentures or partials removed dentures removed Prosthetics have been removed NA Patient demonstrates correct use of air break device (if applicable) Patient concerns have been addressed Patient grounding bracelet on and cord attached to chamber Specifics for Inpatients (complete in addition to above) Medication sheet sent with patient NA Intravenous medications needed or due during therapy sent with patient NA Drainage tubes (e.g. nasogastric tube or chest tube secured and vented) NA Endotracheal or Tracheotomy tube secured NA Cuff deflated of air and inflated with saline NA Airway suctioned NA Notes Paper version used prior to treatment. Electronic Signature(s) Signed: 01/06/2023 4:32:30 PM By: Donavan Burnet CHT EMT BS , , Entered By:  Donavan Burnet on 01/06/2023 16:32:30

## 2023-01-07 ENCOUNTER — Encounter (HOSPITAL_BASED_OUTPATIENT_CLINIC_OR_DEPARTMENT_OTHER): Payer: Medicare Other | Admitting: General Surgery

## 2023-01-07 NOTE — Progress Notes (Signed)
GARL, SPEIGNER R ([id number]) [id number] Page 1 of 2 Visit Report for 01/06/2023 Arrival Information Details Patient Name: Date of Service: [place], [name] 01/06/2023 1:00 PM Medical Record Number: [id number] Patient Account Number: [id number] Date of Birth/Sex: Treating RN: 02-Sep-1956 (67 y.o. Jimmey Ralph, Jamie Primary Care Yuan Gann: Nelda Bucks Other Clinician: Donavan Burnet Referring Khriz Liddy: Treating Laquan Beier/Extender: Doristine Bosworth in Treatment: 2 Visit Information History Since Last Visit All ordered tests and consults were completed: Yes Patient Arrived: Knee Scooter Added or deleted any medications: No Arrival Time: 13:05 Any new allergies or adverse reactions: No Accompanied By: self Had a fall or experienced change in No Transfer Assistance: None activities of daily living that may affect Patient Identification Verified: Yes risk of falls: Secondary Verification Process Completed: Yes Signs or symptoms of abuse/neglect since last visito No Patient Requires Transmission-Based Precautions: No Hospitalized since last visit: No Patient Has Alerts: No Implantable device outside of the clinic excluding No cellular tissue based products placed in the center since last visit: Pain Present Now: No Electronic Signature(s) Signed: 01/06/2023 4:29:16 PM By: Donavan Burnet CHT EMT BS , , Entered By: Donavan Burnet on 01/06/2023 16:29:16 -------------------------------------------------------------------------------- Encounter Discharge Information Details Patient Name: Date of Service: [place], [name] 01/06/2023 1:00 PM Medical Record Number: [id number] Patient Account Number: [id number] Date of Birth/Sex: Treating RN: 1956/08/06 (67 y.o. [name] Primary Care Kieara Schwark: Nelda Bucks Other Clinician: Donavan Burnet Referring Taijon Vink: Treating Mihcael Ledee/Extender: Doristine Bosworth in Treatment: 16 Encounter Discharge Information Items Discharge Condition: Stable Ambulatory Status: Knee Scooter Discharge Destination: Home Transportation: Private Auto Accompanied By: self Schedule Follow-up Appointment: No Clinical Summary of Care: Electronic Signature(s) Signed: 01/06/2023 5:28:38 PM By: Donavan Burnet CHT EMT BS , , Entered By: Donavan Burnet on 01/06/2023 17:28:38 KHAMAURI, BAUERNFEIND R ([id number]) [id number] Page 2 of 2 -------------------------------------------------------------------------------- Vitals Details Patient Name: Date of Service: [place], [name] 01/06/2023 1:00 PM Medical Record Number: [id number] Patient Account Number: [id number] Date of Birth/Sex: Treating RN: 13-Feb-1956 (67 y.o. [name] Primary Care Keymari Sato: Nelda Bucks Other Clinician: Donavan Burnet Referring Khalia Gong: Treating Ashlee Player/Extender: Doristine Bosworth in Treatment: 16 Vital Signs Time Taken: 13:42 Temperature (F): 98.4 Height (in): 74 Pulse (bpm): 96 Weight (lbs): 245 Respiratory Rate (breaths/min): 18 Body Mass Index (BMI): 31.5 Blood Pressure (mmHg): 139/88 Capillary Blood Glucose (mg/dl): 225 Reference Range: 80 - 120 mg / dl Electronic Signature(s) Signed: 01/06/2023 4:30:19 PM By: Donavan Burnet CHT EMT BS , , Previous Signature: 01/06/2023 4:30:00 PM Version By: Donavan Burnet CHT EMT BS , , Entered By: Donavan Burnet on 01/06/2023 16:30:19

## 2023-01-10 ENCOUNTER — Encounter (HOSPITAL_BASED_OUTPATIENT_CLINIC_OR_DEPARTMENT_OTHER): Payer: Medicare Other | Admitting: General Surgery

## 2023-01-10 DIAGNOSIS — E11621 Type 2 diabetes mellitus with foot ulcer: Secondary | ICD-10-CM | POA: Diagnosis not present

## 2023-01-10 LAB — GLUCOSE, CAPILLARY
Glucose-Capillary: 209 mg/dL — ABNORMAL HIGH (ref 70–99)
Glucose-Capillary: 255 mg/dL — ABNORMAL HIGH (ref 70–99)
Glucose-Capillary: 145 mg/dL — ABNORMAL HIGH (ref 70–99)

## 2023-01-10 NOTE — Progress Notes (Signed)
TINSLEY, BRISSEY R ([id number]) [id number] Page 1 of 12 Visit Report for 01/10/2023 Arrival Information Details Patient Name: Date of Service: [place], [name] 01/10/2023 8:30 A M Medical Record Number: [id number] Patient Account Number: [id number] Date of Birth/Sex: Treating RN: September 28, 1956 (67 y.o. Jimmey Ralph, [place] Primary Care Birdia Jaycox: Nelda Bucks Other Clinician: Referring Jonte Shiller: Treating Selby Slovacek/Extender: Doristine Bosworth in Treatment: 40 Visit Information History Since Last Visit Added or deleted any medications: No Patient Arrived: Knee Scooter Any new allergies or adverse reactions: No Arrival Time: 08:27 Had a fall or experienced change in No Accompanied By: self activities of daily living that may affect Transfer Assistance: None risk of falls: Patient Identification Verified: Yes Signs or symptoms of abuse/neglect since last visito No Secondary Verification Process Completed: Yes Hospitalized since last visit: No Patient Requires Transmission-Based Precautions: No Implantable device outside of the clinic excluding No Patient Has Alerts: No cellular tissue based products placed in the center since last visit: Has Dressing in Place as Prescribed: Yes Pain Present Now: No Electronic Signature(s) Signed: 01/10/2023 4:55:35 PM By: Blanche East RN Entered By: Blanche East on 01/10/2023 08:27:59 -------------------------------------------------------------------------------- Encounter Discharge Information Details Patient Name: Date of Service: [place], [name] 01/10/2023 8:30 A M Medical Record Number: [id number] Patient Account Number: [id number] Date of Birth/Sex: Treating RN: Jan 31, 1956 (67 y.o. [name] Primary Care Yeison Sippel: Nelda Bucks Other Clinician: Referring Rylinn Linzy: Treating Jamin Humphries/Extender: Doristine Bosworth in Treatment: 17 Encounter Discharge Information Items  Post Procedure Vitals Discharge Condition: Stable Temperature (F): 97.6 Ambulatory Status: Knee Scooter Pulse (bpm): 105 Discharge Destination: Home Respiratory Rate (breaths/min): 18 Transportation: Private Auto Blood Pressure (mmHg): 179/83 Accompanied By: self Schedule Follow-up Appointment: Yes Clinical Summary of Care: Electronic Signature(s) Signed: 01/10/2023 4:55:35 PM By: Blanche East RN Entered By: Blanche East on 01/10/2023 09:04:03 [name] ([id number] Page 2 of 12 -------------------------------------------------------------------------------- Lower Extremity Assessment Details Patient Name: Date of Service: [place], [name] 01/10/2023 8:30 A M Medical Record Number: [id number] Patient Account Number: [id number] Date of Birth/Sex: Treating RN: [birth year] (67 y.o. [name] Primary Care Ravinder Hofland: Nelda Bucks Other Clinician: Referring Kennya Schwenn: Treating Emanuela Runnion/Extender: Doristine Bosworth in Treatment: 17 Edema Assessment Assessed: [name]: No] Patrice [name]: No] Edema: [Left: Ye] [Right: s] Calf Left: Right: Point of Measurement: From Medial Instep 46.4 cm Ankle Left: Right: Point of Measurement: From Medial Instep 25.5 cm Vascular Assessment Pulses: Dorsalis Pedis Palpable: [Left:Yes] Electronic Signature(s) Signed: 01/10/2023 4:55:35 PM By: Blanche East RN Entered By: Blanche East on 01/10/2023 08:38:41 -------------------------------------------------------------------------------- Multi Wound Chart Details Patient Name: Date of Service: [name], [name] 01/10/2023 8:30 A M Medical Record Number: [id number] Patient Account Number: [id number] Date of Birth/Sex: Treating RN: 1956/07/02 (67 y.o. M) Primary Care Lillee Mooneyhan: Nelda Bucks Other Clinician: Referring Zykera Abella: Treating Mirissa Lopresti/Extender: Doristine Bosworth in Treatment: 17 Vital  Signs Height(in): 74 Capillary Blood Glucose(mg/dl): 301 Weight(lbs): 245 Pulse(bpm): 105 Body Mass Index(BMI): 31.5 Blood Pressure(mmHg): 179/83 Temperature(F): 97.6 Respiratory Rate(breaths/min): 18 [1:Photos:] [id number].pdf Page 3 of 12] Left Calcaneus Left, Dorsal Foot Right Shoulder Wound Location: Pressure Injury Pressure Injury Gradually Appeared Wounding Event: Diabetic Wound/Ulcer of the Lower Diabetic Wound/Ulcer of the Lower Cyst Primary Etiology: Extremity Extremity Cataracts, Sleep Apnea, Hypertension, Cataracts, Sleep Apnea, Hypertension, Cataracts, Sleep Apnea, Hypertension, Comorbid History: Type II Diabetes, Osteomyelitis, Type II Diabetes, Osteomyelitis, Type II Diabetes, Osteomyelitis, Neuropathy Neuropathy Neuropathy 03/09/2019 07/09/2022 12/14/2022 Date Acquired: 17 17 3  Weeks of Treatment: Open  Open Open Wound Status: No No No Wound Recurrence: 6x5x0.6 2.2x2.6x0.1 0.2x0.3x1 Measurements L x W x D (cm) 23.562 4.492 0.047 A (cm) : rea 14.137 0.449 0.047 Volume (cm) : -9.10% 52.30% 62.70% % Reduction in A rea: 34.50% 52.30% -88.00% % Reduction in Volume: 6 Position 1 (o'clock): 0.8 Maximum Distance 1 (cm): No No Yes Tunneling: Grade 3 Grade 2 Full Thickness Without Exposed Classification: Support Structures Large Medium Medium Exudate A mount: Serosanguineous Serosanguineous Serosanguineous Exudate Type: red, brown red, brown red, brown Exudate Color: Thickened Flat and Intact Distinct, outline attached Wound Margin: Large (67-100%) Small (1-33%) None Present (0%) Granulation A mount: Red, Pale Red N/A Granulation Quality: Small (1-33%) Large (67-100%) None Present (0%) Necrotic A mount: Fat Layer (Subcutaneous Tissue): Yes Fat Layer (Subcutaneous Tissue): Yes Fat Layer (Subcutaneous Tissue): Yes Exposed Structures: Bone: Yes Fascia: No Fascia: No Fascia: No Tendon: No Tendon: No Tendon: No Muscle:  No Muscle: No Muscle: No Joint: No Joint: No Joint: No Bone: No Bone: No Small (1-33%) Small (1-33%) Small (1-33%) Epithelialization: Debridement - Selective/Open Wound Debridement - Selective/Open Wound N/A Debridement: Pre-procedure Verification/Time Out 08:58 08:58 N/A Taken: Callus Slough N/A Tissue Debrided: Skin/Epidermis Non-Viable Tissue N/A Level: 30 5.72 N/A Debridement A (sq cm): rea Curette Curette N/A Instrument: Minimum Minimum N/A Bleeding: Pressure Pressure N/A Hemostasis A chieved: Procedure was tolerated well Procedure was tolerated well N/A Debridement Treatment Response: 6x5x0.6 2.2x2.6x0.1 N/A Post Debridement Measurements L x W x D (cm) 14.137 0.449 N/A Post Debridement Volume: (cm) Callus: Yes Scarring: Yes Rash: Yes Periwound Skin Texture: Maceration: Yes No Abnormalities Noted No Abnormalities Noted Periwound Skin Moisture: No Abnormalities Noted No Abnormalities Noted No Abnormalities Noted Periwound Skin Color: No Abnormality No Abnormality No Abnormality Temperature: Debridement Debridement N/A Procedures Performed: Negative Pressure Wound Therapy Maintenance (NPWT) Treatment Notes Wound #1 (Calcaneus) Wound Laterality: Left Cleanser Normal Saline Discharge Instruction: Cleanse the wound with Normal Saline prior to applying a clean dressing using gauze sponges, not tissue or cotton balls. Soap and Water Discharge Instruction: May shower and wash wound with dial antibacterial soap and water prior to dressing change. Peri-Wound Care keystone Topical Primary Dressing VAC Secondary Dressing Secured With Coban Self-Adherent Wrap 4x5 (in/yd) Discharge Instruction: Secure with Coban as directed. Kerlix Roll Sterile, 4.5x3.1 (in/yd) Discharge Instruction: Secure with Kerlix as directed. TRAPPER, MEECH R ([id number]) [id number] Page 4 of 12 Compression Wrap Compression Stockings Add-Ons Wound #2 (Foot)  Wound Laterality: Dorsal, Left Cleanser Normal Saline Discharge Instruction: Cleanse the wound with Normal Saline prior to applying a clean dressing using gauze sponges, not tissue or cotton balls. Soap and Water Discharge Instruction: May shower and wash wound with dial antibacterial soap and water prior to dressing change. Peri-Wound Care keystone Topical Primary Dressing Promogran Prisma Matrix, 4.34 (sq in) (silver collagen) Discharge Instruction: Moisten collagen with hydrogel Secondary Dressing Zetuvit Plus Silicone Border Dressing 4x4 (in/in) Discharge Instruction: Apply silicone border over primary dressing as directed. Secured With [employer] 4x5 (in/yd) Discharge Instruction: Secure with Coban as directed. Kerlix Roll Sterile, 4.5x3.1 (in/yd) Discharge Instruction: Secure with Kerlix as directed. Compression Wrap Compression Stockings Add-Ons Wound #3 (Shoulder) Wound Laterality: Right Cleanser Soap and Water Discharge Instruction: May shower and wash wound with dial antibacterial soap and water prior to dressing change. Wound Cleanser Discharge Instruction: Cleanse the wound with wound cleanser prior to applying a clean dressing using gauze sponges, not tissue or cotton balls. Peri-Wound Care Skin Prep Discharge Instruction: Use skin prep as directed Topical Primary Dressing  Iodoform packing strip 1/4 (in) Discharge Instruction: Lightly pack as instructed Secondary Dressing Zetuvit Plus Silicone Border Dressing 4x4 (in/in) Discharge Instruction: Apply silicone border over primary dressing as directed. Secured With Compression Wrap Compression Stockings Environmental education officer) Signed: 01/10/2023 9:26:29 AM By: Fredirick Maudlin MD FACS Entered By: Fredirick Maudlin on 01/10/2023 09:26:29 [name] ([id number]) [id number] Page 5 of  12 -------------------------------------------------------------------------------- Multi-Disciplinary Care Plan Details Patient Name: Date of Service: [place], [name] 01/10/2023 8:30 A M Medical Record Number: [id number] Patient Account Number: [id number] Date of Birth/Sex: Treating RN: 1956/05/23 (67 y.o. 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 and treatment procedures Potential for barotraumas to ears, sinuses, teeth, and lungs or cerebral gas embolism related to changes in atmospheric pressure inside hyperbaric oxygen chamber Potential for oxygen toxicity seizures related to delivery of 100% oxygen at an increased atmospheric pressure Potential for pulmonary oxygen toxicity related to delivery of 100% oxygen at an increased atmospheric pressure Goals: Barotrauma will be prevented during HBO2 Date Initiated: 10/06/2022 Target Resolution Date: 01/14/2023 Goal Status: Active Patient and/or family will be able to state/discuss factors appropriate to the management of their disease process during treatment Date Initiated: 10/06/2022 Target Resolution Date: 01/14/2023 Goal Status: Active Patient will tolerate the hyperbaric oxygen therapy treatment Date Initiated: 10/06/2022 Target Resolution Date: 01/14/2023 Goal Status: Active Patient/caregiver will verbalize understanding of HBO goals, rationale, procedures and potential hazards Date Initiated: 10/06/2022 Target Resolution Date: 01/14/2023 Goal Status: Active Interventions: Administer decongestants, per physician orders, prior to HBO2 Administer the correct therapeutic gas delivery based on the patients needs and limitations, per physician  order Assess and provide for patients comfort related to the hyperbaric environment and equalization of middle ear Assess for signs and symptoms related to adverse events, including but not limited to confinement anxiety, pneumothorax, oxygen toxicity and baurotrauma Notes: Wound/Skin Impairment Nursing Diagnoses: Impaired tissue integrity Goals: Patient/caregiver will verbalize understanding of skin care regimen Date Initiated: 10/06/2022 Target Resolution Date: 03/10/2023 Goal Status: Active Ulcer/skin breakdown will have a volume reduction of 30% by week 4 Date Initiated: 09/10/2022 Date Inactivated: 10/06/2022 Target Resolution Date: 10/08/2022 Goal Status: Unmet Unmet Reason: osteo, HBOT Ulcer/skin breakdown will have a volume reduction of 50% by week 8 Date Initiated: 10/06/2022 Target Resolution Date: 01/14/2023 Goal Status: Active Interventions: Assess ulceration(s) every visit Provide education on ulcer and skin care Treatment Activities: Consult for HBO : 09/10/2022 Skin care regimen initiated : 09/10/2022 Notes: ADITYA, NASTASI ([id number]) [phone].pdf Page 6 of 12 Electronic Signature(s) Signed: 01/10/2023 8:52:55 AM By: Blanche East RN Entered By: Blanche East on 01/10/2023 08:52:54 -------------------------------------------------------------------------------- Negative Pressure Wound Therapy Maintenance (NPWT) Details Patient Name: Date of Service: [place], [name] 01/10/2023 8:30 A M Medical Record Number: [id number] Patient Account Number: [id number] Date of Birth/Sex: Treating RN: Oct 30, 1956 (67 y.o. [name] Primary Care Lani Havlik: Nelda Bucks Other Clinician: Referring Malky Rudzinski: Treating Shanan Mcmiller/Extender: Doristine Bosworth in Treatment: 17 NPWT Maintenance Performed for: Wound #1 Left Calcaneus Additional Injuries Covered: No Performed By: Blanche East, RN Type: VAC System Coverage Size (sq  cm): 30 Pressure Type: Constant Pressure Setting: 125 mmHG Drain Type: None Primary Contact: Other : Sponge/Dressing Type: Foam, Black Date Initiated: 12/27/2022 Dressing Removed: No Quantity of Sponges/Gauze Removed: 1 Canister Changed: No Canister Exudate Volume:  150 Dressing Reapplied: No Quantity of Sponges/Gauze Inserted: 1 Respones T Treatment: o tolerated well Days On NPWT : 15 Post Procedure Diagnosis Same as Pre-procedure Electronic Signature(s) Signed: 01/10/2023 4:55:35 PM By: Blanche East RN Entered By: Blanche East on 01/10/2023 09:03:06 -------------------------------------------------------------------------------- Pain Assessment Details Patient Name: Date of Service: [place], [name] 01/10/2023 8:30 A M Medical Record Number: [id number] Patient Account Number: [id number] Date of Birth/Sex: Treating RN: 09-11-1956 (67 y.o. [name] Primary Care Marrion Accomando: Nelda Bucks Other Clinician: Referring Desiree Daise: Treating Angelynn Lemus/Extender: Doristine Bosworth in Treatment: 17 Active Problems Location of Pain Severity and Description of Pain Patient Has Paino No Site Locations Rate the pain. JAHZION, QUANDER R ([id number]) [id number] Page 7 of 12 Rate the pain. Current Pain Level: 0 Pain Management and Medication Current Pain Management: Electronic Signature(s) Signed: 01/10/2023 4:55:35 PM By: Blanche East RN Entered By: Blanche East on 01/10/2023 08:37:59 -------------------------------------------------------------------------------- Patient/Caregiver Education Details Patient Name: Date of Service: [place] NA LD R. 1/29/2024andnbsp8:30 A M Medical Record Number: [id number] Patient Account Number: [id number] Date of Birth/Gender: Treating RN: 08/13/1956 (67 y.o. [name] Primary Care Physician: Nelda Bucks Other Clinician: Referring Physician: Treating Physician/Extender: Doristine Bosworth in Treatment: 17 Education Assessment Education Provided To: Patient Education Topics Provided Wound Debridement: Methods: Explain/Verbal Responses: Reinforcements needed, State content correctly Electronic Signature(s) Signed: 01/10/2023 4:55:35 PM By: Blanche East RN Entered By: Blanche East on 01/10/2023 08:54:14 -------------------------------------------------------------------------------- Wound Assessment Details Patient Name: Date of Service: [name], [name] 01/10/2023 8:30 A M Medical Record Number: [id number] Patient Account Number: [id number] Date of Birth/Sex: Treating RN: Oct 29, 1956 (67 y.o. [name] Primary Care Torion Hulgan: Nelda Bucks Other Clinician: Referring Zailey Audia: Treating Genia Perin/Extender: Johnnette Gourd [place], [place] ([id number]) [id number] Page 8 of 12 Weeks in Treatment: 17 Wound Status Wound Number: 1 Primary Diabetic Wound/Ulcer of the Lower Extremity Etiology: Wound Location: Left Calcaneus Wound Open Wounding Event: Pressure Injury Status: Date Acquired: 03/09/2019 Comorbid Cataracts, Sleep Apnea, Hypertension, Type II Diabetes, Weeks Of Treatment: 17 History: Osteomyelitis, Neuropathy Clustered Wound: No Photos Wound Measurements Length: (cm) 6 Width: (cm) 5 Depth: (cm) 0.6 Area: (cm) 23.562 Volume: (cm) 14.137 % Reduction in Area: -9.1% % Reduction in Volume: 34.5% Epithelialization: Small (1-33%) Tunneling: No Undermining: No Wound Description Classification: Grade 3 Wound Margin: Thickened Exudate Amount: Large Exudate Type: Serosanguineous Exudate Color: red, brown Foul Odor After Cleansing: No Slough/Fibrino Yes Wound Bed Granulation Amount: Large (67-100%) Exposed Structure Granulation Quality: Red, Pale Fascia Exposed: No Necrotic Amount: Small (1-33%) Fat Layer (Subcutaneous Tissue) Exposed: Yes Necrotic Quality: Adherent  Slough Tendon Exposed: No Muscle Exposed: No Joint Exposed: No Bone Exposed: Yes Periwound Skin Texture Texture Color No Abnormalities Noted: No No Abnormalities Noted: Yes Callus: Yes Temperature / Pain Temperature: No Abnormality Moisture No Abnormalities Noted: No Maceration: Yes Treatment Notes Wound #1 (Calcaneus) Wound Laterality: Left Cleanser Normal Saline Discharge Instruction: Cleanse the wound with Normal Saline prior to applying a clean dressing using gauze sponges, not tissue or cotton balls. Soap and Water Discharge Instruction: May shower and wash wound with dial antibacterial soap and water prior to dressing change. Peri-Wound Care [facility] Topical Primary Dressing [facility] KOHNER, KOWALSKY ([id number]) [id number] Page 9 of 12 Secured With [employer] 4x5 (in/yd) Discharge Instruction: Secure with Coban as directed. Kerlix Roll Sterile, 4.5x3.1 (in/yd) Discharge Instruction: Secure with Kerlix as directed. Compression Wrap Compression Stockings Add-Ons Electronic Signature(s) Signed: 01/10/2023 4:55:35 PM By: Willey Blade,  Jamie RN Entered By: Blanche East on 01/10/2023 08:45:54 -------------------------------------------------------------------------------- Wound Assessment Details Patient Name: Date of Service: [place], [name] 01/10/2023 8:30 A M Medical Record Number: [id number] Patient Account Number: [id number] Date of Birth/Sex: Treating RN: 1956/08/11 (67 y.o. [name] Primary Care Joyel Chenette: Nelda Bucks Other Clinician: Referring Avynn Klassen: Treating Dajia Gunnels/Extender: Doristine Bosworth in Treatment: 17 Wound Status Wound Number: 2 Primary Diabetic Wound/Ulcer of the Lower Extremity Etiology: Wound Location: Left, Dorsal Foot Wound Open Wounding Event: Pressure Injury Status: Date Acquired: 07/09/2022 Comorbid Cataracts, Sleep Apnea, Hypertension, Type II  Diabetes, Weeks Of Treatment: 17 History: Osteomyelitis, Neuropathy Clustered Wound: No Photos Wound Measurements Length: (cm) 2.2 Width: (cm) 2.6 Depth: (cm) 0.1 Area: (cm) 4.492 Volume: (cm) 0.449 % Reduction in Area: 52.3% % Reduction in Volume: 52.3% Epithelialization: Small (1-33%) Tunneling: No Undermining: No Wound Description Classification: Grade 2 Wound Margin: Flat and Intact Exudate Amount: Medium Exudate Type: Serosanguineous Exudate Color: red, brown Foul Odor After Cleansing: No Slough/Fibrino Yes Wound Bed Granulation Amount: Small (1-33%) Exposed Structure Granulation Quality: Red Fascia Exposed: No Necrotic Amount: Large (67-100%) Fat Layer (Subcutaneous Tissue) Exposed: Yes Necrotic Quality: Adherent Slough Tendon Exposed: No ROYCE, STEGMAN R ([id number]) [id number] Page 10 of 12 Muscle Exposed: No Joint Exposed: No Bone Exposed: No Periwound Skin Texture Texture Color No Abnormalities Noted: No No Abnormalities Noted: Yes Scarring: Yes Temperature / Pain Temperature: No Abnormality Moisture No Abnormalities Noted: Yes Treatment Notes Wound #2 (Foot) Wound Laterality: Dorsal, Left Cleanser Normal Saline Discharge Instruction: Cleanse the wound with Normal Saline prior to applying a clean dressing using gauze sponges, not tissue or cotton balls. Soap and Water Discharge Instruction: May shower and wash wound with dial antibacterial soap and water prior to dressing change. Peri-Wound Care keystone Topical Primary Dressing Promogran Prisma Matrix, 4.34 (sq in) (silver collagen) Discharge Instruction: Moisten collagen with hydrogel Secondary Dressing Zetuvit Plus Silicone Border Dressing 4x4 (in/in) Discharge Instruction: Apply silicone border over primary dressing as directed. Secured With [employer] 4x5 (in/yd) Discharge Instruction: Secure with Coban as directed. Kerlix Roll Sterile, 4.5x3.1  (in/yd) Discharge Instruction: Secure with Kerlix as directed. Compression Wrap Compression Stockings Add-Ons Electronic Signature(s) Signed: 01/10/2023 4:55:35 PM By: Blanche East RN Entered By: Blanche East on 01/10/2023 08:46:30 -------------------------------------------------------------------------------- Wound Assessment Details Patient Name: Date of Service: [place], [name] 01/10/2023 8:30 A M Medical Record Number: [id number] Patient Account Number: [id number] Date of Birth/Sex: Treating RN: [birth year] (67 y.o. [name] Primary Care Nechemia Chiappetta: Nelda Bucks Other Clinician: Referring Harrie Cazarez: Treating Juwann Sherk/Extender: Doristine Bosworth in Treatment: 17 Wound Status Wound Number: 3 Primary Cyst Etiology: Wound Location: Right Shoulder Wound Open Wounding Event: Gradually Appeared Status: Date Acquired: 12/14/2022 Comorbid Cataracts, Sleep Apnea, Hypertension, Type II Diabetes, Weeks Of Treatment: 3 History: Osteomyelitis, Neuropathy Clustered Wound: No BRITTAN, MAPEL ([id number]) [id number].pdf Page 11 of 12 Photos Wound Measurements Length: (cm) 0.2 Width: (cm) 0.3 Depth: (cm) 1 Area: (cm) 0.047 Volume: (cm) 0.047 % Reduction in Area: 62.7% % Reduction in Volume: -88% Epithelialization: Small (1-33%) Tunneling: Yes Position (o'clock): 6 Maximum Distance: (cm) 0.8 Undermining: No Wound Description Classification: Full Thickness Without Exposed Support Structures Wound Margin: Distinct, outline attached Exudate Amount: Medium Exudate Type: Serosanguineous Exudate Color: red, brown Foul Odor After Cleansing: No Slough/Fibrino No Wound Bed Granulation Amount: None Present (0%) Exposed Structure Necrotic Amount: None Present (0%) Fascia Exposed: No Fat Layer (Subcutaneous Tissue) Exposed: Yes Tendon Exposed: No Muscle Exposed: No Joint Exposed:  No Bone Exposed: No Periwound Skin  Texture Texture Color No Abnormalities Noted: No No Abnormalities Noted: Yes Rash: Yes Temperature / Pain Temperature: No Abnormality Moisture No Abnormalities Noted: Yes Treatment Notes Wound #3 (Shoulder) Wound Laterality: Right Cleanser Soap and Water Discharge Instruction: May shower and wash wound with dial antibacterial soap and water prior to dressing change. Wound Cleanser Discharge Instruction: Cleanse the wound with wound cleanser prior to applying a clean dressing using gauze sponges, not tissue or cotton balls. Peri-Wound Care Skin Prep Discharge Instruction: Use skin prep as directed Topical Primary Dressing Iodoform packing strip 1/4 (in) Discharge Instruction: Lightly pack as instructed Secondary Dressing Zetuvit Plus Silicone Border Dressing 4x4 (in/in) Discharge Instruction: Apply silicone border over primary dressing as directed. Secured With [employer] ([id number]) [id number] Page 12 of 12 Compression Wrap Compression Stockings Add-Ons Electronic Signature(s) Signed: 01/10/2023 4:55:35 PM By: Blanche East RN Entered By: Blanche East on 01/10/2023 08:46:52 -------------------------------------------------------------------------------- Vitals Details Patient Name: Date of Service: [name], [name] 01/10/2023 8:30 A M Medical Record Number: [id number] Patient Account Number: [id number] Date of Birth/Sex: Treating RN: 1956-02-21 (67 y.o. [name] Primary Care Conrado Nance: Nelda Bucks Other Clinician: Referring Kabao Leite: Treating Sabien Umland/Extender: Doristine Bosworth in Treatment: 17 Vital Signs Time Taken: 08:28 Temperature (F): 97.6 Height (in): 74 Pulse (bpm): 105 Weight (lbs): 245 Respiratory Rate (breaths/min): 18 Body Mass Index (BMI): 31.5 Blood Pressure (mmHg): 179/83 Capillary Blood Glucose (mg/dl): 301 Reference Range: 80 - 120 mg / dl Electronic Signature(s) Signed:  01/10/2023 4:55:35 PM By: Blanche East RN Entered By: Blanche East on 01/10/2023 08:34:28

## 2023-01-10 NOTE — Progress Notes (Signed)
[name], RHO R ([id number]) [id number] Page 1 of 2 Visit Report for 01/10/2023 HBO Details Patient Name: Date of Service: [place], [name] 01/10/2023 10:00 A M Medical Record Number: [id number] Patient Account Number: [id number] Date of Birth/Sex: Treating RN: December 10, 1956 (67 y.o. 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 HBO Treatment Details Treatment Number: 38 Patient Type: Outpatient Chamber Type: Monoplace Chamber Serial #: [id number] Treatment Protocol: 2.0 ATA with 90 minutes oxygen, with two 5 minute air breaks Treatment Details Compression Rate Down: 1.0 psi / minute De-Compression Rate Up: 2.0 psi / minute A breaks and breathing ir Compress Tx Pressure periods Decompress Decompress Begins Reached (leave unused spaces Begins Ends blank) Chamber Pressure (ATA 1 2 2 2 2 2  --2 1 ) Clock Time (24 hr) 10:16 10:36 11:06 11:01 11:41 11:46 - - 12:16 12:30 Treatment Length: 134 (minutes) Treatment Segments: 4 Vital Signs Capillary Blood Glucose Reference Range: 80 - 120 mg / dl HBO Diabetic Blood Glucose Intervention Range: <131 mg/dl or >249 mg/dl Type: Time Vitals Blood Respiratory Capillary Blood Glucose Pulse Action Pulse: Temperature: Taken: Pressure: Rate: Glucose (mg/dl): Meter #: Oximetry (%) Taken: Pre 09:58 137/79 90 18 97.9 255 1 Reported to Veronika Heard Post 12:33 140/75 75 18 97.7 145 1 none per protocol Treatment Response Treatment Toleration: Well Treatment Completion Status: Treatment Completed without Adverse Event Physician HBO Attestation: I certify that I  supervised this HBO treatment in accordance with Medicare guidelines. A trained emergency response team is readily available per Yes hospital policies and procedures. Continue HBOT as ordered. Yes Electronic Signature(s) Signed: 01/10/2023 4:08:52 PM By: Fredirick Maudlin MD FACS Previous Signature: 01/10/2023 4:05:31 PM Version By: Donavan Burnet CHT EMT BS , , Entered By: Fredirick Maudlin on 01/10/2023 16:08:51 HBO Safety Checklist Details -------------------------------------------------------------------------------- [name] ([id number]) [id number] Page 2 of 2 Patient Name: Date of Service: [place], [name] 01/10/2023 10:00 A M Medical Record Number: [id number] Patient Account Number: [id number] Date of Birth/Sex: Treating RN: 1956/01/29 (67 y.o. [name], Meta.Reding Primary Care Dixie Jafri: Nelda Bucks Other Clinician: Donavan Burnet Referring Zhaniya Swallows: Treating Candida Vetter/Extender: Doristine Bosworth in Treatment: 17 HBO Safety Checklist Items Safety Checklist Consent Form Signed Patient voided / foley secured and emptied When did you last eato 0330 Last dose of injectable or oral agent 0330 90ULongacting/12U Short acting Ostomy pouch emptied and vented if applicable NA All implantable devices assessed, documented and approved NA Intravenous access site secured and place NA Valuables secured Linens and cotton and cotton/polyester blend (less than 51% polyester) Personal oil-based products / skin lotions / body lotions removed Wigs or hairpieces removed NA Smoking or tobacco materials removed NA Books / newspapers / magazines / loose paper removed Cologne, aftershave, perfume and deodorant removed Jewelry removed (may wrap wedding band) Make-up removed Hair care products removed Battery operated devices (external) removed Heating patches and chemical warmers removed Titanium eyewear removed NA Nail polish cured  greater than 10 hours NA Casting material cured greater than 10 hours NA Hearing aids removed NA Loose dentures or partials removed dentures removed Prosthetics have been removed NA Patient demonstrates correct use of air break device (if applicable) Patient concerns have been addressed Patient grounding bracelet on and cord attached to  chamber Specifics for Inpatients (complete in addition to above) Medication sheet sent with patient NA Intravenous medications needed or due during therapy sent with patient NA Drainage tubes (e.g. nasogastric tube or chest tube secured and vented) NA Endotracheal or Tracheotomy tube secured NA Cuff deflated of air and inflated with saline NA Airway suctioned NA Notes Paper version used prior to treatment. Electronic Signature(s) Signed: 01/10/2023 4:03:53 PM By: Donavan Burnet CHT EMT BS , , Entered By: Donavan Burnet on 01/10/2023 16:03:53

## 2023-01-10 NOTE — Progress Notes (Addendum)
[name], [name] [name] ([id number]) [id number] Page 1 of 13 Visit Report for 01/10/2023 Chief Complaint Document Details Patient Name: Date of Service: [name]. 01/10/2023 8:30 A M Medical Record Number: [id number] Patient Account Number: [id number] Date of Birth/Sex: Treating RN: August 20, 1956 (67 y.o. M) Primary Care Provider: Nelda [name] Other Clinician: Referring Provider: Treating Provider/Extender: [name] in Treatment: 17 Information Obtained from: Patient Chief Complaint Patients presents for treatment of an open diabetic ulcer and evaluation for hyperbaric oxygen therapy Electronic Signature(s) Signed: 01/10/2023 9:26:40 AM By: [name] Maudlin MD FACS Entered By: [name] on 01/10/2023 09:26:40 -------------------------------------------------------------------------------- Debridement Details Patient Name: Date of Service: [name], [name] 01/10/2023 8:30 A M Medical Record Number: [id number] Patient Account Number: [id number] Date of Birth/Sex: Treating RN: 1955-12-16 (67 y.o. [name] Primary Care Provider: Nelda [name] Other Clinician: Referring Provider: Treating Provider/Extender: [name] in Treatment: 17 Debridement Performed for Assessment: Wound #2 Left,Dorsal Foot Performed By: Physician [name] Maudlin, MD Debridement Type: Debridement Severity of Tissue Pre Debridement: Fat layer exposed Level of Consciousness (Pre-procedure): Awake and Alert Pre-procedure Verification/Time Out Yes - 08:58 Taken: Start Time: 08:59 T Area Debrided (L x W): otal 2.2 (cm) x 2.6 (cm) = 5.72 (cm) Tissue and other material debrided: Non-Viable, Slough, Slough Level: Non-Viable Tissue Debridement Description: Selective/Open Wound Instrument: Curette Bleeding: Minimum Hemostasis Achieved: Pressure Response to Treatment: Procedure was tolerated well Level of  Consciousness (Post- Awake and Alert procedure): Post Debridement Measurements of Total Wound Length: (cm) 2.2 Width: (cm) 2.6 Depth: (cm) 0.1 Volume: (cm) 0.449 Character of Wound/Ulcer Post Debridement: Requires Further Debridement Severity of Tissue Post Debridement: Fat layer exposed Post Procedure Diagnosis Same as Pre-procedure [name], [name] [name] ([id number] Page 2 of 13 Notes Scribed for Dr. Celine [name] by [name] East, RN Electronic Signature(s) Signed: 01/10/2023 9:39:22 AM By: [name] Maudlin MD FACS Signed: 01/10/2023 4:55:35 PM By: [name] East RN Entered By: [name] on 01/10/2023 09:00:25 -------------------------------------------------------------------------------- Debridement Details Patient Name: Date of Service: [name], [name] 01/10/2023 8:30 A M Medical Record Number: [id number] Patient Account Number: [id number] Date of Birth/Sex: Treating RN: 1956-03-01 (67 y.o. [name] Primary Care Provider: Nelda [name] Other Clinician: Referring Provider: Treating Provider/Extender: [name] in Treatment: 17 Debridement Performed for Assessment: Wound #1 Left Calcaneus Performed By: Physician [name] Maudlin, MD Debridement Type: Debridement Severity of Tissue Pre Debridement: Fat layer exposed Level of Consciousness (Pre-procedure): Awake and Alert Pre-procedure Verification/Time Out Yes - 08:58 Taken: Start Time: 08:59 T Area Debrided (L x W): otal 6 (cm) x 5 (cm) = 30 (cm) Tissue and other material debrided: Non-Viable, Callus, Skin: Epidermis, Biofilm Level: Skin/Epidermis Debridement Description: Selective/Open Wound Instrument: Curette Bleeding: Minimum Hemostasis Achieved: Pressure Response to Treatment: Procedure was tolerated well Level of Consciousness (Post- Awake and Alert procedure): Post Debridement Measurements of Total Wound Length: (cm) 6 Width: (cm)  5 Depth: (cm) 0.6 Volume: (cm) 14.137 Character of Wound/Ulcer Post Debridement: Requires Further Debridement Severity of Tissue Post Debridement: Fat layer exposed Post Procedure Diagnosis Same as Pre-procedure Notes Scribed for Dr. Celine [name] by [name] East, RN Electronic Signature(s) Signed: 01/10/2023 9:39:22 AM By: [name] Maudlin MD FACS Signed: 01/10/2023 4:55:35 PM By: [name] East RN Entered By: [name] on 01/10/2023 09:02:29 -------------------------------------------------------------------------------- HPI Details Patient Name: Date of Service: [name], [name] 01/10/2023 8:30 A M Medical Record Number: [id number] Patient Account Number: [id number] [name], [name] ([id number]) [id number] Page 3 of 13  Date of Birth/Sex: Treating RN: July 08, 1956 (67 y.o. M) Primary Care Provider: Other Clinician: Nelda [name] Referring Provider: Treating Provider/Extender: [name] in Treatment: 17 History of Present Illness HPI Description: ADMISSION 09/10/2022 This is a 67 year old poorly controlled type II diabetic (last hemoglobin A1c 10.8%) who has had an ulcer on his heel for over 3 years. He has been seen in multiple wound care centers, including [facility] and [facility]. He reports that at least 3 doctors have recommended that he undergo below-knee amputation. He most recently met with [name], a vascular surgeon affiliated with [facility]. 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 Unfortunately, the patient has some extenuating social circumstances including the fact that he cares for his wife who has stage IV colon cancer and still works, driving vehicles for [employer]. 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 He was recently hospitalized at [facility] for sepsis and currently has a  PICC line through which he receives IV antibiotics. He reports having had another MRI during that hospital stay along with a chest x-ray and EKG. He apparently contacted one of the hyperbaric therapy techs here and asked a number of questions about hyperbaric oxygen treatments. He subsequently self-referred to our center to undergo further evaluation and management. I mention to him that [facility] actually has hyperbaric chambers, but he states that he lives in [place] and this would be more convenient for him given the intensive nature of the therapy and time requirement. ABI in clinic today was 0.94. The patient actually has 2 wounds. There is a wound on the dorsum of his left foot with heavy black eschar and slough present. After debridement, this was demonstrated to involve the muscle and the extensor tendons are exposed. On his heel, there is essentially a "shark bite" type wound, with much of the heel fat pad absent. The muscle layer is exposed. There is blue-green staining around the perimeter of the wound, but no significant odor. He says he has been applying collagen to the wound on his heel and Silvadene and Betadine to the wound on his dorsal foot. 09/20/2022: The heel wound is quite macerated with wet periwound callus. There is slough accumulation on the surface. The dorsal foot wound looks better this week. There is still exposed tendon, but it is fairly clean with just a little biofilm buildup. We are still working on gathering the required documentation to submit for pretreatment review for hyperbaric oxygen therapy. 09/29/2022: The dorsal foot wound continues to improve. I do not see any exposed tendon at this point. There is just some slough accumulation on the wound surface. He continues to have very wet macerated periwound callus on his heel. There is slough on the surface, but it is loose and thin. There is an area of undermining at the 11 o'clock position, but the overlying skin  and subcutaneous tissue is healthy and viable. He has been approved for hyperbaric oxygen therapy and will start treatment tomorrow. 10/06/2022: Continued contraction and improvement of the dorsal foot wound. There is just a little bit of slough accumulation on the wound surface. The periwound callus continues to accumulate on the heel wound and it is quite macerated. It is also persistently found with blue-green staining present. He did initiate his hyperbaric oxygen therapy, but has had significant difficulty with decompression. He will be going to ENT to have PE tubes  placed. 10/18/2022: His hyperbaric oxygen therapy is on hold while his otological issues are being addressed. He came in again today with the periwound skin on both the dorsal aspect of his foot and his calcaneus completely macerated. The blue-green discoloration, however, has abated and the undermining on the calcaneus has improved. The dorsal foot wound has also contracted somewhat. 10/26/2022: The dorsal foot wound continues to contract and fill with good granulation tissue. The heel, once again, has a rim of macerated callus, but the undermining and tunneling continues to contract. He has completed his oral antibiotics and has been using the [facility] topical compounded antibiotic for his dressing changes at home. He is scheduled to see ENT this afternoon. 11/08/2022: The dorsal foot wound is flush with the surrounding skin and has a good granulation tissue surface. There is some slough accumulation. As usual, the heel has a rim of macerated callus but the dimensions are smaller and the tunneling and undermining have contracted further. He has resumed his hyperbaric oxygen therapy. 12/12; patient seen for wound evaluation. He is tolerating HBO although we could not dive him yesterday because of relative hypoglycemia and the fact he had given himself NovoLog insulin before he came to clinic. Today his blood sugar is in the 180 range  she should be fine. He has a large wound on the plantar calcaneus on the right and a more superficial area on the dorsal foot. He is using a scooter for offloading 11/30/2022: The dorsal foot wound continues to contract. There is some slough on the wound surface. The large calcaneus wound has heaped up wet callus around the margins. Continued undermining. 12/08/2022: The dorsal foot wound is flat and flush with the surrounding skin surface. There is some slough present. Once again, the calcaneal wound has thick, absolutely macerated callus hanging off in tatters around the edges. The patient cannot explain to me why this part of his wound gets so wet. There is slough on the surface. The undermined portion of the wound has filled in. 12/17/2022: Earlier this week, he presented for his hyperbaric oxygen therapy and was hypotensive and ill-appearing. He also had what appeared to be an infected sebaceous cyst on his right upper arm. He was sent to the emergency department. He was given a fluid bolus and the ED provider lanced the cyst. He was prescribed doxycycline. He seems to be feeling better today. He notes that he has had copious drainage from his foot. On further questioning, he states that he has not been taking his oral diuretic. The dorsal foot wound has expanded somewhat, but is actually more superficial. The plantar heel wound is about the same. 12/27/2022: The dorsal foot wound has epithelialized further. The plantar foot wound is about the same size but has heaped up macerated callus around the perimeter. The intake nurse noted a tiny fragment of bone on his dressings and there is now an area at about the 5 o'clock position where 1 can palpate bone through a small slit in the soft tissues. The wound on his arm is contracting but he still likely has cyst wall present due to the manner in which the infected sebaceous cyst was addressed in the ER. 01/01/2023: The wound on his shoulder has  contracted considerably. The dorsal foot wound has some slough on the surface but also looks to be improving. The plantar foot wound is basically unchanged. 01/10/2023: The wound on his shoulder continues to diminish in size and depth. The dorsal foot wound is improving  with just a light layer of slough on the surface. The plantar foot wound appears to be contracting but still has thick wet callus around the perimeter. We did in chamber T cPO2 monitoring during his last hyperbaric oxygen therapy. The results show that he has extremely poor tissue perfusion at room air and 1 atm of pressure. He does respond extremely well to hyperbaric oxygen therapy, however. Electronic Signature(s) Signed: 01/10/2023 9:28:31 AM By: [name] Maudlin MD FACS Entered By: [name] on 01/10/2023 09:28:31 [name] ([id number] Page 4 of 13 -------------------------------------------------------------------------------- Physical Exam Details Patient Name: Date of Service: [name], [name] [name] 01/10/2023 8:30 A M Medical Record Number: [id number] Patient Account Number: [id number] Date of Birth/Sex: Treating RN: 01-24-1956 (67 y.o. M) Primary Care Provider: Nelda [name] Other Clinician: Referring Provider: Treating Provider/Extender: [name] in Treatment: 17 Constitutional Hypertensive, asymptomatic. Slightly tachycardic. . . no acute distress. Respiratory Normal work of breathing on room air. Notes 01/10/2023: The wound on his shoulder continues to diminish in size and depth. The dorsal foot wound is improving with just a light layer of slough on the surface. The plantar foot wound appears to be contracting but still has thick wet callus around the perimeter. Electronic Signature(s) Signed: 01/10/2023 9:29:06 AM By: [name] Maudlin MD FACS Entered By: [name] on 01/10/2023  09:29:06 -------------------------------------------------------------------------------- Physician Orders Details Patient Name: Date of Service: [name], [name] 01/10/2023 8:30 A M Medical Record Number: [id number] Patient Account Number: [id number] Date of Birth/Sex: Treating RN: [birth year] (67 y.o. 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 - Dr. Celine [name] rm 4 Return A Monday 01/17/23 at 8:30am ppointment in 2 weeks. - Dr. Celine [name] rm 4 Return A Monday 01/24/23 at 8:30am [employer] Other [employer] Orders/Instructions: - run insurance for Apligraf Bathing/ Shower/ Hygiene May shower with protection but do not get wound dressing(s) wet. Protect dressing(s) with water repellant cover (for example, large plastic bag) or a cast cover and may then take shower. Negative Presssure Wound Therapy KYLLE, FOSKETT ([id number]) [id number] Page 5 of 13 Wound Vac to wound continuously at 165m/hg pressure - to left calcaneus Black Foam Edema Control - Lymphedema / SCD / Other Left Lower Extremity Avoid standing for long periods of time. Off-Loading Wound #1 Left Calcaneus Other: - use knee scooter to ambulate, minimal weight bearing right  foot Home Health Dressing changes to be completed by HFresnoon Monday / Wednesday / Friday except [facility] Other [facility] Orders/Instructions: - [facility] Hyperbaric Oxygen Therapy Wound #1 Left Calcaneus Evaluate for HBO Therapy - Continue HBOT for additional 20 treatments for total of 60 treatments Indication: - chronic refractory osteomyelitis If appropriate for treatment, begin HBOT per protocol: 2.5 ATA for 90 Minutes with 2 Five (5) Minute A Breaks ir Total Number of Treatments: - 60 One  treatments per day (delivered Monday through Friday unless otherwise specified in Special Instructions below): Finger stick Blood Glucose Pre- and Post- HBOT Treatment. Follow Hyperbaric Oxygen Glycemia Protocol A frin (Oxymetazoline HCL) 0.05% nasal spray - 1 spray in both nostrils daily as needed prior to HBO treatment for difficulty clearing ears Wound Treatment Wound #1 - Calcaneus Wound Laterality: Left Cleanser: Normal Saline 3 x Per Week/30 Days Discharge Instructions: Cleanse the wound with Normal Saline prior to applying a clean dressing using gauze sponges, not tissue or cotton balls. Cleanser: Soap and Water 3 x Per Week/30 Days Discharge Instructions: May shower and wash wound with dial antibacterial soap and water prior to dressing change. Peri-Wound Care: keystone 3 x Per Week/30 Days Prim Dressing: VAC ary 3 x Per Week/30 Days Secured With: Coban Self-Adherent Wrap 4x5 (in/yd) 3 x Per Week/30 Days Discharge Instructions: Secure with Coban as directed. Secured With: [employer], 4.5x3.1 (in/yd) 3 x Per Week/30 Days Discharge Instructions: Secure with Kerlix as directed. Wound #2 - Foot Wound Laterality: Dorsal, Left Cleanser: Normal Saline Discharge Instructions: Cleanse the wound with Normal Saline prior to applying a clean dressing using gauze sponges, not tissue or cotton balls. Cleanser: Soap and Water Discharge  Instructions: May shower and wash wound with dial antibacterial soap and water prior to dressing change. Peri-Wound Care: keystone Prim Dressing: Promogran Prisma Matrix, 4.34 (sq in) (silver collagen) ary Discharge Instructions: Moisten collagen with hydrogel Secondary Dressing: Zetuvit Plus Silicone Border Dressing 4x4 (in/in) Discharge Instructions: Apply silicone border over primary dressing as directed. Secured With: Coban Self-Adherent Wrap 4x5 (in/yd) Discharge Instructions: Secure with Coban as directed. Secured With: [employer], 4.5x3.1 (in/yd) Discharge Instructions: Secure with Kerlix as directed. Wound #3 - Shoulder Wound Laterality: Right Cleanser: Soap and Water 1 x Per Day/30 Days Discharge Instructions: May shower and wash wound with dial antibacterial soap and water prior to dressing change. Cleanser: Wound Cleanser 1 x Per Day/30 Days Discharge Instructions: Cleanse the wound with wound cleanser prior to applying a clean dressing using gauze sponges, not tissue or cotton balls. Peri-Wound Care: Skin Prep 1 x Per Day/30 Days Discharge Instructions: Use skin prep as directed [name], [name] ([id number]) [phone].pdf Page 6 of 13 Prim Dressing: Iodoform packing strip 1/4 (in) 1 x Per Day/30 Days ary Discharge Instructions: Lightly pack as instructed Secondary Dressing: Zetuvit Plus Silicone Border Dressing 4x4 (in/in) 1 x Per Day/30 Days Discharge Instructions: Apply silicone border over primary dressing as directed. GLYCEMIA INTERVENTIONS PROTOCOL PRE-HBO GLYCEMIA INTERVENTIONS ACTION INTERVENTION Obtain pre-HBO capillary blood glucose (ensure 1 physician order is in chart). A. Notify HBO physician and await physician orders. 2 If result is 70 mg/dl or below: B. If the result meets the hospital definition of a critical result, follow hospital policy. A. Give patient an 8 ounce Glucerna Shake, an 8 ounce Ensure, or 8 ounces of  a Glucerna/Ensure equivalent dietary supplement*. B. Wait 30 minutes. If result is 71 mg/dl to 130 mg/dl: C. Retest patients capillary blood glucose (CBG). D. If result greater than or equal to 110 mg/dl, proceed with HBO. If result less than 110 mg/dl, notify HBO physician and consider holding HBO. If result is 131 mg/dl to 249 mg/dl: A. Proceed with HBO. A. Notify HBO physician and await physician orders. B. It is recommended to hold HBO and do If result is 250 mg/dl or greater: blood/urine ketone testing. C. If the result meets the hospital definition of a critical result, follow hospital policy. POST-HBO GLYCEMIA INTERVENTIONS  ACTION INTERVENTION Obtain post HBO capillary blood glucose (ensure 1 physician order is in chart). A. Notify HBO physician and await physician orders. 2 If result is 70 mg/dl or below: B. If the result meets the hospital definition of a critical result, follow hospital policy. A. Give patient an 8 ounce Glucerna Shake, an 8 ounce Ensure, or 8 ounces of a Glucerna/Ensure equivalent dietary supplement*. B. Wait 15 minutes for symptoms of If result is 71 mg/dl to 100 mg/dl: hypoglycemia (i.e. nervousness, anxiety, sweating, chills, clamminess, irritability, confusion, tachycardia or dizziness). C. If patient asymptomatic, discharge patient. If patient symptomatic, repeat capillary blood glucose (CBG) and notify HBO physician. If result is 101 mg/dl to 249 mg/dl: A. Discharge patient. A. Notify HBO physician and await physician orders. B. It is recommended to do blood/urine ketone If result is 250 mg/dl or greater: testing. C. If the result meets the hospital definition of a critical result, follow hospital policy. *Juice or candies are NOT equivalent products. If patient refuses the Glucerna or Ensure, please consult the hospital dietitian for an appropriate substitute. Electronic Signature(s) Signed: 01/13/2023 4:07:20 PM By: [name] Maudlin MD FACS Signed: 01/23/2023 7:14:52 PM By: Donavan Burnet CHT EMT BS , , Previous Signature: 01/11/2023 5:44:44 PM Version By: Baruch Gouty RN, BSN Previous Signature: 01/12/2023 9:31:25 AM Version By: [name] Maudlin MD FACS Previous Signature: 01/10/2023 4:55:08 PM Version By: [name] Maudlin MD FACS Previous Signature: 01/10/2023 4:55:35 PM Version By: [name] East RN Previous Signature: 01/10/2023 9:39:22 AM Version By: [name] Maudlin MD FACS Previous Signature: 01/10/2023 8:51:50 AM Version By: [name] East RN Entered By: Donavan Burnet on 01/13/2023 16:05:14 Problem List Details -------------------------------------------------------------------------------- [name], [name] ([id number]) [id number] Page 7 of 13 Patient Name: Date of Service: [place], [name] 01/10/2023 8:30 A M Medical Record Number: [id number] Patient Account Number: [id number] Date of Birth/Sex: Treating RN: [birth year] (67 y.o. 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 09/10/2022 No Yes L97.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 09/10/2022 No Yes L98.492 Non-pressure chronic ulcer of skin of other sites with fat layer exposed 12/17/2022 No Yes L72.3 Sebaceous cyst 12/17/2022 No Yes [id number] Other chronic osteomyelitis, left ankle and foot 09/10/2022 No Yes E11.65 Type 2 diabetes mellitus with hyperglycemia 09/10/2022 No Yes E11.621 Type 2 diabetes mellitus with foot ulcer 09/10/2022 No Yes Inactive Problems Resolved Problems Electronic Signature(s) Signed: 01/10/2023 9:26:18 AM By: [name] Maudlin MD FACS Entered By: [name] on 01/10/2023  09:26:18 -------------------------------------------------------------------------------- Progress Note Details Patient Name: Date of Service: [name], [name] 01/10/2023 8:30 A M Medical Record Number: [id number] Patient Account Number: [id number] Date of Birth/Sex: Treating RN: Oct 25, 1956 (67 y.o. M) Primary Care Provider: Nelda [name] Other Clinician: Referring Provider: Treating Provider/Extender: [name] in Treatment: 27 Subjective Chief Complaint Information obtained from Patient Patients presents for treatment of an open diabetic ulcer and evaluation for hyperbaric oxygen therapy History of Present Illness (HPI) [place], [place] ([id number]) [phone].pdf Page 8 of 13 09/10/2022 This is a 67 year old poorly controlled type II diabetic (last hemoglobin A1c 10.8%) who has had an ulcer on his heel for over 3 years. He has been seen in multiple wound care centers, including [facility] and [facility]. He reports that  at least 3 doctors have recommended that he undergo below-knee amputation. He most recently met with [name], a vascular surgeon affiliated with [facility]. 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 Unfortunately, the patient has some extenuating social circumstances including the fact that he cares for his wife who has stage IV colon cancer and still works, driving vehicles for [employer]. 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 He was recently hospitalized at [facility] for sepsis and currently has a PICC line through which he receives IV antibiotics. He reports having had another MRI during that hospital stay along with a chest x-ray and EKG. He apparently contacted one of the hyperbaric therapy techs here and asked a number of questions about hyperbaric oxygen treatments.  He subsequently self-referred to our center to undergo further evaluation and management. I mention to him that [facility] actually has hyperbaric chambers, but he states that he lives in [place] and this would be more convenient for him given the intensive nature of the therapy and time requirement. ABI in clinic today was 0.94. The patient actually has 2 wounds. There is a wound on the dorsum of his left foot with heavy black eschar and slough present. After debridement, this was demonstrated to involve the muscle and the extensor tendons are exposed. On his heel, there is essentially a "shark bite" type wound, with much of the heel fat pad absent. The muscle layer is exposed. There is blue-green staining around the perimeter of the wound, but no significant odor. He says he has been applying collagen to the wound on his heel and Silvadene and Betadine to the wound on his dorsal foot. 09/20/2022: The heel wound is quite macerated with wet periwound callus. There is slough accumulation on the surface. The dorsal foot wound looks better this week. There is still exposed tendon, but it is fairly clean with just a little biofilm buildup. We are still working on gathering the required documentation to submit for pretreatment review for hyperbaric oxygen therapy. 09/29/2022: The dorsal foot wound continues to improve. I do not see any exposed tendon at this point. There is just some slough accumulation on the wound surface. He continues to have very wet macerated periwound callus on his heel. There is slough on the surface, but it is loose and thin. There is an area of undermining at the 11 o'clock position, but the overlying skin and subcutaneous tissue is healthy and viable. He has been approved for hyperbaric oxygen therapy and will start treatment tomorrow. 10/06/2022: Continued contraction and improvement of the dorsal foot wound. There is just a little bit of slough accumulation on the wound  surface. The periwound callus continues to accumulate on the heel wound and it is quite macerated. It is also persistently found with blue-green staining present. He did initiate his hyperbaric oxygen therapy, but has had significant difficulty with decompression. He will be going to ENT to have PE tubes placed. 10/18/2022: His hyperbaric oxygen therapy is on hold while his otological issues are being addressed. He came in again today with the periwound skin on both the dorsal aspect of his foot and his calcaneus completely macerated. The blue-green discoloration, however, has abated and the undermining on the calcaneus has improved. The dorsal foot wound has also contracted somewhat. 10/26/2022: The dorsal foot wound continues to contract and fill with good granulation tissue. The heel, once again, has a rim  of macerated callus, but the undermining and tunneling continues to contract. He has completed his oral antibiotics and has been using the [facility] topical compounded antibiotic for his dressing changes at home. He is scheduled to see ENT this afternoon. 11/08/2022: The dorsal foot wound is flush with the surrounding skin and has a good granulation tissue surface. There is some slough accumulation. As usual, the heel has a rim of macerated callus but the dimensions are smaller and the tunneling and undermining have contracted further. He has resumed his hyperbaric oxygen therapy. 12/12; patient seen for wound evaluation. He is tolerating HBO although we could not dive him yesterday because of relative hypoglycemia and the fact he had given himself NovoLog insulin before he came to clinic. Today his blood sugar is in the 180 range she should be fine. He has a large wound on the plantar calcaneus on the right and a more superficial area on the dorsal foot. He is using a scooter for offloading 11/30/2022: The dorsal foot wound continues to contract. There is some slough on the wound surface. The  large calcaneus wound has heaped up wet callus around the margins. Continued undermining. 12/08/2022: The dorsal foot wound is flat and flush with the surrounding skin surface. There is some slough present. Once again, the calcaneal wound has thick, absolutely macerated callus hanging off in tatters around the edges. The patient cannot explain to me why this part of his wound gets so wet. There is slough on the surface. The undermined portion of the wound has filled in. 12/17/2022: Earlier this week, he presented for his hyperbaric oxygen therapy and was hypotensive and ill-appearing. He also had what appeared to be an infected sebaceous cyst on his right upper arm. He was sent to the emergency department. He was given a fluid bolus and the ED provider lanced the cyst. He was prescribed doxycycline. He seems to be feeling better today. He notes that he has had copious drainage from his foot. On further questioning, he states that he has not been taking his oral diuretic. The dorsal foot wound has expanded somewhat, but is actually more superficial. The plantar heel wound is about the same. 12/27/2022: The dorsal foot wound has epithelialized further. The plantar foot wound is about the same size but has heaped up macerated callus around the perimeter. The intake nurse noted a tiny fragment of bone on his dressings and there is now an area at about the 5 o'clock position where 1 can palpate bone through a small slit in the soft tissues. The wound on his arm is contracting but he still likely has cyst wall present due to the manner in which the infected sebaceous cyst was addressed in the ER. 01/01/2023: The wound on his shoulder has contracted considerably. The dorsal foot wound has some slough on the surface but also looks to be improving. The plantar foot wound is basically unchanged. 01/10/2023: The wound on his shoulder continues to diminish in size and depth. The dorsal foot wound is improving with  just a light layer of slough on the surface. The plantar foot wound appears to be contracting but still has thick wet callus around the perimeter. We did in chamber T cPO2 monitoring during his last hyperbaric oxygen therapy. The results show that he has extremely poor tissue perfusion at room air and 1 atm of pressure. He does respond extremely well to hyperbaric oxygen therapy, however. Patient History Family History Cancer - Father,Mother, Diabetes - Mother,Father, Lung Disease -  Father, Thyroid Problems - Mother, No family history of Heart Disease, Hereditary Spherocytosis, Hypertension, Kidney Disease, Seizures, Stroke, Tuberculosis. Social History Never smoker, Marital Status - Married, Alcohol Use - Never, Drug Use - No History, Caffeine Use - Daily. Medical History Eyes Patient has history of Cataracts Denies history of Glaucoma, Optic Neuritis Ear/Nose/Mouth/Throat Denies history of Chronic sinus problems/congestion, Middle ear problems Respiratory Patient has history of Sleep Apnea Cardiovascular Patient has history of Hypertension [name], [name] ([id number]) [phone].pdf Page 9 of 13 Gastrointestinal Denies history of Cirrhosis , Colitis, Crohnoos, Hepatitis A, Hepatitis B, Hepatitis C Endocrine Patient has history of Type II Diabetes Immunological Denies history of Lupus Erythematosus, Raynaudoos, Scleroderma Musculoskeletal Patient has history of Osteomyelitis - 2023 Neurologic Patient has history of Neuropathy - Bila lower extremities Oncologic Denies history of Received Chemotherapy, Received Radiation Psychiatric Denies history of Anorexia/bulimia, Confinement Anxiety Hospitalization/Surgery History - I and D Left calcaneus. - back surgery- laminectomy. - eye surgery- Bila cataracts. - shoulder arthroscopy. Medical A Surgical History Notes nd Cardiovascular hyperlipidemia Genitourinary AKI Objective Constitutional Hypertensive,  asymptomatic. Slightly tachycardic. no acute distress. Vitals Time Taken: 8:28 AM, Height: 74 in, Weight: 245 lbs, BMI: 31.5, Temperature: 97.6 F, Pulse: 105 bpm, Respiratory Rate: 18 breaths/min, Blood Pressure: 179/83 mmHg, Capillary Blood Glucose: 301 mg/dl. Respiratory Normal work of breathing on room air. General Notes: 01/10/2023: The wound on his shoulder continues to diminish in size and depth. The dorsal foot wound is improving with just a light layer of slough on the surface. The plantar foot wound appears to be contracting but still has thick wet callus around the perimeter. Integumentary (Hair, Skin) Wound #1 status is Open. Original cause of wound was Pressure Injury. The date acquired was: 03/09/2019. The wound has been in treatment 17 weeks. The wound is located on the Left Calcaneus. The wound measures 6cm length x 5cm width x 0.6cm depth; 23.562cm^2 area and 14.137cm^3 volume. There is bone and Fat Layer (Subcutaneous Tissue) exposed. There is no tunneling or undermining noted. There is a large amount of serosanguineous drainage noted. The wound margin is thickened. There is large (67-100%) red, pale granulation within the wound bed. There is a small (1-33%) amount of necrotic tissue within the wound bed including Adherent Slough. The periwound skin appearance had no abnormalities noted for color. The periwound skin appearance exhibited: Callus, Maceration. Periwound temperature was noted as No Abnormality. Wound #2 status is Open. Original cause of wound was Pressure Injury. The date acquired was: 07/09/2022. The wound has been in treatment 17 weeks. The wound is located on the Left,Dorsal Foot. The wound measures 2.2cm length x 2.6cm width x 0.1cm depth; 4.492cm^2 area and 0.449cm^3 volume. There is Fat Layer (Subcutaneous Tissue) exposed. There is no tunneling or undermining noted. There is a medium amount of serosanguineous drainage noted. The wound margin is flat and intact.  There is small (1-33%) red granulation within the wound bed. There is a large (67-100%) amount of necrotic tissue within the wound bed including Adherent Slough. The periwound skin appearance had no abnormalities noted for moisture. The periwound skin appearance had no abnormalities noted for color. The periwound skin appearance exhibited: Scarring. Periwound temperature was noted as No Abnormality. Wound #3 status is Open. Original cause of wound was Gradually Appeared. The date acquired was: 12/14/2022. The wound has been in treatment 3 weeks. The wound is located on the Right Shoulder. The wound measures 0.2cm length x 0.3cm width x 1cm depth; 0.047cm^2 area and 0.047cm^3 volume. There is  Fat Layer (Subcutaneous Tissue) exposed. There is no undermining noted, however, there is tunneling at 6:00 with a maximum distance of 0.8cm. There is a medium amount of serosanguineous drainage noted. The wound margin is distinct with the outline attached to the wound base. There is no granulation within the wound bed. There is no necrotic tissue within the wound bed. The periwound skin appearance had no abnormalities noted for moisture. The periwound skin appearance had no abnormalities noted for color. The periwound skin appearance exhibited: Rash. Periwound temperature was noted as No Abnormality. 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 There was a Selective/Open Wound Skin/Epidermis Debridement with a total area of 30 sq cm performed by [name] Maudlin, MD. With the following instrument(s): Curette to remove Non-Viable tissue/material. Material removed includes Callus, Skin: Epidermis, and Biofilm. No specimens were taken. A time out was conducted at 08:58, prior to the start of the procedure. A Minimum amount of bleeding was controlled with Pressure. The procedure was tolerated well. Post Debridement Measurements: 6cm length x 5cm width x 0.6cm depth; 14.137cm^3 volume. Character of Wound/Ulcer Post Debridement requires further debridement. Severity of Tissue Post Debridement is: Fat layer exposed. Post procedure Diagnosis Wound #1: Same as Pre-Procedure General Notes: Scribed for Dr. Celine [name] by [name] East, RN. 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 There was a Selective/Open Wound Non-Viable Tissue Debridement with a total area of 5.72 sq cm performed by [name] Maudlin, MD. With the following instrument(s): Curette to remove Non-Viable tissue/material. Material removed includes [facility]. No specimens were taken. A time out was conducted at 08:58, prior to the start of the procedure. A Minimum amount of bleeding was controlled with Pressure. The procedure was tolerated well. Post Debridement Measurements: 2.2cm length x 2.6cm width x 0.1cm depth; 0.449cm^3 volume. Character of Wound/Ulcer Post Debridement requires further debridement. Severity of Tissue Post Debridement is: Fat layer exposed. Post procedure Diagnosis Wound #2: Same as Pre-Procedure General Notes: Scribed for Dr. Celine [name] by [name] East, RN. Plan Follow-up Appointments: Return Appointment in 1 week. - Dr. Celine [name] rm 4 Monday 01/17/23 at 8:30am Return  Appointment in 2 weeks. - Dr. Celine [name] rm 4 Monday 01/24/23 at 8:30am Cellular or Tissue Based Products: Other Cellular or Tissue Based Products Orders/Instructions: - run insurance for Apligraf Bathing/ Shower/ Hygiene: May shower with protection but do not get wound dressing(s) wet. Protect dressing(s) with water repellant cover (for example, large plastic bag) or a cast cover and may then take shower. Negative Presssure Wound Therapy: Wound Vac to wound continuously at 182m/hg pressure - to left calcaneus Black Foam Edema Control - Lymphedema / SCD / Other: Avoid standing for long periods of time. Off-Loading: Wound #1 Left Calcaneus: Other: - use knee scooter to ambulate, minimal weight bearing right foot Home Health: Dressing changes to be completed by [facility]  on Monday / Wednesday / Friday except when patient has scheduled visit at [facility]. Other Home Health Orders/Instructions: - [facility] Hyperbaric Oxygen Therapy: Wound #1 Left Calcaneus: Evaluate for HBO Therapy - Continue HBOT for additional 20 treatments for total of 60 treatments Indication: - chronic refractory osteomyelitis If appropriate for treatment, begin HBOT per protocol: 2.5 ATA for 90 Minutes with 2 Five (5) Minute Air Breaks T Number of Treatments: - 40 otal One treatments per day (delivered Monday through Friday unless otherwise specified in Special Instructions below): Finger stick Blood Glucose Pre- and Post- HBOT Treatment. Follow Hyperbaric Oxygen Glycemia Protocol Afrin (Oxymetazoline HCL) 0.05% nasal spray - 1 spray in both nostrils daily as needed prior to HBO treatment for difficulty clearing ears WOUND #1: - Calcaneus Wound Laterality: Left Cleanser: Normal Saline 3 x Per Week/30 Days Discharge Instructions: Cleanse the wound with Normal Saline prior to applying a clean dressing using gauze sponges, not tissue or cotton balls. Cleanser: Soap and Water 3 x Per Week/30 Days Discharge  Instructions: May shower and wash wound with dial antibacterial soap and water prior to dressing change. Peri-Wound Care: keystone 3 x Per Week/30 Days Prim Dressing: VAC 3 x Per Week/30 Days ary Secured With: Coban Self-Adherent Wrap 4x5 (in/yd) 3 x Per Week/30 Days Discharge Instructions: Secure with Coban as directed. Secured With: [employer], 4.5x3.1 (in/yd) 3 x Per Week/30 Days Discharge Instructions: Secure with Kerlix as directed. WOUND #2: - Foot Wound Laterality: Dorsal, Left Cleanser: Normal Saline Discharge Instructions: Cleanse the wound with Normal Saline prior to applying a clean dressing using gauze sponges, not tissue or cotton balls. Cleanser: Soap and Water Discharge Instructions: May shower and wash wound with dial antibacterial soap and water prior to dressing change. Peri-Wound Care: keystone Prim Dressing: Promogran Prisma Matrix, 4.34 (sq in) (silver collagen) ary Discharge Instructions: Moisten collagen with hydrogel Secondary Dressing: Zetuvit Plus Silicone Border Dressing 4x4 (in/in) Discharge Instructions: Apply silicone border over primary dressing as directed. Secured With: Coban Self-Adherent Wrap 4x5 (in/yd) Discharge Instructions: Secure with Coban as directed. Secured With: [employer], 4.5x3.1 (in/yd) Discharge Instructions: Secure with Kerlix as directed. WOUND #3: - Shoulder Wound Laterality: Right Cleanser: Soap and Water 1 x Per Day/30 Days Discharge Instructions: May shower and wash wound with dial antibacterial soap and water prior to dressing change. Cleanser: Wound Cleanser 1 x Per Day/30 Days Discharge Instructions: Cleanse the wound with wound cleanser prior to applying a clean dressing using gauze sponges, not tissue or cotton balls. Peri-Wound Care: Skin Prep 1 x Per Day/30 Days [name], [name] ([id number]) [phone].pdf Page 11 of 13 Discharge Instructions: Use skin prep as directed Prim Dressing:  Iodoform packing strip 1/4 (in) 1 x Per Day/30 Days ary Discharge Instructions: Lightly pack as instructed Secondary Dressing: Zetuvit Plus Silicone Border Dressing 4x4 (in/in) 1 x Per Day/30 Days Discharge Instructions: Apply silicone border over primary dressing as directed. 01/10/2023: The wound on his shoulder continues to diminish in size and depth. The dorsal foot wound is improving with just a light layer of slough on the surface. The plantar foot wound appears to be contracting but still has thick wet callus around the perimeter. The shoulder wound did not require any debridement. We will continue to pack it with iodoform packing strips. I debrided slough off of the dorsal foot wound. Continue Prisma silver collagen and Keystone topical antibiotic compound. I debrided callus and biofilm off of the heel. Continue Keystone and wound VAC. We will continue  his hyperbaric oxygen therapy. 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 This does not tell me whether or not he has adequate blood flow into the foot itself. It may be that he needs revascularization for there to be any chance to preserve his foot. Continue hyperbaric oxygen therapy in the interim. Follow-up in 1 week. Electronic Signature(s) Signed: 01/11/2023 5:44:44 PM By: Baruch Gouty RN, BSN Signed: 01/12/2023 9:31:25 AM By: [name] Maudlin MD FACS Previous Signature: 01/10/2023 9:31:49 AM Version By: [name] Maudlin MD FACS Entered By: Baruch Gouty on 01/11/2023 17:35:36 -------------------------------------------------------------------------------- HxROS Details Patient Name: Date of Service: [name], [name] 01/10/2023 8:30 A M Medical Record Number: [id number] Patient Account Number: [id number] Date of Birth/Sex: Treating  RN: 1956/06/30 (67 y.o. M) Primary Care Provider: Nelda [name] Other Clinician: Referring Provider: Treating Provider/Extender: [name] in Treatment: 17 Eyes Medical History: Positive for: Cataracts Negative for: Glaucoma; Optic Neuritis Ear/Nose/Mouth/Throat Medical History: Negative for: Chronic sinus problems/congestion; Middle ear problems Respiratory Medical History: Positive for: Sleep Apnea Cardiovascular Medical History: Positive for: Hypertension Past Medical History Notes: hyperlipidemia Gastrointestinal Medical History: Negative for: Cirrhosis ; Colitis; Crohns; Hepatitis A; Hepatitis B; Hepatitis C Endocrine Medical History: Positive for: Type II Diabetes Time with diabetes: 20 years Treated with: Insulin Blood sugar tested every day: Yes Tested : 2-3 [name], [name] ([id number]) [phone].pdf Page 12 of 13 Genitourinary Medical History: Past Medical History Notes: AKI Immunological Medical History: Negative for: Lupus Erythematosus; Raynauds; Scleroderma Musculoskeletal Medical History: Positive for: Osteomyelitis - 2023 Neurologic Medical History: Positive for: Neuropathy - Bila lower extremities Oncologic Medical History: Negative for: Received Chemotherapy; Received Radiation Psychiatric Medical History: Negative for: Anorexia/bulimia; Confinement Anxiety HBO Extended History Items Eyes: Cataracts Immunizations Pneumococcal Vaccine: Received Pneumococcal Vaccination: Yes Received Pneumococcal Vaccination On or After 60th Birthday: Yes Implantable Devices Yes Hospitalization / Surgery History Type of Hospitalization/Surgery I and D Left calcaneus back surgery- laminectomy eye surgery- Bila cataracts shoulder arthroscopy Family and Social History Cancer: Yes - Father,Mother; Diabetes: Yes - Mother,Father; Heart Disease: No; Hereditary Spherocytosis: No; Hypertension: No;  Kidney Disease: No; Lung Disease: Yes - Father; Seizures: No; Stroke: No; Thyroid Problems: Yes - Mother; Tuberculosis: No; Never smoker; Marital Status - Married; Alcohol Use: Never; Drug Use: No History; Caffeine Use: Daily; Financial Concerns: No; Food, Clothing or Shelter Needs: No; Support System Lacking: No; Transportation Concerns: No Electronic Signature(s) Signed: 01/10/2023 9:39:22 AM By: [name] Maudlin MD FACS Entered By: [name] on 01/10/2023 09:28:38 -------------------------------------------------------------------------------- SuperBill Details Patient Name: Date of Service: [name], [name] 01/10/2023 Medical Record Number: [id number] Patient Account Number: [id number] Date of Birth/Sex: Treating RN: [birth year] (67 y.o. 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 L98.492 Non-pressure chronic ulcer of skin of other sites with fat layer exposed L72.3 Sebaceous cyst [id number] Other chronic osteomyelitis, left ankle and foot E11.65 Type 2 diabetes mellitus with hyperglycemia E11.621 Type 2 diabetes mellitus with foot ulcer Facility Procedures : The patient participates with Medicare  or their insurance follows the Medicare Facility Guidelines: CPT4 Code Description Modifier Quantity [id number] - DEBRIDE WOUND 1ST 20 SQ CM OR < 1 ICD-10 Diagnosis Description L97.424 Non-pressure chronic ulcer  of left heel and midfoot with necrosis of bone L97.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 : The patient participates  with Medicare or their insurance follows the Medicare Facility Guidelines: [id number] - DEBRIDE WOUND EA ADDL 20 SQ CM 1 ICD-10 Diagnosis Description 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 L97.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 : The patient participates with Medicare or their insurance follows the Medicare Facility Guidelines: [id number] - WOUND VAC-50 SQ CM OR LESS 1 Physician Procedures : CPT4 Code Description Modifier [id number] - WC PHYS LEVEL 4 - EST PT 25 ICD-10 Diagnosis Description 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 L98.492 Non-pressure chronic ulcer of skin of other sites with fat layer exposed E11.621 Type 2 diabetes mellitus with foot ulcer Quantity: 1 : [id number] - WC PHYS DEBR WO ANESTH 20 SQ CM ICD-10 Diagnosis Description 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

## 2023-01-10 NOTE — Progress Notes (Signed)
[name], [name] ([id number]) [id number] Page 1 of 2 Visit Report for 01/10/2023 Arrival Information Details Patient Name: Date of Service: [place], [name] 01/10/2023 10:00 A M Medical Record Number: [id number] Patient Account Number: [id number] Date of Birth/Sex: Treating RN: [birth year] (67 y.o. [name], Meta.Reding Primary Care Daniella Dewberry: Nelda Bucks Other Clinician: Donavan Burnet Referring Clemence Lengyel: Treating Sallie Maker/Extender: Doristine Bosworth in Treatment: 33 Visit Information History Since Last Visit All ordered tests and consults were completed: Yes Patient Arrived: Knee Scooter Added or deleted any medications: No Arrival Time: 08:22 Any new allergies or adverse reactions: No Accompanied By: self Had a fall or experienced change in No Transfer Assistance: None activities of daily living that may affect Patient Identification Verified: Yes risk of falls: Secondary Verification Process Completed: Yes Signs or symptoms of abuse/neglect since last visito No Patient Requires Transmission-Based Precautions: No Hospitalized since last visit: No Patient Has Alerts: No Implantable device outside of the clinic excluding No cellular tissue based products placed in the center since last visit: Pain Present Now: No Electronic Signature(s) Signed: 01/10/2023 3:58:29 PM By: Donavan Burnet CHT EMT BS , , Entered By: Donavan Burnet on 01/10/2023 15:58:29 -------------------------------------------------------------------------------- Encounter Discharge Information Details Patient Name: Date of Service: [name], [name] 01/10/2023 10:00 A M Medical Record Number: [id number] Patient Account Number: [id number] Date of Birth/Sex: Treating RN: 12-Aug-1956 (67 y.o. [name], [name] Primary Care Westlynn Fifer: Nelda Bucks Other Clinician: Donavan Burnet Referring Quinterrius Errington: Treating Roshawnda Pecora/Extender: Doristine Bosworth in Treatment: 17 Encounter Discharge Information Items Discharge Condition: Stable Ambulatory Status: Knee Scooter Discharge Destination: Home Transportation: Private Auto Accompanied By: self Schedule Follow-up Appointment: No Clinical Summary of Care: Electronic Signature(s) Signed: 01/10/2023 4:06:38 PM By: Donavan Burnet CHT EMT BS , , Entered By: Donavan Burnet on 01/10/2023 Whiting, Monona ([id number]) [id number] Page 2 of 2 -------------------------------------------------------------------------------- Vitals Details Patient Name: Date of Service: [name], [name] 01/10/2023 10:00 A M Medical Record Number: [id number] Patient Account Number: [id number] Date of Birth/Sex: Treating RN: Mar 07, 1956 (67 y.o. [name], Meta.Reding Primary Care Frutoso Dimare: Nelda Bucks Other Clinician: Donavan Burnet Referring Chimaobi Casebolt: Treating Ashya Nicolaisen/Extender: Doristine Bosworth in Treatment: 17 Vital Signs Time Taken: 09:58 Temperature (F): 97.9 Height (in): 74 Pulse (bpm): 90 Weight (lbs): 245 Respiratory Rate (breaths/min): 18 Body Mass Index (BMI): 31.5 Blood Pressure (mmHg): 137/79 Capillary Blood Glucose (mg/dl): 255 Reference Range: 80 - 120 mg / dl Electronic Signature(s) Signed: 01/10/2023 3:59:08 PM By: Donavan Burnet CHT EMT BS , , Entered By: Donavan Burnet on 01/10/2023 15:59:08

## 2023-01-10 NOTE — Progress Notes (Signed)
TIARA, BARTOLI R ([id number]) [id number] Page 1 of 1 Visit Report for 01/10/2023 SuperBill Details Patient Name: Date of Service: [name], [name] [name] 01/10/2023 Medical Record Number: [id number] Patient Account Number: [id number] Date of Birth/Sex: Treating RN: October 23, 1956 (67 y.o. 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, left ankle and foot E11.65 Type 2 diabetes mellitus with hyperglycemia E11.621 Type 2 diabetes mellitus with foot ulcer Facility Procedures The patient participates with Medicare or their insurance follows the Medicare Facility Guidelines CPT4 Code Description Modifier Quantity [id number] G0277-(Facility Use Only) HBOT full body chamber, 60min , 4 ICD-10 Diagnosis Description [phone] Other chronic osteomyelitis, 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 Physician Procedures Quantity CPT4 Code Description Modifier [id number] 56433 - WC PHYS HYPERBARIC OXYGEN THERAPY 1 ICD-10 Diagnosis Description 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, 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

## 2023-01-11 ENCOUNTER — Encounter (HOSPITAL_BASED_OUTPATIENT_CLINIC_OR_DEPARTMENT_OTHER): Payer: Medicare Other | Admitting: General Surgery

## 2023-01-11 DIAGNOSIS — E11621 Type 2 diabetes mellitus with foot ulcer: Secondary | ICD-10-CM | POA: Diagnosis not present

## 2023-01-11 LAB — GLUCOSE, CAPILLARY
Glucose-Capillary: 174 mg/dL — ABNORMAL HIGH (ref 70–99)
Glucose-Capillary: 213 mg/dL — ABNORMAL HIGH (ref 70–99)
Glucose-Capillary: 225 mg/dL — ABNORMAL HIGH (ref 70–99)

## 2023-01-11 NOTE — Progress Notes (Signed)
TERRELL, OSTRAND [name] ([id number]) [id number] Page 1 of 2 Visit Report for 01/11/2023 HBO Details Patient Name: Date of Service: [place], [place] [name]. 01/11/2023 10:00 A M Medical Record Number: [id number] Patient Account Number: [id number] Date of Birth/Sex: Treating RN: Jul 10, 1956 (67 y.o. 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 HBO Treatment Details Treatment Number: 39 Patient Type: Outpatient Chamber Type: Monoplace Chamber Serial #: [id number] Treatment Protocol: 2.0 ATA with 90 minutes oxygen, with two 5 minute air breaks Treatment Details Compression Rate Down: 2.0 psi / minute De-Compression Rate Up: 2.0 psi / minute A breaks and breathing ir Compress Tx Pressure periods Decompress Decompress Begins Reached (leave unused spaces Begins Ends blank) Chamber Pressure (ATA 1 2 2 2 2 2  --2 1 ) Clock Time (24 hr) 10:41 10:53 11:23 11:28 11:58 12:03 - - 12:33 12:44 Treatment Length: 123 (minutes) Treatment Segments: 4 Vital Signs Capillary Blood Glucose Reference Range: 80 - 120 mg / dl HBO Diabetic Blood Glucose Intervention Range: <131 mg/dl or >249 mg/dl Time Vitals Blood Respiratory Capillary Blood Glucose Pulse Action Type: Pulse: Temperature: Taken: Pressure: Rate: Glucose (mg/dl): Meter #: Oximetry (%) Taken: Pre 10:05 213 Post 12:46 138/59 68 18 98.4 225 Pre 10:29 119/64 75 18 98.3 174 Treatment Response Treatment Toleration: Well Treatment Completion Status: Treatment Completed without Adverse Event Treatment Notes The patient used Afrin before the treatment. Physician HBO  Attestation: I certify that I supervised this HBO treatment in accordance with Medicare guidelines. A trained emergency response team is readily available per Yes hospital policies and procedures. Continue HBOT as ordered. Yes Electronic Signature(s) Signed: 01/12/2023 9:27:39 AM By: Fredirick Maudlin MD FACS Previous Signature: 01/11/2023 5:12:50 PM Version By: Valeria Batman EMT Entered By: Fredirick Maudlin on 01/12/2023 09:27:38 [name] ([id number]) [id number] Page 2 of 2 -------------------------------------------------------------------------------- HBO Safety Checklist Details Patient Name: Date of Service: [name], [place] [name]. 01/11/2023 10:00 A M Medical Record Number: [id number] Patient Account Number: [id number] Date of Birth/Sex: Treating RN: August 04, 1956 (67 y.o. [name], [name] Primary Care Syeda Prickett: Nelda Bucks Other Clinician: Valeria Batman Referring Kevyn Boquet: Treating Mayzee Reichenbach/Extender: Doristine Bosworth in Treatment: 17 HBO Safety Checklist Items Safety Checklist Consent Form Signed Patient voided / foley secured and emptied When did you last eato 0830 Last dose of injectable or oral agent 0830 Ostomy pouch emptied and vented if applicable NA All implantable devices assessed, documented and approved Tympanostomy tubes Intravenous access site secured and place NA Valuables secured Linens and cotton and cotton/polyester blend (less than 51% polyester) Personal oil-based products / skin lotions / body lotions removed Wigs or hairpieces removed NA Smoking or tobacco materials removed Books / newspapers / magazines / loose paper removed Cologne, aftershave, perfume and deodorant removed Jewelry removed (may wrap wedding band) Make-up removed NA Hair care products removed Battery operated devices (external) removed Heating patches and chemical warmers removed Titanium eyewear removed NA Nail polish cured  greater than 10 hours NA Casting material cured greater than 10 hours NA Hearing aids removed NA Loose dentures or partials removed removed by patient Prosthetics have been removed NA Patient demonstrates correct use of air break device (if applicable) Patient concerns have been addressed Patient grounding bracelet on and cord attached to chamber  Specifics for Inpatients (complete in addition to above) Medication sheet sent with patient NA Intravenous medications needed or due during therapy sent with patient NA Drainage tubes (e.g. nasogastric tube or chest tube secured and vented) NA Endotracheal or Tracheotomy tube secured NA Cuff deflated of air and inflated with saline NA Airway suctioned NA Notes The safety checklist was done before the treatment was started. Electronic Signature(s) Signed: 01/11/2023 5:09:54 PM By: Valeria Batman EMT Entered By: Valeria Batman on 01/11/2023 17:09:53

## 2023-01-11 NOTE — Progress Notes (Signed)
KOY, LAMP R ([id number]) [id number] Page 1 of 2 Visit Report for 01/11/2023 Arrival Information Details Patient Name: Date of Service: [place], [name] 01/11/2023 10:00 A M Medical Record Number: [id number] Patient Account Number: [id number] Date of Birth/Sex: Treating RN: [birth year] (67 y.o. [name], [name] Primary Care Zechariah Bissonnette: Nelda Bucks Other Clinician: Valeria Batman Referring Riley Hallum: Treating Alida Greiner/Extender: Doristine Bosworth in Treatment: 17 Visit Information History Since Last Visit All ordered tests and consults were completed: Yes Patient Arrived: Knee Scooter Added or deleted any medications: No Arrival Time: 09:54 Any new allergies or adverse reactions: No Accompanied By: None Had a fall or experienced change in No Transfer Assistance: None activities of daily living that may affect Patient Identification Verified: Yes risk of falls: Secondary Verification Process Completed: Yes Signs or symptoms of abuse/neglect since last visito No Patient Requires Transmission-Based Precautions: No Hospitalized since last visit: No Patient Has Alerts: No Implantable device outside of the clinic excluding No cellular tissue based products placed in the center since last visit: Pain Present Now: No Electronic Signature(s) Signed: 01/11/2023 5:07:07 PM By: Valeria Batman EMT Entered By: Valeria Batman on 01/11/2023 17:07:07 -------------------------------------------------------------------------------- Encounter Discharge Information Details Patient Name: Date of Service: [place], [name] 01/11/2023 10:00 A M Medical Record Number: [id number] Patient Account Number: [id number] Date of Birth/Sex: Treating RN: August 11, 1956 (67 y.o. [name] Primary Care Kameren Baade: Nelda Bucks Other Clinician: Valeria Batman Referring Licet Dunphy: Treating Jameelah Watts/Extender: Doristine Bosworth in  Treatment: 17 Encounter Discharge Information Items Discharge Condition: Stable Ambulatory Status: Knee Scooter Discharge Destination: Home Transportation: Private Auto Accompanied By: None Schedule Follow-up Appointment: Yes Clinical Summary of Care: Electronic Signature(s) Signed: 01/11/2023 5:14:20 PM By: Valeria Batman EMT Entered By: Valeria Batman on 01/11/2023 17:14:19 [name] ([id number]) [id number] Page 2 of 2 -------------------------------------------------------------------------------- Vitals Details Patient Name: Date of Service: [name], [name] 01/11/2023 10:00 A M Medical Record Number: [id number] Patient Account Number: [id number] Date of Birth/Sex: Treating RN: September 27, 1956 (67 y.o. [name] Primary Care Sanjit Mcmichael: Nelda Bucks Other Clinician: Valeria Batman Referring Mikhaela Zaugg: Treating Priest Lockridge/Extender: Doristine Bosworth in Treatment: 17 Vital Signs Time Taken: 10:05 Capillary Blood Glucose (mg/dl): 213 Height (in): 74 Reference Range: 80 - 120 mg / dl Weight (lbs): 245 Body Mass Index (BMI): 31.5 Electronic Signature(s) Signed: 01/11/2023 5:07:59 PM By: Valeria Batman EMT Entered By: Valeria Batman on 01/11/2023 17:07:59

## 2023-01-12 ENCOUNTER — Encounter (HOSPITAL_BASED_OUTPATIENT_CLINIC_OR_DEPARTMENT_OTHER): Payer: Medicare Other | Admitting: General Surgery

## 2023-01-12 NOTE — Progress Notes (Signed)
TROYE, HIEMSTRA R ([id number]) [id number] Page 1 of 2 Visit Report for 01/11/2023 Problem List Details Patient Name: Date of Service: [name], [name] [name] 01/11/2023 10:00 A M Medical Record Number: [id number] Patient Account Number: [id number] Date of Birth/Sex: Treating RN: 05-Dec-1956 (67 y.o. [name] Primary Care Provider: Nelda Bucks Other Clinician: Valeria Batman Referring Provider: Treating Provider/Extender: Doristine Bosworth in Treatment: 17 Active Problems ICD-10 Encounter Code Description Active Date MDM Diagnosis L97.424 Non-pressure chronic ulcer of left heel and midfoot with 09/10/2022 No Yes necrosis of bone L97.523 Non-pressure chronic ulcer of other part of left foot with 09/10/2022 No Yes necrosis of muscle L98.492 Non-pressure chronic ulcer of skin of other sites with fat 12/17/2022 No Yes layer exposed L72.3 Sebaceous cyst 12/17/2022 No Yes H67.591 Other chronic osteomyelitis, left ankle and foot 09/10/2022 No Yes E11.65 Type 2 diabetes mellitus with hyperglycemia 09/10/2022 No Yes E11.621 Type 2 diabetes mellitus with foot ulcer 09/10/2022 No Yes Inactive Problems Resolved Problems Electronic Signature(s) Signed: 01/11/2023 5:13:51 PM By: Valeria Batman EMT Signed: 01/12/2023 9:31:25 AM By: Fredirick Maudlin MD FACS Entered By: Valeria Batman on 01/11/2023 17:13:51 SuperBill Details -------------------------------------------------------------------------------- [name] ([id number]) [id number] Page 2 of 2 Patient Name: Date of Service: [name], [name] [name] 01/11/2023 Medical Record Number: [id number] Patient Account Number: [id number] Date of Birth/Sex: Treating RN: [birth year] (67 y.o. 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.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 L98.492 Non-pressure chronic ulcer of skin of other sites with fat layer exposed L72.3 Sebaceous cyst Q33.007 Other chronic osteomyelitis, left ankle and foot E11.65 Type 2 diabetes mellitus with hyperglycemia E11.621 Type 2 diabetes mellitus with foot ulcer Facility Procedures The patient participates with Medicare or their insurance follows the Medicare Facility Guidelines CPT4 Code Description Modifier Quantity [id number] G0277-(Facility Use Only) HBOT full body chamber, 47min , 4 ICD-10 Diagnosis Description [phone] Other chronic osteomyelitis, 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 Physician Procedures Quantity CPT4 Code Description Modifier [id number] 28768 - WC PHYS HYPERBARIC OXYGEN THERAPY 1 ICD-10 Diagnosis Description 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, 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

## 2023-01-13 ENCOUNTER — Encounter (HOSPITAL_BASED_OUTPATIENT_CLINIC_OR_DEPARTMENT_OTHER): Payer: Medicare Other | Attending: General Surgery | Admitting: General Surgery

## 2023-01-13 DIAGNOSIS — L97524 Non-pressure chronic ulcer of other part of left foot with necrosis of bone: Secondary | ICD-10-CM | POA: Insufficient documentation

## 2023-01-13 DIAGNOSIS — M86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, left ankle and foot: Secondary | ICD-10-CM | POA: Diagnosis not present

## 2023-01-13 DIAGNOSIS — G4730 Sleep apnea, unspecified: Secondary | ICD-10-CM | POA: Diagnosis not present

## 2023-01-13 DIAGNOSIS — E1169 Type 2 diabetes mellitus with other specified complication: Secondary | ICD-10-CM | POA: Insufficient documentation

## 2023-01-13 DIAGNOSIS — I10 Essential (primary) hypertension: Secondary | ICD-10-CM | POA: Insufficient documentation

## 2023-01-13 DIAGNOSIS — L723 Sebaceous cyst: Secondary | ICD-10-CM | POA: Insufficient documentation

## 2023-01-13 DIAGNOSIS — E11621 Type 2 diabetes mellitus with foot ulcer: Secondary | ICD-10-CM | POA: Insufficient documentation

## 2023-01-13 DIAGNOSIS — Z833 Family history of diabetes mellitus: Secondary | ICD-10-CM | POA: Diagnosis not present

## 2023-01-13 DIAGNOSIS — L98492 Non-pressure chronic ulcer of skin of other sites with fat layer exposed: Secondary | ICD-10-CM | POA: Diagnosis not present

## 2023-01-13 DIAGNOSIS — E1165 Type 2 diabetes mellitus with hyperglycemia: Secondary | ICD-10-CM | POA: Diagnosis not present

## 2023-01-13 DIAGNOSIS — E1140 Type 2 diabetes mellitus with diabetic neuropathy, unspecified: Secondary | ICD-10-CM | POA: Insufficient documentation

## 2023-01-13 DIAGNOSIS — L97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: Secondary | ICD-10-CM | POA: Insufficient documentation

## 2023-01-13 LAB — GLUCOSE, CAPILLARY
Glucose-Capillary: 139 mg/dL — ABNORMAL HIGH (ref 70–99)
Glucose-Capillary: 160 mg/dL — ABNORMAL HIGH (ref 70–99)
Glucose-Capillary: 192 mg/dL — ABNORMAL HIGH (ref 70–99)

## 2023-01-13 NOTE — Progress Notes (Signed)
ANDRAS, GRUNEWALD R ([id number]) [id number] Page 1 of 1 Visit Report for 01/13/2023 SuperBill Details Patient Name: Date of Service: [facility], [name] 01/13/2023 Medical Record Number: [id number] Patient Account Number: [id number] Date of Birth/Sex: Treating RN: [birth year] (67 y.o. 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.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 L98.492 Non-pressure chronic ulcer of skin of other sites with fat layer exposed L72.3 Sebaceous cyst Y85.027 Other chronic osteomyelitis, left ankle and foot E11.65 Type 2 diabetes mellitus with hyperglycemia E11.621 Type 2 diabetes mellitus with foot ulcer Facility Procedures The patient participates with Medicare or their insurance follows the Medicare Facility Guidelines CPT4 Code Description Modifier Quantity [id number] G0277-(Facility Use Only) HBOT full body chamber, 97min , 4 ICD-10 Diagnosis Description [phone] Other chronic osteomyelitis, 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 Physician Procedures Quantity CPT4 Code Description Modifier [id number] 36629 - WC PHYS HYPERBARIC OXYGEN THERAPY 1 ICD-10 Diagnosis Description 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, 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

## 2023-01-13 NOTE — Progress Notes (Signed)
SOHAN, POTVIN [name] ([id number]) [id number] Page 1 of 2 Visit Report for 01/13/2023 Arrival Information Details Patient Name: Date of Service: [name], [name] [name]. 01/13/2023 10:00 A M Medical Record Number: [id number] Patient Account Number: [id number] Date of Birth/Sex: Treating RN: [birth year] (67 y.o. [name] Primary Care [name]: [name] Other Clinician: Donavan [name] Referring [name]: Treating [name]/Extender: [name] in Treatment: 52 Visit Information History Since Last Visit All ordered tests and consults were completed: Yes Patient Arrived: Knee Scooter Added or deleted any medications: No Arrival Time: 09:53 Any new allergies or adverse reactions: No Accompanied By: self Had a fall or experienced change in No Transfer Assistance: None activities of daily living that may affect Patient Identification Verified: Yes risk of falls: Secondary Verification Process Completed: Yes Signs or symptoms of abuse/neglect since last visito No Patient Requires Transmission-Based Precautions: No Hospitalized since last visit: No Patient Has Alerts: No Implantable device outside of the clinic excluding No cellular tissue based products placed in the center since last visit: Pain Present Now: No Electronic Signature(s) Signed: 01/13/2023 1:14:03 PM By: [name] CHT EMT BS , , Entered By: [name] on 01/13/2023 13:14:03 -------------------------------------------------------------------------------- Encounter Discharge Information Details Patient Name: Date of Service: [name], [name] [name] 01/13/2023 10:00 A M Medical Record Number: [id number] Patient Account Number: [id number] Date of Birth/Sex: Treating RN: 1956/08/31 (67 y.o. [name] Primary Care Girtie Wiersma: [name] Other Clinician: Donavan [name] Referring Yarielis Funaro: Treating Alexus Galka/Extender: [name] in Treatment: 17 Encounter Discharge Information Items Discharge Condition: Stable Ambulatory Status: Knee Scooter Discharge Destination: Home Transportation: Private Auto Accompanied By: self Schedule Follow-up Appointment: No Clinical Summary of Care: Electronic Signature(s) Signed: 01/13/2023 1:38:25 PM By: [name] CHT EMT BS , , Entered By: [name] on 01/13/2023 13:38:25 [name] ([id number]) [phone].pdf Page 2 of 2 -------------------------------------------------------------------------------- Vitals Details Patient Name: Date of Service: [name], [name] [name]. 01/13/2023 10:00 A M Medical Record Number: [id number] Patient Account Number: [id number] Date of Birth/Sex: Treating RN: [birth year] (67 y.o. [name] Primary Care Kortland Nichols: [name] Other Clinician: Donavan [name] Referring Caysie Minnifield: Treating Paarth Cropper/Extender: [name] in Treatment: 17 Vital Signs Time Taken: 09:56 Temperature (F): 98.4 Height (in): 74 Pulse (bpm): 78 Weight (lbs): 245 Respiratory Rate (breaths/min): 20 Body Mass Index (BMI): 31.5 Blood Pressure (mmHg): 99/84 Capillary Blood Glucose (mg/dl): 139 Reference Range: 80 - 120 mg / dl Electronic Signature(s) Signed: 01/13/2023 1:15:59 PM By: [name] CHT EMT BS , , Entered By: [name] on 01/13/2023 13:15:59

## 2023-01-13 NOTE — Progress Notes (Addendum)
JAKIM, DRAPEAU R ([id number]) [id number] Page 1 of 2 Visit Report for 01/13/2023 HBO Details Patient Name: Date of Service: [name], [name] [name] 01/13/2023 10:00 A M Medical Record Number: [id number] Patient Account Number: [id number] Date of Birth/Sex: Treating RN: Dec 27, 1955 (67 y.o. 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 HBO Treatment Details Treatment Number: 40 Patient Type: Outpatient Chamber Type: Monoplace Chamber Serial #: [id number] Treatment Protocol: 2.0 ATA with 90 minutes oxygen, with two 5 minute air breaks Treatment Details Compression Rate Down: 1.5 psi / minute De-Compression Rate Up: 2.0 psi / minute A breaks and breathing ir Compress Tx Pressure periods Decompress Decompress Begins Reached (leave unused spaces Begins Ends blank) Chamber Pressure (ATA 1 2 2 2 2 2  --2 1 ) Clock Time (24 hr) 10:43 11:00 11:30 11:35 12:05 12:10 - - 12:40 12:52 Treatment Length: 129 (minutes) Treatment Segments: 4 Vital Signs Capillary Blood Glucose Reference Range: 80 - 120 mg / dl HBO Diabetic Blood Glucose Intervention Range: <131 mg/dl or >249 mg/dl Type: Time Vitals Blood Pulse: Respiratory Temperature: Capillary Blood Glucose Pulse Action Taken: Pressure: Rate: Glucose (mg/dl): Meter #: Oximetry (%) Taken: Pre 09:56 139 1 none per protocol, vital signs taken separately Post 13:01 146/74 65 16 97.5 192 1 none Pre 10:34 99/84 78 20 98.4 160 1 safety glucose check, take manual bp Pre 10:35 106/62 manual BP Treatment Response Treatment Toleration: Well Treatment Completion Status:  Treatment Completed without Adverse Event Treatment Notes Patient arrived, blood glucose level measured at 139 mg/dL. Patient stated he had two carnation breakfasts meals. Patient prepared for treatment and at bedside vital signs were taken and a safety glucose check based on past encounters. Blood glucose was 160 mg/dL. Safety check performed and patient was placed in the chamber. Chamber compressed at 1 psi/min until reaching 6 psig. With confirmation of proper ear equalization rate set was increased to 1.5 psi/min until reaching 2 ATA at which time rate set increased to 2 psi/min. Patient tolerated treatment well and chamber decompression at 2 psi/min. Post treatment glucose was 192 mg/dL. Patient was stable upon discharge. Physician HBO Attestation: I certify that I supervised this HBO treatment in accordance with Medicare guidelines. A trained emergency response team is readily available per Yes hospital policies and procedures. Continue HBOT as ordered. Yes Electronic Signature(s) Signed: 01/13/2023 4:04:35 PM By: Fredirick Maudlin MD FACS Previous Signature: 01/13/2023 4:04:10 PM Version By: Fredirick Maudlin MD FACS Previous Signature: 01/13/2023 1:32:23 PM Version By: Donavan Burnet CHT EMT BS , , Previous Signature: 01/13/2023 1:32:06 PM Version By: Donavan Burnet CHT EMT BS , , Entered By: Fredirick Maudlin on 01/13/2023 16:04:35 [name] Postin R ([id number]) [id number] Page 2 of 2 -------------------------------------------------------------------------------- HBO Safety Checklist Details Patient Name: Date of Service: [name], [name] [name] 01/13/2023 10:00 A M Medical Record Number: [id number] Patient Account Number: [id number] Date of Birth/Sex: Treating RN: March 02, 1956 (67 y.o. [name] Primary Care Yoni Lobos: Nelda Bucks Other Clinician: Donavan Burnet Referring Damita Eppard: Treating Syler Norcia/Extender: Doristine Bosworth in  Treatment: 17 HBO Safety Checklist Items Safety Checklist Consent Form Signed Patient voided / foley secured and emptied When did you last eato 0800 Last dose of injectable or oral agent 0815 Ostomy pouch emptied and  vented if applicable NA All implantable devices assessed, documented and approved NA Intravenous access site secured and place NA Valuables secured Linens and cotton and cotton/polyester blend (less than 51% polyester) Personal oil-based products / skin lotions / body lotions removed Wigs or hairpieces removed NA Smoking or tobacco materials removed NA Books / newspapers / magazines / loose paper removed Cologne, aftershave, perfume and deodorant removed Jewelry removed (may wrap wedding band) Make-up removed NA Hair care products removed Battery operated devices (external) removed Heating patches and chemical warmers removed Titanium eyewear removed NA Nail polish cured greater than 10 hours NA Casting material cured greater than 10 hours NA Hearing aids removed NA Loose dentures or partials removed dentures removed Prosthetics have been removed NA Patient demonstrates correct use of air break device (if applicable) Patient concerns have been addressed Patient grounding bracelet on and cord attached to chamber Specifics for Inpatients (complete in addition to above) Medication sheet sent with patient NA Intravenous medications needed or due during therapy sent with patient NA Drainage tubes (e.g. nasogastric tube or chest tube secured and vented) NA Endotracheal or Tracheotomy tube secured NA Cuff deflated of air and inflated with saline NA Airway suctioned NA Notes Paper version used prior to treatment. Electronic Signature(s) Signed: 01/13/2023 1:17:41 PM By: Donavan Burnet CHT EMT BS , , Entered By: Donavan Burnet on 01/13/2023 13:17:41

## 2023-01-14 ENCOUNTER — Encounter (HOSPITAL_BASED_OUTPATIENT_CLINIC_OR_DEPARTMENT_OTHER): Payer: Medicare Other | Admitting: General Surgery

## 2023-01-14 DIAGNOSIS — E11621 Type 2 diabetes mellitus with foot ulcer: Secondary | ICD-10-CM | POA: Diagnosis not present

## 2023-01-14 LAB — GLUCOSE, CAPILLARY: Glucose-Capillary: 135 mg/dL — ABNORMAL HIGH (ref 70–99)

## 2023-01-15 NOTE — Progress Notes (Signed)
[name] ([id number]) [id number] Page 1 of 2 Visit Report for 01/14/2023 HBO Details Patient Name: Date of Service: [name]. 01/14/2023 10:00 A M Medical Record Number: [id number] Patient Account Number: [id number] Date of Birth/Sex: Treating RN: 07/09/1956 (67 y.o. 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 HBO Treatment Details Treatment Number: 41 Patient Type: Outpatient Chamber Type: Monoplace Chamber Serial #: [id number] Treatment Protocol: 2.5 ATA with 90 minutes oxygen, with two 5 minute air breaks Treatment Details Compression Rate Down: 1.5 psi / minute De-Compression Rate Up: 1.5 psi / minute A breaks and breathing ir Compress Tx Pressure periods Decompress Decompress Begins Reached (leave unused spaces Begins Ends blank) Chamber Pressure (ATA 1 2.5 2.5 2.5 2.5 2.5 - - 2.5 1 ) Clock Time (24 hr) 13:14 13:29 13:59 14:04 14:34 14:39 - - 15:09 15:20 Treatment Length: 126 (minutes) Treatment Segments: 4 Vital Signs Capillary Blood Glucose Reference Range: 80 - 120 mg / dl HBO Diabetic Blood Glucose Intervention Range: <131 mg/dl or >249 mg/dl Time Vitals Blood Respiratory Capillary Blood Glucose Pulse Action Type: Pulse: Temperature: Taken: Pressure: Rate: Glucose (mg/dl): Meter #: Oximetry (%) Taken: Pre 12:28 138/78 89 20 98.2 135 Post 15:25 165/78 70 16 97.9 146 Treatment Response Treatment Toleration: Well Treatment Completion Status: Treatment Completed without Adverse Event Physician HBO Attestation: I certify that I supervised this HBO treatment in accordance with  Medicare guidelines. A trained emergency response team is readily available per Yes hospital policies and procedures. Continue HBOT as ordered. Yes Electronic Signature(s) Signed: 01/14/2023 4:38:19 PM By: Fredirick Maudlin MD FACS Entered By: Fredirick Maudlin on 01/14/2023 16:38:19 HBO Safety Checklist Details -------------------------------------------------------------------------------- [name] ([id number]) [id number] Page 2 of 2 Patient Name: Date of Service: [name]. 01/14/2023 10:00 A M Medical Record Number: [id number] Patient Account Number: [id number] Date of Birth/Sex: Treating RN: 10-Apr-1956 (67 y.o. [name], [name] Primary Care Aneudy Champlain: Nelda Bucks Other Clinician: Referring Fairy Ashlock: Treating Brianne Maina/Extender: Doristine Bosworth in Treatment: 18 HBO Safety Checklist Items Safety Checklist Consent Form Signed Patient voided / foley secured and emptied big meal last night; snack at [employer] and When did you last eato mac and cheese Last dose of injectable or oral agent 0300 insulin Ostomy pouch emptied and vented if applicable NA All implantable devices assessed, documented and approved NA Intravenous access site secured and place NA Valuables secured Linens and cotton and cotton/polyester blend (less than 51% polyester) Personal oil-based products / skin lotions / body lotions removed Wigs or hairpieces removed Smoking or tobacco materials removed Books / newspapers / magazines / loose paper removed Cologne, aftershave, perfume and deodorant removed Jewelry removed (may wrap wedding band) Make-up removed NA Hair care products removed Battery operated devices (external) removed Heating patches and chemical warmers removed Titanium eyewear removed Nail polish cured greater than 10 hours NA Casting material cured greater than 10 hours NA Hearing aids removed Loose dentures or partials  removed NA Prosthetics have been removed NA Patient demonstrates correct use of air break device (if applicable) Patient concerns have been addressed Patient grounding bracelet on and cord attached to chamber Specifics for Inpatients (complete in addition to above) Medication sheet sent with patient Intravenous medications needed or due  during therapy sent with patient Drainage tubes (e.g. nasogastric tube or chest tube secured and vented) Endotracheal or Tracheotomy tube secured Cuff deflated of air and inflated with saline Airway suctioned Electronic Signature(s) Signed: 01/14/2023 6:10:45 PM By: Deon Pilling RN, BSN Entered By: Deon Pilling on 01/14/2023 14:18:34

## 2023-01-15 NOTE — Progress Notes (Signed)
HERRICK, HARTOG R ([id number]) [id number] Page 1 of 2 Visit Report for 01/14/2023 Arrival Information Details Patient Name: Date of Service: [place], [name] 01/14/2023 10:00 A M Medical Record Number: [id number] Patient Account Number: [id number] Date of Birth/Sex: Treating RN: March 05, 1956 (67 y.o. [name], Meta.Reding Primary Care Delaney Perona: Nelda Bucks Other Clinician: Referring Yovan Leeman: Treating Larry Alcock/Extender: Doristine Bosworth in Treatment: 18 Visit Information History Since Last Visit Added or deleted any medications: No Patient Arrived: Knee Scooter Any new allergies or adverse reactions: No Arrival Time: 12:49 Had a fall or experienced change in No Accompanied By: self activities of daily living that may affect Transfer Assistance: None risk of falls: Patient Identification Verified: Yes Signs or symptoms of abuse/neglect since last visito No Secondary Verification Process Completed: Yes Hospitalized since last visit: No Patient Requires Transmission-Based Precautions: No Implantable device outside of the clinic excluding No Patient Has Alerts: No cellular tissue based products placed in the center since last visit: Pain Present Now: No Electronic Signature(s) Signed: 01/14/2023 6:10:45 PM By: Deon Pilling RN, BSN Entered By: Deon Pilling on 01/14/2023 13:57:47 -------------------------------------------------------------------------------- Encounter Discharge Information Details Patient Name: Date of Service: [name], [name] 01/14/2023 10:00 A M Medical Record Number: [id number] Patient Account Number: [id number] Date of Birth/Sex: Treating RN: [birth year] (67 y.o. [name], [name] Primary Care Saleha Kalp: Nelda Bucks Other Clinician: Referring October Peery: Treating Miko Markwood/Extender: Doristine Bosworth in Treatment: 18 Encounter Discharge Information Items Discharge Condition: Stable Ambulatory  Status: Knee Scooter Discharge Destination: Home Transportation: Private Auto Accompanied By: self Schedule Follow-up Appointment: Yes Clinical Summary of Care: Electronic Signature(s) Signed: 01/14/2023 6:10:45 PM By: Deon Pilling RN, BSN Entered By: Deon Pilling on 01/14/2023 15:53:15 [name] ([id number]) [id number] Page 2 of 2 -------------------------------------------------------------------------------- Vitals Details Patient Name: Date of Service: [place], [name] 01/14/2023 10:00 A M Medical Record Number: [id number] Patient Account Number: [id number] Date of Birth/Sex: Treating RN: September 10, 1956 (67 y.o. [name], [name] Primary Care Jaylie Neaves: Nelda Bucks Other Clinician: Referring Milano Rosevear: Treating Mykelle Cockerell/Extender: Doristine Bosworth in Treatment: 18 Vital Signs Time Taken: 12:28 Temperature (F): 98.2 Height (in): 74 Pulse (bpm): 89 Weight (lbs): 245 Respiratory Rate (breaths/min): 20 Body Mass Index (BMI): 31.5 Blood Pressure (mmHg): 138/78 Capillary Blood Glucose (mg/dl): 135 Reference Range: 80 - 120 mg / dl Notes 1228 Patient ate a snack. 1258-Rechecked Blood glucose 164. Jezebelle Ledwell in agreement for proceeding with HBO. Electronic Signature(s) Signed: 01/14/2023 6:10:45 PM By: Deon Pilling RN, BSN Entered By: Deon Pilling on 01/14/2023 14:00:17

## 2023-01-15 NOTE — Progress Notes (Signed)
MARVELOUS, BOUWENS R ([id number]) [id number] Page 1 of 1 Visit Report for 01/14/2023 SuperBill Details Patient Name: Date of Service: [name], [name] [name] 01/14/2023 Medical Record Number: [id number] Patient Account Number: [id number] Date of Birth/Sex: Treating RN: [birth year] (67 y.o. 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.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 L98.492 Non-pressure chronic ulcer of skin of other sites with fat layer exposed L72.3 Sebaceous cyst R10.211 Other chronic osteomyelitis, left ankle and foot E11.65 Type 2 diabetes mellitus with hyperglycemia E11.621 Type 2 diabetes mellitus with foot ulcer Facility Procedures The patient participates with Medicare or their insurance follows the Medicare Facility Guidelines CPT4 Code Description Modifier Quantity [id number] G0277-(Facility Use Only) HBOT full body chamber, 58min , 4 Physician Procedures Quantity CPT4 Code Description Modifier [id number] 14388 - WC PHYS HYPERBARIC OXYGEN THERAPY 1 ICD-10 Diagnosis Description 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, 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

## 2023-01-17 ENCOUNTER — Encounter (HOSPITAL_BASED_OUTPATIENT_CLINIC_OR_DEPARTMENT_OTHER): Payer: Medicare Other | Admitting: General Surgery

## 2023-01-17 DIAGNOSIS — E11621 Type 2 diabetes mellitus with foot ulcer: Secondary | ICD-10-CM | POA: Diagnosis not present

## 2023-01-17 LAB — GLUCOSE, CAPILLARY
Glucose-Capillary: 164 mg/dL — ABNORMAL HIGH (ref 70–99)
Glucose-Capillary: 146 mg/dL — ABNORMAL HIGH (ref 70–99)
Glucose-Capillary: 118 mg/dL — ABNORMAL HIGH (ref 70–99)
Glucose-Capillary: 119 mg/dL — ABNORMAL HIGH (ref 70–99)
Glucose-Capillary: 159 mg/dL — ABNORMAL HIGH (ref 70–99)
Glucose-Capillary: 157 mg/dL — ABNORMAL HIGH (ref 70–99)

## 2023-01-17 NOTE — Progress Notes (Signed)
[name], [name] ([id number]) [id number] Page 1 of 2 Visit Report for 01/17/2023 Problem List Details Patient Name: Date of Service: [name], [name]. 01/17/2023 10:00 A M Medical Record Number: [id number] Patient Account Number: [id number] Date of Birth/Sex: Treating RN: [birth year] (67 y.o. [name] Primary Care Provider: Nelda Bucks Other Clinician: Donavan Burnet Referring Provider: Treating Provider/Extender: Doristine Bosworth in Treatment: 18 Active Problems ICD-10 Encounter Code Description Active Date MDM Diagnosis [phone] Non-pressure chronic ulcer of left heel and midfoot with 09/10/2022 No Yes necrosis of bone L97.523 Non-pressure chronic ulcer of other part of left foot with 09/10/2022 No Yes necrosis of muscle L98.492 Non-pressure chronic ulcer of skin of other sites with fat 12/17/2022 No Yes layer exposed L72.3 Sebaceous cyst 12/17/2022 No Yes A12.878 Other chronic osteomyelitis, left ankle and foot 09/10/2022 No Yes E11.65 Type 2 diabetes mellitus with hyperglycemia 09/10/2022 No Yes E11.621 Type 2 diabetes mellitus with foot ulcer 09/10/2022 No Yes Inactive Problems Resolved Problems Electronic Signature(s) Signed: 01/17/2023 3:06:06 PM By: Valeria Batman EMT Signed: 01/17/2023 3:18:38 PM By: Fredirick Maudlin MD FACS Entered By: Valeria Batman on 01/17/2023 15:06:06 SuperBill Details -------------------------------------------------------------------------------- [name] ([id number]) [id number] Page 2 of 2 Patient Name: Date of Service: [name], [name] [name] 01/17/2023 Medical Record Number: [id number] Patient Account Number: [id number] Date of Birth/Sex: Treating RN: 09-10-1956 (67 y.o. 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.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 L98.492 Non-pressure chronic ulcer of skin of other sites with fat layer exposed L72.3 Sebaceous cyst Y50.354 Other chronic osteomyelitis, left ankle and foot E11.65 Type 2 diabetes mellitus with hyperglycemia E11.621 Type 2 diabetes mellitus with foot ulcer Facility Procedures The patient participates with Medicare or their insurance follows the Medicare Facility Guidelines CPT4 Code Description Modifier Quantity [id number] G0277-(Facility Use Only) HBOT full body chamber, 25min , 4 ICD-10 Diagnosis Description [phone] Other chronic osteomyelitis, 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 Physician Procedures Quantity CPT4 Code Description Modifier [id number] 59163 - WC PHYS HYPERBARIC OXYGEN THERAPY 1 ICD-10 Diagnosis Description 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, 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

## 2023-01-17 NOTE — Progress Notes (Addendum)
[name], [name] ([id number]) [id number] Page 1 of 2 Visit Report for 01/17/2023 Arrival Information Details Patient Name: Date of Service: [name], [name] 01/17/2023 10:00 A M Medical Record Number: [id number] Patient Account Number: [id number] Date of Birth/Sex: Treating RN: [birth year] (67 y.o. [name] Primary Care Nikkolas Coomes: Nelda Bucks Other Clinician: Donavan Burnet Referring Lilinoe Acklin: Treating Jaionna Weisse/Extender: Doristine Bosworth in Treatment: 18 Visit Information History Since Last Visit All ordered tests and consults were completed: Yes Patient Arrived: Knee Scooter Added or deleted any medications: No Arrival Time: 08:34 Any new allergies or adverse reactions: No Accompanied By: self Had a fall or experienced change in No Transfer Assistance: None activities of daily living that may affect Patient Identification Verified: Yes risk of falls: Secondary Verification Process Completed: Yes Signs or symptoms of abuse/neglect since last visito No Patient Requires Transmission-Based Precautions: No Hospitalized since last visit: No Patient Has Alerts: No Implantable device outside of the clinic excluding No cellular tissue based products placed in the center since last visit: Pain Present Now: No Electronic Signature(s) Signed: 01/17/2023 2:10:14 PM By: Donavan Burnet CHT EMT BS , , Entered By: Donavan Burnet on 01/17/2023 14:10:14 -------------------------------------------------------------------------------- Encounter Discharge Information Details Patient Name: Date of Service: [name], [name] 01/17/2023 10:00 A M Medical Record Number: [id number] Patient Account Number: [id number] Date of Birth/Sex: Treating RN: 1955-12-25 (67 y.o. [name] Primary Care Raziel Koenigs: Nelda Bucks Other Clinician: Valeria Batman Referring Hennessy Bartel: Treating Latarshia Jersey/Extender: Doristine Bosworth in Treatment: 18 Encounter Discharge Information Items Discharge Condition: Stable Ambulatory Status: Knee Scooter Discharge Destination: Home Transportation: Private Auto Accompanied By: None Schedule Follow-up Appointment: No Clinical Summary of Care: Electronic Signature(s) Signed: 01/17/2023 3:06:56 PM By: Valeria Batman EMT Entered By: Valeria Batman on 01/17/2023 15:06:56 [name] ([id number]) [id number] Page 2 of 2 -------------------------------------------------------------------------------- Vitals Details Patient Name: Date of Service: [name], [name] 01/17/2023 10:00 A M Medical Record Number: [id number] Patient Account Number: [id number] Date of Birth/Sex: Treating RN: July 21, 1956 (67 y.o. [name] Primary Care Georgine Wiltse: Nelda Bucks Other Clinician: Donavan Burnet Referring Breyton Vanscyoc: Treating Victorine Mcnee/Extender: Doristine Bosworth in Treatment: 18 Vital Signs Time Taken: 09:40 Temperature (F): 98.8 Height (in): 74 Pulse (bpm): 87 Weight (lbs): 245 Respiratory Rate (breaths/min): 018 Body Mass Index (BMI): 31.5 Blood Pressure (mmHg): 137/72 Capillary Blood Glucose (mg/dl): 119 Reference Range: 80 - 120 mg / dl Electronic Signature(s) Signed: 01/17/2023 2:15:34 PM By: Donavan Burnet CHT EMT BS , , Entered By: Donavan Burnet on 01/17/2023 14:15:34

## 2023-01-18 ENCOUNTER — Encounter (HOSPITAL_BASED_OUTPATIENT_CLINIC_OR_DEPARTMENT_OTHER): Payer: Medicare Other | Admitting: General Surgery

## 2023-01-18 DIAGNOSIS — E11621 Type 2 diabetes mellitus with foot ulcer: Secondary | ICD-10-CM | POA: Diagnosis not present

## 2023-01-18 LAB — GLUCOSE, CAPILLARY
Glucose-Capillary: 246 mg/dL — ABNORMAL HIGH (ref 70–99)
Glucose-Capillary: 244 mg/dL — ABNORMAL HIGH (ref 70–99)
Glucose-Capillary: 203 mg/dL — ABNORMAL HIGH (ref 70–99)

## 2023-01-18 NOTE — Progress Notes (Signed)
ROBERTSON, COLCLOUGH R ([id number]) [id number] Page 1 of 2 Visit Report for 01/18/2023 HBO Details Patient Name: Date of Service: [place], [name] 01/18/2023 10:00 A M Medical Record Number: [id number] Patient Account Number: [id number] Date of Birth/Sex: Treating RN: [birth year] (67 y.o. 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 HBO Treatment Details Treatment Number: 43 Patient Type: Outpatient Chamber Type: Monoplace Chamber Serial #: [id number] Treatment Protocol: 2.5 ATA with 90 minutes oxygen, with two 5 minute air breaks Treatment Details Compression Rate Down: 2.0 psi / minute De-Compression Rate Up: 2.0 psi / minute A breaks and breathing ir Compress Tx Pressure periods Decompress Decompress Begins Reached (leave unused spaces Begins Ends blank) Chamber Pressure (ATA 1 2.5 2.5 2.5 2.5 2.5 - - 2.5 1 ) Clock Time (24 hr) 10:53 11:07 10:37 11:42 12:12 12:17 - - 12:47 12:57 Treatment Length: 124 (minutes) Treatment Segments: 4 Vital Signs Capillary Blood Glucose Reference Range: 80 - 120 mg / dl HBO Diabetic Blood Glucose Intervention Range: <131 mg/dl or >249 mg/dl Time Vitals Blood Respiratory Capillary Blood Glucose Pulse Action Type: Pulse: Temperature: Taken: Pressure: Rate: Glucose (mg/dl): Meter #: Oximetry (%) Taken: Pre 10:20 138/67 95 18 97.8 246 Post 13:02 149/74 82 18 97.8 203 Pre 10:48 244 Treatment Response Treatment Toleration: Well Treatment Completion Status: Treatment Completed without Adverse Event Treatment Notes The patient stated that he last ate "LAST NIGHT  AROUND 2030". The patient used Afrin before the treatment started. Physician HBO Attestation: I certify that I supervised this HBO treatment in accordance with Medicare guidelines. A trained emergency response team is readily available per Yes hospital policies and procedures. Continue HBOT as ordered. Yes Electronic Signature(s) Signed: 01/18/2023 3:14:08 PM By: Fredirick Maudlin MD FACS Previous Signature: 01/18/2023 1:48:30 PM Version By: Valeria Batman EMT Entered By: Fredirick Maudlin on 01/18/2023 15:14:08 [name] ([id number]) [id number] Page 2 of 2 -------------------------------------------------------------------------------- HBO Safety Checklist Details Patient Name: Date of Service: [place], [name] 01/18/2023 10:00 A M Medical Record Number: [id number] Patient Account Number: [id number] Date of Birth/Sex: Treating RN: 1956-04-09 (68 y.o. [name], [name] Primary Care Titan Karner: Nelda Bucks Other Clinician: Valeria Batman Referring Lorri Fukuhara: Treating Rudy Luhmann/Extender: Doristine Bosworth in Treatment: 18 HBO Safety Checklist Items Safety Checklist Consent Form Signed Patient voided / foley secured and emptied When did you last eato yesterday 2030 Last dose of injectable or oral agent 0600 Ostomy pouch emptied and vented if applicable NA All implantable devices assessed, documented and approved NA Intravenous access site secured and place NA Valuables secured Linens and cotton and cotton/polyester blend (less than 51% polyester) Personal oil-based products / skin lotions / body lotions removed Wigs or hairpieces removed NA Smoking or tobacco materials removed Books / newspapers / magazines / loose paper removed Cologne, aftershave, perfume and deodorant removed Jewelry removed (may wrap wedding band) Make-up removed NA Hair care products removed Battery operated devices (external) removed Heating patches and  chemical warmers removed Titanium eyewear removed NA Nail polish cured greater than 10 hours NA Casting material cured greater than 10 hours NA Hearing aids removed NA Loose dentures or partials removed removed by patient Prosthetics have been removed NA Patient demonstrates correct use of air break device (if applicable)  Patient concerns have been addressed Patient grounding bracelet on and cord attached to chamber Specifics for Inpatients (complete in addition to above) Medication sheet sent with patient NA Intravenous medications needed or due during therapy sent with patient NA Drainage tubes (e.g. nasogastric tube or chest tube secured and vented) NA Endotracheal or Tracheotomy tube secured NA Cuff deflated of air and inflated with saline NA Airway suctioned NA Notes The safety checklist was done before the treatment was started. Electronic Signature(s) Signed: 01/18/2023 1:41:14 PM By: Valeria Batman EMT Entered By: Valeria Batman on 01/18/2023 13:41:14

## 2023-01-18 NOTE — Progress Notes (Signed)
[name], [name] ([id number]) [id number] Page 1 of 2 Visit Report for 01/18/2023 Problem List Details Patient Name: Date of Service: [name], [name] 01/18/2023 10:00 A M Medical Record Number: [id number] Patient Account Number: [id number] Date of Birth/Sex: Treating RN: November 29, 1956 (67 y.o. 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 L97.523 Non-pressure chronic ulcer of other part of left foot with 09/10/2022 No Yes necrosis of muscle L98.492 Non-pressure chronic ulcer of skin of other sites with fat 12/17/2022 No Yes layer exposed L72.3 Sebaceous cyst 12/17/2022 No Yes E42.353 Other chronic osteomyelitis, left ankle and foot 09/10/2022 No Yes E11.65 Type 2 diabetes mellitus with hyperglycemia 09/10/2022 No Yes E11.621 Type 2 diabetes mellitus with foot ulcer 09/10/2022 No Yes Inactive Problems Resolved Problems Electronic Signature(s) Signed: 01/18/2023 1:49:46 PM By: Valeria Batman EMT Signed: 01/18/2023 3:13:36 PM By: Fredirick Maudlin MD FACS Entered By: Valeria Batman on 01/18/2023 13:49:46 SuperBill Details -------------------------------------------------------------------------------- [name] ([id number]) [id number] Page 2 of 2 Patient Name: Date of Service: [place], [name] 01/18/2023 Medical Record Number: [id number] Patient Account Number: [id number] Date of Birth/Sex: Treating RN: October 02, 1956 (67 y.o. 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.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 L98.492 Non-pressure chronic ulcer of skin of other sites with fat layer exposed L72.3 Sebaceous cyst I45.809 Other chronic osteomyelitis, left ankle and foot E11.65 Type 2 diabetes mellitus with hyperglycemia E11.621 Type 2 diabetes mellitus with foot ulcer Facility Procedures The patient participates with Medicare or their insurance follows the Medicare Facility Guidelines CPT4 Code Description Modifier Quantity [id number]-(Facility Use Only) HBOT full body chamber, 62min , 4 ICD-10 Diagnosis Description [phone] Other chronic osteomyelitis, 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 Physician Procedures Quantity CPT4 Code Description Modifier [id number] - WC PHYS HYPERBARIC OXYGEN THERAPY 1 ICD-10 Diagnosis Description 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, 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

## 2023-01-18 NOTE — Progress Notes (Signed)
[name], [name] ([id number]) [id number] Page 1 of 2 Visit Report for 01/18/2023 Arrival Information Details Patient Name: Date of Service: [name], [name] 01/18/2023 10:00 A M Medical Record Number: [id number] Patient Account Number: [id number] Date of Birth/Sex: Treating RN: 1956-06-19 (67 y.o. [name], [name] Primary Care Amry Cathy: Nelda Bucks Other Clinician: Valeria Batman Referring Blaine Guiffre: Treating Marquavious Nazar/Extender: Doristine Bosworth in Treatment: 18 Visit Information History Since Last Visit All ordered tests and consults were completed: Yes Patient Arrived: Knee Scooter Added or deleted any medications: No Arrival Time: 10:14 Any new allergies or adverse reactions: No Accompanied By: None Had a fall or experienced change in No Transfer Assistance: None activities of daily living that may affect Patient Identification Verified: Yes risk of falls: Secondary Verification Process Completed: Yes Signs or symptoms of abuse/neglect since last visito No Patient Requires Transmission-Based Precautions: No Hospitalized since last visit: No Patient Has Alerts: No Implantable device outside of the clinic excluding No cellular tissue based products placed in the center since last visit: Pain Present Now: No Electronic Signature(s) Signed: 01/18/2023 1:39:31 PM By: Valeria Batman EMT Entered By: Valeria Batman on 01/18/2023 13:39:31 -------------------------------------------------------------------------------- Encounter Discharge Information Details Patient Name: Date of Service: [name], [name] 01/18/2023 10:00 A M Medical Record Number: [id number] Patient Account Number: [id number] Date of Birth/Sex: Treating RN: [birth year] (67 y.o. [name] Primary Care Gloriajean Okun: Nelda Bucks Other Clinician: Valeria Batman Referring Alayiah Fontes: Treating Deliah Strehlow/Extender: Doristine Bosworth in  Treatment: 18 Encounter Discharge Information Items Discharge Condition: Stable Ambulatory Status: Knee Scooter Discharge Destination: Home Transportation: Other Accompanied By: None Schedule Follow-up Appointment: Yes Clinical Summary of Care: Electronic Signature(s) Signed: 01/18/2023 1:50:25 PM By: Valeria Batman EMT Entered By: Valeria Batman on 01/18/2023 13:50:25 [name] ([id number]) [id number] Page 2 of 2 -------------------------------------------------------------------------------- Vitals Details Patient Name: Date of Service: [name], [name] 01/18/2023 10:00 A M Medical Record Number: [id number] Patient Account Number: [id number] Date of Birth/Sex: Treating RN: 1956-09-08 (67 y.o. [name] Primary Care Jimena Wieczorek: Nelda Bucks Other Clinician: Valeria Batman Referring Jamell Laymon: Treating Cannen Dupras/Extender: Doristine Bosworth in Treatment: 18 Vital Signs Time Taken: 10:20 Temperature (F): 97.8 Height (in): 74 Pulse (bpm): 95 Weight (lbs): 245 Respiratory Rate (breaths/min): 18 Body Mass Index (BMI): 31.5 Blood Pressure (mmHg): 138/67 Capillary Blood Glucose (mg/dl): 246 Reference Range: 80 - 120 mg / dl Electronic Signature(s) Signed: 01/18/2023 1:40:08 PM By: Valeria Batman EMT Entered By: Valeria Batman on 01/18/2023 13:40:08

## 2023-01-18 NOTE — Progress Notes (Addendum)
[name], [name] ([id number]) [id number] Page 1 of 2 Visit Report for 01/17/2023 HBO Details Patient Name: Date of Service: [place], [place] [name]. 01/17/2023 10:00 A M Medical Record Number: [id number] Patient Account Number: [id number] Date of Birth/Sex: Treating RN: 1956-06-20 (67 y.o. 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 HBO Treatment Details Treatment Number: 42 Patient Type: Outpatient Chamber Type: Monoplace Chamber Serial #: [id number] Treatment Protocol: 2.5 ATA with 90 minutes oxygen, with two 5 minute air breaks Treatment Details Compression Rate Down: 2.0 psi / minute De-Compression Rate Up: 2.0 psi / minute A breaks and breathing ir Compress Tx Pressure periods Decompress Decompress Begins Reached (leave unused spaces Begins Ends blank) Chamber Pressure (ATA 1 2.5 2.5 2.5 2.5 2.5 - - 2.5 1 ) Clock Time (24 hr) 10:53 11:04 11:34 11:39 12:11 12:14 - - 12:44 12:55 Treatment Length: 122 (minutes) Treatment Segments: 4 Vital Signs Capillary Blood Glucose Reference Range: 80 - 120 mg / dl HBO Diabetic Blood Glucose Intervention Range: <131 mg/dl or >249 mg/dl Time Vitals Blood Respiratory Capillary Blood Glucose Pulse Action Type: Pulse: Temperature: Taken: Pressure: Rate: Glucose (mg/dl): Meter #: Oximetry (%) Taken: Pre 09:40 137/72 87 018 98.8 119 Post 12:59 167/82 81 18 97.6 157 Treatment Response Treatment Toleration: Well Treatment Completion Status: Treatment Completed without Adverse Event Treatment Notes Patient arrived, glucose level was measured at 119 mg/dL at  0940. Patient states that he ate last night and this morning around 0600 his blood glucose was 230 mg/dL. Patient self administered 6 units "fast acting" insulin and 60 units "slow acting" insulin at approximately 0615 and 0618 respectively. Patient's blood glucose was 118 at 1014 mg/dL. Physician informed and orders given to give patient orange juice and peanut butter crackers (1025). Blood glucose was re- measured at 1045 with a result of 159 mg/dL. The patient used Afrin before his treatment today. [name] Physician HBO Attestation: I certify that I supervised this HBO treatment in accordance with Medicare guidelines. A trained emergency response team is readily available per Yes hospital policies and procedures. Continue HBOT as ordered. Yes Electronic Signature(s) Signed: 01/18/2023 8:26:26 AM By: [name] Maudlin MD FACS Previous Signature: 01/17/2023 4:29:03 PM Version By: [name] Maudlin MD FACS Previous Signature: 01/17/2023 3:03:29 PM Version By: [name] EMT Previous Signature: 01/17/2023 2:34:23 PM Version By: [name] CHT EMT BS , , Entered By: [name] on 01/18/2023 08:26:26 [name], [name] ([id number]) [id number] Page 2 of 2 -------------------------------------------------------------------------------- HBO Safety Checklist Details Patient Name: Date of Service: [place], [place] [name]. 01/17/2023 10:00 A M Medical Record Number: [id number] Patient Account Number: [id number] Date of Birth/Sex: Treating RN: April 05, 1956 (67 y.o. [name] Primary Care [name]: [name] Other Clinician: Valeria [name] Referring [name]: Treating [name]/Extender: [name] in Treatment: 18 HBO Safety Checklist Items Safety Checklist Consent Form Signed Patient voided / foley secured and emptied When did you last eato last night Last dose of injectable or oral agent 0615 Ostomy pouch emptied and vented  if applicable NA All implantable devices assessed, documented and approved NA Intravenous access site secured and place NA Valuables secured Linens and cotton and cotton/polyester blend (less than 51% polyester) Personal oil-based products / skin lotions / body  lotions removed Wigs or hairpieces removed NA Smoking or tobacco materials removed NA Books / newspapers / magazines / loose paper removed Cologne, aftershave, perfume and deodorant removed Jewelry removed (may wrap wedding band) Make-up removed NA Hair care products removed Battery operated devices (external) removed Heating patches and chemical warmers removed Titanium eyewear removed Nail polish cured greater than 10 hours NA Casting material cured greater than 10 hours NA Hearing aids removed NA Loose dentures or partials removed dentures removed Prosthetics have been removed NA Patient demonstrates correct use of air break device (if applicable) Patient concerns have been addressed Patient grounding bracelet on and cord attached to chamber Specifics for Inpatients (complete in addition to above) Medication sheet sent with patient Intravenous medications needed or due during therapy sent with patient Drainage tubes (e.g. nasogastric tube or chest tube secured and vented) Endotracheal or Tracheotomy tube secured Cuff deflated of air and inflated with saline Airway suctioned Notes Paper version used prior to treatment. Electronic Signature(s) Signed: 01/17/2023 2:17:17 PM By: [name] CHT EMT BS , , Entered By: [name] on 01/17/2023 14:17:17

## 2023-01-19 ENCOUNTER — Encounter (HOSPITAL_BASED_OUTPATIENT_CLINIC_OR_DEPARTMENT_OTHER): Payer: Medicare Other | Admitting: General Surgery

## 2023-01-19 DIAGNOSIS — E11621 Type 2 diabetes mellitus with foot ulcer: Secondary | ICD-10-CM | POA: Diagnosis not present

## 2023-01-19 LAB — GLUCOSE, CAPILLARY
Glucose-Capillary: 174 mg/dL — ABNORMAL HIGH (ref 70–99)
Glucose-Capillary: 162 mg/dL — ABNORMAL HIGH (ref 70–99)

## 2023-01-19 NOTE — Progress Notes (Signed)
GWYNN, CROSSLEY R ([id number]) [id number] Page 1 of 2 Visit Report for 01/19/2023 Arrival Information Details Patient Name: Date of Service: [place], [name] 01/19/2023 10:00 A M Medical Record Number: [id number] Patient Account Number: [id number] Date of Birth/Sex: Treating RN: 04-29-1956 (67 y.o. Jimmey Ralph, Jamie Primary Care Bandon Sherwin: Nelda Bucks Other Clinician: Donavan Burnet Referring Geniece Akers: Treating Jerick Khachatryan/Extender: Doristine Bosworth in Treatment: 18 Visit Information History Since Last Visit All ordered tests and consults were completed: Yes Patient Arrived: Knee Scooter Added or deleted any medications: No Arrival Time: 10:03 Any new allergies or adverse reactions: No Accompanied By: self Had a fall or experienced change in No Transfer Assistance: None activities of daily living that may affect Patient Identification Verified: Yes risk of falls: Secondary Verification Process Completed: Yes Signs or symptoms of abuse/neglect since last visito No Patient Requires Transmission-Based Precautions: No Hospitalized since last visit: No Patient Has Alerts: No Implantable device outside of the clinic excluding No cellular tissue based products placed in the center since last visit: Pain Present Now: No Electronic Signature(s) Signed: 01/19/2023 1:28:57 PM By: Donavan Burnet CHT EMT BS , , Entered By: Donavan Burnet on 01/19/2023 13:28:57 -------------------------------------------------------------------------------- Encounter Discharge Information Details Patient Name: Date of Service: [place], [name] 01/19/2023 10:00 A M Medical Record Number: [id number] Patient Account Number: [id number] Date of Birth/Sex: Treating RN: [birth year] (67 y.o. [name] Primary Care Della Homan: Nelda Bucks Other Clinician: Donavan Burnet Referring Kaelene Elliston: Treating Sameerah Nachtigal/Extender: Doristine Bosworth in Treatment: 18 Encounter Discharge Information Items Discharge Condition: Stable Ambulatory Status: Knee Scooter Discharge Destination: Home Transportation: Private Auto Accompanied By: self Schedule Follow-up Appointment: No Clinical Summary of Care: Electronic Signature(s) Signed: 01/19/2023 1:42:24 PM By: Donavan Burnet CHT EMT BS , , Entered By: Donavan Burnet on 01/19/2023 13:42:24 [name] ([id number]) [id number] Page 2 of 2 -------------------------------------------------------------------------------- Vitals Details Patient Name: Date of Service: [place], [name] 01/19/2023 10:00 A M Medical Record Number: [id number] Patient Account Number: [id number] Date of Birth/Sex: Treating RN: 1956-02-10 (67 y.o. [name] Primary Care Kees Idrovo: Nelda Bucks Other Clinician: Donavan Burnet Referring Tamani Durney: Treating Caylah Plouff/Extender: Doristine Bosworth in Treatment: 18 Vital Signs Time Taken: 10:17 Temperature (F): 98.6 Height (in): 74 Pulse (bpm): 78 Weight (lbs): 245 Respiratory Rate (breaths/min): 18 Body Mass Index (BMI): 31.5 Blood Pressure (mmHg): 144/68 Capillary Blood Glucose (mg/dl): 174 Reference Range: 80 - 120 mg / dl Electronic Signature(s) Signed: 01/19/2023 1:29:42 PM By: Donavan Burnet CHT EMT BS , , Entered By: Donavan Burnet on 01/19/2023 13:29:42

## 2023-01-19 NOTE — Progress Notes (Addendum)
[name], [name] ([id number]) [id number] Page 1 of 13 Visit Report for 01/17/2023 Chief Complaint Document Details Patient Name: Date of Service: Tye, [name] 01/17/2023 8:30 A M Medical Record Number: [id number] Patient Account Number: [id number] Date of Birth/Sex: Treating RN: 01-26-1956 (67 y.o. M) Primary Care Provider: Nelda Bucks Other Clinician: Referring Provider: Treating Provider/Extender: Doristine Bosworth in Treatment: 18 Information Obtained from: Patient Chief Complaint Patients presents for treatment of an open diabetic ulcer and evaluation for hyperbaric oxygen therapy Electronic Signature(s) Signed: 01/17/2023 9:10:29 AM By: Fredirick Maudlin MD FACS Entered By: Fredirick Maudlin on 01/17/2023 09:10:28 -------------------------------------------------------------------------------- Debridement Details Patient Name: Date of Service: [name], [name] 01/17/2023 8:30 A M Medical Record Number: [id number] Patient Account Number: [id number] Date of Birth/Sex: Treating RN: April 04, 1956 (67 y.o. [name] Primary Care Provider: Nelda Bucks Other Clinician: Referring Provider: Treating Provider/Extender: Doristine Bosworth in Treatment: 18 Debridement Performed for Assessment: Wound #2 Left,Dorsal Foot Performed By: Physician Fredirick Maudlin, MD Debridement Type: Debridement Severity of Tissue Pre Debridement: Fat layer exposed Level of Consciousness (Pre-procedure): Awake and Alert Pre-procedure Verification/Time Out Yes - 08:58 Taken: Start Time: 08:59 T Area Debrided (L x W): otal 2 (cm) x 2.6 (cm) = 5.2 (cm) Tissue and other material debrided: Non-Viable, Slough, Slough Level: Non-Viable Tissue Debridement Description: Selective/Open Wound Instrument: Curette Bleeding: Minimum Hemostasis Achieved: Pressure Response to Treatment: Procedure was tolerated well Level of  Consciousness (Post- Awake and Alert procedure): Post Debridement Measurements of Total Wound Length: (cm) 2 Width: (cm) 2.6 Depth: (cm) 0.1 Volume: (cm) 0.408 Character of Wound/Ulcer Post Debridement: Requires Further Debridement Severity of Tissue Post Debridement: Fat layer exposed Post Procedure Diagnosis Same as Pre-procedure Notes Scribed for Dr. Celine Ahr by Blanche East, RN Electronic Signature(s) Signed: 01/17/2023 9:34:58 AM By: Fredirick Maudlin MD FACS Signed: 01/19/2023 7:57:59 AM By: Blanche East RN Entered By: Blanche East on 01/17/2023 08:59:58 [name] ([id number]) [id number] Page 2 of 13 -------------------------------------------------------------------------------- Debridement Details Patient Name: Date of Service: [name], [name] 01/17/2023 8:30 A M Medical Record Number: [id number] Patient Account Number: [id number] Date of Birth/Sex: Treating RN: February 07, 1956 (67 y.o. [name] Primary Care Provider: Nelda Bucks Other Clinician: Referring Provider: Treating Provider/Extender: Doristine Bosworth in Treatment: 18 Debridement Performed for Assessment: Wound #1 Left Calcaneus Performed By: Physician Fredirick Maudlin, MD Debridement Type: Debridement Severity of Tissue Pre Debridement: Fat layer exposed Level of Consciousness (Pre-procedure): Awake and Alert Pre-procedure Verification/Time Out Yes - 08:58 Taken: Start Time: 08:59 T Area Debrided (L x W): otal 6 (cm) x 5 (cm) = 30 (cm) Tissue and other material debrided: Non-Viable, Callus, Slough, Skin: Epidermis, Slough Level: Skin/Epidermis Debridement Description: Selective/Open Wound Instrument: Curette Bleeding: Minimum Hemostasis Achieved: Pressure Response to Treatment: Procedure was tolerated well Level of Consciousness (Post- Awake and Alert procedure): Post Debridement Measurements of Total Wound Length: (cm) 6 Width: (cm)  5 Depth: (cm) 0.3 Volume: (cm) 7.069 Character of Wound/Ulcer Post Debridement: Requires Further Debridement Severity of Tissue Post Debridement: Fat layer exposed Post Procedure Diagnosis Same as Pre-procedure Notes Scribed for Dr. Celine Ahr by Blanche East, RN Electronic Signature(s) Signed: 01/17/2023 9:34:58 AM By: Fredirick Maudlin MD FACS Signed: 01/19/2023 7:57:59 AM By: Blanche East RN Entered By: Blanche East on 01/17/2023 09:19:15 -------------------------------------------------------------------------------- HPI Details Patient Name: Date of Service: [name], [name] 01/17/2023 8:30 A M Medical Record Number: [id number] Patient Account Number: [id number] Date of Birth/Sex: Treating RN: Jul 20, 1956 (66 y.o.  M) Primary Care Provider: Nelda Bucks Other Clinician: Referring Provider: Treating Provider/Extender: Doristine Bosworth in Treatment: 18 History of Present Illness HPI Description: ADMISSION 09/10/2022 This is a 67 year old poorly controlled type II diabetic (last hemoglobin A1c 10.8%) who has had an ulcer on his heel for over 3 years. He has been seen in multiple wound care centers, including [facility] and [facility]. He reports that at least 3 doctors have recommended that he undergo below-knee amputation. He most recently met with Dr. Catalina Gravel, a vascular surgeon affiliated with [facility]. 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 Unfortunately, the patient has some extenuating social circumstances including the fact that he cares for his wife who has stage IV colon cancer and still works, driving vehicles for [employer]. 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 He was recently hospitalized at [facility] for sepsis and currently has a PICC line through which he receives IV antibiotics. He reports having had another MRI  during that hospital stay along with a chest x-ray and EKG. He apparently contacted one of the hyperbaric therapy techs here and asked a number of questions about hyperbaric oxygen treatments. He subsequently self-referred to our center to undergo further evaluation and management. I mention to him that [facility] actually has hyperbaric chambers, but he states that he lives in [place] and this would be more convenient for him given the intensive nature of the therapy and time requirement. ABI in clinic today was 0.94. JD, MCMAKIN [name] ([id number]) [id number] Page 3 of 13 The patient actually has 2 wounds. There is a wound on the dorsum of his left foot with heavy black eschar and slough present. After debridement, this was demonstrated to involve the muscle and the extensor tendons are exposed. On his heel, there is essentially a "shark bite" type wound, with much of the heel fat pad absent. The muscle layer is exposed. There is blue-green staining around the perimeter of the wound, but no significant odor. He says he has been applying collagen to the wound on his heel and Silvadene and Betadine to the wound on his dorsal foot. 09/20/2022: The heel wound is quite macerated with wet periwound callus. There is slough accumulation on the surface. The dorsal foot wound looks better this week. There is still exposed tendon, but it is fairly clean with just a little biofilm buildup. We are still working on gathering the required documentation to submit for pretreatment review for hyperbaric oxygen therapy. 09/29/2022: The dorsal foot wound continues to improve. I do not see any exposed tendon at this point. There is just some slough accumulation on the wound surface. He continues to have very wet macerated periwound callus on his heel. There is slough on the surface, but it is loose and thin. There is an area of undermining at the 11 o'clock position, but the overlying skin and  subcutaneous tissue is healthy and viable. He has been approved for hyperbaric oxygen therapy and will start treatment tomorrow. 10/06/2022: Continued contraction and improvement of the dorsal foot wound. There is just a little bit of slough accumulation on the wound surface. The periwound callus continues to accumulate on the heel wound and it is quite macerated. It is also persistently found with blue-green staining present. He did initiate his hyperbaric oxygen therapy, but has had significant difficulty with decompression. He will be going to ENT to have PE  tubes placed. 10/18/2022: His hyperbaric oxygen therapy is on hold while his otological issues are being addressed. He came in again today with the periwound skin on both the dorsal aspect of his foot and his calcaneus completely macerated. The blue-green discoloration, however, has abated and the undermining on the calcaneus has improved. The dorsal foot wound has also contracted somewhat. 10/26/2022: The dorsal foot wound continues to contract and fill with good granulation tissue. The heel, once again, has a rim of macerated callus, but the undermining and tunneling continues to contract. He has completed his oral antibiotics and has been using the [facility] topical compounded antibiotic for his dressing changes at home. He is scheduled to see ENT this afternoon. 11/08/2022: The dorsal foot wound is flush with the surrounding skin and has a good granulation tissue surface. There is some slough accumulation. As usual, the heel has a rim of macerated callus but the dimensions are smaller and the tunneling and undermining have contracted further. He has resumed his hyperbaric oxygen therapy. 12/12; patient seen for wound evaluation. He is tolerating HBO although we could not dive him yesterday because of relative hypoglycemia and the fact he had given himself NovoLog insulin before he came to clinic. Today his blood sugar is in the 180 range she  should be fine. He has a large wound on the plantar calcaneus on the right and a more superficial area on the dorsal foot. He is using a scooter for offloading 11/30/2022: The dorsal foot wound continues to contract. There is some slough on the wound surface. The large calcaneus wound has heaped up wet callus around the margins. Continued undermining. 12/08/2022: The dorsal foot wound is flat and flush with the surrounding skin surface. There is some slough present. Once again, the calcaneal wound has thick, absolutely macerated callus hanging off in tatters around the edges. The patient cannot explain to me why this part of his wound gets so wet. There is slough on the surface. The undermined portion of the wound has filled in. 12/17/2022: Earlier this week, he presented for his hyperbaric oxygen therapy and was hypotensive and ill-appearing. He also had what appeared to be an infected sebaceous cyst on his right upper arm. He was sent to the emergency department. He was given a fluid bolus and the ED provider lanced the cyst. He was prescribed doxycycline. He seems to be feeling better today. He notes that he has had copious drainage from his foot. On further questioning, he states that he has not been taking his oral diuretic. The dorsal foot wound has expanded somewhat, but is actually more superficial. The plantar heel wound is about the same. 12/27/2022: The dorsal foot wound has epithelialized further. The plantar foot wound is about the same size but has heaped up macerated callus around the perimeter. The intake nurse noted a tiny fragment of bone on his dressings and there is now an area at about the 5 o'clock position where 1 can palpate bone through a small slit in the soft tissues. The wound on his arm is contracting but he still likely has cyst wall present due to the manner in which the infected sebaceous cyst was addressed in the ER. 01/01/2023: The wound on his shoulder has contracted  considerably. The dorsal foot wound has some slough on the surface but also looks to be improving. The plantar foot wound is basically unchanged. 01/10/2023: The wound on his shoulder continues to diminish in size and depth. The dorsal foot wound is  improving with just a light layer of slough on the surface. The plantar foot wound appears to be contracting but still has thick wet callus around the perimeter. We did in chamber T cPO2 monitoring during his last hyperbaric oxygen therapy. The results show that he has extremely poor tissue perfusion at room air and 1 atm of pressure. He does respond extremely well to hyperbaric oxygen therapy, however. 01/17/2023: The wound on his shoulder is down to 0.6 cm in depth, down from 1 cm last week. The dorsal foot wound is quite a bit smaller this week with just some light slough on the surface. The plantar foot wound is also smaller and the area of bone that was exposed at the posterior heel is now covered. There is still substantial wet callus around the perimeter. He is scheduled to undergo formal vascular studies sometime next week. Electronic Signature(s) Signed: 01/17/2023 9:11:22 AM By: Fredirick Maudlin MD FACS Entered By: Fredirick Maudlin on 01/17/2023 09:11:22 -------------------------------------------------------------------------------- Physical Exam Details Patient Name: Date of Service: [name], [name] 01/17/2023 8:30 A M Medical Record Number: [id number] Patient Account Number: [id number] Date of Birth/Sex: Treating RN: 01-26-1956 (67 y.o. M) Primary Care Provider: Nelda Bucks Other Clinician: Referring Provider: Treating Provider/Extender: Doristine Bosworth in Treatment: 18 Constitutional Hypertensive, asymptomatic. . . . no acute distress. Respiratory HILLIARD, BALOUGH [name] ([id number]) [id number] Page 4 of 13 Normal work of breathing on room air. Notes 01/17/2023: The wound on his shoulder  is down to 0.6 cm in depth, down from 1 cm last week. The dorsal foot wound is quite a bit smaller this week with just some light slough on the surface. The plantar foot wound is also smaller and the area of bone that was exposed at the posterior heel is now covered. There is still substantial wet callus around the perimeter. Electronic Signature(s) Signed: 01/17/2023 9:11:51 AM By: Fredirick Maudlin MD FACS Entered By: Fredirick Maudlin on 01/17/2023 09:11:50 -------------------------------------------------------------------------------- Physician Orders Details Patient Name: Date of Service: [name], [name] 01/17/2023 8:30 A M Medical Record Number: [id number] Patient Account Number: [id number] Date of Birth/Sex: Treating RN: [birth year] (67 y.o. 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 - Dr. Celine Ahr rm 4 Return A Monday 01/24/23 at 8:30am [employer] Other [employer] Orders/Instructions: - run insurance for Apligraf Bathing/ Shower/ Hygiene May shower with protection but do not get wound dressing(s) wet. Protect dressing(s) with water repellant cover (for example, large plastic bag) or a cast cover and may then take shower. Negative Presssure Wound Therapy Wound Vac  to wound continuously at 159m/hg pressure - to left calcaneus Black Foam Edema Control - Lymphedema / SCD / Other Left Lower Extremity Avoid standing for long periods of time. Off-Loading Wound #1 Left Calcaneus Other: - use knee scooter to ambulate, minimal weight bearing right foot Home Health Dressing changes to be completed by Home  Health on Monday / Wednesday / Friday except when patient has scheduled visit at [facility]. Other Home Health Orders/Instructions: - [facility] Hyperbaric Oxygen Therapy Wound #1 Left Calcaneus Evaluate for HBO Therapy - Continue HBOT for additional 20 treatments for total of 60 treatments Indication: - chronic refractory osteomyelitis If appropriate for treatment, begin HBOT per protocol: 2.5 ATA for 90 Minutes with 2 Five (5) Minute A Breaks ir Total Number of Treatments: - 60 One treatments per day (delivered Monday through Friday unless otherwise specified in Special Instructions below): Finger stick Blood Glucose Pre- and Post- HBOT Treatment. Follow Hyperbaric Oxygen Glycemia Protocol A frin (Oxymetazoline HCL) 0.05% nasal spray - 1 spray in both nostrils daily as needed prior to HBO treatment for difficulty clearing ears JIMMEL, STUPAK ([id number]) [phone].pdf Page 5 of 13 Wound Treatment Wound #1 - Calcaneus Wound Laterality: Left Cleanser: Normal Saline 3 x Per Week/30 Days Discharge Instructions: Cleanse the wound with Normal Saline prior to applying a clean dressing using gauze sponges, not tissue or cotton balls. Cleanser: Soap and Water 3 x Per Week/30 Days Discharge Instructions: May shower and wash wound with dial antibacterial soap and water prior to dressing change. Peri-Wound Care: keystone 3 x Per Week/30 Days Prim Dressing: VAC ary 3 x Per Week/30 Days Secured With: Coban Self-Adherent Wrap 4x5 (in/yd) 3 x Per Week/30 Days Discharge Instructions: Secure with Coban as directed. Secured With:  [employer], 4.5x3.1 (in/yd) 3 x Per Week/30 Days Discharge Instructions: Secure with Kerlix as directed. Wound #2 - Foot Wound Laterality: Dorsal, Left Cleanser: Normal Saline Discharge Instructions: Cleanse the wound with Normal Saline prior to applying a clean dressing using gauze sponges, not tissue or cotton balls. Cleanser: Soap and Water Discharge Instructions: May shower and wash wound with dial antibacterial soap and water prior to dressing change. Peri-Wound Care: keystone Prim Dressing: Promogran Prisma Matrix, 4.34 (sq in) (silver collagen) ary Discharge Instructions: Moisten collagen with hydrogel Secondary Dressing: Zetuvit Plus Silicone Border Dressing 4x4 (in/in) Discharge Instructions: Apply silicone border over primary dressing as directed. Secured With: Coban Self-Adherent Wrap 4x5 (in/yd) Discharge Instructions: Secure with Coban as directed. Secured With: [employer], 4.5x3.1 (in/yd) Discharge Instructions: Secure with Kerlix as directed. Wound #3 - Shoulder Wound Laterality: Right Cleanser: Soap and Water 1 x Per Day/30 Days Discharge Instructions: May shower and wash wound with dial antibacterial soap and water prior to dressing change. Cleanser: Wound Cleanser 1 x Per Day/30 Days Discharge Instructions: Cleanse the wound with wound cleanser prior to applying a clean dressing using gauze sponges, not tissue or cotton balls. Peri-Wound Care: Skin Prep 1 x Per Day/30 Days Discharge Instructions: Use skin prep as directed Prim Dressing: Iodoform packing strip 1/4 (in) 1 x Per Day/30 Days ary Discharge Instructions: Lightly pack as instructed Secondary Dressing: Zetuvit Plus Silicone Border Dressing 4x4 (in/in) 1 x Per Day/30 Days Discharge Instructions: Apply silicone border over primary dressing as directed. GLYCEMIA INTERVENTIONS PROTOCOL PRE-HBO GLYCEMIA INTERVENTIONS ACTION INTERVENTION Obtain pre-HBO capillary blood glucose  (ensure 1 physician order is in chart). A. Notify HBO physician and await physician orders. 2 If result is 70 mg/dl or below: B. If the result meets the hospital definition of a critical result, follow hospital policy. A. Give patient an 8 ounce Glucerna Shake, an 8 ounce Ensure, or 8 ounces of a Glucerna/Ensure equivalent dietary supplement*. B. Wait 30 minutes. If result is 71 mg/dl to 130 mg/dl: C. Retest patients capillary blood glucose (CBG). D. If result greater  than or equal to 110 mg/dl, proceed with HBO. If result less than 110 mg/dl, notify HBO physician and consider holding HBO. If result is 131 mg/dl to 249 mg/dl: A. Proceed with HBO. [name], [name] ([id number]) [id number] Page 6 of 13 A. Notify HBO physician and await physician orders. B. It is recommended to hold HBO and do If result is 250 mg/dl or greater: blood/urine ketone testing. C. If the result meets the hospital definition of a critical result, follow hospital policy. POST-HBO GLYCEMIA INTERVENTIONS ACTION INTERVENTION Obtain post HBO capillary blood glucose (ensure 1 physician order is in chart). A. Notify HBO physician and await physician orders. 2 If result is 70 mg/dl or below: B. If the result meets the hospital definition of a critical result, follow hospital policy. A. Give patient an 8 ounce Glucerna Shake, an 8 ounce Ensure, or 8 ounces of a Glucerna/Ensure equivalent dietary supplement*. B. Wait 15 minutes for symptoms of If result is 71 mg/dl to 100 mg/dl: hypoglycemia (i.e. nervousness, anxiety, sweating, chills, clamminess, irritability, confusion, tachycardia or dizziness). C. If patient asymptomatic, discharge patient. If patient symptomatic, repeat capillary blood glucose (CBG) and notify HBO physician. If result is 101 mg/dl to 249 mg/dl: A. Discharge patient. A. Notify HBO physician and await physician orders. B. It is recommended to do  blood/urine ketone If result is 250 mg/dl or greater: testing. C. If the result meets the hospital definition of a critical result, follow hospital policy. *Juice or candies are NOT equivalent products. If patient refuses the Glucerna or Ensure, please consult the hospital dietitian for an appropriate substitute. Electronic Signature(s) Signed: 01/17/2023 9:34:58 AM By: Fredirick Maudlin MD FACS Signed: 01/19/2023 7:57:59 AM By: Blanche East RN Entered By: Blanche East on 01/17/2023 09:24:58 -------------------------------------------------------------------------------- Problem List Details Patient Name: Date of Service: [name], [name] 01/17/2023 8:30 A M Medical Record Number: [id number] Patient Account Number: [id number] Date of Birth/Sex: Treating RN: 30-Jan-1956 (67 y.o. 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 09/10/2022 No Yes L97.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 09/10/2022 No Yes L98.492 Non-pressure chronic ulcer of skin of other sites with fat layer exposed 12/17/2022 No Yes L72.3 Sebaceous cyst 12/17/2022 No Yes [id number] Other chronic osteomyelitis, left ankle and foot 09/10/2022 No Yes E11.65 Type 2 diabetes mellitus with hyperglycemia 09/10/2022 No Yes [name], [name] ([id number]) [phone].pdf Page 7 of 13 E11.621 Type 2 diabetes mellitus with foot ulcer 09/10/2022 No Yes Inactive Problems Resolved Problems Electronic Signature(s) Signed: 01/17/2023 9:10:17 AM By: Fredirick Maudlin MD FACS Entered By: Fredirick Maudlin on 01/17/2023 09:10:16 -------------------------------------------------------------------------------- Progress  Note Details Patient Name: Date of Service: [name], [name] 01/17/2023 8:30 A M Medical Record Number: [id number] Patient Account Number: [id number] Date of Birth/Sex: Treating RN: 1956-08-16 (67 y.o. M) Primary Care Provider: Nelda Bucks Other Clinician: Referring Provider: Treating Provider/Extender: Doristine Bosworth in Treatment: 18 Subjective Chief Complaint Information obtained from Patient Patients presents for treatment of an open diabetic ulcer and evaluation for hyperbaric oxygen therapy History of Present Illness (HPI) ADMISSION 09/10/2022 This is a 67 year old poorly controlled type II diabetic (last hemoglobin A1c 10.8%) who has had an ulcer on his heel for over 3 years. He has been seen in multiple wound care centers,  including [facility] and [facility]. He reports that at least 3 doctors have recommended that he undergo below-knee amputation. He most recently met with Dr. Catalina Gravel, a vascular surgeon affiliated with [facility]. 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 Unfortunately, the patient has some extenuating social circumstances including the fact that he cares for his wife who has stage IV colon cancer and still works, driving vehicles for [employer]. 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 He was recently hospitalized at [facility] for sepsis and currently has a PICC line through which he receives IV antibiotics. He reports having had another MRI during that hospital stay along with a chest x-ray and EKG. He apparently contacted one of the hyperbaric therapy techs here and asked a number of questions about hyperbaric oxygen treatments. He subsequently self-referred to our center to undergo further evaluation and management. I mention to him that [facility] actually has hyperbaric chambers, but he states that he  lives in [place] and this would be more convenient for him given the intensive nature of the therapy and time requirement. ABI in clinic today was 0.94. The patient actually has 2 wounds. There is a wound on the dorsum of his left foot with heavy black eschar and slough present. After debridement, this was demonstrated to involve the muscle and the extensor tendons are exposed. On his heel, there is essentially a "shark bite" type wound, with much of the heel fat pad absent. The muscle layer is exposed. There is blue-green staining around the perimeter of the wound, but no significant odor. He says he has been applying collagen to the wound on his heel and Silvadene and Betadine to the wound on his dorsal foot. 09/20/2022: The heel wound is quite macerated with wet periwound callus. There is slough accumulation on the surface. The dorsal foot wound looks better this week. There is still exposed tendon, but it is fairly clean with just a little biofilm buildup. We are still working on gathering the required documentation to submit for pretreatment review for hyperbaric oxygen therapy. 09/29/2022: The dorsal foot wound continues to improve. I do not see any exposed tendon at this point. There is just some slough accumulation on the wound surface. He continues to have very wet macerated periwound callus on his heel. There is slough on the surface, but it is loose and thin. There is an area of undermining at the 11 o'clock position, but the overlying skin and subcutaneous tissue is healthy and viable. He has been approved for hyperbaric oxygen therapy and will start treatment tomorrow. 10/06/2022: Continued contraction and improvement of the dorsal foot wound. There is just a little bit of slough accumulation on the wound surface. The periwound callus continues to accumulate on the heel wound and it is quite macerated. It is also persistently found with blue-green staining present. He did initiate his  hyperbaric oxygen therapy, but has had significant difficulty with decompression. He will be going to ENT to have PE tubes placed. 10/18/2022: His hyperbaric oxygen therapy is on hold while his otological issues are being addressed. He came in again today with the periwound skin on both the dorsal aspect of his foot and his calcaneus completely macerated. The blue-green discoloration, however, has abated and the undermining on the calcaneus has improved. The dorsal foot wound has also contracted somewhat. 10/26/2022: The dorsal foot wound continues to contract and fill  with good granulation tissue. The heel, once again, has a rim of macerated callus, but the undermining and tunneling continues to contract. He has completed his oral antibiotics and has been using the [facility] topical compounded antibiotic for his dressing changes at home. He is scheduled to see ENT this afternoon. 11/08/2022: The dorsal foot wound is flush with the surrounding skin and has a good granulation tissue surface. There is some slough accumulation. As usual, the heel has a rim of macerated callus but the dimensions are smaller and the tunneling and undermining have contracted further. He has resumed his hyperbaric oxygen therapy. 12/12; patient seen for wound evaluation. He is tolerating HBO although we could not dive him yesterday because of relative hypoglycemia and the fact he had given himself NovoLog insulin before he came to clinic. Today his blood sugar is in the 180 range she should be fine. He has a large wound on the plantar calcaneus on the right and a more superficial area on the dorsal foot. He is using a scooter for offloading 11/30/2022: The dorsal foot wound continues to contract. There is some slough on the wound surface. The large calcaneus wound has heaped up wet callus [name], [name] ([id number]) [phone].pdf Page 8 of 13 around the margins. Continued undermining. 12/08/2022: The  dorsal foot wound is flat and flush with the surrounding skin surface. There is some slough present. Once again, the calcaneal wound has thick, absolutely macerated callus hanging off in tatters around the edges. The patient cannot explain to me why this part of his wound gets so wet. There is slough on the surface. The undermined portion of the wound has filled in. 12/17/2022: Earlier this week, he presented for his hyperbaric oxygen therapy and was hypotensive and ill-appearing. He also had what appeared to be an infected sebaceous cyst on his right upper arm. He was sent to the emergency department. He was given a fluid bolus and the ED provider lanced the cyst. He was prescribed doxycycline. He seems to be feeling better today. He notes that he has had copious drainage from his foot. On further questioning, he states that he has not been taking his oral diuretic. The dorsal foot wound has expanded somewhat, but is actually more superficial. The plantar heel wound is about the same. 12/27/2022: The dorsal foot wound has epithelialized further. The plantar foot wound is about the same size but has heaped up macerated callus around the perimeter. The intake nurse noted a tiny fragment of bone on his dressings and there is now an area at about the 5 o'clock position where 1 can palpate bone through a small slit in the soft tissues. The wound on his arm is contracting but he still likely has cyst wall present due to the manner in which the infected sebaceous cyst was addressed in the ER. 01/01/2023: The wound on his shoulder has contracted considerably. The dorsal foot wound has some slough on the surface but also looks to be improving. The plantar foot wound is basically unchanged. 01/10/2023: The wound on his shoulder continues to diminish in size and depth. The dorsal foot wound is improving with just a light layer of slough on the surface. The plantar foot wound appears to be contracting but still has  thick wet callus around the perimeter. We did in chamber T cPO2 monitoring during his last hyperbaric oxygen therapy. The results show that he has extremely poor tissue perfusion at room air and 1 atm of pressure. He does respond  extremely well to hyperbaric oxygen therapy, however. 01/17/2023: The wound on his shoulder is down to 0.6 cm in depth, down from 1 cm last week. The dorsal foot wound is quite a bit smaller this week with just some light slough on the surface. The plantar foot wound is also smaller and the area of bone that was exposed at the posterior heel is now covered. There is still substantial wet callus around the perimeter. He is scheduled to undergo formal vascular studies sometime next week. Patient History Family History Cancer - Father,Mother, Diabetes - Mother,Father, Lung Disease - Father, Thyroid Problems - Mother, No family history of Heart Disease, Hereditary Spherocytosis, Hypertension, Kidney Disease, Seizures, Stroke, Tuberculosis. Social History Never smoker, Marital Status - Married, Alcohol Use - Never, Drug Use - No History, Caffeine Use - Daily. Medical History Eyes Patient has history of Cataracts Denies history of Glaucoma, Optic Neuritis Ear/Nose/Mouth/Throat Denies history of Chronic sinus problems/congestion, Middle ear problems Respiratory Patient has history of Sleep Apnea Cardiovascular Patient has history of Hypertension Gastrointestinal Denies history of Cirrhosis , Colitis, Crohnoos, Hepatitis A, Hepatitis B, Hepatitis C Endocrine Patient has history of Type II Diabetes Immunological Denies history of Lupus Erythematosus, Raynaudoos, Scleroderma Musculoskeletal Patient has history of Osteomyelitis - 2023 Neurologic Patient has history of Neuropathy - Bila lower extremities Oncologic Denies history of Received Chemotherapy, Received Radiation Psychiatric Denies history of Anorexia/bulimia, Confinement Anxiety Hospitalization/Surgery  History - I and D Left calcaneus. - back surgery- laminectomy. - eye surgery- Bila cataracts. - shoulder arthroscopy. Medical A Surgical History Notes nd Cardiovascular hyperlipidemia Genitourinary AKI Objective Constitutional Hypertensive, asymptomatic. no acute distress. Vitals Time Taken: 8:38 AM, Height: 74 in, Weight: 245 lbs, BMI: 31.5, Temperature: 97.7 F, Pulse: 88 bpm, Respiratory Rate: 18 breaths/min, Blood Pressure: 167/90 mmHg, Capillary Blood Glucose: 237 mg/dl. Respiratory Normal work of breathing on room air. [name], [name] ([id number]) [id number] Page 9 of 13 General Notes: 01/17/2023: The wound on his shoulder is down to 0.6 cm in depth, down from 1 cm last week. The dorsal foot wound is quite a bit smaller this week with just some light slough on the surface. The plantar foot wound is also smaller and the area of bone that was exposed at the posterior heel is now covered. There is still substantial wet callus around the perimeter. Integumentary (Hair, Skin) Wound #1 status is Open. Original cause of wound was Pressure Injury. The date acquired was: 03/09/2019. The wound has been in treatment 18 weeks. The wound is located on the Left Calcaneus. The wound measures 6cm length x 5cm width x 0.3cm depth; 23.562cm^2 area and 7.069cm^3 volume. There is bone and Fat Layer (Subcutaneous Tissue) exposed. There is no tunneling or undermining noted. There is a large amount of serosanguineous drainage noted. The wound margin is thickened. There is large (67-100%) red, pale granulation within the wound bed. There is a small (1-33%) amount of necrotic tissue within the wound bed including Adherent Slough. The periwound skin appearance had no abnormalities noted for color. The periwound skin appearance exhibited: Callus, Maceration. Periwound temperature was noted as No Abnormality. Wound #2 status is Open. Original cause of wound was Pressure Injury. The date  acquired was: 07/09/2022. The wound has been in treatment 18 weeks. The wound is located on the Left,Dorsal Foot. The wound measures 2cm length x 2.6cm width x 0.1cm depth; 4.084cm^2 area and 0.408cm^3 volume. There is Fat Layer (Subcutaneous Tissue) exposed. There is no tunneling or undermining noted. There is a medium amount  of serosanguineous drainage noted. The wound margin is flat and intact. There is small (1-33%) red granulation within the wound bed. There is a large (67-100%) amount of necrotic tissue within the wound bed including Adherent Slough. The periwound skin appearance had no abnormalities noted for moisture. The periwound skin appearance had no abnormalities noted for color. The periwound skin appearance exhibited: Scarring. Periwound temperature was noted as No Abnormality. Wound #3 status is Open. Original cause of wound was Gradually Appeared. The date acquired was: 12/14/2022. The wound has been in treatment 4 weeks. The wound is located on the Right Shoulder. The wound measures 0.2cm length x 0.2cm width x 0.6cm depth; 0.031cm^2 area and 0.019cm^3 volume. There is Fat Layer (Subcutaneous Tissue) exposed. There is no tunneling or undermining noted. There is a medium amount of serosanguineous drainage noted. The wound margin is distinct with the outline attached to the wound base. There is no granulation within the wound bed. There is no necrotic tissue within the wound bed. The periwound skin appearance had no abnormalities noted for moisture. The periwound skin appearance had no abnormalities noted for color. The periwound skin appearance exhibited: Rash. Periwound temperature was noted as No Abnormality. 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 There was a Selective/Open Wound Skin/Epidermis Debridement with a total area of 30 sq cm performed by Fredirick Maudlin, MD. With the following instrument(s): Curette to remove Non-Viable tissue/material. Material removed includes Callus, Slough, and Skin: Epidermis. No specimens were taken. A time out was conducted at 08:58, prior to the start of the procedure. A Minimum amount of bleeding was controlled with Pressure. The procedure was tolerated well. Post Debridement Measurements: 6cm length x 5cm width x 0.3cm depth; 7.069cm^3 volume. Character of Wound/Ulcer Post Debridement requires further debridement. Severity of Tissue Post Debridement is: Fat layer exposed. Post procedure Diagnosis Wound #1: Same as Pre-Procedure General Notes: Scribed for Dr. Celine Ahr by Blanche East, RN. 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 There was a Selective/Open Wound Non-Viable Tissue Debridement with a total area of 5.2 sq cm performed by Fredirick Maudlin, MD. With the following instrument(s): Curette to remove Non-Viable tissue/material. Material removed includes [facility]. No specimens were taken. A time out was conducted at 08:58, prior to the start of the procedure. A Minimum amount of bleeding was controlled with Pressure. The procedure was tolerated well. Post Debridement Measurements: 2cm length x 2.6cm width x 0.1cm depth; 0.408cm^3 volume. Character of Wound/Ulcer Post Debridement requires further debridement. Severity of Tissue Post  Debridement is: Fat layer exposed. Post procedure Diagnosis Wound #2: Same as Pre-Procedure General Notes: Scribed for Dr. Celine Ahr by Blanche East, RN. Plan Follow-up Appointments: Return Appointment in 1 week. - Dr. Celine Ahr rm 4 Monday 01/24/23 at 8:30am Cellular or Tissue Based Products: Other Cellular or Tissue Based Products Orders/Instructions: - run insurance for Apligraf Bathing/ Shower/ Hygiene: May shower with  protection but do not get wound dressing(s) wet. Protect dressing(s) with water repellant cover (for example, large plastic bag) or a cast cover and may then take shower. Negative Presssure Wound Therapy: Wound Vac to wound continuously at 165m/hg pressure - to left calcaneus Black Foam Edema Control - Lymphedema / SCD / Other:[name], [name]([id number] Page 10 of 13 Avoid standing for long periods of time. Off-Loading: Wound #1 Left Calcaneus: Other: - use knee scooter to ambulate, minimal weight bearing right foot Home Health: Dressing changes to be completed by [facility] Monday / Wednesday / Friday except [facility] Other [facility] Health Orders/Instructions: - [facility] Hyperbaric Oxygen Therapy: Wound #1 Left Calcaneus: Evaluate for HBO Therapy - Continue HBOT for additional 20 treatments for total of 60 treatments Indication: - chronic refractory osteomyelitis If appropriate for treatment, begin HBOT per protocol: 2.5 ATA for 90 Minutes with 2 Five (5) Minute Air Breaks T Number of Treatments: - 60 otal One treatments per day (delivered Monday through Friday unless otherwise specified in Special Instructions below): Finger stick Blood Glucose Pre- and Post- HBOT Treatment. Follow Hyperbaric Oxygen Glycemia Protocol Afrin (Oxymetazoline HCL) 0.05% nasal spray - 1 spray in both nostrils daily as needed prior to HBO treatment for difficulty clearing ears WOUND #1: - Calcaneus  Wound Laterality: Left Cleanser: Normal Saline 3 x Per Week/30 Days Discharge Instructions: Cleanse the wound with Normal Saline prior to applying a clean dressing using gauze sponges, not tissue or cotton balls. Cleanser: Soap and Water 3 x Per Week/30 Days Discharge Instructions: May shower and wash wound with dial antibacterial soap and water prior to dressing change. Peri-Wound Care: keystone 3 x Per Week/30 Days Prim Dressing: VAC 3 x Per Week/30 Days ary Secured With: Coban Self-Adherent Wrap 4x5 (in/yd) 3 x Per Week/30 Days Discharge Instructions: Secure with Coban as directed. Secured With: [employer] 4.5x3.1 (in/yd) 3 x Per Week/30 Days Discharge Instructions: Secure with Kerlix as directed. WOUND #2: - Foot Wound Laterality: Dorsal, Left Cleanser: Normal Saline Discharge Instructions: Cleanse the wound with Normal Saline prior to applying a clean dressing using gauze sponges, not tissue or cotton balls. Cleanser: Soap and Water Discharge Instructions: May shower and wash wound with dial antibacterial soap and water prior to dressing change. Peri-Wound Care: keystone Prim Dressing: Promogran Prisma Matrix, 4.34 (sq in) (silver collagen) ary Discharge Instructions: Moisten collagen with hydrogel Secondary Dressing: Zetuvit Plus Silicone Border Dressing 4x4 (in/in) Discharge Instructions: Apply silicone border over primary dressing as directed. Secured With: Coban Self-Adherent Wrap 4x5 (in/yd) Discharge Instructions: Secure with Coban as directed. Secured With: [employer] 4.5x3.1 (in/yd) Discharge Instructions: Secure with Kerlix as directed. WOUND #3: - Shoulder Wound Laterality: Right Cleanser: Soap and Water 1 x Per Day/30 Days Discharge Instructions: May shower and wash wound with dial antibacterial soap and water prior to dressing change. Cleanser: Wound Cleanser 1 x Per Day/30 Days Discharge Instructions: Cleanse the wound with wound cleanser prior to  applying a clean dressing using gauze sponges, not tissue or cotton balls. Peri-Wound Care: Skin Prep 1 x Per Day/30 Days Discharge Instructions: Use skin prep as directed Prim Dressing: Iodoform packing strip 1/4 (in) 1 x Per Day/30 Days ary Discharge Instructions: Lightly pack as instructed Secondary Dressing: Zetuvit Plus Silicone Border Dressing 4x4 (in/in) 1 x Per Day/30 Days Discharge Instructions: Apply silicone border over primary dressing as directed. 01/17/2023: The wound on his shoulder is down to 0.6 cm  in depth, down from 1 cm last week. The dorsal foot wound is quite a bit smaller this week with just some light slough on the surface. The plantar foot wound is also smaller and the area of bone that was exposed at the posterior heel is now covered. There is still substantial wet callus around the perimeter. The shoulder wound did not require debridement. We will continue to pack this with iodoform packing strips. I debrided slough off of the dorsal foot wound. We will continue Prisma silver collagen here. I debrided callus, epidermis, and biofilm from the heel wound. We will use his Keystone topical antimicrobial compound and wound VAC for this site. He will continue hyperbaric oxygen therapy. Follow-up in 1 week. Electronic Signature(s) Signed: 01/30/2023 2:19:36 PM By: Deon Pilling RN, BSN Signed: 02/07/2023 5:14:33 PM By: Fredirick Maudlin MD FACS Previous Signature: 01/17/2023 9:13:10 AM Version By: Fredirick Maudlin MD FACS Entered By: Deon Pilling on 01/30/2023 14:02:40 -------------------------------------------------------------------------------- HxROS Details Patient Name: Date of Service: [name], [name] 01/17/2023 8:30 A M Medical Record Number: [id number] Patient Account Number: [id number] Date of Birth/Sex: Treating RN: [birth year] (67 y.o. M) Primary Care Provider: Nelda Bucks Other Clinician: Referring Provider: Treating Provider/Extender: Doristine Bosworth in Treatment: [address] [place] ([id number]) [id number] Page 11 of 13 Eyes Medical History: Positive for: Cataracts Negative for: Glaucoma; Optic Neuritis Ear/Nose/Mouth/Throat Medical History: Negative for: Chronic sinus problems/congestion; Middle ear problems Respiratory Medical History: Positive for: Sleep Apnea Cardiovascular Medical History: Positive for: Hypertension Past Medical History Notes: hyperlipidemia Gastrointestinal Medical History: Negative for: Cirrhosis ; Colitis; Crohns; Hepatitis A; Hepatitis B; Hepatitis C Endocrine Medical History: Positive for: Type II Diabetes Time with diabetes: 20 years Treated with: Insulin Blood sugar tested every day: Yes Tested : 2-3 Genitourinary Medical History: Past Medical History Notes: AKI Immunological Medical History: Negative for: Lupus Erythematosus; Raynauds; Scleroderma Musculoskeletal Medical History: Positive for: Osteomyelitis - 2023 Neurologic Medical History: Positive for: Neuropathy - Bila lower extremities Oncologic Medical History: Negative for: Received Chemotherapy; Received Radiation Psychiatric Medical History: Negative for: Anorexia/bulimia; Confinement Anxiety HBO Extended History Items Eyes: Cataracts Immunizations Pneumococcal Vaccine: Received Pneumococcal Vaccination: Yes Received Pneumococcal Vaccination On or After 60th Birthday: Yes [place], [place] ([id number]) [id number] Page 12 of 13 Yes Hospitalization / Surgery History Type of Hospitalization/Surgery I and D Left calcaneus back surgery- laminectomy eye surgery- Bila cataracts shoulder arthroscopy Family and Social History Cancer: Yes - Father,Mother; Diabetes: Yes - Mother,Father; Heart Disease: No; Hereditary Spherocytosis: No; Hypertension: No; Kidney Disease: No; Lung Disease: Yes - Father; Seizures: No;  Stroke: No; Thyroid Problems: Yes - Mother; Tuberculosis: No; Never smoker; Marital Status - Married; Alcohol Use: Never; Drug Use: No History; Caffeine Use: Daily; Financial Concerns: No; Food, Clothing or Shelter Needs: No; Support System Lacking: No; Transportation Concerns: No Electronic Signature(s) Signed: 01/17/2023 9:34:58 AM By: Fredirick Maudlin MD FACS Entered By: Fredirick Maudlin on 01/17/2023 09:11:29 -------------------------------------------------------------------------------- SuperBill Details Patient Name: Date of Service: [name], [name] 01/17/2023 Medical Record Number: [id number] Patient Account Number: [id number] Date of Birth/Sex: Treating RN: 07-Sep-1956 (67 y.o. 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.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 L98.492 Non-pressure chronic ulcer of skin of other sites with fat layer exposed L72.3  Sebaceous cyst [id number] Other chronic osteomyelitis, left ankle and foot E11.65 Type 2 diabetes mellitus with hyperglycemia E11.621 Type 2 diabetes mellitus with foot ulcer Facility Procedures : The patient participates with Medicare or their insurance follows the Medicare Facility Guidelines: CPT4 Code Description Modifier Quantity [id number] - DEBRIDE WOUND 1ST 20 SQ CM OR < 1 ICD-10 Diagnosis Description L97.424 Non-pressure chronic ulcer  of left heel and midfoot with necrosis of bone L97.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 : The patient participates with Medicare or their insurance follows the Medicare Facility Guidelines: [id number] - DEBRIDE WOUND EA ADDL 20 SQ CM 1 ICD-10 Diagnosis  Description 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 L97.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 : The patient participates with Medicare or their insurance follows the Medicare Facility Guidelines: [id number] - WOUND VAC-50 SQ CM OR LESS 1 Physician Procedures : CPT4 Code Description Modifier [id number] - WC PHYS LEVEL 4 - EST PT 25 ICD-10 Diagnosis Description 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 L98.492 Non-pressure chronic ulcer of skin of other sites with fat layer exposed E11.621 Type 2 diabetes mellitus with foot ulcer Quantity: 1 : [id number] - WC PHYS DEBR WO ANESTH 20 SQ CM ICD-10 Diagnosis Description DANIYEL, GOODIE ([id number]) [id number] 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

## 2023-01-19 NOTE — Progress Notes (Signed)
[name], [name] ([id number]) [id number] Page 1 of 8 Visit Report for 01/17/2023 Arrival Information Details Patient Name: Date of Service: [name], [name] 01/17/2023 8:30 A M Medical Record Number: [id number] Patient Account Number: [id number] Date of Birth/Sex: Treating RN: 1956/09/04 (67 y.o. [name] Primary Care Karielle Davidow: Nelda Bucks Other Clinician: Referring Kammi Hechler: Treating Mayah Urquidi/Extender: Doristine Bosworth in Treatment: 18 Visit Information History Since Last Visit Added or deleted any medications: No Patient Arrived: Knee Scooter Any new allergies or adverse reactions: No Arrival Time: 08:37 Had a fall or experienced change in No Accompanied By: self activities of daily living that may affect Transfer Assistance: Manual risk of falls: Patient Identification Verified: Yes Signs or symptoms of abuse/neglect since last visito No Patient Requires Transmission-Based Precautions: No Hospitalized since last visit: No Patient Has Alerts: No Implantable device outside of the clinic excluding No cellular tissue based products placed in the center since last visit: Has Dressing in Place as Prescribed: Yes Pain Present Now: No Electronic Signature(s) Signed: 01/19/2023 7:57:59 AM By: Blanche East RN Entered By: Blanche East on 01/17/2023 08:37:32 -------------------------------------------------------------------------------- Lower Extremity Assessment Details Patient Name: Date of Service: [name], [name] 01/17/2023 8:30 A M Medical Record Number: [id number] Patient Account Number: [id number] Date of Birth/Sex: Treating RN: 12/27/1955 (67 y.o. [name] Primary Care Dekisha Mesmer: Nelda Bucks Other Clinician: Referring Lilyana Lippman: Treating Leilanni Halvorson/Extender: Doristine Bosworth in Treatment: 18 Edema Assessment Assessed: [name]: No] Patrice [name]: No] Edema: [Left: Ye] [Right:  s] Calf Left: Right: Point of Measurement: From Medial Instep 46.5 cm Ankle Left: Right: Point of Measurement: From Medial Instep 25 cm Vascular Assessment Pulses: Dorsalis Pedis Palpable: [Left:Yes] [Right:[id number] Page 2 of 8] Electronic Signature(s) Signed: 01/19/2023 7:57:59 AM By: Blanche East RN Entered By: Blanche East on 01/17/2023 08:45:09 -------------------------------------------------------------------------------- Multi Wound Chart Details Patient Name: Date of Service: [name], [name] 01/17/2023 8:30 A M Medical Record Number: [id number] Patient Account Number: [id number] Date of Birth/Sex: Treating RN: 12-Nov-1956 (67 y.o. M) Primary Care Oshua Mcconaha: Nelda Bucks Other Clinician: Referring Kashina Mecum: Treating Elaine Roanhorse/Extender: Doristine Bosworth in Treatment: 18 Vital Signs Height(in): 74 Capillary Blood Glucose(mg/dl): 237 Weight(lbs): 245 Pulse(bpm): 53 Body Mass Index(BMI): 31.5 Blood Pressure(mmHg): 167/90 Temperature(F): 97.7 Respiratory Rate(breaths/min): 18 [1:Photos:] Left Calcaneus Left, Dorsal Foot Right Shoulder Wound Location: Pressure Injury Pressure Injury Gradually Appeared Wounding Event: Diabetic Wound/Ulcer of the Lower Diabetic Wound/Ulcer of the Lower Cyst Primary Etiology: Extremity Extremity Cataracts, Sleep Apnea, Hypertension, Cataracts, Sleep Apnea, Hypertension, Cataracts, Sleep Apnea, Hypertension, Comorbid History: Type II Diabetes, Osteomyelitis, Type II Diabetes, Osteomyelitis, Type II Diabetes, Osteomyelitis, Neuropathy Neuropathy Neuropathy 03/09/2019 07/09/2022 12/14/2022 Date Acquired: 18 18 4  Weeks of Treatment: Open Open Open Wound Status: No No No Wound Recurrence: 6x5x0.3 2x2.6x0.1 0.2x0.2x0.6 Measurements L x W x D (cm) 23.562 4.084 0.031 A (cm) : rea 7.069 0.408 0.019 Volume (cm) : -9.10% 56.70% 75.40% % Reduction in A rea: 67.30% 56.70% 24.00% %  Reduction in Volume: Grade 3 Grade 2 Full Thickness Without Exposed Classification: Support Structures Large Medium Medium Exudate A mount: Serosanguineous Serosanguineous Serosanguineous Exudate Type: red, brown red, brown red, brown Exudate Color: Thickened Flat and Intact Distinct, outline attached Wound Margin: Large (67-100%) Small (1-33%) None Present (0%) Granulation A mount: Red, Pale Red N/A Granulation Quality: Small (1-33%) Large (67-100%) None Present (0%) Necrotic A mount: Fat Layer (Subcutaneous Tissue): Yes Fat Layer (Subcutaneous Tissue): Yes Fat Layer (Subcutaneous Tissue): Yes Exposed Structures: Bone: Yes Fascia: No  Fascia: No Fascia: No Tendon: No Tendon: No Tendon: No Muscle: No Muscle: No Muscle: No Joint: No Joint: No Joint: No Bone: No Bone: No Small (1-33%) Small (1-33%) Small (1-33%) Epithelialization: Debridement - Selective/Open Wound Debridement - Selective/Open Wound N/A Debridement: Pre-procedure Verification/Time Out 08:58 08:58 N/A Taken: Callus, [facility] N/A Tissue Debrided: Skin/Epidermis Non-Viable Tissue N/A Level: 30 5.2 N/A Debridement A (sq cm): rea Curette Curette N/A Instrument: Minimum Minimum N/A Bleeding: SOHIL, TIMKO ([id number]) [id number].pdf Page 3 of 8 Pressure Pressure N/A Hemostasis A chieved: Procedure was tolerated well Procedure was tolerated well N/A Debridement Treatment Response: 6x5x0.3 2x2.6x0.1 N/A Post Debridement Measurements L x W x D (cm) 7.069 0.408 N/A Post Debridement Volume: (cm) Callus: Yes Scarring: Yes Rash: Yes Periwound Skin Texture: Maceration: Yes No Abnormalities Noted No Abnormalities Noted Periwound Skin Moisture: No Abnormalities Noted No Abnormalities Noted No Abnormalities Noted Periwound Skin Color: No Abnormality No Abnormality No Abnormality Temperature: Debridement Debridement N/A Procedures Performed: Negative Pressure Wound  Therapy Maintenance (NPWT) Treatment Notes Electronic Signature(s) Signed: 01/17/2023 9:10:21 AM By: Fredirick Maudlin MD FACS Entered By: Fredirick Maudlin on 01/17/2023 09:10:21 -------------------------------------------------------------------------------- Negative Pressure Wound Therapy Maintenance (NPWT) Details Patient Name: Date of Service: [name], [name] [name] 01/17/2023 8:30 A M Medical Record Number: [id number] Patient Account Number: [id number] Date of Birth/Sex: Treating RN: [birth year] (67 y.o. [name] Primary Care Leandrew Keech: Nelda Bucks Other Clinician: Referring Alick Lecomte: Treating Josalynn Johndrow/Extender: Doristine Bosworth in Treatment: 18 NPWT Maintenance Performed for: Wound #1 Left Calcaneus Additional Injuries Covered: No Performed By: Blanche East, RN Type: VAC System Coverage Size (sq cm): 30 Pressure Type: Constant Pressure Setting: 125 mmHG Drain Type: None Primary Contact: Non-Adherent Sponge/Dressing Type: Foam, Black Date Initiated: 12/27/2022 Dressing Removed: No Quantity of Sponges/Gauze Removed: 1 Canister Changed: No Canister Exudate Volume: 20 Dressing Reapplied: No Quantity of Sponges/Gauze Inserted: 1 Respones T Treatment: o tolerated well Days On NPWT : 22 Post Procedure Diagnosis Same as Pre-procedure Notes Scribed for Dr. Celine Ahr by Blanche East, RN Electronic Signature(s) Signed: 01/19/2023 7:57:59 AM By: Blanche East RN Entered By: Blanche East on 01/17/2023 09:02:00 Pain Assessment Details -------------------------------------------------------------------------------- [name] ([id number]) [id number] Page 4 of 8 Patient Name: Date of Service: [place], [name] 01/17/2023 8:30 A M Medical Record Number: [id number] Patient Account Number: [id number] Date of Birth/Sex: Treating RN: August 03, 1956 (67 y.o. [name] Primary Care Madie Cahn: Nelda Bucks Other  Clinician: Referring Cullen Vanallen: Treating Alysandra Lobue/Extender: Doristine Bosworth in Treatment: 18 Active Problems Location of Pain Severity and Description of Pain Patient Has Paino No Site Locations Rate the pain. Current Pain Level: 0 Pain Management and Medication Current Pain Management: Electronic Signature(s) Signed: 01/19/2023 7:57:59 AM By: Blanche East RN Entered By: Blanche East on 01/17/2023 08:38:46 -------------------------------------------------------------------------------- Wound Assessment Details Patient Name: Date of Service: [name], [name] 01/17/2023 8:30 A M Medical Record Number: [id number] Patient Account Number: [id number] Date of Birth/Sex: Treating RN: Apr 22, 1956 (67 y.o. [name] Primary Care Tavonna Worthington: Nelda Bucks Other Clinician: Referring Darlean Warmoth: Treating Tabatha Razzano/Extender: Doristine Bosworth in Treatment: 18 Wound Status Wound Number: 1 Primary Diabetic Wound/Ulcer of the Lower Extremity Etiology: Wound Location: Left Calcaneus Wound Open Wounding Event: Pressure Injury Status: Date Acquired: 03/09/2019 Comorbid Cataracts, Sleep Apnea, Hypertension, Type II Diabetes, Weeks Of Treatment: 18 History: Osteomyelitis, Neuropathy Clustered Wound: No Photos AMAY, MIJANGOS ([id number]) [id number] Page 5 of 8 Wound Measurements Length: (cm) 6 Width: (cm) 5 Depth: (cm) 0.3  Area: (cm) 23.562 Volume: (cm) 7.069 % Reduction in Area: -9.1% % Reduction in Volume: 67.3% Epithelialization: Small (1-33%) Tunneling: No Undermining: No Wound Description Classification: Grade 3 Wound Margin: Thickened Exudate Amount: Large Exudate Type: Serosanguineous Exudate Color: red, brown Foul Odor After Cleansing: No Slough/Fibrino Yes Wound Bed Granulation Amount: Large (67-100%) Exposed Structure Granulation Quality: Red, Pale Fascia Exposed: No Necrotic Amount: Small  (1-33%) Fat Layer (Subcutaneous Tissue) Exposed: Yes Necrotic Quality: Adherent Slough Tendon Exposed: No Muscle Exposed: No Joint Exposed: No Bone Exposed: Yes Periwound Skin Texture Texture Color No Abnormalities Noted: No No Abnormalities Noted: Yes Callus: Yes Temperature / Pain Temperature: No Abnormality Moisture No Abnormalities Noted: No Maceration: Yes Electronic Signature(s) Signed: 01/19/2023 7:57:59 AM By: Tommie Ard RN Entered By: Tommie Ard on 01/17/2023 08:50:37 -------------------------------------------------------------------------------- Wound Assessment Details Patient Name: Date of Service: [name], [name] 01/17/2023 8:30 A M Medical Record Number: [id number] Patient Account Number: [id number] Date of Birth/Sex: Treating RN: [birth year] (67 y.o. [name] Primary Care Zyair Russi: Foye [name] Other Clinician: Referring Enrique Weiss: Treating Kinney Sackmann/Extender: Kandis Cocking in Treatment: 18 Wound Status Wound Number: 2 Primary Diabetic Wound/Ulcer of the Lower Extremity Etiology: Wound Location: Left, Dorsal Foot Wound Open Wounding Event: Pressure Injury Status: Date Acquired: 07/09/2022 Comorbid Cataracts, Sleep Apnea, Hypertension, Type II Diabetes, Weeks Of Treatment: 18 History: Osteomyelitis, Neuropathy Clustered Wound: No [name], [name] ([id number]) [id number] Page 6 of 8 Photos Wound Measurements Length: (cm) 2 Width: (cm) 2.6 Depth: (cm) 0.1 Area: (cm) 4.084 Volume: (cm) 0.408 % Reduction in Area: 56.7% % Reduction in Volume: 56.7% Epithelialization: Small (1-33%) Tunneling: No Undermining: No Wound Description Classification: Grade 2 Wound Margin: Flat and Intact Exudate Amount: Medium Exudate Type: Serosanguineous Exudate Color: red, brown Foul Odor After Cleansing: No Slough/Fibrino Yes Wound Bed Granulation Amount: Small (1-33%) Exposed Structure Granulation  Quality: Red Fascia Exposed: No Necrotic Amount: Large (67-100%) Fat Layer (Subcutaneous Tissue) Exposed: Yes Necrotic Quality: Adherent Slough Tendon Exposed: No Muscle Exposed: No Joint Exposed: No Bone Exposed: No Periwound Skin Texture Texture Color No Abnormalities Noted: No No Abnormalities Noted: Yes Scarring: Yes Temperature / Pain Temperature: No Abnormality Moisture No Abnormalities Noted: Yes Electronic Signature(s) Signed: 01/19/2023 7:57:59 AM By: Tommie Ard RN Entered By: Tommie Ard on 01/17/2023 08:54:10 -------------------------------------------------------------------------------- Wound Assessment Details Patient Name: Date of Service: [name], [name] 01/17/2023 8:30 A M Medical Record Number: [id number] Patient Account Number: [id number] Date of Birth/Sex: Treating RN: May 15, 1956 (67 y.o. [name] Primary Care Nestor Wieneke: Foye [name] Other Clinician: Referring Boysie Bonebrake: Treating Luretha Eberly/Extender: Kandis Cocking in Treatment: 18 Wound Status Wound Number: 3 Primary Cyst Etiology: Wound Location: Right Shoulder Wound Open Wounding Event: Gradually Appeared Status: Date Acquired: 12/14/2022 Comorbid Cataracts, Sleep Apnea, Hypertension, Type II Diabetes, Weeks Of Treatment: 4 History: Osteomyelitis, Neuropathy Clustered Wound: No [name], [name] ([id number]) [id number] Page 7 of 8 Photos Wound Measurements Length: (cm) 0.2 Width: (cm) 0.2 Depth: (cm) 0.6 Area: (cm) 0.031 Volume: (cm) 0.019 % Reduction in Area: 75.4% % Reduction in Volume: 24% Epithelialization: Small (1-33%) Tunneling: No Undermining: No Wound Description Classification: Full Thickness Without Exposed Support Structures Wound Margin: Distinct, outline attached Exudate Amount: Medium Exudate Type: Serosanguineous Exudate Color: red, brown Foul Odor After Cleansing: No Slough/Fibrino No Wound  Bed Granulation Amount: None Present (0%) Exposed Structure Necrotic Amount: None Present (0%) Fascia Exposed: No Fat Layer (Subcutaneous Tissue) Exposed: Yes Tendon Exposed: No Muscle Exposed: No Joint Exposed: No Bone Exposed: No Periwound  Skin Texture Texture Color No Abnormalities Noted: No No Abnormalities Noted: Yes Rash: Yes Temperature / Pain Temperature: No Abnormality Moisture No Abnormalities Noted: Yes Electronic Signature(s) Signed: 01/19/2023 7:57:59 AM By: Blanche East RN Entered By: Blanche East on 01/17/2023 08:54:37 -------------------------------------------------------------------------------- Vitals Details Patient Name: Date of Service: [name], [name] 01/17/2023 8:30 A M Medical Record Number: [id number] Patient Account Number: [id number] Date of Birth/Sex: Treating RN: 1956-07-29 (67 y.o. [name] Primary Care Sloane Junkin: Nelda Bucks Other Clinician: Referring Markice Torbert: Treating Vito Beg/Extender: Doristine Bosworth in Treatment: 18 Vital Signs Time Taken: 08:38 Temperature (F): 97.7 Height (in): 74 Pulse (bpm): 88 Weight (lbs): 245 Respiratory Rate (breaths/min): 18 Body Mass Index (BMI): 31.5 Blood Pressure (mmHg): 167/90 Capillary Blood Glucose (mg/dl): 237 Reference Range: 80 - 120 mg / dl [name], [name] ([id number]) [id number] Page 8 of 8 Electronic Signature(s) Signed: 01/19/2023 7:57:59 AM By: Blanche East RN Entered By: Blanche East on 01/17/2023 08:38:39

## 2023-01-20 ENCOUNTER — Encounter (HOSPITAL_BASED_OUTPATIENT_CLINIC_OR_DEPARTMENT_OTHER): Payer: Medicare Other | Admitting: General Surgery

## 2023-01-20 DIAGNOSIS — E11621 Type 2 diabetes mellitus with foot ulcer: Secondary | ICD-10-CM | POA: Diagnosis not present

## 2023-01-20 LAB — GLUCOSE, CAPILLARY
Glucose-Capillary: 292 mg/dL — ABNORMAL HIGH (ref 70–99)
Glucose-Capillary: 287 mg/dL — ABNORMAL HIGH (ref 70–99)
Glucose-Capillary: 251 mg/dL — ABNORMAL HIGH (ref 70–99)

## 2023-01-20 NOTE — Progress Notes (Signed)
JAICE, DIGIOIA R ([id number]) [id number] Page 1 of 2 Visit Report for 01/20/2023 Problem List Details Patient Name: Date of Service: [name], [name] 01/20/2023 10:00 A M Medical Record Number: [id number] Patient Account Number: [id number] Date of Birth/Sex: Treating RN: 1956/02/08 (67 y.o. [name], Meta.Reding Primary Care Provider: Nelda Bucks Other Clinician: Valeria Batman Referring Provider: Treating Provider/Extender: Doristine Bosworth in Treatment: 18 Active Problems ICD-10 Encounter Code Description Active Date MDM Diagnosis [phone] Non-pressure chronic ulcer of left heel and midfoot with 09/10/2022 No Yes necrosis of bone L97.523 Non-pressure chronic ulcer of other part of left foot with 09/10/2022 No Yes necrosis of muscle L98.492 Non-pressure chronic ulcer of skin of other sites with fat 12/17/2022 No Yes layer exposed L72.3 Sebaceous cyst 12/17/2022 No Yes E93.810 Other chronic osteomyelitis, left ankle and foot 09/10/2022 No Yes E11.65 Type 2 diabetes mellitus with hyperglycemia 09/10/2022 No Yes E11.621 Type 2 diabetes mellitus with foot ulcer 09/10/2022 No Yes Inactive Problems Resolved Problems Electronic Signature(s) Signed: 01/20/2023 1:20:21 PM By: Valeria Batman EMT Signed: 01/20/2023 1:42:06 PM By: Fredirick Maudlin MD FACS Entered By: Valeria Batman on 01/20/2023 13:20:20 SuperBill Details -------------------------------------------------------------------------------- [name] ([id number]) [id number] Page 2 of 2 Patient Name: Date of Service: [name], [name] 01/20/2023 Medical Record Number: [id number] Patient Account Number: [id number] Date of Birth/Sex: Treating RN: [birth year] (67 y.o. 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 L98.492 Non-pressure chronic ulcer of skin of other sites with fat layer exposed L72.3 Sebaceous cyst X54.008 Other chronic osteomyelitis, left ankle and foot E11.65 Type 2 diabetes mellitus with hyperglycemia E11.621 Type 2 diabetes mellitus with foot ulcer Facility Procedures The patient participates with Medicare or their insurance follows the Medicare Facility Guidelines CPT4 Code Description Modifier Quantity [id number]-(Facility Use Only) HBOT full body chamber, 109min , 4 ICD-10 Diagnosis Description [id number] Other chronic osteomyelitis, 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 Physician Procedures Quantity CPT4 Code Description Modifier [id number] - WC PHYS HYPERBARIC OXYGEN THERAPY 1 ICD-10 Diagnosis Description 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, 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

## 2023-01-20 NOTE — Progress Notes (Signed)
DARALD, UZZLE R ([id number]) [id number] Page 1 of 2 Visit Report for 01/19/2023 HBO Details Patient Name: Date of Service: [name], [name] LD R. 01/19/2023 10:00 A M Medical Record Number: [id number] Patient Account Number: [id number] Date of Birth/Sex: Treating RN: 1956-01-21 (67 y.o. 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 HBO Treatment Details Treatment Number: 44 Patient Type: Outpatient Chamber Type: Monoplace Chamber Serial #: [id number] Treatment Protocol: 2.5 ATA with 90 minutes oxygen, with two 5 minute air breaks Treatment Details Compression Rate Down: 1.5 psi / minute De-Compression Rate Up: 2.0 psi / minute A breaks and breathing ir Compress Tx Pressure periods Decompress Decompress Begins Reached (leave unused spaces Begins Ends blank) Chamber Pressure (ATA 1 2.5 2.5 2.5 2.5 2.5 - - 2.5 1 ) Clock Time (24 hr) 10:52 11:07 11:37 11:42 12:12 12:17 - - 12:47 12:58 Treatment Length: 126 (minutes) Treatment Segments: 4 Vital Signs Capillary Blood Glucose Reference Range: 80 - 120 mg / dl HBO Diabetic Blood Glucose Intervention Range: <131 mg/dl or >249 mg/dl Type: Time Vitals Blood Respiratory Capillary Blood Glucose Pulse Action Pulse: Temperature: Taken: Pressure: Rate: Glucose (mg/dl): Meter #: Oximetry (%) Taken: Pre 10:17 144/68 78 18 98.6 174 1 none per protocol Post 13:04 144/63 72 18 98.1 162 1 none per protocol Treatment Response Treatment Toleration: Well Treatment Completion Status: Treatment Completed without Adverse Event Treatment Notes Patient arrived. Blood  glucose was taken, 174 mg/dL. Patient ate 5 eggs and English muffin for breakfast, no insulin today. Mr. Thielman prepared for treatment, and was placed in the chamber after performing safety check. Chamber compressed at 1 psi/min until reaching 8 psig at which time he confirmed that he had normal ear equalization and rate set was increased to 2 psi/min until reaching treatment depth of 2.5 ATA. Patient tolerated treatment and decompression of the chamber at 2 psi/min. Post treatment blood glucose was 162 mg/dL and patient denied any issues with ear equalization and/or pain. Patient was stable upon discharge. Erma Joubert Notes No concerns with treatment given Physician HBO Attestation: I certify that I supervised this HBO treatment in accordance with Medicare guidelines. A trained emergency response team is readily available per Yes hospital policies and procedures. Continue HBOT as ordered. Yes Electronic Signature(s) Signed: 01/19/2023 5:27:02 PM By: Fredirick Maudlin MD FACS Previous Signature: 01/19/2023 3:38:34 PM Version By: Linton Ham MD Previous Signature: 01/19/2023 1:40:50 PM Version By: Donavan Burnet CHT EMT BS , , Previous Signature: 01/19/2023 1:34:56 PM Version By: Donavan Burnet CHT EMT BS , , Entered By: Fredirick Maudlin on 01/19/2023 17:27:02 [name] ([id number]) [id number] Page 2 of 2 -------------------------------------------------------------------------------- HBO Safety Checklist Details Patient Name: Date of Service: [place], [name] LD R. 01/19/2023 10:00 A M Medical Record Number: [id number] Patient Account Number: [id number] Date of Birth/Sex: Treating RN: [birth year] (67 y.o. [name] Primary Care Birgit Nowling: Nelda Bucks Other Clinician: Donavan Burnet Referring Yarelli Decelles: Treating Niala Stcharles/Extender: Doristine Bosworth in Treatment: 18 HBO Safety Checklist Items Safety Checklist Consent Form  Signed Patient voided / foley secured and emptied When did you last eato 0630 Last dose of injectable or oral agent yesterday Ostomy pouch emptied and vented if applicable NA All implantable devices assessed, documented and approved NA Intravenous access  site secured and place NA Valuables secured Linens and cotton and cotton/polyester blend (less than 51% polyester) Personal oil-based products / skin lotions / body lotions removed Wigs or hairpieces removed NA Smoking or tobacco materials removed NA Books / newspapers / magazines / loose paper removed Cologne, aftershave, perfume and deodorant removed Jewelry removed (may wrap wedding band) Make-up removed NA Hair care products removed Battery operated devices (external) removed Heating patches and chemical warmers removed Titanium eyewear removed Nail polish cured greater than 10 hours NA Casting material cured greater than 10 hours NA Hearing aids removed NA Loose dentures or partials removed dentures removed Prosthetics have been removed NA Patient demonstrates correct use of air break device (if applicable) Patient concerns have been addressed Patient grounding bracelet on and cord attached to chamber Specifics for Inpatients (complete in addition to above) Medication sheet sent with patient NA Intravenous medications needed or due during therapy sent with patient NA Drainage tubes (e.g. nasogastric tube or chest tube secured and vented) NA Endotracheal or Tracheotomy tube secured NA Cuff deflated of air and inflated with saline NA Airway suctioned NA Notes Paper version used prior to treatment. Electronic Signature(s) Signed: 01/19/2023 1:30:57 PM By: Donavan Burnet CHT EMT BS , , Entered By: Donavan Burnet on 01/19/2023 13:30:57

## 2023-01-20 NOTE — Progress Notes (Signed)
MICHA, DOSANJH R ([id number]) [id number] Page 1 of 2 Visit Report for 01/20/2023 Arrival Information Details Patient Name: Date of Service: [place], [name] 01/20/2023 10:00 A M Medical Record Number: [id number] Patient Account Number: [id number] Date of Birth/Sex: Treating RN: [birth year] (67 y.o. [name], Meta.Reding Primary Care Makoa Satz: Nelda Bucks Other Clinician: Valeria Batman Referring Meryl Hubers: Treating Rafeal Skibicki/Extender: Doristine Bosworth in Treatment: 18 Visit Information History Since Last Visit All ordered tests and consults were completed: Yes Patient Arrived: Knee Scooter Added or deleted any medications: No Arrival Time: 08:50 Any new allergies or adverse reactions: No Accompanied By: None Had a fall or experienced change in No Transfer Assistance: None activities of daily living that may affect Patient Identification Verified: Yes risk of falls: Secondary Verification Process Completed: Yes Signs or symptoms of abuse/neglect since last visito No Patient Requires Transmission-Based Precautions: No Hospitalized since last visit: No Patient Has Alerts: No Implantable device outside of the clinic excluding No cellular tissue based products placed in the center since last visit: Pain Present Now: No Electronic Signature(s) Signed: 01/20/2023 1:00:52 PM By: Valeria Batman EMT Entered By: Valeria Batman on 01/20/2023 13:00:52 -------------------------------------------------------------------------------- Encounter Discharge Information Details Patient Name: Date of Service: [place], [name] 01/20/2023 10:00 A M Medical Record Number: [id number] Patient Account Number: [id number] Date of Birth/Sex: Treating RN: 08-05-1956 (67 y.o. [name], Meta.Reding Primary Care Angenette Daily: Nelda Bucks Other Clinician: Valeria Batman Referring Braxton Weisbecker: Treating Braylee Lal/Extender: Doristine Bosworth in Treatment:  18 Encounter Discharge Information Items Discharge Condition: Stable Ambulatory Status: Knee Scooter Discharge Destination: Home Transportation: Private Auto Accompanied By: None Schedule Follow-up Appointment: Yes Clinical Summary of Care: Electronic Signature(s) Signed: 01/20/2023 1:21:03 PM By: Valeria Batman EMT Entered By: Valeria Batman on 01/20/2023 13:21:03 [name] ([id number]) [id number] Page 2 of 2 -------------------------------------------------------------------------------- Vitals Details Patient Name: Date of Service: [place], [name] 01/20/2023 10:00 A M Medical Record Number: [id number] Patient Account Number: [id number] Date of Birth/Sex: Treating RN: 03-Jun-1956 (67 y.o. [name] Primary Care Kyan Giannone: Nelda Bucks Other Clinician: Valeria Batman Referring Indyah Saulnier: Treating Davanee Klinkner/Extender: Doristine Bosworth in Treatment: 18 Vital Signs Time Taken: 08:58 Capillary Blood Glucose (mg/dl): 292 Height (in): 74 Reference Range: 80 - 120 mg / dl Weight (lbs): 245 Body Mass Index (BMI): 31.5 Electronic Signature(s) Signed: 01/20/2023 1:01:07 PM By: Valeria Batman EMT Entered By: Valeria Batman on 01/20/2023 13:01:07

## 2023-01-20 NOTE — Progress Notes (Signed)
LIAHM, GRIVAS R ([id number]) [id number] Page 1 of 2 Visit Report for 01/19/2023 Problem List Details Patient Name: Date of Service: [place], [name] 01/19/2023 10:00 A M Medical Record Number: [id number] Patient Account Number: [id number] Date of Birth/Sex: Treating RN: 10-Nov-1956 (67 y.o. 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 L97.523 Non-pressure chronic ulcer of other part of left foot with 09/10/2022 No Yes necrosis of muscle L98.492 Non-pressure chronic ulcer of skin of other sites with fat 12/17/2022 No Yes layer exposed L72.3 Sebaceous cyst 12/17/2022 No Yes B71.696 Other chronic osteomyelitis, left ankle and foot 09/10/2022 No Yes E11.65 Type 2 diabetes mellitus with hyperglycemia 09/10/2022 No Yes E11.621 Type 2 diabetes mellitus with foot ulcer 09/10/2022 No Yes Inactive Problems Resolved Problems Electronic Signature(s) Signed: 01/19/2023 1:36:08 PM By: Donavan Burnet CHT EMT BS , , Signed: 01/19/2023 5:26:12 PM By: Fredirick Maudlin MD FACS Entered By: Donavan Burnet on 01/19/2023 13:36:08 SuperBill Details -------------------------------------------------------------------------------- [name] ([id number]) [id number] Page 2 of 2 Patient Name: Date of Service: [place], [name] 01/19/2023 Medical Record Number: [id number] Patient Account Number: [id number] Date of Birth/Sex: Treating RN: [birth year] (67 y.o. 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.523 Non-pressure chronic ulcer of other part of left foot with necrosis of muscle L98.492 Non-pressure chronic ulcer of skin of other sites with fat layer exposed L72.3 Sebaceous cyst N36.144 Other chronic osteomyelitis, left ankle and foot E11.65 Type 2 diabetes mellitus with hyperglycemia E11.621 Type 2 diabetes mellitus with foot ulcer Facility Procedures The patient participates with Medicare or their insurance follows the Medicare Facility Guidelines CPT4 Code Description Modifier Quantity [id number]-(Facility Use Only) HBOT full body chamber, 39min , 4 ICD-10 Diagnosis Description [phone] Other chronic osteomyelitis, 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 Physician Procedures Quantity CPT4 Code Description Modifier [id number] [id number] - WC PHYS HYPERBARIC OXYGEN THERAPY 1 ICD-10 Diagnosis Description 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, 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

## 2023-01-20 NOTE — Progress Notes (Signed)
[name], [name] ([id number]) [id number] Page 1 of 2 Visit Report for 01/20/2023 HBO Details Patient Name: Date of Service: [name], [name]. 01/20/2023 10:00 A M Medical Record Number: [id number] Patient Account Number: [id number] Date of Birth/Sex: Treating RN: 1956-02-23 (67 y.o. 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 HBO Treatment Details Treatment Number: 45 Patient Type: Outpatient Chamber Type: Monoplace Chamber Serial #: [id number] Treatment Protocol: 2.5 ATA with 90 minutes oxygen, with two 5 minute air breaks Treatment Details Compression Rate Down: 2.0 psi / minute De-Compression Rate Up: 2.0 psi / minute A breaks and breathing ir Compress Tx Pressure periods Decompress Decompress Begins Reached (leave unused spaces Begins Ends blank) Chamber Pressure (ATA 1 2.5 2.5 2.5 2.5 2.5 - - 2.5 1 ) Clock Time (24 hr) 10:14 10:26 10:56 11:01 11:31 11:36 - - 12:06 12:15 Treatment Length: 121 (minutes) Treatment Segments: 4 Vital Signs Capillary Blood Glucose Reference Range: 80 - 120 mg / dl HBO Diabetic Blood Glucose Intervention Range: <131 mg/dl or >249 mg/dl Time Vitals Blood Respiratory Capillary Blood Glucose Pulse Action Type: Pulse: Temperature: Taken: Pressure: Rate: Glucose (mg/dl): Meter #: Oximetry (%) Taken: Pre 08:58 292 Post 12:22 166/69 72 18 98.9 251 Pre 09:34 108/54 77 18 99 Pre 10:08 287 Treatment Response Treatment Toleration: Well Treatment Completion Status: Treatment Completed without Adverse Event Treatment Notes The patient stated that his blood sugar  was 380 at 0300 this morning. On arrival here his blood sugar was 292. Spoke with Dr. Celine Ahr. she was OK with blood sugar. Blood sugar was rechecked just before treatment it was down to 287. He stated that he did not take any insulin this morning. Physician HBO Attestation: I certify that I supervised this HBO treatment in accordance with Medicare guidelines. A trained emergency response team is readily available per Yes hospital policies and procedures. Continue HBOT as ordered. Yes Electronic Signature(s) Signed: 01/20/2023 1:42:36 PM By: Fredirick Maudlin MD FACS Previous Signature: 01/20/2023 1:18:03 PM Version By: Valeria Batman EMT Entered By: Fredirick Maudlin on 01/20/2023 13:42:36 [name], [name] ([id number]) [id number] Page 2 of 2 -------------------------------------------------------------------------------- HBO Safety Checklist Details Patient Name: Date of Service: [name]. 01/20/2023 10:00 A M Medical Record Number: [id number] Patient Account Number: [id number] Date of Birth/Sex: Treating RN: 1956/04/14 (67 y.o. [name], [name] Primary Care Plez Belton: Nelda Bucks Other Clinician: Valeria Batman Referring Madisynn Plair: Treating Arlean Thies/Extender: Doristine Bosworth in Treatment: 18 HBO Safety Checklist Items Safety Checklist Consent Form Signed Patient voided / foley secured and emptied When did you last eato last night Last dose of injectable or oral agent last night Ostomy pouch emptied and vented if applicable NA All implantable devices assessed, documented and approved NA Dialysis shunt Intravenous access site secured and place NA Valuables secured Linens and cotton and cotton/polyester blend (less than 51% polyester) Personal oil-based products / skin lotions / body lotions removed Wigs or hairpieces removed NA Smoking or tobacco materials removed Books / newspapers / magazines / loose paper removed Cologne,  aftershave, perfume and deodorant removed Jewelry removed (may wrap wedding band) Make-up removed NA Hair care products removed Battery operated devices (external) removed Heating patches and chemical warmers removed Titanium eyewear removed NA Nail polish cured  greater than 10 hours NA Casting material cured greater than 10 hours NA Hearing aids removed NA Loose dentures or partials removed removed by patient Prosthetics have been removed NA Patient demonstrates correct use of air break device (if applicable) Patient concerns have been addressed Patient grounding bracelet on and cord attached to chamber Specifics for Inpatients (complete in addition to above) Medication sheet sent with patient NA Intravenous medications needed or due during therapy sent with patient NA Drainage tubes (e.g. nasogastric tube or chest tube secured and vented) NA Endotracheal or Tracheotomy tube secured NA Cuff deflated of air and inflated with saline NA Airway suctioned NA Notes The safety checklist was done before the treatment started. Electronic Signature(s) Signed: 01/20/2023 1:04:27 PM By: Valeria Batman EMT Entered By: Valeria Batman on 01/20/2023 13:04:27

## 2023-01-21 ENCOUNTER — Encounter (HOSPITAL_BASED_OUTPATIENT_CLINIC_OR_DEPARTMENT_OTHER): Payer: Medicare Other | Admitting: General Surgery

## 2023-01-24 ENCOUNTER — Encounter (HOSPITAL_BASED_OUTPATIENT_CLINIC_OR_DEPARTMENT_OTHER): Payer: Medicare Other | Admitting: General Surgery

## 2023-01-24 ENCOUNTER — Other Ambulatory Visit: Payer: Self-pay | Admitting: General Surgery

## 2023-01-24 ENCOUNTER — Ambulatory Visit: Payer: Medicare Other | Attending: Cardiology

## 2023-01-24 DIAGNOSIS — I739 Peripheral vascular disease, unspecified: Secondary | ICD-10-CM | POA: Diagnosis present

## 2023-01-24 DIAGNOSIS — E11621 Type 2 diabetes mellitus with foot ulcer: Secondary | ICD-10-CM | POA: Diagnosis not present

## 2023-01-24 LAB — VAS US ABI WITH/WO TBI
Left ABI: 0.8
Right ABI: 1.26

## 2023-01-24 LAB — GLUCOSE, CAPILLARY
Glucose-Capillary: 203 mg/dL — ABNORMAL HIGH (ref 70–99)
Glucose-Capillary: 222 mg/dL — ABNORMAL HIGH (ref 70–99)

## 2023-01-24 NOTE — Progress Notes (Signed)
OVIDE, CALANDRA R ([id number]) [id number] Page 1 of 12 Visit Report for 01/24/2023 Arrival Information Details Patient Name: Date of Service: [place], [name] 01/24/2023 8:30 A M Medical Record Number: [id number] Patient Account Number: [id number] Date of Birth/Sex: Treating RN: 1956-01-18 (67 y.o. [name] Primary Care Anis Cinelli: Nelda Bucks Other Clinician: Referring Jazma Pickel: Treating Luciana Cammarata/Extender: Doristine Bosworth in Treatment: 56 Visit Information History Since Last Visit Added or deleted any medications: No Patient Arrived: Knee Scooter Any new allergies or adverse reactions: No Arrival Time: 08:34 Had a fall or experienced change in No Accompanied By: self activities of daily living that may affect Transfer Assistance: Manual risk of falls: Patient Identification Verified: Yes Signs or symptoms of abuse/neglect since last visito No Secondary Verification Process Completed: Yes Hospitalized since last visit: No Patient Requires Transmission-Based Precautions: No Implantable device outside of the clinic excluding No Patient Has Alerts: No cellular tissue based products placed in the center since last visit: Has Dressing in Place as Prescribed: Yes Pain Present Now: No Electronic Signature(s) Signed: 01/24/2023 4:55:37 PM By: Blanche East RN Previous Signature: 01/24/2023 8:56:11 AM Version By: Blanche East RN Entered By: Blanche East on 01/24/2023 09:06:05 -------------------------------------------------------------------------------- Encounter Discharge Information Details Patient Name: Date of Service: [place], [name] 01/24/2023 8:30 A M Medical Record Number: [id number] Patient Account Number: [id number] Date of Birth/Sex: Treating RN: 1956-11-14 (68 y.o. [name] Primary Care Jarvis Knodel: Nelda Bucks Other Clinician: Referring Prabhjot Maddux: Treating Jaelen Gellerman/Extender: Doristine Bosworth in Treatment: 19 Encounter Discharge Information Items Post Procedure Vitals Discharge Condition: Stable Temperature (F): 97.9 Ambulatory Status: Knee Scooter Pulse (bpm): 80 Discharge Destination: Home Respiratory Rate (breaths/min): 16 Transportation: Private Auto Blood Pressure (mmHg): 155/81 Accompanied By: self Schedule Follow-up Appointment: Yes Clinical Summary of Care: Electronic Signature(s) Signed: 01/24/2023 4:55:37 PM By: Blanche East RN Entered By: Blanche East on 01/24/2023 09:04:58 [name] ([id number] Page 2 of 12 -------------------------------------------------------------------------------- Lower Extremity Assessment Details Patient Name: Date of Service: [place], [name] 01/24/2023 8:30 A M Medical Record Number: [id number] Patient Account Number: [id number] Date of Birth/Sex: Treating RN: May 25, 1956 (67 y.o. [name] Primary Care Aislee Landgren: Nelda Bucks Other Clinician: Referring Arieon Corcoran: Treating Vedanshi Massaro/Extender: Doristine Bosworth in Treatment: 19 Edema Assessment Assessed: [name]: No] Patrice [name]: No] Edema: [Left: Ye] [Right: s] Calf Left: Right: Point of Measurement: From Medial Instep 47 cm Ankle Left: Right: Point of Measurement: From Medial Instep 25.5 cm Vascular Assessment Pulses: Dorsalis Pedis Palpable: [Left:Yes] Electronic Signature(s) Signed: 01/24/2023 4:55:37 PM By: Blanche East RN Entered By: Blanche East on 01/24/2023 08:44:18 -------------------------------------------------------------------------------- Multi Wound Chart Details Patient Name: Date of Service: [name], [name] 01/24/2023 8:30 A M Medical Record Number: [id number] Patient Account Number: [id number] Date of Birth/Sex: Treating RN: 11-16-1956 (67 y.o. M) Primary Care Berlie Persky: Nelda Bucks Other Clinician: Referring Daesha Insco: Treating  Leialoha Hanna/Extender: Doristine Bosworth in Treatment: 19 Vital Signs Height(in): 74 Capillary Blood Glucose(mg/dl): 313 Weight(lbs): 245 Pulse(bpm): 80 Body Mass Index(BMI): 31.5 Blood Pressure(mmHg): 155/81 Temperature(F): 97.9 Respiratory Rate(breaths/min): 16 [1:Photos:] [[id number] Page 3 of 12] Left Calcaneus Left, Dorsal Foot Right Shoulder Wound Location: Pressure Injury Pressure Injury Gradually Appeared Wounding Event: Diabetic Wound/Ulcer of the Lower Diabetic Wound/Ulcer of the Lower Cyst Primary Etiology: Extremity Extremity Cataracts, Sleep Apnea, Hypertension, Cataracts, Sleep Apnea, Hypertension, Cataracts, Sleep Apnea, Hypertension, Comorbid History: Type II Diabetes, Osteomyelitis, Type II Diabetes, Osteomyelitis, Type II Diabetes, Osteomyelitis, Neuropathy Neuropathy Neuropathy 03/09/2019 07/09/2022  12/14/2022 Date Acquired: 19 19 5 $ Weeks of Treatment: Open Open Open Wound Status: No No No Wound Recurrence: 6x4.8x0.3 2.2x2.8x0.1 0.3x0.2x0.5 Measurements L x W x D (cm) 22.619 4.838 0.047 A (cm) : rea 6.786 0.484 0.024 Volume (cm) : -4.70% 48.70% 62.70% % Reduction in A rea: 68.60% 48.60% 4.00% % Reduction in Volume: Grade 3 Grade 2 Full Thickness Without Exposed Classification: Support Structures Large Medium Medium Exudate A mount: Serosanguineous Serosanguineous Serosanguineous Exudate Type: red, brown red, brown red, brown Exudate Color: Thickened Flat and Intact Distinct, outline attached Wound Margin: Large (67-100%) Small (1-33%) None Present (0%) Granulation A mount: Red, Pale Red N/A Granulation Quality: Small (1-33%) Large (67-100%) None Present (0%) Necrotic A mount: Fat Layer (Subcutaneous Tissue): Yes Fat Layer (Subcutaneous Tissue): Yes Fat Layer (Subcutaneous Tissue): Yes Exposed Structures: Bone: Yes Fascia: No Fascia: No Fascia: No Tendon: No Tendon: No Tendon:  No Muscle: No Muscle: No Muscle: No Joint: No Joint: No Joint: No Bone: No Bone: No Small (1-33%) Small (1-33%) Small (1-33%) Epithelialization: Debridement - Selective/Open Wound Debridement - Selective/Open Wound N/A Debridement: Pre-procedure Verification/Time Out 09:00 09:00 N/A Taken: Callus Callus, Slough N/A Tissue Debrided: Skin/Epidermis Skin/Epidermis N/A Level: 28.8 6.16 N/A Debridement A (sq cm): rea Curette Curette N/A Instrument: Minimum Minimum N/A Bleeding: Pressure Pressure N/A Hemostasis A chieved: Procedure was tolerated well Procedure was tolerated well N/A Debridement Treatment Response: 6x4.8x0.3 2.2x2.8x0.1 N/A Post Debridement Measurements L x W x D (cm) 6.786 0.484 N/A Post Debridement Volume: (cm) Callus: Yes Scarring: Yes Rash: Yes Periwound Skin Texture: Maceration: Yes No Abnormalities Noted No Abnormalities Noted Periwound Skin Moisture: No Abnormalities Noted No Abnormalities Noted No Abnormalities Noted Periwound Skin Color: No Abnormality No Abnormality No Abnormality Temperature: Debridement Debridement N/A Procedures Performed: Negative Pressure Wound Therapy Maintenance (NPWT) Treatment Notes Wound #1 (Calcaneus) Wound Laterality: Left Cleanser Normal Saline Discharge Instruction: Cleanse the wound with Normal Saline prior to applying a clean dressing using gauze sponges, not tissue or cotton balls. Soap and Water Discharge Instruction: May shower and wash wound with dial antibacterial soap and water prior to dressing change. Peri-Wound Care keystone Topical Primary Dressing VAC Secondary Dressing Secured With Coban Self-Adherent Wrap 4x5 (in/yd) Discharge Instruction: Secure with Coban as directed. Kerlix Roll Sterile, 4.5x3.1 (in/yd) Discharge Instruction: Secure with Kerlix as directed. Compression Wrap Compression Stockings TALAN, GRUSS R ([id number]) [id number] Page 4 of  12 Add-Ons Wound #2 (Foot) Wound Laterality: Dorsal, Left Cleanser Normal Saline Discharge Instruction: Cleanse the wound with Normal Saline prior to applying a clean dressing using gauze sponges, not tissue or cotton balls. Soap and Water Discharge Instruction: May shower and wash wound with dial antibacterial soap and water prior to dressing change. Peri-Wound Care keystone Topical Primary Dressing Promogran Prisma Matrix, 4.34 (sq in) (silver collagen) Discharge Instruction: Moisten collagen with hydrogel Secondary Dressing Zetuvit Plus Silicone Border Dressing 4x4 (in/in) Discharge Instruction: Apply silicone border over primary dressing as directed. Secured With [employer] 4x5 (in/yd) Discharge Instruction: Secure with Coban as directed. Kerlix Roll Sterile, 4.5x3.1 (in/yd) Discharge Instruction: Secure with Kerlix as directed. Compression Wrap Compression Stockings Add-Ons Wound #3 (Shoulder) Wound Laterality: Right Cleanser Soap and Water Discharge Instruction: May shower and wash wound with dial antibacterial soap and water prior to dressing change. Wound Cleanser Discharge Instruction: Cleanse the wound with wound cleanser prior to applying a clean dressing using gauze sponges, not tissue or cotton balls. Peri-Wound Care Skin Prep Discharge Instruction: Use skin prep as directed Topical Primary Dressing Iodoform packing strip  1/4 (in) Discharge Instruction: Lightly pack as instructed Secondary Dressing Zetuvit Plus Silicone Border Dressing 4x4 (in/in) Discharge Instruction: Apply silicone border over primary dressing as directed. Secured With Compression Wrap Compression Stockings Environmental education officer) Signed: 01/24/2023 9:30:50 AM By: Fredirick Maudlin MD FACS Entered By: Fredirick Maudlin on 01/24/2023 09:30:50 [name] ([id number].pdf Page 5 of  12 -------------------------------------------------------------------------------- Multi-Disciplinary Care Plan Details Patient Name: Date of Service: [place], [name] 01/24/2023 8:30 A M Medical Record Number: [id number] Patient Account Number: [id number] Date of Birth/Sex: Treating RN: 07-01-1956 (67 y.o. 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 and treatment procedures Potential for barotraumas to ears, sinuses, teeth, and lungs or cerebral gas embolism related to changes in atmospheric pressure inside hyperbaric oxygen chamber Potential for oxygen toxicity seizures related to delivery of 100% oxygen at an increased atmospheric pressure Potential for pulmonary oxygen toxicity related to delivery of 100% oxygen at an increased atmospheric pressure Goals: Barotrauma will be prevented during HBO2 Date Initiated: 10/06/2022 Target Resolution Date: 01/14/2023 Goal Status: Active Patient and/or family will be able to state/discuss factors appropriate to the management of their disease process during treatment Date Initiated: 10/06/2022 Target Resolution Date: 01/14/2023 Goal Status: Active Patient will tolerate the hyperbaric oxygen therapy treatment Date Initiated: 10/06/2022 Target Resolution Date: 01/14/2023 Goal Status: Active Patient/caregiver will verbalize understanding of HBO goals, rationale, procedures and potential hazards Date Initiated: 10/06/2022 Target Resolution Date: 01/14/2023 Goal Status: Active Interventions: Administer decongestants, per physician orders, prior to HBO2 Administer the correct therapeutic gas delivery based on the patients needs and limitations, per physician  order Assess and provide for patients comfort related to the hyperbaric environment and equalization of middle ear Assess for signs and symptoms related to adverse events, including but not limited to confinement anxiety, pneumothorax, oxygen toxicity and baurotrauma Notes: Wound/Skin Impairment Nursing Diagnoses: Impaired tissue integrity Goals: Patient/caregiver will verbalize understanding of skin care regimen Date Initiated: 10/06/2022 Target Resolution Date: 03/10/2023 Goal Status: Active Ulcer/skin breakdown will have a volume reduction of 30% by week 4 Date Initiated: 09/10/2022 Date Inactivated: 10/06/2022 Target Resolution Date: 10/08/2022 Goal Status: Unmet Unmet Reason: osteo, HBOT Ulcer/skin breakdown will have a volume reduction of 50% by week 8 Date Initiated: 10/06/2022 Target Resolution Date: 01/14/2023 Goal Status: Active Interventions: Assess ulceration(s) every visit Provide education on ulcer and skin care Treatment Activities: Consult for HBO : 09/10/2022 Skin care regimen initiated : 09/10/2022 Notes: Electronic Signature(s) STERLYN, ROMO R ([id number]) [id number] Page 6 of 12 Signed: 01/24/2023 4:55:37 PM By: Blanche East RN Entered By: Blanche East on 01/24/2023 08:52:22 -------------------------------------------------------------------------------- Negative Pressure Wound Therapy Maintenance (NPWT) Details Patient Name: Date of Service: [place], [name] 01/24/2023 8:30 A M Medical Record Number: [id number] Patient Account Number: [id number] Date of Birth/Sex: Treating RN: [birth year] (67 y.o. [name] Primary Care Quinn Quam: Nelda Bucks Other Clinician: Referring Amias Hutchinson: Treating Adriane Guglielmo/Extender: Doristine Bosworth in Treatment: 19 NPWT Maintenance Performed for: Wound #1 Left Calcaneus Additional Injuries Covered: No Performed By: Blanche East, RN Type: VAC System Coverage Size (sq  cm): 28.8 Pressure Type: Constant Pressure Setting: 125 mmHG Drain Type: None Primary Contact: Non-Adherent Sponge/Dressing Type: Foam, Black Date Initiated: 12/27/2022 Dressing Removed: No Quantity of Sponges/Gauze Removed: 1 Canister Changed: No Canister Exudate Volume: 0 Dressing Reapplied: No  Quantity of Sponges/Gauze Inserted: 1 Respones T Treatment: o tolerated well Days On NPWT : 29 Post Procedure Diagnosis Same as Pre-procedure Electronic Signature(s) Signed: 01/24/2023 4:55:37 PM By: Blanche East RN Entered By: Blanche East on 01/24/2023 09:03:50 -------------------------------------------------------------------------------- Pain Assessment Details Patient Name: Date of Service: [place], [name] 01/24/2023 8:30 A M Medical Record Number: [id number] Patient Account Number: [id number] Date of Birth/Sex: Treating RN: 1955/12/19 (67 y.o. [name] Primary Care Shuronda Santino: Nelda Bucks Other Clinician: Referring Xzaviar Maloof: Treating Louretta Tantillo/Extender: Doristine Bosworth in Treatment: 19 Active Problems Location of Pain Severity and Description of Pain Patient Has Paino No Site Locations Rate the pain. MALAKII, HARL R ([id number]) [id number] Page 7 of 12 Rate the pain. Current Pain Level: 0 Pain Management and Medication Current Pain Management: Electronic Signature(s) Signed: 01/24/2023 4:55:37 PM By: Blanche East RN Entered By: Blanche East on 01/24/2023 08:37:46 -------------------------------------------------------------------------------- Patient/Caregiver Education Details Patient Name: Date of Service: [place] NA LD R. 2/12/2024andnbsp8:30 A M Medical Record Number: [id number] Patient Account Number: [id number] Date of Birth/Gender: Treating RN: Apr 25, 1956 (67 y.o. [name] Primary Care Physician: Nelda Bucks Other Clinician: Referring Physician: Treating Physician/Extender: Doristine Bosworth in Treatment: 19 Education Assessment Education Provided To: Patient Education Topics Provided Wound Debridement: Methods: Explain/Verbal Responses: Reinforcements needed, State content correctly Wound/Skin Impairment: Methods: Explain/Verbal Responses: Reinforcements needed, State content correctly Electronic Signature(s) Signed: 01/24/2023 4:55:37 PM By: Blanche East RN Entered By: Blanche East on 01/24/2023 08:52:39 -------------------------------------------------------------------------------- Wound Assessment Details Patient Name: Date of Service: [place], [name] 01/24/2023 8:30 A Koren Bound, Jori Moll R ([id number].pdf Page 8 of 12 Medical Record Number: [id number] Patient Account Number: [id number] Date of Birth/Sex: Treating RN: 1956/04/28 (67 y.o. [name] Primary Care Sascha Palma: Nelda Bucks Other Clinician: Referring Kmari Brian: Treating Wileen Duncanson/Extender: Doristine Bosworth in Treatment: 19 Wound Status Wound Number: 1 Primary Diabetic Wound/Ulcer of the Lower Extremity Etiology: Wound Location: Left Calcaneus Wound Open Wounding Event: Pressure Injury Status: Date Acquired: 03/09/2019 Comorbid Cataracts, Sleep Apnea, Hypertension, Type II Diabetes, Weeks Of Treatment: 19 History: Osteomyelitis, Neuropathy Clustered Wound: No Photos Wound Measurements Length: (cm) 6 Width: (cm) 4.8 Depth: (cm) 0.3 Area: (cm) 22.619 Volume: (cm) 6.786 % Reduction in Area: -4.7% % Reduction in Volume: 68.6% Epithelialization: Small (1-33%) Tunneling: No Undermining: No Wound Description Classification: Grade 3 Wound Margin: Thickened Exudate Amount: Large Exudate Type: Serosanguineous Exudate Color: red, brown Foul Odor After Cleansing: No Slough/Fibrino Yes Wound Bed Granulation Amount: Large (67-100%) Exposed Structure Granulation Quality: Red, Pale Fascia Exposed:  No Necrotic Amount: Small (1-33%) Fat Layer (Subcutaneous Tissue) Exposed: Yes Necrotic Quality: Adherent Slough Tendon Exposed: No Muscle Exposed: No Joint Exposed: No Bone Exposed: Yes Periwound Skin Texture Texture Color No Abnormalities Noted: No No Abnormalities Noted: Yes Callus: Yes Temperature / Pain Temperature: No Abnormality Moisture No Abnormalities Noted: No Maceration: Yes Treatment Notes Wound #1 (Calcaneus) Wound Laterality: Left Cleanser Normal Saline Discharge Instruction: Cleanse the wound with Normal Saline prior to applying a clean dressing using gauze sponges, not tissue or cotton balls. Soap and Water Discharge Instruction: May shower and wash wound with dial antibacterial soap and water prior to dressing change. Peri-Wound Care [address] [place], [place] R ([id number]) [id number] Page 9 of 12 Primary Dressing VAC Secondary Dressing Secured With Coban Self-Adherent Wrap 4x5 (in/yd) Discharge Instruction: Secure with Coban as directed. Kerlix Roll Sterile, 4.5x3.1 (in/yd) Discharge Instruction: Secure with Kerlix as directed. Compression Wrap Compression Stockings Add-Ons Electronic Signature(s)  Signed: 01/24/2023 4:55:37 PM By: Blanche East RN Entered By: Blanche East on 01/24/2023 08:47:10 -------------------------------------------------------------------------------- Wound Assessment Details Patient Name: Date of Service: [place], [name] 01/24/2023 8:30 A M Medical Record Number: [id number] Patient Account Number: [id number] Date of Birth/Sex: Treating RN: 1956-09-13 (67 y.o. [name] Primary Care Hassani Sliney: Nelda Bucks Other Clinician: Referring Shiro Ellerman: Treating Jaskirat Zertuche/Extender: Doristine Bosworth in Treatment: 19 Wound Status Wound Number: 2 Primary Diabetic Wound/Ulcer of the Lower Extremity Etiology: Wound Location: Left, Dorsal Foot Wound Open Wounding Event:  Pressure Injury Status: Date Acquired: 07/09/2022 Comorbid Cataracts, Sleep Apnea, Hypertension, Type II Diabetes, Weeks Of Treatment: 19 History: Osteomyelitis, Neuropathy Clustered Wound: No Photos Wound Measurements Length: (cm) 2.2 Width: (cm) 2.8 Depth: (cm) 0.1 Area: (cm) 4.838 Volume: (cm) 0.484 % Reduction in Area: 48.7% % Reduction in Volume: 48.6% Epithelialization: Small (1-33%) Tunneling: No Undermining: No Wound Description Classification: Grade 2 Wound Margin: Flat and Intact Exudate Amount: Medium Exudate Type: Serosanguineous Exudate Color: red, brown Foul Odor After Cleansing: No Slough/Fibrino Yes Wound Bed LES, GEESLIN ([id number].pdf Page 10 of 12 Granulation Amount: Small (1-33%) Exposed Structure Granulation Quality: Red Fascia Exposed: No Necrotic Amount: Large (67-100%) Fat Layer (Subcutaneous Tissue) Exposed: Yes Necrotic Quality: Adherent Slough Tendon Exposed: No Muscle Exposed: No Joint Exposed: No Bone Exposed: No Periwound Skin Texture Texture Color No Abnormalities Noted: No No Abnormalities Noted: Yes Scarring: Yes Temperature / Pain Temperature: No Abnormality Moisture No Abnormalities Noted: Yes Treatment Notes Wound #2 (Foot) Wound Laterality: Dorsal, Left Cleanser Normal Saline Discharge Instruction: Cleanse the wound with Normal Saline prior to applying a clean dressing using gauze sponges, not tissue or cotton balls. Soap and Water Discharge Instruction: May shower and wash wound with dial antibacterial soap and water prior to dressing change. Peri-Wound Care keystone Topical Primary Dressing Promogran Prisma Matrix, 4.34 (sq in) (silver collagen) Discharge Instruction: Moisten collagen with hydrogel Secondary Dressing Zetuvit Plus Silicone Border Dressing 4x4 (in/in) Discharge Instruction: Apply silicone border over primary dressing as directed. Secured With [employer] 4x5 (in/yd) Discharge Instruction: Secure with Coban as directed. Kerlix Roll Sterile, 4.5x3.1 (in/yd) Discharge Instruction: Secure with Kerlix as directed. Compression Wrap Compression Stockings Add-Ons Electronic Signature(s) Signed: 01/24/2023 4:55:37 PM By: Blanche East RN Entered By: Blanche East on 01/24/2023 08:47:36 -------------------------------------------------------------------------------- Wound Assessment Details Patient Name: Date of Service: [place], [name] 01/24/2023 8:30 A M Medical Record Number: [id number] Patient Account Number: [id number] Date of Birth/Sex: Treating RN: 08/30/1956 (67 y.o. [name] Primary Care Kjuan Seipp: Nelda Bucks Other Clinician: Referring Numa Heatwole: Treating Demarrius Guerrero/Extender: Doristine Bosworth in Treatment: 19 Wound Status Wound Number: 3 Primary Cyst Etiology: Wound Location: Right Shoulder Wound Open Wounding Event: Gradually CIARAN, SCHWARZKOPF ([id number]) [phone].pdf Page 11 of 12 Wounding Event: Gradually Appeared Status: Date Acquired: 12/14/2022 Comorbid Cataracts, Sleep Apnea, Hypertension, Type II Diabetes, Weeks Of Treatment: 5 History: Osteomyelitis, Neuropathy Clustered Wound: No Photos Wound Measurements Length: (cm) 0.3 Width: (cm) 0.2 Depth: (cm) 0.5 Area: (cm) 0.047 Volume: (cm) 0.024 % Reduction in Area: 62.7% % Reduction in Volume: 4% Epithelialization: Small (1-33%) Tunneling: No Undermining: No Wound Description Classification: Full Thickness Without Exposed Support Structures Wound Margin: Distinct, outline attached Exudate Amount: Medium Exudate Type: Serosanguineous Exudate Color: red, brown Foul Odor After Cleansing: No Slough/Fibrino No Wound Bed Granulation Amount: None Present (0%) Exposed Structure Necrotic Amount: None Present (0%) Fascia Exposed: No Fat Layer (Subcutaneous Tissue) Exposed: Yes Tendon Exposed:  No Muscle  Exposed: No Joint Exposed: No Bone Exposed: No Periwound Skin Texture Texture Color No Abnormalities Noted: No No Abnormalities Noted: Yes Rash: Yes Temperature / Pain Temperature: No Abnormality Moisture No Abnormalities Noted: Yes Treatment Notes Wound #3 (Shoulder) Wound Laterality: Right Cleanser Soap and Water Discharge Instruction: May shower and wash wound with dial antibacterial soap and water prior to dressing change. Wound Cleanser Discharge Instruction: Cleanse the wound with wound cleanser prior to applying a clean dressing using gauze sponges, not tissue or cotton balls. Peri-Wound Care Skin Prep Discharge Instruction: Use skin prep as directed Topical Primary Dressing Iodoform packing strip 1/4 (in) Discharge Instruction: Lightly pack as instructed Secondary Dressing Zetuvit Plus Silicone Border Dressing 4x4 (in/in) Discharge Instruction: Apply silicone border over primary dressing as directed. JUDDSON, MASCARENAS R ([id number]) [id number] Page 12 of 12 Secured With Compression Wrap Compression Stockings Environmental education officer) Signed: 01/24/2023 4:55:37 PM By: Blanche East RN Entered By: Blanche East on 01/24/2023 08:49:49 -------------------------------------------------------------------------------- Vitals Details Patient Name: Date of Service: [name], [name] 01/24/2023 8:30 A M Medical Record Number: [id number] Patient Account Number: [id number] Date of Birth/Sex: Treating RN: 1955-12-15 (66 y.o. [name] Primary Care Terrelle Ruffolo: Nelda Bucks Other Clinician: Referring Kenzey Birkland: Treating Heidi Lemay/Extender: Doristine Bosworth in Treatment: 19 Vital Signs Time Taken: 08:36 Temperature (F): 97.9 Height (in): 74 Pulse (bpm): 80 Weight (lbs): 245 Respiratory Rate (breaths/min): 16 Body Mass Index (BMI): 31.5 Blood Pressure (mmHg): 155/81 Capillary Blood Glucose (mg/dl):  313 Reference Range: 80 - 120 mg / dl Electronic Signature(s) Signed: 01/24/2023 4:55:37 PM By: Blanche East RN Entered By: Blanche East on 01/24/2023 08:37:39

## 2023-01-24 NOTE — Progress Notes (Addendum)
YOSBEL, BITTING R ([id number]) [id number] Page 1 of 2 Visit Report for 01/24/2023 Arrival Information Details Patient Name: Date of Service: [place], [name] 01/24/2023 10:00 A M Medical Record Number: [id number] Patient Account Number: [id number] Date of Birth/Sex: Treating RN: 03/05/1956 (67 y.o. [name], Meta.Reding Primary Care Maricarmen Braziel: Nelda Bucks Other Clinician: Donavan Burnet Referring Shemicka Cohrs: Treating Kimiyo Carmicheal/Extender: Doristine Bosworth in Treatment: 27 Visit Information History Since Last Visit All ordered tests and consults were completed: Yes Patient Arrived: Knee Scooter Added or deleted any medications: No Arrival Time: 09:30 Any new allergies or adverse reactions: No Accompanied By: self Had a fall or experienced change in No Transfer Assistance: None activities of daily living that may affect Patient Identification Verified: Yes risk of falls: Secondary Verification Process Completed: Yes Signs or symptoms of abuse/neglect since last visito No Patient Requires Transmission-Based Precautions: No Hospitalized since last visit: No Patient Has Alerts: No Implantable device outside of the clinic excluding No cellular tissue based products placed in the center since last visit: Pain Present Now: No Electronic Signature(s) Signed: 01/24/2023 1:17:43 PM By: Donavan Burnet CHT EMT BS , , Entered By: Donavan Burnet on 01/24/2023 13:17:43 -------------------------------------------------------------------------------- Encounter Discharge Information Details Patient Name: Date of Service: [place], [name] 01/24/2023 10:00 A M Medical Record Number: [id number] Patient Account Number: [id number] Date of Birth/Sex: Treating RN: 1956-10-23 (67 y.o. [name], [name] Primary Care Dana Dorner: Nelda Bucks Other Clinician: Donavan Burnet Referring Gurney Balthazor: Treating Pradyun Ishman/Extender: Doristine Bosworth in Treatment: 19 Encounter Discharge Information Items Discharge Condition: Stable Ambulatory Status: Knee Scooter Discharge Destination: Home Transportation: Private Auto Accompanied By: self Schedule Follow-up Appointment: No Clinical Summary of Care: Electronic Signature(s) Signed: 01/24/2023 2:17:41 PM By: Donavan Burnet CHT EMT BS , , Entered By: Donavan Burnet on 01/24/2023 14:17:40 [name] ([id number]) [phone].pdf Page 2 of 2 -------------------------------------------------------------------------------- Vitals Details Patient Name: Date of Service: [place], [name] 01/24/2023 10:00 A M Medical Record Number: [id number] Patient Account Number: [id number] Date of Birth/Sex: Treating RN: June 29, 1956 (67 y.o. [name], Meta.Reding Primary Care Okema Rollinson: Nelda Bucks Other Clinician: Donavan Burnet Referring Symantha Steeber: Treating Bryce Kimble/Extender: Doristine Bosworth in Treatment: 19 Vital Signs Time Taken: 08:36 Temperature (F): 97.9 Height (in): 74 Pulse (bpm): 80 Weight (lbs): 245 Respiratory Rate (breaths/min): 16 Body Mass Index (BMI): 31.5 Blood Pressure (mmHg): 155/81 Reference Range: 80 - 120 mg / dl Electronic Signature(s) Signed: 01/24/2023 1:18:21 PM By: Donavan Burnet CHT EMT BS , , Entered By: Donavan Burnet on 01/24/2023 13:18:21

## 2023-01-24 NOTE — Progress Notes (Signed)
HARTFORD, SKYBERG R ([id number]) [id number] Page 1 of 1 Visit Report for 01/24/2023 SuperBill Details Patient Name: Date of Service: [name], [name] 01/24/2023 Medical Record Number: [id number] Patient Account Number: [id number] Date of Birth/Sex: Treating RN: [birth year] (67 y.o. 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 L98.492 Non-pressure chronic ulcer of skin of other sites with fat layer exposed L72.3 Sebaceous cyst [id number] Other chronic osteomyelitis, left ankle and foot E11.65 Type 2 diabetes mellitus with hyperglycemia E11.621 Type 2 diabetes mellitus with foot ulcer Facility Procedures The patient participates with Medicare or their insurance follows the Medicare Facility Guidelines CPT4 Code Description Modifier Quantity [id number]-(Facility Use Only) HBOT full body chamber, 78mn , 4 ICD-10 Diagnosis Description [phone] chronic osteomyelitis, 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 Physician Procedures Quantity CPT4 Code Description Modifier [id number] PHYS HYPERBARIC OXYGEN THERAPY 1 ICD-10 Diagnosis Description 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, 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

## 2023-01-24 NOTE — Progress Notes (Addendum)
[name], [name] ([id number]) [id number] Page 1 of 13 Visit Report for 01/24/2023 Chief Complaint Document Details Patient Name: Date of Service: [name], [name]. 01/24/2023 8:30 A M Medical Record Number: [id number] Patient Account Number: [id number] Date of Birth/Sex: Treating RN: [birth year] (67 y.o. M) Primary Care Provider: Nelda Bucks Other Clinician: Referring Provider: Treating Provider/Extender: [name] in Treatment: 19 Information Obtained from: Patient Chief Complaint Patients presents for treatment of an open diabetic ulcer and evaluation for hyperbaric oxygen therapy Electronic Signature(s) Signed: 01/24/2023 9:31:00 AM By: Fredirick Maudlin MD FACS Entered By: Fredirick Maudlin on 01/24/2023 09:31:00 -------------------------------------------------------------------------------- Debridement Details Patient Name: Date of Service: [name], [name] 01/24/2023 8:30 A M Medical Record Number: [id number] Patient Account Number: [id number] Date of Birth/Sex: Treating RN: May 12, 1956 (67 y.o. [name] Primary Care Provider: Nelda Bucks Other Clinician: Referring Provider: Treating Provider/Extender: [name] in Treatment: 19 Debridement Performed for Assessment: Wound #2 Left,Dorsal Foot Performed By: Physician Fredirick Maudlin, MD Debridement Type: Debridement Severity of Tissue Pre Debridement: Fat layer exposed Level of Consciousness (Pre-procedure): Awake and Alert Pre-procedure Verification/Time Out Yes - 09:00 Taken: Start Time: 09:01 T Area Debrided (L x W): otal 2.2 (cm) x 2.8 (cm) = 6.16 (cm) Tissue and other material debrided: Non-Viable, Callus, Slough, Skin: Epidermis, Slough Level: Skin/Epidermis Debridement Description: Selective/Open Wound Instrument: Curette Bleeding: Minimum Hemostasis Achieved: Pressure Response to Treatment: Procedure was  tolerated well Level of Consciousness (Post- Awake and Alert procedure): Post Debridement Measurements of Total Wound Length: (cm) 2.2 Width: (cm) 2.8 Depth: (cm) 0.1 Volume: (cm) 0.484 Character of Wound/Ulcer Post Debridement: Requires Further Debridement Severity of Tissue Post Debridement: Fat layer exposed Post Procedure Diagnosis Same as Pre-procedure Notes Scribed for Dr. Celine Ahr by Blanche East, RN Electronic Signature(s) Signed: 01/24/2023 11:23:53 AM By: Fredirick Maudlin MD FACS Signed: 01/24/2023 4:55:37 PM By: Blanche East RN Entered By: Blanche East on 01/24/2023 [place], [place] ([id number] Page 2 of 13 -------------------------------------------------------------------------------- Debridement Details Patient Name: Date of Service: [name], [name]. 01/24/2023 8:30 A M Medical Record Number: [id number] Patient Account Number: [id number] Date of Birth/Sex: Treating RN: Apr 04, 1956 (67 y.o. [name] Primary Care Provider: Nelda Bucks Other Clinician: Referring Provider: Treating Provider/Extender: [name] in Treatment: 19 Debridement Performed for Assessment: Wound #1 Left Calcaneus Performed By: Physician Fredirick Maudlin, MD Debridement Type: Debridement Severity of Tissue Pre Debridement: Fat layer exposed Level of Consciousness (Pre-procedure): Awake and Alert Pre-procedure Verification/Time Out Yes - 09:00 Taken: Start Time: 09:01 T Area Debrided (L x W): otal 6 (cm) x 4.8 (cm) = 28.8 (cm) Tissue and other material debrided: Non-Viable, Callus, Skin: Epidermis Level: Skin/Epidermis Debridement Description: Selective/Open Wound Instrument: Curette Bleeding: Minimum Hemostasis Achieved: Pressure Response to Treatment: Procedure was tolerated well Level of Consciousness (Post- Awake and Alert procedure): Post Debridement Measurements of Total Wound Length: (cm)  6 Width: (cm) 4.8 Depth: (cm) 0.3 Volume: (cm) 6.786 Character of Wound/Ulcer Post Debridement: Requires Further Debridement Severity of Tissue Post Debridement: Fat layer exposed Post Procedure Diagnosis Same as Pre-procedure Notes Scribed for Dr. Celine Ahr by Blanche East, RN Electronic Signature(s) Signed: 01/24/2023 11:23:53 AM By: Fredirick Maudlin MD FACS Signed: 01/24/2023 4:55:37 PM By: Blanche East RN Entered By: Blanche East on 01/24/2023 09:05:14 -------------------------------------------------------------------------------- HPI Details Patient Name: Date of Service: [name], [name] 01/24/2023 8:30 A M Medical Record Number: [id number] Patient Account Number: [id number] Date of Birth/Sex: Treating RN: 12/31/1955 (66 y.o.  M) Primary Care Provider: Nelda Bucks Other Clinician: Referring Provider: Treating Provider/Extender: [name] in Treatment: 19 History of Present Illness HPI Description: ADMISSION 09/10/2022 This is a 67 year old poorly controlled type II diabetic (last hemoglobin A1c 10.8%) who has had an ulcer on his heel for over 3 years. He has been seen in multiple wound care centers, including [facility] and [facility]. He reports that at least 3 doctors have recommended that he undergo below-knee amputation. He most recently met with Dr. Catalina Gravel, a vascular surgeon affiliated with [facility]. 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 Unfortunately, the patient has some extenuating social circumstances including the fact that he cares for his wife who has stage IV colon cancer and still works, driving vehicles for [employer]. 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 He was recently hospitalized at [facility] for sepsis and currently has a PICC line through which he receives IV antibiotics. He reports  having had another MRI during that hospital stay along with a chest x-ray and EKG. He apparently contacted one of the hyperbaric therapy techs here and asked a number of questions about hyperbaric oxygen treatments. He subsequently self-referred to our center to undergo further evaluation and management. I mention to him that [facility] actually has hyperbaric chambers, but he states that he lives in [place] and this would be more convenient for him given the intensive nature of the therapy and time requirement. ABI in clinic today was 0.94. [name], [name] ([id number]) [id number] Page 3 of 13 The patient actually has 2 wounds. There is a wound on the dorsum of his left foot with heavy black eschar and slough present. After debridement, this was demonstrated to involve the muscle and the extensor tendons are exposed. On his heel, there is essentially a "shark bite" type wound, with much of the heel fat pad absent. The muscle layer is exposed. There is blue-green staining around the perimeter of the wound, but no significant odor. He says he has been applying collagen to the wound on his heel and Silvadene and Betadine to the wound on his dorsal foot. 09/20/2022: The heel wound is quite macerated with wet periwound callus. There is slough accumulation on the surface. The dorsal foot wound looks better this week. There is still exposed tendon, but it is fairly clean with just a little biofilm buildup. We are still working on gathering the required documentation to submit for pretreatment review for hyperbaric oxygen therapy. 09/29/2022: The dorsal foot wound continues to improve. I do not see any exposed tendon at this point. There is just some slough accumulation on the wound surface. He continues to have very wet macerated periwound callus on his heel. There is slough on the surface, but it is loose and thin. There is an area of undermining at the 11 o'clock position, but  the overlying skin and subcutaneous tissue is healthy and viable. He has been approved for hyperbaric oxygen therapy and will start treatment tomorrow. 10/06/2022: Continued contraction and improvement of the dorsal foot wound. There is just a little bit of slough accumulation on the wound surface. The periwound callus continues to accumulate on the heel wound and it is quite macerated. It is also persistently found with blue-green staining present. He did initiate his hyperbaric oxygen therapy, but has had significant difficulty with decompression. He will be going to ENT to have PE  tubes placed. 10/18/2022: His hyperbaric oxygen therapy is on hold while his otological issues are being addressed. He came in again today with the periwound skin on both the dorsal aspect of his foot and his calcaneus completely macerated. The blue-green discoloration, however, has abated and the undermining on the calcaneus has improved. The dorsal foot wound has also contracted somewhat. 10/26/2022: The dorsal foot wound continues to contract and fill with good granulation tissue. The heel, once again, has a rim of macerated callus, but the undermining and tunneling continues to contract. He has completed his oral antibiotics and has been using the [facility] topical compounded antibiotic for his dressing changes at home. He is scheduled to see ENT this afternoon. 11/08/2022: The dorsal foot wound is flush with the surrounding skin and has a good granulation tissue surface. There is some slough accumulation. As usual, the heel has a rim of macerated callus but the dimensions are smaller and the tunneling and undermining have contracted further. He has resumed his hyperbaric oxygen therapy. 12/12; patient seen for wound evaluation. He is tolerating HBO although we could not dive him yesterday because of relative hypoglycemia and the fact he had given himself NovoLog insulin before he came to clinic. Today his blood sugar  is in the 180 range she should be fine. He has a large wound on the plantar calcaneus on the right and a more superficial area on the dorsal foot. He is using a scooter for offloading 11/30/2022: The dorsal foot wound continues to contract. There is some slough on the wound surface. The large calcaneus wound has heaped up wet callus around the margins. Continued undermining. 12/08/2022: The dorsal foot wound is flat and flush with the surrounding skin surface. There is some slough present. Once again, the calcaneal wound has thick, absolutely macerated callus hanging off in tatters around the edges. The patient cannot explain to me why this part of his wound gets so wet. There is slough on the surface. The undermined portion of the wound has filled in. 12/17/2022: Earlier this week, he presented for his hyperbaric oxygen therapy and was hypotensive and ill-appearing. He also had what appeared to be an infected sebaceous cyst on his right upper arm. He was sent to the emergency department. He was given a fluid bolus and the ED provider lanced the cyst. He was prescribed doxycycline. He seems to be feeling better today. He notes that he has had copious drainage from his foot. On further questioning, he states that he has not been taking his oral diuretic. The dorsal foot wound has expanded somewhat, but is actually more superficial. The plantar heel wound is about the same. 12/27/2022: The dorsal foot wound has epithelialized further. The plantar foot wound is about the same size but has heaped up macerated callus around the perimeter. The intake nurse noted a tiny fragment of bone on his dressings and there is now an area at about the 5 o'clock position where 1 can palpate bone through a small slit in the soft tissues. The wound on his arm is contracting but he still likely has cyst wall present due to the manner in which the infected sebaceous cyst was addressed in the ER. 01/01/2023: The wound on his  shoulder has contracted considerably. The dorsal foot wound has some slough on the surface but also looks to be improving. The plantar foot wound is basically unchanged. 01/10/2023: The wound on his shoulder continues to diminish in size and depth. The dorsal foot wound is  improving with just a light layer of slough on the surface. The plantar foot wound appears to be contracting but still has thick wet callus around the perimeter. We did in chamber T cPO2 monitoring during his last hyperbaric oxygen therapy. The results show that he has extremely poor tissue perfusion at room air and 1 atm of pressure. He does respond extremely well to hyperbaric oxygen therapy, however. 01/17/2023: The wound on his shoulder is down to 0.6 cm in depth, down from 1 cm last week. The dorsal foot wound is quite a bit smaller this week with just some light slough on the surface. The plantar foot wound is also smaller and the area of bone that was exposed at the posterior heel is now covered. There is still substantial wet callus around the perimeter. He is scheduled to undergo formal vascular studies sometime next week. 01/24/2023: The wound on his shoulder has almost completely filled in, with just a little bit of depth remaining. The dorsal foot wound got macerated; it appears that his home health nurses are applying the drape for his heel wound over the top of this, meaning that the dorsal foot wound is not getting changed as regularly as it is supposed to. There is some slough on the surface. The plantar foot wound has thick wet callus around the perimeter. The dimensions measure a little bit smaller. He is having his vascular studies done this afternoon. Electronic Signature(s) Signed: 01/24/2023 9:32:51 AM By: Fredirick Maudlin MD FACS Entered By: Fredirick Maudlin on 01/24/2023 09:32:51 -------------------------------------------------------------------------------- Physical Exam Details Patient Name: Date of  Service: [name], [name] 01/24/2023 8:30 A M Medical Record Number: [id number] Patient Account Number: [id number] Date of Birth/Sex: Treating RN: 1956-02-22 (67 y.o. M) Primary Care Provider: Nelda Bucks Other Clinician: Referring Provider: Treating Provider/Extender: [name] in Treatment: [address] [place] ([id number]) [id number] Page 4 of 13 Constitutional Hypertensive, asymptomatic. . . . no acute distress. Respiratory Normal work of breathing on room air. Notes 01/24/2023: The wound on his shoulder has almost completely filled in, with just a little bit of depth remaining. The dorsal foot wound got macerated; there is some slough on the surface. The plantar foot wound has thick wet callus around the perimeter. The dimensions measure a little bit smaller. Electronic Signature(s) Signed: 01/24/2023 9:33:45 AM By: Fredirick Maudlin MD FACS Entered By: Fredirick Maudlin on 01/24/2023 09:33:45 -------------------------------------------------------------------------------- Physician Orders Details Patient Name: Date of Service: [place], [name] 01/24/2023 8:30 A M Medical Record Number: [id number] Patient Account Number: [id number] Date of Birth/Sex: Treating RN: [birth year] (67 y.o. 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 - Dr. Celine Ahr rm 4 Return A Cellular or Tissue Based Products Other Cellular or Tissue Based Products Orders/Instructions: - run insurance for Apligraf Bathing/ Shower/ Hygiene May shower with protection but do not get wound dressing(s) wet. Protect dressing(s) with water repellant cover (for example, large  plastic bag) or a cast cover and may then take shower. Negative Presssure Wound Therapy Wound Vac to wound continuously at 152m/hg pressure - to left calcaneus Black Foam Edema Control - Lymphedema / SCD / Other Left Lower Extremity Avoid standing for long periods of time. Off-Loading Wound #1 Left Calcaneus Other: - use knee scooter to ambulate, minimal weight bearing right foot Home Health Dressing changes to be completed by [place] Monday / Wednesday / Friday except [facility] Other Home Health Orders/Instructions: - [facility] Hyperbaric Oxygen Therapy Wound #1 Left Calcaneus Evaluate for HBO Therapy - Continue HBOT for additional 20 treatments for total of 60 treatments Indication: - chronic refractory osteomyelitis If appropriate for treatment, begin HBOT per protocol: 2.5 ATA for 90 Minutes with 2 Five (5) Minute A Breaks ir Total Number of Treatments: - 60 One treatments per day (delivered Monday through Friday unless otherwise specified in Special Instructions below): Finger stick Blood Glucose Pre- and Post- HBOT Treatment. [name], [name] ([id number] Page 5 of 13 Follow Hyperbaric Oxygen Glycemia Protocol Afrin (Oxymetazoline HCL) 0.05% nasal spray - 1 spray in both nostrils daily as needed prior to HBO treatment for difficulty clearing ears Wound Treatment Wound #1 - Calcaneus Wound Laterality: Left Cleanser: Normal Saline 3 x Per Week/30 Days Discharge Instructions: Cleanse the wound with  Normal Saline prior to applying a clean dressing using gauze sponges, not tissue or cotton balls. Cleanser: Soap and Water 3 x Per Week/30 Days Discharge Instructions: May shower and wash wound with dial antibacterial soap and water prior to dressing change. Peri-Wound Care: keystone 3 x Per Week/30 Days Prim Dressing: VAC ary 3 x Per Week/30 Days Secured With: Coban Self-Adherent Wrap 4x5 (in/yd) 3 x Per Week/30 Days Discharge Instructions: Secure with Coban as directed. Secured With: [employer] 4.5x3.1 (in/yd) 3 x Per Week/30 Days Discharge Instructions: Secure with Kerlix as directed. Wound #2 - Foot Wound Laterality: Dorsal, Left Cleanser: Normal Saline Discharge Instructions: Cleanse the wound with Normal Saline prior to applying a clean dressing using gauze sponges, not tissue or cotton balls. Cleanser: Soap and Water Discharge Instructions: May shower and wash wound with dial antibacterial soap and water prior to dressing change. Peri-Wound Care: keystone Prim Dressing: Promogran Prisma Matrix, 4.34 (sq in) (silver collagen) ary Discharge Instructions: Moisten collagen with hydrogel Secondary Dressing: Zetuvit Plus Silicone Border Dressing 4x4 (in/in) Discharge Instructions: Apply silicone border over primary dressing as directed. Secured With: Coban Self-Adherent Wrap 4x5 (in/yd) Discharge Instructions: Secure with Coban as directed. Secured With: [employer] 4.5x3.1 (in/yd) Discharge Instructions: Secure with Kerlix as directed. Wound #3 - Shoulder Wound Laterality: Right Cleanser: Soap and Water 1 x Per Day/30 Days Discharge Instructions: May shower and wash wound with dial antibacterial soap and water prior to dressing change. Cleanser: Wound Cleanser 1 x Per Day/30 Days Discharge Instructions: Cleanse the wound with wound cleanser prior to applying a clean dressing using gauze sponges, not tissue or cotton balls. Peri-Wound Care: Skin Prep 1 x Per Day/30  Days Discharge Instructions: Use skin prep as directed Prim Dressing: Iodoform packing strip 1/4 (in) 1 x Per Day/30 Days ary Discharge Instructions: Lightly pack as instructed Secondary Dressing: Zetuvit Plus Silicone Border Dressing 4x4 (in/in) 1 x Per Day/30 Days Discharge Instructions: Apply silicone border over primary dressing as directed. Consults Vascular Surgery - referral for abnormal ABI and nonhealing wounds to left leg - (ICD10 L97.523 - Non-pressure chronic ulcer of other  part of left foot with necrosis of muscle) GLYCEMIA INTERVENTIONS PROTOCOL PRE-HBO GLYCEMIA INTERVENTIONS ACTION INTERVENTION Obtain pre-HBO capillary blood glucose (ensure 1 physician order is in chart). A. Notify HBO physician and await physician orders. 2 If result is 70 mg/dl or below: B. If the result meets the hospital definition of a critical result, follow hospital policy. A. Give patient an 8 ounce Glucerna Shake, an 8 ounce Ensure, or 8 ounces of a Glucerna/Ensure equivalent dietary supplement*. [name], [name] ([id number]) [id number] Page 6 of 13 B. Wait 30 minutes. If result is 71 mg/dl to 130 mg/dl: C. Retest patients capillary blood glucose (CBG). D. If result greater than or equal to 110 mg/dl, proceed with HBO. If result less than 110 mg/dl, notify HBO physician and consider holding HBO. If result is 131 mg/dl to 249 mg/dl: A. Proceed with HBO. A. Notify HBO physician and await physician orders. B. It is recommended to hold HBO and do If result is 250 mg/dl or greater: blood/urine ketone testing. C. If the result meets the hospital definition of a critical result, follow hospital policy. POST-HBO GLYCEMIA INTERVENTIONS ACTION INTERVENTION Obtain post HBO capillary blood glucose (ensure 1 physician order is in chart). A. Notify HBO physician and await physician orders. 2 If result is 70 mg/dl or below: B. If the result meets the hospital  definition of a critical result, follow hospital policy. A. Give patient an 8 ounce Glucerna Shake, an 8 ounce Ensure, or 8 ounces of a Glucerna/Ensure equivalent dietary supplement*. B. Wait 15 minutes for symptoms of If result is 71 mg/dl to 100 mg/dl: hypoglycemia (i.e. nervousness, anxiety, sweating, chills, clamminess, irritability, confusion, tachycardia or dizziness). C. If patient asymptomatic, discharge patient. If patient symptomatic, repeat capillary blood glucose (CBG) and notify HBO physician. If result is 101 mg/dl to 249 mg/dl: A. Discharge patient. A. Notify HBO physician and await physician orders. B. It is recommended to do blood/urine ketone If result is 250 mg/dl or greater: testing. C. If the result meets the hospital definition of a critical result, follow hospital policy. *Juice or candies are NOT equivalent products. If patient refuses the Glucerna or Ensure, please consult the hospital dietitian for an appropriate substitute. Electronic Signature(s) Signed: 01/25/2023 3:32:39 PM By: Adline Peals Signed: 01/25/2023 3:48:13 PM By: Fredirick Maudlin MD FACS Previous Signature: 01/24/2023 11:23:53 AM Version By: Fredirick Maudlin MD FACS Entered By: Adline Peals on 01/25/2023 14:31:21 -------------------------------------------------------------------------------- Problem List Details Patient Name: Date of Service: [place], [name] 01/24/2023 8:30 A M Medical Record Number: [id number] Patient Account Number: [id number] Date of Birth/Sex: Treating RN: [birth year] (67 y.o. 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 09/10/2022 No Yes L97.523 Non-pressure chronic ulcer of  other part of left foot with necrosis of muscle 09/10/2022 No Yes L98.492 Non-pressure chronic ulcer of skin of other sites with fat layer exposed 12/17/2022 No Yes L72.3 Sebaceous cyst 12/17/2022 No Yes [name], [name] ([id number]) [phone].pdf Page 7 of [phone] Other chronic osteomyelitis, left ankle and foot 09/10/2022 No Yes E11.65 Type 2 diabetes mellitus with hyperglycemia 09/10/2022 No Yes E11.621 Type 2 diabetes mellitus with foot ulcer 09/10/2022 No Yes Inactive Problems Resolved Problems Electronic Signature(s) Signed: 01/24/2023 9:30:33 AM By: Fredirick Maudlin MD FACS Entered By: Fredirick Maudlin on 01/24/2023 09:30:32 -------------------------------------------------------------------------------- Progress Note Details  Patient Name: Date of Service: [place], [name]. 01/24/2023 8:30 A M Medical Record Number: [id number] Patient Account Number: [id number] Date of Birth/Sex: Treating RN: July 01, 1956 (67 y.o. M) Primary Care Provider: Nelda Bucks Other Clinician: Referring Provider: Treating Provider/Extender: [name] in Treatment: 19 Subjective Chief Complaint Information obtained from Patient Patients presents for treatment of an open diabetic ulcer and evaluation for hyperbaric oxygen therapy History of Present Illness (HPI) ADMISSION 09/10/2022 This is a 67 year old poorly controlled type II diabetic (last hemoglobin A1c 10.8%) who has had an ulcer on his heel for over 3 years. He has been seen in multiple wound care centers, including [facility] and [facility]. He reports that at least 3 doctors have recommended that he undergo below-knee amputation. He most recently met with Dr. Catalina Gravel, a vascular surgeon affiliated with [facility]. 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 Unfortunately, the patient has some extenuating  social circumstances including the fact that he cares for his wife who has stage IV colon cancer and still works, driving vehicles for [employer]. 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 He was recently hospitalized at [facility] for sepsis and currently has a PICC line through which he receives IV antibiotics. He reports having had another MRI during that hospital stay along with a chest x-ray and EKG. He apparently contacted one of the hyperbaric therapy techs here and asked a number of questions about hyperbaric oxygen treatments. He subsequently self-referred to our center to undergo further evaluation and management. I mention to him that [facility] actually has hyperbaric chambers, but he states that he lives in [place] and this would be more convenient for him given the intensive nature of the therapy and time requirement. ABI in clinic today was 0.94. The patient actually has 2 wounds. There is a wound on the dorsum of his left foot with heavy black eschar and slough present. After debridement, this was demonstrated to involve the muscle and the extensor tendons are exposed. On his heel, there is essentially a "shark bite" type wound, with much of the heel fat pad absent. The muscle layer is exposed. There is blue-green staining around the perimeter of the wound, but no significant odor. He says he has been applying collagen to the wound on his heel and Silvadene and Betadine to the wound on his dorsal foot. 09/20/2022: The heel wound is quite macerated with wet periwound callus. There is slough accumulation on the surface. The dorsal foot wound looks better this week. There is still exposed tendon, but it is fairly clean with just a little biofilm buildup. We are still working on gathering the required documentation to submit for pretreatment review for hyperbaric oxygen therapy. 09/29/2022: The dorsal foot wound continues to improve. I do not see any  exposed tendon at this point. There is just some slough accumulation on the wound surface. He continues to have very wet macerated periwound callus on his heel. There is slough on the surface, but it is loose and thin. There is an area of undermining at the 11 o'clock position, but the overlying skin and subcutaneous tissue is healthy and viable. He has been approved for hyperbaric oxygen therapy and will start treatment tomorrow. 10/06/2022: Continued contraction and improvement of the dorsal foot wound. There is just a little bit of slough accumulation on the wound surface. The periwound callus continues to  accumulate on the heel wound and it is quite macerated. It is also persistently found with blue-green staining present. He did initiate his hyperbaric oxygen therapy, but has had significant difficulty with decompression. He will be going to ENT to have PE tubes placed. 10/18/2022: His hyperbaric oxygen therapy is on hold while his otological issues are being addressed. He came in again today with the periwound skin on both the dorsal aspect of his foot and his calcaneus completely macerated. The blue-green discoloration, however, has abated and the undermining on the calcaneus has improved. The dorsal foot wound has also contracted somewhat. 10/26/2022: The dorsal foot wound continues to contract and fill with good granulation tissue. The heel, once again, has a rim of macerated callus, but the undermining and tunneling continues to contract. He has completed his oral antibiotics and has been using the [facility] topical compounded antibiotic for his dressing changes at home. He is scheduled to see ENT this afternoon. 11/08/2022: The dorsal foot wound is flush with the surrounding skin and has a good granulation tissue surface. There is some slough accumulation. As usual, [name], [name] ([id number]) [phone].pdf Page 8 of 13 the heel has a rim of macerated callus but the  dimensions are smaller and the tunneling and undermining have contracted further. He has resumed his hyperbaric oxygen therapy. 12/12; patient seen for wound evaluation. He is tolerating HBO although we could not dive him yesterday because of relative hypoglycemia and the fact he had given himself NovoLog insulin before he came to clinic. Today his blood sugar is in the 180 range she should be fine. He has a large wound on the plantar calcaneus on the right and a more superficial area on the dorsal foot. He is using a scooter for offloading 11/30/2022: The dorsal foot wound continues to contract. There is some slough on the wound surface. The large calcaneus wound has heaped up wet callus around the margins. Continued undermining. 12/08/2022: The dorsal foot wound is flat and flush with the surrounding skin surface. There is some slough present. Once again, the calcaneal wound has thick, absolutely macerated callus hanging off in tatters around the edges. The patient cannot explain to me why this part of his wound gets so wet. There is slough on the surface. The undermined portion of the wound has filled in. 12/17/2022: Earlier this week, he presented for his hyperbaric oxygen therapy and was hypotensive and ill-appearing. He also had what appeared to be an infected sebaceous cyst on his right upper arm. He was sent to the emergency department. He was given a fluid bolus and the ED provider lanced the cyst. He was prescribed doxycycline. He seems to be feeling better today. He notes that he has had copious drainage from his foot. On further questioning, he states that he has not been taking his oral diuretic. The dorsal foot wound has expanded somewhat, but is actually more superficial. The plantar heel wound is about the same. 12/27/2022: The dorsal foot wound has epithelialized further. The plantar foot wound is about the same size but has heaped up macerated callus around the perimeter. The intake  nurse noted a tiny fragment of bone on his dressings and there is now an area at about the 5 o'clock position where 1 can palpate bone through a small slit in the soft tissues. The wound on his arm is contracting but he still likely has cyst wall present due to the manner in which the infected sebaceous cyst was addressed in the  ER. 01/01/2023: The wound on his shoulder has contracted considerably. The dorsal foot wound has some slough on the surface but also looks to be improving. The plantar foot wound is basically unchanged. 01/10/2023: The wound on his shoulder continues to diminish in size and depth. The dorsal foot wound is improving with just a light layer of slough on the surface. The plantar foot wound appears to be contracting but still has thick wet callus around the perimeter. We did in chamber T cPO2 monitoring during his last hyperbaric oxygen therapy. The results show that he has extremely poor tissue perfusion at room air and 1 atm of pressure. He does respond extremely well to hyperbaric oxygen therapy, however. 01/17/2023: The wound on his shoulder is down to 0.6 cm in depth, down from 1 cm last week. The dorsal foot wound is quite a bit smaller this week with just some light slough on the surface. The plantar foot wound is also smaller and the area of bone that was exposed at the posterior heel is now covered. There is still substantial wet callus around the perimeter. He is scheduled to undergo formal vascular studies sometime next week. 01/24/2023: The wound on his shoulder has almost completely filled in, with just a little bit of depth remaining. The dorsal foot wound got macerated; it appears that his home health nurses are applying the drape for his heel wound over the top of this, meaning that the dorsal foot wound is not getting changed as regularly as it is supposed to. There is some slough on the surface. The plantar foot wound has thick wet callus around the perimeter. The  dimensions measure a little bit smaller. He is having his vascular studies done this afternoon. Patient History Family History Cancer - Father,Mother, Diabetes - Mother,Father, Lung Disease - Father, Thyroid Problems - Mother, No family history of Heart Disease, Hereditary Spherocytosis, Hypertension, Kidney Disease, Seizures, Stroke, Tuberculosis. Social History Never smoker, Marital Status - Married, Alcohol Use - Never, Drug Use - No History, Caffeine Use - Daily. Medical History Eyes Patient has history of Cataracts Denies history of Glaucoma, Optic Neuritis Ear/Nose/Mouth/Throat Denies history of Chronic sinus problems/congestion, Middle ear problems Respiratory Patient has history of Sleep Apnea Cardiovascular Patient has history of Hypertension Gastrointestinal Denies history of Cirrhosis , Colitis, Crohnoos, Hepatitis A, Hepatitis B, Hepatitis C Endocrine Patient has history of Type II Diabetes Immunological Denies history of Lupus Erythematosus, Raynaudoos, Scleroderma Musculoskeletal Patient has history of Osteomyelitis - 2023 Neurologic Patient has history of Neuropathy - Bila lower extremities Oncologic Denies history of Received Chemotherapy, Received Radiation Psychiatric Denies history of Anorexia/bulimia, Confinement Anxiety Hospitalization/Surgery History - I and D Left calcaneus. - back surgery- laminectomy. - eye surgery- Bila cataracts. - shoulder arthroscopy. Medical A Surgical History Notes nd Cardiovascular hyperlipidemia Genitourinary AKI [name], [name] ([id number]) [phone].pdf Page 9 of 13 Objective Constitutional Hypertensive, asymptomatic. no acute distress. Vitals Time Taken: 8:36 AM, Height: 74 in, Weight: 245 lbs, BMI: 31.5, Temperature: 97.9 F, Pulse: 80 bpm, Respiratory Rate: 16 breaths/min, Blood Pressure: 155/81 mmHg, Capillary Blood Glucose: 313 mg/dl. Respiratory Normal work of breathing on room  air. General Notes: 01/24/2023: The wound on his shoulder has almost completely filled in, with just a little bit of depth remaining. The dorsal foot wound got macerated; there is some slough on the surface. The plantar foot wound has thick wet callus around the perimeter. The dimensions measure a little bit smaller. Integumentary (Hair, Skin) Wound #1 status is Open. Original cause of  wound was Pressure Injury. The date acquired was: 03/09/2019. The wound has been in treatment 19 weeks. The wound is located on the Left Calcaneus. The wound measures 6cm length x 4.8cm width x 0.3cm depth; 22.619cm^2 area and 6.786cm^3 volume. There is bone and Fat Layer (Subcutaneous Tissue) exposed. There is no tunneling or undermining noted. There is a large amount of serosanguineous drainage noted. The wound margin is thickened. There is large (67-100%) red, pale granulation within the wound bed. There is a small (1-33%) amount of necrotic tissue within the wound bed including Adherent Slough. The periwound skin appearance had no abnormalities noted for color. The periwound skin appearance exhibited: Callus, Maceration. Periwound temperature was noted as No Abnormality. Wound #2 status is Open. Original cause of wound was Pressure Injury. The date acquired was: 07/09/2022. The wound has been in treatment 19 weeks. The wound is located on the Left,Dorsal Foot. The wound measures 2.2cm length x 2.8cm width x 0.1cm depth; 4.838cm^2 area and 0.484cm^3 volume. There is Fat Layer (Subcutaneous Tissue) exposed. There is no tunneling or undermining noted. There is a medium amount of serosanguineous drainage noted. The wound margin is flat and intact. There is small (1-33%) red granulation within the wound bed. There is a large (67-100%) amount of necrotic tissue within the wound bed including Adherent Slough. The periwound skin appearance had no abnormalities noted for moisture. The periwound skin appearance had  no abnormalities noted for color. The periwound skin appearance exhibited: Scarring. Periwound temperature was noted as No Abnormality. Wound #3 status is Open. Original cause of wound was Gradually Appeared. The date acquired was: 12/14/2022. The wound has been in treatment 5 weeks. The wound is located on the Right Shoulder. The wound measures 0.3cm length x 0.2cm width x 0.5cm depth; 0.047cm^2 area and 0.024cm^3 volume. There is Fat Layer (Subcutaneous Tissue) exposed. There is no tunneling or undermining noted. There is a medium amount of serosanguineous drainage noted. The wound margin is distinct with the outline attached to the wound base. There is no granulation within the wound bed. There is no necrotic tissue within the wound bed. The periwound skin appearance had no abnormalities noted for moisture. The periwound skin appearance had no abnormalities noted for color. The periwound skin appearance exhibited: Rash. Periwound temperature was noted as No Abnormality. 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 There was a Selective/Open Wound Skin/Epidermis Debridement with a total area of 28.8 sq cm performed by Fredirick Maudlin, MD. With the following instrument(s): Curette to remove Non-Viable tissue/material. Material removed includes Callus and Skin: Epidermis and. No specimens were taken. A time out was conducted at 09:00, prior to the  start of the procedure. A Minimum amount of bleeding was controlled with Pressure. The procedure was tolerated well. Post Debridement Measurements: 6cm length x 4.8cm width x 0.3cm depth; 6.786cm^3 volume. Character of Wound/Ulcer Post Debridement requires further debridement. Severity of Tissue Post Debridement is: Fat layer exposed. Post procedure Diagnosis Wound #1: Same as Pre-Procedure General Notes: Scribed  for Dr. Celine Ahr by Blanche East, RN. 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 There was a Selective/Open Wound Skin/Epidermis Debridement with a total area of 6.16 sq cm performed by Fredirick Maudlin, MD. With the following instrument(s): Curette to remove Non-Viable tissue/material. Material removed includes Callus, Slough, and Skin: Epidermis. No specimens were taken. A time out was conducted at 09:00, prior to the start of the procedure. A Minimum amount of bleeding was controlled with Pressure. The procedure was tolerated well. Post Debridement Measurements: 2.2cm length x 2.8cm width x 0.1cm depth; 0.484cm^3 volume. Character of Wound/Ulcer Post Debridement requires further debridement. Severity of Tissue Post Debridement is: Fat layer exposed. Post procedure Diagnosis Wound #2: Same as Pre-Procedure General Notes: Scribed for Dr. Celine Ahr by Blanche East, RN. [address] [name], [name] ([id number]) [id number] Page 10 of 13 Follow-up Appointments: Return Appointment in 1 week. - Dr. Celine Ahr rm 4 Cellular or Tissue Based Products: Other Cellular or Tissue Based Products Orders/Instructions: - run insurance for Apligraf Bathing/ Shower/ Hygiene: May shower with protection but do not get wound dressing(s) wet. Protect dressing(s) with water repellant cover (for example, large plastic bag) or a cast cover and may then take  shower. Negative Presssure Wound Therapy: Wound Vac to wound continuously at 187m/hg pressure - to left calcaneus Black Foam Edema Control - Lymphedema / SCD / Other: Avoid standing for long periods of time. Off-Loading: Wound #1 Left Calcaneus: Other: - use knee scooter to ambulate, minimal weight bearing right foot Home Health: Dressing changes to be completed by [facility] / Wednesday / Friday except when [facility] Other Home Health Orders/Instructions: - [facility] Hyperbaric Oxygen Therapy: Wound #1 Left Calcaneus: Evaluate for HBO Therapy - Continue HBOT for additional 20 treatments for total of 60 treatments Indication: - chronic refractory osteomyelitis If appropriate for treatment, begin HBOT per protocol: 2.5 ATA for 90 Minutes with 2 Five (5) Minute Air Breaks T Number of Treatments: - 60 otal One treatments per day (delivered Monday through Friday unless otherwise specified in Special Instructions below): Finger stick Blood Glucose Pre- and Post- HBOT Treatment. Follow Hyperbaric Oxygen Glycemia Protocol Afrin (Oxymetazoline HCL) 0.05% nasal spray - 1 spray in both nostrils daily as needed prior to HBO treatment for difficulty clearing ears Consults ordered were: Vascular Surgery - referral for abnormal ABI and nonhealing wounds to left leg WOUND #1: - Calcaneus Wound Laterality: Left Cleanser: Normal Saline 3 x Per Week/30 Days Discharge Instructions: Cleanse the wound with Normal Saline prior to applying a clean dressing using gauze sponges, not tissue or cotton balls. Cleanser: Soap and Water 3 x Per Week/30 Days Discharge Instructions: May shower and wash wound with dial antibacterial soap and water prior to dressing change. Peri-Wound Care: keystone 3 x Per Week/30 Days Prim Dressing: VAC 3 x Per Week/30 Days ary Secured With: Coban Self-Adherent Wrap 4x5 (in/yd) 3 x Per Week/30 Days Discharge Instructions: Secure  with Coban as directed. Secured With: [employer] 4.5x3.1 (in/yd) 3 x Per Week/30 Days Discharge Instructions: Secure with Kerlix as directed. WOUND #2: - Foot Wound Laterality: Dorsal, Left Cleanser: Normal Saline Discharge Instructions: Cleanse the wound with Normal Saline prior to applying a clean dressing using gauze sponges, not tissue or cotton balls. Cleanser: Soap and Water Discharge Instructions: May shower and wash wound with dial antibacterial soap and  water prior to dressing change. Peri-Wound Care: keystone Prim Dressing: Promogran Prisma Matrix, 4.34 (sq in) (silver collagen) ary Discharge Instructions: Moisten collagen with hydrogel Secondary Dressing: Zetuvit Plus Silicone Border Dressing 4x4 (in/in) Discharge Instructions: Apply silicone border over primary dressing as directed. Secured With: Coban Self-Adherent Wrap 4x5 (in/yd) Discharge Instructions: Secure with Coban as directed. Secured With: [employer], 4.5x3.1 (in/yd) Discharge Instructions: Secure with Kerlix as directed. WOUND #3: - Shoulder Wound Laterality: Right Cleanser: Soap and Water 1 x Per Day/30 Days Discharge Instructions: May shower and wash wound with dial antibacterial soap and water prior to dressing change. Cleanser: Wound Cleanser 1 x Per Day/30 Days Discharge Instructions: Cleanse the wound with wound cleanser prior to applying a clean dressing using gauze sponges, not tissue or cotton balls. Peri-Wound Care: Skin Prep 1 x Per Day/30 Days Discharge Instructions: Use skin prep as directed Prim Dressing: Iodoform packing strip 1/4 (in) 1 x Per Day/30 Days ary Discharge Instructions: Lightly pack as instructed Secondary Dressing: Zetuvit Plus Silicone Border Dressing 4x4 (in/in) 1 x Per Day/30 Days Discharge Instructions: Apply silicone border over primary dressing as directed. 01/24/2023: The wound on his shoulder has almost completely filled in, with just a little bit of depth  remaining. The dorsal foot wound got macerated; it appears that his home health nurses are applying the drape for his heel wound over the top of this, meaning that the dorsal foot wound is not getting changed as regularly as it is supposed to. There is some slough on the surface. The plantar foot wound has thick wet callus around the perimeter. The dimensions measure a little bit smaller. The wound on his shoulder did not require any debridement. I debrided slough off of the dorsal foot wound. I debrided callus and wet skin from the heel wound. I will watch for his vascular study results; I am concerned that he may not have adequate distal flow to heal this wound despite our aggressive local wound care and hyperbaric oxygen therapy. He may require revascularization to give [place] any hope of preserving his limb. We will continue to pack the wound on his shoulder with iodoform packing strips. Continue Keystone topical antibiotic compound to the heel with a wound VAC. Continue Prisma silver collagen to the dorsal foot. He will continue his hyperbaric oxygen therapy. Follow-up in 1 week. Electronic Signature(s) Signed: 01/30/2023 2:19:36 PM By: Deon Pilling RN, BSN Signed: 02/07/2023 5:14:33 PM By: Fredirick Maudlin MD FACS Previous Signature: 01/24/2023 9:35:54 AM Version By: Fredirick Maudlin MD [place], [place] ([id number]) [id number] Page 11 of 13 Entered By: Deon Pilling on 01/30/2023 14:03:05 -------------------------------------------------------------------------------- HxROS Details Patient Name: Date of Service: [place], [name]. 01/24/2023 8:30 A M Medical Record Number: [id number] Patient Account Number: [id number] Date of Birth/Sex: Treating RN: 1956-10-25 (67 y.o. M) Primary Care Provider: Nelda Bucks Other Clinician: Referring Provider: Treating Provider/Extender: [name] in Treatment: 19 Eyes Medical  History: Positive for: Cataracts Negative for: Glaucoma; Optic Neuritis Ear/Nose/Mouth/Throat Medical History: Negative for: Chronic sinus problems/congestion; Middle ear problems Respiratory Medical History: Positive for: Sleep Apnea Cardiovascular Medical History: Positive for: Hypertension Past Medical History Notes: hyperlipidemia Gastrointestinal Medical History: Negative for: Cirrhosis ; Colitis; Crohns; Hepatitis A; Hepatitis B; Hepatitis C Endocrine Medical History: Positive for: Type II Diabetes Time with diabetes: 20 years Treated with: Insulin Blood sugar tested every day: Yes Tested : 2-3 Genitourinary Medical History: Past Medical History Notes: AKI Immunological Medical History: Negative for: Lupus Erythematosus; Raynauds; Scleroderma  Musculoskeletal Medical History: Positive for: Osteomyelitis - 2023 Neurologic Medical History: Positive for: Neuropathy - Bila lower extremities Oncologic Medical History: Negative for: Received Chemotherapy; Received [place], [place] ([id number]) [id number] Page 12 of 13 Medical History: Negative for: Anorexia/bulimia; Confinement Anxiety HBO Extended History Items Eyes: Cataracts Immunizations Pneumococcal Vaccine: Received Pneumococcal Vaccination: Yes Received Pneumococcal Vaccination On or After 60th Birthday: Yes Implantable Devices Yes Hospitalization / Surgery History Type of Hospitalization/Surgery I and D Left calcaneus back surgery- laminectomy eye surgery- Bila cataracts shoulder arthroscopy Family and Social History Cancer: Yes - Father,Mother; Diabetes: Yes - Mother,Father; Heart Disease: No; Hereditary Spherocytosis: No; Hypertension: No; Kidney Disease: No; Lung Disease: Yes - Father; Seizures: No; Stroke: No; Thyroid Problems: Yes - Mother; Tuberculosis: No; Never smoker; Marital Status - Married; Alcohol Use: Never; Drug Use: No History;  Caffeine Use: Daily; Financial Concerns: No; Food, Clothing or Shelter Needs: No; Support System Lacking: No; Transportation Concerns: No Electronic Signature(s) Signed: 01/24/2023 11:23:53 AM By: Fredirick Maudlin MD FACS Entered By: Fredirick Maudlin on 01/24/2023 09:33:02 -------------------------------------------------------------------------------- SuperBill Details Patient Name: Date of Service: [name], [name] 01/24/2023 Medical Record Number: [id number] Patient Account Number: [id number] Date of Birth/Sex: Treating RN: 06-27-1956 (67 y.o. 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.523 Non-pressure chronic ulcer of other part of left foot with necrosis of muscle L98.492 Non-pressure chronic ulcer of skin of other sites with fat layer exposed L72.3 Sebaceous cyst [id number] Other chronic osteomyelitis, left ankle and foot E11.65 Type 2 diabetes mellitus with hyperglycemia E11.621 Type 2 diabetes mellitus with foot ulcer Facility Procedures : The patient participates with Medicare or their insurance follows the Medicare Facility Guidelines: CPT4 Code Description Modifier Quantity [id number] - DEBRIDE WOUND 1ST 20 SQ CM OR < 1 ICD-10 Diagnosis Description L97.424 Non-pressure chronic ulcer  of left heel and midfoot with necrosis of bone L97.523 Non-pressure chronic ulcer of other part of left foot with necrosis of muscle : Selle, RON 76 The patient participates with Medicare or their insurance follows the Medicare Facility Guidelines: [id number] - DEBRIDE WOUND EA ADDL 20 SQ CM 1 ICD-10 Diagnosis Description 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 L97.523 Non-pressure chronic ulcer of  other part of left foot with necrosis of muscle ALD [name] ([id number].pdf Page 13 of 13 [id number] - WOUND VAC-50 SQ CM OR LESS 1 Physician Procedures : CPT4 Code Description Modifier [id number] - WC PHYS LEVEL 4 - EST PT 25 ICD-10 Diagnosis Description 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 L98.492 Non-pressure chronic ulcer of skin of other sites with fat layer exposed E11.621 Type 2 diabetes mellitus with foot ulcer Quantity: 1 : [id number] - WC PHYS DEBR WO ANESTH 20 SQ CM ICD-10 Diagnosis Description 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

## 2023-01-24 NOTE — Progress Notes (Signed)
Exam observed by tiffany garner.

## 2023-01-24 NOTE — Progress Notes (Addendum)
[name], [name] ([id number]) [id number] Page 1 of 2 Visit Report for 01/24/2023 HBO Details Patient Name: Date of Service: [name], [name]. 01/24/2023 10:00 A M Medical Record Number: [id number] Patient Account Number: [id number] Date of Birth/Sex: Treating RN: 1956/02/24 (67 y.o. 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 HBO Treatment Details Treatment Number: 46 Patient Type: Outpatient Chamber Type: Monoplace Chamber Serial #: [id number] Treatment Protocol: 2.5 ATA with 90 minutes oxygen, with two 5 minute air breaks Treatment Details Compression Rate Down: 1.5 psi / minute De-Compression Rate Up: 2.0 psi / minute A breaks and breathing ir Compress Tx Pressure periods Decompress Decompress Begins Reached (leave unused spaces Begins Ends blank) Chamber Pressure (ATA 1 2.5 2.5 2.5 2.5 2.5 - - 2.5 1 ) Clock Time (24 hr) 10:10 10:28 10:58 11:03 11:33 11:38 - - 12:08 12:19 Treatment Length: 129 (minutes) Treatment Segments: 4 Vital Signs Capillary Blood Glucose Reference Range: 80 - 120 mg / dl HBO Diabetic Blood Glucose Intervention Range: <131 mg/dl or >249 mg/dl Type: Time Vitals Blood Respiratory Capillary Blood Glucose Pulse Action Pulse: Temperature: Taken: Pressure: Rate: Glucose (mg/dl): Meter #: Oximetry (%) Taken: Pre 08:36 155/81 80 16 97.9 Post 12:21 151/68 73 18 97.4 222 1 none per protocol Pre 10:03 203 1 none per protocol. Treatment Response Treatment Toleration: Well Treatment Completion Status: Treatment Completed without Adverse Event Treatment Notes Patient  had a wound care encounter prior to HBO today. Blood glucose upon arrival was 203 mg/dL. Patient stated that his blood glucose was high this morning and that he took "9 units of fast-acting insulin and 80 units long acting insulin." Patient stated that he also ate some breakfast and it was eggs today. Patient was placed in the chamber after performing safety check and chamber was compressed at 1 psi/min until 6 psig at which point patient confirmed that ear equalization was normal and rate set was increased to 2 psi/min. Mr. Bailey tolerated treatment and decompression of the chamber at 2 psi/min. Post-treatment glucose level was 222 mg/dL. Patient was stable upon discharge. Physician HBO Attestation: I certify that I supervised this HBO treatment in accordance with Medicare guidelines. A trained emergency response team is readily available per Yes hospital policies and procedures. Continue HBOT as ordered. Yes Electronic Signature(s) Signed: 01/24/2023 4:35:04 PM By: [name] Previous Signature: 01/24/2023 2:14:57 PM Version By: [name] CHT EMT BS , , Entered By: [name] on 01/24/2023 16:35:03 [name] ([id number].pdf Page 2 of 2 -------------------------------------------------------------------------------- HBO Safety Checklist Details Patient Name: Date of Service: [name], [name]. 01/24/2023 10:00 A M Medical Record Number: [id number] Patient Account Number: [id number] Date of Birth/Sex: Treating RN: 1956-09-10 (67 y.o. [name] Primary Care [name]: [name] Other Clinician: Donavan [name] Referring [name]: Treating [name]/Extender: [name] in Treatment: 19 HBO Safety Checklist Items Safety Checklist Consent Form Signed Patient voided / foley secured and emptied When did you last eato 0700 Last dose of injectable or oral agent 0650 Ostomy pouch emptied  and vented if applicable NA All implantable devices assessed, documented and approved NA Intravenous access site secured and place NA Valuables secured Linens and cotton and cotton/polyester blend (less than 51%  polyester) Personal oil-based products / skin lotions / body lotions removed Wigs or hairpieces removed NA Smoking or tobacco materials removed NA Books / newspapers / magazines / loose paper removed Cologne, aftershave, perfume and deodorant removed Jewelry removed (may wrap wedding band) Make-up removed Hair care products removed Battery operated devices (external) removed Heating patches and chemical warmers removed Titanium eyewear removed Nail polish cured greater than 10 hours NA Casting material cured greater than 10 hours NA Hearing aids removed NA Loose dentures or partials removed dentures removed Prosthetics have been removed NA Patient demonstrates correct use of air break device (if applicable) Patient concerns have been addressed Patient grounding bracelet on and cord attached to chamber Specifics for Inpatients (complete in addition to above) Medication sheet sent with patient NA Intravenous medications needed or due during therapy sent with patient NA Drainage tubes (e.g. nasogastric tube or chest tube secured and vented) NA Endotracheal or Tracheotomy tube secured NA Cuff deflated of air and inflated with saline NA Airway suctioned NA Notes Paper version used prior to treatment. Electronic Signature(s) Signed: 01/24/2023 1:19:30 PM By: [name] CHT EMT BS , , Entered By: [name] on 01/24/2023 13:19:30

## 2023-01-25 ENCOUNTER — Encounter (HOSPITAL_BASED_OUTPATIENT_CLINIC_OR_DEPARTMENT_OTHER): Payer: Medicare Other | Admitting: General Surgery

## 2023-01-25 DIAGNOSIS — E11621 Type 2 diabetes mellitus with foot ulcer: Secondary | ICD-10-CM | POA: Diagnosis not present

## 2023-01-25 LAB — GLUCOSE, CAPILLARY
Glucose-Capillary: 186 mg/dL — ABNORMAL HIGH (ref 70–99)
Glucose-Capillary: 167 mg/dL — ABNORMAL HIGH (ref 70–99)

## 2023-01-25 NOTE — Progress Notes (Signed)
[name], [name] ([id number]) [id number] Page 1 of 2 Visit Report for 01/25/2023 HBO Details Patient Name: Date of Service: [place], [name] 01/25/2023 10:00 A M Medical Record Number: [id number] Patient Account Number: [id number] Date of Birth/Sex: Treating RN: Apr 14, 1956 (67 y.o. 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 HBO Treatment Details Treatment Number: 47 Patient Type: Outpatient Chamber Type: Monoplace Chamber Serial #: [id number] Treatment Protocol: 2.5 ATA with 90 minutes oxygen, with two 5 minute air breaks Treatment Details Compression Rate Down: 2.0 psi / minute De-Compression Rate Up: 2.0 psi / minute A breaks and breathing ir Compress Tx Pressure periods Decompress Decompress Begins Reached (leave unused spaces Begins Ends blank) Chamber Pressure (ATA 1 2.5 2.5 2.5 2.5 2.5 - - 2.5 1 ) Clock Time (24 hr) 09:24 09:38 10:08 10:13 10:43 10:48 - - 11:18 11:29 Treatment Length: 125 (minutes) Treatment Segments: 4 Vital Signs Capillary Blood Glucose Reference Range: 80 - 120 mg / dl HBO Diabetic Blood Glucose Intervention Range: <131 mg/dl or >249 mg/dl Time Vitals Blood Respiratory Capillary Blood Glucose Pulse Action Type: Pulse: Temperature: Taken: Pressure: Rate: Glucose (mg/dl): Meter #: Oximetry (%) Taken: Pre 08:45 186 Post 11:36 151/68 70 18 97.7 167 Pre 09:19 149/74 75 16 98.6 Treatment Response Treatment Completion Status: Treatment Completed without Adverse Event Treatment Notes The patient stated that he had 5 eggs with cheese before coming in for  treatment today. He stated that he had " 9 units of fast and 80 units of slow insulin at 0650." Physician HBO Attestation: I certify that I supervised this HBO treatment in accordance with Medicare guidelines. A trained emergency response team is readily available per Yes hospital policies and procedures. Continue HBOT as ordered. Yes Electronic Signature(s) Signed: 01/25/2023 2:19:13 PM By: Fredirick Maudlin MD FACS Previous Signature: 01/25/2023 2:00:41 PM Version By: Valeria Batman EMT Entered By: Fredirick Maudlin on 01/25/2023 [place], [name] ([id number].pdf Page 2 of 2 -------------------------------------------------------------------------------- HBO Safety Checklist Details Patient Name: Date of Service: [place], [name] 01/25/2023 10:00 A M Medical Record Number: [id number] Patient Account Number: [id number] Date of Birth/Sex: Treating RN: 1956/10/09 (67 y.o. [name] Primary Care Bianca Raneri: Nelda Bucks Other Clinician: Valeria Batman Referring Orval Dortch: Treating Alfredia Desanctis/Extender: Doristine Bosworth in Treatment: 19 HBO Safety Checklist Items Safety Checklist Consent Form Signed Patient voided / foley secured and emptied When did you last eato 0726 Last dose of injectable or oral agent 0650 Ostomy pouch emptied and vented if applicable NA All implantable devices assessed, documented and approved NA Libre2 Dialysis shunt Intravenous access site secured and place NA Valuables secured Linens and cotton and cotton/polyester blend (less than 51% polyester) Personal oil-based products / skin lotions / body lotions removed Wigs or hairpieces removed NA Smoking or tobacco materials removed Books / newspapers / magazines / loose paper removed Cologne, aftershave, perfume and deodorant removed Jewelry removed (may wrap wedding band) Make-up removed NA Hair care products removed Battery operated  devices (external) removed Heating patches and chemical warmers removed Titanium eyewear removed NA Nail polish cured greater than 10 hours NA Casting material cured greater than 10 hours NA Hearing aids removed NA Loose dentures or partials removed removed by patient Prosthetics have  been removed NA Patient demonstrates correct use of air break device (if applicable) Patient concerns have been addressed Patient grounding bracelet on and cord attached to chamber Specifics for Inpatients (complete in addition to above) Medication sheet sent with patient NA Intravenous medications needed or due during therapy sent with patient NA Drainage tubes (e.g. nasogastric tube or chest tube secured and vented) NA Endotracheal or Tracheotomy tube secured NA Cuff deflated of air and inflated with saline NA Airway suctioned NA Notes The safety checklist was done before the treatment was started. Electronic Signature(s) Signed: 01/25/2023 1:56:38 PM By: Valeria Batman EMT Entered By: Valeria Batman on 01/25/2023 13:56:38

## 2023-01-25 NOTE — Progress Notes (Signed)
JYMIR, WEYAND R ([id number]) [id number] Page 1 of 2 Visit Report for 01/25/2023 Arrival Information Details Patient Name: Date of Service: [place], [name] 01/25/2023 10:00 A M Medical Record Number: [id number] Patient Account Number: [id number] Date of Birth/Sex: Treating RN: [birth year] (67 y.o. [name] Primary Care Hendricks Schwandt: Nelda Bucks Other Clinician: Valeria Batman Referring Airiana Elman: Treating Chenae Brager/Extender: Doristine Bosworth in Treatment: 66 Visit Information History Since Last Visit All ordered tests and consults were completed: Yes Patient Arrived: Knee Scooter Added or deleted any medications: No Arrival Time: 08:47 Any new allergies or adverse reactions: No Accompanied By: None Had a fall or experienced change in No Transfer Assistance: None activities of daily living that may affect Patient Identification Verified: Yes risk of falls: Secondary Verification Process Completed: Yes Signs or symptoms of abuse/neglect since last visito No Patient Requires Transmission-Based Precautions: No Hospitalized since last visit: No Patient Has Alerts: No Implantable device outside of the clinic excluding No cellular tissue based products placed in the center since last visit: Pain Present Now: No Electronic Signature(s) Signed: 01/25/2023 1:54:20 PM By: Valeria Batman EMT Entered By: Valeria Batman on 01/25/2023 13:54:20 -------------------------------------------------------------------------------- Encounter Discharge Information Details Patient Name: Date of Service: [place], [name] 01/25/2023 10:00 A M Medical Record Number: [id number] Patient Account Number: [id number] Date of Birth/Sex: Treating RN: 12-May-1956 (67 y.o. [name] Primary Care Reg Bircher: Nelda Bucks Other Clinician: Valeria Batman Referring Karlie Aung: Treating Kha Hari/Extender: Doristine Bosworth  in Treatment: 19 Encounter Discharge Information Items Discharge Condition: Stable Ambulatory Status: Knee Scooter Discharge Destination: Home Transportation: Private Auto Accompanied By: None Schedule Follow-up Appointment: Yes Clinical Summary of Care: Electronic Signature(s) Signed: 01/25/2023 2:01:55 PM By: Valeria Batman EMT Entered By: Valeria Batman on 01/25/2023 14:01:55 [name] ([id number]) [id number] Page 2 of 2 -------------------------------------------------------------------------------- Vitals Details Patient Name: Date of Service: [place], [name] 01/25/2023 10:00 A M Medical Record Number: [id number] Patient Account Number: [id number] Date of Birth/Sex: Treating RN: July 29, 1956 (67 y.o. [name] Primary Care Benna Arno: Nelda Bucks Other Clinician: Valeria Batman Referring Wissam Resor: Treating Emanuelle Hammerstrom/Extender: Doristine Bosworth in Treatment: 19 Vital Signs Time Taken: 08:45 Capillary Blood Glucose (mg/dl): 186 Height (in): 74 Reference Range: 80 - 120 mg / dl Weight (lbs): 245 Body Mass Index (BMI): 31.5 Electronic Signature(s) Signed: 01/25/2023 1:54:36 PM By: Valeria Batman EMT Entered By: Valeria Batman on 01/25/2023 13:54:35

## 2023-01-25 NOTE — Progress Notes (Signed)
[name], [name] ([id number]) [id number] Page 1 of 2 Visit Report for 01/25/2023 Problem List Details Patient Name: Date of Service: [name], [name] 01/25/2023 10:00 A M Medical Record Number: [id number] Patient Account Number: [id number] Date of Birth/Sex: Treating RN: 11/03/1956 (67 y.o. [name] Primary Care Provider: Nelda Bucks Other Clinician: Valeria Batman Referring Provider: Treating Provider/Extender: Doristine Bosworth in Treatment: 19 Active Problems ICD-10 Encounter Code Description Active Date MDM Diagnosis L97.424 Non-pressure chronic ulcer of left heel and midfoot with 09/10/2022 No Yes necrosis of bone L97.523 Non-pressure chronic ulcer of other part of left foot with 09/10/2022 No Yes necrosis of muscle L98.492 Non-pressure chronic ulcer of skin of other sites with fat 12/17/2022 No Yes layer exposed L72.3 Sebaceous cyst 12/17/2022 No Yes [id number] Other chronic osteomyelitis, left ankle and foot 09/10/2022 No Yes E11.65 Type 2 diabetes mellitus with hyperglycemia 09/10/2022 No Yes E11.621 Type 2 diabetes mellitus with foot ulcer 09/10/2022 No Yes Inactive Problems Resolved Problems Electronic Signature(s) Signed: 01/25/2023 2:01:22 PM By: Valeria Batman EMT Signed: 01/25/2023 2:18:06 PM By: Fredirick Maudlin MD FACS Entered By: Valeria Batman on 01/25/2023 14:01:21 SuperBill Details -------------------------------------------------------------------------------- [name] ([id number]) [id number] Page 2 of 2 Patient Name: Date of Service: [place], [name] 01/25/2023 Medical Record Number: [id number] Patient Account Number: [id number] Date of Birth/Sex: Treating RN: 20-Sep-1956 (67 y.o. 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.523 Non-pressure chronic ulcer of other part of left foot with necrosis of muscle L98.492 Non-pressure chronic ulcer of skin of other sites with fat layer exposed L72.3 Sebaceous cyst [id number] Other chronic osteomyelitis, left ankle and foot E11.65 Type 2 diabetes mellitus with hyperglycemia E11.621 Type 2 diabetes mellitus with foot ulcer Facility Procedures The patient participates with Medicare or their insurance follows the Medicare Facility Guidelines CPT4 Code Description Modifier Quantity [id number]-(Facility Use Only) HBOT full body chamber, 59mn , 4 ICD-10 Diagnosis Description [phone] chronic osteomyelitis, 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 Physician Procedures Quantity CPT4 Code Description Modifier [id number] WC PHYS HYPERBARIC OXYGEN THERAPY 1 ICD-10 Diagnosis Description 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, 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

## 2023-01-25 NOTE — Progress Notes (Signed)
[name], [name] [name] ([id number]) [id number] Page 1 of 8 Visit Report for 01/25/2023 Arrival Information Details Patient Name: Date of Service: [name], [name]. 01/25/2023 12:30 PM Medical Record Number: [id number] Patient Account Number: [id number] Date of Birth/Sex: Treating RN: Apr 15, 1956 (67 y.o. [name], [name] Primary Care [name]: [name] Other Clinician: Referring [name]: Treating [name]/Extender: [name] in Treatment: 7 Visit Information History Since Last Visit Added or deleted any medications: No Patient Arrived: Knee Scooter Any new allergies or adverse reactions: No Arrival Time: 14:24 Had a fall or experienced change in No Accompanied By: self activities of daily living that may affect Transfer Assistance: None risk of falls: Patient Identification Verified: Yes Signs or symptoms of abuse/neglect since last visito No Secondary Verification Process Completed: Yes Hospitalized since last visit: No Patient Requires Transmission-Based Precautions: No Implantable device outside of the clinic excluding No Patient Has Alerts: No cellular tissue based products placed in the center since last visit: Has Dressing in Place as Prescribed: Yes Pain Present Now: No Electronic Signature(s) Signed: 01/25/2023 3:32:39 PM By: [name] Entered By: [name] on 01/25/2023 14:24:15 -------------------------------------------------------------------------------- Clinic Level of Care Assessment Details Patient Name: Date of Service: [name], [name]. 01/25/2023 12:30 PM Medical Record Number: [id number] Patient Account Number: [id number] Date of Birth/Sex: Treating RN: 05-28-1956 (67 y.o. [name] Primary Care [name]: [name] Other Clinician: Referring [name]: Treating [name]/Extender: [name] in Treatment: 19 Clinic Level of Care  Assessment Items TOOL 4 Quantity Score X- 1 0 Use when only an EandM is performed on FOLLOW-UP visit ASSESSMENTS - Nursing Assessment / Reassessment []$  - 0 Reassessment of Co-morbidities (includes updates in patient status) []$  - 0 Reassessment of Adherence to Treatment Plan ASSESSMENTS - Wound and Skin A ssessment / Reassessment []$  - 0 Simple Wound Assessment / Reassessment - one wound []$  - 0 Complex Wound Assessment / Reassessment - multiple wounds []$  - 0 Dermatologic / Skin Assessment (not related to wound area) ASSESSMENTS - Focused Assessment []$  - 0 Circumferential Edema Measurements - multi extremities []$  - 0 Nutritional Assessment / Counseling / Intervention [name], STPIERRE ([id number]) [id number] Page 2 of 8 []$  - 0 Lower Extremity Assessment (monofilament, tuning fork, pulses) []$  - 0 Peripheral Arterial Disease Assessment (using hand held doppler) ASSESSMENTS - Ostomy and/or Continence Assessment and Care []$  - 0 Incontinence Assessment and Management []$  - 0 Ostomy Care Assessment and Management (repouching, etc.) PROCESS - Coordination of Care []$  - 0 Simple Patient / Family Education for ongoing care []$  - 0 Complex (extensive) Patient / Family Education for ongoing care X- 1 10 Staff obtains Programmer, systems, Records, T Results / Process Orders est []$  - 0 Staff telephones HHA, Nursing Homes / Clarify orders / etc []$  - 0 Routine Transfer to another Facility (non-emergent condition) []$  - 0 Routine Hospital Admission (non-emergent condition) []$  - 0 New Admissions / Biomedical engineer / Ordering NPWT Apligraf, etc. , []$  - 0 Emergency Hospital Admission (emergent condition) []$  - 0 Simple Discharge Coordination []$  - 0 Complex (extensive) Discharge Coordination PROCESS - Special Needs []$  - 0 Pediatric / Minor Patient Management []$  - 0 Isolation Patient Management []$  - 0 Hearing / Language / Visual special needs []$  - 0 Assessment of  Community assistance (transportation, D/C planning, etc.) []$  - 0 Additional assistance / Altered mentation []$  - 0 Support Surface(s) Assessment (bed, cushion, seat, etc.) INTERVENTIONS - Wound Cleansing / Measurement X - Simple Wound Cleansing -  one wound 1 5 []$  - 0 Complex Wound Cleansing - multiple wounds []$  - 0 Wound Imaging (photographs - any number of wounds) []$  - 0 Wound Tracing (instead of photographs) []$  - 0 Simple Wound Measurement - one wound []$  - 0 Complex Wound Measurement - multiple wounds INTERVENTIONS - Wound Dressings X - Small Wound Dressing one or multiple wounds 1 10 []$  - 0 Medium Wound Dressing one or multiple wounds []$  - 0 Large Wound Dressing one or multiple wounds []$  - 0 Application of Medications - topical []$  - 0 Application of Medications - injection INTERVENTIONS - Miscellaneous []$  - 0 External ear exam []$  - 0 Specimen Collection (cultures, biopsies, blood, body fluids, etc.) []$  - 0 Specimen(s) / Culture(s) sent or taken to Lab for analysis []$  - 0 Patient Transfer (multiple staff / Civil Service fast streamer / Similar devices) []$  - 0 Simple Staple / Suture removal (25 or less) []$  - 0 Complex Staple / Suture removal (26 or more) []$  - 0 Hypo / Hyperglycemic Management (close monitor of Blood Glucose) [name], [name] ([id number]) [phone] Page 3 of 8 []$  - 0 Ankle / Brachial Index (ABI) - do not check if billed separately X- 1 5 Vital Signs Has the patient been seen at the hospital within the last three years: Yes Total Score: 30 Level Of Care: New/Established - Level 1 Electronic Signature(s) Signed: 01/25/2023 3:32:39 PM By: [name] Entered By: [name] on 01/25/2023 14:26:07 -------------------------------------------------------------------------------- Encounter Discharge Information Details Patient Name: Date of Service: [name], [name] [name]. 01/25/2023 12:30 PM Medical Record Number: [id number] Patient  Account Number: [id number] Date of Birth/Sex: Treating RN: [birth year] (67 y.o. [name] Primary Care [name]: [name] Other Clinician: Referring [name]: Treating Jameison Haji/Extender: [name] in Treatment: 19 Encounter Discharge Information Items Discharge Condition: Stable Ambulatory Status: Knee Scooter Discharge Destination: Home Transportation: Private Auto Accompanied By: self Schedule Follow-up Appointment: Yes Clinical Summary of Care: Patient Declined Electronic Signature(s) Signed: 01/25/2023 3:32:39 PM By: Sabas Sous By: [name] on 01/25/2023 14:25:31 -------------------------------------------------------------------------------- Patient/Caregiver Education Details Patient Name: Date of Service: Hollace Hayward [name]. 2/13/2024andnbsp12:30 PM Medical Record Number: [id number] Patient Account Number: [id number] Date of Birth/Gender: Treating RN: [birth year] (67 y.o. [name] Primary Care Physician: [name] Other Clinician: Referring Physician: Treating Physician/Extender: [name] in Treatment: 19 Education Assessment Education Provided To: Patient Education Topics Provided Wound/Skin Impairment: Methods: Explain/Verbal Responses: Reinforcements needed, State content correctly Electronic Signature(s) Signed: 01/25/2023 3:32:39 PM By: [name] Entered By: [name] on 01/25/2023 14:25:22 KEES, BENOIT ([id number] Page 4 of 8 -------------------------------------------------------------------------------- Wound Assessment Details Patient Name: Date of Service: [place], [name]. 01/25/2023 12:30 PM Medical Record Number: [id number] Patient Account Number: [id number] Date of Birth/Sex: Treating RN: 1956/05/30 (67 y.o. [name] Primary Care Lean Fayson: [name] Other  Clinician: Referring Caedence Snowden: Treating Kirk Sampley/Extender: [name] in Treatment: 19 Wound Status Wound Number: 1 Primary Diabetic Wound/Ulcer of the Lower Extremity Etiology: Wound Location: Left Calcaneus Wound Open Wounding Event: Pressure Injury Status: Date Acquired: 03/09/2019 Comorbid Cataracts, Sleep Apnea, Hypertension, Type II Diabetes, Weeks Of Treatment: 19 History: Osteomyelitis, Neuropathy Clustered Wound: No Wound Measurements Length: (cm) 6 Width: (cm) 4.8 Depth: (cm) 0.3 Area: (cm) 22.619 Volume: (cm) 6.786 % Reduction in Area: -4.7% % Reduction in Volume: 68.6% Epithelialization: Small (1-33%) Wound Description Classification: Grade 3 Wound Margin: Thickened Exudate Amount: Large Exudate Type: Serosanguineous Exudate Color: red, brown  Foul Odor After Cleansing: No Slough/Fibrino Yes Wound Bed Granulation Amount: Large (67-100%) Exposed Structure Granulation Quality: Red, Pale Fascia Exposed: No Necrotic Amount: Small (1-33%) Fat Layer (Subcutaneous Tissue) Exposed: Yes Necrotic Quality: Adherent Slough Tendon Exposed: No Muscle Exposed: No Joint Exposed: No Bone Exposed: Yes Periwound Skin Texture Texture Color No Abnormalities Noted: No No Abnormalities Noted: Yes Callus: Yes Temperature / Pain Temperature: No Abnormality Moisture No Abnormalities Noted: No Maceration: Yes Treatment Notes Wound #1 (Calcaneus) Wound Laterality: Left Cleanser Normal Saline Discharge Instruction: Cleanse the wound with Normal Saline prior to applying a clean dressing using gauze sponges, not tissue or cotton balls. Soap and Water Discharge Instruction: May shower and wash wound with dial antibacterial soap and water prior to dressing change. Peri-Wound Care [address] JAEDEN, SCHNITTKER ([id number]) [id number] Page 5 of 8 VAC Secondary Dressing Secured With Coban  Self-Adherent Wrap 4x5 (in/yd) Discharge Instruction: Secure with Coban as directed. Kerlix Roll Sterile, 4.5x3.1 (in/yd) Discharge Instruction: Secure with Kerlix as directed. Compression Wrap Compression Stockings Add-Ons Electronic Signature(s) Signed: 01/25/2023 3:32:39 PM By: [name] Entered By: [name] on 01/25/2023 14:24:55 -------------------------------------------------------------------------------- Wound Assessment Details Patient Name: Date of Service: [place], [name] [name]. 01/25/2023 12:30 PM Medical Record Number: [id number] Patient Account Number: [id number] Date of Birth/Sex: Treating RN: 28-Apr-1956 (67 y.o. [name] Primary Care Halsey Persaud: [name] Other Clinician: Referring Yamen Castrogiovanni: Treating Remus Hagedorn/Extender: [name] in Treatment: 19 Wound Status Wound Number: 2 Primary Diabetic Wound/Ulcer of the Lower Extremity Etiology: Wound Location: Left, Dorsal Foot Wound Open Wounding Event: Pressure Injury Status: Date Acquired: 07/09/2022 Comorbid Cataracts, Sleep Apnea, Hypertension, Type II Diabetes, Weeks Of Treatment: 19 History: Osteomyelitis, Neuropathy Clustered Wound: No Wound Measurements Length: (cm) 2.2 Width: (cm) 2.8 Depth: (cm) 0.1 Area: (cm) 4.838 Volume: (cm) 0.484 % Reduction in Area: 48.7% % Reduction in Volume: 48.6% Epithelialization: Small (1-33%) Wound Description Classification: Grade 2 Wound Margin: Flat and Intact Exudate Amount: Medium Exudate Type: Serosanguineous Exudate Color: red, brown Foul Odor After Cleansing: No Slough/Fibrino Yes Wound Bed Granulation Amount: Small (1-33%) Exposed Structure Granulation Quality: Red Fascia Exposed: No Necrotic Amount: Large (67-100%) Fat Layer (Subcutaneous Tissue) Exposed: Yes Necrotic Quality: Adherent Slough Tendon Exposed: No Muscle Exposed: No Joint Exposed: No Bone Exposed: No Periwound Skin  Texture Texture Color No Abnormalities Noted: No No Abnormalities Noted: Yes Scarring: Yes Temperature / Pain Temperature: No Abnormality Moisture No Abnormalities NotedKAMORI, OREBAUGH ([id number]) [id number] Page 6 of 8 Treatment Notes Wound #2 (Foot) Wound Laterality: Dorsal, Left Cleanser Normal Saline Discharge Instruction: Cleanse the wound with Normal Saline prior to applying a clean dressing using gauze sponges, not tissue or cotton balls. Soap and Water Discharge Instruction: May shower and wash wound with dial antibacterial soap and water prior to dressing change. Peri-Wound Care keystone Topical Primary Dressing Promogran Prisma Matrix, 4.34 (sq in) (silver collagen) Discharge Instruction: Moisten collagen with hydrogel Secondary Dressing Zetuvit Plus Silicone Border Dressing 4x4 (in/in) Discharge Instruction: Apply silicone border over primary dressing as directed. Secured With [employer] 4x5 (in/yd) Discharge Instruction: Secure with Coban as directed. Kerlix Roll Sterile, 4.5x3.1 (in/yd) Discharge Instruction: Secure with Kerlix as directed. Compression Wrap Compression Stockings Add-Ons Electronic Signature(s) Signed: 01/25/2023 3:32:39 PM By: [name] Entered By: [name] on 01/25/2023 14:25:00 -------------------------------------------------------------------------------- Wound Assessment Details Patient Name: Date of Service: [place], [name] [name]. 01/25/2023 12:30 PM Medical Record Number: [id number] Patient Account Number: [id number] Date of Birth/Sex: Treating RN: 08-17-1956 (  68 y.o. [name] Primary Care Abagale Boulos: [name] Other Clinician: Referring Tylek Boney: Treating Edgar Reisz/Extender: [name] in Treatment: 19 Wound Status Wound Number: 3 Primary Cyst Etiology: Wound Location: Right Shoulder Wound Open Wounding Event: Gradually  Appeared Status: Date Acquired: 12/14/2022 Comorbid Cataracts, Sleep Apnea, Hypertension, Type II Diabetes, Weeks Of Treatment: 5 History: Osteomyelitis, Neuropathy Clustered Wound: No Wound Measurements Length: (cm) 0.3 Width: (cm) 0.2 Depth: (cm) 0.5 Area: (cm) 0.047 Volume: (cm) 0.024 % Reduction in Area: 62.7% % Reduction in Volume: 4% Epithelialization: Small (1-33%) Wound Description Classification: Full Thickness Without Exposed Support Structures Wound Margin: Distinct, outline attached RHYLEY, ZILCH ([id number]) Exudate Amount: Medium Exudate Type: Serosanguineous Exudate Color: red, brown Foul Odor After Cleansing: No Slough/Fibrino No [phone].pdf Page 7 of 8 Wound Bed Granulation Amount: None Present (0%) Exposed Structure Necrotic Amount: None Present (0%) Fascia Exposed: No Fat Layer (Subcutaneous Tissue) Exposed: Yes Tendon Exposed: No Muscle Exposed: No Joint Exposed: No Bone Exposed: No Periwound Skin Texture Texture Color No Abnormalities Noted: No No Abnormalities Noted: Yes Rash: Yes Temperature / Pain Temperature: No Abnormality Moisture No Abnormalities Noted: Yes Treatment Notes Wound #3 (Shoulder) Wound Laterality: Right Cleanser Soap and Water Discharge Instruction: May shower and wash wound with dial antibacterial soap and water prior to dressing change. Wound Cleanser Discharge Instruction: Cleanse the wound with wound cleanser prior to applying a clean dressing using gauze sponges, not tissue or cotton balls. Peri-Wound Care Skin Prep Discharge Instruction: Use skin prep as directed Topical Primary Dressing Iodoform packing strip 1/4 (in) Discharge Instruction: Lightly pack as instructed Secondary Dressing Zetuvit Plus Silicone Border Dressing 4x4 (in/in) Discharge Instruction: Apply silicone border over primary dressing as directed. Secured With Compression Wrap Compression  Stockings Environmental education officer) Signed: 01/25/2023 3:32:39 PM By: [name] Entered By: [name] on 01/25/2023 14:25:04 -------------------------------------------------------------------------------- Vitals Details Patient Name: Date of Service: [name], [name] [name]. 01/25/2023 12:30 PM Medical Record Number: [id number] Patient Account Number: [id number] Date of Birth/Sex: Treating RN: October 12, 1956 (67 y.o. [name] Primary Care Mi Balla: [name] Other Clinician: Referring Kenitra Leventhal: Treating Charletta Voight/Extender: [name] in Treatment: [address] OSBURN, BABIAK [name] ([id number]) [id number] Page 8 of 8 Time Taken: 11:36 Temperature (F): 97.7 Height (in): 74 Pulse (bpm): 70 Weight (lbs): 245 Respiratory Rate (breaths/min): 18 Body Mass Index (BMI): 31.5 Blood Pressure (mmHg): 151/68 Capillary Blood Glucose (mg/dl): 167 Reference Range: 80 - 120 mg / dl Electronic Signature(s) Signed: 01/25/2023 3:32:39 PM By: [name] Entered By: [name] on 01/25/2023 14:24:34

## 2023-01-26 ENCOUNTER — Encounter (HOSPITAL_BASED_OUTPATIENT_CLINIC_OR_DEPARTMENT_OTHER): Payer: Medicare Other | Admitting: General Surgery

## 2023-01-26 DIAGNOSIS — E11621 Type 2 diabetes mellitus with foot ulcer: Secondary | ICD-10-CM | POA: Diagnosis not present

## 2023-01-26 LAB — GLUCOSE, CAPILLARY
Glucose-Capillary: 231 mg/dL — ABNORMAL HIGH (ref 70–99)
Glucose-Capillary: 143 mg/dL — ABNORMAL HIGH (ref 70–99)

## 2023-01-26 NOTE — Progress Notes (Signed)
FILIPPO, TONA R ([id number]) [id number] Page 1 of 1 Visit Report for 01/25/2023 SuperBill Details Patient Name: Date of Service: [name], [name] [name] 01/25/2023 Medical Record Number: [id number] Patient Account Number: [id number] Date of Birth/Sex: Treating RN: November 29, 1956 (67 y.o. 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.523 Non-pressure chronic ulcer of other part of left foot with necrosis of muscle L98.492 Non-pressure chronic ulcer of skin of other sites with fat layer exposed L72.3 Sebaceous cyst [id number] Other chronic osteomyelitis, left ankle and foot E11.65 Type 2 diabetes mellitus with hyperglycemia E11.621 Type 2 diabetes mellitus with foot ulcer Facility Procedures The patient participates with Medicare or their insurance follows the Medicare Facility Guidelines CPT4 Code Description Modifier Quantity [id number] - WOUND CARE VISIT-LEV 1 EST PT 1 Electronic Signature(s) Signed: 01/25/2023 3:32:39 PM By: Adline Peals Signed: 01/25/2023 3:48:13 PM By: Fredirick Maudlin MD FACS Entered By: Adline Peals on 01/25/2023 14:26:14

## 2023-01-27 ENCOUNTER — Encounter (HOSPITAL_BASED_OUTPATIENT_CLINIC_OR_DEPARTMENT_OTHER): Payer: Medicare Other | Admitting: General Surgery

## 2023-01-27 DIAGNOSIS — E11621 Type 2 diabetes mellitus with foot ulcer: Secondary | ICD-10-CM | POA: Diagnosis not present

## 2023-01-27 LAB — GLUCOSE, CAPILLARY
Glucose-Capillary: 219 mg/dL — ABNORMAL HIGH (ref 70–99)
Glucose-Capillary: 283 mg/dL — ABNORMAL HIGH (ref 70–99)

## 2023-01-27 NOTE — Progress Notes (Signed)
TYKING, IKERD R ([id number]) [id number] Page 1 of 2 Visit Report for 01/26/2023 Arrival Information Details Patient Name: Date of Service: [place], [name] 01/26/2023 10:00 A M Medical Record Number: [id number] Patient Account Number: [id number] Date of Birth/Sex: Treating RN: Oct 29, 1956 (67 y.o. [name], [name] Primary Care Genesis Novosad: Nelda Bucks Other Clinician: Valeria Batman Referring Amika Tassin: Treating Alline Pio/Extender: Doristine Bosworth in Treatment: 76 Visit Information History Since Last Visit All ordered tests and consults were completed: Yes Patient Arrived: Knee Scooter Added or deleted any medications: No Arrival Time: 09:22 Any new allergies or adverse reactions: No Accompanied By: None Had a fall or experienced change in No Transfer Assistance: None activities of daily living that may affect Patient Identification Verified: Yes risk of falls: Secondary Verification Process Completed: Yes Signs or symptoms of abuse/neglect since last visito No Patient Requires Transmission-Based Precautions: No Hospitalized since last visit: No Patient Has Alerts: No Implantable device outside of the clinic excluding No cellular tissue based products placed in the center since last visit: Pain Present Now: No Electronic Signature(s) Signed: 01/26/2023 1:12:27 PM By: Valeria Batman EMT Entered By: Valeria Batman on 01/26/2023 13:12:27 -------------------------------------------------------------------------------- Encounter Discharge Information Details Patient Name: Date of Service: [place], [name] 01/26/2023 10:00 A M Medical Record Number: [id number] Patient Account Number: [id number] Date of Birth/Sex: Treating RN: 07/11/1956 (67 y.o. [name] Primary Care Zharia Conrow: Nelda Bucks Other Clinician: Valeria Batman Referring Adileny Delon: Treating Nadie Fiumara/Extender: Doristine Bosworth in  Treatment: 19 Encounter Discharge Information Items Discharge Condition: Stable Ambulatory Status: Knee Scooter Discharge Destination: Home Transportation: Private Auto Accompanied By: None Schedule Follow-up Appointment: Yes Clinical Summary of Care: Electronic Signature(s) Signed: 01/26/2023 1:32:05 PM By: Valeria Batman EMT Entered By: Valeria Batman on 01/26/2023 13:32:05 [name] ([id number] Page 2 of 2 -------------------------------------------------------------------------------- Vitals Details Patient Name: Date of Service: [place], [name] 01/26/2023 10:00 A M Medical Record Number: [id number] Patient Account Number: [id number] Date of Birth/Sex: Treating RN: 09/09/1956 (67 y.o. [name] Primary Care Marchella Hibbard: Nelda Bucks Other Clinician: Valeria Batman Referring Erwin Nishiyama: Treating Marjean Imperato/Extender: Doristine Bosworth in Treatment: 19 Vital Signs Time Taken: 09:27 Capillary Blood Glucose (mg/dl): 231 Height (in): 74 Reference Range: 80 - 120 mg / dl Weight (lbs): 245 Body Mass Index (BMI): 31.5 Electronic Signature(s) Signed: 01/26/2023 1:12:44 PM By: Valeria Batman EMT Entered By: Valeria Batman on 01/26/2023 13:12:44

## 2023-01-27 NOTE — Progress Notes (Signed)
[name], [name] [name] ([id number]) [id number] Page 1 of 2 Visit Report for 01/26/2023 HBO Details Patient Name: Date of Service: [place], [place] [name]. 01/26/2023 10:00 A M Medical Record Number: [id number] Patient Account Number: [id number] Date of Birth/Sex: Treating RN: 04/03/1956 (67 y.o. 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 HBO Treatment Details Treatment Number: 48 Patient Type: Outpatient Chamber Type: Monoplace Chamber Serial #: [id number] Treatment Protocol: 2.5 ATA with 90 minutes oxygen, with two 5 minute air breaks Treatment Details Compression Rate Down: 2.0 psi / minute De-Compression Rate Up: 2.0 psi / minute A breaks and breathing ir Compress Tx Pressure periods Decompress Decompress Begins Reached (leave unused spaces Begins Ends blank) Chamber Pressure (ATA 1 2.5 2.5 2.5 2.5 2.5 - - 2.5 1 ) Clock Time (24 hr) 09:56 10:10 10:41 10:46 11:15 11:21 - - 11:51 12:02 Treatment Length: 126 (minutes) Treatment Segments: 4 Vital Signs Capillary Blood Glucose Reference Range: 80 - 120 mg / dl HBO Diabetic Blood Glucose Intervention Range: <131 mg/dl or >249 mg/dl Time Vitals Blood Respiratory Capillary Blood Glucose Pulse Action Type: Pulse: Temperature: Taken: Pressure: Rate: Glucose (mg/dl): Meter #: Oximetry (%) Taken: Pre 09:27 231 Post 12:07 140/71 66 18 97.5 143 Pre 09:47 151/73 72 18 97.9 Treatment Response Treatment Toleration: Well Treatment Completion Status: Treatment Completed without Adverse Event Treatment Notes The patient stated that he last had something to  eat last night. He also stated that he took "80 units of slow insulin and 6 units of fast insulin" Physician HBO Attestation: I certify that I supervised this HBO treatment in accordance with Medicare guidelines. A trained emergency response team is readily available per Yes hospital policies and procedures. Continue HBOT as ordered. Yes Electronic Signature(s) Signed: 01/26/2023 1:53:56 PM By: [name] Maudlin MD FACS Previous Signature: 01/26/2023 1:28:34 PM Version By: [name] EMT Entered By: [name] on 01/26/2023 13:53:56 [name], [name] ([id number].pdf Page 2 of 2 -------------------------------------------------------------------------------- HBO Safety Checklist Details Patient Name: Date of Service: [place], [place] [name]. 01/26/2023 10:00 A M Medical Record Number: [id number] Patient Account Number: [id number] Date of Birth/Sex: Treating RN: [birth year] (67 y.o. [name], [name] Primary Care [name]: [name] Other Clinician: Valeria [name] Referring [name]: Treating [name]/Extender: [name] in Treatment: 19 HBO Safety Checklist Items Safety Checklist Consent Form Signed Patient voided / foley secured and emptied When did you last eato last night Last dose of injectable or oral agent 0745 Ostomy pouch emptied and vented if applicable NA All implantable devices assessed, documented and approved NA Intravenous access site secured and place NA Valuables secured Linens and cotton and cotton/polyester blend (less than 51% polyester) Personal oil-based products / skin lotions / body lotions removed Wigs or hairpieces removed NA Smoking or tobacco materials removed Books / newspapers / magazines / loose paper removed Cologne, aftershave, perfume and deodorant removed Jewelry removed (may wrap wedding band) Make-up removed NA Hair care products removed Battery operated devices (external)  removed Heating patches and chemical warmers removed Titanium eyewear removed NA Nail polish cured greater than 10 hours NA Casting material cured greater than 10 hours NA Hearing aids removed NA Loose dentures or partials removed removed by patient Prosthetics have been removed NA Patient  demonstrates correct use of air break device (if applicable) Patient concerns have been addressed Patient grounding bracelet on and cord attached to chamber Specifics for Inpatients (complete in addition to above) Medication sheet sent with patient NA Intravenous medications needed or due during therapy sent with patient NA Drainage tubes (e.g. nasogastric tube or chest tube secured and vented) NA Endotracheal or Tracheotomy tube secured NA Cuff deflated of air and inflated with saline NA Airway suctioned NA Notes The safety checklist was done before the treatment was started. Electronic Signature(s) Signed: 01/26/2023 1:14:02 PM By: [name] EMT Entered By: [name] on 01/26/2023 13:14:02

## 2023-01-27 NOTE — Progress Notes (Signed)
BASEM, MAISONET R ([id number]) [id number] Page 1 of 2 Visit Report for 01/26/2023 Problem List Details Patient Name: Date of Service: [name], [name] [name] 01/26/2023 10:00 A M Medical Record Number: [id number] Patient Account Number: [id number] Date of Birth/Sex: Treating RN: 08-17-1956 (67 y.o. 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 L97.523 Non-pressure chronic ulcer of other part of left foot with 09/10/2022 No Yes necrosis of muscle L98.492 Non-pressure chronic ulcer of skin of other sites with fat 12/17/2022 No Yes layer exposed L72.3 Sebaceous cyst 12/17/2022 No Yes [id number] Other chronic osteomyelitis, left ankle and foot 09/10/2022 No Yes E11.65 Type 2 diabetes mellitus with hyperglycemia 09/10/2022 No Yes E11.621 Type 2 diabetes mellitus with foot ulcer 09/10/2022 No Yes Inactive Problems Resolved Problems Electronic Signature(s) Signed: 01/26/2023 1:31:18 PM By: Valeria Batman EMT Signed: 01/26/2023 1:52:57 PM By: Fredirick Maudlin MD FACS Entered By: Valeria Batman on 01/26/2023 13:31:18 SuperBill Details -------------------------------------------------------------------------------- [name] ([id number]) [id number] Page 2 of 2 Patient Name: Date of Service: [name], [name] [name] 01/26/2023 Medical Record Number: [id number] Patient Account Number: [id number] Date of Birth/Sex: Treating RN: 19-Jan-1956 (67 y.o. 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 L98.492 Non-pressure chronic ulcer of skin of other sites with fat layer exposed L72.3 Sebaceous cyst [id number] Other chronic osteomyelitis, left ankle and foot E11.65 Type 2 diabetes mellitus with hyperglycemia E11.621 Type 2 diabetes mellitus with foot ulcer Facility Procedures The patient participates with Medicare or their insurance follows the Medicare Facility Guidelines CPT4 Code Description Modifier Quantity [id number] G0277-(Facility Use Only) HBOT full body chamber, 36mn , 4 ICD-10 Diagnosis Description [phone] chronic osteomyelitis, 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 Physician Procedures Quantity CPT4 Code Description Modifier [id number] PHYS HYPERBARIC OXYGEN THERAPY 1 ICD-10 Diagnosis Description 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, 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

## 2023-01-28 ENCOUNTER — Encounter (HOSPITAL_BASED_OUTPATIENT_CLINIC_OR_DEPARTMENT_OTHER): Payer: Medicare Other | Admitting: General Surgery

## 2023-01-28 DIAGNOSIS — E11621 Type 2 diabetes mellitus with foot ulcer: Secondary | ICD-10-CM | POA: Diagnosis not present

## 2023-01-28 LAB — GLUCOSE, CAPILLARY
Glucose-Capillary: 170 mg/dL — ABNORMAL HIGH (ref 70–99)
Glucose-Capillary: 178 mg/dL — ABNORMAL HIGH (ref 70–99)

## 2023-01-28 NOTE — Progress Notes (Signed)
KENNEY, LUBECK [name] ([id number]) [id number] Page 1 of 2 Visit Report for 01/27/2023 HBO Details Patient Name: Date of Service: [name], [name]. 01/27/2023 10:00 A M Medical Record Number: [id number] Patient Account Number: [id number] Date of Birth/Sex: Treating RN: 26-Aug-1956 (67 y.o. 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 HBO Treatment Details Treatment Number: 49 Patient Type: Outpatient Chamber Type: Monoplace Chamber Serial #: [id number] Treatment Protocol: 2.5 ATA with 90 minutes oxygen, with two 5 minute air breaks Treatment Details Compression Rate Down: 2.0 psi / minute De-Compression Rate Up: 2.0 psi / minute A breaks and breathing ir Compress Tx Pressure periods Decompress Decompress Begins Reached (leave unused spaces Begins Ends blank) Chamber Pressure (ATA 1 2.5 2.5 2.5 2.5 2.5 - - 2.5 1 ) Clock Time (24 hr) 10:23 10:37 11:07 11:12 11:42 11:47 - - 12:17 12:26 Treatment Length: 123 (minutes) Treatment Segments: 4 Vital Signs Capillary Blood Glucose Reference Range: 80 - 120 mg / dl HBO Diabetic Blood Glucose Intervention Range: <131 mg/dl or >249 mg/dl Time Vitals Blood Respiratory Capillary Blood Glucose Pulse Action Type: Pulse: Temperature: Taken: Pressure: Rate: Glucose (mg/dl): Meter #: Oximetry (%) Taken: Pre 10:17 125/66 73 18 97.5 219 Post 12:31 148/78 67 18 98.4 283 Treatment Response Treatment Toleration: Well Treatment Completion Status: Treatment Completed without Adverse Event Treatment Notes The patient stated that his last insulin was yesterday. He also  stated that he had orange juice with 2 Carnation Instant Breakfast this morning before coming in for treatment. Physician HBO Attestation: I certify that I supervised this HBO treatment in accordance with Medicare guidelines. A trained emergency response team is readily available per Yes hospital policies and procedures. Continue HBOT as ordered. Yes Electronic Signature(s) Signed: 01/27/2023 4:12:10 PM By: Fredirick Maudlin MD FACS Previous Signature: 01/27/2023 3:05:59 PM Version By: Valeria Batman EMT Entered By: Fredirick Maudlin on 01/27/2023 16:12:10 [name] ([id number] Page 2 of 2 -------------------------------------------------------------------------------- HBO Safety Checklist Details Patient Name: Date of Service: [place], [name]. 01/27/2023 10:00 A M Medical Record Number: [id number] Patient Account Number: [id number] Date of Birth/Sex: Treating RN: 1956-07-04 (68 y.o. [name] Primary Care [place]: Nelda Bucks Other Clinician: Valeria Batman Referring Cassadi Purdie: Treating Jarett Dralle/Extender: Doristine Bosworth in Treatment: 19 HBO Safety Checklist Items Safety Checklist Consent Form Signed Patient voided / foley secured and emptied When did you last eato 0630 Last dose of injectable or oral agent Yesterday Ostomy pouch emptied and vented if applicable NA All implantable devices assessed, documented and approved NA Intravenous access site secured and place NA Valuables secured Linens and cotton and cotton/polyester blend (less than 51% polyester) Personal oil-based products / skin lotions / body lotions removed Wigs or hairpieces removed Smoking or tobacco materials removed Books / newspapers / magazines / loose paper removed Cologne, aftershave, perfume and deodorant removed Jewelry removed (may wrap wedding band) Make-up removed NA Hair care products removed NA Battery operated devices  (external) removed Heating patches and chemical warmers removed Titanium eyewear removed NA Nail polish cured greater than 10 hours NA Casting material cured greater than 10 hours NA Hearing aids removed NA Loose dentures or partials removed Prosthetics have been removed NA Patient demonstrates correct use of air break  device (if applicable) Patient concerns have been addressed Patient grounding bracelet on and cord attached to chamber Specifics for Inpatients (complete in addition to above) Medication sheet sent with patient NA Intravenous medications needed or due during therapy sent with patient NA Drainage tubes (e.g. nasogastric tube or chest tube secured and vented) NA Endotracheal or Tracheotomy tube secured NA Cuff deflated of air and inflated with saline NA Airway suctioned NA Notes The safety checklist was done before the treatment was started. Electronic Signature(s) Signed: 01/27/2023 2:57:48 PM By: Valeria Batman EMT Entered By: Valeria Batman on 01/27/2023 14:57:48

## 2023-01-28 NOTE — Progress Notes (Signed)
MANOLITO, MEERSMAN R ([id number]) [id number] Page 1 of 2 Visit Report for 01/27/2023 Arrival Information Details Patient Name: Date of Service: [place], [name] 01/27/2023 10:00 A M Medical Record Number: [id number] Patient Account Number: [id number] Date of Birth/Sex: Treating RN: August 18, 1956 (67 y.o. Jimmey Ralph, Jamie Primary Care Haelie Clapp: Nelda Bucks Other Clinician: Valeria Batman Referring Jaiceon Collister: Treating Clover Feehan/Extender: Doristine Bosworth in Treatment: 91 Visit Information History Since Last Visit All ordered tests and consults were completed: Yes Patient Arrived: Knee Scooter Added or deleted any medications: No Arrival Time: 09:50 Any new allergies or adverse reactions: No Accompanied By: None Had a fall or experienced change in No Transfer Assistance: None activities of daily living that may affect Patient Identification Verified: Yes risk of falls: Secondary Verification Process Completed: Yes Signs or symptoms of abuse/neglect since last visito No Patient Requires Transmission-Based Precautions: No Hospitalized since last visit: No Patient Has Alerts: No Implantable device outside of the clinic excluding No cellular tissue based products placed in the center since last visit: Pain Present Now: No Electronic Signature(s) Signed: 01/27/2023 2:49:11 PM By: Valeria Batman EMT Entered By: Valeria Batman on 01/27/2023 14:49:10 -------------------------------------------------------------------------------- Encounter Discharge Information Details Patient Name: Date of Service: [place], [name] 01/27/2023 10:00 A M Medical Record Number: [id number] Patient Account Number: [id number] Date of Birth/Sex: Treating RN: Apr 24, 1956 (67 y.o. [name] Primary Care Marshel Golubski: Nelda Bucks Other Clinician: Valeria Batman Referring Delfin Squillace: Treating Deanza Upperman/Extender: Doristine Bosworth in  Treatment: 68 Encounter Discharge Information Items Discharge Condition: Stable Ambulatory Status: Knee Scooter Discharge Destination: Home Transportation: Private Auto Accompanied By: None Schedule Follow-up Appointment: Yes Clinical Summary of Care: Electronic Signature(s) Signed: 01/27/2023 3:08:43 PM By: Valeria Batman EMT Entered By: Valeria Batman on 01/27/2023 15:08:43 [name] ([id number]) [id number] Page 2 of 2 -------------------------------------------------------------------------------- Vitals Details Patient Name: Date of Service: [place], [name] 01/27/2023 10:00 A M Medical Record Number: [id number] Patient Account Number: [id number] Date of Birth/Sex: Treating RN: 1956/06/18 (67 y.o. [name] Primary Care Teshawn Moan: Nelda Bucks Other Clinician: Valeria Batman Referring Freddrick Gladson: Treating Jeryn Cerney/Extender: Doristine Bosworth in Treatment: 19 Vital Signs Time Taken: 10:17 Temperature (F): 97.5 Height (in): 74 Pulse (bpm): 73 Weight (lbs): 245 Respiratory Rate (breaths/min): 18 Body Mass Index (BMI): 31.5 Blood Pressure (mmHg): 125/66 Capillary Blood Glucose (mg/dl): 219 Reference Range: 80 - 120 mg / dl Electronic Signature(s) Signed: 01/27/2023 2:56:35 PM By: Valeria Batman EMT Entered By: Valeria Batman on 01/27/2023 14:56:35

## 2023-01-28 NOTE — Progress Notes (Signed)
[name], [name] ([id number]) [id number] Page 1 of 2 Visit Report for 01/27/2023 Problem List Details Patient Name: Date of Service: [place], [name] 01/27/2023 10:00 A M Medical Record Number: [id number] Patient Account Number: [id number] Date of Birth/Sex: Treating RN: 01-12-1956 (67 y.o. 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 L97.523 Non-pressure chronic ulcer of other part of left foot with 09/10/2022 No Yes necrosis of muscle L98.492 Non-pressure chronic ulcer of skin of other sites with fat 12/17/2022 No Yes layer exposed L72.3 Sebaceous cyst 12/17/2022 No Yes [id number] Other chronic osteomyelitis, left ankle and foot 09/10/2022 No Yes E11.65 Type 2 diabetes mellitus with hyperglycemia 09/10/2022 No Yes E11.621 Type 2 diabetes mellitus with foot ulcer 09/10/2022 No Yes Inactive Problems Resolved Problems Electronic Signature(s) Signed: 01/27/2023 3:07:21 PM By: Valeria Batman EMT Signed: 01/27/2023 3:44:25 PM By: Fredirick Maudlin MD FACS Entered By: Valeria Batman on 01/27/2023 15:07:20 SuperBill Details -------------------------------------------------------------------------------- [name] ([id number]) [id number] Page 2 of 2 Patient Name: Date of Service: [place], [name] 01/27/2023 Medical Record Number: [id number] Patient Account Number: [id number] Date of Birth/Sex: Treating RN: 1956-08-24 (67 y.o. 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.523 Non-pressure chronic ulcer of other part of left foot with necrosis of muscle L98.492 Non-pressure chronic ulcer of skin of other sites with fat layer exposed L72.3 Sebaceous cyst [id number] Other chronic osteomyelitis, left ankle and foot E11.65 Type 2 diabetes mellitus with hyperglycemia E11.621 Type 2 diabetes mellitus with foot ulcer Facility Procedures The patient participates with Medicare or their insurance follows the Medicare Facility Guidelines CPT4 Code Description Modifier Quantity [id number] G0277-(Facility Use Only) HBOT full body chamber, 28mn , 4 ICD-10 Diagnosis Description [id number] chronic osteomyelitis, 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 Physician Procedures Quantity CPT4 Code Description Modifier [id number] PHYS HYPERBARIC OXYGEN THERAPY 1 ICD-10 Diagnosis Description 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, 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

## 2023-01-29 NOTE — Progress Notes (Signed)
HATCH, PITZER R ([id number]) [id number] Page 1 of 2 Visit Report for 01/28/2023 Arrival Information Details Patient Name: Date of Service: [place], [name] 01/28/2023 10:00 A M Medical Record Number: [id number] Patient Account Number: [id number] Date of Birth/Sex: Treating RN: 03-30-1956 (67 y.o. [name] Primary Care Donnisha Besecker: Nelda Bucks Other Clinician: Donavan Burnet Referring Anesha Hackert: Treating Blake Vetrano/Extender: Colin Benton in Treatment: 102 Visit Information History Since Last Visit All ordered tests and consults were completed: Yes Patient Arrived: Knee Scooter Added or deleted any medications: No Arrival Time: 09:15 Any new allergies or adverse reactions: No Accompanied By: self Had a fall or experienced change in No Transfer Assistance: None activities of daily living that may affect Patient Identification Verified: Yes risk of falls: Secondary Verification Process Completed: Yes Signs or symptoms of abuse/neglect since last visito No Patient Requires Transmission-Based Precautions: No Hospitalized since last visit: No Patient Has Alerts: No Implantable device outside of the clinic excluding No cellular tissue based products placed in the center since last visit: Pain Present Now: No Electronic Signature(s) Signed: 01/28/2023 2:05:06 PM By: Donavan Burnet CHT EMT BS , , Entered By: Donavan Burnet on 01/28/2023 14:05:06 -------------------------------------------------------------------------------- Encounter Discharge Information Details Patient Name: Date of Service: [place], [name] 01/28/2023 10:00 A M Medical Record Number: [id number] Patient Account Number: [id number] Date of Birth/Sex: Treating RN: [birth year] (67 y.o. [name] Primary Care Karole Oo: Nelda Bucks Other Clinician: Donavan Burnet Referring Darral Rishel: Treating Delinda Malan/Extender: Colin Benton in Treatment: 20 Encounter Discharge Information Items Discharge Condition: Stable Ambulatory Status: Knee Scooter Discharge Destination: Home Transportation: Private Auto Accompanied By: self Schedule Follow-up Appointment: No Clinical Summary of Care: Electronic Signature(s) Signed: 01/28/2023 2:15:42 PM By: Donavan Burnet CHT EMT BS , , Entered By: Donavan Burnet on 01/28/2023 14:15:41 [name] ([id number]) [phone].pdf Page 2 of 2 -------------------------------------------------------------------------------- Vitals Details Patient Name: Date of Service: [place], [name] 01/28/2023 10:00 A M Medical Record Number: [id number] Patient Account Number: [id number] Date of Birth/Sex: Treating RN: Jan 20, 1956 (67 y.o. [name] Primary Care Latalia Etzler: Nelda Bucks Other Clinician: Donavan Burnet Referring Jethro Radke: Treating Madoline Bhatt/Extender: Colin Benton in Treatment: 20 Vital Signs Time Taken: 10:03 Temperature (F): 98.6 Height (in): 74 Pulse (bpm): 81 Weight (lbs): 245 Respiratory Rate (breaths/min): 16 Body Mass Index (BMI): 31.5 Blood Pressure (mmHg): 140/63 Capillary Blood Glucose (mg/dl): 170 Reference Range: 80 - 120 mg / dl Electronic Signature(s) Signed: 01/28/2023 2:06:14 PM By: Donavan Burnet CHT EMT BS , , Entered By: Donavan Burnet on 01/28/2023 14:06:14

## 2023-01-29 NOTE — Progress Notes (Signed)
BUZ, DORGAN R ([id number]) [id number] Page 1 of 2 Visit Report for 01/28/2023 HBO Details Patient Name: Date of Service: [place], [name] 01/28/2023 10:00 A M Medical Record Number: [id number] Patient Account Number: [id number] Date of Birth/Sex: Treating RN: [birth year] (67 y.o. 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 HBO Treatment Details Treatment Number: 50 Patient Type: Outpatient Chamber Type: Monoplace Chamber Serial #: [id number] Treatment Protocol: 2.5 ATA with 90 minutes oxygen, with two 5 minute air breaks Treatment Details Compression Rate Down: 1.5 psi / minute De-Compression Rate Up: 2.0 psi / minute A breaks and breathing ir Compress Tx Pressure periods Decompress Decompress Begins Reached (leave unused spaces Begins Ends blank) Chamber Pressure (ATA 1 2.5 2.5 2.5 2.5 2.5 - - 2.5 1 ) Clock Time (24 hr) 10:15 10:28 11:00 11:03 11:33 11:38 - - 12:08 12:19 Treatment Length: 124 (minutes) Treatment Segments: 4 Vital Signs Capillary Blood Glucose Reference Range: 80 - 120 mg / dl HBO Diabetic Blood Glucose Intervention Range: <131 mg/dl or >249 mg/dl Type: Time Vitals Blood Respiratory Capillary Blood Glucose Pulse Action Pulse: Temperature: Taken: Pressure: Rate: Glucose (mg/dl): Meter #: Oximetry (%) Taken: Pre 10:03 140/63 81 16 98.6 170 1 none per protocol Post 12:28 159/75 81 16 98.4 178 1 none per protocol Treatment Response Treatment Toleration: Well Treatment Completion Status: Treatment Completed without Adverse Event Treatment Notes Patient arrived,  blood glucose measured at 170 mg/dL [id number] Patient states that he ate 5 eggs with english muffin at 0815 and administered 6 units of "fast- acting insulin" and 80 units of "long-acting insulin" at 0755. Patient travelled at 1 psi/min until confirming that ear equalization was normal and at that point rate set was increased to 2 psi/min. Patient tolerated treatment and subsequent decompression of the chamber at 2 psi/min. Post-treatment vital signs were normal. Patient was stable upon discharge. Physician HBO Attestation: I certify that I supervised this HBO treatment in accordance with Medicare guidelines. A trained emergency response team is readily available per Yes hospital policies and procedures. Continue HBOT as ordered. Yes Electronic Signature(s) Signed: 01/28/2023 3:20:31 PM By: Fredirick Maudlin MD FACS Previous Signature: 01/28/2023 3:20:21 PM Version By: Fredirick Maudlin MD FACS Previous Signature: 01/28/2023 3:19:40 PM Version By: Fredirick Maudlin MD FACS Previous Signature: 01/28/2023 2:14:34 PM Version By: Donavan Burnet CHT EMT BS , , Previous Signature: 01/28/2023 2:14:02 PM Version By: Donavan Burnet CHT EMT BS , , Entered By: Fredirick Maudlin on 01/28/2023 15:20:30 [name] ([id number] Page 2 of 2 -------------------------------------------------------------------------------- HBO Safety Checklist Details Patient Name: Date of Service: [place], [name] 01/28/2023 10:00 A M Medical Record Number: [id number] Patient Account Number: [id number] Date of Birth/Sex: Treating RN: 12/16/1955 (67 y.o. [name] Primary Care Ohana Birdwell: Nelda Bucks Other Clinician: Valeria Batman Referring Aayush Gelpi: Treating Genna Casimir/Extender: Colin Benton in Treatment: 20 HBO Safety Checklist Items Safety Checklist Consent Form Signed Patient voided / foley secured and emptied When did you last eato 0815 Last  dose of injectable or oral agent [id number] Ostomy pouch emptied and vented if applicable NA All implantable devices assessed, documented and approved NA Intravenous access site secured and place NA Valuables secured Linens and cotton and cotton/polyester blend (less than 51%  polyester) Personal oil-based products / skin lotions / body lotions removed Wigs or hairpieces removed NA Smoking or tobacco materials removed NA Books / newspapers / magazines / loose paper removed Cologne, aftershave, perfume and deodorant removed Jewelry removed (may wrap wedding band) Make-up removed NA Hair care products removed Battery operated devices (external) removed Heating patches and chemical warmers removed Titanium eyewear removed NA Nail polish cured greater than 10 hours NA Casting material cured greater than 10 hours NA Hearing aids removed NA Loose dentures or partials removed NA Prosthetics have been removed NA Patient demonstrates correct use of air break device (if applicable) Patient concerns have been addressed Patient grounding bracelet on and cord attached to chamber Specifics for Inpatients (complete in addition to above) Medication sheet sent with patient NA Intravenous medications needed or due during therapy sent with patient NA Drainage tubes (e.g. nasogastric tube or chest tube secured and vented) NA Endotracheal or Tracheotomy tube secured NA Cuff deflated of air and inflated with saline NA Airway suctioned NA Notes The safety checklist was done before the treatment was started. Electronic Signature(s) Signed: 01/28/2023 2:08:18 PM By: Donavan Burnet CHT EMT BS , , Entered By: Donavan Burnet on 01/28/2023 14:08:18

## 2023-01-29 NOTE — Progress Notes (Signed)
CLEATIS, BELVILLE R ([id number]) [id number] Page 1 of 1 Visit Report for 01/28/2023 SuperBill Details Patient Name: Date of Service: [name], [name] [name] 01/28/2023 Medical Record Number: [id number] Patient Account Number: [id number] Date of Birth/Sex: Treating RN: 06/02/1956 (67 y.o. 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.523 Non-pressure chronic ulcer of other part of left foot with necrosis of muscle L98.492 Non-pressure chronic ulcer of skin of other sites with fat layer exposed L72.3 Sebaceous cyst [id number] Other chronic osteomyelitis, left ankle and foot E11.65 Type 2 diabetes mellitus with hyperglycemia E11.621 Type 2 diabetes mellitus with foot ulcer Facility Procedures The patient participates with Medicare or their insurance follows the Medicare Facility Guidelines CPT4 Code Description Modifier Quantity [id number]-(Facility Use Only) HBOT full body chamber, 69mn , 4 ICD-10 Diagnosis Description [id number] chronic osteomyelitis, 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 Physician Procedures Quantity CPT4 Code Description Modifier [id number] PHYS HYPERBARIC OXYGEN THERAPY 1 ICD-10 Diagnosis Description 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, 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

## 2023-01-31 ENCOUNTER — Ambulatory Visit (HOSPITAL_BASED_OUTPATIENT_CLINIC_OR_DEPARTMENT_OTHER): Payer: Medicare Other | Admitting: General Surgery

## 2023-01-31 ENCOUNTER — Encounter (HOSPITAL_BASED_OUTPATIENT_CLINIC_OR_DEPARTMENT_OTHER): Payer: Medicare Other | Admitting: General Surgery

## 2023-01-31 NOTE — H&P (View-Only) (Signed)
[facility] [place]  ASSESSMENT / PLAN: [name] is a 67 y.o. 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 Blood glucose control with goal A1c < 7%. Blood pressure control with goal blood pressure < 140/90 mmHg. Lipid reduction therapy with goal LDL-C <100 mg/dL (<70 if symptomatic from PAD).  Aspirin 18m PO QD.  Atorvastatin 40-80mg PO QD (or other "high intensity" statin therapy). equate.  Plan left lower extremity angiogram with possible intervention via common femoral approach in cath lab 02/11/2023.   CHIEF COMPLAINT: Ulcer about right lower extremity  HISTORY OF PRESENT ILLNESS: [name] Hellyeris a 67y.o. male who has been struggling with chronic osteomyelitis of the left heel.I counseled him extensively about this problem and the limb threatening problem he is facing. We had a long discussion about this problem and that even with a successful revascularization, he may need a BKA.  He strongly desires limb salvage.  Past Medical History:  Diagnosis Date   Diabetic foot ulcers (HCC)    DM2 (diabetes mellitus, type 2) [place]    HTN (hypertension)    Sleep apnea    No Cpap    Past Surgical History:  Procedure Laterality Date   BACK SURGERY     Laminectomy-Bilateral   EYE SURGERY     Bilateral cataracts and eye implants   I & D EXTREMITY Left 09/21/2019   Procedure: LEFT PARTIAL CALCANEOUS EXCISION;  Surgeon: DNewt Minion MD;  [facility]  Service: Orthopedics;  Laterality: Left;   KNEE ARTHROSCOPY Left    SHOULDER ARTHROSCOPY     Right    History reviewed. No pertinent family history.  Social History   Socioeconomic History   Marital status: Married    Spouse name: Not on file   Number of  children: Not on file   Years of education: Not on file   Highest education level: Not on file  Occupational History   Not on file  Tobacco Use   Smoking status: Never   Smokeless tobacco: Never  Vaping Use   Vaping Use: Never used  Substance and Sexual Activity   Alcohol use: Yes    Comment: rarely   Drug use: Never   Sexual activity: Not on file  Other Topics Concern   Not on file  Social History Narrative   Not on file   Social Determinants of Health   Financial Resource Strain: Not on file  Food Insecurity: Not on file  Transportation Needs: Not on file  Physical Activity: Not on file  Stress: Not on file  Social Connections: Not on file  Intimate Partner Violence: Not on file    Allergies  Allergen Reactions   Bee Venom Anaphylaxis, Rash and Swelling    Rash   Rash   Cefepime Rash    Unclear if due to cefepime    Current Outpatient Medications  Medication Sig Dispense Refill   aspirin EC 81 MG tablet Take 81 mg by mouth daily.      atorvastatin (LIPITOR) 10 MG tablet Take 10 mg by mouth daily.      B-D UF III MINI PEN NEEDLES 31G X 5 MM MISC USE AS DIRECTED UP TO 3 TIMES DAILY     clotrimazole-betamethasone (LOTRISONE) cream Apply fingertip amount to  bottom of both feet daily. 30 g 0   Continuous Blood Gluc Sensor (FREESTYLE LIBRE 14 DAY SENSOR) MISC Use to check blood glucose     doxycycline (VIBRAMYCIN) 100 MG capsule Take 1 capsule (100 mg total) by mouth 2 (two) times daily. 20 capsule 0   EPINEPHrine (EPIPEN 2-PAK) 0.3 mg/0.3 mL IJ SOAJ injection Inject 0.3 mg into the muscle as needed for anaphylaxis.      furosemide (LASIX) 40 MG tablet Take 40 mg by mouth daily.      insulin aspart protamine - aspart (NOVOLOG MIX 70/30 FLEXPEN) (70-30) 100 UNIT/ML FlexPen Inject 50 Units into the skin 2 (two) times daily with a meal.      losartan (COZAAR) 50 MG tablet Take 50 mg by mouth daily.      NOVOLOG FLEXPEN 100 UNIT/ML FlexPen Inject 10 Units into the skin 3  (three) times daily with meals.      potassium chloride (K-DUR) 10 MEQ tablet Take 20 mEq by mouth daily.   0   TRESIBA FLEXTOUCH 200 UNIT/ML SOPN Inject 80 Units into the skin daily.     No current facility-administered medications for this visit.    PHYSICAL EXAM Vitals:   02/01/23 1419  BP: 137/66  Pulse: 83  Resp: 20  Temp: 99.6 F (37.6 C)  SpO2: 98%  Weight: 245 lb (111.1 kg)  Height: 6' 2"$  (1.88 m)    Chronically ill-appearing man in no distress Regular rate and rhythm Unlabored breathing No pedal pulses L foot bandage with VAC  PERTINENT LABORATORY AND RADIOLOGIC DATA  Most recent CBC    Latest Ref Rng & Units 12/14/2022   11:25 AM 09/21/2019    7:53 AM 06/10/2018    5:02 AM  CBC  WBC 4.0 - 10.5 K/uL 10.6  5.1  5.3   Hemoglobin 13.0 - 17.0 g/dL 12.6  12.9  11.9   Hematocrit 39.0 - 52.0 % 38.2  38.7  34.2   Platelets 150 - 400 K/uL 327  204  146      Most recent CMP    Latest Ref Rng & Units 12/14/2022   11:25 AM 09/21/2019    7:53 AM 06/10/2018    5:02 AM  CMP  Glucose 70 - 99 mg/dL 143  316  227   BUN 8 - 23 mg/dL 24  18  23   $ Creatinine 0.61 - 1.24 mg/dL 1.63  1.03  1.34   Sodium 135 - 145 mmol/L 134  137  135   Potassium 3.5 - 5.1 mmol/L 4.3  4.4  3.7   Chloride 98 - 111 mmol/L 98  107  101   CO2 22 - 32 mmol/L 26  23  24   $ Calcium 8.9 - 10.3 mg/dL 9.3  8.6  8.9   Total Protein 6.5 - 8.1 g/dL 8.3     Total Bilirubin 0.3 - 1.2 mg/dL 1.7     Alkaline Phos 38 - 126 U/L 92     AST 15 - 41 U/L 12     ALT 0 - 44 U/L 10      Right: 30-49% stenosis noted in the iliac segment. Diffuse  atherosclerosis.   Left: 30-49% stenosis noted in the iliac segment. 30-49% stenosis noted in  the common femoral artery. 50-74% stenosis noted in the superficial  femoral artery. 50-74% stenosis noted in the popliteal artery. He has  stenosis in the EIA CFA SFA and popliteal   artery.   [name]. Stanford Breed, MD  FACS [facility] Phone  Number: [phone] 02/01/2023 5:53 PM   Total time spent on preparing this encounter including chart review, data review, collecting history, examining the patient, coordinating care for this new patient, 60 minutes.  Portions of this report may have been transcribed using voice recognition software.  Every effort has been made to ensure accuracy; however, inadvertent computerized transcription errors may still be present.

## 2023-01-31 NOTE — Progress Notes (Unsigned)
[facility] [place]  ASSESSMENT / PLAN: [name] is a 67 y.o. 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 Blood glucose control with goal A1c < 7%. Blood pressure control with goal blood pressure < 140/90 mmHg. Lipid reduction therapy with goal LDL-C <100 mg/dL (<70 if symptomatic from PAD).  Aspirin 18m PO QD.  Atorvastatin 40-80mg PO QD (or other "high intensity" statin therapy). equate.  Plan left lower extremity angiogram with possible intervention via common femoral approach in cath lab 02/11/2023.   CHIEF COMPLAINT: Ulcer about right lower extremity  HISTORY OF PRESENT ILLNESS: [name] Hellyeris a 67y.o. male who has been struggling with chronic osteomyelitis of the left heel.I counseled him extensively about this problem and the limb threatening problem he is facing. We had a long discussion about this problem and that even with a successful revascularization, he may need a BKA.  He strongly desires limb salvage.  Past Medical History:  Diagnosis Date   Diabetic foot ulcers (HCC)    DM2 (diabetes mellitus, type 2) ([facility]    HTN (hypertension)    Sleep apnea    No Cpap    Past Surgical History:  Procedure Laterality Date   BACK SURGERY     Laminectomy-Bilateral   EYE SURGERY     Bilateral cataracts and eye implants   I & D EXTREMITY Left 09/21/2019   Procedure: LEFT PARTIAL CALCANEOUS EXCISION;  Surgeon: DNewt Minion MD;  Location[facility]  Service: Orthopedics;  Laterality: Left;   KNEE ARTHROSCOPY Left    SHOULDER ARTHROSCOPY     Right    History reviewed. No pertinent family history.  Social History   Socioeconomic History   Marital status: Married    Spouse name: Not on file   Number of  children: Not on file   Years of education: Not on file   Highest education level: Not on file  Occupational History   Not on file  Tobacco Use   Smoking status: Never   Smokeless tobacco: Never  Vaping Use   Vaping Use: Never used  Substance and Sexual Activity   Alcohol use: Yes    Comment: rarely   Drug use: Never   Sexual activity: Not on file  Other Topics Concern   Not on file  Social History Narrative   Not on file   Social Determinants of Health   Financial Resource Strain: Not on file  Food Insecurity: Not on file  Transportation Needs: Not on file  Physical Activity: Not on file  Stress: Not on file  Social Connections: Not on file  Intimate Partner Violence: Not on file    Allergies  Allergen Reactions   Bee Venom Anaphylaxis, Rash and Swelling    Rash   Rash   Cefepime Rash    Unclear if due to cefepime    Current Outpatient Medications  Medication Sig Dispense Refill   aspirin EC 81 MG tablet Take 81 mg by mouth daily.      atorvastatin (LIPITOR) 10 MG tablet Take 10 mg by mouth daily.      B-D UF III MINI PEN NEEDLES 31G X 5 MM MISC USE AS DIRECTED UP TO 3 TIMES DAILY     clotrimazole-betamethasone (LOTRISONE) cream Apply fingertip amount to  bottom of both feet daily. 30 g 0   Continuous Blood Gluc Sensor (FREESTYLE LIBRE 14 DAY SENSOR) MISC Use to check blood glucose     doxycycline (VIBRAMYCIN) 100 MG capsule Take 1 capsule (100 mg total) by mouth 2 (two) times daily. 20 capsule 0   EPINEPHrine (EPIPEN 2-PAK) 0.3 mg/0.3 mL IJ SOAJ injection Inject 0.3 mg into the muscle as needed for anaphylaxis.      furosemide (LASIX) 40 MG tablet Take 40 mg by mouth daily.      insulin aspart protamine - aspart (NOVOLOG MIX 70/30 FLEXPEN) (70-30) 100 UNIT/ML FlexPen Inject 50 Units into the skin 2 (two) times daily with a meal.      losartan (COZAAR) 50 MG tablet Take 50 mg by mouth daily.      NOVOLOG FLEXPEN 100 UNIT/ML FlexPen Inject 10 Units into the skin 3  (three) times daily with meals.      potassium chloride (K-DUR) 10 MEQ tablet Take 20 mEq by mouth daily.   0   TRESIBA FLEXTOUCH 200 UNIT/ML SOPN Inject 80 Units into the skin daily.     No current facility-administered medications for this visit.    PHYSICAL EXAM Vitals:   02/01/23 1419  BP: 137/66  Pulse: 83  Resp: 20  Temp: 99.6 F (37.6 C)  SpO2: 98%  Weight: 245 lb (111.1 kg)  Height: 6' 2"$  (1.88 m)    Chronically ill-appearing man in no distress Regular rate and rhythm Unlabored breathing No pedal pulses L foot bandage with VAC  PERTINENT LABORATORY AND RADIOLOGIC DATA  Most recent CBC    Latest Ref Rng & Units 12/14/2022   11:25 AM 09/21/2019    7:53 AM 06/10/2018    5:02 AM  CBC  WBC 4.0 - 10.5 K/uL 10.6  5.1  5.3   Hemoglobin 13.0 - 17.0 g/dL 12.6  12.9  11.9   Hematocrit 39.0 - 52.0 % 38.2  38.7  34.2   Platelets 150 - 400 K/uL 327  204  146      Most recent CMP    Latest Ref Rng & Units 12/14/2022   11:25 AM 09/21/2019    7:53 AM 06/10/2018    5:02 AM  CMP  Glucose 70 - 99 mg/dL 143  316  227   BUN 8 - 23 mg/dL 24  18  23   $ Creatinine 0.61 - 1.24 mg/dL 1.63  1.03  1.34   Sodium 135 - 145 mmol/L 134  137  135   Potassium 3.5 - 5.1 mmol/L 4.3  4.4  3.7   Chloride 98 - 111 mmol/L 98  107  101   CO2 22 - 32 mmol/L 26  23  24   $ Calcium 8.9 - 10.3 mg/dL 9.3  8.6  8.9   Total Protein 6.5 - 8.1 g/dL 8.3     Total Bilirubin 0.3 - 1.2 mg/dL 1.7     Alkaline Phos 38 - 126 U/L 92     AST 15 - 41 U/L 12     ALT 0 - 44 U/L 10      Right: 30-49% stenosis noted in the iliac segment. Diffuse  atherosclerosis.   Left: 30-49% stenosis noted in the iliac segment. 30-49% stenosis noted in  the common femoral artery. 50-74% stenosis noted in the superficial  femoral artery. 50-74% stenosis noted in the popliteal artery. He has  stenosis in the EIA CFA SFA and popliteal   artery.   [name]. Stanford Breed, MD  FACS [facility] Phone  Number: [phone] 02/01/2023 5:53 PM   Total time spent on preparing this encounter including chart review, data review, collecting history, examining the patient, coordinating care for this new patient, 60 minutes.  Portions of this report may have been transcribed using voice recognition software.  Every effort has been made to ensure accuracy; however, inadvertent computerized transcription errors may still be present.

## 2023-02-01 ENCOUNTER — Encounter (HOSPITAL_BASED_OUTPATIENT_CLINIC_OR_DEPARTMENT_OTHER): Payer: Medicare Other | Admitting: General Surgery

## 2023-02-01 ENCOUNTER — Ambulatory Visit (INDEPENDENT_AMBULATORY_CARE_PROVIDER_SITE_OTHER): Payer: Medicare Other | Admitting: Vascular Surgery

## 2023-02-01 ENCOUNTER — Encounter: Payer: Self-pay | Admitting: Vascular Surgery

## 2023-02-01 VITALS — BP 137/66 | HR 83 | Temp 99.6°F | Resp 20 | Ht 74.0 in | Wt 245.0 lb

## 2023-02-01 DIAGNOSIS — L97509 Non-pressure chronic ulcer of other part of unspecified foot with unspecified severity: Secondary | ICD-10-CM

## 2023-02-01 DIAGNOSIS — E11621 Type 2 diabetes mellitus with foot ulcer: Secondary | ICD-10-CM | POA: Diagnosis not present

## 2023-02-01 LAB — GLUCOSE, CAPILLARY
Glucose-Capillary: 148 mg/dL — ABNORMAL HIGH (ref 70–99)
Glucose-Capillary: 71 mg/dL (ref 70–99)
Glucose-Capillary: 78 mg/dL (ref 70–99)

## 2023-02-02 ENCOUNTER — Encounter (HOSPITAL_BASED_OUTPATIENT_CLINIC_OR_DEPARTMENT_OTHER): Payer: Medicare Other | Admitting: General Surgery

## 2023-02-02 ENCOUNTER — Other Ambulatory Visit: Payer: Self-pay

## 2023-02-02 DIAGNOSIS — E11621 Type 2 diabetes mellitus with foot ulcer: Secondary | ICD-10-CM | POA: Diagnosis not present

## 2023-02-02 DIAGNOSIS — I7025 Atherosclerosis of native arteries of other extremities with ulceration: Secondary | ICD-10-CM

## 2023-02-02 LAB — GLUCOSE, CAPILLARY
Glucose-Capillary: 91 mg/dL (ref 70–99)
Glucose-Capillary: 152 mg/dL — ABNORMAL HIGH (ref 70–99)

## 2023-02-02 NOTE — Progress Notes (Signed)
[name], [name] ([id number]) [id number] Page 1 of 2 Visit Report for 02/01/2023 Arrival Information Details Patient Name: Date of Service: [name], [name]. 02/01/2023 10:00 A M Medical Record Number: [id number] Patient Account Number: [id number] Date of Birth/Sex: Treating RN: August 26, 1956 (67 y.o. Burnadette Pop, Lauren Primary Care Mikya Don: Nelda Bucks Other Clinician: Referring Sugey Trevathan: Treating Samyria Rudie/Extender: Doristine Bosworth in Treatment: 55 Visit Information History Since Last Visit All ordered tests and consults were completed: Yes Patient Arrived: Knee Scooter Added or deleted any medications: No Arrival Time: 09:29 Any new allergies or adverse reactions: No Accompanied By: None Had a fall or experienced change in No Transfer Assistance: None activities of daily living that may affect Patient Identification Verified: Yes risk of falls: Secondary Verification Process Completed: Yes Signs or symptoms of abuse/neglect since last visito No Patient Requires Transmission-Based Precautions: No Hospitalized since last visit: No Patient Has Alerts: No Implantable device outside of the clinic excluding No cellular tissue based products placed in the center since last visit: Pain Present Now: No Electronic Signature(s) Signed: 02/01/2023 1:13:33 PM By: Valeria Batman EMT Entered By: Valeria Batman on 02/01/2023 13:13:33 -------------------------------------------------------------------------------- Encounter Discharge Information Details Patient Name: Date of Service: [name], [name]. 02/01/2023 10:00 A M Medical Record Number: [id number] Patient Account Number: [id number] Date of Birth/Sex: Treating RN: 25-Mar-1956 (67 y.o. Burnadette Pop, Lauren Primary Care Advaith Lamarque: Nelda Bucks Other Clinician: Valeria Batman Referring Lincoln Kleiner: Treating Gualberto Wahlen/Extender: Doristine Bosworth in Treatment:  20 Encounter Discharge Information Items Discharge Condition: Stable Ambulatory Status: Knee Scooter Discharge Destination: Home Transportation: Private Auto Accompanied By: None Schedule Follow-up Appointment: Yes Clinical Summary of Care: Electronic Signature(s) Signed: 02/01/2023 1:25:45 PM By: Valeria Batman EMT Entered By: Valeria Batman on 02/01/2023 13:25:45 [name] ([id number]) [id number] Page 2 of 2 -------------------------------------------------------------------------------- Vitals Details Patient Name: Date of Service: [name], [name]. 02/01/2023 10:00 A M Medical Record Number: [id number] Patient Account Number: [id number] Date of Birth/Sex: Treating RN: 1956-05-19 (67 y.o. Burnadette Pop, Lauren Primary Care Obrien Huskins: Nelda Bucks Other Clinician: Valeria Batman Referring Krista Som: Treating Chukwudi Ewen/Extender: Doristine Bosworth in Treatment: 20 Vital Signs Time Taken: 09:33 Capillary Blood Glucose (mg/dl): 148 Height (in): 74 Reference Range: 80 - 120 mg / dl Weight (lbs): 245 Body Mass Index (BMI): 31.5 Electronic Signature(s) Signed: 02/01/2023 1:13:48 PM By: Valeria Batman EMT Entered By: Valeria Batman on 02/01/2023 13:13:48

## 2023-02-02 NOTE — Progress Notes (Signed)
AHMIR, DELANGEL R ([id number]) [id number] Page 1 of 2 Visit Report for 02/01/2023 HBO Details Patient Name: Date of Service: [place], [name] 02/01/2023 10:00 A M Medical Record Number: [id number] Patient Account Number: [id number] Date of Birth/Sex: Treating RN: 11/01/1956 (67 y.o. 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 HBO Treatment Details Treatment Number: 51 Patient Type: Outpatient Chamber Type: Monoplace Chamber Serial #: [id number] Treatment Protocol: 2.5 ATA with 90 minutes oxygen, with two 5 minute air breaks Treatment Details Compression Rate Down: 2.0 psi / minute De-Compression Rate Up: 2.0 psi / minute A breaks and breathing ir Compress Tx Pressure periods Decompress Decompress Begins Reached (leave unused spaces Begins Ends blank) Chamber Pressure (ATA 1 2.5 2.5 2.5 2.5 2.5 - - 2.5 1 ) Clock Time (24 hr) 10:14 10:26 10:56 11:01 11:31 11:36 - - 12:06 12:17 Treatment Length: 123 (minutes) Treatment Segments: 4 Vital Signs Capillary Blood Glucose Reference Range: 80 - 120 mg / dl HBO Diabetic Blood Glucose Intervention Range: <131 mg/dl or >249 mg/dl Type: Time Vitals Blood Pulse: Respiratory Temperature: Capillary Blood Glucose Pulse Action Taken: Pressure: Rate: Glucose (mg/dl): Meter #: Oximetry (%) Taken: Pre 09:33 148 Post 12:24 157/89 69 16 98.1 71 Patient given 8 oz of Glucerna to drink.. Pre 10:03 118/64 74 16 981 Post 12:56 15 Spoke with Dr. Celine Ahr Treatment Response Treatment Toleration: Well Treatment Completion Status: Treatment Completed without Adverse  Event Treatment Notes On arrival the patient blood sugar was 148. He stated that at 0300 his blood sugar was 326 and took 80 units of "Long acting" and 9 units of " Fast acting" insulin. That he had orange juice this morning at 0905. Post treatment blood sugar was 71. The patient was given 8oz of Glucerna to drink. The patient when into the bathroom and changed out of HBO scrubs. At 1256 rechecked the patient blood sugar and it was 78. I spoke with Dr. Celine Ahr. The patient stated that he was leaving and was going to get something to eat. Physician HBO Attestation: I certify that I supervised this HBO treatment in accordance with Medicare guidelines. A trained emergency response team is readily available per Yes hospital policies and procedures. Continue HBOT as ordered. Yes Electronic Signature(s) Signed: 02/01/2023 3:08:24 PM By: Fredirick Maudlin MD FACS Previous Signature: 02/01/2023 1:24:14 PM Version By: Valeria Batman EMT Entered By: Fredirick Maudlin on 02/01/2023 15:08:23 [name] ([id number].pdf Page 2 of 2 -------------------------------------------------------------------------------- HBO Safety Checklist Details Patient Name: Date of Service: [place], [name] 02/01/2023 10:00 A M Medical Record Number: [id number] Patient Account Number: [id number] Date of Birth/Sex: Treating RN: 1956/06/18 (67 y.o. Burnadette Pop, [place] Primary Care Jasmin Trumbull: Nelda Bucks Other Clinician: Valeria Batman Referring Allegra Cerniglia: Treating Devario Bucklew/Extender: Doristine Bosworth in Treatment: 20 HBO Safety Checklist Items Safety Checklist Consent Form Signed Patient voided / foley secured and emptied When did you last eato 0905 Last dose of injectable or oral agent 0300 Ostomy pouch emptied and vented if applicable NA All implantable devices assessed, documented and approved NA Intravenous access site secured and place NA Valuables  secured Linens and cotton and cotton/polyester blend (less than 51% polyester) Personal oil-based products / skin lotions / body lotions removed Wigs or  hairpieces removed NA Smoking or tobacco materials removed Books / newspapers / magazines / loose paper removed Cologne, aftershave, perfume and deodorant removed Jewelry removed (may wrap wedding band) Make-up removed NA Hair care products removed Battery operated devices (external) removed Heating patches and chemical warmers removed Titanium eyewear removed NA Nail polish cured greater than 10 hours NA Casting material cured greater than 10 hours NA Hearing aids removed NA Loose dentures or partials removed removed by patient Prosthetics have been removed NA Patient demonstrates correct use of air break device (if applicable) Patient concerns have been addressed Patient grounding bracelet on and cord attached to chamber Specifics for Inpatients (complete in addition to above) Medication sheet sent with patient NA Intravenous medications needed or due during therapy sent with patient NA Drainage tubes (e.g. nasogastric tube or chest tube secured and vented) NA Endotracheal or Tracheotomy tube secured NA Cuff deflated of air and inflated with saline NA Airway suctioned NA Notes The safety checklist was done before the treatment was started. Electronic Signature(s) Signed: 02/01/2023 1:15:28 PM By: Valeria Batman EMT Entered By: Valeria Batman on 02/01/2023 13:15:27

## 2023-02-02 NOTE — Progress Notes (Signed)
THAMAS, KROENING R ([id number]) [id number] Page 1 of 2 Visit Report for 02/01/2023 Problem List Details Patient Name: Date of Service: [name], [name] [name] 02/01/2023 10:00 A M Medical Record Number: [id number] Patient Account Number: [id number] Date of Birth/Sex: Treating RN: 04-02-1956 (67 y.o. 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 L97.523 Non-pressure chronic ulcer of other part of left foot with 09/10/2022 No Yes necrosis of muscle L98.492 Non-pressure chronic ulcer of skin of other sites with fat 12/17/2022 No Yes layer exposed L72.3 Sebaceous cyst 12/17/2022 No Yes [id number] Other chronic osteomyelitis, left ankle and foot 09/10/2022 No Yes E11.65 Type 2 diabetes mellitus with hyperglycemia 09/10/2022 No Yes E11.621 Type 2 diabetes mellitus with foot ulcer 09/10/2022 No Yes Inactive Problems Resolved Problems Electronic Signature(s) Signed: 02/01/2023 1:25:08 PM By: Valeria Batman EMT Signed: 02/01/2023 3:07:11 PM By: Fredirick Maudlin MD FACS Entered By: Valeria Batman on 02/01/2023 13:25:08 SuperBill Details -------------------------------------------------------------------------------- [name] ([id number]) [id number] Page 2 of 2 Patient Name: Date of Service: [name], [name] [name] 02/01/2023 Medical Record Number: [id number] Patient Account Number: [id number] Date of Birth/Sex: Treating RN: 1956/06/16 (67 y.o. 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.523 Non-pressure chronic ulcer of other part of left foot with necrosis of muscle L98.492 Non-pressure chronic ulcer of skin of other sites with fat layer exposed L72.3 Sebaceous cyst [id number] Other chronic osteomyelitis, left ankle and foot E11.65 Type 2 diabetes mellitus with hyperglycemia E11.621 Type 2 diabetes mellitus with foot ulcer Facility Procedures The patient participates with Medicare or their insurance follows the Medicare Facility Guidelines CPT4 Code Description Modifier Quantity [id number]-(Facility Use Only) HBOT full body chamber, 61mn , 4 ICD-10 Diagnosis Description [id number] chronic osteomyelitis, 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 Physician Procedures Quantity CPT4 Code Description Modifier [id number] PHYS HYPERBARIC OXYGEN THERAPY 1 ICD-10 Diagnosis Description 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, 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

## 2023-02-03 ENCOUNTER — Encounter (HOSPITAL_BASED_OUTPATIENT_CLINIC_OR_DEPARTMENT_OTHER): Payer: Medicare Other | Admitting: General Surgery

## 2023-02-03 DIAGNOSIS — E11621 Type 2 diabetes mellitus with foot ulcer: Secondary | ICD-10-CM | POA: Diagnosis not present

## 2023-02-03 LAB — GLUCOSE, CAPILLARY
Glucose-Capillary: 268 mg/dL — ABNORMAL HIGH (ref 70–99)
Glucose-Capillary: 264 mg/dL — ABNORMAL HIGH (ref 70–99)

## 2023-02-03 NOTE — Progress Notes (Addendum)
[name], [name] ([id number]) [id number] Page 1 of 4 Visit Report for 02/02/2023 Arrival Information Details Patient Name: Date of Service: [name], [name] 02/02/2023 10:00 A M Medical Record Number: [id number] Patient Account Number: [id number] Date of Birth/Sex: Treating RN: 1956-10-30 (67 y.o. [name], [name] Primary Care [name]: [name] Other Clinician: Donavan [name] Referring [name]: Treating [name]/Extender: [name] in Treatment: 43 Visit Information History Since Last Visit All ordered tests and consults were completed: Yes Patient Arrived: Knee Scooter Added or deleted any medications: No Arrival Time: 09:52 Any new allergies or adverse reactions: No Accompanied By: self Had a fall or experienced change in No Transfer Assistance: None activities of daily living that may affect Patient Identification Verified: Yes risk of falls: Secondary Verification Process Completed: Yes Signs or symptoms of abuse/neglect since last visito No Patient Requires Transmission-Based Precautions: No Hospitalized since last visit: No Patient Has Alerts: No Implantable device outside of the clinic excluding No cellular tissue based products placed in the center since last visit: Pain Present Now: No Electronic Signature(s) Signed: 02/02/2023 1:05:30 PM By: [name] CHT EMT BS , , Entered By: [name] on 02/02/2023 13:05:30 -------------------------------------------------------------------------------- Clinic Level of Care Assessment Details Patient Name: Date of Service: [place], [name] 02/02/2023 10:00 A M Medical Record Number: [id number] Patient Account Number: [id number] Date of Birth/Sex: Treating RN: 1956/08/14 (67 y.o. [name] Primary Care [name]: [name] Other Clinician: Donavan [name] Referring [name]: Treating [name]/Extender: [name] in Treatment: 20 Clinic Level of Care Assessment Items TOOL 4 Quantity Score '[]'$  - 0 Use when only an EandM is performed on FOLLOW-UP visit ASSESSMENTS - Nursing Assessment / Reassessment '[]'$  - 0 Reassessment of Co-morbidities (includes updates in patient status) '[]'$  - 0 Reassessment of Adherence to Treatment Plan ASSESSMENTS - Wound and Skin A ssessment / Reassessment '[]'$  - 0 Simple Wound Assessment / Reassessment - one wound '[]'$  - 0 Complex Wound Assessment / Reassessment - multiple wounds '[]'$  - 0 Dermatologic / Skin Assessment (not related to wound area) ASSESSMENTS - Focused Assessment '[]'$  - 0 Circumferential Edema Measurements - multi extremities '[]'$  - 0 Nutritional Assessment / Counseling / Intervention '[]'$  - 0 Lower Extremity Assessment (monofilament, tuning fork, pulses) '[]'$  - 0 Peripheral Arterial Disease Assessment (using hand held doppler) ASSESSMENTS - Ostomy and/or Continence Assessment and Care '[]'$  - 0 Incontinence Assessment and Management '[]'$  - 0 Ostomy Care Assessment and Management (repouching, etc.) PROCESS - Coordination of Care '[]'$  - 0 Simple Patient / Family Education for ongoing care [name], [name] ([id number]) [id number] Page 2 of 4 '[]'$  - 0 Complex (extensive) Patient / Family Education for ongoing care '[]'$  - 0 Staff obtains Programmer, systems, Records, T Results / Process Orders est '[]'$  - 0 Staff telephones HHA, Nursing Homes / Clarify orders / etc '[]'$  - 0 Routine Transfer to another Facility (non-emergent condition) '[]'$  - 0 Routine Hospital Admission (non-emergent condition) '[]'$  - 0 New Admissions / Biomedical engineer / Ordering NPWT Apligraf, etc. , '[]'$  - 0 Emergency Hospital Admission (emergent condition) '[]'$  - 0 Simple Discharge Coordination '[]'$  - 0 Complex (extensive) Discharge Coordination PROCESS - Special Needs '[]'$  - 0 Pediatric / Minor Patient Management '[]'$  - 0 Isolation Patient  Management '[]'$  - 0 Hearing / Language / Visual special needs '[]'$  - 0 Assessment of Community assistance (transportation, D/C planning, etc.) '[]'$  - 0 Additional assistance / Altered mentation '[]'$  - 0 Support Surface(s) Assessment (bed, cushion, seat, etc.)  INTERVENTIONS - Wound Cleansing / Measurement '[]'$  - 0 Simple Wound Cleansing - one wound '[]'$  - 0 Complex Wound Cleansing - multiple wounds '[]'$  - 0 Wound Imaging (photographs - any number of wounds) '[]'$  - 0 Wound Tracing (instead of photographs) '[]'$  - 0 Simple Wound Measurement - one wound '[]'$  - 0 Complex Wound Measurement - multiple wounds INTERVENTIONS - Wound Dressings '[]'$  - 0 Small Wound Dressing one or multiple wounds '[]'$  - 0 Medium Wound Dressing one or multiple wounds '[]'$  - 0 Large Wound Dressing one or multiple wounds '[]'$  - 0 Application of Medications - topical '[]'$  - 0 Application of Medications - injection INTERVENTIONS - Miscellaneous '[]'$  - 0 External ear exam '[]'$  - 0 Specimen Collection (cultures, biopsies, blood, body fluids, etc.) '[]'$  - 0 Specimen(s) / Culture(s) sent or taken to Lab for analysis '[]'$  - 0 Patient Transfer (multiple staff / Civil Service fast streamer / Similar devices) '[]'$  - 0 Simple Staple / Suture removal (25 or less) '[]'$  - 0 Complex Staple / Suture removal (26 or more) X- 1 10 Hypo / Hyperglycemic Management (close monitor of Blood Glucose) '[]'$  - 0 Ankle / Brachial Index (ABI) - do not check if billed separately X- 1 5 Vital Signs Has the patient been seen at the hospital within the last three years: Yes Total Score: 15 Level Of Care: New/Established - Level 1 Electronic Signature(s) Signed: 02/04/2023 2:41:47 PM By: [name] CHT EMT BS , , Entered By: [name] on 02/02/2023 14:09:29 [name] ([id number] Page 3 of 4 -------------------------------------------------------------------------------- Encounter Discharge Information Details Patient Name:  Date of Service: [name], [name] 02/02/2023 10:00 A M Medical Record Number: [id number] Patient Account Number: [id number] Date of Birth/Sex: Treating RN: 1956-10-07 (67 y.o. [name] Primary Care Dylon Correa: [name] Other Clinician: Donavan [name] Referring Natassja Ollis: Treating Cassiopeia Florentino/Extender: [name] in Treatment: 20 Encounter Discharge Information Items Discharge Condition: Stable Ambulatory Status: Knee Scooter Discharge Destination: Home Transportation: Private Auto Accompanied By: self Schedule Follow-up Appointment: No Clinical Summary of Care: Electronic Signature(s) Signed: 02/02/2023 2:08:19 PM By: [name] CHT EMT BS , , Entered By: [name] on 02/02/2023 14:08:18 -------------------------------------------------------------------------------- General Visit Notes Details Patient Name: Date of Service: [name], [name]. 02/02/2023 10:00 A M Medical Record Number: [id number] Patient Account Number: [id number] Date of Birth/Sex: Treating RN: 25-Aug-1956 (67 y.o. [name] Primary Care Amina Menchaca: [name] Other Clinician: Donavan [name] Referring Vaidehi Braddy: Treating Wasil Wolke/Extender: [name] in Treatment: 20 Notes Patient arrived for hyperbaric treatment. Blood glucose level was measured at 91 mg/dL @ 1000. Patient stated that he ate a [employer] and drank orange juice in the parking lot. His blood glucose history for today is that his blood glucose was 287 approximately 0640 this morning and He self-administered "5 units fast- acting insulin" and "50 units slow-acting insulin." Patient was given an 8 oz Glucerna at the result of his blood glucose test. At approximately 1031, his blood glucose measurement was 152 mg/dL. After discussing with the patient about timing and whether he was going to undergo hyperbaric treatment today, he stated that he  was worried that his blood glucose would go down while in the chamber and did not want to do the treatment today. Electronic Signature(s) Signed: 02/02/2023 1:16:44 PM By: [name] CHT EMT BS , , Entered By: [name] on 02/02/2023 13:16:44 -------------------------------------------------------------------------------- Vitals Details Patient Name: Date of Service: [name], [name]. 02/02/2023 10:00  A M Medical Record Number: [id number] Patient Account Number: [id number] Date of Birth/Sex: Treating RN: [birth year] (66 y.o. [name] Primary Care Justyn Boyson: [name] Other Clinician: Valeria Batman Referring Lovelyn Sheeran: Treating Nayellie Sanseverino/Extender: [name] in Treatment: 20 Vital Signs Time Taken: 10:00 Temperature (F): 98.2 Height (in): 74 Pulse (bpm): 73 Weight (lbs): 245 Respiratory Rate (breaths/min): 18 Body Mass Index (BMI): 31.5 Blood Pressure (mmHg): 112/47 Capillary Blood Glucose (mg/dl): 91 Reference Range: 80 - 120 mg / dl Electronic Signature(s) Signed: 02/02/2023 1:07:06 PM By: [name] CHT EMT BS , , [name], [name] ([id number]) [id number] Page 4 of 4 Entered By: [name] on 02/02/2023 13:07:05

## 2023-02-03 NOTE — Progress Notes (Signed)
SCHAUN, DECARLI R ([id number]) [id number] Page 1 of 1 Visit Report for 02/02/2023 SuperBill Details Patient Name: Date of Service: [name], [name] [name] 02/02/2023 Medical Record Number: [id number] Patient Account Number: [id number] Date of Birth/Sex: Treating RN: 1956-02-08 (67 y.o. 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.523 Non-pressure chronic ulcer of other part of left foot with necrosis of muscle L98.492 Non-pressure chronic ulcer of skin of other sites with fat layer exposed L72.3 Sebaceous cyst [id number] Other chronic osteomyelitis, left ankle and foot E11.65 Type 2 diabetes mellitus with hyperglycemia E11.621 Type 2 diabetes mellitus with foot ulcer Facility Procedures The patient participates with Medicare or their insurance follows the Medicare Facility Guidelines CPT4 Code Description Modifier Quantity [id number] - WOUND CARE VISIT-LEV 1 EST PT 1 Electronic Signature(s) Signed: 02/02/2023 2:09:54 PM By: Donavan Burnet CHT EMT BS , , Signed: 02/02/2023 2:22:42 PM By: Fredirick Maudlin MD FACS Entered By: Donavan Burnet on 02/02/2023 14:09:52

## 2023-02-04 ENCOUNTER — Encounter (HOSPITAL_BASED_OUTPATIENT_CLINIC_OR_DEPARTMENT_OTHER): Payer: Medicare Other | Admitting: General Surgery

## 2023-02-04 DIAGNOSIS — E11621 Type 2 diabetes mellitus with foot ulcer: Secondary | ICD-10-CM | POA: Diagnosis not present

## 2023-02-04 LAB — GLUCOSE, CAPILLARY
Glucose-Capillary: 238 mg/dL — ABNORMAL HIGH (ref 70–99)
Glucose-Capillary: 245 mg/dL — ABNORMAL HIGH (ref 70–99)

## 2023-02-04 NOTE — Progress Notes (Signed)
[name], [name] ([id number]) [id number] Page 1 of 2 Visit Report for 02/03/2023 HBO Details Patient Name: Date of Service: [place], [place] [name]. 02/03/2023 10:00 A M Medical Record Number: [id number] Patient Account Number: [id number] Date of Birth/Sex: Treating RN: 25-Jun-1956 (67 y.o. 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 HBO Treatment Details Treatment Number: 52 Patient Type: Outpatient Chamber Type: Monoplace Chamber Serial #: [id number] Treatment Protocol: 2.5 ATA with 90 minutes oxygen, with two 5 minute air breaks Treatment Details Compression Rate Down: 2.0 psi / minute De-Compression Rate Up: 2.0 psi / minute A breaks and breathing ir Compress Tx Pressure periods Decompress Decompress Begins Reached (leave unused spaces Begins Ends blank) Chamber Pressure (ATA 1 2.5 2.5 2.5 2.5 2.5 - - 2.5 1 ) Clock Time (24 hr) 10:11 10:25 10:55 11:00 11:30 11:35 - - 12:05 12:17 Treatment Length: 126 (minutes) Treatment Segments: 4 Vital Signs Capillary Blood Glucose Reference Range: 80 - 120 mg / dl HBO Diabetic Blood Glucose Intervention Range: <131 mg/dl or >249 mg/dl Time Vitals Blood Respiratory Capillary Blood Glucose Pulse Action Type: Pulse: Temperature: Taken: Pressure: Rate: Glucose (mg/dl): Meter #: Oximetry (%) Taken: Pre 09:40 268 Post 12:22 159/72 82 18 98.2 264 Pre 10:03 145/69 89 20 98.7 Treatment Response Treatment Toleration: Well Treatment Completion Status: Treatment Completed without Adverse Event Treatment Notes The patient used Afrin before treatment. The  patient stated that he had egg, cheese, muffin with cream cheese before coming in today. Physician HBO Attestation: I certify that I supervised this HBO treatment in accordance with Medicare guidelines. A trained emergency response team is readily available per Yes hospital policies and procedures. Continue HBOT as ordered. Yes Electronic Signature(s) Signed: 02/03/2023 4:22:59 PM By: [name] Maudlin MD FACS Previous Signature: 02/03/2023 3:31:22 PM Version By: [name] EMT Previous Signature: 02/03/2023 3:29:42 PM Version By: [name] EMT Entered By: [name] on 02/03/2023 16:22:58 [name] ([id number].pdf Page 2 of 2 -------------------------------------------------------------------------------- HBO Safety Checklist Details Patient Name: Date of Service: [place], [place] [name]. 02/03/2023 10:00 A M Medical Record Number: [id number] Patient Account Number: [id number] Date of Birth/Sex: Treating RN: 1956-08-16 (67 y.o. [name] Primary Care Divante Kotch: [name] Other Clinician: Valeria [name] Referring Brad Mcgaughy: Treating Rayn Shorb/Extender: [name] in Treatment: 20 HBO Safety Checklist Items Safety Checklist Consent Form Signed Patient voided / foley secured and emptied When did you last eato 0700 Last dose of injectable or oral agent Yesterday Ostomy pouch emptied and vented if applicable NA All implantable devices assessed, documented and approved NA Intravenous access site secured and place NA Valuables secured Linens and cotton and cotton/polyester blend (less than 51% polyester) Personal oil-based products / skin lotions / body lotions removed Wigs or hairpieces removed NA Smoking or tobacco materials removed Books / newspapers / magazines / loose paper removed Cologne, aftershave, perfume and deodorant removed Jewelry removed (may wrap wedding band) Make-up  removed NA Hair care products removed Battery operated devices (external) removed Heating patches and chemical warmers removed Titanium eyewear removed NA Nail polish cured greater than 10 hours NA Casting material cured greater than 10 hours NA Hearing aids removed NA Loose dentures or partials removed removed by patient Prosthetics have been removed  NA Patient demonstrates correct use of air break device (if applicable) Patient concerns have been addressed Patient grounding bracelet on and cord attached to chamber Specifics for Inpatients (complete in addition to above) Medication sheet sent with patient NA Intravenous medications needed or due during therapy sent with patient NA Drainage tubes (e.g. nasogastric tube or chest tube secured and vented) NA Endotracheal or Tracheotomy tube secured NA Cuff deflated of air and inflated with saline NA Airway suctioned NA Notes The safety checklist was done before the treatment was started. Electronic Signature(s) Signed: 02/03/2023 2:20:34 PM By: [name] EMT Entered By: [name] on 02/03/2023 14:20:34

## 2023-02-04 NOTE — Progress Notes (Signed)
[name], [name] ([id number]) [id number] Page 1 of 2 Visit Report for 02/03/2023 Problem List Details Patient Name: Date of Service: [place], [name] 02/03/2023 10:00 A M Medical Record Number: [id number] Patient Account Number: [id number] Date of Birth/Sex: Treating RN: [birth year] (66 y.o. 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 L97.523 Non-pressure chronic ulcer of other part of left foot with 09/10/2022 No Yes necrosis of muscle L98.492 Non-pressure chronic ulcer of skin of other sites with fat 12/17/2022 No Yes layer exposed L72.3 Sebaceous cyst 12/17/2022 No Yes [id number] Other chronic osteomyelitis, left ankle and foot 09/10/2022 No Yes E11.65 Type 2 diabetes mellitus with hyperglycemia 09/10/2022 No Yes E11.621 Type 2 diabetes mellitus with foot ulcer 09/10/2022 No Yes Inactive Problems Resolved Problems Electronic Signature(s) Signed: 02/03/2023 3:32:04 PM By: Valeria Batman EMT Signed: 02/03/2023 4:22:25 PM By: Fredirick Maudlin MD FACS Entered By: Valeria Batman on 02/03/2023 15:32:04 SuperBill Details -------------------------------------------------------------------------------- [name] ([id number]) [id number] Page 2 of 2 Patient Name: Date of Service: [name], [name] [name] 02/03/2023 Medical Record Number: [id number] Patient Account Number: [id number] Date of Birth/Sex: Treating RN: 1956-05-20 (67 y.o. 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.523 Non-pressure chronic ulcer of other part of left foot with necrosis of muscle L98.492 Non-pressure chronic ulcer of skin of other sites with fat layer exposed L72.3 Sebaceous cyst [id number] Other chronic osteomyelitis, left ankle and foot E11.65 Type 2 diabetes mellitus with hyperglycemia E11.621 Type 2 diabetes mellitus with foot ulcer Facility Procedures The patient participates with Medicare or their insurance follows the Medicare Facility Guidelines CPT4 Code Description Modifier Quantity [id number] G0277-(Facility Use Only) HBOT full body chamber, 28mn , 4 ICD-10 Diagnosis Description [phone] chronic osteomyelitis, 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 Physician Procedures Quantity CPT4 Code Description Modifier [id number] PHYS HYPERBARIC OXYGEN THERAPY 1 ICD-10 Diagnosis Description 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, 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

## 2023-02-04 NOTE — Progress Notes (Signed)
RIZWAN, MAMMONE R ([id number]) [id number] Page 1 of 2 Visit Report for 02/03/2023 Arrival Information Details Patient Name: Date of Service: [place], [name] 02/03/2023 10:00 A M Medical Record Number: [id number] Patient Account Number: [id number] Date of Birth/Sex: Treating RN: 21-Jan-1956 (68 y.o. [name] Primary Care Anselma Herbel: Nelda Bucks Other Clinician: Valeria Batman Referring Jaequan Propes: Treating Megha Agnes/Extender: Doristine Bosworth in Treatment: 70 Visit Information History Since Last Visit All ordered tests and consults were completed: Yes Patient Arrived: Knee Scooter Added or deleted any medications: No Arrival Time: 09:33 Any new allergies or adverse reactions: No Accompanied By: None Had a fall or experienced change in No Transfer Assistance: None activities of daily living that may affect Patient Identification Verified: Yes risk of falls: Secondary Verification Process Completed: Yes Signs or symptoms of abuse/neglect since last visito No Patient Requires Transmission-Based Precautions: No Hospitalized since last visit: No Patient Has Alerts: No Implantable device outside of the clinic excluding No cellular tissue based products placed in the center since last visit: Pain Present Now: No Electronic Signature(s) Signed: 02/03/2023 2:14:21 PM By: Valeria Batman EMT Entered By: Valeria Batman on 02/03/2023 14:14:21 -------------------------------------------------------------------------------- Encounter Discharge Information Details Patient Name: Date of Service: [place], [name] 02/03/2023 10:00 A M Medical Record Number: [id number] Patient Account Number: [id number] Date of Birth/Sex: Treating RN: [birth year] (67 y.o. [name] Primary Care Babara Buffalo: Nelda Bucks Other Clinician: Valeria Batman Referring Heriberto Stmartin: Treating Shuntel Fishburn/Extender: Doristine Bosworth in  Treatment: 20 Encounter Discharge Information Items Discharge Condition: Stable Ambulatory Status: Knee Scooter Discharge Destination: Home Transportation: Private Auto Accompanied By: None Schedule Follow-up Appointment: Yes Clinical Summary of Care: Electronic Signature(s) Signed: 02/03/2023 3:32:35 PM By: Valeria Batman EMT Entered By: Valeria Batman on 02/03/2023 15:32:35 [name] ([id number]) [id number] Page 2 of 2 -------------------------------------------------------------------------------- Vitals Details Patient Name: Date of Service: [place], [name] 02/03/2023 10:00 A M Medical Record Number: [id number] Patient Account Number: [id number] Date of Birth/Sex: Treating RN: [birth year] (67 y.o. [name] Primary Care Damani Rando: Nelda Bucks Other Clinician: Valeria Batman Referring Tyjae Issa: Treating Churchill Grimsley/Extender: Doristine Bosworth in Treatment: 20 Vital Signs Time Taken: 09:40 Capillary Blood Glucose (mg/dl): 268 Height (in): 74 Reference Range: 80 - 120 mg / dl Weight (lbs): 245 Body Mass Index (BMI): 31.5 Electronic Signature(s) Signed: 02/03/2023 2:16:22 PM By: Valeria Batman EMT Entered By: Valeria Batman on 02/03/2023 14:16:21

## 2023-02-05 NOTE — Progress Notes (Signed)
JENKINS, NINNEMAN [name] ([id number]) [id number] Page 1 of 2 Visit Report for 02/04/2023 Arrival Information Details Patient Name: Date of Service: [name], [name] 02/04/2023 10:00 A M Medical Record Number: [id number] Patient Account Number: [id number] Date of Birth/Sex: Treating RN: 12/30/1955 (67 y.o. [name] Primary Care Laruen Risser: Nelda Bucks Other Clinician: Donavan Burnet Referring Chizara Mena: Treating Edmonia Gonser/Extender: Doristine Bosworth in Treatment: 21 Visit Information History Since Last Visit All ordered tests and consults were completed: Yes Patient Arrived: Knee Scooter Added or deleted any medications: No Arrival Time: 09:37 Any new allergies or adverse reactions: No Accompanied By: self Had a fall or experienced change in No Transfer Assistance: None activities of daily living that may affect Patient Identification Verified: Yes risk of falls: Secondary Verification Process Completed: Yes Signs or symptoms of abuse/neglect since last visito No Patient Requires Transmission-Based Precautions: No Hospitalized since last visit: No Patient Has Alerts: No Implantable device outside of the clinic excluding No cellular tissue based products placed in the center since last visit: Pain Present Now: No Electronic Signature(s) Signed: 02/04/2023 2:04:02 PM By: Donavan Burnet CHT EMT BS , , Entered By: Donavan Burnet on 02/04/2023 14:04:02 -------------------------------------------------------------------------------- Encounter Discharge Information Details Patient Name: Date of Service: [name], [name] 02/04/2023 10:00 A M Medical Record Number: [id number] Patient Account Number: [id number] Date of Birth/Sex: Treating RN: 1955/12/20 (67 y.o. [name] Primary Care Scotty Pinder: Nelda Bucks Other Clinician: Donavan Burnet Referring Itzell Bendavid: Treating Avari Gelles/Extender: Doristine Bosworth in Treatment: 21 Encounter Discharge Information Items Discharge Condition: Stable Ambulatory Status: Knee Scooter Discharge Destination: Home Transportation: Private Auto Accompanied By: self Schedule Follow-up Appointment: No Clinical Summary of Care: Electronic Signature(s) Signed: 02/04/2023 2:09:02 PM By: Donavan Burnet CHT EMT BS , , Previous Signature: 02/04/2023 2:08:49 PM Version By: Donavan Burnet CHT EMT BS , , Entered By: Donavan Burnet on 02/04/2023 14:09:02 -------------------------------------------------------------------------------- Vitals Details Patient Name: Date of Service: [name] [name], [name] 02/04/2023 10:00 A M Medical Record Number: [id number] Patient Account Number: [id number] Date of Birth/Sex: Treating RN: 05-Aug-1956 (67 y.o. [name] Primary Care Jasaiah Karwowski: Nelda Bucks Other Clinician: Donavan Burnet Referring Amazing Cowman: Treating Mayo Faulk/Extender: Doristine Bosworth in Treatment: 21 Vital Signs Time Taken: 09:43 Temperature (F): 98.9 [name], [name] ([id number]) [id number] Page 2 of 2 Height (in): 74 Pulse (bpm): 80 Weight (lbs): 245 Respiratory Rate (breaths/min): 20 Body Mass Index (BMI): 31.5 Blood Pressure (mmHg): 128/68 Capillary Blood Glucose (mg/dl): 238 Reference Range: 80 - 120 mg / dl Electronic Signature(s) Signed: 02/04/2023 2:05:43 PM By: Donavan Burnet CHT EMT BS , , Previous Signature: 02/04/2023 2:05:23 PM Version By: Donavan Burnet CHT EMT BS , , Entered By: Donavan Burnet on 02/04/2023 14:05:43

## 2023-02-05 NOTE — Progress Notes (Signed)
STAMATIOS, [name] ([id number]) [id number] Page 1 of 2 Visit Report for 02/04/2023 HBO Details Patient Name: Date of Service: [name], [name]. 02/04/2023 10:00 A M Medical Record Number: [id number] Patient Account Number: [id number] Date of Birth/Sex: Treating RN: [birth year] (67 y.o. 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 HBO Treatment Details Treatment Number: 53 Patient Type: Outpatient Chamber Type: Monoplace Chamber Serial #: [id number] Treatment Protocol: 2.5 ATA with 90 minutes oxygen, with two 5 minute air breaks Treatment Details Compression Rate Down: 1.5 psi / minute De-Compression Rate Up: 2.0 psi / minute A breaks and breathing ir Compress Tx Pressure periods Decompress Decompress Begins Reached (leave unused spaces Begins Ends blank) Chamber Pressure (ATA 1 2.5 2.5 2.5 2.5 2.5 - - 2.5 1 ) Clock Time (24 hr) 10:17 10:30 11:00 11:05 11:35 11:40 - - 12:10 12:23 Treatment Length: 126 (minutes) Treatment Segments: 4 Vital Signs Capillary Blood Glucose Reference Range: 80 - 120 mg / dl HBO Diabetic Blood Glucose Intervention Range: <131 mg/dl or >249 mg/dl Type: Time Vitals Blood Respiratory Capillary Blood Glucose Pulse Action Pulse: Temperature: Taken: Pressure: Rate: Glucose (mg/dl): Meter #: Oximetry (%) Taken: Pre 09:43 128/68 80 20 98.9 238 1 none per protocol Post 12:26 151/70 72 18 98.8 245 1 none per protocol Treatment Response Treatment Toleration: Well Treatment Completion Status: Treatment Completed without Adverse Event Treatment Notes Patient arrived,  blood glucose level checked with result of 238 mg/dL. Patient stated that his blood glucose was fairly high this morning. Patient self- administered "60 units slow-acting" insulin and "6 units of fast-acting" insulin at 0715. Patient ate eggs, english muffin for breakfast at [facility]. Patient dressed for treatment. After performing safety check, Mr. [name] was placed in the chamber travelling at 1 psi/min until he confirmed that his ears were equalizing normally. Patient tolerated treatment and subsequent decompression at the rate of 2 psi/min. He was stable upon discharge. Physician HBO Attestation: I certify that I supervised this HBO treatment in accordance with Medicare guidelines. A trained emergency response team is readily available per Yes hospital policies and procedures. Continue HBOT as ordered. Yes Electronic Signature(s) Signed: 02/04/2023 3:29:13 PM By: [name] Previous Signature: 02/04/2023 2:21:51 PM Version By: [name] CHT EMT BS , , Previous Signature: 02/04/2023 2:07:53 PM Version By: [name] CHT EMT BS , , Entered By: [name] on 02/04/2023 15:29:13 -------------------------------------------------------------------------------- HBO Safety Checklist Details Patient Name: Date of Service: [name], [name] 02/04/2023 10:00 A M Medical Record Number: [id number] Patient Account Number: [id number] Date of Birth/Sex: Treating RN: 1956/02/12 (67 y.o. [name], [place] ([id number]) [id number] Page 2 of 2 Primary Care [name]: [name] Other Clinician: Donavan [name] Referring [name]: Treating [name]/Extender: Doristine [name] in Treatment: 21 HBO Safety Checklist Items Safety Checklist Consent Form Signed Patient voided / foley secured and emptied When did you last eato 0745 Last dose of injectable or oral agent 0715 Ostomy pouch emptied and vented if  applicable NA All implantable devices assessed, documented and approved NA Intravenous access site secured and place NA Valuables secured Linens and cotton and cotton/polyester blend (less than 51% polyester) Personal oil-based products / skin lotions / body lotions removed Wigs or  hairpieces removed NA Smoking or tobacco materials removed NA Books / newspapers / magazines / loose paper removed Cologne, aftershave, perfume and deodorant removed Jewelry removed (may wrap wedding band) Make-up removed NA Hair care products removed Battery operated devices (external) removed Heating patches and chemical warmers removed Titanium eyewear removed Nail polish cured greater than 10 hours NA Casting material cured greater than 10 hours NA Hearing aids removed NA Loose dentures or partials removed dentures removed Prosthetics have been removed NA Patient demonstrates correct use of air break device (if applicable) Patient concerns have been addressed Patient grounding bracelet on and cord attached to chamber Specifics for Inpatients (complete in addition to above) Medication sheet sent with patient NA Intravenous medications needed or due during therapy sent with patient NA Drainage tubes (e.g. nasogastric tube or chest tube secured and vented) NA Endotracheal or Tracheotomy tube secured NA Cuff deflated of air and inflated with saline NA Airway suctioned NA Notes The safety checklist was done before the treatment was started. Electronic Signature(s) Signed: 02/04/2023 2:06:45 PM By: [name] CHT EMT BS , , Entered By: [name] on 02/04/2023 14:06:45

## 2023-02-05 NOTE — Progress Notes (Signed)
FORBES, PARZIALE R ([id number]) [id number] Page 1 of 1 Visit Report for 02/04/2023 SuperBill Details Patient Name: Date of Service: [name], [name] [name] 02/04/2023 Medical Record Number: [id number] Patient Account Number: [id number] Date of Birth/Sex: Treating RN: 1956/10/18 (67 y.o. 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.523 Non-pressure chronic ulcer of other part of left foot with necrosis of muscle L98.492 Non-pressure chronic ulcer of skin of other sites with fat layer exposed L72.3 Sebaceous cyst [id number] Other chronic osteomyelitis, left ankle and foot E11.65 Type 2 diabetes mellitus with hyperglycemia E11.621 Type 2 diabetes mellitus with foot ulcer Facility Procedures The patient participates with Medicare or their insurance follows the Medicare Facility Guidelines CPT4 Code Description Modifier Quantity [id number] G0277-(Facility Use Only) HBOT full body chamber, 34mn , 4 ICD-10 Diagnosis Description [id number] chronic osteomyelitis, 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 Physician Procedures Quantity CPT4 Code Description Modifier [id number] PHYS HYPERBARIC OXYGEN THERAPY 1 ICD-10 Diagnosis Description 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, 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

## 2023-02-07 ENCOUNTER — Encounter (HOSPITAL_BASED_OUTPATIENT_CLINIC_OR_DEPARTMENT_OTHER): Payer: Medicare Other | Admitting: General Surgery

## 2023-02-07 DIAGNOSIS — E11621 Type 2 diabetes mellitus with foot ulcer: Secondary | ICD-10-CM | POA: Diagnosis not present

## 2023-02-07 LAB — GLUCOSE, CAPILLARY
Glucose-Capillary: 193 mg/dL — ABNORMAL HIGH (ref 70–99)
Glucose-Capillary: 160 mg/dL — ABNORMAL HIGH (ref 70–99)

## 2023-02-07 NOTE — Progress Notes (Signed)
JARIN, HALLQUIST R ([id number]) [id number] Page 1 of 2 Visit Report for 02/07/2023 Arrival Information Details Patient Name: Date of Service: [place], [name] 02/07/2023 10:00 A M Medical Record Number: [id number] Patient Account Number: [id number] Date of Birth/Sex: Treating RN: 11-23-1956 (67 y.o. [name], [name] Primary Care Eris Hannan: Nelda Bucks Other Clinician: Valeria Batman Referring Shaquoia Miers: Treating Moyses Pavey/Extender: Doristine Bosworth in Treatment: 21 Visit Information History Since Last Visit All ordered tests and consults were completed: Yes Patient Arrived: Ambulatory Added or deleted any medications: No Arrival Time: 08:39 Any new allergies or adverse reactions: No Accompanied By: None Had a fall or experienced change in No Transfer Assistance: None activities of daily living that may affect Patient Identification Verified: Yes risk of falls: Secondary Verification Process Completed: Yes Signs or symptoms of abuse/neglect since last visito No Patient Requires Transmission-Based Precautions: No Hospitalized since last visit: No Patient Has Alerts: No Implantable device outside of the clinic excluding No cellular tissue based products placed in the center since last visit: Pain Present Now: No Electronic Signature(s) Signed: 02/07/2023 1:21:28 PM By: Valeria Batman EMT Entered By: Valeria Batman on 02/07/2023 13:21:28 -------------------------------------------------------------------------------- Encounter Discharge Information Details Patient Name: Date of Service: [name], [name] 02/07/2023 10:00 A M Medical Record Number: [id number] Patient Account Number: [id number] Date of Birth/Sex: Treating RN: 1956-02-08 (67 y.o. [name] Primary Care Taliah Porche: Nelda Bucks Other Clinician: Valeria Batman Referring Divante Kotch: Treating Merick Kelleher/Extender: Doristine Bosworth in  Treatment: 21 Encounter Discharge Information Items Discharge Condition: Stable Ambulatory Status: Ambulatory Discharge Destination: Home Transportation: Private Auto Accompanied By: None Schedule Follow-up Appointment: Yes Clinical Summary of Care: Electronic Signature(s) Signed: 02/07/2023 1:33:56 PM By: Valeria Batman EMT Entered By: Valeria Batman on 02/07/2023 13:33:56 -------------------------------------------------------------------------------- Vitals Details Patient Name: Date of Service: [name], [name] 02/07/2023 10:00 A M Medical Record Number: [id number] Patient Account Number: [id number] Date of Birth/Sex: Treating RN: [birth year] (67 y.o. [name] Primary Care Jozlyn Schatz: Nelda Bucks Other Clinician: Valeria Batman Referring Skippy Marhefka: Treating Gerell Fortson/Extender: Doristine Bosworth in Treatment: 21 Vital Signs Time Taken: 09:43 Temperature (F): 98.1 CEFERINO, BLUMENSTOCK R ([id number]) [id number] Page 2 of 2 Height (in): 74 Pulse (bpm): 91 Weight (lbs): 245 Respiratory Rate (breaths/min): 18 Body Mass Index (BMI): 31.5 Blood Pressure (mmHg): 124/69 Capillary Blood Glucose (mg/dl): 193 Reference Range: 80 - 120 mg / dl Electronic Signature(s) Signed: 02/07/2023 1:22:46 PM By: Valeria Batman EMT Entered By: Valeria Batman on 02/07/2023 13:22:46

## 2023-02-07 NOTE — Progress Notes (Signed)
ICHOLAS, WEARE R ([id number]) [id number] Page 1 of 2 Visit Report for 02/07/2023 Problem List Details Patient Name: Date of Service: [facility], [name] 02/07/2023 10:00 A M Medical Record Number: [id number] Patient Account Number: [id number] Date of Birth/Sex: Treating RN: 1956/08/11 (67 y.o. 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, left ankle and foot 09/10/2022 No Yes E11.65 Type 2 diabetes mellitus with hyperglycemia 09/10/2022 No Yes E11.621 Type 2 diabetes mellitus with foot ulcer 09/10/2022 No Yes Inactive Problems ICD-10 Code Description Active Date Inactive Date L72.3 Sebaceous cyst 12/17/2022 12/17/2022 Resolved Problems ICD-10 Code Description Active Date Resolved Date L98.492 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 02/07/2023 Medical Record Number: [id number] Patient Account Number: [id number] Date of Birth/Sex: Treating RN: [birth year] (67 y.o. 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, left ankle and foot E11.65 Type 2 diabetes mellitus with hyperglycemia E11.621 Type 2 diabetes mellitus with foot ulcer Facility Procedures : The patient participates with Medicare or their insurance follows the Medicare Facility Guidelines: CPT4 Code Description Modifier Quantity [id number]-(Facility Use Only) HBOT full body chamber, 37mn , 4 ICD-10 Diagnosis Description [id number]  chronic osteomyelitis, 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 2 diabetes mellitus with foot  ulcer Physician Procedures : CPT4 Code Description Modifier [id number] PHYS HYPERBARIC OXYGEN THERAPY ICD-10 Diagnosis Description 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

## 2023-02-07 NOTE — Progress Notes (Signed)
EMITERIO, NAU R ([id number]) [id number] Page 1 of 2 Visit Report for 02/07/2023 HBO Details Patient Name: Date of Service: [place], [name] 02/07/2023 10:00 A M Medical Record Number: [id number] Patient Account Number: [id number] Date of Birth/Sex: Treating RN: Apr 28, 1956 (67 y.o. 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 HBO Treatment Details Treatment Number: 54 Patient Type: Outpatient Chamber Type: Monoplace Chamber Serial #: [id number] Treatment Protocol: 2.5 ATA with 90 minutes oxygen, with two 5 minute air breaks Treatment Details Compression Rate Down: 2.0 psi / minute De-Compression Rate Up: 2.0 psi / minute A breaks and breathing ir Compress Tx Pressure periods Decompress Decompress Begins Reached (leave unused spaces Begins Ends blank) Chamber Pressure (ATA 1 2.5 2.5 2.5 2.5 2.5 - - 2.5 1 ) Clock Time (24 hr) 10:18 10:31 11:01 11:06 11:36 11:41 - - 12:11 12:19 Treatment Length: 121 (minutes) Treatment Segments: 4 Vital Signs Capillary Blood Glucose Reference Range: 80 - 120 mg / dl HBO Diabetic Blood Glucose Intervention Range: <131 mg/dl or >249 mg/dl Time Vitals Blood Respiratory Capillary Blood Glucose Pulse Action Type: Pulse: Temperature: Taken: Pressure: Rate: Glucose (mg/dl): Meter #: Oximetry (%) Taken: Pre 09:43 124/69 91 18 98.1 193 Post 12:28 138/73 83 18 98.1 160 Treatment Response Treatment Toleration: Well Treatment Completion Status: Treatment Completed without Adverse Event Physician HBO Attestation: I certify that I supervised this HBO treatment in  accordance with Medicare guidelines. A trained emergency response team is readily available per Yes hospital policies and procedures. Continue HBOT as ordered. Yes Electronic Signature(s) Signed: 02/07/2023 1:43:59 PM By: Fredirick Maudlin MD FACS Previous Signature: 02/07/2023 1:27:58 PM Version By: Valeria Batman EMT Entered By: Fredirick Maudlin on 02/07/2023 13:43:58 -------------------------------------------------------------------------------- HBO Safety Checklist Details Patient Name: Date of Service: [place], [name] 02/07/2023 10:00 A M Medical Record Number: [id number] Patient Account Number: [id number] Date of Birth/Sex: Treating RN: 28-Jan-1956 (68 y.o. [name] Primary Care Kimimila Tauzin: Nelda Bucks Other Clinician: Valeria Batman Referring Cambre Matson: Treating Shirle Provencal/Extender: Doristine Bosworth in Treatment: [facility] Items Safety Checklist KOICHI, CROCITTO R ([id number]) [id number] Page 2 of 2 Consent Form Signed Patient voided / foley secured and emptied When did you last eato 0800 Last dose of injectable or oral agent Yesterday Ostomy pouch emptied and vented if applicable NA All implantable devices assessed, documented and approved NA Intravenous access site secured and place NA Valuables secured Linens and cotton and cotton/polyester blend (less than 51% polyester) Personal oil-based products / skin lotions / body lotions removed Wigs or hairpieces removed NA Smoking or tobacco materials removed Books / newspapers / magazines / loose paper removed Cologne, aftershave, perfume and deodorant removed Jewelry removed (may wrap wedding band) Make-up removed NA Hair care products removed NA Battery operated devices (external) removed wound vac Heating patches and chemical warmers removed Titanium eyewear removed NA Nail polish cured greater than 10 hours NA Casting material cured greater  than 10 hours NA Hearing aids removed NA Loose dentures or partials removed removed by patient Prosthetics have been removed NA Patient demonstrates correct use of air break device (if applicable) Patient concerns have been addressed Patient grounding bracelet on and cord attached to chamber Specifics for Inpatients (complete in addition to above)  Medication sheet sent with patient NA Intravenous medications needed or due during therapy sent with patient NA Drainage tubes (e.g. nasogastric tube or chest tube secured and vented) NA Endotracheal or Tracheotomy tube secured NA Cuff deflated of air and inflated with saline NA Airway suctioned NA Notes The safety checklist was done before the treatment was started. Electronic Signature(s) Signed: 02/07/2023 1:24:23 PM By: Valeria Batman EMT Entered By: Valeria Batman on 02/07/2023 13:24:23

## 2023-02-08 ENCOUNTER — Encounter (HOSPITAL_BASED_OUTPATIENT_CLINIC_OR_DEPARTMENT_OTHER): Payer: Medicare Other | Admitting: General Surgery

## 2023-02-08 NOTE — Progress Notes (Signed)
[name] [name] ([id number]) [id number] Page 1 of 10 Visit Report for 02/07/2023 Arrival Information Details Patient Name: Date of Service: [name], [name]. 02/07/2023 8:30 A M Medical Record Number: [id number] Patient Account Number: [id number] Date of Birth/Sex: Treating RN: [birth year] (67 y.o. [name], [place] Primary Care Fleet Higham: Nelda Bucks Other Clinician: Referring Kip Cropp: Treating Coley Kulikowski/Extender: Doristine Bosworth in Treatment: 21 Visit Information History Since Last Visit Added or deleted any medications: No Patient Arrived: Ambulatory Any new allergies or adverse reactions: No Arrival Time: 08:41 Had a fall or experienced change in No Accompanied By: self activities of daily living that may affect Transfer Assistance: None risk of falls: Patient Identification Verified: Yes Signs or symptoms of abuse/neglect since last visito No Secondary Verification Process Completed: Yes Hospitalized since last visit: No Patient Requires Transmission-Based Precautions: No Implantable device outside of the clinic excluding No Patient Has Alerts: No cellular tissue based products placed in the center since last visit: Has Dressing in Place as Prescribed: Yes Pain Present Now: No Electronic Signature(s) Signed: 02/07/2023 4:56:09 PM By: Blanche East RN Entered By: Blanche East on 02/07/2023 08:41:27 -------------------------------------------------------------------------------- Encounter Discharge Information Details Patient Name: Date of Service: [name], [name]. 02/07/2023 8:30 A M Medical Record Number: [id number] Patient Account Number: [id number] Date of Birth/Sex: Treating RN: [birth year] (66 y.o. [name] Primary Care Jodi Kappes: Nelda Bucks Other Clinician: Referring Kei Mcelhiney: Treating Yarima Penman/Extender: Doristine Bosworth in Treatment: 21 Encounter Discharge Information Items  Post Procedure Vitals Discharge Condition: Stable Temperature (F): 97.7 Ambulatory Status: Ambulatory Pulse (bpm): 96 Discharge Destination: Home Respiratory Rate (breaths/min): 18 Transportation: Private Auto Blood Pressure (mmHg): 132/68 Accompanied By: self Schedule Follow-up Appointment: Yes Clinical Summary of Care: Electronic Signature(s) Signed: 02/07/2023 10:09:05 AM By: Blanche East RN Entered By: Blanche East on 02/07/2023 10:09:05 -------------------------------------------------------------------------------- Lower Extremity Assessment Details Patient Name: Date of Service: [name], [name]. 02/07/2023 8:30 A M Medical Record Number: [id number] Patient Account Number: [id number] Date of Birth/Sex: Treating RN: 1956/01/27 (67 y.o. [name] Primary Care Lou Irigoyen: Nelda Bucks Other Clinician: Referring Anijah Spohr: Treating Devan Babino/Extender: Johnnette Gourd Weeks in Treatment: 21 Edema Assessment Assessed: [name]: No] [name]: No] S[LeftRYNELL, [name] [name] [id number] [[id number] Page 2 of 10] Edema: [Left: Ye] [Right: s] Calf Left: Right: Point of Measurement: From Medial Instep 47 cm Ankle Left: Right: Point of Measurement: From Medial Instep 24.6 cm Vascular Assessment Pulses: Dorsalis Pedis Palpable: [Left:Yes] Electronic Signature(s) Signed: 02/07/2023 4:56:09 PM By: Blanche East RN Entered By: Blanche East on 02/07/2023 08:51:19 -------------------------------------------------------------------------------- Multi Wound Chart Details Patient Name: Date of Service: [name], [name]. 02/07/2023 8:30 A M Medical Record Number: [id number] Patient Account Number: [id number] Date of Birth/Sex: Treating RN: 1956/01/09 (67 y.o. M) Primary Care Kabria Hetzer: Nelda Bucks Other Clinician: Referring Ellory Khurana: Treating Honestie Kulik/Extender: Doristine Bosworth in Treatment: 21 Vital  Signs Height(in): 74 Capillary Blood Glucose(mg/dl): 179 Weight(lbs): 245 Pulse(bpm): 60 Body Mass Index(BMI): 31.5 Blood Pressure(mmHg): 146/80 Temperature(F): 97.6 Respiratory Rate(breaths/min): 18 [1:Photos:] [2:No Photos] Left Calcaneus Left, Dorsal Foot Right Shoulder Wound Location: Pressure Injury Pressure Injury Gradually Appeared Wounding Event: Diabetic Wound/Ulcer of the Lower Diabetic Wound/Ulcer of the Lower Cyst Primary Etiology: Extremity Extremity Cataracts, Sleep Apnea, Hypertension, Cataracts, Sleep Apnea, Hypertension, Cataracts, Sleep Apnea, Hypertension, Comorbid History: Type II Diabetes, Osteomyelitis, Type II Diabetes, Osteomyelitis, Type II Diabetes, Osteomyelitis, Neuropathy Neuropathy Neuropathy 03/09/2019 07/09/2022 12/14/2022 Date Acquired: '21 21 7 '$ Weeks of Treatment: Open Open Healed -  Epithelialized Wound Status: No No No Wound Recurrence: 7.2x4.5x0.3 2x2.5x0.1 0x0x0 Measurements L x W x D (cm) 25.447 3.927 0 A (cm) : rea 7.634 0.393 0 Volume (cm) : -17.80% 58.30% 93.70% % Reduction in Area: 64.70% 58.30% 96.00% % Reduction in Volume: Grade 3 Grade 2 Full Thickness Without Exposed Classification: Support Structures Large Medium Medium Exudate Amount: Serosanguineous Serosanguineous Serosanguineous Exudate Type: red, brown red, brown red, brown Exudate Color: Thickened Flat and Intact Distinct, outline attached Wound Margin: Medium (34-66%) Medium (34-66%) Large (67-100%) Granulation Amount: Red, Pale Red, Pink N/A Granulation Quality: Medium (34-66%) Medium (34-66%) Small (1-33%) Necrotic Amount: [name] ([id number]) [phone].pdf Page 3 of 10 Eschar, Adherent [place] Eschar Necrotic Tissue: Fat Layer (Subcutaneous Tissue): Yes Fat Layer (Subcutaneous Tissue): Yes Fat Layer (Subcutaneous Tissue): Yes Exposed Structures: Bone: Yes Fascia: No Fascia: No Fascia: No Tendon:  No Tendon: No Tendon: No Muscle: No Muscle: No Muscle: No Joint: No Joint: No Joint: No Bone: No Bone: No Small (1-33%) Small (1-33%) Small (1-33%) Epithelialization: Debridement - Excisional N/A N/A Debridement: Pre-procedure Verification/Time Out 09:03 N/A N/A Taken: Callus, Subcutaneous, Slough N/A N/A Tissue Debrided: Skin/Subcutaneous Tissue N/A N/A [name]: 32.4 N/A N/A Debridement A (sq cm): rea Curette N/A N/A Instrument: Minimum N/A N/A Bleeding: Pressure N/A N/A Hemostasis A chieved: 0 N/A N/A Procedural Pain: 0 N/A N/A Post Procedural Pain: Procedure was tolerated well N/A N/A Debridement Treatment Response: 7.2x4.5x0.1 N/A N/A Post Debridement Measurements L x W x D (cm) 2.545 N/A N/A Post Debridement Volume: (cm) Callus: Yes Scarring: Yes Rash: Yes Periwound Skin Texture: Maceration: Yes Maceration: Yes Dry/Scaly: Yes Periwound Skin Moisture: Dry/Scaly: No Dry/Scaly: No Maceration: No No Abnormalities Noted No Abnormalities Noted No Abnormalities Noted Periwound Skin Color: No Abnormality No Abnormality No Abnormality Temperature: Debridement N/A N/A Procedures Performed: Negative Pressure Wound Therapy Maintenance (NPWT) Treatment Notes Electronic Signature(s) Signed: 02/07/2023 9:27:59 AM By: Fredirick Maudlin MD FACS Entered By: Fredirick Maudlin on 02/07/2023 09:27:58 -------------------------------------------------------------------------------- Multi-Disciplinary Care Plan Details Patient Name: Date of Service: [name], [name]. 02/07/2023 8:30 A M Medical Record Number: [id number] Patient Account Number: [id number] Date of Birth/Sex: Treating RN: 04-14-1956 (67 y.o. 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 and treatment procedures Potential for barotraumas to ears, sinuses, teeth, and lungs or cerebral gas embolism related to changes in atmospheric pressure inside hyperbaric oxygen chamber Potential for oxygen toxicity seizures related to delivery of 100% oxygen at an increased atmospheric pressure Potential for pulmonary oxygen toxicity related to delivery of 100% oxygen at an increased atmospheric pressure Goals: Barotrauma will be prevented during HBO2 Date Initiated: 10/06/2022 Target Resolution Date: 01/14/2023 Goal Status: Active Patient and/or family will be able to state/discuss factors appropriate to the management of their disease process during treatment Date Initiated: 10/06/2022 Target Resolution Date: 01/14/2023 Goal Status: Active Patient will tolerate the hyperbaric oxygen therapy treatment Date Initiated: 10/06/2022 Target Resolution Date: 01/14/2023 Goal Status: Active Patient/caregiver will verbalize understanding of HBO goals, rationale, procedures and potential hazards Date Initiated: 10/06/2022 Target Resolution Date: 01/14/2023 Goal Status: Active SERAFIM, KIESEWETTER ([id number]) [id number] Page 4 of 10 Interventions: Administer decongestants, per physician orders, prior to HBO2 Administer the correct therapeutic gas delivery based on the patients needs and limitations, per physician order Assess and provide for patients comfort  related to the hyperbaric environment and equalization of middle ear Assess for signs and symptoms related to adverse events, including but not limited to confinement anxiety, pneumothorax, oxygen toxicity and baurotrauma Notes: Wound/Skin Impairment Nursing Diagnoses: Impaired tissue integrity Goals: Patient/caregiver will verbalize understanding of skin care regimen Date Initiated: 10/06/2022 Target Resolution Date: 03/10/2023 Goal Status:  Active Ulcer/skin breakdown will have a volume reduction of 30% by week 4 Date Initiated: 09/10/2022 Date Inactivated: 10/06/2022 Target Resolution Date: 10/08/2022 Goal Status: Unmet Unmet Reason: osteo, HBOT Ulcer/skin breakdown will have a volume reduction of 50% by week 8 Date Initiated: 10/06/2022 Target Resolution Date: 01/14/2023 Goal Status: Active Interventions: Assess ulceration(s) every visit Provide education on ulcer and skin care Treatment Activities: Consult for HBO : 09/10/2022 Skin care regimen initiated : 09/10/2022 Notes: Electronic Signature(s) Signed: 02/07/2023 4:56:09 PM By: Blanche East RN Entered By: Blanche East on 02/07/2023 09:39:51 -------------------------------------------------------------------------------- Negative Pressure Wound Therapy Maintenance (NPWT) Details Patient Name: Date of Service: [name], [name]. 02/07/2023 8:30 A M Medical Record Number: [id number] Patient Account Number: [id number] Date of Birth/Sex: Treating RN: 11-18-1956 (67 y.o. [name] Primary Care Kaulin Chaves: Nelda Bucks Other Clinician: Referring Jamarie Mussa: Treating Linwood Gullikson/Extender: Doristine Bosworth in Treatment: 21 NPWT Maintenance Performed for: Wound #1 Left Calcaneus Additional Injuries Covered: No Performed By: Blanche East, RN Type: VAC System Coverage Size (sq cm): 32.4 Pressure Type: Constant Pressure Setting: 125 mmHG Drain Type: None Primary Contact: Non-Adherent Sponge/Dressing Type: Foam- Black Date Initiated: 12/27/2022 Dressing Removed: Yes Quantity of Sponges/Gauze Removed: 1 Canister Exudate Volume: 300 Dressing Reapplied: Yes Quantity of Sponges/Gauze Inserted: 1 Days On NPWT : 43 Post Procedure Diagnosis Same as Pre-procedure Electronic Signature(s) Signed: 02/07/2023 4:56:09 PM By: Blanche East RN Entered By: Blanche East on 02/07/2023 09:06:24 [name] ([id number])  [id number] Page 5 of 10 -------------------------------------------------------------------------------- Pain Assessment Details Patient Name: Date of Service: [name], [name]. 02/07/2023 8:30 A M Medical Record Number: [id number] Patient Account Number: [id number] Date of Birth/Sex: Treating RN: 22-Aug-1956 (67 y.o. [name] Primary Care Samariyah Cowles: Nelda Bucks Other Clinician: Referring Thara Searing: Treating Mera Gunkel/Extender: Doristine Bosworth in Treatment: 21 Active Problems Location of Pain Severity and Description of Pain Patient Has Paino No Site Locations Rate the pain. Current Pain [name]: 0 Pain Management and Medication Current Pain Management: Electronic Signature(s) Signed: 02/07/2023 4:56:09 PM By: Blanche East RN Entered By: Blanche East on 02/07/2023 08:44:16 -------------------------------------------------------------------------------- Patient/Caregiver Education Details Patient Name: Date of Service: [name] [name] 2/26/2024andnbsp8:30 Grace Record Number: [id number] Patient Account Number: [id number] Date of Birth/Gender: Treating RN: 1956-07-30 (67 y.o. [name] Primary Care Physician: Nelda Bucks Other Clinician: Referring Physician: Treating Physician/Extender: Doristine Bosworth in Treatment: 21 Education Assessment Education Provided To: Patient Education Topics Provided Wound Debridement: Methods: Explain/Verbal Responses: Reinforcements needed, State content correctly Wound/Skin Impairment: Methods: Explain/Verbal Responses: Reinforcements needed, State content correctly [employer]) ROARY, CURB [name] ([id number]) [id number] Page 6 of 10 Signed: 02/07/2023 4:56:09 PM By: Blanche East RN Entered By: Blanche East on 02/07/2023  09:40:09 -------------------------------------------------------------------------------- Wound Assessment Details Patient Name: Date of Service: [name], [name]. 02/07/2023 8:30 A M Medical Record Number: [id number] Patient Account Number: [id number] Date of Birth/Sex: Treating RN: [birth year] (67 y.o. [name] Primary Care Treylin Burtch: Nelda Bucks Other Clinician: Referring Mettie Roylance: Treating Nirvan Laban/Extender: Doristine Bosworth in Treatment: 21 Wound Status Wound Number: 1 Primary Diabetic Wound/Ulcer of the Lower Extremity Etiology: Wound  Location: Left Calcaneus Wound Open Wounding Event: Pressure Injury Status: Date Acquired: 03/09/2019 Comorbid Cataracts, Sleep Apnea, Hypertension, Type II Diabetes, Weeks Of Treatment: 21 History: Osteomyelitis, Neuropathy Clustered Wound: No Photos Wound Measurements Length: (cm) 7.2 Width: (cm) 4.5 Depth: (cm) 0.3 Area: (cm) 25.447 Volume: (cm) 7.634 % Reduction in Area: -17.8% % Reduction in Volume: 64.7% Epithelialization: Small (1-33%) Tunneling: No Undermining: No Wound Description Classification: Grade 3 Wound Margin: Thickened Exudate Amount: Large Exudate Type: Serosanguineous Exudate Color: red, brown Foul Odor After Cleansing: No Slough/Fibrino Yes Wound Bed Granulation Amount: Medium (34-66%) Exposed Structure Granulation Quality: Red, Pale Fascia Exposed: No Necrotic Amount: Medium (34-66%) Fat Layer (Subcutaneous Tissue) Exposed: Yes Necrotic Quality: Eschar, Adherent Slough Tendon Exposed: No Muscle Exposed: No Joint Exposed: No Bone Exposed: Yes Periwound Skin Texture Texture Color No Abnormalities Noted: No No Abnormalities Noted: Yes Callus: Yes Temperature / Pain Temperature: No Abnormality Moisture No Abnormalities Noted: No Dry / Scaly: No Maceration: Yes Treatment Notes Wound #1 (Calcaneus) Wound Laterality: Left [name], [name] ([id number])  [id number].pdf Page 7 of 10 Cleanser Normal Saline Discharge Instruction: Cleanse the wound with Normal Saline prior to applying a clean dressing using gauze sponges, not tissue or cotton balls. Soap and Water Discharge Instruction: May shower and wash wound with dial antibacterial soap and water prior to dressing change. Peri-Wound Care keystone Topical Primary Dressing VAC Secondary Dressing Secured With Coban Self-Adherent Wrap 4x5 (in/yd) Discharge Instruction: Secure with Coban as directed. Kerlix Roll Sterile, 4.5x3.1 (in/yd) Discharge Instruction: Secure with Kerlix as directed. Compression Wrap Compression Stockings Add-Ons Electronic Signature(s) Signed: 02/07/2023 4:56:09 PM By: Blanche East RN Entered By: Blanche East on 02/07/2023 08:56:10 -------------------------------------------------------------------------------- Wound Assessment Details Patient Name: Date of Service: [place], [name]. 02/07/2023 8:30 A M Medical Record Number: [id number] Patient Account Number: [id number] Date of Birth/Sex: Treating RN: Jul 23, 1956 (67 y.o. [name] Primary Care Jenessa Gillingham: Nelda Bucks Other Clinician: Referring Zakeria Kulzer: Treating Wally Behan/Extender: Doristine Bosworth in Treatment: 21 Wound Status Wound Number: 2 Primary Diabetic Wound/Ulcer of the Lower Extremity Etiology: Wound Location: Left, Dorsal Foot Wound Open Wounding Event: Pressure Injury Status: Date Acquired: 07/09/2022 Comorbid Cataracts, Sleep Apnea, Hypertension, Type II Diabetes, Weeks Of Treatment: 21 History: Osteomyelitis, Neuropathy Clustered Wound: No Wound Measurements Length: (cm) 2 Width: (cm) 2.5 Depth: (cm) 0.1 Area: (cm) 3.927 Volume: (cm) 0.393 % Reduction in Area: 58.3% % Reduction in Volume: 58.3% Epithelialization: Small (1-33%) Tunneling: No Undermining: No Wound Description Classification: Grade 2 Wound Margin: Flat  and Intact Exudate Amount: Medium Exudate Type: Serosanguineous Exudate Color: red, brown Foul Odor After Cleansing: No Slough/Fibrino Yes Wound Bed Granulation Amount: Medium (34-66%) Exposed Structure Granulation Quality: Red, Pink Fascia Exposed: No Necrotic Amount: Medium (34-66%) Fat Layer (Subcutaneous Tissue) Exposed: Yes Necrotic Quality: Adherent Slough Tendon Exposed: No Muscle Exposed: No Joint Exposed: No Bone Exposed: No [name], [name] ([id number]) [id number] Page 8 of 10 Periwound Skin Texture Texture Color No Abnormalities Noted: No No Abnormalities Noted: Yes Scarring: Yes Temperature / Pain Temperature: No Abnormality Moisture No Abnormalities Noted: No Dry / Scaly: No Maceration: Yes Treatment Notes Wound #2 (Foot) Wound Laterality: Dorsal, Left Cleanser Normal Saline Discharge Instruction: Cleanse the wound with Normal Saline prior to applying a clean dressing using gauze sponges, not tissue or cotton balls. Soap and Water Discharge Instruction: May shower and wash wound with dial antibacterial soap and water prior to dressing change. Peri-Wound Care keystone Topical Primary Dressing Promogran Prisma Matrix, 4.34 (sq in) (silver collagen) Discharge  Instruction: Moisten collagen with hydrogel Secondary Dressing Zetuvit Plus Silicone Border Dressing 4x4 (in/in) Discharge Instruction: Apply silicone border over primary dressing as directed. Secured With [employer] 4x5 (in/yd) Discharge Instruction: Secure with Coban as directed. Kerlix Roll Sterile, 4.5x3.1 (in/yd) Discharge Instruction: Secure with Kerlix as directed. Compression Wrap Compression Stockings Add-Ons Electronic Signature(s) Signed: 02/07/2023 4:56:09 PM By: Blanche East RN Entered By: Blanche East on 02/07/2023 08:56:35 -------------------------------------------------------------------------------- Wound Assessment Details Patient Name:  Date of Service: [name], [name]. 02/07/2023 8:30 A M Medical Record Number: [id number] Patient Account Number: [id number] Date of Birth/Sex: Treating RN: May 04, 1956 (67 y.o. [name] Primary Care Fionn Stracke: Nelda Bucks Other Clinician: Referring Alonda Weaber: Treating Sanjuan Sawa/Extender: Doristine Bosworth in Treatment: 21 Wound Status Wound Number: 3 Primary Cyst Etiology: Wound Location: Right Shoulder Wound Healed - Epithelialized Wounding Event: Gradually Appeared Status: Date Acquired: 12/14/2022 Comorbid Cataracts, Sleep Apnea, Hypertension, Type II Diabetes, Weeks Of Treatment: 7 History: Osteomyelitis, Neuropathy Clustered Wound: No Photos ZADEN, NASCA ([id number]) [id number] Page 9 of 10 Wound Measurements Length: (cm) Width: (cm) Depth: (cm) Area: (cm) Volume: (cm) 0 % Reduction in Area: 93.7% 0 % Reduction in Volume: 96% 0 Epithelialization: Small (1-33%) 0 Tunneling: No 0 Undermining: No Wound Description Classification: Full Thickness Without Exposed Support Structures Wound Margin: Distinct, outline attached Exudate Amount: Medium Exudate Type: Serosanguineous Exudate Color: red, brown Foul Odor After Cleansing: No Slough/Fibrino No Wound Bed Granulation Amount: Large (67-100%) Exposed Structure Necrotic Amount: Small (1-33%) Fascia Exposed: No Necrotic Quality: Eschar Fat Layer (Subcutaneous Tissue) Exposed: Yes Tendon Exposed: No Muscle Exposed: No Joint Exposed: No Bone Exposed: No Periwound Skin Texture Texture Color No Abnormalities Noted: No No Abnormalities Noted: Yes Rash: Yes Temperature / Pain Temperature: No Abnormality Moisture No Abnormalities Noted: No Dry / Scaly: Yes Maceration: No Treatment Notes Wound #3 (Shoulder) Wound Laterality: Right Cleanser Peri-Wound Care Topical Primary Dressing Secondary Dressing Secured With Compression Wrap Compression  Stockings Add-Ons Electronic Signature(s) Signed: 02/07/2023 4:56:09 PM By: Blanche East RN Entered By: Blanche East on 02/07/2023 09:04:10 Vitals Details -------------------------------------------------------------------------------- [name] ([id number]) [id number] Page 10 of 10 Patient Name: Date of Service: [name], [name]. 02/07/2023 8:30 A M Medical Record Number: [id number] Patient Account Number: [id number] Date of Birth/Sex: Treating RN: 1956-05-18 (67 y.o. [name] Primary Care Loralyn Rachel: Nelda Bucks Other Clinician: Referring Shyvonne Chastang: Treating Aaryan Essman/Extender: Doristine Bosworth in Treatment: 21 Vital Signs Time Taken: 08:43 Temperature (F): 97.6 Height (in): 74 Pulse (bpm): 96 Weight (lbs): 245 Respiratory Rate (breaths/min): 18 Body Mass Index (BMI): 31.5 Blood Pressure (mmHg): 146/80 Capillary Blood Glucose (mg/dl): 179 Reference Range: 80 - 120 mg / dl Electronic Signature(s) Signed: 02/07/2023 4:56:09 PM By: Blanche East RN Entered By: Blanche East on 02/07/2023 08:44:10

## 2023-02-08 NOTE — Progress Notes (Signed)
ISAIS, SADLOWSKI R ([id number]) [id number] Page 1 of 12 Visit Report for 02/07/2023 Chief Complaint Document Details Patient Name: Date of Service: [name] [name] 02/07/2023 8:30 A M Medical Record Number: [id number] Patient Account Number: [id number] Date of Birth/Sex: Treating RN: 1956/05/28 (67 y.o. M) Primary Care Provider: Nelda Bucks Other Clinician: Referring Provider: Treating Provider/Extender: Doristine Bosworth in Treatment: 21 Information Obtained from: Patient Chief Complaint Patients presents for treatment of an open diabetic ulcer and evaluation for hyperbaric oxygen therapy Electronic Signature(s) Signed: 02/07/2023 9:28:07 AM By: Fredirick Maudlin MD FACS Entered By: Fredirick Maudlin on 02/07/2023 09:28:07 -------------------------------------------------------------------------------- Debridement Details Patient Name: Date of Service: [name], [name] 02/07/2023 8:30 A M Medical Record Number: [id number] Patient Account Number: [id number] Date of Birth/Sex: Treating RN: Aug 16, 1956 (67 y.o. [name] Primary Care Provider: Nelda Bucks Other Clinician: Referring Provider: Treating Provider/Extender: Doristine Bosworth in Treatment: 21 Debridement Performed for Assessment: Wound #1 Left Calcaneus Performed By: Physician Fredirick Maudlin, MD Debridement Type: Debridement Severity of Tissue Pre Debridement: Fat layer exposed Level of Consciousness (Pre-procedure): Awake and Alert Pre-procedure Verification/Time Out Yes - 09:03 Taken: Start Time: 09:04 T Area Debrided (L x W): otal 7.2 (cm) x 4.5 (cm) = 32.4 (cm) Tissue and other material debrided: Viable, Non-Viable, Callus, Slough, Subcutaneous, Skin: Epidermis, Slough Level: Skin/Subcutaneous Tissue Debridement Description: Excisional Instrument: Curette Bleeding: Minimum Hemostasis Achieved: Pressure Procedural Pain:  0 Post Procedural Pain: 0 Response to Treatment: Procedure was tolerated well Level of Consciousness (Post- Awake and Alert procedure): Post Debridement Measurements of Total Wound Length: (cm) 7.2 Width: (cm) 4.5 Depth: (cm) 0.1 Volume: (cm) 2.545 Character of Wound/Ulcer Post Debridement: Requires Further Debridement Severity of Tissue Post Debridement: Fat layer exposed Post Procedure Diagnosis Same as Pre-procedure Notes Scribed for Dr. Celine Ahr by Blanche East, RN Electronic Signature(s) Signed: 02/07/2023 12:01:26 PM By: Fredirick Maudlin MD FACS Signed: 02/07/2023 4:56:09 PM By: Blanche East RN [name], [place] ([id number]) [id number] Page 2 of 12 Entered By: Blanche East on 02/07/2023 09:08:21 -------------------------------------------------------------------------------- HPI Details Patient Name: Date of Service: [name], [name] [name] 02/07/2023 8:30 A M Medical Record Number: [id number] Patient Account Number: [id number] Date of Birth/Sex: Treating RN: 27-Dec-1955 (67 y.o. M) Primary Care Provider: Nelda Bucks Other Clinician: Referring Provider: Treating Provider/Extender: Doristine Bosworth in Treatment: 21 History of Present Illness HPI Description: ADMISSION 09/10/2022 This is a 67 year old poorly controlled type II diabetic (last hemoglobin A1c 10.8%) who has had an ulcer on his heel for over 3 years. He has been seen in multiple wound care centers, including [facility] and [facility]. He reports that at least 3 doctors have recommended that he undergo below-knee amputation. He most recently met with Dr. Catalina Gravel, a vascular surgeon affiliated with [facility]. 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 Unfortunately, the patient has some extenuating social circumstances including the fact that he cares for his wife who has stage IV  colon cancer and still works, driving vehicles for [employer]. 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 He was recently hospitalized at [facility] for sepsis and currently has a PICC line through which he receives IV antibiotics. He reports having had another MRI during that hospital stay along with a chest x-ray and EKG. He apparently contacted one of the hyperbaric therapy techs here and asked a number of questions about hyperbaric oxygen  treatments. He subsequently self-referred to our center to undergo further evaluation and management. I mention to him that [facility] actually has hyperbaric chambers, but he states that he lives in [place] and this would be more convenient for him given the intensive nature of the therapy and time requirement. ABI in clinic today was 0.94. The patient actually has 2 wounds. There is a wound on the dorsum of his left foot with heavy black eschar and slough present. After debridement, this was demonstrated to involve the muscle and the extensor tendons are exposed. On his heel, there is essentially a "shark bite" type wound, with much of the heel fat pad absent. The muscle layer is exposed. There is blue-green staining around the perimeter of the wound, but no significant odor. He says he has been applying collagen to the wound on his heel and Silvadene and Betadine to the wound on his dorsal foot. 09/20/2022: The heel wound is quite macerated with wet periwound callus. There is slough accumulation on the surface. The dorsal foot wound looks better this week. There is still exposed tendon, but it is fairly clean with just a little biofilm buildup. We are still working on gathering the required documentation to submit for pretreatment review for hyperbaric oxygen therapy. 09/29/2022: The dorsal foot wound continues to improve. I do not see any exposed tendon at this point. There is just some slough accumulation on the  wound surface. He continues to have very wet macerated periwound callus on his heel. There is slough on the surface, but it is loose and thin. There is an area of undermining at the 11 o'clock position, but the overlying skin and subcutaneous tissue is healthy and viable. He has been approved for hyperbaric oxygen therapy and will start treatment tomorrow. 10/06/2022: Continued contraction and improvement of the dorsal foot wound. There is just a little bit of slough accumulation on the wound surface. The periwound callus continues to accumulate on the heel wound and it is quite macerated. It is also persistently found with blue-green staining present. He did initiate his hyperbaric oxygen therapy, but has had significant difficulty with decompression. He will be going to ENT to have PE tubes placed. 10/18/2022: His hyperbaric oxygen therapy is on hold while his otological issues are being addressed. He came in again today with the periwound skin on both the dorsal aspect of his foot and his calcaneus completely macerated. The blue-green discoloration, however, has abated and the undermining on the calcaneus has improved. The dorsal foot wound has also contracted somewhat. 10/26/2022: The dorsal foot wound continues to contract and fill with good granulation tissue. The heel, once again, has a rim of macerated callus, but the undermining and tunneling continues to contract. He has completed his oral antibiotics and has been using the [facility] topical compounded antibiotic for his dressing changes at home. He is scheduled to see ENT this afternoon. 11/08/2022: The dorsal foot wound is flush with the surrounding skin and has a good granulation tissue surface. There is some slough accumulation. As usual, the heel has a rim of macerated callus but the dimensions are smaller and the tunneling and undermining have contracted further. He has resumed his hyperbaric oxygen therapy. 12/12; patient seen for  wound evaluation. He is tolerating HBO although we could not dive him yesterday because of relative hypoglycemia and the fact he had given himself NovoLog insulin before he came to clinic. Today his blood sugar is in the 180 range she should be fine. He  has a large wound on the plantar calcaneus on the right and a more superficial area on the dorsal foot. He is using a scooter for offloading 11/30/2022: The dorsal foot wound continues to contract. There is some slough on the wound surface. The large calcaneus wound has heaped up wet callus around the margins. Continued undermining. 12/08/2022: The dorsal foot wound is flat and flush with the surrounding skin surface. There is some slough present. Once again, the calcaneal wound has thick, absolutely macerated callus hanging off in tatters around the edges. The patient cannot explain to me why this part of his wound gets so wet. There is slough on the surface. The undermined portion of the wound has filled in. 12/17/2022: Earlier this week, he presented for his hyperbaric oxygen therapy and was hypotensive and ill-appearing. He also had what appeared to be an infected sebaceous cyst on his right upper arm. He was sent to the emergency department. He was given a fluid bolus and the ED provider lanced the cyst. He was prescribed doxycycline. He seems to be feeling better today. He notes that he has had copious drainage from his foot. On further questioning, he states that he has not been taking his oral diuretic. The dorsal foot wound has expanded somewhat, but is actually more superficial. The plantar heel wound is about the same. 12/27/2022: The dorsal foot wound has epithelialized further. The plantar foot wound is about the same size but has heaped up macerated callus around the perimeter. The intake nurse noted a tiny fragment of bone on his dressings and there is now an area at about the 5 o'clock position where 1 can palpate bone through a small slit  in the soft tissues. The wound on his arm is contracting but he still likely has cyst wall present due to the manner in which the infected sebaceous cyst was addressed in the ER. 01/01/2023: The wound on his shoulder has contracted considerably. The dorsal foot wound has some slough on the surface but also looks to be improving. The plantar foot wound is basically unchanged. 01/10/2023: The wound on his shoulder continues to diminish in size and depth. The dorsal foot wound is improving with just a light layer of slough on the surface. The plantar foot wound appears to be contracting but still has thick wet callus around the perimeter. We did in chamber T cPO2 monitoring during his SAMRATH, PELT ([id number]) [id number] Page 3 of 12 last hyperbaric oxygen therapy. The results show that he has extremely poor tissue perfusion at room air and 1 atm of pressure. He does respond extremely well to hyperbaric oxygen therapy, however. 01/17/2023: The wound on his shoulder is down to 0.6 cm in depth, down from 1 cm last week. The dorsal foot wound is quite a bit smaller this week with just some light slough on the surface. The plantar foot wound is also smaller and the area of bone that was exposed at the posterior heel is now covered. There is still substantial wet callus around the perimeter. He is scheduled to undergo formal vascular studies sometime next week. 01/24/2023: The wound on his shoulder has almost completely filled in, with just a little bit of depth remaining. The dorsal foot wound got macerated; it appears that his home health nurses are applying the drape for his heel wound over the top of this, meaning that the dorsal foot wound is not getting changed as regularly as it is supposed to. There is some slough on  the surface. The plantar foot wound has thick wet callus around the perimeter. The dimensions measure a little bit smaller. He is having his vascular studies  done this afternoon. 02/07/2023: The wound on his shoulder is healed. The dorsal foot wound continues to be subjected to excessive moisture due to the home health nurses applying the drape from his wound VAC over the top of it. It does measure smaller, however. There is more bone exposed on his calcaneus, unfortunately. He has heaped up wet callus around the edges of the heel wound again. He is going to undergo angiography with Dr. Stanford Breed on Friday. Hopefully there will be intervention available to him that may aid in his wound healing. Electronic Signature(s) Signed: 02/07/2023 9:29:22 AM By: Fredirick Maudlin MD FACS Entered By: Fredirick Maudlin on 02/07/2023 09:29:22 -------------------------------------------------------------------------------- Physical Exam Details Patient Name: Date of Service: [name], [name] 02/07/2023 8:30 A M Medical Record Number: [id number] Patient Account Number: [id number] Date of Birth/Sex: Treating RN: Nov 02, 1956 (67 y.o. M) Primary Care Provider: Nelda Bucks Other Clinician: Referring Provider: Treating Provider/Extender: Doristine Bosworth in Treatment: 21 Constitutional Slightly hypertensive. . . . no acute distress. Respiratory Normal work of breathing on room air. Notes 02/07/2023: The wound on his shoulder is healed. The dorsal foot wound continues to be subjected to excessive moisture due to the home health nurses applying the drape from his wound VAC over the top of it. It does measure smaller, however. There is more bone exposed on his calcaneus, unfortunately. He has heaped up wet callus around the edges of the heel wound again. Electronic Signature(s) Signed: 02/07/2023 9:29:57 AM By: Fredirick Maudlin MD FACS Entered By: Fredirick Maudlin on 02/07/2023 09:29:56 -------------------------------------------------------------------------------- Physician Orders Details Patient Name: Date of Service: [name], [name]  02/07/2023 8:30 A M Medical Record Number: [id number] Patient Account Number: [id number] Date of Birth/Sex: Treating RN: October 29, 1956 (67 y.o. 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 - Dr. Celine Ahr rm 4 Return [place] ([id number]) [id number] Page 4 of 12 Cellular or Tissue Based Products Other Cellular or Tissue Based Products Orders/Instructions: - run insurance for [employer] May shower with protection but do not get wound dressing(s) wet. Protect dressing(s) with water repellant cover (for example, large plastic bag) or a cast cover and may then take shower. Negative Presssure Wound Therapy Wound Vac to wound continuously at 114m/hg pressure - to left calcaneus Black Foam Edema Control - Lymphedema / SCD / Other Left Lower Extremity Avoid standing for long periods of time. Off-Loading Wound #1 Left Calcaneus Other: - use knee scooter to ambulate, minimal weight bearing right foot Home [place] / Friday [facility] Please do not cover Dorsal wound on Lt ft with wound vac drape. This dressing needs to be changed daily due to the increased drainage. The peri-wound continues to be macerated. Other Home Health Orders/Instructions: -  [facility] Hyperbaric Oxygen Therapy Wound #1 Left Calcaneus Evaluate for HBO Therapy - Continue HBOT for additional 20 treatments for total of 60 treatments Indication: - chronic refractory osteomyelitis If appropriate for treatment, begin HBOT per protocol: 2.5 ATA for 90 Minutes with 2 Five (5) Minute A Breaks ir Total Number of Treatments: - 60 One treatments per day (delivered Monday through Friday unless otherwise specified in Special Instructions below): Finger stick Blood Glucose Pre- and Post- HBOT Treatment. Follow Hyperbaric Oxygen Glycemia Protocol A frin (Oxymetazoline HCL) 0.05% nasal spray - 1 spray in both nostrils daily as needed prior to HBO treatment for difficulty clearing ears Wound Treatment Wound #1 - Calcaneus Wound Laterality: Left Cleanser: Normal Saline 3 x Per Week/30 Days Discharge Instructions: Cleanse the wound with Normal Saline prior to applying a clean dressing using gauze sponges, not tissue or cotton balls. Cleanser: Soap and Water 3 x Per Week/30 Days Discharge Instructions: May shower and wash wound with dial antibacterial soap and water prior to dressing change. Peri-Wound Care: keystone 3 x Per Week/30 Days Prim Dressing: VAC ary 3 x Per Week/30 Days Secured With: Coban Self-Adherent Wrap 4x5 (in/yd) 3 x Per Week/30 Days Discharge Instructions: Secure with Coban as directed. Secured With: [employer], 4.5x3.1 (in/yd) 3 x Per Week/30 Days Discharge Instructions: Secure with Kerlix as directed. Wound #2 - Foot Wound Laterality: Dorsal, Left Cleanser: Normal Saline Discharge Instructions: Cleanse the wound with Normal Saline prior to applying a clean dressing using gauze sponges, not tissue or cotton balls. Cleanser: Soap and Water Discharge Instructions: May shower and wash wound with dial antibacterial soap and water prior to dressing change. Peri-Wound Care: keystone Prim Dressing: Promogran Prisma Matrix, 4.34 (sq in) (silver  collagen) ary Discharge Instructions: Moisten collagen with hydrogel Secondary Dressing: Zetuvit Plus Silicone Border Dressing 4x4 (in/in) Discharge Instructions: Apply silicone border over primary dressing as directed. Secured With: Coban Self-Adherent Wrap 4x5 (in/yd) Discharge Instructions: Secure with Coban as directed. Secured With: [employer], 4.5x3.1 (in/yd) Discharge Instructions: Secure with Kerlix as directed. JASAI, SANDELIN R ([id number]) [id number] Page 5 of 12 GLYCEMIA INTERVENTIONS PROTOCOL PRE-HBO GLYCEMIA INTERVENTIONS ACTION INTERVENTION Obtain pre-HBO capillary blood glucose (ensure 1 physician order is in chart). A. Notify HBO physician and await physician orders. 2 If result is 70 mg/dl or below: B. If the result meets the hospital definition of a critical result, follow hospital policy. A. Give patient an 8 ounce Glucerna Shake, an 8 ounce Ensure, or 8 ounces of a Glucerna/Ensure equivalent dietary supplement*. B. Wait 30 minutes. If result is 71 mg/dl to 130 mg/dl: C. Retest patients capillary blood glucose (CBG). D. If result greater than or equal to 110 mg/dl, proceed with HBO. If result less than 110 mg/dl, notify HBO physician and consider holding HBO. If result is 131 mg/dl to 249 mg/dl: A. Proceed with HBO. A. Notify HBO physician and await physician orders. B. It is recommended to hold HBO and do If result is 250 mg/dl or greater: blood/urine ketone testing. C. If the result meets the hospital definition of a critical result, follow hospital policy. POST-HBO GLYCEMIA INTERVENTIONS ACTION INTERVENTION Obtain post HBO capillary blood glucose (ensure 1 physician order is in chart). A. Notify HBO physician and await physician orders. 2 If result is 70 mg/dl or below: B. If the result meets the hospital definition of a critical result, follow hospital policy. A. Give patient an 8 ounce Glucerna Shake,  an 8 ounce Ensure, or 8 ounces of a Glucerna/Ensure equivalent  dietary supplement*. B. Wait 15 minutes for symptoms of If result is 71 mg/dl to 100 mg/dl: hypoglycemia (i.e. nervousness, anxiety, sweating, chills, clamminess, irritability, confusion, tachycardia or dizziness). C. If patient asymptomatic, discharge patient. If patient symptomatic, repeat capillary blood glucose (CBG) and notify HBO physician. If result is 101 mg/dl to 249 mg/dl: A. Discharge patient. A. Notify HBO physician and await physician orders. B. It is recommended to do blood/urine ketone If result is 250 mg/dl or greater: testing. C. If the result meets the hospital definition of a critical result, follow hospital policy. *Juice or candies are NOT equivalent products. If patient refuses the Glucerna or Ensure, please consult the hospital dietitian for an appropriate substitute. Electronic Signature(s) Signed: 02/07/2023 12:01:26 PM By: Fredirick Maudlin MD FACS Entered By: Fredirick Maudlin on 02/07/2023 09:30:22 -------------------------------------------------------------------------------- Problem List Details Patient Name: Date of Service: [name], [name] 02/07/2023 8:30 A M Medical Record Number: [id number] Patient Account Number: [id number] Date of Birth/Sex: Treating RN: 16-Aug-1956 (67 y.o. 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, left ankle and foot 09/10/2022 No Yes E11.65 Type 2  diabetes mellitus with hyperglycemia 09/10/2022 No Yes E11.621 Type 2 diabetes mellitus with foot ulcer 09/10/2022 No Yes Inactive Problems ICD-10 Code Description Active Date Inactive Date L72.3 Sebaceous cyst 12/17/2022 12/17/2022 Resolved Problems ICD-10 Code Description Active Date Resolved Date L98.492 Non-pressure chronic ulcer of skin of other sites with fat layer exposed 12/17/2022 01/03/2023 Electronic Signature(s) Signed: 02/07/2023 9:27:51 AM By: Fredirick Maudlin MD FACS Entered By: Fredirick Maudlin on 02/07/2023 09:27:51 -------------------------------------------------------------------------------- Progress Note Details Patient Name: Date of Service: [name], [name] 02/07/2023 8:30 A M Medical Record Number: [id number] Patient Account Number: [id number] Date of Birth/Sex: Treating RN: [birth year] (67 y.o. M) Primary Care Provider: Nelda Bucks Other Clinician: Referring Provider: Treating Provider/Extender: Doristine Bosworth in Treatment: 21 Subjective Chief Complaint Information obtained from Patient Patients presents for treatment of an open diabetic ulcer and evaluation for hyperbaric oxygen therapy History of Present Illness (HPI) ADMISSION 09/10/2022 This is a 67 year old poorly controlled type II diabetic (last hemoglobin A1c 10.8%) who has had an ulcer on his heel for over 3 years. He has been seen in multiple wound care centers, including [facility] and [facility]. He reports that at least 3 doctors have recommended that he undergo below-knee amputation. He most recently met with Dr. Catalina Gravel, a vascular surgeon affiliated with [facility]. 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 Unfortunately, the patient has some extenuating social circumstances including the fact that he cares for his wife who has stage IV colon cancer and still works, driving vehicles  for [employer]. 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 He was recently hospitalized at [facility] for sepsis and currently has a PICC line through which he receives IV antibiotics. He reports having had another MRI during that hospital stay along with a chest x-ray and EKG. He apparently contacted one of the hyperbaric therapy techs here and asked a number of  questions about hyperbaric oxygen treatments. He subsequently self-referred to our center to undergo further evaluation and management. I mention to him that [facility] actually has hyperbaric chambers, but he states that he lives in [place] and this would be more convenient for him given the intensive nature of the therapy and time requirement. ABI in clinic today was 0.94. The patient actually has 2 wounds. There is a wound on the dorsum of his left foot with heavy black eschar and slough present. After debridement, this was demonstrated to involve the muscle and the extensor tendons are exposed. On his heel, there is essentially a "shark bite" type wound, with much of the heel fat pad absent. The muscle layer is exposed. There is blue-green staining around the perimeter of the wound, but no significant odor. He says he has been applying collagen to the wound on his heel and Silvadene and Betadine to the wound on his dorsal foot. 09/20/2022: The heel wound is quite macerated with wet periwound callus. There is slough accumulation on the surface. The dorsal foot wound looks better this week. There is still exposed tendon, but it is fairly clean with just a little biofilm buildup. We are still working on gathering the required documentation to submit for pretreatment review for hyperbaric oxygen therapy. RENDON, ATONDO R ([id number]) [id number] Page 7 of 12 09/29/2022: The dorsal foot wound continues to improve. I do not see any exposed tendon at this point. There is just some  slough accumulation on the wound surface. He continues to have very wet macerated periwound callus on his heel. There is slough on the surface, but it is loose and thin. There is an area of undermining at the 11 o'clock position, but the overlying skin and subcutaneous tissue is healthy and viable. He has been approved for hyperbaric oxygen therapy and will start treatment tomorrow. 10/06/2022: Continued contraction and improvement of the dorsal foot wound. There is just a little bit of slough accumulation on the wound surface. The periwound callus continues to accumulate on the heel wound and it is quite macerated. It is also persistently found with blue-green staining present. He did initiate his hyperbaric oxygen therapy, but has had significant difficulty with decompression. He will be going to ENT to have PE tubes placed. 10/18/2022: His hyperbaric oxygen therapy is on hold while his otological issues are being addressed. He came in again today with the periwound skin on both the dorsal aspect of his foot and his calcaneus completely macerated. The blue-green discoloration, however, has abated and the undermining on the calcaneus has improved. The dorsal foot wound has also contracted somewhat. 10/26/2022: The dorsal foot wound continues to contract and fill with good granulation tissue. The heel, once again, has a rim of macerated callus, but the undermining and tunneling continues to contract. He has completed his oral antibiotics and has been using the [facility] topical compounded antibiotic for his dressing changes at home. He is scheduled to see ENT this afternoon. 11/08/2022: The dorsal foot wound is flush with the surrounding skin and has a good granulation tissue surface. There is some slough accumulation. As usual, the heel has a rim of macerated callus but the dimensions are smaller and the tunneling and undermining have contracted further. He has resumed his hyperbaric oxygen  therapy. 12/12; patient seen for wound evaluation. He is tolerating HBO although we could not dive him yesterday because of relative hypoglycemia and the fact he had given himself NovoLog insulin before he came to clinic. Today  his blood sugar is in the 180 range she should be fine. He has a large wound on the plantar calcaneus on the right and a more superficial area on the dorsal foot. He is using a scooter for offloading 11/30/2022: The dorsal foot wound continues to contract. There is some slough on the wound surface. The large calcaneus wound has heaped up wet callus around the margins. Continued undermining. 12/08/2022: The dorsal foot wound is flat and flush with the surrounding skin surface. There is some slough present. Once again, the calcaneal wound has thick, absolutely macerated callus hanging off in tatters around the edges. The patient cannot explain to me why this part of his wound gets so wet. There is slough on the surface. The undermined portion of the wound has filled in. 12/17/2022: Earlier this week, he presented for his hyperbaric oxygen therapy and was hypotensive and ill-appearing. He also had what appeared to be an infected sebaceous cyst on his right upper arm. He was sent to the emergency department. He was given a fluid bolus and the ED provider lanced the cyst. He was prescribed doxycycline. He seems to be feeling better today. He notes that he has had copious drainage from his foot. On further questioning, he states that he has not been taking his oral diuretic. The dorsal foot wound has expanded somewhat, but is actually more superficial. The plantar heel wound is about the same. 12/27/2022: The dorsal foot wound has epithelialized further. The plantar foot wound is about the same size but has heaped up macerated callus around the perimeter. The intake nurse noted a tiny fragment of bone on his dressings and there is now an area at about the 5 o'clock position where 1 can  palpate bone through a small slit in the soft tissues. The wound on his arm is contracting but he still likely has cyst wall present due to the manner in which the infected sebaceous cyst was addressed in the ER. 01/01/2023: The wound on his shoulder has contracted considerably. The dorsal foot wound has some slough on the surface but also looks to be improving. The plantar foot wound is basically unchanged. 01/10/2023: The wound on his shoulder continues to diminish in size and depth. The dorsal foot wound is improving with just a light layer of slough on the surface. The plantar foot wound appears to be contracting but still has thick wet callus around the perimeter. We did in chamber T cPO2 monitoring during his last hyperbaric oxygen therapy. The results show that he has extremely poor tissue perfusion at room air and 1 atm of pressure. He does respond extremely well to hyperbaric oxygen therapy, however. 01/17/2023: The wound on his shoulder is down to 0.6 cm in depth, down from 1 cm last week. The dorsal foot wound is quite a bit smaller this week with just some light slough on the surface. The plantar foot wound is also smaller and the area of bone that was exposed at the posterior heel is now covered. There is still substantial wet callus around the perimeter. He is scheduled to undergo formal vascular studies sometime next week. 01/24/2023: The wound on his shoulder has almost completely filled in, with just a little bit of depth remaining. The dorsal foot wound got macerated; it appears that his home health nurses are applying the drape for his heel wound over the top of this, meaning that the dorsal foot wound is not getting changed as regularly as it is supposed to. There  is some slough on the surface. The plantar foot wound has thick wet callus around the perimeter. The dimensions measure a little bit smaller. He is having his vascular studies done this afternoon. 02/07/2023: The wound on his  shoulder is healed. The dorsal foot wound continues to be subjected to excessive moisture due to the home health nurses applying the drape from his wound VAC over the top of it. It does measure smaller, however. There is more bone exposed on his calcaneus, unfortunately. He has heaped up wet callus around the edges of the heel wound again. He is going to undergo angiography with Dr. Stanford Breed on Friday. Hopefully there will be intervention available to him that may aid in his wound healing. Patient History Family History Cancer - Father,Mother, Diabetes - Mother,Father, Lung Disease - Father, Thyroid Problems - Mother, No family history of Heart Disease, Hereditary Spherocytosis, Hypertension, Kidney Disease, Seizures, Stroke, Tuberculosis. Social History Never smoker, Marital Status - Married, Alcohol Use - Never, Drug Use - No History, Caffeine Use - Daily. Medical History Eyes Patient has history of Cataracts Denies history of Glaucoma, Optic Neuritis Ear/Nose/Mouth/Throat Denies history of Chronic sinus problems/congestion, Middle ear problems Respiratory Patient has history of Sleep Apnea Cardiovascular Patient has history of Hypertension Gastrointestinal Denies history of Cirrhosis , Colitis, Crohnoos, Hepatitis A, Hepatitis B, Hepatitis C Endocrine Patient has history of Type II Diabetes Immunological Denies history of Lupus Erythematosus, Raynaudoos, Scleroderma MACKSON, SENA R ([id number]) [id number] Page 8 of 12 Musculoskeletal Patient has history of Osteomyelitis - 2023 Neurologic Patient has history of Neuropathy - Bila lower extremities Oncologic Denies history of Received Chemotherapy, Received Radiation Psychiatric Denies history of Anorexia/bulimia, Confinement Anxiety Hospitalization/Surgery History - I and D Left calcaneus. - back surgery- laminectomy. - eye surgery- Bila cataracts. - shoulder arthroscopy. Medical A Surgical History  Notes nd Cardiovascular hyperlipidemia Genitourinary AKI Objective Constitutional Slightly hypertensive. no acute distress. Vitals Time Taken: 8:43 AM, Height: 74 in, Weight: 245 lbs, BMI: 31.5, Temperature: 97.6 F, Pulse: 96 bpm, Respiratory Rate: 18 breaths/min, Blood Pressure: 146/80 mmHg, Capillary Blood Glucose: 179 mg/dl. Respiratory Normal work of breathing on room air. General Notes: 02/07/2023: The wound on his shoulder is healed. The dorsal foot wound continues to be subjected to excessive moisture due to the home health nurses applying the drape from his wound VAC over the top of it. It does measure smaller, however. There is more bone exposed on his calcaneus, unfortunately. He has heaped up wet callus around the edges of the heel wound again. Integumentary (Hair, Skin) Wound #1 status is Open. Original cause of wound was Pressure Injury. The date acquired was: 03/09/2019. The wound has been in treatment 21 weeks. The wound is located on the Left Calcaneus. The wound measures 7.2cm length x 4.5cm width x 0.3cm depth; 25.447cm^2 area and 7.634cm^3 volume. There is bone and Fat Layer (Subcutaneous Tissue) exposed. There is no tunneling or undermining noted. There is a large amount of serosanguineous drainage noted. The wound margin is thickened. There is medium (34-66%) red, pale granulation within the wound bed. There is a medium (34-66%) amount of necrotic tissue within the wound bed including Eschar and Adherent Slough. The periwound skin appearance had no abnormalities noted for color. The periwound skin appearance exhibited: Callus, Maceration. The periwound skin appearance did not exhibit: Dry/Scaly. Periwound temperature was noted as No Abnormality. Wound #2 status is Open. Original cause of wound was Pressure Injury. The date acquired was: 07/09/2022. The wound has been in treatment 21 weeks.  The wound is located on the Left,Dorsal Foot. The wound measures 2cm length x 2.5cm  width x 0.1cm depth; 3.927cm^2 area and 0.393cm^3 volume. There is Fat Layer (Subcutaneous Tissue) exposed. There is no tunneling or undermining noted. There is a medium amount of serosanguineous drainage noted. The wound margin is flat and intact. There is medium (34-66%) red, pink granulation within the wound bed. There is a medium (34-66%) amount of necrotic tissue within the wound bed including Adherent Slough. The periwound skin appearance had no abnormalities noted for color. The periwound skin appearance exhibited: Scarring, Maceration. The periwound skin appearance did not exhibit: Dry/Scaly. Periwound temperature was noted as No Abnormality. Wound #3 status is Healed - Epithelialized. Original cause of wound was Gradually Appeared. The date acquired was: 12/14/2022. The wound has been in treatment 7 weeks. The wound is located on the Right Shoulder. The wound measures 0cm length x 0cm width x 0cm depth; 0cm^2 area and 0cm^3 volume. There is Fat Layer (Subcutaneous Tissue) exposed. There is no tunneling or undermining noted. There is a medium amount of serosanguineous drainage noted. The wound margin is distinct with the outline attached to the wound base. There is large (67-100%) granulation within the wound bed. There is a small (1-33%) amount of necrotic tissue within the wound bed including Eschar. The periwound skin appearance had no abnormalities noted for color. The periwound skin appearance exhibited: Rash, Dry/Scaly. The periwound skin appearance did not exhibit: Maceration. Periwound temperature was noted as No Abnormality. 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 There was a Excisional Skin/Subcutaneous Tissue Debridement with a total area of 32.4 sq cm performed by Fredirick Maudlin, MD. With the following instrument(s): Curette to remove Viable and Non-Viable tissue/material. Material removed includes Callus, Subcutaneous Tissue, Slough, and Skin: Epidermis. No specimens were taken. A time out was conducted at 09:03, prior to the start of the procedure. A Minimum amount of bleeding was controlled with Pressure. The procedure was tolerated well with a pain level of 0 throughout and a pain level of 0 following the procedure. Post Debridement Measurements: 7.2cm length x 4.5cm width x 0.1cm depth; 2.545cm^3 volume. Character of Wound/Ulcer Post Debridement requires further debridement. Severity of Tissue Post Debridement is: Fat layer exposed. Post procedure Diagnosis Wound #1: Same as Pre-Procedure General Notes: Scribed for Dr. Celine Ahr by Blanche East, RN. Plan Follow-up Appointments: Return Appointment in 1 week. - Dr. Celine Ahr rm 4 Cellular or Tissue Based Products: Other Cellular or Tissue Based Products Orders/Instructions: - run insurance for Apligraf Bathing/ Shower/ Hygiene: May shower with protection but do not get wound dressing(s) wet. Protect dressing(s) with water repellant cover (for example, large plastic bag) or a cast cover and may then take shower. Negative Presssure Wound Therapy: Wound Vac to wound continuously at 115m/hg pressure - to left calcaneus Black Foam Edema Control - Lymphedema / SCD / Other: Avoid standing for long periods of time. Off-Loading: Wound #1 Left Calcaneus: Other: - use knee scooter to ambulate, minimal weight bearing right foot Home Health: Dressing  changes to be completed  by Home Health on Monday / Wednesday / Friday except when patient has scheduled visit at [facility]. - Please do not cover Dorsal wound on Lt ft with wound vac drape. This dressing needs to be changed daily due to the increased drainage. The peri-wound continues to be macerated. Other Home Health Orders/Instructions: - [facility] Hyperbaric Oxygen Therapy: Wound #1 Left Calcaneus: Evaluate for HBO Therapy - Continue HBOT for additional 20 treatments for total of 60 treatments Indication: - chronic refractory osteomyelitis If appropriate for treatment, begin HBOT per protocol: 2.5 ATA for 90 Minutes with 2 Five (5) Minute Air Breaks T Number of Treatments: - 60 otal One treatments per day (delivered Monday through Friday unless otherwise specified in Special Instructions below): Finger stick Blood Glucose Pre- and Post- HBOT Treatment. Follow Hyperbaric Oxygen Glycemia Protocol Afrin (Oxymetazoline HCL) 0.05% nasal spray - 1 spray in both nostrils daily as needed prior to HBO treatment for difficulty clearing ears WOUND #1: - Calcaneus Wound Laterality: Left Cleanser: Normal Saline 3 x Per Week/30 Days Discharge Instructions: Cleanse the wound with Normal Saline prior to applying a clean dressing using gauze sponges, not tissue or cotton balls. Cleanser: Soap and Water 3 x Per Week/30 Days Discharge Instructions: May shower and wash wound with dial antibacterial soap and water prior to dressing change. Peri-Wound Care: keystone 3 x Per Week/30 Days Prim Dressing: VAC 3 x Per Week/30 Days ary Secured With: Coban Self-Adherent Wrap 4x5 (in/yd) 3 x Per Week/30 Days Discharge Instructions: Secure with Coban as directed. Secured With: [employer], 4.5x3.1 (in/yd) 3 x Per Week/30 Days Discharge Instructions: Secure with Kerlix as directed. WOUND #2: - Foot Wound Laterality: Dorsal, Left Cleanser: Normal Saline Discharge Instructions: Cleanse the wound with Normal Saline prior to  applying a clean dressing using gauze sponges, not tissue or cotton balls. Cleanser: Soap and Water Discharge Instructions: May shower and wash wound with dial antibacterial soap and water prior to dressing change. Peri-Wound Care: keystone Prim Dressing: Promogran Prisma Matrix, 4.34 (sq in) (silver collagen) ary Discharge Instructions: Moisten collagen with hydrogel Secondary Dressing: Zetuvit Plus Silicone Border Dressing 4x4 (in/in) Discharge Instructions: Apply silicone border over primary dressing as directed. Secured With: Coban Self-Adherent Wrap 4x5 (in/yd) Discharge Instructions: Secure with Coban as directed. Secured With: [employer], 4.5x3.1 (in/yd) Discharge Instructions: Secure with Kerlix as directed. 02/07/2023: The wound on his shoulder is healed. The dorsal foot wound continues to be subjected to excessive moisture due to the home health nurses applying the drape from his wound VAC over the top of it. It does measure smaller, however. There is more bone exposed on his calcaneus, unfortunately. He has heaped up wet callus around the edges of the heel wound again. I used a curette to debride slough, nonviable subcutaneous tissue, callus, and epidermis from the heel wound. No debridement was necessary for the dorsal foot wound. We will continue Keystone topical antibiotic compound to both sites, with the wound VAC to the calcaneus and Prisma silver collagen to the dorsal foot. We have reiterated once again in our wound care orders that the drape should not be applied over the top of his dorsal foot wound. Continue hyperbaric oxygen therapy. Follow-up in 1 week. Electronic Signature(s) Signed: 02/07/2023 9:31:22 AM By: Fredirick Maudlin MD FACS [place], My[place] 02/07/2023 9:31:22 AM By: Fredirick Maudlin MD FACS Signed: R ([id number]) [id number] Page 10 of 12 Entered By: Fredirick Maudlin on 02/07/2023  09:31:22 -------------------------------------------------------------------------------- HxROS Details Patient Name:  Date of Service: [place], [name] 02/07/2023 8:30 A M Medical Record Number: [id number] Patient Account Number: [id number] Date of Birth/Sex: Treating RN: 1956-02-06 (67 y.o. M) Primary Care Provider: Nelda Bucks Other Clinician: Referring Provider: Treating Provider/Extender: Doristine Bosworth in Treatment: 21 Eyes Medical History: Positive for: Cataracts Negative for: Glaucoma; Optic Neuritis Ear/Nose/Mouth/Throat Medical History: Negative for: Chronic sinus problems/congestion; Middle ear problems Respiratory Medical History: Positive for: Sleep Apnea Cardiovascular Medical History: Positive for: Hypertension Past Medical History Notes: hyperlipidemia Gastrointestinal Medical History: Negative for: Cirrhosis ; Colitis; Crohns; Hepatitis A; Hepatitis B; Hepatitis C Endocrine Medical History: Positive for: Type II Diabetes Time with diabetes: 20 years Treated with: Insulin Blood sugar tested every day: Yes Tested : 2-3 Genitourinary Medical History: Past Medical History Notes: AKI Immunological Medical History: Negative for: Lupus Erythematosus; Raynauds; Scleroderma Musculoskeletal Medical History: Positive for: Osteomyelitis - 2023 Neurologic Medical History: Positive for: Neuropathy - Bila lower extremities Oncologic Medical History: Negative for: Received Chemotherapy; Received [name], Corn[place] ([id number]) [id number] Page 11 of 12 Medical History: Negative for: Anorexia/bulimia; Confinement Anxiety HBO Extended History Items Eyes: Cataracts Immunizations Pneumococcal Vaccine: Received Pneumococcal Vaccination: Yes Received Pneumococcal Vaccination On or After 60th Birthday: Yes Implantable Devices Yes Hospitalization / Surgery History Type of  Hospitalization/Surgery I and D Left calcaneus back surgery- laminectomy eye surgery- Bila cataracts shoulder arthroscopy Family and Social History Cancer: Yes - Father,Mother; Diabetes: Yes - Mother,Father; Heart Disease: No; Hereditary Spherocytosis: No; Hypertension: No; Kidney Disease: No; Lung Disease: Yes - Father; Seizures: No; Stroke: No; Thyroid Problems: Yes - Mother; Tuberculosis: No; Never smoker; Marital Status - Married; Alcohol Use: Never; Drug Use: No History; Caffeine Use: Daily; Financial Concerns: No; Food, Clothing or Shelter Needs: No; Support System Lacking: No; Transportation Concerns: No Electronic Signature(s) Signed: 02/07/2023 12:01:26 PM By: Fredirick Maudlin MD FACS Entered By: Fredirick Maudlin on 02/07/2023 09:29:33 -------------------------------------------------------------------------------- SuperBill Details Patient Name: Date of Service: [name], [name] 02/07/2023 Medical Record Number: [id number] Patient Account Number: [id number] Date of Birth/Sex: Treating RN: 04-Jul-1956 (67 y.o. 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.523 Non-pressure chronic ulcer of other part of left foot with necrosis of 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, left ankle and foot E11.65 Type 2 diabetes mellitus with hyperglycemia E11.621 Type 2 diabetes mellitus with foot ulcer Facility Procedures : The patient participates with Medicare or their insurance follows the Medicare Facility Guidelines: CPT4 Code Description Modifier Quantity [id number] - DEB SUBQ TISSUE 20 SQ CM/< 1 ICD-10 Diagnosis Description L97.424 Non-pressure chronic ulcer of  left heel and midfoot with necrosis of bone : The patient  participates with Medicare or their insurance follows the Medicare Facility Guidelines: [id number] 11045 - DEB SUBQ TISS EA ADDL 20CM 1 ICD-10 Diagnosis Description L97.424 Non-pressure chronic ulcer of left heel and midfoot with necrosis of  bone : The patient participates with Medicare or their insurance follows the Medicare Facility Guidelines: [id number] [phone] - WOUND VAC-50 SQ CM OR LESS 1 Physician Procedures MACYN, HAEGER ([id number]): CPT4 Code Description [id number] - WC PHYS LEVEL 4 - EST PT ICD-10 Diagnosis Description L97.424 Non-pressure chronic ulcer of left heel and midfoot with necros L97.523 Non-pressure chronic ulcer of other part of left foot  with necr 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, left ankle and foot E11.621  Type 2 diabetes mellitus with foot ulcer [id number] Page 12 of 12: Quantity Modifier 25 1 is of bone osis of muscle CANTON, BAYLES R ([id number]): [id number] 11042 - WC PHYS SUBQ TISS 20 SQ CM ICD-10 Diagnosis Description L97.424 Non-pressure chronic ulcer of left heel and midfoot with necros [id number] Page 12 of 12: 1 is of bone AVONTAE, BRUNI ([id number]): [id number] 11045 - WC PHYS SUBQ TISS EA ADDL 20 CM ICD-10 Diagnosis Description L97.424 Non-pressure chronic ulcer of left heel and midfoot with necros [id number] Page 12 of 12: 1 is of bone Electronic Signature(s) Signed: 02/07/2023 9:31:50 AM By: Fredirick Maudlin MD FACS Entered By: Fredirick Maudlin on 02/07/2023 09:31:49

## 2023-02-09 ENCOUNTER — Encounter (HOSPITAL_BASED_OUTPATIENT_CLINIC_OR_DEPARTMENT_OTHER): Payer: Medicare Other | Admitting: General Surgery

## 2023-02-09 DIAGNOSIS — E11621 Type 2 diabetes mellitus with foot ulcer: Secondary | ICD-10-CM | POA: Diagnosis not present

## 2023-02-09 LAB — GLUCOSE, CAPILLARY
Glucose-Capillary: 207 mg/dL — ABNORMAL HIGH (ref 70–99)
Glucose-Capillary: 283 mg/dL — ABNORMAL HIGH (ref 70–99)
Glucose-Capillary: 235 mg/dL — ABNORMAL HIGH (ref 70–99)

## 2023-02-10 ENCOUNTER — Telehealth: Payer: Self-pay

## 2023-02-10 ENCOUNTER — Encounter (HOSPITAL_BASED_OUTPATIENT_CLINIC_OR_DEPARTMENT_OTHER): Payer: Medicare Other | Admitting: General Surgery

## 2023-02-10 NOTE — Progress Notes (Signed)
EDMON, EVILSIZER R ([id number]) [id number] Page 1 of 2 Visit Report for 02/09/2023 HBO Details Patient Name: Date of Service: [place], [name] 02/09/2023 10:00 A M Medical Record Number: [id number] Patient Account Number: [id number] Date of Birth/Sex: Treating RN: 1956-10-15 (67 y.o. 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 HBO Treatment Details Treatment Number: 55 Patient Type: Outpatient Chamber Type: Monoplace Chamber Serial #: [id number] Treatment Protocol: 2.5 ATA with 90 minutes oxygen, with two 5 minute air breaks Treatment Details Compression Rate Down: 1.5 psi / minute De-Compression Rate Up: 2.0 psi / minute A breaks and breathing ir Compress Tx Pressure periods Decompress Decompress Begins Reached (leave unused spaces Begins Ends blank) Chamber Pressure (ATA 1 2.5 2.5 2.5 2.5 2.5 - - 2.5 1 ) Clock Time (24 hr) 10:30 10:43 11:13 11:18 11:48 11:53 - - 12:23 12:34 Treatment Length: 124 (minutes) Treatment Segments: 4 Vital Signs Capillary Blood Glucose Reference Range: 80 - 120 mg / dl HBO Diabetic Blood Glucose Intervention Range: <131 mg/dl or >249 mg/dl Type: Time Vitals Blood Respiratory Capillary Blood Glucose Pulse Action Pulse: Temperature: Taken: Pressure: Rate: Glucose (mg/dl): Meter #: Oximetry (%) Taken: Pre 10:21 113/60 87 18 97 207 1 none per protocol Post 12:44 147/72 78 18 97.5 235 none per protocol Pre 10:24 283 1 (taken at pt request) Treatment Response Treatment Toleration: Well Treatment Completion Status: Treatment Completed without Adverse  Event Treatment Notes Patient arrived, blood glucose level measured at 207 mg/dL. Patient stated that his blood glucose was 183 before leaving home so he consumed a [employer] drink and a snack cake. At 0100 patient self-administered "90 units of long-acting" insulin and "9 units of fast-acting" insulin. Patient prepared for treatment. Chamber side, patient requested that his blood glucose be re-measured as he was worried about his blood glucose decreasing. After performing safety check, patient was placed in the chamber which was compressed at the rate of 1 psi/min until confirming normal ear equalization and then it was increased to 2 psi/min until reaching treatment depth of 2.5 ATA. Patient tolerated treatment and decompression at the rate of 2 psi/min. Post treatment vitals were normal. Patient was stable upon discharge. Physician HBO Attestation: I certify that I supervised this HBO treatment in accordance with Medicare guidelines. A trained emergency response team is readily available per Yes hospital policies and procedures. Continue HBOT as ordered. Yes Electronic Signature(s) Signed: 02/09/2023 4:13:52 PM By: Fredirick Maudlin MD FACS Previous Signature: 02/09/2023 2:59:59 PM Version By: Donavan Burnet CHT EMT BS , , Previous Signature: 02/09/2023 1:34:34 PM Version By: Donavan Burnet CHT EMT BS , , Previous Signature: 02/09/2023 12:21:56 PM Version By: Donavan Burnet CHT EMT BS , , Entered By: Fredirick Maudlin on 02/09/2023 16:13:52 HBO Safety Checklist Details -------------------------------------------------------------------------------- [name] ([id number].pdf Page 2 of 2 Patient Name: Date of Service: [place], [name] 02/09/2023 10:00 A M Medical Record Number: [id number] Patient Account Number: [id number] Date of Birth/Sex: Treating RN: [birth year] (67 y.o. [name] Primary Care Jaszmine Navejas: Nelda Bucks Other Clinician: Donavan Burnet Referring Georgeanna Radziewicz: Treating Jamarien Rodkey/Extender: Doristine Bosworth in Treatment: 21 HBO Safety Checklist Items Safety Checklist Consent Form Signed Patient voided / foley secured and emptied When did  you last eato 0910 Last dose of injectable or oral agent 0100 Ostomy pouch emptied and vented if applicable NA All implantable devices assessed, documented and approved NA Intravenous access site secured and place NA Valuables secured Linens and cotton and cotton/polyester blend (less than 51% polyester) Personal oil-based products / skin lotions / body lotions removed Wigs or hairpieces removed NA Smoking or tobacco materials removed NA Books / newspapers / magazines / loose paper removed Cologne, aftershave, perfume and deodorant removed Jewelry removed (may wrap wedding band) Make-up removed NA Hair care products removed Battery operated devices (external) removed Heating patches and chemical warmers removed Titanium eyewear removed Nail polish cured greater than 10 hours NA Casting material cured greater than 10 hours NA Hearing aids removed NA Loose dentures or partials removed dentures removed Prosthetics have been removed NA Patient demonstrates correct use of air break device (if applicable) Patient concerns have been addressed Patient grounding bracelet on and cord attached to chamber Specifics for Inpatients (complete in addition to above) Medication sheet sent with patient NA Intravenous medications needed or due during therapy sent with patient NA Drainage tubes (e.g. nasogastric tube or chest tube secured and vented) NA Endotracheal or Tracheotomy tube secured NA Cuff deflated of air and inflated with saline NA Airway suctioned NA Notes Paper version used prior to treatment start. Electronic Signature(s) Signed: 02/09/2023 12:21:24 PM By: Donavan Burnet CHT EMT BS , , Entered By:  Donavan Burnet on 02/09/2023 12:21:24

## 2023-02-10 NOTE — Progress Notes (Signed)
SIONE, MEINHOLZ [name] ([id number]) [id number] Page 1 of 1 Visit Report for 02/09/2023 SuperBill Details Patient Name: Date of Service: [name], [name]. 02/09/2023 Medical Record Number: [id number] Patient Account Number: [id number] Date of Birth/Sex: Treating RN: 07/02/1956 (66 y.o. 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, left ankle and foot E11.65 Type 2 diabetes mellitus with hyperglycemia E11.621 Type 2 diabetes mellitus with foot ulcer Facility Procedures The patient participates with Medicare or their insurance follows the Medicare Facility Guidelines CPT4 Code Description Modifier Quantity [id number]-(Facility Use Only) HBOT full body chamber, 23mn , 4 ICD-10 Diagnosis Description [id number] chronic osteomyelitis, 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 Physician Procedures Quantity CPT4 Code Description Modifier [id number] PHYS HYPERBARIC OXYGEN THERAPY 1 ICD-10 Diagnosis Description 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, 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

## 2023-02-10 NOTE — Telephone Encounter (Signed)
Received a call from patient regarding aortogram procedure scheduled for tomorrow. 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 Patient requested to call office back to reschedule after arranging transportation. MD made aware.

## 2023-02-10 NOTE — Progress Notes (Signed)
[name] ([id number]) [id number] Page 1 of 2 Visit Report for 02/09/2023 Arrival Information Details Patient Name: Date of Service: [name], [name] 02/09/2023 10:00 A M Medical Record Number: [id number] Patient Account Number: [id number] Date of Birth/Sex: Treating RN: May 15, 1956 (67 y.o. [name], [name] Primary Care Courtland Coppa: Nelda Bucks Other Clinician: Donavan Burnet Referring Keene Gilkey: Treating Marquisha Nikolov/Extender: Doristine Bosworth in Treatment: 21 Visit Information History Since Last Visit All ordered tests and consults were completed: Yes Patient Arrived: Knee Scooter Added or deleted any medications: No Arrival Time: 09:30 Any new allergies or adverse reactions: No Accompanied By: self Had a fall or experienced change in No Transfer Assistance: None activities of daily living that may affect Patient Identification Verified: Yes risk of falls: Secondary Verification Process Completed: Yes Signs or symptoms of abuse/neglect since last visito No Patient Requires Transmission-Based Precautions: No Hospitalized since last visit: No Patient Has Alerts: No Implantable device outside of the clinic excluding No cellular tissue based products placed in the center since last visit: Pain Present Now: No Electronic Signature(s) Signed: 02/09/2023 12:19:50 PM By: Donavan Burnet CHT EMT BS , , Entered By: Donavan Burnet on 02/09/2023 12:19:49 -------------------------------------------------------------------------------- Encounter Discharge Information Details Patient Name: Date of Service: [name], [name] 02/09/2023 10:00 A M Medical Record Number: [id number] Patient Account Number: [id number] Date of Birth/Sex: Treating RN: May 28, 1956 (67 y.o. [name] Primary Care Alexyia Guarino: Nelda Bucks Other Clinician: Donavan Burnet Referring Brittnie Lewey: Treating Quinteria Chisum/Extender: Doristine Bosworth in Treatment: 21 Encounter Discharge Information Items Discharge Condition: Stable Ambulatory Status: Knee Scooter Discharge Destination: Home Transportation: Private Auto Accompanied By: self Schedule Follow-up Appointment: No Clinical Summary of Care: Electronic Signature(s) Signed: 02/09/2023 3:01:22 PM By: Donavan Burnet CHT EMT BS , , Entered By: Donavan Burnet on 02/09/2023 15:01:22 -------------------------------------------------------------------------------- [place] Details Patient Name: Date of Service: [name], [name] 02/09/2023 10:00 A M Medical Record Number: [id number] Patient Account Number: [id number] Date of Birth/Sex: Treating RN: 12/11/1956 (67 y.o. [name] Primary Care Marquis Down: Nelda Bucks Other Clinician: Donavan Burnet Referring Miyoshi Ligas: Treating Sione Baumgarten/Extender: Doristine Bosworth in Treatment: 21 Vital Signs Time Taken: 10:21 Temperature (F): 97.0 [name] ([id number]) [id number] Page 2 of 2 Height (in): 74 Pulse (bpm): 87 Weight (lbs): 245 Respiratory Rate (breaths/min): 18 Body Mass Index (BMI): 31.5 Blood Pressure (mmHg): 113/60 Capillary Blood Glucose (mg/dl): 207 Reference Range: 80 - 120 mg / dl Electronic Signature(s) Signed: 02/09/2023 12:20:23 PM By: Donavan Burnet CHT EMT BS , , Entered By: Donavan Burnet on 02/09/2023 12:20:23

## 2023-02-11 ENCOUNTER — Ambulatory Visit (HOSPITAL_COMMUNITY): Admission: RE | Admit: 2023-02-11 | Payer: Medicare Other | Source: Home / Self Care | Admitting: Vascular Surgery

## 2023-02-11 ENCOUNTER — Encounter (HOSPITAL_COMMUNITY): Admission: RE | Payer: Self-pay | Source: Home / Self Care

## 2023-02-11 SURGERY — ABDOMINAL AORTOGRAM W/LOWER EXTREMITY
Anesthesia: LOCAL

## 2023-02-11 NOTE — Telephone Encounter (Signed)
Received a telephone call from patient requesting to reschedule aortogram for March 8 and that he should have transportation available. Instructions provided and patient verbalized understanding.

## 2023-02-14 ENCOUNTER — Encounter (HOSPITAL_BASED_OUTPATIENT_CLINIC_OR_DEPARTMENT_OTHER): Payer: Self-pay

## 2023-02-14 ENCOUNTER — Encounter (HOSPITAL_BASED_OUTPATIENT_CLINIC_OR_DEPARTMENT_OTHER): Payer: Medicare Other | Attending: General Surgery | Admitting: General Surgery

## 2023-02-14 ENCOUNTER — Encounter (HOSPITAL_BASED_OUTPATIENT_CLINIC_OR_DEPARTMENT_OTHER): Payer: Medicare Other | Admitting: General Surgery

## 2023-02-14 DIAGNOSIS — E11621 Type 2 diabetes mellitus with foot ulcer: Secondary | ICD-10-CM | POA: Insufficient documentation

## 2023-02-14 DIAGNOSIS — L97523 Non-pressure chronic ulcer of other part of left foot with necrosis of muscle: Secondary | ICD-10-CM | POA: Diagnosis not present

## 2023-02-14 DIAGNOSIS — L97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: Secondary | ICD-10-CM | POA: Insufficient documentation

## 2023-02-14 DIAGNOSIS — E1165 Type 2 diabetes mellitus with hyperglycemia: Secondary | ICD-10-CM | POA: Diagnosis not present

## 2023-02-14 DIAGNOSIS — E1169 Type 2 diabetes mellitus with other specified complication: Secondary | ICD-10-CM | POA: Diagnosis not present

## 2023-02-14 DIAGNOSIS — M86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, left ankle and foot: Secondary | ICD-10-CM | POA: Insufficient documentation

## 2023-02-14 LAB — GLUCOSE, CAPILLARY: Glucose-Capillary: 194 mg/dL — ABNORMAL HIGH (ref 70–99)

## 2023-02-15 NOTE — Progress Notes (Signed)
[name], [name] ([id number]) [id number] Page 1 of 11 Visit Report for 02/14/2023 Arrival Information Details Patient Name: Date of Service: [name], [name] [name] 02/14/2023 9:15 A M Medical Record Number: [id number] Patient Account Number: [id number] Date of Birth/Sex: Treating RN: Nov 02, 1956 (67 y.o. [name], [name] Primary Care Rayah Fines: [name] Other Clinician: Referring Earnestine Tuohey: Treating Edker Punt/Extender: Doristine Bosworth in Treatment: 48 Visit Information History Since Last Visit Added or deleted any medications: No Patient Arrived: Knee Scooter Any new allergies or adverse reactions: No Arrival Time: 09:14 Had a fall or experienced change in No Accompanied By: self activities of daily living that may affect Transfer Assistance: None risk of falls: Patient Identification Verified: Yes Signs or symptoms of abuse/neglect since last visito No Secondary Verification Process Completed: Yes Hospitalized since last visit: No Patient Requires Transmission-Based Precautions: No Implantable device outside of the clinic excluding No Patient Has Alerts: No cellular tissue based products placed in the center since last visit: Has Dressing in Place as Prescribed: Yes Pain Present Now: No Electronic Signature(s) Signed: 02/14/2023 4:55:03 PM By: Blanche East RN Entered By: Blanche East on 02/14/2023 09:15:00 -------------------------------------------------------------------------------- Encounter Discharge Information Details Patient Name: Date of Service: [name], [name] NA LD R. 02/14/2023 9:15 A M Medical Record Number: [id number] Patient Account Number: [id number] Date of Birth/Sex: Treating RN: Feb 23, 1956 (67 y.o. [name] Primary Care Melea Prezioso: [name] Other Clinician: Referring Haly Feher: Treating Nyxon Strupp/Extender: Doristine Bosworth in Treatment: 22 Encounter Discharge Information Items Post  Procedure Vitals Discharge Condition: Stable Temperature (F): 98.4 Ambulatory Status: Knee Scooter Pulse (bpm): 75 Discharge Destination: Home Respiratory Rate (breaths/min): 16 Transportation: Private Auto Blood Pressure (mmHg): 155/77 Accompanied By: self Schedule Follow-up Appointment: Yes Clinical Summary of Care: Electronic Signature(s) Signed: 02/14/2023 4:55:03 PM By: Blanche East RN Entered By: Blanche East on 02/14/2023 09:49:52 -------------------------------------------------------------------------------- Lower Extremity Assessment Details Patient Name: Date of Service: [name], [name] NA LD R. 02/14/2023 9:15 A M Medical Record Number: [id number] Patient Account Number: [id number] Date of Birth/Sex: Treating RN: March 03, 1956 (67 y.o. [name] Primary Care Zayan Delvecchio: [name] Other Clinician: Referring Jody Aguinaga: Treating Ulmer Degen/Extender: Johnnette Gourd Weeks in Treatment: 22 Edema Assessment Assessed: [name]: No] Patrice [name]: No] S[LeftDUAINE, LOCURTO [id number] [Right: [id number] Page 2 of 11] Edema: [Left: Ye] [Right: s] Calf Left: Right: Point of Measurement: From Medial Instep 47 cm Ankle Left: Right: Point of Measurement: From Medial Instep 25 cm Vascular Assessment Pulses: Dorsalis Pedis Palpable: [Left:Yes] Electronic Signature(s) Signed: 02/14/2023 4:55:03 PM By: Blanche East RN Entered By: Blanche East on 02/14/2023 09:19:12 -------------------------------------------------------------------------------- Multi Wound Chart Details Patient Name: Date of Service: [name], [name] NA LD R. 02/14/2023 9:15 A M Medical Record Number: [id number] Patient Account Number: [id number] Date of Birth/Sex: Treating RN: October 08, 1956 (67 y.o. M) Primary Care Jamond Neels: [name] Other Clinician: Referring Amour Cutrone: Treating Inaya Gillham/Extender: Doristine Bosworth in Treatment: 22 Vital  Signs Height(in): 74 Capillary Blood Glucose(mg/dl): 194 Weight(lbs): 245 Pulse(bpm): 75 Body Mass Index(BMI): 31.5 Blood Pressure(mmHg): 155/77 Temperature(F): 98.4 Respiratory Rate(breaths/min): 16 [1:Photos:] [2:No Photos] Left Calcaneus Left, Dorsal Foot Left, Dorsal Foot Wound Location: Pressure Injury Pressure Injury Pressure Injury Wounding Event: Diabetic Wound/Ulcer of the Lower Diabetic Wound/Ulcer of the Lower Diabetic Wound/Ulcer of the Lower Primary Etiology: Extremity Extremity Extremity Cataracts, Sleep Apnea, Hypertension, Cataracts, Sleep Apnea, Hypertension, Cataracts, Sleep Apnea, Hypertension, Comorbid History: Type II Diabetes, Osteomyelitis, Type II Diabetes, Osteomyelitis, Type II Diabetes, Osteomyelitis, Neuropathy Neuropathy Neuropathy 03/09/2019 07/09/2022 07/09/2022 Date Acquired: 48 22  22 Weeks of Treatment: Open Open Open Wound Status: No No No Wound Recurrence: 7x4.8x0.3 3.4x3x0.1 3.4x3x0.1 Measurements L x W x D (cm) 26.389 8.011 8.011 A (cm) : rea 7.917 0.801 0.801 Volume (cm) : -22.20% 15.00% 15.00% % Reduction in A rea: 63.30% 15.00% 15.00% % Reduction in Volume: 1 Position 1 (o'clock): 2 Maximum Distance 1 (cm): 11 Position 2 (o'clock): 1.5 Maximum Distance 2 (cm): 1 Starting Position 1 (o'clock): 11 Ending Position 1 (o'clock): 1 Maximum Distance 1 (cm): Yes No No Tunneling: Yes No No UnderminingLUISALFREDO, [name] ([id number]) [id number] Page 3 of 11 Grade 3 Grade 2 Grade 2 Classification: Large Medium Medium Exudate A mount: Serosanguineous Serosanguineous Serosanguineous Exudate Type: red, brown red, brown red, brown Exudate Color: Thickened Flat and Intact Flat and Intact Wound Margin: Medium (34-66%) Medium (34-66%) Medium (34-66%) Granulation A mount: Red, Pale Red, Pink Red, Pink Granulation Quality: Medium (34-66%) Medium (34-66%) Medium (34-66%) Necrotic A mount: Eschar,  Adherent [place] Necrotic Tissue: Fat Layer (Subcutaneous Tissue): Yes Fat Layer (Subcutaneous Tissue): Yes Fat Layer (Subcutaneous Tissue): Yes Exposed Structures: Bone: Yes Fascia: No Fascia: No Fascia: No Tendon: No Tendon: No Tendon: No Muscle: No Muscle: No Muscle: No Joint: No Joint: No Joint: No Bone: No Bone: No Small (1-33%) Small (1-33%) Small (1-33%) Epithelialization: Debridement - Selective/Open Wound Debridement - Selective/Open Wound Debridement - Selective/Open Wound Debridement: Pre-procedure Verification/Time Out 09:41 09:41 09:41 Taken: [facility] Tissue Debrided: Skin/Epidermis Non-Viable Tissue Non-Viable Tissue Level: 33.6 10.2 10.2 Debridement A (sq cm): rea Curette Curette Curette Instrument: Minimum Minimum Minimum Bleeding: Pressure Pressure Pressure Hemostasis A chieved: Procedure was tolerated well Procedure was tolerated well Procedure was tolerated well Debridement Treatment Response: 7x4.8x0.3 3.4x3x0.1 3.4x3x0.1 Post Debridement Measurements L x W x D (cm) 7.917 0.801 0.801 Post Debridement Volume: (cm) Callus: Yes Scarring: Yes Scarring: Yes Periwound Skin Texture: Maceration: Yes Maceration: Yes Maceration: Yes Periwound Skin Moisture: Dry/Scaly: No Dry/Scaly: No Dry/Scaly: No No Abnormalities Noted No Abnormalities Noted No Abnormalities Noted Periwound Skin Color: No Abnormality No Abnormality No Abnormality Temperature: Debridement Debridement Debridement Procedures Performed: Negative Pressure Wound Therapy Maintenance (NPWT) Treatment Notes Wound #1 (Calcaneus) Wound Laterality: Left Cleanser Normal Saline Discharge Instruction: Cleanse the wound with Normal Saline prior to applying a clean dressing using gauze sponges, not tissue or cotton balls. Soap and Water Discharge Instruction: May shower and wash wound with dial antibacterial soap and water prior to dressing  change. Peri-Wound Care keystone Topical Primary Dressing VAC Secondary Dressing Secured With Coban Self-Adherent Wrap 4x5 (in/yd) Discharge Instruction: Secure with Coban as directed. Kerlix Roll Sterile, 4.5x3.1 (in/yd) Discharge Instruction: Secure with Kerlix as directed. Compression Wrap Compression Stockings Add-Ons Wound #2 (Foot) Wound Laterality: Dorsal, Left Cleanser Normal Saline Discharge Instruction: Cleanse the wound with Normal Saline prior to applying a clean dressing using gauze sponges, not tissue or cotton balls. Soap and Water Discharge Instruction: May shower and wash wound with dial antibacterial soap and water prior to dressing change. Peri-Wound Care [place], [place] R ([id number]) [id number] Page 4 of 11 Topical Primary Dressing Promogran Prisma Matrix, 4.34 (sq in) (silver collagen) Discharge Instruction: Moisten collagen with hydrogel Secondary Dressing Zetuvit Plus Silicone Border Dressing 4x4 (in/in) Discharge Instruction: Apply silicone border over primary dressing as directed. Secured With [employer] 4x5 (in/yd) Discharge Instruction: Secure with Coban as directed. Kerlix Roll Sterile, 4.5x3.1 (in/yd) Discharge Instruction: Secure with Kerlix as directed. Compression Wrap Compression Stockings Add-Ons Electronic Signature(s) Signed: 02/14/2023 10:56:10 AM By:  Fredirick Maudlin MD FACS Entered By: Fredirick Maudlin on 02/14/2023 10:56:10 -------------------------------------------------------------------------------- Multi-Disciplinary Care Plan Details Patient Name: Date of Service: [place], [name] [name] 02/14/2023 9:15 A M Medical Record Number: [id number] Patient Account Number: [id number] Date of Birth/Sex: Treating RN: 12-28-1955 (67 y.o. 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 and treatment procedures Potential for barotraumas to ears, sinuses, teeth, and lungs or cerebral gas embolism related to changes in atmospheric pressure inside hyperbaric oxygen chamber Potential for oxygen toxicity seizures related to delivery of 100% oxygen at an increased atmospheric pressure Potential for pulmonary oxygen toxicity related to delivery of 100% oxygen at an increased atmospheric pressure Goals: Barotrauma will be prevented during HBO2 Date Initiated: 10/06/2022 Target Resolution Date: 03/14/2023 Goal Status: Active Patient and/or family will be able to state/discuss factors appropriate to the management of their disease process during treatment Date Initiated: 10/06/2022 Target Resolution Date: 03/14/2023 Goal Status: Active Patient will tolerate the hyperbaric oxygen therapy treatment Date Initiated: 10/06/2022 Target Resolution Date: 03/14/2023 Goal Status: Active Patient/caregiver will verbalize understanding of HBO goals, rationale, procedures and potential hazards Date Initiated: 10/06/2022 Target Resolution Date: 03/14/2023 Goal Status: Active Interventions: Administer decongestants, per physician orders, prior to HBO2 Administer the correct therapeutic gas delivery based on the patients needs and limitations, per physician order Assess and provide for patients comfort related to the hyperbaric environment and equalization of middle ear Assess for signs and symptoms related to adverse events, including but not limited to confinement anxiety, pneumothorax, oxygen toxicity and baurotrauma NotesANTWAUN, [name] ([id number]) [id number] Page 5 of 11 Wound/Skin Impairment Nursing Diagnoses: Impaired tissue integrity Goals: Patient/caregiver will verbalize  understanding of skin care regimen Date Initiated: 10/06/2022 Target Resolution Date: 03/10/2023 Goal Status: Active Ulcer/skin breakdown will have a volume reduction of 30% by week 4 Date Initiated: 09/10/2022 Date Inactivated: 10/06/2022 Target Resolution Date: 10/08/2022 Goal Status: Unmet Unmet Reason: osteo, HBOT Ulcer/skin breakdown will have a volume reduction of 50% by week 8 Date Initiated: 10/06/2022 Date Inactivated: 02/14/2023 Target Resolution Date: 01/14/2023 Unmet Reason: insufficient perfusion Goal Status: Unmet to Left lower extremity Ulcer/skin breakdown will have a volume reduction of 80% by week 12 Date Initiated: 02/14/2023 Target Resolution Date: 03/15/2023 Goal Status: Active Interventions: Assess ulceration(s) every visit Provide education on ulcer and skin care Treatment Activities: Consult for HBO : 09/10/2022 Skin care regimen initiated : 09/10/2022 Notes: NPWT started 12/27/22 Electronic Signature(s) Signed: 02/14/2023 9:37:50 AM By: Blanche East RN Entered By: Blanche East on 02/14/2023 09:37:50 -------------------------------------------------------------------------------- Negative Pressure Wound Therapy Maintenance (NPWT) Details Patient Name: Date of Service: [name], [name] [name] 02/14/2023 9:15 A M Medical Record Number: [id number] Patient Account Number: [id number] Date of Birth/Sex: Treating RN: 1956/09/29 (67 y.o. [name] Primary Care Zaid Tomes: [name] Other Clinician: Referring Jevonte Clanton: Treating Tyrail Grandfield/Extender: Doristine Bosworth in Treatment: 22 NPWT Maintenance Performed for: Wound #1 Left Calcaneus Additional Injuries Covered: No Performed By: Blanche East, RN Type: VAC System Coverage Size (sq cm): 33.6 Pressure Type: Constant Pressure Setting: 125 mmHG Drain Type: None Primary Contact: Non-Adherent Sponge/Dressing Type: Foam- Black Date Initiated: 12/27/2022 Dressing Removed: Yes Quantity of  Sponges/Gauze Removed: 1 Canister Changed: Yes Dressing Reapplied: Yes Quantity of Sponges/Gauze Inserted: 1 Days On NPWT : 50 Post Procedure Diagnosis Same as Pre-procedure Electronic Signature(s)  Signed: 02/14/2023 4:55:03 PM By: Blanche East RN Entered By: Blanche East on 02/14/2023 09:48:00 [name] ([id number]) [id number] Page 6 of 11 -------------------------------------------------------------------------------- Pain Assessment Details Patient Name: Date of Service: [place], [name] [name] 02/14/2023 9:15 A M Medical Record Number: [id number] Patient Account Number: [id number] Date of Birth/Sex: Treating RN: 11/12/1956 (67 y.o. [name] Primary Care Katalyna Socarras: [name] Other Clinician: Referring Kerrigan Glendening: Treating Ceira Hoeschen/Extender: Doristine Bosworth in Treatment: 22 Active Problems Location of Pain Severity and Description of Pain Patient Has Paino No Site Locations Pain Management and Medication Current Pain Management: Electronic Signature(s) Signed: 02/14/2023 4:55:03 PM By: Blanche East RN Entered By: Blanche East on 02/14/2023 09:17:38 -------------------------------------------------------------------------------- Patient/Caregiver Education Details Patient Name: Date of Service: [name] NA LD R. 3/4/2024andnbsp9:15 A M Medical Record Number: [id number] Patient Account Number: [id number] Date of Birth/Gender: Treating RN: October 24, 1956 (67 y.o. [name] Primary Care Physician: [name] Other Clinician: Referring Physician: Treating Physician/Extender: Doristine Bosworth in Treatment: 22 Education Assessment Education Provided To: Patient Education Topics Provided Wound Debridement: Methods: Explain/Verbal Responses: Reinforcements needed, State content correctly Wound/Skin Impairment: Methods: Explain/Verbal Responses: Reinforcements needed, State content  correctly Electronic Signature(s) Signed: 02/14/2023 4:55:03 PM By: Blanche East RN Entered By: Blanche East on 02/14/2023 12:34:41 [name] ([id number]) [id number] Page 7 of 11 -------------------------------------------------------------------------------- Wound Assessment Details Patient Name: Date of Service: [place], [name] [name] 02/14/2023 9:15 A M Medical Record Number: [id number] Patient Account Number: [id number] Date of Birth/Sex: Treating RN: October 01, 1956 (67 y.o. [name] Primary Care Horacio Werth: [name] Other Clinician: Referring Shalisa Mcquade: Treating Kaybree Williams/Extender: Doristine Bosworth in Treatment: 22 Wound Status Wound Number: 1 Primary Diabetic Wound/Ulcer of the Lower Extremity Etiology: Wound Location: Left Calcaneus Wound Open Wounding Event: Pressure Injury Status: Date Acquired: 03/09/2019 Comorbid Cataracts, Sleep Apnea, Hypertension, Type II Diabetes, Weeks Of Treatment: 22 History: Osteomyelitis, Neuropathy Clustered Wound: No Photos Wound Measurements Length: (cm) 7 Width: (cm) 4.8 Depth: (cm) 0.3 Area: (cm) 26.389 Volume: (cm) 7.917 % Reduction in Area: -22.2% % Reduction in Volume: 63.3% Epithelialization: Small (1-33%) Tunneling: Yes Location 1 Position (o'clock): 1 Maximum Distance: (cm) 2 Location 2 Position (o'clock): 11 Maximum Distance: (cm) 1.5 Undermining: Yes Starting Position (o'clock): 1 Ending Position (o'clock): 11 Maximum Distance: (cm) 1 Wound Description Classification: Grade 3 Wound Margin: Thickened Exudate Amount: Large Exudate Type: Serosanguineous Exudate Color: red, brown Foul Odor After Cleansing: No Slough/Fibrino Yes Wound Bed Granulation Amount: Medium (34-66%) Exposed Structure Granulation Quality: Red, Pale Fascia Exposed: No Necrotic Amount: Medium (34-66%) Fat Layer (Subcutaneous Tissue) Exposed: Yes Necrotic Quality: Eschar, Adherent  Slough Tendon Exposed: No Muscle Exposed: No Joint Exposed: No Bone Exposed: Yes Periwound Skin Texture Texture Color No Abnormalities Noted: No No Abnormalities Noted: Yes Callus: Yes Temperature / Pain Temperature: No Abnormality Moisture No Abnormalities Noted: No [name], CALDEIRA ([id number]) [id number] Page 8 of 11 Dry / Scaly: No Maceration: Yes Treatment Notes Wound #1 (Calcaneus) Wound Laterality: Left Cleanser Normal Saline Discharge Instruction: Cleanse the wound with Normal Saline prior to applying a clean dressing using gauze sponges, not tissue or cotton balls. Soap and Water Discharge Instruction: May shower and wash wound with dial antibacterial soap and water prior to dressing change. Peri-Wound Care keystone Topical Primary Dressing VAC Secondary Dressing Secured With Coban Self-Adherent Wrap 4x5 (in/yd) Discharge Instruction: Secure with Coban as directed. Kerlix Roll Sterile, 4.5x3.1 (in/yd) Discharge Instruction: Secure with Kerlix as directed. Compression Wrap Compression Stockings Add-Ons Electronic  Signature(s) Signed: 02/14/2023 4:55:03 PM By: Blanche East RN Entered By: Blanche East on 02/14/2023 09:31:36 -------------------------------------------------------------------------------- Wound Assessment Details Patient Name: Date of Service: [place], [name] NA LD R. 02/14/2023 9:15 A M Medical Record Number: [id number] Patient Account Number: [id number] Date of Birth/Sex: Treating RN: Nov 27, 1956 (67 y.o. [name] Primary Care Cordera Stineman: [name] Other Clinician: Referring Darlisa Spruiell: Treating Edge Mauger/Extender: Doristine Bosworth in Treatment: 22 Wound Status Wound Number: 2 Primary Diabetic Wound/Ulcer of the Lower Extremity Etiology: Wound Location: Left, Dorsal Foot Wound Open Wounding Event: Pressure Injury Status: Date Acquired: 07/09/2022 Comorbid Cataracts, Sleep Apnea,  Hypertension, Type II Diabetes, Weeks Of Treatment: 22 History: Osteomyelitis, Neuropathy Clustered Wound: No Wound Measurements Length: (cm) 3.4 Width: (cm) 3 Depth: (cm) 0.1 Area: (cm) 8.011 Volume: (cm) 0.801 % Reduction in Area: 15% % Reduction in Volume: 15% Epithelialization: Small (1-33%) Tunneling: No Undermining: No Wound Description Classification: Grade 2 Wound Margin: Flat and Intact Exudate Amount: Medium Exudate Type: Serosanguineous Exudate Color: red, brown Foul Odor After Cleansing: No Slough/Fibrino Yes Wound Bed Granulation Amount: Medium (34-66%) Exposed SALVOTORE, PEYSER R ([id number]) [id number] Page 9 of 11 Granulation Quality: Red, Pink Fascia Exposed: No Necrotic Amount: Medium (34-66%) Fat Layer (Subcutaneous Tissue) Exposed: Yes Necrotic Quality: Adherent Slough Tendon Exposed: No Muscle Exposed: No Joint Exposed: No Bone Exposed: No Periwound Skin Texture Texture Color No Abnormalities Noted: No No Abnormalities Noted: Yes Scarring: Yes Temperature / Pain Temperature: No Abnormality Moisture No Abnormalities Noted: No Dry / Scaly: No Maceration: Yes Electronic Signature(s) Signed: 02/14/2023 4:55:03 PM By: Blanche East RN Entered By: Blanche East on 02/14/2023 09:31:48 -------------------------------------------------------------------------------- Wound Assessment Details Patient Name: Date of Service: [place], [name] NA LD R. 02/14/2023 9:15 A M Medical Record Number: [id number] Patient Account Number: [id number] Date of Birth/Sex: Treating RN: 1956-04-15 (67 y.o. [name] Primary Care Macauley Mossberg: [name] Other Clinician: Referring Zonya Gudger: Treating Davante Gerke/Extender: Doristine Bosworth in Treatment: 22 Wound Status Wound Number: 2 Primary Diabetic Wound/Ulcer of the Lower Extremity Etiology: Wound Location: Left, Dorsal Foot Wound Open Wounding Event: Pressure  Injury Status: Date Acquired: 07/09/2022 Comorbid Cataracts, Sleep Apnea, Hypertension, Type II Diabetes, Weeks Of Treatment: 22 History: Osteomyelitis, Neuropathy Clustered Wound: No Photos Wound Measurements Length: (cm) 3.4 Width: (cm) 3 Depth: (cm) 0.1 Area: (cm) 8.011 Volume: (cm) 0.801 % Reduction in Area: 15% % Reduction in Volume: 15% Epithelialization: Small (1-33%) Tunneling: No Undermining: No Wound Description Classification: Grade 2 Wound Margin: Flat and Intact Exudate Amount: Medium Exudate Type: Serosanguineous Exudate Color: red, brown Foul Odor After Cleansing: No Slough/Fibrino Yes Wound Bed Granulation Amount: Medium (34-66%) Exposed Structure Granulation Quality: Red, Pink Fascia Exposed: No Necrotic Amount: Medium (34-66%) Fat Layer (Subcutaneous Tissue) ExposedDADDY, DISOTELL ([id number]) [id number] Page 10 of 11 Necrotic Quality: Adherent Slough Tendon Exposed: No Muscle Exposed: No Joint Exposed: No Bone Exposed: No Periwound Skin Texture Texture Color No Abnormalities Noted: No No Abnormalities Noted: Yes Scarring: Yes Temperature / Pain Temperature: No Abnormality Moisture No Abnormalities Noted: No Dry / Scaly: No Maceration: Yes Treatment Notes Wound #2 (Foot) Wound Laterality: Dorsal, Left Cleanser Normal Saline Discharge Instruction: Cleanse the wound with Normal Saline prior to applying a clean dressing using gauze sponges, not tissue or cotton balls. Soap and Water Discharge Instruction: May shower and wash wound with dial antibacterial soap and water prior to dressing change. Peri-Wound Care keystone Topical Primary Dressing Promogran Prisma Matrix, 4.34 (sq in) (silver collagen) Discharge Instruction: Moisten collagen with hydrogel  Secondary Dressing Zetuvit Plus Silicone Border Dressing 4x4 (in/in) Discharge Instruction: Apply silicone border over primary dressing as directed. Secured  With [employer] 4x5 (in/yd) Discharge Instruction: Secure with Coban as directed. Kerlix Roll Sterile, 4.5x3.1 (in/yd) Discharge Instruction: Secure with Kerlix as directed. Compression Wrap Compression Stockings Add-Ons Electronic Signature(s) Signed: 02/14/2023 4:55:03 PM By: Blanche East RN Entered By: Blanche East on 02/14/2023 09:32:09 -------------------------------------------------------------------------------- Vitals Details Patient Name: Date of Service: [name], [name] NA LD R. 02/14/2023 9:15 A M Medical Record Number: [id number] Patient Account Number: [id number] Date of Birth/Sex: Treating RN: Sep 24, 1956 (67 y.o. [name] Primary Care Kenley Rettinger: [name] Other Clinician: Referring Malayiah Mcbrayer: Treating Bless Belshe/Extender: Doristine Bosworth in Treatment: 22 Vital Signs Time Taken: 09:15 Temperature (F): 98.4 Height (in): 74 Pulse (bpm): 75 Weight (lbs): 245 Respiratory Rate (breaths/min): 16 Body Mass Index (BMI): 31.5 Blood Pressure (mmHg): 155/77 Capillary Blood Glucose (mg/dl): 194 Reference Range: 80 - 120 mg / dl ANIAS, RUMMER ([id number]) [id number] Page 11 of 11 Electronic Signature(s) Signed: 02/14/2023 4:55:03 PM By: Blanche East RN Entered By: Blanche East on 02/14/2023 09:17:19

## 2023-02-15 NOTE — Progress Notes (Signed)
[name], [name] ([id number]) [id number] Page 1 of 13 Visit Report for 02/14/2023 Chief Complaint Document Details Patient Name: Date of Service: [name], [name] 02/14/2023 9:15 A M Medical Record Number: [id number] Patient Account Number: [id number] Date of Birth/Sex: Treating RN: 07-Jun-1956 (67 y.o. M) Primary Care Provider: Nelda Bucks Other Clinician: Referring Provider: Treating Provider/Extender: Doristine Bosworth in Treatment: 22 Information Obtained from: Patient Chief Complaint Patients presents for treatment of an open diabetic ulcer and evaluation for hyperbaric oxygen therapy Electronic Signature(s) Signed: 02/14/2023 10:56:22 AM By: Fredirick Maudlin MD FACS Entered By: Fredirick Maudlin on 02/14/2023 10:56:21 -------------------------------------------------------------------------------- Debridement Details Patient Name: Date of Service: [name], [name] NA LD R. 02/14/2023 9:15 A M Medical Record Number: [id number] Patient Account Number: [id number] Date of Birth/Sex: Treating RN: 08/01/1956 (67 y.o. [name] Primary Care Provider: Nelda Bucks Other Clinician: Referring Provider: Treating Provider/Extender: Doristine Bosworth in Treatment: 22 Debridement Performed for Assessment: Wound #1 Left Calcaneus Performed By: Physician Fredirick Maudlin, MD Debridement Type: Debridement Severity of Tissue Pre Debridement: Fat layer exposed Level of Consciousness (Pre-procedure): Awake and Alert Pre-procedure Verification/Time Out Yes - 09:41 Taken: Start Time: 09:42 T Area Debrided (L x W): otal 7 (cm) x 4.8 (cm) = 33.6 (cm) Tissue and other material debrided: Non-Viable, Slough, Skin: Epidermis, Slough Level: Skin/Epidermis Debridement Description: Selective/Open Wound Instrument: Curette Bleeding: Minimum Hemostasis Achieved: Pressure Response to Treatment: Procedure was tolerated  well Level of Consciousness (Post- Awake and Alert procedure): Post Debridement Measurements of Total Wound Length: (cm) 7 Width: (cm) 4.8 Depth: (cm) 0.3 Volume: (cm) 7.917 Character of Wound/Ulcer Post Debridement: Requires Further Debridement Severity of Tissue Post Debridement: Fat layer exposed Post Procedure Diagnosis Same as Pre-procedure Notes Scribed for Dr. Celine Ahr by Blanche East, RN Electronic Signature(s) Signed: 02/14/2023 12:33:57 PM By: Fredirick Maudlin MD FACS Signed: 02/14/2023 4:55:03 PM By: Blanche East RN Entered By: Blanche East on 02/14/2023 09:46:15 [name] ([id number]) [id number] Page 2 of 13 -------------------------------------------------------------------------------- Debridement Details Patient Name: Date of Service: [name], [name] 02/14/2023 9:15 A M Medical Record Number: [id number] Patient Account Number: [id number] Date of Birth/Sex: Treating RN: 03/05/1956 (67 y.o. [name] Primary Care Provider: Nelda Bucks Other Clinician: Referring Provider: Treating Provider/Extender: Doristine Bosworth in Treatment: 22 Debridement Performed for Assessment: Wound #2 Left,Dorsal Foot Performed By: Physician Fredirick Maudlin, MD Debridement Type: Debridement Severity of Tissue Pre Debridement: Fat layer exposed Level of Consciousness (Pre-procedure): Awake and Alert Pre-procedure Verification/Time Out Yes - 09:41 Taken: Start Time: 09:42 T Area Debrided (L x W): otal 3.4 (cm) x 3 (cm) = 10.2 (cm) Tissue and other material debrided: Non-Viable, Slough, Slough Level: Non-Viable Tissue Debridement Description: Selective/Open Wound Instrument: Curette Bleeding: Minimum Hemostasis Achieved: Pressure Response to Treatment: Procedure was tolerated well Level of Consciousness (Post- Awake and Alert procedure): Post Debridement Measurements of Total Wound Length: (cm) 3.4 Width: (cm)  3 Depth: (cm) 0.1 Volume: (cm) 0.801 Character of Wound/Ulcer Post Debridement: Requires Further Debridement Severity of Tissue Post Debridement: Fat layer exposed Post Procedure Diagnosis Same as Pre-procedure Notes Scribed for Dr. Celine Ahr by Blanche East, RN Electronic Signature(s) Signed: 02/14/2023 12:33:57 PM By: Fredirick Maudlin MD FACS Signed: 02/14/2023 4:55:03 PM By: Blanche East RN Entered By: Blanche East on 02/14/2023 09:47:01 -------------------------------------------------------------------------------- HPI Details Patient Name: Date of Service: [name], [name] NA LD R. 02/14/2023 9:15 A M Medical Record Number: [id number] Patient Account Number: [id number] Date of Birth/Sex: Treating RN: Sep 08, 1956 (67 y.o. M)  Primary Care Provider: Nelda Bucks Other Clinician: Referring Provider: Treating Provider/Extender: Doristine Bosworth in Treatment: 22 History of Present Illness HPI Description: ADMISSION 09/10/2022 This is a 67 year old poorly controlled type II diabetic (last hemoglobin A1c 10.8%) who has had an ulcer on his heel for over 3 years. He has been seen in multiple wound care centers, including [facility] and [facility]. He reports that at least 3 doctors have recommended that he undergo below-knee amputation. He most recently met with Dr. Catalina Gravel, a vascular surgeon affiliated with [facility]. 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 Unfortunately, the patient has some extenuating social circumstances including the fact that he cares for his wife who has stage IV colon cancer and still works, driving vehicles for [employer]. 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 He was recently hospitalized at [facility] for sepsis and currently has a PICC line through which he receives IV antibiotics. He reports having had another MRI  during that hospital stay along with a chest x-ray and EKG. He apparently contacted one of the hyperbaric therapy techs here and asked a number of questions about hyperbaric oxygen treatments. He subsequently self-referred to our center to undergo further evaluation and management. I mention to him that [facility] actually has hyperbaric chambers, but he states that he lives in [place] and this would be more convenient for him given the intensive nature of the therapy and time requirement. ABI in clinic today was 0.94. HAMZE, AUBREY ([id number]) [id number] Page 3 of 13 The patient actually has 2 wounds. There is a wound on the dorsum of his left foot with heavy black eschar and slough present. After debridement, this was demonstrated to involve the muscle and the extensor tendons are exposed. On his heel, there is essentially a "shark bite" type wound, with much of the heel fat pad absent. The muscle layer is exposed. There is blue-green staining around the perimeter of the wound, but no significant odor. He says he has been applying collagen to the wound on his heel and Silvadene and Betadine to the wound on his dorsal foot. 09/20/2022: The heel wound is quite macerated with wet periwound callus. There is slough accumulation on the surface. The dorsal foot wound looks better this week. There is still exposed tendon, but it is fairly clean with just a little biofilm buildup. We are still working on gathering the required documentation to submit for pretreatment review for hyperbaric oxygen therapy. 09/29/2022: The dorsal foot wound continues to improve. I do not see any exposed tendon at this point. There is just some slough accumulation on the wound surface. He continues to have very wet macerated periwound callus on his heel. There is slough on the surface, but it is loose and thin. There is an area of undermining at the 11 o'clock position, but the overlying skin and  subcutaneous tissue is healthy and viable. He has been approved for hyperbaric oxygen therapy and will start treatment tomorrow. 10/06/2022: Continued contraction and improvement of the dorsal foot wound. There is just a little bit of slough accumulation on the wound surface. The periwound callus continues to accumulate on the heel wound and it is quite macerated. It is also persistently found with blue-green staining present. He did initiate his hyperbaric oxygen therapy, but has had significant difficulty with decompression. He will be going to ENT to have PE tubes  placed. 10/18/2022: His hyperbaric oxygen therapy is on hold while his otological issues are being addressed. He came in again today with the periwound skin on both the dorsal aspect of his foot and his calcaneus completely macerated. The blue-green discoloration, however, has abated and the undermining on the calcaneus has improved. The dorsal foot wound has also contracted somewhat. 10/26/2022: The dorsal foot wound continues to contract and fill with good granulation tissue. The heel, once again, has a rim of macerated callus, but the undermining and tunneling continues to contract. He has completed his oral antibiotics and has been using the [facility] topical compounded antibiotic for his dressing changes at home. He is scheduled to see ENT this afternoon. 11/08/2022: The dorsal foot wound is flush with the surrounding skin and has a good granulation tissue surface. There is some slough accumulation. As usual, the heel has a rim of macerated callus but the dimensions are smaller and the tunneling and undermining have contracted further. He has resumed his hyperbaric oxygen therapy. 12/12; patient seen for wound evaluation. He is tolerating HBO although we could not dive him yesterday because of relative hypoglycemia and the fact he had given himself NovoLog insulin before he came to clinic. Today his blood sugar is in the 180 range she  should be fine. He has a large wound on the plantar calcaneus on the right and a more superficial area on the dorsal foot. He is using a scooter for offloading 11/30/2022: The dorsal foot wound continues to contract. There is some slough on the wound surface. The large calcaneus wound has heaped up wet callus around the margins. Continued undermining. 12/08/2022: The dorsal foot wound is flat and flush with the surrounding skin surface. There is some slough present. Once again, the calcaneal wound has thick, absolutely macerated callus hanging off in tatters around the edges. The patient cannot explain to me why this part of his wound gets so wet. There is slough on the surface. The undermined portion of the wound has filled in. 12/17/2022: Earlier this week, he presented for his hyperbaric oxygen therapy and was hypotensive and ill-appearing. He also had what appeared to be an infected sebaceous cyst on his right upper arm. He was sent to the emergency department. He was given a fluid bolus and the ED provider lanced the cyst. He was prescribed doxycycline. He seems to be feeling better today. He notes that he has had copious drainage from his foot. On further questioning, he states that he has not been taking his oral diuretic. The dorsal foot wound has expanded somewhat, but is actually more superficial. The plantar heel wound is about the same. 12/27/2022: The dorsal foot wound has epithelialized further. The plantar foot wound is about the same size but has heaped up macerated callus around the perimeter. The intake nurse noted a tiny fragment of bone on his dressings and there is now an area at about the 5 o'clock position where 1 can palpate bone through a small slit in the soft tissues. The wound on his arm is contracting but he still likely has cyst wall present due to the manner in which the infected sebaceous cyst was addressed in the ER. 01/01/2023: The wound on his shoulder has contracted  considerably. The dorsal foot wound has some slough on the surface but also looks to be improving. The plantar foot wound is basically unchanged. 01/10/2023: The wound on his shoulder continues to diminish in size and depth. The dorsal foot wound is improving  with just a light layer of slough on the surface. The plantar foot wound appears to be contracting but still has thick wet callus around the perimeter. We did in chamber T cPO2 monitoring during his last hyperbaric oxygen therapy. The results show that he has extremely poor tissue perfusion at room air and 1 atm of pressure. He does respond extremely well to hyperbaric oxygen therapy, however. 01/17/2023: The wound on his shoulder is down to 0.6 cm in depth, down from 1 cm last week. The dorsal foot wound is quite a bit smaller this week with just some light slough on the surface. The plantar foot wound is also smaller and the area of bone that was exposed at the posterior heel is now covered. There is still substantial wet callus around the perimeter. He is scheduled to undergo formal vascular studies sometime next week. 01/24/2023: The wound on his shoulder has almost completely filled in, with just a little bit of depth remaining. The dorsal foot wound got macerated; it appears that his home health nurses are applying the drape for his heel wound over the top of this, meaning that the dorsal foot wound is not getting changed as regularly as it is supposed to. There is some slough on the surface. The plantar foot wound has thick wet callus around the perimeter. The dimensions measure a little bit smaller. He is having his vascular studies done this afternoon. 02/07/2023: The wound on his shoulder is healed. The dorsal foot wound continues to be subjected to excessive moisture due to the home health nurses applying the drape from his wound VAC over the top of it. It does measure smaller, however. There is more bone exposed on his calcaneus,  unfortunately. He has heaped up wet callus around the edges of the heel wound again. He is going to undergo angiography with Dr. Stanford Breed on Friday. Hopefully there will be intervention available to him that may aid in his wound healing. 02/14/2023: He did not get his angiogram last week with Dr. Stanford Breed because he did not have a ride home. The dorsal foot wound looks a little bit better with just a light layer of slough. Unfortunately, there is more bone exposed at the calcaneus and I am concerned that we are nearing a point at which his leg will not be salvageable. Electronic Signature(s) Signed: 02/14/2023 10:57:24 AM By: Fredirick Maudlin MD FACS Entered By: Fredirick Maudlin on 02/14/2023 10:57:24 -------------------------------------------------------------------------------- Physical Exam Details Patient Name: Date of Service: [place], [name] NA LD R. 02/14/2023 9:15 A Koren Bound, Charlena Cross ([id number]) [id number] Page 4 of 13 Medical Record Number: [id number] Patient Account Number: [id number] Date of Birth/Sex: Treating RN: 11-09-1956 (67 y.o. M) Primary Care Provider: Nelda Bucks Other Clinician: Referring Provider: Treating Provider/Extender: Doristine Bosworth in Treatment: 22 Constitutional Hypertensive, asymptomatic. . . . no acute distress. Respiratory Normal work of breathing on room air. Notes 02/14/2023: The dorsal foot wound looks a little bit better with just a light layer of slough. Unfortunately, there is more bone exposed at the calcaneus and I am concerned that we are nearing a point at which his leg will not be salvageable. Electronic Signature(s) Signed: 02/14/2023 10:59:12 AM By: Fredirick Maudlin MD FACS Entered By: Fredirick Maudlin on 02/14/2023 10:59:11 -------------------------------------------------------------------------------- Physician Orders Details Patient Name: Date of Service: [place], [name] 02/14/2023 9:15 A  M Medical Record Number: [id number] Patient Account Number: [id number] Date of Birth/Sex: Treating RN: [birth year] (67 y.o. 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 - Dr. 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 Protect dressing(s) with water repellant cover (for example, large plastic bag) or a cast cover and may then take shower. Negative Presssure Wound Therapy Wound Vac to wound continuously at 152m/hg pressure - to left calcaneus Black Foam Edema Control - Lymphedema / SCD / Other Left Lower Extremity Avoid standing for long periods of time. Off-Loading Wound #1 Left Calcaneus Other: - use knee scooter to ambulate, minimal weight bearing right foot Home Health Dressing changes to be completed [facility] / Wednesday / Friday [facility] Please do not cover Dorsal wound on Lt ft with wound vac drape. This dressing needs to be changed daily due to the increased drainage. The peri-wound continues to be macerated. Other Home Health Orders/Instructions: - [facility] Hyperbaric Oxygen Therapy Wound #1 Left Calcaneus Evaluate for HBO  Therapy - Continue HBOT for additional 20 treatments for total of 60 treatments SDEVEON, EZEKIEL([id number] Page 5 of 13 Indication: - chronic refractory osteomyelitis If appropriate for treatment, begin HBOT per protocol: 2.5 ATA for 90 Minutes with 2 Five (5) Minute A Breaks ir Total Number of Treatments: - 60 One treatments per day (delivered Monday through Friday unless otherwise specified in Special Instructions below): Finger stick Blood Glucose Pre- and Post- HBOT Treatment. Follow Hyperbaric Oxygen Glycemia Protocol A frin (Oxymetazoline HCL) 0.05% nasal spray - 1 spray in both nostrils daily as needed prior to HBO treatment for difficulty clearing ears Wound Treatment Wound #1 - Calcaneus Wound Laterality: Left Cleanser: Normal Saline 3 x Per Week/30 Days Discharge Instructions: Cleanse the wound with Normal Saline prior to applying a clean dressing using gauze sponges, not tissue or cotton balls. Cleanser: Soap and Water 3 x Per Week/30 Days Discharge Instructions: May shower and wash wound with dial antibacterial soap and water prior to dressing change. Peri-Wound Care: keystone 3 x Per Week/30 Days Prim Dressing: VAC ary 3 x Per Week/30 Days Secured With: Coban Self-Adherent Wrap 4x5 (in/yd) 3 x Per Week/30 Days Discharge Instructions: Secure with Coban as directed. Secured With: [employer] 4.5x3.1 (in/yd) 3 x Per Week/30 Days Discharge Instructions: Secure with Kerlix as directed. Wound #2 - Foot Wound Laterality: Dorsal, Left Cleanser: Normal Saline Discharge Instructions: Cleanse the wound with Normal Saline prior to applying a clean dressing using gauze sponges, not tissue or cotton balls. Cleanser: Soap and Water Discharge Instructions: May shower and wash wound with dial antibacterial soap and water prior to dressing change. Peri-Wound Care: keystone Prim Dressing: Promogran Prisma Matrix, 4.34 (sq in) (silver  collagen) ary Discharge Instructions: Moisten collagen with hydrogel Secondary Dressing: Zetuvit Plus Silicone Border Dressing 4x4 (in/in) Discharge Instructions: Apply silicone border over primary dressing as directed. Secured With: Coban Self-Adherent Wrap 4x5 (in/yd) Discharge  Instructions: Secure with Coban as directed. Secured With: [employer], 4.5x3.1 (in/yd) Discharge Instructions: Secure with Kerlix as directed. GLYCEMIA INTERVENTIONS PROTOCOL PRE-HBO GLYCEMIA INTERVENTIONS ACTION INTERVENTION Obtain pre-HBO capillary blood glucose (ensure 1 physician order is in chart). A. Notify HBO physician and await physician orders. 2 If result is 70 mg/dl or below: B. If the result meets the hospital definition of a critical result, follow hospital policy. A. Give patient an 8 ounce Glucerna Shake, an 8 ounce Ensure, or 8 ounces of a Glucerna/Ensure equivalent dietary supplement*. B. Wait 30 minutes. If result is 71 mg/dl to 130 mg/dl: C. Retest patients capillary blood glucose (CBG). D. If result greater than or equal to 110 mg/dl, proceed with HBO. If result less than 110 mg/dl, notify HBO physician and consider holding HBO. If result is 131 mg/dl to 249 mg/dl: A. Proceed with HBO. A. Notify HBO physician and await physician orders. B. It is recommended to hold HBO and do If result is 250 mg/dl or greater: blood/urine ketone testing. C. If the result meets the hospital definition of a critical result, follow hospital policy. POST-HBO GLYCEMIA INTERVENTIONS ACTION INTERVENTION Obtain post HBO capillary blood glucose (ensure 1 physician order is in chart). FLEM, EBRAHIM ([id number]) [id number] Page 6 of 13 A. Notify HBO physician and await physician orders. 2 If result is 70 mg/dl or below: B. If the result meets the hospital definition of a critical result, follow hospital policy. A. Give patient an 8 ounce Glucerna Shake,  an 8 ounce Ensure, or 8 ounces of a Glucerna/Ensure equivalent dietary supplement*. B. Wait 15 minutes for symptoms of If result is 71 mg/dl to 100 mg/dl: hypoglycemia (i.e. nervousness, anxiety, sweating, chills, clamminess, irritability, confusion, tachycardia or dizziness). C. If patient asymptomatic, discharge patient. If patient symptomatic, repeat capillary blood glucose (CBG) and notify HBO physician. If result is 101 mg/dl to 249 mg/dl: A. Discharge patient. A. Notify HBO physician and await physician orders. B. It is recommended to do blood/urine ketone If result is 250 mg/dl or greater: testing. C. If the result meets the hospital definition of a critical result, follow hospital policy. *Juice or candies are NOT equivalent products. If patient refuses the Glucerna or Ensure, please consult the hospital dietitian for an appropriate substitute. Electronic Signature(s) Signed: 02/14/2023 12:33:57 PM By: Fredirick Maudlin MD FACS Previous Signature: 02/14/2023 9:35:53 AM Version By: Blanche East RN Entered By: Fredirick Maudlin on 02/14/2023 10:59:29 -------------------------------------------------------------------------------- Problem List Details Patient Name: Date of Service: [place], [name] 02/14/2023 9:15 A M Medical Record Number: [id number] Patient Account Number: [id number] Date of Birth/Sex: Treating RN: 07-21-1956 (67 y.o. 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 09/10/2022 No Yes L97.523 Non-pressure chronic ulcer of other part of left foot with necrosis of muscle 09/10/2022 No Yes [id number] Other chronic osteomyelitis, left ankle and foot 09/10/2022 No Yes E11.65 Type 2 diabetes  mellitus with hyperglycemia 09/10/2022 No Yes E11.621 Type 2 diabetes mellitus with foot ulcer 09/10/2022 No Yes Inactive Problems ICD-10 Code Description Active Date Inactive Date L72.3 Sebaceous cyst 12/17/2022 12/17/2022 Resolved Problems IRAN, WALKENHORST ([id number]) [id number] Page 7 of 13 ICD-10 Code Description Active Date Resolved Date L98.492 Non-pressure chronic ulcer of skin of other sites with fat layer exposed 12/17/2022 01/03/2023 Electronic Signature(s) Signed: 02/14/2023 10:56:00 AM  By: Fredirick Maudlin MD FACS Entered By: Fredirick Maudlin on 02/14/2023 10:56:00 -------------------------------------------------------------------------------- Progress Note Details Patient Name: Date of Service: [place], [name] 02/14/2023 9:15 A M Medical Record Number: [id number] Patient Account Number: [id number] Date of Birth/Sex: Treating RN: [birth year] (67 y.o. M) Primary Care Provider: Nelda Bucks Other Clinician: Referring Provider: Treating Provider/Extender: Doristine Bosworth in Treatment: 22 Subjective Chief Complaint Information obtained from Patient Patients presents for treatment of an open diabetic ulcer and evaluation for hyperbaric oxygen therapy History of Present Illness (HPI) ADMISSION 09/10/2022 This is a 67 year old poorly controlled type II diabetic (last hemoglobin A1c 10.8%) who has had an ulcer on his heel for over 3 years. He has been seen in multiple wound care centers, including [facility] and [facility]. He reports that at least 3 doctors have recommended that he undergo below-knee amputation. He most recently met with Dr. Catalina Gravel, a vascular surgeon affiliated with [facility]. 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 Unfortunately, the patient has some extenuating social circumstances including the fact that he cares for  his wife who has stage IV colon cancer and still works, driving vehicles for [employer]. 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 He was recently hospitalized at [name] for sepsis and currently has a PICC line through which he receives IV antibiotics. He reports having had another MRI during that hospital stay along with a chest x-ray and EKG. He apparently contacted one of the hyperbaric therapy techs here and asked a number of questions about hyperbaric oxygen treatments. He subsequently self-referred to our center to undergo further evaluation and management. I mention to him that [facility] actually has hyperbaric chambers, but he states that he lives in [place] and this would be more convenient for him given the intensive nature of the therapy and time requirement. ABI in clinic today was 0.94. The patient actually has 2 wounds. There is a wound on the dorsum of his left foot with heavy black eschar and slough present. After debridement, this was demonstrated to involve the muscle and the extensor tendons are exposed. On his heel, there is essentially a "shark bite" type wound, with much of the heel fat pad absent. The muscle layer is exposed. There is blue-green staining around the perimeter of the wound, but no significant odor. He says he has been applying collagen to the wound on his heel and Silvadene and Betadine to the wound on his dorsal foot. 09/20/2022: The heel wound is quite macerated with wet periwound callus. There is slough accumulation on the surface. The dorsal foot wound looks better this week. There is still exposed tendon, but it is fairly clean with just a little biofilm buildup. We are still working on gathering the required documentation to submit for pretreatment review for hyperbaric oxygen therapy. 09/29/2022: The dorsal foot wound continues to improve. I do not see any exposed tendon at this point. There is just some slough  accumulation on the wound surface. He continues to have very wet macerated periwound callus on his heel. There is slough on the surface, but it is loose and thin. There is an area of undermining at the 11 o'clock position, but the overlying skin and subcutaneous tissue is healthy and viable. He has been approved for hyperbaric oxygen therapy and will start treatment tomorrow. 10/06/2022: Continued contraction and improvement of the dorsal foot wound. There is  just a little bit of slough accumulation on the wound surface. The periwound callus continues to accumulate on the heel wound and it is quite macerated. It is also persistently found with blue-green staining present. He did initiate his hyperbaric oxygen therapy, but has had significant difficulty with decompression. He will be going to ENT to have PE tubes placed. 10/18/2022: His hyperbaric oxygen therapy is on hold while his otological issues are being addressed. He came in again today with the periwound skin on both the dorsal aspect of his foot and his calcaneus completely macerated. The blue-green discoloration, however, has abated and the undermining on the calcaneus has improved. The dorsal foot wound has also contracted somewhat. 10/26/2022: The dorsal foot wound continues to contract and fill with good granulation tissue. The heel, once again, has a rim of macerated callus, but the undermining and tunneling continues to contract. He has completed his oral antibiotics and has been using the [facility] topical compounded antibiotic for his dressing changes at home. He is scheduled to see ENT this afternoon. 11/08/2022: The dorsal foot wound is flush with the surrounding skin and has a good granulation tissue surface. There is some slough accumulation. As usual, the heel has a rim of macerated callus but the dimensions are smaller and the tunneling and undermining have contracted further. He has resumed his hyperbaric oxygen therapy. 12/12;  patient seen for wound evaluation. He is tolerating HBO although we could not dive him yesterday because of relative hypoglycemia and the fact he had given himself NovoLog insulin before he came to clinic. Today his blood sugar is in the 180 range she should be fine. He has a large wound on the plantar calcaneus on the right and a more superficial area on the dorsal foot. He is using a scooter for offloading 11/30/2022: The dorsal foot wound continues to contract. There is some slough on the wound surface. The large calcaneus wound has heaped up wet callus around the margins. Continued undermining. 12/08/2022: The dorsal foot wound is flat and flush with the surrounding skin surface. There is some slough present. Once again, the calcaneal wound has thick, absolutely macerated callus hanging off in tatters around the edges. The patient cannot explain to me why this part of his wound gets so wet. There is slough on the surface. The undermined portion of the wound has filled in. BETHEL, DEERY ([id number]) [id number] Page 8 of 13 12/17/2022: Earlier this week, he presented for his hyperbaric oxygen therapy and was hypotensive and ill-appearing. He also had what appeared to be an infected sebaceous cyst on his right upper arm. He was sent to the emergency department. He was given a fluid bolus and the ED provider lanced the cyst. He was prescribed doxycycline. He seems to be feeling better today. He notes that he has had copious drainage from his foot. On further questioning, he states that he has not been taking his oral diuretic. The dorsal foot wound has expanded somewhat, but is actually more superficial. The plantar heel wound is about the same. 12/27/2022: The dorsal foot wound has epithelialized further. The plantar foot wound is about the same size but has heaped up macerated callus around the perimeter. The intake nurse noted a tiny fragment of bone on his dressings and  there is now an area at about the 5 o'clock position where 1 can palpate bone through a small slit in the soft tissues. The wound on his arm is contracting but he still likely has cyst  wall present due to the manner in which the infected sebaceous cyst was addressed in the ER. 01/01/2023: The wound on his shoulder has contracted considerably. The dorsal foot wound has some slough on the surface but also looks to be improving. The plantar foot wound is basically unchanged. 01/10/2023: The wound on his shoulder continues to diminish in size and depth. The dorsal foot wound is improving with just a light layer of slough on the surface. The plantar foot wound appears to be contracting but still has thick wet callus around the perimeter. We did in chamber T cPO2 monitoring during his last hyperbaric oxygen therapy. The results show that he has extremely poor tissue perfusion at room air and 1 atm of pressure. He does respond extremely well to hyperbaric oxygen therapy, however. 01/17/2023: The wound on his shoulder is down to 0.6 cm in depth, down from 1 cm last week. The dorsal foot wound is quite a bit smaller this week with just some light slough on the surface. The plantar foot wound is also smaller and the area of bone that was exposed at the posterior heel is now covered. There is still substantial wet callus around the perimeter. He is scheduled to undergo formal vascular studies sometime next week. 01/24/2023: The wound on his shoulder has almost completely filled in, with just a little bit of depth remaining. The dorsal foot wound got macerated; it appears that his home health nurses are applying the drape for his heel wound over the top of this, meaning that the dorsal foot wound is not getting changed as regularly as it is supposed to. There is some slough on the surface. The plantar foot wound has thick wet callus around the perimeter. The dimensions measure a little bit smaller. He is having his  vascular studies done this afternoon. 02/07/2023: The wound on his shoulder is healed. The dorsal foot wound continues to be subjected to excessive moisture due to the home health nurses applying the drape from his wound VAC over the top of it. It does measure smaller, however. There is more bone exposed on his calcaneus, unfortunately. He has heaped up wet callus around the edges of the heel wound again. He is going to undergo angiography with Dr. Stanford Breed on Friday. Hopefully there will be intervention available to him that may aid in his wound healing. 02/14/2023: He did not get his angiogram last week with Dr. Stanford Breed because he did not have a ride home. The dorsal foot wound looks a little bit better with just a light layer of slough. Unfortunately, there is more bone exposed at the calcaneus and I am concerned that we are nearing a point at which his leg will not be salvageable. Patient History Family History Cancer - Father,Mother, Diabetes - Mother,Father, Lung Disease - Father, Thyroid Problems - Mother, No family history of Heart Disease, Hereditary Spherocytosis, Hypertension, Kidney Disease, Seizures, Stroke, Tuberculosis. Social History Never smoker, Marital Status - Married, Alcohol Use - Never, Drug Use - No History, Caffeine Use - Daily. Medical History Eyes Patient has history of Cataracts Denies history of Glaucoma, Optic Neuritis Ear/Nose/Mouth/Throat Denies history of Chronic sinus problems/congestion, Middle ear problems Respiratory Patient has history of Sleep Apnea Cardiovascular Patient has history of Hypertension Gastrointestinal Denies history of Cirrhosis , Colitis, Crohnoos, Hepatitis A, Hepatitis B, Hepatitis C Endocrine Patient has history of Type II Diabetes Immunological Denies history of Lupus Erythematosus, Raynaudoos, Scleroderma Musculoskeletal Patient has history of Osteomyelitis - 2023 Neurologic Patient has history of  Neuropathy - Bila lower  extremities Oncologic Denies history of Received Chemotherapy, Received Radiation Psychiatric Denies history of Anorexia/bulimia, Confinement Anxiety Hospitalization/Surgery History - I and D Left calcaneus. - back surgery- laminectomy. - eye surgery- Bila cataracts. - shoulder arthroscopy. Medical A Surgical History Notes nd Cardiovascular hyperlipidemia Genitourinary AKI Objective JALEEL, COBAUGH ([id number]) [id number] Page 9 of 13 Constitutional Hypertensive, asymptomatic. no acute distress. Vitals Time Taken: 9:15 AM, Height: 74 in, Weight: 245 lbs, BMI: 31.5, Temperature: 98.4 F, Pulse: 75 bpm, Respiratory Rate: 16 breaths/min, Blood Pressure: 155/77 mmHg, Capillary Blood Glucose: 194 mg/dl. Respiratory Normal work of breathing on room air. General Notes: 02/14/2023: The dorsal foot wound looks a little bit better with just a light layer of slough. Unfortunately, there is more bone exposed at the calcaneus and I am concerned that we are nearing a point at which his leg will not be salvageable. Integumentary (Hair, Skin) Wound #1 status is Open. Original cause of wound was Pressure Injury. The date acquired was: 03/09/2019. The wound has been in treatment 22 weeks. The wound is located on the Left Calcaneus. The wound measures 7cm length x 4.8cm width x 0.3cm depth; 26.389cm^2 area and 7.917cm^3 volume. There is bone and Fat Layer (Subcutaneous Tissue) exposed. Tunneling has been noted at 1:00 with a maximum distance of 2cm. There is additional tunneling and at 11:00 with a maximum distance of 1.5cm. Undermining begins at 1:00 and ends at 11:00 with a maximum distance of 1cm. There is a large amount of serosanguineous drainage noted. The wound margin is thickened. There is medium (34-66%) red, pale granulation within the wound bed. There is a medium (34- 66%) amount of necrotic tissue within the wound bed including Eschar and Adherent Slough. The periwound  skin appearance had no abnormalities noted for color. The periwound skin appearance exhibited: Callus, Maceration. The periwound skin appearance did not exhibit: Dry/Scaly. Periwound temperature was noted as No Abnormality. Wound #2 status is Open. Original cause of wound was Pressure Injury. The date acquired was: 07/09/2022. The wound has been in treatment 22 weeks. The wound is located on the Left,Dorsal Foot. The wound measures 3.4cm length x 3cm width x 0.1cm depth; 8.011cm^2 area and 0.801cm^3 volume. There is Fat Layer (Subcutaneous Tissue) exposed. There is no tunneling or undermining noted. There is a medium amount of serosanguineous drainage noted. The wound margin is flat and intact. There is medium (34-66%) red, pink granulation within the wound bed. There is a medium (34-66%) amount of necrotic tissue within the wound bed including Adherent Slough. The periwound skin appearance had no abnormalities noted for color. The periwound skin appearance exhibited: Scarring, Maceration. The periwound skin appearance did not exhibit: Dry/Scaly. Periwound temperature was noted as No Abnormality. Wound #2 status is Open. Original cause of wound was Pressure Injury. The date acquired was: 07/09/2022. The wound has been in treatment 22 weeks. The wound is located on the Left,Dorsal Foot. The wound measures 3.4cm length x 3cm width x 0.1cm depth; 8.011cm^2 area and 0.801cm^3 volume. There is Fat Layer (Subcutaneous Tissue) exposed. There is no tunneling or undermining noted. There is a medium amount of serosanguineous drainage noted. The wound margin is flat and intact. There is medium (34-66%) red, pink granulation within the wound bed. There is a medium (34-66%) amount of necrotic tissue within the wound bed including Adherent Slough. The periwound skin appearance had no abnormalities noted for color. The periwound skin appearance exhibited: Scarring, Maceration. The periwound skin appearance did not  exhibit: Dry/Scaly.  Periwound temperature was noted as No Abnormality. 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 There was a Selective/Open Wound Skin/Epidermis Debridement with a total area of 33.6 sq cm performed by Fredirick Maudlin, MD. With the following instrument(s): Curette to remove Non-Viable tissue/material. Material removed includes [facility] and Skin: Epidermis and. No specimens were taken. A time out was conducted at 09:41, prior to the start of the procedure. A Minimum amount of bleeding was controlled with Pressure. The procedure was tolerated well. Post Debridement Measurements: 7cm length x 4.8cm width x 0.3cm depth; 7.917cm^3 volume. Character of Wound/Ulcer Post Debridement requires further debridement. Severity of Tissue Post Debridement is: Fat layer exposed. Post procedure Diagnosis Wound #1: Same as Pre-Procedure General Notes: Scribed for Dr. Celine Ahr by Blanche East, RN. 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 There was a Selective/Open Wound Non-Viable Tissue Debridement with a total area of 10.2 sq cm performed by Fredirick Maudlin, MD. With the following instrument(s): Curette to remove Non-Viable tissue/material. Material removed includes [facility]. No specimens were taken. A time out was conducted at 09:41, prior to the start of the procedure. A Minimum amount of  bleeding was controlled with Pressure. The procedure was tolerated well. Post Debridement Measurements: 3.4cm length x 3cm width x 0.1cm depth; 0.801cm^3 volume. Character of Wound/Ulcer Post Debridement requires further debridement. Severity of Tissue Post Debridement is: Fat layer exposed. Post procedure Diagnosis Wound #2: Same as Pre-Procedure General Notes: Scribed for Dr. Celine Ahr by Blanche East, RN. Plan Follow-up Appointments: Return Appointment in 1 week. - Dr. 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 Protect dressing(s) with water repellant cover (for example, large plastic bag) or a cast cover and may then take shower. Negative Presssure Wound Therapy: Wound Vac to wound continuously at 175m/hg pressure - to left calcaneus Black Foam Edema Control - Lymphedema / SCD / Other: Avoid standing for long periods of time. Off-Loading: Wound #1 Left Calcaneus: Other: - use knee scooter to ambulate, minimal weight bearing right foot Home Health: Dressing changes to be completed by [facility] Monday / Wednesday / Friday [facility] Please do not cover Dorsal wound on Lt ft with wound vac drape. This dressing needs to be changed daily due to the increased drainage. The peri-wound continues to be macerated. Other Home Health Orders/Instructions: - [facility] Hyperbaric Oxygen Therapy: Wound #1 Left Calcaneus: Evaluate for HBO Therapy - Continue HBOT for additional 20 treatments for total of 60 treatments Indication: - chronic refractory osteomyelitis If appropriate for treatment, begin HBOT per protocol: 2.5 ATA for 90 Minutes with 2 Five (5) Minute Air  Breaks T Number of Treatments: - 60 otal One treatments per day (delivered Monday through Friday unless  otherwise specified in Special Instructions below): Finger stick Blood Glucose Pre- and Post- HBOT Treatment. Follow Hyperbaric Oxygen Glycemia Protocol Afrin (Oxymetazoline HCL) 0.05% nasal spray - 1 spray in both nostrils daily as needed prior to HBO treatment for difficulty clearing ears WOUND #1: - Calcaneus Wound Laterality: Left Cleanser: Normal Saline 3 x Per Week/30 Days Discharge Instructions: Cleanse the wound with Normal Saline prior to applying a clean dressing using gauze sponges, not tissue or cotton balls. Cleanser: Soap and Water 3 x Per Week/30 Days Discharge Instructions: May shower and wash wound with dial antibacterial soap and water prior to dressing change. Peri-Wound Care: keystone 3 x Per Week/30 Days Prim Dressing: VAC 3 x Per Week/30 Days ary Secured With: Coban Self-Adherent Wrap 4x5 (in/yd) 3 x Per Week/30 Days Discharge Instructions: Secure with Coban as directed. Secured With: [employer], 4.5x3.1 (in/yd) 3 x Per Week/30 Days Discharge Instructions: Secure with Kerlix as directed. WOUND #2: - Foot Wound Laterality: Dorsal, Left Cleanser: Normal Saline Discharge Instructions: Cleanse the wound with Normal Saline prior to applying a clean dressing using gauze sponges, not tissue or cotton balls. Cleanser: Soap and Water Discharge Instructions: May shower and wash wound with dial antibacterial soap and water prior to dressing change. Peri-Wound Care: keystone Prim Dressing: Promogran Prisma Matrix, 4.34 (sq in) (silver collagen) ary Discharge Instructions: Moisten collagen with hydrogel Secondary Dressing: Zetuvit Plus Silicone Border Dressing 4x4 (in/in) Discharge Instructions: Apply silicone border over primary dressing as directed. Secured With: Coban Self-Adherent Wrap 4x5 (in/yd) Discharge Instructions: Secure with Coban as directed. Secured  With: [employer], 4.5x3.1 (in/yd) Discharge Instructions: Secure with Kerlix as directed. 02/14/2023: The dorsal foot wound looks a little bit better with just a light layer of slough. Unfortunately, there is more bone exposed at the calcaneus and I am concerned that we are nearing a point at which his leg will not be salvageable. I used a curette to debride slough off of the top of his foot and skin and slough from the plantar calcaneal wound. Continue keystone, silver alginate, wound VAC. I had a frank discussion with Mr. Bolotin today regarding the salvageability of his limb. I think if Dr. Stanford Breed is unable to improve the blood flow to the leg, then we are unlikely to be able to prevent an amputation. Understandably, Mr. Pontes did not take this particularly well. He will follow-up in 1 week. Electronic Signature(s) Signed: 02/14/2023 11:02:33 AM By: Fredirick Maudlin MD FACS Previous Signature: 02/14/2023 11:01:28 AM Version By: Fredirick Maudlin MD FACS Entered By: Fredirick Maudlin on 02/14/2023 11:02:33 -------------------------------------------------------------------------------- HxROS Details Patient Name: Date of Service: [name], [name] NA LD R. 02/14/2023 9:15 A M Medical Record Number: [id number] Patient Account Number: [id number] Date of Birth/Sex: Treating RN: Mar 17, 1956 (66 y.o. M) Primary Care Provider: Nelda Bucks Other Clinician: Referring Provider: Treating Provider/Extender: Doristine Bosworth in Treatment: 22 Eyes Medical History: [name], [name] ([id number]) [id number] Page 11 of 13 Positive for: Cataracts Negative for: Glaucoma; Optic Neuritis Ear/Nose/Mouth/Throat Medical History: Negative for: Chronic sinus problems/congestion; Middle ear problems Respiratory Medical History: Positive for: Sleep Apnea Cardiovascular Medical History: Positive for: Hypertension Past Medical History  Notes: hyperlipidemia Gastrointestinal Medical History: Negative for: Cirrhosis ; Colitis; Crohns; Hepatitis A; Hepatitis B; Hepatitis C Endocrine Medical History: Positive for: Type II Diabetes Time with diabetes: 20 years Treated with: Insulin Blood sugar tested every day: Yes Tested : 2-3 Genitourinary Medical History: Past Medical History Notes: AKI Immunological Medical History: Negative for: Lupus Erythematosus; Raynauds;  Scleroderma Musculoskeletal Medical History: Positive for: Osteomyelitis - 2023 Neurologic Medical History: Positive for: Neuropathy - Bila lower extremities Oncologic Medical History: Negative for: Received Chemotherapy; Received Radiation Psychiatric Medical History: Negative for: Anorexia/bulimia; Confinement Anxiety HBO Extended History Items Eyes: Cataracts Immunizations Pneumococcal Vaccine: Received Pneumococcal Vaccination: Yes Received Pneumococcal Vaccination On or After 60th Birthday: Yes Implantable [address] ANDRAY, DINOLFO ([id number]) [id number] Page 12 of 13 Hospitalization / Surgery History Type of Hospitalization/Surgery I and D Left calcaneus back surgery- laminectomy eye surgery- Bila cataracts shoulder arthroscopy Family and Social History Cancer: Yes - Father,Mother; Diabetes: Yes - Mother,Father; Heart Disease: No; Hereditary Spherocytosis: No; Hypertension: No; Kidney Disease: No; Lung Disease: Yes - Father; Seizures: No; Stroke: No; Thyroid Problems: Yes - Mother; Tuberculosis: No; Never smoker; Marital Status - Married; Alcohol Use: Never; Drug Use: No History; Caffeine Use: Daily; Financial Concerns: No; Food, Clothing or Shelter Needs: No; Support System Lacking: No; Transportation Concerns: No Electronic Signature(s) Signed: 02/14/2023 12:33:57 PM By: Fredirick Maudlin MD FACS Entered By: Fredirick Maudlin on 02/14/2023  10:57:31 -------------------------------------------------------------------------------- SuperBill Details Patient Name: Date of Service: [name], [name] NA LD R. 02/14/2023 Medical Record Number: [id number] Patient Account Number: [id number] Date of Birth/Sex: Treating RN: Apr 06, 1956 (67 y.o. 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, left ankle and foot E11.65 Type 2 diabetes mellitus with hyperglycemia E11.621 Type 2 diabetes mellitus with foot ulcer Facility Procedures : The patient participates with Medicare or their insurance follows the Medicare Facility Guidelines: CPT4 Code Description Modifier Quantity [id number] - DEBRIDE WOUND 1ST 20 SQ CM OR < 1 ICD-10 Diagnosis Description L97.424 Non-pressure chronic ulcer  of left heel and midfoot with necrosis of bone L97.523 Non-pressure chronic ulcer of other part of left foot with necrosis of muscle : The patient participates with Medicare or their insurance follows the Medicare Facility Guidelines: [id number] - DEBRIDE WOUND EA ADDL 20 SQ CM 2 ICD-10 Diagnosis Description 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 L97.523 Non-pressure chronic ulcer of other part of left foot with necrosis of muscle : The patient participates with Medicare or their insurance follows the Medicare Facility Guidelines: [id number] - WOUND VAC-50 SQ CM OR LESS 1 Physician Procedures : CPT4 Code Description Modifier [id number] - WC PHYS LEVEL 4 - EST PT 25 ICD-10 Diagnosis Description L97.424 Non-pressure chronic ulcer of left heel and midfoot  with necrosis of bone L97.523 Non-pressure chronic ulcer of other part of left foot with  necrosis of 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, 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 Quantity: 1 : [id number] - WC PHYS DEBR WO ANESTH 20 SQ CM ICD-10 Diagnosis Description 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

## 2023-02-18 ENCOUNTER — Ambulatory Visit (HOSPITAL_COMMUNITY)
Admission: RE | Disposition: A | Discharge status: Home / Self Care | Payer: Self-pay | Source: Home / Self Care | Attending: Vascular Surgery

## 2023-02-18 ENCOUNTER — Other Ambulatory Visit: Payer: Self-pay

## 2023-02-18 ENCOUNTER — Observation Stay (HOSPITAL_BASED_OUTPATIENT_CLINIC_OR_DEPARTMENT_OTHER)
Admission: RE | Admit: 2023-02-18 | Discharge: 2023-02-19 | Disposition: A | Discharge status: Home / Self Care | Payer: Medicare Other | Source: Home / Self Care | Attending: Vascular Surgery | Admitting: Vascular Surgery

## 2023-02-18 DIAGNOSIS — I70248 Atherosclerosis of native arteries of left leg with ulceration of other part of lower left leg: Secondary | ICD-10-CM | POA: Insufficient documentation

## 2023-02-18 DIAGNOSIS — Z7982 Long term (current) use of aspirin: Secondary | ICD-10-CM | POA: Insufficient documentation

## 2023-02-18 DIAGNOSIS — Z79899 Other long term (current) drug therapy: Secondary | ICD-10-CM | POA: Insufficient documentation

## 2023-02-18 DIAGNOSIS — E11621 Type 2 diabetes mellitus with foot ulcer: Secondary | ICD-10-CM | POA: Insufficient documentation

## 2023-02-18 DIAGNOSIS — I70244 Atherosclerosis of native arteries of left leg with ulceration of heel and midfoot: Secondary | ICD-10-CM | POA: Diagnosis not present

## 2023-02-18 DIAGNOSIS — I7025 Atherosclerosis of native arteries of other extremities with ulceration: Secondary | ICD-10-CM

## 2023-02-18 DIAGNOSIS — Z794 Long term (current) use of insulin: Secondary | ICD-10-CM | POA: Insufficient documentation

## 2023-02-18 DIAGNOSIS — M8660 Other chronic osteomyelitis, unspecified site: Secondary | ICD-10-CM | POA: Diagnosis present

## 2023-02-18 DIAGNOSIS — I10 Essential (primary) hypertension: Secondary | ICD-10-CM | POA: Insufficient documentation

## 2023-02-18 DIAGNOSIS — L97429 Non-pressure chronic ulcer of left heel and midfoot with unspecified severity: Secondary | ICD-10-CM | POA: Insufficient documentation

## 2023-02-18 HISTORY — PX: ABDOMINAL AORTOGRAM W/LOWER EXTREMITY: CATH118223

## 2023-02-18 LAB — POCT I-STAT, CHEM 8
BUN: 27 mg/dL — ABNORMAL HIGH (ref 8–23)
Calcium, Ion: 1.32 mmol/L (ref 1.15–1.40)
Chloride: 103 mmol/L (ref 98–111)
Creatinine, Ser: 1 mg/dL (ref 0.61–1.24)
Glucose, Bld: 374 mg/dL — ABNORMAL HIGH (ref 70–99)
HCT: 33 % — ABNORMAL LOW (ref 39.0–52.0)
Hemoglobin: 11.2 g/dL — ABNORMAL LOW (ref 13.0–17.0)
Potassium: 4.5 mmol/L (ref 3.5–5.1)
Sodium: 137 mmol/L (ref 135–145)
TCO2: 27 mmol/L (ref 22–32)

## 2023-02-18 LAB — GLUCOSE, CAPILLARY
Glucose-Capillary: 337 mg/dL — ABNORMAL HIGH (ref 70–99)
Glucose-Capillary: 256 mg/dL — ABNORMAL HIGH (ref 70–99)

## 2023-02-18 SURGERY — ABDOMINAL AORTOGRAM W/LOWER EXTREMITY
Anesthesia: LOCAL | Laterality: Left

## 2023-02-18 MED ORDER — SODIUM CHLORIDE 0.9% FLUSH: 3.0000 mL | INTRAVENOUS | Status: DC | PRN

## 2023-02-18 MED ORDER — SODIUM CHLORIDE 0.9 % WEIGHT BASED INFUSION: 1.0000 mL/kg/h | INTRAVENOUS | Status: AC

## 2023-02-18 MED ORDER — IODIXANOL 320 MG/ML IV SOLN
INTRAVENOUS | Status: DC | PRN
  Administered 2023-02-18: 51 mL via INTRA_ARTERIAL

## 2023-02-18 MED ORDER — HEPARIN (PORCINE) IN NACL 1000-0.9 UT/500ML-% IV SOLN
INTRAVENOUS | Status: DC | PRN
  Administered 2023-02-18 (×2): 500 mL

## 2023-02-18 MED ORDER — SODIUM CHLORIDE 0.9 % IV SOLN: INTRAVENOUS | Status: DC

## 2023-02-18 MED ORDER — MIDAZOLAM HCL 5 MG/5ML IJ SOLN
INTRAMUSCULAR | Status: AC
  Filled 2023-02-18: qty 5

## 2023-02-18 MED ORDER — ACETAMINOPHEN 325 MG PO TABS: 650.0000 mg | ORAL_TABLET | ORAL | Status: DC | PRN

## 2023-02-18 MED ORDER — LABETALOL HCL 5 MG/ML IV SOLN: 10.0000 mg | INTRAVENOUS | Status: DC | PRN

## 2023-02-18 MED ORDER — SODIUM CHLORIDE 0.9% FLUSH
3.0000 mL | Freq: Two times a day (BID) | INTRAVENOUS | Status: DC
  Administered 2023-02-19: 3 mL via INTRAVENOUS

## 2023-02-18 MED ORDER — ONDANSETRON HCL 4 MG/2ML IJ SOLN: 4.0000 mg | Freq: Four times a day (QID) | INTRAMUSCULAR | Status: DC | PRN

## 2023-02-18 MED ORDER — LIDOCAINE HCL (PF) 1 % IJ SOLN
INTRAMUSCULAR | Status: DC | PRN
  Administered 2023-02-18: 10 mL

## 2023-02-18 MED ORDER — LIDOCAINE HCL (PF) 1 % IJ SOLN
INTRAMUSCULAR | Status: AC
  Filled 2023-02-18: qty 30

## 2023-02-18 MED ORDER — FENTANYL CITRATE (PF) 100 MCG/2ML IJ SOLN
INTRAMUSCULAR | Status: DC | PRN
  Administered 2023-02-18: 50 ug via INTRAVENOUS

## 2023-02-18 MED ORDER — INSULIN ASPART 100 UNIT/ML IJ SOLN
10.0000 [IU] | Freq: Once | INTRAMUSCULAR | Status: AC
  Administered 2023-02-18: 10 [IU] via SUBCUTANEOUS
  Filled 2023-02-18: qty 1

## 2023-02-18 MED ORDER — HYDRALAZINE HCL 20 MG/ML IJ SOLN: 5.0000 mg | INTRAMUSCULAR | Status: DC | PRN

## 2023-02-18 MED ORDER — SODIUM CHLORIDE 0.9 % IV SOLN: 250.0000 mL | INTRAVENOUS | Status: DC | PRN

## 2023-02-18 MED ORDER — MIDAZOLAM HCL 2 MG/2ML IJ SOLN
INTRAMUSCULAR | Status: DC | PRN
  Administered 2023-02-18: 1 mg via INTRAVENOUS

## 2023-02-18 MED ORDER — FENTANYL CITRATE (PF) 100 MCG/2ML IJ SOLN
INTRAMUSCULAR | Status: AC
  Filled 2023-02-18: qty 2

## 2023-02-18 SURGICAL SUPPLY — 12 items
CATH OMNI FLUSH 5F 65CM (CATHETERS) IMPLANT
DEVICE CLOSURE MYNXGRIP 5F (Vascular Products) IMPLANT
KIT MICROPUNCTURE NIT STIFF (SHEATH) IMPLANT
KIT PV (KITS) ×2 IMPLANT
SHEATH PINNACLE 5F 10CM (SHEATH) IMPLANT
SHEATH PROBE COVER 6X72 (BAG) IMPLANT
STOPCOCK MORSE 400PSI 3WAY (MISCELLANEOUS) IMPLANT
SYR MEDRAD MARK 7 150ML (SYRINGE) ×2 IMPLANT
TRANSDUCER W/STOPCOCK (MISCELLANEOUS) ×2 IMPLANT
TRAY PV CATH (CUSTOM PROCEDURE TRAY) ×2 IMPLANT
TUBING CIL FLEX 10 FLL-RA (TUBING) IMPLANT
WIRE BENTSON .035X145CM (WIRE) IMPLANT

## 2023-02-18 NOTE — Interval H&P Note (Signed)
History and Physical Interval Note:  02/18/2023 12:02 PM  [name]  has presented today for surgery, with the diagnosis of pad with ulcer.  The various methods of treatment have been discussed with the patient and family. After consideration of risks, benefits and other options for treatment, the patient has consented to  Procedure(s): ABDOMINAL AORTOGRAM W/LOWER EXTREMITY (Left) as a surgical intervention.  The patient's history has been reviewed, patient examined, no change in status, stable for surgery.  I have reviewed the patient's chart and labs.  Questions were answered to the patient's satisfaction.     Cherre Robins

## 2023-02-18 NOTE — Op Note (Signed)
DATE OF SERVICE: 02/18/2023  PATIENT:  [name]  67 y.o. male  PRE-OPERATIVE DIAGNOSIS:  Atherosclerosis of native arteries of left lower extremity causing ulceration  POST-OPERATIVE DIAGNOSIS:  Same  PROCEDURE:   1) Ultrasound guided right common femoral artery access 2) Aortogram 3) Left lower extremity angiogram with second order cannulation  4) Conscious sedation (25 minutes)   SURGEON:  Yevonne Aline. Stanford Breed, MD  ASSISTANT: none  ANESTHESIA:   local and IV sedation  ESTIMATED BLOOD LOSS: minimal  LOCAL MEDICATIONS USED:  LIDOCAINE   COUNTS: confirmed correct.  PATIENT DISPOSITION:  PACU - hemodynamically stable.   Delay start of Pharmacological VTE agent (>24hrs) due to surgical blood loss or risk of bleeding: no  INDICATION FOR PROCEDURE: [name] is a 67 y.o. male with chronic left heel ulceration with osteomyelitis. After careful discussion of risks, benefits, and alternatives the patient was offered angiography. The patient understood and wished to proceed.  OPERATIVE FINDINGS:  Terminal aorta and iliac arteries: Widely patent  Right lower extremity: Common femoral artery: Widely patent  Profunda femoris artery: Widely patent  Superficial femoral artery: Widely patent Popliteal artery: Widely patent Anterior tibial artery: occluded Tibioperoneal trunk: Widely patent Peroneal artery: Widely patent Posterior tibial artery: Widely patent Pedal circulation: fills via PT  DESCRIPTION OF PROCEDURE: After identification of the patient in the pre-operative holding area, the patient was transferred to the operating room. The patient was positioned supine on the operating room table.  Anesthesia was induced. The groins was prepped and draped in standard fashion. A surgical pause was performed confirming correct patient, procedure, and operative location.  The left groin was anesthetized with subcutaneous injection of 1% lidocaine. Using ultrasound guidance,  the left common femoral artery was accessed with micropuncture technique. Fluoroscopy was used to confirm cannulation over the femoral head. The 74F sheath was upsized to 41F.   A Benson wire was advanced into the distal aorta. Over the wire an omni flush catheter was advanced to the level of L2. Aortogram was performed - see above for details.   The right common iliac artery was selected with an omniflush catheter and benson guidewire. The wire was advanced into the common femoral artery. Over the wire the omni flush catheter was advanced into the external iliac artery. Selective angiography was performed - see above for details.   A mynx device was used to close the arteriotomy. Hemostasis was adequate upon completion.  Conscious sedation was administered with the use of IV fentanyl and midazolam under continuous physician and nurse monitoring.  Heart rate, blood pressure, and oxygen saturation were continuously monitored.  Total sedation time was 25 minutes  Upon completion of the case instrument and sharps counts were confirmed correct. The patient was transferred to the PACU in good condition. I was present for all portions of the procedure.  PLAN: Optimized from a vascular standpoint. Needs ASA '81mg'$  PO QD; high intensity statin therapy. I think BKA is his best option going forward.   Yevonne Aline. Stanford Breed, MD [facility] Phone Number: [phone] 02/18/2023 12:02 PM

## 2023-02-18 NOTE — Progress Notes (Signed)
Pt admitted to [facility] 21 from [facility] cath lab.  Pt is A&OX4 and neuro intact.  Pt placed on telemetry and CCMD notified.  Vitals taken.  Right groin site is level 0 with no signs of hematoma or bleeding.  Pt is currently comfortable and not in pain.  Pt is oriented to unit with call light in reach.    02/18/23 1836  Vitals  Temp 98.4 F (36.9 C)  Temp Source Oral  BP (!) 160/79  MAP (mmHg) 101  BP Location Left Arm  BP Method Automatic  Patient Position (if appropriate) Lying  Pulse Rate 83  Pulse Rate Source Monitor  ECG Heart Rate 83  Resp 15  Level of Consciousness  Level of Consciousness Alert  Oxygen Therapy  SpO2 100 %  O2 Device Room Air  Pain Assessment  Pain Scale 0-10  Pain Score 0  POSS Scale ([place]ero Opioid Sedation Scale)  POSS *See Group Information* 1-Acceptable,Awake and alert  PCA/Epidural/Spinal Assessment  Respiratory Pattern Regular;Unlabored  [name] Coma Scale  Eye Opening 4  Best Verbal Response (NON-intubated) 5  Best Motor Response 6  [name] Coma Scale Score 15  MEWS Score  MEWS Temp 0  MEWS Systolic 0  MEWS Pulse 0  MEWS RR 0  MEWS LOC 0  MEWS Score 0  MEWS Score Color Green

## 2023-02-19 LAB — COMPREHENSIVE METABOLIC PANEL
ALT: 11 U/L (ref 0–44)
AST: 11 U/L — ABNORMAL LOW (ref 15–41)
Albumin: 2.7 g/dL — ABNORMAL LOW (ref 3.5–5.0)
Alkaline Phosphatase: 72 U/L (ref 38–126)
Anion gap: 8 (ref 5–15)
BUN: 18 mg/dL (ref 8–23)
CO2: 26 mmol/L (ref 22–32)
Calcium: 9.2 mg/dL (ref 8.9–10.3)
Chloride: 102 mmol/L (ref 98–111)
Creatinine, Ser: 1.14 mg/dL (ref 0.61–1.24)
GFR, Estimated: >60 mL/min (ref >60)
Glucose, Bld: 259 mg/dL — ABNORMAL HIGH (ref 70–99)
Potassium: 4.3 mmol/L (ref 3.5–5.1)
Sodium: 136 mmol/L (ref 135–145)
Total Bilirubin: 0.9 mg/dL (ref 0.3–1.2)
Total Protein: 6.1 g/dL — ABNORMAL LOW (ref 6.5–8.1)

## 2023-02-19 LAB — LIPID PANEL
Cholesterol: 136 mg/dL (ref 0–200)
HDL: 24 mg/dL — ABNORMAL LOW (ref >40)
LDL Cholesterol: 91 mg/dL (ref 0–99)
Total CHOL/HDL Ratio: 5.7 RATIO
Triglycerides: 106 mg/dL (ref <150)
VLDL: 21 mg/dL (ref 0–40)

## 2023-02-19 NOTE — Progress Notes (Signed)
Patient alert and oriented , verbalized understanding of dc instructions. All belongings and dc papers given to patient.

## 2023-02-19 NOTE — Discharge Instructions (Signed)
° °  [facility] [place] ° °Discharge Instructions ° °Lower Extremity Angiogram; Angioplasty/Stenting ° °Please refer to the following instructions for your post-procedure care. Your surgeon or physician assistant will discuss any changes with you. ° °Activity ° °Avoid lifting more than 8 pounds (1 gallons of milk) for 72 hours (3 days) after your procedure. You may walk as much as you can tolerate. It's OK to drive after 72 hours. ° °Bathing/Showering ° °You may shower the day after your procedure. If you have a bandage, you may remove it at 24- 48 hours. Clean your incision site with mild soap and water. Pat the area dry with a clean towel. ° °Diet ° °Resume your pre-procedure diet. There are no special food restrictions following this procedure. All patients with peripheral vascular disease should follow a low fat/low cholesterol diet. In order to heal from your surgery, it is CRITICAL to get adequate nutrition. Your body requires vitamins, minerals, and protein. Vegetables are the best source of vitamins and minerals. Vegetables also provide the perfect balance of protein. Processed food has little nutritional value, so try to avoid this. ° °Medications ° °Resume taking all of your medications unless your doctor tells you not to. If your incision is causing pain, you may take over-the-counter pain relievers such as acetaminophen (Tylenol) ° °Follow Up ° °Follow up will be arranged at the time of your procedure. You may have an office visit scheduled or may be scheduled for surgery. Ask your surgeon if you have any questions. ° °Please call us immediately for any of the following conditions: °•Severe or worsening pain your legs or feet at rest or with walking. °•Increased pain, redness, drainage at your groin puncture site. °•Fever of 101 degrees or higher. °•If you have any mild or slow bleeding from your puncture site: lie down, apply firm constant pressure over the area with a piece of  gauze or a clean wash cloth for 30 minutes- no peeking!, call 911 right away if you are still bleeding after 30 minutes, or if the bleeding is heavy and unmanageable. ° °Reduce your risk factors of vascular disease: ° °Stop smoking. If you would like help call [facility] at 1-800-QUIT-NOW (1-800-784-8669) or [place] at 336-586-4000. °Manage your cholesterol °Maintain a desired weight °Control your diabetes °Keep your blood pressure down ° °If you have any questions, please call the office at [phone] ° °

## 2023-02-19 NOTE — Progress Notes (Signed)
  Progress Note    02/19/2023 7:17 AM 1 Day Post-Op  Subjective:  upset that the office did not tell him he would have to stay in the hospital if he did not have anyone to stay with him.  Wound vac battery is dead and the cord is at home.   Afebrile HR 70's-80's NSR 782'U-235'T systolic 614% RA  Vitals:   02/19/23 0200 02/19/23 0346  BP:  (!) 156/76  Pulse:  79  Resp:  15  Temp:  98 F (36.7 C)  SpO2: 94% 98%    Physical Exam: General:  no distress Lungs:  non labored Incisions:  right groin is soft without hematoma Extremities:  right foot warm; wound vac on left foot Abdomen:  soft  CBC    Component Value Date/Time   WBC 10.6 (H) 12/14/2022 1125   RBC 4.74 12/14/2022 1125   HGB 11.2 (L) 02/18/2023 0944   HCT 33.0 (L) 02/18/2023 0944   PLT 327 12/14/2022 1125   MCV 80.6 12/14/2022 1125   MCH 26.6 12/14/2022 1125   MCHC 33.0 12/14/2022 1125   RDW 14.4 12/14/2022 1125   LYMPHSABS 1.0 12/14/2022 1125   MONOABS 0.7 12/14/2022 1125   EOSABS 0.2 12/14/2022 1125   BASOSABS 0.1 12/14/2022 1125    BMET    Component Value Date/Time   NA 136 02/19/2023 0131   K 4.3 02/19/2023 0131   CL 102 02/19/2023 0131   CO2 26 02/19/2023 0131   GLUCOSE 259 (H) 02/19/2023 0131   BUN 18 02/19/2023 0131   CREATININE 1.14 02/19/2023 0131   CALCIUM 9.2 02/19/2023 0131   GFRNONAA >60 02/19/2023 0131   GFRAA >60 09/21/2019 0753    INR No results found for: "INR"   Intake/Output Summary (Last 24 hours) at 02/19/2023 0717 Last data filed at 02/19/2023 0553 Gross per 24 hour  Intake --  Output 2450 ml  Net -2450 ml      Assessment/Plan:  67 y.o. male is s/p:  Agm via right CFA of RLE  1 Day Post-Op   -pt right groin soft without hematoma and right foot is warm.  He was admitted bc he did not have anyone to stay with him overnight. -pt optimized from vascular standpoint. -continue asa/statin -f/u with Dr. Stanford Breed in a couple of weeks.    Leontine Locket, PA-C [facility] [phone] 02/19/2023 7:17 AM

## 2023-02-19 NOTE — Discharge Summary (Signed)
Discharge Summary    [name] 10-18-1956 67 y.o. male  [id number]  Admission Date: 02/18/2023  Discharge Date: 02/19/2023  Physician: Cherre Robins, MD  Admission Diagnosis: Chronic osteomyelitis [facility]) [M86.60]   HPI:   This is a 67 y.o. male who has been struggling with chronic osteomyelitis of the left heel.I counseled him extensively about this problem and the limb threatening problem he is facing. We had a long discussion about this problem and that even with a successful revascularization, he may need a BKA.  He strongly desires limb salvage.   Hospital Course:  The patient was admitted to the hospital and taken to the [facility] lab on 02/18/2023 and underwent: 1) Ultrasound guided right common femoral artery access 2) Aortogram 3) Left lower extremity angiogram with second order cannulation  4) Conscious sedation (25 minutes)    Findings as follows: OPERATIVE FINDINGS:  Terminal aorta and iliac arteries: Widely patent   Right lower extremity: Common femoral artery: Widely patent  Profunda femoris artery: Widely patent  Superficial femoral artery: Widely patent Popliteal artery: Widely patent Anterior tibial artery: occluded Tibioperoneal trunk: Widely patent Peroneal artery: Widely patent Posterior tibial artery: Widely patent Pedal circulation: fills via PT  The pt tolerated the procedure well and was transported to the PACU in good condition.   He was admitted as he did not have anyone to stay with him.  He has a soft right groin without hematoma and right foot is warm.    CBC    Component Value Date/Time   WBC 10.6 (H) 12/14/2022 1125   RBC 4.74 12/14/2022 1125   HGB 11.2 (L) 02/18/2023 0944   HCT 33.0 (L) 02/18/2023 0944   PLT 327 12/14/2022 1125   MCV 80.6 12/14/2022 1125   MCH 26.6 12/14/2022 1125   MCHC 33.0 12/14/2022 1125   RDW 14.4 12/14/2022 1125   LYMPHSABS 1.0 12/14/2022 1125   MONOABS 0.7 12/14/2022 1125   EOSABS 0.2 12/14/2022 1125    BASOSABS 0.1 12/14/2022 1125    BMET    Component Value Date/Time   NA 136 02/19/2023 0131   K 4.3 02/19/2023 0131   CL 102 02/19/2023 0131   CO2 26 02/19/2023 0131   GLUCOSE 259 (H) 02/19/2023 0131   BUN 18 02/19/2023 0131   CREATININE 1.14 02/19/2023 0131   CALCIUM 9.2 02/19/2023 0131   GFRNONAA >60 02/19/2023 0131   GFRAA >60 09/21/2019 0753      Discharge Instructions     Call MD for:  redness, tenderness, or signs of infection (pain, swelling, bleeding, redness, odor or green/yellow discharge around incision site)   Complete by: As directed    Call MD for:  severe or increased pain, loss or decreased feeling  in affected limb(s)   Complete by: As directed    Call MD for:  temperature >100.5   Complete by: As directed    Discharge patient   Complete by: As directed    Discharge disposition: 01-Home or Self Care   Discharge patient date: 02/19/2023   Resume previous diet   Complete by: As directed        Discharge Diagnosis:  Chronic osteomyelitis ([place]) [M86.60]  Secondary Diagnosis: Patient Active Problem List   Diagnosis Date Noted   Chronic osteomyelitis ([place]) 02/18/2023   Osteomyelitis of foot ([place]) 09/21/2019   Acute osteomyelitis of left calcaneus ([place])    Cubital tunnel syndrome 08/29/2018   AKI (acute kidney injury) ([place]) 06/09/2018   DM2 (diabetes mellitus, type 2) (  [place]) 06/09/2018   HTN (hypertension) 06/09/2018   Dehydration 06/09/2018   Diabetic peripheral neuropathy ([place]) 09/08/2015   Ulcer of right foot with fat layer exposed ([place]) 09/08/2015   Primary osteoarthritis of left knee 10/23/2014   Tear of medial meniscus of left knee, initial encounter 10/23/2014   Past Medical History:  Diagnosis Date   Diabetic foot ulcers (HCC)    DM2 (diabetes mellitus, type 2) ([place])    HTN (hypertension)    Sleep apnea    No Cpap     Allergies as of 02/19/2023       Reactions   Bee Venom Anaphylaxis, Swelling, Rash      Cefepime Rash   Unclear  if due to cefepime        Medication List     TAKE these medications    aspirin EC 81 MG tablet Take 81 mg by mouth daily.   atorvastatin 10 MG tablet Commonly known as: LIPITOR Take 10 mg by mouth at bedtime.   B-D UF III MINI PEN NEEDLES 31G X 5 MM Misc Generic drug: Insulin Pen Needle USE AS DIRECTED UP TO 3 TIMES DAILY   cyanocobalamin 1000 MCG tablet Commonly known as: VITAMIN B12 Take 1,000 mcg by mouth daily.   EpiPen 2-Pak 0.3 mg/0.3 mL Soaj injection Generic drug: EPINEPHrine Inject 0.3 mg into the muscle as needed for anaphylaxis.   ferrous sulfate 325 (65 FE) MG tablet Take 325 mg by mouth daily with breakfast.   FreeStyle Libre 14 Day Sensor Misc Use to check blood glucose   furosemide 40 MG tablet Commonly known as: LASIX Take 40 mg by mouth daily.   losartan 100 MG tablet Commonly known as: COZAAR Take 100 mg by mouth daily.   NovoLOG FlexPen 100 UNIT/ML FlexPen Generic drug: insulin aspart Inject 5-11 Units into the skin 3 (three) times daily with meals. Sliding scale   potassium chloride 10 MEQ tablet Commonly known as: KLOR-CON Take 10 mEq by mouth daily.   propranolol 20 MG tablet Commonly known as: INDERAL Take 20 mg by mouth 2 (two) times daily.   Tyler Aas FlexTouch 200 UNIT/ML FlexTouch Pen Generic drug: insulin degludec Inject 50-85 Units into the skin See admin instructions. Take 85 units in the morning and 50 units at bedtime   Vitamin D3 50 MCG (2000 UT) Caps Generic drug: Cholecalciferol Take 2,000 Units by mouth daily.          Instructions:  [facility]  Discharge Instructions  Lower Extremity Angiogram; Angioplasty/Stenting  Please refer to the following instructions for your post-procedure care. Your surgeon or physician assistant will discuss any changes with you.  Activity  Avoid lifting more than 8 pounds (1 gallons of milk) for 72 hours (3 days) after your procedure. You  may walk as much as you can tolerate. It's OK to drive after 72 hours.  Bathing/Showering  You may shower the day after your procedure. If you have a bandage, you may remove it at 24- 48 hours. Clean your incision site with mild soap and water. Pat the area dry with a clean towel.  Diet  Resume your pre-procedure diet. There are no special food restrictions following this procedure. All patients with peripheral vascular disease should follow a low fat/low cholesterol diet. In order to heal from your surgery, it is CRITICAL to get adequate nutrition. Your body requires vitamins, minerals, and protein. Vegetables are the best source of vitamins and minerals. Vegetables also provide the perfect  balance of protein. Processed food has little nutritional value, so try to avoid this.  Medications  Resume taking all of your medications unless your doctor tells you not to. If your incision is causing pain, you may take over-the-counter pain relievers such as acetaminophen (Tylenol)  Follow Up  Follow up will be arranged at the time of your procedure. You may have an office visit scheduled or may be scheduled for surgery. Ask your surgeon if you have any questions.  Please call us immediately for any of the following conditions: Severe or worsening pain your legs or feet at rest or with walking. Increased pain, redness, drainage at your groin puncture site. Fever of 101 degrees or higher. If you have any mild or slow bleeding from your puncture site: lie down, apply firm constant pressure over the area with a piece of gauze or a clean wash cloth for 30 minutes- no peeking!, call 911 right away if you are still bleeding after 30 minutes, or if the bleeding is heavy and unmanageable.  Reduce your risk factors of vascular disease:  Stop smoking. If you would like help call [facility] at 1-800-QUIT-NOW 480-809-3345) or [place] at (810)805-1340. Manage your cholesterol Maintain a desired  weight Control your diabetes Keep your blood pressure down  If you have any questions, please call the office at [phone]  Prescriptions given: none  Disposition: home  Patient's condition: is Good  Follow up: 1. [facility] as needed   Leontine Locket, PA-C [facility] [phone] 02/19/2023  7:58 AM

## 2023-02-21 ENCOUNTER — Encounter (HOSPITAL_BASED_OUTPATIENT_CLINIC_OR_DEPARTMENT_OTHER): Payer: Medicare Other | Admitting: General Surgery

## 2023-02-21 ENCOUNTER — Encounter (HOSPITAL_BASED_OUTPATIENT_CLINIC_OR_DEPARTMENT_OTHER): Payer: Self-pay

## 2023-02-21 ENCOUNTER — Encounter (HOSPITAL_COMMUNITY): Payer: Self-pay | Admitting: Vascular Surgery

## 2023-02-21 ENCOUNTER — Emergency Department (HOSPITAL_COMMUNITY): Payer: Medicare Other

## 2023-02-21 ENCOUNTER — Inpatient Hospital Stay (HOSPITAL_COMMUNITY)
Admission: EM | Admit: 2023-02-21 | Discharge: 2023-02-23 | DRG: 638 | Disposition: A | Discharge status: Home / Self Care | Payer: Medicare Other | Attending: Internal Medicine | Admitting: Internal Medicine

## 2023-02-21 DIAGNOSIS — Z794 Long term (current) use of insulin: Secondary | ICD-10-CM

## 2023-02-21 DIAGNOSIS — E119 Type 2 diabetes mellitus without complications: Secondary | ICD-10-CM

## 2023-02-21 DIAGNOSIS — Z888 Allergy status to other drugs, medicaments and biological substances status: Secondary | ICD-10-CM | POA: Diagnosis not present

## 2023-02-21 DIAGNOSIS — E669 Obesity, unspecified: Secondary | ICD-10-CM | POA: Diagnosis present

## 2023-02-21 DIAGNOSIS — Z79899 Other long term (current) drug therapy: Secondary | ICD-10-CM

## 2023-02-21 DIAGNOSIS — L97429 Non-pressure chronic ulcer of left heel and midfoot with unspecified severity: Secondary | ICD-10-CM | POA: Diagnosis present

## 2023-02-21 DIAGNOSIS — Z91030 Bee allergy status: Secondary | ICD-10-CM | POA: Diagnosis not present

## 2023-02-21 DIAGNOSIS — Z9841 Cataract extraction status, right eye: Secondary | ICD-10-CM

## 2023-02-21 DIAGNOSIS — E1151 Type 2 diabetes mellitus with diabetic peripheral angiopathy without gangrene: Secondary | ICD-10-CM | POA: Diagnosis present

## 2023-02-21 DIAGNOSIS — E1165 Type 2 diabetes mellitus with hyperglycemia: Secondary | ICD-10-CM | POA: Diagnosis not present

## 2023-02-21 DIAGNOSIS — L97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: Secondary | ICD-10-CM | POA: Diagnosis not present

## 2023-02-21 DIAGNOSIS — E11621 Type 2 diabetes mellitus with foot ulcer: Principal | ICD-10-CM | POA: Diagnosis present

## 2023-02-21 DIAGNOSIS — D649 Anemia, unspecified: Secondary | ICD-10-CM | POA: Diagnosis present

## 2023-02-21 DIAGNOSIS — M86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, left ankle and foot: Secondary | ICD-10-CM | POA: Diagnosis present

## 2023-02-21 DIAGNOSIS — I70248 Atherosclerosis of native arteries of left leg with ulceration of other part of lower left leg: Secondary | ICD-10-CM | POA: Diagnosis present

## 2023-02-21 DIAGNOSIS — Z5320 Procedure and treatment not carried out because of patient's decision for unspecified reasons: Secondary | ICD-10-CM | POA: Diagnosis not present

## 2023-02-21 DIAGNOSIS — L97426 Non-pressure chronic ulcer of left heel and midfoot with bone involvement without evidence of necrosis: Secondary | ICD-10-CM | POA: Diagnosis not present

## 2023-02-21 DIAGNOSIS — Z881 Allergy status to other antibiotic agents status: Secondary | ICD-10-CM

## 2023-02-21 DIAGNOSIS — Z7982 Long term (current) use of aspirin: Secondary | ICD-10-CM | POA: Diagnosis not present

## 2023-02-21 DIAGNOSIS — Z9842 Cataract extraction status, left eye: Secondary | ICD-10-CM | POA: Diagnosis not present

## 2023-02-21 DIAGNOSIS — E871 Hypo-osmolality and hyponatremia: Secondary | ICD-10-CM | POA: Diagnosis present

## 2023-02-21 DIAGNOSIS — G4733 Obstructive sleep apnea (adult) (pediatric): Secondary | ICD-10-CM | POA: Diagnosis present

## 2023-02-21 DIAGNOSIS — I10 Essential (primary) hypertension: Secondary | ICD-10-CM | POA: Diagnosis present

## 2023-02-21 DIAGNOSIS — M8660 Other chronic osteomyelitis, unspecified site: Secondary | ICD-10-CM | POA: Diagnosis present

## 2023-02-21 DIAGNOSIS — Z6831 Body mass index (BMI) 31.0-31.9, adult: Secondary | ICD-10-CM

## 2023-02-21 DIAGNOSIS — E1142 Type 2 diabetes mellitus with diabetic polyneuropathy: Secondary | ICD-10-CM | POA: Diagnosis present

## 2023-02-21 DIAGNOSIS — E1169 Type 2 diabetes mellitus with other specified complication: Secondary | ICD-10-CM | POA: Diagnosis present

## 2023-02-21 DIAGNOSIS — E8809 Other disorders of plasma-protein metabolism, not elsewhere classified: Secondary | ICD-10-CM | POA: Diagnosis present

## 2023-02-21 DIAGNOSIS — Z961 Presence of intraocular lens: Secondary | ICD-10-CM | POA: Diagnosis present

## 2023-02-21 DIAGNOSIS — L97523 Non-pressure chronic ulcer of other part of left foot with necrosis of muscle: Secondary | ICD-10-CM | POA: Diagnosis not present

## 2023-02-21 DIAGNOSIS — A419 Sepsis, unspecified organism: Principal | ICD-10-CM

## 2023-02-21 LAB — CBC WITH DIFFERENTIAL/PLATELET
Abs Immature Granulocytes: 0.05 10*3/uL (ref 0.00–0.07)
Basophils Absolute: 0 10*3/uL (ref 0.0–0.1)
Basophils Relative: 0 %
Eosinophils Absolute: 0.1 10*3/uL (ref 0.0–0.5)
Eosinophils Relative: 1 %
HCT: 34.3 % — ABNORMAL LOW (ref 39.0–52.0)
Hemoglobin: 11.5 g/dL — ABNORMAL LOW (ref 13.0–17.0)
Immature Granulocytes: 1 %
Lymphocytes Relative: 7 %
Lymphs Abs: 0.6 10*3/uL — ABNORMAL LOW (ref 0.7–4.0)
MCH: 27.6 pg (ref 26.0–34.0)
MCHC: 33.5 g/dL (ref 30.0–36.0)
MCV: 82.5 fL (ref 80.0–100.0)
Monocytes Absolute: 0.6 10*3/uL (ref 0.1–1.0)
Monocytes Relative: 7 %
Neutro Abs: 7 10*3/uL (ref 1.7–7.7)
Neutrophils Relative %: 84 %
Platelets: 220 10*3/uL (ref 150–400)
RBC: 4.16 MIL/uL — ABNORMAL LOW (ref 4.22–5.81)
RDW: 13.3 % (ref 11.5–15.5)
WBC: 8.3 10*3/uL (ref 4.0–10.5)
nRBC: 0 % (ref 0.0–0.2)

## 2023-02-21 LAB — GLUCOSE, CAPILLARY
Glucose-Capillary: 226 mg/dL — ABNORMAL HIGH (ref 70–99)
Glucose-Capillary: 107 mg/dL — ABNORMAL HIGH (ref 70–99)

## 2023-02-21 LAB — COMPREHENSIVE METABOLIC PANEL
ALT: 10 U/L (ref 0–44)
AST: 13 U/L — ABNORMAL LOW (ref 15–41)
Albumin: 3.6 g/dL (ref 3.5–5.0)
Alkaline Phosphatase: 81 U/L (ref 38–126)
Anion gap: 6 (ref 5–15)
BUN: 27 mg/dL — ABNORMAL HIGH (ref 8–23)
CO2: 25 mmol/L (ref 22–32)
Calcium: 9 mg/dL (ref 8.9–10.3)
Chloride: 101 mmol/L (ref 98–111)
Creatinine, Ser: 0.96 mg/dL (ref 0.61–1.24)
GFR, Estimated: >60 mL/min (ref >60)
Glucose, Bld: 137 mg/dL — ABNORMAL HIGH (ref 70–99)
Potassium: 3.7 mmol/L (ref 3.5–5.1)
Sodium: 132 mmol/L — ABNORMAL LOW (ref 135–145)
Total Bilirubin: 1.8 mg/dL — ABNORMAL HIGH (ref 0.3–1.2)
Total Protein: 8.1 g/dL (ref 6.5–8.1)

## 2023-02-21 LAB — SEDIMENTATION RATE: Sed Rate: 86 mm/hr — ABNORMAL HIGH (ref 0–16)

## 2023-02-21 LAB — C-REACTIVE PROTEIN: CRP: 14.5 mg/dL — ABNORMAL HIGH (ref <1.0)

## 2023-02-21 MED ORDER — ACETAMINOPHEN 500 MG PO TABS
1000.0000 mg | ORAL_TABLET | Freq: Once | ORAL | Status: AC
  Administered 2023-02-21: 1000 mg via ORAL
  Filled 2023-02-21: qty 2

## 2023-02-21 MED ORDER — SODIUM CHLORIDE 0.9 % IV SOLN
2.0000 g | Freq: Once | INTRAVENOUS | Status: AC
  Administered 2023-02-21: 2 g via INTRAVENOUS
  Filled 2023-02-21: qty 20

## 2023-02-21 MED ORDER — SODIUM CHLORIDE 0.9 % IV BOLUS
30.0000 mL/kg | Freq: Once | INTRAVENOUS | Status: AC
  Administered 2023-02-21: 2466 mL via INTRAVENOUS

## 2023-02-21 MED ORDER — SODIUM CHLORIDE 0.9 % IV SOLN
2.0000 g | INTRAVENOUS | Status: DC
  Administered 2023-02-22: 2 g via INTRAVENOUS
  Filled 2023-02-21: qty 20

## 2023-02-21 MED ORDER — METRONIDAZOLE 500 MG/100ML IV SOLN
500.0000 mg | Freq: Two times a day (BID) | INTRAVENOUS | Status: DC
  Administered 2023-02-22 (×3): 500 mg via INTRAVENOUS
  Filled 2023-02-21 (×3): qty 100

## 2023-02-21 NOTE — ED Triage Notes (Signed)
BIB EMS/ left diabetic foot ulcer x 3 years/ getting worse past few days/ fever today of 101.2 @ home/ chills

## 2023-02-21 NOTE — Progress Notes (Signed)
ARLISS, OUTHOUSE R ([id number]) [id number] Page 1 of 2 Visit Report for 02/21/2023 Arrival Information Details Patient Name: Date of Service: [place], [name] 02/21/2023 8:30 A M Medical Record Number: [id number] Patient Account Number: [id number] Date of Birth/Sex: Treating RN: 1956/05/25 (67 y.o. [name], Meta.Reding Primary Care Kaytlynn Kochan: Nelda Bucks Other Clinician: Valeria Batman Referring Tela Kotecki: Treating Mikhai Bienvenue/Extender: Doristine Bosworth in Treatment: 23 Visit Information History Since Last Visit All ordered tests and consults were completed: Yes Patient Arrived: Knee Scooter Added or deleted any medications: No Arrival Time: 08:35 Any new allergies or adverse reactions: No Accompanied By: None Had a fall or experienced change in No Transfer Assistance: None activities of daily living that may affect Patient Identification Verified: Yes risk of falls: Secondary Verification Process Completed: Yes Signs or symptoms of abuse/neglect since last visito No Patient Requires Transmission-Based Precautions: No Hospitalized since last visit: No Patient Has Alerts: No Implantable device outside of the clinic excluding No cellular tissue based products placed in the center since last visit: Pain Present Now: No Electronic Signature(s) Signed: 02/21/2023 12:22:03 PM By: Valeria Batman EMT Entered By: Valeria Batman on 02/21/2023 12:22:03 -------------------------------------------------------------------------------- Encounter Discharge Information Details Patient Name: Date of Service: [place], [name] 02/21/2023 8:30 A M Medical Record Number: [id number] Patient Account Number: [id number] Date of Birth/Sex: Treating RN: 1956-04-28 (67 y.o. [name], [name] Primary Care Deondra Labrador: Nelda Bucks Other Clinician: Valeria Batman Referring Kiyo Heal: Treating Malya Cirillo/Extender: Doristine Bosworth in  Treatment: 23 Encounter Discharge Information Items Discharge Condition: Stable Ambulatory Status: Knee Scooter Discharge Destination: Home Transportation: Private Auto Accompanied By: None Schedule Follow-up Appointment: Yes Clinical Summary of Care: Electronic Signature(s) Signed: 02/21/2023 12:35:24 PM By: Valeria Batman EMT Entered By: Valeria Batman on 02/21/2023 12:35:24 -------------------------------------------------------------------------------- Vitals Details Patient Name: Date of Service: [name], [name] 02/21/2023 8:30 A M Medical Record Number: [id number] Patient Account Number: [id number] Date of Birth/Sex: Treating RN: 1956/04/04 (67 y.o. [name] Primary Care Ruba Outen: Nelda Bucks Other Clinician: Valeria Batman Referring Reeda Soohoo: Treating Zion Lint/Extender: Doristine Bosworth in Treatment: 23 Vital Signs Time Taken: 08:39 Capillary Blood Glucose (mg/dl): Straug[place], [place] ([id number]) [phone].pdf Page 2 of 2 Height (in): 74 Reference Range: 80 - 120 mg / dl Weight (lbs): 245 Body Mass Index (BMI): 31.5 Electronic Signature(s) Signed: 02/21/2023 12:22:26 PM By: Valeria Batman EMT Entered By: Valeria Batman on 02/21/2023 12:22:25

## 2023-02-21 NOTE — ED Provider Notes (Signed)
[facility] GENERAL SURGERY Provider Note  CSN: [id number] Arrival date & time: 02/21/23 1857  Chief Complaint(s) Foot Pain and Fever  HPI [name] is a 67 y.o. male history of diabetes, hypertension, chronic osteomyelitis presenting to the emergency department with left foot pain.  Reports that over the past few days he has had a increased pain over the left heel.  He also reports redness.  He has had fevers and chills at home.  No nausea or vomiting.  No cough.  No runny nose, no sore throat.  No chest pain, shortness of breath.  Has followed with wound care.   Past Medical History Past Medical History:  Diagnosis Date   Diabetic foot ulcers ([place])    DM2 (diabetes mellitus, type 2) ([place])    HTN (hypertension)    Sleep apnea    No Cpap   Patient Active Problem List   Diagnosis Date Noted   Diabetic foot ulcer ([place]) 02/21/2023   Chronic osteomyelitis ([place]) 02/18/2023   Osteomyelitis of foot ([place]) 09/21/2019   Acute osteomyelitis of left calcaneus (HCC)    Cubital tunnel syndrome 08/29/2018   AKI (acute kidney injury) ([place]) 06/09/2018   DM2 (diabetes mellitus, type 2) ([place]) 06/09/2018   HTN (hypertension) 06/09/2018   Dehydration 06/09/2018   Diabetic peripheral neuropathy ([place]) 09/08/2015   Ulcer of right foot with fat layer exposed ([place]) 09/08/2015   Primary osteoarthritis of left knee 10/23/2014   Tear of medial meniscus of left knee, initial encounter 10/23/2014   Home Medication(s) Prior to Admission medications   Medication Sig Start Date End Date Taking? Authorizing Provider  aspirin EC 81 MG tablet Take 81 mg by mouth daily.     [provider]  atorvastatin (LIPITOR) 10 MG tablet Take 10 mg by mouth at bedtime.    [provider]  B-D UF III MINI PEN NEEDLES 31G X 5 MM MISC USE AS DIRECTED UP TO 3 TIMES DAILY 02/28/19   [provider]  Cholecalciferol (VITAMIN D3) 50 MCG (2000 UT) CAPS Take 2,000 Units by  mouth daily.    [provider]  Continuous Blood Gluc Sensor (FREESTYLE LIBRE 14 DAY SENSOR) MISC Use to check blood glucose 01/22/19   [provider]  cyanocobalamin (VITAMIN B12) 1000 MCG tablet Take 1,000 mcg by mouth daily.    [provider]  EPINEPHrine (EPIPEN 2-PAK) 0.3 mg/0.3 mL IJ SOAJ injection Inject 0.3 mg into the muscle as needed for anaphylaxis.     [provider]  ferrous sulfate 325 (65 FE) MG tablet Take 325 mg by mouth daily with breakfast.    [provider]  furosemide (LASIX) 40 MG tablet Take 40 mg by mouth daily.     [provider]  losartan (COZAAR) 100 MG tablet Take 100 mg by mouth daily.    [provider]  NOVOLOG FLEXPEN 100 UNIT/ML FlexPen Inject 5-11 Units into the skin 3 (three) times daily with meals. Sliding scale 02/15/19   [provider]  potassium chloride (K-DUR) 10 MEQ tablet Take 10 mEq by mouth daily. 04/12/18   [provider]  propranolol (INDERAL) 20 MG tablet Take 20 mg by mouth 2 (two) times daily. 12/24/22   [provider]  TRESIBA FLEXTOUCH 200 UNIT/ML SOPN Inject 50-85 Units into the skin See admin instructions. Take 85 units in the morning and 50 units at bedtime 09/04/19   [provider]  Past Surgical History Past Surgical History:  Procedure Laterality Date   ABDOMINAL AORTOGRAM W/LOWER EXTREMITY Left 02/18/2023   Procedure: ABDOMINAL AORTOGRAM W/LOWER EXTREMITY;  Surgeon: Cherre Robins, MD;  Location: [facility];  Service: Cardiovascular;  Laterality: Left;   BACK SURGERY     Laminectomy-Bilateral   EYE SURGERY     Bilateral cataracts and eye implants   I & D EXTREMITY Left 09/21/2019   Procedure: LEFT PARTIAL CALCANEOUS EXCISION;  Surgeon: [name], MD;  Location: [facility];  Service: Orthopedics;   Laterality: Left;   KNEE ARTHROSCOPY Left    SHOULDER ARTHROSCOPY     Right   Family History No family history on file.  Social History Social History   Tobacco Use   Smoking status: Never   Smokeless tobacco: Never  Vaping Use   Vaping Use: Never used  Substance Use Topics   Alcohol use: Yes    Comment: rarely   Drug use: Never   Allergies Bee venom and Cefepime  Review of Systems Review of Systems  All other systems reviewed and are negative.   Physical Exam Vital Signs  I have reviewed the triage vital signs BP (!) 159/78 (BP Location: Right Arm)   Pulse (!) 113   Temp 99.1 F (37.3 C) (Oral)   Resp 18   Wt 111.6 kg   SpO2 98%   BMI 31.58 kg/m  Physical Exam Vitals and nursing note reviewed.  Constitutional:      General: He is not in acute distress.    Appearance: Normal appearance.     Comments: Rigors in room  HENT:     Mouth/Throat:     Mouth: Mucous membranes are moist.  Eyes:     Conjunctiva/sclera: Conjunctivae normal.  Cardiovascular:     Rate and Rhythm: Regular rhythm. Tachycardia present.  Pulmonary:     Effort: Pulmonary effort is normal. No respiratory distress.     Breath sounds: Normal breath sounds.  Abdominal:     General: Abdomen is flat.     Palpations: Abdomen is soft.     Tenderness: There is no abdominal tenderness.  Musculoskeletal:     Right lower leg: No edema.     Left lower leg: No edema.  Skin:    General: Skin is warm and dry.     Capillary Refill: Capillary refill takes less than 2 seconds.     Comments: Significant erythema, edema and warmth, over the heel of the left foot with very large approximately 3 x 3 cm ulcer with granulation tissue at the base, small amount of purulent discharge  Neurological:     Mental Status: He is alert and oriented to person, place, and time. Mental status is at baseline.  Psychiatric:        Mood and Affect: Mood normal.        Behavior: Behavior normal.     ED Results and  Treatments Labs (all labs ordered are listed, but only abnormal results are displayed) Labs Reviewed  COMPREHENSIVE METABOLIC PANEL - Abnormal; Notable for the following components:      Result Value   Sodium 132 (*)    Glucose, Bld 137 (*)    BUN 27 (*)    AST 13 (*)    Total Bilirubin 1.8 (*)    All other components within normal limits  CBC WITH DIFFERENTIAL/PLATELET - Abnormal; Notable for the following components:   RBC 4.16 (*)    Hemoglobin 11.5 (*)  HCT 34.3 (*)    Lymphs Abs 0.6 (*)    All other components within normal limits  SEDIMENTATION RATE - Abnormal; Notable for the following components:   Sed Rate 86 (*)    All other components within normal limits  C-REACTIVE PROTEIN - Abnormal; Notable for the following components:   CRP 14.5 (*)    All other components within normal limits  CULTURE, BLOOD (ROUTINE X 2)  CULTURE, BLOOD (ROUTINE X 2)                                                                                                                          Radiology DG Foot 2 Views Left  Result Date: 02/21/2023 CLINICAL DATA:  Foot infection EXAM: LEFT FOOT - 2 VIEW COMPARISON:  08/06/2019, MRI 08/19/2022 FINDINGS: Large plantar heel ulcer extending to the surface of the calcaneus bone. Bony remodeling of calcaneus with heterogeneous sclerosis. 2.2 cm focal lucency in the calcaneus bone probably corresponding to intra osseous abscess on prior MRI. Vascular calcifications. Old appearing deformity of the first proximal phalanx. Moderate degenerative change at the first MTP joint. IMPRESSION: Large plantar heel ulcer extending to the surface of the calcaneus bone with bony remodeling and heterogeneous sclerosis of the calcaneus likely due to chronic osteomyelitis. 2.2 cm focal lucency in the calcaneus bone probably corresponding to intra osseous abscess on prior MRI. Electronically Signed   By: Donavan Foil M.D.   On: 02/21/2023 21:39    Pertinent labs & imaging  results that were available during my care of the patient were reviewed by me and considered in my medical decision making (see MDM for details).  Medications Ordered in ED Medications  metroNIDAZOLE (FLAGYL) IVPB 500 mg (has no administration in time range)  cefTRIAXone (ROCEPHIN) 2 g in sodium chloride 0.9 % 100 mL IVPB (has no administration in time range)  sodium chloride 0.9 % bolus 2,466 mL (2,466 mLs Intravenous New Bag/Given 02/21/23 2040)  acetaminophen (TYLENOL) tablet 1,000 mg (1,000 mg Oral Given 02/21/23 2038)  cefTRIAXone (ROCEPHIN) 2 g in sodium chloride 0.9 % 100 mL IVPB (0 g Intravenous Stopped 02/21/23 2231)                                                                                                                                     Procedures .Critical Care  Performed by: Cristie Hem, MD Authorized by:  Cristie Hem, MD   Critical care provider statement:    Critical care time (minutes):  30   Critical care was necessary to treat or prevent imminent or life-threatening deterioration of the following conditions:  Sepsis   Critical care was time spent personally by me on the following activities:  Development of treatment plan with patient or surrogate, discussions with consultants, evaluation of patient's response to treatment, examination of patient, ordering and review of laboratory studies, ordering and review of radiographic studies, ordering and performing treatments and interventions, pulse oximetry, re-evaluation of patient's condition and review of old charts   (including critical care time)  Medical Decision Making / ED Course   MDM:  67 year old male presenting to the emergency department with heel pain.  On exam, patient has large ulcer to the left heel.  He has overlying erythema.  He is febrile and having rigors.  May have early sepsis.  ESR and CRP are elevated.  X-ray shows chronic osteomyelitis.  Will discuss with orthopedic surgery and  hospitalist.  Low concern for alternative cause of his symptoms such as pneumonia, UTI, URI  Clinical Course as of 02/22/23 0001  Mon Feb 21, 2023  2221 Discussed with Dr. Marlou Sa with orth[facility]are. Requests transfer to [facility] when admitted as [facility].  [WS]  2235 Discussed with Dr. Sabino Gasser who will place admission orders for [facility].  [WS]    Clinical Course User Index [WS] Cristie Hem, MD     Additional history obtained: -Additional history obtained from ems -External records from outside source obtained and reviewed including: Chart review including previous notes, labs, imaging, consultation notes including previous admission and wound care for ulcer   Lab Tests: -I ordered, reviewed, and interpreted labs.   The pertinent results include:   Labs Reviewed  COMPREHENSIVE METABOLIC PANEL - Abnormal; Notable for the following components:      Result Value   Sodium 132 (*)    Glucose, Bld 137 (*)    BUN 27 (*)    AST 13 (*)    Total Bilirubin 1.8 (*)    All other components within normal limits  CBC WITH DIFFERENTIAL/PLATELET - Abnormal; Notable for the following components:   RBC 4.16 (*)    Hemoglobin 11.5 (*)    HCT 34.3 (*)    Lymphs Abs 0.6 (*)    All other components within normal limits  SEDIMENTATION RATE - Abnormal; Notable for the following components:   Sed Rate 86 (*)    All other components within normal limits  C-REACTIVE PROTEIN - Abnormal; Notable for the following components:   CRP 14.5 (*)    All other components within normal limits  CULTURE, BLOOD (ROUTINE X 2)  CULTURE, BLOOD (ROUTINE X 2)    Notable for elevated ESR, CRP     Imaging Studies ordered: I ordered imaging studies including XR left heel On my interpretation imaging demonstrates chronic osteomyelitis I independently visualized and interpreted imaging. I agree with the radiologist interpretation   Medicines ordered and prescription drug management: Meds ordered this  encounter  Medications   sodium chloride 0.9 % bolus 2,466 mL   acetaminophen (TYLENOL) tablet 1,000 mg   cefTRIAXone (ROCEPHIN) 2 g in sodium chloride 0.9 % 100 mL IVPB    Order Specific Question:   Antibiotic Indication:    Answer:   Cellulitis   metroNIDAZOLE (FLAGYL) IVPB 500 mg    Order Specific Question:   Antibiotic Indication:    Answer:  Other Indication (list below)    Order Specific Question:   Other Indication:    Answer:   anaerobic coverage   cefTRIAXone (ROCEPHIN) 2 g in sodium chloride 0.9 % 100 mL IVPB    Order Specific Question:   Antibiotic Indication:    Answer:   Other Indication (list below)    Order Specific Question:   Other Indication:    Answer:   diabetic foot ulcer    -I have reviewed the patients home medicines and have made adjustments as needed   Consultations Obtained: I requested consultation with the orthopeidst,  and discussed lab and imaging findings as well as pertinent plan - they recommend: agree with admission, they will see patient   Cardiac Monitoring: The patient was maintained on a cardiac monitor.  I personally viewed and interpreted the cardiac monitored which showed an underlying rhythm of: sinus tachycarda  Social Determinants of Health:  Diagnosis or treatment significantly limited by social determinants of health: obesity   Reevaluation: After the interventions noted above, I reevaluated the patient and found that their symptoms have improved  Co morbidities that complicate the patient evaluation  Past Medical History:  Diagnosis Date   Diabetic foot ulcers ([place])    DM2 (diabetes mellitus, type 2) ([place])    HTN (hypertension)    Sleep apnea    No Cpap      Dispostion: Disposition decision including need for hospitalization was considered, and patient admitted to the hospital.    Final Clinical Impression(s) / ED Diagnoses Final diagnoses:  Sepsis, due to unspecified organism, unspecified whether acute organ  dysfunction present [facility])  Diabetic ulcer of left heel associated with type 2 diabetes mellitus, with bone involvement without evidence of necrosis ([place])     This chart was dictated using voice recognition software.  Despite best efforts to proofread,  errors can occur which can change the documentation meaning.    Cristie Hem, MD 02/22/23 0001

## 2023-02-21 NOTE — Progress Notes (Signed)
EBRIMA, SAVIDGE R ([id number]) [id number] Page 1 of 2 Visit Report for 02/21/2023 HBO Details Patient Name: Date of Service: [name], [name] 02/21/2023 8:30 A M Medical Record Number: [id number] Patient Account Number: [id number] Date of Birth/Sex: Treating RN: 1956-11-16 (67 y.o. 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 HBO Treatment Details Treatment Number: 56 Patient Type: Outpatient Chamber Type: Monoplace Chamber Serial #: [id number] Treatment Protocol: 2.5 ATA with 90 minutes oxygen, with two 5 minute air breaks Treatment Details Compression Rate Down: 1.5 psi / minute De-Compression Rate Up: 2.0 psi / minute A breaks and breathing ir Compress Tx Pressure periods Decompress Decompress Begins Reached (leave unused spaces Begins Ends blank) Chamber Pressure (ATA 1 2.5 2.5 2.5 2.5 2.5 - - 2.5 1 ) Clock Time (24 hr) 09:05 09:23 09:53 09:58 10:28 10:33 - - 11:03 11:13 Treatment Length: 128 (minutes) Treatment Segments: 4 Vital Signs Capillary Blood Glucose Reference Range: 80 - 120 mg / dl HBO Diabetic Blood Glucose Intervention Range: <131 mg/dl or >249 mg/dl Time Vitals Blood Respiratory Capillary Blood Glucose Pulse Action Type: Pulse: Temperature: Taken: Pressure: Rate: Glucose (mg/dl): Meter #: Oximetry (%) Taken: Pre 08:39 226 Post 11:19 131/65 87 18 98.4 107 Pre 08:57 119/60 99 16 98.4 Treatment Response Treatment Toleration: Well Treatment Completion Status: Treatment Completed without Adverse Event Treatment Notes The patient did complain of some pressure at 3 PSI at  travel rate of 1.0PSI/min. He was able to clear. Travel rate of 1.0 PSI/min until 8PSI. The patient did not have any other problems clearing. Travel rete set to 1.5 PSI/min until 14 PSI, then increased to 2.0 PSI/min to 2 ATA. No other problems noted. Post treatment blood sugar was 107. The patient stated that he was going to get something to eat. Physician HBO Attestation: I certify that I supervised this HBO treatment in accordance with Medicare guidelines. A trained emergency response team is readily available per Yes hospital policies and procedures. Continue HBOT as ordered. Yes Electronic Signature(s) Signed: 02/22/2023 7:42:58 AM By: Fredirick Maudlin MD FACS Previous Signature: 02/21/2023 12:33:22 PM Version By: Valeria Batman EMT Entered By: Fredirick Maudlin on 02/22/2023 07:42:57 -------------------------------------------------------------------------------- HBO Safety Checklist Details Patient Name: Date of Service: [place], [place] LD R. 02/21/2023 8:30 A M Medical Record Number: [id number] Patient Account Number: [id number] JERMALE, LABRUM ([id number]) [id number] Page 2 of 2 Date of Birth/Sex: Treating RN: [birth year] (67 y.o. [name], Meta.Reding Primary Care Aryanah Enslow: Other Clinician: Leanor Rubenstein Referring Rozella Servello: Treating Kennie Snedden/Extender: Doristine Bosworth in Treatment: 23 HBO Safety Checklist Items Safety Checklist Consent Form Signed Patient voided / foley secured and emptied When did you last eato 0715 Last dose of injectable or oral agent 0030 Ostomy pouch emptied and vented if applicable NA All implantable devices assessed, documented and approved NA Intravenous access site secured and place NA Valuables secured Linens and cotton and cotton/polyester blend (less than 51% polyester) Personal oil-based products / skin lotions / body lotions removed Wigs or hairpieces removed NA Smoking or tobacco  materials removed Books / newspapers / magazines / loose paper removed Cologne, aftershave, perfume and deodorant removed Jewelry removed (may wrap wedding band) Make-up removed NA Hair care products removed Battery operated devices (external)  removed Heating patches and chemical warmers removed Titanium eyewear removed NA Nail polish cured greater than 10 hours NA Casting material cured greater than 10 hours NA Hearing aids removed NA Loose dentures or partials removed removed by patient Prosthetics have been removed NA Patient demonstrates correct use of air break device (if applicable) Patient concerns have been addressed Patient grounding bracelet on and cord attached to chamber Specifics for Inpatients (complete in addition to above) Medication sheet sent with patient NA Intravenous medications needed or due during therapy sent with patient NA Drainage tubes (e.g. nasogastric tube or chest tube secured and vented) NA Endotracheal or Tracheotomy tube secured NA Cuff deflated of air and inflated with saline NA Airway suctioned NA Notes The safety checklist was done before the treatment was started. Electronic Signature(s) Signed: 02/21/2023 12:23:53 PM By: Valeria Batman EMT Entered By: Valeria Batman on 02/21/2023 12:23:53

## 2023-02-22 ENCOUNTER — Other Ambulatory Visit: Payer: Self-pay

## 2023-02-22 ENCOUNTER — Encounter (HOSPITAL_BASED_OUTPATIENT_CLINIC_OR_DEPARTMENT_OTHER): Payer: Medicare Other | Admitting: General Surgery

## 2023-02-22 DIAGNOSIS — I70248 Atherosclerosis of native arteries of left leg with ulceration of other part of lower left leg: Secondary | ICD-10-CM | POA: Diagnosis not present

## 2023-02-22 DIAGNOSIS — E11621 Type 2 diabetes mellitus with foot ulcer: Principal | ICD-10-CM

## 2023-02-22 DIAGNOSIS — L97426 Non-pressure chronic ulcer of left heel and midfoot with bone involvement without evidence of necrosis: Secondary | ICD-10-CM | POA: Diagnosis not present

## 2023-02-22 DIAGNOSIS — E1142 Type 2 diabetes mellitus with diabetic polyneuropathy: Secondary | ICD-10-CM

## 2023-02-22 DIAGNOSIS — M8660 Other chronic osteomyelitis, unspecified site: Secondary | ICD-10-CM | POA: Diagnosis not present

## 2023-02-22 DIAGNOSIS — Z794 Long term (current) use of insulin: Secondary | ICD-10-CM

## 2023-02-22 DIAGNOSIS — I10 Essential (primary) hypertension: Secondary | ICD-10-CM

## 2023-02-22 DIAGNOSIS — L97509 Non-pressure chronic ulcer of other part of unspecified foot with unspecified severity: Secondary | ICD-10-CM

## 2023-02-22 LAB — GLUCOSE, CAPILLARY
Glucose-Capillary: 64 mg/dL — ABNORMAL LOW (ref 70–99)
Glucose-Capillary: 186 mg/dL — ABNORMAL HIGH (ref 70–99)
Glucose-Capillary: 198 mg/dL — ABNORMAL HIGH (ref 70–99)
Glucose-Capillary: 149 mg/dL — ABNORMAL HIGH (ref 70–99)
Glucose-Capillary: 301 mg/dL — ABNORMAL HIGH (ref 70–99)
Glucose-Capillary: 202 mg/dL — ABNORMAL HIGH (ref 70–99)

## 2023-02-22 LAB — CBC WITH DIFFERENTIAL/PLATELET
Abs Immature Granulocytes: 0.02 10*3/uL (ref 0.00–0.07)
Basophils Absolute: 0 10*3/uL (ref 0.0–0.1)
Basophils Relative: 0 %
Eosinophils Absolute: 0.1 10*3/uL (ref 0.0–0.5)
Eosinophils Relative: 2 %
HCT: 31.4 % — ABNORMAL LOW (ref 39.0–52.0)
Hemoglobin: 10.2 g/dL — ABNORMAL LOW (ref 13.0–17.0)
Immature Granulocytes: 0 %
Lymphocytes Relative: 9 %
Lymphs Abs: 0.5 10*3/uL — ABNORMAL LOW (ref 0.7–4.0)
MCH: 27.3 pg (ref 26.0–34.0)
MCHC: 32.5 g/dL (ref 30.0–36.0)
MCV: 84.2 fL (ref 80.0–100.0)
Monocytes Absolute: 0.4 10*3/uL (ref 0.1–1.0)
Monocytes Relative: 7 %
Neutro Abs: 3.9 10*3/uL (ref 1.7–7.7)
Neutrophils Relative %: 82 %
Platelets: 198 10*3/uL (ref 150–400)
RBC: 3.73 MIL/uL — ABNORMAL LOW (ref 4.22–5.81)
RDW: 13.4 % (ref 11.5–15.5)
WBC: 4.9 10*3/uL (ref 4.0–10.5)
nRBC: 0 % (ref 0.0–0.2)

## 2023-02-22 LAB — BASIC METABOLIC PANEL
Anion gap: 9 (ref 5–15)
BUN: 20 mg/dL (ref 8–23)
CO2: 22 mmol/L (ref 22–32)
Calcium: 8.6 mg/dL — ABNORMAL LOW (ref 8.9–10.3)
Chloride: 103 mmol/L (ref 98–111)
Creatinine, Ser: 1.09 mg/dL (ref 0.61–1.24)
GFR, Estimated: >60 mL/min (ref >60)
Glucose, Bld: 163 mg/dL — ABNORMAL HIGH (ref 70–99)
Potassium: 3.8 mmol/L (ref 3.5–5.1)
Sodium: 134 mmol/L — ABNORMAL LOW (ref 135–145)

## 2023-02-22 LAB — HEPATIC FUNCTION PANEL
ALT: 11 U/L (ref 0–44)
AST: 11 U/L — ABNORMAL LOW (ref 15–41)
Albumin: 3.3 g/dL — ABNORMAL LOW (ref 3.5–5.0)
Alkaline Phosphatase: 64 U/L (ref 38–126)
Bilirubin, Direct: 0.2 mg/dL (ref 0.0–0.2)
Indirect Bilirubin: 0.8 mg/dL (ref 0.3–0.9)
Total Bilirubin: 1 mg/dL (ref 0.3–1.2)
Total Protein: 6.7 g/dL (ref 6.5–8.1)

## 2023-02-22 LAB — SEDIMENTATION RATE: Sed Rate: 80 mm/hr — ABNORMAL HIGH (ref 0–16)

## 2023-02-22 LAB — PHOSPHORUS: Phosphorus: 2.4 mg/dL — ABNORMAL LOW (ref 2.5–4.6)

## 2023-02-22 LAB — C-REACTIVE PROTEIN: CRP: 16.4 mg/dL — ABNORMAL HIGH (ref <1.0)

## 2023-02-22 LAB — HIV ANTIBODY (ROUTINE TESTING W REFLEX): HIV Screen 4th Generation wRfx: NONREACTIVE

## 2023-02-22 LAB — MAGNESIUM: Magnesium: 2.1 mg/dL (ref 1.7–2.4)

## 2023-02-22 MED ORDER — ASPIRIN 81 MG PO TBEC
81.0000 mg | DELAYED_RELEASE_TABLET | Freq: Every day | ORAL | Status: DC
  Administered 2023-02-22 – 2023-02-23 (×2): 81 mg via ORAL
  Filled 2023-02-22 (×2): qty 1

## 2023-02-22 MED ORDER — POTASSIUM & SODIUM PHOSPHATES 280-160-250 MG PO PACK
1.0000 | PACK | Freq: Three times a day (TID) | ORAL | Status: DC
  Administered 2023-02-22 – 2023-02-23 (×3): 1 via ORAL
  Filled 2023-02-22 (×4): qty 1

## 2023-02-22 MED ORDER — VANCOMYCIN HCL IN DEXTROSE 1-5 GM/200ML-% IV SOLN
1000.0000 mg | Freq: Two times a day (BID) | INTRAVENOUS | Status: DC
  Administered 2023-02-23: 1000 mg via INTRAVENOUS
  Filled 2023-02-22: qty 200

## 2023-02-22 MED ORDER — ATORVASTATIN CALCIUM 40 MG PO TABS
80.0000 mg | ORAL_TABLET | Freq: Every day | ORAL | Status: DC
  Administered 2023-02-22 – 2023-02-23 (×2): 80 mg via ORAL
  Filled 2023-02-22 (×2): qty 2

## 2023-02-22 MED ORDER — ATORVASTATIN CALCIUM 10 MG PO TABS: 10.0000 mg | ORAL_TABLET | Freq: Every day | ORAL | Status: DC

## 2023-02-22 MED ORDER — INSULIN ASPART 100 UNIT/ML IJ SOLN
0.0000 [IU] | Freq: Every day | INTRAMUSCULAR | Status: DC
  Administered 2023-02-22: 2 [IU] via SUBCUTANEOUS

## 2023-02-22 MED ORDER — ACETAMINOPHEN 325 MG PO TABS: 650.0000 mg | ORAL_TABLET | Freq: Four times a day (QID) | ORAL | Status: DC | PRN

## 2023-02-22 MED ORDER — OXYCODONE HCL 5 MG PO TABS: 5.0000 mg | ORAL_TABLET | ORAL | Status: DC | PRN

## 2023-02-22 MED ORDER — PROPRANOLOL HCL 20 MG PO TABS
20.0000 mg | ORAL_TABLET | Freq: Two times a day (BID) | ORAL | Status: DC
  Administered 2023-02-22 – 2023-02-23 (×4): 20 mg via ORAL
  Filled 2023-02-22 (×4): qty 1

## 2023-02-22 MED ORDER — ACETAMINOPHEN 650 MG RE SUPP: 650.0000 mg | Freq: Four times a day (QID) | RECTAL | Status: DC | PRN

## 2023-02-22 MED ORDER — VANCOMYCIN HCL 2000 MG/400ML IV SOLN
2000.0000 mg | Freq: Once | INTRAVENOUS | Status: AC
  Administered 2023-02-22: 2000 mg via INTRAVENOUS
  Filled 2023-02-22: qty 400

## 2023-02-22 MED ORDER — ENOXAPARIN SODIUM 40 MG/0.4ML IJ SOSY
40.0000 mg | PREFILLED_SYRINGE | Freq: Every day | INTRAMUSCULAR | Status: DC
  Administered 2023-02-22 – 2023-02-23 (×2): 40 mg via SUBCUTANEOUS
  Filled 2023-02-22 (×2): qty 0.4

## 2023-02-22 MED ORDER — INSULIN GLARGINE-YFGN 100 UNIT/ML ~~LOC~~ SOLN
10.0000 [IU] | Freq: Two times a day (BID) | SUBCUTANEOUS | Status: DC
  Administered 2023-02-22 – 2023-02-23 (×3): 10 [IU] via SUBCUTANEOUS
  Filled 2023-02-22 (×4): qty 0.1

## 2023-02-22 MED ORDER — INSULIN ASPART 100 UNIT/ML IJ SOLN
0.0000 [IU] | Freq: Three times a day (TID) | INTRAMUSCULAR | Status: DC
  Administered 2023-02-22: 11 [IU] via SUBCUTANEOUS
  Administered 2023-02-22 (×2): 3 [IU] via SUBCUTANEOUS

## 2023-02-22 MED ORDER — LOSARTAN POTASSIUM 50 MG PO TABS
100.0000 mg | ORAL_TABLET | Freq: Every day | ORAL | Status: DC
  Administered 2023-02-22 – 2023-02-23 (×2): 100 mg via ORAL
  Filled 2023-02-22 (×2): qty 2

## 2023-02-22 MED ORDER — SODIUM CHLORIDE 0.9% FLUSH
3.0000 mL | Freq: Two times a day (BID) | INTRAVENOUS | Status: DC
  Administered 2023-02-22 (×3): 3 mL via INTRAVENOUS

## 2023-02-22 MED FILL — Midazolam HCl Inj 5 MG/5ML (Base Equivalent): INTRAMUSCULAR | Qty: 1 | Status: AC

## 2023-02-22 NOTE — Inpatient Diabetes Management (Signed)
Inpatient Diabetes Program Recommendations  AACE/ADA: New Consensus Statement on Inpatient Glycemic Control (2015)  Target Ranges:  Prepandial:   less than 140 mg/dL      Peak postprandial:   less than 180 mg/dL (1-2 hours)      Critically ill patients:  140 - 180 mg/dL   Lab Results  Component Value Date   GLUCAP 198 (H) 02/22/2023   HGBA1C 8.5 (H) 09/21/2019    Review of Glycemic Control  Diabetes history: DM2 Outpatient Diabetes medications: Tresiba 80 in am and 50 QHS, Novolog 5-11 TID  Current orders for Inpatient glycemic control: Novolog 0-15 TID and 0-5 HS  HgbA1C - 8.5% (09/21/19) Updated HgbA1C pending Will likely need part of basal insulin although blood sugars 64-198 mg/dL today  Inpatient Diabetes Program Recommendations:    Consider adding Semglee 10 units BID  Titrate based on FBS in am.   Spoke with pt at bedside regarding his diabetes control. Pt states his Endo is Dr Leeroy Bock and goes every 3 months. Pt verified home insulin doses and states he occasionally has hypoglycemia. Wears CGM and can tell when he's low. Treats with juice. Has been working at getting HgbA1C below 8%. Admits to dietary indiscretions and states he also has a lot of stress, which likely affects his blood sugars.  Explained how hyperglycemia leads to damage within blood vessels which lead to the common complications seen with uncontrolled diabetes. Stressed to the patient the importance of improving glycemic control to prevent further complications from uncontrolled diabetes. Discussed impact of nutrition, exercise, stress, sickness, and medications on diabetes control. Discussed importance of maintaining good glucose control to prevent long- and short-term complications.  Pt very appreciative of visit.  Continue to follow.   Thank you. Lorenda Peck, RD, LDN, [place] Inpatient Diabetes Coordinator [phone]

## 2023-02-22 NOTE — Assessment & Plan Note (Signed)
-   check A1c - continue SSI and CBG monitoring

## 2023-02-22 NOTE — Progress Notes (Signed)
Pharmacy Antibiotic Note  [name] is a 67 y.o. male admitted on 02/21/2023 with  diabetic foot ulcer and intraosseous abscess .  Pharmacy has been consulted for Vancomycin dosing.  Pt presented with fever. Managing chronic infection w/ IV abx vs BKA per pt preference. Currently being treated with Rocephin and Flagyl. Plan to broaden for MRSA coverage given lack of identified organism. Scr. Is stable (1.09 mg/dL, baseline ~1). CRP elevated 14.9>16.4 on admission. No leukocytosis.   Plan:  Start Vancomycin IV 2000 mg LD then 1000 mg Q12H, goal AUC 400-600 mcg*hr/mL Estimated AUC: 531 mcg*hr/mL Estimated C min: 15.3 mcg/mL Estimated C max: 32.3 mcg/mL  Height: '6\' 2"'$  (188 cm) Weight: 111 kg (244 lb 11.4 oz) IBW/kg (Calculated) : 82.2  Temp (24hrs), Avg:99.4 F (37.4 C), Min:98.7 F (37.1 C), Max:100.6 F (38.1 C)  Recent Labs  Lab 02/18/23 0944 02/19/23 0131 02/21/23 2030 02/22/23 0858  WBC  --   --  8.3 4.9  CREATININE 1.00 1.14 0.96 1.09    Estimated Creatinine Clearance: 87.2 mL/min (by C-G formula based on SCr of 1.09 mg/dL).    Allergies  Allergen Reactions   Bee Venom Anaphylaxis, Swelling and Rash         Cefepime Rash    Unclear if due to cefepime    Antimicrobials this admission: Ceftriaxone 3/11 >>  Flagyl 3/12 >>  Vancomycin 3/12 >>  Microbiology results: 3/12 BCx: NG <12H  Thank you for allowing pharmacy to be a part of this patient's care.  Park Liter, PharmD Candidate 02/22/2023 2:40 PM

## 2023-02-22 NOTE — Hospital Course (Addendum)
Patient with h/o chronic left foot diabetic foot ulcer with associated OM, DMII, neuropathy, HTN, and OSA who presented on 3/12 with a fever and ongoing left foot infection. He recently has been evaluated by vascular surgery and underwent left lower extremity angiogram.  He has been recommended to consider left BKA.  Left foot x-ray showed large plantar heel ulcer extending to surface of calcaneus bone and focal lucency in calcaneus bone corresponding to intraosseous abscess on prior MRI from 2020.   Patient declined transfer to [facility] and declined involvement of orthopedic surgery. He wished to have evaluation by vascular surgery and continue IV abx for now.  Dr. Donzetta Matters consulted on 3/12 - "no intervention would improve healing potential of the wound and DKA has been recommended."  Based on tragic home situation, he declines BKA despite the life-threatening nature of the problem and prefers outpatient vascular follow-up.

## 2023-02-22 NOTE — H&P (Signed)
History and Physical    [name]  [id number]  DOB: 03-13-1956  DOA: 02/21/2023  PCP: Nicoletta Dress, MD Patient coming from: Home  Chief Complaint: ongoing left foot ulcer  HPI:  [name] is a 67 yo male with PMH chronic left foot diabetic foot ulcer with associated OM, DMII, neuropathy, HTN, sleep apnea who presented with a fever and ongoing left foot infection. He recently has been evaluated by vascular surgery and underwent left lower extremity angiogram.  He has been recommended to consider left BKA per Dr. Stanford Breed when seen. Left foot x-ray showed large plantar heel ulcer extending to surface of calcaneus bone and focal lucency in calcaneus bone corresponding to intraosseous abscess on prior MRI from 2020.  Patient declined transfer to [facility] and declined involvement of orthopedic surgery. He wished to have evaluation by vascular surgery and continue IV abx for now.  He was noted to have low-grade fever in the ER, 100.6. WBC normal, 8.3.  Remainder lab work, relatively unremarkable. He was started on antibiotics and admitted for further evaluation.  I have personally briefly reviewed patient's old medical records in [facility] and discussed patient with the ER provider when appropriate/indicated.  Assessment and Plan: * Diabetic foot ulcer ([place]) - chronic minimal to non-healing wound; follows outpt with wound center too - s/p angiogram with Dr. Stanford Breed on 3/8 and was considered optimized from a vascular standpoint and to consider L BKA. Most of patient's hesitation is b/c he cares for his wife whom has Stage IV colon cancer and has been declining.  He states he does not know how he would continue caring for her if he underwent surgery and there are no other caretakers for her -Needs vascular surgery consult in morning to help continue facilitating discussions -Continue Rocephin.  Add Flagyl  Chronic osteomyelitis ([place]) - continue rocephin and flagyl for now - if  continues to decline surgery, may need ID input but doubt good candidate for long term abx as well  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 (HCC) - s/p angiogram with Dr. Stanford Breed on 3/8; recommendation was to consider L BKA; patient very hesitant still, mostly seems to be centered around not knowing who would care for his wife - continue aspirin and statin for now - needs vascular surgery involvement to help patient decide further  HTN (hypertension) - Continue losartan and propranolol  DM2 (diabetes mellitus, type 2) ([place]) - check A1c - continue SSI and CBG monitoring    Code Status:     Code Status: Full Code  DVT Prophylaxis:   enoxaparin (LOVENOX) injection 40 mg Start: 02/22/23 1000   Anticipated disposition is to: Pending clinical course  History: Past Medical History:  Diagnosis Date   Diabetic foot ulcers ([place])    DM2 (diabetes mellitus, type 2) ([place])    HTN (hypertension)    Sleep apnea    No Cpap    Past Surgical History:  Procedure Laterality Date   ABDOMINAL AORTOGRAM W/LOWER EXTREMITY Left 02/18/2023   Procedure: ABDOMINAL AORTOGRAM W/LOWER EXTREMITY;  Surgeon: Cherre Robins, MD;  Location: [facility];  Service: Cardiovascular;  Laterality: Left;   BACK SURGERY     Laminectomy-Bilateral   EYE SURGERY     Bilateral cataracts and eye implants   I & D EXTREMITY Left 09/21/2019   Procedure: LEFT PARTIAL CALCANEOUS EXCISION;  Surgeon: [name], MD;  Location: [facility];  Service: Orthopedics;  Laterality: Left;   KNEE ARTHROSCOPY Left    SHOULDER ARTHROSCOPY     Right     reports that he has never smoked. He has never used smokeless tobacco. He reports current alcohol use. He reports that he does not use drugs.  Allergies  Allergen Reactions   Bee Venom Anaphylaxis, Swelling and Rash         Cefepime Rash    Unclear if due to cefepime    No family history on file.  Home Medications: Prior to  Admission medications   Medication Sig Start Date End Date Taking? Authorizing Provider  aspirin EC 81 MG tablet Take 81 mg by mouth daily.     [provider]  atorvastatin (LIPITOR) 10 MG tablet Take 10 mg by mouth at bedtime.    [provider]  B-D UF III MINI PEN NEEDLES 31G X 5 MM MISC USE AS DIRECTED UP TO 3 TIMES DAILY 02/28/19   [provider]  Cholecalciferol (VITAMIN D3) 50 MCG (2000 UT) CAPS Take 2,000 Units by mouth daily.    [provider]  Continuous Blood Gluc Sensor (FREESTYLE LIBRE 14 DAY SENSOR) MISC Use to check blood glucose 01/22/19   [provider]  cyanocobalamin (VITAMIN B12) 1000 MCG tablet Take 1,000 mcg by mouth daily.    [provider]  EPINEPHrine (EPIPEN 2-PAK) 0.3 mg/0.3 mL IJ SOAJ injection Inject 0.3 mg into the muscle as needed for anaphylaxis.     [provider]  ferrous sulfate 325 (65 FE) MG tablet Take 325 mg by mouth daily with breakfast.    [provider]  furosemide (LASIX) 40 MG tablet Take 40 mg by mouth daily.     [provider]  losartan (COZAAR) 100 MG tablet Take 100 mg by mouth daily.    [provider]  NOVOLOG FLEXPEN 100 UNIT/ML FlexPen Inject 5-11 Units into the skin 3 (three) times daily with meals. Sliding scale 02/15/19   [provider]  potassium chloride (K-DUR) 10 MEQ tablet Take 10 mEq by mouth daily. 04/12/18   [provider]  propranolol (INDERAL) 20 MG tablet Take 20 mg by mouth 2 (two) times daily. 12/24/22   [provider]  TRESIBA FLEXTOUCH 200 UNIT/ML SOPN Inject 50-85 Units into the skin See admin instructions. Take 85 units in the morning and 50 units at bedtime 09/04/19   [provider]    Review of Systems:  Review of Systems  Constitutional:  Positive for fever.  HENT: Negative.    Eyes: Negative.   Respiratory: Negative.    Cardiovascular: Negative.   Gastrointestinal: Negative.    Genitourinary: Negative.   Musculoskeletal: Negative.   Skin: Negative.   Neurological: Negative.   Endo/Heme/Allergies: Negative.   Psychiatric/Behavioral: Negative.      Physical Exam:  Vitals:   02/21/23 1912 02/21/23 2300 02/21/23 2339 02/22/23 0007  BP:   (!) 159/78 138/66  Pulse:    89  Resp:    17  Temp:  99.1 F (37.3 C)  98.8 F (37.1 C)  TempSrc:  Oral  Oral  SpO2:    98%  Weight: 111.6 kg   111 kg  Height:    '6\' 2"'$  (1.88 m)   Physical Exam Constitutional:      General: He is not in acute distress.    Appearance: Normal appearance.  HENT:     Head: Normocephalic and atraumatic.     Mouth/Throat:     Mouth: Mucous membranes  are moist.  Eyes:     Extraocular Movements: Extraocular movements intact.  Cardiovascular:     Rate and Rhythm: Normal rate and regular rhythm.  Pulmonary:     Effort: Pulmonary effort is normal.     Breath sounds: Normal breath sounds.  Abdominal:     General: Bowel sounds are normal.     Palpations: Abdomen is soft.  Musculoskeletal:     Cervical back: Normal range of motion and neck supple.     Comments: Deep ulcerated wound noted L plantar surface; see pic below  Skin:    General: Skin is warm and dry.  Neurological:     General: No focal deficit present.     Mental Status: He is alert.  Psychiatric:        Mood and Affect: Mood normal.      Labs on Admission:  I have personally reviewed following labs and imaging studies Results for orders placed or performed during the hospital encounter of 02/21/23 (from the past 24 hour(s))  Comprehensive metabolic panel     Status: Abnormal   Collection Time: 02/21/23  8:30 PM  Result Value Ref Range   Sodium 132 (L) 135 - 145 mmol/L   Potassium 3.7 3.5 - 5.1 mmol/L   Chloride 101 98 - 111 mmol/L   CO2 25 22 - 32 mmol/L   Glucose, Bld 137 (H) 70 - 99 mg/dL   BUN 27 (H) 8 - 23 mg/dL   Creatinine, Ser 0.96 0.61 - 1.24 mg/dL   Calcium 9.0 8.9 - 10.3 mg/dL   Total Protein 8.1 6.5  - 8.1 g/dL   Albumin 3.6 3.5 - 5.0 g/dL   AST 13 (L) 15 - 41 U/L   ALT 10 0 - 44 U/L   Alkaline Phosphatase 81 38 - 126 U/L   Total Bilirubin 1.8 (H) 0.3 - 1.2 mg/dL   GFR, Estimated >60 >60 mL/min   Anion gap 6 5 - 15  CBC with Differential     Status: Abnormal   Collection Time: 02/21/23  8:30 PM  Result Value Ref Range   WBC 8.3 4.0 - 10.5 K/uL   RBC 4.16 (L) 4.22 - 5.81 MIL/uL   Hemoglobin 11.5 (L) 13.0 - 17.0 g/dL   HCT 34.3 (L) 39.0 - 52.0 %   MCV 82.5 80.0 - 100.0 fL   MCH 27.6 26.0 - 34.0 pg   MCHC 33.5 30.0 - 36.0 g/dL   RDW 13.3 11.5 - 15.5 %   Platelets 220 150 - 400 K/uL   nRBC 0.0 0.0 - 0.2 %   Neutrophils Relative % 84 %   Neutro Abs 7.0 1.7 - 7.7 K/uL   Lymphocytes Relative 7 %   Lymphs Abs 0.6 (L) 0.7 - 4.0 K/uL   Monocytes Relative 7 %   Monocytes Absolute 0.6 0.1 - 1.0 K/uL   Eosinophils Relative 1 %   Eosinophils Absolute 0.1 0.0 - 0.5 K/uL   Basophils Relative 0 %   Basophils Absolute 0.0 0.0 - 0.1 K/uL   Immature Granulocytes 1 %   Abs Immature Granulocytes 0.05 0.00 - 0.07 K/uL  Sedimentation rate     Status: Abnormal   Collection Time: 02/21/23  8:30 PM  Result Value Ref Range   Sed Rate 86 (H) 0 - 16 mm/hr  C-reactive protein     Status: Abnormal   Collection Time: 02/21/23  8:30 PM  Result Value Ref Range   CRP 14.5 (H) <1.0 mg/dL  Glucose, capillary  Status: Abnormal   Collection Time: 02/22/23  1:48 AM  Result Value Ref Range   Glucose-Capillary 64 (L) 70 - 99 mg/dL     Radiological Exams on Admission: DG Foot 2 Views Left  Result Date: 02/21/2023 CLINICAL DATA:  Foot infection EXAM: LEFT FOOT - 2 VIEW COMPARISON:  08/06/2019, MRI 08/19/2022 FINDINGS: Large plantar heel ulcer extending to the surface of the calcaneus bone. Bony remodeling of calcaneus with heterogeneous sclerosis. 2.2 cm focal lucency in the calcaneus bone probably corresponding to intra osseous abscess on prior MRI. Vascular calcifications. Old appearing deformity of  the first proximal phalanx. Moderate degenerative change at the first MTP joint. IMPRESSION: Large plantar heel ulcer extending to the surface of the calcaneus bone with bony remodeling and heterogeneous sclerosis of the calcaneus likely due to chronic osteomyelitis. 2.2 cm focal lucency in the calcaneus bone probably corresponding to intra osseous abscess on prior MRI. Electronically Signed   By: Donavan Foil M.D.   On: 02/21/2023 21:39   DG Foot 2 Views Left  Final Result       Dwyane Dee, MD [facility] 02/22/2023, 1:59 AM

## 2023-02-22 NOTE — Consult Note (Signed)
Hospital Consult   Reason for Consult: Left foot ulcer  Referring Physician: Dr. Alfredia Ferguson MRN #:  [id number]  History of Present Illness: This is a 67 y.o. male with chronic osteomyelitis of his left heel has been followed with the wound care center and has previous debridement.  This has been an ongoing process for approximately 3 and half years.  He has recently undergone angiography which demonstrated inline flow via the posterior tibial artery filling the heel and the plantar vessels.  He now was readmitted to the hospital for persistent infection requiring IV antibiotics.  States that he has had persistent fever and malaise.  Past Medical History:  Diagnosis Date   Diabetic foot ulcers (HCC)    DM2 (diabetes mellitus, type 2) (HCC)    HTN (hypertension)    Sleep apnea    No Cpap    Past Surgical History:  Procedure Laterality Date   ABDOMINAL AORTOGRAM W/LOWER EXTREMITY Left 02/18/2023   Procedure: ABDOMINAL AORTOGRAM W/LOWER EXTREMITY;  Surgeon: Cherre Robins, MD;  Location: [facility];  Service: Cardiovascular;  Laterality: Left;   BACK SURGERY     Laminectomy-Bilateral   EYE SURGERY     Bilateral cataracts and eye implants   I & D EXTREMITY Left 09/21/2019   Procedure: LEFT PARTIAL CALCANEOUS EXCISION;  Surgeon: [name], MD;  Location: [facility];  Service: Orthopedics;  Laterality: Left;   KNEE ARTHROSCOPY Left    SHOULDER ARTHROSCOPY     Right    Allergies  Allergen Reactions   Bee Venom Anaphylaxis, Swelling and Rash         Cefepime Rash    Unclear if due to cefepime    Prior to Admission medications   Medication Sig Start Date End Date Taking? Authorizing Provider  aspirin EC 81 MG tablet Take 81 mg by mouth daily.     [provider]  atorvastatin (LIPITOR) 10 MG tablet Take 10 mg by mouth at bedtime.    [provider]  B-D UF III MINI PEN NEEDLES 31G X 5 MM MISC USE AS DIRECTED UP TO 3 TIMES DAILY 02/28/19   [provider]  Cholecalciferol (VITAMIN D3) 50 MCG (2000 UT) CAPS Take 2,000 Units by mouth daily.    [provider]  Continuous Blood Gluc Sensor (FREESTYLE LIBRE 14 DAY SENSOR) MISC Use to check blood glucose 01/22/19   [provider]  cyanocobalamin (VITAMIN B12) 1000 MCG tablet Take 1,000 mcg by mouth daily.    [provider]  EPINEPHrine (EPIPEN 2-PAK) 0.3 mg/0.3 mL IJ SOAJ injection Inject 0.3 mg into the muscle as needed for anaphylaxis.     [provider]  ferrous sulfate 325 (65 FE) MG tablet Take 325 mg by mouth daily with breakfast.    [provider]  furosemide (LASIX) 40 MG tablet Take 40 mg by mouth daily.     [provider]  losartan (COZAAR) 100 MG tablet Take 100 mg by mouth daily.    [provider]  NOVOLOG FLEXPEN 100 UNIT/ML FlexPen Inject 5-11 Units into the skin 3 (three) times daily with meals. Sliding scale 02/15/19   [provider]  potassium chloride (K-DUR) 10 MEQ tablet Take 10 mEq by mouth daily. 04/12/18   [provider]  propranolol (INDERAL) 20 MG tablet Take 20 mg by mouth 2 (two) times daily. 12/24/22   [provider]  TRESIBA FLEXTOUCH 200 UNIT/ML SOPN Inject 50-85 Units into the skin See admin  instructions. Take 85 units in the morning and 50 units at bedtime 09/04/19   [provider]    Social History   Socioeconomic History   Marital status: Married    Spouse name: Not on file   Number of children: Not on file   Years of education: Not on file   Highest education level: Not on file  Occupational History   Not on file  Tobacco Use   Smoking status: Never   Smokeless tobacco: Never  Vaping Use   Vaping Use: Never used  Substance and Sexual Activity   Alcohol use: Yes    Comment: rarely   Drug use: Never   Sexual activity: Not on file  Other Topics Concern   Not on file  Social History Narrative   Not on file   Social Determinants of Health    Financial Resource Strain: Not on file  Food Insecurity: No Food Insecurity (02/22/2023)   Hunger Vital Sign    Worried About Running Out of Food in the Last Year: Never true    Ran Out of Food in the Last Year: Never true  Transportation Needs: No Transportation Needs (02/22/2023)   PRAPARE - Hydrologist (Medical): No    Lack of Transportation (Non-Medical): No  Physical Activity: Not on file  Stress: Not on file  Social Connections: Not on file  Intimate Partner Violence: Not At Risk (02/22/2023)   Humiliation, Afraid, Rape, and Kick questionnaire    Fear of Current or Ex-Partner: No    Emotionally Abused: No    Physically Abused: No    Sexually Abused: No     No family history on file.  Review of Systems  Constitutional:  Positive for chills, fever and malaise/fatigue.  HENT: Negative.    Eyes: Negative.   Respiratory: Negative.    Cardiovascular: Negative.   Gastrointestinal: Negative.   Musculoskeletal:        Heel wound on the left  Skin: Negative.   Neurological: Negative.   Endo/Heme/Allergies: Negative.   Psychiatric/Behavioral: Negative.        Physical Examination  Vitals:   02/22/23 0346 02/22/23 1250  BP: 138/64 139/67  Pulse: 76 81  Resp: 17 17  Temp: 98.7 F (37.1 C) 99.6 F (37.6 C)  SpO2: 100% 99%   Body mass index is 31.42 kg/m.  Physical Exam HENT:     Head: Normocephalic.     Nose: Nose normal.  Eyes:     Pupils: Pupils are equal, round, and reactive to light.  Cardiovascular:     Pulses:          Dorsalis pedis pulses are 2+ on the right side.     Comments: Dressing on the left foot Abdominal:     General: Abdomen is flat.  Musculoskeletal:     Right lower leg: No edema.     Left lower leg: Edema present.  Neurological:     General: No focal deficit present.     Mental Status: He is alert.  Psychiatric:        Mood and Affect: Mood normal.        Thought Content: Thought content normal.         Judgment: Judgment normal.      CBC    Component Value Date/Time   WBC 4.9 02/22/2023 0858   RBC 3.73 (L) 02/22/2023 0858   HGB 10.2 (L) 02/22/2023 0858   HCT 31.4 (L) 02/22/2023 [id number]  PLT 198 02/22/2023 0858   MCV 84.2 02/22/2023 0858   MCH 27.3 02/22/2023 0858   MCHC 32.5 02/22/2023 0858   RDW 13.4 02/22/2023 0858   LYMPHSABS 0.5 (L) 02/22/2023 0858   MONOABS 0.4 02/22/2023 0858   EOSABS 0.1 02/22/2023 0858   BASOSABS 0.0 02/22/2023 0858    BMET    Component Value Date/Time   NA 134 (L) 02/22/2023 0858   K 3.8 02/22/2023 0858   CL 103 02/22/2023 0858   CO2 22 02/22/2023 0858   GLUCOSE 163 (H) 02/22/2023 0858   BUN 20 02/22/2023 0858   CREATININE 1.09 02/22/2023 0858   CALCIUM 8.6 (L) 02/22/2023 0858   GFRNONAA >60 02/22/2023 0858   GFRAA >60 09/21/2019 0753    COAGS: No results found for: "INR", "PROTIME"    Vascular Imaging:   ABI Findings:  +---------+------------------+-----+-----------+--------+  Right   Rt Pressure (mmHg)IndexWaveform   Comment   +---------+------------------+-----+-----------+--------+  Brachial 161                                         +---------+------------------+-----+-----------+--------+  PTA     203               1.26 multiphasic          +---------+------------------+-----+-----------+--------+  DP      160               0.99 multiphasic          +---------+------------------+-----+-----------+--------+  Great Toe115               0.71 Normal               +---------+------------------+-----+-----------+--------+   +---------+------------------+-----+----------+-------+  Left    Lt Pressure (mmHg)IndexWaveform  Comment  +---------+------------------+-----+----------+-------+  Brachial 159                                       +---------+------------------+-----+----------+-------+  PTA     120               0.75 monophasic          +---------+------------------+-----+----------+-------+  DP      129               0.80 monophasic         +---------+------------------+-----+----------+-------+  Great Toe108               0.67 Abnormal           +---------+------------------+-----+----------+-------+   +-------+-----------+-----------+------------+------------+  ABI/TBIToday's ABIToday's TBIPrevious ABIPrevious TBI  +-------+-----------+-----------+------------+------------+  Right 1.26       .71                                  +-------+-----------+-----------+------------+------------+  Left  .80        .67                                  +-------+-----------+-----------+------------+------------+    Summary:  Right: Resting right ankle-brachial index is within normal range. The right toe-brachial index is normal.   Left: Resting left ankle-brachial index indicates mild to modertae left lower extremity arterial disease the waveforms are monophasic consistent  with more severe PAD. The left toe-brachial index is abnormal.    Recent angio reviewed  ASSESSMENT/PLAN: 67 y.o. male known to our service with chronic left heel ulceration likely secondary to diabetes.  ABIs were monophasic and mildly decreased on the left relative to the right however angiogram demonstrates no significant stenoses proximally with large posterior tibial runoff to the foot suggesting no intervention would improve healing potential of the wound and BKA has been recommended.  Patient with unfortunate social situation with wife reportedly having stage IV colon cancer but refusing any treatment and refusing any in-home or out of home care and requiring Mr. Edwardson himself to be the primary caregiver.  I discussed with him the expectations of amputation including 2 to 4 weeks of rehab post procedure and at least 3 months of healing prior to obtaining even temporary prosthesis.  At this time patient does not think his social  situation will allow such a procedure and we did discuss that this could be a life-threatening problem for him and he demonstrates very good understanding.  He would like to keep his follow-up appointment with Dr. Stanford Breed to get a few things taken care of at home I am unsure that this will be possible but currently no need for transfer to [facility] as he is not amenable to amputation.  Please contact us again if situation changes he will otherwise keep his scheduled follow-up.  Latania Bascomb C. Donzetta Matters, MD [facility] Office: [phone] Pager: [phone]

## 2023-02-22 NOTE — Assessment & Plan Note (Signed)
-  Patient with uncontrolled DM, neuropathy, and chronic non-healing wound that he been followed by wound care, has chronic osteomyeltitis -s/p angiogram with Dr. Stanford Breed on 3/8 and was considered optimized from a vascular standpoint and to consider L BKA.  -Most of patient's hesitation is b/c he cares for his wife whom has Stage IV colon cancer and has been declining.  He states he does not know how he would continue caring for her if he underwent surgery and there are no other caretakers for her -Vascular surgery has consulted and continues to recommend BKA - which the patient declines at this time, knowing that this means a risk for significant infection and death -He was treated with Rocephin and Flagyl -Per pharmacy recommendation, will transition to PO Augmentin 875 mg BID and PO Doxycyline 100 mg BID x 6 weeks -He already has outpatient vascular follow up scheduled -Patient encouraged to strongly consider amputation as recommended

## 2023-02-22 NOTE — Consult Note (Signed)
[facility]                                                                                        Patient Identification: Patient Name: [name] MRN: [id number] [place] Date: 02/21/2023  7:01 PM Today's Date: 02/22/2023 Reason for consult: chronic heel osteomyelitis  Requesting provider: DR Alfredia Ferguson  Principal Problem:   Diabetic foot ulcer ([place]) Active Problems:   DM2 (diabetes mellitus, type 2) ([place])   HTN (hypertension)   Diabetic peripheral neuropathy ([place])   Chronic osteomyelitis ([place])   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 ([place])   Antibiotics:  Ceftriaxone 3/11-c Metronidazole 3/11-c Vancomycin 3/12-c  Lines/Hardware: No known   Assessment 67 year old male with PMH as below including DM 2, HTN, OSA who presented to the ED on 3/11 with acute on chronic left foot DFU with worsening over the last few days associated with fevers and chills at home.   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   OK to switch to Doxycycline and augmentin to complete 6 weeks course as adjunctive tx since he is declining surgery. I have  discussed with him the chances of cure is very minimal; with abtx alone.   He agreed to fu with Vascular surgery OP to discuss definitive tx moving forwards.  ID will so, please call with questions   Rest of the management as per the  primary team. Please call with questions or concerns.  Thank you for the consult  Rosiland Oz, MD Infectious Disease Physician [name] [address] [place], [place] [address] Phone: [phone]  Fax: [phone]  __________________________________________________________________________________________________________ HPI and Hospital Course: 67 year old male with PMH as below including DM 2, HTN, OSA who presented to the ED on 3/11 with acute on chronic left foot DFU with worsening over the last few days associated with fevers and chills at home.  Denies nausea, vomiting or diarrhea.  Denies cough, chest pain or shortness of breath.  He is status lower extremity angiogram by vascular surgery on 3/8 and had second order cannulation of the left lower extremity, also with occluded right anterior tibial artery and was optimized from a vascular standpoint.  A BKA was advised at that point. He follows wound care for his chronic left heel ulcer.   At ED, Tmax 100.6 Labs remarkable for CRP 14.5, ESR 86 , BBC 8.3, A1c 9.1  Imaging as below  Orthopedics was consulted from ED and requested transfer to [facility], however patient refused BKA and declined transfer to [facility]. Seen by Vascular  surgery and patient has declined BKA at this time.   ROS: General- Denies loss of appetite and loss of weight HEENT - Denies headache, blurry vision, neck pain, sinus pain Chest - Denies any chest pain, SOB or cough CVS- Denies any dizziness/lightheadedness, syncopal attacks, palpitations Abdomen- Denies any nausea, vomiting, abdominal pain, hematochezia and diarrhea Neuro - Denies any weakness, numbness, tingling sensation Psych - Denies any changes in mood irritability or depressive symptoms GU- Denies any burning, dysuria, hematuria or increased frequency of urination Skin - denies any rashes/lesions MSK - denies any joint pain/swelling or restricted  ROM   Past Medical History:  Diagnosis Date   Diabetic foot ulcers (HCC)    DM2 (diabetes mellitus, type 2) (HCC)    HTN (hypertension)    Sleep apnea    No Cpap    Past Surgical History:  Procedure Laterality Date   ABDOMINAL AORTOGRAM W/LOWER EXTREMITY Left 02/18/2023   Procedure: ABDOMINAL AORTOGRAM W/LOWER EXTREMITY;  Surgeon: Cherre Robins, MD;  Location: [facility];  Service: Cardiovascular;  Laterality: Left;   BACK SURGERY     Laminectomy-Bilateral   EYE SURGERY     Bilateral cataracts and eye implants   I & D EXTREMITY Left 09/21/2019   Procedure: LEFT PARTIAL CALCANEOUS EXCISION;  Surgeon: [name], MD;  Location: [facility];  Service: Orthopedics;  Laterality: Left;   KNEE ARTHROSCOPY Left    SHOULDER ARTHROSCOPY     Right    Scheduled Meds:  aspirin EC  81 mg Oral Daily   atorvastatin  80 mg Oral Daily   enoxaparin (LOVENOX) injection  40 mg Subcutaneous Daily   insulin aspart  0-15 Units Subcutaneous TID WC   insulin aspart  0-5 Units Subcutaneous QHS   insulin glargine-yfgn  10 Units Subcutaneous BID   losartan  100 mg Oral Daily   potassium & sodium phosphates  1 packet Oral TID WC & HS   propranolol  20 mg Oral BID   sodium chloride flush  3 mL Intravenous Q12H   Continuous Infusions:  cefTRIAXone (ROCEPHIN)  IV     metronidazole 500 mg (02/22/23 1234)   PRN Meds:.acetaminophen **OR** acetaminophen, oxyCODONE  Allergies  Allergen Reactions   Bee Venom Anaphylaxis, Swelling and Rash         Cefepime Rash    Unclear if due to cefepime   Social History   Socioeconomic History   Marital status: Married    Spouse name: Not on file   Number of children: Not on file   Years of education: Not on file   Highest education level: Not on file  Occupational History   Not on file  Tobacco Use   Smoking status: Never   Smokeless tobacco: Never  Vaping Use   Vaping Use: Never used  Substance and Sexual Activity   Alcohol use: Yes     Comment: rarely   Drug use: Never   Sexual activity: Not on file  Other Topics Concern   Not on file  Social History Narrative   Not on file   Social Determinants of Health   Financial Resource Strain: Not on file  Food Insecurity: No Food Insecurity (02/22/2023)   Hunger Vital Sign    Worried About Running Out of Food in the Last Year: Never true    Ran Out of Food in the Last Year: Never true  Transportation Needs: No Transportation Needs (02/22/2023)   PRAPARE - Hydrologist (Medical):  No    Lack of Transportation (Non-Medical): No  Physical Activity: Not on file  Stress: Not on file  Social Connections: Not on file  Intimate Partner Violence: Not At Risk (02/22/2023)   Humiliation, Afraid, Rape, and Kick questionnaire    Fear of Current or Ex-Partner: No    Emotionally Abused: No    Physically Abused: No    Sexually Abused: No   No family history on file.  Vitals BP (!) 110/58 (BP Location: Right Arm)   Pulse 72   Temp 98.9 F (37.2 C)   Resp 19   Ht '6\' 2"'$  (1.88 m)   Wt 111 kg   SpO2 97%   BMI 31.42 kg/m   Physical Exam Constitutional: Elderly white male lying in the bed and appears comfortable    Comments:   Cardiovascular:     Rate and Rhythm: Normal rate and regular rhythm.     Heart sounds: S1, S2  Pulmonary:     Effort: Pulmonary effort is normal on room air    Comments: Normal breath sounds  Abdominal:     Palpations: Abdomen is soft.     Tenderness: Nondistended and nontender  Musculoskeletal:        General: No swelling or tenderness in peripheral joints Left heel chronic ulcer with granulation tissue at the base and yellowish discharge,  mild surrounding cellulitis/edema. No crepitus or fluctuance. Left ankle ROM intact    Skin:    Comments: No rashes   Neurological:     General: awake, alert and oriented, grossly non focal  Psychiatric:        Mood and Affect: Mood normal.    Pertinent  Microbiology Results for orders placed or performed during the hospital encounter of 02/21/23  Culture, blood (routine x 2)     Status: None (Preliminary result)   Collection Time: 02/21/23  8:30 PM   Specimen: BLOOD  Result Value Ref Range Status   Specimen Description   Final    BLOOD SITE NOT SPECIFIED Performed at [facility] [address], [place], [place] [address]    Special Requests   Final    BOTTLES DRAWN AEROBIC AND ANAEROBIC Blood Culture adequate volume Performed at [facility] [address], [place], [place] [address]    Culture   Final    NO GROWTH < 12 HOURS Performed at [facility] [address], [place], [place] [address]    Report Status PENDING  Incomplete  Culture, blood (routine x 2)     Status: None (Preliminary result)   Collection Time: 02/22/23 12:36 AM   Specimen: BLOOD  Result Value Ref Range Status   Specimen Description   Final    BLOOD SITE NOT SPECIFIED Performed at [facility] [address], [place], [place] [address]    Special Requests   Final    BOTTLES DRAWN AEROBIC AND ANAEROBIC Blood Culture adequate volume Performed at [facility] [address] [place], [place] [address]    Culture   Final    NO GROWTH < 12 HOURS Performed at [facility] [address] [place], [place] [address]    Report Status PENDING  Incomplete   Pertinent Lab seen by me:    Latest Ref Rng & Units 02/23/2023    4:09 AM 02/22/2023    8:58 AM 02/21/2023    8:30 PM  CBC  WBC 4.0 - 10.5 K/uL 4.2  4.9  8.3   Hemoglobin 13.0 - 17.0 g/dL 9.7  10.2  11.5   Hematocrit 39.0 - 52.0 % 29.7  31.4  34.3   Platelets 150 - 400 K/uL 210  198  220       Latest Ref Rng & Units 02/23/2023    4:09 AM 02/22/2023    8:58 AM 02/21/2023    8:30 PM  CMP  Glucose 70 - 99 mg/dL 100  163  137   BUN 8 - 23 mg/dL '19  20  27   '$ Creatinine 0.61 - 1.24 mg/dL 1.21  1.09  0.96   Sodium  135 - 145 mmol/L 134  134  132   Potassium 3.5 - 5.1 mmol/L 3.9  3.8  3.7   Chloride 98 - 111 mmol/L 104  103  101   CO2 22 - 32 mmol/L '22  22  25   '$ Calcium 8.9 - 10.3 mg/dL 8.5  8.6  9.0   Total Protein 6.5 - 8.1 g/dL 6.6  6.7  8.1   Total Bilirubin 0.3 - 1.2 mg/dL 0.7  1.0  1.8   Alkaline Phos 38 - 126 U/L 60  64  81   AST 15 - 41 U/L '11  11  13   '$ ALT 0 - 44 U/L '12  11  10      '$ Pertinent Imagings/Other Imagings Plain films and CT images have been personally visualized and interpreted; radiology reports have been reviewed. Decision making incorporated into the Impression / Recommendations.  DG Foot 2 Views Left  Result Date: 02/21/2023 CLINICAL DATA:  Foot infection EXAM: LEFT FOOT - 2 VIEW COMPARISON:  08/06/2019, MRI 08/19/2022 FINDINGS: Large plantar heel ulcer extending to the surface of the calcaneus bone. Bony remodeling of calcaneus with heterogeneous sclerosis. 2.2 cm focal lucency in the calcaneus bone probably corresponding to intra osseous abscess on prior MRI. Vascular calcifications. Old appearing deformity of the first proximal phalanx. Moderate degenerative change at the first MTP joint. IMPRESSION: Large plantar heel ulcer extending to the surface of the calcaneus bone with bony remodeling and heterogeneous sclerosis of the calcaneus likely due to chronic osteomyelitis. 2.2 cm focal lucency in the calcaneus bone probably corresponding to intra osseous abscess on prior MRI. Electronically Signed   By: Donavan Foil M.D.   On: 02/21/2023 21:39   PERIPHERAL VASCULAR CATHETERIZATION  Result Date: 02/18/2023 DATE OF SERVICE: 02/18/2023  PATIENT:  [name]  67 y.o. male  PRE-OPERATIVE DIAGNOSIS:  Atherosclerosis of native arteries of left lower extremity causing ulceration  POST-OPERATIVE DIAGNOSIS:  Same  PROCEDURE:  1) Ultrasound guided right common femoral artery access 2) Aortogram 3) Left lower extremity angiogram with second order cannulation 4) Conscious sedation (25  minutes)   SURGEON:  Yevonne Aline. Stanford Breed, MD  ASSISTANT: none  ANESTHESIA:   local and IV sedation  ESTIMATED BLOOD LOSS: minimal  LOCAL MEDICATIONS USED:  LIDOCAINE  COUNTS: confirmed correct.  PATIENT DISPOSITION:  PACU - hemodynamically stable.  Delay start of Pharmacological VTE agent (>24hrs) due to surgical blood loss or risk of bleeding: no  INDICATION FOR PROCEDURE: [name] is a 67 y.o. male with chronic left heel ulceration with osteomyelitis. After careful discussion of risks, benefits, and alternatives the patient was offered angiography. The patient understood and wished to proceed.  OPERATIVE FINDINGS: Terminal aorta and iliac arteries: Widely patent  Right lower extremity: Common femoral artery: Widely patent Profunda femoris artery: Widely patent Superficial femoral artery: Widely patent Popliteal artery: Widely patent  Anterior tibial artery: occluded Tibioperoneal trunk: Widely patent Peroneal artery: Widely patent Posterior tibial artery: Widely patent Pedal circulation: fills via PT  DESCRIPTION OF PROCEDURE: After identification of the patient in the pre-operative holding area, the patient was transferred to the operating room. The patient was positioned supine on the operating room table.  Anesthesia was induced. The groins was prepped and draped in standard fashion. A surgical pause was performed confirming correct patient, procedure, and operative location.  The left groin was anesthetized with subcutaneous injection of 1% lidocaine. Using ultrasound guidance, the left common femoral artery was accessed with micropuncture technique. Fluoroscopy was used to confirm cannulation over the femoral head. The 38F sheath was upsized to 42F.  A Benson wire was advanced into the distal aorta. Over the wire an omni flush catheter was advanced to the level of L2. Aortogram was performed - see above for details.  The right common iliac artery was selected with an omniflush catheter and benson guidewire.  The wire was advanced into the common femoral artery. Over the wire the omni flush catheter was advanced into the external iliac artery. Selective angiography was performed - see above for details.  A mynx device was used to close the arteriotomy. Hemostasis was adequate upon completion.  Conscious sedation was administered with the use of IV fentanyl and midazolam under continuous physician and nurse monitoring.  Heart rate, blood pressure, and oxygen saturation were continuously monitored.  Total sedation time was 25 minutes  Upon completion of the case instrument and sharps counts were confirmed correct. The patient was transferred to the PACU in good condition. I was present for all portions of the procedure.  PLAN: Optimized from a vascular standpoint. Needs ASA '81mg'$  PO QD; high intensity statin therapy. I think BKA is his best option going forward.  Yevonne Aline. Stanford Breed, MD [facility] Phone Number: [phone] 02/18/2023 12:02 PM   VAS [place] LOWER EXTREMITY ARTERIAL DUPLEX  Result Date: 01/24/2023 LOWER EXTREMITY ARTERIAL DUPLEX STUDY Patient Name:  [name]  Date of Exam:   01/24/2023 Medical Rec #: [id number]         Accession #:    [id number] Date of Birth: 01-15-1956         Patient Gender: M Patient Age:   21 years Exam Location:  [facility] Procedure:      VAS [place] LOWER EXTREMITY ARTERIAL DUPLEX Referring Phys: Fredirick Maudlin --------------------------------------------------------------------------------  Indications: PAD (peripheral artery disease) ([place]) [I73.9 (ICD-10-CM)]. High Risk Factors: Hypertension, Diabetes. Other Factors: Ulcer of right foot with fat layer exposed.  Current ABI: 01/24/2023 : Reported mild to moderate left lower extremity arterial              disease. Performing Technologist: Luane School RDCS  Examination Guidelines: A complete evaluation includes B-mode imaging, spectral Doppler, color Doppler, and power Doppler as needed of all  accessible portions of each vessel. Bilateral testing is considered an integral part of a complete examination. Limited examinations for reoccurring indications may be performed as noted.  +----------+--------+-----+---------------+---------+---------------+ RIGHT     PSV cm/sRatioStenosis       Waveform Comments        +----------+--------+-----+---------------+---------+---------------+ EIA [id number]          30-49% stenosistriphasic                +----------+--------+-----+---------------+---------+---------------+ CFA Distal140  triphasiccalcific plaque +----------+--------+-----+---------------+---------+---------------+ DFA       145                         biphasic calcific plaque +----------+--------+-----+---------------+---------+---------------+ SFA Prox  124                         triphasic                +----------+--------+-----+---------------+---------+---------------+ SFA Mid   112                         biphasic                 +----------+--------+-----+---------------+---------+---------------+ SFA Distal69                          triphasic                +----------+--------+-----+---------------+---------+---------------+ POP Prox  84                          triphasic                +----------+--------+-----+---------------+---------+---------------+ ATA Mid   27                          biphasic                 +----------+--------+-----+---------------+---------+---------------+ PTA Mid   88                          triphasic                +----------+--------+-----+---------------+---------+---------------+ PERO Mid  75                          triphasic                +----------+--------+-----+---------------+---------+---------------+ A focal velocity elevation of was obtained at external iliac artery. Findings are characteristic of 30-49% stenosis.   +----------+--------+-----+---------------+----------+--------+ LEFT      PSV cm/sRatioStenosis       Waveform  Comments +----------+--------+-----+---------------+----------+--------+ EIA Distal190          30-49% stenosisbiphasic           +----------+--------+-----+---------------+----------+--------+ CFA Distal161          30-49% stenosisbiphasic           +----------+--------+-----+---------------+----------+--------+ DFA       111                         biphasic           +----------+--------+-----+---------------+----------+--------+ SFA Prox  216          50-74% stenosisbiphasic           +----------+--------+-----+---------------+----------+--------+ SFA Mid   162          30-49% stenosismonophasic         +----------+--------+-----+---------------+----------+--------+ SFA Distal160          30-49% stenosismonophasic         +----------+--------+-----+---------------+----------+--------+ POP Prox  296          50-74% stenosismonophasic         +----------+--------+-----+---------------+----------+--------+ POP [id number]  50-74% stenosismonophasic         +----------+--------+-----+---------------+----------+--------+ ATA Mid   48                          monophasic         +----------+--------+-----+---------------+----------+--------+ PTA Mid   101                         monophasic         +----------+--------+-----+---------------+----------+--------+ PERO Mid  147                         monophasic         +----------+--------+-----+---------------+----------+--------+ A focal velocity elevation of was obtained at external iliac artery. Findings are characteristic of 30-49% stenosis. A 2nd focal velocity elevation was visualized, measuring at proximal superficial femoral artery. Findings are characteristic of 50-74% stenosis. A 3rd focal velocity elevation was visualized, measuring at popliteal artery. Findings  are characteristic of 50-74% stenosis.  Summary: Right: 30-49% stenosis noted in the iliac segment. Diffuse atherosclerosis. Left: 30-49% stenosis noted in the iliac segment. 30-49% stenosis noted in the common femoral artery. 50-74% stenosis noted in the superficial femoral artery. 50-74% stenosis noted in the popliteal artery. He has stenosis in the EIA CFA SFA and popliteal  artery.  See table(s) above for measurements and observations. Electronically signed by Shirlee More MD on 01/24/2023 at 5:40:10 PM.    Final    VAS [place] ABI WITH/WO TBI  Result Date: 01/24/2023  LOWER EXTREMITY DOPPLER STUDY Patient Name:  [name]  Date of Exam:   01/24/2023 Medical Rec #: [id number]         Accession #:    [id number] Date of Birth: 1956-08-16         Patient Gender: M Patient Age:   52 years Exam Location:  [place] Procedure:      VAS [place] ABI WITH/WO TBI Referring Phys: Fredirick Maudlin --------------------------------------------------------------------------------  Indications: PAD (peripheral artery disease) ([place]) [I73.9 (ICD-10-CM)] High Risk Factors: Hypertension, Diabetes. Other Factors: Ulcer of right foot with fat layer exposed.  Comparison Study: No pror exam for comparison. Performing Technologist: Reel, Jimmy RDCS, RVT  Examination Guidelines: A complete evaluation includes at minimum, Doppler waveform signals and systolic blood pressure reading at the level of bilateral brachial, anterior tibial, and posterior tibial arteries, when vessel segments are accessible. Bilateral testing is considered an integral part of a complete examination. Photoelectric Plethysmograph (PPG) waveforms and toe systolic pressure readings are included as required and additional duplex testing as needed. Limited examinations for reoccurring indications may be performed as noted.  ABI Findings: +---------+------------------+-----+-----------+--------+ Right    Rt Pressure (mmHg)IndexWaveform   Comment   +---------+------------------+-----+-----------+--------+ Brachial 161                                        +---------+------------------+-----+-----------+--------+ PTA      203               1.26 multiphasic         +---------+------------------+-----+-----------+--------+ DP       160               0.99 multiphasic         +---------+------------------+-----+-----------+--------+ [facility]  0.71 Normal              +---------+------------------+-----+-----------+--------+ +---------+------------------+-----+----------+-------+ Left     Lt Pressure (mmHg)IndexWaveform  Comment +---------+------------------+-----+----------+-------+ Brachial 159                                      +---------+------------------+-----+----------+-------+ PTA      120               0.75 monophasic        +---------+------------------+-----+----------+-------+ DP       129               0.80 monophasic        +---------+------------------+-----+----------+-------+ Great Toe108               0.67 Abnormal          +---------+------------------+-----+----------+-------+ +-------+-----------+-----------+------------+------------+ ABI/TBIToday's ABIToday's TBIPrevious ABIPrevious TBI +-------+-----------+-----------+------------+------------+ Right  1.26       .71                                 +-------+-----------+-----------+------------+------------+ Left   .80        .67                                 +-------+-----------+-----------+------------+------------+   Summary: Right: Resting right ankle-brachial index is within normal range. The right toe-brachial index is normal. Left: Resting left ankle-brachial index indicates mild to modertae left lower extremity arterial disease the waveforms are monophasic consistent with more severe PAD. The left toe-brachial index is abnormal. Borderline moderate decreased ABI. *See table(s) above for  measurements and observations.  Suggest Peripheral Vascular Consult. Electronically signed by Shirlee More MD on 01/24/2023 at 5:19:43 PM.    Final     I spent 85  minutes for this patient encounter including review of prior medical records/discussing diagnostics and treatment plan with the patient/family/coordinate care with primary/other specialits with greater than 50% of time in face to face encounter.   Electronically signed by:   Rosiland Oz, MD Infectious Disease Physician [facility] Pager: [phone]

## 2023-02-22 NOTE — Assessment & Plan Note (Addendum)
-  s/p angiogram with Dr. Lenell Antu on 3/8; recommendation was to consider L BKA; patient very hesitant still, mostly seems to be centered around not knowing who would care for his wife -continue aspirin and statin for now -Vascular surgery has signed off and will see in clinic

## 2023-02-22 NOTE — Plan of Care (Signed)
Patient alert and oriented x 4. Denies pain. Diabetic foot ulcer on left heel, wet to dry dressing with kerlix applied. VS monitored. Safety and fall precautions observed.   Problem: Education: Goal: Understanding of CV disease, CV risk reduction, and recovery process will improve Outcome: Progressing Goal: Individualized Educational Video(s) Outcome: Progressing   Problem: Activity: Goal: Ability to return to baseline activity level will improve Outcome: Progressing   Problem: Cardiovascular: Goal: Ability to achieve and maintain adequate cardiovascular perfusion will improve Outcome: Progressing Goal: Vascular access site(s) Level 0-1 will be maintained Outcome: Progressing   Problem: Health Behavior/Discharge Planning: Goal: Ability to safely manage health-related needs after discharge will improve Outcome: Progressing   Problem: Education: Goal: Knowledge of General Education information will improve Description: Including pain rating scale, medication(s)/side effects and non-pharmacologic comfort measures Outcome: Progressing   Problem: Health Behavior/Discharge Planning: Goal: Ability to manage health-related needs will improve Outcome: Progressing   Problem: Clinical Measurements: Goal: Ability to maintain clinical measurements within normal limits will improve Outcome: Progressing Goal: Will remain free from infection Outcome: Progressing Goal: Diagnostic test results will improve Outcome: Progressing Goal: Respiratory complications will improve Outcome: Progressing Goal: Cardiovascular complication will be avoided Outcome: Progressing   Problem: Activity: Goal: Risk for activity intolerance will decrease Outcome: Progressing   Problem: Nutrition: Goal: Adequate nutrition will be maintained Outcome: Progressing   Problem: Coping: Goal: Level of anxiety will decrease Outcome: Progressing   Problem: Elimination: Goal: Will not experience complications  related to bowel motility Outcome: Progressing Goal: Will not experience complications related to urinary retention Outcome: Progressing   Problem: Pain Managment: Goal: General experience of comfort will improve Outcome: Progressing   Problem: Safety: Goal: Ability to remain free from injury will improve Outcome: Progressing   Problem: Skin Integrity: Goal: Risk for impaired skin integrity will decrease Outcome: Progressing   Problem: Education: Goal: Ability to describe self-care measures that may prevent or decrease complications (Diabetes Survival Skills Education) will improve Outcome: Progressing Goal: Individualized Educational Video(s) Outcome: Progressing   Problem: Coping: Goal: Ability to adjust to condition or change in health will improve Outcome: Progressing   Problem: Fluid Volume: Goal: Ability to maintain a balanced intake and output will improve Outcome: Progressing   Problem: Health Behavior/Discharge Planning: Goal: Ability to identify and utilize available resources and services will improve Outcome: Progressing Goal: Ability to manage health-related needs will improve Outcome: Progressing   Problem: Metabolic: Goal: Ability to maintain appropriate glucose levels will improve Outcome: Progressing   Problem: Nutritional: Goal: Maintenance of adequate nutrition will improve Outcome: Progressing Goal: Progress toward achieving an optimal weight will improve Outcome: Progressing   Problem: Skin Integrity: Goal: Risk for impaired skin integrity will decrease Outcome: Progressing   Problem: Tissue Perfusion: Goal: Adequacy of tissue perfusion will improve Outcome: Progressing

## 2023-02-22 NOTE — Progress Notes (Addendum)
Care started prior to midnight in the emergency room and patient was admitted early this morning after midnight by Dr. Dwyane Dee and I am in current agreement with his assessment and plan.  Additional changes to the plan of care been made currently.  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  He was recently evaluated by vascular surgery last week and underwent a left lower extremity angiogram was recommended to consider a left BKA per Dr. Berkley Harvey seen.  Patient's left foot x-ray showed a large plantar heel ulcer extending to the surface of the calcaneus bone and follow lucency in the calcaneus bone corresponding to intraosseous abscess on prior MRI from 2020.  Given that he has had this chronic foot ulcer he has declined transfer to [facility] and declined involvement orthopedic surgery.  I spoke to him today and he declined vascular surgery evaluation as well.  He was noted to have a low-grade fever of 100.6 in the ED and was started on antibiotics for further evaluation.  Given the patient does not want surgical intervention he has opted for medical management and so the infectious disease team has been consulted for further antibiotic recommendations given his ongoing left foot ulcer.  Currently he is being admitted and treated for the following but not limited to:  * Diabetic foot ulcer ([place]) - chronic minimal to non-healing wound; follows outpt with wound center too - s/p angiogram with Dr. Stanford Breed on 3/8 and was considered optimized from a vascular standpoint and to consider L BKA. Most of patient's hesitation is b/c he cares for his wife whom has Stage IV colon cancer and has been declining.  He states he does not know how he  would continue caring for her if he underwent surgery and there are no other caretakers for her -Initially considered obtaining vascular surgery consult this morning to help continue facilitating discussions however patient has declined this and feels that he has "good blood flow" -IV antibiotics with Rocephin and Flagyl were continued and wound care consulted -Infectious diseases been consulted for further management and evaluation and they feel that he is not going to heal with just antibiotics and recommending reengaging vascular   Chronic osteomyelitis ([place]) -Continue rocephin and flagyl for now -CRP went from 14.5 and is now 16.4 -Patient did spike a temperature earlier -Since he continues to currently decline surgery we will get infectious diseases further evaluation and management   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 Stanford Breed on 3/8; recommendation was to consider L BKA; patient very hesitant still, mostly seems to be centered around not knowing who would care for his wife -Continue aspirin and statin for now -Patient states that he does not feel the need to see vascular however after discussion with ID we will get them to come by and offer their opinion   HTN (hypertension) -Continue losartan and propranolol -Continue to monitor blood pressure per protocol -Last blood pressure reading was 139/67  Hyponatremia -Na+ Trend: Recent Labs  Lab 02/18/23 0944 02/19/23 0131 02/21/23 2030 02/22/23 0858  NA 137 136 132* 134*  -Replete with po Phos-NAK x 1 day -Continue monitor and trend and repeat CMP in the a.m.  Hypophosphatemia -  Patient's Phos Level is 2.4 -Replete with po Phos-NAK as above -Continue to Monitor and Replete as Necessary  -Repeat Phos Level in the AM   Normocytic Anemia -Hgb/Hct Trend: Recent Labs  Lab 02/18/23 0944 02/18/23 0944 02/21/23 2030 02/22/23 0858  HGB 11.2*  --   11.5* 10.2*  HCT 33.0*  --  34.3* 31.4*  MCV  --    < > 82.5 84.2   < > = values in this interval not displayed.  -Check Anemia Panel in the AM -Continue to Monitor for S/Sx of Bleeding; No overt bleeding noted -Repeat CBC in the AM   DM2 (diabetes mellitus, type 2) (HCC) -Check A1c -Continue SSI and CBG monitoring and add Semglee 10 units BID -Diabetes Education Coordinator Consulted for further evaluation and reccs  Hypoalbuminemia -Patient's Albumin Trend: Recent Labs  Lab 02/19/23 0131 02/21/23 2030 02/22/23 0858  ALBUMIN 2.7* 3.6 3.3*  -Continue to Monitor and Trend and repeat CMP in the AM   Obesity -Complicates overall prognosis and care -Estimated body mass index is 31.42 kg/m as calculated from the following:   Height as of this encounter: '6\' 2"'$  (1.88 m).   Weight as of this encounter: 111 kg.  -Weight Loss and Dietary Counseling given  Will continue to monitor the patient's clinical response to intervention and we have consulted infectious diseases and vascular surgery for further evaluation and management and follow-up on the recommendations.

## 2023-02-22 NOTE — Progress Notes (Signed)
[name], WHARY R ([id number]) [id number] Page 1 of 2 Visit Report for 02/21/2023 Problem List Details Patient Name: Date of Service: [place], [name] 02/21/2023 8:30 A M Medical Record Number: [id number] Patient Account Number: [id number] Date of Birth/Sex: Treating RN: [birth year] (67 y.o. [name], Meta.Reding Primary Care Provider: Nelda Bucks Other Clinician: Valeria Batman Referring Provider: Treating Provider/Extender: Doristine Bosworth in Treatment: 23 Active Problems ICD-10 Encounter Code Description Active Date MDM Diagnosis [phone] Non-pressure chronic ulcer of left heel and midfoot with 09/10/2022 No Yes necrosis of bone L97.523 Non-pressure chronic ulcer of other part of left foot with 09/10/2022 No Yes necrosis of 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, left ankle and foot 09/10/2022 No Yes E11.65 Type 2 diabetes mellitus with hyperglycemia 09/10/2022 No Yes E11.621 Type 2 diabetes mellitus with foot ulcer 09/10/2022 No Yes Inactive Problems ICD-10 Code Description Active Date Inactive Date L72.3 Sebaceous cyst 12/17/2022 12/17/2022 Resolved Problems ICD-10 Code Description Active Date Resolved Date L98.492 Non-pressure chronic ulcer of skin of other sites with fat layer 12/17/2022 01/03/2023 exposed Electronic Signature(s) Signed: 02/21/2023 12:34:06 PM By: Valeria Batman EMT Signed: 02/22/2023 7:40:18 AM By: Fredirick Maudlin MD FACS Entered By: Valeria Batman on 02/21/2023 12:34:05 -------------------------------------------------------------------------------- SuperBill Details Patient Name: Date of Service: [place], [name] 02/21/2023 Medical Record Number: [id number] Patient Account Number: [id number] Date of Birth/Sex: Treating RN: 11-12-1956 (67 y.o. [name] Primary Care Provider: Nelda Bucks Other Clinician: Valeria Batman Referring Provider: Treating Provider/Extender: Johnnette Gourd [place], [place] ([id number]) [phone].pdf Page 2 of 2 Weeks in Treatment: 23 Diagnosis Coding ICD-10 Codes Code Description 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, left ankle and foot E11.65 Type 2 diabetes mellitus with hyperglycemia E11.621 Type 2 diabetes mellitus with foot ulcer Facility Procedures : The patient participates with Medicare or their insurance follows the Medicare Facility Guidelines: CPT4 Code Description Modifier Quantity [id number]-(Facility Use Only) HBOT full body chamber, 62mn , 4 ICD-10 Diagnosis Description [phone]  chronic osteomyelitis, 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 2 diabetes mellitus with foot  ulcer Physician Procedures : CPT4 Code Description Modifier [id number] PHYS HYPERBARIC OXYGEN THERAPY ICD-10 Diagnosis Description 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, left ankle and foot L97.424 Non-pressure chronic ulcer of left heel and midfoot with necrosis of bo  L97.523 Non-pressure chronic ulcer of other part of left foot with necrosis of E11.621 Type 2 diabetes mellitus with foot ulcer Quantity: 1 ne muscle Electronic Signature(s) Signed: 02/21/2023 12:33:55 PM By: GValeria BatmanEMT Signed: 02/22/2023 7:40:18 AM By: CFredirick MaudlinMD FACS Entered By: GValeria Batmanon 02/21/2023 12:33:54

## 2023-02-22 NOTE — Assessment & Plan Note (Signed)
-   continue rocephin and flagyl for now - if continues to decline surgery, may need ID input but doubt good candidate for long term abx as well

## 2023-02-22 NOTE — Assessment & Plan Note (Signed)
-  Continue losartan and propranolol

## 2023-02-22 NOTE — Consult Note (Addendum)
[facility] Nurse Consult Note: Reason for Consult: Consult requested for left heel. Performed remotely after review of progress notes and photos in the EMR.  Pt has been followed by the vascular team previously for revascularization and their team had recommended a BKA be performed. According to X-ray results, "Large plantar heel ulcer extending to the surface of the calcaneus bone with bony remodeling and heterogeneous sclerosis of the calcaneus likely due to chronic osteomyelitis." Pt has declined this surgery and our team has been requested to provide topical treatment recommendations.  Wound type: Left plantar heel with chronic full thickness wound; 50% red, 50% yellow, dry white callous surrounding the wound edges.  Topical treatment orders provided for bedside nurses to perform as follows to absorb drainage and provide antimicrobial benefits: Apply Aquacel Kellie Simmering # [phone]) to left heel wound Q day, then cover with ABD pad and kerlex.  Moisten with NS each time to assist with removal. Please refer to vascular team for further plan of care. Please re-consult if further assistance is needed.  Thank-you,  Julien Girt MSN, [place], [place], [place], [place]

## 2023-02-23 ENCOUNTER — Other Ambulatory Visit (HOSPITAL_COMMUNITY): Payer: Self-pay

## 2023-02-23 DIAGNOSIS — L97426 Non-pressure chronic ulcer of left heel and midfoot with bone involvement without evidence of necrosis: Secondary | ICD-10-CM | POA: Diagnosis not present

## 2023-02-23 DIAGNOSIS — E1169 Type 2 diabetes mellitus with other specified complication: Secondary | ICD-10-CM | POA: Diagnosis not present

## 2023-02-23 DIAGNOSIS — M8660 Other chronic osteomyelitis, unspecified site: Secondary | ICD-10-CM | POA: Diagnosis not present

## 2023-02-23 DIAGNOSIS — Z79899 Other long term (current) drug therapy: Secondary | ICD-10-CM | POA: Insufficient documentation

## 2023-02-23 DIAGNOSIS — E11621 Type 2 diabetes mellitus with foot ulcer: Secondary | ICD-10-CM | POA: Diagnosis not present

## 2023-02-23 LAB — COMPREHENSIVE METABOLIC PANEL
ALT: 12 U/L (ref 0–44)
AST: 11 U/L — ABNORMAL LOW (ref 15–41)
Albumin: 2.9 g/dL — ABNORMAL LOW (ref 3.5–5.0)
Alkaline Phosphatase: 60 U/L (ref 38–126)
Anion gap: 8 (ref 5–15)
BUN: 19 mg/dL (ref 8–23)
CO2: 22 mmol/L (ref 22–32)
Calcium: 8.5 mg/dL — ABNORMAL LOW (ref 8.9–10.3)
Chloride: 104 mmol/L (ref 98–111)
Creatinine, Ser: 1.21 mg/dL (ref 0.61–1.24)
GFR, Estimated: >60 mL/min (ref >60)
Glucose, Bld: 100 mg/dL — ABNORMAL HIGH (ref 70–99)
Potassium: 3.9 mmol/L (ref 3.5–5.1)
Sodium: 134 mmol/L — ABNORMAL LOW (ref 135–145)
Total Bilirubin: 0.7 mg/dL (ref 0.3–1.2)
Total Protein: 6.6 g/dL (ref 6.5–8.1)

## 2023-02-23 LAB — CBC WITH DIFFERENTIAL/PLATELET
Abs Immature Granulocytes: 0.03 10*3/uL (ref 0.00–0.07)
Basophils Absolute: 0 10*3/uL (ref 0.0–0.1)
Basophils Relative: 1 %
Eosinophils Absolute: 0.1 10*3/uL (ref 0.0–0.5)
Eosinophils Relative: 3 %
HCT: 29.7 % — ABNORMAL LOW (ref 39.0–52.0)
Hemoglobin: 9.7 g/dL — ABNORMAL LOW (ref 13.0–17.0)
Immature Granulocytes: 1 %
Lymphocytes Relative: 20 %
Lymphs Abs: 0.8 10*3/uL (ref 0.7–4.0)
MCH: 27.7 pg (ref 26.0–34.0)
MCHC: 32.7 g/dL (ref 30.0–36.0)
MCV: 84.9 fL (ref 80.0–100.0)
Monocytes Absolute: 0.4 10*3/uL (ref 0.1–1.0)
Monocytes Relative: 10 %
Neutro Abs: 2.7 10*3/uL (ref 1.7–7.7)
Neutrophils Relative %: 65 %
Platelets: 210 10*3/uL (ref 150–400)
RBC: 3.5 MIL/uL — ABNORMAL LOW (ref 4.22–5.81)
RDW: 13.2 % (ref 11.5–15.5)
WBC: 4.2 10*3/uL (ref 4.0–10.5)
nRBC: 0 % (ref 0.0–0.2)

## 2023-02-23 LAB — HEMOGLOBIN A1C
Hgb A1c MFr Bld: 9.1 % — ABNORMAL HIGH (ref 4.8–5.6)
Mean Plasma Glucose: 214 mg/dL

## 2023-02-23 LAB — GLUCOSE, CAPILLARY
Glucose-Capillary: 144 mg/dL — ABNORMAL HIGH (ref 70–99)
Glucose-Capillary: 94 mg/dL (ref 70–99)
Glucose-Capillary: 171 mg/dL — ABNORMAL HIGH (ref 70–99)

## 2023-02-23 LAB — MAGNESIUM: Magnesium: 1.8 mg/dL (ref 1.7–2.4)

## 2023-02-23 LAB — PHOSPHORUS: Phosphorus: 2.9 mg/dL (ref 2.5–4.6)

## 2023-02-23 MED ORDER — OXYCODONE HCL 5 MG PO TABS
5.0000 mg | ORAL_TABLET | ORAL | 0 refills | Status: AC | PRN
  Filled 2023-02-23: qty 30, 5d supply, fill #0

## 2023-02-23 MED ORDER — DOXYCYCLINE HYCLATE 100 MG PO CAPS
100.0000 mg | ORAL_CAPSULE | Freq: Two times a day (BID) | ORAL | 0 refills | Status: AC
  Filled 2023-02-23: qty 80, 40d supply, fill #0

## 2023-02-23 MED ORDER — VANCOMYCIN HCL 750 MG/150ML IV SOLN: 750.0000 mg | Freq: Two times a day (BID) | INTRAVENOUS | Status: DC

## 2023-02-23 MED ORDER — AMOXICILLIN-POT CLAVULANATE 875-125 MG PO TABS
1.0000 | ORAL_TABLET | Freq: Two times a day (BID) | ORAL | 0 refills | Status: AC
  Filled 2023-02-23: qty 80, 40d supply, fill #0

## 2023-02-23 NOTE — Plan of Care (Signed)
  Problem: Education: Goal: Understanding of CV disease, CV risk reduction, and recovery process will improve Outcome: Progressing Goal: Individualized Educational Video(s) Outcome: Progressing   Problem: Activity: Goal: Ability to return to baseline activity level will improve Outcome: Progressing   Problem: Cardiovascular: Goal: Ability to achieve and maintain adequate cardiovascular perfusion will improve Outcome: Progressing Goal: Vascular access site(s) Level 0-1 will be maintained Outcome: Progressing   Problem: Health Behavior/Discharge Planning: Goal: Ability to safely manage health-related needs after discharge will improve Outcome: Progressing   Problem: Education: Goal: Knowledge of General Education information will improve Description: Including pain rating scale, medication(s)/side effects and non-pharmacologic comfort measures Outcome: Progressing   Problem: Health Behavior/Discharge Planning: Goal: Ability to manage health-related needs will improve Outcome: Progressing   Problem: Clinical Measurements: Goal: Ability to maintain clinical measurements within normal limits will improve Outcome: Progressing Goal: Will remain free from infection Outcome: Progressing Goal: Diagnostic test results will improve Outcome: Progressing Goal: Respiratory complications will improve Outcome: Progressing Goal: Cardiovascular complication will be avoided Outcome: Progressing   Problem: Activity: Goal: Risk for activity intolerance will decrease Outcome: Progressing   Problem: Nutrition: Goal: Adequate nutrition will be maintained Outcome: Progressing   Problem: Coping: Goal: Level of anxiety will decrease Outcome: Progressing   Problem: Elimination: Goal: Will not experience complications related to bowel motility Outcome: Progressing Goal: Will not experience complications related to urinary retention Outcome: Progressing   Problem: Pain Managment: Goal:  General experience of comfort will improve Outcome: Progressing   Problem: Safety: Goal: Ability to remain free from injury will improve Outcome: Progressing   Problem: Skin Integrity: Goal: Risk for impaired skin integrity will decrease Outcome: Progressing   Problem: Education: Goal: Ability to describe self-care measures that may prevent or decrease complications (Diabetes Survival Skills Education) will improve Outcome: Progressing Goal: Individualized Educational Video(s) Outcome: Progressing   Problem: Coping: Goal: Ability to adjust to condition or change in health will improve Outcome: Progressing   Problem: Fluid Volume: Goal: Ability to maintain a balanced intake and output will improve Outcome: Progressing   Problem: Health Behavior/Discharge Planning: Goal: Ability to identify and utilize available resources and services will improve Outcome: Progressing Goal: Ability to manage health-related needs will improve Outcome: Progressing   Problem: Metabolic: Goal: Ability to maintain appropriate glucose levels will improve Outcome: Progressing   Problem: Nutritional: Goal: Maintenance of adequate nutrition will improve Outcome: Progressing Goal: Progress toward achieving an optimal weight will improve Outcome: Progressing   Problem: Skin Integrity: Goal: Risk for impaired skin integrity will decrease Outcome: Progressing   Problem: Tissue Perfusion: Goal: Adequacy of tissue perfusion will improve Outcome: Progressing   

## 2023-02-23 NOTE — TOC Initial Note (Signed)
Transition of Care [facility] - Initial/Assessment Note    Patient Details  Name: [name] MRN: [id number] Date of Birth: 19-May-1956  [facility] CM/SW Contact:    Ninfa [name], RN Phone Number: 02/23/2023, 10:45 AM  Clinical Narrative:                    Transition of Care [facility]) Department has reviewed patient and no TOC needs have been identified at this time. We will continue to monitor patient advancement through Interdisciplinary progressions and if new patient needs arise, please place a consult.     Patient Goals and CMS Choice            Expected Discharge Plan and Services                                              Prior Living Arrangements/Services                       Activities of Daily Living Home Assistive Devices/Equipment: Gilford Rile (specify type) (knee walker) ADL Screening (condition at time of admission) Patient's cognitive ability adequate to safely complete daily activities?: Yes Is the patient deaf or have difficulty hearing?: Yes Does the patient have difficulty seeing, even when wearing glasses/contacts?: No Does the patient have difficulty concentrating, remembering, or making decisions?: No Patient able to express need for assistance with ADLs?: Yes Does the patient have difficulty dressing or bathing?: Yes Independently performs ADLs?: No Does the patient have difficulty walking or climbing stairs?: Yes Weakness of Legs: Left Weakness of Arms/Hands: None  Permission Sought/Granted                  Emotional Assessment              Admission diagnosis:  Diabetic foot ulcer ([place]) [id number], L97.509] Patient Active Problem List   Diagnosis Date Noted   Medication management 02/23/2023   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 ([place]) 02/22/2023   Diabetic foot ulcer ([place]) 02/21/2023   Chronic osteomyelitis ([place]) 02/18/2023   Osteomyelitis  of foot ([place]) 09/21/2019   Acute osteomyelitis of left calcaneus ([place])    Cubital tunnel syndrome 08/29/2018   AKI (acute kidney injury) ([place]) 06/09/2018   DM2 (diabetes mellitus, type 2) ([place]) 06/09/2018   HTN (hypertension) 06/09/2018   Dehydration 06/09/2018   Diabetic peripheral neuropathy ([place]) 09/08/2015   Ulcer of right foot with fat layer exposed ([place]) 09/08/2015   Primary osteoarthritis of left knee 10/23/2014   Tear of medial meniscus of left knee, initial encounter 10/23/2014   PCP:  Nicoletta Dress, MD Pharmacy:   [facility], [place] [address] Phone: [phone] Fax: [facility] [address] [place] [address] Phone: [phone] Fax: [phone]     Social Determinants of Health (SDOH) Social History: SDOH Screenings   Food Insecurity: No Food Insecurity (02/22/2023)  Housing: Low Risk  (02/22/2023)  Transportation Needs: No Transportation Needs (02/22/2023)  Utilities: Not At Risk (02/22/2023)  Tobacco Use: Low Risk  (02/21/2023)   SDOH Interventions:     Readmission Risk Interventions     No data to display

## 2023-02-23 NOTE — H&P (Incomplete)
Introduction: Patient is a 67 year old male who presented to the ED on 3/12 for an ongoing left foot ulcer.   History of present illness: The patient has a PMH significant for OM, T2DM, neuropathy, HTN, and sleep apnea. Patient presented to ED with a fever and ongoing left foot infection. Patient was recently evaluated by vascular surgery and had a LLE angiogram.   Past medical history/allergies/family history pertinent to current illness: Allergy to cefepime causes rash, bee venom causes anaphylaxis   PTA meds: ASA 81 mg PO daily, atorvastatin 10 mg PO at bedtime, vitamin D3 2000u PO daily, vitamin B12 10000 mcg PO daily, ferrous sulfate 325 mg PO daily with breakfast, furosemide 40 mg PO daily, losartan 1000 mg PO daily, novolog 5-11 units TID w/ meals, potassium chloride 10 mEw PO daily, propanolol 20 mg PO BID, Tresiba 50-85 unit  Social hx:  Physical exam/relevant labs, diagnostic testing/imaging: -angiogram 3/9:  - BG 3/13: 100 - gram stain - blood culture (3/11):  no gorwth in 2 days  - blood culture (3/12): no growth in 1 day   - A1c 3/12: 9.1% - BP 3/13: 110/58  - Scr - CBC 3/13: low RBC, Hgb, and HCT - ESR (3/12): 80 (HIGH)  - CRP (3/12): 16.4 (HIGH) - left foot Xray: large plantar heel ulcer extending to surface of calcaneous bone liekly due to chronic osetomyelitis  - peripheral vascular catheterization (3/8): arthrosclerosis of native arties of LLE causing ulceration   Problem based assessment and plan:  - Problem 1: Diabetic Left Foot Ulcer   A. Assessment: chronic non-healing wound complicated by [id number]. Dr. Stanford Breed on 3/8 felt patient was optimized on therapy and to condier amputation. Patient is sole caretaker of wife with cancer, and is concerned about taking time to have surgery. Patient elected not to undergo surgery with understanding that forgeoing surgery could be life threatening   B. Plan: - d/c vanc 750 mg  IV q12 ( recommended vanc 15 mg/kg q12h) -d/c  ceftriaxone (2g q24h) and metronidazole 500 mg q 12h (per cone recommendations)  - will be discharged with oral ABX (Augmentin 875 mg po bid + Doxycycline 100 mg po bid ) - monitor ESR, CRP, WBC, and symptom improvement    - Problem 2: Chronic osteomyelitis   A. Assessment: chronic osteomyelitis with concern for vascular insufficiency (diabetes) + concern for anaerobes. Possible candidate for longer term (minimum 6 week treatment) due to severe, un-resolving nature of OM.    B. Plan:  -continue vanc 750 mg q12h mg/kg q12h - continue ceftriaxone (2g q24h) and metronidazole 500 mg q 12h (per cone recommendations)  - monitor ESR, CRP, WBC, and symptom improvement   - Problem 3: Arthrosclerosis of LLE  A. Assessment:arthrosclerosis of native arties of LLE causing ulceration  B. Plan:  - received recatheterization on 3/8, not much success  - Problem 4: HTN   A. Assessment: chronic HTN on antihypertensive PTA   B. Plan:  - continue losartan and propanolol  - monitor BP   - Problem 5: T2DM   A. Assessment: chronic T2DM, on medication PTA   B. Plan:  - continue SSI and CBG monitoring  - continue semglee (insulin glargine)10 units BID

## 2023-02-23 NOTE — Discharge Summary (Signed)
Physician Discharge Summary   Patient: [name] MRN: [id number] DOB: 1956/07/20  Admit date:     02/21/2023  Discharge date: 02/23/23  Discharge Physician: Karmen Bongo   PCP: Nicoletta Dress, MD   Recommendations at discharge:   Complete antibiotics as prescribed - take both Augmentin and doxycycline twice daily for at least 6 weeks Work on improving diabetes control Come up with a plan to allow for needed amputation, as refusal could lead to severe infection and death Follow up with vascular surgery as scheduled You are being prescribed a limited number of narcotic pain medications - keep them safe from other people and use only as needed.  Do not drive while taking narcotics.  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 He recently has been evaluated by vascular surgery and underwent left lower extremity angiogram.  He has been recommended to consider left BKA.  Left foot x-ray showed large plantar heel ulcer extending to surface of calcaneus bone and focal lucency in calcaneus bone corresponding to intraosseous abscess on prior MRI from 2020.   Patient declined transfer to [facility] and declined involvement of orthopedic surgery. He wished to have evaluation by vascular surgery and continue IV abx for now.  Dr. Donzetta Matters consulted on 3/12 - "no intervention would improve healing potential of the wound and DKA has been recommended."  Based on tragic home situation, he declines BKA despite the life-threatening nature of the problem and prefers outpatient vascular follow-up.  Assessment and  Plan: * Diabetic foot ulcer ([place]) -Patient with uncontrolled DM, neuropathy, and chronic non-healing wound that he been followed by wound care, has chronic osteomyeltitis -s/p angiogram with Dr. Stanford Breed on 3/8 and was considered optimized from a vascular standpoint and to consider L BKA.  -Most of patient's hesitation is b/c he cares for his wife whom has Stage IV colon cancer and has been declining.  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 Stanford Breed on 3/8; recommendation was to consider L BKA; patient very hesitant still, mostly seems to be centered around not knowing who would care for his wife -continue aspirin and statin for now -Vascular surgery has signed off and will see in clinic  HTN (hypertension) -Continue losartan and propranolol  DM2 (diabetes mellitus, type 2) ([place]) - A1c is 9.1, poor control (although he reports this is improved) -Continue glargine and SSI     Pain control - [place] Controlled Substance Reporting System database was reviewed. and  patient was instructed, not to drive, operate heavy machinery, perform activities at heights, swimming or participation in water activities or provide baby-sitting services while on Pain, Sleep and Anxiety Medications; until their outpatient Physician has advised to do so again.  Also recommended to not to take more than prescribed Pain, Sleep and Anxiety Medications.  Consultants: Vascular surgery; ID Procedures performed: None  Disposition: Home Diet recommendation:  Carb modified diet DISCHARGE MEDICATION: Allergies as of 02/23/2023       Reactions   Bee Venom Anaphylaxis, Swelling, Rash      Cefepime Rash   Unclear if due to cefepime        Medication List     TAKE these medications    amoxicillin-clavulanate 875-125 MG tablet Commonly known as: AUGMENTIN Take 1 tablet by mouth 2 (two) times daily.   aspirin EC 81 MG tablet Take 81 mg by mouth daily.   atorvastatin 10 MG tablet Commonly known as: LIPITOR Take 10 mg by mouth at bedtime.   B-D UF III MINI PEN NEEDLES 31G X 5 MM Misc Generic drug: Insulin Pen Needle USE AS DIRECTED UP TO 3 TIMES DAILY   cyanocobalamin 1000 MCG tablet Commonly known as: VITAMIN B12 Take 1,000 mcg by mouth daily.   doxycycline 100 MG capsule Commonly known as: Vibramycin Take 1 capsule (100 mg total) by mouth 2 (two) times daily.   EpiPen 2-Pak 0.3 mg/0.3 mL Soaj injection Generic drug: EPINEPHrine Inject 0.3 mg into the muscle as needed for anaphylaxis.   ferrous sulfate 325 (65 FE) MG tablet Take 325 mg by mouth daily with breakfast.   FreeStyle Libre 14 Day Sensor Misc Use to check blood glucose   furosemide 40 MG tablet Commonly known as: LASIX Take 40 mg by mouth daily.   losartan 100 MG tablet Commonly known as: COZAAR Take 100 mg by mouth daily.   NovoLOG FlexPen 100 UNIT/ML FlexPen Generic drug: insulin aspart Inject 5-11 Units into the skin 3 (three) times daily with meals. Sliding scale   oxyCODONE 5 MG immediate release tablet Commonly known as: Oxy IR/ROXICODONE Take 1 tablet (5 mg total) by mouth every 4 (four) hours as needed for moderate pain, severe pain or breakthrough pain.   potassium chloride 10 MEQ tablet Commonly known as: KLOR-CON Take 10 mEq by mouth daily.    propranolol 20 MG tablet Commonly known as: INDERAL Take 20 mg by mouth 2 (two) times daily.   [name] 200 UNIT/ML [name] Pen Generic drug: insulin degludec Inject 50-85 Units into the skin See admin instructions. Take 85 units in the morning and 50 units at bedtime   Vitamin D3 50 MCG (2000 UT) Caps Generic drug: Cholecalciferol Take 2,000 Units by mouth daily.               Discharge Care Instructions  (From admission, onward)           Start     Ordered   02/23/23 0000  Discharge wound care:       Comments: Apply Aquacel to left heel wound Q day, then cover with ABD pad and kerlex.  Moisten with NS each time to assist with removal.  Continue with wound care clinic.   02/23/23 1057            Discharge Exam: [place] [employer]   02/21/23 1912 02/22/23 0007  Weight: 111.6 kg 111 kg   Subjective: Patient reports that he is feeling better.  Foot is draining less, he feels comfortable  with discharge.  We discussed his difficult home situation, the importance of his dog to his mental health, and an RN friend who has been helping with his medical issues.  He knows that he needs amputation but is not sure about the logistics at this time and so is hoping to work on a plan at home until he follows up with vascular surgery.   Physical Exam:       Vitals:    02/22/23 0346 02/22/23 1250 02/22/23 1935 02/23/23 0453  BP: 138/64 139/67 130/66 (!) 110/58  Pulse: 76 81 85 72  Resp: '17 17 18 19  '$ Temp: 98.7 F (37.1 C) 99.6 F (37.6 C) 99 F (37.2 C) 98.9 F (37.2 C)  TempSrc: Oral        SpO2: 100% 99% 98% 97%  Weight:          Height:            General:  Appears calm and comfortable and is in NAD, sitting up in bedside chair Eyes:  EOMI, normal lids, iris ENT:  grossly normal hearing, lips & tongue, mmm Neck:  no LAD, masses or thyromegaly Cardiovascular:  RRR, no m/r/g.  Respiratory:   CTA bilaterally with no wheezes/rales/rhonchi.  Normal respiratory  effort. Abdomen:  soft, NT, ND Skin:  no rash or induration seen on limited exam Musculoskeletal:  LLE stasis dermatitis; L foot is wrapped in Kerlex with some purulent-appearing drainage along the plantar heel Psychiatric:  grossly normal mood and affect, speech fluent and appropriate, AOx3 Neurologic:  CN 2-12 grossly intact, moves all extremities in coordinated fashion    EKG: none     Labs on Admission: I have personally reviewed the available labs and imaging studies at the time of the admission.   Pertinent labs:     Na++ 134 Glucose 100 Albumin 2.9 WBC 4.2 Hgb 9.7    Condition at discharge: poor  The results of significant diagnostics from this hospitalization (including imaging, microbiology, ancillary and laboratory) are listed below for reference.   Imaging Studies: DG Foot 2 Views Left  Result Date: 02/21/2023 CLINICAL DATA:  Foot infection EXAM: LEFT FOOT - 2 VIEW COMPARISON:  08/06/2019, MRI 08/19/2022 FINDINGS: Large plantar heel ulcer extending to the surface of the calcaneus bone. Bony remodeling of calcaneus with heterogeneous sclerosis. 2.2 cm focal lucency in the calcaneus bone probably corresponding to intra osseous abscess on prior MRI. Vascular calcifications. Old appearing deformity of the first proximal phalanx. Moderate degenerative change at the first MTP joint. IMPRESSION: Large plantar heel ulcer extending to the surface of the calcaneus bone with bony remodeling and heterogeneous sclerosis of the calcaneus likely due to chronic osteomyelitis. 2.2 cm focal lucency in the calcaneus bone probably corresponding to intra osseous abscess on prior MRI. Electronically Signed   By: Donavan Foil M.D.   On: 02/21/2023 21:39   PERIPHERAL VASCULAR CATHETERIZATION  Result Date: 02/18/2023 DATE OF SERVICE: 02/18/2023  PATIENT:  [name]  67 y.o. male  PRE-OPERATIVE DIAGNOSIS:  Atherosclerosis of native arteries of left lower extremity causing ulceration   POST-OPERATIVE DIAGNOSIS:  Same  PROCEDURE:  1) Ultrasound guided right common femoral artery access 2) Aortogram 3) Left lower extremity angiogram with second order cannulation 4) Conscious sedation (25 minutes)   SURGEON:  Yevonne Aline. Stanford Breed, MD  ASSISTANT: none  ANESTHESIA:   local and IV sedation  ESTIMATED BLOOD LOSS: minimal  LOCAL MEDICATIONS USED:  LIDOCAINE  COUNTS: confirmed correct.  PATIENT DISPOSITION:  PACU -  hemodynamically stable.  Delay start of Pharmacological VTE agent (>24hrs) due to surgical blood loss or risk of bleeding: no  INDICATION FOR PROCEDURE: [name] is a 67 y.o. male with chronic left heel ulceration with osteomyelitis. After careful discussion of risks, benefits, and alternatives the patient was offered angiography. The patient understood and wished to proceed.  OPERATIVE FINDINGS: Terminal aorta and iliac arteries: Widely patent  Right lower extremity: Common femoral artery: Widely patent Profunda femoris artery: Widely patent Superficial femoral artery: Widely patent Popliteal artery: Widely patent Anterior tibial artery: occluded Tibioperoneal trunk: Widely patent Peroneal artery: Widely patent Posterior tibial artery: Widely patent Pedal circulation: fills via PT  DESCRIPTION OF PROCEDURE: After identification of the patient in the pre-operative holding area, the patient was transferred to the operating room. The patient was positioned supine on the operating room table.  Anesthesia was induced. The groins was prepped and draped in standard fashion. A surgical pause was performed confirming correct patient, procedure, and operative location.  The left groin was anesthetized with subcutaneous injection of 1% lidocaine. Using ultrasound guidance, the left common femoral artery was accessed with micropuncture technique. Fluoroscopy was used to confirm cannulation over the femoral head. The 721F sheath was upsized to 21F.  A Benson wire was advanced into the distal aorta. Over  the wire an omni flush catheter was advanced to the level of L2. Aortogram was performed - see above for details.  The right common iliac artery was selected with an omniflush catheter and benson guidewire. The wire was advanced into the common femoral artery. Over the wire the omni flush catheter was advanced into the external iliac artery. Selective angiography was performed - see above for details.  A mynx device was used to close the arteriotomy. Hemostasis was adequate upon completion.  Conscious sedation was administered with the use of IV fentanyl and midazolam under continuous physician and nurse monitoring.  Heart rate, blood pressure, and oxygen saturation were continuously monitored.  Total sedation time was 25 minutes  Upon completion of the case instrument and sharps counts were confirmed correct. The patient was transferred to the PACU in good condition. I was present for all portions of the procedure.  PLAN: Optimized from a vascular standpoint. Needs ASA '81mg'$  PO QD; high intensity statin therapy. I think BKA is his best option going forward.  Yevonne Aline. Stanford Breed, MD [facility] Phone Number: [phone] 02/18/2023 12:02 PM   VAS [place] LOWER EXTREMITY ARTERIAL DUPLEX  Result Date: 01/24/2023 LOWER EXTREMITY ARTERIAL DUPLEX STUDY Patient Name:  [name]  Date of Exam:   01/24/2023 Medical Rec #: [id number]         Accession #:    [id number] Date of Birth: 05-09-1956         Patient Gender: M Patient Age:   30 years Exam Location:  [place] Procedure:      VAS [place] LOWER EXTREMITY ARTERIAL DUPLEX Referring Phys: Fredirick Maudlin --------------------------------------------------------------------------------  Indications: PAD (peripheral artery disease) ([place]) [I73.9 (ICD-10-CM)]. High Risk Factors: Hypertension, Diabetes. Other Factors: Ulcer of right foot with fat layer exposed.  Current ABI: 01/24/2023 : Reported mild to moderate left lower extremity arterial               disease. Performing Technologist: Luane School RDCS  Examination Guidelines: A complete evaluation includes B-mode imaging, spectral Doppler, color Doppler, and power Doppler as needed of all accessible portions of each vessel. Bilateral testing is considered an integral part  of a complete examination. Limited examinations for reoccurring indications may be performed as noted.  +----------+--------+-----+---------------+---------+---------------+ RIGHT     PSV cm/sRatioStenosis       Waveform Comments        +----------+--------+-----+---------------+---------+---------------+ EIA [id number]          30-49% stenosistriphasic                +----------+--------+-----+---------------+---------+---------------+ CFA Distal140                         triphasiccalcific plaque +----------+--------+-----+---------------+---------+---------------+ DFA       145                         biphasic calcific plaque +----------+--------+-----+---------------+---------+---------------+ SFA Prox  124                         triphasic                +----------+--------+-----+---------------+---------+---------------+ SFA Mid   112                         biphasic                 +----------+--------+-----+---------------+---------+---------------+ SFA Distal69                          triphasic                +----------+--------+-----+---------------+---------+---------------+ POP Prox  84                          triphasic                +----------+--------+-----+---------------+---------+---------------+ ATA Mid   27                          biphasic                 +----------+--------+-----+---------------+---------+---------------+ PTA Mid   88                          triphasic                +----------+--------+-----+---------------+---------+---------------+ PERO Mid  75                          triphasic                 +----------+--------+-----+---------------+---------+---------------+ A focal velocity elevation of was obtained at external iliac artery. Findings are characteristic of 30-49% stenosis.  +----------+--------+-----+---------------+----------+--------+ LEFT      PSV cm/sRatioStenosis       Waveform  Comments +----------+--------+-----+---------------+----------+--------+ EIA Distal190          30-49% stenosisbiphasic           +----------+--------+-----+---------------+----------+--------+ CFA Distal161          30-49% stenosisbiphasic           +----------+--------+-----+---------------+----------+--------+ DFA       111                         biphasic           +----------+--------+-----+---------------+----------+--------+ SFA Prox  216          50-74%  stenosisbiphasic           +----------+--------+-----+---------------+----------+--------+ SFA Mid   162          30-49% stenosismonophasic         +----------+--------+-----+---------------+----------+--------+ SFA Distal160          30-49% stenosismonophasic         +----------+--------+-----+---------------+----------+--------+ POP Prox  296          50-74% stenosismonophasic         +----------+--------+-----+---------------+----------+--------+ POP Distal283          50-74% stenosismonophasic         +----------+--------+-----+---------------+----------+--------+ ATA Mid   48                          monophasic         +----------+--------+-----+---------------+----------+--------+ PTA Mid   101                         monophasic         +----------+--------+-----+---------------+----------+--------+ PERO Mid  147                         monophasic         +----------+--------+-----+---------------+----------+--------+ A focal velocity elevation of was obtained at external iliac artery. Findings are characteristic of 30-49% stenosis. A 2nd focal velocity elevation was  visualized, measuring at proximal superficial femoral artery. Findings are characteristic of 50-74% stenosis. A 3rd focal velocity elevation was visualized, measuring at popliteal artery. Findings are characteristic of 50-74% stenosis.  Summary: Right: 30-49% stenosis noted in the iliac segment. Diffuse atherosclerosis. Left: 30-49% stenosis noted in the iliac segment. 30-49% stenosis noted in the common femoral artery. 50-74% stenosis noted in the superficial femoral artery. 50-74% stenosis noted in the popliteal artery. He has stenosis in the EIA CFA SFA and popliteal  artery.  See table(s) above for measurements and observations. Electronically signed by Shirlee More MD on 01/24/2023 at 5:40:10 PM.    Final    VAS [place] ABI WITH/WO TBI  Result Date: 01/24/2023  LOWER EXTREMITY DOPPLER STUDY Patient Name:  [name]  Date of Exam:   01/24/2023 Medical Rec #: [id number]         Accession #:    [id number] Date of Birth: October 02, 1956         Patient Gender: M Patient Age:   11 years Exam Location:  [place] Procedure:      VAS [place] ABI WITH/WO TBI Referring Phys: Fredirick Maudlin --------------------------------------------------------------------------------  Indications: PAD (peripheral artery disease) ([place]) [I73.9 (ICD-10-CM)] High Risk Factors: Hypertension, Diabetes. Other Factors: Ulcer of right foot with fat layer exposed.  Comparison Study: No pror exam for comparison. Performing Technologist: Reel, Jimmy RDCS, RVT  Examination Guidelines: A complete evaluation includes at minimum, Doppler waveform signals and systolic blood pressure reading at the level of bilateral brachial, anterior tibial, and posterior tibial arteries, when vessel segments are accessible. Bilateral testing is considered an integral part of a complete examination. Photoelectric Plethysmograph (PPG) waveforms and toe systolic pressure readings are included as required and additional duplex testing as needed. Limited examinations for  reoccurring indications may be performed as noted.  ABI Findings: +---------+------------------+-----+-----------+--------+ Right    Rt Pressure (mmHg)IndexWaveform   Comment  +---------+------------------+-----+-----------+--------+ Brachial 161                                        +---------+------------------+-----+-----------+--------+  PTA      203               1.26 multiphasic         +---------+------------------+-----+-----------+--------+ DP       160               0.99 multiphasic         +---------+------------------+-----+-----------+--------+ Great Toe115               0.71 Normal              +---------+------------------+-----+-----------+--------+ +---------+------------------+-----+----------+-------+ Left     Lt Pressure (mmHg)IndexWaveform  Comment +---------+------------------+-----+----------+-------+ Brachial 159                                      +---------+------------------+-----+----------+-------+ PTA      120               0.75 monophasic        +---------+------------------+-----+----------+-------+ DP       129               0.80 monophasic        +---------+------------------+-----+----------+-------+ Great Toe108               0.67 Abnormal          +---------+------------------+-----+----------+-------+ +-------+-----------+-----------+------------+------------+ ABI/TBIToday's ABIToday's TBIPrevious ABIPrevious TBI +-------+-----------+-----------+------------+------------+ Right  1.26       .71                                 +-------+-----------+-----------+------------+------------+ Left   .80        .67                                 +-------+-----------+-----------+------------+------------+   Summary: Right: Resting right ankle-brachial index is within normal range. The right toe-brachial index is normal. Left: Resting left ankle-brachial index indicates mild to modertae left lower extremity  arterial disease the waveforms are monophasic consistent with more severe PAD. The left toe-brachial index is abnormal. Borderline moderate decreased ABI. *See table(s) above for measurements and observations.  Suggest Peripheral Vascular Consult. Electronically signed by Shirlee More MD on 01/24/2023 at 5:19:43 PM.    Final     Microbiology: Results for orders placed or performed during the hospital encounter of 02/21/23  Culture, blood (routine x 2)     Status: None (Preliminary result)   Collection Time: 02/21/23  8:30 PM   Specimen: BLOOD  Result Value Ref Range Status   Specimen Description   Final    BLOOD SITE NOT SPECIFIED Performed at [facility] [address], [place], [place] [address]    Special Requests   Final    BOTTLES DRAWN AEROBIC AND ANAEROBIC Blood Culture adequate volume Performed at [facility] [name] [place], [place] [address]    Culture   Final    NO GROWTH 2 DAYS Performed at [facility] [address], [place], [place] [address]    Report Status PENDING  Incomplete  Culture, blood (routine x 2)     Status: None (Preliminary result)   Collection Time: 02/22/23 12:36 AM   Specimen: BLOOD  Result Value Ref Range Status   Specimen Description   Final    BLOOD  SITE NOT SPECIFIED Performed at [facility] [address], [place], [place] [address]    Special Requests   Final    BOTTLES DRAWN AEROBIC AND ANAEROBIC Blood Culture adequate volume Performed at [facility] [address] [place], [place] [address]    Culture   Final    NO GROWTH 1 DAY Performed at [facility], [place] [address], [place], [place] [address]    Report Status PENDING  Incomplete     Discharge time spent: greater than 30 minutes.  Signed: Karmen Bongo, MD [facility] 02/23/2023

## 2023-02-23 NOTE — Progress Notes (Signed)
Pharmacy Antibiotic Note  [name] is a 67 y.o. male admitted on 02/21/2023 with  diabetic foot ulcer and intraosseous abscess .  Pharmacy has been consulted for Vancomycin dosing.  Noted SCr bump up today to 1.21, estimated CrCl~80 ml/min. Will adjust the Vancomycin dose slightly to keep within AUC goal range.    Plan: - Adjust Vancomycin to 750 mg IV every 12 hours (eAUC 439, SCr 1.21, Vd 0.5) - Will continue to follow renal function, culture results, LOT, and antibiotic de-escalation plans    Height: '6\' 2"'$  (188 cm) Weight: 111 kg (244 lb 11.4 oz) IBW/kg (Calculated) : 82.2  Temp (24hrs), Avg:99.2 F (37.3 C), Min:98.9 F (37.2 C), Max:99.6 F (37.6 C)  Recent Labs  Lab 02/18/23 0944 02/19/23 0131 02/21/23 2030 02/22/23 0858 02/23/23 0409  WBC  --   --  8.3 4.9 4.2  CREATININE 1.00 1.14 0.96 1.09 1.21     Estimated Creatinine Clearance: 78.5 mL/min (by C-G formula based on SCr of 1.21 mg/dL).    Allergies  Allergen Reactions   Bee Venom Anaphylaxis, Swelling and Rash         Cefepime Rash    Unclear if due to cefepime    Antimicrobials this admission: Ceftriaxone 3/11 >>  Flagyl 3/12 >>  Vancomycin 3/12 >>  Microbiology results: 3/12 BCx: NG <12H  Thank you for allowing pharmacy to be a part of this patient's care.  Alycia Rossetti, PharmD, BCPS Infectious Diseases Clinical Pharmacist 02/23/2023 7:51 AM   **Pharmacist phone directory can now be found on [email or web address] ([email or web address]).  Listed under [place].

## 2023-02-26 LAB — CULTURE, BLOOD (ROUTINE X 2)
Culture: NO GROWTH
Special Requests: ADEQUATE

## 2023-02-27 LAB — CULTURE, BLOOD (ROUTINE X 2)
Culture: NO GROWTH
Special Requests: ADEQUATE

## 2023-02-28 ENCOUNTER — Encounter (HOSPITAL_BASED_OUTPATIENT_CLINIC_OR_DEPARTMENT_OTHER): Payer: Medicare Other | Admitting: Internal Medicine

## 2023-02-28 DIAGNOSIS — E11621 Type 2 diabetes mellitus with foot ulcer: Secondary | ICD-10-CM | POA: Diagnosis present

## 2023-02-28 DIAGNOSIS — E1165 Type 2 diabetes mellitus with hyperglycemia: Secondary | ICD-10-CM | POA: Diagnosis not present

## 2023-02-28 DIAGNOSIS — L97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: Secondary | ICD-10-CM

## 2023-02-28 DIAGNOSIS — E1169 Type 2 diabetes mellitus with other specified complication: Secondary | ICD-10-CM | POA: Diagnosis not present

## 2023-02-28 DIAGNOSIS — L97523 Non-pressure chronic ulcer of other part of left foot with necrosis of muscle: Secondary | ICD-10-CM | POA: Diagnosis not present

## 2023-02-28 DIAGNOSIS — M86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, left ankle and foot: Secondary | ICD-10-CM | POA: Diagnosis not present

## 2023-03-01 NOTE — Progress Notes (Signed)
[name], [name] ([id number]) [id number] Page 1 of 12 Visit Report for 02/28/2023 Chief Complaint Document Details Patient Name: Date of Service: [place], [name] 02/28/2023 9:15 A M Medical Record Number: [id number] Patient Account Number: [id number] Date of Birth/Sex: Treating RN: December 31, 1955 (67 y.o. M) Primary Care Provider: Nelda Bucks Other Clinician: Referring Provider: Treating Provider/Extender: Colin Benton in Treatment: 24 Information Obtained from: Patient Chief Complaint Patients presents for treatment of an open diabetic ulcer and evaluation for hyperbaric oxygen therapy Electronic Signature(s) Signed: 02/28/2023 12:10:44 PM By: Kalman Shan DO Entered By: Kalman Shan on 02/28/2023 10:12:50 -------------------------------------------------------------------------------- Debridement Details Patient Name: Date of Service: [name], [name] 02/28/2023 9:15 A M Medical Record Number: [id number] Patient Account Number: [id number] Date of Birth/Sex: Treating RN: 08/18/1956 (67 y.o. [name] Primary Care Provider: Nelda Bucks Other Clinician: Referring Provider: Treating Provider/Extender: Colin Benton in Treatment: 24 Debridement Performed for Assessment: Wound #1 Left Calcaneus Performed By: Physician Kalman Shan, DO Debridement Type: Debridement Severity of Tissue Pre Debridement: Bone involvement without necrosis Level of Consciousness (Pre-procedure): Awake and Alert Pre-procedure Verification/Time Out Yes - 09:58 Taken: Start Time: 09:58 T Area Debrided (L x W): otal 6 (cm) x 5 (cm) = 30 (cm) Tissue and other material debrided: Non-Viable, Callus, Slough, Slough Level: Non-Viable Tissue Debridement Description: Selective/Open Wound Instrument: Curette Bleeding: Minimum Hemostasis Achieved: Pressure Response to Treatment: Procedure was tolerated  well Level of Consciousness (Post- Awake and Alert procedure): Post Debridement Measurements of Total Wound Length: (cm) 6 Width: (cm) 5 Depth: (cm) 0.4 Volume: (cm) 9.425 Character of Wound/Ulcer Post Debridement: Improved Severity of Tissue Post Debridement: Bone involvement without necrosis Post Procedure Diagnosis Same as Pre-procedure Notes scribed for Dr. Heber Matawan by Adline Peals, RN Electronic Signature(s) Signed: 02/28/2023 12:10:44 PM By: Kalman Shan DO Signed: 02/28/2023 4:54:15 PM By: Adline Peals Entered By: Adline Peals on 02/28/2023 09:59:10 [name] ([id number]) [id number] Page 2 of 12 -------------------------------------------------------------------------------- HPI Details Patient Name: Date of Service: [name], [name] 02/28/2023 9:15 A M Medical Record Number: [id number] Patient Account Number: [id number] Date of Birth/Sex: Treating RN: [birth year] (67 y.o. M) Primary Care Provider: Nelda Bucks Other Clinician: Referring Provider: Treating Provider/Extender: Colin Benton in Treatment: 24 History of Present Illness HPI Description: ADMISSION 09/10/2022 This is a 67 year old poorly controlled type II diabetic (last hemoglobin A1c 10.8%) who has had an ulcer on his heel for over 3 years. He has been seen in multiple wound care centers, including [facility] and [facility]. He reports that at least 3 doctors have recommended that he undergo below-knee amputation. He most recently met with Dr. Catalina Gravel, a vascular surgeon affiliated with [facility]. 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 Unfortunately, the patient has some extenuating social circumstances including the fact that he cares for his wife who has stage IV colon cancer and still works, driving vehicles for [employer]. He has had at  least 1 MRI that demonstrates osteomyelitis of the calcaneus. He was recently hospitalized at [facility] for sepsis and currently has a PICC line through which he receives IV antibiotics. He reports having had another MRI during that hospital stay along with a chest x-ray and EKG. He apparently contacted one of the hyperbaric therapy techs here and asked a number of questions about hyperbaric oxygen treatments. He subsequently self-referred to our center to undergo further evaluation and management.  I mention to him that [facility] actually has hyperbaric chambers, but he states that he lives in [place] and this would be more convenient for him given the intensive nature of the therapy and time requirement. ABI in clinic today was 0.94. The patient actually has 2 wounds. There is a wound on the dorsum of his left foot with heavy black eschar and slough present. After debridement, this was demonstrated to involve the muscle and the extensor tendons are exposed. On his heel, there is essentially a "shark bite" type wound, with much of the heel fat pad absent. The muscle layer is exposed. There is blue-green staining around the perimeter of the wound, but no significant odor. He says he has been applying collagen to the wound on his heel and Silvadene and Betadine to the wound on his dorsal foot. 09/20/2022: The heel wound is quite macerated with wet periwound callus. There is slough accumulation on the surface. The dorsal foot wound looks better this week. There is still exposed tendon, but it is fairly clean with just a little biofilm buildup. We are still working on gathering the required documentation to submit for pretreatment review for hyperbaric oxygen therapy. 09/29/2022: The dorsal foot wound continues to improve. I do not see any exposed tendon at this point. There is just some slough accumulation on the wound surface. He continues to have very wet macerated periwound callus on his  heel. There is slough on the surface, but it is loose and thin. There is an area of undermining at the 11 o'clock position, but the overlying skin and subcutaneous tissue is healthy and viable. He has been approved for hyperbaric oxygen therapy and will start treatment tomorrow. 10/06/2022: Continued contraction and improvement of the dorsal foot wound. There is just a little bit of slough accumulation on the wound surface. The periwound callus continues to accumulate on the heel wound and it is quite macerated. It is also persistently found with blue-green staining present. He did initiate his hyperbaric oxygen therapy, but has had significant difficulty with decompression. He will be going to ENT to have PE tubes placed. 10/18/2022: His hyperbaric oxygen therapy is on hold while his otological issues are being addressed. He came in again today with the periwound skin on both the dorsal aspect of his foot and his calcaneus completely macerated. The blue-green discoloration, however, has abated and the undermining on the calcaneus has improved. The dorsal foot wound has also contracted somewhat. 10/26/2022: The dorsal foot wound continues to contract and fill with good granulation tissue. The heel, once again, has a rim of macerated callus, but the undermining and tunneling continues to contract. He has completed his oral antibiotics and has been using the [facility] topical compounded antibiotic for his dressing changes at home. He is scheduled to see ENT this afternoon. 11/08/2022: The dorsal foot wound is flush with the surrounding skin and has a good granulation tissue surface. There is some slough accumulation. As usual, the heel has a rim of macerated callus but the dimensions are smaller and the tunneling and undermining have contracted further. He has resumed his hyperbaric oxygen therapy. 12/12; patient seen for wound evaluation. He is tolerating HBO although we could not dive him yesterday  because of relative hypoglycemia and the fact he had given himself NovoLog insulin before he came to clinic. Today his blood sugar is in the 180 range she should be fine. He has a large wound on the plantar calcaneus on the right and a  more superficial area on the dorsal foot. He is using a scooter for offloading 11/30/2022: The dorsal foot wound continues to contract. There is some slough on the wound surface. The large calcaneus wound has heaped up wet callus around the margins. Continued undermining. 12/08/2022: The dorsal foot wound is flat and flush with the surrounding skin surface. There is some slough present. Once again, the calcaneal wound has thick, absolutely macerated callus hanging off in tatters around the edges. The patient cannot explain to me why this part of his wound gets so wet. There is slough on the surface. The undermined portion of the wound has filled in. 12/17/2022: Earlier this week, he presented for his hyperbaric oxygen therapy and was hypotensive and ill-appearing. He also had what appeared to be an infected sebaceous cyst on his right upper arm. He was sent to the emergency department. He was given a fluid bolus and the ED provider lanced the cyst. He was prescribed doxycycline. He seems to be feeling better today. He notes that he has had copious drainage from his foot. On further questioning, he states that he has not been taking his oral diuretic. The dorsal foot wound has expanded somewhat, but is actually more superficial. The plantar heel wound is about the same. 12/27/2022: The dorsal foot wound has epithelialized further. The plantar foot wound is about the same size but has heaped up macerated callus around the perimeter. The intake nurse noted a tiny fragment of bone on his dressings and there is now an area at about the 5 o'clock position where 1 can palpate bone through a small slit in the soft tissues. The wound on his arm is contracting but he still likely  has cyst wall present due to the manner in which the infected sebaceous cyst was addressed in the ER. 01/01/2023: The wound on his shoulder has contracted considerably. The dorsal foot wound has some slough on the surface but also looks to be improving. The plantar foot wound is basically unchanged. 01/10/2023: The wound on his shoulder continues to diminish in size and depth. The dorsal foot wound is improving with just a light layer of slough on the surface. The plantar foot wound appears to be contracting but still has thick wet callus around the perimeter. We did in chamber T cPO2 monitoring during his last hyperbaric oxygen therapy. The results show that he has extremely poor tissue perfusion at room air and 1 atm of pressure. He does respond extremely well to hyperbaric oxygen therapy, however. DAZIEL, KILDAY [name] ([id number]) [id number] Page 3 of 12 01/17/2023: The wound on his shoulder is down to 0.6 cm in depth, down from 1 cm last week. The dorsal foot wound is quite a bit smaller this week with just some light slough on the surface. The plantar foot wound is also smaller and the area of bone that was exposed at the posterior heel is now covered. There is still substantial wet callus around the perimeter. He is scheduled to undergo formal vascular studies sometime next week. 01/24/2023: The wound on his shoulder has almost completely filled in, with just a little bit of depth remaining. The dorsal foot wound got macerated; it appears that his home health nurses are applying the drape for his heel wound over the top of this, meaning that the dorsal foot wound is not getting changed as regularly as it is supposed to. There is some slough on the surface. The plantar foot wound has thick wet callus around the perimeter.  The dimensions measure a little bit smaller. He is having his vascular studies done this afternoon. 02/07/2023: The wound on his shoulder is healed. The dorsal  foot wound continues to be subjected to excessive moisture due to the home health nurses applying the drape from his wound VAC over the top of it. It does measure smaller, however. There is more bone exposed on his calcaneus, unfortunately. He has heaped up wet callus around the edges of the heel wound again. He is going to undergo angiography with Dr. Stanford Breed on Friday. Hopefully there will be intervention available to him that may aid in his wound healing. 02/14/2023: He did not get his angiogram last week with Dr. Stanford Breed because he did not have a ride home. The dorsal foot wound looks a little bit better with just a light layer of slough. Unfortunately, there is more bone exposed at the calcaneus and I am concerned that we are nearing a point at which his leg will not be salvageable. 3/18; patient presents for follow-up. He was recently hospitalized on 3/11 for sepsis secondary to diabetic foot wound. He was dishcharged with Augmentin and doxycycline and plan is for 6 weeks of this. He currently has a wound VAC. Wound has declined in appearance. Amputation was recommended in the hospital however patient is not ready for this. Patient states he would like to stop HBO. He currently denies systemic signs of infection. Electronic Signature(s) Signed: 02/28/2023 12:10:44 PM By: Kalman Shan DO Entered By: Kalman Shan on 02/28/2023 10:13:49 -------------------------------------------------------------------------------- Physical Exam Details Patient Name: Date of Service: [place], [name] 02/28/2023 9:15 A M Medical Record Number: [id number] Patient Account Number: [id number] Date of Birth/Sex: Treating RN: 01-20-1956 (67 y.o. M) Primary Care Provider: Nelda Bucks Other Clinician: Referring Provider: Treating Provider/Extender: Colin Benton in Treatment: 24 Constitutional respirations regular, non-labored and within target range for  patient.. Cardiovascular 2+ dorsalis pedis/posterior tibialis pulses. Psychiatric pleasant and cooperative. Notes T the dorsal foot wound there is granulation tissue with a little bit of slough. T the plantar heel wound there is exposed calcaneus with unhealthy granulation o o tissue and surrounding callus. Electronic Signature(s) Signed: 02/28/2023 12:10:44 PM By: Kalman Shan DO Entered By: Kalman Shan on 02/28/2023 10:14:24 -------------------------------------------------------------------------------- Physician Orders Details Patient Name: Date of Service: [place], [name] 02/28/2023 9:15 A M Medical Record Number: [id number] Patient Account Number: [id number] Date of Birth/Sex: Treating RN: 1956-01-11 (67 y.o. [name] Primary Care Provider: Nelda Bucks Other Clinician: Referring Provider: Treating Provider/Extender: Colin Benton in Treatment: 24 Verbal / Phone Orders: No Diagnosis Coding Follow-up Appointments ppointment in 1 week. - Dr. Celine Ahr rm 4 Return A Cellular or Tissue Based Products Other Cellular or Tissue Based Products Orders/Instructions: - run insurance for [employer] (HOLD)-02/14/23 Bathing/ Shower/ Hygiene [place] ([id number]) [id number] Page 4 of 12 May shower with protection but do not get wound dressing(s) wet. Protect dressing(s) with water repellant cover (for example, large plastic bag) or a cast cover and may then take shower. Negative Presssure Wound Therapy Wound Vac to wound continuously at 171mm/hg pressure - to left calcaneus *************HOLD UNTIL CAN SEE DR. CANNON************ Black Foam Edema Control - Lymphedema / SCD / Other Left Lower Extremity Avoid standing for long periods of time. Off-Loading Wound #1 Left Calcaneus Other: - use knee scooter to ambulate, minimal weight bearing right foot Home Health New wound care orders this week; continue  Home Health for wound care. May utilize formulary  equivalent dressing for wound treatment orders unless otherwise specified. Other Home Health Orders/Instructions: - [facility] Hyperbaric Oxygen Therapy Wound #1 Left Calcaneus Evaluate for HBO Therapy - Continue HBOT for additional 20 treatments for total of 60 treatments Indication: - chronic refractory osteomyelitis If appropriate for treatment, begin HBOT per protocol: 2.5 ATA for 90 Minutes with 2 Five (5) Minute A Breaks ir Total Number of Treatments: - 60 One treatments per day (delivered Monday through Friday unless otherwise specified in Special Instructions below): Finger stick Blood Glucose Pre- and Post- HBOT Treatment. Follow Hyperbaric Oxygen Glycemia Protocol A frin (Oxymetazoline HCL) 0.05% nasal spray - 1 spray in both nostrils daily as needed prior to HBO treatment for difficulty clearing ears Other - pt requesting to stop HBO because he only has 4 treatments left 02/28/2023 Wound Treatment Wound #1 - Calcaneus Wound Laterality: Left Cleanser: Soap and Water 1 x Per Day/30 Days Discharge Instructions: May shower and wash wound with dial antibacterial soap and water prior to dressing change. Cleanser: Vashe 5.8 (oz) 1 x Per Day/30 Days Discharge Instructions: Cleanse the wound with Vashe prior to applying a clean dressing using gauze sponges, not tissue or cotton balls. Peri-Wound Care: Zinc Oxide Ointment 30g tube 1 x Per Day/30 Days Discharge Instructions: Apply Zinc Oxide to periwound with each dressing change Topical: keystone 1 x Per Day/30 Days Secondary Dressing: Woven Gauze Sponge, Non-Sterile 4x4 in (DME) (Generic) 1 x Per Day/30 Days Discharge Instructions: Wet with Vashe we to dry. Secondary Dressing: Zetuvit Plus 4x8 in (DME) (Generic) 1 x Per Day/30 Days Discharge Instructions: Apply over primary dressing as directed. Secured With: Coban Self-Adherent Wrap 4x5 (in/yd) (DME) (Generic) 1 x Per Day/30  Days Discharge Instructions: Secure with Coban as directed. Secured With: [employer], 4.5x3.1 (in/yd) (DME) (Generic) 1 x Per Day/30 Days Discharge Instructions: Secure with Kerlix as directed. Wound #2 - Foot Wound Laterality: Dorsal, Left Cleanser: Soap and Water 1 x Per Day/30 Days Discharge Instructions: May shower and wash wound with dial antibacterial soap and water prior to dressing change. Cleanser: Vashe 5.8 (oz) 1 x Per Day/30 Days Discharge Instructions: Cleanse the wound with Vashe prior to applying a clean dressing using gauze sponges, not tissue or cotton balls. Peri-Wound Care: keystone 1 x Per Day/30 Days Prim Dressing: Hydrofera Blue Ready Transfer Foam, 2.5x2.5 (in/in) 1 x Per Day/30 Days ary Discharge Instructions: Apply directly to wound bed as directed Prim Dressing: MediHoney Gel, tube 1.5 (oz) 1 x Per Day/30 Days ary Discharge Instructions: Apply to wound bed as instructed Secondary Dressing: Zetuvit Plus Silicone Border Dressing 4x4 (in/in) (DME) (Generic) 1 x Per Day/30 Days Discharge Instructions: Apply silicone border over primary dressing as directed. Secured With: Coban Self-Adherent Wrap 4x5 (in/yd) 1 x Per Day/30 Days Discharge Instructions: Secure with Coban as directed. AARNAV, NEGLIA [name] ([id number]) [id number] Page 5 of 12 Secured With: [employer], 4.5x3.1 (in/yd) 1 x Per Day/30 Days Discharge Instructions: Secure with Kerlix as directed. GLYCEMIA INTERVENTIONS PROTOCOL PRE-HBO GLYCEMIA INTERVENTIONS ACTION INTERVENTION Obtain pre-HBO capillary blood glucose (ensure 1 physician order is in chart). A. Notify HBO physician and await physician orders. 2 If result is 70 mg/dl or below: B. If the result meets the hospital definition of a critical result, follow hospital policy. A. Give patient an 8 ounce Glucerna Shake, an 8 ounce Ensure, or 8 ounces of a Glucerna/Ensure equivalent  dietary supplement*. B. Wait 30 minutes. If result is 71 mg/dl to 130 mg/dl: C. Retest patients capillary blood  glucose (CBG). D. If result greater than or equal to 110 mg/dl, proceed with HBO. If result less than 110 mg/dl, notify HBO physician and consider holding HBO. If result is 131 mg/dl to 249 mg/dl: A. Proceed with HBO. A. Notify HBO physician and await physician orders. B. It is recommended to hold HBO and do If result is 250 mg/dl or greater: blood/urine ketone testing. C. If the result meets the hospital definition of a critical result, follow hospital policy. POST-HBO GLYCEMIA INTERVENTIONS ACTION INTERVENTION Obtain post HBO capillary blood glucose (ensure 1 physician order is in chart). A. Notify HBO physician and await physician orders. 2 If result is 70 mg/dl or below: B. If the result meets the hospital definition of a critical result, follow hospital policy. A. Give patient an 8 ounce Glucerna Shake, an 8 ounce Ensure, or 8 ounces of a Glucerna/Ensure equivalent dietary supplement*. B. Wait 15 minutes for symptoms of If result is 71 mg/dl to 100 mg/dl: hypoglycemia (i.e. nervousness, anxiety, sweating, chills, clamminess, irritability, confusion, tachycardia or dizziness). C. If patient asymptomatic, discharge patient. If patient symptomatic, repeat capillary blood glucose (CBG) and notify HBO physician. If result is 101 mg/dl to 249 mg/dl: A. Discharge patient. A. Notify HBO physician and await physician orders. B. It is recommended to do blood/urine ketone If result is 250 mg/dl or greater: testing. C. If the result meets the hospital definition of a critical result, follow hospital policy. *Juice or candies are NOT equivalent products. If patient refuses the Glucerna or Ensure, please consult the hospital dietitian for an appropriate substitute. Electronic Signature(s) Signed: 02/28/2023 4:17:30 PM By: Kalman Shan DO Signed: 02/28/2023  4:54:15 PM By: Adline Peals Previous Signature: 02/28/2023 12:10:44 PM Version By: Kalman Shan DO Entered By: Adline Peals on 02/28/2023 14:25:41 -------------------------------------------------------------------------------- Problem List Details Patient Name: Date of Service: [place], [name] 02/28/2023 9:15 A M Medical Record Number: [id number] Patient Account Number: [id number] Date of Birth/Sex: Treating RN: [birth year] (67 y.o. M) Primary Care Provider: Nelda Bucks Other Clinician: Referring Provider: Treating Provider/Extender: Colin Benton in Treatment: [address] 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 09/10/2022 No Yes L97.523 Non-pressure chronic ulcer of other part of left foot with necrosis of muscle 09/10/2022 No Yes 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, left ankle and foot 09/10/2022 No Yes E11.65 Type 2 diabetes mellitus with hyperglycemia 09/10/2022 No Yes E11.621 Type 2 diabetes mellitus with foot ulcer 09/10/2022 No Yes Inactive Problems ICD-10 Code Description Active Date Inactive Date L72.3 Sebaceous cyst 12/17/2022 12/17/2022 Resolved Problems ICD-10 Code Description Active Date Resolved Date L98.492 Non-pressure chronic ulcer of skin of other sites with fat layer exposed 12/17/2022 01/03/2023 Electronic Signature(s) Signed: 02/28/2023 12:10:44 PM By: Kalman Shan DO Entered By: Kalman Shan on 02/28/2023 10:12:36 -------------------------------------------------------------------------------- Progress Note Details Patient Name: Date of Service: [name], [name] 02/28/2023 9:15 A M Medical Record Number: [id number] Patient Account Number: [id number] Date of Birth/Sex: Treating RN: 27-Jun-1956 (67 y.o. M) Primary Care Provider:  Nelda Bucks Other Clinician: Referring Provider: Treating Provider/Extender: Colin Benton in Treatment: 24 Subjective Chief Complaint Information obtained from Patient Patients presents for treatment of an open diabetic ulcer and evaluation for hyperbaric oxygen therapy History of Present Illness (HPI) ADMISSION 09/10/2022 This is a 67 year old poorly controlled type II diabetic (last hemoglobin A1c 10.8%) who has had an ulcer  on his heel for over 3 years. He has been seen in multiple wound care centers, including [facility] and [facility]. He reports that at least 3 doctors have recommended that he undergo below-knee amputation. He most recently met with Dr. Catalina Gravel, a vascular surgeon affiliated with [name]. 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 Unfortunately, the patient has some extenuating social circumstances including the fact that he cares for his wife who has stage IV colon cancer and still works, driving vehicles for [employer]. 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 He was recently hospitalized at [facility] for sepsis and currently has a PICC line through which he receives IV antibiotics. He reports having had another MRI during that hospital stay along with a chest x-ray and EKG. He apparently contacted one of the hyperbaric therapy techs here and asked a number of questions about hyperbaric oxygen treatments. He subsequently self-referred to our center to undergo further evaluation and management. I mention to him that [facility] actually has hyperbaric chambers, but he states that he lives in [place] and this would be more convenient for him given the intensive nature of the therapy and time requirement. ABI in clinic today was 0.94. MATHIAS, CICERO [name] ([id number]) [id number] Page 7 of  12 The patient actually has 2 wounds. There is a wound on the dorsum of his left foot with heavy black eschar and slough present. After debridement, this was demonstrated to involve the muscle and the extensor tendons are exposed. On his heel, there is essentially a "shark bite" type wound, with much of the heel fat pad absent. The muscle layer is exposed. There is blue-green staining around the perimeter of the wound, but no significant odor. He says he has been applying collagen to the wound on his heel and Silvadene and Betadine to the wound on his dorsal foot. 09/20/2022: The heel wound is quite macerated with wet periwound callus. There is slough accumulation on the surface. The dorsal foot wound looks better this week. There is still exposed tendon, but it is fairly clean with just a little biofilm buildup. We are still working on gathering the required documentation to submit for pretreatment review for hyperbaric oxygen therapy. 09/29/2022: The dorsal foot wound continues to improve. I do not see any exposed tendon at this point. There is just some slough accumulation on the wound surface. He continues to have very wet macerated periwound callus on his heel. There is slough on the surface, but it is loose and thin. There is an area of undermining at the 11 o'clock position, but the overlying skin and subcutaneous tissue is healthy and viable. He has been approved for hyperbaric oxygen therapy and will start treatment tomorrow. 10/06/2022: Continued contraction and improvement of the dorsal foot wound. There is just a little bit of slough accumulation on the wound surface. The periwound callus continues to accumulate on the heel wound and it is quite macerated. It is also persistently found with blue-green staining present. He did initiate his hyperbaric oxygen therapy, but has had significant difficulty with decompression. He will be going to ENT to have PE tubes placed. 10/18/2022: His  hyperbaric oxygen therapy is on hold while his otological issues are being addressed. He came in again today with the periwound skin on both the dorsal aspect of his foot and his calcaneus completely macerated. The blue-green discoloration, however, has abated and  the undermining on the calcaneus has improved. The dorsal foot wound has also contracted somewhat. 10/26/2022: The dorsal foot wound continues to contract and fill with good granulation tissue. The heel, once again, has a rim of macerated callus, but the undermining and tunneling continues to contract. He has completed his oral antibiotics and has been using the [facility] topical compounded antibiotic for his dressing changes at home. He is scheduled to see ENT this afternoon. 11/08/2022: The dorsal foot wound is flush with the surrounding skin and has a good granulation tissue surface. There is some slough accumulation. As usual, the heel has a rim of macerated callus but the dimensions are smaller and the tunneling and undermining have contracted further. He has resumed his hyperbaric oxygen therapy. 12/12; patient seen for wound evaluation. He is tolerating HBO although we could not dive him yesterday because of relative hypoglycemia and the fact he had given himself NovoLog insulin before he came to clinic. Today his blood sugar is in the 180 range she should be fine. He has a large wound on the plantar calcaneus on the right and a more superficial area on the dorsal foot. He is using a scooter for offloading 11/30/2022: The dorsal foot wound continues to contract. There is some slough on the wound surface. The large calcaneus wound has heaped up wet callus around the margins. Continued undermining. 12/08/2022: The dorsal foot wound is flat and flush with the surrounding skin surface. There is some slough present. Once again, the calcaneal wound has thick, absolutely macerated callus hanging off in tatters around the edges. The patient  cannot explain to me why this part of his wound gets so wet. There is slough on the surface. The undermined portion of the wound has filled in. 12/17/2022: Earlier this week, he presented for his hyperbaric oxygen therapy and was hypotensive and ill-appearing. He also had what appeared to be an infected sebaceous cyst on his right upper arm. He was sent to the emergency department. He was given a fluid bolus and the ED provider lanced the cyst. He was prescribed doxycycline. He seems to be feeling better today. He notes that he has had copious drainage from his foot. On further questioning, he states that he has not been taking his oral diuretic. The dorsal foot wound has expanded somewhat, but is actually more superficial. The plantar heel wound is about the same. 12/27/2022: The dorsal foot wound has epithelialized further. The plantar foot wound is about the same size but has heaped up macerated callus around the perimeter. The intake nurse noted a tiny fragment of bone on his dressings and there is now an area at about the 5 o'clock position where 1 can palpate bone through a small slit in the soft tissues. The wound on his arm is contracting but he still likely has cyst wall present due to the manner in which the infected sebaceous cyst was addressed in the ER. 01/01/2023: The wound on his shoulder has contracted considerably. The dorsal foot wound has some slough on the surface but also looks to be improving. The plantar foot wound is basically unchanged. 01/10/2023: The wound on his shoulder continues to diminish in size and depth. The dorsal foot wound is improving with just a light layer of slough on the surface. The plantar foot wound appears to be contracting but still has thick wet callus around the perimeter. We did in chamber T cPO2 monitoring during his last hyperbaric oxygen therapy. The results show that he has  extremely poor tissue perfusion at room air and 1 atm of pressure. He does  respond extremely well to hyperbaric oxygen therapy, however. 01/17/2023: The wound on his shoulder is down to 0.6 cm in depth, down from 1 cm last week. The dorsal foot wound is quite a bit smaller this week with just some light slough on the surface. The plantar foot wound is also smaller and the area of bone that was exposed at the posterior heel is now covered. There is still substantial wet callus around the perimeter. He is scheduled to undergo formal vascular studies sometime next week. 01/24/2023: The wound on his shoulder has almost completely filled in, with just a little bit of depth remaining. The dorsal foot wound got macerated; it appears that his home health nurses are applying the drape for his heel wound over the top of this, meaning that the dorsal foot wound is not getting changed as regularly as it is supposed to. There is some slough on the surface. The plantar foot wound has thick wet callus around the perimeter. The dimensions measure a little bit smaller. He is having his vascular studies done this afternoon. 02/07/2023: The wound on his shoulder is healed. The dorsal foot wound continues to be subjected to excessive moisture due to the home health nurses applying the drape from his wound VAC over the top of it. It does measure smaller, however. There is more bone exposed on his calcaneus, unfortunately. He has heaped up wet callus around the edges of the heel wound again. He is going to undergo angiography with Dr. Stanford Breed on Friday. Hopefully there will be intervention available to him that may aid in his wound healing. 02/14/2023: He did not get his angiogram last week with Dr. Stanford Breed because he did not have a ride home. The dorsal foot wound looks a little bit better with just a light layer of slough. Unfortunately, there is more bone exposed at the calcaneus and I am concerned that we are nearing a point at which his leg will not be salvageable. 3/18; patient presents for  follow-up. He was recently hospitalized on 3/11 for sepsis secondary to diabetic foot wound. He was dishcharged with Augmentin and doxycycline and plan is for 6 weeks of this. He currently has a wound VAC. Wound has declined in appearance. Amputation was recommended in the hospital however patient is not ready for this. Patient states he would like to stop HBO. He currently denies systemic signs of infection. Patient History Family History Cancer - Father,Mother, Diabetes - Mother,Father, Lung Disease - Father, Thyroid Problems - Mother, No family history of Heart Disease, Hereditary Spherocytosis, Hypertension, Kidney Disease, Seizures, Stroke, Tuberculosis. Social History Never smoker, Marital Status - Married, Alcohol Use - Never, Drug Use - No History, Caffeine Use - Daily. JEOVANNY, [name] ([id number]) [id number] Page 8 of 12 Medical History Eyes Patient has history of Cataracts Denies history of Glaucoma, Optic Neuritis Ear/Nose/Mouth/Throat Denies history of Chronic sinus problems/congestion, Middle ear problems Respiratory Patient has history of Sleep Apnea Cardiovascular Patient has history of Hypertension Gastrointestinal Denies history of Cirrhosis , Colitis, Crohnoos, Hepatitis A, Hepatitis B, Hepatitis C Endocrine Patient has history of Type II Diabetes Immunological Denies history of Lupus Erythematosus, Raynaudoos, Scleroderma Musculoskeletal Patient has history of Osteomyelitis - 2023 Neurologic Patient has history of Neuropathy - Bila lower extremities Oncologic Denies history of Received Chemotherapy, Received Radiation Psychiatric Denies history of Anorexia/bulimia, Confinement Anxiety Hospitalization/Surgery History - I and D Left calcaneus. - back surgery- laminectomy. -  eye surgery- Bila cataracts. - shoulder arthroscopy. Medical A Surgical History  Notes nd Cardiovascular hyperlipidemia Genitourinary AKI Objective Constitutional respirations regular, non-labored and within target range for patient.. Vitals Time Taken: 9:33 AM, Height: 74 in, Weight: 245 lbs, BMI: 31.5, Temperature: 97.8 F, Pulse: 94 bpm, Respiratory Rate: 16 breaths/min, Blood Pressure: 197/84 mmHg, Capillary Blood Glucose: 280 mg/dl. Cardiovascular 2+ dorsalis pedis/posterior tibialis pulses. Psychiatric pleasant and cooperative. General Notes: T the dorsal foot wound there is granulation tissue with a little bit of slough. T the plantar heel wound there is exposed calcaneus with o o unhealthy granulation tissue and surrounding callus. Integumentary (Hair, Skin) Wound #1 status is Open. Original cause of wound was Pressure Injury. The date acquired was: 03/09/2019. The wound has been in treatment 24 weeks. The wound is located on the Left Calcaneus. The wound measures 6cm length x 5cm width x 0.4cm depth; 23.562cm^2 area and 9.425cm^3 volume. There is bone and Fat Layer (Subcutaneous Tissue) exposed. There is undermining starting at 4:00 and ending at 7:00 with a maximum distance of 2cm. There is a large amount of serosanguineous drainage noted. The wound margin is thickened. There is small (1-33%) pink, pale granulation within the wound bed. There is a large (67-100%) amount of necrotic tissue within the wound bed including Adherent Slough. The periwound skin appearance had no abnormalities noted for color. The periwound skin appearance exhibited: Callus, Maceration. The periwound skin appearance did not exhibit: Dry/Scaly. Periwound temperature was noted as No Abnormality. Wound #2 status is Open. Original cause of wound was Pressure Injury. The date acquired was: 07/09/2022. The wound has been in treatment 24 weeks. The wound is located on the Left,Dorsal Foot. The wound measures 3.2cm length x 2.8cm width x 0.1cm depth; 7.037cm^2 area and 0.704cm^3 volume. There  is Fat Layer (Subcutaneous Tissue) exposed. There is no tunneling or undermining noted. There is a medium amount of serosanguineous drainage noted. The wound margin is flat and intact. There is large (67-100%) red, pink granulation within the wound bed. There is a small (1-33%) amount of necrotic tissue within the wound bed including Adherent Slough. The periwound skin appearance had no abnormalities noted for moisture. The periwound skin appearance had no abnormalities noted for color. The periwound skin appearance exhibited: Scarring. Periwound temperature was noted as No Abnormality. 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 There is more exposed calcaneum. He is not ready for amputation. I recommended he continue his oral antibiotics. I debrided nonviable tissue and recommended Vashe wet-to-dry dressings to the plantar foot wound. I will go ahead and hold the wound VAC until he sees Dr. Celine Ahr. T the dorsal foot wound I recommended Medihoney and Hydrofera Blue. Follow-up in 1 week. Per his request he would like to stop o HBO. Procedures Wound #1 Pre-procedure diagnosis of Wound #1 is a Diabetic Wound/Ulcer of the Lower Extremity located on the Left Calcaneus .Severity of Tissue Pre Debridement is: Bone involvement without necrosis. There was a Selective/Open Wound Non-Viable Tissue Debridement with a total area of 30 sq cm performed by Kalman Shan, DO. With the following instrument(s): Curette to remove Non-Viable tissue/material. Material removed includes Callus and Slough and. No  specimens were taken. A time out was conducted at 09:58, prior to the start of the procedure. A Minimum amount of bleeding was controlled with Pressure. The procedure was tolerated well. Post Debridement Measurements: 6cm length x 5cm width x 0.4cm depth; 9.425cm^3 volume. Character of Wound/Ulcer Post Debridement is improved. Severity of Tissue Post Debridement is: Bone involvement without necrosis. Post procedure Diagnosis Wound #1: Same as Pre-Procedure General Notes: scribed for Dr. Heber Lutz by Adline Peals, RN. Plan Follow-up Appointments: Return Appointment in 1 week. - Dr. Celine Ahr rm 4 Cellular or Tissue Based Products: Other Cellular or Tissue Based Products Orders/Instructions: - run insurance for A[employer]ig[employer] (HOLD)-02/14/23 Bathing/ Shower/ Hygiene: May shower with protection but do not get wound dressing(s) wet. Protect dressing(s) with water repellant cover (for example, large plastic bag) or a cast cover and may then take shower. Negative Presssure Wound Therapy: Wound Vac to wound continuously at 140mm/hg pressure - to left calcaneus *************HOLD UNTIL CAN SEE DR. CANNON************ Black Foam Edema Control - Lymphedema / SCD / Other: Avoid standing for long periods of time. Off-Loading: Wound #1 Left Calcaneus: Other: - use knee scooter to ambulate, minimal weight bearing right foot Home Health: New wound care orders this week; continue Home Health for wound care. May utilize formulary equivalent dressing for wound treatment orders unless otherwise specified. Other Home Health Orders/Instructions: - [facility] Hyperbaric Oxygen Therapy: Wound #1 Left Calcaneus: Evaluate for HBO Therapy - Continue HBOT for additional 20 treatments for total of 60 treatments Indication: - chronic refractory osteomyelitis If appropriate for treatment, begin HBOT per protocol: 2.5 ATA for 90 Minutes with 2 Five (5) Minute Air Breaks T Number of Treatments: - 60 otal One treatments per  day (delivered Monday through Friday unless otherwise specified in Special Instructions below): Finger stick Blood Glucose Pre- and Post- HBOT Treatment. Follow Hyperbaric Oxygen Glycemia Protocol Afrin (Oxymetazoline HCL) 0.05% nasal spray - 1 spray in both nostrils daily as needed prior to HBO treatment for difficulty clearing ears Other - pt requesting to stop HBO because he only has 4 treatments left 02/28/2023 WOUND #1: - Calcaneus Wound Laterality: Left Cleanser: Soap and Water 1 x Per Day/30 Days Discharge Instructions: May shower and wash wound with dial antibacterial soap and water prior to dressing change. Cleanser: Vashe 5.8 (oz) 1 x Per Day/30 Days Discharge Instructions: Cleanse the wound with Vashe prior to applying a clean dressing using gauze sponges, not tissue or cotton balls. Peri-Wound Care: keystone 1 x Per Day/30 Days Secondary Dressing: Woven Gauze Sponge, Non-Sterile 4x4 in 1 x Per Day/30 Days Discharge Instructions: Wet with Vashe we to dry. Secured With: Coban Self-Adherent Wrap 4x5 (in/yd) 1 x Per Day/30 Days Discharge Instructions: Secure with Coban as directed. Secured With: [employer], 4.5x3.1 (in/yd) 1 x Per Day/30 Days Discharge Instructions: Secure with Kerlix as directed. WOUND #2: - Foot Wound Laterality: Dorsal, Left Cleanser: Soap and Water 1 x Per Day/30 Days Discharge Instructions: May shower and wash wound with dial antibacterial soap and water prior to dressing change. Cleanser: Vashe 5.8 (oz) 1 x Per Day/30 Days Discharge Instructions: Cleanse the wound with Vashe prior to applying a clean dressing using gauze sponges, not tissue or cotton balls. Peri-Wound Care: keystone 1 x Per Day/30 Days Prim Dressing: Hydrofera Blue Ready Transfer Foam, 2.5x2.5 (in/in) 1 x Per Day/30 Days ary Discharge Instructions: Apply directly to wound bed as directed Prim Dressing: MediHoney Gel, tube 1.5 (oz) 1 x Per Day/30 Days ary Discharge Instructions: Apply  to wound  bed as instructed Secondary Dressing: Zetuvit Plus Silicone Border Dressing 4x4 (in/in) 1 x Per Day/30 Days Discharge Instructions: Apply silicone border over primary dressing as directed. Secured With: Coban Self-Adherent Wrap 4x5 (in/yd) 1 x Per Day/30 Days Discharge Instructions: Secure with Coban as directed. Secured With: [employer], 4.5x3.1 (in/yd) 1 x Per Day/30 Days Discharge Instructions: Secure with Kerlix as directed. [name], [name] ([id number]) [id number] Page 10 of 12 1. In office sharp debridement 2. Vashe wet-to-dry dressings 3. Hydrofera Blue and Medihoney 4. Aggressive offloadingooknee scooter 5. Follow-up in 1 week 6. Continue oral antibiotics Electronic Signature(s) Signed: 02/28/2023 12:10:44 PM By: Kalman Shan DO Entered By: Kalman Shan on 02/28/2023 10:16:45 -------------------------------------------------------------------------------- HxROS Details Patient Name: Date of Service: [name], [name] 02/28/2023 9:15 A M Medical Record Number: [id number] Patient Account Number: [id number] Date of Birth/Sex: Treating RN: 02/04/1956 (67 y.o. M) Primary Care Provider: Nelda Bucks Other Clinician: Referring Provider: Treating Provider/Extender: Colin Benton in Treatment: 24 Eyes Medical History: Positive for: Cataracts Negative for: Glaucoma; Optic Neuritis Ear/Nose/Mouth/Throat Medical History: Negative for: Chronic sinus problems/congestion; Middle ear problems Respiratory Medical History: Positive for: Sleep Apnea Cardiovascular Medical History: Positive for: Hypertension Past Medical History Notes: hyperlipidemia Gastrointestinal Medical History: Negative for: Cirrhosis ; Colitis; Crohns; Hepatitis A; Hepatitis B; Hepatitis C Endocrine Medical History: Positive for: Type II Diabetes Time with diabetes: 20 years Treated with: Insulin Blood sugar tested every  day: Yes Tested : 2-3 Genitourinary Medical History: Past Medical History Notes: AKI Immunological Medical History: Negative for: Lupus Erythematosus; Raynauds; Scleroderma Musculoskeletal Medical History: [name], [name] ([id number]) [phone].pdf Page 11 of 12 Positive for: Osteomyelitis - 2023 Neurologic Medical History: Positive for: Neuropathy - Bila lower extremities Oncologic Medical History: Negative for: Received Chemotherapy; Received Radiation Psychiatric Medical History: Negative for: Anorexia/bulimia; Confinement Anxiety HBO Extended History Items Eyes: Cataracts Immunizations Pneumococcal Vaccine: Received Pneumococcal Vaccination: Yes Received Pneumococcal Vaccination On or After 60th Birthday: Yes Implantable Devices Yes Hospitalization / Surgery History Type of Hospitalization/Surgery I and D Left calcaneus back surgery- laminectomy eye surgery- Bila cataracts shoulder arthroscopy Family and Social History Cancer: Yes - Father,Mother; Diabetes: Yes - Mother,Father; Heart Disease: No; Hereditary Spherocytosis: No; Hypertension: No; Kidney Disease: No; Lung Disease: Yes - Father; Seizures: No; Stroke: No; Thyroid Problems: Yes - Mother; Tuberculosis: No; Never smoker; Marital Status - Married; Alcohol Use: Never; Drug Use: No History; Caffeine Use: Daily; Financial Concerns: No; Food, Clothing or Shelter Needs: No; Support System Lacking: No; Transportation Concerns: No Electronic Signature(s) Signed: 02/28/2023 12:10:44 PM By: Kalman Shan DO Entered By: Kalman Shan on 02/28/2023 10:13:55 -------------------------------------------------------------------------------- SuperBill Details Patient Name: Date of Service: [name], [name] 02/28/2023 Medical Record Number: [id number] Patient Account Number: [id number] Date of Birth/Sex: Treating RN: [birth year] (67 y.o. 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 L97.523 Non-pressure chronic ulcer of other part of left foot with necrosis of 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, left ankle and foot E11.65 Type 2 diabetes mellitus with hyperglycemia E11.621 Type 2 diabetes mellitus with foot ulcer Facility Procedures : Muntean, RON 76 The patient participates with Medicare or their insurance follows the Medicare Facility Guidelines: CPT4 Code Description Modifier Quantity ALD [name] ([id number] Page 12 of 12 [id number] - DEBRIDE WOUND 1ST 20 SQ CM  OR < 1 ICD-10  Diagnosis Description 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.621 Type 2 diabetes mellitus with foot ulcer : 76 The patient participates with Medicare or their insurance follows the Medicare Facility Guidelines: [id number] - DEBRIDE WOUND EA ADDL 20 SQ CM 1 ICD-10 Diagnosis Description L97.424 Non-pressure chronic ulcer of left heel and midfoot with necrosis of  bone E11.621 Type 2 diabetes mellitus with foot ulcer Physician Procedures : CPT4 Code Description Modifier [id number] - WC PHYS DEBR WO ANESTH 20 SQ CM ICD-10 Diagnosis Description 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

## 2023-03-02 ENCOUNTER — Telehealth: Payer: Self-pay

## 2023-03-02 NOTE — Telephone Encounter (Signed)
Patient called into office checking the status of his appointment. I advised pt that the last msg received from Dr. Stanford Breed stated pt would follow up PRN. Pt was also consulted by Dr. Donzetta Matters during recent hospital stay on 02/22/2023. Per Dr. Donzetta Matters pt would follow up with our office once he gets things situated enough to have the [place]. Patient stated at this time he cannot proceed with the BKA because he is currently taking care of his wife with Stage IV cancer. He also stated he was going to have to figure out how to care for their dog if he will be in Rehab after the surgery. He states it is just him and his wife and they don't have resources,family and/or friends that can assist him with those things. I advised that I would give his information to our SW with [facility] with his permission. Patient voiced his permission to advise Raquel Sarna of his current situation. I advised pt that once he is able to plan for the BKA to contact our office to be seen by Dr. Stanford Breed. Pt voiced his understanding.

## 2023-03-04 ENCOUNTER — Other Ambulatory Visit (HOSPITAL_COMMUNITY): Payer: Self-pay

## 2023-03-04 ENCOUNTER — Ambulatory Visit (HOSPITAL_BASED_OUTPATIENT_CLINIC_OR_DEPARTMENT_OTHER): Payer: Medicare Other | Admitting: General Surgery

## 2023-03-07 ENCOUNTER — Encounter (HOSPITAL_BASED_OUTPATIENT_CLINIC_OR_DEPARTMENT_OTHER): Payer: Medicare Other | Admitting: General Surgery

## 2023-03-07 DIAGNOSIS — E11621 Type 2 diabetes mellitus with foot ulcer: Secondary | ICD-10-CM | POA: Diagnosis not present

## 2023-03-07 NOTE — Progress Notes (Signed)
[name], DENNING R ([id number]) [id number] Page 1 of 9 Visit Report for 03/07/2023 Arrival Information Details Patient Name: Date of Service: [place], [name] 03/07/2023 7:45 A M Medical Record Number: [id number] Patient Account Number: [id number] Date of Birth/Sex: Treating RN: [birth year] (67 y.o. [name] Primary Care Venicia Vandall: Nelda Bucks Other Clinician: Referring Duy Lemming: Treating Jermie Hippe/Extender: Doristine Bosworth in Treatment: 25 Visit Information History Since Last Visit Added or deleted any medications: No Patient Arrived: Knee Scooter Any new allergies or adverse reactions: No Arrival Time: 07:56 Had a fall or experienced change in No Accompanied By: self activities of daily living that may affect Transfer Assistance: None risk of falls: Patient Identification Verified: Yes Signs or symptoms of abuse/neglect since last visito No Secondary Verification Process Completed: Yes Hospitalized since last visit: No Patient Requires Transmission-Based Precautions: No Implantable device outside of the clinic excluding No Patient Has Alerts: No cellular tissue based products placed in the center since last visit: Has Dressing in Place as Prescribed: Yes Pain Present Now: No Electronic Signature(s) Signed: 03/07/2023 4:39:46 PM By: Adline Peals Entered By: Adline Peals on 03/07/2023 08:00:39 -------------------------------------------------------------------------------- Encounter Discharge Information Details Patient Name: Date of Service: [name], [name] 03/07/2023 7:45 A M Medical Record Number: [id number] Patient Account Number: [id number] Date of Birth/Sex: Treating RN: 1956-12-01 (67 y.o. [name] Primary Care Zaynah Chawla: Nelda Bucks Other Clinician: Referring Tzipporah Nagorski: Treating Mong Neal/Extender: Doristine Bosworth in Treatment: 25 Encounter Discharge  Information Items Post Procedure Vitals Discharge Condition: Stable Temperature (F): 98.6 Ambulatory Status: Knee Scooter Pulse (bpm): 97 Discharge Destination: Home Respiratory Rate (breaths/min): 16 Transportation: Private Auto Blood Pressure (mmHg): 169/74 Accompanied By: self Schedule Follow-up Appointment: Yes Clinical Summary of Care: Patient Declined Electronic Signature(s) Signed: 03/07/2023 4:39:46 PM By: Adline Peals Entered By: Adline Peals on 03/07/2023 08:31:44 -------------------------------------------------------------------------------- Lower Extremity Assessment Details Patient Name: Date of Service: [name], [name] 03/07/2023 7:45 A M Medical Record Number: [id number] Patient Account Number: [id number] Date of Birth/Sex: Treating RN: 31-Dec-1955 (67 y.o. [name] Primary Care Carlen Fils: Nelda Bucks Other Clinician: Referring Brianni Manthe: Treating Skylinn Vialpando/Extender: Johnnette Gourd Weeks in Treatment: 25 Edema Assessment Assessed: [name]: No] Patrice [name]: No] S[LeftMICKENZIE, SCHNEIDER R [id number] [[id number] Page 2 of 9] Edema: [Left: Ye] [Right: s] Calf Left: Right: Point of Measurement: From Medial Instep 47 cm Ankle Left: Right: Point of Measurement: From Medial Instep 25 cm Electronic Signature(s) Signed: 03/07/2023 4:39:46 PM By: Adline Peals Entered By: Adline Peals on 03/07/2023 08:06:20 -------------------------------------------------------------------------------- Multi Wound Chart Details Patient Name: Date of Service: [name], [name] 03/07/2023 7:45 A M Medical Record Number: [id number] Patient Account Number: [id number] Date of Birth/Sex: Treating RN: November 15, 1956 (66 y.o. M) Primary Care Tyishia Aune: Nelda Bucks Other Clinician: Referring Ella Guillotte: Treating Chevella Pearce/Extender: Doristine Bosworth in Treatment: 25 Vital Signs Height(in):  22 Capillary Blood Glucose(mg/dl): 280 Weight(lbs): 245 Pulse(bpm): 80 Body Mass Index(BMI): 31.5 Blood Pressure(mmHg): 169/74 Temperature(F): 98.6 Respiratory Rate(breaths/min): 16 [1:Photos:] [N/A:N/A] Left Calcaneus Left, Dorsal Foot N/A Wound Location: Pressure Injury Pressure Injury N/A Wounding Event: Diabetic Wound/Ulcer of the Lower Diabetic Wound/Ulcer of the Lower N/A Primary Etiology: Extremity Extremity Cataracts, Sleep Apnea, Hypertension, Cataracts, Sleep Apnea, Hypertension, N/A Comorbid History: Type II Diabetes, Osteomyelitis, Type II Diabetes, Osteomyelitis, Neuropathy Neuropathy 03/09/2019 07/09/2022 N/A Date Acquired: 25 25 N/A Weeks of Treatment: Open Open N/A Wound Status: No No N/A Wound Recurrence: 6.2x4x0.9 3.5x3.6x0.1 N/A Measurements L x W x D (cm) 19.478  9.896 N/A A (cm) : rea 17.53 0.99 N/A Volume (cm) : 9.80% -5.00% N/A % Reduction in A rea: 18.80% -5.10% N/A % Reduction in Volume: 5 Position 1 (o'clock): 1.4 Maximum Distance 1 (cm): 10 Starting Position 1 (o'clock): 2 Ending Position 1 (o'clock): 2 Maximum Distance 1 (cm): Yes No N/A Tunneling: Yes No N/A Undermining: Grade 3 Grade 2 N/A Classification: Large Medium N/A Exudate A mount: Serosanguineous Serosanguineous N/A Exudate Type: red, brown red, brown N/A Exudate Color: Thickened Flat and Intact N/A Wound Margin: Small (1-33%) Small (1-33%) N/A Granulation A mount: Pink, Pale Red, Pink N/A Granulation Quality: Large (67-100%) Large (67-100%) N/A Necrotic A mount: Adherent [name] N/A Necrotic Tissue: CABLE, [place] ([id number]) [phone].pdf Page 3 of 9 Fat Layer (Subcutaneous Tissue): Yes Fat Layer (Subcutaneous Tissue): Yes N/A Exposed Structures: Bone: Yes Fascia: No Fascia: No Tendon: No Tendon: No Muscle: No Muscle: No Joint: No Joint: No Bone: No Small (1-33%) Medium (34-66%)  N/A Epithelialization: Debridement - Excisional Debridement - Excisional N/A Debridement: Pre-procedure Verification/Time Out 08:26 08:26 N/A Taken: Subcutaneous, Slough Necrotic/Eschar, Subcutaneous, N/A Tissue Debrided: Slough Skin/Subcutaneous Tissue Skin/Subcutaneous Tissue N/A Level: 24.8 12.6 N/A Debridement A (sq cm): rea Curette Curette N/A Instrument: Minimum Minimum N/A Bleeding: Pressure Pressure N/A Hemostasis Achieved: Procedure was tolerated well Procedure was tolerated well N/A Debridement Treatment Response: 6.2x4x0.9 3.5x3.6x0.1 N/A Post Debridement Measurements L x W x D (cm) 17.53 0.99 N/A Post Debridement Volume: (cm) Callus: Yes Scarring: Yes N/A Periwound Skin Texture: Maceration: Yes Maceration: Yes N/A Periwound Skin Moisture: Dry/Scaly: No Dry/Scaly: No Rubor: Yes Rubor: Yes N/A Periwound Skin Color: No Abnormality No Abnormality N/A Temperature: Debridement Debridement N/A Procedures Performed: Treatment Notes Wound #1 (Calcaneus) Wound Laterality: Left Cleanser Soap and Water Discharge Instruction: May shower and wash wound with dial antibacterial soap and water prior to dressing change. Vashe 5.8 (oz) Discharge Instruction: Cleanse the wound with Vashe prior to applying a clean dressing using gauze sponges, not tissue or cotton balls. Peri-Wound Care Zinc Oxide Ointment 30g tube Discharge Instruction: Apply Zinc Oxide to periwound with each dressing change Topical keystone Primary Dressing Secondary Dressing Woven Gauze Sponge, Non-Sterile 4x4 in Discharge Instruction: Wet with Vashe we to dry. Zetuvit Plus 4x8 in Discharge Instruction: Apply over primary dressing as directed. Secured With [employer] 4x5 (in/yd) Discharge Instruction: Secure with Coban as directed. Kerlix Roll Sterile, 4.5x3.1 (in/yd) Discharge Instruction: Secure with Kerlix as directed. Compression Wrap Compression Stockings Add-Ons Wound  #2 (Foot) Wound Laterality: Dorsal, Left Cleanser Soap and Water Discharge Instruction: May shower and wash wound with dial antibacterial soap and water prior to dressing change. Vashe 5.8 (oz) Discharge Instruction: Cleanse the wound with Vashe prior to applying a clean dressing using gauze sponges, not tissue or cotton balls. Peri-Wound Care [address] ZAIDON, HIGA ([id number]) [id number] Page 4 of 9 Hydrofera Blue Ready Transfer Foam, 2.5x2.5 (in/in) Discharge Instruction: Apply directly to wound bed as directed MediHoney Gel, tube 1.5 (oz) Discharge Instruction: Apply to wound bed as instructed Secondary Dressing Zetuvit Plus 4x8 in Discharge Instruction: Apply over primary dressing as directed. Secured With [employer] 4x5 (in/yd) Discharge Instruction: Secure with Coban as directed. Kerlix Roll Sterile, 4.5x3.1 (in/yd) Discharge Instruction: Secure with Kerlix as directed. Compression Wrap Compression Stockings Add-Ons Electronic Signature(s) Signed: 03/07/2023 8:35:36 AM By: Fredirick Maudlin MD FACS Entered By: Fredirick Maudlin on 03/07/2023 08:35:36 -------------------------------------------------------------------------------- Multi-Disciplinary Care Plan Details Patient Name: Date of Service: [place], [name] 03/07/2023  7:45 A M Medical Record Number: [id number] Patient Account Number: [id number] Date of Birth/Sex: Treating RN: 1956-04-01 (67 y.o. 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 and treatment procedures Potential for barotraumas to ears, sinuses, teeth, and lungs or cerebral gas embolism related  to changes in atmospheric pressure inside hyperbaric oxygen chamber Potential for oxygen toxicity seizures related to delivery of 100% oxygen at an increased atmospheric pressure Potential for pulmonary oxygen toxicity related to delivery of 100% oxygen at an increased atmospheric pressure Goals: Barotrauma will be prevented during HBO2 Date Initiated: 10/06/2022 Target Resolution Date: 03/14/2023 Goal Status: Active Patient and/or family will be able to state/discuss factors appropriate to the management of their disease process during treatment Date Initiated: 10/06/2022 Target Resolution Date: 03/14/2023 Goal Status: Active Patient will tolerate the hyperbaric oxygen therapy treatment Date Initiated: 10/06/2022 Target Resolution Date: 03/14/2023 Goal Status: Active Patient/caregiver will verbalize understanding of HBO goals, rationale, procedures and potential hazards Date Initiated: 10/06/2022 Target Resolution Date: 03/14/2023 Goal Status: Active Interventions: Administer decongestants, per physician orders, prior to HBO2 Administer the correct therapeutic gas delivery based on the patients needs and limitations, per physician order Assess and provide for patients comfort related to the hyperbaric environment and equalization of middle ear Assess for signs and symptoms related to adverse events, including but not limited to confinement anxiety, pneumothorax, oxygen toxicity and baurotrauma NotesDEMONDRE, BOSTIC ([id number]) [phone].pdf Page 5 of 9 Wound/Skin Impairment Nursing Diagnoses: Impaired tissue integrity Goals: Patient/caregiver will verbalize understanding of skin care regimen Date Initiated: 10/06/2022 Target Resolution Date: 03/10/2023 Goal Status: Active Ulcer/skin breakdown will have a volume reduction of 30% by week 4 Date Initiated: 09/10/2022 Date Inactivated: 10/06/2022 Target Resolution Date: 10/08/2022 Goal Status: Unmet Unmet Reason:  osteo, HBOT Ulcer/skin breakdown will have a volume reduction of 50% by week 8 Date Initiated: 10/06/2022 Date Inactivated: 02/14/2023 Target Resolution Date: 01/14/2023 Unmet Reason: insufficient perfusion Goal Status: Unmet to Left lower extremity Ulcer/skin breakdown will have a volume reduction of 80% by week 12 Date Initiated: 02/14/2023 Target Resolution Date: 03/15/2023 Goal Status: Active Interventions: Assess ulceration(s) every visit Provide education on ulcer and skin care Treatment Activities: Consult for HBO : 09/10/2022 Skin care regimen initiated : 09/10/2022 Notes: NPWT started 12/27/22 Electronic Signature(s) Signed: 03/07/2023 4:39:46 PM By: Adline Peals Entered By: Adline Peals on 03/07/2023 08:14:37 -------------------------------------------------------------------------------- Pain Assessment Details Patient Name: Date of Service: [name], [name] 03/07/2023 7:45 A M Medical Record Number: [id number] Patient Account Number: [id number] Date of Birth/Sex: Treating RN: [birth year] (67 y.o. [name] Primary Care Sakira Dahmer: Nelda Bucks Other Clinician: Referring Jovaughn Wojtaszek: Treating [facility] Cortez/Extender: Doristine Bosworth in Treatment: 25 Active Problems Location of Pain Severity and Description of Pain Patient Has Paino No Site Locations Rate the pain. Current Pain Level: 0 Pain Management and Medication Current Pain Management: LAMART, ANNIS ([id number]) [id number].pdf Page 6 of 9 Electronic Signature(s) Signed: 03/07/2023 4:39:46 PM By: Sabas Sous By: Adline Peals on 03/07/2023 08:01:02 -------------------------------------------------------------------------------- Patient/Caregiver Education Details Patient Name: Date of Service: [place] NA LD R. 3/25/2024andnbsp7:45 A M Medical Record Number: [id number] Patient Account Number: [id number] Date of Birth/Gender:  Treating RN: 1955-12-24 (67 y.o. [name] Primary Care Physician: Nelda Bucks Other Clinician: Referring Physician: Treating Physician/Extender: Doristine Bosworth in Treatment:  25 Education Assessment Education Provided To: Patient Education Topics Provided Wound/Skin Impairment: Methods: Explain/Verbal Responses: Reinforcements needed, State content correctly Electronic Signature(s) Signed: 03/07/2023 4:39:46 PM By: Adline Peals Entered By: Adline Peals on 03/07/2023 08:14:49 -------------------------------------------------------------------------------- Wound Assessment Details Patient Name: Date of Service: [name], [name] 03/07/2023 7:45 A M Medical Record Number: [id number] Patient Account Number: [id number] Date of Birth/Sex: Treating RN: 07-16-1956 (67 y.o. [name] Primary Care Viliami Bracco: Nelda Bucks Other Clinician: Referring Sonu Kruckenberg: Treating Doneta Bayman/Extender: Doristine Bosworth in Treatment: 25 Wound Status Wound Number: 1 Primary Diabetic Wound/Ulcer of the Lower Extremity Etiology: Wound Location: Left Calcaneus Wound Open Wounding Event: Pressure Injury Status: Date Acquired: 03/09/2019 Comorbid Cataracts, Sleep Apnea, Hypertension, Type II Diabetes, Weeks Of Treatment: 25 History: Osteomyelitis, Neuropathy Clustered Wound: No Photos Wound Measurements Length: (cm) 6.2 Width: (cm) 4 Depth: (cm) 0.9 Area: (cm) 19.478 Volume: (cm) 17.53 [place], Amori R ([id number]) % Reduction in Area: 9.8% % Reduction in Volume: 18.8% Epithelialization: Small (1-33%) Tunneling: Yes Position (o'clock): 5 Maximum Distance: (cm) 1.4 [id number] Page 7 of 9 Maximum Distance: (cm) 1.4 Undermining: Yes Starting Position (o'clock): 10 Ending Position (o'clock): 2 Maximum Distance: (cm) 2 Wound Description Classification: Grade 3 Wound Margin:  Thickened Exudate Amount: Large Exudate Type: Serosanguineous Exudate Color: red, brown Foul Odor After Cleansing: No Slough/Fibrino Yes Wound Bed Granulation Amount: Small (1-33%) Exposed Structure Granulation Quality: Pink, Pale Fascia Exposed: No Necrotic Amount: Large (67-100%) Fat Layer (Subcutaneous Tissue) Exposed: Yes Necrotic Quality: Adherent Slough Tendon Exposed: No Muscle Exposed: No Joint Exposed: No Bone Exposed: Yes Periwound Skin Texture Texture Color No Abnormalities Noted: No No Abnormalities Noted: No Callus: Yes Rubor: Yes Moisture Temperature / Pain No Abnormalities Noted: No Temperature: No Abnormality Dry / Scaly: No Maceration: Yes Treatment Notes Wound #1 (Calcaneus) Wound Laterality: Left Cleanser Soap and Water Discharge Instruction: May shower and wash wound with dial antibacterial soap and water prior to dressing change. Vashe 5.8 (oz) Discharge Instruction: Cleanse the wound with Vashe prior to applying a clean dressing using gauze sponges, not tissue or cotton balls. Peri-Wound Care Zinc Oxide Ointment 30g tube Discharge Instruction: Apply Zinc Oxide to periwound with each dressing change Topical keystone Primary Dressing Secondary Dressing Woven Gauze Sponge, Non-Sterile 4x4 in Discharge Instruction: Wet with Vashe we to dry. Zetuvit Plus 4x8 in Discharge Instruction: Apply over primary dressing as directed. Secured With [employer] 4x5 (in/yd) Discharge Instruction: Secure with Coban as directed. Kerlix Roll Sterile, 4.5x3.1 (in/yd) Discharge Instruction: Secure with Kerlix as directed. Compression Wrap Compression Stockings Add-Ons Electronic Signature(s) Signed: 03/07/2023 4:39:46 PM By: Adline Peals Entered By: Adline Peals on 03/07/2023 08:12:48 [name], HALPERT ([id number]) [id number] Page 8 of  9 -------------------------------------------------------------------------------- Wound Assessment Details Patient Name: Date of Service: [name], [name] 03/07/2023 7:45 A M Medical Record Number: [id number] Patient Account Number: [id number] Date of Birth/Sex: Treating RN: 30-Aug-1956 (67 y.o. [name] Primary Care [name] Alfieri: Nelda Bucks Other Clinician: Referring Christoph Copelan: Treating Keshun Berrett/Extender: Doristine Bosworth in Treatment: 25 Wound Status Wound Number: 2 Primary Diabetic Wound/Ulcer of the Lower Extremity Etiology: Wound Location: Left, Dorsal Foot Wound Open Wounding Event: Pressure Injury Status: Date Acquired: 07/09/2022 Comorbid Cataracts, Sleep Apnea, Hypertension, Type II Diabetes, Weeks Of Treatment: 25 History: Osteomyelitis, Neuropathy Clustered Wound: No Photos Wound Measurements Length: (cm) 3.5 Width: (cm) 3.6 Depth: (cm) 0.1 Area: (cm) 9.896 Volume: (cm) 0.99 % Reduction in Area: -5% % Reduction in Volume: -5.1% Epithelialization: Medium (34-66%) Tunneling: No  Undermining: No Wound Description Classification: Grade 2 Wound Margin: Flat and Intact Exudate Amount: Medium Exudate Type: Serosanguineous Exudate Color: red, brown Foul Odor After Cleansing: No Slough/Fibrino Yes Wound Bed Granulation Amount: Small (1-33%) Exposed Structure Granulation Quality: Red, Pink Fascia Exposed: No Necrotic Amount: Large (67-100%) Fat Layer (Subcutaneous Tissue) Exposed: Yes Necrotic Quality: Eschar, Adherent Slough Tendon Exposed: No Muscle Exposed: No Joint Exposed: No Bone Exposed: No Periwound Skin Texture Texture Color No Abnormalities Noted: No No Abnormalities Noted: No Scarring: Yes Rubor: Yes Moisture Temperature / Pain No Abnormalities Noted: Yes Temperature: No Abnormality Treatment Notes Wound #2 (Foot) Wound Laterality: Dorsal, Left Cleanser Soap and Water Discharge Instruction: May shower  and wash wound with dial antibacterial soap and water prior to dressing change. Vashe 5.8 (oz) Discharge Instruction: Cleanse the wound with Vashe prior to applying a clean dressing using gauze sponges, not tissue or cotton balls. [name], LOVETTE R ([id number]) [id number] Page 9 of 9 Peri-Wound Care keystone Topical Primary Dressing Hydrofera Blue Ready Transfer Foam, 2.5x2.5 (in/in) Discharge Instruction: Apply directly to wound bed as directed MediHoney Gel, tube 1.5 (oz) Discharge Instruction: Apply to wound bed as instructed Secondary Dressing Zetuvit Plus 4x8 in Discharge Instruction: Apply over primary dressing as directed. Secured With [employer] 4x5 (in/yd) Discharge Instruction: Secure with Coban as directed. Kerlix Roll Sterile, 4.5x3.1 (in/yd) Discharge Instruction: Secure with Kerlix as directed. Compression Wrap Compression Stockings Add-Ons Electronic Signature(s) Signed: 03/07/2023 4:39:46 PM By: Adline Peals Entered By: Adline Peals on 03/07/2023 08:13:29 -------------------------------------------------------------------------------- Vitals Details Patient Name: Date of Service: [name], [name] 03/07/2023 7:45 A M Medical Record Number: [id number] Patient Account Number: [id number] Date of Birth/Sex: Treating RN: 1956-05-28 (67 y.o. [name] Primary Care Delorese Sellin: Nelda Bucks Other Clinician: Referring Sayward Horvath: Treating Talullah Abate/Extender: Doristine Bosworth in Treatment: 25 Vital Signs Time Taken: 08:00 Temperature (F): 98.6 Height (in): 74 Pulse (bpm): 97 Weight (lbs): 245 Respiratory Rate (breaths/min): 16 Body Mass Index (BMI): 31.5 Blood Pressure (mmHg): 169/74 Capillary Blood Glucose (mg/dl): 280 Reference Range: 80 - 120 mg / dl Electronic Signature(s) Signed: 03/07/2023 4:39:46 PM By: Adline Peals Entered By: Adline Peals on 03/07/2023  08:00:55

## 2023-03-07 NOTE — Progress Notes (Signed)
HONDO, VANDENHEUVEL [name] ([id number]) [id number] Page 1 of 12 Visit Report for 03/07/2023 Chief Complaint Document Details Patient Name: Date of Service: [name], [name]. 03/07/2023 7:45 A M Medical Record Number: [id number] Patient Account Number: [id number] Date of Birth/Sex: Treating RN: Sep 04, 1956 (67 y.o. M) Primary Care Provider: Nelda Bucks Other Clinician: Referring Provider: Treating Provider/Extender: Doristine Bosworth in Treatment: 25 Information Obtained from: Patient Chief Complaint Patients presents for treatment of an open diabetic ulcer and evaluation for hyperbaric oxygen therapy Electronic Signature(s) Signed: 03/07/2023 8:36:11 AM By: Fredirick Maudlin MD FACS Entered By: Fredirick Maudlin on 03/07/2023 08:36:11 -------------------------------------------------------------------------------- Debridement Details Patient Name: Date of Service: [name], [name] 03/07/2023 7:45 A M Medical Record Number: [id number] Patient Account Number: [id number] Date of Birth/Sex: Treating RN: 1956-11-06 (67 y.o. [name] Primary Care Provider: Nelda Bucks Other Clinician: Referring Provider: Treating Provider/Extender: Doristine Bosworth in Treatment: 25 Debridement Performed for Assessment: Wound #2 Left,Dorsal Foot Performed By: Physician Fredirick Maudlin, MD Debridement Type: Debridement Severity of Tissue Pre Debridement: Fat layer exposed Level of Consciousness (Pre-procedure): Awake and Alert Pre-procedure Verification/Time Out Yes - 08:26 Taken: Start Time: 08:26 T Area Debrided (L x W): otal 3.5 (cm) x 3.6 (cm) = 12.6 (cm) Tissue and other material debrided: Non-Viable, Eschar, Slough, Subcutaneous, Slough Level: Skin/Subcutaneous Tissue Debridement Description: Excisional Instrument: Curette Bleeding: Minimum Hemostasis Achieved: Pressure Response to Treatment: Procedure was  tolerated well Level of Consciousness (Post- Awake and Alert procedure): Post Debridement Measurements of Total Wound Length: (cm) 3.5 Width: (cm) 3.6 Depth: (cm) 0.1 Volume: (cm) 0.99 Character of Wound/Ulcer Post Debridement: Improved Severity of Tissue Post Debridement: Fat layer exposed Post Procedure Diagnosis Same as Pre-procedure Notes scribed for Dr. Celine Ahr by Adline Peals, RN Electronic Signature(s) Signed: 03/07/2023 9:28:06 AM By: Fredirick Maudlin MD FACS Signed: 03/07/2023 4:39:46 PM By: Adline Peals Entered By: Adline Peals on 03/07/2023 08:27:59 [name] ([id number]) [id number] Page 2 of 12 -------------------------------------------------------------------------------- Debridement Details Patient Name: Date of Service: [name], [name] [name] 03/07/2023 7:45 A M Medical Record Number: [id number] Patient Account Number: [id number] Date of Birth/Sex: Treating RN: Feb 04, 1956 (67 y.o. [name] Primary Care Provider: Nelda Bucks Other Clinician: Referring Provider: Treating Provider/Extender: Doristine Bosworth in Treatment: 25 Debridement Performed for Assessment: Wound #1 Left Calcaneus Performed By: Physician Fredirick Maudlin, MD Debridement Type: Debridement Severity of Tissue Pre Debridement: Bone involvement without necrosis Level of Consciousness (Pre-procedure): Awake and Alert Pre-procedure Verification/Time Out Yes - 08:26 Taken: Start Time: 08:26 T Area Debrided (L x W): otal 6.2 (cm) x 4 (cm) = 24.8 (cm) Tissue and other material debrided: Non-Viable, Slough, Subcutaneous, Skin: Epidermis, Slough Level: Skin/Subcutaneous Tissue Debridement Description: Excisional Instrument: Curette Bleeding: Minimum Hemostasis Achieved: Pressure Response to Treatment: Procedure was tolerated well Level of Consciousness (Post- Awake and Alert procedure): Post Debridement  Measurements of Total Wound Length: (cm) 6.2 Width: (cm) 4 Depth: (cm) 0.9 Volume: (cm) 17.53 Character of Wound/Ulcer Post Debridement: Improved Severity of Tissue Post Debridement: Bone involvement without necrosis Post Procedure Diagnosis Same as Pre-procedure Notes scribed for Dr. Celine Ahr by Adline Peals, RN Electronic Signature(s) Signed: 03/07/2023 9:28:06 AM By: Fredirick Maudlin MD FACS Signed: 03/07/2023 4:39:46 PM By: Adline Peals Entered By: Adline Peals on 03/07/2023 08:31:01 -------------------------------------------------------------------------------- HPI Details Patient Name: Date of Service: [name], [name] 03/07/2023 7:45 A M Medical Record Number: [id number] Patient Account Number: [id number] Date of Birth/Sex: Treating RN: [birth year] (67 y.o. M) Primary Care  Provider: Nelda Bucks Other Clinician: Referring Provider: Treating Provider/Extender: Doristine Bosworth in Treatment: 25 History of Present Illness HPI Description: ADMISSION 09/10/2022 This is a 68 year old poorly controlled type II diabetic (last hemoglobin A1c 10.8%) who has had an ulcer on his heel for over 3 years. He has been seen in multiple wound care centers, including [facility] and [facility]. He reports that at least 3 doctors have recommended that he undergo below-knee amputation. He most recently met with Dr. Catalina Gravel, a vascular surgeon affiliated with [facility]. 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 Unfortunately, [name] patient has some extenuating social circumstances including [name] fact that he cares for his wife who has stage IV colon cancer and still works, driving vehicles for [employer]. 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 He was recently hospitalized at [facility] for sepsis and currently has a PICC line through  which he receives IV antibiotics. He reports having had another MRI during that hospital stay along with a chest x-ray and EKG. He apparently contacted one of [name] hyperbaric therapy techs here and asked a number of questions about hyperbaric oxygen treatments. He subsequently self-referred to our center to undergo further evaluation and management. I mention to him that [facility] actually has hyperbaric chambers, but he states that he lives in [place] and this would be more convenient for him given [name] intensive nature of [name] therapy and time requirement. ABI in clinic today was 0.94. ARRAN, MONACO [name] ([id number]) [id number] Page 3 of 12 [name] patient actually has 2 wounds. There is a wound on [name] dorsum of his left foot with heavy black eschar and slough present. After debridement, this was demonstrated to involve [name] muscle and [name] extensor tendons are exposed. On his heel, there is essentially a "shark bite" type wound, with much of [name] heel fat pad absent. [name] muscle layer is exposed. There is blue-green staining around [name] perimeter of [name] wound, but no significant odor. He says he has been applying collagen to [name] wound on his heel and Silvadene and Betadine to [name] wound on his dorsal foot. 09/20/2022: [name] heel wound is quite macerated with wet periwound callus. There is slough accumulation on [name] surface. [name] dorsal foot wound looks better this week. There is still exposed tendon, but it is fairly clean with just a little biofilm buildup. We are still working on gathering [name] required documentation to submit for pretreatment review for hyperbaric oxygen therapy. 09/29/2022: [name] dorsal foot wound continues to improve. I do not see any exposed tendon at this [name]. There is just some slough accumulation on [name] wound surface. He continues to have very wet macerated periwound callus on his heel. There is slough on [name] surface, but it is loose and thin. There is an area  of undermining at [name] 11 o'clock position, but [name] overlying skin and subcutaneous tissue is healthy and viable. He has been approved for hyperbaric oxygen therapy and will start treatment tomorrow. 10/06/2022: Continued contraction and improvement of [name] dorsal foot wound. There is just a little bit of slough accumulation on [name] wound surface. [name] periwound callus continues to accumulate on [name] heel wound and it is quite macerated. It is also persistently found with blue-green staining present. He did initiate his hyperbaric oxygen therapy, but has had significant difficulty with decompression. He will be going to ENT to have PE tubes placed. 10/18/2022:  His hyperbaric oxygen therapy is on hold while his otological issues are being addressed. He came in again today with [name] periwound skin on both [name] dorsal aspect of his foot and his calcaneus completely macerated. [name] blue-green discoloration, however, has abated and [name] undermining on [name] calcaneus has improved. [name] dorsal foot wound has also contracted somewhat. 10/26/2022: [name] dorsal foot wound continues to contract and fill with good granulation tissue. [name] heel, once again, has a rim of macerated callus, but [name] undermining and tunneling continues to contract. He has completed his oral antibiotics and has been using [name] [facility] topical compounded antibiotic for his dressing changes at home. He is scheduled to see ENT this afternoon. 11/08/2022: [name] dorsal foot wound is flush with [name] surrounding skin and has a good granulation tissue surface. There is some slough accumulation. As usual, [name] heel has a rim of macerated callus but [name] dimensions are smaller and [name] tunneling and undermining have contracted further. He has resumed his hyperbaric oxygen therapy. 12/12; patient seen for wound evaluation. He is tolerating HBO although we could not dive him yesterday because of relative hypoglycemia and [name] fact he had given himself NovoLog insulin  before he came to clinic. Today his blood sugar is in [name] 180 range she should be fine. He has a large wound on [name] plantar calcaneus on [name] right and a more superficial area on [name] dorsal foot. He is using a scooter for offloading 11/30/2022: [name] dorsal foot wound continues to contract. There is some slough on [name] wound surface. [name] large calcaneus wound has heaped up wet callus around [name] margins. Continued undermining. 12/08/2022: [name] dorsal foot wound is flat and flush with [name] surrounding skin surface. There is some slough present. Once again, [name] calcaneal wound has thick, absolutely macerated callus hanging off in tatters around [name] edges. [name] patient cannot explain to me why this part of his wound gets so wet. There is slough on [name] surface. [name] undermined portion of [name] wound has filled in. 12/17/2022: Earlier this week, he presented for his hyperbaric oxygen therapy and was hypotensive and ill-appearing. He also had what appeared to be an infected sebaceous cyst on his right upper arm. He was sent to [name] emergency department. He was given a fluid bolus and [name] ED provider lanced [name] cyst. He was prescribed doxycycline. He seems to be feeling better today. He notes that he has had copious drainage from his foot. On further questioning, he states that he has not been taking his oral diuretic. [name] dorsal foot wound has expanded somewhat, but is actually more superficial. [name] plantar heel wound is about [name] same. 12/27/2022: [name] dorsal foot wound has epithelialized further. [name] plantar foot wound is about [name] same size but has heaped up macerated callus around [name] perimeter. [name] intake nurse noted a tiny fragment of bone on his dressings and there is now an area at about [name] 5 o'clock position where 1 can palpate bone through a small slit in [name] soft tissues. [name] wound on his arm is contracting but he still likely has cyst wall present due to [name] manner in which [name] infected sebaceous cyst was  addressed in [name] ER. 01/01/2023: [name] wound on his shoulder has contracted considerably. [name] dorsal foot wound has some slough on [name] surface but also looks to be improving. [name] plantar foot wound is basically unchanged. 01/10/2023: [name] wound on his shoulder continues to diminish in size and depth. [name] dorsal foot wound is improving with just  a light layer of slough on [name] surface. [name] plantar foot wound appears to be contracting but still has thick wet callus around [name] perimeter. We did in chamber T cPO2 monitoring during his last hyperbaric oxygen therapy. [name] results show that he has extremely poor tissue perfusion at room air and 1 atm of pressure. He does respond extremely well to hyperbaric oxygen therapy, however. 01/17/2023: [name] wound on his shoulder is down to 0.6 cm in depth, down from 1 cm last week. [name] dorsal foot wound is quite a bit smaller this week with just some light slough on [name] surface. [name] plantar foot wound is also smaller and [name] area of bone that was exposed at [name] posterior heel is now covered. There is still substantial wet callus around [name] perimeter. He is scheduled to undergo formal vascular studies sometime next week. 01/24/2023: [name] wound on his shoulder has almost completely filled in, with just a little bit of depth remaining. [name] dorsal foot wound got macerated; it appears that his home health nurses are applying [name] drape for his heel wound over [name] top of this, meaning that [name] dorsal foot wound is not getting changed as regularly as it is supposed to. There is some slough on [name] surface. [name] plantar foot wound has thick wet callus around [name] perimeter. [name] dimensions measure a little bit smaller. He is having his vascular studies done this afternoon. 02/07/2023: [name] wound on his shoulder is healed. [name] dorsal foot wound continues to be subjected to excessive moisture due to [name] home health nurses applying [name] drape from his wound VAC over [name] top of it. It does measure  smaller, however. There is more bone exposed on his calcaneus, unfortunately. He has heaped up wet callus around [name] edges of [name] heel wound again. He is going to undergo angiography with Dr. Stanford Breed on Friday. Hopefully there will be intervention available to him that may aid in his wound healing. 02/14/2023: He did not get his angiogram last week with Dr. Stanford Breed because he did not have a ride home. [name] dorsal foot wound looks a little bit better with just a light layer of slough. Unfortunately, there is more bone exposed at [name] calcaneus and I am concerned that we are nearing a [name] at which his leg will not be salvageable. 3/18; patient presents for follow-up. He was recently hospitalized on 3/11 for sepsis secondary to diabetic foot wound. He was dishcharged with Augmentin and doxycycline and plan is for 6 weeks of this. He currently has a wound VAC. Wound has declined in appearance. Amputation was recommended in [name] hospital however patient is not ready for this. Patient states he would like to stop HBO. He currently denies systemic signs of infection. 03/07/2023: [name] calcaneal wound continues to deteriorate. There is significant undermining around [name] posterior aspect of [name] calcaneus and bone is palpable all [name] way around. [name] dorsal foot wound has some leathery eschar and slough present. Electronic Signature(s) Signed: 03/07/2023 8:37:14 AM By: Fredirick Maudlin MD FACS Entered By: Fredirick Maudlin on 03/07/2023 08:37:13 [name], [name] ([id number]) [id number] Page 4 of 12 -------------------------------------------------------------------------------- Physical Exam Details Patient Name: Date of Service: TAVI, ULMAN [name] 03/07/2023 7:45 A M Medical Record Number: [id number] Patient Account Number: [id number] Date of Birth/Sex: Treating RN: [birth year] (68 y.o. M) Primary Care Provider: Nelda Bucks Other Clinician: Referring Provider: Treating  Provider/Extender: Doristine Bosworth in Treatment: 25 Constitutional Hypertensive, asymptomatic. . . . no acute distress. Respiratory Normal work  of breathing on room air. Notes 03/07/2023: [name] calcaneal wound continues to deteriorate. There is significant undermining around [name] posterior aspect of [name] calcaneus and bone is palpable all [name] way around. [name] dorsal foot wound has some leathery eschar and slough present. Electronic Signature(s) Signed: 03/07/2023 8:37:47 AM By: Fredirick Maudlin MD FACS Entered By: Fredirick Maudlin on 03/07/2023 08:37:47 -------------------------------------------------------------------------------- Physician Orders Details Patient Name: Date of Service: [place], [name] 03/07/2023 7:45 A M Medical Record Number: [id number] Patient Account Number: [id number] Date of Birth/Sex: Treating RN: 12-Feb-1956 (67 y.o. 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 - Dr. Celine Ahr rm 2 Return A Bathing/ Shower/ Hygiene May shower with protection but do not get wound dressing(s) wet. Protect dressing(s) with water repellant cover (for example, large plastic bag) or a cast cover and may then take shower. Negative Presssure Wound Therapy Discontinue wound vac. Call [name] number on [name] machine for [name] company to pick up. Edema Control - Lymphedema / SCD / Other Left Lower  Extremity Elevate legs to [name] level of [name] heart or above for 30 minutes daily and/or when sitting for 3-4 times a day throughout [name] day. Avoid standing for long periods of time. Off-Loading Wound #1 Left Calcaneus Other: - use knee scooter to ambulate, minimal weight bearing right foot Wound Treatment Wound #1 - Calcaneus Wound Laterality: Left Cleanser: Soap and Water 1 x Per Day/30 Days Discharge Instructions: May shower and wash wound with dial antibacterial soap and water prior to dressing change. COURTNEY, REIFSNYDER [name] ([id number]) [id number] Page 5 of 12 Cleanser: Vashe 5.8 (oz) 1 x Per Day/30 Days Discharge Instructions: Cleanse [name] wound with Vashe prior to applying a clean dressing using gauze sponges, not tissue or cotton balls. Peri-Wound Care: Zinc Oxide Ointment 30g tube 1 x Per Day/30 Days Discharge Instructions: Apply Zinc Oxide to periwound with each dressing change Topical: keystone 1 x Per Day/30 Days Secondary Dressing: Woven Gauze Sponge, Non-Sterile 4x4 in (Generic) 1 x Per Day/30 Days Discharge Instructions: Wet with Vashe we to dry. Secondary Dressing: Zetuvit Plus 4x8 in (Generic) 1 x Per Day/30 Days Discharge Instructions: Apply over primary dressing as directed. Secured With: Coban Self-Adherent Wrap 4x5 (in/yd) (Generic) 1 x Per Day/30 Days Discharge Instructions: Secure with Coban as directed. Secured With: [name] [place] [name], 4.5x3.1 (in/yd) (Generic) 1 x Per Day/30 Days Discharge Instructions: Secure with Kerlix as directed. Wound #2 - Foot Wound Laterality: Dorsal, Left Cleanser: Soap and Water 1 x Per Day/30 Days Discharge Instructions: May shower and wash wound with dial antibacterial soap and water prior to dressing change. Cleanser: Vashe 5.8 (oz) 1 x Per Day/30 Days Discharge Instructions: Cleanse [name] wound with Vashe prior to applying a clean dressing using gauze sponges, not tissue or cotton balls. Peri-Wound Care: keystone 1  x Per Day/30 Days Prim Dressing: Hydrofera Blue Ready Transfer Foam, 2.5x2.5 (in/in) 1 x Per Day/30 Days ary Discharge Instructions: Apply directly to wound bed as directed Prim Dressing: MediHoney Gel, tube 1.5 (oz) 1 x Per Day/30 Days  ary Discharge Instructions: Apply to wound bed as instructed Secondary Dressing: Zetuvit Plus 4x8 in 1 x Per Day/30 Days Discharge Instructions: Apply over primary dressing as directed. Secured With: Coban Self-Adherent Wrap 4x5 (in/yd) 1 x Per Day/30 Days Discharge Instructions: Secure with Coban as directed. Secured With: [name] [place] [name], 4.5x3.1 (in/yd) 1 x Per Day/30 Days Discharge Instructions: Secure with Kerlix as directed. Electronic Signature(s) Signed: 03/07/2023 9:28:06 AM By: Fredirick Maudlin MD FACS Entered By: Fredirick Maudlin on 03/07/2023 08:38:13 -------------------------------------------------------------------------------- Problem List Details Patient Name: Date of Service: [name], [name] [name] 03/07/2023 7:45 A M Medical Record Number: [id number] Patient Account Number: [id number] Date of Birth/Sex: Treating RN: [birth year] (67 y.o. 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 09/10/2022 No Yes L97.523 Non-pressure chronic ulcer of other part of left foot with necrosis of muscle 09/10/2022 No Yes [name], LINKENHOKER ([id number]) [phone].pdf Page 6 of 12 [phone] Other chronic osteomyelitis, left ankle and foot 09/10/2022 No Yes E11.65 Type 2 diabetes mellitus with hyperglycemia 09/10/2022 No Yes E11.621 Type 2 diabetes mellitus with foot ulcer 09/10/2022 No Yes Inactive Problems ICD-10 Code Description Active Date Inactive Date L72.3  Sebaceous cyst 12/17/2022 12/17/2022 Resolved Problems ICD-10 Code Description Active Date Resolved Date L98.492 Non-pressure chronic ulcer of skin of other sites with fat layer exposed 12/17/2022 01/03/2023 Electronic Signature(s) Signed: 03/07/2023 8:35:27 AM By: Fredirick Maudlin MD FACS Entered By: Fredirick Maudlin on 03/07/2023 08:35:27 -------------------------------------------------------------------------------- Progress Note Details Patient Name: Date of Service: [name], [name] 03/07/2023 7:45 A M Medical Record Number: [id number] Patient Account Number: [id number] Date of Birth/Sex: Treating RN: 09/26/1956 (67 y.o. M) Primary Care Provider: Nelda Bucks Other Clinician: Referring Provider: Treating Provider/Extender: Doristine Bosworth in Treatment: 25 Subjective Chief Complaint Information obtained from Patient Patients presents for treatment of an open diabetic ulcer and evaluation for hyperbaric oxygen therapy History of Present Illness (HPI) ADMISSION 09/10/2022 This is a 67 year old poorly controlled type II diabetic (last hemoglobin A1c 10.8%) who has had an ulcer on his heel for over 3 years. He has been seen in multiple wound care centers, including [facility] and [facility]. He reports that at least 3 doctors have recommended that he undergo below-knee amputation. He most recently met with Dr. Catalina Gravel, a vascular surgeon affiliated with [facility] 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 Unfortunately, [name] patient has some extenuating social circumstances including [name] fact that he cares for his wife who has stage IV colon cancer and still works, driving vehicles for [employer]. 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 He was recently hospitalized at [facility] for sepsis and currently has a PICC line through which he  receives IV antibiotics. He reports having had another MRI during that hospital stay along with a chest x-ray and EKG. He apparently contacted one of [name] hyperbaric therapy techs here and asked a number of questions about hyperbaric oxygen treatments. He subsequently self-referred to our center to undergo further evaluation and management. I mention to him that [facility] actually has hyperbaric chambers, but he states that he lives in [place] and this would be more convenient for him given [name] intensive nature of [name] therapy and time requirement.  ABI in clinic today was 0.94. [name] patient actually has 2 wounds. There is a wound on [name] dorsum of his left foot with heavy black eschar and slough present. After debridement, this was demonstrated to involve [name] muscle and [name] extensor tendons are exposed. On his heel, there is essentially a "shark bite" type wound, with much of [name] heel fat pad absent. [name] muscle layer is exposed. There is blue-green staining around [name] perimeter of [name] wound, but no significant odor. He says he has been applying collagen to [name] wound on his heel and Silvadene and Betadine to [name] wound on his dorsal foot. 09/20/2022: [name] heel wound is quite macerated with wet periwound callus. There is slough accumulation on [name] surface. [name] dorsal foot wound looks better this week. There is still exposed tendon, but it is fairly clean with just a little biofilm buildup. We are still working on gathering [name] required documentation to submit for pretreatment review for hyperbaric oxygen therapy. 09/29/2022: [name] dorsal foot wound continues to improve. I do not see any exposed tendon at this [name]. There is just some slough accumulation on [name] wound surface. He continues to have very wet macerated periwound callus on his heel. There is slough on [name] surface, but it is loose and thin. There is an area of undermining at [name] 11 o'clock position, but [name] overlying skin and subcutaneous tissue is  healthy and viable. He has been approved for hyperbaric oxygen therapy and will start treatment tomorrow. BENJMAN, FREMONT [name] ([id number]) [id number] Page 7 of 12 10/06/2022: Continued contraction and improvement of [name] dorsal foot wound. There is just a little bit of slough accumulation on [name] wound surface. [name] periwound callus continues to accumulate on [name] heel wound and it is quite macerated. It is also persistently found with blue-green staining present. He did initiate his hyperbaric oxygen therapy, but has had significant difficulty with decompression. He will be going to ENT to have PE tubes placed. 10/18/2022: His hyperbaric oxygen therapy is on hold while his otological issues are being addressed. He came in again today with [name] periwound skin on both [name] dorsal aspect of his foot and his calcaneus completely macerated. [name] blue-green discoloration, however, has abated and [name] undermining on [name] calcaneus has improved. [name] dorsal foot wound has also contracted somewhat. 10/26/2022: [name] dorsal foot wound continues to contract and fill with good granulation tissue. [name] heel, once again, has a rim of macerated callus, but [name] undermining and tunneling continues to contract. He has completed his oral antibiotics and has been using [name] [facility] topical compounded antibiotic for his dressing changes at home. He is scheduled to see ENT this afternoon. 11/08/2022: [name] dorsal foot wound is flush with [name] surrounding skin and has a good granulation tissue surface. There is some slough accumulation. As usual, [name] heel has a rim of macerated callus but [name] dimensions are smaller and [name] tunneling and undermining have contracted further. He has resumed his hyperbaric oxygen therapy. 12/12; patient seen for wound evaluation. He is tolerating HBO although we could not dive him yesterday because of relative hypoglycemia and [name] fact he had given himself NovoLog insulin before he  came to clinic. Today his blood sugar is in [name] 180 range she should be fine. He has a large wound on [name] plantar calcaneus on [name] right and a more superficial area on [name] dorsal foot. He is using a scooter for offloading 11/30/2022: [name] dorsal foot wound continues to contract. There is some slough on [name]  wound surface. [name] large calcaneus wound has heaped up wet callus around [name] margins. Continued undermining. 12/08/2022: [name] dorsal foot wound is flat and flush with [name] surrounding skin surface. There is some slough present. Once again, [name] calcaneal wound has thick, absolutely macerated callus hanging off in tatters around [name] edges. [name] patient cannot explain to me why this part of his wound gets so wet. There is slough on [name] surface. [name] undermined portion of [name] wound has filled in. 12/17/2022: Earlier this week, he presented for his hyperbaric oxygen therapy and was hypotensive and ill-appearing. He also had what appeared to be an infected sebaceous cyst on his right upper arm. He was sent to [name] emergency department. He was given a fluid bolus and [name] ED provider lanced [name] cyst. He was prescribed doxycycline. He seems to be feeling better today. He notes that he has had copious drainage from his foot. On further questioning, he states that he has not been taking his oral diuretic. [name] dorsal foot wound has expanded somewhat, but is actually more superficial. [name] plantar heel wound is about [name] same. 12/27/2022: [name] dorsal foot wound has epithelialized further. [name] plantar foot wound is about [name] same size but has heaped up macerated callus around [name] perimeter. [name] intake nurse noted a tiny fragment of bone on his dressings and there is now an area at about [name] 5 o'clock position where 1 can palpate bone through a small slit in [name] soft tissues. [name] wound on his arm is contracting but he still likely has cyst wall present due to [name] manner in which [name] infected sebaceous cyst was addressed in  [name] ER. 01/01/2023: [name] wound on his shoulder has contracted considerably. [name] dorsal foot wound has some slough on [name] surface but also looks to be improving. [name] plantar foot wound is basically unchanged. 01/10/2023: [name] wound on his shoulder continues to diminish in size and depth. [name] dorsal foot wound is improving with just a light layer of slough on [name] surface. [name] plantar foot wound appears to be contracting but still has thick wet callus around [name] perimeter. We did in chamber T cPO2 monitoring during his last hyperbaric oxygen therapy. [name] results show that he has extremely poor tissue perfusion at room air and 1 atm of pressure. He does respond extremely well to hyperbaric oxygen therapy, however. 01/17/2023: [name] wound on his shoulder is down to 0.6 cm in depth, down from 1 cm last week. [name] dorsal foot wound is quite a bit smaller this week with just some light slough on [name] surface. [name] plantar foot wound is also smaller and [name] area of bone that was exposed at [name] posterior heel is now covered. There is still substantial wet callus around [name] perimeter. He is scheduled to undergo formal vascular studies sometime next week. 01/24/2023: [name] wound on his shoulder has almost completely filled in, with just a little bit of depth remaining. [name] dorsal foot wound got macerated; it appears that his home health nurses are applying [name] drape for his heel wound over [name] top of this, meaning that [name] dorsal foot wound is not getting changed as regularly as it is supposed to. There is some slough on [name] surface. [name] plantar foot wound has thick wet callus around [name] perimeter. [name] dimensions measure a little bit smaller. He is having his vascular studies done this afternoon. 02/07/2023: [name] wound on his shoulder is healed. [name] dorsal foot wound continues to be subjected to excessive moisture due to  [name] home health nurses applying [name] drape from his wound VAC over [name] top of it. It does measure smaller,  however. There is more bone exposed on his calcaneus, unfortunately. He has heaped up wet callus around [name] edges of [name] heel wound again. He is going to undergo angiography with Dr. Stanford Breed on Friday. Hopefully there will be intervention available to him that may aid in his wound healing. 02/14/2023: He did not get his angiogram last week with Dr. Stanford Breed because he did not have a ride home. [name] dorsal foot wound looks a little bit better with just a light layer of slough. Unfortunately, there is more bone exposed at [name] calcaneus and I am concerned that we are nearing a [name] at which his leg will not be salvageable. 3/18; patient presents for follow-up. He was recently hospitalized on 3/11 for sepsis secondary to diabetic foot wound. He was dishcharged with Augmentin and doxycycline and plan is for 6 weeks of this. He currently has a wound VAC. Wound has declined in appearance. Amputation was recommended in [name] hospital however patient is not ready for this. Patient states he would like to stop HBO. He currently denies systemic signs of infection. 03/07/2023: [name] calcaneal wound continues to deteriorate. There is significant undermining around [name] posterior aspect of [name] calcaneus and bone is palpable all [name] way around. [name] dorsal foot wound has some leathery eschar and slough present. Patient History Family History Cancer - Father,Mother, Diabetes - Mother,Father, Lung Disease - Father, Thyroid Problems - Mother, No family history of Heart Disease, Hereditary Spherocytosis, Hypertension, Kidney Disease, Seizures, Stroke, Tuberculosis. Social History Never smoker, Marital Status - Married, Alcohol Use - Never, Drug Use - No History, Caffeine Use - Daily. Medical History Eyes Patient has history of Cataracts Denies history of Glaucoma, Optic Neuritis Ear/Nose/Mouth/Throat Denies history of Chronic sinus problems/congestion, Middle ear problems Respiratory Patient has history of Sleep  Apnea Cardiovascular Patient has history of Hypertension MADS, LORENC ([id number]) [phone].pdf Page 8 of 12 Gastrointestinal Denies history of Cirrhosis , Colitis, Crohnoos, Hepatitis A, Hepatitis B, Hepatitis C Endocrine Patient has history of Type II Diabetes Immunological Denies history of Lupus Erythematosus, Raynaudoos, Scleroderma Musculoskeletal Patient has history of Osteomyelitis - 2023 Neurologic Patient has history of Neuropathy - Bila lower extremities Oncologic Denies history of Received Chemotherapy, Received Radiation Psychiatric Denies history of Anorexia/bulimia, Confinement Anxiety Hospitalization/Surgery History - I and D Left calcaneus. - back surgery- laminectomy. - eye surgery- Bila cataracts. - shoulder arthroscopy. Medical A Surgical History Notes nd Cardiovascular hyperlipidemia Genitourinary AKI Objective Constitutional Hypertensive, asymptomatic. no acute distress. Vitals Time Taken: 8:00 AM, Height: 74 in, Weight: 245 lbs, BMI: 31.5, Temperature: 98.6 F, Pulse: 97 bpm, Respiratory Rate: 16 breaths/min, Blood Pressure: 169/74 mmHg, Capillary Blood Glucose: 280 mg/dl. Respiratory Normal work of breathing on room air. General Notes: 03/07/2023: [name] calcaneal wound continues to deteriorate. There is significant undermining around [name] posterior aspect of [name] calcaneus and bone is palpable all [name] way around. [name] dorsal foot wound has some leathery eschar and slough present. Integumentary (Hair, Skin) Wound #1 status is Open. Original cause of wound was Pressure Injury. [name] date acquired was: 03/09/2019. [name] wound has been in treatment 25 weeks. [name] wound is located on [name] Left Calcaneus. [name] wound measures 6.2cm length x 4cm width x 0.9cm depth; 19.478cm^2 area and 17.53cm^3 volume. There is bone and Fat Layer (Subcutaneous Tissue) exposed. Tunneling has been noted at 5:00 with a maximum distance of 1.4cm. Undermining begins  at 10:00  and ends at 2:00 with a maximum distance of 2cm. There is a large amount of serosanguineous drainage noted. [name] wound margin is thickened. There is small (1- 33%) pink, pale granulation within [name] wound bed. There is a large (67-100%) amount of necrotic tissue within [name] wound bed including Adherent Slough. [name] periwound skin appearance exhibited: Callus, Maceration, Rubor. [name] periwound skin appearance did not exhibit: Dry/Scaly. Periwound temperature was noted as No Abnormality. Wound #2 status is Open. Original cause of wound was Pressure Injury. [name] date acquired was: 07/09/2022. [name] wound has been in treatment 25 weeks. [name] wound is located on [name] Left,Dorsal Foot. [name] wound measures 3.5cm length x 3.6cm width x 0.1cm depth; 9.896cm^2 area and 0.99cm^3 volume. There is Fat Layer (Subcutaneous Tissue) exposed. There is no tunneling or undermining noted. There is a medium amount of serosanguineous drainage noted. [name] wound margin is flat and intact. There is small (1-33%) red, pink granulation within [name] wound bed. There is a large (67-100%) amount of necrotic tissue within [name] wound bed including Eschar and Adherent Slough. [name] periwound skin appearance had no abnormalities noted for moisture. [name] periwound skin appearance exhibited: Scarring, Rubor. Periwound temperature was noted as No Abnormality. 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 There was a Excisional Skin/Subcutaneous Tissue Debridement with a total area of 24.8 sq cm performed by Fredirick Maudlin, MD. With [name] following instrument(s): Curette to remove Non-Viable tissue/material. Material removed includes Subcutaneous Tissue, Slough, and Skin: Epidermis. No specimens were taken. A time out was conducted at 08:26, prior to [name] start of [name] procedure. A Minimum amount of bleeding was controlled with Pressure. [name] procedure was tolerated well. Post Debridement Measurements: 6.2cm length x 4cm width x 0.9cm depth; 17.53cm^3 volume. Character of Wound/Ulcer Post Debridement is improved. Severity of Tissue Post Debridement is: Bone involvement without necrosis. Post procedure Diagnosis Wound #1: Same as Pre-Procedure General Notes: scribed for Dr. Celine Ahr by Adline Peals, RN. Wound #2 Pre-procedure diagnosis of Wound #2 is a Diabetic Wound/Ulcer of [name] Lower Extremity located on [name] Left,Dorsal Foot .Severity of Tissue Pre Debridement is: Fat layer exposed. There was a Excisional Skin/Subcutaneous Tissue Debridement with a total area of 12.6 sq cm performed by Fredirick Maudlin, MD. With [name] following instrument(s): Curette to remove Non-Viable tissue/material. Material removed includes Eschar, Subcutaneous Tissue, and Slough. No specimens were taken. A time out was conducted at 08:26, prior to [name] start of [name] procedure. A Minimum amount of bleeding was controlled with Pressure. [name] procedure was tolerated well. Post Debridement Measurements: 3.5cm length x 3.6cm width x 0.1cm depth; 0.99cm^3 volume. Character of Wound/Ulcer Post Debridement is improved. Severity of Tissue Post Debridement is: Fat layer exposed. Post procedure Diagnosis Wound #2: Same as Pre-Procedure General Notes: scribed for Dr. Celine Ahr by Adline Peals, RN. Plan Follow-up Appointments: Return Appointment in 1 week. - Dr. Celine Ahr rm 2 Bathing/ Shower/ Hygiene: May shower with  protection but do not get wound dressing(s) wet. Protect dressing(s) with water repellant cover (for example, large plastic bag) or  a cast cover and may then take shower. Negative Presssure Wound Therapy: Discontinue wound vac. Call [name] number on [name] machine for [name] company to pick up. Edema Control - Lymphedema / SCD / Other: Elevate legs to [name] level of [name] heart or above for 30 minutes daily and/or when sitting for 3-4 times a day throughout [name] day. Avoid standing for long periods of time. Off-Loading: Wound #1 Left Calcaneus: Other: - use knee scooter to ambulate, minimal weight bearing right foot WOUND #1: - Calcaneus Wound Laterality: Left Cleanser: Soap and Water 1 x Per Day/30 Days Discharge Instructions: May shower and wash wound with dial antibacterial soap and water prior to dressing change. Cleanser: Vashe 5.8 (oz) 1 x Per Day/30 Days Discharge Instructions: Cleanse [name] wound with Vashe prior to applying a clean dressing using gauze sponges, not tissue or cotton balls. Peri-Wound Care: Zinc Oxide Ointment 30g tube 1 x Per Day/30 Days Discharge Instructions: Apply Zinc Oxide to periwound with each dressing change Topical: keystone 1 x Per Day/30 Days Secondary Dressing: Woven Gauze Sponge, Non-Sterile 4x4 in (Generic) 1 x Per Day/30 Days Discharge Instructions: Wet with Vashe we to dry. Secondary Dressing: Zetuvit Plus 4x8 in (Generic) 1 x Per Day/30 Days Discharge Instructions: Apply over primary dressing as directed. Secured With: Coban Self-Adherent Wrap 4x5 (in/yd) (Generic) 1 x Per Day/30 Days Discharge Instructions: Secure with Coban as directed. Secured With: [name] [name], 4.5x3.1 (in/yd) (Generic) 1 x Per Day/30 Days Discharge Instructions: Secure with Kerlix as directed. WOUND #2: - Foot Wound Laterality: Dorsal, Left Cleanser: Soap and Water 1 x Per Day/30 Days Discharge Instructions: May shower and wash wound with dial antibacterial soap and water prior to  dressing change. Cleanser: Vashe 5.8 (oz) 1 x Per Day/30 Days Discharge Instructions: Cleanse [name] wound with Vashe prior to applying a clean dressing using gauze sponges, not tissue or cotton balls. Peri-Wound Care: keystone 1 x Per Day/30 Days Prim Dressing: Hydrofera Blue Ready Transfer Foam, 2.5x2.5 (in/in) 1 x Per Day/30 Days ary Discharge Instructions: Apply directly to wound bed as directed Prim Dressing: MediHoney Gel, tube 1.5 (oz) 1 x Per Day/30 Days ary Discharge Instructions: Apply to wound bed as instructed Secondary Dressing: Zetuvit Plus 4x8 in 1 x Per Day/30 Days Discharge Instructions: Apply over primary dressing as directed. Secured With: Coban Self-Adherent Wrap 4x5 (in/yd) 1 x Per Day/30 Days Discharge Instructions: Secure with Coban as directed. Secured With: [name] [place] [name], 4.5x3.1 (in/yd) 1 x Per Day/30 Days Discharge Instructions: Secure with Kerlix as directed. 03/07/2023: [name] calcaneal wound continues to deteriorate. There is significant undermining around [name] posterior aspect of [name] calcaneus and bone is palpable all [name] way around. [name] dorsal foot wound has some leathery eschar and slough present. I used a curette to debride eschar, slough, and subcutaneous tissue from [name] [name] dorsal foot wound and skin, slough, and subcutaneous tissue from [name] calcaneal wound. We will continue Medihoney and Hydrofera Blue to [name] dorsal foot wound and Vashe-moistened gauze packing to [name] heel wound. He continues on oral doxycycline and Augmentin per infectious disease. At this [name], despite all efforts to try and salvage his limb, I think we have reached [name] where amputation is inevitable. I think [name] patient finally understands this and is in [name] process of working on arrangements to manage care for his wife and dog while he undergoes surgery and rehabilitation. We will continue to manage his wounds while he is in [name] process of all of this. Follow-up in  1  week. Electronic Signature(s) [name], [name] ([id number]) [id number] Page 10 of 12 Signed: 03/07/2023 8:44:31 AM By: Fredirick Maudlin MD FACS Entered By: Fredirick Maudlin on 03/07/2023 08:44:31 -------------------------------------------------------------------------------- HxROS Details Patient Name: Date of Service: [name], [name] 03/07/2023 7:45 A M Medical Record Number: [id number] Patient Account Number: [id number] Date of Birth/Sex: Treating RN: 1956-03-19 (67 y.o. M) Primary Care Provider: Nelda Bucks Other Clinician: Referring Provider: Treating Provider/Extender: Doristine Bosworth in Treatment: 25 Eyes Medical History: Positive for: Cataracts Negative for: Glaucoma; Optic Neuritis Ear/Nose/Mouth/Throat Medical History: Negative for: Chronic sinus problems/congestion; Middle ear problems Respiratory Medical History: Positive for: Sleep Apnea Cardiovascular Medical History: Positive for: Hypertension Past Medical History Notes: hyperlipidemia Gastrointestinal Medical History: Negative for: Cirrhosis ; Colitis; Crohns; Hepatitis A; Hepatitis B; Hepatitis C Endocrine Medical History: Positive for: Type II Diabetes Time with diabetes: 20 years Treated with: Insulin Blood sugar tested every day: Yes Tested : 2-3 Genitourinary Medical History: Past Medical History Notes: AKI Immunological Medical History: Negative for: Lupus Erythematosus; Raynauds; Scleroderma Musculoskeletal Medical History: Positive for: Osteomyelitis - 2023 Neurologic Medical History: Positive for: Neuropathy - Bila lower extremities Oncologic Medical History: Negative for: Received Chemotherapy; Received Radiation JAI, [name] ([id number]) [id number] Page 11 of 12 Psychiatric Medical History: Negative for: Anorexia/bulimia; Confinement Anxiety HBO Extended History  Items Eyes: Cataracts Immunizations Pneumococcal Vaccine: Received Pneumococcal Vaccination: Yes Received Pneumococcal Vaccination On or After 60th Birthday: Yes Implantable Devices Yes Hospitalization / Surgery History Type of Hospitalization/Surgery I and D Left calcaneus back surgery- laminectomy eye surgery- Bila cataracts shoulder arthroscopy Family and Social History Cancer: Yes - Father,Mother; Diabetes: Yes - Mother,Father; Heart Disease: No; Hereditary Spherocytosis: No; Hypertension: No; Kidney Disease: No; Lung Disease: Yes - Father; Seizures: No; Stroke: No; Thyroid Problems: Yes - Mother; Tuberculosis: No; Never smoker; Marital Status - Married; Alcohol Use: Never; Drug Use: No History; Caffeine Use: Daily; Financial Concerns: No; Food, Clothing or Shelter Needs: No; Support System Lacking: No; Transportation Concerns: No Electronic Signature(s) Signed: 03/07/2023 9:28:06 AM By: Fredirick Maudlin MD FACS Entered By: Fredirick Maudlin on 03/07/2023 08:37:20 -------------------------------------------------------------------------------- SuperBill Details Patient Name: Date of Service: [name], [name] 03/07/2023 Medical Record Number: [id number] Patient Account Number: [id number] Date of Birth/Sex: Treating RN: [birth year] (67 y.o. 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 L97.523 Non-pressure chronic ulcer of other part of left foot with necrosis of 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, left ankle and foot E11.65 Type 2 diabetes mellitus with hyperglycemia E11.621 Type 2 diabetes mellitus with foot ulcer Facility Procedures : [name] patient participates with Medicare or their insurance follows [name] Medicare  Facility Guidelines: CPT4 Code Description Modifier Quantity [id number] - DEB SUBQ TISSUE 20 SQ CM/< 1 ICD-10 Diagnosis Description L97.424 Non-pressure chronic ulcer of  left heel and midfoot with necrosis of bone L97.523 Non-pressure chronic ulcer of other part of left foot with necrosis of muscle Physician Procedures : CPT4 Code Description Modifier [id number] - WC PHYS LEVEL 4 - EST PT 25 ICD-10 Diagnosis Description 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, 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 Quantity: 1 : [id number] -  WC PHYS SUBQ TISS 20 SQ CM ICD-10 Diagnosis Description 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

## 2023-03-08 ENCOUNTER — Telehealth (HOSPITAL_COMMUNITY): Payer: Self-pay | Admitting: Licensed Clinical Social Worker

## 2023-03-08 ENCOUNTER — Telehealth: Payer: Self-pay | Admitting: *Deleted

## 2023-03-08 DIAGNOSIS — I10 Essential (primary) hypertension: Secondary | ICD-10-CM

## 2023-03-08 NOTE — Progress Notes (Signed)
  Care Coordination   Note   03/08/2023 Name: [name] MRN: [id number] DOB: 1956-06-11  [name] is a 67 y.o. year old male who sees Nicoletta Dress, MD for primary care. I reached out to Lewayne Bunting by phone today to offer care coordination services.  [name] was given information about Care Coordination services today including:   The Care Coordination services include support from the care team which includes your Nurse Coordinator, Clinical Social Worker, or Pharmacist.  The Care Coordination team is here to help remove barriers to the health concerns and goals most important to you. Care Coordination services are voluntary, and the patient may decline or stop services at any time by request to their care team member.   Care Coordination Consent Status: Patient agreed to services and verbal consent obtained.   Follow up plan:  Telephone appointment with care coordination team member scheduled for:  03/15/2023  Encounter Outcome:  Pt. Scheduled from referral   Julian Hy, [place] Direct Dial: [phone]

## 2023-03-08 NOTE — Telephone Encounter (Signed)
[facility] Care Navigation CSW Progress Note  Clinical Social Worker contacted patient by phone to discuss concerns of caregiver stress.  Patient is participating in a Managed Medicaid Plan:  No Patient is a 67 yo married male. He reports he has been trying to care for himself and avoid having a BKA. He has an appointment next week at [facility] to discuss possible surgery and next steps. He shares concerns as his wife has stage 4 colon cancer and spends most of her day in bed. Apparently, she has a bad fall a year ago which she injured her neck and was hospitalized for quite sometime along with a rehab stay. The fall limited her mobility and she uses a w/c to get around. He states she is able to transfer from bed to wheelchair, complete personal care independently and toilet herself. He is concerned that if he needs to be hospitalized for surgery and rehab stay that she will be home alone during that time. He also has a dog who "is like a child to me" and shared concerns about care for the dog.   CSW discussed possible options for inpatient respite stay at [facility] for wife while he is hospitalized although insurance will most likely not pay and he would have to use private resources for stay. He shared that his wife did not like SNF placement when she had the neck injury and would never agree to any respite stay. He states they do not have the financial resources to assist with payment for private pay services to come in the home.  CSW will explore further some resources to assist patient although was very honest that the options are very limited with resources. CSW will also reach out to [facility] for any further insight to possible resources for patient and wife.Raquel Sarna, LCSW, CCSW-MCS [phone]   SDOH Screenings   Food Insecurity: No Food Insecurity (02/22/2023)  Housing: Low Risk  (02/22/2023)  Transportation Needs: No Transportation Needs (02/22/2023)  Utilities: Not At Risk (02/22/2023)   Financial Resource Strain: High Risk (03/08/2023)  Tobacco Use: Low Risk  (02/21/2023)

## 2023-03-14 ENCOUNTER — Encounter (HOSPITAL_BASED_OUTPATIENT_CLINIC_OR_DEPARTMENT_OTHER): Payer: Medicare Other | Attending: General Surgery | Admitting: General Surgery

## 2023-03-14 DIAGNOSIS — L97524 Non-pressure chronic ulcer of other part of left foot with necrosis of bone: Secondary | ICD-10-CM | POA: Insufficient documentation

## 2023-03-14 DIAGNOSIS — Z833 Family history of diabetes mellitus: Secondary | ICD-10-CM | POA: Insufficient documentation

## 2023-03-14 DIAGNOSIS — L97423 Non-pressure chronic ulcer of left heel and midfoot with necrosis of muscle: Secondary | ICD-10-CM | POA: Diagnosis not present

## 2023-03-14 DIAGNOSIS — E11621 Type 2 diabetes mellitus with foot ulcer: Secondary | ICD-10-CM | POA: Insufficient documentation

## 2023-03-14 DIAGNOSIS — E785 Hyperlipidemia, unspecified: Secondary | ICD-10-CM | POA: Insufficient documentation

## 2023-03-14 DIAGNOSIS — M86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, left ankle and foot: Secondary | ICD-10-CM | POA: Diagnosis not present

## 2023-03-14 DIAGNOSIS — I10 Essential (primary) hypertension: Secondary | ICD-10-CM | POA: Diagnosis not present

## 2023-03-14 DIAGNOSIS — E1140 Type 2 diabetes mellitus with diabetic neuropathy, unspecified: Secondary | ICD-10-CM | POA: Diagnosis not present

## 2023-03-15 ENCOUNTER — Ambulatory Visit: Payer: Self-pay | Admitting: *Deleted

## 2023-03-15 NOTE — Progress Notes (Signed)
[name], [name] ([id number]) [id number] Page 1 of 8 Visit Report for 03/14/2023 Arrival Information Details Patient Name: Date of Service: [name]. 03/14/2023 8:15 A M Medical Record Number: [id number] Patient Account Number: [id number] Date of Birth/Sex: Treating RN: 16-Feb-1956 (67 y.o. [name], [name] Primary Care Lakecia Deschamps: Nelda Bucks Other Clinician: Referring Jimma Ortman: Treating Dalaysia Harms/Extender: Doristine Bosworth in Treatment: 58 Visit Information History Since Last Visit All ordered tests and consults were completed: No Patient Arrived: Knee Scooter Added or deleted any medications: No Arrival Time: 08:13 Any new allergies or adverse reactions: No Accompanied By: self Had a fall or experienced change in No Transfer Assistance: None activities of daily living that may affect Patient Identification Verified: Yes risk of falls: Secondary Verification Process Completed: Yes Signs or symptoms of abuse/neglect since last visito No Patient Requires Transmission-Based Precautions: No Hospitalized since last visit: No Patient Has Alerts: No Implantable device outside of the clinic excluding No cellular tissue based products placed in the center since last visit: Has Dressing in Place as Prescribed: Yes Pain Present Now: No Electronic Signature(s) Signed: 03/14/2023 4:59:51 PM By: Baruch Gouty RN, BSN Entered By: Baruch Gouty on 03/14/2023 08:39:17 -------------------------------------------------------------------------------- Encounter Discharge Information Details Patient Name: Date of Service: [name]. 03/14/2023 8:15 A M Medical Record Number: [id number] Patient Account Number: [id number] Date of Birth/Sex: Treating RN: May 10, 1956 (67 y.o. [name] Primary Care Dianna Ewald: Nelda Bucks Other Clinician: Referring Fizza Scales: Treating Antavious Spanos/Extender: Doristine Bosworth in Treatment: 26 Encounter Discharge Information Items Post Procedure Vitals Discharge Condition: Stable Temperature (F): 99 Ambulatory Status: Knee Scooter Pulse (bpm): 83 Discharge Destination: Home Respiratory Rate (breaths/min): 18 Transportation: Private Auto Blood Pressure (mmHg): 166/80 Accompanied By: self Schedule Follow-up Appointment: Yes Clinical Summary of Care: Patient Declined Electronic Signature(s) Signed: 03/14/2023 4:59:51 PM By: Baruch Gouty RN, BSN Entered By: Baruch Gouty on 03/14/2023 09:25:42 -------------------------------------------------------------------------------- Lower Extremity Assessment Details Patient Name: Date of Service: [name]. 03/14/2023 8:15 A M Medical Record Number: [id number] Patient Account Number: [id number] Date of Birth/Sex: Treating RN: 06-05-1956 (67 y.o. [name] Primary Care Hevin Jeffcoat: Nelda Bucks Other Clinician: Referring Arris Meyn: Treating Sae Handrich/Extender: Doristine Bosworth in Treatment: 26 Edema Assessment S[Left: [name]: [id number] Page 2 of 8] Assessed: [Left: No] [Right: No] Edema: [Left: Ye] [Right: s] Calf Left: Right: Point of Measurement: From Medial Instep 46 cm Ankle Left: Right: Point of Measurement: From Medial Instep 27 cm Knee To Floor Left: Right: From Medial Instep 45 cm Vascular Assessment Pulses: Dorsalis Pedis Palpable: [Left:No] Electronic Signature(s) Signed: 03/14/2023 4:59:51 PM By: Baruch Gouty RN, BSN Entered By: Baruch Gouty on 03/14/2023 09:22:45 -------------------------------------------------------------------------------- Multi Wound Chart Details Patient Name: Date of Service: [name]. 03/14/2023 8:15 A M Medical Record Number: [id number] Patient Account Number: [id number] Date of Birth/Sex: Treating RN: 1956-12-03 (67 y.o. [name],  [name] Primary Care Paydon Carll: Nelda Bucks Other Clinician: Referring Gelisa Tieken: Treating Lisabeth Mian/Extender: Doristine Bosworth in Treatment: 26 Vital Signs Height(in): 74 Capillary Blood Glucose(mg/dl): 228 Weight(lbs): 245 Pulse(bpm): 51 Body Mass Index(BMI): 31.5 Blood Pressure(mmHg): 166/80 Temperature(F): 99.0 Respiratory Rate(breaths/min): 20 [1:Photos: No Photos Left Calcaneus Wound Location: Pressure Injury Wounding Event: Diabetic Wound/Ulcer of the Lower Primary Etiology: Extremity Cataracts, Sleep Apnea, Hypertension, Cataracts, Sleep Apnea, Hypertension, N/A Comorbid History: Type II Diabetes,  Osteomyelitis, Neuropathy 03/09/2019 Date Acquired: 26 Weeks of Treatment: Open Wound Status: No Wound Recurrence: 6x3.9x1.9 Measurements L  x W x D (cm) 18.378 A (cm) : rea 34.919 Volume (cm) : 14.90% % Reduction in A rea: -61.70% % Reduction in Volume:  Grade 3 Classification: Large Exudate A mount: Serosanguineous Exudate Type: red, brown Exudate Color: Thickened Wound Margin: Large (67-100%) Granulation A mount: Pink, Pale Granulation Quality: Small (1-33%) Necrotic A mount: Adherent Slough Necrotic  Tissue: Fat Layer (Subcutaneous Tissue): Yes Fat Layer (Subcutaneous Tissue): Yes N/A Exposed Structures: Bone: Yes Fascia: No] [2:No Photos Left, Dorsal Foot Pressure Injury Diabetic Wound/Ulcer of the Lower Extremity Type II Diabetes, Osteomyelitis,  Neuropathy 07/09/2022 26 Open No 3.8x3.5x0.1 10.446 1.045 -10.80% -10.90% Grade 2 Medium Serosanguineous red, brown Flat and Intact Small (1-33%) Red, Pink Large (67-100%) Eschar, Adherent Slough Fascia: No Tendon: No] [N/A:N/A N/A N/A N/A N/A N/A N/A N/A  N/A N/A N/A N/A N/A N/A N/A N/A N/A N/A N/A N/A N/A N/A] [name], [name] ([id number]) [1:Tendon: No Muscle: No Joint: No None Epithelialization: Debridement - Excisional Debridement: Pre-procedure Verification/Time Out 09:00 Taken: Callus, Subcutaneous, Slough Tissue  Debrided: Skin/Subcutaneous Tissue Level: 23.4 Debridement A (sq cm):  rea Curette Instrument: Minimum Bleeding: Pressure Hemostasis A chieved: 0 Procedural Pain: 0 Post Procedural Pain: Procedure was tolerated well Debridement Treatment Response: 6x3.9x1.9 Post Debridement Measurements L x W x D (cm) 34.919 Post  Debridement Volume: (cm) Callus: Yes Periwound Skin Texture: Maceration: Yes Periwound Skin Moisture: Dry/Scaly: No Erythema: Yes Periwound Skin Color: Rubor: No No Abnormality Temperature: Debridement Procedures Performed:] [2:Muscle: No Joint: No  Bone: No Small (1-33%) Debridement - Excisional 09:00 Subcutaneous, Slough Skin/Subcutaneous Tissue 13.3 Curette Minimum Pressure 0 0 Procedure was tolerated well 3.8x3.5x0.1 1.045 Scarring: Yes Dry/Scaly: Yes Maceration: No No Abnormality Debridement]  [N/A:[id number] Page 3 of 8 N/A N/A N/A N/A N/A N/A N/A N/A N/A N/A N/A N/A N/A N/A N/A N/A N/A N/A N/A] Treatment Notes Electronic Signature(s) Signed: 03/14/2023 9:10:35 AM By: Fredirick Maudlin MD FACS Signed: 03/14/2023 4:59:51 PM By: Baruch Gouty RN, BSN Entered By: Fredirick Maudlin on 03/14/2023 09:10:34 -------------------------------------------------------------------------------- Multi-Disciplinary Care Plan Details Patient Name: Date of Service: [place], [name] 03/14/2023 8:15 A M Medical Record Number: [id number] Patient Account Number: [id number] Date of Birth/Sex: Treating RN: 1956/08/13 (67 y.o. [name] Primary Care Rhealynn Myhre: Nelda Bucks Other Clinician: Referring Deetra Booton: Treating Donatello Kleve/Extender: Doristine Bosworth in Treatment: [place] reviewed with physician Active Inactive Wound/Skin Impairment Nursing Diagnoses: Impaired tissue integrity Goals: Patient/caregiver will verbalize understanding of skin care regimen Date Initiated: 10/06/2022 Target Resolution Date:  04/11/2023 Goal Status: Active Ulcer/skin breakdown will have a volume reduction of 30% by week 4 Date Initiated: 09/10/2022 Date Inactivated: 10/06/2022 Target Resolution Date: 10/08/2022 Goal Status: Unmet Unmet Reason: osteo, HBOT Ulcer/skin breakdown will have a volume reduction of 50% by week 8 Date Initiated: 10/06/2022 Date Inactivated: 02/14/2023 Target Resolution Date: 01/14/2023 Unmet Reason: insufficient perfusion Goal Status: Unmet to Left lower extremity Ulcer/skin breakdown will have a volume reduction of 80% by week 12 Date Initiated: 02/14/2023 Date Inactivated: 03/14/2023 Target Resolution Date: 03/15/2023 Goal Status: Unmet Unmet Reason: osteomyelitis Interventions: Assess ulceration(s) every visit Provide education on ulcer and skin care Treatment Activities: Consult for HBO : 09/10/2022 [name], [name] ([id number]) [id number].pdf Page 4 of 8 Skin care regimen initiated : 09/10/2022 Notes: NPWT started 12/27/22 Electronic Signature(s) Signed: 03/14/2023 4:59:51 PM By: Baruch Gouty RN, BSN Entered By: Baruch Gouty on 03/14/2023 08:51:43 -------------------------------------------------------------------------------- Pain Assessment Details Patient Name: Date of Service: [place], [name] 03/14/2023 8:15 A M  Medical Record Number: [id number] Patient Account Number: [id number] Date of Birth/Sex: Treating RN: 04-04-1956 (67 y.o. [name] Primary Care Garron Eline: Nelda Bucks Other Clinician: Referring Mariel Gaudin: Treating Adair Lauderback/Extender: Doristine Bosworth in Treatment: 26 Active Problems Location of Pain Severity and Description of Pain Patient Has Paino No Site Locations Rate the pain. Current Pain Level: 0 Pain Management and Medication Current Pain Management: Electronic Signature(s) Signed: 03/14/2023 4:59:51 PM By: Baruch Gouty RN, BSN Entered By: Baruch Gouty on 03/14/2023  08:39:59 -------------------------------------------------------------------------------- Patient/Caregiver Education Details Patient Name: Date of Service: [place] [name] 4/1/2024andnbsp8:15 A M Medical Record Number: [id number] Patient Account Number: [id number] Date of Birth/Gender: Treating RN: 1956/02/27 (67 y.o. [name] Primary Care Physician: Nelda Bucks Other Clinician: Referring Physician: Treating Physician/Extender: Doristine Bosworth in Treatment: 26 Education Assessment Education Provided To: Patient Education Topics Provided Elevated Blood Sugar/ Impact on Healing: Methods: Explain/Verbal [name], [name] ([id number]) [id number] Page 5 of 8 Responses: Reinforcements needed, State content correctly Infection: Methods: Explain/Verbal Responses: Reinforcements needed, State content correctly Offloading: Methods: Explain/Verbal Responses: Reinforcements needed, State content correctly Wound/Skin Impairment: Methods: Explain/Verbal Responses: Reinforcements needed, State content correctly Electronic Signature(s) Signed: 03/14/2023 4:59:51 PM By: Baruch Gouty RN, BSN Entered By: Baruch Gouty on 03/14/2023 08:52:57 -------------------------------------------------------------------------------- Wound Assessment Details Patient Name: Date of Service: [place], [name] 03/14/2023 8:15 A M Medical Record Number: [id number] Patient Account Number: [id number] Date of Birth/Sex: Treating RN: [birth year] (67 y.o. [name] Primary Care Destenie Ingber: Nelda Bucks Other Clinician: Referring Kalie Cabral: Treating Tenika Keeran/Extender: Doristine Bosworth in Treatment: 26 Wound Status Wound Number: 1 Primary Diabetic Wound/Ulcer of the Lower Extremity Etiology: Wound Location: Left Calcaneus Wound Open Wounding Event: Pressure Injury Status: Date Acquired: 03/09/2019 Comorbid  Cataracts, Sleep Apnea, Hypertension, Type II Diabetes, Weeks Of Treatment: 26 History: Osteomyelitis, Neuropathy Clustered Wound: No Wound Measurements Length: (cm) 6 Width: (cm) 3.9 Depth: (cm) 1.9 Area: (cm) 18.378 Volume: (cm) 34.919 % Reduction in Area: 14.9% % Reduction in Volume: -61.7% Epithelialization: None Tunneling: No Undermining: No Wound Description Classification: Grade 3 Wound Margin: Thickened Exudate Amount: Large Exudate Type: Serosanguineous Exudate Color: red, brown Foul Odor After Cleansing: No Slough/Fibrino Yes Wound Bed Granulation Amount: Large (67-100%) Exposed Structure Granulation Quality: Pink, Pale Fascia Exposed: No Necrotic Amount: Small (1-33%) Fat Layer (Subcutaneous Tissue) Exposed: Yes Necrotic Quality: Adherent Slough Tendon Exposed: No Muscle Exposed: No Joint Exposed: No Bone Exposed: Yes Periwound Skin Texture Texture Color No Abnormalities Noted: No No Abnormalities Noted: No Callus: Yes Erythema: Yes Rubor: No Moisture No Abnormalities Noted: No Temperature / Pain Dry / Scaly: No Temperature: No Abnormality Maceration: Yes Treatment Notes [name], [name] ([id number]) [id number] Page 6 of 8 Wound #1 (Calcaneus) Wound Laterality: Left Cleanser Soap and Water Discharge Instruction: May shower and wash wound with dial antibacterial soap and water prior to dressing change. Vashe 5.8 (oz) Discharge Instruction: Cleanse the wound with Vashe prior to applying a clean dressing using gauze sponges, not tissue or cotton balls. Peri-Wound Care Zinc Oxide Ointment 30g tube Discharge Instruction: Apply Zinc Oxide to periwound with each dressing change Topical keystone Primary Dressing Secondary Dressing Woven Gauze Sponge, Non-Sterile 4x4 in Discharge Instruction: Wet with Vashe we to dry. Zetuvit Plus 4x8 in Discharge Instruction: Apply over primary dressing as directed. Secured With [employer] 4x5 (in/yd) Discharge Instruction: Secure with Coban as directed. Kerlix Roll Sterile, 4.5x3.1 (in/yd) Discharge Instruction: Secure with Kerlix as directed. Compression Wrap Compression Stockings  Add-Ons Electronic Signature(s) Signed: 03/14/2023 4:59:51 PM By: Baruch Gouty RN, BSN Entered By: Baruch Gouty on 03/14/2023 08:46:40 -------------------------------------------------------------------------------- Wound Assessment Details Patient Name: Date of Service: [place], [name] 03/14/2023 8:15 A M Medical Record Number: [id number] Patient Account Number: [id number] Date of Birth/Sex: Treating RN: 27-Sep-1956 (67 y.o. [name] Primary Care Serena Petterson: Nelda Bucks Other Clinician: Referring Rubee Vega: Treating Ron Junco/Extender: Doristine Bosworth in Treatment: 26 Wound Status Wound Number: 2 Primary Diabetic Wound/Ulcer of the Lower Extremity Etiology: Wound Location: Left, Dorsal Foot Wound Open Wounding Event: Pressure Injury Status: Date Acquired: 07/09/2022 Comorbid Cataracts, Sleep Apnea, Hypertension, Type II Diabetes, Weeks Of Treatment: 26 History: Osteomyelitis, Neuropathy Clustered Wound: No Wound Measurements Length: (cm) 3.8 Width: (cm) 3.5 Depth: (cm) 0.1 Area: (cm) 10.446 Volume: (cm) 1.045 % Reduction in Area: -10.8% % Reduction in Volume: -10.9% Epithelialization: Small (1-33%) Tunneling: No Undermining: No Wound Description Classification: Grade 2 Wound Margin: Flat and Intact Exudate Amount: Medium Exudate Type: Serosanguineous Exudate Color: red, brown [name], [name] ([id number]) Wound Bed Granulation Amount: Small (1-33%) Granulation Quality: Red, Pink Necrotic Amount: Large (67-100%) Necrotic Quality: Eschar, Adherent Slough Foul Odor After Cleansing: No Slough/Fibrino Yes [phone].pdf Page 7 of 8 Exposed Structure Fascia Exposed: No Fat Layer  (Subcutaneous Tissue) Exposed: Yes Tendon Exposed: No Muscle Exposed: No Joint Exposed: No Bone Exposed: No Periwound Skin Texture Texture Color No Abnormalities Noted: Yes No Abnormalities Noted: Yes Moisture Temperature / Pain No Abnormalities Noted: No Temperature: No Abnormality Dry / Scaly: Yes Maceration: No Treatment Notes Wound #2 (Foot) Wound Laterality: Dorsal, Left Cleanser Soap and Water Discharge Instruction: May shower and wash wound with dial antibacterial soap and water prior to dressing change. Vashe 5.8 (oz) Discharge Instruction: Cleanse the wound with Vashe prior to applying a clean dressing using gauze sponges, not tissue or cotton balls. Peri-Wound Care keystone Topical Primary Dressing Hydrofera Blue Ready Transfer Foam, 2.5x2.5 (in/in) Discharge Instruction: Apply directly to wound bed as directed MediHoney Gel, tube 1.5 (oz) Discharge Instruction: Apply to wound bed as instructed Secondary Dressing Zetuvit Plus 4x8 in Discharge Instruction: Apply over primary dressing as directed. Secured With [employer] 4x5 (in/yd) Discharge Instruction: Secure with Coban as directed. Kerlix Roll Sterile, 4.5x3.1 (in/yd) Discharge Instruction: Secure with Kerlix as directed. Compression Wrap Compression Stockings Circaid Juxta Lite Compression Wrap Quantity: 1 Left Leg Compression Amount: 30-40 mmHg Discharge Instruction: Apply Circaid Juxta Lite Compression Wrap daily as instructed. Apply first thing in the morning, remove at night before bed. Add-Ons Electronic Signature(s) Signed: 03/14/2023 4:59:51 PM By: Baruch Gouty RN, BSN Entered By: Baruch Gouty on 03/14/2023 08:50:40 -------------------------------------------------------------------------------- [place] Details Patient Name: Date of Service: [name]. 03/14/2023 8:15 A M Medical Record Number: [id number] Patient Account Number: [id number] Date of Birth/Sex: Treating  RN: 08-Oct-1956 (67 y.o. [name] Primary Care [name]: Nelda Bucks Other Clinician: Referring Zayla Agar: Treating Bhavya Grand/Extender: Doristine Bosworth in Treatment: [address], [place] ([id number]) [id number] Page 8 of 8 Vital Signs Time Taken: 08:14 Temperature (F): 99.0 Height (in): 74 Pulse (bpm): 83 Weight (lbs): 245 Respiratory Rate (breaths/min): 20 Body Mass Index (BMI): 31.5 Blood Pressure (mmHg): 166/80 Capillary Blood Glucose (mg/dl): 228 Reference Range: 80 - 120 mg / dl Notes glucose per pt report this am Electronic Signature(s) Signed: 03/14/2023 4:59:51 PM By: Baruch Gouty RN, BSN Entered By: Baruch Gouty on 03/14/2023 08:39:40

## 2023-03-15 NOTE — Progress Notes (Addendum)
[name], [name] ([id number]) [id number] Page 1 of 12 Visit Report for 03/14/2023 Chief Complaint Document Details Patient Name: Date of Service: [name]. 03/14/2023 8:15 A M Medical Record Number: [id number] Patient Account Number: [id number] Date of Birth/Sex: Treating RN: October 14, 1956 (67 y.o. [name] Primary Care Provider: Nelda [name] Other Clinician: Referring Provider: Treating Provider/Extender: [name] in Treatment: 26 Information Obtained from: Patient Chief Complaint Patients presents for treatment of an open diabetic ulcer and evaluation for hyperbaric oxygen therapy Electronic Signature(s) Signed: 03/14/2023 9:10:43 AM By: [name] Maudlin MD FACS Entered By: [name] on 03/14/2023 09:10:43 -------------------------------------------------------------------------------- Debridement Details Patient Name: Date of Service: [name]. 03/14/2023 8:15 A M Medical Record Number: [id number] Patient Account Number: [id number] Date of Birth/Sex: Treating RN: [birth year] (67 y.o. [name] Primary Care Provider: Nelda [name] Other Clinician: Referring Provider: Treating Provider/Extender: [name] in Treatment: 26 Debridement Performed for Assessment: Wound #2 Left,Dorsal Foot Performed By: Physician [name] Maudlin, MD Debridement Type: Debridement Severity of Tissue Pre Debridement: Fat layer exposed Level of Consciousness (Pre-procedure): Awake and Alert Pre-procedure Verification/Time Out Yes - 09:00 Taken: Start Time: 09:02 T Area Debrided (L x W): otal 3.8 (cm) x 3.5 (cm) = 13.3 (cm) Tissue and other material debrided: Viable, Non-Viable, Slough, Subcutaneous, Slough Level: Skin/Subcutaneous Tissue Debridement Description: Excisional Instrument: Curette Bleeding: Minimum Hemostasis Achieved: Pressure Procedural Pain: 0 Post  Procedural Pain: 0 Response to Treatment: Procedure was tolerated well Level of Consciousness (Post- Awake and Alert procedure): Post Debridement Measurements of Total Wound Length: (cm) 3.8 Width: (cm) 3.5 Depth: (cm) 0.1 Volume: (cm) 1.045 Character of Wound/Ulcer Post Debridement: Requires Further Debridement Severity of Tissue Post Debridement: Fat layer exposed Post Procedure Diagnosis Same as Pre-procedure Notes scribed for Dr. Celine [name] by [name] Gouty, RN Electronic Signature(s) Signed: 03/14/2023 9:35:45 AM By: [name] Maudlin MD FACS Signed: 03/14/2023 4:59:51 PM By: [name] Gouty RN, BSN [place], [place] [name] ([id number]) [phone].pdf Page 2 of 12 Entered By: [name] on 03/14/2023 09:05:16 -------------------------------------------------------------------------------- Debridement Details Patient Name: Date of Service: [name]. 03/14/2023 8:15 A M Medical Record Number: [id number] Patient Account Number: [id number] Date of Birth/Sex: Treating RN: 1956/04/18 (67 y.o. [name] Primary Care Provider: Nelda [name] Other Clinician: Referring Provider: Treating Provider/Extender: [name] in Treatment: 26 Debridement Performed for Assessment: Wound #1 Left Calcaneus Performed By: Physician [name] Maudlin, MD Debridement Type: Debridement Severity of Tissue Pre Debridement: Necrosis of bone Level of Consciousness (Pre-procedure): Awake and Alert Pre-procedure Verification/Time Out Yes - 09:00 Taken: Start Time: 09:02 T Area Debrided (L x W): otal 6 (cm) x 3.9 (cm) = 23.4 (cm) Tissue and other material debrided: Viable, Non-Viable, Callus, Slough, Subcutaneous, Skin: Epidermis, Slough Level: Skin/Subcutaneous Tissue Debridement Description: Excisional Instrument: Curette Bleeding: Minimum Hemostasis Achieved: Pressure Procedural Pain: 0 Post Procedural Pain: 0 Response to Treatment:  Procedure was tolerated well Level of Consciousness (Post- Awake and Alert procedure): Post Debridement Measurements of Total Wound Length: (cm) 6 Width: (cm) 3.9 Depth: (cm) 1.9 Volume: (cm) 34.919 Character of Wound/Ulcer Post Debridement: Requires Further Debridement Severity of Tissue Post Debridement: Necrosis of bone Post Procedure Diagnosis Same as Pre-procedure Notes scribed for Dr. Celine [name] by [name] Gouty, RN Electronic Signature(s) Signed: 03/14/2023 9:35:45 AM By: [name] Maudlin MD FACS Signed: 03/14/2023 4:59:51 PM By: [name] Gouty RN, BSN Entered By: [name] on 03/14/2023 09:07:46 -------------------------------------------------------------------------------- HPI Details Patient Name: Date of Service: [name] Julian, [name] LD  [name]. 03/14/2023 8:15 A M Medical Record Number: [id number] Patient Account Number: [id number] Date of Birth/Sex: Treating RN: 01-29-1956 (67 y.o. [name] Primary Care Provider: Nelda [name] Other Clinician: Referring Provider: Treating Provider/Extender: [name] in Treatment: 26 History of Present Illness HPI Description: ADMISSION 09/10/2022 This is a 67 year old poorly controlled type II diabetic (last hemoglobin A1c 10.8%) who has had an ulcer on his heel for over 3 years. He has been seen in multiple wound care centers, including [name] and [name]. He reports that at least 3 doctors have recommended that he undergo below-knee amputation. He most recently met with Dr. Catalina [name], a vascular surgeon affiliated with [name]. 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 Unfortunately, the patient has some extenuating social circumstances including the fact that he cares for his wife who has stage IV colon cancer and still works, driving vehicles for [name]. 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 He was recently hospitalized at [name] for sepsis and currently has a PICC line through which he receives IV [name] ([id number]) [id number] Page 3 of 12 antibiotics. He reports having had another MRI during that [name] stay along with a chest x-ray and EKG. He apparently contacted one of the hyperbaric therapy techs here and asked a number of questions about hyperbaric oxygen treatments. He subsequently self-referred to our [name] to undergo further evaluation and management. I [name] to him that [name] actually has hyperbaric chambers, but he states that he lives in [place] and this would be more convenient for him given the intensive nature of the therapy and time requirement. ABI in clinic today was 0.94. The patient actually has 2 wounds. There is a wound on the dorsum of his left foot with heavy black eschar and slough present. After debridement, this was demonstrated to involve the muscle and the extensor tendons are exposed. On his heel, there is essentially a "shark bite" type wound, with much of the heel fat pad absent. The muscle layer is exposed. There is blue-green staining around the perimeter of the wound, but no significant odor. He says he has been applying collagen to the wound on his heel and Silvadene and Betadine to the wound on his dorsal foot. 09/20/2022: The heel wound is quite macerated with wet periwound callus. There is slough accumulation on the surface. The dorsal foot wound looks better this week. There is still exposed tendon, but it is fairly clean with just a little biofilm buildup. We are still working on gathering the required documentation to submit for pretreatment review for hyperbaric oxygen therapy. 09/29/2022: The dorsal foot wound continues to improve. I do not see any exposed tendon at this point. There is just some slough accumulation on the wound surface. He  continues to have very wet macerated periwound callus on his heel. There is slough on the surface, but it is loose and thin. There is an area of undermining at the 11 o'clock position, but the overlying skin and subcutaneous tissue is healthy and viable. He has been approved for hyperbaric oxygen therapy and will start treatment tomorrow. 10/06/2022: Continued contraction and improvement of the dorsal foot wound. There is just a little bit of slough accumulation on the wound surface. The periwound callus continues to accumulate on the heel wound and it is quite macerated. It is also persistently found with blue-green staining present.  He did initiate his hyperbaric oxygen therapy, but has had significant difficulty with decompression. He will be going to ENT to have PE tubes placed. 10/18/2022: His hyperbaric oxygen therapy is on hold while his otological issues are being addressed. He came in again today with the periwound skin on both the dorsal aspect of his foot and his calcaneus completely macerated. The blue-green discoloration, however, has abated and the undermining on the calcaneus has improved. The dorsal foot wound has also contracted somewhat. 10/26/2022: The dorsal foot wound continues to contract and fill with good granulation tissue. The heel, once again, has a rim of macerated callus, but the undermining and tunneling continues to contract. He has completed his oral antibiotics and has been using the [facility] topical compounded antibiotic for his dressing changes at home. He is scheduled to see ENT this afternoon. 11/08/2022: The dorsal foot wound is flush with the surrounding skin and has a good granulation tissue surface. There is some slough accumulation. As usual, the heel has a rim of macerated callus but the dimensions are smaller and the tunneling and undermining have contracted further. He has resumed his hyperbaric oxygen therapy. 12/12; patient seen for wound evaluation. He is  tolerating HBO although we could not dive him yesterday because of relative hypoglycemia and the fact he had given himself NovoLog insulin before he came to clinic. Today his blood sugar is in the 180 range she should be fine. He has a large wound on the plantar calcaneus on the right and a more superficial area on the dorsal foot. He is using a scooter for offloading 11/30/2022: The dorsal foot wound continues to contract. There is some slough on the wound surface. The large calcaneus wound has heaped up wet callus around the margins. Continued undermining. 12/08/2022: The dorsal foot wound is flat and flush with the surrounding skin surface. There is some slough present. Once again, the calcaneal wound has thick, absolutely macerated callus hanging off in tatters around the edges. The patient cannot explain to me why this part of his wound gets so wet. There is slough on the surface. The undermined portion of the wound has filled in. 12/17/2022: Earlier this week, he presented for his hyperbaric oxygen therapy and was hypotensive and ill-appearing. He also had what appeared to be an infected sebaceous cyst on his right upper arm. He was sent to the emergency department. He was given a fluid bolus and the ED provider lanced the cyst. He was prescribed doxycycline. He seems to be feeling better today. He notes that he has had copious drainage from his foot. On further questioning, he states that he has not been taking his oral diuretic. The dorsal foot wound has expanded somewhat, but is actually more superficial. The plantar heel wound is about the same. 12/27/2022: The dorsal foot wound has epithelialized further. The plantar foot wound is about the same size but has heaped up macerated callus around the perimeter. The intake nurse noted a tiny fragment of bone on his dressings and there is now an area at about the 5 o'clock position where 1 can palpate bone through a small slit in the soft tissues.  The wound on his arm is contracting but he still likely has cyst wall present due to the manner in which the infected sebaceous cyst was addressed in the ER. 01/01/2023: The wound on his shoulder has contracted considerably. The dorsal foot wound has some slough on the surface but also looks to be improving. The plantar  foot wound is basically unchanged. 01/10/2023: The wound on his shoulder continues to diminish in size and depth. The dorsal foot wound is improving with just a light layer of slough on the surface. The plantar foot wound appears to be contracting but still has thick wet callus around the perimeter. We did in chamber T cPO2 monitoring during his last hyperbaric oxygen therapy. The results show that he has extremely poor tissue perfusion at room air and 1 atm of pressure. He does respond extremely well to hyperbaric oxygen therapy, however. 01/17/2023: The wound on his shoulder is down to 0.6 cm in depth, down from 1 cm last week. The dorsal foot wound is quite a bit smaller this week with just some light slough on the surface. The plantar foot wound is also smaller and the area of bone that was exposed at the posterior heel is now covered. There is still substantial wet callus around the perimeter. He is scheduled to undergo formal vascular studies sometime next week. 01/24/2023: The wound on his shoulder has almost completely filled in, with just a little bit of depth remaining. The dorsal foot wound got macerated; it appears that his home health nurses are applying the drape for his heel wound over the top of this, meaning that the dorsal foot wound is not getting changed as regularly as it is supposed to. There is some slough on the surface. The plantar foot wound has thick wet callus around the perimeter. The dimensions measure a little bit smaller. He is having his vascular studies done this afternoon. 02/07/2023: The wound on his shoulder is healed. The dorsal foot wound continues to  be subjected to excessive moisture due to the home health nurses applying the drape from his wound VAC over the top of it. It does measure smaller, however. There is more bone exposed on his calcaneus, unfortunately. He has heaped up wet callus around the edges of the heel wound again. He is going to undergo angiography with Dr. Stanford Breed on Friday. Hopefully there will be intervention available to him that may aid in his wound healing. 02/14/2023: He did not get his angiogram last week with Dr. Stanford Breed because he did not have a ride home. The dorsal foot wound looks a little bit better with just a light layer of slough. Unfortunately, there is more bone exposed at the calcaneus and I am concerned that we are nearing a point at which his leg will not be salvageable. 3/18; patient presents for follow-up. He was recently hospitalized on 3/11 for sepsis secondary to diabetic foot wound. He was dishcharged with Augmentin and doxycycline and plan is for 6 weeks of this. He currently has a wound VAC. Wound has declined in appearance. Amputation was recommended in the [name] however patient is not ready for this. Patient states he would like to stop HBO. He currently denies systemic signs of infection. 03/07/2023: The calcaneal wound continues to deteriorate. There is significant undermining around the posterior aspect of the calcaneus and bone is palpable all the way around. The dorsal foot wound has some leathery eschar and slough present. 03/14/2023: The dorsal foot wound is somewhat leathery with some dark eschar and slough present. The calcaneal wound is deteriorating further. There are [name], [name] ([id number]) [id number] Page 4 of 12 fragments of the bone hanging loose. He does have an appointment with Dr. Stanford Breed next week on Tuesday to discuss amputation. Electronic Signature(s) Signed: 03/14/2023 9:11:33 AM By: [name] Maudlin MD FACS Entered By: [name]  on  03/14/2023 09:11:32 -------------------------------------------------------------------------------- Physical Exam Details Patient Name: Date of Service: [place], [name] 03/14/2023 8:15 A M Medical Record Number: [id number] Patient Account Number: [id number] Date of Birth/Sex: Treating RN: Nov 02, 1956 (67 y.o. [name] Primary Care Provider: Nelda [name] Other Clinician: Referring Provider: Treating Provider/Extender: [name] in Treatment: 26 Constitutional Hypertensive, asymptomatic. . . . no acute distress. Respiratory Normal work of breathing on room air. Notes 03/14/2023: The dorsal foot wound is somewhat leathery with some dark eschar and slough present. The calcaneal wound is deteriorating further. There are fragments of the bone hanging loose. Electronic Signature(s) Signed: 03/14/2023 9:12:04 AM By: [name] Maudlin MD FACS Entered By: [name] on 03/14/2023 09:12:03 -------------------------------------------------------------------------------- Physician Orders Details Patient Name: Date of Service: [place], [name] 03/14/2023 8:15 A M Medical Record Number: [id number] Patient Account Number: [id number] Date of Birth/Sex: Treating RN: [birth year] (67 y.o. 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 - Dr. Celine [name] rm 2 Return A Bathing/ Shower/  Hygiene May shower with protection but do not get wound dressing(s) wet. Protect dressing(s) with water repellant cover (for example, large plastic bag) or a cast cover and may then take shower. Edema Control - Lymphedema / SCD / Other Left Lower Extremity Elevate legs to the level of the heart or above for 30 minutes daily and/or when sitting for 3-4 times a day throughout the day. Avoid standing for long periods of time. Moisturize legs daily. Compression stocking or Garment 30-40 mm/Hg pressure to: - left leg daily Off-Loading Wound #1 Left Calcaneus Other: - use knee scooter to ambulate, minimal weight bearing right foot [name], [name] ([id number]) [id number] Page 5 of 12 Wound Treatment Wound #1 - Calcaneus Wound Laterality: Left Cleanser: Soap and Water 1 x Per Day/30 Days Discharge Instructions: May shower and wash wound with dial antibacterial soap and water prior to dressing change. Cleanser: Vashe 5.8 (oz) 1 x Per Day/30 Days Discharge Instructions: Cleanse the wound with Vashe prior to applying a clean dressing using gauze sponges, not tissue or cotton balls. Peri-Wound Care: Zinc Oxide Ointment 30g tube 1 x Per Day/30 Days Discharge Instructions: Apply Zinc Oxide to periwound with each dressing change Topical: keystone 1 x Per Day/30 Days Secondary Dressing: Woven Gauze Sponge, Non-Sterile 4x4 in (Generic) 1 x Per Day/30 Days Discharge Instructions: Wet with Vashe we to dry. Secondary Dressing: Zetuvit Plus 4x8 in (DME) (Dispense As Written) 1 x Per Day/30 Days Discharge Instructions: Apply over primary dressing as directed. Secured With: Coban Self-Adherent Wrap 4x5 (in/yd) (Generic) 1 x Per Day/30 Days Discharge Instructions: Secure with Coban as directed. Secured With: [employer], 4.5x3.1 (in/yd) (Generic) 1 x Per Day/30 Days Discharge Instructions: Secure with Kerlix as directed. Wound #2 - Foot Wound Laterality: Dorsal,  Left Cleanser: Soap and Water 1 x Per Day/30 Days Discharge Instructions: May shower and wash wound with dial antibacterial soap and water prior to dressing change. Cleanser: Vashe 5.8 (oz) 1 x Per Day/30 Days Discharge Instructions:  Cleanse the wound with Vashe prior to applying a clean dressing using gauze sponges, not tissue or cotton balls. Peri-Wound Care: keystone 1 x Per Day/30 Days Prim Dressing: Hydrofera Blue Ready Transfer Foam, 2.5x2.5 (in/in) (DME) (Dispense As Written) 1 x Per Day/30 Days ary Discharge Instructions: Apply directly to wound bed as directed Prim Dressing: MediHoney Gel, tube 1.5 (oz) 1 x Per Day/30 Days ary Discharge Instructions: Apply to wound bed as instructed Secondary Dressing: Zetuvit Plus 4x8 in 1 x Per Day/30 Days Discharge Instructions: Apply over primary dressing as directed. Secured With: Coban Self-Adherent Wrap 4x5 (in/yd) 1 x Per Day/30 Days Discharge Instructions: Secure with Coban as directed. Secured With: [employer], 4.5x3.1 (in/yd) 1 x Per Day/30 Days Discharge Instructions: Secure with Kerlix as directed. Compression Stockings: Circaid Juxta Lite Compression Wrap (DME) Left Leg Compression Amount: 30-40 mmHG Discharge Instructions: Apply Circaid Juxta Lite Compression Wrap daily as instructed. Apply first thing in the morning, remove at night before bed. Electronic Signature(s) Signed: 03/14/2023 9:35:45 AM By: [name] Maudlin MD FACS Signed: 03/14/2023 4:59:51 PM By: [name] Gouty RN, BSN Entered By: [name] on 03/14/2023 09:23:59 -------------------------------------------------------------------------------- Problem List Details Patient Name: Date of Service: [place], [name] 03/14/2023 8:15 A M Medical Record Number: [id number] Patient Account Number: [id number] Date of Birth/Sex: Treating RN: 05-30-1956 (67 y.o. 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, left ankle and foot 09/10/2022 No Yes E11.65 Type 2 diabetes mellitus with hyperglycemia 09/10/2022 No Yes E11.621 Type 2 diabetes mellitus with foot ulcer 09/10/2022 No Yes Inactive Problems ICD-10 Code Description Active Date Inactive Date L72.3 Sebaceous cyst 12/17/2022 12/17/2022 Resolved Problems ICD-10 Code Description Active Date Resolved Date L98.492 Non-pressure chronic ulcer of skin of other sites with fat layer exposed 12/17/2022 01/03/2023 Electronic Signature(s) Signed: 03/14/2023 9:10:28 AM By: [name] Maudlin MD FACS Entered By: [name] on 03/14/2023 09:10:28 -------------------------------------------------------------------------------- Progress Note Details Patient Name: Date of Service: [name]. 03/14/2023 8:15 A M Medical Record Number: [id number] Patient Account Number: [id number] Date of Birth/Sex: Treating RN: 06/05/1956 (67 y.o. [name] Primary Care Provider: Nelda [name] Other Clinician: Referring Provider: Treating Provider/Extender: [name] in Treatment: 26 Subjective Chief Complaint Information obtained from Patient Patients presents for treatment of an open diabetic ulcer and evaluation for hyperbaric oxygen therapy History of Present Illness (HPI) ADMISSION 09/10/2022 This is a 67 year old poorly controlled type II diabetic (last hemoglobin A1c 10.8%) who has had an ulcer on his heel for over 3 years. He has been seen in multiple  wound care centers, including [name] and [name]. He reports that at least 3 doctors have recommended that he undergo below-knee amputation. He most recently met with Dr. Catalina [name], a vascular surgeon affiliated with [name]. 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 Unfortunately, the patient has some extenuating social circumstances including the fact that he cares for his wife who has stage IV colon cancer and still works, driving vehicles for [name]. He has had at  least 1 MRI that demonstrates osteomyelitis of the calcaneus. He was recently hospitalized at [facility] for sepsis and currently has a PICC line through which he receives IV antibiotics. He reports having had another MRI during that [name] stay along with a chest x-ray and EKG. He apparently contacted one of the hyperbaric therapy techs here and asked a number of questions about hyperbaric oxygen treatments. He subsequently self-referred to our [name] to undergo further evaluation and management. I [name] to him that [name] actually has hyperbaric chambers, but he states that he lives in [place] and this would be more convenient for him given the intensive nature of the therapy and time requirement. ABI in clinic today was 0.94. [name], [name] ([id number]) [id number] Page 7 of 12 The patient actually has 2 wounds. There is a wound on the dorsum of his left foot with heavy black eschar and slough present. After debridement, this was demonstrated to involve the muscle and the extensor tendons are exposed. On his heel, there is essentially a "shark bite" type wound, with much of the heel fat pad absent. The muscle layer is exposed. There is blue-green staining around the perimeter of the wound, but no significant odor. He says he has been applying collagen to the wound on his heel and Silvadene and  Betadine to the wound on his dorsal foot. 09/20/2022: The heel wound is quite macerated with wet periwound callus. There is slough accumulation on the surface. The dorsal foot wound looks better this week. There is still exposed tendon, but it is fairly clean with just a little biofilm buildup. We are still working on gathering the required documentation to submit for pretreatment review for hyperbaric oxygen therapy. 09/29/2022: The dorsal foot wound continues to improve. I do not see any exposed tendon at this point. There is just some slough accumulation on the wound surface. He continues to have very wet macerated periwound callus on his heel. There is slough on the surface, but it is loose and thin. There is an area of undermining at the 11 o'clock position, but the overlying skin and subcutaneous tissue is healthy and viable. He has been approved for hyperbaric oxygen therapy and will start treatment tomorrow. 10/06/2022: Continued contraction and improvement of the dorsal foot wound. There is just a little bit of slough accumulation on the wound surface. The periwound callus continues to accumulate on the heel wound and it is quite macerated. It is also persistently found with blue-green staining present. He did initiate his hyperbaric oxygen therapy, but has had significant difficulty with decompression. He will be going to ENT to have PE tubes placed. 10/18/2022: His hyperbaric oxygen therapy is on hold while his otological issues are being addressed. He came in again today with the periwound skin on both the dorsal aspect of his foot and his calcaneus completely macerated. The blue-green discoloration, however, has abated and the undermining on the calcaneus has improved. The dorsal foot wound has also contracted somewhat. 10/26/2022: The dorsal foot wound continues to contract and fill with good granulation tissue. The heel, once again, has a rim of macerated callus, but the undermining and  tunneling continues to contract. He has completed his oral antibiotics and has been using the [name] topical compounded antibiotic for his dressing changes at home. He is scheduled to see ENT this afternoon. 11/08/2022: The dorsal foot wound is flush with the surrounding skin and has a good granulation tissue surface. There is some slough accumulation. As usual, the heel  has a rim of macerated callus but the dimensions are smaller and the tunneling and undermining have contracted further. He has resumed his hyperbaric oxygen therapy. 12/12; patient seen for wound evaluation. He is tolerating HBO although we could not dive him yesterday because of relative hypoglycemia and the fact he had given himself NovoLog insulin before he came to clinic. Today his blood sugar is in the 180 range she should be fine. He has a large wound on the plantar calcaneus on the right and a more superficial area on the dorsal foot. He is using a scooter for offloading 11/30/2022: The dorsal foot wound continues to contract. There is some slough on the wound surface. The large calcaneus wound has heaped up wet callus around the margins. Continued undermining. 12/08/2022: The dorsal foot wound is flat and flush with the surrounding skin surface. There is some slough present. Once again, the calcaneal wound has thick, absolutely macerated callus hanging off in tatters around the edges. The patient cannot explain to me why this part of his wound gets so wet. There is slough on the surface. The undermined portion of the wound has filled in. 12/17/2022: Earlier this week, he presented for his hyperbaric oxygen therapy and was hypotensive and ill-appearing. He also had what appeared to be an infected sebaceous cyst on his right upper arm. He was sent to the emergency department. He was given a fluid bolus and the ED provider lanced the cyst. He was prescribed doxycycline. He seems to be feeling better today. He notes that he has  had copious drainage from his foot. On further questioning, he states that he has not been taking his oral diuretic. The dorsal foot wound has expanded somewhat, but is actually more superficial. The plantar heel wound is about the same. 12/27/2022: The dorsal foot wound has epithelialized further. The plantar foot wound is about the same size but has heaped up macerated callus around the perimeter. The intake nurse noted a tiny fragment of bone on his dressings and there is now an area at about the 5 o'clock position where 1 can palpate bone through a small slit in the soft tissues. The wound on his arm is contracting but he still likely has cyst wall present due to the manner in which the infected sebaceous cyst was addressed in the ER. 01/01/2023: The wound on his shoulder has contracted considerably. The dorsal foot wound has some slough on the surface but also looks to be improving. The plantar foot wound is basically unchanged. 01/10/2023: The wound on his shoulder continues to diminish in size and depth. The dorsal foot wound is improving with just a light layer of slough on the surface. The plantar foot wound appears to be contracting but still has thick wet callus around the perimeter. We did in chamber T cPO2 monitoring during his last hyperbaric oxygen therapy. The results show that he has extremely poor tissue perfusion at room air and 1 atm of pressure. He does respond extremely well to hyperbaric oxygen therapy, however. 01/17/2023: The wound on his shoulder is down to 0.6 cm in depth, down from 1 cm last week. The dorsal foot wound is quite a bit smaller this week with just some light slough on the surface. The plantar foot wound is also smaller and the area of bone that was exposed at the posterior heel is now covered. There is still substantial wet callus around the perimeter. He is scheduled to undergo formal vascular studies sometime next week. 01/24/2023:  The wound on his shoulder has  almost completely filled in, with just a little bit of depth remaining. The dorsal foot wound got macerated; it appears that his home health nurses are applying the drape for his heel wound over the top of this, meaning that the dorsal foot wound is not getting changed as regularly as it is supposed to. There is some slough on the surface. The plantar foot wound has thick wet callus around the perimeter. The dimensions measure a little bit smaller. He is having his vascular studies done this afternoon. 02/07/2023: The wound on his shoulder is healed. The dorsal foot wound continues to be subjected to excessive moisture due to the home health nurses applying the drape from his wound VAC over the top of it. It does measure smaller, however. There is more bone exposed on his calcaneus, unfortunately. He has heaped up wet callus around the edges of the heel wound again. He is going to undergo angiography with Dr. Stanford Breed on Friday. Hopefully there will be intervention available to him that may aid in his wound healing. 02/14/2023: He did not get his angiogram last week with Dr. Stanford Breed because he did not have a ride home. The dorsal foot wound looks a little bit better with just a light layer of slough. Unfortunately, there is more bone exposed at the calcaneus and I am concerned that we are nearing a point at which his leg will not be salvageable. 3/18; patient presents for follow-up. He was recently hospitalized on 3/11 for sepsis secondary to diabetic foot wound. He was dishcharged with Augmentin and doxycycline and plan is for 6 weeks of this. He currently has a wound VAC. Wound has declined in appearance. Amputation was recommended in the [name] however patient is not ready for this. Patient states he would like to stop HBO. He currently denies systemic signs of infection. 03/07/2023: The calcaneal wound continues to deteriorate. There is significant undermining around the posterior aspect of the  calcaneus and bone is palpable all the way around. The dorsal foot wound has some leathery eschar and slough present. 03/14/2023: The dorsal foot wound is somewhat leathery with some dark eschar and slough present. The calcaneal wound is deteriorating further. There are fragments of the bone hanging loose. He does have an appointment with Dr. Stanford Breed next week on Tuesday to discuss amputation. Patient History Family History [name], [name] ([id number]) [id number] Page 8 of 41 Cancer - Father,Mother, Diabetes - Mother,Father, Lung Disease - Father, Thyroid Problems - Mother, No family history of Heart Disease, Hereditary Spherocytosis, Hypertension, Kidney Disease, Seizures, Stroke, Tuberculosis. Social History Never smoker, Marital Status - Married, Alcohol Use - Never, Drug Use - No History, Caffeine Use - Daily. Medical History Eyes Patient has history of Cataracts Denies history of Glaucoma, Optic Neuritis Ear/Nose/Mouth/Throat Denies history of Chronic sinus problems/congestion, Middle ear problems Respiratory Patient has history of Sleep Apnea Cardiovascular Patient has history of Hypertension Gastrointestinal Denies history of Cirrhosis , Colitis, Crohnoos, Hepatitis A, Hepatitis B, Hepatitis C Endocrine Patient has history of Type II Diabetes Immunological Denies history of Lupus Erythematosus, Raynaudoos, Scleroderma Musculoskeletal Patient has history of Osteomyelitis - 2023 Neurologic Patient has history of Neuropathy - Bila lower extremities Oncologic Denies history of Received Chemotherapy, Received Radiation Psychiatric Denies history of Anorexia/bulimia, Confinement Anxiety Hospitalization/Surgery History - I and D Left calcaneus. - back surgery- laminectomy. - eye surgery- Bila cataracts. - shoulder arthroscopy. Medical A Surgical History  Notes nd Cardiovascular hyperlipidemia Genitourinary AKI Objective Constitutional Hypertensive,  asymptomatic. no acute distress. Vitals Time Taken: 8:14 AM, Height: 74 in, Weight: 245 lbs, BMI: 31.5, Temperature: 99.0 F, Pulse: 83 bpm, Respiratory Rate: 20 breaths/min, Blood Pressure: 166/80 mmHg, Capillary Blood Glucose: 228 mg/dl. General Notes: glucose per pt report this am Respiratory Normal work of breathing on room air. General Notes: 03/14/2023: The dorsal foot wound is somewhat leathery with some dark eschar and slough present. The calcaneal wound is deteriorating further. There are fragments of the bone hanging loose. Integumentary (Hair, Skin) Wound #1 status is Open. Original cause of wound was Pressure Injury. The date acquired was: 03/09/2019. The wound has been in treatment 26 weeks. The wound is located on the Left Calcaneus. The wound measures 6cm length x 3.9cm width x 1.9cm depth; 18.378cm^2 area and 34.919cm^3 volume. There is bone and Fat Layer (Subcutaneous Tissue) exposed. There is no tunneling or undermining noted. There is a large amount of serosanguineous drainage noted. The wound margin is thickened. There is large (67-100%) pink, pale granulation within the wound bed. There is a small (1-33%) amount of necrotic tissue within the wound bed including Adherent Slough. The periwound skin appearance exhibited: Callus, Maceration, Erythema. The periwound skin appearance did not exhibit: Dry/Scaly, Rubor. The surrounding wound skin color is noted with erythema. Periwound temperature was noted as No Abnormality. Wound #2 status is Open. Original cause of wound was Pressure Injury. The date acquired was: 07/09/2022. The wound has been in treatment 26 weeks. The wound is located on the Left,Dorsal Foot. The wound measures 3.8cm length x 3.5cm width x 0.1cm depth; 10.446cm^2 area and 1.045cm^3 volume. There is Fat Layer (Subcutaneous Tissue) exposed. There is no tunneling or  undermining noted. There is a medium amount of serosanguineous drainage noted. The wound margin is flat and intact. There is small (1-33%) red, pink granulation within the wound bed. There is a large (67-100%) amount of necrotic tissue within the wound bed including Eschar and Adherent Slough. The periwound skin appearance had no abnormalities noted for texture. The periwound skin appearance had no abnormalities noted for color. The periwound skin appearance exhibited: Dry/Scaly. The periwound skin appearance did not exhibit: Maceration. Periwound temperature was noted as No Abnormality. 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 There was a Excisional Skin/Subcutaneous Tissue Debridement with a total area of 23.4 sq cm performed by [name], MD. With the following instrument(s): Curette to remove Viable and Non-Viable tissue/material. Material removed includes Callus, Subcutaneous Tissue, Slough, and Skin: Epidermis. No specimens were taken. A time out was conducted at 09:00, prior to the start of the procedure. A Minimum amount of bleeding was controlled with Pressure. The procedure was tolerated well with a pain level of 0 throughout and a pain level of 0 following the procedure. Post Debridement Measurements: 6cm length x 3.9cm width x 1.9cm depth; 34.919cm^3 volume. Character of Wound/Ulcer Post Debridement requires further debridement.  Severity of Tissue Post Debridement is: Necrosis of bone. Post procedure Diagnosis Wound #1: Same as Pre-Procedure General Notes: scribed for Dr. Lady Gary by Zenaida Deed,  RN. 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 There was a Excisional Skin/Subcutaneous Tissue Debridement with a total area of 13.3 sq cm performed by [name], MD. With the following instrument(s): Curette to remove Viable and Non-Viable tissue/material. Material removed includes Subcutaneous Tissue and Slough and. No specimens were taken. A time out was conducted at 09:00, prior to the start of the procedure. A Minimum amount of bleeding was controlled with Pressure. The procedure was tolerated well with a pain level of 0 throughout and a pain level of 0 following the procedure. Post Debridement Measurements: 3.8cm length x 3.5cm width x 0.1cm depth; 1.045cm^3 volume. Character of Wound/Ulcer Post Debridement requires further debridement. Severity of Tissue Post Debridement is: Fat layer exposed. Post procedure Diagnosis Wound #2: Same as Pre-Procedure General Notes: scribed for Dr. Lady Gary by Zenaida Deed, RN. Plan Follow-up Appointments: Return Appointment in 1 week. - Dr. Lady Gary rm 2 Bathing/ Shower/ Hygiene: May shower with protection but do not get wound dressing(s) wet. Protect dressing(s) with water repellant cover (for example, large plastic bag) or a cast cover and may then take shower. Edema Control - Lymphedema / SCD / Other: Elevate legs to the level of the heart or above for 30 minutes daily and/or when sitting for 3-4 times a day throughout the day. Avoid standing for long periods of time. Moisturize legs daily. Compression stocking or Garment 30-40 mm/Hg pressure to: - left leg daily Off-Loading: Wound #1 Left Calcaneus: Other: - use knee scooter to  ambulate, minimal weight bearing right foot WOUND #1: - Calcaneus Wound Laterality: Left Cleanser: Soap and Water 1 x Per Day/30 Days Discharge Instructions: May shower and wash wound with dial antibacterial soap and water prior to dressing change. Cleanser: Vashe 5.8 (oz) 1 x Per Day/30 Days Discharge Instructions: Cleanse the wound with Vashe prior to applying a clean dressing using gauze sponges, not tissue or cotton balls. Peri-Wound Care: Zinc Oxide Ointment 30g tube 1 x Per Day/30 Days Discharge Instructions: Apply Zinc Oxide to periwound with each dressing change Topical: keystone 1 x Per Day/30 Days Secondary Dressing: Woven Gauze Sponge, Non-Sterile 4x4 in (Generic) 1 x Per Day/30 Days Discharge Instructions: Wet with Vashe we to dry. Secondary Dressing: Zetuvit Plus 4x8 in (DME) (Dispense As Written) 1 x Per Day/30 Days Discharge Instructions: Apply over primary dressing as directed. Secured With: Coban Self-Adherent Wrap 4x5 (in/yd) (Generic) 1 x Per Day/30 Days Discharge Instructions: Secure with Coban as directed. Secured With: [employer], 4.5x3.1 (in/yd) (Generic) 1 x Per Day/30 Days Discharge Instructions: Secure with Kerlix as directed. WOUND #2: - Foot Wound Laterality: Dorsal, Left Cleanser: Soap and Water 1 x Per Day/30 Days Discharge Instructions: May shower and wash wound with dial antibacterial soap and water prior to dressing change. Cleanser: Vashe 5.8 (oz) 1 x Per Day/30 Days Discharge Instructions: Cleanse the wound with Vashe prior to applying a clean dressing using gauze sponges, not tissue or cotton balls. Peri-Wound Care: keystone 1 x Per Day/30 Days Prim Dressing: Hydrofera Blue Ready Transfer Foam, 2.5x2.5 (in/in) (DME) (Dispense As Written) 1 x Per Day/30 Days ary Discharge Instructions: Apply directly to wound bed as directed Prim Dressing: MediHoney Gel, tube 1.5 (oz) 1 x Per Day/30 Days ary Discharge Instructions: Apply to wound bed as  instructed Secondary Dressing: Zetuvit Plus 4x8 in 1 x Per Day/30 Days Discharge Instructions: Apply over  primary dressing as directed. Secured With: Coban Self-Adherent Wrap 4x5 (in/yd) 1 x Per Day/30 Days Discharge Instructions: Secure with Coban as directed. Secured With: [employer], 4.5x3.1 (in/yd) 1 x Per Day/30 Days Discharge Instructions: Secure with Kerlix as directed. Com pression Stockings: Circaid Juxta Lite Compression Wrap (DME) Compression Amount: 30-40 mmHg (left) Discharge Instructions: Apply Circaid Juxta Lite Compression Wrap daily as instructed. Apply first thing in the morning, remove at night before bed. [name], [name] ([id number]) [id number] Page 10 of 12 03/14/2023: The dorsal foot wound is somewhat leathery with some dark eschar and slough present. The calcaneal wound is deteriorating further. There are fragments of the bone hanging loose. I used a curette to debride slough and subcutaneous tissue from the dorsal foot wound. I debrided slough, skin, and subcutaneous tissue from the heel. We will continue Vashe wet-to-dry dressing changes to the heel and Medihoney with Hydrofera Blue to the dorsal foot. We will continue to manage his wound until such a time as he undergoes below-knee amputation. Follow-up in 1 week. Electronic Signature(s) Signed: 03/18/2023 7:57:18 AM By: [name] MD FACS Signed: 03/18/2023 1:00:20 PM By: Shawn Stall RN, BSN Previous Signature: 03/14/2023 9:13:07 AM Version By: [name] MD FACS Entered By: Shawn Stall on 03/17/2023 16:28:34 -------------------------------------------------------------------------------- HxROS Details Patient Name: Date of Service: [place], [name] 03/14/2023 8:15 A M Medical Record Number: [id number] Patient Account Number: [id number] Date of Birth/Sex: Treating RN: 13-Aug-1956 (68 y.o. [name] Primary Care Provider: Foye [name] Other  Clinician: Referring Provider: Treating Provider/Extender: Kandis Cocking in Treatment: 26 Eyes Medical History: Positive for: Cataracts Negative for: Glaucoma; Optic Neuritis Ear/Nose/Mouth/Throat Medical History: Negative for: Chronic sinus problems/congestion; Middle ear problems Respiratory Medical History: Positive for: Sleep Apnea Cardiovascular Medical History: Positive for: Hypertension Past Medical History Notes: hyperlipidemia Gastrointestinal Medical History: Negative for: Cirrhosis ; Colitis; Crohns; Hepatitis A; Hepatitis B; Hepatitis C Endocrine Medical History: Positive for: Type II Diabetes Time with diabetes: 20 years Treated with: Insulin Blood sugar tested every day: Yes Tested : 2-3 Genitourinary Medical History: Past Medical History Notes: AKI Immunological Medical History: Negative for: Lupus Erythematosus; Raynauds; Scleroderma [name], [name] ([id number]) [id number] Page 11 of 12 Musculoskeletal Medical History: Positive for: Osteomyelitis - 2023 Neurologic Medical History: Positive for: Neuropathy - Bila lower extremities Oncologic Medical History: Negative for: Received Chemotherapy; Received Radiation Psychiatric Medical History: Negative for: Anorexia/bulimia; Confinement Anxiety HBO Extended History Items Eyes: Cataracts Immunizations Pneumococcal Vaccine: Received Pneumococcal Vaccination: Yes Received Pneumococcal Vaccination On or After 60th Birthday: Yes Implantable Devices Yes Hospitalization / Surgery History Type of Hospitalization/Surgery I and D Left calcaneus back surgery- laminectomy eye surgery- Bila cataracts shoulder arthroscopy Family and Social History Cancer: Yes - Father,Mother; Diabetes: Yes - Mother,Father; Heart Disease: No; Hereditary Spherocytosis: No; Hypertension: No; Kidney Disease: No; Lung Disease: Yes - Father; Seizures: No; Stroke: No;  Thyroid Problems: Yes - Mother; Tuberculosis: No; Never smoker; Marital Status - Married; Alcohol Use: Never; Drug Use: No History; Caffeine Use: Daily; Financial Concerns: No; Food, Clothing or Shelter Needs: No; Support System Lacking: No; Transportation Concerns: No Psychologist, prison and probation services) Signed: 03/14/2023 9:35:45 AM By: [name] MD FACS Signed: 03/14/2023 4:59:51 PM By: Zenaida Deed RN, BSN Entered By: [name] on 03/14/2023 09:11:38 -------------------------------------------------------------------------------- SuperBill Details Patient Name: Date of Service: [place], [name] 03/14/2023 Medical Record Number: [id number] Patient Account Number: [id number] Date of Birth/Sex: Treating RN: Nov 21, 1956 (67 y.o. 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 L97.523 Non-pressure chronic ulcer of other part of left foot with necrosis of 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, left ankle and foot E11.65 Type 2 diabetes mellitus with hyperglycemia E11.621 Type 2 diabetes mellitus with foot ulcer [name], [name] ([id number]) [id number] Page 12 of 12 Facility Procedures : The patient participates with Medicare or their insurance follows the Medicare Facility Guidelines: CPT4 Code Description Modifier Quantity [id number] - DEB SUBQ TISSUE 20 SQ CM/< 1 ICD-10 Diagnosis Description L97.424 Non-pressure chronic ulcer of  left heel and midfoot with necrosis of bone L97.523 Non-pressure chronic ulcer of other part of left foot with necrosis of muscle : The patient participates with Medicare or their insurance follows the Medicare Facility Guidelines: [id number] - DEB SUBQ TISS EA  ADDL 20CM 1 ICD-10 Diagnosis Description L97.424 Non-pressure chronic ulcer of left heel and midfoot with necrosis of  bone L97.523 Non-pressure chronic ulcer of other part of left foot with necrosis of muscle Physician Procedures : CPT4 Code Description Modifier [id number] - WC PHYS LEVEL 3 - EST PT 25 ICD-10 Diagnosis Description 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, 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 Quantity: 1 : [id number] 11042 - WC PHYS SUBQ TISS 20 SQ CM ICD-10 Diagnosis Description 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

## 2023-03-16 NOTE — Patient Outreach (Signed)
  Care Coordination   Initial Visit Note   03/16/2023 Name: Swayde Emanuelson MRN: [id number] DOB: 05/18/1956  Dorie Rank Alsman is a 67 y.o. year old male who sees Nicoletta Dress, MD for primary care. I spoke with  Lewayne Bunting by phone today.  What matters to the patients health and wellness today?  Upcoming left BKA- concerns about wife (wheelchair bound) while he is hospitalized as well as self needs (pre-op/post-op)      Goals Addressed             This Visit's Progress    Planning for care needs       Activities and task to complete in order to accomplish goals.   Call your insurance provider for more information about your Enhanced Benefits  Consider seeking individual counseling-  Start / continue healthy coping activities including relaxed breathing, mindfulness techniques, etc (will email some info to you)  Keep all upcoming appointment discussed today Continue with compliance of taking medication prescribed by Doctor Self Support options  (CSW to connect you with [facility] individual for pre-op support as requested) Make list of questions/concerns to take with you to your appointment with Surgeon Consider private pay home care options if needed/desired to help with wife's care needs while you are hospitalized and rehabilitating Review insurance policy to see if you have Long-term care insurance or a custodial care benefit Complete Advance Directive packet,  Have advance directive notarized and provide a copy to provider office  RNCM to contact you also for assessment and support with medical needs         SDOH assessments and interventions completed:  Yes  SDOH Interventions Today    Flowsheet Row Most Recent Value  SDOH Interventions   Food Insecurity Interventions Intervention Not Indicated  Housing Interventions Intervention Not Indicated  Transportation Interventions Intervention Not Indicated, Patient Resources (Friends/Family)   Utilities Interventions Intervention Not Indicated  Physical Activity Interventions Intervention Not Indicated, Other (Comments)  [limited with pending left BKA]  Stress Interventions Provide Counseling  [Pt voices stressors related to home/medical, etc. CSW offered supportive counseling and suggests individual counseling and Amputee Support Group contact- will assist pt with connecting.]        Care Coordination Interventions:  Yes, provided  Interventions Today    Flowsheet Row Most Recent Value  Chronic Disease   Chronic disease during today's visit Diabetes  General Interventions   General Interventions Discussed/Reviewed General Interventions Discussed, General Interventions Reviewed, Community Resources, Referral to Nurse  Exercise Interventions   Exercise Discussed/Reviewed Exercise Discussed, Physical Activity  [limited due to pending [place] Discussed, Coping Strategies, Other  [Increased stress/worry]  Safety Interventions   Safety Discussed/Reviewed Safety Discussed, Safety Reviewed, Home Safety  [Suggested lige alert type button for home safety (for wife while home alone when pt has surgery)]  Advanced Directive Interventions   Advanced Directives Discussed/Reviewed Advanced Directives Discussed, Advanced Care Planning, Provided resource for acquiring and filling out documents  [Will mail pt packet for completion]       Follow up plan: Follow up call scheduled for 03/21/23    Encounter Outcome:  Pt. Visit Completed

## 2023-03-16 NOTE — Patient Instructions (Signed)
Visit Information  Thank you for taking time to visit with me today. Please don't hesitate to contact me if I can be of assistance to you.   Following are the goals we discussed today:   Goals Addressed             This Visit's Progress    Planning for care needs       Activities and task to complete in order to accomplish goals.   Call your insurance provider for more information about your Enhanced Benefits  Consider seeking individual counseling-  Start / continue healthy coping activities including relaxed breathing, mindfulness techniques, etc (will email some info to you)  Keep all upcoming appointment discussed today Continue with compliance of taking medication prescribed by Doctor Self Support options  (CSW to connect you with [facility] individual for pre-op support as requested) Make list of questions/concerns to take with you to your appointment with Surgeon Consider private pay home care options if needed/desired to help with wife's care needs while you are hospitalized and rehabilitating Review insurance policy to see if you have Long-term care insurance or a custodial care benefit Complete Advance Directive packet,  Have advance directive notarized and provide a copy to provider office  RNCM to contact you also for assessment and support with medical needs         Our next appointment is by telephone on 03/21/23 1PM  Please call the care guide team at [phone] if you need to cancel or reschedule your appointment.   If you are experiencing a Mental Health or [place] or need someone to talk to, please call the Suicide and Crisis Lifeline: 988 call 911   The patient verbalized understanding of instructions, educational materials, and care plan provided today and DECLINED offer to receive copy of patient instructions, educational materials, and care plan.   Telephone follow up appointment with care management team member scheduled  for: 03/21/23 Eduard Clos, MSW, [place] Worker [facility] [phone]

## 2023-03-21 ENCOUNTER — Ambulatory Visit (HOSPITAL_BASED_OUTPATIENT_CLINIC_OR_DEPARTMENT_OTHER): Payer: Medicare Other | Admitting: General Surgery

## 2023-03-21 ENCOUNTER — Ambulatory Visit: Payer: Self-pay | Admitting: *Deleted

## 2023-03-21 NOTE — Progress Notes (Deleted)
[facility] [place]  ASSESSMENT / PLAN: [name] is a 67 y.o. 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 Blood glucose control with goal A1c < 7%. Blood pressure control with goal blood pressure < 140/90 mmHg. Lipid reduction therapy with goal LDL-C <100 mg/dL (<41 if symptomatic from PAD).  Aspirin 81mg  PO QD.  Atorvastatin 40-80mg  PO QD (or other "high intensity" statin therapy). equate.  Plan left lower extremity angiogram with possible intervention via common femoral approach in cath lab 02/11/2023.   CHIEF COMPLAINT: Ulcer about right lower extremity  HISTORY OF PRESENT ILLNESS: [name] is a 67 y.o. male who has been struggling with chronic osteomyelitis of the left heel.I counseled him extensively about this problem and the limb threatening problem he is facing. We had a long discussion about this problem and that even with a successful revascularization, he may need a BKA.  He strongly desires limb salvage.  Past Medical History:  Diagnosis Date   Diabetic foot ulcers (HCC)    DM2 (diabetes mellitus, type 2) (HCC)    HTN (hypertension)    Sleep apnea    No Cpap    Past Surgical History:  Procedure Laterality Date   ABDOMINAL AORTOGRAM W/LOWER EXTREMITY Left 02/18/2023   Procedure: ABDOMINAL AORTOGRAM W/LOWER EXTREMITY;  Surgeon: Leonie Douglas, MD;  Location: [facility] INVASIVE CV LAB;  Service: Cardiovascular;  Laterality: Left;   BACK SURGERY     Laminectomy-Bilateral   EYE SURGERY     Bilateral cataracts and eye implants   I & D EXTREMITY Left 09/21/2019   Procedure: LEFT PARTIAL CALCANEOUS EXCISION;  Surgeon: Nadara Mustard, MD;  Location: [facility] OR;  Service: Orthopedics;  Laterality: Left;   KNEE  ARTHROSCOPY Left    SHOULDER ARTHROSCOPY     Right    No family history on file.  Social History   Socioeconomic History   Marital status: Married    Spouse name: Not on file   Number of children: Not on file   Years of education: Not on file   Highest education level: Not on file  Occupational History   Not on file  Tobacco Use   Smoking status: Never   Smokeless tobacco: Never  Vaping Use   Vaping Use: Never used  Substance and Sexual Activity   Alcohol use: Yes    Comment: rarely   Drug use: Never   Sexual activity: Not on file  Other Topics Concern   Not on file  Social History Narrative   Not on file   Social Determinants of Health   Financial Resource Strain: High Risk (03/08/2023)   Overall Financial Resource Strain (CARDIA)    Difficulty of Paying Living Expenses: Hard  Food Insecurity: No Food Insecurity (03/15/2023)   Hunger Vital Sign    Worried About Running Out of Food in the Last Year: Never true    Ran Out of Food in the Last Year: Never true  Transportation Needs: No Transportation Needs (03/16/2023)   PRAPARE - Administrator, Civil Service (Medical): No    Lack of Transportation (Non-Medical): No  Physical Activity: Inactive (03/16/2023)   Exercise Vital Sign    Days of Exercise per Week: 0 days    Minutes of Exercise per Session: 0  min  Stress: Stress Concern Present (03/16/2023)   [name]    Feeling of Stress : Rather much  Social Connections: Not on file  Intimate Partner Violence: Not At Risk (02/22/2023)   Humiliation, Afraid, Rape, and Kick questionnaire    Fear of Current or Ex-Partner: No    Emotionally Abused: No    Physically Abused: No    Sexually Abused: No    Allergies  Allergen Reactions   Bee Venom Anaphylaxis, Swelling and Rash         Cefepime Rash    Unclear if due to cefepime    Current Outpatient Medications  Medication Sig Dispense Refill    amoxicillin-clavulanate (AUGMENTIN) 875-125 MG tablet Take 1 tablet by mouth 2 (two) times daily. 80 tablet 0   aspirin EC 81 MG tablet Take 81 mg by mouth daily.      atorvastatin (LIPITOR) 10 MG tablet Take 10 mg by mouth at bedtime.     B-D UF III MINI PEN NEEDLES 31G X 5 MM MISC USE AS DIRECTED UP TO 3 TIMES DAILY     Cholecalciferol (VITAMIN D3) 50 MCG (2000 UT) CAPS Take 2,000 Units by mouth daily.     Continuous Blood Gluc Sensor (FREESTYLE LIBRE 14 DAY SENSOR) MISC Use to check blood glucose     cyanocobalamin (VITAMIN B12) 1000 MCG tablet Take 1,000 mcg by mouth daily.     doxycycline (VIBRAMYCIN) 100 MG capsule Take 1 capsule (100 mg total) by mouth 2 (two) times daily. 80 capsule 0   EPINEPHrine (EPIPEN 2-PAK) 0.3 mg/0.3 mL IJ SOAJ injection Inject 0.3 mg into the muscle as needed for anaphylaxis.      ferrous sulfate 325 (65 FE) MG tablet Take 325 mg by mouth daily with breakfast.     furosemide (LASIX) 40 MG tablet Take 40 mg by mouth daily.      losartan (COZAAR) 100 MG tablet Take 100 mg by mouth daily.     NOVOLOG FLEXPEN 100 UNIT/ML FlexPen Inject 5-11 Units into the skin 3 (three) times daily with meals. Sliding scale     oxyCODONE (OXY IR/ROXICODONE) 5 MG immediate release tablet Take 1 tablet (5 mg total) by mouth every 4 (four) hours as needed for moderate pain, severe pain or breakthrough pain. 30 tablet 0   potassium chloride (K-DUR) 10 MEQ tablet Take 10 mEq by mouth daily.  0   propranolol (INDERAL) 20 MG tablet Take 20 mg by mouth 2 (two) times daily.     TRESIBA FLEXTOUCH 200 UNIT/ML SOPN Inject 50-85 Units into the skin See admin instructions. Take 85 units in the morning and 50 units at bedtime     No current facility-administered medications for this visit.    PHYSICAL EXAM There were no vitals filed for this visit.   Chronically ill-appearing man in no distress Regular rate and rhythm Unlabored breathing No pedal pulses L foot bandage with  VAC  PERTINENT LABORATORY AND RADIOLOGIC DATA  Most recent CBC    Latest Ref Rng & Units 02/23/2023    4:09 AM 02/22/2023    8:58 AM 02/21/2023    8:30 PM  CBC  WBC 4.0 - 10.5 K/uL 4.2  4.9  8.3   Hemoglobin 13.0 - 17.0 g/dL 9.7  11.910.2  14.711.5   Hematocrit 39.0 - 52.0 % 29.7  31.4  34.3   Platelets 150 - 400 K/uL 210  198  220  Most recent CMP    Latest Ref Rng & Units 02/23/2023    4:09 AM 02/22/2023    8:58 AM 02/21/2023    8:30 PM  CMP  Glucose 70 - 99 mg/dL 932  671  245   BUN 8 - 23 mg/dL 19  20  27    Creatinine 0.61 - 1.24 mg/dL 8.09  9.83  3.82   Sodium 135 - 145 mmol/L 134  134  132   Potassium 3.5 - 5.1 mmol/L 3.9  3.8  3.7   Chloride 98 - 111 mmol/L 104  103  101   CO2 22 - 32 mmol/L 22  22  25    Calcium 8.9 - 10.3 mg/dL 8.5  8.6  9.0   Total Protein 6.5 - 8.1 g/dL 6.6  6.7  8.1   Total Bilirubin 0.3 - 1.2 mg/dL 0.7  1.0  1.8   Alkaline Phos 38 - 126 U/L 60  64  81   AST 15 - 41 U/L 11  11  13    ALT 0 - 44 U/L 12  11  10     Right: 30-49% stenosis noted in the iliac segment. Diffuse  atherosclerosis.   Left: 30-49% stenosis noted in the iliac segment. 30-49% stenosis noted in  the common femoral artery. 50-74% stenosis noted in the superficial  femoral artery. 50-74% stenosis noted in the popliteal artery. He has  stenosis in the EIA CFA SFA and popliteal   artery.   Rande Brunt. Lenell Antu, MD FACS [facility] Phone Number: [phone] 03/21/2023 10:31 AM   Total time spent on preparing this encounter including chart review, data review, collecting history, examining the patient, coordinating care for this new patient, 60 minutes.  Portions of this report may have been transcribed using voice recognition software.  Every effort has been made to ensure accuracy; however, inadvertent computerized transcription errors may still be present.

## 2023-03-21 NOTE — Patient Instructions (Signed)
Visit Information  Thank you for taking time to visit with me today. Please don't hesitate to contact me if I can be of assistance to you.   Following are the goals we discussed today:   Goals Addressed             This Visit's Progress    Planning for care needs       Activities and task to complete in order to accomplish goals.   Call your insurance provider for more information about your Enhanced Benefits  CSW will seek in-network options for you for individual counseling-  CSW made referral and you can expect email/call from Robin with the Amputee Support Group at [facility] Participate in the [facility] Am[facility]ee Support Group 2nd Thursday of each month- in-person and virtual  CSW requested a mentor to be connected with you to reach out to you for one on one support- Start / continue healthy coping activities including relaxed breathing, mindfulness techniques, etc (will email some info to you)  Keep all upcoming appointment discussed today Continue with compliance of taking medication prescribed by Doctor Make list of questions/concerns to take with you to your appointment with Surgeon as well as for your Support Group and "mentor"  Consider private pay home care options if needed/desired to help with wife's care needs while you are hospitalized and rehabilitating Review insurance policy to see if you have Long-term care insurance or a custodial care benefit Complete Advance Directive packet,  Have advance directive notarized and provide a copy to provider office  RNCM to contact you also for assessment and support with medical needs         Our next appointment is by telephone on 03/23/23    Please call the care guide team at [phone] if you need to cancel or reschedule your appointment.   If you are experiencing a Mental Health or Behavioral Health Crisis or need someone to talk to, please call the Suicide and Crisis Lifeline: 988 call 911   The patient verbalized  understanding of instructions, educational materials, and care plan provided today and DECLINED offer to receive copy of patient instructions, educational materials, and care plan.   Telephone follow up appointment with care management team member scheduled for:03/23/23  Reece Levy, MSW, LCSW Clinical Social Worker [facility] [phone]

## 2023-03-21 NOTE — Patient Outreach (Signed)
  Care Coordination   Follow Up Visit Note   03/21/2023 Name: [name] MRN: [id number] DOB: Jan 12, 1956  [name] is a 67 y.o. year old male who sees [name], MD for primary care. I spoke with  [name] by phone today.  What matters to the patients health and wellness today?  "I want to get connected with counseling"    Goals Addressed             This Visit's Progress    Planning for care needs       Activities and task to complete in order to accomplish goals.   Call your insurance provider for more information about your Enhanced Benefits  CSW will seek in-network options for you for individual counseling-  CSW made referral and you can expect email/call from Robin with the Amputee Support Group at [facility] Participate in the [facility] Amputee Support Group 2nd Thursday of each month- in-person and virtual  CSW requested a mentor to be connected with you to reach out to you for one on one support- Start / continue healthy coping activities including relaxed breathing, mindfulness techniques, etc (will email some info to you)  Keep all upcoming appointment discussed today Continue with compliance of taking medication prescribed by Doctor Make list of questions/concerns to take with you to your appointment with Surgeon as well as for your Support Group and "mentor"  Consider private pay home care options if needed/desired to help with wife's care needs while you are hospitalized and rehabilitating Review insurance policy to see if you have Long-term care insurance or a custodial care benefit Complete Advance Directive packet,  Have advance directive notarized and provide a copy to provider office  RNCM to contact you also for assessment and support with medical needs         SDOH assessments and interventions completed:  Yes  SDOH Interventions Today    Flowsheet Row Most Recent Value  SDOH Interventions   Depression Interventions/Treatment   Counseling          03/21/2023    1:44 PM  Depression screen PHQ 2/9  Decreased Interest 2  Down, Depressed, Hopeless 3  PHQ - 2 Score 5  Altered sleeping 3  Tired, decreased energy 3  Change in appetite 0  Feeling bad or failure about yourself  1  Trouble concentrating 0  Moving slowly or fidgety/restless 0  Suicidal thoughts 0  PHQ-9 Score 12  Difficult doing work/chores Somewhat difficult     Care Coordination Interventions:  Yes, provided  Interventions Today    Flowsheet Row Most Recent Value  Chronic Disease   Chronic disease during today's visit Diabetes  General Interventions   General Interventions Discussed/Reviewed [employer], Communication with  [employer] pt with Support group and mentor for amputees.]  Mental Health Interventions   Mental Health Discussed/Reviewed Coping Strategies, Mental Health Discussed, Mental Health Reviewed, Depression  [Completed depression screening with pt- scoring "12"- agrees to being  connect for individual counseling]  Nutrition Interventions   Nutrition Discussed/Reviewed Nutrition Discussed  [Pt enjoys cooking and wants to do more of this and less fast food,  focusing on healthier choices]       Follow up plan: Follow up call scheduled for 03/23/23    Encounter Outcome:  Pt. Visit Completed

## 2023-03-22 ENCOUNTER — Ambulatory Visit: Payer: Medicare Other | Admitting: Vascular Surgery

## 2023-03-23 ENCOUNTER — Ambulatory Visit: Payer: Self-pay | Admitting: *Deleted

## 2023-03-24 NOTE — Patient Outreach (Signed)
  Care Coordination   Follow Up Visit Note Late Entry  03/24/2023 Name: Makhari Postiglione MRN: [id number] DOB: 1956-10-02  Marrian Salvage Veale is a 67 y.o. year old male who sees [name], MD for primary care. I spoke with  [name] by phone today.  What matters to the patients health and wellness today?  Getting things worked out with insurance issues to clear outstanding balances.    Goals Addressed             This Visit's Progress    Planning for care needs       Activities and task to complete in order to accomplish goals.   Call your insurance provider for more information about your Enhanced Benefits  CSW will seek in-network options for you for individual counseling-  Glad you are planning to attend (in-person/virtual) the Amputee Support Group at [facility] Participate in the [facility] Amputee Support Group 2nd Thursday of each month- in-person and virtual  Expect outreach from [place] at the Amputee Support group to connect you with peer/mentor to be connected with you to reach out to you for one on one support- Start / continue healthy coping activities including relaxed breathing, mindfulness techniques, etc (will email some info to you)  Keep all upcoming appointment discussed today Continue with compliance of taking medication prescribed by Doctor Make list of questions/concerns to take with you to your appointment with Surgeon as well as for your Support Group and "mentor"  Consider private pay home care options if needed/desired to help with wife's care needs while you are hospitalized and rehabilitating Review insurance policy to see if you have Long-term care insurance or a custodial care benefit Complete Advance Directive packet,  Have advance directive notarized and provide a copy to provider office  RNCM to contact you also for assessment and support with medical needs         SDOH assessments and interventions completed:  Yes     Care  Coordination Interventions:  Yes, provided  Interventions Today    Flowsheet Row Most Recent Value  Chronic Disease   Chronic disease during today's visit Diabetes  General Interventions   General Interventions Discussed/Reviewed General Interventions Discussed, Programmer, applications, Communication with  [Amputee Support Group Facilitator, Robin]  Mental Health Interventions   Mental Health Discussed/Reviewed Coping Strategies, Mental Health Discussed, Mental Health Reviewed       Follow up plan: Follow up call scheduled for 04/04/23    Encounter Outcome:  Pt. Visit Completed

## 2023-03-30 ENCOUNTER — Other Ambulatory Visit (HOSPITAL_COMMUNITY): Payer: Self-pay

## 2023-03-31 NOTE — Progress Notes (Signed)
LANORRIS, KALISZ R ([id number]) [id number] Page 1 of 7 Visit Report for 02/28/2023 Arrival Information Details Patient Name: Date of Service: [place], [name] 02/28/2023 9:15 A M Medical Record Number: [id number] Patient Account Number: [id number] Date of Birth/Sex: Treating RN: 07-11-1956 (67 y.o. [name] Primary Care Eliu Batch: Foye [name] Other Clinician: Referring Gesselle Fitzsimons: Treating Namir Neto/Extender: [name] in Treatment: 24 Visit Information History Since Last Visit All ordered tests and consults were completed: Yes Patient Arrived: Knee Scooter Added or deleted any medications: No Arrival Time: 09:31 Any new allergies or adverse reactions: No Accompanied By: self Had a fall or experienced change in No Transfer Assistance: None activities of daily living that may affect Patient Identification Verified: Yes risk of falls: Secondary Verification Process Completed: Yes Signs or symptoms of abuse/neglect since last visito No Patient Requires Transmission-Based Precautions: No Hospitalized since last visit: Yes Patient Has Alerts: No Implantable device outside of the clinic excluding No cellular tissue based products placed in the center since last visit: Pain Present Now: No Electronic Signature(s) Signed: 03/31/2023 1:55:51 PM By: [name] Entered By: [name] on 02/28/2023 09:33:19 -------------------------------------------------------------------------------- Encounter Discharge Information Details Patient Name: Date of Service: [place], [name] 02/28/2023 9:15 A M Medical Record Number: [id number] Patient Account Number: [id number] Date of Birth/Sex: Treating RN: [birth year] (67 y.o. [name] Primary Care Eudelia Hiltunen: Foye [name] Other Clinician: Referring Aldon Hengst: Treating Harith Mccadden/Extender: [name] in Treatment: 24 Encounter Discharge  Information Items Post Procedure Vitals Discharge Condition: Stable Temperature (F): 97.8 Ambulatory Status: Ambulatory Pulse (bpm): 94 Discharge Destination: Home Respiratory Rate (breaths/min): 16 Transportation: Private Auto Blood Pressure (mmHg): 197/84 Accompanied By: self Schedule Follow-up Appointment: Yes Clinical Summary of Care: Patient Declined Electronic Signature(s) Signed: 02/28/2023 4:54:15 PM By: Samuella Bruin Entered By: Samuella Bruin on 02/28/2023 10:34:15 -------------------------------------------------------------------------------- Lower Extremity Assessment Details Patient Name: Date of Service: [place], [name] 02/28/2023 9:15 A M Medical Record Number: [id number] Patient Account Number: [id number] Date of Birth/Sex: Treating RN: October 24, 1956 (67 y.o. [name] Primary Care Verneice Caspers: Foye [name] Other Clinician: Referring Melvin Marmo: Treating Pasha Broad/Extender: Tomi Likens Weeks in Treatment: 24 Edema Assessment Assessed: Kyra Searles: No] [name]: No] S[LeftNYSHAUN, STANDAGE R ([id number])] [Right: [id number] Page 2 of 7] Edema: [Left: Ye] [Right: s] Calf Left: Right: Point of Measurement: From Medial Instep 47 cm Ankle Left: Right: Point of Measurement: From Medial Instep 25 cm Electronic Signature(s) Signed: 03/31/2023 1:55:51 PM By: [name] Entered By: [name] on 02/28/2023 09:35:26 -------------------------------------------------------------------------------- Multi Wound Chart Details Patient Name: Date of Service: [place], [name] 02/28/2023 9:15 A M Medical Record Number: [id number] Patient Account Number: [id number] Date of Birth/Sex: Treating RN: 04/12/1956 (67 y.o. M) Primary Care Javonn Gauger: Foye [name] Other Clinician: Referring Allan Minotti: Treating Randall Rampersad/Extender: [name] in Treatment: 24 Vital Signs Height(in): 74 Capillary  Blood Glucose(mg/dl): 235 Weight(lbs): 573 Pulse(bpm): 94 Body Mass Index(BMI): 31.5 Blood Pressure(mmHg): 197/84 Temperature(F): 97.8 Respiratory Rate(breaths/min): 16 [1:Photos:] [N/A:N/A] Left Calcaneus Left, Dorsal Foot N/A Wound Location: Pressure Injury Pressure Injury N/A Wounding Event: Diabetic Wound/Ulcer of the Lower Diabetic Wound/Ulcer of the Lower N/A Primary Etiology: Extremity Extremity Cataracts, Sleep Apnea, Hypertension, Cataracts, Sleep Apnea, Hypertension, N/A Comorbid History: Type II Diabetes, Osteomyelitis, Type II Diabetes, Osteomyelitis, Neuropathy Neuropathy 03/09/2019 07/09/2022 N/A Date Acquired: 24 24 N/A Weeks of Treatment: Open Open N/A Wound Status: No No N/A Wound Recurrence: 6x5x0.4 3.2x2.8x0.1 N/A Measurements L x W x D (cm) 23.562  7.037 N/A A (cm) : rea 9.425 0.704 N/A Volume (cm) : -9.10% 25.30% N/A % Reduction in A rea: 56.40% 25.30% N/A % Reduction in Volume: 4 Starting Position 1 (o'clock): 7 Ending Position 1 (o'clock): 2 Maximum Distance 1 (cm): Yes No N/A Undermining: Grade 3 Grade 2 N/A Classification: Large Medium N/A Exudate A mount: Serosanguineous Serosanguineous N/A Exudate Type: red, brown red, brown N/A Exudate Color: Thickened Flat and Intact N/A Wound Margin: Small (1-33%) Large (67-100%) N/A Granulation A mount: Pink, Pale Red, Pink N/A Granulation Quality: Large (67-100%) Small (1-33%) N/A Necrotic A mount: Fat Layer (Subcutaneous Tissue): Yes Fat Layer (Subcutaneous Tissue): Yes N/A Exposed Structures: Bone: Yes Fascia: No Fascia: No Tendon: No Tendon: No Muscle: No CLEMONS, SALVUCCI ([id number]) [id number] Page 3 of 7 Muscle: No Joint: No Joint: No Bone: No Small (1-33%) Medium (34-66%) N/A Epithelialization: Debridement - Selective/Open Wound N/A N/A Debridement: Pre-procedure Verification/Time Out 09:58 N/A N/A Taken: Callus, Slough N/A N/A Tissue  Debrided: Non-Viable Tissue N/A N/A Level: 30 N/A N/A Debridement A (sq cm): rea Curette N/A N/A Instrument: Minimum N/A N/A Bleeding: Pressure N/A N/A Hemostasis A chieved: Procedure was tolerated well N/A N/A Debridement Treatment Response: 6x5x0.4 N/A N/A Post Debridement Measurements L x W x D (cm) 9.425 N/A N/A Post Debridement Volume: (cm) Callus: Yes Scarring: Yes N/A Periwound Skin Texture: Maceration: Yes Maceration: Yes N/A Periwound Skin Moisture: Dry/Scaly: No Dry/Scaly: No No Abnormalities Noted No Abnormalities Noted N/A Periwound Skin Color: No Abnormality No Abnormality N/A Temperature: Debridement N/A N/A Procedures Performed: Treatment Notes Electronic Signature(s) Signed: 02/28/2023 12:10:44 PM By: [name] DO Entered By: [name] on 02/28/2023 10:12:43 -------------------------------------------------------------------------------- Multi-Disciplinary Care Plan Details Patient Name: Date of Service: [place], [name] 02/28/2023 9:15 A M Medical Record Number: [id number] Patient Account Number: [id number] Date of Birth/Sex: Treating RN: [birth year] (67 y.o. 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 and treatment procedures Potential for barotraumas to ears, sinuses, teeth, and lungs or cerebral gas embolism related to changes in atmospheric pressure inside hyperbaric oxygen chamber Potential for oxygen toxicity seizures related to delivery of 100% oxygen at an increased atmospheric pressure Potential for pulmonary oxygen toxicity related to delivery of 100% oxygen at an increased atmospheric pressure Goals: Barotrauma will be  prevented during HBO2 Date Initiated: 10/06/2022 Target Resolution Date: 03/14/2023 Goal Status: Active Patient and/or family will be able to state/discuss factors appropriate to the management of their disease process during treatment Date Initiated: 10/06/2022 Target Resolution Date: 03/14/2023 Goal Status: Active Patient will tolerate the hyperbaric oxygen therapy treatment Date Initiated: 10/06/2022 Target Resolution Date: 03/14/2023 Goal Status: Active Patient/caregiver will verbalize understanding of HBO goals, rationale, procedures and potential hazards Date Initiated: 10/06/2022 Target Resolution Date: 03/14/2023 Goal Status: Active Interventions: Administer decongestants, per physician orders, prior to HBO2 Administer the correct therapeutic gas delivery based on the patients needs and limitations, per physician order Assess and provide for patients comfort related to the hyperbaric environment and equalization of middle ear Assess for signs and symptoms related to adverse events, including but not limited to confinement anxiety, pneumothorax, oxygen toxicity and baurotrauma NotesYADEN, SEITH ([id number]) [phone].pdf Page 4 of 7 Wound/Skin Impairment Nursing Diagnoses: Impaired tissue integrity Goals: Patient/caregiver will verbalize understanding of skin care regimen Date Initiated: 10/06/2022 Target Resolution Date:  03/10/2023 Goal Status: Active Ulcer/skin breakdown will have a volume reduction of 30% by week 4 Date Initiated: 09/10/2022 Date Inactivated: 10/06/2022 Target Resolution Date: 10/08/2022 Goal Status: Unmet Unmet Reason: osteo, HBOT Ulcer/skin breakdown will have a volume reduction of 50% by week 8 Date Initiated: 10/06/2022 Date Inactivated: 02/14/2023 Target Resolution Date: 01/14/2023 Unmet Reason: insufficient perfusion Goal Status: Unmet to Left lower extremity Ulcer/skin breakdown will have a volume reduction of 80% by week  12 Date Initiated: 02/14/2023 Target Resolution Date: 03/15/2023 Goal Status: Active Interventions: Assess ulceration(s) every visit Provide education on ulcer and skin care Treatment Activities: Consult for HBO : 09/10/2022 Skin care regimen initiated : 09/10/2022 Notes: NPWT started 12/27/22 Electronic Signature(s) Signed: 02/28/2023 4:54:15 PM By: Samuella Bruin Entered By: Samuella Bruin on 02/28/2023 09:43:44 -------------------------------------------------------------------------------- Pain Assessment Details Patient Name: Date of Service: [place], [name] 02/28/2023 9:15 A M Medical Record Number: [id number] Patient Account Number: [id number] Date of Birth/Sex: Treating RN: [birth year] (67 y.o. [name] Primary Care Nekesha Font: Foye [name] Other Clinician: Referring Romolo Sieling: Treating Bebe Moncure/Extender: [name] in Treatment: 24 Active Problems Location of Pain Severity and Description of Pain Patient Has Paino No Site Locations Pain Management and Medication Current Pain Management: CLEARANCE, CHENAULT ([id number]) [phone].pdf Page 5 of 7 Electronic Signature(s) Signed: 03/31/2023 1:55:51 PM By: [name] Entered By: [name] on 02/28/2023 09:34:43 -------------------------------------------------------------------------------- Patient/Caregiver Education Details Patient Name: Date of Service: [name] NA LD R. 3/18/2024andnbsp9:15 A M Medical Record Number: [id number] Patient Account Number: [id number] Date of Birth/Gender: Treating RN: [birth year] (67 y.o. [name] Primary Care Physician: Foye [name] Other Clinician: Referring Physician: Treating Physician/Extender: [name] in Treatment: 24 Education Assessment Education Provided To: Patient Education Topics Provided Wound/Skin Impairment: Methods: Explain/Verbal Responses:  Reinforcements needed, State content correctly Electronic Signature(s) Signed: 02/28/2023 4:54:15 PM By: Samuella Bruin Entered By: Samuella Bruin on 02/28/2023 09:43:54 -------------------------------------------------------------------------------- Wound Assessment Details Patient Name: Date of Service: [place], [name] 02/28/2023 9:15 A M Medical Record Number: [id number] Patient Account Number: [id number] Date of Birth/Sex: Treating RN: 1956/10/10 (67 y.o. [name] Primary Care Ison Wichmann: Foye [name] Other Clinician: Referring Kreed Kauffman: Treating Ratasha Fabre/Extender: Tomi Likens Weeks in Treatment: 24 Wound Status Wound Number: 1 Primary Diabetic Wound/Ulcer of the Lower Extremity Etiology: Wound Location: Left Calcaneus Wound Open Wounding Event: Pressure Injury Status: Date Acquired: 03/09/2019 Comorbid Cataracts, Sleep Apnea, Hypertension, Type II Diabetes, Weeks Of Treatment: 24 History: Osteomyelitis, Neuropathy Clustered Wound: No Photos Wound Measurements Length: (cm) 6 Width: (cm) 5 Depth: (cm) 0.4 Area: (cm) 23.562 Volume: (cm) 9.425 FAUST, THORINGTON R ([id number]) % Reduction in Area: -9.1% % Reduction in Volume: 56.4% Epithelialization: Small (1-33%) Undermining: Yes Starting Position (o'clock): 4 Ending Position (o'clock): 7 [id number] Page 6 of 7 Maximum Distance: (cm) 2 Wound Description Classification: Grade 3 Wound Margin: Thickened Exudate Amount: Large Exudate Type: Serosanguineous Exudate Color: red, brown Foul Odor After Cleansing: No Slough/Fibrino Yes Wound Bed Granulation Amount: Small (1-33%) Exposed Structure Granulation Quality: Pink, Pale Fascia Exposed: No Necrotic Amount: Large (67-100%) Fat Layer (Subcutaneous Tissue) Exposed: Yes Necrotic Quality: Adherent Slough Tendon Exposed: No Muscle Exposed: No Joint Exposed: No Bone Exposed: Yes Periwound Skin  Texture Texture Color No Abnormalities Noted: No No Abnormalities Noted: Yes Callus: Yes Temperature / Pain Temperature: No Abnormality Moisture No Abnormalities Noted: No Dry / Scaly: No Maceration: Yes Electronic Signature(s) Signed: 02/28/2023 4:54:15 PM By: Samuella Bruin Signed: 02/28/2023 5:06:20 PM By: Tommie Ard RN  Entered By: Tommie Ard on 02/28/2023 09:43:56 -------------------------------------------------------------------------------- Wound Assessment Details Patient Name: Date of Service: [place], [name] 02/28/2023 9:15 A M Medical Record Number: [id number] Patient Account Number: [id number] Date of Birth/Sex: Treating RN: Feb 01, 1956 (67 y.o. [name] Primary Care Marka Treloar: Foye [name] Other Clinician: Referring Dekker Verga: Treating Tysen Roesler/Extender: Tomi Likens Weeks in Treatment: 24 Wound Status Wound Number: 2 Primary Diabetic Wound/Ulcer of the Lower Extremity Etiology: Wound Location: Left, Dorsal Foot Wound Open Wounding Event: Pressure Injury Status: Date Acquired: 07/09/2022 Comorbid Cataracts, Sleep Apnea, Hypertension, Type II Diabetes, Weeks Of Treatment: 24 History: Osteomyelitis, Neuropathy Clustered Wound: No Photos Wound Measurements Length: (cm) 3 Width: (cm) 2 Depth: (cm) 0 Area: (cm) Kyllonen, Ediberto R ([id number]) Volume: (cm) .2 % Reduction in Area: 25.3% .8 % Reduction in Volume: 25.3% .1 Epithelialization: Medium (34-66%) 7.037 Tunneling: No [id number] Page 7 of 7 0.704 Undermining: No Wound Description Classification: Grade 2 Wound Margin: Flat and Intact Exudate Amount: Medium Exudate Type: Serosanguineous Exudate Color: red, brown Foul Odor After Cleansing: No Slough/Fibrino Yes Wound Bed Granulation Amount: Large (67-100%) Exposed Structure Granulation Quality: Red, Pink Fascia Exposed: No Necrotic Amount: Small (1-33%) Fat Layer (Subcutaneous  Tissue) Exposed: Yes Necrotic Quality: Adherent Slough Tendon Exposed: No Muscle Exposed: No Joint Exposed: No Bone Exposed: No Periwound Skin Texture Texture Color No Abnormalities Noted: No No Abnormalities Noted: Yes Scarring: Yes Temperature / Pain Temperature: No Abnormality Moisture No Abnormalities Noted: Yes Electronic Signature(s) Signed: 02/28/2023 4:54:15 PM By: Samuella Bruin Signed: 02/28/2023 5:06:20 PM By: Tommie Ard RN Entered By: Tommie Ard on 02/28/2023 09:44:31 -------------------------------------------------------------------------------- Vitals Details Patient Name: Date of Service: [name], [name] 02/28/2023 9:15 A M Medical Record Number: [id number] Patient Account Number: [id number] Date of Birth/Sex: Treating RN: [birth year] (67 y.o. [name] Primary Care Teila Skalsky: Foye [name] Other Clinician: Referring Jayce Kainz: Treating Orville Mena/Extender: [name] in Treatment: 24 Vital Signs Time Taken: 09:33 Temperature (F): 97.8 Height (in): 74 Pulse (bpm): 94 Weight (lbs): 245 Respiratory Rate (breaths/min): 16 Body Mass Index (BMI): 31.5 Blood Pressure (mmHg): 197/84 Capillary Blood Glucose (mg/dl): 606 Reference Range: 80 - 120 mg / dl Electronic Signature(s) Signed: 03/31/2023 1:55:51 PM By: [name] Entered By: [name] on 02/28/2023 09:34:25

## 2023-04-04 ENCOUNTER — Ambulatory Visit: Payer: Self-pay | Admitting: *Deleted

## 2023-04-04 ENCOUNTER — Encounter (HOSPITAL_BASED_OUTPATIENT_CLINIC_OR_DEPARTMENT_OTHER): Payer: Medicare Other | Admitting: General Surgery

## 2023-04-04 DIAGNOSIS — E11621 Type 2 diabetes mellitus with foot ulcer: Secondary | ICD-10-CM | POA: Diagnosis not present

## 2023-04-04 NOTE — Patient Outreach (Signed)
  Care Coordination   Follow Up Visit Note   04/05/2023 Name: [name] MRN: [id number] DOB: [birth year]  [name] is a 67 y.o. year old male who sees [name], MD for primary care. I spoke with  [name] by phone today.  What matters to the patients health and wellness today?  Went to wound center today and had some improvement of the wound.     Goals Addressed             This Visit's Progress    Planning for care needs       Activities and task to complete in order to accomplish goals.   Call your insurance provider for more information about your Enhanced Benefits  CSW will seek in-network options for you for individual counseling-  Expecting an email to link you with the (in-person/virtual) Amputee Support Group at [facility] / continue healthy coping activities including relaxed breathing, mindfulness techniques, etc (will email some info to you)  Keep all upcoming appointment discussed today Continue with compliance of taking medication prescribed by Doctor Make list of questions/concerns to take with you to your appointment with Surgeon as well as for your Support Group and "mentor"  Await call from [facility]- for hopeful help with providing assistance with light housekeeping, laundry, errands and some personal care  Consider private pay home care options if needed/desired to help with wife's care needs while you are hospitalized and rehabilitating Review insurance policy to see if you have Long-term care insurance or a custodial care benefit Complete Advance Directive packet,  Have advance directive notarized and provide a copy to provider office  RNCM to contact you also for assessment and support with medical needs         SDOH assessments and interventions completed:  Yes     Care Coordination Interventions:  Yes, provided  Interventions Today    Flowsheet Row Most Recent Value  Chronic Disease    Chronic disease during today's visit Diabetes  General Interventions   General Interventions Discussed/Reviewed [facility], Doctor Visits  [pt reports he went to the Wound Center today and was slight improvement with his foot/heel wound.]  Doctor Visits Discussed/Reviewed Doctor Visits Discussed  Mental Health Interventions   Mental Health Discussed/Reviewed Mental Health Discussed, Mental Health Reviewed, Coping Strategies, Depression, Other  [Pt with multiple stressors impacting his overall mental health and quality of life]       Follow up plan: Follow up call scheduled for 04/11/23    Encounter Outcome:  Pt. Visit Completed

## 2023-04-05 NOTE — Patient Instructions (Signed)
Visit Information  Thank you for taking time to visit with me today. Please don't hesitate to contact me if I can be of assistance to you.   Following are the goals we discussed today:   Goals Addressed             This Visit's Progress    Planning for care needs       Activities and task to complete in order to accomplish goals.   Call your insurance provider for more information about your Enhanced Benefits  CSW will seek in-network options for you for individual counseling-  Expecting an email to link you with the (in-person/virtual) Amputee Support Group at [facility] / continue healthy coping activities including relaxed breathing, mindfulness techniques, etc (will email some info to you)  Keep all upcoming appointment discussed today Continue with compliance of taking medication prescribed by Doctor Make list of questions/concerns to take with you to your appointment with Surgeon as well as for your Support Group and "mentor"  Await call from [facility]- for hopeful help with providing assistance with light housekeeping, laundry, errands and some personal care  Consider private pay home care options if needed/desired to help with wife's care needs while you are hospitalized and rehabilitating Review insurance policy to see if you have Long-term care insurance or a custodial care benefit Complete Advance Directive packet,  Have advance directive notarized and provide a copy to provider office  RNCM to contact you also for assessment and support with medical needs         Our next appointment is by telephone on 04/11/23    Please call the care guide team at [phone] if you need to cancel or reschedule your appointment.   If you are experiencing a Mental Health or Behavioral Health Crisis or need someone to talk to, please call the Suicide and Crisis Lifeline: 988 call 911   The patient verbalized understanding of instructions, educational  materials, and care plan provided today and DECLINED offer to receive copy of patient instructions, educational materials, and care plan.   Telephone follow up appointment with care management team member scheduled for:04/11/23  Reece Levy, MSW, LCSW Clinical Social Worker [facility] [employer] [phone]

## 2023-04-06 NOTE — Progress Notes (Signed)
ASHWIN, TIBBS ([id number]) [id number] Page 1 of 9 Visit Report for 04/04/2023 Arrival Information Details Patient Name: Date of Service: [place], [name] 04/04/2023 10:15 A M Medical Record Number: [id number] Patient Account Number: [id number] Date of Birth/Sex: Treating RN: 1956-09-20 (67 y.o. [name] Primary Care Feleshia Zundel: Foye [name] Other Clinician: Referring Rachid Parham: Treating Darlinda Bellows/Extender: Kandis Cocking in Treatment: 29 Visit Information History Since Last Visit All ordered tests and consults were completed: Yes Patient Arrived: Knee Scooter Added or deleted any medications: No Arrival Time: 10:08 Any new allergies or adverse reactions: No Accompanied By: self Had a fall or experienced change in No Transfer Assistance: None activities of daily living that may affect Patient Identification Verified: Yes risk of falls: Secondary Verification Process Completed: Yes Signs or symptoms of abuse/neglect since last visito No Patient Requires Transmission-Based Precautions: No Hospitalized since last visit: No Patient Has Alerts: No Implantable device outside of the clinic excluding No cellular tissue based products placed in the center since last visit: Has Dressing in Place as Prescribed: Yes Pain Present Now: No Electronic Signature(s) Signed: 04/06/2023 10:17:35 AM By: [name] Entered By: [name] on 04/04/2023 10:10:02 -------------------------------------------------------------------------------- Encounter Discharge Information Details Patient Name: Date of Service: [place], [name] 04/04/2023 10:15 A M Medical Record Number: [id number] Patient Account Number: [id number] Date of Birth/Sex: Treating RN: [birth year] (67 y.o. [name] Primary Care Rodrigus Kilker: Foye [name] Other Clinician: Referring Myrtice Lowdermilk: Treating Ausha Sieh/Extender: Kandis Cocking  in Treatment: 29 Encounter Discharge Information Items Post Procedure Vitals Discharge Condition: Stable Temperature (F): 97.9 Ambulatory Status: Knee Scooter Pulse (bpm): 90 Discharge Destination: Home Respiratory Rate (breaths/min): 18 Transportation: Private Auto Blood Pressure (mmHg): 151/82 Accompanied By: self Schedule Follow-up Appointment: Yes Clinical Summary of Care: Patient Declined Electronic Signature(s) Signed: 04/06/2023 10:17:35 AM By: [name] Entered By: [name] on 04/04/2023 11:25:31 -------------------------------------------------------------------------------- Lower Extremity Assessment Details Patient Name: Date of Service: [place], [name] 04/04/2023 10:15 A M Medical Record Number: [id number] Patient Account Number: [id number] Date of Birth/Sex: Treating RN: [birth year] (67 y.o. [name] Primary Care Leonard Hendler: Foye [name] Other Clinician: Referring Aysen Shieh: Treating Jameek Bruntz/Extender: Kayleen Memos Weeks in Treatment: [address] Edema Assessment S[Left: [name], [name] ([id number])] [name]: [id number] Page 2 of 9] Assessed: [Left: No] [Right: No] Edema: [Left: Ye] [Right: s] Calf Left: Right: Point of Measurement: From Medial Instep 43.5 cm Ankle Left: Right: Point of Measurement: From Medial Instep 26 cm Vascular Assessment Pulses: Dorsalis Pedis Palpable: [Left:Yes] Popliteal Doppler Audible: [Left:Yes] Electronic Signature(s) Signed: 04/06/2023 10:17:35 AM By: [name] Entered By: [name] on 04/04/2023 10:33:10 -------------------------------------------------------------------------------- Multi Wound Chart Details Patient Name: Date of Service: [place], [name] 04/04/2023 10:15 A M Medical Record Number: [id number] Patient Account Number: [id number] Date of Birth/Sex: Treating RN: 1956/06/04 (67 y.o. M) Primary Care Lorn Butcher: Foye [name] Other  Clinician: Referring Jashad Depaula: Treating Aparna Vanderweele/Extender: Kandis Cocking in Treatment: 29 Vital Signs Height(in): 74 Capillary Blood Glucose(mg/dl): 91 Weight(lbs): 416 Pulse(bpm): 90 Body Mass Index(BMI): 31.5 Blood Pressure(mmHg): 151/82 Temperature(F): 97.9 Respiratory Rate(breaths/min): 18 [1:Photos:] [N/A:N/A] Left Calcaneus Left, Dorsal Foot N/A Wound Location: Pressure Injury Pressure Injury N/A Wounding Event: Diabetic Wound/Ulcer of the Lower Diabetic Wound/Ulcer of the Lower N/A Primary Etiology: Extremity Extremity Cataracts, Sleep Apnea, Hypertension, Cataracts, Sleep Apnea, Hypertension, N/A Comorbid History: Type II Diabetes, Osteomyelitis, Type II Diabetes, Osteomyelitis, Neuropathy Neuropathy 03/09/2019 07/09/2022 N/A Date Acquired: 29 29 N/A Weeks of Treatment: Open Open  N/A Wound Status: No No N/A Wound Recurrence: 6.5x5.1x0.5 3.2x3.4x0.1 N/A Measurements L x W x D (cm) 26.036 8.545 N/A A (cm) : rea 13.018 0.855 N/A Volume (cm) : -20.50% 9.30% N/A % Reduction in A rea: 39.70% 9.20% N/A % Reduction in Volume: Grade 3 Grade 2 N/A Classification: Large Medium N/A Exudate A mount: Serosanguineous Serosanguineous N/A Exudate Type: red, brown red, brown N/A Exudate Color: Thickened Flat and Intact N/A Wound Margin: Large (67-100%) Small (1-33%) N/A Granulation A mount: [name], [name] ([id number]) [id number] Page 3 of 9 Pink, Pale Red, Pink N/A Granulation Quality: Small (1-33%) Large (67-100%) N/A Necrotic Amount: Adherent Slough Eschar, Adherent Slough N/A Necrotic Tissue: Fat Layer (Subcutaneous Tissue): Yes Fat Layer (Subcutaneous Tissue): Yes N/A Exposed Structures: Bone: Yes Fascia: No Fascia: No Tendon: No Tendon: No Muscle: No Muscle: No Joint: No Joint: No Bone: No None Small (1-33%) N/A Epithelialization: Debridement - Selective/Open Wound Debridement -  Selective/Open Wound N/A Debridement: Pre-procedure Verification/Time Out 10:40 10:40 N/A Taken: Lidocaine 4% T opical Solution Lidocaine 4% Topical Solution N/A Pain Control: Callus, [facility] N/A Tissue Debrided: Skin/Epidermis Non-Viable Tissue N/A Level: 26.02 8.54 N/A Debridement A (sq cm): rea Curette Curette N/A Instrument: Minimum Minimum N/A Bleeding: Pressure Pressure N/A Hemostasis A chieved: 0 0 N/A Procedural Pain: 0 0 N/A Post Procedural Pain: Procedure was tolerated well Procedure was tolerated well N/A Debridement Treatment Response: 6.5x5.1x0.5 3.2x3.4x0.1 N/A Post Debridement Measurements L x W x D (cm) 13.018 0.855 N/A Post Debridement Volume: (cm) Callus: Yes Scarring: Yes N/A Periwound Skin Texture: Maceration: Yes Dry/Scaly: Yes N/A Periwound Skin Moisture: Dry/Scaly: No Maceration: No Erythema: Yes Rubor: No N/A Periwound Skin Color: Rubor: No No Abnormality No Abnormality N/A Temperature: Debridement Debridement N/A Procedures Performed: Treatment Notes Wound #1 (Calcaneus) Wound Laterality: Left Cleanser Soap and Water Discharge Instruction: May shower and wash wound with dial antibacterial soap and water prior to dressing change. Vashe 5.8 (oz) Discharge Instruction: Cleanse the wound with Vashe prior to applying a clean dressing using gauze sponges, not tissue or cotton balls. Peri-Wound Care Zinc Oxide Ointment 30g tube Discharge Instruction: Apply Zinc Oxide to periwound with each dressing change Topical keystone Primary Dressing Secondary Dressing Woven Gauze Sponge, Non-Sterile 4x4 in Discharge Instruction: Wet with Vashe we to dry. Zetuvit Plus 4x8 in Discharge Instruction: Apply over primary dressing as directed. Secured With [employer] 4x5 (in/yd) Discharge Instruction: Secure with Coban as directed. Kerlix Roll Sterile, 4.5x3.1 (in/yd) Discharge Instruction: Secure with Kerlix as  directed. Compression Wrap Compression Stockings Add-Ons Wound #2 (Foot) Wound Laterality: Dorsal, Left Cleanser Soap and Water Discharge Instruction: May shower and wash wound with dial antibacterial soap and water prior to dressing change. Vashe 5.8 (oz) Discharge Instruction: Cleanse the wound with Vashe prior to applying a clean dressing using gauze sponges, not tissue or cotton balls. [name], [name] ([id number]) [id number] Page 4 of 9 Peri-Wound Care keystone Topical Primary Dressing Hydrofera Blue Ready Transfer Foam, 2.5x2.5 (in/in) Discharge Instruction: Apply directly to wound bed as directed MediHoney Gel, tube 1.5 (oz) Discharge Instruction: Apply to wound bed as instructed Secondary Dressing Zetuvit Plus 4x8 in Discharge Instruction: Apply over primary dressing as directed. Secured With [employer] 4x5 (in/yd) Discharge Instruction: Secure with Coban as directed. Kerlix Roll Sterile, 4.5x3.1 (in/yd) Discharge Instruction: Secure with Kerlix as directed. Compression Wrap Compression Stockings Circaid Juxta Lite Compression Wrap Quantity: 1 Left Leg Compression Amount: 30-40 mmHg Discharge Instruction: Apply Circaid Juxta Lite Compression Wrap daily as instructed. Apply  first thing in the morning, remove at night before bed. Add-Ons Electronic Signature(s) Signed: 04/04/2023 11:16:46 AM By: [name] MD FACS Entered By: [name] on 04/04/2023 11:16:45 -------------------------------------------------------------------------------- Multi-Disciplinary Care Plan Details Patient Name: Date of Service: [place], [name] 04/04/2023 10:15 A M Medical Record Number: [id number] Patient Account Number: [id number] Date of Birth/Sex: Treating RN: 1956-06-20 (67 y.o. [name] Primary Care Averi Kilty: Foye [name] Other Clinician: Referring Safiya Girdler: Treating Aleecia Tapia/Extender: Kandis Cocking in Treatment: 29 Multidisciplinary Care Plan reviewed with physician Active Inactive Wound/Skin Impairment Nursing Diagnoses: Impaired tissue integrity Goals: Patient/caregiver will verbalize understanding of skin care regimen Date Initiated: 10/06/2022 Target Resolution Date: 04/11/2023 Goal Status: Active Ulcer/skin breakdown will have a volume reduction of 30% by week 4 Date Initiated: 09/10/2022 Date Inactivated: 10/06/2022 Target Resolution Date: 10/08/2022 Goal Status: Unmet Unmet Reason: osteo, HBOT Ulcer/skin breakdown will have a volume reduction of 50% by week 8 Date Initiated: 10/06/2022 Date Inactivated: 02/14/2023 Target Resolution Date: 01/14/2023 Unmet Reason: insufficient perfusion Goal Status: Unmet to Left lower extremity Ulcer/skin breakdown will have a volume reduction of 80% by week 12 Date Initiated: 02/14/2023 Date Inactivated: 03/14/2023 Target Resolution Date: 03/15/2023 Goal Status: Unmet Unmet Reason: osteomyelitis Interventions: [name], [name] ([id number]) [id number] Page 5 of 9 Assess ulceration(s) every visit Provide education on ulcer and skin care Treatment Activities: Consult for HBO : 09/10/2022 Skin care regimen initiated : 09/10/2022 Notes: NPWT started 12/27/22 Electronic Signature(s) Signed: 04/06/2023 10:17:35 AM By: [name] Entered By: [name] on 04/04/2023 10:34:42 -------------------------------------------------------------------------------- Pain Assessment Details Patient Name: Date of Service: [place], [name] 04/04/2023 10:15 A M Medical Record Number: [id number] Patient Account Number: [id number] Date of Birth/Sex: Treating RN: 1956-08-28 (67 y.o. [name] Primary Care Stefany Starace: Foye [name] Other Clinician: Referring Bob Daversa: Treating Levia Waltermire/Extender: Kandis Cocking in Treatment: 29 Active Problems Location of Pain Severity and  Description of Pain Patient Has Paino No Site Locations Pain Management and Medication Current Pain Management: Electronic Signature(s) Signed: 04/06/2023 10:17:35 AM By: [name] Entered By: [name] on 04/04/2023 10:13:02 -------------------------------------------------------------------------------- Patient/Caregiver Education Details Patient Name: Date of Service: [name] 4/22/2024andnbsp10:15 A M Medical Record Number: [id number] Patient Account Number: [id number] Date of Birth/Gender: Treating RN: [birth year] (67 y.o. [name] Primary Care Physician: Foye [name] Other Clinician: Referring Physician: Treating Physician/Extender: Kandis Cocking in Treatment: 29 Education Assessment Education Provided To: ORY, ELTING ([id number]) [id number] Page 6 of 9 Patient Education Topics Provided Wound/Skin Impairment: Methods: Explain/Verbal Responses: State content correctly Electronic Signature(s) Signed: 04/06/2023 10:17:35 AM By: [name] Entered By: [name] on 04/04/2023 10:35:12 -------------------------------------------------------------------------------- Wound Assessment Details Patient Name: Date of Service: [place], [name] 04/04/2023 10:15 A M Medical Record Number: [id number] Patient Account Number: [id number] Date of Birth/Sex: Treating RN: 07/22/1956 (67 y.o. [name] Primary Care Melat Wrisley: Foye [name] Other Clinician: Referring Brenleigh Collet: Treating Olen Eaves/Extender: Kandis Cocking in Treatment: 29 Wound Status Wound Number: 1 Primary Diabetic Wound/Ulcer of the Lower Extremity Etiology: Wound Location: Left Calcaneus Wound Open Wounding Event: Pressure Injury Status: Date Acquired: 03/09/2019 Comorbid Cataracts, Sleep Apnea, Hypertension, Type II Diabetes, Weeks Of Treatment: 29 History: Osteomyelitis,  Neuropathy Clustered Wound: No Photos Wound Measurements Length: (cm) 6.5 Width: (cm) 5.1 Depth: (cm) 0.5 Area: (cm) 26.036 Volume: (cm) 13.018 % Reduction in Area: -20.5% % Reduction in Volume: 39.7% Epithelialization: None Tunneling: No Undermining: No Wound Description Classification: Grade 3 Wound  Margin: Thickened Exudate Amount: Large Exudate Type: Serosanguineous Exudate Color: red, brown Foul Odor After Cleansing: No Slough/Fibrino Yes Wound Bed Granulation Amount: Large (67-100%) Exposed Structure Granulation Quality: Pink, Pale Fascia Exposed: No Necrotic Amount: Small (1-33%) Fat Layer (Subcutaneous Tissue) Exposed: Yes Necrotic Quality: Adherent Slough Tendon Exposed: No Muscle Exposed: No Joint Exposed: No Bone Exposed: Yes Periwound Skin Texture Texture Color No Abnormalities Noted: No No Abnormalities Noted: No [name], [name] ([id number]) [id number] Page 7 of 9 Callus: Yes Erythema: Yes Rubor: No Moisture No Abnormalities Noted: No Temperature / Pain Dry / Scaly: No Temperature: No Abnormality Maceration: Yes Treatment Notes Wound #1 (Calcaneus) Wound Laterality: Left Cleanser Soap and Water Discharge Instruction: May shower and wash wound with dial antibacterial soap and water prior to dressing change. Vashe 5.8 (oz) Discharge Instruction: Cleanse the wound with Vashe prior to applying a clean dressing using gauze sponges, not tissue or cotton balls. Peri-Wound Care Zinc Oxide Ointment 30g tube Discharge Instruction: Apply Zinc Oxide to periwound with each dressing change Topical keystone Primary Dressing Secondary Dressing Woven Gauze Sponge, Non-Sterile 4x4 in Discharge Instruction: Wet with Vashe we to dry. Zetuvit Plus 4x8 in Discharge Instruction: Apply over primary dressing as directed. Secured With [employer] 4x5 (in/yd) Discharge Instruction: Secure with Coban as directed. Kerlix Roll  Sterile, 4.5x3.1 (in/yd) Discharge Instruction: Secure with Kerlix as directed. Compression Wrap Compression Stockings Add-Ons Electronic Signature(s) Signed: 04/06/2023 10:17:35 AM By: [name] Entered By: [name] on 04/04/2023 10:32:04 -------------------------------------------------------------------------------- Wound Assessment Details Patient Name: Date of Service: [place], [name] 04/04/2023 10:15 A M Medical Record Number: [id number] Patient Account Number: [id number] Date of Birth/Sex: Treating RN: 03/27/1956 (67 y.o. [name] Primary Care Treyvonne Tata: Foye [name] Other Clinician: Referring Capri Raben: Treating Ani Deoliveira/Extender: Kandis Cocking in Treatment: 29 Wound Status Wound Number: 2 Primary Diabetic Wound/Ulcer of the Lower Extremity Etiology: Wound Location: Left, Dorsal Foot Wound Open Wounding Event: Pressure Injury Status: Date Acquired: 07/09/2022 Comorbid Cataracts, Sleep Apnea, Hypertension, Type II Diabetes, Weeks Of Treatment: 29 History: Osteomyelitis, Neuropathy Clustered Wound: No Photos [name], [name] ([id number]) [id number] Page 8 of 9 Wound Measurements Length: (cm) 3.2 Width: (cm) 3.4 Depth: (cm) 0.1 Area: (cm) 8.545 Volume: (cm) 0.855 % Reduction in Area: 9.3% % Reduction in Volume: 9.2% Epithelialization: Small (1-33%) Wound Description Classification: Grade 2 Wound Margin: Flat and Intact Exudate Amount: Medium Exudate Type: Serosanguineous Exudate Color: red, brown Foul Odor After Cleansing: No Slough/Fibrino Yes Wound Bed Granulation Amount: Small (1-33%) Exposed Structure Granulation Quality: Red, Pink Fascia Exposed: No Necrotic Amount: Large (67-100%) Fat Layer (Subcutaneous Tissue) Exposed: Yes Necrotic Quality: Eschar, Adherent Slough Tendon Exposed: No Muscle Exposed: No Joint Exposed: No Bone Exposed: No Periwound Skin Texture Texture  Color No Abnormalities Noted: Yes No Abnormalities Noted: Yes Moisture Temperature / Pain No Abnormalities Noted: No Temperature: No Abnormality Dry / Scaly: Yes Maceration: No Treatment Notes Wound #2 (Foot) Wound Laterality: Dorsal, Left Cleanser Soap and Water Discharge Instruction: May shower and wash wound with dial antibacterial soap and water prior to dressing change. Vashe 5.8 (oz) Discharge Instruction: Cleanse the wound with Vashe prior to applying a clean dressing using gauze sponges, not tissue or cotton balls. Peri-Wound Care keystone Topical Primary Dressing Hydrofera Blue Ready Transfer Foam, 2.5x2.5 (in/in) Discharge Instruction: Apply directly to wound bed as directed MediHoney Gel, tube 1.5 (oz) Discharge Instruction: Apply to wound bed as instructed Secondary Dressing Zetuvit Plus 4x8 in Discharge Instruction: Apply over primary dressing as  directed. Secured With [employer] 4x5 (in/yd) Discharge Instruction: Secure with Coban as directed. [address] 4.5x3.1 (in/yd) [name], BLOWE [name] ([id number]) [id number] Page 9 of 9 Discharge Instruction: Secure with Kerlix as directed. Compression Wrap Compression Stockings Circaid Juxta Lite Compression Wrap Quantity: 1 Left Leg Compression Amount: 30-40 mmHg Discharge Instruction: Apply Circaid Juxta Lite Compression Wrap daily as instructed. Apply first thing in the morning, remove at night before bed. Add-Ons Electronic Signature(s) Signed: 04/06/2023 10:17:35 AM By: [name] Entered By: [name] on 04/04/2023 10:32:49 -------------------------------------------------------------------------------- Vitals Details Patient Name: Date of Service: [name], [name] 04/04/2023 10:15 A M Medical Record Number: [id number] Patient Account Number: [id number] Date of Birth/Sex: Treating RN: 09/01/1956 (67 y.o. [name] Primary Care Keyaan Lederman: Foye [name] Other Clinician: Referring Mccayla Shimada: Treating Brehanna Deveny/Extender: Kandis Cocking in Treatment: 29 Vital Signs Time Taken: 10:10 Temperature (F): 97.9 Height (in): 74 Pulse (bpm): 90 Weight (lbs): 245 Respiratory Rate (breaths/min): 18 Body Mass Index (BMI): 31.5 Blood Pressure (mmHg): 151/82 Capillary Blood Glucose (mg/dl): 91 Reference Range: 80 - 120 mg / dl Electronic Signature(s) Signed: 04/06/2023 10:17:35 AM By: [name] Entered By: [name] on 04/04/2023 10:12:53

## 2023-04-06 NOTE — Progress Notes (Signed)
RONTRELL, MOQUIN ([id number]) [id number] Page 1 of 12 Visit Report for 04/04/2023 Chief Complaint Document Details Patient Name: Date of Service: [name], AKEN [name] 04/04/2023 10:15 A M Medical Record Number: [id number] Patient Account Number: [id number] Date of Birth/Sex: Treating RN: 04-Oct-1956 (67 y.o. M) Primary Care Provider: Foye [name] Other Clinician: Referring Provider: Treating Provider/Extender: Kandis Cocking in Treatment: 29 Information Obtained from: Patient Chief Complaint Patients presents for treatment of an open diabetic ulcer and evaluation for hyperbaric oxygen therapy Electronic Signature(s) Signed: 04/04/2023 11:16:55 AM By: [name] MD FACS Entered By: [name] on 04/04/2023 11:16:55 -------------------------------------------------------------------------------- Debridement Details Patient Name: Date of Service: [name], RO [name] [name]. 04/04/2023 10:15 A M Medical Record Number: [id number] Patient Account Number: [id number] Date of Birth/Sex: Treating RN: 07-May-1956 (67 y.o. [name] Primary Care Provider: Foye [name] Other Clinician: Referring Provider: Treating Provider/Extender: Kandis Cocking in Treatment: 29 Debridement Performed for Assessment: Wound #2 Left,Dorsal Foot Performed By: Physician [name], MD Debridement Type: Debridement Severity of Tissue Pre Debridement: Fat layer exposed Level of Consciousness (Pre-procedure): Awake and Alert Pre-procedure Verification/Time Out Yes - 10:40 Taken: Start Time: 10:41 Pain Control: Lidocaine 4% T opical Solution Percent of Wound Bed Debrided: 100% T Area Debrided (cm): otal 8.54 Tissue and other material debrided: Non-Viable, Slough, Slough Level: Non-Viable Tissue Debridement Description: Selective/Open Wound Instrument: Curette Bleeding: Minimum Hemostasis Achieved: Pressure End  Time: 10:42 Procedural Pain: 0 Post Procedural Pain: 0 Response to Treatment: Procedure was tolerated well Level of Consciousness (Post- Awake and Alert procedure): Post Debridement Measurements of Total Wound Length: (cm) 3.2 Width: (cm) 3.4 Depth: (cm) 0.1 Volume: (cm) 0.855 Character of Wound/Ulcer Post Debridement: Stable Severity of Tissue Post Debridement: Fat layer exposed Post Procedure Diagnosis Same as Pre-procedure Notes Scribed for Dr. Lady Gary by [name] RN. RAFFAELE, DERISE ([id number]) [id number] Page 2 of 12 Electronic Signature(s) Signed: 04/04/2023 12:48:34 PM By: [name] MD FACS Signed: 04/06/2023 10:17:35 AM By: [name] Entered By: [name] on 04/04/2023 10:46:03 -------------------------------------------------------------------------------- Debridement Details Patient Name: Date of Service: [name] [name]. 04/04/2023 10:15 A M Medical Record Number: [id number] Patient Account Number: [id number] Date of Birth/Sex: Treating RN: [birth year] (67 y.o. [name] Primary Care Provider: Foye [name] Other Clinician: Referring Provider: Treating Provider/Extender: Kandis Cocking in Treatment: 29 Debridement Performed for Assessment: Wound #1 Left Calcaneus Performed By: Physician [name], MD Debridement Type: Debridement Severity of Tissue Pre Debridement: Fat layer exposed Level of Consciousness (Pre-procedure): Awake and Alert Pre-procedure Verification/Time Out Yes - 10:40 Taken: Start Time: 10:41 Pain Control: Lidocaine 4% T opical Solution Percent of Wound Bed Debrided: 100% T Area Debrided (cm): otal 26.02 Tissue and other material debrided: Viable, Non-Viable, Callus, Slough, Skin: Dermis , Skin: Epidermis, Slough Level: Skin/Epidermis Debridement Description: Selective/Open Wound Instrument: Curette Bleeding: Minimum Hemostasis Achieved:  Pressure End Time: 10:42 Procedural Pain: 0 Post Procedural Pain: 0 Response to Treatment: Procedure was tolerated well Level of Consciousness (Post- Awake and Alert procedure): Post Debridement Measurements of Total Wound Length: (cm) 6.5 Width: (cm) 5.1 Depth: (cm) 0.5 Volume: (cm) 13.018 Character of Wound/Ulcer Post Debridement: Requires Further Debridement Severity of Tissue Post Debridement: Fat layer exposed Post Procedure Diagnosis Same as Pre-procedure Notes Scribed for Dr. Lady Gary by [name] RN. Electronic Signature(s) Signed: 04/04/2023 12:48:34 PM By: [name] MD FACS Signed: 04/06/2023 10:17:35 AM By: [name] Entered By: [name] on 04/04/2023 10:46:26 -------------------------------------------------------------------------------- HPI Details Patient Name:  Date of Service: [name]. 04/04/2023 10:15 A M Medical Record Number: [id number] Patient Account Number: [id number] Date of Birth/Sex: Treating RN: Jul 08, 1956 (67 y.o. M) Primary Care Provider: Foye [name] Other Clinician: Referring Provider: Treating Provider/Extender: Kandis Cocking in Treatment: [address] History of Present Illness [name] ([id number]) [id number] Page 3 of 12 HPI Description: ADMISSION 09/10/2022 This is a 67 year old poorly controlled type II diabetic (last hemoglobin A1c 10.8%) who has had an ulcer on his heel for over 3 years. He has been seen in multiple wound care centers, including [facility] and [facility]. He reports that at least 3 doctors have recommended that he undergo below-knee amputation. He most recently met with Dr. Reuel Boom, a vascular surgeon affiliated with [facility]. 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 Unfortunately, the patient has some extenuating social circumstances including the  fact that he cares for his wife who has stage IV colon cancer and still works, driving vehicles for [employer]. 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 He was recently hospitalized at [facility] for sepsis and currently has a PICC line through which he receives IV antibiotics. He reports having had another MRI during that hospital stay along with a chest x-ray and EKG. He apparently contacted one of the hyperbaric therapy techs here and asked a number of questions about hyperbaric oxygen treatments. He subsequently self-referred to our center to undergo further evaluation and management. I mention to him that [facility] actually has hyperbaric chambers, but he states that he lives in [place] and this would be more convenient for him given the intensive nature of the therapy and time requirement. ABI in clinic today was 0.94. The patient actually has 2 wounds. There is a wound on the dorsum of his left foot with heavy black eschar and slough present. After debridement, this was demonstrated to involve the muscle and the extensor tendons are exposed. On his heel, there is essentially a "shark bite" type wound, with much of the heel fat pad absent. The muscle layer is exposed. There is blue-green staining around the perimeter of the wound, but no significant odor. He says he has been applying collagen to the wound on his heel and Silvadene and Betadine to the wound on his dorsal foot. 09/20/2022: The heel wound is quite macerated with wet periwound callus. There is slough accumulation on the surface. The dorsal foot wound looks better this week. There is still exposed tendon, but it is fairly clean with just a little biofilm buildup. We are still working on gathering the required documentation to submit for pretreatment review for hyperbaric oxygen therapy. 09/29/2022: The dorsal foot wound continues to improve. I do not see any exposed tendon at this point. There is  just some slough accumulation on the wound surface. He continues to have very wet macerated periwound callus on his heel. There is slough on the surface, but it is loose and thin. There is an area of undermining at the 11 o'clock position, but the overlying skin and subcutaneous tissue is healthy and viable. He has been approved for hyperbaric oxygen therapy and will start treatment tomorrow. 10/06/2022: Continued contraction and improvement of the dorsal foot wound. There is just a little bit of slough accumulation on the wound surface. The periwound callus continues to accumulate on the heel wound and it is quite macerated. It is also persistently  found with blue-green staining present. He did initiate his hyperbaric oxygen therapy, but has had significant difficulty with decompression. He will be going to ENT to have PE tubes placed. 10/18/2022: His hyperbaric oxygen therapy is on hold while his otological issues are being addressed. He came in again today with the periwound skin on both the dorsal aspect of his foot and his calcaneus completely macerated. The blue-green discoloration, however, has abated and the undermining on the calcaneus has improved. The dorsal foot wound has also contracted somewhat. 10/26/2022: The dorsal foot wound continues to contract and fill with good granulation tissue. The heel, once again, has a rim of macerated callus, but the undermining and tunneling continues to contract. He has completed his oral antibiotics and has been using the [facility] topical compounded antibiotic for his dressing changes at home. He is scheduled to see ENT this afternoon. 11/08/2022: The dorsal foot wound is flush with the surrounding skin and has a good granulation tissue surface. There is some slough accumulation. As usual, the heel has a rim of macerated callus but the dimensions are smaller and the tunneling and undermining have contracted further. He has resumed his hyperbaric oxygen  therapy. 12/12; patient seen for wound evaluation. He is tolerating HBO although we could not dive him yesterday because of relative hypoglycemia and the fact he had given himself NovoLog insulin before he came to clinic. Today his blood sugar is in the 180 range she should be fine. He has a large wound on the plantar calcaneus on the right and a more superficial area on the dorsal foot. He is using a scooter for offloading 11/30/2022: The dorsal foot wound continues to contract. There is some slough on the wound surface. The large calcaneus wound has heaped up wet callus around the margins. Continued undermining. 12/08/2022: The dorsal foot wound is flat and flush with the surrounding skin surface. There is some slough present. Once again, the calcaneal wound has thick, absolutely macerated callus hanging off in tatters around the edges. The patient cannot explain to me why this part of his wound gets so wet. There is slough on the surface. The undermined portion of the wound has filled in. 12/17/2022: Earlier this week, he presented for his hyperbaric oxygen therapy and was hypotensive and ill-appearing. He also had what appeared to be an infected sebaceous cyst on his right upper arm. He was sent to the emergency department. He was given a fluid bolus and the ED provider lanced the cyst. He was prescribed doxycycline. He seems to be feeling better today. He notes that he has had copious drainage from his foot. On further questioning, he states that he has not been taking his oral diuretic. The dorsal foot wound has expanded somewhat, but is actually more superficial. The plantar heel wound is about the same. 12/27/2022: The dorsal foot wound has epithelialized further. The plantar foot wound is about the same size but has heaped up macerated callus around the perimeter. The intake nurse noted a tiny fragment of bone on his dressings and there is now an area at about the 5 o'clock position where 1 can  palpate bone through a small slit in the soft tissues. The wound on his arm is contracting but he still likely has cyst wall present due to the manner in which the infected sebaceous cyst was addressed in the ER. 01/01/2023: The wound on his shoulder has contracted considerably. The dorsal foot wound has some slough on the surface but also looks  to be improving. The plantar foot wound is basically unchanged. 01/10/2023: The wound on his shoulder continues to diminish in size and depth. The dorsal foot wound is improving with just a light layer of slough on the surface. The plantar foot wound appears to be contracting but still has thick wet callus around the perimeter. We did in chamber T cPO2 monitoring during his last hyperbaric oxygen therapy. The results show that he has extremely poor tissue perfusion at room air and 1 atm of pressure. He does respond extremely well to hyperbaric oxygen therapy, however. 01/17/2023: The wound on his shoulder is down to 0.6 cm in depth, down from 1 cm last week. The dorsal foot wound is quite a bit smaller this week with just some light slough on the surface. The plantar foot wound is also smaller and the area of bone that was exposed at the posterior heel is now covered. There is still substantial wet callus around the perimeter. He is scheduled to undergo formal vascular studies sometime next week. 01/24/2023: The wound on his shoulder has almost completely filled in, with just a little bit of depth remaining. The dorsal foot wound got macerated; it appears that his home health nurses are applying the drape for his heel wound over the top of this, meaning that the dorsal foot wound is not getting changed as regularly as it is supposed to. There is some slough on the surface. The plantar foot wound has thick wet callus around the perimeter. The dimensions measure a little bit smaller. He is having his vascular studies done this afternoon. 02/07/2023: The wound on his  shoulder is healed. The dorsal foot wound continues to be subjected to excessive moisture due to the home health nurses applying the drape from his wound VAC over the top of it. It does measure smaller, however. There is more bone exposed on his calcaneus, unfortunately. He has heaped up wet callus around the edges of the heel wound again. He is going to undergo angiography with Dr. Lenell Antu on Friday. Hopefully there will be intervention available to him that may aid in his wound healing. 02/14/2023: He did not get his angiogram last week with Dr. Lenell Antu because he did not have a ride home. The dorsal foot wound looks a little bit better with just a light layer of slough. Unfortunately, there is more bone exposed at the calcaneus and I am concerned that we are nearing a point at which his leg will not be salvageable. MIKI, [name] ([id number]) [id number] Page 4 of 12 3/18; patient presents for follow-up. He was recently hospitalized on 3/11 for sepsis secondary to diabetic foot wound. He was dishcharged with Augmentin and doxycycline and plan is for 6 weeks of this. He currently has a wound VAC. Wound has declined in appearance. Amputation was recommended in the hospital however patient is not ready for this. Patient states he would like to stop HBO. He currently denies systemic signs of infection. 03/07/2023: The calcaneal wound continues to deteriorate. There is significant undermining around the posterior aspect of the calcaneus and bone is palpable all the way around. The dorsal foot wound has some leathery eschar and slough present. 03/14/2023: The dorsal foot wound is somewhat leathery with some dark eschar and slough present. The calcaneal wound is deteriorating further. There are fragments of the bone hanging loose. He does have an appointment with Dr. Lenell Antu next week on Tuesday to discuss amputation. 04/04/2023: The heel ulcer is larger, but shallower. There is  heaped  up with callus around the edges. The dorsal foot wound is stable. He has appointment with Dr. Lenell Antu was rescheduled for next Tuesday. He is working on arrangements for his dog and his wife to facilitate below-knee amputation. Electronic Signature(s) Signed: 04/04/2023 11:18:22 AM By: [name] MD FACS Entered By: [name] on 04/04/2023 11:18:22 -------------------------------------------------------------------------------- Physical Exam Details Patient Name: Date of Service: [place], RO [name] [name]. 04/04/2023 10:15 A M Medical Record Number: [id number] Patient Account Number: [id number] Date of Birth/Sex: Treating RN: [birth year] (67 y.o. M) Primary Care Provider: Foye [name] Other Clinician: Referring Provider: Treating Provider/Extender: Kandis Cocking in Treatment: 29 Constitutional Hypertensive, asymptomatic. . . . no acute distress. Respiratory Normal work of breathing on room air. Notes 04/04/2023: The heel ulcer is larger, but shallower. There is heaped up with callus around the edges. The dorsal foot wound is stable. Electronic Signature(s) Signed: 04/04/2023 12:48:34 PM By: [name] MD FACS Entered By: [name] on 04/04/2023 11:19:33 -------------------------------------------------------------------------------- Physician Orders Details Patient Name: Date of Service: [place], [name] [name] [name]. 04/04/2023 10:15 A M Medical Record Number: [id number] Patient Account Number: [id number] Date of Birth/Sex: Treating RN: [birth year] (67 y.o. 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 - Dr. Lady Gary rm 2 Return A Bathing/ Shower/ Hygiene May shower with protection but do not get wound dressing(s) wet. Protect dressing(s) with water repellant cover (for example, large plastic bag) or a cast cover and may then take shower. Edema Control - Lymphedema / SCD / Other WELDEN, HAUSMANN ([id number]) [id number] Page 5 of 12 Left Lower Extremity Elevate legs to the level of the heart or above for 30 minutes daily and/or when sitting for 3-4 times a day throughout the day. Avoid standing for long periods of time. Moisturize legs daily. Compression stocking or Garment 30-40 mm/Hg pressure to: - left leg daily Off-Loading Wound #1 Left Calcaneus Other: - use knee scooter to ambulate, minimal weight bearing right foot Wound Treatment Wound #1 - Calcaneus Wound Laterality: Left Cleanser: Soap and Water 1 x Per Day/30 Days Discharge Instructions: May shower and wash wound with dial antibacterial soap and water prior to dressing change. Cleanser: Vashe 5.8 (oz) 1 x Per Day/30 Days Discharge Instructions: Cleanse the wound with Vashe prior to applying a clean dressing using gauze sponges, not tissue or cotton balls. Peri-Wound Care: Zinc Oxide Ointment 30g tube 1 x Per Day/30 Days Discharge Instructions: Apply Zinc Oxide to periwound with each dressing change Topical: keystone 1 x Per Day/30 Days Secondary Dressing: Woven Gauze Sponge, Non-Sterile 4x4 in (Generic) 1 x Per Day/30 Days Discharge Instructions: Wet with Vashe we to dry. Secondary Dressing: Zetuvit Plus 4x8 in (Dispense As Written) 1 x Per Day/30 Days Discharge Instructions: Apply over primary dressing as directed. Secured With: Coban Self-Adherent Wrap 4x5 (in/yd) (Generic)  1 x Per Day/30 Days Discharge Instructions: Secure with Coban as directed. Secured With: [employer], 4.5x3.1 (in/yd) (Generic) 1 x Per Day/30 Days Discharge Instructions: Secure with Kerlix as directed.  Wound #2 - Foot Wound Laterality: Dorsal, Left Cleanser: Soap and Water 1 x Per Day/30 Days Discharge Instructions: May shower and wash wound with dial antibacterial soap and water prior to dressing change. Cleanser: Vashe 5.8 (oz) 1 x Per Day/30 Days Discharge Instructions: Cleanse the wound with Vashe prior to applying a clean dressing using gauze sponges, not tissue or cotton balls. Peri-Wound Care: keystone 1 x Per Day/30 Days Prim Dressing: Hydrofera Blue Ready Transfer Foam, 2.5x2.5 (in/in) (Dispense As Written) 1 x Per Day/30 Days ary Discharge Instructions: Apply directly to wound bed as directed Prim Dressing: MediHoney Gel, tube 1.5 (oz) 1 x Per Day/30 Days ary Discharge Instructions: Apply to wound bed as instructed Secondary Dressing: Zetuvit Plus 4x8 in 1 x Per Day/30 Days Discharge Instructions: Apply over primary dressing as directed. Secured With: Coban Self-Adherent Wrap 4x5 (in/yd) 1 x Per Day/30 Days Discharge Instructions: Secure with Coban as directed. Secured With: [employer], 4.5x3.1 (in/yd) 1 x Per Day/30 Days Discharge Instructions: Secure with Kerlix as directed. Compression Stockings: Circaid Juxta Lite Compression Wrap Left Leg Compression Amount: 30-40 mmHG Discharge Instructions: Apply Circaid Juxta Lite Compression Wrap daily as instructed. Apply first thing in the morning, remove at night before bed. Electronic Signature(s) Signed: 04/04/2023 12:48:34 PM By: [name] MD FACS Entered By: [name] on 04/04/2023 11:19:52 -------------------------------------------------------------------------------- Problem List Details Patient Name: Date of Service: [place], [name] [name] [name]. 04/04/2023 10:15 A Dwan Bolt, Faye Ramsay ([id number])  [id number] Page 6 of 12 Medical Record Number: [id number] Patient Account Number: [id number] Date of Birth/Sex: Treating RN: 10-20-1956 (67 y.o. 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 09/10/2022 No Yes L97.523 Non-pressure chronic ulcer of other part of left foot with necrosis of muscle 09/10/2022 No Yes N82.956 Other chronic osteomyelitis, left ankle and foot 09/10/2022 No Yes E11.65 Type 2 diabetes mellitus with hyperglycemia 09/10/2022 No Yes E11.621 Type 2 diabetes mellitus with foot ulcer 09/10/2022 No Yes Inactive Problems ICD-10 Code Description Active Date Inactive Date L72.3 Sebaceous cyst 12/17/2022 12/17/2022 Resolved Problems ICD-10 Code Description Active Date Resolved Date L98.492 Non-pressure chronic ulcer of skin of other sites with fat layer exposed 12/17/2022 01/03/2023 Electronic Signature(s) Signed: 04/04/2023 11:16:34 AM By: [name] MD FACS Entered By: [name] on 04/04/2023 11:16:34 -------------------------------------------------------------------------------- Progress Note Details Patient Name: Date of Service: [name], RO [name] [name]. 04/04/2023 10:15 A M Medical Record Number: [id number] Patient Account Number: [id number] Date of Birth/Sex: Treating RN: 14-Jan-1956 (67 y.o. M) Primary Care Provider: Foye [name] Other Clinician: Referring Provider: Treating Provider/Extender: Kandis Cocking in Treatment: 29 Subjective Chief Complaint Information obtained from Patient Patients presents for treatment of an open diabetic ulcer and evaluation for hyperbaric oxygen therapy History of Present Illness  (HPI) ADMISSION 09/10/2022 JOSEGUADALUPE, STAN ([id number]) [id number] Page 7 of 12 This is a 67 year old poorly controlled type II diabetic (last hemoglobin A1c 10.8%) who has had an ulcer on his heel for over 3 years. He has been seen in multiple wound care centers, including [facility] and [facility]. He reports that at least 3 doctors have recommended that he undergo below-knee amputation. He most recently met with Dr. Reuel Boom, a vascular surgeon affiliated with [facility]. Vascular studies were done and demonstrated that he had adequate perfusion  to heal a below-knee amputation. Unfortunately, the patient has some extenuating social circumstances including the fact that he cares for his wife who has stage IV colon cancer and still works, driving vehicles for [employer]. 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 He was recently hospitalized at [facility] for sepsis and currently has a PICC line through which he receives IV antibiotics. He reports having had another MRI during that hospital stay along with a chest x-ray and EKG. He apparently contacted one of the hyperbaric therapy techs here and asked a number of questions about hyperbaric oxygen treatments. He subsequently self-referred to our center to undergo further evaluation and management. I mention to him that [facility] actually has hyperbaric chambers, but he states that he lives in [place] and this would be more convenient for him given the intensive nature of the therapy and time requirement. ABI in clinic today was 0.94. The patient actually has 2 wounds. There is a wound on the dorsum of his left foot with heavy black eschar and slough present. After debridement, this was demonstrated to involve the muscle and the extensor tendons are exposed. On his heel, there is essentially a "shark bite" type wound, with much of the heel fat pad absent. The  muscle layer is exposed. There is blue-green staining around the perimeter of the wound, but no significant odor. He says he has been applying collagen to the wound on his heel and Silvadene and Betadine to the wound on his dorsal foot. 09/20/2022: The heel wound is quite macerated with wet periwound callus. There is slough accumulation on the surface. The dorsal foot wound looks better this week. There is still exposed tendon, but it is fairly clean with just a little biofilm buildup. We are still working on gathering the required documentation to submit for pretreatment review for hyperbaric oxygen therapy. 09/29/2022: The dorsal foot wound continues to improve. I do not see any exposed tendon at this point. There is just some slough accumulation on the wound surface. He continues to have very wet macerated periwound callus on his heel. There is slough on the surface, but it is loose and thin. There is an area of undermining at the 11 o'clock position, but the overlying skin and subcutaneous tissue is healthy and viable. He has been approved for hyperbaric oxygen therapy and will start treatment tomorrow. 10/06/2022: Continued contraction and improvement of the dorsal foot wound. There is just a little bit of slough accumulation on the wound surface. The periwound callus continues to accumulate on the heel wound and it is quite macerated. It is also persistently found with blue-green staining present. He did initiate his hyperbaric oxygen therapy, but has had significant difficulty with decompression. He will be going to ENT to have PE tubes placed. 10/18/2022: His hyperbaric oxygen therapy is on hold while his otological issues are being addressed. He came in again today with the periwound skin on both the dorsal aspect of his foot and his calcaneus completely macerated. The blue-green discoloration, however, has abated and the undermining on the calcaneus has improved. The dorsal foot wound has also  contracted somewhat. 10/26/2022: The dorsal foot wound continues to contract and fill with good granulation tissue. The heel, once again, has a rim of macerated callus, but the undermining and tunneling continues to contract. He has completed his oral antibiotics and has been using the [facility] topical compounded antibiotic for his dressing changes at home. He is scheduled to see  ENT this afternoon. 11/08/2022: The dorsal foot wound is flush with the surrounding skin and has a good granulation tissue surface. There is some slough accumulation. As usual, the heel has a rim of macerated callus but the dimensions are smaller and the tunneling and undermining have contracted further. He has resumed his hyperbaric oxygen therapy. 12/12; patient seen for wound evaluation. He is tolerating HBO although we could not dive him yesterday because of relative hypoglycemia and the fact he had given himself NovoLog insulin before he came to clinic. Today his blood sugar is in the 180 range she should be fine. He has a large wound on the plantar calcaneus on the right and a more superficial area on the dorsal foot. He is using a scooter for offloading 11/30/2022: The dorsal foot wound continues to contract. There is some slough on the wound surface. The large calcaneus wound has heaped up wet callus around the margins. Continued undermining. 12/08/2022: The dorsal foot wound is flat and flush with the surrounding skin surface. There is some slough present. Once again, the calcaneal wound has thick, absolutely macerated callus hanging off in tatters around the edges. The patient cannot explain to me why this part of his wound gets so wet. There is slough on the surface. The undermined portion of the wound has filled in. 12/17/2022: Earlier this week, he presented for his hyperbaric oxygen therapy and was hypotensive and ill-appearing. He also had what appeared to be an infected sebaceous cyst on his right upper arm. He  was sent to the emergency department. He was given a fluid bolus and the ED provider lanced the cyst. He was prescribed doxycycline. He seems to be feeling better today. He notes that he has had copious drainage from his foot. On further questioning, he states that he has not been taking his oral diuretic. The dorsal foot wound has expanded somewhat, but is actually more superficial. The plantar heel wound is about the same. 12/27/2022: The dorsal foot wound has epithelialized further. The plantar foot wound is about the same size but has heaped up macerated callus around the perimeter. The intake nurse noted a tiny fragment of bone on his dressings and there is now an area at about the 5 o'clock position where 1 can palpate bone through a small slit in the soft tissues. The wound on his arm is contracting but he still likely has cyst wall present due to the manner in which the infected sebaceous cyst was addressed in the ER. 01/01/2023: The wound on his shoulder has contracted considerably. The dorsal foot wound has some slough on the surface but also looks to be improving. The plantar foot wound is basically unchanged. 01/10/2023: The wound on his shoulder continues to diminish in size and depth. The dorsal foot wound is improving with just a light layer of slough on the surface. The plantar foot wound appears to be contracting but still has thick wet callus around the perimeter. We did in chamber T cPO2 monitoring during his last hyperbaric oxygen therapy. The results show that he has extremely poor tissue perfusion at room air and 1 atm of pressure. He does respond extremely well to hyperbaric oxygen therapy, however. 01/17/2023: The wound on his shoulder is down to 0.6 cm in depth, down from 1 cm last week. The dorsal foot wound is quite a bit smaller this week with just some light slough on the surface. The plantar foot wound is also smaller and the area of bone that  was exposed at the posterior heel  is now covered. There is still substantial wet callus around the perimeter. He is scheduled to undergo formal vascular studies sometime next week. 01/24/2023: The wound on his shoulder has almost completely filled in, with just a little bit of depth remaining. The dorsal foot wound got macerated; it appears that his home health nurses are applying the drape for his heel wound over the top of this, meaning that the dorsal foot wound is not getting changed as regularly as it is supposed to. There is some slough on the surface. The plantar foot wound has thick wet callus around the perimeter. The dimensions measure a little bit smaller. He is having his vascular studies done this afternoon. 02/07/2023: The wound on his shoulder is healed. The dorsal foot wound continues to be subjected to excessive moisture due to the home health nurses applying the drape from his wound VAC over the top of it. It does measure smaller, however. There is more bone exposed on his calcaneus, unfortunately. He has heaped up wet callus around the edges of the heel wound again. He is going to undergo angiography with Dr. Lenell Antu on Friday. Hopefully there will be intervention available to him that may aid in his wound healing. 02/14/2023: He did not get his angiogram last week with Dr. Lenell Antu because he did not have a ride home. The dorsal foot wound looks a little bit better with just a light layer of slough. Unfortunately, there is more bone exposed at the calcaneus and I am concerned that we are nearing a point at which his leg will not be salvageable. 3/18; patient presents for follow-up. He was recently hospitalized on 3/11 for sepsis secondary to diabetic foot wound. He was dishcharged with Augmentin and doxycycline and plan is for 6 weeks of this. He currently has a wound VAC. Wound has declined in appearance. Amputation was recommended in the hospital HOLDEN, MANISCALCO ([id number]) [id number] Page 8  of 12 however patient is not ready for this. Patient states he would like to stop HBO. He currently denies systemic signs of infection. 03/07/2023: The calcaneal wound continues to deteriorate. There is significant undermining around the posterior aspect of the calcaneus and bone is palpable all the way around. The dorsal foot wound has some leathery eschar and slough present. 03/14/2023: The dorsal foot wound is somewhat leathery with some dark eschar and slough present. The calcaneal wound is deteriorating further. There are fragments of the bone hanging loose. He does have an appointment with Dr. Lenell Antu next week on Tuesday to discuss amputation. 04/04/2023: The heel ulcer is larger, but shallower. There is heaped up with callus around the edges. The dorsal foot wound is stable. He has appointment with Dr. Lenell Antu was rescheduled for next Tuesday. He is working on arrangements for his dog and his wife to facilitate below-knee amputation. Patient History Family History Cancer - Father,Mother, Diabetes - Mother,Father, Lung Disease - Father, Thyroid Problems - Mother, No family history of Heart Disease, Hereditary Spherocytosis, Hypertension, Kidney Disease, Seizures, Stroke, Tuberculosis. Social History Never smoker, Marital Status - Married, Alcohol Use - Never, Drug Use - No History, Caffeine Use - Daily. Medical History Eyes Patient has history of Cataracts Denies history of Glaucoma, Optic Neuritis Ear/Nose/Mouth/Throat Denies history of Chronic sinus problems/congestion, Middle ear problems Respiratory Patient has history of Sleep Apnea Cardiovascular Patient has history of Hypertension Gastrointestinal Denies history of Cirrhosis , Colitis, Crohnoos, Hepatitis A, Hepatitis B, Hepatitis C Endocrine Patient has history  of Type II Diabetes Immunological Denies history of Lupus Erythematosus, Raynaudoos, Scleroderma Musculoskeletal Patient has history of Osteomyelitis -  2023 Neurologic Patient has history of Neuropathy - Bila lower extremities Oncologic Denies history of Received Chemotherapy, Received Radiation Psychiatric Denies history of Anorexia/bulimia, Confinement Anxiety Hospitalization/Surgery History - I and D Left calcaneus. - back surgery- laminectomy. - eye surgery- Bila cataracts. - shoulder arthroscopy. Medical A Surgical History Notes nd Cardiovascular hyperlipidemia Genitourinary AKI Objective Constitutional Hypertensive, asymptomatic. no acute distress. Vitals Time Taken: 10:10 AM, Height: 74 in, Weight: 245 lbs, BMI: 31.5, Temperature: 97.9 F, Pulse: 90 bpm, Respiratory Rate: 18 breaths/min, Blood Pressure: 151/82 mmHg, Capillary Blood Glucose: 91 mg/dl. Respiratory Normal work of breathing on room air. General Notes: 04/04/2023: The heel ulcer is larger, but shallower. There is heaped up with callus around the edges. The dorsal foot wound is stable. Integumentary (Hair, Skin) Wound #1 status is Open. Original cause of wound was Pressure Injury. The date acquired was: 03/09/2019. The wound has been in treatment 29 weeks. The wound is located on the Left Calcaneus. The wound measures 6.5cm length x 5.1cm width x 0.5cm depth; 26.036cm^2 area and 13.018cm^3 volume. There is bone and Fat Layer (Subcutaneous Tissue) exposed. There is no tunneling or undermining noted. There is a large amount of serosanguineous drainage noted. The wound margin is thickened. There is large (67-100%) pink, pale granulation within the wound bed. There is a small (1-33%) amount of necrotic tissue within the wound bed including Adherent Slough. The periwound skin appearance exhibited: Callus, Maceration, Erythema. The periwound skin appearance did not exhibit: Dry/Scaly, Rubor. The surrounding wound skin color is noted with erythema. Periwound temperature was noted as No Abnormality. Wound #2 status is Open. Original cause of wound was Pressure Injury. The date  acquired was: 07/09/2022. The wound has been in treatment 29 weeks. The wound is located on the Left,Dorsal Foot. The wound measures 3.2cm length x 3.4cm width x 0.1cm depth; 8.545cm^2 area and 0.855cm^3 volume. There is Fat Layer (Subcutaneous Tissue) exposed. There is a medium amount of serosanguineous drainage noted. The wound margin is flat and intact. There is small (1-33%) red, pink granulation within the wound bed. There is a large (67-100%) amount of necrotic tissue within the wound bed including Eschar and Adherent Slough. The periwound skin appearance had no abnormalities noted for texture. The periwound skin appearance had no abnormalities noted for color. The HO, PARISI ([id number]) [id number] Page 9 of 12 periwound skin appearance exhibited: Dry/Scaly. The periwound skin appearance did not exhibit: Maceration. Periwound temperature was noted as No Abnormality. 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 There was a Selective/Open Wound Skin/Epidermis Debridement with a total area of 26.02 sq cm performed by [name], MD. With the following instrument(s): Curette to remove Viable and Non-Viable tissue/material. Material removed includes Callus, Slough, Skin: Dermis, and Skin: Epidermis after achieving pain control using Lidocaine 4% Topical Solution. No specimens were taken. A time out was conducted at 10:40, prior to the start of the procedure. A Minimum amount of bleeding was  controlled with Pressure. The  procedure was tolerated well with a pain level of 0 throughout and a pain level of 0 following the procedure. Post Debridement Measurements: 6.5cm length x 5.1cm width x 0.5cm depth; 13.018cm^3 volume. Character of Wound/Ulcer Post Debridement requires further debridement. Severity of Tissue Post Debridement is: Fat layer exposed. Post procedure Diagnosis Wound #1: Same as Pre-Procedure General Notes: Scribed for Dr. Lady Gary by [name] RN.Marland Kitchen Wound #2 Pre-procedure diagnosis of Wound #2 is a Diabetic Wound/Ulcer of the Lower Extremity located on the Left,Dorsal Foot .Severity of Tissue Pre Debridement is: Fat layer exposed. There was a Selective/Open Wound Non-Viable Tissue Debridement with a total area of 8.54 sq cm performed by [name], MD. With the following instrument(s): Curette to remove Non-Viable tissue/material. Material removed includes [facility] after achieving pain control using Lidocaine 4% T opical Solution. No specimens were taken. A time out was conducted at 10:40, prior to the start of the procedure. A Minimum amount of bleeding was controlled with Pressure. The procedure was tolerated well with a pain level of 0 throughout and a pain level of 0 following the procedure. Post Debridement Measurements: 3.2cm length x 3.4cm width x 0.1cm depth; 0.855cm^3 volume. Character of Wound/Ulcer Post Debridement is stable. Severity of Tissue Post Debridement is: Fat layer exposed. Post procedure Diagnosis Wound #2: Same as Pre-Procedure General Notes: Scribed for Dr. Lady Gary by [name] RN.Marland Kitchen Plan Follow-up Appointments: Return Appointment in 1 week. - Dr. Lady Gary rm 2 Bathing/ Shower/ Hygiene: May shower with protection but do not get wound dressing(s) wet. Protect dressing(s) with water repellant cover (for example, large plastic bag) or a cast cover and may then take shower. Edema Control - Lymphedema / SCD / Other: Elevate legs to the level of  the heart or above for 30 minutes daily and/or when sitting for 3-4 times a day throughout the day. Avoid standing for long periods of time. Moisturize legs daily. Compression stocking or Garment 30-40 mm/Hg pressure to: - left leg daily Off-Loading: Wound #1 Left Calcaneus: Other: - use knee scooter to ambulate, minimal weight bearing right foot WOUND #1: - Calcaneus Wound Laterality: Left Cleanser: Soap and Water 1 x Per Day/30 Days Discharge Instructions: May shower and wash wound with dial antibacterial soap and water prior to dressing change. Cleanser: Vashe 5.8 (oz) 1 x Per Day/30 Days Discharge Instructions: Cleanse the wound with Vashe prior to applying a clean dressing using gauze sponges, not tissue or cotton balls. Peri-Wound Care: Zinc Oxide Ointment 30g tube 1 x Per Day/30 Days Discharge Instructions: Apply Zinc Oxide to periwound with each dressing change Topical: keystone 1 x Per Day/30 Days Secondary Dressing: Woven Gauze Sponge, Non-Sterile 4x4 in (Generic) 1 x Per Day/30 Days Discharge Instructions: Wet with Vashe we to dry. Secondary Dressing: Zetuvit Plus 4x8 in (Dispense As Written) 1 x Per Day/30 Days Discharge Instructions: Apply over primary dressing as directed. Secured With: Coban Self-Adherent Wrap 4x5 (in/yd) (Generic) 1 x Per Day/30 Days Discharge Instructions: Secure with Coban as directed. Secured With: [employer], 4.5x3.1 (in/yd) (Generic) 1 x Per Day/30 Days Discharge Instructions: Secure with Kerlix as directed. WOUND #2: - Foot Wound Laterality: Dorsal, Left Cleanser: Soap and Water 1 x Per Day/30 Days Discharge Instructions: May shower and wash wound with dial antibacterial soap and water prior to dressing change. Cleanser: Vashe 5.8 (oz) 1 x Per Day/30 Days Discharge Instructions: Cleanse the wound with Vashe prior to applying a clean dressing using gauze sponges, not tissue or cotton balls. Peri-Wound Care: [facility]  1 x Per Day/30 Days Prim  Dressing: Hydrofera Blue Ready Transfer Foam, 2.5x2.5 (in/in) (Dispense As Written) 1 x Per Day/30 Days EARMON, SHERROW ([id number]) [id number] Page 10 of 12 Discharge Instructions: Apply directly to wound bed as directed Prim Dressing: MediHoney Gel, tube 1.5 (oz) 1 x Per Day/30 Days ary Discharge Instructions: Apply to wound bed as instructed Secondary Dressing: Zetuvit Plus 4x8 in 1 x Per Day/30 Days Discharge Instructions: Apply over primary dressing as directed. Secured With: Coban Self-Adherent Wrap 4x5 (in/yd) 1 x Per Day/30 Days Discharge Instructions: Secure with Coban as directed. Secured With: [employer], 4.5x3.1 (in/yd) 1 x Per Day/30 Days Discharge Instructions: Secure with Kerlix as directed. Com pression Stockings: Circaid Juxta Lite Compression Wrap Compression Amount: 30-40 mmHg (left) Discharge Instructions: Apply Circaid Juxta Lite Compression Wrap daily as instructed. Apply first thing in the morning, remove at night before bed. 04/04/2023: The heel ulcer is larger, but shallower. There is heaped up with callus around the edges. The dorsal foot wound is stable. I used a curette to debride callus, skin, and slough from the heel and slough from the dorsal foot. Will continue to use his Keystone topical antibiotic compound (prescription drug) to both sites. Continue Vashe-moistened gauze to the heel and Hydrofera Blue ready foam to the dorsal foot. The patient understands that this is a nonsalvageable situation discharged to serve as necessary so that he can undergo BKA. Will continue to assist him with his wound care until that time. Electronic Signature(s) Signed: 04/04/2023 11:21:15 AM By: [name] MD FACS Entered By: [name] on 04/04/2023 11:21:15 -------------------------------------------------------------------------------- HxROS Details Patient Name: Date of Service: [name], RO [name] [name]. 04/04/2023 10:15 A  M Medical Record Number: [id number] Patient Account Number: [id number] Date of Birth/Sex: Treating RN: [birth year] (67 y.o. M) Primary Care Provider: Foye [name] Other Clinician: Referring Provider: Treating Provider/Extender: Kandis Cocking in Treatment: 29 Eyes Medical History: Positive for: Cataracts Negative for: Glaucoma; Optic Neuritis Ear/Nose/Mouth/Throat Medical History: Negative for: Chronic sinus problems/congestion; Middle ear problems Respiratory Medical History: Positive for: Sleep Apnea Cardiovascular Medical History: Positive for: Hypertension Past Medical History Notes: hyperlipidemia Gastrointestinal Medical History: Negative for: Cirrhosis ; Colitis; Crohns; Hepatitis A; Hepatitis B; Hepatitis C Endocrine Medical History: Positive for: Type II Diabetes Time with diabetes: 20 years Treated with: Insulin Blood sugar tested every day: Yes Tested : 2-3 [address] KHRIS, JANSSON [name] ([id number]) [id number] Page 11 of 12 Medical History: Past Medical History Notes: AKI Immunological Medical History: Negative for: Lupus Erythematosus; Raynauds; Scleroderma Musculoskeletal Medical History: Positive for: Osteomyelitis - 2023 Neurologic Medical History: Positive for: Neuropathy - Bila lower extremities Oncologic Medical History: Negative for: Received Chemotherapy; Received Radiation Psychiatric Medical History: Negative for: Anorexia/bulimia; Confinement Anxiety HBO Extended History Items Eyes: Cataracts Immunizations Pneumococcal Vaccine: Received Pneumococcal Vaccination: Yes Received Pneumococcal Vaccination On or After 60th Birthday: Yes Implantable Devices Yes Hospitalization / Surgery History Type of Hospitalization/Surgery I and D Left calcaneus back surgery- laminectomy eye surgery- Bila cataracts shoulder arthroscopy Family and Social History Cancer: Yes - Father,Mother;  Diabetes: Yes - Mother,Father; Heart Disease: No; Hereditary Spherocytosis: No; Hypertension: No; Kidney Disease: No; Lung Disease: Yes - Father; Seizures: No; Stroke: No; Thyroid Problems: Yes - Mother; Tuberculosis: No; Never smoker; Marital Status - Married; Alcohol Use: Never; Drug Use: No History; Caffeine Use: Daily; Financial Concerns: No; Food, Clothing or Shelter Needs: No; Support System Lacking: No; Transportation Concerns: No Psychologist, prison and probation services) Signed: 04/04/2023 12:48:34 PM By: [name] MD FACS  Entered By: [name] on 04/04/2023 11:19:04 -------------------------------------------------------------------------------- SuperBill Details Patient Name: Date of Service: [place], [name] 04/04/2023 Medical Record Number: [id number] Patient Account Number: [id number] Date of Birth/Sex: Treating RN: August 05, 1956 (67 y.o. [name] Primary Care Provider: Foye [name] Other Clinician: Referring Provider: Treating Provider/Extender: Kandis Cocking in Treatment: [address] 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 L97.523 Non-pressure chronic ulcer of other part of left foot with necrosis of 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, left ankle and foot E11.65 Type 2 diabetes mellitus with hyperglycemia E11.621 Type 2 diabetes mellitus with foot ulcer Facility Procedures : The patient participates with Medicare or their insurance follows the Medicare Facility Guidelines: CPT4 Code Description Modifier Quantity [id number] - DEBRIDE WOUND 1ST 20 SQ CM OR < 1 ICD-10 Diagnosis Description L97.424 Non-pressure chronic ulcer  of left heel and midfoot with necrosis of bone L97.523 Non-pressure chronic ulcer of other part of left foot with necrosis of muscle : The patient  participates with Medicare or their insurance follows the Medicare Facility Guidelines: [id number] - DEBRIDE WOUND EA ADDL 20 SQ CM 1 ICD-10 Diagnosis Description 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 L97.523 Non-pressure chronic ulcer of other part of left foot with necrosis of muscle Physician Procedures : CPT4 Code Description Modifier [id number] - WC PHYS LEVEL 4 - EST PT 25 ICD-10 Diagnosis Description 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, 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 Quantity: 1 : [id number] - WC PHYS DEBR WO ANESTH 20 SQ CM ICD-10 Diagnosis Description 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

## 2023-04-11 NOTE — Progress Notes (Unsigned)
[facility] [place]  ASSESSMENT / PLAN: [name] is a 67 y.o. 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 Blood glucose control with goal A1c < 7%. Blood pressure control with goal blood pressure < 140/90 mmHg. Lipid reduction therapy with goal LDL-C <100 mg/dL (<41 if symptomatic from PAD).  Aspirin 81mg  PO QD.  Atorvastatin 40-80mg  PO QD (or other "high intensity" statin therapy). equate.  Plan left lower extremity angiogram with possible intervention via common femoral approach in cath lab 02/11/2023.   CHIEF COMPLAINT: Ulcer about right lower extremity  HISTORY OF PRESENT ILLNESS: [name] is a 67 y.o. male who has been struggling with chronic osteomyelitis of the left heel.I counseled him extensively about this problem and the limb threatening problem he is facing. We had a long discussion about this problem and that even with a successful revascularization, he may need a BKA.  He strongly desires limb salvage.  Past Medical History:  Diagnosis Date   Diabetic foot ulcers (HCC)    DM2 (diabetes mellitus, type 2) (HCC)    HTN (hypertension)    Sleep apnea    No Cpap    Past Surgical History:  Procedure Laterality Date   ABDOMINAL AORTOGRAM W/LOWER EXTREMITY Left 02/18/2023   Procedure: ABDOMINAL AORTOGRAM W/LOWER EXTREMITY;  Surgeon: Leonie Douglas, MD;  Location: [facility];  Service: Cardiovascular;  Laterality: Left;   BACK SURGERY     Laminectomy-Bilateral   EYE SURGERY     Bilateral cataracts and eye implants   I & D EXTREMITY Left 09/21/2019   Procedure: LEFT PARTIAL CALCANEOUS EXCISION;  Surgeon: Nadara Mustard, MD;  Location: [facility] OR;  Service: Orthopedics;  Laterality: Left;   KNEE  ARTHROSCOPY Left    SHOULDER ARTHROSCOPY     Right    No family history on file.  Social History   Socioeconomic History   Marital status: Married    Spouse name: Not on file   Number of children: Not on file   Years of education: Not on file   Highest education level: Not on file  Occupational History   Not on file  Tobacco Use   Smoking status: Never   Smokeless tobacco: Never  Vaping Use   Vaping Use: Never used  Substance and Sexual Activity   Alcohol use: Yes    Comment: rarely   Drug use: Never   Sexual activity: Not on file  Other Topics Concern   Not on file  Social History Narrative   Not on file   Social Determinants of Health   Financial Resource Strain: High Risk (03/08/2023)   Overall Financial Resource Strain (CARDIA)    Difficulty of Paying Living Expenses: Hard  Food Insecurity: No Food Insecurity (03/15/2023)   Hunger Vital Sign    Worried About Running Out of Food in the Last Year: Never true    Ran Out of Food in the Last Year: Never true  Transportation Needs: No Transportation Needs (03/16/2023)   PRAPARE - Administrator, Civil Service (Medical): No    Lack of Transportation (Non-Medical): No  Physical Activity: Inactive (03/16/2023)   Exercise Vital Sign    Days of Exercise per Week: 0 days    Minutes of Exercise per Session: 0  min  Stress: Stress Concern Present (03/16/2023)   [employer] of Occupational Health - Occupational Stress Questionnaire    Feeling of Stress : Rather much  Social Connections: Not on file  Intimate Partner Violence: Not At Risk (02/22/2023)   Humiliation, Afraid, Rape, and Kick questionnaire    Fear of Current or Ex-Partner: No    Emotionally Abused: No    Physically Abused: No    Sexually Abused: No    Allergies  Allergen Reactions   Bee Venom Anaphylaxis, Swelling and Rash         Cefepime Rash    Unclear if due to cefepime    Current Outpatient Medications  Medication Sig Dispense Refill    aspirin EC 81 MG tablet Take 81 mg by mouth daily.      atorvastatin (LIPITOR) 10 MG tablet Take 10 mg by mouth at bedtime.     B-D UF III MINI PEN NEEDLES 31G X 5 MM MISC USE AS DIRECTED UP TO 3 TIMES DAILY     Cholecalciferol (VITAMIN D3) 50 MCG (2000 UT) CAPS Take 2,000 Units by mouth daily.     Continuous Blood Gluc Sensor (FREESTYLE LIBRE 14 DAY SENSOR) MISC Use to check blood glucose     cyanocobalamin (VITAMIN B12) 1000 MCG tablet Take 1,000 mcg by mouth daily.     EPINEPHrine (EPIPEN 2-PAK) 0.3 mg/0.3 mL IJ SOAJ injection Inject 0.3 mg into the muscle as needed for anaphylaxis.      ferrous sulfate 325 (65 FE) MG tablet Take 325 mg by mouth daily with breakfast.     furosemide (LASIX) 40 MG tablet Take 40 mg by mouth daily.      losartan (COZAAR) 100 MG tablet Take 100 mg by mouth daily.     NOVOLOG FLEXPEN 100 UNIT/ML FlexPen Inject 5-11 Units into the skin 3 (three) times daily with meals. Sliding scale     oxyCODONE (OXY IR/ROXICODONE) 5 MG immediate release tablet Take 1 tablet (5 mg total) by mouth every 4 (four) hours as needed for moderate pain, severe pain or breakthrough pain. 30 tablet 0   potassium chloride (K-DUR) 10 MEQ tablet Take 10 mEq by mouth daily.  0   propranolol (INDERAL) 20 MG tablet Take 20 mg by mouth 2 (two) times daily.     TRESIBA FLEXTOUCH 200 UNIT/ML SOPN Inject 50-85 Units into the skin See admin instructions. Take 85 units in the morning and 50 units at bedtime     No current facility-administered medications for this visit.    PHYSICAL EXAM There were no vitals filed for this visit.   Chronically ill-appearing man in no distress Regular rate and rhythm Unlabored breathing No pedal pulses L foot bandage with VAC  PERTINENT LABORATORY AND RADIOLOGIC DATA  Most recent CBC    Latest Ref Rng & Units 02/23/2023    4:09 AM 02/22/2023    8:58 AM 02/21/2023    8:30 PM  CBC  WBC 4.0 - 10.5 K/uL 4.2  4.9  8.3   Hemoglobin 13.0 - 17.0 g/dL 9.7  91.4   78.2   Hematocrit 39.0 - 52.0 % 29.7  31.4  34.3   Platelets 150 - 400 K/uL 210  198  220      Most recent CMP    Latest Ref Rng & Units 02/23/2023    4:09 AM 02/22/2023    8:58 AM 02/21/2023    8:30 PM  CMP  Glucose 70 - 99 mg/dL 956  213  137   BUN 8 - 23 mg/dL 19  20  27    Creatinine 0.61 - 1.24 mg/dL 1.61  0.96  0.45   Sodium 135 - 145 mmol/L 134  134  132   Potassium 3.5 - 5.1 mmol/L 3.9  3.8  3.7   Chloride 98 - 111 mmol/L 104  103  101   CO2 22 - 32 mmol/L 22  22  25    Calcium 8.9 - 10.3 mg/dL 8.5  8.6  9.0   Total Protein 6.5 - 8.1 g/dL 6.6  6.7  8.1   Total Bilirubin 0.3 - 1.2 mg/dL 0.7  1.0  1.8   Alkaline Phos 38 - 126 U/L 60  64  81   AST 15 - 41 U/L 11  11  13    ALT 0 - 44 U/L 12  11  10     Right: 30-49% stenosis noted in the iliac segment. Diffuse  atherosclerosis.   Left: 30-49% stenosis noted in the iliac segment. 30-49% stenosis noted in  the common femoral artery. 50-74% stenosis noted in the superficial  femoral artery. 50-74% stenosis noted in the popliteal artery. He has  stenosis in the EIA CFA SFA and popliteal   artery.   Rande Brunt. Lenell Antu, MD FACS [facility] Phone Number: [phone] 04/11/2023 10:17 AM   Total time spent on preparing this encounter including chart review, data review, collecting history, examining the patient, coordinating care for this new patient, 60 minutes.  Portions of this report may have been transcribed using voice recognition software.  Every effort has been made to ensure accuracy; however, inadvertent computerized transcription errors may still be present.

## 2023-04-12 ENCOUNTER — Ambulatory Visit (INDEPENDENT_AMBULATORY_CARE_PROVIDER_SITE_OTHER): Payer: Medicare Other | Admitting: Vascular Surgery

## 2023-04-12 ENCOUNTER — Encounter: Payer: Self-pay | Admitting: Vascular Surgery

## 2023-04-12 ENCOUNTER — Encounter (HOSPITAL_BASED_OUTPATIENT_CLINIC_OR_DEPARTMENT_OTHER): Payer: Medicare Other | Admitting: General Surgery

## 2023-04-12 VITALS — BP 133/83 | HR 78 | Temp 98.9°F | Resp 20 | Ht 74.0 in | Wt 249.0 lb

## 2023-04-12 DIAGNOSIS — M8660 Other chronic osteomyelitis, unspecified site: Secondary | ICD-10-CM

## 2023-04-12 DIAGNOSIS — E11621 Type 2 diabetes mellitus with foot ulcer: Secondary | ICD-10-CM | POA: Diagnosis not present

## 2023-04-13 ENCOUNTER — Ambulatory Visit: Payer: Self-pay | Admitting: *Deleted

## 2023-04-13 NOTE — Progress Notes (Signed)
[name], [name] ([id number]) [id number] Page 1 of 9 Visit Report for 04/12/2023 Arrival Information Details Patient Name: Date of Service: [name], [name]. 04/12/2023 10:15 A M Medical Record Number: [id number] Patient Account Number: [id number] Date of Birth/Sex: Treating RN: [birth year] (67 y.o. [name] Primary Care [name]: [name] Other Clinician: Referring [name]: Treating [name]/Extender: [name] in Treatment: 30 Visit Information History Since Last Visit Added or deleted any medications: No Patient Arrived: Knee Scooter Any new allergies or adverse reactions: No Arrival Time: 10:30 Had a fall or experienced change in No Accompanied By: self activities of daily living that may affect Transfer Assistance: Manual risk of falls: Patient Requires Transmission-Based Precautions: No Signs or symptoms of abuse/neglect since last visito No Patient Has Alerts: No Hospitalized since last visit: No Implantable device outside of the clinic excluding No cellular tissue based products placed in the center since last visit: Has Dressing in Place as Prescribed: Yes Pain Present Now: No Electronic Signature(s) Signed: 04/12/2023 3:22:44 PM By: [name] Entered By: [name] on 04/12/2023 10:33:24 -------------------------------------------------------------------------------- Encounter Discharge Information Details Patient Name: Date of Service: [name], [name]. 04/12/2023 10:15 A M Medical Record Number: [id number] Patient Account Number: [id number] Date of Birth/Sex: Treating RN: August 06, 1956 (67 y.o. [name] Primary Care [name]: [name] Other Clinician: Referring [name]: Treating [name]/Extender: [name] in Treatment: 30 Encounter Discharge Information Items Post Procedure Vitals Discharge Condition: Stable Temperature  (F): 97.8 Ambulatory Status: Knee Scooter Pulse (bpm): 89 Discharge Destination: Home Respiratory Rate (breaths/min): 18 Transportation: Private Auto Blood Pressure (mmHg): 161/82 Accompanied By: self Schedule Follow-up Appointment: Yes Clinical Summary of Care: Patient Declined Electronic Signature(s) Signed: 04/12/2023 3:22:44 PM By: [name] Entered By: [name] on 04/12/2023 11:25:48 -------------------------------------------------------------------------------- Lower Extremity Assessment Details Patient Name: Date of Service: [name], [name]. 04/12/2023 10:15 A M Medical Record Number: [id number] Patient Account Number: [id number] Date of Birth/Sex: Treating RN: Dec 30, 1955 (67 y.o. [name] Primary Care Taetum Flewellen: [name] Other Clinician: Referring Angeleah Labrake: Treating [name]: [name] Weeks in Treatment: 30 Edema Assessment Assessed: [name]: No] [name]: No] S[LeftFOX, SALMINEN [name] ([id number]: [id number] Page 2 of 9] Edema: [Left: Ye] [Right: s] Calf Left: Right: Point of Measurement: From Medial Instep 43.5 cm Ankle Left: Right: Point of Measurement: From Medial Instep 26 cm Electronic Signature(s) Signed: 04/12/2023 3:22:44 PM By: [name] Entered By: [name] on 04/12/2023 10:36:30 -------------------------------------------------------------------------------- Multi Wound Chart Details Patient Name: Date of Service: [name], [name]. 04/12/2023 10:15 A M Medical Record Number: [id number] Patient Account Number: [id number] Date of Birth/Sex: Treating RN: [birth year] (67 y.o. M) Primary Care [name]: [name] Other Clinician: Referring Lilybelle Mayeda: Treating [name]/Extender: [name] in Treatment: 30 Vital Signs Height(in): 74 Capillary Blood Glucose(mg/dl): 433 Weight(lbs): 295 Pulse(bpm):  89 Body Mass Index(BMI): 31.5 Blood Pressure(mmHg): 161/82 Temperature(F): 97.8 Respiratory Rate(breaths/min): 18 [1:Photos:] [N/A:N/A] Left Calcaneus Left, Dorsal Foot N/A Wound Location: Pressure Injury Pressure Injury N/A Wounding Event: Diabetic Wound/Ulcer of the Lower Diabetic Wound/Ulcer of the Lower N/A Primary Etiology: Extremity Extremity Cataracts, Sleep Apnea, Hypertension, Cataracts, Sleep Apnea, Hypertension, N/A Comorbid History: Type II Diabetes, Osteomyelitis, Type II Diabetes, Osteomyelitis, Neuropathy Neuropathy 03/09/2019 07/09/2022 N/A Date Acquired: 30 30 N/A Weeks of Treatment: Open Open N/A Wound Status: No No N/A Wound Recurrence: 5.7x4.6x0.7 3.4x3x0.1 N/A Measurements L x W x D (cm) 20.593 8.011 N/A A (cm) : rea 14.415 0.801 N/A  Volume (cm) : 4.70% 15.00% N/A % Reduction in A rea: 33.30% 15.00% N/A % Reduction in Volume: 4 Starting Position 1 (o'clock): 7 Ending Position 1 (o'clock): 0.7 Maximum Distance 1 (cm): 2 Starting Position 2 (o'clock): 3 Ending Position 2 (o'clock): 0.5 Maximum Distance 2 (cm): Yes N/A N/A Undermining: Grade 3 Grade 2 N/A Classification: Large Medium N/A Exudate A mount: Serosanguineous Serosanguineous N/A Exudate Type: red, brown red, brown N/A Exudate Color: Thickened Flat and Intact N/A Wound Margin: Large (67-100%) Small (1-33%) N/A Granulation A mount: Pink, Pale Red, Pink N/A Granulation Quality: Small (1-33%) Large (67-100%) N/A Necrotic A mount: Adherent [name], Adherent [name] N/A Necrotic Tissue: [name], [name] ([id number]) [phone].pdf Page 3 of 9 Fat Layer (Subcutaneous Tissue): Yes Fat Layer (Subcutaneous Tissue): Yes N/A Exposed Structures: Fascia: No Fascia: No Tendon: No Tendon: No Muscle: No Muscle: No Joint: No Joint: No Bone: No Bone: No Small (1-33%) Small (1-33%) N/A Epithelialization: Debridement - Selective/Open Wound  Debridement - Excisional N/A Debridement: Pre-procedure Verification/Time Out 10:47 10:47 N/A Taken: [employer], Slough N/A Tissue Debrided: Skin/Epidermis Skin/Subcutaneous Tissue N/A Level: 20.58 8.01 N/A Debridement A (sq cm): rea Curette Curette N/A Instrument: Minimum Minimum N/A Bleeding: Pressure Pressure N/A Hemostasis A chieved: Procedure was tolerated well Procedure was tolerated well N/A Debridement Treatment Response: 5.7x4.6x0.7 3.4x3x0.1 N/A Post Debridement Measurements L x W x D (cm) 14.415 0.801 N/A Post Debridement Volume: (cm) Callus: Yes Scarring: Yes N/A Periwound Skin Texture: Maceration: Yes [name]/Scaly: Yes N/A Periwound Skin Moisture: [name]/Scaly: No Maceration: No Erythema: Yes Rubor: No N/A Periwound Skin Color: Rubor: No No Abnormality No Abnormality N/A Temperature: Debridement Debridement N/A Procedures Performed: Treatment Notes Wound #1 (Calcaneus) Wound Laterality: Left Cleanser Soap and Water Discharge Instruction: May shower and wash wound with dial antibacterial soap and water prior to dressing change. Vashe 5.8 (oz) Discharge Instruction: Cleanse the wound with Vashe prior to applying a clean dressing using gauze sponges, not tissue or cotton balls. Peri-Wound Care Zinc Oxide Ointment 30g tube Discharge Instruction: Apply Zinc Oxide to periwound with each dressing change Topical keystone Primary Dressing Secondary Dressing Woven Gauze Sponge, Non-Sterile 4x4 in Discharge Instruction: Wet with Vashe we to [name]. Zetuvit Plus 4x8 in Discharge Instruction: Apply over primary dressing as directed. Secured With [employer] 4x5 (in/yd) Discharge Instruction: Secure with Coban as directed. Kerlix Roll Sterile, 4.5x3.1 (in/yd) Discharge Instruction: Secure with Kerlix as directed. Compression Wrap Compression Stockings Add-Ons Wound #2 (Foot) Wound Laterality: Dorsal, Left Cleanser Soap and  Water Discharge Instruction: May shower and wash wound with dial antibacterial soap and water prior to dressing change. Vashe 5.8 (oz) Discharge Instruction: Cleanse the wound with Vashe prior to applying a clean dressing using gauze sponges, not tissue or cotton balls. Peri-Wound Care [address] OLANDA, DOWNIE [name] ([id number]) [id number] Page 4 of 9 Hydrofera Blue Ready Transfer Foam, 2.5x2.5 (in/in) Discharge Instruction: Apply directly to wound bed as directed MediHoney Gel, tube 1.5 (oz) Discharge Instruction: Apply to wound bed as instructed Secondary Dressing Zetuvit Plus 4x8 in Discharge Instruction: Apply over primary dressing as directed. Secured With [employer] 4x5 (in/yd) Discharge Instruction: Secure with Coban as directed. Kerlix Roll Sterile, 4.5x3.1 (in/yd) Discharge Instruction: Secure with Kerlix as directed. Compression Wrap Compression Stockings Circaid Juxta Lite Compression Wrap Quantity: 1 Left Leg Compression Amount: 30-40 mmHg Discharge Instruction: Apply Circaid Juxta Lite Compression Wrap daily as instructed. Apply first thing in the morning, remove at night before bed. Add-Ons Electronic Signature(s) Signed: 04/12/2023 11:55:32  AM By: [name] MD FACS Entered By: [name] on 04/12/2023 11:55:32 -------------------------------------------------------------------------------- Multi-Disciplinary Care Plan Details Patient Name: Date of Service: [place], [name]. 04/12/2023 10:15 A M Medical Record Number: [id number] Patient Account Number: [id number] Date of Birth/Sex: Treating RN: [birth year] (67 y.o. [name] Primary Care Payslee Bateson: [name] Other Clinician: Referring Tomoko Sandra: Treating Maesyn Frisinger/Extender: [name] in Treatment: 30 Multidisciplinary Care Plan reviewed with physician Active Inactive Wound/Skin  Impairment Nursing Diagnoses: Impaired tissue integrity Goals: Patient/caregiver will verbalize understanding of skin care regimen Date Initiated: 10/06/2022 Target Resolution Date: 05/13/2023 Goal Status: Active Ulcer/skin breakdown will have a volume reduction of 30% by week 4 Date Initiated: 09/10/2022 Date Inactivated: 10/06/2022 Target Resolution Date: 10/08/2022 Goal Status: Unmet Unmet Reason: osteo, HBOT Ulcer/skin breakdown will have a volume reduction of 50% by week 8 Date Initiated: 10/06/2022 Date Inactivated: 02/14/2023 Target Resolution Date: 01/14/2023 Unmet Reason: insufficient perfusion Goal Status: Unmet to Left lower extremity Ulcer/skin breakdown will have a volume reduction of 80% by week 12 Date Initiated: 02/14/2023 Date Inactivated: 03/14/2023 Target Resolution Date: 03/15/2023 Goal Status: Unmet Unmet Reason: osteomyelitis Interventions: Assess ulceration(s) every visit Provide education on ulcer and skin care Treatment Activities: Consult for HBO : 09/10/2022 [name], [name] ([id number]) [phone].pdf Page 5 of 9 Skin care regimen initiated : 09/10/2022 Notes: NPWT started 12/27/22 Electronic Signature(s) Signed: 04/12/2023 3:22:44 PM By: [name] Entered By: [name] on 04/12/2023 10:50:08 -------------------------------------------------------------------------------- Pain Assessment Details Patient Name: Date of Service: [place], [name]. 04/12/2023 10:15 A M Medical Record Number: [id number] Patient Account Number: [id number] Date of Birth/Sex: Treating RN: Feb 25, 1956 (67 y.o. [name] Primary Care Marlo Goodrich: [name] Other Clinician: Referring Zyree Traynham: Treating Porchea Charrier/Extender: [name] in Treatment: 30 Active Problems Location of Pain Severity and Description of Pain Patient Has Paino No Site Locations Rate the pain. Current Pain Level: 0 Pain  Management and Medication Current Pain Management: Electronic Signature(s) Signed: 04/12/2023 3:22:44 PM By: [name] Entered By: [name] on 04/12/2023 10:33:34 -------------------------------------------------------------------------------- Patient/Caregiver Education Details Patient Name: Date of Service: [name] [name] 4/30/2024andnbsp10:15 A M Medical Record Number: [id number] Patient Account Number: [id number] Date of Birth/Gender: Treating RN: 1956/01/23 (67 y.o. [name] Primary Care Physician: [name] Other Clinician: Referring Physician: Treating Physician/Extender: [name] in Treatment: 30 Education Assessment Education Provided To: Patient Education Topics Provided Wound/Skin Impairment: Methods: Explain/Verbal [name], [name] ([id number]) [id number] Page 6 of 9 Responses: Reinforcements needed, State content correctly Electronic Signature(s) Signed: 04/12/2023 3:22:44 PM By: [name] Entered By: [name] on 04/12/2023 10:50:18 -------------------------------------------------------------------------------- Wound Assessment Details Patient Name: Date of Service: [place], [name]. 04/12/2023 10:15 A M Medical Record Number: [id number] Patient Account Number: [id number] Date of Birth/Sex: Treating RN: 1956/09/13 (67 y.o. [name] Primary Care Valine Drozdowski: [name] Other Clinician: Referring Brystal Kildow: Treating Danaisha Celli/Extender: [name] in Treatment: 30 Wound Status Wound Number: 1 Primary Diabetic Wound/Ulcer of the Lower Extremity Etiology: Wound Location: Left Calcaneus Wound Open Wounding Event: Pressure Injury Status: Date Acquired: 03/09/2019 Comorbid Cataracts, Sleep Apnea, Hypertension, Type II Diabetes, Weeks Of Treatment: 30 History: Osteomyelitis, Neuropathy Clustered Wound:  No Photos Wound Measurements Length: (cm) 5.7 Width: (cm) 4.6 Depth: (cm) 0.7 Area: (cm) 20.593 Volume: (cm) 14.415 % Reduction in Area: 4.7% % Reduction in Volume: 33.3% Epithelialization: Small (1-33%) Tunneling: No Undermining: Yes Location 1 Starting Position (o'clock): 4 Ending Position (o'clock): 7 Maximum Distance: (  cm) 0.7 Location 2 Starting Position (o'clock): 2 Ending Position (o'clock): 3 Maximum Distance: (cm) 0.5 Wound Description Classification: Grade 3 Wound Margin: Thickened Exudate Amount: Large Exudate Type: Serosanguineous Exudate Color: red, brown Foul Odor After Cleansing: No Slough/Fibrino Yes Wound Bed Granulation Amount: Large (67-100%) Exposed Structure Granulation Quality: Pink, Pale Fascia Exposed: No Necrotic Amount: Small (1-33%) Fat Layer (Subcutaneous Tissue) Exposed: Yes Necrotic Quality: Adherent Slough Tendon Exposed: No Muscle Exposed: No Joint Exposed: No Bone Exposed: No [name], [name] ([id number]) [id number] Page 7 of 9 Periwound Skin Texture Texture Color No Abnormalities Noted: No No Abnormalities Noted: No Callus: Yes Erythema: Yes Rubor: No Moisture No Abnormalities Noted: No Temperature / Pain [name] / Scaly: No Temperature: No Abnormality Maceration: Yes Treatment Notes Wound #1 (Calcaneus) Wound Laterality: Left Cleanser Soap and Water Discharge Instruction: May shower and wash wound with dial antibacterial soap and water prior to dressing change. Vashe 5.8 (oz) Discharge Instruction: Cleanse the wound with Vashe prior to applying a clean dressing using gauze sponges, not tissue or cotton balls. Peri-Wound Care Zinc Oxide Ointment 30g tube Discharge Instruction: Apply Zinc Oxide to periwound with each dressing change Topical keystone Primary Dressing Secondary Dressing Woven Gauze Sponge, Non-Sterile 4x4 in Discharge Instruction: Wet with Vashe we to [name]. Zetuvit Plus 4x8  in Discharge Instruction: Apply over primary dressing as directed. Secured With [employer] 4x5 (in/yd) Discharge Instruction: Secure with Coban as directed. Kerlix Roll Sterile, 4.5x3.1 (in/yd) Discharge Instruction: Secure with Kerlix as directed. Compression Wrap Compression Stockings Add-Ons Electronic Signature(s) Signed: 04/12/2023 3:22:44 PM By: [name] Entered By: [name] on 04/12/2023 10:41:54 -------------------------------------------------------------------------------- Wound Assessment Details Patient Name: Date of Service: [place], [name]. 04/12/2023 10:15 A M Medical Record Number: [id number] Patient Account Number: [id number] Date of Birth/Sex: Treating RN: February 03, 1956 (67 y.o. [name] Primary Care Erbie Arment: [name] Other Clinician: Referring Lorelle Macaluso: Treating Pricsilla Lindvall/Extender: [name] in Treatment: 30 Wound Status Wound Number: 2 Primary Diabetic Wound/Ulcer of the Lower Extremity Etiology: Wound Location: Left, Dorsal Foot Wound Open Wounding Event: Pressure Injury Status: Date Acquired: 07/09/2022 Comorbid Cataracts, Sleep Apnea, Hypertension, Type II Diabetes, Weeks Of Treatment: 30 History: Osteomyelitis, Neuropathy Clustered Wound: No Photos [name], [name] ([id number]) [id number] Page 8 of 9 Wound Measurements Length: (cm) 3.4 Width: (cm) 3 Depth: (cm) 0.1 Area: (cm) 8.011 Volume: (cm) 0.801 % Reduction in Area: 15% % Reduction in Volume: 15% Epithelialization: Small (1-33%) Wound Description Classification: Grade 2 Wound Margin: Flat and Intact Exudate Amount: Medium Exudate Type: Serosanguineous Exudate Color: red, brown Foul Odor After Cleansing: No Slough/Fibrino Yes Wound Bed Granulation Amount: Small (1-33%) Exposed Structure Granulation Quality: Red, Pink Fascia Exposed: No Necrotic Amount: Large (67-100%) Fat  Layer (Subcutaneous Tissue) Exposed: Yes Necrotic Quality: Eschar, Adherent Slough Tendon Exposed: No Muscle Exposed: No Joint Exposed: No Bone Exposed: No Periwound Skin Texture Texture Color No Abnormalities Noted: Yes No Abnormalities Noted: Yes Moisture Temperature / Pain No Abnormalities Noted: No Temperature: No Abnormality [name] / Scaly: Yes Maceration: No Treatment Notes Wound #2 (Foot) Wound Laterality: Dorsal, Left Cleanser Soap and Water Discharge Instruction: May shower and wash wound with dial antibacterial soap and water prior to dressing change. Vashe 5.8 (oz) Discharge Instruction: Cleanse the wound with Vashe prior to applying a clean dressing using gauze sponges, not tissue or cotton balls. Peri-Wound Care keystone Topical Primary Dressing Hydrofera Blue Ready Transfer Foam, 2.5x2.5 (in/in) Discharge Instruction: Apply directly to wound bed as directed MediHoney Gel, tube 1.5 (  oz) Discharge Instruction: Apply to wound bed as instructed Secondary Dressing Zetuvit Plus 4x8 in Discharge Instruction: Apply over primary dressing as directed. Secured With [employer] 4x5 (in/yd) Discharge Instruction: Secure with Coban as directed. [address], 4.5x3.1 (in/yd) KEKAI, GETER [name] ([id number]) [id number] Page 9 of 9 Discharge Instruction: Secure with Kerlix as directed. Compression Wrap Compression Stockings Circaid Juxta Lite Compression Wrap Quantity: 1 Left Leg Compression Amount: 30-40 mmHg Discharge Instruction: Apply Circaid Juxta Lite Compression Wrap daily as instructed. Apply first thing in the morning, remove at night before bed. Add-Ons Electronic Signature(s) Signed: 04/12/2023 3:22:44 PM By: [name] Entered By: [name] on 04/12/2023 10:42:17 -------------------------------------------------------------------------------- Vitals Details Patient Name: Date of Service: [name], [name] NA  [name]. 04/12/2023 10:15 A M Medical Record Number: [id number] Patient Account Number: [id number] Date of Birth/Sex: Treating RN: September 25, 1956 (67 y.o. [name] Primary Care Yaa Donnellan: [name] Other Clinician: Referring Helio Lack: Treating Lambert Jeanty/Extender: [name] in Treatment: 30 Vital Signs Time Taken: 10:33 Temperature (F): 97.8 Height (in): 74 Pulse (bpm): 89 Weight (lbs): 245 Respiratory Rate (breaths/min): 18 Body Mass Index (BMI): 31.5 Blood Pressure (mmHg): 161/82 Capillary Blood Glucose (mg/dl): 086 Reference Range: 80 - 120 mg / dl Electronic Signature(s) Signed: 04/12/2023 3:22:44 PM By: [name] Entered By: [name] on 04/12/2023 10:34:01

## 2023-04-13 NOTE — Progress Notes (Signed)
KYMERE, FULLINGTON [name] ([id number]) [id number] Page 1 of 12 Visit Report for 04/12/2023 Chief Complaint Document Details Patient Name: Date of Service: [name], [name]. 04/12/2023 10:15 A M Medical Record Number: [id number] Patient Account Number: [id number] Date of Birth/Sex: Treating RN: 19-May-1956 (67 y.o. M) Primary Care Provider: Foye [name] Other Clinician: Referring Provider: Treating Provider/Extender: Kandis Cocking in Treatment: 30 Information Obtained from: Patient Chief Complaint Patients presents for treatment of an open diabetic ulcer and evaluation for hyperbaric oxygen therapy Electronic Signature(s) Signed: 04/12/2023 11:56:33 AM By: [name] MD FACS Entered By: [name] on 04/12/2023 11:56:33 -------------------------------------------------------------------------------- Debridement Details Patient Name: Date of Service: [name], [name] 04/12/2023 10:15 A M Medical Record Number: [id number] Patient Account Number: [id number] Date of Birth/Sex: Treating RN: Mar 16, 1956 (67 y.o. [name] Primary Care Provider: Foye [name] Other Clinician: Referring Provider: Treating Provider/Extender: Kandis Cocking in Treatment: 30 Debridement Performed for Assessment: Wound #2 Left,Dorsal Foot Performed By: Physician [name], MD Debridement Type: Debridement Severity of Tissue Pre Debridement: Fat layer exposed Level of Consciousness (Pre-procedure): Awake and Alert Pre-procedure Verification/Time Out Yes - 10:47 Taken: Start Time: 10:47 Percent of Wound Bed Debrided: 100% T Area Debrided (cm): otal 8.01 Tissue and other material debrided: Non-Viable, Slough, Subcutaneous, Slough Level: Skin/Subcutaneous Tissue Debridement Description: Excisional Instrument: Curette Bleeding: Minimum Hemostasis Achieved: Pressure Response to Treatment: Procedure was  tolerated well Level of Consciousness (Post- Awake and Alert procedure): Post Debridement Measurements of Total Wound Length: (cm) 3.4 Width: (cm) 3 Depth: (cm) 0.1 Volume: (cm) 0.801 Character of Wound/Ulcer Post Debridement: Improved Severity of Tissue Post Debridement: Fat layer exposed Post Procedure Diagnosis Same as Pre-procedure Notes Scribed for Dr Lady Gary by Samuella Bruin, RN Electronic Signature(s) Signed: 04/12/2023 3:22:44 PM By: Samuella Bruin Signed: 04/12/2023 3:54:11 PM By: [name] MD FACS Entered By: Samuella Bruin on 04/12/2023 10:50:29 [name] ([id number]) [id number] Page 2 of 12 -------------------------------------------------------------------------------- Debridement Details Patient Name: Date of Service: [name], [name]. 04/12/2023 10:15 A M Medical Record Number: [id number] Patient Account Number: [id number] Date of Birth/Sex: Treating RN: 01-18-1956 (67 y.o. [name] Primary Care Provider: Foye [name] Other Clinician: Referring Provider: Treating Provider/Extender: Kandis Cocking in Treatment: 30 Debridement Performed for Assessment: Wound #1 Left Calcaneus Performed By: Physician [name], MD Debridement Type: Debridement Severity of Tissue Pre Debridement: Fat layer exposed Level of Consciousness (Pre-procedure): Awake and Alert Pre-procedure Verification/Time Out Yes - 10:47 Taken: Start Time: 10:47 Percent of Wound Bed Debrided: 100% T Area Debrided (cm): otal 20.58 Tissue and other material debrided: Non-Viable, Slough, Skin: Epidermis, Slough Level: Skin/Epidermis Debridement Description: Selective/Open Wound Instrument: Curette Bleeding: Minimum Hemostasis Achieved: Pressure Response to Treatment: Procedure was tolerated well Level of Consciousness (Post- Awake and Alert procedure): Post Debridement Measurements of Total  Wound Length: (cm) 5.7 Width: (cm) 4.6 Depth: (cm) 0.7 Volume: (cm) 14.415 Character of Wound/Ulcer Post Debridement: Improved Severity of Tissue Post Debridement: Fat layer exposed Post Procedure Diagnosis Same as Pre-procedure Notes Scribed for Dr Lady Gary by Samuella Bruin, RN Electronic Signature(s) Signed: 04/12/2023 3:22:44 PM By: Samuella Bruin Signed: 04/12/2023 3:54:11 PM By: [name] MD FACS Entered By: Samuella Bruin on 04/12/2023 10:56:02 -------------------------------------------------------------------------------- HPI Details Patient Name: Date of Service: [name], [name] 04/12/2023 10:15 A M Medical Record Number: [id number] Patient Account Number: [id number] Date of Birth/Sex: Treating RN: [birth year] (67 y.o. M) Primary Care Provider: Foye [name] Other Clinician: Referring Provider: Treating Provider/Extender: Lady Gary  Alexis Goodell, Riley Lam Weeks in Treatment: 30 History of Present Illness HPI Description: ADMISSION 09/10/2022 This is a 67 year old poorly controlled type II diabetic (last hemoglobin A1c 10.8%) who has had an ulcer on his heel for over 3 years. He has been seen in multiple wound care centers, including [facility] and [facility]. He reports that at least 3 doctors have recommended that he undergo below-knee amputation. He most recently met with Dr. Reuel Boom, a vascular surgeon affiliated with [facility]. 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 Unfortunately, the patient has some extenuating social circumstances including the fact that he cares for his wife who has stage IV colon cancer and still works, driving vehicles for [employer]. 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 He was recently hospitalized at [facility] for sepsis and currently has a PICC line through which he receives IV antibiotics.  He reports having had another MRI during that hospital stay along with a chest x-ray and EKG. He apparently contacted one of the hyperbaric therapy techs here and asked a number of questions about hyperbaric oxygen treatments. He subsequently self-referred to our center to undergo further TAVARIOUS, FREEL ([id number]) [phone].pdf Page 3 of 12 evaluation and management. I mention to him that [facility] actually has hyperbaric chambers, but he states that he lives in [place] and this would be more convenient for him given the intensive nature of the therapy and time requirement. ABI in clinic today was 0.94. The patient actually has 2 wounds. There is a wound on the dorsum of his left foot with heavy black eschar and slough present. After debridement, this was demonstrated to involve the muscle and the extensor tendons are exposed. On his heel, there is essentially a "shark bite" type wound, with much of the heel fat pad absent. The muscle layer is exposed. There is blue-green staining around the perimeter of the wound, but no significant odor. He says he has been applying collagen to the wound on his heel and Silvadene and Betadine to the wound on his dorsal foot. 09/20/2022: The heel wound is quite macerated with wet periwound callus. There is slough accumulation on the surface. The dorsal foot wound looks better this week. There is still exposed tendon, but it is fairly clean with just a little biofilm buildup. We are still working on gathering the required documentation to submit for pretreatment review for hyperbaric oxygen therapy. 09/29/2022: The dorsal foot wound continues to improve. I do not see any exposed tendon at this point. There is just some slough accumulation on the wound surface. He continues to have very wet macerated periwound callus on his heel. There is slough on the surface, but it is loose and thin. There is an area of undermining at the 11 o'clock  position, but the overlying skin and subcutaneous tissue is healthy and viable. He has been approved for hyperbaric oxygen therapy and will start treatment tomorrow. 10/06/2022: Continued contraction and improvement of the dorsal foot wound. There is just a little bit of slough accumulation on the wound surface. The periwound callus continues to accumulate on the heel wound and it is quite macerated. It is also persistently found with blue-green staining present. He did initiate his hyperbaric oxygen therapy, but has had significant difficulty with decompression. He will be going to ENT to have PE tubes placed. 10/18/2022: His hyperbaric oxygen therapy is on hold while his otological  issues are being addressed. He came in again today with the periwound skin on both the dorsal aspect of his foot and his calcaneus completely macerated. The blue-green discoloration, however, has abated and the undermining on the calcaneus has improved. The dorsal foot wound has also contracted somewhat. 10/26/2022: The dorsal foot wound continues to contract and fill with good granulation tissue. The heel, once again, has a rim of macerated callus, but the undermining and tunneling continues to contract. He has completed his oral antibiotics and has been using the [facility] topical compounded antibiotic for his dressing changes at home. He is scheduled to see ENT this afternoon. 11/08/2022: The dorsal foot wound is flush with the surrounding skin and has a good granulation tissue surface. There is some slough accumulation. As usual, the heel has a rim of macerated callus but the dimensions are smaller and the tunneling and undermining have contracted further. He has resumed his hyperbaric oxygen therapy. 12/12; patient seen for wound evaluation. He is tolerating HBO although we could not dive him yesterday because of relative hypoglycemia and the fact he had given himself NovoLog insulin before he came to clinic. Today his  blood sugar is in the 180 range she should be fine. He has a large wound on the plantar calcaneus on the right and a more superficial area on the dorsal foot. He is using a scooter for offloading 11/30/2022: The dorsal foot wound continues to contract. There is some slough on the wound surface. The large calcaneus wound has heaped up wet callus around the margins. Continued undermining. 12/08/2022: The dorsal foot wound is flat and flush with the surrounding skin surface. There is some slough present. Once again, the calcaneal wound has thick, absolutely macerated callus hanging off in tatters around the edges. The patient cannot explain to me why this part of his wound gets so wet. There is slough on the surface. The undermined portion of the wound has filled in. 12/17/2022: Earlier this week, he presented for his hyperbaric oxygen therapy and was hypotensive and ill-appearing. He also had what appeared to be an infected sebaceous cyst on his right upper arm. He was sent to the emergency department. He was given a fluid bolus and the ED provider lanced the cyst. He was prescribed doxycycline. He seems to be feeling better today. He notes that he has had copious drainage from his foot. On further questioning, he states that he has not been taking his oral diuretic. The dorsal foot wound has expanded somewhat, but is actually more superficial. The plantar heel wound is about the same. 12/27/2022: The dorsal foot wound has epithelialized further. The plantar foot wound is about the same size but has heaped up macerated callus around the perimeter. The intake nurse noted a tiny fragment of bone on his dressings and there is now an area at about the 5 o'clock position where 1 can palpate bone through a small slit in the soft tissues. The wound on his arm is contracting but he still likely has cyst wall present due to the manner in which the infected sebaceous cyst was addressed in the ER. 01/01/2023: The  wound on his shoulder has contracted considerably. The dorsal foot wound has some slough on the surface but also looks to be improving. The plantar foot wound is basically unchanged. 01/10/2023: The wound on his shoulder continues to diminish in size and depth. The dorsal foot wound is improving with just a light layer of slough on the surface. The plantar  foot wound appears to be contracting but still has thick wet callus around the perimeter. We did in chamber T cPO2 monitoring during his last hyperbaric oxygen therapy. The results show that he has extremely poor tissue perfusion at room air and 1 atm of pressure. He does respond extremely well to hyperbaric oxygen therapy, however. 01/17/2023: The wound on his shoulder is down to 0.6 cm in depth, down from 1 cm last week. The dorsal foot wound is quite a bit smaller this week with just some light slough on the surface. The plantar foot wound is also smaller and the area of bone that was exposed at the posterior heel is now covered. There is still substantial wet callus around the perimeter. He is scheduled to undergo formal vascular studies sometime next week. 01/24/2023: The wound on his shoulder has almost completely filled in, with just a little bit of depth remaining. The dorsal foot wound got macerated; it appears that his home health nurses are applying the drape for his heel wound over the top of this, meaning that the dorsal foot wound is not getting changed as regularly as it is supposed to. There is some slough on the surface. The plantar foot wound has thick wet callus around the perimeter. The dimensions measure a little bit smaller. He is having his vascular studies done this afternoon. 02/07/2023: The wound on his shoulder is healed. The dorsal foot wound continues to be subjected to excessive moisture due to the home health nurses applying the drape from his wound VAC over the top of it. It does measure smaller, however. There is more bone  exposed on his calcaneus, unfortunately. He has heaped up wet callus around the edges of the heel wound again. He is going to undergo angiography with Dr. Lenell Antu on Friday. Hopefully there will be intervention available to him that may aid in his wound healing. 02/14/2023: He did not get his angiogram last week with Dr. Lenell Antu because he did not have a ride home. The dorsal foot wound looks a little bit better with just a light layer of slough. Unfortunately, there is more bone exposed at the calcaneus and I am concerned that we are nearing a point at which his leg will not be salvageable. 3/18; patient presents for follow-up. He was recently hospitalized on 3/11 for sepsis secondary to diabetic foot wound. He was dishcharged with Augmentin and doxycycline and plan is for 6 weeks of this. He currently has a wound VAC. Wound has declined in appearance. Amputation was recommended in the hospital however patient is not ready for this. Patient states he would like to stop HBO. He currently denies systemic signs of infection. 03/07/2023: The calcaneal wound continues to deteriorate. There is significant undermining around the posterior aspect of the calcaneus and bone is palpable all the way around. The dorsal foot wound has some leathery eschar and slough present. 03/14/2023: The dorsal foot wound is somewhat leathery with some dark eschar and slough present. The calcaneal wound is deteriorating further. There are fragments of the bone hanging loose. He does have an appointment with Dr. Lenell Antu next week on Tuesday to discuss amputation. [name], [name] ([id number]) [id number] Page 4 of 12 04/04/2023: The heel ulcer is larger, but shallower. There is heaped up with callus around the edges. The dorsal foot wound is stable. He has appointment with Dr. Lenell Antu was rescheduled for next Tuesday. He is working on arrangements for his dog and his wife to facilitate below-knee  amputation. 04/12/2023:  The heel ulcer looks about the same. Bone is not exposed. There is moist callus heaped up around the edges. The dorsal foot wound is stable. He will be seeing Dr. Lenell Antu this afternoon to discuss amputation. Electronic Signature(s) Signed: 04/12/2023 11:57:32 AM By: [name] MD FACS Entered By: [name] on 04/12/2023 11:57:32 -------------------------------------------------------------------------------- Physical Exam Details Patient Name: Date of Service: [place], [name] 04/12/2023 10:15 A M Medical Record Number: [id number] Patient Account Number: [id number] Date of Birth/Sex: Treating RN: March 11, 1956 (67 y.o. M) Primary Care Provider: Foye [name] Other Clinician: Referring Provider: Treating Provider/Extender: Kandis Cocking in Treatment: 30 Constitutional Hypertensive, asymptomatic. . . . no acute distress. Respiratory Normal work of breathing on room air. Notes 04/12/2023: The heel ulcer looks about the same. Bone is not exposed. There is moist callus heaped up around the edges. The dorsal foot wound is stable. Electronic Signature(s) Signed: 04/12/2023 12:01:46 PM By: [name] MD FACS Entered By: [name] on 04/12/2023 12:01:46 -------------------------------------------------------------------------------- Physician Orders Details Patient Name: Date of Service: Gay, [name] 04/12/2023 10:15 A M Medical Record Number: [id number] Patient Account Number: [id number] Date of Birth/Sex: Treating RN: Sep 08, 1956 (67 y.o. 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 - Dr. Lady Gary rm 2 Return A Bathing/ Shower/ Hygiene May shower with protection but do not get wound dressing(s) wet. Protect dressing(s) with water repellant cover (for example, large plastic bag) or a cast cover and may then take shower. Edema Control - Lymphedema / SCD / Other Left Lower Extremity Elevate legs to the level of the heart or above for 30 minutes daily and/or when sitting for 3-4 times a day throughout the day. Avoid standing for long periods of time. Moisturize legs daily. Compression stocking or Garment 30-40 mm/Hg pressure to: - left leg daily Off-Loading MOSTYN, VARNELL ([id number]) [phone].pdf Page 5 of 12 Wound #1 Left Calcaneus Other: - use knee scooter to ambulate, minimal weight bearing right foot Wound Treatment Wound #1 - Calcaneus Wound Laterality: Left Cleanser: Soap and Water 1 x Per Day/30 Days Discharge Instructions: May shower and wash wound with dial antibacterial soap and water prior to dressing change. Cleanser: Vashe 5.8 (oz) 1 x Per Day/30 Days Discharge Instructions: Cleanse the wound with Vashe prior to applying a clean dressing using gauze sponges, not tissue or cotton balls. Peri-Wound Care: Zinc Oxide Ointment 30g tube 1 x Per Day/30 Days Discharge Instructions: Apply Zinc Oxide to periwound with each dressing change Topical: keystone 1 x Per Day/30 Days Secondary Dressing: Woven Gauze Sponge, Non-Sterile 4x4 in (Generic) 1 x Per Day/30 Days Discharge Instructions: Wet with Vashe we to dry. Secondary Dressing: Zetuvit Plus 4x8 in (Dispense As Written) 1 x Per Day/30 Days Discharge Instructions: Apply over primary dressing as directed. Secured With: Coban  Self-Adherent Wrap 4x5 (in/yd) (Generic) 1 x Per Day/30 Days Discharge Instructions: Secure with Coban as directed. Secured With: [employer], 4.5x3.1 (in/yd) (Generic) 1 x Per Day/30 Days Discharge Instructions: Secure with Kerlix as  directed. Wound #2 - Foot Wound Laterality: Dorsal, Left Cleanser: Soap and Water 1 x Per Day/30 Days Discharge Instructions: May shower and wash wound with dial antibacterial soap and water prior to dressing change. Cleanser: Vashe 5.8 (oz) 1 x Per Day/30 Days Discharge Instructions: Cleanse the wound with Vashe prior to applying a clean dressing using gauze sponges, not tissue or cotton balls. Peri-Wound Care: keystone 1 x Per Day/30 Days Prim Dressing: Hydrofera Blue Ready Transfer Foam, 2.5x2.5 (in/in) (Dispense As Written) 1 x Per Day/30 Days ary Discharge Instructions: Apply directly to wound bed as directed Prim Dressing: MediHoney Gel, tube 1.5 (oz) 1 x Per Day/30 Days ary Discharge Instructions: Apply to wound bed as instructed Secondary Dressing: Zetuvit Plus 4x8 in 1 x Per Day/30 Days Discharge Instructions: Apply over primary dressing as directed. Secured With: Coban Self-Adherent Wrap 4x5 (in/yd) 1 x Per Day/30 Days Discharge Instructions: Secure with Coban as directed. Secured With: [employer], 4.5x3.1 (in/yd) 1 x Per Day/30 Days Discharge Instructions: Secure with Kerlix as directed. Compression Stockings: Circaid Juxta Lite Compression Wrap Left Leg Compression Amount: 30-40 mmHG Discharge Instructions: Apply Circaid Juxta Lite Compression Wrap daily as instructed. Apply first thing in the morning, remove at night before bed. Electronic Signature(s) Signed: 04/12/2023 3:54:11 PM By: [name] MD FACS Entered By: [name] on 04/12/2023 12:02:45 -------------------------------------------------------------------------------- Problem List Details Patient Name: Date of Service: [place], [name] 04/12/2023 10:15  A M Medical Record Number: [id number] Patient Account Number: [id number] Date of Birth/Sex: Treating RN: 02-28-1956 (67 y.o. M) Primary Care Provider: Foye [name] Other Clinician: Referring Provider: Treating Provider/Extender: Kandis Cocking in Treatment: 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, left ankle and foot 09/10/2022 No Yes E11.65 Type 2 diabetes mellitus with hyperglycemia 09/10/2022 No Yes E11.621 Type 2 diabetes mellitus with foot ulcer 09/10/2022 No Yes Inactive Problems ICD-10 Code Description Active Date Inactive Date L72.3 Sebaceous cyst 12/17/2022 12/17/2022 Resolved Problems ICD-10 Code Description Active Date Resolved Date L98.492 Non-pressure chronic ulcer of skin of other sites with fat layer exposed 12/17/2022 01/03/2023 Electronic Signature(s) Signed: 04/12/2023 11:55:19 AM By: [name] MD FACS Entered By: [name] on 04/12/2023 11:55:19 -------------------------------------------------------------------------------- Progress Note Details Patient Name: Date of Service: [name], [name] 04/12/2023 10:15 A M Medical Record Number: [id number] Patient Account Number: [id number] Date of Birth/Sex: Treating RN: [birth year] (67 y.o. M) Primary Care Provider: Foye [name] Other Clinician: Referring Provider: Treating Provider/Extender: Kandis Cocking in Treatment: 30 Subjective Chief Complaint Information obtained from Patient Patients presents for treatment of an open diabetic ulcer and evaluation for hyperbaric oxygen therapy History of  Present Illness (HPI) ADMISSION 09/10/2022 This is a 67 year old poorly controlled type II diabetic (last hemoglobin A1c 10.8%) who has had an ulcer on his heel for over 3 years. He has been seen in multiple wound care centers, including [facility] and [facility]. He reports that at least 3 doctors have recommended that he undergo below-knee amputation. He most recently met with Dr. Reuel Boom, a vascular surgeon affiliated with [facility]. 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 Unfortunately, the patient  has some extenuating social circumstances including the fact that he cares for his wife who has stage IV colon cancer and still works, driving vehicles for [employer]. 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 He was recently hospitalized at [facility] [facility] and currently has a PICC line through which he receives IV antibiotics. He reports having had another MRI during that hospital stay along with a chest x-ray and EKG. He apparently contacted one of the hyperbaric therapy techs here and asked a number of questions about hyperbaric oxygen treatments. He subsequently self-referred to our center to undergo further JAYE, POLIDORI ([id number]) [phone].pdf Page 7 of 12 evaluation and management. I mention to him that [facility] actually has hyperbaric chambers, but he states that he lives in [place] and this would be more convenient for him given the intensive nature of the therapy and time requirement. ABI in clinic today was 0.94. The patient actually has 2 wounds. There is a wound on the dorsum of his left foot with heavy black eschar and slough present. After debridement, this was demonstrated to involve the muscle and the extensor tendons are exposed. On his heel, there is essentially a "shark bite" type wound, with much of the heel fat pad  absent. The muscle layer is exposed. There is blue-green staining around the perimeter of the wound, but no significant odor. He says he has been applying collagen to the wound on his heel and Silvadene and Betadine to the wound on his dorsal foot. 09/20/2022: The heel wound is quite macerated with wet periwound callus. There is slough accumulation on the surface. The dorsal foot wound looks better this week. There is still exposed tendon, but it is fairly clean with just a little biofilm buildup. We are still working on gathering the required documentation to submit for pretreatment review for hyperbaric oxygen therapy. 09/29/2022: The dorsal foot wound continues to improve. I do not see any exposed tendon at this point. There is just some slough accumulation on the wound surface. He continues to have very wet macerated periwound callus on his heel. There is slough on the surface, but it is loose and thin. There is an area of undermining at the 11 o'clock position, but the overlying skin and subcutaneous tissue is healthy and viable. He has been approved for hyperbaric oxygen therapy and will start treatment tomorrow. 10/06/2022: Continued contraction and improvement of the dorsal foot wound. There is just a little bit of slough accumulation on the wound surface. The periwound callus continues to accumulate on the heel wound and it is quite macerated. It is also persistently found with blue-green staining present. He did initiate his hyperbaric oxygen therapy, but has had significant difficulty with decompression. He will be going to ENT to have PE tubes placed. 10/18/2022: His hyperbaric oxygen therapy is on hold while his otological issues are being addressed. He came in again today with the periwound skin on both the dorsal aspect of his foot and his calcaneus completely macerated. The blue-green discoloration, however, has abated and the undermining on the calcaneus has improved. The dorsal foot  wound has also contracted somewhat. 10/26/2022: The dorsal foot wound continues to contract and fill with good granulation tissue. The heel, once again, has a rim of macerated callus, but the undermining and tunneling continues to contract. He has completed his oral antibiotics and has been using the [facility] topical compounded antibiotic for his dressing changes at home. He is scheduled to  see ENT this afternoon. 11/08/2022: The dorsal foot wound is flush with the surrounding skin and has a good granulation tissue surface. There is some slough accumulation. As usual, the heel has a rim of macerated callus but the dimensions are smaller and the tunneling and undermining have contracted further. He has resumed his hyperbaric oxygen therapy. 12/12; patient seen for wound evaluation. He is tolerating HBO although we could not dive him yesterday because of relative hypoglycemia and the fact he had given himself NovoLog insulin before he came to clinic. Today his blood sugar is in the 180 range she should be fine. He has a large wound on the plantar calcaneus on the right and a more superficial area on the dorsal foot. He is using a scooter for offloading 11/30/2022: The dorsal foot wound continues to contract. There is some slough on the wound surface. The large calcaneus wound has heaped up wet callus around the margins. Continued undermining. 12/08/2022: The dorsal foot wound is flat and flush with the surrounding skin surface. There is some slough present. Once again, the calcaneal wound has thick, absolutely macerated callus hanging off in tatters around the edges. The patient cannot explain to me why this part of his wound gets so wet. There is slough on the surface. The undermined portion of the wound has filled in. 12/17/2022: Earlier this week, he presented for his hyperbaric oxygen therapy and was hypotensive and ill-appearing. He also had what appeared to be an infected sebaceous cyst on his  right upper arm. He was sent to the emergency department. He was given a fluid bolus and the ED provider lanced the cyst. He was prescribed doxycycline. He seems to be feeling better today. He notes that he has had copious drainage from his foot. On further questioning, he states that he has not been taking his oral diuretic. The dorsal foot wound has expanded somewhat, but is actually more superficial. The plantar heel wound is about the same. 12/27/2022: The dorsal foot wound has epithelialized further. The plantar foot wound is about the same size but has heaped up macerated callus around the perimeter. The intake nurse noted a tiny fragment of bone on his dressings and there is now an area at about the 5 o'clock position where 1 can palpate bone through a small slit in the soft tissues. The wound on his arm is contracting but he still likely has cyst wall present due to the manner in which the infected sebaceous cyst was addressed in the ER. 01/01/2023: The wound on his shoulder has contracted considerably. The dorsal foot wound has some slough on the surface but also looks to be improving. The plantar foot wound is basically unchanged. 01/10/2023: The wound on his shoulder continues to diminish in size and depth. The dorsal foot wound is improving with just a light layer of slough on the surface. The plantar foot wound appears to be contracting but still has thick wet callus around the perimeter. We did in chamber T cPO2 monitoring during his last hyperbaric oxygen therapy. The results show that he has extremely poor tissue perfusion at room air and 1 atm of pressure. He does respond extremely well to hyperbaric oxygen therapy, however. 01/17/2023: The wound on his shoulder is down to 0.6 cm in depth, down from 1 cm last week. The dorsal foot wound is quite a bit smaller this week with just some light slough on the surface. The plantar foot wound is also smaller and the area of bone  that was exposed  at the posterior heel is now covered. There is still substantial wet callus around the perimeter. He is scheduled to undergo formal vascular studies sometime next week. 01/24/2023: The wound on his shoulder has almost completely filled in, with just a little bit of depth remaining. The dorsal foot wound got macerated; it appears that his home health nurses are applying the drape for his heel wound over the top of this, meaning that the dorsal foot wound is not getting changed as regularly as it is supposed to. There is some slough on the surface. The plantar foot wound has thick wet callus around the perimeter. The dimensions measure a little bit smaller. He is having his vascular studies done this afternoon. 02/07/2023: The wound on his shoulder is healed. The dorsal foot wound continues to be subjected to excessive moisture due to the home health nurses applying the drape from his wound VAC over the top of it. It does measure smaller, however. There is more bone exposed on his calcaneus, unfortunately. He has heaped up wet callus around the edges of the heel wound again. He is going to undergo angiography with Dr. Lenell Antu on Friday. Hopefully there will be intervention available to him that may aid in his wound healing. 02/14/2023: He did not get his angiogram last week with Dr. Lenell Antu because he did not have a ride home. The dorsal foot wound looks a little bit better with just a light layer of slough. Unfortunately, there is more bone exposed at the calcaneus and I am concerned that we are nearing a point at which his leg will not be salvageable. 3/18; patient presents for follow-up. He was recently hospitalized on 3/11 for sepsis secondary to diabetic foot wound. He was dishcharged with Augmentin and doxycycline and plan is for 6 weeks of this. He currently has a wound VAC. Wound has declined in appearance. Amputation was recommended in the hospital however patient is not ready for this. Patient  states he would like to stop HBO. He currently denies systemic signs of infection. 03/07/2023: The calcaneal wound continues to deteriorate. There is significant undermining around the posterior aspect of the calcaneus and bone is palpable all the way around. The dorsal foot wound has some leathery eschar and slough present. 03/14/2023: The dorsal foot wound is somewhat leathery with some dark eschar and slough present. The calcaneal wound is deteriorating further. There are fragments of the bone hanging loose. He does have an appointment with Dr. Lenell Antu next week on Tuesday to discuss amputation. [name], [name] ([id number]) [id number] Page 8 of 12 04/04/2023: The heel ulcer is larger, but shallower. There is heaped up with callus around the edges. The dorsal foot wound is stable. He has appointment with Dr. Lenell Antu was rescheduled for next Tuesday. He is working on arrangements for his dog and his wife to facilitate below-knee amputation. 04/12/2023: The heel ulcer looks about the same. Bone is not exposed. There is moist callus heaped up around the edges. The dorsal foot wound is stable. He will be seeing Dr. Lenell Antu this afternoon to discuss amputation. Patient History Family History Cancer - Father,Mother, Diabetes - Mother,Father, Lung Disease - Father, Thyroid Problems - Mother, No family history of Heart Disease, Hereditary Spherocytosis, Hypertension, Kidney Disease, Seizures, Stroke, Tuberculosis. Social History Never smoker, Marital Status - Married, Alcohol Use - Never, Drug Use - No History, Caffeine Use - Daily. Medical History Eyes Patient has history of Cataracts Denies history of Glaucoma, Optic Neuritis Ear/Nose/Mouth/Throat Denies  history of Chronic sinus problems/congestion, Middle ear problems Respiratory Patient has history of Sleep Apnea Cardiovascular Patient has history of Hypertension Gastrointestinal Denies history of Cirrhosis , Colitis,  Crohnoos, Hepatitis A, Hepatitis B, Hepatitis C Endocrine Patient has history of Type II Diabetes Immunological Denies history of Lupus Erythematosus, Raynaudoos, Scleroderma Musculoskeletal Patient has history of Osteomyelitis - 2023 Neurologic Patient has history of Neuropathy - Bila lower extremities Oncologic Denies history of Received Chemotherapy, Received Radiation Psychiatric Denies history of Anorexia/bulimia, Confinement Anxiety Hospitalization/Surgery History - I and D Left calcaneus. - back surgery- laminectomy. - eye surgery- Bila cataracts. - shoulder arthroscopy. Medical A Surgical History Notes nd Cardiovascular hyperlipidemia Genitourinary AKI Objective Constitutional Hypertensive, asymptomatic. no acute distress. Vitals Time Taken: 10:33 AM, Height: 74 in, Weight: 245 lbs, BMI: 31.5, Temperature: 97.8 F, Pulse: 89 bpm, Respiratory Rate: 18 breaths/min, Blood Pressure: 161/82 mmHg, Capillary Blood Glucose: 103 mg/dl. Respiratory Normal work of breathing on room air. General Notes: 04/12/2023: The heel ulcer looks about the same. Bone is not exposed. There is moist callus heaped up around the edges. The dorsal foot wound is stable. Integumentary (Hair, Skin) Wound #1 status is Open. Original cause of wound was Pressure Injury. The date acquired was: 03/09/2019. The wound has been in treatment 30 weeks. The wound is located on the Left Calcaneus. The wound measures 5.7cm length x 4.6cm width x 0.7cm depth; 20.593cm^2 area and 14.415cm^3 volume. There is Fat Layer (Subcutaneous Tissue) exposed. There is no tunneling noted, however, there is undermining starting at 4:00 and ending at 7:00 with a maximum distance of 0.7cm. There is additional undermining and at 2:00 and ending at 3:00 with a maximum distance of 0.5cm. There is a large amount of serosanguineous drainage noted. The wound margin is thickened. There is large (67-100%) pink, pale granulation within the  wound bed. There is a small (1-33%) amount of necrotic tissue within the wound bed including Adherent Slough. The periwound skin appearance exhibited: Callus, Maceration, Erythema. The periwound skin appearance did not exhibit: Dry/Scaly, Rubor. The surrounding wound skin color is noted with erythema. Periwound temperature was noted as No Abnormality. Wound #2 status is Open. Original cause of wound was Pressure Injury. The date acquired was: 07/09/2022. The wound has been in treatment 30 weeks. The wound is located on the Left,Dorsal Foot. The wound measures 3.4cm length x 3cm width x 0.1cm depth; 8.011cm^2 area and 0.801cm^3 volume. There is Fat Layer (Subcutaneous Tissue) exposed. There is a medium amount of serosanguineous drainage noted. The wound margin is flat and intact. There is small (1- 33%) red, pink granulation within the wound bed. There is a large (67-100%) amount of necrotic tissue within the wound bed including Eschar and Adherent Slough. The periwound skin appearance had no abnormalities noted for texture. The periwound skin appearance had no abnormalities noted for color. The periwound skin appearance exhibited: Dry/Scaly. The periwound skin appearance did not exhibit: Maceration. Periwound temperature was noted as No Abnormality. 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 There was a Selective/Open Wound  Skin/Epidermis Debridement  with a total area of 20.58 sq cm performed by [name], MD. With the following instrument(s): Curette to remove Non-Viable tissue/material. Material removed includes [facility] and Skin: Epidermis and. A time out was conducted at 10:47, prior to the start of the procedure. A Minimum amount of bleeding was controlled with Pressure. The procedure was tolerated well. Post Debridement Measurements: 5.7cm length x 4.6cm width x 0.7cm depth; 14.415cm^3 volume. Character of Wound/Ulcer Post Debridement is improved. Severity of Tissue Post Debridement is: Fat layer exposed. Post procedure Diagnosis Wound #1: Same as Pre-Procedure General Notes: Scribed for Dr Lady Gary by Samuella Bruin, RN. 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 There was a Excisional Skin/Subcutaneous Tissue Debridement with a total area of 8.01 sq cm performed by [name], MD. With the following instrument(s): Curette to remove Non-Viable tissue/material. Material removed includes Subcutaneous Tissue and Slough and. A time out was conducted at 10:47, prior to the start of the procedure. A Minimum amount of bleeding was controlled with Pressure. The procedure was tolerated well. Post Debridement Measurements: 3.4cm length x 3cm width x 0.1cm depth; 0.801cm^3 volume. Character of Wound/Ulcer Post Debridement is improved. Severity of Tissue Post Debridement is: Fat layer exposed. Post procedure Diagnosis Wound #2: Same as Pre-Procedure General Notes: Scribed for Dr Lady Gary by Samuella Bruin, RN. Plan Follow-up Appointments: Return Appointment in 2 weeks. - Dr. Lady Gary rm 2 Bathing/ Shower/ Hygiene: May shower with protection but do not get wound dressing(s) wet. Protect dressing(s) with water repellant cover (for example, large plastic bag) or a cast cover and may then take  shower. Edema Control - Lymphedema / SCD / Other: Elevate legs to the level of the heart or above for 30 minutes daily and/or when sitting for 3-4 times a day throughout the day. Avoid standing for long periods of time. Moisturize legs daily. Compression stocking or Garment 30-40 mm/Hg pressure to: - left leg daily Off-Loading: Wound #1 Left Calcaneus: Other: - use knee scooter to ambulate, minimal weight bearing right foot WOUND #1: - Calcaneus Wound Laterality: Left Cleanser: Soap and Water 1 x Per Day/30 Days Discharge Instructions: May shower and wash wound with dial antibacterial soap and water prior to dressing change. Cleanser: Vashe 5.8 (oz) 1 x Per Day/30 Days Discharge Instructions: Cleanse the wound with Vashe prior to applying a clean dressing using gauze sponges, not tissue or cotton balls. Peri-Wound Care: Zinc Oxide Ointment 30g tube 1 x Per Day/30 Days Discharge Instructions: Apply Zinc Oxide to periwound with each dressing change Topical: keystone 1 x Per Day/30 Days Secondary Dressing: Woven Gauze Sponge, Non-Sterile 4x4 in (Generic) 1 x Per Day/30 Days Discharge Instructions: Wet with Vashe we to dry. Secondary Dressing: Zetuvit Plus 4x8 in (Dispense As Written) 1 x Per Day/30 Days Discharge Instructions: Apply over primary dressing as directed. Secured With: Coban Self-Adherent Wrap 4x5 (in/yd) (Generic) 1 x Per Day/30 Days Discharge Instructions: Secure with Coban as directed. Secured With: [employer], 4.5x3.1 (in/yd) (Generic) 1 x Per Day/30 Days Discharge Instructions: Secure with Kerlix as directed. WOUND #2: - Foot Wound Laterality: Dorsal, Left Cleanser: Soap and Water 1 x Per Day/30 Days Discharge Instructions: May shower and wash wound with dial antibacterial soap and water prior to dressing change. Cleanser: Vashe 5.8 (oz) 1 x Per Day/30 Days Discharge Instructions: Cleanse the wound with Vashe prior to applying a  clean dressing using gauze sponges,  not tissue or cotton balls. Peri-Wound Care: keystone 1 x Per Day/30 Days Prim Dressing: Hydrofera Blue Ready Transfer Foam, 2.5x2.5 (in/in) (Dispense As Written) 1 x Per Day/30 Days ary Discharge Instructions: Apply directly to wound bed as directed Prim Dressing: MediHoney Gel, tube 1.5 (oz) 1 x Per Day/30 Days ary Discharge Instructions: Apply to wound bed as instructed Secondary Dressing: Zetuvit Plus 4x8 in 1 x Per Day/30 Days [name], [name] ([id number]) [phone].pdf Page 10 of 12 Discharge Instructions: Apply over primary dressing as directed. Secured With: Coban Self-Adherent Wrap 4x5 (in/yd) 1 x Per Day/30 Days Discharge Instructions: Secure with Coban as directed. Secured With: [employer], 4.5x3.1 (in/yd) 1 x Per Day/30 Days Discharge Instructions: Secure with Kerlix as directed. Com pression Stockings: Circaid Juxta Lite Compression Wrap Compression Amount: 30-40 mmHg (left) Discharge Instructions: Apply Circaid Juxta Lite Compression Wrap daily as instructed. Apply first thing in the morning, remove at night before bed. 04/12/2023: The heel ulcer looks about the same. Bone is not exposed. There is moist callus heaped up around the edges. The dorsal foot wound is stable. I used a curette to debride slough and subcutaneous tissue from the dorsal foot. Will continue Medihoney and Hydrofera Blue to this wound. I debrided callus, skin, and slough from the heel. We will continue Vashe-moistened gauze dressing changes to this site. Kerlix and Coban wrap. He is working on finding care for his wife, as well as seeking counseling for himself regarding his impending need for amputation. Follow-up in 2 weeks. Electronic Signature(s) Signed: 04/12/2023 12:04:09 PM By: [name] MD FACS Entered By: [name] on 04/12/2023 12:04:09 -------------------------------------------------------------------------------- HxROS Details Patient Name: Date  of Service: [name], [name] 04/12/2023 10:15 A M Medical Record Number: [id number] Patient Account Number: [id number] Date of Birth/Sex: Treating RN: 1956-01-03 (67 y.o. M) Primary Care Provider: Foye [name] Other Clinician: Referring Provider: Treating Provider/Extender: Kandis Cocking in Treatment: 30 Eyes Medical History: Positive for: Cataracts Negative for: Glaucoma; Optic Neuritis Ear/Nose/Mouth/Throat Medical History: Negative for: Chronic sinus problems/congestion; Middle ear problems Respiratory Medical History: Positive for: Sleep Apnea Cardiovascular Medical History: Positive for: Hypertension Past Medical History Notes: hyperlipidemia Gastrointestinal Medical History: Negative for: Cirrhosis ; Colitis; Crohns; Hepatitis A; Hepatitis B; Hepatitis C Endocrine Medical History: Positive for: Type II Diabetes Time with diabetes: 20 years Treated with: Insulin Blood sugar tested every day: Yes Tested : 2-3 Genitourinary Medical History: Past Medical History Notes: AKI IKTAN, AIKMAN ([id number]) [phone].pdf Page 11 of 12 Immunological Medical History: Negative for: Lupus Erythematosus; Raynauds; Scleroderma Musculoskeletal Medical History: Positive for: Osteomyelitis - 2023 Neurologic Medical History: Positive for: Neuropathy - Bila lower extremities Oncologic Medical History: Negative for: Received Chemotherapy; Received Radiation Psychiatric Medical History: Negative for: Anorexia/bulimia; Confinement Anxiety HBO Extended History Items Eyes: Cataracts Immunizations Pneumococcal Vaccine: Received Pneumococcal Vaccination: Yes Received Pneumococcal Vaccination On or After 60th Birthday: Yes Implantable Devices Yes Hospitalization / Surgery History Type of Hospitalization/Surgery I and D Left calcaneus back surgery- laminectomy eye surgery- Bila cataracts shoulder arthroscopy Family  and Social History Cancer: Yes - Father,Mother; Diabetes: Yes - Mother,Father; Heart Disease: No; Hereditary Spherocytosis: No; Hypertension: No; Kidney Disease: No; Lung Disease: Yes - Father; Seizures: No; Stroke: No; Thyroid Problems: Yes - Mother; Tuberculosis: No; Never smoker; Marital Status - Married; Alcohol Use: Never; Drug Use: No History; Caffeine Use: Daily; Financial Concerns: No; Food, Clothing or Shelter Needs: No; Support System Lacking: No; Transportation Concerns: No Electronic Signature(s)  Signed: 04/12/2023 3:54:11 PM By: [name] MD FACS Entered By: [name] on 04/12/2023 12:01:15 -------------------------------------------------------------------------------- SuperBill Details Patient Name: Date of Service: [place], [name] 04/12/2023 Medical Record Number: [id number] Patient Account Number: [id number] Date of Birth/Sex: Treating RN: 1956-09-22 (67 y.o. 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 L97.523 Non-pressure chronic ulcer of other part of left foot with necrosis of 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, left ankle and foot AZARIAN, STARACE ([id number]) [phone].pdf Page 12 of 12 E11.65 Type 2 diabetes mellitus with hyperglycemia E11.621 Type 2 diabetes mellitus with foot ulcer Facility Procedures : The patient participates with Medicare or their insurance follows the Medicare Facility Guidelines: CPT4 Code Description Modifier Quantity [id number] 11042 - DEB SUBQ TISSUE 20 SQ CM/< 1 ICD-10 Diagnosis Description L97.523 Non-pressure chronic ulcer of  other part of left foot with necrosis of muscle : The patient participates with Medicare or their insurance follows  the Medicare Facility Guidelines: [id number] - DEBRIDE WOUND 1ST 20 SQ CM OR < 1 ICD-10 Diagnosis Description 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 : The patient participates with Medicare or their insurance follows the Medicare Facility Guidelines: [id number] - DEBRIDE WOUND EA ADDL 20 SQ CM 1 ICD-10 Diagnosis Description 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 Physician Procedures : CPT4 Code Description Modifier [id number] - WC PHYS LEVEL 3 - EST PT 25 ICD-10 Diagnosis Description 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, 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 Quantity: 1 : [id number] 11042 - WC PHYS SUBQ TISS 20 SQ CM ICD-10 Diagnosis Description L97.523 Non-pressure chronic ulcer of other part of left foot with necrosis of muscle Quantity: 1 : [id number] 97597 - WC PHYS DEBR WO ANESTH 20 SQ CM ICD-10 Diagnosis Description 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

## 2023-04-14 NOTE — Patient Instructions (Signed)
Visit Information  Thank you for taking time to visit with me today. Please don't hesitate to contact me if I can be of assistance to you.   Following are the goals we discussed today:   Goals Addressed             This Visit's Progress    Planning for care needs       Activities and task to complete in order to accomplish goals.   Call your insurance provider for more information about your Enhanced Benefits  Check voicemail and return call to [facility] to schedule individual counseling-  Expect an email for the TEAMS link Amputee Support Group at [facility]  and plan to attend May's meeting virtually or in person  Start / continue healthy coping activities including relaxed breathing, mindfulness techniques, etc (will email some info to you)  Keep all upcoming appointment discussed today Continue with compliance of taking medication prescribed by Doctor Glad you enjoyed your first meet up/lunch with your 2 peer support men-  Make list of questions/concerns, etc to take with you to your next meet up wit them   Await call from [facility]- for hopeful help with providing assistance with light housekeeping, laundry, errands and some personal care  Consider private pay home care options if needed/desired to help with wife's care needs while you are hospitalized and rehabilitating Review insurance policy to see if you have Long-term care insurance or a custodial care benefit Complete Advance Directive packet,  Have advance directive notarized and provide a copy to provider office  RNCM to contact you also for assessment and support with medical needs         Our next appointment is by telephone on 04/21/23  Please call the care guide team at [phone] if you need to cancel or reschedule your appointment.   If you are experiencing a Mental Health or Behavioral Health Crisis or need someone to talk to, please call the Suicide and  Crisis Lifeline: 988 call 911   The patient verbalized understanding of instructions, educational materials, and care plan provided today and DECLINED offer to receive copy of patient instructions, educational materials, and care plan.   Telephone follow up appointment with care management team member scheduled for:04/21/23  Reece Levy, MSW, LCSW Clinical Social Worker [facility] [phone]

## 2023-04-14 NOTE — Patient Outreach (Signed)
  Care Coordination   Follow Up Visit Note   04/14/2023 Name: [name] MRN: [id number] DOB: [birth year]  [name] is a 67 y.o. year old male who sees [name] Fusi, MD for primary care. I spoke with  [name] by phone today.  What matters to the patients health and wellness today? " Met with my peer support friends/men with the amputee support group- very insightful"    Goals Addressed             This Visit's Progress    Planning for care needs       Activities and task to complete in order to accomplish goals.   Call your insurance provider for more information about your Enhanced Benefits  Check voicemail and return call to [facility] to schedule individual counseling-  Expect an email for the TEAMS link Amputee Support Group at [facility]  and plan to attend May's meeting virtually or in person  Start / continue healthy coping activities including relaxed breathing, mindfulness techniques, etc (will email some info to you)  Keep all upcoming appointment discussed today Continue with compliance of taking medication prescribed by Doctor Glad you enjoyed your first meet up/lunch with your 2 peer support men-  Make list of questions/concerns, etc to take with you to your next meet up wit them   Await call from [facility]- for hopeful help with providing assistance with light housekeeping, laundry, errands and some personal care  Consider private pay home care options if needed/desired to help with wife's care needs while you are hospitalized and rehabilitating Review insurance policy to see if you have Long-term care insurance or a custodial care benefit Complete Advance Directive packet,  Have advance directive notarized and provide a copy to provider office  RNCM to contact you also for assessment and support with medical needs         SDOH assessments and interventions completed:  Yes     Care  Coordination Interventions:  Yes, provided  Interventions Today    Flowsheet Row Most Recent Value  Chronic Disease   Chronic disease during today's visit Diabetes  Mental Health Interventions   Mental Health Discussed/Reviewed Coping Strategies, Mental Health Discussed       Follow up plan: Follow up call scheduled for 04/21/23    Encounter Outcome:  Pt. Visit Completed

## 2023-04-21 ENCOUNTER — Ambulatory Visit: Payer: Self-pay | Admitting: *Deleted

## 2023-04-22 ENCOUNTER — Encounter: Payer: Medicare Other | Admitting: *Deleted

## 2023-04-22 NOTE — Patient Outreach (Signed)
  Care Coordination   Follow Up Visit Note   04/22/2023 Name: [name] MRN: [id number] DOB: [birth year]  [name] is a 67 y.o. year old male who sees [name] Fusi, [name] for primary care. I spoke with  [name] by phone today.  What matters to the patients health and wellness today?  Had to re-schedule counseling initial session for next week.    Goals Addressed             This Visit's Progress    Planning for care needs       Activities and task to complete in order to accomplish goals.   Call your insurance provider for more information about your Enhanced Benefits  [facility] appointment scheduled 04/21/23 individual counseling-  Expect an email for the TEAMS link Amputee Support Group at [facility]  and plan to attend May's meeting virtually or in person  Start / continue healthy coping activities including relaxed breathing, mindfulness techniques, etc (will email some info to you)  Keep all upcoming appointment discussed today Continue with compliance of taking medication prescribed by Doctor Glad you enjoyed your first meet up/lunch with your 2 peer support men-  Make list of questions/concerns, etc to take with you to your next meet up wit them   Await call from [facility]- for hopeful help with providing assistance with light housekeeping, laundry, errands and some personal care  Consider private pay home care options if needed/desired to help with wife's care needs while you are hospitalized and rehabilitating Review insurance policy to see if you have Long-term care insurance or a custodial care benefit Complete Advance Directive packet,  Have advance directive notarized and provide a copy to provider office  RNCM to contact you also for assessment and support with medical needs         SDOH assessments and interventions completed:  Yes     Care Coordination Interventions:  Yes, provided    Interventions Today    Flowsheet Row Most Recent Value  Chronic Disease   Chronic disease during today's visit Diabetes  General Interventions   General Interventions Discussed/Reviewed Community Resources  [Pt was unable to get the link to work for the virtual support group and will try for next month]  Mental Health Interventions   Mental Health Discussed/Reviewed Mental Health Discussed, Coping Strategies  [Pt shared more yelling by wife,  "talks to me like I'm a dog". Encouraged pt to remove himself from these moments wtih her when he can]        Follow up plan: Follow up call scheduled for 04/29/23    Encounter Outcome:  Pt. Visit Completed

## 2023-04-22 NOTE — Patient Instructions (Signed)
Visit Information  Thank you for taking time to visit with me today. Please don't hesitate to contact me if I can be of assistance to you.   Following are the goals we discussed today:   Goals Addressed             This Visit's Progress    Planning for care needs       Activities and task to complete in order to accomplish goals.   Call your insurance provider for more information about your Enhanced Benefits  [facility] appointment re-scheduled 04/28/23 individual counseling-  Plan to attend 05/26/23 Amputee Support Group either in-person or online   Start / continue healthy coping activities including relaxed breathing, mindfulness techniques, etc (will email some info to you)  Keep all upcoming appointment discussed today Continue with compliance of taking medication prescribed by Doctor Glad you enjoyed your first meet up/lunch with your 2 peer support men-  Make list of questions/concerns, etc to take with you to your next meet up wit them   Await call from [facility]- for hopeful help with providing assistance with light housekeeping, laundry, errands and some personal care  Consider private pay home care options if needed/desired to help with wife's care needs while you are hospitalized and rehabilitating Review insurance policy to see if you have Long-term care insurance or a custodial care benefit Complete Advance Directive packet,  Have advance directive notarized and provide a copy to provider office  RNCM to contact you also for assessment and support with medical needs         Our next appointment is by telephone on 04/29/23   Please call the care guide team at [phone] if you need to cancel or reschedule your appointment.   If you are experiencing a Mental Health or Behavioral Health Crisis or need someone to talk to, please call the Suicide and Crisis Lifeline: 988 call 911   The patient verbalized  understanding of instructions, educational materials, and care plan provided today and DECLINED offer to receive copy of patient instructions, educational materials, and care plan.   Telephone follow up appointment with care management team member scheduled for: 04/29/23  Reece Levy, MSW, LCSW Clinical Social Worker [facility] [phone]

## 2023-04-25 ENCOUNTER — Other Ambulatory Visit (HOSPITAL_COMMUNITY): Payer: Self-pay

## 2023-04-26 ENCOUNTER — Ambulatory Visit (HOSPITAL_BASED_OUTPATIENT_CLINIC_OR_DEPARTMENT_OTHER): Payer: Medicare Other | Admitting: General Surgery

## 2023-04-27 ENCOUNTER — Encounter (HOSPITAL_BASED_OUTPATIENT_CLINIC_OR_DEPARTMENT_OTHER): Payer: Medicare Other | Attending: General Surgery | Admitting: General Surgery

## 2023-04-27 ENCOUNTER — Telehealth: Payer: Self-pay

## 2023-04-27 DIAGNOSIS — L97424 Non-pressure chronic ulcer of left heel and midfoot with necrosis of bone: Secondary | ICD-10-CM | POA: Insufficient documentation

## 2023-04-27 DIAGNOSIS — E11621 Type 2 diabetes mellitus with foot ulcer: Secondary | ICD-10-CM | POA: Insufficient documentation

## 2023-04-27 DIAGNOSIS — M86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, left ankle and foot: Secondary | ICD-10-CM | POA: Insufficient documentation

## 2023-04-27 DIAGNOSIS — E1165 Type 2 diabetes mellitus with hyperglycemia: Secondary | ICD-10-CM | POA: Diagnosis not present

## 2023-04-27 DIAGNOSIS — I10 Essential (primary) hypertension: Secondary | ICD-10-CM | POA: Insufficient documentation

## 2023-04-27 DIAGNOSIS — L97523 Non-pressure chronic ulcer of other part of left foot with necrosis of muscle: Secondary | ICD-10-CM | POA: Insufficient documentation

## 2023-04-27 NOTE — Telephone Encounter (Signed)
Attempted to return pt's call regarding his request for additional antibiotics to be prescribed. I left him a voicemail to call us back if he still needs assistance.

## 2023-04-28 ENCOUNTER — Telehealth: Payer: Self-pay

## 2023-04-28 NOTE — Telephone Encounter (Signed)
-----   Message from Leonie Douglas, MD sent at 04/26/2023  8:08 AM EDT ----- Regarding: RE: Antibiotics? He needs to see his PCP or go to ER. I am not his antibiotic provider. Thanks. Tom  ----- Message ----- From: Hurshel Keys, RN Sent: 04/25/2023  10:25 AM EDT To: Leonie Douglas, MD Subject: Antibiotics?                                   Dr. Lenell Antu,  This pt was d/c'd with 6 wks of antibiotics. He has 2 pills of each left. A referral was sent to ID, but the earliest he can get in is on 5/22. The pt doesn't want to be out of the antibiotic, get worse, and be told next week that he has to go to the ED. What is your advice? Are you willing to prescribe a refill to tide him over until he's seen?  Thanks, Lawanna Kobus, RN - Triage

## 2023-04-29 ENCOUNTER — Encounter: Payer: Self-pay | Admitting: *Deleted

## 2023-05-02 ENCOUNTER — Ambulatory Visit: Payer: Self-pay | Admitting: *Deleted

## 2023-05-02 NOTE — Patient Outreach (Signed)
  Care Coordination   Follow Up Visit Note   05/02/2023 Name: [name] MRN: [id number] DOB: 11-03-1956  [name] is a 67 y.o. year old male who sees [name], MD for primary care. I spoke with  [name] by phone today.  What matters to the patients health and wellness today?  Foot is looking better; "filling in".    Goals Addressed             This Visit's Progress    Planning for care needs       Activities and task to complete in order to accomplish goals.   Call your insurance provider for more information about your Enhanced Benefits  [facility] appointment re-scheduled for 5/122/24 individual counseling-  Plan to attend 05/26/23 Amputee Support Group either in-person or online   Start / continue healthy coping activities including relaxed breathing, mindfulness techniques, etc (will email some info to you)  Keep all upcoming appointment discussed today Continue with compliance of taking medication prescribed by Doctor Glad you enjoyed your first meet up/lunch with your 2 peer support men-  Make list of questions/concerns, etc to take with you to your next meet up wit them   Await call from [facility]- for hopeful help with providing assistance with light housekeeping, laundry, errands and some personal care  Consider private pay home care options if needed/desired to help with wife's care needs while you are hospitalized and rehabilitating Review insurance policy to see if you have Long-term care insurance or a custodial care benefit Complete Advance Directive packet,  Have advance directive notarized and provide a copy to provider office  RNCM to contact you also for assessment and support with medical needs         SDOH assessments and interventions completed:  Yes     Care Coordination Interventions:  Yes, provided  Interventions Today    Flowsheet Row Most Recent Value  Chronic  Disease   Chronic disease during today's visit Diabetes, Other  [pt reports further healing of his foot wound, however, anticipates amputation is realistic in future. Wound center is now every 2 weeks per pt.]  General Interventions   General Interventions Discussed/Reviewed Community Resources  [facility] wait list for in-home support]  Mental Health Interventions   Mental Health Discussed/Reviewed Mental Health Discussed, Coping Strategies  [Pt shared recent updates regarding home environment, chores,etc. Plans to be seen at [facility] for initial counseling session on 05/04/23]       Follow up plan:  5 /30/24   Encounter Outcome:  Pt. Visit Completed

## 2023-05-04 ENCOUNTER — Ambulatory Visit: Payer: Medicare Other | Admitting: Internal Medicine

## 2023-05-11 ENCOUNTER — Encounter (HOSPITAL_BASED_OUTPATIENT_CLINIC_OR_DEPARTMENT_OTHER): Payer: Medicare Other | Admitting: General Surgery

## 2023-05-12 ENCOUNTER — Ambulatory Visit: Payer: Medicare Other | Admitting: Internal Medicine

## 2023-05-12 ENCOUNTER — Other Ambulatory Visit: Payer: Self-pay

## 2023-05-12 ENCOUNTER — Telehealth: Payer: Self-pay

## 2023-05-12 ENCOUNTER — Ambulatory Visit: Payer: Self-pay | Admitting: *Deleted

## 2023-05-12 NOTE — Patient Instructions (Signed)
Visit Information  Thank you for taking time to visit with me today. Please don't hesitate to contact me if I can be of assistance to you.   Following are the goals we discussed today:   Goals Addressed             This Visit's Progress    Planning for care needs       Activities and task to complete in order to accomplish goals.   Call your insurance provider for more information about your Enhanced Benefits  Will try to find another Provider for counseling services and will call you once found  Plan to attend 05/26/23 Amputee Support Group either in-person or online   Start / continue healthy coping activities including relaxed breathing, mindfulness techniques, etc (will email some info to you)  Keep all upcoming appointments Continue with compliance of taking medication prescribed by Doctor Glad you enjoyed your first meet up/lunch with your 2 peer support men-  Make list of questions/concerns, etc to take with you to your next meet up wit them   Await call from [facility]- for hopeful help with providing assistance with light housekeeping, laundry, errands and some personal care  Consider private pay home care options if needed/desired to help with wife's care needs while you are hospitalized and rehabilitating Review insurance policy to see if you have Long-term care insurance or a custodial care benefit Complete Advance Directive packet,  Have advance directive notarized and provide a copy to provider office  RNCM to contact you also for assessment and support with medical needs         Our next appointment is by telephone is pending for once Counselor is secured  Please call the care guide team at [phone] if you need to cancel or reschedule your appointment.   If you are experiencing a Mental Health or Behavioral Health Crisis or need someone to talk to, please call the Suicide and Crisis Lifeline: 988 call 911   The patient verbalized  understanding of instructions, educational materials, and care plan provided today and DECLINED offer to receive copy of patient instructions, educational materials, and care plan.   Telephone follow up appointment with care management team member scheduled for: 1-7 days  Reece Levy, MSW, LCSW Clinical Social Worker [facility] [phone]

## 2023-05-12 NOTE — Patient Outreach (Signed)
  Care Coordination   Follow Up Visit Note   05/12/2023 Name: [name] MRN: [id number] DOB: Dec 05, 1956  [name] is a 67 y.o. year old male who sees [name], MD for primary care. I spoke with  [name] by phone today.  What matters to the patients health and wellness today?  Had to cancel/reschedule Wound Center, ID and counseling appointments.    Goals Addressed             This Visit's Progress    Planning for care needs       Activities and task to complete in order to accomplish goals.   Call your insurance provider for more information about your Enhanced Benefits  Will try to find another Provider for counseling services and will call you once found  Plan to attend 05/26/23 Amputee Support Group either in-person or online   Start / continue healthy coping activities including relaxed breathing, mindfulness techniques, etc (will email some info to you)  Keep all upcoming appointments Continue with compliance of taking medication prescribed by Doctor Glad you enjoyed your first meet up/lunch with your 2 peer support men-  Make list of questions/concerns, etc to take with you to your next meet up wit them   Await call from [facility]- for hopeful help with providing assistance with light housekeeping, laundry, errands and some personal care  Consider private pay home care options if needed/desired to help with wife's care needs while you are hospitalized and rehabilitating Review insurance policy to see if you have Long-term care insurance or a custodial care benefit Complete Advance Directive packet,  Have advance directive notarized and provide a copy to provider office  RNCM to contact you also for assessment and support with medical needs         SDOH assessments and interventions completed:  Yes     Care Coordination Interventions:  Yes, provided  Interventions Today    Flowsheet Row Most Recent Value   Chronic Disease   Chronic disease during today's visit Diabetes  General Interventions   General Interventions Discussed/Reviewed Doctor Visits  [Pt reports having to reschedule several MD visits recently- pt plans to try to arrange visits for the same day in [place] to save time/gas]  Mental Health Interventions   Mental Health Discussed/Reviewed Mental Health Discussed, Coping Strategies, Depression, Other, Refer to Social Work for counseling  [Pt was unable to be seen at [facility] due time constraints and requests to be referred elsewhere- CSW is seeking in-netwokr provider and will contact pt once found.]       Follow up plan: Follow up call scheduled for 1-7  days (once find a provider for counseling, will call)    Encounter Outcome:  Pt. Visit Completed

## 2023-05-12 NOTE — Telephone Encounter (Signed)
Attempted to call patient regarding appointment for today. Left voicemail to confirm if he is on the way to office or if we need to reschedule. Juanita Laster, RMA

## 2023-05-16 ENCOUNTER — Encounter (HOSPITAL_BASED_OUTPATIENT_CLINIC_OR_DEPARTMENT_OTHER): Payer: Medicare Other | Attending: General Surgery | Admitting: General Surgery

## 2023-05-16 DIAGNOSIS — M86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, left ankle and foot: Secondary | ICD-10-CM | POA: Insufficient documentation

## 2023-05-16 DIAGNOSIS — L97423 Non-pressure chronic ulcer of left heel and midfoot with necrosis of muscle: Secondary | ICD-10-CM | POA: Diagnosis not present

## 2023-05-16 DIAGNOSIS — L97524 Non-pressure chronic ulcer of other part of left foot with necrosis of bone: Secondary | ICD-10-CM | POA: Insufficient documentation

## 2023-05-16 DIAGNOSIS — Z833 Family history of diabetes mellitus: Secondary | ICD-10-CM | POA: Diagnosis not present

## 2023-05-16 DIAGNOSIS — G4730 Sleep apnea, unspecified: Secondary | ICD-10-CM | POA: Insufficient documentation

## 2023-05-16 DIAGNOSIS — E785 Hyperlipidemia, unspecified: Secondary | ICD-10-CM | POA: Diagnosis not present

## 2023-05-16 DIAGNOSIS — E1140 Type 2 diabetes mellitus with diabetic neuropathy, unspecified: Secondary | ICD-10-CM | POA: Diagnosis not present

## 2023-05-16 DIAGNOSIS — I10 Essential (primary) hypertension: Secondary | ICD-10-CM | POA: Insufficient documentation

## 2023-05-16 DIAGNOSIS — E11621 Type 2 diabetes mellitus with foot ulcer: Secondary | ICD-10-CM | POA: Insufficient documentation

## 2023-05-18 ENCOUNTER — Ambulatory Visit: Payer: Self-pay | Admitting: *Deleted

## 2023-05-18 NOTE — Patient Instructions (Signed)
Visit Information  Thank you for taking time to visit with me today. Please don't hesitate to contact me if I can be of assistance to you.   Following are the goals we discussed today:   Goals Addressed             This Visit's Progress    Planning for care needs       Activities and task to complete in order to accomplish goals.   Call the mental health provider I emailed and told you about so you can discuss your needs and get scheduled Call your insurance provider for more information about your Enhanced Benefits  Will try to find another Provider for counseling services and will call you once found  Plan to attend 05/26/23 Amputee Support Group either in-person or online   Start / continue healthy coping activities including relaxed breathing, mindfulness techniques, etc (will email some info to you)  Keep all upcoming appointments Continue with compliance of taking medication prescribed by Doctor Glad you enjoyed your first meet up/lunch with your 2 peer support men-  Make list of questions/concerns, etc to take with you to your next meet up wit them   Await call from [facility]- for hopeful help with providing assistance with light housekeeping, laundry, errands and some personal care  Consider private pay home care options if needed/desired to help with wife's care needs while you are hospitalized and rehabilitating Review insurance policy to see if you have Long-term care insurance or a custodial care benefit Complete Advance Directive packet,  Have advance directive notarized and provide a copy to provider office  RNCM to contact you also for assessment and support with medical needs         Our next appointment is by telephone on 05/24/23  Please call the care guide team at [phone] if you need to cancel or reschedule your appointment.   If you are experiencing a Mental Health or Behavioral Health Crisis or need someone to talk to, please call  the Suicide and Crisis Lifeline: 988 call 911   The patient verbalized understanding of instructions, educational materials, and care plan provided today and DECLINED offer to receive copy of patient instructions, educational materials, and care plan.   Telephone follow up appointment with care management team member scheduled for:05/24/23 Follow up with provider for counseling (emailed you his BIO and contact info)   Reece Levy, MSW, LCSW Clinical Social Worker [facility] [employer] [facility] [phone]

## 2023-05-18 NOTE — Patient Outreach (Signed)
  Care Coordination   Follow Up Visit Note   05/18/2023 Name: [name] MRN: [id number] DOB: November 26, 1956  [name] is a 67 y.o. year old male who sees [name], MD for primary care. I spoke with  [name] by phone today.  What matters to the patients health and wellness today?  Been sick and in bed all day/throwing up.     Goals Addressed             This Visit's Progress    Planning for care needs       Activities and task to complete in order to accomplish goals.   Call the mental health provider I emailed and told you about so you can discuss your needs and get scheduled Call your insurance provider for more information about your Enhanced Benefits  Will try to find another Provider for counseling services and will call you once found  Plan to attend 05/26/23 Amputee Support Group either in-person or online   Start / continue healthy coping activities including relaxed breathing, mindfulness techniques, etc (will email some info to you)  Keep all upcoming appointments Continue with compliance of taking medication prescribed by Doctor Glad you enjoyed your first meet up/lunch with your 2 peer support men-  Make list of questions/concerns, etc to take with you to your next meet up wit them   Await call from [facility]- for hopeful help with providing assistance with light housekeeping, laundry, errands and some personal care  Consider private pay home care options if needed/desired to help with wife's care needs while you are hospitalized and rehabilitating Review insurance policy to see if you have Long-term care insurance or a custodial care benefit Complete Advance Directive packet,  Have advance directive notarized and provide a copy to provider office  RNCM to contact you also for assessment and support with medical needs         SDOH assessments and interventions completed:  Yes     Care Coordination Interventions:   Yes, provided  Interventions Today    Flowsheet Row Most Recent Value  Chronic Disease   Chronic disease during today's visit Diabetes, Other  [depression]  General Interventions   General Interventions Discussed/Reviewed [employer] spoke with Dr Earnie Larsson, Psychologist, who asks that pt call him directly to discuss the needs prior to scheduling session. pt provided via email with contact info and will call]  Mental Health Interventions   Mental Health Discussed/Reviewed Mental Health Discussed, Coping Strategies, Depression       Follow up plan: Follow up call scheduled for 05/24/23    Encounter Outcome:  Pt. Visit Completed

## 2023-05-24 ENCOUNTER — Ambulatory Visit (INDEPENDENT_AMBULATORY_CARE_PROVIDER_SITE_OTHER): Payer: Medicare Other | Admitting: Infectious Diseases

## 2023-05-24 ENCOUNTER — Other Ambulatory Visit: Payer: Self-pay

## 2023-05-24 ENCOUNTER — Ambulatory Visit: Payer: Self-pay | Admitting: *Deleted

## 2023-05-24 ENCOUNTER — Encounter: Payer: Self-pay | Admitting: Infectious Diseases

## 2023-05-24 ENCOUNTER — Ambulatory Visit: Payer: Medicare Other | Admitting: Infectious Diseases

## 2023-05-24 VITALS — BP 130/68 | HR 73 | Temp 97.7°F | Wt 250.0 lb

## 2023-05-24 DIAGNOSIS — E11621 Type 2 diabetes mellitus with foot ulcer: Secondary | ICD-10-CM

## 2023-05-24 DIAGNOSIS — L97426 Non-pressure chronic ulcer of left heel and midfoot with bone involvement without evidence of necrosis: Secondary | ICD-10-CM | POA: Diagnosis not present

## 2023-05-24 DIAGNOSIS — Z79899 Other long term (current) drug therapy: Secondary | ICD-10-CM

## 2023-05-24 DIAGNOSIS — M8660 Other chronic osteomyelitis, unspecified site: Secondary | ICD-10-CM

## 2023-05-24 NOTE — Patient Outreach (Signed)
  Care Coordination   Follow Up Visit Note   05/24/2023 Name: [name] MRN: [id number] DOB: [birth year]  [name] is a 67 y.o. year old male who sees [name], MD for primary care. I spoke with  [name] by phone today.  What matters to the patients health and wellness today?  Planning to schedule initial session with Counselor.     Goals Addressed             This Visit's Progress    Planning for care needs       Activities and task to complete in order to accomplish goals.   Glad you had an introduction call with the mental health provide- call back to get scheduled Call your insurance provider for more information about your Enhanced Benefits  Plan to attend 05/26/23 Amputee Support Group either in-person or online   Start / continue healthy coping activities including relaxed breathing, mindfulness techniques, etc (will email some info to you)  Keep all upcoming appointments Continue with compliance of taking medication prescribed by Doctor Glad you enjoyed your first meet up/lunch with your 2 peer support men-  Make list of questions/concerns, etc to take with you to your next meet up wit them   Await call from [facility]- for hopeful help with providing assistance with light housekeeping, laundry, errands and some personal care  Consider private pay home care options if needed/desired to help with wife's care needs while you are hospitalized and rehabilitating Review insurance policy to see if you have Long-term care insurance or a custodial care benefit Complete Advance Directive packet,  Have advance directive notarized and provide a copy to provider office  RNCM to contact you also for assessment and support with medical needs         SDOH assessments and interventions completed:  Yes     Care Coordination Interventions:  Yes, provided  Interventions Today    Flowsheet Row Most Recent Value  Chronic Disease    Chronic disease during today's visit Other  [depression]  Mental Health Interventions   Mental Health Discussed/Reviewed Mental Health Discussed, Coping Strategies, Depression, Other  [Pt was able to have a phone call with potential Counselor- which pt indicates went well. Pt plans to schedule intiial session now- encouraged pt to call soon]       Follow up plan: Follow up call scheduled for 05/27/23    Encounter Outcome:  Pt. Visit Completed

## 2023-05-24 NOTE — Patient Instructions (Signed)
Visit Information  Thank you for taking time to visit with me today. Please don't hesitate to contact me if I can be of assistance to you.   Following are the goals we discussed today:   Goals Addressed             This Visit's Progress    Planning for care needs       Activities and task to complete in order to accomplish goals.   Glad you had an introduction call with the mental health provide- call back to get scheduled Call your insurance provider for more information about your Enhanced Benefits  Plan to attend 05/26/23 Amputee Support Group either in-person or online   Start / continue healthy coping activities including relaxed breathing, mindfulness techniques, etc (will email some info to you)  Keep all upcoming appointments Continue with compliance of taking medication prescribed by Doctor Glad you enjoyed your first meet up/lunch with your 2 peer support men-  Make list of questions/concerns, etc to take with you to your next meet up wit them   Await call from [facility]- for hopeful help with providing assistance with light housekeeping, laundry, errands and some personal care  Consider private pay home care options if needed/desired to help with wife's care needs while you are hospitalized and rehabilitating Review insurance policy to see if you have Long-term care insurance or a custodial care benefit Complete Advance Directive packet,  Have advance directive notarized and provide a copy to provider office  RNCM to contact you also for assessment and support with medical needs         Our next appointment is by telephone on 05/27/23    Please call the care guide team at [phone] if you need to cancel or reschedule your appointment.   If you are experiencing a Mental Health or Behavioral Health Crisis or need someone to talk to, please call the Suicide and Crisis Lifeline: 988 call 911   The patient verbalized understanding of  instructions, educational materials, and care plan provided today and DECLINED offer to receive copy of patient instructions, educational materials, and care plan.   Telephone follow up appointment with care management team member scheduled for:05/27/23  Reece Levy, MSW, LCSW Clinical Social Worker [facility] [employer] [phone]

## 2023-05-24 NOTE — Progress Notes (Signed)
[name], [name] ([id number]) [id number] Page 1 of 7 Visit Report for 04/27/2023 Arrival Information Details Patient Name: Date of Service: [name], [name]. 04/27/2023 8:45 A M Medical Record Number: [id number] Patient Account Number: [id number] Date of Birth/Sex: Treating RN: 1956/02/05 (67 y.o. [name]: Foye [name] Other Clinician: Referring Kizer Nobbe: Treating Daijah Scrivens/Extender: Kandis Cocking in Treatment: 32 Visit Information History Since Last Visit All ordered tests and consults were completed: Yes Patient Arrived: Ambulatory Added or deleted any medications: No Arrival Time: 08:59 Any new allergies or adverse reactions: No Accompanied By: self Had a fall or experienced change in No Transfer Assistance: None activities of daily living that may affect Patient Identification Verified: Yes risk of falls: Secondary Verification Process Completed: Yes Signs or symptoms of abuse/neglect since last visito No Patient Requires Transmission-Based Precautions: No Hospitalized since last visit: No Patient Has Alerts: No Implantable device outside of the clinic excluding No cellular tissue based products placed in the center since last visit: Has Dressing in Place as Prescribed: Yes Pain Present Now: No Electronic Signature(s) Signed: 05/24/2023 7:49:05 AM By: [name] Entered By: [name] on 04/27/2023 09:03:24 -------------------------------------------------------------------------------- Encounter Discharge Information Details Patient Name: Date of Service: [name]. 04/27/2023 8:45 A M Medical Record Number: [id number] Patient Account Number: [id number] Date of Birth/Sex: Treating RN: 09-02-1956 (67 y.o. [name] Primary Care Charle Clear: Foye [name] Other Clinician: Referring Devony Mcgrady: Treating [name]/Extender: Kandis Cocking in  Treatment: 32 Encounter Discharge Information Items Post Procedure Vitals Discharge Condition: Stable Temperature (F): 98.4 Ambulatory Status: Ambulatory Pulse (bpm): 78 Discharge Destination: Home Respiratory Rate (breaths/min): 18 Transportation: Private Auto Blood Pressure (mmHg): 144/77 Accompanied By: self Schedule Follow-up Appointment: Yes Clinical Summary of Care: Patient Declined Electronic Signature(s) Signed: 05/24/2023 7:49:05 AM By: [name] Entered By: [name] on 04/27/2023 10:02:45 -------------------------------------------------------------------------------- Lower Extremity Assessment Details Patient Name: Date of Service: [name], [name] 04/27/2023 8:45 A M Medical Record Number: [id number] Patient Account Number: [id number] Date of Birth/Sex: Treating RN: 09/16/1956 (67 y.o. [name] Primary Care Oluwademilade Kellett: Foye [name] Other Clinician: Referring [name]: Treating [name]/Extender: Kayleen Memos Weeks in Treatment: 32 Edema Assessment S[Left: [name], [name] [phone] [name]: [id number] Page 2 of 7] Assessed: [Left: No] [Right: No] Edema: [Left: Ye] [Right: s] Calf Left: Right: Point of Measurement: From Medial Instep 44 cm Ankle Left: Right: Point of Measurement: From Medial Instep 26.3 cm Vascular Assessment Pulses: Dorsalis Pedis Palpable: [Left:Yes] Electronic Signature(s) Signed: 05/24/2023 7:49:05 AM By: [name] Entered By: [name] on 04/27/2023 09:12:27 -------------------------------------------------------------------------------- Multi Wound Chart Details Patient Name: Date of Service: [name], [name] 04/27/2023 8:45 A M Medical Record Number: [id number] Patient Account Number: [id number] Date of Birth/Sex: Treating RN: [birth year] (67 y.o. M) Primary Care Mikah Poss: Foye [name] Other Clinician: Referring Johnette Teigen: Treating [name]/Extender:  Kandis Cocking in Treatment: 32 Vital Signs Height(in): 74 Capillary Blood Glucose(mg/dl): 416 Weight(lbs): 606 Pulse(bpm): 78 Body Mass Index(BMI): 31.5 Blood Pressure(mmHg): 125/75 Temperature(F): 98.4 Respiratory Rate(breaths/min): 18 [1:Photos:] [N/A:N/A] Left Calcaneus Left, Dorsal Foot N/A Wound Location: Pressure Injury Pressure Injury N/A Wounding Event: Diabetic Wound/Ulcer of the Lower Diabetic Wound/Ulcer of the Lower N/A Primary Etiology: Extremity Extremity Cataracts, Sleep Apnea, Hypertension, Cataracts, Sleep Apnea, Hypertension, N/A Comorbid History: Type II Diabetes, Osteomyelitis, Type II Diabetes, Osteomyelitis, Neuropathy Neuropathy 03/09/2019 07/09/2022 N/A Date Acquired: 65 32 N/A Weeks of Treatment: Open Open N/A Wound Status: No No N/A  Wound Recurrence: 5.8x4.6x0.4 2.7x2.5x0.1 N/A Measurements L x W x D (cm) 20.954 5.301 N/A A (cm) : rea 8.382 0.53 N/A Volume (cm) : 3.00% 43.80% N/A % Reduction in A rea: 61.20% 43.70% N/A % Reduction in Volume: Grade 3 Grade 2 N/A Classification: Large Medium N/A Exudate A mount: Serosanguineous Serosanguineous N/A Exudate Type: red, brown red, brown N/A Exudate Color: Thickened Flat and Intact N/A Wound Margin: Large (67-100%) Small (1-33%) N/A Granulation A mount: Pink, Pale Red, Pink N/A Granulation Quality: Small (1-33%) Large (67-100%) N/A Necrotic A mount: [name], [name] ([id number]) [phone].pdf Page 3 of 7 Adherent [employer], Adherent [employer] N/A Necrotic Tissue: Fat Layer (Subcutaneous Tissue): Yes Fat Layer (Subcutaneous Tissue): Yes N/A Exposed Structures: Fascia: No Fascia: No Tendon: No Tendon: No Muscle: No Muscle: No Joint: No Joint: No Bone: No Bone: No Small (1-33%) Small (1-33%) N/A Epithelialization: Debridement - Selective/Open Wound Debridement - Selective/Open Wound N/A Debridement: Pre-procedure  Verification/Time Out 09:30 09:30 N/A Taken: Lidocaine 4% T opical Solution Lidocaine 4% Topical Solution N/A Pain Control: Callus, [facility] N/A Tissue Debrided: Skin/Epidermis Non-Viable Tissue N/A Level: 20.94 5.3 N/A Debridement A (sq cm): rea Curette Curette N/A Instrument: Minimum Minimum N/A Bleeding: Pressure Pressure N/A Hemostasis A chieved: 0 0 N/A Procedural Pain: 0 0 N/A Post Procedural Pain: Procedure was tolerated well Procedure was tolerated well N/A Debridement Treatment Response: 5.8x4.6x0.4 2.7x2.5x0.1 N/A Post Debridement Measurements L x W x D (cm) 8.382 0.53 N/A Post Debridement Volume: (cm) Callus: Yes Scarring: Yes N/A Periwound Skin Texture: Maceration: Yes Dry/Scaly: Yes N/A Periwound Skin Moisture: Dry/Scaly: No Maceration: No Erythema: Yes Rubor: No N/A Periwound Skin Color: Rubor: No No Abnormality No Abnormality N/A Temperature: Debridement Debridement N/A Procedures Performed: Treatment Notes Electronic Signature(s) Signed: 04/27/2023 9:44:13 AM By: [name] MD FACS Entered By: [name] on 04/27/2023 09:44:13 -------------------------------------------------------------------------------- Multi-Disciplinary Care Plan Details Patient Name: Date of Service: [place], [name] 04/27/2023 8:45 A M Medical Record Number: [id number] Patient Account Number: [id number] Date of Birth/Sex: Treating RN: 1956-11-17 (67 y.o. [name] Primary Care Ilamae Geng: Foye [name] Other Clinician: Referring Kameren Baade: Treating Glenn Christo/Extender: Kandis Cocking in Treatment: 32 Multidisciplinary Care Plan reviewed with physician Active Inactive Wound/Skin Impairment Nursing Diagnoses: Impaired tissue integrity Goals: Patient/caregiver will verbalize understanding of skin care regimen Date Initiated: 10/06/2022 Target Resolution Date: 05/13/2023 Goal Status: Active Ulcer/skin breakdown will  have a volume reduction of 30% by week 4 Date Initiated: 09/10/2022 Date Inactivated: 10/06/2022 Target Resolution Date: 10/08/2022 Goal Status: Unmet Unmet Reason: osteo, HBOT Ulcer/skin breakdown will have a volume reduction of 50% by week 8 Date Initiated: 10/06/2022 Date Inactivated: 02/14/2023 Target Resolution Date: 01/14/2023 Unmet Reason: insufficient perfusion Goal Status: Unmet to Left lower extremity Ulcer/skin breakdown will have a volume reduction of 80% by week 12 Date Initiated: 02/14/2023 Date Inactivated: 03/14/2023 Target Resolution Date: 03/15/2023 Goal Status: Unmet Unmet Reason: osteomyelitis Interventions: Assess ulceration(s) every visit Provide education on ulcer and skin care SACRAMENTO, MONDS ([id number]) [phone].pdf Page 4 of 7 Treatment Activities: Consult for HBO : 09/10/2022 Skin care regimen initiated : 09/10/2022 Notes: NPWT started 12/27/22 Electronic Signature(s) Signed: 05/24/2023 7:49:05 AM By: [name] Entered By: [name] on 04/27/2023 09:26:39 -------------------------------------------------------------------------------- Pain Assessment Details Patient Name: Date of Service: [place], [name] 04/27/2023 8:45 A M Medical Record Number: [id number] Patient Account Number: [id number] Date of Birth/Sex: Treating RN: [birth year] (67 y.o. [name] Primary Care Toyna Erisman: Foye [name] Other Clinician: Referring Damontay Alred: Treating Jessey Stehlin/Extender:  Kayleen Memos Weeks in Treatment: 32 Active Problems Location of Pain Severity and Description of Pain Patient Has Paino No Site Locations Pain Management and Medication Current Pain Management: Electronic Signature(s) Signed: 05/24/2023 7:49:05 AM By: [name] Entered By: [name] on 04/27/2023 09:04:22 -------------------------------------------------------------------------------- Patient/Caregiver Education Details Patient  Name: Date of Service: [name] [name] 5/15/2024andnbsp8:45 A M Medical Record Number: [id number] Patient Account Number: [id number] Date of Birth/Gender: Treating RN: [birth year] (67 y.o. [name] Primary Care Physician: Foye [name] Other Clinician: Referring Physician: Treating Physician/Extender: Kandis Cocking in Treatment: [phone] Education Assessment Education Provided To: Patient [name], [name] ([id number]) [id number] Page 5 of 7 Education Topics Provided Wound/Skin Impairment: Methods: Explain/Verbal Responses: State content correctly Electronic Signature(s) Signed: 05/24/2023 7:49:05 AM By: [name] Entered By: [name] on 04/27/2023 09:26:56 -------------------------------------------------------------------------------- Wound Assessment Details Patient Name: Date of Service: [place], [name] 04/27/2023 8:45 A M Medical Record Number: [id number] Patient Account Number: [id number] Date of Birth/Sex: Treating RN: 1956-05-08 (67 y.o. [name] Primary Care Natasha Burda: Foye [name] Other Clinician: Referring Ferris Fielden: Treating Laraine Samet/Extender: Kandis Cocking in Treatment: 32 Wound Status Wound Number: 1 Primary Diabetic Wound/Ulcer of the Lower Extremity Etiology: Wound Location: Left Calcaneus Wound Open Wounding Event: Pressure Injury Status: Date Acquired: 03/09/2019 Comorbid Cataracts, Sleep Apnea, Hypertension, Type II Diabetes, Weeks Of Treatment: 32 History: Osteomyelitis, Neuropathy Clustered Wound: No Photos Wound Measurements Length: (cm) 5.8 Width: (cm) 4.6 Depth: (cm) 0.4 Area: (cm) 20.954 Volume: (cm) 8.382 % Reduction in Area: 3% % Reduction in Volume: 61.2% Epithelialization: Small (1-33%) Tunneling: No Undermining: No Wound Description Classification: Grade 3 Wound Margin: Thickened Exudate Amount: Large Exudate Type:  Serosanguineous Exudate Color: red, brown Foul Odor After Cleansing: No Slough/Fibrino Yes Wound Bed Granulation Amount: Large (67-100%) Exposed Structure Granulation Quality: Pink, Pale Fascia Exposed: No Necrotic Amount: Small (1-33%) Fat Layer (Subcutaneous Tissue) Exposed: Yes Necrotic Quality: Adherent Slough Tendon Exposed: No Muscle Exposed: No Joint Exposed: No Bone Exposed: No Periwound Skin Texture Texture Color No Abnormalities Noted: No No Abnormalities Noted: No Callus: Yes Erythema: Yes Rubor: No [address] [name], [name] ([id number]) [id number] Page 6 of 7 No Abnormalities Noted: No Temperature / Pain Dry / Scaly: No Temperature: No Abnormality Maceration: Yes Electronic Signature(s) Signed: 05/24/2023 7:49:05 AM By: [name] Entered By: [name] on 04/27/2023 09:17:23 -------------------------------------------------------------------------------- Wound Assessment Details Patient Name: Date of Service: [place], [name] 04/27/2023 8:45 A M Medical Record Number: [id number] Patient Account Number: [id number] Date of Birth/Sex: Treating RN: [birth year] (67 y.o. [name] Primary Care Hafsa Lohn: Foye [name] Other Clinician: Referring Zelena Bushong: Treating Reign Bartnick/Extender: Kandis Cocking in Treatment: 32 Wound Status Wound Number: 2 Primary Diabetic Wound/Ulcer of the Lower Extremity Etiology: Wound Location: Left, Dorsal Foot Wound Open Wounding Event: Pressure Injury Status: Date Acquired: 07/09/2022 Comorbid Cataracts, Sleep Apnea, Hypertension, Type II Diabetes, Weeks Of Treatment: 32 History: Osteomyelitis, Neuropathy Clustered Wound: No Photos Wound Measurements Length: (cm) 2.7 Width: (cm) 2.5 Depth: (cm) 0.1 Area: (cm) 5.301 Volume: (cm) 0.53 % Reduction in Area: 43.8% % Reduction in Volume: 43.7% Epithelialization: Small (1-33%) Tunneling: No Undermining:  No Wound Description Classification: Grade 2 Wound Margin: Flat and Intact Exudate Amount: Medium Exudate Type: Serosanguineous Exudate Color: red, brown Foul Odor After Cleansing: No Slough/Fibrino Yes Wound Bed Granulation Amount: Small (1-33%) Exposed Structure Granulation Quality: Red, Pink Fascia Exposed: No Necrotic Amount: Large (67-100%) Fat Layer (Subcutaneous Tissue) Exposed: Yes Necrotic  Quality: Eschar, Adherent Slough Tendon Exposed: No Muscle Exposed: No Joint Exposed: No Bone Exposed: No Periwound Skin Texture Texture Color No Abnormalities Noted: Yes No Abnormalities Noted: Yes Moisture Temperature / Pain No Abnormalities Noted: No Temperature: No Abnormality Dry / Scaly: Yes Maceration: No [name], [name] ([id number]) [id number] Page 7 of 7 Electronic Signature(s) Signed: 05/24/2023 7:49:05 AM By: [name] Entered By: [name] on 04/27/2023 09:18:16 -------------------------------------------------------------------------------- Vitals Details Patient Name: Date of Service: [name], [name] 04/27/2023 8:45 A M Medical Record Number: [id number] Patient Account Number: [id number] Date of Birth/Sex: Treating RN: Apr 07, 1956 (67 y.o. [name] Primary Care Cieara Stierwalt: Foye [name] Other Clinician: Referring Salahuddin Arismendez: Treating Shivaun Bilello/Extender: Kandis Cocking in Treatment: 32 Vital Signs Time Taken: 09:03 Temperature (F): 98.4 Height (in): 74 Pulse (bpm): 78 Weight (lbs): 245 Respiratory Rate (breaths/min): 18 Body Mass Index (BMI): 31.5 Blood Pressure (mmHg): 125/75 Capillary Blood Glucose (mg/dl): 469 Reference Range: 80 - 120 mg / dl Electronic Signature(s) Signed: 05/24/2023 7:49:05 AM By: [name] Entered By: [name] on 04/27/2023 09:04:15

## 2023-05-24 NOTE — Progress Notes (Signed)
NEDIM, VENN [name] ([id number]) [id number] Page 1 of 13 Visit Report for 04/27/2023 Chief Complaint Document Details Patient Name: Date of Service: [name]. 04/27/2023 8:45 A M Medical Record Number: [id number] Patient Account Number: [id number] Date of Birth/Sex: Treating RN: 1956-02-22 (67 y.o. M) Primary Care Provider: Foye [name] Other Clinician: Referring Provider: Treating Provider/Extender: Kandis Cocking in Treatment: 32 Information Obtained from: Patient Chief Complaint Patients presents for treatment of an open diabetic ulcer and evaluation for hyperbaric oxygen therapy Electronic Signature(s) Signed: 04/27/2023 9:44:23 AM By: [name] MD FACS Entered By: [name] on 04/27/2023 09:44:23 -------------------------------------------------------------------------------- Debridement Details Patient Name: Date of Service: [name]. 04/27/2023 8:45 A M Medical Record Number: [id number] Patient Account Number: [id number] Date of Birth/Sex: Treating RN: 1956-05-14 (67 y.o. [name] Primary Care Provider: Foye [name] Other Clinician: Referring Provider: Treating Provider/Extender: Kandis Cocking in Treatment: 32 Debridement Performed for Assessment: Wound #1 Left Calcaneus Performed By: Physician [name], MD Debridement Type: Debridement Severity of Tissue Pre Debridement: Fat layer exposed Level of Consciousness (Pre-procedure): Awake and Alert Pre-procedure Verification/Time Out Yes - 09:30 Taken: Start Time: 09:31 Pain Control: Lidocaine 4% T opical Solution Percent of Wound Bed Debrided: 100% T Area Debrided (cm): otal 20.94 Tissue and other material debrided: Viable, Non-Viable, Callus, Slough, Skin: Dermis , Skin: Epidermis, Slough Level: Skin/Epidermis Debridement Description: Selective/Open Wound Instrument: Curette Bleeding:  Minimum Hemostasis Achieved: Pressure End Time: 09:35 Procedural Pain: 0 Post Procedural Pain: 0 Response to Treatment: Procedure was tolerated well Level of Consciousness (Post- Awake and Alert procedure): Post Debridement Measurements of Total Wound Length: (cm) 5.8 Width: (cm) 4.6 Depth: (cm) 0.4 Volume: (cm) 8.382 Character of Wound/Ulcer Post Debridement: Stable Severity of Tissue Post Debridement: Fat layer exposed Post Procedure Diagnosis Same as Pre-procedure Notes Scribed for Dr Lady Gary by [name] RN [place], [place] [name] ([id number]) [phone].pdf Page 2 of 13 Electronic Signature(s) Signed: 04/27/2023 11:57:16 AM By: [name] MD FACS Signed: 05/24/2023 7:49:05 AM By: [name] Entered By: [name] on 04/27/2023 09:36:59 -------------------------------------------------------------------------------- Debridement Details Patient Name: Date of Service: [name]. 04/27/2023 8:45 A M Medical Record Number: [id number] Patient Account Number: [id number] Date of Birth/Sex: Treating RN: 1956-06-06 (67 y.o. [name] Primary Care Provider: Foye [name] Other Clinician: Referring Provider: Treating Provider/Extender: Kandis Cocking in Treatment: 32 Debridement Performed for Assessment: Wound #2 Left,Dorsal Foot Performed By: Physician [name], MD Debridement Type: Debridement Severity of Tissue Pre Debridement: Fat layer exposed Level of Consciousness (Pre-procedure): Awake and Alert Pre-procedure Verification/Time Out Yes - 09:30 Taken: Start Time: 09:31 Pain Control: Lidocaine 4% T opical Solution Percent of Wound Bed Debrided: 100% T Area Debrided (cm): otal 5.3 Tissue and other material debrided: Non-Viable, Slough, Slough Level: Non-Viable Tissue Debridement Description: Selective/Open Wound Instrument: Curette Bleeding: Minimum Hemostasis Achieved: Pressure End  Time: 09:35 Procedural Pain: 0 Post Procedural Pain: 0 Response to Treatment: Procedure was tolerated well Level of Consciousness (Post- Awake and Alert procedure): Post Debridement Measurements of Total Wound Length: (cm) 2.7 Width: (cm) 2.5 Depth: (cm) 0.1 Volume: (cm) 0.53 Character of Wound/Ulcer Post Debridement: Improved Severity of Tissue Post Debridement: Fat layer exposed Post Procedure Diagnosis Same as Pre-procedure Notes Scribed for Dr Lady Gary by [name] RN Electronic Signature(s) Signed: 04/27/2023 11:57:16 AM By: [name] MD FACS Signed: 05/24/2023 7:49:05 AM By: [name] Entered By: [name] on 04/27/2023 09:38:46 -------------------------------------------------------------------------------- HPI Details Patient Name: Date of  Service: [name]. 04/27/2023 8:45 A M Medical Record Number: [id number] Patient Account Number: [id number] Date of Birth/Sex: Treating RN: 11/04/1956 (67 y.o. M) Primary Care Provider: Foye [name] Other Clinician: Referring Provider: Treating Provider/Extender: Kandis Cocking in Treatment: 12 [name] History of Present Illness [name] ([id number]) [id number] Page 3 of 13 HPI Description: ADMISSION 09/10/2022 This is a 67 year old poorly controlled type II diabetic (last hemoglobin A1c 10.8%) who has had an ulcer on his heel for over 3 years. He has been seen in multiple wound care centers, including [facility] and [facility]. He reports that at least 3 doctors have recommended that he undergo below-knee amputation. He most recently met with Dr. Reuel Boom, a vascular surgeon affiliated with [facility]. 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 Unfortunately, the patient has some extenuating social circumstances including the fact that he cares for his wife who has  stage IV colon cancer and still works, driving vehicles for [employer]. 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 He was recently hospitalized at [facility] for sepsis and currently has a PICC line through which he receives IV antibiotics. He reports having had another MRI during that hospital stay along with a chest x-ray and EKG. He apparently contacted one of the hyperbaric therapy techs here and asked a number of questions about hyperbaric oxygen treatments. He subsequently self-referred to our center to undergo further evaluation and management. I mention to him that [facility] actually has hyperbaric chambers, but he states that he lives in [place] and this would be more convenient for him given the intensive nature of the therapy and time requirement. ABI in clinic today was 0.94. The patient actually has 2 wounds. There is a wound on the dorsum of his left foot with heavy black eschar and slough present. After debridement, this was demonstrated to involve the muscle and the extensor tendons are exposed. On his heel, there is essentially a "shark bite" type wound, with much of the heel fat pad absent. The muscle layer is exposed. There is blue-green staining around the perimeter of the wound, but no significant odor. He says he has been applying collagen to the wound on his heel and Silvadene and Betadine to the wound on his dorsal foot. 09/20/2022: The heel wound is quite macerated with wet periwound callus. There is slough accumulation on the surface. The dorsal foot wound looks better this week. There is still exposed tendon, but it is fairly clean with just a little biofilm buildup. We are still working on gathering the required documentation to submit for pretreatment review for hyperbaric oxygen therapy. 09/29/2022: The dorsal foot wound continues to improve. I do not see any exposed tendon at this point. There is just some slough accumulation on the  wound surface. He continues to have very wet macerated periwound callus on his heel. There is slough on the surface, but it is loose and thin. There is an area of undermining at the 11 o'clock position, but the overlying skin and subcutaneous tissue is healthy and viable. He has been approved for hyperbaric oxygen therapy and will start treatment tomorrow. 10/06/2022: Continued contraction and improvement of the dorsal foot wound. There is just a little bit of slough accumulation on the wound surface. The periwound callus continues to accumulate on the heel wound and it is quite macerated. It is also persistently found with  blue-green staining present. He did initiate his hyperbaric oxygen therapy, but has had significant difficulty with decompression. He will be going to ENT to have PE tubes placed. 10/18/2022: His hyperbaric oxygen therapy is on hold while his otological issues are being addressed. He came in again today with the periwound skin on both the dorsal aspect of his foot and his calcaneus completely macerated. The blue-green discoloration, however, has abated and the undermining on the calcaneus has improved. The dorsal foot wound has also contracted somewhat. 10/26/2022: The dorsal foot wound continues to contract and fill with good granulation tissue. The heel, once again, has a rim of macerated callus, but the undermining and tunneling continues to contract. He has completed his oral antibiotics and has been using the [facility] topical compounded antibiotic for his dressing changes at home. He is scheduled to see ENT this afternoon. 11/08/2022: The dorsal foot wound is flush with the surrounding skin and has a good granulation tissue surface. There is some slough accumulation. As usual, the heel has a rim of macerated callus but the dimensions are smaller and the tunneling and undermining have contracted further. He has resumed his hyperbaric oxygen therapy. 12/12; patient seen for  wound evaluation. He is tolerating HBO although we could not dive him yesterday because of relative hypoglycemia and the fact he had given himself NovoLog insulin before he came to clinic. Today his blood sugar is in the 180 range she should be fine. He has a large wound on the plantar calcaneus on the right and a more superficial area on the dorsal foot. He is using a scooter for offloading 11/30/2022: The dorsal foot wound continues to contract. There is some slough on the wound surface. The large calcaneus wound has heaped up wet callus around the margins. Continued undermining. 12/08/2022: The dorsal foot wound is flat and flush with the surrounding skin surface. There is some slough present. Once again, the calcaneal wound has thick, absolutely macerated callus hanging off in tatters around the edges. The patient cannot explain to me why this part of his wound gets so wet. There is slough on the surface. The undermined portion of the wound has filled in. 12/17/2022: Earlier this week, he presented for his hyperbaric oxygen therapy and was hypotensive and ill-appearing. He also had what appeared to be an infected sebaceous cyst on his right upper arm. He was sent to the emergency department. He was given a fluid bolus and the ED provider lanced the cyst. He was prescribed doxycycline. He seems to be feeling better today. He notes that he has had copious drainage from his foot. On further questioning, he states that he has not been taking his oral diuretic. The dorsal foot wound has expanded somewhat, but is actually more superficial. The plantar heel wound is about the same. 12/27/2022: The dorsal foot wound has epithelialized further. The plantar foot wound is about the same size but has heaped up macerated callus around the perimeter. The intake nurse noted a tiny fragment of bone on his dressings and there is now an area at about the 5 o'clock position where 1 can palpate bone through a small slit  in the soft tissues. The wound on his arm is contracting but he still likely has cyst wall present due to the manner in which the infected sebaceous cyst was addressed in the ER. 01/01/2023: The wound on his shoulder has contracted considerably. The dorsal foot wound has some slough on the surface but also looks to be  improving. The plantar foot wound is basically unchanged. 01/10/2023: The wound on his shoulder continues to diminish in size and depth. The dorsal foot wound is improving with just a light layer of slough on the surface. The plantar foot wound appears to be contracting but still has thick wet callus around the perimeter. We did in chamber T cPO2 monitoring during his last hyperbaric oxygen therapy. The results show that he has extremely poor tissue perfusion at room air and 1 atm of pressure. He does respond extremely well to hyperbaric oxygen therapy, however. 01/17/2023: The wound on his shoulder is down to 0.6 cm in depth, down from 1 cm last week. The dorsal foot wound is quite a bit smaller this week with just some light slough on the surface. The plantar foot wound is also smaller and the area of bone that was exposed at the posterior heel is now covered. There is still substantial wet callus around the perimeter. He is scheduled to undergo formal vascular studies sometime next week. 01/24/2023: The wound on his shoulder has almost completely filled in, with just a little bit of depth remaining. The dorsal foot wound got macerated; it appears that his home health nurses are applying the drape for his heel wound over the top of this, meaning that the dorsal foot wound is not getting changed as regularly as it is supposed to. There is some slough on the surface. The plantar foot wound has thick wet callus around the perimeter. The dimensions measure a little bit smaller. He is having his vascular studies done this afternoon. 02/07/2023: The wound on his shoulder is healed. The dorsal foot  wound continues to be subjected to excessive moisture due to the home health nurses applying the drape from his wound VAC over the top of it. It does measure smaller, however. There is more bone exposed on his calcaneus, unfortunately. He has heaped up wet callus around the edges of the heel wound again. He is going to undergo angiography with Dr. Lenell Antu on Friday. Hopefully there will be intervention available to him that may aid in his wound healing. 02/14/2023: He did not get his angiogram last week with Dr. Lenell Antu because he did not have a ride home. The dorsal foot wound looks a little bit better with just a light layer of slough. Unfortunately, there is more bone exposed at the calcaneus and I am concerned that we are nearing a point at which his leg will not be salvageable. [name], [name] ([id number]) [id number] Page 4 of 13 3/18; patient presents for follow-up. He was recently hospitalized on 3/11 for sepsis secondary to diabetic foot wound. He was dishcharged with Augmentin and doxycycline and plan is for 6 weeks of this. He currently has a wound VAC. Wound has declined in appearance. Amputation was recommended in the hospital however patient is not ready for this. Patient states he would like to stop HBO. He currently denies systemic signs of infection. 03/07/2023: The calcaneal wound continues to deteriorate. There is significant undermining around the posterior aspect of the calcaneus and bone is palpable all the way around. The dorsal foot wound has some leathery eschar and slough present. 03/14/2023: The dorsal foot wound is somewhat leathery with some dark eschar and slough present. The calcaneal wound is deteriorating further. There are fragments of the bone hanging loose. He does have an appointment with Dr. Lenell Antu next week on Tuesday to discuss amputation. 04/04/2023: The heel ulcer is larger, but shallower. There is heaped up  with callus around the edges. The  dorsal foot wound is stable. He has appointment with Dr. Lenell Antu was rescheduled for next Tuesday. He is working on arrangements for his dog and his wife to facilitate below-knee amputation. 04/12/2023: The heel ulcer looks about the same. Bone is not exposed. There is moist callus heaped up around the edges. The dorsal foot wound is stable. He will be seeing Dr. Lenell Antu this afternoon to discuss amputation. 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 The dorsal foot wound is a little bit smaller and shallower. It is not as irritated as it often looks. There is slough on the surface. He met with Dr. Lenell Antu and is still not ready to pursue amputation at this time. Electronic Signature(s) Signed: 04/27/2023 9:45:18 AM By: [name] MD FACS Entered By: [name] on 04/27/2023 09:45:18 -------------------------------------------------------------------------------- Physical Exam Details Patient Name: Date of Service: [place], [name] 04/27/2023 8:45 A M Medical Record Number: [id number] Patient Account Number: [id number] Date of Birth/Sex: Treating RN: March 23, 1956 (67 y.o. M) Primary Care Provider: Foye [name] Other Clinician: Referring Provider: Treating Provider/Extender: Kandis Cocking in Treatment: 32 Constitutional . . . . no acute distress. Respiratory Normal work of breathing on room air. Notes 04/27/2023: The heel ulcer is really unchanged and looks, as it always does, macerated around the edges with a layer of slough on the surface. The dorsal foot wound is a little bit smaller and shallower. It is not as irritated as it often looks. There is slough on the surface. Electronic Signature(s) Signed: 04/27/2023 9:45:49 AM By: [name] MD FACS Entered By: [name] on 04/27/2023  09:45:48 -------------------------------------------------------------------------------- Physician Orders Details Patient Name: Date of Service: [place], [name] 04/27/2023 8:45 A M Medical Record Number: [id number] Patient Account Number: [id number] Date of Birth/Sex: Treating RN: [birth year] (67 y.o. 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 - Dr. Lady Gary rm 2 Return A CHEY, BRENDLE [name] ([id number]) [phone].pdf Page 5 of 13 Bathing/ Shower/ Hygiene May shower with protection but do not get wound dressing(s) wet. Protect dressing(s) with water repellant cover (for example, large plastic bag) or a cast cover and may then take shower. Edema Control - Lymphedema / SCD / Other Left Lower Extremity Elevate legs to the level of the heart or above for 30 minutes daily and/or when sitting for 3-4 times a day throughout the day. Avoid standing for long periods of time. Moisturize legs daily. Compression stocking or Garment 30-40 mm/Hg pressure to: - left leg daily Off-Loading Wound #1 Left Calcaneus Other: - use knee scooter to ambulate, minimal weight bearing right foot Wound Treatment Wound #1 - Calcaneus Wound Laterality: Left Cleanser: Soap and Water 1 x Per Day/30 Days Discharge Instructions: May shower and wash wound with dial antibacterial soap and water prior to dressing  change. Cleanser: Vashe 5.8 (oz) 1 x Per Day/30 Days Discharge Instructions: Cleanse the wound  with Vashe prior to applying a clean dressing using gauze sponges, not tissue or cotton balls. Peri-Wound Care: Zinc Oxide Ointment 30g tube 1 x Per Day/30 Days Discharge Instructions: Apply Zinc Oxide to periwound with each dressing change Topical: keystone 1 x Per Day/30 Days Secondary Dressing: Woven Gauze Sponge, Non-Sterile 4x4 in (Generic) 1 x Per Day/30 Days Discharge Instructions: Wet with Vashe we to dry. Secondary Dressing: Zetuvit Plus 4x8 in (Generic) 1 x Per Day/30 Days Discharge Instructions: Apply over primary dressing as directed. Secured With: Coban Self-Adherent Wrap 4x5 (in/yd) (Generic) 1 x Per Day/30 Days Discharge Instructions: Secure with Coban as directed. Secured With: [employer], 4.5x3.1 (in/yd) (Generic) 1 x Per Day/30 Days Discharge Instructions: Secure with Kerlix as directed. Wound #2 - Foot Wound Laterality: Dorsal, Left Cleanser: Soap and Water 1 x Per Day/30 Days Discharge Instructions: May shower and wash wound with dial antibacterial soap and water prior to dressing change. Cleanser: Vashe 5.8 (oz) 1 x Per Day/30 Days Discharge Instructions: Cleanse the wound with Vashe prior to applying a clean dressing using gauze sponges, not tissue or cotton balls. Cleanser: Wound Cleanser (Generic) 1 x Per Day/30 Days Discharge Instructions: Cleanse the wound with wound cleanser prior to applying a clean dressing using gauze sponges, not tissue or cotton balls. Peri-Wound Care: keystone 1 x Per Day/30 Days Prim Dressing: Hydrofera Blue Ready Transfer Foam, 2.5x2.5 (in/in) (Generic) 1 x Per Day/30 Days ary Discharge Instructions: Apply directly to wound bed as directed Prim Dressing: MediHoney Gel, tube 1.5 (oz) 1 x Per Day/30 Days ary Discharge Instructions: Apply to wound bed as instructed Secondary Dressing: Zetuvit Plus Silicone Border Dressing 5x5 (in/in)  (Generic) 1 x Per Day/30 Days Discharge Instructions: Apply silicone border over primary dressing as directed. Secured With: Coban Self-Adherent Wrap 4x5 (in/yd) 1 x Per Day/30 Days Discharge Instructions: Secure with Coban as directed. Secured With: [employer], 4.5x3.1 (in/yd) 1 x Per Day/30 Days Discharge Instructions: Secure with Kerlix as directed. Compression Stockings: Circaid Juxta Lite Compression Wrap Left Leg Compression Amount: 30-40 mmHG Discharge Instructions: Apply Circaid Juxta Lite Compression Wrap daily as instructed. Apply first thing in the morning, remove at night before bed. Patient Medications llergies: bee venom protein (honey bee) ZIMIR, STENBERG [name] ([id number]) [id number] Page 6 of 13 Notifications Medication Indication Start End 04/27/2023 amoxicillin-pot clavulanate DOSE oral 875 mg-125 mg tablet - 1 tab p.o. twice daily x 6 weeks 04/27/2023 doxycycline hyclate DOSE oral 100 mg capsule - 1 capsule p.o. twice daily x 6 weeks Electronic Signature(s) Signed: 04/27/2023 9:49:35 AM By: [name] MD FACS Entered By: [name] on 04/27/2023 09:49:33 -------------------------------------------------------------------------------- Problem List Details Patient Name: Date of Service: [place], [name] 04/27/2023 8:45 A M Medical Record Number: [id number] Patient Account Number: [id number] Date of Birth/Sex: Treating RN: 1956/08/19 (67 y.o. 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 09/10/2022 No Yes L97.523 Non-pressure chronic ulcer of other part of left foot with necrosis of muscle 09/10/2022 No Yes N82.956 Other chronic osteomyelitis,  left ankle and foot 09/10/2022 No Yes E11.65 Type 2 diabetes mellitus with hyperglycemia 09/10/2022 No Yes E11.621 Type 2 diabetes mellitus with foot ulcer 09/10/2022 No Yes Inactive Problems ICD-10 Code Description Active Date Inactive Date L72.3 Sebaceous cyst 12/17/2022 12/17/2022 Resolved Problems ICD-10 Code  Description Active Date Resolved Date L98.492 Non-pressure chronic ulcer of skin of other sites with fat layer exposed 12/17/2022 01/03/2023 Electronic Signature(s) Signed: 04/27/2023 9:43:49 AM By: [name] MD FACS Entered By: [name] on 04/27/2023 09:43:49 [name], [name] ([id number]) [id number] Page 7 of 13 -------------------------------------------------------------------------------- Progress Note Details Patient Name: Date of Service: [place], [name] 04/27/2023 8:45 A M Medical Record Number: [id number] Patient Account Number: [id number] Date of Birth/Sex: Treating RN: 1956-03-26 (67 y.o. M) Primary Care Provider: Foye [name] Other Clinician: Referring Provider: Treating Provider/Extender: Kandis Cocking in Treatment: 32 Subjective Chief Complaint Information obtained from Patient Patients presents for treatment of an open diabetic ulcer and evaluation for hyperbaric oxygen therapy History of Present Illness (HPI) ADMISSION 09/10/2022 This is a 67 year old poorly controlled type II diabetic (last hemoglobin A1c 10.8%) who has had an ulcer on his heel for over 3 years. He has been seen in multiple wound care centers, including [facility] and [facility]. He reports that at least 3 doctors have recommended that he undergo below-knee amputation. He most recently met with Dr. Reuel Boom, a vascular surgeon affiliated with [facility]. 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 Unfortunately, the patient has some extenuating  social circumstances including the fact that he cares for his wife who has stage IV colon cancer and still works, driving vehicles for [employer]. 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 He was recently hospitalized at [facility] for sepsis and currently has a PICC line through which he receives IV antibiotics. He reports having had another MRI during that hospital stay along with a chest x-ray and EKG. He apparently contacted one of the hyperbaric therapy techs here and asked a number of questions about hyperbaric oxygen treatments. He subsequently self-referred to our center to undergo further evaluation and management. I mention to him that [facility] actually has hyperbaric chambers, but he states that he lives in [place] and this would be more convenient for him given the intensive nature of the therapy and time requirement. ABI in clinic today was 0.94. The patient actually has 2 wounds. There is a wound on the dorsum of his left foot with heavy black eschar and slough present. After debridement, this was demonstrated to involve the muscle and the extensor tendons are exposed. On his heel, there is essentially a "shark bite" type wound, with much of the heel fat pad absent. The muscle layer is exposed. There is blue-green staining around the perimeter of the wound, but no significant odor. He says he has been applying collagen to the wound on his heel and Silvadene and Betadine to the wound on his dorsal foot. 09/20/2022: The heel wound is quite macerated with wet periwound callus. There is slough accumulation on the surface. The dorsal foot wound looks better this week. There is still exposed tendon, but it is fairly clean with just a little biofilm buildup. We are still working on gathering the required documentation to submit for pretreatment review for hyperbaric oxygen therapy. 09/29/2022: The dorsal foot wound continues to improve. I do not see any  exposed tendon at this point. There is just some slough accumulation on the wound surface. He continues to have very wet macerated periwound callus on his heel. There is slough on the surface, but it is loose and thin. There is an area of undermining at the 11 o'clock position, but  the overlying skin and subcutaneous tissue is healthy and viable. He has been approved for hyperbaric oxygen therapy and will start treatment tomorrow. 10/06/2022: Continued contraction and improvement of the dorsal foot wound. There is just a little bit of slough accumulation on the wound surface. The periwound callus continues to accumulate on the heel wound and it is quite macerated. It is also persistently found with blue-green staining present. He did initiate his hyperbaric oxygen therapy, but has had significant difficulty with decompression. He will be going to ENT to have PE tubes placed. 10/18/2022: His hyperbaric oxygen therapy is on hold while his otological issues are being addressed. He came in again today with the periwound skin on both the dorsal aspect of his foot and his calcaneus completely macerated. The blue-green discoloration, however, has abated and the undermining on the calcaneus has improved. The dorsal foot wound has also contracted somewhat. 10/26/2022: The dorsal foot wound continues to contract and fill with good granulation tissue. The heel, once again, has a rim of macerated callus, but the undermining and tunneling continues to contract. He has completed his oral antibiotics and has been using the [facility] topical compounded antibiotic for his dressing changes at home. He is scheduled to see ENT this afternoon. 11/08/2022: The dorsal foot wound is flush with the surrounding skin and has a good granulation tissue surface. There is some slough accumulation. As usual, the heel has a rim of macerated callus but the dimensions are smaller and the tunneling and undermining have contracted further.  He has resumed his hyperbaric oxygen therapy. 12/12; patient seen for wound evaluation. He is tolerating HBO although we could not dive him yesterday because of relative hypoglycemia and the fact he had given himself NovoLog insulin before he came to clinic. Today his blood sugar is in the 180 range she should be fine. He has a large wound on the plantar calcaneus on the right and a more superficial area on the dorsal foot. He is using a scooter for offloading 11/30/2022: The dorsal foot wound continues to contract. There is some slough on the wound surface. The large calcaneus wound has heaped up wet callus around the margins. Continued undermining. 12/08/2022: The dorsal foot wound is flat and flush with the surrounding skin surface. There is some slough present. Once again, the calcaneal wound has thick, absolutely macerated callus hanging off in tatters around the edges. The patient cannot explain to me why this part of his wound gets so wet. There is slough on the surface. The undermined portion of the wound has filled in. 12/17/2022: Earlier this week, he presented for his hyperbaric oxygen therapy and was hypotensive and ill-appearing. He also had what appeared to be an infected sebaceous cyst on his right upper arm. He was sent to the emergency department. He was given a fluid bolus and the ED provider lanced the cyst. He was prescribed doxycycline. He seems to be feeling better today. He notes that he has had copious drainage from his foot. On further questioning, he states that he has not been taking his oral diuretic. The dorsal foot wound has expanded somewhat, but is actually more superficial. The plantar heel wound is about the same. 12/27/2022: The dorsal foot wound has epithelialized further. The plantar foot wound is about the same size but has heaped up macerated callus around the perimeter. The intake nurse noted a tiny fragment of bone on his dressings and there is now an area at  about the 5 o'clock position  where 1 can palpate bone through a small slit in the soft tissues. The wound on his arm is contracting but he still likely has cyst wall present due to the manner in which the infected sebaceous cyst was addressed in the ER. 01/01/2023: The wound on his shoulder has contracted considerably. The dorsal foot wound has some slough on the surface but also looks to be improving. The plantar foot wound is basically unchanged. 01/10/2023: The wound on his shoulder continues to diminish in size and depth. The dorsal foot wound is improving with just a light layer of slough on the surface. The plantar foot wound appears to be contracting but still has thick wet callus around the perimeter. We did in chamber T cPO2 monitoring during his last hyperbaric oxygen therapy. The results show that he has extremely poor tissue perfusion at room air and 1 atm of pressure. He does respond extremely [name], [name] ([id number]) [id number] Page 8 of 13 well to hyperbaric oxygen therapy, however. 01/17/2023: The wound on his shoulder is down to 0.6 cm in depth, down from 1 cm last week. The dorsal foot wound is quite a bit smaller this week with just some light slough on the surface. The plantar foot wound is also smaller and the area of bone that was exposed at the posterior heel is now covered. There is still substantial wet callus around the perimeter. He is scheduled to undergo formal vascular studies sometime next week. 01/24/2023: The wound on his shoulder has almost completely filled in, with just a little bit of depth remaining. The dorsal foot wound got macerated; it appears that his home health nurses are applying the drape for his heel wound over the top of this, meaning that the dorsal foot wound is not getting changed as regularly as it is supposed to. There is some slough on the surface. The plantar foot wound has thick wet callus around the perimeter. The  dimensions measure a little bit smaller. He is having his vascular studies done this afternoon. 02/07/2023: The wound on his shoulder is healed. The dorsal foot wound continues to be subjected to excessive moisture due to the home health nurses applying the drape from his wound VAC over the top of it. It does measure smaller, however. There is more bone exposed on his calcaneus, unfortunately. He has heaped up wet callus around the edges of the heel wound again. He is going to undergo angiography with Dr. Lenell Antu on Friday. Hopefully there will be intervention available to him that may aid in his wound healing. 02/14/2023: He did not get his angiogram last week with Dr. Lenell Antu because he did not have a ride home. The dorsal foot wound looks a little bit better with just a light layer of slough. Unfortunately, there is more bone exposed at the calcaneus and I am concerned that we are nearing a point at which his leg will not be salvageable. 3/18; patient presents for follow-up. He was recently hospitalized on 3/11 for sepsis secondary to diabetic foot wound. He was dishcharged with Augmentin and doxycycline and plan is for 6 weeks of this. He currently has a wound VAC. Wound has declined in appearance. Amputation was recommended in the hospital however patient is not ready for this. Patient states he would like to stop HBO. He currently denies systemic signs of infection. 03/07/2023: The calcaneal wound continues to deteriorate. There is significant undermining around the posterior aspect of the calcaneus and bone is palpable all the way around.  The dorsal foot wound has some leathery eschar and slough present. 03/14/2023: The dorsal foot wound is somewhat leathery with some dark eschar and slough present. The calcaneal wound is deteriorating further. There are fragments of the bone hanging loose. He does have an appointment with Dr. Lenell Antu next week on Tuesday to discuss amputation. 04/04/2023: The heel  ulcer is larger, but shallower. There is heaped up with callus around the edges. The dorsal foot wound is stable. He has appointment with Dr. Lenell Antu was rescheduled for next Tuesday. He is working on arrangements for his dog and his wife to facilitate below-knee amputation. 04/12/2023: The heel ulcer looks about the same. Bone is not exposed. There is moist callus heaped up around the edges. The dorsal foot wound is stable. He will be seeing Dr. Lenell Antu this afternoon to discuss amputation. 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 The dorsal foot wound is a little bit smaller and shallower. It is not as irritated as it often looks. There is slough on the surface. He met with Dr. Lenell Antu and is still not ready to pursue amputation at this time. Patient History Family History Cancer - Father,Mother, Diabetes - Mother,Father, Lung Disease - Father, Thyroid Problems - Mother, No family history of Heart Disease, Hereditary Spherocytosis, Hypertension, Kidney Disease, Seizures, Stroke, Tuberculosis. Social History Never smoker, Marital Status - Married, Alcohol Use - Never, Drug Use - No History, Caffeine Use - Daily. Medical History Eyes Patient has history of Cataracts Denies history of Glaucoma, Optic Neuritis Ear/Nose/Mouth/Throat Denies history of Chronic sinus problems/congestion, Middle ear problems Respiratory Patient has history of Sleep Apnea Cardiovascular Patient has history of Hypertension Gastrointestinal Denies history of Cirrhosis , Colitis, Crohns, Hepatitis A, Hepatitis B, Hepatitis C Endocrine Patient has history of Type II Diabetes Immunological Denies history of Lupus Erythematosus, Raynauds, Scleroderma Musculoskeletal Patient has history of Osteomyelitis - 2023 Neurologic Patient has history of Neuropathy - Bila lower extremities Oncologic Denies history of Received  Chemotherapy, Received Radiation Psychiatric Denies history of Anorexia/bulimia, Confinement Anxiety Hospitalization/Surgery History - I and D Left calcaneus. - back surgery- laminectomy. - eye surgery- Bila cataracts. - shoulder arthroscopy. Medical A Surgical History Notes nd Cardiovascular hyperlipidemia Genitourinary AKI Objective JAYQUON, SHARRETT ([id number]) [phone].pdf Page 9 of 13 Constitutional no acute distress. Vitals Time Taken: 9:03 AM, Height: 74 in, Weight: 245 lbs, BMI: 31.5, Temperature: 98.4 F, Pulse: 78 bpm, Respiratory Rate: 18 breaths/min, Blood Pressure: 125/75 mmHg, Capillary Blood Glucose: 212 mg/dl. Respiratory Normal work of breathing on room air. General Notes: 04/27/2023: The heel ulcer is really unchanged and looks, as it always does, macerated around the edges with a layer of slough on the surface. The dorsal foot wound is a little bit smaller and shallower. It is not as irritated as it often looks. There is slough on the surface. Integumentary (Hair, Skin) Wound #1 status is Open. Original cause of wound was Pressure Injury. The date acquired was: 03/09/2019. The wound has been in treatment 32 weeks. The wound is located on the Left Calcaneus. The wound measures 5.8cm length x 4.6cm width x 0.4cm depth; 20.954cm^2 area and 8.382cm^3 volume. There is Fat Layer (Subcutaneous Tissue) exposed. There is no tunneling or undermining noted. There is a large amount of serosanguineous drainage noted. The wound margin is thickened. There is large (67-100%) pink, pale granulation within the wound bed. There is a small (1-33%) amount of necrotic  tissue within the wound bed including Adherent Slough. The periwound skin appearance exhibited: Callus, Maceration, Erythema. The periwound skin appearance did not exhibit: Dry/Scaly, Rubor. The surrounding wound skin color is noted with erythema. Periwound temperature was noted as No Abnormality. Wound #2  status is Open. Original cause of wound was Pressure Injury. The date acquired was: 07/09/2022. The wound has been in treatment 32 weeks. The wound is located on the Left,Dorsal Foot. The wound measures 2.7cm length x 2.5cm width x 0.1cm depth; 5.301cm^2 area and 0.53cm^3 volume. There is Fat Layer (Subcutaneous Tissue) exposed. There is no tunneling or undermining noted. There is a medium amount of serosanguineous drainage noted. The wound margin is flat and intact. There is small (1-33%) red, pink granulation within the wound bed. There is a large (67-100%) amount of necrotic tissue within the wound bed including Eschar and Adherent Slough. The periwound skin appearance had no abnormalities noted for texture. The periwound skin appearance had no abnormalities noted for color. The periwound skin appearance exhibited: Dry/Scaly. The periwound skin appearance did not exhibit: Maceration. Periwound temperature was noted as No Abnormality. 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 There was a Selective/Open Wound Skin/Epidermis Debridement with a total area of 20.94 sq cm performed by [name], MD. With the following instrument(s): Curette to remove Viable and Non-Viable tissue/material. Material removed includes Callus, Slough, Skin: Dermis, and Skin: Epidermis after achieving pain control using Lidocaine 4% Topical Solution. No specimens were taken. A time out was conducted at 09:30, prior to the start of the procedure. A  Minimum amount of bleeding was controlled with Pressure. The procedure was tolerated well with a pain level of 0 throughout and a pain level of 0 following the procedure. Post Debridement Measurements: 5.8cm length x 4.6cm width x 0.4cm depth; 8.382cm^3 volume. Character of Wound/Ulcer Post Debridement is stable. Severity of Tissue Post Debridement is: Fat layer exposed. Post procedure Diagnosis Wound #1: Same as Pre-Procedure General Notes: Scribed for Dr Lady Gary by [name] RN. 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 There was a Selective/Open Wound Non-Viable Tissue Debridement with a total area of 5.3 sq cm performed by [name], MD. With the following instrument(s): Curette to remove Non-Viable tissue/material. Material removed includes [facility] after achieving pain control using Lidocaine 4% T opical Solution. No specimens were taken. A time out was conducted at 09:30, prior to the start of the procedure. A Minimum amount of bleeding was controlled with Pressure. The procedure was tolerated well with a pain level of 0 throughout and a pain level of 0 following the procedure. Post Debridement Measurements: 2.7cm length x 2.5cm width x 0.1cm depth; 0.53cm^3 volume. Character of Wound/Ulcer Post Debridement is improved. Severity of Tissue Post Debridement is: Fat layer exposed. Post procedure Diagnosis Wound #2: Same as Pre-Procedure General Notes: Scribed for Dr Lady Gary by [name] RN. Plan Follow-up Appointments: Return Appointment in 2 weeks. - Dr. Lady Gary rm 2 Bathing/ Shower/ Hygiene:  May shower with protection but do not get wound dressing(s) wet. Protect dressing(s) with water repellant cover (for example, large plastic bag) or a cast cover and may then take shower. Edema Control - Lymphedema / SCD / Other: Elevate legs to the  level of the heart or above for 30 minutes daily and/or when sitting for 3-4 times a day throughout the day. Avoid standing for long periods of time. GILDO, [name] ([id number]) [id number] Page 10 of 13 Moisturize legs daily. Compression stocking or Garment 30-40 mm/Hg pressure to: - left leg daily Off-Loading: Wound #1 Left Calcaneus: Other: - use knee scooter to ambulate, minimal weight bearing right foot The following medication(s) was prescribed: amoxicillin-pot clavulanate oral 875 mg-125 mg tablet 1 tab p.o. twice daily x 6 weeks starting 04/27/2023 doxycycline hyclate oral 100 mg capsule 1 capsule p.o. twice daily x 6 weeks starting 04/27/2023 WOUND #1: - Calcaneus Wound Laterality: Left Cleanser: Soap and Water 1 x Per Day/30 Days Discharge Instructions: May shower and wash wound with dial antibacterial soap and water prior to dressing change. Cleanser: Vashe 5.8 (oz) 1 x Per Day/30 Days Discharge Instructions: Cleanse the wound with Vashe prior to applying a clean dressing using gauze sponges, not tissue or cotton balls. Peri-Wound Care: Zinc Oxide Ointment 30g tube 1 x Per Day/30 Days Discharge Instructions: Apply Zinc Oxide to periwound with each dressing change Topical: keystone 1 x Per Day/30 Days Secondary Dressing: Woven Gauze Sponge, Non-Sterile 4x4 in (Generic) 1 x Per Day/30 Days Discharge Instructions: Wet with Vashe we to dry. Secondary Dressing: Zetuvit Plus 4x8 in (Generic) 1 x Per Day/30 Days Discharge Instructions: Apply over primary dressing as directed. Secured With: Coban Self-Adherent Wrap 4x5 (in/yd) (Generic) 1 x Per Day/30 Days Discharge Instructions: Secure with Coban as directed. Secured With: [employer], 4.5x3.1 (in/yd) (Generic) 1 x Per Day/30 Days Discharge Instructions: Secure with Kerlix as directed. WOUND #2: - Foot Wound Laterality: Dorsal, Left Cleanser: Soap and Water 1 x Per Day/30 Days Discharge Instructions:  May shower and wash wound with dial antibacterial soap and water prior to dressing change. Cleanser: Vashe 5.8 (oz) 1 x Per Day/30 Days Discharge Instructions: Cleanse the wound with Vashe prior to applying a clean dressing using gauze sponges, not tissue or cotton balls. Cleanser: Wound Cleanser (Generic) 1 x Per Day/30 Days Discharge Instructions: Cleanse the wound with wound cleanser prior to applying a clean dressing using gauze sponges, not tissue or cotton balls. Peri-Wound Care: keystone 1 x Per Day/30 Days Prim Dressing: Hydrofera Blue Ready Transfer Foam, 2.5x2.5 (in/in) (Generic) 1 x Per Day/30 Days ary Discharge Instructions: Apply directly to wound bed as directed Prim Dressing: MediHoney Gel, tube 1.5 (oz) 1 x Per Day/30 Days ary Discharge Instructions: Apply to wound bed as instructed Secondary Dressing: Zetuvit Plus Silicone Border Dressing 5x5 (in/in) (Generic) 1 x Per Day/30 Days Discharge Instructions: Apply silicone border over primary dressing as directed. Secured With: Coban Self-Adherent Wrap 4x5 (in/yd) 1 x Per Day/30 Days Discharge Instructions: Secure with Coban as directed. Secured With: [employer], 4.5x3.1 (in/yd) 1 x Per Day/30 Days Discharge Instructions: Secure with Kerlix as directed. Com pression Stockings: Circaid Juxta Lite Compression Wrap Compression Amount: 30-40 mmHg (left) Discharge Instructions: Apply Circaid Juxta Lite Compression Wrap daily as instructed. Apply first thing in the morning, remove at night before bed. 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 The  dorsal foot wound is a little bit smaller and shallower. It is not as irritated as it often looks. There is slough on the surface. I used a curette to debride callus, slough, and skin from the heel ulcer and slough from the dorsal foot wound. We will continue Medihoney and Hydrofera  Blue ready on the dorsal foot and Vashe-moistened gauze dressing changes to the heel. I removed his Augmentin and doxycycline prescriptions for his chronic osteomyelitis of the heel. I do not foresee this wound ever healing, but perhaps we can keep him from becoming septic from the open wound while he comes to terms with his need for below-knee amputation. Electronic Signature(s) Signed: 04/27/2023 9:49:56 AM By: [name] MD FACS Previous Signature: 04/27/2023 9:47:39 AM Version By: [name] MD FACS Entered By: [name] on 04/27/2023 09:49:55 -------------------------------------------------------------------------------- HxROS Details Patient Name: Date of Service: [name]. 04/27/2023 8:45 A M Medical Record Number: [id number] Patient Account Number: [id number] Date of Birth/Sex: Treating RN: 01-29-1956 (67 y.o. M) Primary Care Provider: Foye [name] Other Clinician: Referring Provider: Treating Provider/Extender: Kandis Cocking in Treatment: 32 Eyes Medical History: Positive for: Cataracts Negative for: Glaucoma; Optic Neuritis Ear/Nose/Mouth/Throat Medical HistoryKABEER, HAW ([id number]) [id number] Page 11 of 13 Negative for: Chronic sinus problems/congestion; Middle ear problems Respiratory Medical History: Positive for: Sleep Apnea Cardiovascular Medical History: Positive for: Hypertension Past Medical History Notes: hyperlipidemia Gastrointestinal Medical History: Negative for: Cirrhosis ; Colitis; Crohns; Hepatitis A; Hepatitis B; Hepatitis C Endocrine Medical History: Positive for: Type II Diabetes Time with diabetes: 20 years Treated with: Insulin Blood sugar tested every day: Yes Tested : 2-3 Genitourinary Medical History: Past Medical History Notes: AKI Immunological Medical History: Negative for: Lupus Erythematosus; Raynauds;  Scleroderma Musculoskeletal Medical History: Positive for: Osteomyelitis - 2023 Neurologic Medical History: Positive for: Neuropathy - Bila lower extremities Oncologic Medical History: Negative for: Received Chemotherapy; Received Radiation Psychiatric Medical History: Negative for: Anorexia/bulimia; Confinement Anxiety HBO Extended History Items Eyes: Cataracts Immunizations Pneumococcal Vaccine: Received Pneumococcal Vaccination: Yes Received Pneumococcal Vaccination On or After 60th Birthday: Yes Implantable Devices Yes Hospitalization / Surgery History Type of Hospitalization/Surgery I and D Left calcaneus back surgery- laminectomy eye surgery- Bila cataracts shoulder arthroscopy [name], [name] ([id number]) [id number] Page 12 of 45 Family and Social History Cancer: Yes Scientist, research (physical sciences); Diabetes: Yes - Mother,Father; Heart Disease: No; Hereditary Spherocytosis: No; Hypertension: No; Kidney Disease: No; Lung Disease: Yes - Father; Seizures: No; Stroke: No; Thyroid Problems: Yes - Mother; Tuberculosis: No; Never smoker; Marital Status - Married; Alcohol Use: Never; Drug Use: No History; Caffeine Use: Daily; Financial Concerns: No; Food, Clothing or Shelter Needs: No; Support System Lacking: No; Transportation Concerns: No Electronic Signature(s) Signed: 04/27/2023 11:57:16 AM By: [name] MD FACS Entered By: [name] on 04/27/2023 09:45:27 -------------------------------------------------------------------------------- SuperBill Details Patient Name: Date of Service: [name]. 04/27/2023 Medical Record Number: [id number] Patient Account Number: [id number] Date of Birth/Sex: Treating RN: [birth year] (67 y.o. 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 L97.523 Non-pressure chronic ulcer of other part of left foot with necrosis of 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, left ankle and foot E11.65 Type 2 diabetes mellitus with hyperglycemia E11.621 Type 2 diabetes mellitus with foot ulcer Facility Procedures : The patient participates with Medicare or their insurance follows  the Medicare Facility Guidelines: CPT4 Code Description Modifier Quantity [id number] [phone] - DEBRIDE WOUND 1ST 20 SQ CM OR < 1 ICD-10 Diagnosis Description L97.424 Non-pressure chronic ulcer  of left heel and midfoot with necrosis of bone L97.523 Non-pressure chronic ulcer of other part of left foot with necrosis of muscle : The patient participates with Medicare or their insurance follows the Medicare Facility Guidelines: [id number] - DEBRIDE WOUND EA ADDL 20 SQ CM 1 ICD-10 Diagnosis Description 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 L97.523 Non-pressure chronic ulcer of other part of left foot with necrosis of muscle Physician Procedures : CPT4 Code Description Modifier [id number] - WC PHYS LEVEL 4 - EST PT 25 ICD-10 Diagnosis Description 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, 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 Quantity: 1 : [id number] - WC PHYS DEBR WO ANESTH 20 SQ CM ICD-10 Diagnosis Description 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 Quantity: 1 : [id number] - WC PHYS DEBR WO ANESTH EA ADD 20 CM ICD-10 Diagnosis Description L97.424 Non-pressure chronic ulcer of left heel and midfoot with necrosis of  bone L97.523 Non-pressure chronic ulcer of other part of left foot with necrosis of muscle Quantity: 1 Electronic Signature(s) Signed: 04/27/2023 11:57:16 AM By: [name] MD FACS [name], [name] ([id number]) [id number] Page 13 of 13 Signed: 05/24/2023 7:49:05 AM By: [name] Previous Signature: 04/27/2023 9:50:18 AM Version By: [name] MD FACS Entered By: [name] on 04/27/2023 10:01:42

## 2023-05-24 NOTE — Progress Notes (Incomplete)
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) 02/22/2023   Diabetic foot ulcer (HCC) 02/21/2023   Chronic osteomyelitis (HCC) 02/18/2023   Osteomyelitis of foot (HCC) 09/21/2019   Acute osteomyelitis of left calcaneus (HCC)    Cubital tunnel syndrome 08/29/2018   AKI (acute kidney injury) (HCC) 06/09/2018   DM2 (diabetes mellitus, type 2) (HCC) 06/09/2018   HTN (hypertension) 06/09/2018   Dehydration 06/09/2018   Diabetic peripheral neuropathy (HCC) 09/08/2015   Ulcer of right foot with fat layer exposed (HCC) 09/08/2015   Primary osteoarthritis of left knee 10/23/2014   Tear of medial meniscus of left knee, initial encounter 10/23/2014    Patient's Medications  New Prescriptions   No medications on file  Previous Medications   ASPIRIN EC 81 MG TABLET    Take 81 mg by mouth daily.    ATORVASTATIN (LIPITOR) 10 MG TABLET    Take 10 mg by mouth at bedtime.   B-D UF III MINI PEN NEEDLES 31G X 5 MM MISC    USE AS DIRECTED UP TO 3 TIMES DAILY   CHOLECALCIFEROL (VITAMIN D3) 50 MCG (2000 UT) CAPS    Take 2,000 Units by mouth daily.   CONTINUOUS BLOOD GLUC SENSOR (FREESTYLE LIBRE 14 DAY SENSOR) MISC    Use to check blood glucose   CYANOCOBALAMIN (VITAMIN B12) 1000 MCG TABLET    Take 1,000 mcg by mouth daily.   EPINEPHRINE (EPIPEN 2-PAK) 0.3 MG/0.3 ML IJ SOAJ INJECTION    Inject 0.3 mg into the muscle as needed for anaphylaxis.    FERROUS SULFATE 325 (65 FE) MG TABLET    Take 325 mg by mouth daily with breakfast.   FUROSEMIDE (LASIX) 40 MG TABLET    Take 40 mg by mouth daily.    LOSARTAN (COZAAR) 100 MG TABLET    Take 100 mg by mouth daily.   NOVOLOG FLEXPEN 100 UNIT/ML FLEXPEN    Inject 5-11 Units into the skin 3 (three) times daily with meals. Sliding scale   OXYCODONE (OXY IR/ROXICODONE) 5 MG IMMEDIATE RELEASE TABLET    Take 1 tablet (5 mg total)  by mouth every 4 (four) hours as needed for moderate pain, severe pain or breakthrough pain.   POTASSIUM CHLORIDE (K-DUR) 10 MEQ TABLET    Take 10 mEq by mouth daily.   PROPRANOLOL (INDERAL) 20 MG TABLET    Take 20 mg by mouth 2 (two) times daily.   TRESIBA FLEXTOUCH 200 UNIT/ML SOPN    Inject 50-85 Units into the skin See admin instructions. Take 85 units in the morning and 50 units at bedtime  Modified Medications   No medications on file  Discontinued Medications   No medications on file    Subjective: 67 year old male with PMH of DM2 w neuropathy, HTN, sleep apnea, PVD s/p vascular intervention 02/2023, chronic diabetic left foot ulcer who is referred from vascular Dr. Juanetta Gosling for non surgical management of left heel ulcer.  Patient has been closely following with wound care with Dr.  Lady Gary.  He was discharged from the hospital on 3/13 with doxycycline and Augmentin for 6 weeks course as he declined amputation which he reports has completed as instructed.  He was last seen by Dr. Juanetta Gosling with vascular on 4/30 and was not willing for amputation and hence referred to [place] for nonsurgical management.  He has  been restarted on restarted on doxycycline and Augmentin by Dr. Lady Gary which was prescribed on 5/15 and is taking as instructed.  He was last seen by wound care on 5/15 - left heel wound and dorsal wound appeared to be stable. No exposed bone noted. He denies some intermittent serosanguinous drainage but no foul smell. Denies fevers, chills, sweats. He had vomiting few days ago when he ate outside. Denies nausea, abdominal pain and diarrhea. He has neuropathy in his feet and has no sensation. Denies cough, chest pain and sob. He is not willing for amputation and states that he is the primary care giver for his wife at home as well as dog to care for/  He understands there is a risk for sepsis with his open wound and prolonged antibiotics is not going to heal his wounds.   Review of Systems: all  systems reviewed with pertinent positives and negatives as listed above  Past Medical History:  Diagnosis Date   Diabetic foot ulcers (HCC)    DM2 (diabetes mellitus, type 2) (HCC)    HTN (hypertension)    Sleep apnea    No Cpap   Past Surgical History:  Procedure Laterality Date   ABDOMINAL AORTOGRAM W/LOWER EXTREMITY Left 02/18/2023   Procedure: ABDOMINAL AORTOGRAM W/LOWER EXTREMITY;  Surgeon: Leonie Douglas, MD;  Location: [facility];  Service: Cardiovascular;  Laterality: Left;   BACK SURGERY     Laminectomy-Bilateral   EYE SURGERY     Bilateral cataracts and eye implants   I & D EXTREMITY Left 09/21/2019   Procedure: LEFT PARTIAL CALCANEOUS EXCISION;  Surgeon: Nadara Mustard, MD;  Location: [facility];  Service: Orthopedics;  Laterality: Left;   KNEE ARTHROSCOPY Left    SHOULDER ARTHROSCOPY     Right    Social History   Tobacco Use   Smoking status: Never   Smokeless tobacco: Never  Vaping Use   Vaping Use: Never used  Substance Use Topics   Alcohol use: Yes    Comment: rarely   Drug use: Never    No family history on file.  Allergies  Allergen Reactions   Bee Venom Anaphylaxis, Swelling and Rash         Cefepime Rash    Unclear if due to cefepime    Health Maintenance  Topic Date Due   Medicare Annual Wellness (AWV)  Never done   FOOT EXAM  Never done   OPHTHALMOLOGY EXAM  Never done   Diabetic kidney evaluation - Urine ACR  Never done   Hepatitis C Screening  Never done   DTaP/Tdap/Td (1 - Tdap) Never done   Zoster Vaccines- Shingrix (1 of 2) Never done   Colonoscopy  Never done   COVID-19 Vaccine (3 - Pfizer risk series) 05/21/2020   Pneumonia Vaccine 73+ Years old (1 of 1 - PCV) Never done   INFLUENZA VACCINE  07/14/2023   HEMOGLOBIN A1C  08/25/2023   Diabetic kidney evaluation - eGFR measurement  02/23/2024   HPV VACCINES  Aged Out    Objective: BP 130/68   Pulse 73   Temp 97.7 F (36.5 C) (Temporal)   Wt 250 lb (113.4 kg)   SpO2  97%   BMI 32.10 kg/m    Physical Exam Constitutional:      Appearance: Normal appearance.  HENT:     Head: Normocephalic and atraumatic.      Mouth: Mucous membranes are moist.  Eyes:    Conjunctiva/sclera: Conjunctivae normal.  Pupils: Pupils are equal, round, and bilaterally symmetrical   Cardiovascular:     Rate and Rhythm: Normal rate and regular rhythm.     Heart sounds:   Pulmonary:     Effort: Pulmonary effort is normal.     Breath sounds: Normal breath sounds.   Abdominal:     General: Non distended     Palpations: soft.   Musculoskeletal:        General: Normal range of motion.   Skin:    General: Skin is warm and dry.     Comments: He did not want to open the dressing in left foot as it had special foams applied which is not available at our clinic. Pic of his left heel from his phone taken on    Neurological:     General: grossly non focal     Mental Status: awake, alert and oriented to person, place, and time.   Psychiatric:        Mood and Affect: Mood normal.   Lab Results Lab Results  Component Value Date   WBC 4.2 02/23/2023   HGB 9.7 (L) 02/23/2023   HCT 29.7 (L) 02/23/2023   MCV 84.9 02/23/2023   PLT 210 02/23/2023    Lab Results  Component Value Date   CREATININE 1.21 02/23/2023   BUN 19 02/23/2023   NA 134 (L) 02/23/2023   K 3.9 02/23/2023   CL 104 02/23/2023   CO2 22 02/23/2023    Lab Results  Component Value Date   ALT 12 02/23/2023   AST 11 (L) 02/23/2023   ALKPHOS 60 02/23/2023   BILITOT 0.7 02/23/2023    Lab Results  Component Value Date   CHOL 136 02/19/2023   HDL 24 (L) 02/19/2023   LDLCALC 91 02/19/2023   TRIG 106 02/19/2023   CHOLHDL 5.7 02/19/2023   No results found for: "LABRPR", "RPRTITER" No results found for: "HIV1RNAQUANT", "HIV1RNAVL", "CD4TABS"   Assessment/Plan 67 year old male with PMH of DM2 w neuropathy, HTN, sleep apnea, PVD s/p vascular intervention 02/2023, chronic diabetic left foot ulcer who  is referred from vascular Dr. 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  S/p 6 weeks of Doxycycline and augmentin during March hospital admission, restarted back on doxycycline and augmentin on 5/15 by wound care  Following Vascular, continues to decline amputation Following wound care  Advised that further continuation of antibiotics will  likely not heal wound and carry more risks than benefit and hence, recommended to stop. However, he is at risk of infections with the open wound and to take abtx for skin and soft tissue infection as needed. He understands that he needs to go to ED with any signs of sepsis.   Discussed to fu with wound care Fu with [place] as needed   # Medication management  Discussed to stop abtx  No labs  # DM optimize BG control with PCP   I have personally spent 42 minutes involved in face-to-face and non-face-to-face activities for this patient on the day of the visit. Professional time spent includes the following activities: Preparing to see the patient (review of tests), Obtaining and/or reviewing separately obtained history (admission/discharge record), Performing a medically appropriate examination and/or evaluation ,  Ordering medications/tests/procedures, referring and communicating with other health care professionals, Documenting clinical information in the EMR, Independently interpreting results (not separately reported), Communicating results to the patient/family/caregiver, Counseling and educating the patient/family/caregiver and Care coordination (not separately reported).   Victoriano Lain, MD [facility] 05/24/2023, 10:46 AM

## 2023-05-26 ENCOUNTER — Ambulatory Visit (HOSPITAL_BASED_OUTPATIENT_CLINIC_OR_DEPARTMENT_OTHER): Payer: Medicare Other | Admitting: General Surgery

## 2023-05-27 ENCOUNTER — Encounter: Payer: Medicare Other | Admitting: *Deleted

## 2023-05-27 ENCOUNTER — Encounter: Payer: Self-pay | Admitting: *Deleted

## 2023-05-27 ENCOUNTER — Encounter (HOSPITAL_BASED_OUTPATIENT_CLINIC_OR_DEPARTMENT_OTHER): Payer: Medicare Other | Admitting: General Surgery

## 2023-05-27 DIAGNOSIS — E11621 Type 2 diabetes mellitus with foot ulcer: Secondary | ICD-10-CM | POA: Diagnosis not present

## 2023-05-29 NOTE — Progress Notes (Signed)
LAIDEN, [name] ([id number]) [id number] Page 1 of 9 Visit Report for 05/27/2023 Arrival Information Details Patient Name: Date of Service: [place], [name] 05/27/2023 11:15 A M Medical Record Number: [id number] Patient Account Number: [id number] Date of Birth/Sex: Treating RN: 01-04-1956 (67 y.o. [name], [name] Primary Care [name]: [name] Other Clinician: Referring Gurpreet Mariani: Treating Donnie Panik/Extender: [name] in Treatment: 37 Visit Information History Since Last Visit Added or deleted any medications: No Patient Arrived: Knee Scooter Any new allergies or adverse reactions: No Arrival Time: 11:28 Had a fall or experienced change in No Accompanied By: self activities of daily living that may affect Transfer Assistance: None risk of falls: Patient Identification Verified: Yes Signs or symptoms of abuse/neglect since last visito No Secondary Verification Process Completed: Yes Hospitalized since last visit: No Patient Requires Transmission-Based Precautions: No Implantable device outside of the clinic excluding No Patient Has Alerts: No cellular tissue based products placed in the center since last visit: Has Dressing in Place as Prescribed: Yes Pain Present Now: No Electronic Signature(s) Signed: 05/27/2023 4:22:01 PM By: Zenaida Deed RN, BSN Entered By: Zenaida Deed on 05/27/2023 11:28:52 -------------------------------------------------------------------------------- Encounter Discharge Information Details Patient Name: Date of Service: [place], [name] 05/27/2023 11:15 A M Medical Record Number: [id number] Patient Account Number: [id number] Date of Birth/Sex: Treating RN: 1956-06-11 (67 y.o. [name] Primary Care Raylan Hanton: [name] Other Clinician: Referring Kenise Barraco: Treating [name]/Extender: [name] in Treatment: 37 Encounter Discharge  Information Items Post Procedure Vitals Discharge Condition: Stable Temperature (F): 97.9 Ambulatory Status: Knee Scooter Pulse (bpm): 73 Discharge Destination: Home Respiratory Rate (breaths/min): 18 Transportation: Private Auto Blood Pressure (mmHg): 157/75 Accompanied By: self Schedule Follow-up Appointment: Yes Clinical Summary of Care: Patient Declined Electronic Signature(s) Signed: 05/27/2023 4:22:01 PM By: Zenaida Deed RN, BSN Entered By: Zenaida Deed on 05/27/2023 15:52:53 STYLIANOS, HAYES [name] ([id number]) [id number] Page 2 of 9 -------------------------------------------------------------------------------- Lower Extremity Assessment Details Patient Name: Date of Service: [place], [name] 05/27/2023 11:15 A M Medical Record Number: [id number] Patient Account Number: [id number] Date of Birth/Sex: Treating RN: Oct 21, 1956 (67 y.o. [name] Primary Care Yanelis Osika: [name] Other Clinician: Referring [name]: Treating [name]/Extender: [name] in Treatment: 37 Edema Assessment Assessed: [name]: No] [name]: No] Edema: [Left: Ye] [Right: s] Calf Left: Right: Point of Measurement: From Medial Instep 47 cm Ankle Left: Right: Point of Measurement: From Medial Instep 25 cm Vascular Assessment Pulses: Dorsalis Pedis Palpable: [Left:No] Electronic Signature(s) Signed: 05/27/2023 4:22:01 PM By: Zenaida Deed RN, BSN Entered By: Zenaida Deed on 05/27/2023 11:38:13 -------------------------------------------------------------------------------- Multi Wound Chart Details Patient Name: Date of Service: [place], [name] 05/27/2023 11:15 A M Medical Record Number: [id number] Patient Account Number: [id number] Date of Birth/Sex: Treating RN: May 22, 1956 (67 y.o. [name] Primary Care [name]: [name] Other Clinician: Referring Keelia Graybill: Treating [name]/Extender: [name] in Treatment: 37 Vital Signs Height(in): 74 Capillary Blood Glucose(mg/dl): 536 Weight(lbs): 644 Pulse(bpm): 73 Body Mass Index(BMI): 31.5 Blood Pressure(mmHg): 157/75 Temperature(F): 97.9 Respiratory Rate(breaths/min): 18 [1:Photos:] [N/A:N/A [phone].pdf Page 3 of 9] Left Calcaneus Left, Dorsal Foot N/A Wound Location: Pressure Injury Pressure Injury N/A Wounding Event: Diabetic Wound/Ulcer of the Lower Diabetic Wound/Ulcer of the Lower N/A Primary Etiology: Extremity Extremity Cataracts, Sleep Apnea, Hypertension, Cataracts, Sleep Apnea, Hypertension, N/A Comorbid History: Type II Diabetes, Osteomyelitis, Type II Diabetes, Osteomyelitis, Neuropathy Neuropathy 03/09/2019 07/09/2022 N/A Date Acquired: 63 37 N/A Weeks of Treatment: Open Open N/A  Wound Status: No No N/A Wound Recurrence: 6.9x4.9x0.4 3.7x2.5x0.1 N/A Measurements L x W x D (cm) 26.554 7.265 N/A A (cm) : rea 10.622 0.726 N/A Volume (cm) : -22.90% 22.90% N/A % Reduction in A rea: 50.80% 22.90% N/A % Reduction in Volume: Grade 3 Grade 2 N/A Classification: Large Medium N/A Exudate A mount: Serosanguineous Serosanguineous N/A Exudate Type: red, brown red, brown N/A Exudate Color: Thickened Flat and Intact N/A Wound Margin: Large (67-100%) Medium (34-66%) N/A Granulation A mount: Pink, Pale, Hyper-granulation Red N/A Granulation Quality: Small (1-33%) Medium (34-66%) N/A Necrotic A mount: Fat Layer (Subcutaneous Tissue): Yes Fat Layer (Subcutaneous Tissue): Yes N/A Exposed Structures: Fascia: No Fascia: No Tendon: No Tendon: No Muscle: No Muscle: No Joint: No Joint: No Bone: No Bone: No Small (1-33%) Small (1-33%) N/A Epithelialization: Debridement - Selective/Open Wound Debridement - Selective/Open Wound N/A Debridement: Pre-procedure Verification/Time Out 12:00 12:00 N/A Taken: Callus, Slough Callus, Slough N/A Tissue  Debrided: Skin/Epidermis Non-Viable Tissue N/A Level: 26.54 7.26 N/A Debridement A (sq cm): rea Curette Curette N/A Instrument: Minimum Minimum N/A Bleeding: Pressure Pressure N/A Hemostasis A chieved: Insensate Insensate N/A Procedural Pain: Insensate Insensate N/A Post Procedural Pain: Procedure was tolerated well Procedure was tolerated well N/A Debridement Treatment Response: 6.9x4.9x0.4 3.7x2.5x0.1 N/A Post Debridement Measurements L x W x D (cm) 10.622 0.726 N/A Post Debridement Volume: (cm) Callus: Yes Excoriation: Yes N/A Periwound Skin Texture: Scarring: Yes Maceration: Yes Maceration: Yes N/A Periwound Skin Moisture: Dry/Scaly: No Dry/Scaly: No Erythema: No Rubor: No N/A Periwound Skin Color: Rubor: No No Abnormality No Abnormality N/A Temperature: Debridement N/A N/A Procedures Performed: Treatment Notes Electronic Signature(s) Signed: 05/27/2023 12:38:01 PM By: [name] MD FACS Signed: 05/27/2023 3:21:56 PM By: Samuella Bruin Entered By: [name] on 05/27/2023 12:38:01 -------------------------------------------------------------------------------- Multi-Disciplinary Care Plan Details Patient Name: Date of Service: [place], [name] 05/27/2023 11:15 A M Medical Record Number: [id number] Patient Account Number: [id number] Date of Birth/Sex: Treating RN: [birth year] (67 y.o. [name] Primary Care Christyanna Mckeon: [name] Other Clinician: Referring [name]: Treating [name]/Extender: [name] in Treatment: [id number] [place] [name] ([id number]) [id number] Page 4 of 9 Multidisciplinary Care Plan reviewed with physician Active Inactive Nutrition Nursing Diagnoses: Imbalanced nutrition Impaired glucose control: actual or potential Potential for alteratiion in Nutrition/Potential for imbalanced nutrition Goals: Patient/caregiver will maintain therapeutic glucose  control Date Initiated: 05/27/2023 Target Resolution Date: 06/24/2023 Goal Status: Active Interventions: Assess HgA1c results as ordered upon admission and as needed Assess patient nutrition upon admission and as needed per policy Provide education on elevated blood sugars and impact on wound healing Treatment Activities: Patient referred to Primary Care Physician for further nutritional evaluation : 05/27/2023 Notes: Wound/Skin Impairment Nursing Diagnoses: Impaired tissue integrity Goals: Patient/caregiver will verbalize understanding of skin care regimen Date Initiated: 10/06/2022 Target Resolution Date: 07/13/2023 Goal Status: Active Ulcer/skin breakdown will have a volume reduction of 30% by week 4 Date Initiated: 09/10/2022 Date Inactivated: 10/06/2022 Target Resolution Date: 10/08/2022 Goal Status: Unmet Unmet Reason: osteo, HBOT Ulcer/skin breakdown will have a volume reduction of 50% by week 8 Date Initiated: 10/06/2022 Date Inactivated: 02/14/2023 Target Resolution Date: 01/14/2023 Unmet Reason: insufficient perfusion Goal Status: Unmet to Left lower extremity Ulcer/skin breakdown will have a volume reduction of 80% by week 12 Date Initiated: 02/14/2023 Date Inactivated: 03/14/2023 Target Resolution Date: 03/15/2023 Goal Status: Unmet Unmet Reason: osteomyelitis Interventions: Assess ulceration(s) every visit Provide education on ulcer and skin care Treatment Activities: Consult for HBO : 09/10/2022 Skin care regimen initiated :  09/10/2022 Notes: NPWT started 12/27/22 Electronic Signature(s) Signed: 05/27/2023 4:22:01 PM By: Zenaida Deed RN, BSN Entered By: Zenaida Deed on 05/27/2023 11:46:03 -------------------------------------------------------------------------------- Pain Assessment Details Patient Name: Date of Service: [place], [name] 05/27/2023 11:15 A M Medical Record Number: [id number] Patient Account Number: [id number] Date of Birth/Sex: Treating  RN: [birth year] (67 y.o. [name] Primary Care Kosisochukwu Burningham: [name] Other Clinician: Referring Rickie Gange: Treating Skye Plamondon/Extender: Kayleen Memos [place], Windy Fast [name] ([id number]) [phone].pdf Page 5 of 9 Weeks in Treatment: 37 Active Problems Location of Pain Severity and Description of Pain Patient Has Paino No Site Locations Rate the pain. Current Pain Level: 0 Pain Management and Medication Current Pain Management: Electronic Signature(s) Signed: 05/27/2023 4:22:01 PM By: Zenaida Deed RN, BSN Entered By: Zenaida Deed on 05/27/2023 11:29:34 -------------------------------------------------------------------------------- Patient/Caregiver Education Details Patient Name: Date of Service: [name] 6/14/2024andnbsp11:15 A M Medical Record Number: [id number] Patient Account Number: [id number] Date of Birth/Gender: Treating RN: 13-Feb-1956 (67 y.o. [name] Primary Care Physician: [name] Other Clinician: Referring Physician: Treating Physician/Extender: [name] in Treatment: 37 Education Assessment Education Provided To: Patient Education Topics Provided Elevated Blood Sugar/ Impact on Healing: Methods: Explain/Verbal Responses: Reinforcements needed, State content correctly Offloading: Methods: Explain/Verbal Responses: Reinforcements needed, State content correctly Wound/Skin Impairment: Methods: Explain/Verbal Responses: Reinforcements needed, State content correctly Electronic Signature(s) Signed: 05/27/2023 4:22:01 PM By: Zenaida Deed RN, BSN Entered By: Zenaida Deed on 05/27/2023 11:46:40 RHYSON, TRAUGHBER [name] ([id number]) [id number] Page 6 of 9 -------------------------------------------------------------------------------- Wound Assessment Details Patient Name: Date of Service: [place], [name] 05/27/2023 11:15 A  M Medical Record Number: [id number] Patient Account Number: [id number] Date of Birth/Sex: Treating RN: 1956-06-04 (67 y.o. [name] Primary Care Gaby Harney: [name] Other Clinician: Referring Banessa Mao: Treating [name]/Extender: [name] in Treatment: 37 Wound Status Wound Number: 1 Primary Diabetic Wound/Ulcer of the Lower Extremity Etiology: Wound Location: Left Calcaneus Wound Open Wounding Event: Pressure Injury Status: Date Acquired: 03/09/2019 Comorbid Cataracts, Sleep Apnea, Hypertension, Type II Diabetes, Weeks Of Treatment: 37 History: Osteomyelitis, Neuropathy Clustered Wound: No Photos Wound Measurements Length: (cm) 6.9 Width: (cm) 4.9 Depth: (cm) 0.4 Area: (cm) 26.554 Volume: (cm) 10.622 % Reduction in Area: -22.9% % Reduction in Volume: 50.8% Epithelialization: Small (1-33%) Tunneling: No Undermining: No Wound Description Classification: Grade 3 Wound Margin: Thickened Exudate Amount: Large Exudate Type: Serosanguineous Exudate Color: red, brown Foul Odor After Cleansing: No Slough/Fibrino Yes Wound Bed Granulation Amount: Large (67-100%) Exposed Structure Granulation Quality: Pink, Pale, Hyper-granulation Fascia Exposed: No Necrotic Amount: Small (1-33%) Fat Layer (Subcutaneous Tissue) Exposed: Yes Necrotic Quality: Adherent Slough Tendon Exposed: No Muscle Exposed: No Joint Exposed: No Bone Exposed: No Periwound Skin Texture Texture Color No Abnormalities Noted: No No Abnormalities Noted: Yes Callus: Yes Temperature / Pain Temperature: No Abnormality Moisture No Abnormalities Noted: No Dry / Scaly: No Maceration: Yes Treatment Notes LAFAYETTE, MEECH ([id number]) [id number] Page 7 of 9 Wound #1 (Calcaneus) Wound Laterality: Left Cleanser Soap and Water Discharge Instruction: May shower and wash wound with dial antibacterial soap and water prior to dressing  change. Vashe 5.8 (oz) Discharge Instruction: Cleanse the wound with Vashe prior to applying a clean dressing using gauze sponges, not tissue or cotton balls. Peri-Wound Care Zinc Oxide Ointment 30g tube Discharge Instruction: Apply Zinc Oxide to periwound with each dressing change Topical Gentamicin Discharge Instruction: Apply to wound when keystone not available Mupirocin Ointment Discharge Instruction: when keystone not available apply Mupirocin (Bactroban)  as instructed keystone Primary Dressing Hydrofera Blue Ready Transfer Foam, 4x5 (in/in) Discharge Instruction: Apply to wound bed as instructed Secondary Dressing Woven Gauze Sponge, Non-Sterile 4x4 in Discharge Instruction: Wet with Vashe we to dry. Zetuvit Plus 4x8 in Discharge Instruction: Apply over primary dressing as directed. Secured With [employer] 4x5 (in/yd) Discharge Instruction: Secure with Coban as directed. Kerlix Roll Sterile, 4.5x3.1 (in/yd) Discharge Instruction: Secure with Kerlix as directed. Compression Wrap Compression Stockings Add-Ons Electronic Signature(s) Signed: 05/27/2023 4:22:01 PM By: Zenaida Deed RN, BSN Entered By: Zenaida Deed on 05/27/2023 11:44:33 -------------------------------------------------------------------------------- Wound Assessment Details Patient Name: Date of Service: [place], [name] 05/27/2023 11:15 A M Medical Record Number: [id number] Patient Account Number: [id number] Date of Birth/Sex: Treating RN: 1956/03/29 (67 y.o. [name] Primary Care [name]: [name] Other Clinician: Referring Jazleen Robeck: Treating Lollie Gunner/Extender: [name] in Treatment: 37 Wound Status Wound Number: 2 Primary Diabetic Wound/Ulcer of the Lower Extremity Etiology: Wound Location: Left, Dorsal Foot Wound Open Wounding Event: Pressure Injury Status: Date Acquired: 07/09/2022 Comorbid Cataracts, Sleep Apnea,  Hypertension, Type II Diabetes, Weeks Of Treatment: 37 History: Osteomyelitis, Neuropathy Clustered Wound: No Photos MUJAHID, BARTOLD ([id number]) [id number] Page 8 of 9 Wound Measurements Length: (cm) 3.7 Width: (cm) 2.5 Depth: (cm) 0.1 Area: (cm) 7.265 Volume: (cm) 0.726 % Reduction in Area: 22.9% % Reduction in Volume: 22.9% Epithelialization: Small (1-33%) Tunneling: No Undermining: No Wound Description Classification: Grade 2 Wound Margin: Flat and Intact Exudate Amount: Medium Exudate Type: Serosanguineous Exudate Color: red, brown Foul Odor After Cleansing: No Slough/Fibrino Yes Wound Bed Granulation Amount: Medium (34-66%) Exposed Structure Granulation Quality: Red Fascia Exposed: No Necrotic Amount: Medium (34-66%) Fat Layer (Subcutaneous Tissue) Exposed: Yes Necrotic Quality: Adherent Slough Tendon Exposed: No Muscle Exposed: No Joint Exposed: No Bone Exposed: No Periwound Skin Texture Texture Color No Abnormalities Noted: No No Abnormalities Noted: Yes Excoriation: Yes Temperature / Pain Scarring: Yes Temperature: No Abnormality Moisture No Abnormalities Noted: No Dry / Scaly: No Maceration: Yes Treatment Notes Wound #2 (Foot) Wound Laterality: Dorsal, Left Cleanser Soap and Water Discharge Instruction: May shower and wash wound with dial antibacterial soap and water prior to dressing change. Vashe 5.8 (oz) Discharge Instruction: Cleanse the wound with Vashe prior to applying a clean dressing using gauze sponges, not tissue or cotton balls. Peri-Wound Care Zinc Oxide Ointment 30g tube Discharge Instruction: Apply Zinc Oxide to periwound with each dressing change Topical Gentamicin Discharge Instruction: Apply to wound when keystone not available Mupirocin Ointment Discharge Instruction: when keystone not available apply Mupirocin (Bactroban) as instructed keystone Primary Dressing Hydrofera Blue Ready Transfer Foam,  2.5x2.5 (in/in) Discharge Instruction: Apply directly to wound bed as directed Secondary Dressing Woven Gauze Sponge, Non-Sterile 4x4 in [place] [name] ([id number]) [phone].pdf Page 9 of 9 Discharge Instruction: Wet with Vashe we to dry. Zetuvit Plus 4x8 in Discharge Instruction: Apply over primary dressing as directed. Secured With [employer] 4x5 (in/yd) Discharge Instruction: Secure with Coban as directed. Kerlix Roll Sterile, 4.5x3.1 (in/yd) Discharge Instruction: Secure with Kerlix as directed. Compression Wrap Compression Stockings Add-Ons Electronic Signature(s) Signed: 05/27/2023 4:22:01 PM By: Zenaida Deed RN, BSN Entered By: Zenaida Deed on 05/27/2023 11:45:14 -------------------------------------------------------------------------------- Vitals Details Patient Name: Date of Service: [name], [name] 05/27/2023 11:15 A M Medical Record Number: [id number] Patient Account Number: [id number] Date of Birth/Sex: Treating RN: [birth year] (67 y.o. [name] Primary Care Ludmilla Mcgillis: [name] Other Clinician: Referring Josalyn Dettmann: Treating Shaunie Boehm/Extender: [name] in  Treatment: 37 Vital Signs Time Taken: 11:28 Temperature (F): 97.9 Height (in): 74 Pulse (bpm): 73 Weight (lbs): 245 Respiratory Rate (breaths/min): 18 Body Mass Index (BMI): 31.5 Blood Pressure (mmHg): 157/75 Capillary Blood Glucose (mg/dl): 409 Reference Range: 80 - 120 mg / dl Notes glucose per pt report this am Electronic Signature(s) Signed: 05/27/2023 4:22:01 PM By: Zenaida Deed RN, BSN Entered By: Zenaida Deed on 05/27/2023 11:29:27

## 2023-05-29 NOTE — Progress Notes (Signed)
ZYQUEZ, SCARFONE ([id number]) [id number] Page 1 of 13 Visit Report for 05/27/2023 Chief Complaint Document Details Patient Name: Date of Service: [name]. 05/27/2023 11:15 A M Medical Record Number: [id number] Patient Account Number: [id number] Date of Birth/Sex: Treating RN: 02-08-1956 (67 y.o. [name] Primary Care Provider: Foye [name] Other Clinician: Referring Provider: Treating Provider/Extender: Kandis Cocking in Treatment: 37 Information Obtained from: Patient Chief Complaint Patients presents for treatment of an open diabetic ulcer and evaluation for hyperbaric oxygen therapy Electronic Signature(s) Signed: 05/27/2023 12:39:41 PM By: [name] MD FACS Entered By: [name] on 05/27/2023 12:39:40 -------------------------------------------------------------------------------- Debridement Details Patient Name: Date of Service: [name], [name]. 05/27/2023 11:15 A M Medical Record Number: [id number] Patient Account Number: [id number] Date of Birth/Sex: Treating RN: 1956/11/25 (67 y.o. [name] Primary Care Provider: Foye [name] Other Clinician: Referring Provider: Treating Provider/Extender: Kandis Cocking in Treatment: 37 Debridement Performed for Assessment: Wound #1 Left Calcaneus Performed By: Physician [name], MD Debridement Type: Debridement Severity of Tissue Pre Debridement: Fat layer exposed Level of Consciousness (Pre-procedure): Awake and Alert Pre-procedure Verification/Time Out Yes - 12:00 Taken: Start Time: 12:02 Percent of Wound Bed Debrided: 100% T Area Debrided (cm): otal 26.54 Tissue and other material debrided: Non-Viable, Callus, Slough, Skin: Epidermis, Slough Level: Skin/Epidermis Debridement Description: Selective/Open Wound Instrument: Curette Bleeding: Minimum Hemostasis Achieved: Pressure Procedural  Pain: Insensate Post Procedural Pain: Insensate Response to Treatment: Procedure was tolerated well Level of Consciousness (Post- Awake and Alert procedure): Post Debridement Measurements of Total Wound Length: (cm) 6.9 Width: (cm) 4.9 Depth: (cm) 0.4 Volume: (cm) 10.622 Character of Wound/Ulcer Post Debridement: Stable Severity of Tissue Post Debridement: Fat layer exposed [name], [name] ([id number]) [id number] Page 2 of 13 Post Procedure Diagnosis Same as Pre-procedure Notes Scribed for Dr. Lady Gary by [name] Deed, RN Electronic Signature(s) Signed: 05/27/2023 12:49:31 PM By: [name] MD FACS Signed: 05/27/2023 4:22:01 PM By: [name] Deed RN, BSN Entered By: [name] on 05/27/2023 12:07:32 -------------------------------------------------------------------------------- Debridement Details Patient Name: Date of Service: [name]. 05/27/2023 11:15 A M Medical Record Number: [id number] Patient Account Number: [id number] Date of Birth/Sex: Treating RN: 06-Dec-1956 (67 y.o. [name] Primary Care Provider: Foye [name] Other Clinician: Referring Provider: Treating Provider/Extender: Kandis Cocking in Treatment: 37 Debridement Performed for Assessment: Wound #2 Left,Dorsal Foot Performed By: Physician [name], MD Debridement Type: Debridement Severity of Tissue Pre Debridement: Fat layer exposed Level of Consciousness (Pre-procedure): Awake and Alert Pre-procedure Verification/Time Out Yes - 12:00 Taken: Start Time: 12:02 Pain Control: Lidocaine 5% topical ointment Percent of Wound Bed Debrided: 100% T Area Debrided (cm): otal 7.26 Tissue and other material debrided: Non-Viable, Callus, Slough, Slough Level: Non-Viable Tissue Debridement Description: Selective/Open Wound Instrument: Curette Bleeding: Minimum Hemostasis Achieved: Pressure Procedural Pain: Insensate Post  Procedural Pain: Insensate Response to Treatment: Procedure was tolerated well Level of Consciousness (Post- Awake and Alert procedure): Post Debridement Measurements of Total Wound Length: (cm) 3.7 Width: (cm) 2.5 Depth: (cm) 0.1 Volume: (cm) 0.726 Character of Wound/Ulcer Post Debridement: Stable Severity of Tissue Post Debridement: Fat layer exposed Post Procedure Diagnosis Same as Pre-procedure Electronic Signature(s) Signed: 05/27/2023 12:39:33 PM By: [name] MD FACS Signed: 05/27/2023 3:21:56 PM By: [name] Entered By: [name] on 05/27/2023 12:39:33 [name] ([id number]) [id number] Page 3 of 13 -------------------------------------------------------------------------------- HPI Details Patient Name: Date of Service: [place], [name]. 05/27/2023 11:15 A M Medical Record Number:  [id number] Patient Account Number: [id number] Date of Birth/Sex: Treating RN: 23-Jan-1956 (67 y.o. [name] Primary Care Provider: Foye [name] Other Clinician: Referring Provider: Treating Provider/Extender: Kandis Cocking in Treatment: 37 History of Present Illness HPI Description: ADMISSION 09/10/2022 This is a 67 year old poorly controlled type II diabetic (last hemoglobin A1c 10.8%) who has had an ulcer on his heel for over 3 years. He has been seen in multiple wound care centers, including [facility] and [facility]. He reports that at least 3 doctors have recommended that he undergo below-knee amputation. He most recently met with Dr. Reuel Boom, a vascular surgeon affiliated with [facility]. 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 Unfortunately, the patient has some extenuating social circumstances including the fact that he cares for his wife who has stage IV colon cancer and still works, driving vehicles  for [employer]. 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 He was recently hospitalized at [facility] for sepsis and currently has a PICC line through which he receives IV antibiotics. He reports having had another MRI during that hospital stay along with a chest x-ray and EKG. He apparently contacted one of the hyperbaric therapy techs here and asked a number of questions about hyperbaric oxygen treatments. He subsequently self-referred to our center to undergo further evaluation and management. I mention to him that [facility] actually has hyperbaric chambers, but he states that he lives in [place] and this would be more convenient for him given the intensive nature of the therapy and time requirement. ABI in clinic today was 0.94. The patient actually has 2 wounds. There is a wound on the dorsum of his left foot with heavy black eschar and slough present. After debridement, this was demonstrated to involve the muscle and the extensor tendons are exposed. On his heel, there is essentially a "shark bite" type wound, with much of the heel fat pad absent. The muscle layer is exposed. There is blue-green staining around the perimeter of the wound, but no significant odor. He says he has been applying collagen to the wound on his heel and Silvadene and Betadine to the wound on his dorsal foot. 09/20/2022: The heel wound is quite macerated with wet periwound callus. There is slough accumulation on the surface. The dorsal foot wound looks better this week. There is still exposed tendon, but it is fairly clean with just a little biofilm buildup. We are still working on gathering the required documentation to submit for pretreatment review for hyperbaric oxygen therapy. 09/29/2022: The dorsal foot wound continues to improve. I do not see any exposed tendon at this point. There is just some slough accumulation on the wound surface. He continues to have very wet macerated  periwound callus on his heel. There is slough on the surface, but it is loose and thin. There is an area of undermining at the 11 o'clock position, but the overlying skin and subcutaneous tissue is healthy and viable. He has been approved for hyperbaric oxygen therapy and will start treatment tomorrow. 10/06/2022: Continued contraction and improvement of the dorsal foot wound. There is just a little bit of slough accumulation on the wound surface. The periwound callus continues to accumulate on the heel wound and it is quite macerated. It is also persistently found with blue-green staining present. He did initiate his hyperbaric oxygen therapy, but has had significant difficulty with decompression. He will be  going to ENT to have PE tubes placed. 10/18/2022: His hyperbaric oxygen therapy is on hold while his otological issues are being addressed. He came in again today with the periwound skin on both the dorsal aspect of his foot and his calcaneus completely macerated. The blue-green discoloration, however, has abated and the undermining on the calcaneus has improved. The dorsal foot wound has also contracted somewhat. 10/26/2022: The dorsal foot wound continues to contract and fill with good granulation tissue. The heel, once again, has a rim of macerated callus, but the undermining and tunneling continues to contract. He has completed his oral antibiotics and has been using the [facility] topical compounded antibiotic for his dressing changes at home. He is scheduled to see ENT this afternoon. 11/08/2022: The dorsal foot wound is flush with the surrounding skin and has a good granulation tissue surface. There is some slough accumulation. As usual, the heel has a rim of macerated callus but the dimensions are smaller and the tunneling and undermining have contracted further. He has resumed his hyperbaric oxygen therapy. 12/12; patient seen for wound evaluation. He is tolerating HBO although we could not  dive him yesterday because of relative hypoglycemia and the fact he had given himself NovoLog insulin before he came to clinic. Today his blood sugar is in the 180 range she should be fine. He has a large wound on the plantar calcaneus on the right and a more superficial area on the dorsal foot. He is using a scooter for offloading 11/30/2022: The dorsal foot wound continues to contract. There is some slough on the wound surface. The large calcaneus wound has heaped up wet callus around the margins. Continued undermining. 12/08/2022: The dorsal foot wound is flat and flush with the surrounding skin surface. There is some slough present. Once again, the calcaneal wound has thick, absolutely macerated callus hanging off in tatters around the edges. The patient cannot explain to me why this part of his wound gets so wet. There is slough on the surface. The undermined portion of the wound has filled in. 12/17/2022: Earlier this week, he presented for his hyperbaric oxygen therapy and was hypotensive and ill-appearing. He also had what appeared to be an infected sebaceous cyst on his right upper arm. He was sent to the emergency department. He was given a fluid bolus and the ED provider lanced the cyst. He was prescribed doxycycline. He seems to be feeling better today. He notes that he has had copious drainage from his foot. On further questioning, he states that he has not been taking his oral diuretic. The dorsal foot wound has expanded somewhat, but is actually more superficial. The plantar heel wound is about the same. 12/27/2022: The dorsal foot wound has epithelialized further. The plantar foot wound is about the same size but has heaped up macerated callus around the perimeter. The intake nurse noted a tiny fragment of bone on his dressings and there is now an area at about the 5 o'clock position where 1 can palpate bone through a small slit in the soft tissues. The wound on his arm is contracting but  he still likely has cyst wall present due to the manner in which the infected sebaceous cyst was addressed in the ER. 01/01/2023: The wound on his shoulder has contracted considerably. The dorsal foot wound has some slough on the surface but also looks to be improving. The plantar foot wound is basically unchanged. 01/10/2023: The wound on his shoulder continues to diminish in size and  depth. The dorsal foot wound is improving with just a light layer of slough on the surface. The plantar foot wound appears to be contracting but still has thick wet callus around the perimeter. We did in chamber T cPO2 monitoring during his last hyperbaric oxygen therapy. The results show that he has extremely poor tissue perfusion at room air and 1 atm of pressure. He does respond extremely well to hyperbaric oxygen therapy, however. 01/17/2023: The wound on his shoulder is down to 0.6 cm in depth, down from 1 cm last week. The dorsal foot wound is quite a bit smaller this week with just some light slough on the surface. The plantar foot wound is also smaller and the area of bone that was exposed at the posterior heel is now covered. There is still substantial wet callus around the perimeter. He is scheduled to undergo formal vascular studies sometime next week. [name], [name] ([id number]) [id number] Page 4 of 13 01/24/2023: The wound on his shoulder has almost completely filled in, with just a little bit of depth remaining. The dorsal foot wound got macerated; it appears that his home health nurses are applying the drape for his heel wound over the top of this, meaning that the dorsal foot wound is not getting changed as regularly as it is supposed to. There is some slough on the surface. The plantar foot wound has thick wet callus around the perimeter. The dimensions measure a little bit smaller. He is having his vascular studies done this afternoon. 02/07/2023: The wound on his shoulder is  healed. The dorsal foot wound continues to be subjected to excessive moisture due to the home health nurses applying the drape from his wound VAC over the top of it. It does measure smaller, however. There is more bone exposed on his calcaneus, unfortunately. He has heaped up wet callus around the edges of the heel wound again. He is going to undergo angiography with Dr. Lenell Antu on Friday. Hopefully there will be intervention available to him that may aid in his wound healing. 02/14/2023: He did not get his angiogram last week with Dr. Lenell Antu because he did not have a ride home. The dorsal foot wound looks a little bit better with just a light layer of slough. Unfortunately, there is more bone exposed at the calcaneus and I am concerned that we are nearing a point at which his leg will not be salvageable. 3/18; patient presents for follow-up. He was recently hospitalized on 3/11 for sepsis secondary to diabetic foot wound. He was dishcharged with Augmentin and doxycycline and plan is for 6 weeks of this. He currently has a wound VAC. Wound has declined in appearance. Amputation was recommended in the hospital however patient is not ready for this. Patient states he would like to stop HBO. He currently denies systemic signs of infection. 03/07/2023: The calcaneal wound continues to deteriorate. There is significant undermining around the posterior aspect of the calcaneus and bone is palpable all the way around. The dorsal foot wound has some leathery eschar and slough present. 03/14/2023: The dorsal foot wound is somewhat leathery with some dark eschar and slough present. The calcaneal wound is deteriorating further. There are fragments of the bone hanging loose. He does have an appointment with Dr. Lenell Antu next week on Tuesday to discuss amputation. 04/04/2023: The heel ulcer is larger, but shallower. There is heaped up with callus around the edges. The dorsal foot wound is stable. He has appointment with  Dr. Lenell Antu was rescheduled  for next Tuesday. He is working on arrangements for his dog and his wife to facilitate below-knee amputation. 04/12/2023: The heel ulcer looks about the same. Bone is not exposed. There is moist callus heaped up around the edges. The dorsal foot wound is stable. He will be seeing Dr. Lenell Antu this afternoon to discuss amputation. 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 The dorsal foot wound is a little bit smaller and shallower. It is not as irritated as it often looks. There is slough on the surface. He met with Dr. Lenell Antu and is still not ready to pursue amputation at this time. 05/16/2023: The dorsal foot wound has gotten larger and there appears to be a lot of drainage resulting in periwound tissue maceration. The calcaneal wound also looks similar, with heaped up macerated callus around the edges and a layer of slough on the surface. 05/27/2023: No real change to his wounds. He is still avoiding amputation. Electronic Signature(s) Signed: 05/27/2023 12:40:30 PM By: [name] MD FACS Entered By: [name] on 05/27/2023 12:40:30 -------------------------------------------------------------------------------- Physical Exam Details Patient Name: Date of Service: [place], [name]. 05/27/2023 11:15 A M Medical Record Number: [id number] Patient Account Number: [id number] Date of Birth/Sex: Treating RN: [birth year] (67 y.o. [name] Primary Care Provider: Foye [name] Other Clinician: Referring Provider: Treating Provider/Extender: Kandis Cocking in Treatment: 37 Constitutional Hypertensive, asymptomatic. . . . no acute distress. Respiratory Normal work of breathing on room air. Notes 05/27/2023: No real change to his wounds. He has the usual accumulation of macerated callus around his heel with slough on the wound  surface. The dorsal foot wound continues to have periwound tissue maceration and slough on the surface. Electronic Signature(s) Signed: 05/27/2023 12:41:38 PM By: [name] MD FACS Entered By: [name] on 05/27/2023 12:41:38 [name], [name] ([id number]) [id number] Page 5 of 13 -------------------------------------------------------------------------------- Physician Orders Details Patient Name: Date of Service: [place], [name]. 05/27/2023 11:15 A M Medical Record Number: [id number] Patient Account Number: [id number] Date of Birth/Sex: Treating RN: 01-18-1956 (67 y.o. 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 - Dr. Lady Gary Room 2 Return A Anesthetic Wound #1 Left Calcaneus (In clinic) Topical Lidocaine 4% applied to wound bed Wound #2 Left,Dorsal Foot (In clinic) Topical Lidocaine 4% applied to wound bed Bathing/ Shower/ Hygiene May shower and wash wound with soap and water. - use antibacterial soap Edema Control - Lymphedema / SCD / Other Left Lower Extremity Elevate legs to the level of the heart or above for 30 minutes daily and/or when sitting for 3-4 times a day throughout the day. Avoid standing for long periods of time. Moisturize legs daily. Compression stocking or Garment 30-40 mm/Hg pressure to: - left leg daily Off-Loading Wound #1 Left  Calcaneus Other: - use knee scooter to ambulate, minimal weight bearing right foot Wound Treatment Wound #1 - Calcaneus Wound Laterality: Left Cleanser: Soap and Water  1 x Per Day/30 Days Discharge Instructions: May shower and wash wound with dial antibacterial soap and water prior to dressing change. Cleanser: Vashe 5.8 (oz) 1 x Per Day/30 Days Discharge Instructions: Cleanse the wound with Vashe prior to applying a clean dressing using gauze sponges, not tissue or cotton balls. Peri-Wound Care: Zinc Oxide Ointment 30g tube 1 x Per Day/30 Days Discharge Instructions: Apply Zinc Oxide to periwound with each dressing change Topical: Gentamicin 1 x Per Day/30 Days Discharge Instructions: Apply to wound when keystone not available Topical: Mupirocin Ointment 1 x Per Day/30 Days Discharge Instructions: when keystone not available apply Mupirocin (Bactroban) as instructed Topical: keystone 1 x Per Day/30 Days Prim Dressing: Hydrofera Blue Ready Transfer Foam, 4x5 (in/in) 1 x Per Day/30 Days ary Discharge Instructions: Apply to wound bed as instructed Secondary Dressing: Woven Gauze Sponge, Non-Sterile 4x4 in (Generic) 1 x Per Day/30 Days Discharge Instructions: Wet with Vashe we to dry. BARAN, KLEMZ ([id number]) [id number] Page 6 of 13 Secondary Dressing: Zetuvit Plus 4x8 in (Generic) 1 x Per Day/30 Days Discharge Instructions: Apply over primary dressing as directed. Secured With: Coban Self-Adherent Wrap 4x5 (in/yd) (Generic) 1 x Per Day/30 Days Discharge Instructions: Secure with Coban as directed. Secured With: [employer], 4.5x3.1 (in/yd) (Generic) 1 x Per Day/30 Days Discharge Instructions: Secure with Kerlix as directed. Wound #2 - Foot Wound Laterality: Dorsal, Left Cleanser: Soap and Water 1 x Per Day/30 Days Discharge Instructions: May shower and wash wound with dial antibacterial soap and water prior to dressing change. Cleanser: Vashe 5.8  (oz) 1 x Per Day/30 Days Discharge Instructions: Cleanse the wound with Vashe prior to applying a clean dressing using gauze sponges, not tissue or cotton balls. Peri-Wound Care: Zinc Oxide Ointment 30g tube 1 x Per Day/30 Days Discharge Instructions: Apply Zinc Oxide to periwound with each dressing change Topical: Gentamicin 1 x Per Day/30 Days Discharge Instructions: Apply to wound when keystone not available Topical: Mupirocin Ointment 1 x Per Day/30 Days Discharge Instructions: when keystone not available apply Mupirocin (Bactroban) as instructed Topical: keystone 1 x Per Day/30 Days Prim Dressing: Hydrofera Blue Ready Transfer Foam, 2.5x2.5 (in/in) 1 x Per Day/30 Days ary Discharge Instructions: Apply directly to wound bed as directed Secondary Dressing: Woven Gauze Sponge, Non-Sterile 4x4 in (Generic) 1 x Per Day/30 Days Discharge Instructions: Wet with Vashe we to dry. Secondary Dressing: Zetuvit Plus 4x8 in (Generic) 1 x Per Day/30 Days Discharge Instructions: Apply over primary dressing as directed. Secured With: Coban Self-Adherent Wrap 4x5 (in/yd) (Generic) 1 x Per Day/30 Days Discharge Instructions: Secure with Coban as directed. Secured With: [employer], 4.5x3.1 (in/yd) (Generic) 1 x Per Day/30 Days Discharge Instructions: Secure with Kerlix as directed. Electronic Signature(s) Signed: 05/27/2023 12:49:31 PM By: [name] MD FACS Entered By: [name] on 05/27/2023 12:42:05 -------------------------------------------------------------------------------- Problem List Details Patient Name: Date of Service: [place], [name] 05/27/2023 11:15 A M Medical Record Number: [id number] Patient Account Number: [id number] Date of Birth/Sex: Treating RN: Dec 28, 1955 (67 y.o. 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 09/10/2022 No Yes L97.523 Non-pressure chronic ulcer of other part of left foot with necrosis of muscle 09/10/2022 No Yes KELSEY, CALVI ([id number]) [phone].pdf Page 7 of 13 [id number] Other chronic osteomyelitis, left ankle  and foot 09/10/2022 No Yes E11.65 Type 2 diabetes mellitus with hyperglycemia 09/10/2022 No Yes E11.621 Type 2 diabetes mellitus with foot ulcer 09/10/2022 No Yes Inactive Problems ICD-10 Code Description Active Date Inactive Date L72.3 Sebaceous cyst 12/17/2022 12/17/2022 Resolved Problems ICD-10 Code Description Active Date Resolved Date L98.492 Non-pressure chronic ulcer of skin of other sites with fat layer exposed 12/17/2022 01/03/2023 Electronic Signature(s) Signed: 05/27/2023 12:36:46 PM By: [name] MD FACS Entered By: [name] on 05/27/2023 12:36:46 -------------------------------------------------------------------------------- Progress Note Details Patient Name: Date of Service: [name], [name]. 05/27/2023 11:15 A M Medical Record Number: [id number] Patient Account Number: [id number] Date of Birth/Sex: Treating RN: May 21, 1956 (67 y.o. [name] Primary Care Provider: Foye [name] Other Clinician: Referring Provider: Treating Provider/Extender: Kandis Cocking in Treatment: 37 Subjective Chief Complaint Information obtained from Patient Patients presents for treatment of an open diabetic ulcer and evaluation for hyperbaric oxygen therapy History of Present Illness (HPI) ADMISSION 09/10/2022 This is a 67 year old poorly controlled type II diabetic (last hemoglobin A1c 10.8%) who has had an ulcer on his heel for over 3 years. He has been seen in multiple wound care centers, including [facility] and [facility]. He  reports that at least 3 doctors have recommended that he undergo below-knee amputation. He most recently met with Dr. Reuel Boom, a vascular surgeon affiliated with [facility]. 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 Unfortunately, the patient has some extenuating social circumstances including the fact that he cares for his wife who has stage IV colon cancer and still works, driving vehicles for [employer]. 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 He was recently hospitalized at [facility] for sepsis and currently has a PICC line through which he receives IV antibiotics. He reports having had another MRI during that hospital stay along with a chest x-ray and EKG. He apparently contacted one of the hyperbaric therapy techs here and asked a number of questions about hyperbaric oxygen treatments. He subsequently self-referred to our center to undergo further evaluation and management. I mention to him that [facility] actually has hyperbaric chambers, but he states that he lives in [place] and this would be more convenient for him given the intensive nature of the therapy and time requirement. ABI in clinic today was 0.94. The patient actually has 2 wounds. There is a wound on the dorsum of his left foot with heavy black eschar and slough present. After debridement, this was demonstrated to involve the muscle and the extensor tendons are exposed. On his heel, there is essentially a "shark bite" type wound, with much of the heel fat pad absent. The muscle layer is exposed. There is blue-green staining around the perimeter of the wound, but no significant odor. He says he has been applying collagen to the wound on his heel and Silvadene and Betadine to the wound on his dorsal foot. ALTO, MESMER ([id number]) [id number] Page 8 of 13 09/20/2022: The heel wound is quite  macerated with wet periwound callus. There is slough accumulation on the surface. The dorsal foot wound looks better this week. There is still exposed tendon, but it is fairly clean with just a little biofilm buildup. We are still working on gathering the required documentation to submit for pretreatment review for hyperbaric oxygen therapy. 09/29/2022: The dorsal foot wound continues to improve. I do not see any exposed tendon at this point.  There is just some slough accumulation on the wound surface. He continues to have very wet macerated periwound callus on his heel. There is slough on the surface, but it is loose and thin. There is an area of undermining at the 11 o'clock position, but the overlying skin and subcutaneous tissue is healthy and viable. He has been approved for hyperbaric oxygen therapy and will start treatment tomorrow. 10/06/2022: Continued contraction and improvement of the dorsal foot wound. There is just a little bit of slough accumulation on the wound surface. The periwound callus continues to accumulate on the heel wound and it is quite macerated. It is also persistently found with blue-green staining present. He did initiate his hyperbaric oxygen therapy, but has had significant difficulty with decompression. He will be going to ENT to have PE tubes placed. 10/18/2022: His hyperbaric oxygen therapy is on hold while his otological issues are being addressed. He came in again today with the periwound skin on both the dorsal aspect of his foot and his calcaneus completely macerated. The blue-green discoloration, however, has abated and the undermining on the calcaneus has improved. The dorsal foot wound has also contracted somewhat. 10/26/2022: The dorsal foot wound continues to contract and fill with good granulation tissue. The heel, once again, has a rim of macerated callus, but the undermining and tunneling continues to contract. He has completed his oral antibiotics and has  been using the [facility] topical compounded antibiotic for his dressing changes at home. He is scheduled to see ENT this afternoon. 11/08/2022: The dorsal foot wound is flush with the surrounding skin and has a good granulation tissue surface. There is some slough accumulation. As usual, the heel has a rim of macerated callus but the dimensions are smaller and the tunneling and undermining have contracted further. He has resumed his hyperbaric oxygen therapy. 12/12; patient seen for wound evaluation. He is tolerating HBO although we could not dive him yesterday because of relative hypoglycemia and the fact he had given himself NovoLog insulin before he came to clinic. Today his blood sugar is in the 180 range she should be fine. He has a large wound on the plantar calcaneus on the right and a more superficial area on the dorsal foot. He is using a scooter for offloading 11/30/2022: The dorsal foot wound continues to contract. There is some slough on the wound surface. The large calcaneus wound has heaped up wet callus around the margins. Continued undermining. 12/08/2022: The dorsal foot wound is flat and flush with the surrounding skin surface. There is some slough present. Once again, the calcaneal wound has thick, absolutely macerated callus hanging off in tatters around the edges. The patient cannot explain to me why this part of his wound gets so wet. There is slough on the surface. The undermined portion of the wound has filled in. 12/17/2022: Earlier this week, he presented for his hyperbaric oxygen therapy and was hypotensive and ill-appearing. He also had what appeared to be an infected sebaceous cyst on his right upper arm. He was sent to the emergency department. He was given a fluid bolus and the ED provider lanced the cyst. He was prescribed doxycycline. He seems to be feeling better today. He notes that he has had copious drainage from his foot. On further questioning, he states that he  has not been taking his oral diuretic. The dorsal foot wound has expanded somewhat, but is actually more superficial. The plantar heel wound is about the same. 12/27/2022: The dorsal foot  wound has epithelialized further. The plantar foot wound is about the same size but has heaped up macerated callus around the perimeter. The intake nurse noted a tiny fragment of bone on his dressings and there is now an area at about the 5 o'clock position where 1 can palpate bone through a small slit in the soft tissues. The wound on his arm is contracting but he still likely has cyst wall present due to the manner in which the infected sebaceous cyst was addressed in the ER. 01/01/2023: The wound on his shoulder has contracted considerably. The dorsal foot wound has some slough on the surface but also looks to be improving. The plantar foot wound is basically unchanged. 01/10/2023: The wound on his shoulder continues to diminish in size and depth. The dorsal foot wound is improving with just a light layer of slough on the surface. The plantar foot wound appears to be contracting but still has thick wet callus around the perimeter. We did in chamber T cPO2 monitoring during his last hyperbaric oxygen therapy. The results show that he has extremely poor tissue perfusion at room air and 1 atm of pressure. He does respond extremely well to hyperbaric oxygen therapy, however. 01/17/2023: The wound on his shoulder is down to 0.6 cm in depth, down from 1 cm last week. The dorsal foot wound is quite a bit smaller this week with just some light slough on the surface. The plantar foot wound is also smaller and the area of bone that was exposed at the posterior heel is now covered. There is still substantial wet callus around the perimeter. He is scheduled to undergo formal vascular studies sometime next week. 01/24/2023: The wound on his shoulder has almost completely filled in, with just a little bit of depth remaining. The  dorsal foot wound got macerated; it appears that his home health nurses are applying the drape for his heel wound over the top of this, meaning that the dorsal foot wound is not getting changed as regularly as it is supposed to. There is some slough on the surface. The plantar foot wound has thick wet callus around the perimeter. The dimensions measure a little bit smaller. He is having his vascular studies done this afternoon. 02/07/2023: The wound on his shoulder is healed. The dorsal foot wound continues to be subjected to excessive moisture due to the home health nurses applying the drape from his wound VAC over the top of it. It does measure smaller, however. There is more bone exposed on his calcaneus, unfortunately. He has heaped up wet callus around the edges of the heel wound again. He is going to undergo angiography with Dr. Lenell Antu on Friday. Hopefully there will be intervention available to him that may aid in his wound healing. 02/14/2023: He did not get his angiogram last week with Dr. Lenell Antu because he did not have a ride home. The dorsal foot wound looks a little bit better with just a light layer of slough. Unfortunately, there is more bone exposed at the calcaneus and I am concerned that we are nearing a point at which his leg will not be salvageable. 3/18; patient presents for follow-up. He was recently hospitalized on 3/11 for sepsis secondary to diabetic foot wound. He was dishcharged with Augmentin and doxycycline and plan is for 6 weeks of this. He currently has a wound VAC. Wound has declined in appearance. Amputation was recommended in the hospital however patient is not ready for this. Patient states he would  like to stop HBO. He currently denies systemic signs of infection. 03/07/2023: The calcaneal wound continues to deteriorate. There is significant undermining around the posterior aspect of the calcaneus and bone is palpable all the way around. The dorsal foot wound has some  leathery eschar and slough present. 03/14/2023: The dorsal foot wound is somewhat leathery with some dark eschar and slough present. The calcaneal wound is deteriorating further. There are fragments of the bone hanging loose. He does have an appointment with Dr. Lenell Antu next week on Tuesday to discuss amputation. 04/04/2023: The heel ulcer is larger, but shallower. There is heaped up with callus around the edges. The dorsal foot wound is stable. He has appointment with Dr. Lenell Antu was rescheduled for next Tuesday. He is working on arrangements for his dog and his wife to facilitate below-knee amputation. 04/12/2023: The heel ulcer looks about the same. Bone is not exposed. There is moist callus heaped up around the edges. The dorsal foot wound is stable. He will be seeing Dr. Lenell Antu this afternoon to discuss amputation. 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 The dorsal foot [name], [name] ([id number]) [id number] Page 9 of 13 wound is a little bit smaller and shallower. It is not as irritated as it often looks. There is slough on the surface. He met with Dr. Lenell Antu and is still not ready to pursue amputation at this time. 05/16/2023: The dorsal foot wound has gotten larger and there appears to be a lot of drainage resulting in periwound tissue maceration. The calcaneal wound also looks similar, with heaped up macerated callus around the edges and a layer of slough on the surface. 05/27/2023: No real change to his wounds. He is still avoiding amputation. Patient History Family History Cancer - Father,Mother, Diabetes - Mother,Father, Lung Disease - Father, Thyroid Problems - Mother, No family history of Heart Disease, Hereditary Spherocytosis, Hypertension, Kidney Disease, Seizures, Stroke, Tuberculosis. Social History Never smoker, Marital Status - Married, Alcohol Use - Never,  Drug Use - No History, Caffeine Use - Daily. Medical History Eyes Patient has history of Cataracts Denies history of Glaucoma, Optic Neuritis Ear/Nose/Mouth/Throat Denies history of Chronic sinus problems/congestion, Middle ear problems Respiratory Patient has history of Sleep Apnea Cardiovascular Patient has history of Hypertension Gastrointestinal Denies history of Cirrhosis , Colitis, Crohns, Hepatitis A, Hepatitis B, Hepatitis C Endocrine Patient has history of Type II Diabetes Immunological Denies history of Lupus Erythematosus, Raynauds, Scleroderma Musculoskeletal Patient has history of Osteomyelitis - 2023 Neurologic Patient has history of Neuropathy - Bila lower extremities Oncologic Denies history of Received Chemotherapy, Received Radiation Psychiatric Denies history of Anorexia/bulimia, Confinement Anxiety Hospitalization/Surgery History - I and D Left calcaneus. - back surgery- laminectomy. - eye surgery- Bila cataracts. - shoulder arthroscopy. Medical A Surgical History Notes nd Cardiovascular hyperlipidemia Genitourinary AKI Objective Constitutional Hypertensive, asymptomatic. no acute distress. Vitals Time Taken: 11:28 AM, Height: 74 in, Weight: 245 lbs, BMI: 31.5, Temperature: 97.9 F, Pulse: 73 bpm, Respiratory Rate: 18 breaths/min, Blood Pressure: 157/75 mmHg, Capillary Blood Glucose: 156 mg/dl. General Notes: glucose per pt report this am Respiratory Normal work of breathing on room air. General Notes: 05/27/2023: No real change to his wounds. He has the usual accumulation of macerated callus around his heel with slough on the wound surface. The dorsal foot wound continues to have periwound tissue maceration and slough on the surface. Integumentary (Hair, Skin) Wound #1 status is Open.  Original cause of wound was Pressure Injury. The date acquired was: 03/09/2019. The wound has been in treatment 37 weeks. The wound is located on the Left Calcaneus. The  wound measures 6.9cm length x 4.9cm width x 0.4cm depth; 26.554cm^2 area and 10.622cm^3 volume. There is Fat Layer (Subcutaneous Tissue) exposed. There is no tunneling or undermining noted. There is a large amount of serosanguineous drainage noted. The wound margin is thickened. There is large (67-100%) pink, pale, hyper - granulation within the wound bed. There is a small (1-33%) amount of necrotic tissue within the wound bed including Adherent Slough. The periwound skin appearance had no abnormalities noted for color. The periwound skin appearance exhibited: Callus, Maceration. The periwound skin appearance did not exhibit: Dry/Scaly. Periwound temperature was noted as No Abnormality. Wound #2 status is Open. Original cause of wound was Pressure Injury. The date acquired was: 07/09/2022. The wound has been in treatment 37 weeks. The wound is located on the Left,Dorsal Foot. The wound measures 3.7cm length x 2.5cm width x 0.1cm depth; 7.265cm^2 area and 0.726cm^3 volume. There is Fat Layer (Subcutaneous Tissue) exposed. There is no tunneling or undermining noted. There is a medium amount of serosanguineous drainage noted. The wound margin is flat and intact. There is medium (34-66%) red granulation within the wound bed. There is a medium (34-66%) amount of necrotic tissue within the wound bed including Adherent Slough. The periwound skin appearance had no abnormalities noted for color. The periwound skin appearance exhibited: Excoriation, Scarring, Maceration. The periwound skin appearance did not exhibit: Dry/Scaly. Periwound temperature was noted as No Abnormality. 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 There was a Selective/Open Wound Skin/Epidermis Debridement with a total area of 26.54 sq cm performed by [name], MD. With the following instrument(s): Curette to remove Non-Viable tissue/material. Material removed includes Callus, Slough, and Skin: Epidermis. No specimens were taken. A time out was conducted at 12:00, prior to the start of the procedure. A Minimum amount of bleeding was controlled with Pressure. The procedure was tolerated well with a pain level of Insensate throughout and a pain level of Insensate following the procedure. Post Debridement Measurements: 6.9cm length x 4.9cm width x 0.4cm depth; 10.622cm^3 volume. Character of Wound/Ulcer Post Debridement is stable. Severity of Tissue Post Debridement is: Fat layer exposed. Post procedure Diagnosis Wound #1: Same as Pre-Procedure General Notes: Scribed for Dr. Lady Gary by [name] Deed, RN. 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 There was a Selective/Open Wound Non-Viable Tissue Debridement with a total area of 7.26 sq cm performed by [name], MD. With the following instrument(s): Curette to remove Non-Viable tissue/material. Material removed includes Callus and Slough and after achieving pain control using Lidocaine 5% topical ointment. No specimens were taken. A time out was conducted at 12:00, prior to the start of the procedure. A Minimum amount of bleeding was controlled with Pressure. The procedure was tolerated well  with a pain level of Insensate throughout and a pain level of Insensate following  the procedure. Post Debridement Measurements: 3.7cm length x 2.5cm width x 0.1cm depth; 0.726cm^3 volume. Character of Wound/Ulcer Post Debridement is stable. Severity of Tissue Post Debridement is: Fat layer exposed. Post procedure Diagnosis Wound #2: Same as Pre-Procedure Plan Follow-up Appointments: Return Appointment in 2 weeks. - Dr. Lady Gary Room 2 Anesthetic: Wound #1 Left Calcaneus: (In clinic) Topical Lidocaine 4% applied to wound bed Wound #2 Left,Dorsal Foot: (In clinic) Topical Lidocaine 4% applied to wound bed Bathing/ Shower/ Hygiene: May shower and wash wound with soap and water. - use antibacterial soap Edema Control - Lymphedema / SCD / Other: Elevate legs to the level of the heart or above for 30 minutes daily and/or when sitting for 3-4 times a day throughout the day. Avoid standing for long periods of time. Moisturize legs daily. Compression stocking or Garment 30-40 mm/Hg pressure to: - left leg daily Off-Loading: Wound #1 Left Calcaneus: Other: - use knee scooter to ambulate, minimal weight bearing right foot WOUND #1: - Calcaneus Wound Laterality: Left Cleanser: Soap and Water 1 x Per Day/30 Days Discharge Instructions: May shower and wash wound with dial antibacterial soap and water prior to dressing change. Cleanser: Vashe 5.8 (oz) 1 x Per Day/30 Days Discharge Instructions: Cleanse the wound with Vashe prior to applying a clean dressing using gauze sponges, not tissue or cotton balls. Peri-Wound Care: Zinc Oxide Ointment 30g tube 1 x Per Day/30 Days Discharge Instructions: Apply Zinc Oxide to periwound with each dressing change Topical: Gentamicin 1 x Per Day/30 Days Discharge Instructions: Apply to wound when keystone not available Topical: Mupirocin Ointment 1 x Per Day/30 Days Discharge Instructions: when keystone not available apply Mupirocin (Bactroban) as instructed Topical: keystone 1 x Per Day/30 Days Prim Dressing: Hydrofera Blue Ready Transfer Foam,  4x5 (in/in) 1 x Per Day/30 Days ary Discharge Instructions: Apply to wound bed as instructed Secondary Dressing: Woven Gauze Sponge, Non-Sterile 4x4 in (Generic) 1 x Per Day/30 Days Discharge Instructions: Wet with Vashe we to dry. Secondary Dressing: Zetuvit Plus 4x8 in (Generic) 1 x Per Day/30 Days Discharge Instructions: Apply over primary dressing as directed. Secured With: Coban Self-Adherent Wrap 4x5 (in/yd) (Generic) 1 x Per Day/30 Days Discharge Instructions: Secure with Coban as directed. Secured With: [employer], 4.5x3.1 (in/yd) (Generic) 1 x Per Day/30 Days [name], [name] ([id number]) [id number].pdf Page 11 of 13 Discharge Instructions: Secure with Kerlix as directed. WOUND #2: - Foot Wound Laterality: Dorsal, Left Cleanser: Soap and Water 1 x Per Day/30 Days Discharge Instructions: May shower and wash wound with dial antibacterial soap and water prior to dressing change. Cleanser: Vashe 5.8 (oz) 1 x Per Day/30 Days Discharge Instructions: Cleanse the wound with Vashe prior to applying a clean dressing using gauze sponges, not tissue or cotton balls. Peri-Wound Care: Zinc Oxide Ointment 30g tube 1 x Per Day/30 Days Discharge Instructions: Apply Zinc Oxide to periwound with each dressing change Topical: Gentamicin 1 x Per Day/30 Days Discharge Instructions: Apply to wound when keystone not available Topical: Mupirocin Ointment 1 x Per Day/30 Days Discharge Instructions: when keystone not available apply Mupirocin (Bactroban) as instructed Topical: keystone 1 x Per Day/30 Days Prim Dressing: Hydrofera Blue Ready Transfer Foam, 2.5x2.5 (in/in) 1 x Per Day/30 Days ary Discharge Instructions: Apply directly to wound bed as directed Secondary Dressing: Woven Gauze Sponge, Non-Sterile 4x4 in (Generic) 1 x Per Day/30 Days Discharge Instructions: Wet with Vashe we to dry.  Secondary Dressing: Zetuvit Plus 4x8 in (Generic) 1 x Per Day/30 Days Discharge  Instructions: Apply over primary dressing as directed. Secured With: Coban Self-Adherent Wrap 4x5 (in/yd) (Generic) 1 x Per Day/30 Days Discharge Instructions: Secure with Coban as directed. Secured With: [employer], 4.5x3.1 (in/yd) (Generic) 1 x Per Day/30 Days Discharge Instructions: Secure with Kerlix as directed. 05/27/2023: No real change to his wounds. He has the usual accumulation of macerated callus around his heel with slough on the wound surface. The dorsal foot wound continues to have periwound tissue maceration and slough on the surface. I used a curette to debride slough from the dorsal foot and macerated skin and slough from the calcaneus. I am going to use his Keystone topical antibiotic compound (prescription drug) to both sites with [facility]. He seems to be having a reaction to the adhesive from his foam border dressing so we will just apply 4 x 4 gauze and wrap his foot with Kerlix. He will follow-up in 2 weeks. Electronic Signature(s) Signed: 05/27/2023 12:43:00 PM By: [name] MD FACS Entered By: [name] on 05/27/2023 12:43:00 -------------------------------------------------------------------------------- HxROS Details Patient Name: Date of Service: [name], [name]. 05/27/2023 11:15 A M Medical Record Number: [id number] Patient Account Number: [id number] Date of Birth/Sex: Treating RN: 07/01/1956 (67 y.o. [name] Primary Care Provider: Foye [name] Other Clinician: Referring Provider: Treating Provider/Extender: Kandis Cocking in Treatment: 37 Eyes Medical History: Positive for: Cataracts Negative for: Glaucoma; Optic Neuritis Ear/Nose/Mouth/Throat Medical History: Negative for: Chronic sinus problems/congestion; Middle ear problems Respiratory Medical History: Positive for: Sleep Apnea Cardiovascular Medical History: Positive for: Hypertension Past Medical History  Notes: hyperlipidemia Gastrointestinal [name], [name] ([id number]) [id number] Page 12 of 13 Medical History: Negative for: Cirrhosis ; Colitis; Crohns; Hepatitis A; Hepatitis B; Hepatitis C Endocrine Medical History: Positive for: Type II Diabetes Time with diabetes: 20 years Treated with: Insulin Blood sugar tested every day: Yes Tested : 2-3 Genitourinary Medical History: Past Medical History Notes: AKI Immunological Medical History: Negative for: Lupus Erythematosus; Raynauds; Scleroderma Musculoskeletal Medical History: Positive for: Osteomyelitis - 2023 Neurologic Medical History: Positive for: Neuropathy - Bila lower extremities Oncologic Medical History: Negative for: Received Chemotherapy; Received Radiation Psychiatric Medical History: Negative for: Anorexia/bulimia; Confinement Anxiety HBO Extended History Items Eyes: Cataracts Immunizations Pneumococcal Vaccine: Received Pneumococcal Vaccination: Yes Received Pneumococcal Vaccination On or After 60th Birthday: Yes Implantable Devices Yes Hospitalization / Surgery History Type of Hospitalization/Surgery I and D Left calcaneus back surgery- laminectomy eye surgery- Bila cataracts shoulder arthroscopy Family and Social History Cancer: Yes - Father,Mother; Diabetes: Yes - Mother,Father; Heart Disease: No; Hereditary Spherocytosis: No; Hypertension: No; Kidney Disease: No; Lung Disease: Yes - Father; Seizures: No; Stroke: No; Thyroid Problems: Yes - Mother; Tuberculosis: No; Never smoker; Marital Status - Married; Alcohol Use: Never; Drug Use: No History; Caffeine Use: Daily; Financial Concerns: No; Food, Clothing or Shelter Needs: No; Support System Lacking: No; Transportation Concerns: No Electronic Signature(s) Signed: 05/27/2023 12:49:31 PM By: [name] MD FACS Signed: 05/27/2023 3:21:56 PM By: Gelene Mink By: [name] on 05/27/2023  12:40:46 [name], [name] ([id number]) [id number] Page 13 of 13 -------------------------------------------------------------------------------- SuperBill Details Patient Name: Date of Service: [place], [name]. 05/27/2023 Medical Record Number: [id number] Patient Account Number: [id number] Date of Birth/Sex: Treating RN: 03/20/1956 (67 y.o. 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, left ankle and foot E11.65 Type 2 diabetes mellitus with hyperglycemia E11.621 Type 2 diabetes mellitus with foot ulcer Facility Procedures : The patient participates with Medicare or their insurance follows the Medicare Facility Guidelines: CPT4 Code Description Modifier Quantity [id number] [phone] - DEBRIDE WOUND 1ST 20 SQ CM OR < 1 ICD-10 Diagnosis Description L97.424 Non-pressure chronic ulcer  of left heel and midfoot with necrosis of bone L97.523 Non-pressure chronic ulcer of other part of left foot with necrosis of muscle : The patient participates with Medicare or their insurance follows the Medicare Facility Guidelines: [id number] - DEBRIDE WOUND EA ADDL 20 SQ CM 1 ICD-10 Diagnosis Description 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 L97.523 Non-pressure chronic ulcer of other part of left foot with necrosis of muscle Physician Procedures : CPT4 Code Description Modifier [id number] - WC PHYS LEVEL 4 - EST PT 25 ICD-10 Diagnosis Description L97.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.523 Non-pressure  chronic ulcer of other part of left foot with  necrosis of 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, 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 Quantity: 1 : [id number] - WC PHYS DEBR WO ANESTH 20 SQ CM ICD-10 Diagnosis Description 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

## 2023-05-30 ENCOUNTER — Telehealth: Payer: Self-pay | Admitting: *Deleted

## 2023-05-30 NOTE — Patient Outreach (Signed)
  Care Coordination   05/30/2023 Name: Devun Engberg MRN: [id number] DOB: [birth year]   Care Coordination Outreach Attempts:  An unsuccessful telephone outreach was attempted today to offer the patient information about available care coordination services.  Follow Up Plan:  Additional outreach attempts will be made to offer the patient care coordination information and services.   Encounter Outcome:  No Answer   Care Coordination Interventions:  No, not indicated   Reece Levy, MSW, LCSW Clinical Social Worker [facility] [phone]

## 2023-05-31 ENCOUNTER — Ambulatory Visit: Payer: Self-pay | Admitting: *Deleted

## 2023-06-01 NOTE — Patient Outreach (Addendum)
  Care Coordination   Follow Up Visit Note   06/01/2023 Name: [name] MRN: [id number] DOB: 05-13-1956  [name] is a 67 y.o. year old male who sees [name], MD for primary care. I spoke with  [name] by phone today.  What matters to the patients health and wellness today?  Health and mental health wellness.    Goals Addressed             This Visit's Progress    Planning for care needs       Activities and task to complete in order to accomplish goals.   Glad had an introduction call with the mental health provider and are now scheduled for initial visit Call your insurance provider for more information about your Enhanced Benefits  Plan to attend 06/23/23 Amputee Support Group either in-person or online   Start / continue healthy coping activities including relaxed breathing, mindfulness techniques, etc (will email some info to you)  Keep all upcoming appointments Continue with compliance of taking medication prescribed by Doctor Glad you enjoyed your first meet up/lunch with your 2 peer support men-  Make list of questions/concerns, etc to take with you to your next meet up wit them   Await call from [facility]- for hopeful help with providing assistance with light housekeeping, laundry, errands and some personal care  Consider private pay home care options if needed/desired to help with wife's care needs while you are hospitalized and rehabilitating Review insurance policy to see if you have Long-term care insurance or a custodial care benefit Complete Advance Directive packet,  Have advance directive notarized and provide a copy to provider office          SDOH assessments and interventions completed:  Yes     Care Coordination Interventions:  Yes, provided  Interventions Today    Flowsheet Row Most Recent Value  Chronic Disease   Chronic disease during today's visit Other  [depression]  General  Interventions   General Interventions Discussed/Reviewed Community Resources  Mental Health Interventions   Mental Health Discussed/Reviewed Other  [Pt continues to feel overwhelmed by home life and tasks as he tries to cook, clean, mow and take care of wife]       Follow up plan:  6 /26/24   Encounter Outcome:  Pt. Visit Completed

## 2023-06-01 NOTE — Patient Instructions (Signed)
Visit Information  Thank you for taking time to visit with me today. Please don't hesitate to contact me if I can be of assistance to you.   Following are the goals we discussed today:   Goals Addressed             This Visit's Progress    Planning for care needs       Activities and task to complete in order to accomplish goals.   Glad had an introduction call with the mental health provider and are now scheduled for initial visit Call your insurance provider for more information about your Enhanced Benefits  Plan to attend 06/23/23 Amputee Support Group either in-person or online   Start / continue healthy coping activities including relaxed breathing, mindfulness techniques, etc (will email some info to you)  Keep all upcoming appointments Continue with compliance of taking medication prescribed by Doctor Glad you enjoyed your first meet up/lunch with your 2 peer support men-  Make list of questions/concerns, etc to take with you to your next meet up wit them   Await call from [facility]- for hopeful help with providing assistance with light housekeeping, laundry, errands and some personal care  Consider private pay home care options if needed/desired to help with wife's care needs while you are hospitalized and rehabilitating Review insurance policy to see if you have Long-term care insurance or a custodial care benefit Complete Advance Directive packet,  Have advance directive notarized and provide a copy to provider office          Our next appointment is by telephone on 06/08/23   Please call the care guide team at [phone] if you need to cancel or reschedule your appointment.   If you are experiencing a Mental Health or Behavioral Health Crisis or need someone to talk to, please call the Suicide and Crisis Lifeline: 988 call 911   The patient verbalized understanding of instructions, educational materials, and care plan provided today and  DECLINED offer to receive copy of patient instructions, educational materials, and care plan.   Telephone follow up appointment with care management team member scheduled for: 06/08/23  Reece Levy, MSW, LCSW Clinical Social Worker [facility] [phone]

## 2023-06-08 ENCOUNTER — Ambulatory Visit: Payer: Self-pay | Admitting: *Deleted

## 2023-06-08 NOTE — Patient Outreach (Signed)
  Care Coordination   Follow Up Visit Note   06/08/2023 Name: [name] MRN: [id number] DOB: 1956/12/04  [name] is a 67 y.o. year old male who sees [name], MD for primary care. I spoke with  [name] by phone today.  What matters to the patients health and wellness today?  Pt's wife is in hospital. Pt had his initial/intake visit with Psychologist for counseling support.    Goals Addressed             This Visit's Progress    Planning for care needs       Activities and task to complete in order to accomplish goals.   Glad you met with "[name]" for initial introduction and have next counseling session set for 06/14/23 Call your insurance provider for more information about your Enhanced Benefits  Plan to attend 06/23/23 Amputee Support Group either in-person or online   Start / continue healthy coping activities including relaxed breathing, mindfulness techniques, etc (will email some info to you)  Keep all upcoming appointments Continue with compliance of taking medication prescribed by Doctor Glad you enjoyed your first meet up/lunch with your 2 peer support men-  Make list of questions/concerns, etc to take with you to your next meet up wit them   Await call from [facility]- for hopeful help with providing assistance with light housekeeping, laundry, errands and some personal care  Consider private pay home care options if needed/desired to help with wife's care needs while you are hospitalized and rehabilitating Review insurance policy to see if you have Long-term care insurance or a custodial care benefit Complete Advance Directive packet,  Have advance directive notarized and provide a copy to provider office          SDOH assessments and interventions completed:  Yes     Care Coordination Interventions:  Yes, provided  Interventions Today    Flowsheet Row Most Recent Value  Chronic Disease   Chronic disease  during today's visit Other  [Pt continues with wound center]  General Interventions   General Interventions Discussed/Reviewed Community Resources  Mental Health Interventions   Mental Health Discussed/Reviewed --  [Pt had initial visit with "[name]" his counselor and will have next (full) session on 06/14/23. Pt is optimistic.]       Follow up plan: Follow up call scheduled for 06/15/23    Encounter Outcome:  Pt. Visit Completed

## 2023-06-08 NOTE — Patient Instructions (Signed)
Visit Information  Thank you for taking time to visit with me today. Please don't hesitate to contact me if I can be of assistance to you.   Following are the goals we discussed today:   Goals Addressed             This Visit's Progress    Planning for care needs       Activities and task to complete in order to accomplish goals.   Glad you met with "[name]" for initial introduction and have next counseling session set for 06/14/23 Call your insurance provider for more information about your Enhanced Benefits  Plan to attend 06/23/23 Amputee Support Group either in-person or online   Start / continue healthy coping activities including relaxed breathing, mindfulness techniques, etc (will email some info to you)  Keep all upcoming appointments Continue with compliance of taking medication prescribed by Doctor Glad you enjoyed your first meet up/lunch with your 2 peer support men-  Make list of questions/concerns, etc to take with you to your next meet up wit them   Await call from [facility]- for hopeful help with providing assistance with light housekeeping, laundry, errands and some personal care  Consider private pay home care options if needed/desired to help with wife's care needs while you are hospitalized and rehabilitating Review insurance policy to see if you have Long-term care insurance or a custodial care benefit Complete Advance Directive packet,  Have advance directive notarized and provide a copy to provider office          Our next appointment is by telephone on 06/15/23   Please call the care guide team at [phone] if you need to cancel or reschedule your appointment.   If you are experiencing a Mental Health or Behavioral Health Crisis or need someone to talk to, please call the Suicide and Crisis Lifeline: 988 call 911   The patient verbalized understanding of instructions, educational materials, and care plan provided today and DECLINED  offer to receive copy of patient instructions, educational materials, and care plan.   Telephone follow up appointment with care management team member scheduled for: 06/15/23  Reece Levy, MSW, LCSW Clinical Social Worker [facility] [phone]

## 2023-06-10 ENCOUNTER — Encounter (HOSPITAL_BASED_OUTPATIENT_CLINIC_OR_DEPARTMENT_OTHER): Payer: Medicare Other | Admitting: General Surgery

## 2023-06-10 DIAGNOSIS — E11621 Type 2 diabetes mellitus with foot ulcer: Secondary | ICD-10-CM | POA: Diagnosis not present

## 2023-06-10 NOTE — Progress Notes (Signed)
[name], [name] ([id number]) [id number] Page 1 of 14 Visit Report for 06/10/2023 Chief Complaint Document Details Patient Name: Date of Service: [name], [name]. 06/10/2023 10:15 A M Medical Record Number: [id number] Patient Account Number: [id number] Date of Birth/Sex: Treating RN: 11-May-1956 (67 y.o. M) Primary Care Provider: Foye [name] Other Clinician: Referring Provider: Treating Provider/Extender: Kandis Cocking in Treatment: 39 Information Obtained from: Patient Chief Complaint Patients presents for treatment of an open diabetic ulcer and evaluation for hyperbaric oxygen therapy Electronic Signature(s) Signed: 06/10/2023 11:14:46 AM By: [name] MD FACS Entered By: [name] on 06/10/2023 11:14:46 -------------------------------------------------------------------------------- Debridement Details Patient Name: Date of Service: [name], [name] 06/10/2023 10:15 A M Medical Record Number: [id number] Patient Account Number: [id number] Date of Birth/Sex: Treating RN: 04/06/1956 (67 y.o. M) Primary Care Provider: Foye [name] Other Clinician: Referring Provider: Treating Provider/Extender: Kandis Cocking in Treatment: 39 Debridement Performed for Assessment: Wound #1 Left Calcaneus Performed By: Physician [name], MD Debridement Type: Debridement Severity of Tissue Pre Debridement: Fat layer exposed Level of Consciousness (Pre-procedure): Awake and Alert Pre-procedure Verification/Time Out Yes - 11:00 Taken: Start Time: 11:01 Pain Control: Lidocaine 4% T opical Solution Percent of Wound Bed Debrided: 100% T Area Debrided (cm): otal 25.9 Tissue and other material debrided: Viable, Non-Viable, Callus, Slough, Slough Level: Non-Viable Tissue Debridement Description: Selective/Open Wound Instrument: Curette Bleeding: Minimum Hemostasis Achieved: Pressure End Time:  11:03 Procedural Pain: 0 Post Procedural Pain: 0 Response to Treatment: Procedure was tolerated well Level of Consciousness (Post- Awake and Alert procedure): Post Debridement Measurements of Total Wound Length: (cm) 6 Width: (cm) 5.5 Depth: (cm) 0.4 Volume: (cm) 10.367 Character of Wound/Ulcer Post Debridement: Improved [name], [name] ([id number]) [id number] Page 2 of 14 Severity of Tissue Post Debridement: Fat layer exposed Post Procedure Diagnosis Same as Pre-procedure Notes Scribed for Dr Lady Gary by [name] RN. Electronic Signature(s) Signed: 06/10/2023 11:14:25 AM By: [name] MD FACS Entered By: [name] on 06/10/2023 11:14:25 -------------------------------------------------------------------------------- Debridement Details Patient Name: Date of Service: [name], [name] 06/10/2023 10:15 A M Medical Record Number: [id number] Patient Account Number: [id number] Date of Birth/Sex: Treating RN: December 05, 1956 (67 y.o. M) Primary Care Provider: Foye [name] Other Clinician: Referring Provider: Treating Provider/Extender: Kandis Cocking in Treatment: 39 Debridement Performed for Assessment: Wound #2 Left,Dorsal Foot Performed By: Physician [name], MD Debridement Type: Debridement Severity of Tissue Pre Debridement: Fat layer exposed Level of Consciousness (Pre-procedure): Awake and Alert Pre-procedure Verification/Time Out Yes - 11:00 Taken: Start Time: 11:01 Percent of Wound Bed Debrided: 100% T Area Debrided (cm): otal 5.89 Tissue and other material debrided: Viable, Non-Viable, Slough, Slough Level: Non-Viable Tissue Debridement Description: Selective/Open Wound Instrument: Curette Bleeding: Minimum Hemostasis Achieved: Pressure End Time: 11:03 Procedural Pain: 0 Post Procedural Pain: 0 Response to Treatment: Procedure was tolerated well Level of Consciousness (Post- Awake and  Alert procedure): Post Debridement Measurements of Total Wound Length: (cm) 3 Width: (cm) 2.5 Depth: (cm) 0.1 Volume: (cm) 0.589 Character of Wound/Ulcer Post Debridement: Improved Severity of Tissue Post Debridement: Fat layer exposed Post Procedure Diagnosis Same as Pre-procedure Notes Scribed for Dr Lady Gary by [name] RN. Electronic Signature(s) Signed: 06/10/2023 11:14:37 AM By: [name] MD FACS Entered By: [name] on 06/10/2023 11:14:37 [name], [name] ([id number]) [id number] Page 3 of 14 -------------------------------------------------------------------------------- HPI Details Patient Name: Date of Service: [name], [name]. 06/10/2023 10:15 A M Medical Record Number: [id number] Patient Account Number: [id number] Date  of Birth/Sex: Treating RN: August 03, 1956 (67 y.o. M) Primary Care Provider: Foye [name] Other Clinician: Referring Provider: Treating Provider/Extender: Kandis Cocking in Treatment: 39 History of Present Illness HPI Description: ADMISSION 09/10/2022 This is a 67 year old poorly controlled type II diabetic (last hemoglobin A1c 10.8%) who has had an ulcer on his heel for over 3 years. He has been seen in multiple wound care centers, including [facility]. He reports that at least 3 doctors have recommended that he undergo below-knee amputation. He most recently met with Dr. Reuel Boom, a vascular surgeon affiliated with [facility]. 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 Unfortunately, the patient has some extenuating social circumstances including the fact that he cares for his wife who has stage IV colon cancer and still works, driving vehicles for [employer]. 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 He was recently hospitalized at [facility] for  sepsis and currently has a PICC line through which he receives IV antibiotics. He reports having had another MRI during that hospital stay along with a chest x-ray and EKG. He apparently contacted one of the hyperbaric therapy techs here and asked a number of questions about hyperbaric oxygen treatments. He subsequently self-referred to our center to undergo further evaluation and management. I mention to him that [facility] actually has hyperbaric chambers, but he states that he lives in [place] and this would be more convenient for him given the intensive nature of the therapy and time requirement. ABI in clinic today was 0.94. The patient actually has 2 wounds. There is a wound on the dorsum of his left foot with heavy black eschar and slough present. After debridement, this was demonstrated to involve the muscle and the extensor tendons are exposed. On his heel, there is essentially a "shark bite" type wound, with much of the heel fat pad absent. The muscle layer is exposed. There is blue-green staining around the perimeter of the wound, but no significant odor. He says he has been applying collagen to the wound on his heel and Silvadene and Betadine to the wound on his dorsal foot. 09/20/2022: The heel wound is quite macerated with wet periwound callus. There is slough accumulation on the surface. The dorsal foot wound looks better this week. There is still exposed tendon, but it is fairly clean with just a little biofilm buildup. We are still working on gathering the required documentation to submit for pretreatment review for hyperbaric oxygen therapy. 09/29/2022: The dorsal foot wound continues to improve. I do not see any exposed tendon at this point. There is just some slough accumulation on the wound surface. He continues to have very wet macerated periwound callus on his heel. There is slough on the surface, but it is loose and thin. There is an area of undermining at the 11 o'clock  position, but the overlying skin and subcutaneous tissue is healthy and viable. He has been approved for hyperbaric oxygen therapy and will start treatment tomorrow. 10/06/2022: Continued contraction and improvement of the dorsal foot wound. There is just a little bit of slough accumulation on the wound surface. The periwound callus continues to accumulate on the heel wound and it is quite macerated. It is also persistently found with blue-green staining present. He did initiate his hyperbaric oxygen therapy, but has had significant difficulty with decompression. He will be going to ENT to have PE tubes placed.  10/18/2022: His hyperbaric oxygen therapy is on hold while his otological issues are being addressed. He came in again today with the periwound skin on both the dorsal aspect of his foot and his calcaneus completely macerated. The blue-green discoloration, however, has abated and the undermining on the calcaneus has improved. The dorsal foot wound has also contracted somewhat. 10/26/2022: The dorsal foot wound continues to contract and fill with good granulation tissue. The heel, once again, has a rim of macerated callus, but the undermining and tunneling continues to contract. He has completed his oral antibiotics and has been using the [facility] topical compounded antibiotic for his dressing changes at home. He is scheduled to see ENT this afternoon. 11/08/2022: The dorsal foot wound is flush with the surrounding skin and has a good granulation tissue surface. There is some slough accumulation. As usual, the heel has a rim of macerated callus but the dimensions are smaller and the tunneling and undermining have contracted further. He has resumed his hyperbaric oxygen therapy. 12/12; patient seen for wound evaluation. He is tolerating HBO although we could not dive him yesterday because of relative hypoglycemia and the fact he had given himself NovoLog insulin before he came to clinic. Today his  blood sugar is in the 180 range she should be fine. He has a large wound on the plantar calcaneus on the right and a more superficial area on the dorsal foot. He is using a scooter for offloading 11/30/2022: The dorsal foot wound continues to contract. There is some slough on the wound surface. The large calcaneus wound has heaped up wet callus around the margins. Continued undermining. 12/08/2022: The dorsal foot wound is flat and flush with the surrounding skin surface. There is some slough present. Once again, the calcaneal wound has thick, absolutely macerated callus hanging off in tatters around the edges. The patient cannot explain to me why this part of his wound gets so wet. There is slough on the surface. The undermined portion of the wound has filled in. 12/17/2022: Earlier this week, he presented for his hyperbaric oxygen therapy and was hypotensive and ill-appearing. He also had what appeared to be an infected sebaceous cyst on his right upper arm. He was sent to the emergency department. He was given a fluid bolus and the ED provider lanced the cyst. He was prescribed doxycycline. He seems to be feeling better today. He notes that he has had copious drainage from his foot. On further questioning, he states that he has not been taking his oral diuretic. The dorsal foot wound has expanded somewhat, but is actually more superficial. The plantar heel wound is about the same. 12/27/2022: The dorsal foot wound has epithelialized further. The plantar foot wound is about the same size but has heaped up macerated callus around the perimeter. The intake nurse noted a tiny fragment of bone on his dressings and there is now an area at about the 5 o'clock position where 1 can palpate bone through a small slit in the soft tissues. The wound on his arm is contracting but he still likely has cyst wall present due to the manner in which the infected sebaceous cyst was addressed in the ER. 01/01/2023: The  wound on his shoulder has contracted considerably. The dorsal foot wound has some slough on the surface but also looks to be improving. The plantar foot wound is basically unchanged. 01/10/2023: The wound on his shoulder continues to diminish in size and depth. The dorsal foot wound is improving with  just a light layer of slough on the SON, BONESTEEL ([id number]) [id number] Page 4 of 14 surface. The plantar foot wound appears to be contracting but still has thick wet callus around the perimeter. We did in chamber TcPO2 monitoring during his last hyperbaric oxygen therapy. The results show that he has extremely poor tissue perfusion at room air and 1 atm of pressure. He does respond extremely well to hyperbaric oxygen therapy, however. 01/17/2023: The wound on his shoulder is down to 0.6 cm in depth, down from 1 cm last week. The dorsal foot wound is quite a bit smaller this week with just some light slough on the surface. The plantar foot wound is also smaller and the area of bone that was exposed at the posterior heel is now covered. There is still substantial wet callus around the perimeter. He is scheduled to undergo formal vascular studies sometime next week. 01/24/2023: The wound on his shoulder has almost completely filled in, with just a little bit of depth remaining. The dorsal foot wound got macerated; it appears that his home health nurses are applying the drape for his heel wound over the top of this, meaning that the dorsal foot wound is not getting changed as regularly as it is supposed to. There is some slough on the surface. The plantar foot wound has thick wet callus around the perimeter. The dimensions measure a little bit smaller. He is having his vascular studies done this afternoon. 02/07/2023: The wound on his shoulder is healed. The dorsal foot wound continues to be subjected to excessive moisture due to the home health nurses applying the drape from his  wound VAC over the top of it. It does measure smaller, however. There is more bone exposed on his calcaneus, unfortunately. He has heaped up wet callus around the edges of the heel wound again. He is going to undergo angiography with Dr. Lenell Antu on Friday. Hopefully there will be intervention available to him that may aid in his wound healing. 02/14/2023: He did not get his angiogram last week with Dr. Lenell Antu because he did not have a ride home. The dorsal foot wound looks a little bit better with just a light layer of slough. Unfortunately, there is more bone exposed at the calcaneus and I am concerned that we are nearing a point at which his leg will not be salvageable. 3/18; patient presents for follow-up. He was recently hospitalized on 3/11 for sepsis secondary to diabetic foot wound. He was dishcharged with Augmentin and doxycycline and plan is for 6 weeks of this. He currently has a wound VAC. Wound has declined in appearance. Amputation was recommended in the hospital however patient is not ready for this. Patient states he would like to stop HBO. He currently denies systemic signs of infection. 03/07/2023: The calcaneal wound continues to deteriorate. There is significant undermining around the posterior aspect of the calcaneus and bone is palpable all the way around. The dorsal foot wound has some leathery eschar and slough present. 03/14/2023: The dorsal foot wound is somewhat leathery with some dark eschar and slough present. The calcaneal wound is deteriorating further. There are fragments of the bone hanging loose. He does have an appointment with Dr. Lenell Antu next week on Tuesday to discuss amputation. 04/04/2023: The heel ulcer is larger, but shallower. There is heaped up with callus around the edges. The dorsal foot wound is stable. He has appointment with Dr. Lenell Antu was rescheduled for next Tuesday. He is working on arrangements for his  dog and his wife to facilitate below-knee  amputation. 04/12/2023: The heel ulcer looks about the same. Bone is not exposed. There is moist callus heaped up around the edges. The dorsal foot wound is stable. He will be seeing Dr. Lenell Antu this afternoon to discuss amputation. 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 The dorsal foot wound is a little bit smaller and shallower. It is not as irritated as it often looks. There is slough on the surface. He met with Dr. Lenell Antu and is still not ready to pursue amputation at this time. 05/16/2023: The dorsal foot wound has gotten larger and there appears to be a lot of drainage resulting in periwound tissue maceration. The calcaneal wound also looks similar, with heaped up macerated callus around the edges and a layer of slough on the surface. 05/27/2023: No real change to his wounds. He is still avoiding amputation. 06/10/2023: The dorsal foot wound actually looks a little bit better this week. There is light slough on the surface. On the plantar heel wound, he has less wet callus accumulation. There is rubbery slough on the surface. Electronic Signature(s) Signed: 06/10/2023 11:17:46 AM By: [name] MD FACS Entered By: [name] on 06/10/2023 11:17:46 -------------------------------------------------------------------------------- Physical Exam Details Patient Name: Date of Service: [place], [name] 06/10/2023 10:15 A M Medical Record Number: [id number] Patient Account Number: [id number] Date of Birth/Sex: Treating RN: 24-May-1956 (67 y.o. M) Primary Care Provider: Foye [name] Other Clinician: Referring Provider: Treating Provider/Extender: Kandis Cocking in Treatment: 39 Constitutional . . . . no acute distress. Respiratory Normal work of breathing on room air. Notes 06/10/2023: The dorsal foot wound actually looks a little bit better this week.  There is light slough on the surface. On the plantar heel wound, he has less wet callus accumulation. There is rubbery slough on the surface. Electronic Signature(s) Signed: 06/10/2023 11:21:14 AM By: [name] MD FACS Victoria Vera, Rodriques 06/10/2023 11:21:14 AM By: [name] MD FACS Signed: R ([id number]) [id number] Page 5 of 14 Entered By: [name] on 06/10/2023 11:21:14 -------------------------------------------------------------------------------- Physician Orders Details Patient Name: Date of Service: [place], [name]. 06/10/2023 10:15 A M Medical Record Number: [id number] Patient Account Number: [id number] Date of Birth/Sex: Treating RN: 03-Sep-1956 (67 y.o. 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 - Dr. Lady Gary Room 2 Return A Anesthetic Wound #1 Left Calcaneus (In clinic) Topical Lidocaine 4% applied to wound bed Wound #2 Left,Dorsal Foot (In clinic) Topical Lidocaine 4% applied to wound bed Bathing/ Shower/ Hygiene May shower and wash wound with soap and water. - use antibacterial soap Edema Control - Lymphedema / SCD / Other Left Lower Extremity Elevate legs to the level of the heart or above for 30 minutes daily and/or when sitting for 3-4 times a day throughout the day. Avoid standing for long periods of time. Moisturize legs daily. Compression  stocking or Garment 30-40 mm/Hg pressure to: -  left leg daily Off-Loading Wound #1 Left Calcaneus Other: - use knee scooter to ambulate, minimal weight bearing right foot Wound Treatment Wound #1 - Calcaneus Wound Laterality: Left Cleanser: Soap and Water 1 x Per Day/30 Days Discharge Instructions: May shower and wash wound with dial antibacterial soap and water prior to dressing change. Cleanser: Vashe 5.8 (oz) 1 x Per Day/30 Days Discharge Instructions: Cleanse the wound with Vashe prior to applying a clean dressing using gauze sponges, not tissue or cotton balls. Peri-Wound Care: Zinc Oxide Ointment 30g tube 1 x Per Day/30 Days Discharge Instructions: Apply Zinc Oxide to periwound with each dressing change Topical: Gentamicin 1 x Per Day/30 Days Discharge Instructions: Apply to wound when keystone not available Topical: Mupirocin Ointment 1 x Per Day/30 Days Discharge Instructions: when keystone not available apply Mupirocin (Bactroban) as instructed Topical: keystone 1 x Per Day/30 Days Prim Dressing: Hydrofera Blue Ready Transfer Foam, 4x5 (in/in) 1 x Per Day/30 Days ary Discharge Instructions: Apply to wound bed as instructed ARY, PERE ([id number]) [id number] Page 6 of 14 Secondary Dressing: Woven Gauze Sponge, Non-Sterile 4x4 in (Generic) 1 x Per Day/30 Days Discharge Instructions: Wet with Vashe we to dry. Secondary Dressing: Zetuvit Plus 4x8 in (Generic) 1 x Per Day/30 Days Discharge Instructions: Apply over primary dressing as directed. Secured With: Coban Self-Adherent Wrap 4x5 (in/yd) (Generic) 1 x Per Day/30 Days Discharge Instructions: Secure with Coban as directed. Secured With: [employer], 4.5x3.1 (in/yd) (Generic) 1 x Per Day/30 Days Discharge Instructions: Secure with Kerlix as directed. Wound #2 - Foot Wound Laterality: Dorsal, Left Cleanser: Soap and Water 1 x Per Day/30 Days Discharge Instructions: May shower and wash  wound with dial antibacterial soap and water prior to dressing change. Cleanser: Vashe 5.8 (oz) 1 x Per Day/30 Days Discharge Instructions: Cleanse the wound with Vashe prior to applying a clean dressing using gauze sponges, not tissue or cotton balls. Peri-Wound Care: Zinc Oxide Ointment 30g tube 1 x Per Day/30 Days Discharge Instructions: Apply Zinc Oxide to periwound with each dressing change Topical: Gentamicin 1 x Per Day/30 Days Discharge Instructions: Apply to wound when keystone not available Topical: Mupirocin Ointment 1 x Per Day/30 Days Discharge Instructions: when keystone not available apply Mupirocin (Bactroban) as instructed Topical: keystone 1 x Per Day/30 Days Prim Dressing: Promogran Prisma Matrix, 4.34 (sq in) (silver collagen) 1 x Per Day/30 Days ary Discharge Instructions: Moisten collagen with saline or hydrogel Prim Dressing: Hydrofera Blue Ready Transfer Foam, 2.5x2.5 (in/in) 1 x Per Day/30 Days ary Discharge Instructions: Apply directly to wound bed as directed Secondary Dressing: Woven Gauze Sponge, Non-Sterile 4x4 in (Generic) 1 x Per Day/30 Days Discharge Instructions: Wet with Vashe we to dry. Secondary Dressing: Zetuvit Plus 4x8 in (Generic) 1 x Per Day/30 Days Discharge Instructions: Apply over primary dressing as directed. Secured With: Coban Self-Adherent Wrap 4x5 (in/yd) (Generic) 1 x Per Day/30 Days Discharge Instructions: Secure with Coban as directed. Secured With: [employer], 4.5x3.1 (in/yd) (Generic) 1 x Per Day/30 Days Discharge Instructions: Secure with Kerlix as directed. Electronic Signature(s) Signed: 06/10/2023 11:24:38 AM By: [name] MD FACS Entered By: [name] on 06/10/2023 11:21:28 -------------------------------------------------------------------------------- Problem List Details Patient Name: Date of Service: [place], [name] 06/10/2023 10:15 A M Medical Record Number: [id number] Patient Account Number:  [id number] Date of Birth/Sex: Treating RN: 11-20-1956 (67 y.o. 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, left ankle and foot 09/10/2022 No Yes E11.65 Type 2 diabetes mellitus with hyperglycemia 09/10/2022 No Yes E11.621 Type 2 diabetes mellitus with foot ulcer 09/10/2022 No Yes Inactive Problems ICD-10 Code Description Active Date Inactive Date L72.3 Sebaceous cyst 12/17/2022 12/17/2022 Resolved Problems ICD-10 Code Description Active Date Resolved Date L98.492 Non-pressure chronic ulcer of skin of other sites with fat layer exposed 12/17/2022 01/03/2023 Electronic Signature(s) Signed: 06/10/2023 11:13:59 AM By: [name] MD FACS Entered By: [name] on 06/10/2023 11:13:59 -------------------------------------------------------------------------------- Progress Note Details Patient Name: Date of Service: [name], [name] 06/10/2023 10:15 A M Medical Record Number: [id number] Patient Account Number: [id number] Date of Birth/Sex: Treating RN: 08/07/1956 (67 y.o. M) Primary Care Provider: Foye [name] Other Clinician: Referring Provider: Treating Provider/Extender: Kandis Cocking in Treatment: 39 Subjective Chief Complaint Information obtained from Patient Patients presents for treatment of an open diabetic ulcer and evaluation for hyperbaric oxygen therapy History of Present Illness (HPI) ADMISSION 09/10/2022 This is  a 67 year old poorly controlled type II diabetic (last hemoglobin A1c 10.8%) who has had an ulcer on his heel for over 3 years. He has been seen in multiple wound care centers, including [facility] and [facility]. He reports that at least 3 doctors have recommended that he undergo below-knee amputation. He most recently met with Dr. Reuel Boom, a vascular surgeon affiliated with [facility]. 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 Unfortunately, the patient has some extenuating social circumstances including the fact that he cares for his wife who has stage IV colon cancer and still works, driving vehicles for [employer]. 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 He was recently hospitalized at [facility] for sepsis and currently has a PICC line through which he receives IV antibiotics. He reports having had another MRI during that hospital stay along with a chest x-ray and EKG. He apparently contacted one of the hyperbaric [name], [name] ([id number]) [id number] Page 8 of 14 therapy techs here and asked a number of questions about hyperbaric oxygen treatments. He subsequently self-referred to our center to undergo further evaluation and management. I mention to him that [facility] actually has hyperbaric chambers, but he states that he lives in [place] and this would be more convenient for him given the intensive nature of the therapy and time requirement. ABI in clinic today was 0.94. The patient actually has 2 wounds. There is a wound on the dorsum of his left foot with heavy black eschar and slough present. After debridement, this was demonstrated to involve the muscle and the extensor tendons are exposed. On his heel, there is essentially a "shark bite" type wound, with much of the heel fat pad absent. The muscle layer is exposed. There is  blue-green staining around the perimeter of the wound, but no significant odor. He says he has been applying collagen to the wound on his heel and Silvadene and Betadine to the wound on his dorsal foot. 09/20/2022: The heel wound is quite macerated with wet periwound callus. There is slough accumulation on the surface. The dorsal foot wound looks  better this week. There is still exposed tendon, but it is fairly clean with just a little biofilm buildup. We are still working on gathering the required documentation to submit for pretreatment review for hyperbaric oxygen therapy. 09/29/2022: The dorsal foot wound continues to improve. I do not see any exposed tendon at this point. There is just some slough accumulation on the wound surface. He continues to have very wet macerated periwound callus on his heel. There is slough on the surface, but it is loose and thin. There is an area of undermining at the 11 o'clock position, but the overlying skin and subcutaneous tissue is healthy and viable. He has been approved for hyperbaric oxygen therapy and will start treatment tomorrow. 10/06/2022: Continued contraction and improvement of the dorsal foot wound. There is just a little bit of slough accumulation on the wound surface. The periwound callus continues to accumulate on the heel wound and it is quite macerated. It is also persistently found with blue-green staining present. He did initiate his hyperbaric oxygen therapy, but has had significant difficulty with decompression. He will be going to ENT to have PE tubes placed. 10/18/2022: His hyperbaric oxygen therapy is on hold while his otological issues are being addressed. He came in again today with the periwound skin on both the dorsal aspect of his foot and his calcaneus completely macerated. The blue-green discoloration, however, has abated and the undermining on the calcaneus has improved. The dorsal foot wound has also contracted somewhat. 10/26/2022:  The dorsal foot wound continues to contract and fill with good granulation tissue. The heel, once again, has a rim of macerated callus, but the undermining and tunneling continues to contract. He has completed his oral antibiotics and has been using the [facility] topical compounded antibiotic for his dressing changes at home. He is scheduled to see ENT this afternoon. 11/08/2022: The dorsal foot wound is flush with the surrounding skin and has a good granulation tissue surface. There is some slough accumulation. As usual, the heel has a rim of macerated callus but the dimensions are smaller and the tunneling and undermining have contracted further. He has resumed his hyperbaric oxygen therapy. 12/12; patient seen for wound evaluation. He is tolerating HBO although we could not dive him yesterday because of relative hypoglycemia and the fact he had given himself NovoLog insulin before he came to clinic. Today his blood sugar is in the 180 range she should be fine. He has a large wound on the plantar calcaneus on the right and a more superficial area on the dorsal foot. He is using a scooter for offloading 11/30/2022: The dorsal foot wound continues to contract. There is some slough on the wound surface. The large calcaneus wound has heaped up wet callus around the margins. Continued undermining. 12/08/2022: The dorsal foot wound is flat and flush with the surrounding skin surface. There is some slough present. Once again, the calcaneal wound has thick, absolutely macerated callus hanging off in tatters around the edges. The patient cannot explain to me why this part of his wound gets so wet. There is slough on the surface. The undermined portion of the wound has filled in. 12/17/2022: Earlier this week, he presented for his hyperbaric oxygen therapy and was hypotensive and ill-appearing. He also had what appeared to be an infected sebaceous cyst on his right upper arm. He was sent to the emergency  department. He was given a fluid bolus and the ED provider lanced the cyst. He was prescribed doxycycline. He  seems to be feeling better today. He notes that he has had copious drainage from his foot. On further questioning, he states that he has not been taking his oral diuretic. The dorsal foot wound has expanded somewhat, but is actually more superficial. The plantar heel wound is about the same. 12/27/2022: The dorsal foot wound has epithelialized further. The plantar foot wound is about the same size but has heaped up macerated callus around the perimeter. The intake nurse noted a tiny fragment of bone on his dressings and there is now an area at about the 5 o'clock position where 1 can palpate bone through a small slit in the soft tissues. The wound on his arm is contracting but he still likely has cyst wall present due to the manner in which the infected sebaceous cyst was addressed in the ER. 01/01/2023: The wound on his shoulder has contracted considerably. The dorsal foot wound has some slough on the surface but also looks to be improving. The plantar foot wound is basically unchanged. 01/10/2023: The wound on his shoulder continues to diminish in size and depth. The dorsal foot wound is improving with just a light layer of slough on the surface. The plantar foot wound appears to be contracting but still has thick wet callus around the perimeter. We did in chamber T cPO2 monitoring during his last hyperbaric oxygen therapy. The results show that he has extremely poor tissue perfusion at room air and 1 atm of pressure. He does respond extremely well to hyperbaric oxygen therapy, however. 01/17/2023: The wound on his shoulder is down to 0.6 cm in depth, down from 1 cm last week. The dorsal foot wound is quite a bit smaller this week with just some light slough on the surface. The plantar foot wound is also smaller and the area of bone that was exposed at the posterior heel is now covered. There  is still substantial wet callus around the perimeter. He is scheduled to undergo formal vascular studies sometime next week. 01/24/2023: The wound on his shoulder has almost completely filled in, with just a little bit of depth remaining. The dorsal foot wound got macerated; it appears that his home health nurses are applying the drape for his heel wound over the top of this, meaning that the dorsal foot wound is not getting changed as regularly as it is supposed to. There is some slough on the surface. The plantar foot wound has thick wet callus around the perimeter. The dimensions measure a little bit smaller. He is having his vascular studies done this afternoon. 02/07/2023: The wound on his shoulder is healed. The dorsal foot wound continues to be subjected to excessive moisture due to the home health nurses applying the drape from his wound VAC over the top of it. It does measure smaller, however. There is more bone exposed on his calcaneus, unfortunately. He has heaped up wet callus around the edges of the heel wound again. He is going to undergo angiography with Dr. Lenell Antu on Friday. Hopefully there will be intervention available to him that may aid in his wound healing. 02/14/2023: He did not get his angiogram last week with Dr. Lenell Antu because he did not have a ride home. The dorsal foot wound looks a little bit better with just a light layer of slough. Unfortunately, there is more bone exposed at the calcaneus and I am concerned that we are nearing a point at which his leg will not be salvageable. 3/18; patient presents for follow-up. He  was recently hospitalized on 3/11 for sepsis secondary to diabetic foot wound. He was dishcharged with Augmentin and doxycycline and plan is for 6 weeks of this. He currently has a wound VAC. Wound has declined in appearance. Amputation was recommended in the hospital however patient is not ready for this. Patient states he would like to stop HBO. He currently  denies systemic signs of infection. 03/07/2023: The calcaneal wound continues to deteriorate. There is significant undermining around the posterior aspect of the calcaneus and bone is palpable all the way around. The dorsal foot wound has some leathery eschar and slough present. 03/14/2023: The dorsal foot wound is somewhat leathery with some dark eschar and slough present. The calcaneal wound is deteriorating further. There are fragments of the bone hanging loose. He does have an appointment with Dr. Lenell Antu next week on Tuesday to discuss amputation. [name], [name] ([id number]) [id number] Page 9 of 14 04/04/2023: The heel ulcer is larger, but shallower. There is heaped up with callus around the edges. The dorsal foot wound is stable. He has appointment with Dr. Lenell Antu was rescheduled for next Tuesday. He is working on arrangements for his dog and his wife to facilitate below-knee amputation. 04/12/2023: The heel ulcer looks about the same. Bone is not exposed. There is moist callus heaped up around the edges. The dorsal foot wound is stable. He will be seeing Dr. Lenell Antu this afternoon to discuss amputation. 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 The dorsal foot wound is a little bit smaller and shallower. It is not as irritated as it often looks. There is slough on the surface. He met with Dr. Lenell Antu and is still not ready to pursue amputation at this time. 05/16/2023: The dorsal foot wound has gotten larger and there appears to be a lot of drainage resulting in periwound tissue maceration. The calcaneal wound also looks similar, with heaped up macerated callus around the edges and a layer of slough on the surface. 05/27/2023: No real change to his wounds. He is still avoiding amputation. 06/10/2023: The dorsal foot wound actually looks a little bit better this week. There is  light slough on the surface. On the plantar heel wound, he has less wet callus accumulation. There is rubbery slough on the surface. Patient History Family History Cancer - Father,Mother, Diabetes - Mother,Father, Lung Disease - Father, Thyroid Problems - Mother, No family history of Heart Disease, Hereditary Spherocytosis, Hypertension, Kidney Disease, Seizures, Stroke, Tuberculosis. Social History Never smoker, Marital Status - Married, Alcohol Use - Never, Drug Use - No History, Caffeine Use - Daily. Medical History Eyes Patient has history of Cataracts Denies history of Glaucoma, Optic Neuritis Ear/Nose/Mouth/Throat Denies history of Chronic sinus problems/congestion, Middle ear problems Respiratory Patient has history of Sleep Apnea Cardiovascular Patient has history of Hypertension Gastrointestinal Denies history of Cirrhosis , Colitis, Crohns, Hepatitis A, Hepatitis B, Hepatitis C Endocrine Patient has history of Type II Diabetes Immunological Denies history of Lupus Erythematosus, Raynauds, Scleroderma Musculoskeletal Patient has history of Osteomyelitis - 2023 Neurologic Patient has history of Neuropathy - Bila lower extremities Oncologic Denies history of Received Chemotherapy, Received Radiation Psychiatric Denies history of Anorexia/bulimia, Confinement Anxiety Hospitalization/Surgery History - I and D Left calcaneus. - back surgery- laminectomy. - eye surgery- Bila cataracts. - shoulder arthroscopy. Medical A Surgical History Notes nd Cardiovascular hyperlipidemia Genitourinary AKI Objective Constitutional no acute distress. Vitals Time Taken: 10:14 AM, Height:  74 in, Weight: 245 lbs, BMI: 31.5, Temperature: 98.0 F, Pulse: 76 bpm, Respiratory Rate: 18 breaths/min, Blood Pressure: 134/70 mmHg, Capillary Blood Glucose: 238 mg/dl. Respiratory Normal work of breathing on room air. General Notes: 06/10/2023: The dorsal foot wound actually looks a little bit better  this week. There is light slough on the surface. On the plantar heel wound, he has less wet callus accumulation. There is rubbery slough on the surface. Integumentary (Hair, Skin) Wound #1 status is Open. Original cause of wound was Pressure Injury. The date acquired was: 03/09/2019. The wound has been in treatment 39 weeks. The wound is located on the Left Calcaneus. The wound measures 6cm length x 5.5cm width x 0.4cm depth; 25.918cm^2 area and 10.367cm^3 volume. There is Fat Layer (Subcutaneous Tissue) exposed. There is no tunneling or undermining noted. There is a large amount of serosanguineous drainage noted. The wound [name], [name] ([id number]) [id number] Page 10 of 14 margin is thickened. There is large (67-100%) pink, pale, hyper - granulation within the wound bed. There is a small (1-33%) amount of necrotic tissue within the wound bed. The periwound skin appearance had no abnormalities noted for color. The periwound skin appearance exhibited: Callus, Maceration. The periwound skin appearance did not exhibit: Dry/Scaly. Periwound temperature was noted as No Abnormality. Wound #2 status is Open. Original cause of wound was Pressure Injury. The date acquired was: 07/09/2022. The wound has been in treatment 39 weeks. The wound is located on the Left,Dorsal Foot. The wound measures 3cm length x 2.5cm width x 0.1cm depth; 5.89cm^2 area and 0.589cm^3 volume. There is Fat Layer (Subcutaneous Tissue) exposed. There is a medium amount of serosanguineous drainage noted. The wound margin is flat and intact. There is medium (34-66%) red granulation within the wound bed. There is a medium (34-66%) amount of necrotic tissue within the wound bed including Adherent Slough. The periwound skin appearance had no abnormalities noted for color. The periwound skin appearance exhibited: Excoriation, Scarring, Maceration. The periwound skin appearance did not exhibit: Dry/Scaly. Periwound  temperature was noted as No Abnormality. 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 There was a Selective/Open Wound Non-Viable Tissue Debridement with a total area of 25.9 sq cm performed by [name], MD. With the following instrument(s): Curette to remove Viable and Non-Viable tissue/material. Material removed includes Callus and Slough and after achieving pain control using Lidocaine 4% T opical Solution. No specimens were taken. A time out was conducted at 11:00, prior to the start of the procedure. A Minimum amount of bleeding was controlled with Pressure. The procedure was tolerated well with a pain level of 0 throughout and a pain level of 0 following the procedure. Post Debridement Measurements: 6cm length x 5.5cm width x 0.4cm depth; 10.367cm^3 volume. Character of Wound/Ulcer Post Debridement is improved. Severity of Tissue Post Debridement is: Fat layer exposed. Post procedure Diagnosis Wound #1: Same as Pre-Procedure General Notes: Scribed for Dr Lady Gary by [name] RN.. Pre-procedure diagnosis of Wound #1 is a Diabetic Wound/Ulcer of the Lower Extremity located on the Left Calcaneus . There was a Three  Layer Compression Therapy Procedure by [name], RN. Post procedure Diagnosis Wound #1: Same as Pre-Procedure Wound #2 Pre-procedure diagnosis of Wound #2 is a Diabetic Wound/Ulcer of the Lower Extremity located on the Left,Dorsal Foot .Severity of Tissue Pre Debridement is: Fat layer exposed. There  was a Selective/Open Wound Non-Viable Tissue Debridement with a total area of 5.89 sq cm performed by [name], MD. With the following instrument(s): Curette to remove Viable and Non-Viable tissue/material. Material removed includes Slough. No specimens were taken. A time out was conducted at 11:00, prior to the start of the procedure. A Minimum amount of bleeding was controlled with Pressure. The procedure was tolerated well with a pain level of 0 throughout and a pain level of 0 following the procedure. Post Debridement Measurements: 3cm length x 2.5cm width x 0.1cm depth; 0.589cm^3 volume. Character of Wound/Ulcer Post Debridement is improved. Severity of Tissue Post Debridement is: Fat layer exposed. Post procedure Diagnosis Wound #2: Same as Pre-Procedure General Notes: Scribed for Dr Lady Gary by [name] RN.. Pre-procedure diagnosis of Wound #2 is a Diabetic Wound/Ulcer of the Lower Extremity located on the Left,Dorsal Foot . There was a Three Layer Compression Therapy Procedure by [name], RN. Post procedure Diagnosis Wound #2: Same as Pre-Procedure Plan Follow-up Appointments: Return Appointment in 2 weeks. - Dr. Lady Gary Room 2 Anesthetic: Wound #1 Left Calcaneus: (In clinic) Topical Lidocaine 4% applied to wound bed Wound #2 Left,Dorsal Foot: (In clinic) Topical Lidocaine 4% applied to wound bed Bathing/ Shower/ Hygiene: May shower and wash wound with soap and water. - use antibacterial soap Edema Control - Lymphedema / SCD / Other: Elevate legs to the level of the heart or above for 30 minutes daily and/or when sitting for 3-4 times a day throughout the day. Avoid standing for long periods of time. Moisturize legs daily. Compression stocking or Garment 30-40 mm/Hg pressure to: - left leg daily Off-Loading: Wound #1 Left Calcaneus: Other: - use knee scooter to ambulate, minimal weight bearing right foot WOUND #1: - Calcaneus Wound Laterality: Left [name], [name] ([id number]) [id number] Page 11 of 14 Cleanser: Soap and Water 1 x Per Day/30 Days Discharge Instructions: May shower and wash wound with dial antibacterial soap and water prior to dressing change. Cleanser: Vashe 5.8 (oz) 1 x Per Day/30 Days Discharge Instructions: Cleanse the wound with Vashe prior to applying a clean dressing using gauze sponges, not tissue or cotton balls. Peri-Wound Care: Zinc Oxide Ointment 30g tube 1 x Per Day/30 Days Discharge Instructions: Apply Zinc Oxide to periwound with each dressing change Topical: Gentamicin 1 x Per Day/30 Days Discharge Instructions: Apply to wound when keystone not available Topical: Mupirocin Ointment 1 x Per Day/30 Days Discharge Instructions: when keystone not available apply Mupirocin (Bactroban) as instructed Topical: keystone 1 x Per Day/30 Days Prim Dressing: Hydrofera Blue Ready Transfer Foam, 4x5 (in/in) 1 x Per Day/30 Days ary Discharge Instructions: Apply to wound bed as instructed Secondary Dressing: Woven Gauze Sponge, Non-Sterile 4x4 in (Generic) 1 x Per Day/30 Days Discharge Instructions: Wet with Vashe we to dry. Secondary Dressing: Zetuvit Plus 4x8 in (Generic) 1 x Per Day/30 Days Discharge Instructions: Apply over primary dressing as directed. Secured With: Coban Self-Adherent Wrap 4x5 (in/yd) (Generic) 1 x Per Day/30 Days Discharge Instructions: Secure with Coban as directed. Secured With: [employer], 4.5x3.1 (in/yd) (Generic) 1 x Per Day/30 Days Discharge Instructions: Secure with Kerlix as directed. WOUND #2: - Foot Wound Laterality: Dorsal, Left Cleanser: Soap and Water 1  x Per Day/30 Days Discharge Instructions: May shower and wash wound with dial antibacterial soap and water prior to dressing change. Cleanser: Vashe 5.8 (oz) 1 x Per Day/30 Days Discharge Instructions: Cleanse the wound with Vashe prior to applying a clean dressing using gauze sponges, not tissue or cotton  balls. Peri-Wound Care: Zinc Oxide Ointment 30g tube 1 x Per Day/30 Days Discharge Instructions: Apply Zinc Oxide to periwound with each dressing change Topical: Gentamicin 1 x Per Day/30 Days Discharge Instructions: Apply to wound when keystone not available Topical: Mupirocin Ointment 1 x Per Day/30 Days Discharge Instructions: when keystone not available apply Mupirocin (Bactroban) as instructed Topical: keystone 1 x Per Day/30 Days Prim Dressing: Promogran Prisma Matrix, 4.34 (sq in) (silver collagen) 1 x Per Day/30 Days ary Discharge Instructions: Moisten collagen with saline or hydrogel Prim Dressing: Hydrofera Blue Ready Transfer Foam, 2.5x2.5 (in/in) 1 x Per Day/30 Days ary Discharge Instructions: Apply directly to wound bed as directed Secondary Dressing: Woven Gauze Sponge, Non-Sterile 4x4 in (Generic) 1 x Per Day/30 Days Discharge Instructions: Wet with Vashe we to dry. Secondary Dressing: Zetuvit Plus 4x8 in (Generic) 1 x Per Day/30 Days Discharge Instructions: Apply over primary dressing as directed. Secured With: Coban Self-Adherent Wrap 4x5 (in/yd) (Generic) 1 x Per Day/30 Days Discharge Instructions: Secure with Coban as directed. Secured With: [employer], 4.5x3.1 (in/yd) (Generic) 1 x Per Day/30 Days Discharge Instructions: Secure with Kerlix as directed. 06/10/2023: The dorsal foot wound actually looks a little bit better this week. There is light slough on the surface. On the plantar heel wound, he has less wet callus accumulation. There is rubbery slough on the surface. I used a curette to debride slough off of the dorsal foot wound, slough and callus from the plantar heel wound. We will continue the mixture of topical gentamicin and mupirocin to both sites with Prisma silver collagen on the dorsal foot and Hydrofera Blue on the heel. He understands that ultimately he will require amputation but is trying to stave it off as long as possible. Follow-up in 2  weeks. Electronic Signature(s) Signed: 06/10/2023 11:23:00 AM By: [name] MD FACS Entered By: [name] on 06/10/2023 11:23:00 -------------------------------------------------------------------------------- HxROS Details Patient Name: Date of Service: [name], [name] 06/10/2023 10:15 A M Medical Record Number: [id number] Patient Account Number: [id number] Date of Birth/Sex: Treating RN: November 21, 1956 (67 y.o. M) Primary Care Provider: Foye [name] Other Clinician: Referring Provider: Treating Provider/Extender: Kandis Cocking in Treatment: 39 Eyes Medical History: Positive for: Cataracts Negative for: Glaucoma; Optic Neuritis [name], [name] ([id number]) [id number] Page 12 of 14 Ear/Nose/Mouth/Throat Medical History: Negative for: Chronic sinus problems/congestion; Middle ear problems Respiratory Medical History: Positive for: Sleep Apnea Cardiovascular Medical History: Positive for: Hypertension Past Medical History Notes: hyperlipidemia Gastrointestinal Medical History: Negative for: Cirrhosis ; Colitis; Crohns; Hepatitis A; Hepatitis B; Hepatitis C Endocrine Medical History: Positive for: Type II Diabetes Time with diabetes: 20 years Treated with: Insulin Blood sugar tested every day: Yes Tested : 2-3 Genitourinary Medical History: Past Medical History Notes: AKI Immunological Medical History: Negative for: Lupus Erythematosus; Raynauds; Scleroderma Musculoskeletal Medical History: Positive for: Osteomyelitis - 2023 Neurologic Medical History: Positive for: Neuropathy - Bila lower extremities Oncologic Medical History: Negative for: Received Chemotherapy; Received Radiation Psychiatric Medical History: Negative for: Anorexia/bulimia; Confinement Anxiety HBO Extended History Items Eyes: Cataracts Immunizations Pneumococcal Vaccine: Received Pneumococcal Vaccination: Yes Received  Pneumococcal Vaccination On or After 60th Birthday: Yes Implantable Devices Yes Hospitalization / Surgery History Type  of Hospitalization/Surgery I and D Left calcaneus [name], [name] ([id number]) [id number] Page 13 of 14 back surgery- laminectomy eye surgery- Bila cataracts shoulder arthroscopy Family and Social History Cancer: Yes - Father,Mother; Diabetes: Yes - Mother,Father; Heart Disease: No; Hereditary Spherocytosis: No; Hypertension: No; Kidney Disease: No; Lung Disease: Yes - Father; Seizures: No; Stroke: No; Thyroid Problems: Yes - Mother; Tuberculosis: No; Never smoker; Marital Status - Married; Alcohol Use: Never; Drug Use: No History; Caffeine Use: Daily; Financial Concerns: No; Food, Clothing or Shelter Needs: No; Support System Lacking: No; Transportation Concerns: No Electronic Signature(s) Signed: 06/10/2023 11:24:38 AM By: [name] MD FACS Entered By: [name] on 06/10/2023 11:17:57 -------------------------------------------------------------------------------- SuperBill Details Patient Name: Date of Service: [name], [name] 06/10/2023 Medical Record Number: [id number] Patient Account Number: [id number] Date of Birth/Sex: Treating RN: 09-24-1956 (67 y.o. 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, left ankle and foot E11.65 Type 2 diabetes mellitus with hyperglycemia E11.621 Type 2 diabetes mellitus with foot ulcer Facility Procedures : The patient participates with Medicare or their insurance follows the Medicare Facility Guidelines:  CPT4 Code Description Modifier Quantity [id number] 1725 - DEBRIDE WOUND 1ST 20 SQ CM OR < 1 ICD-10 Diagnosis Description L97.424 Non-pressure chronic ulcer  of left heel and midfoot with necrosis of bone L97.523 Non-pressure chronic ulcer of other part of left foot with necrosis of muscle : The patient participates with Medicare or their insurance follows the Medicare Facility Guidelines: [id number] - DEBRIDE WOUND EA ADDL 20 SQ CM 1 ICD-10 Diagnosis Description 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 L97.523 Non-pressure chronic ulcer of other part of left foot with necrosis of muscle Physician Procedures : CPT4 Code Description Modifier [id number] - WC PHYS LEVEL 4 - EST PT 25 ICD-10 Diagnosis Description 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, 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 Quantity: 1 : [id number] - WC PHYS DEBR WO ANESTH 20 SQ CM ICD-10 Diagnosis Description 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

## 2023-06-13 NOTE — Progress Notes (Signed)
CALEM, BANFIELD ([id number]) [id number] Page 1 of 10 Visit Report for 06/10/2023 Arrival Information Details Patient Name: Date of Service: [name], [name] 06/10/2023 10:15 A M Medical Record Number: [id number] Patient Account Number: [id number] Date of Birth/Sex: Treating RN: 28-Jul-1956 (67 y.o. M) Primary Care Taralee Marcus: Foye [name] Other Clinician: Referring Kylan Liberati: Treating Chiquita Heckert/Extender: Kandis Cocking in Treatment: 39 Visit Information History Since Last Visit All ordered tests and consults were completed: No Patient Arrived: Knee Scooter Added or deleted any medications: No Arrival Time: 10:14 Any new allergies or adverse reactions: No Accompanied By: self Had a fall or experienced change in No Transfer Assistance: None activities of daily living that may affect Patient Identification Verified: Yes risk of falls: Secondary Verification Process Completed: Yes Signs or symptoms of abuse/neglect since last visito No Patient Requires Transmission-Based Precautions: No Hospitalized since last visit: No Patient Has Alerts: No Implantable device outside of the clinic excluding No cellular tissue based products placed in the center since last visit: Pain Present Now: No Electronic Signature(s) Signed: 06/10/2023 3:04:54 PM By: Dayton Scrape Entered By: Dayton Scrape on 06/10/2023 10:14:28 -------------------------------------------------------------------------------- Compression Therapy Details Patient Name: Date of Service: [name], [name]. 06/10/2023 10:15 A M Medical Record Number: [id number] Patient Account Number: [id number] Date of Birth/Sex: Treating RN: 1956-02-25 (67 y.o. [name] Primary Care Shenicka Sunderlin: Foye [name] Other Clinician: Referring Laritza Vokes: Treating Kloe Oates/Extender: Kandis Cocking in Treatment: 39 Compression Therapy Performed for Wound Assessment: Wound  #1 Left Calcaneus Performed By: Clinician [name], RN Compression Type: Three Layer Post Procedure Diagnosis Same as Pre-procedure Electronic Signature(s) Signed: 06/13/2023 2:57:52 PM By: [name] Entered By: [name] on 06/10/2023 11:04:45 [name], [name] ([id number]) [id number] Page 2 of 10 -------------------------------------------------------------------------------- Compression Therapy Details Patient Name: Date of Service: [name], [name] 06/10/2023 10:15 A M Medical Record Number: [id number] Patient Account Number: [id number] Date of Birth/Sex: Treating RN: 1956/06/10 (67 y.o. [name] Primary Care Akirra Lacerda: Foye [name] Other Clinician: Referring Darryl Willner: Treating Shayma Pfefferle/Extender: Kandis Cocking in Treatment: 39 Compression Therapy Performed for Wound Assessment: Wound #2 Left,Dorsal Foot Performed By: Leighton Parody, RN Compression Type: Three Layer Post Procedure Diagnosis Same as Pre-procedure Electronic Signature(s) Signed: 06/13/2023 2:57:52 PM By: [name] Entered By: [name] on 06/10/2023 11:04:45 -------------------------------------------------------------------------------- Encounter Discharge Information Details Patient Name: Date of Service: [name], [name]. 06/10/2023 10:15 A M Medical Record Number: [id number] Patient Account Number: [id number] Date of Birth/Sex: Treating RN: May 18, 1956 (67 y.o. [name] Primary Care Caterra Ostroff: Foye [name] Other Clinician: Referring Keyshawna Prouse: Treating Marsha Hillman/Extender: Kandis Cocking in Treatment: 39 Encounter Discharge Information Items Post Procedure Vitals Discharge Condition: Stable Temperature (F): 98.3 Ambulatory Status: Knee Scooter Pulse (bpm): 78 Discharge Destination: Home Respiratory Rate (breaths/min): 18 Transportation: Private Auto Blood Pressure (mmHg):  142/73 Accompanied By: self Schedule Follow-up Appointment: Yes Clinical Summary of Care: Patient Declined Electronic Signature(s) Signed: 06/13/2023 2:57:52 PM By: [name] Entered By: [name] on 06/10/2023 11:25:35 -------------------------------------------------------------------------------- Lower Extremity Assessment Details Patient Name: Date of Service: [name], [name]. 06/10/2023 10:15 A M Medical Record Number: [id number] Patient Account Number: [id number] Date of Birth/Sex: Treating RN: 06/21/1956 (67 y.o. [name] Primary Care Daquana Paddock: Foye [name] Other Clinician: Referring Basia Mcginty: Treating Rina Adney/Extender: Kayleen Memos Weeks in Treatment: 39 Edema Assessment Assessed: Kyra Searles: No] [name]: No] Edema: Kyra Searles: Ye] [Right: s] [address] [place], MATSEN [name] ([id number]) [id number] Page 3 of  10 Left: Right: Point of Measurement: From Medial Instep 47.2 cm Ankle Left: Right: Point of Measurement: From Medial Instep 25 cm Vascular Assessment Pulses: Dorsalis Pedis Palpable: [Left:Yes] Electronic Signature(s) Signed: 06/13/2023 2:57:52 PM By: [name] Entered By: [name] on 06/10/2023 10:46:01 -------------------------------------------------------------------------------- Multi Wound Chart Details Patient Name: Date of Service: [name], [name]. 06/10/2023 10:15 A M Medical Record Number: [id number] Patient Account Number: [id number] Date of Birth/Sex: Treating RN: 01-15-1956 (67 y.o. M) Primary Care Jakerra Floyd: Foye [name] Other Clinician: Referring Chaniah Cisse: Treating Antonios Ostrow/Extender: Kandis Cocking in Treatment: 39 Vital Signs Height(in): 74 Capillary Blood Glucose(mg/dl): 409 Weight(lbs): 811 Pulse(bpm): 76 Body Mass Index(BMI): 31.5 Blood Pressure(mmHg): 134/70 Temperature(F): 98.0 Respiratory Rate(breaths/min): 18 [1:Photos:] [N/A:N/A] Left  Calcaneus Left, Dorsal Foot N/A Wound Location: Pressure Injury Pressure Injury N/A Wounding Event: Diabetic Wound/Ulcer of the Lower Diabetic Wound/Ulcer of the Lower N/A Primary Etiology: Extremity Extremity Cataracts, Sleep Apnea, Hypertension, Cataracts, Sleep Apnea, Hypertension, N/A Comorbid History: Type II Diabetes, Osteomyelitis, Type II Diabetes, Osteomyelitis, Neuropathy Neuropathy 03/09/2019 07/09/2022 N/A Date Acquired: 84 39 N/A Weeks of Treatment: Open Open N/A Wound Status: No No N/A Wound Recurrence: 6x5.5x0.4 3x2.5x0.1 N/A Measurements L x W x D (cm) 25.918 5.89 N/A A (cm) : rea 10.367 0.589 N/A Volume (cm) : -20.00% 37.50% N/A % Reduction in A rea: 52.00% 37.50% N/A % Reduction in Volume: Grade 3 Grade 2 N/A Classification: Large Medium N/A Exudate A mount: Serosanguineous Serosanguineous N/A Exudate Type: red, brown red, brown N/A Exudate Color: Thickened Flat and Intact N/A Wound Margin: Large (67-100%) Medium (34-66%) N/A Granulation A mount: Pink, Pale, Hyper-granulation Red N/A Granulation QualityREILEY, [name] ([id number]) [id number] Page 4 of 10 Small (1-33%) Medium (34-66%) N/A Necrotic Amount: Fat Layer (Subcutaneous Tissue): Yes Fat Layer (Subcutaneous Tissue): Yes N/A Exposed Structures: Fascia: No Fascia: No Tendon: No Tendon: No Muscle: No Muscle: No Joint: No Joint: No Bone: No Bone: No Small (1-33%) Small (1-33%) N/A Epithelialization: Debridement - Selective/Open Wound Debridement - Selective/Open Wound N/A Debridement: Pre-procedure Verification/Time Out 11:00 11:00 N/A Taken: Lidocaine 4% T opical Solution N/A N/A Pain Control: Callus, Slough N/A N/A Tissue Debrided: Non-Viable Tissue Non-Viable Tissue N/A Level: 25.9 5.89 N/A Debridement A (sq cm): rea Curette Curette N/A Instrument: Minimum Minimum N/A Bleeding: Pressure Pressure N/A Hemostasis A chieved: 0 0  N/A Procedural Pain: 0 0 N/A Post Procedural Pain: Procedure was tolerated well Procedure was tolerated well N/A Debridement Treatment Response: 6x5.5x0.4 3x2.5x0.1 N/A Post Debridement Measurements L x W x D (cm) 10.367 0.589 N/A Post Debridement Volume: (cm) Callus: Yes Excoriation: Yes N/A Periwound Skin Texture: Scarring: Yes Maceration: Yes Maceration: Yes N/A Periwound Skin Moisture: Dry/Scaly: No Dry/Scaly: No Erythema: No Rubor: No N/A Periwound Skin Color: Rubor: No No Abnormality No Abnormality N/A Temperature: Compression Therapy Compression Therapy N/A Procedures Performed: Debridement Debridement Treatment Notes Electronic Signature(s) Signed: 06/10/2023 11:14:13 AM By: [name] MD FACS Entered By: [name] on 06/10/2023 11:14:13 -------------------------------------------------------------------------------- Multi-Disciplinary Care Plan Details Patient Name: Date of Service: [place], [name]. 06/10/2023 10:15 A M Medical Record Number: [id number] Patient Account Number: [id number] Date of Birth/Sex: Treating RN: June 09, 1956 (67 y.o. [name] Primary Care Grizelda Piscopo: Foye [name] Other Clinician: Referring Tidus Upchurch: Treating Redonna Wilbert/Extender: Kandis Cocking in Treatment: 39 Multidisciplinary Care Plan reviewed with physician Active Inactive Nutrition Nursing Diagnoses: Imbalanced nutrition Impaired glucose control: actual or potential Potential for alteratiion in Nutrition/Potential for imbalanced nutrition Goals: Patient/caregiver will maintain therapeutic glucose control Date  Initiated: 05/27/2023 Target Resolution Date: 06/24/2023 Goal Status: Active Interventions: Assess HgA1c results as ordered upon admission and as needed Assess patient nutrition upon admission and as needed per policy Provide education on elevated blood sugars and impact on wound healing [name], [name] ([id number])  [phone].pdf Page 5 of 10 Treatment Activities: Patient referred to Primary Care Physician for further nutritional evaluation : 05/27/2023 Notes: Wound/Skin Impairment Nursing Diagnoses: Impaired tissue integrity Goals: Patient/caregiver will verbalize understanding of skin care regimen Date Initiated: 10/06/2022 Target Resolution Date: 07/13/2023 Goal Status: Active Ulcer/skin breakdown will have a volume reduction of 30% by week 4 Date Initiated: 09/10/2022 Date Inactivated: 10/06/2022 Target Resolution Date: 10/08/2022 Goal Status: Unmet Unmet Reason: osteo, HBOT Ulcer/skin breakdown will have a volume reduction of 50% by week 8 Date Initiated: 10/06/2022 Date Inactivated: 02/14/2023 Target Resolution Date: 01/14/2023 Unmet Reason: insufficient perfusion Goal Status: Unmet to Left lower extremity Ulcer/skin breakdown will have a volume reduction of 80% by week 12 Date Initiated: 02/14/2023 Date Inactivated: 03/14/2023 Target Resolution Date: 03/15/2023 Goal Status: Unmet Unmet Reason: osteomyelitis Interventions: Assess ulceration(s) every visit Provide education on ulcer and skin care Treatment Activities: Consult for HBO : 09/10/2022 Skin care regimen initiated : 09/10/2022 Notes: NPWT started 12/27/22 Electronic Signature(s) Signed: 06/13/2023 2:57:52 PM By: [name] Entered By: [name] on 06/10/2023 10:49:55 -------------------------------------------------------------------------------- Pain Assessment Details Patient Name: Date of Service: [place], [name]. 06/10/2023 10:15 A M Medical Record Number: [id number] Patient Account Number: [id number] Date of Birth/Sex: Treating RN: 1956-09-11 (67 y.o. M) Primary Care Aysia Lowder: Foye [name] Other Clinician: Referring Imani Sherrin: Treating Riah Kehoe/Extender: Kandis Cocking in Treatment: 39 Active Problems Location of Pain Severity and Description of Pain Patient Has  Paino No Site Locations [place], [place] ([id number]) [id number] Page 6 of 10 Pain Management and Medication Current Pain Management: Electronic Signature(s) Signed: 06/10/2023 3:04:54 PM By: Dayton Scrape Entered By: Dayton Scrape on 06/10/2023 10:15:10 -------------------------------------------------------------------------------- Patient/Caregiver Education Details Patient Name: Date of Service: [place], [name]. 6/28/2024andnbsp10:15 A M Medical Record Number: [id number] Patient Account Number: [id number] Date of Birth/Gender: Treating RN: 04/30/1956 (67 y.o. [name] Primary Care Physician: Foye [name] Other Clinician: Referring Physician: Treating Physician/Extender: Kandis Cocking in Treatment: 55 Education Assessment Education Provided To: Patient Education Topics Provided Wound/Skin Impairment: Methods: Explain/Verbal Responses: State content correctly Electronic Signature(s) Signed: 06/13/2023 2:57:52 PM By: [name] Entered By: [name] on 06/10/2023 10:50:42 -------------------------------------------------------------------------------- Wound Assessment Details Patient Name: Date of Service: [place], [name]. 06/10/2023 10:15 A M Medical Record Number: [id number] Patient Account Number: [id number] Date of Birth/Sex: Treating RN: [birth year] (67 y.o. [name] Primary Care Ladrea Holladay: Foye [name] Other Clinician: Referring Cason Dabney: Treating Makenzey Nanni/Extender: Kayleen Memos [place], Windy Fast [name] ([id number]) [id number] Page 7 of 10 Weeks in Treatment: 39 Wound Status Wound Number: 1 Primary Diabetic Wound/Ulcer of the Lower Extremity Etiology: Wound Location: Left Calcaneus Wound Open Wounding Event: Pressure Injury Status: Date Acquired: 03/09/2019 Comorbid Cataracts, Sleep Apnea, Hypertension, Type II Diabetes, Weeks Of Treatment:  39 History: Osteomyelitis, Neuropathy Clustered Wound: No Photos Wound Measurements Length: (cm) 6 Width: (cm) 5.5 Depth: (cm) 0.4 Area: (cm) 25.918 Volume: (cm) 10.367 % Reduction in Area: -20% % Reduction in Volume: 52% Epithelialization: Small (1-33%) Tunneling: No Undermining: No Wound Description Classification: Grade 3 Wound Margin: Thickened Exudate Amount: Large Exudate Type: Serosanguineous Exudate Color: red, brown Foul Odor After Cleansing: No Slough/Fibrino Yes Wound Bed Granulation Amount: Large (67-100%) Exposed Structure Granulation Quality: Pink,  Pale, Hyper-granulation Fascia Exposed: No Necrotic Amount: Small (1-33%) Fat Layer (Subcutaneous Tissue) Exposed: Yes Tendon Exposed: No Muscle Exposed: No Joint Exposed: No Bone Exposed: No Periwound Skin Texture Texture Color No Abnormalities Noted: No No Abnormalities Noted: Yes Callus: Yes Temperature / Pain Temperature: No Abnormality Moisture No Abnormalities Noted: No Dry / Scaly: No Maceration: Yes Treatment Notes Wound #1 (Calcaneus) Wound Laterality: Left Cleanser Soap and Water Discharge Instruction: May shower and wash wound with dial antibacterial soap and water prior to dressing change. Vashe 5.8 (oz) Discharge Instruction: Cleanse the wound with Vashe prior to applying a clean dressing using gauze sponges, not tissue or cotton balls. Peri-Wound Care Zinc Oxide Ointment 30g tube Discharge Instruction: Apply Zinc Oxide to periwound with each dressing change Topical Gentamicin TYDRICK, [name] ([id number]) [phone].pdf Page 8 of 10 Discharge Instruction: Apply to wound when keystone not available Mupirocin Ointment Discharge Instruction: when [facility] not available apply Mupirocin (Bactroban) as instructed keystone Primary Dressing Hydrofera Blue Ready Transfer Foam, 4x5 (in/in) Discharge Instruction: Apply to wound bed as instructed Secondary Dressing Woven  Gauze Sponge, Non-Sterile 4x4 in Discharge Instruction: Wet with Vashe we to dry. Zetuvit Plus 4x8 in Discharge Instruction: Apply over primary dressing as directed. Secured With [employer] 4x5 (in/yd) Discharge Instruction: Secure with Coban as directed. Kerlix Roll Sterile, 4.5x3.1 (in/yd) Discharge Instruction: Secure with Kerlix as directed. Compression Wrap Compression Stockings Add-Ons Electronic Signature(s) Signed: 06/13/2023 2:57:52 PM By: [name] Entered By: [name] on 06/10/2023 10:57:45 -------------------------------------------------------------------------------- Wound Assessment Details Patient Name: Date of Service: [name], [name]. 06/10/2023 10:15 A M Medical Record Number: [id number] Patient Account Number: [id number] Date of Birth/Sex: Treating RN: Apr 27, 1956 (67 y.o. M) Primary Care Kmya Placide: Foye [name] Other Clinician: Referring Lolitha Tortora: Treating Genee Rann/Extender: Kandis Cocking in Treatment: 39 Wound Status Wound Number: 2 Primary Diabetic Wound/Ulcer of the Lower Extremity Etiology: Wound Location: Left, Dorsal Foot Wound Open Wounding Event: Pressure Injury Status: Date Acquired: 07/09/2022 Comorbid Cataracts, Sleep Apnea, Hypertension, Type II Diabetes, Weeks Of Treatment: 39 History: Osteomyelitis, Neuropathy Clustered Wound: No Photos Wound Measurements Length: (cm) 3 Width: (cm) 2.5 Depth: (cm) 0.1 [name], [name] ([id number]) Area: (cm) 5.89 Volume: (cm) 0.589 % Reduction in Area: 37.5% % Reduction in Volume: 37.5% Epithelialization: Small (1-33%) [phone].pdf Page 9 of 10 Wound Description Classification: Grade 2 Wound Margin: Flat and Intact Exudate Amount: Medium Exudate Type: Serosanguineous Exudate Color: red, brown Foul Odor After Cleansing: No Slough/Fibrino Yes Wound Bed Granulation Amount: Medium (34-66%) Exposed Structure Granulation  Quality: Red Fascia Exposed: No Necrotic Amount: Medium (34-66%) Fat Layer (Subcutaneous Tissue) Exposed: Yes Necrotic Quality: Adherent Slough Tendon Exposed: No Muscle Exposed: No Joint Exposed: No Bone Exposed: No Periwound Skin Texture Texture Color No Abnormalities Noted: No No Abnormalities Noted: Yes Excoriation: Yes Temperature / Pain Scarring: Yes Temperature: No Abnormality Moisture No Abnormalities Noted: No Dry / Scaly: No Maceration: Yes Treatment Notes Wound #2 (Foot) Wound Laterality: Dorsal, Left Cleanser Soap and Water Discharge Instruction: May shower and wash wound with dial antibacterial soap and water prior to dressing change. Vashe 5.8 (oz) Discharge Instruction: Cleanse the wound with Vashe prior to applying a clean dressing using gauze sponges, not tissue or cotton balls. Peri-Wound Care Zinc Oxide Ointment 30g tube Discharge Instruction: Apply Zinc Oxide to periwound with each dressing change Topical Gentamicin Discharge Instruction: Apply to wound when keystone not available Mupirocin Ointment Discharge Instruction: when keystone not available apply Mupirocin (Bactroban) as instructed keystone Primary Dressing Promogran Prisma  Matrix, 4.34 (sq in) (silver collagen) Discharge Instruction: Moisten collagen with saline or hydrogel Hydrofera Blue Ready Transfer Foam, 2.5x2.5 (in/in) Discharge Instruction: Apply directly to wound bed as directed Secondary Dressing Woven Gauze Sponge, Non-Sterile 4x4 in Discharge Instruction: Wet with Vashe we to dry. Zetuvit Plus 4x8 in Discharge Instruction: Apply over primary dressing as directed. Secured With [employer] 4x5 (in/yd) Discharge Instruction: Secure with Coban as directed. Kerlix Roll Sterile, 4.5x3.1 (in/yd) Discharge Instruction: Secure with Kerlix as directed. Compression Wrap Compression Stockings [name], [name] ([id number]) [id number] Page 10 of  10 Add-Ons Electronic Signature(s) Signed: 06/10/2023 3:04:54 PM By: Dayton Scrape Entered By: Dayton Scrape on 06/10/2023 10:29:38 -------------------------------------------------------------------------------- Vitals Details Patient Name: Date of Service: [name], [name]. 06/10/2023 10:15 A M Medical Record Number: [id number] Patient Account Number: [id number] Date of Birth/Sex: Treating RN: [birth year] (67 y.o. M) Primary Care Deonne Rooks: Foye [name] Other Clinician: Referring Teirra Carapia: Treating Martice Doty/Extender: Kandis Cocking in Treatment: 39 Vital Signs Time Taken: 10:14 Temperature (F): 98.0 Height (in): 74 Pulse (bpm): 76 Weight (lbs): 245 Respiratory Rate (breaths/min): 18 Body Mass Index (BMI): 31.5 Blood Pressure (mmHg): 134/70 Capillary Blood Glucose (mg/dl): 366 Reference Range: 80 - 120 mg / dl Electronic Signature(s) Signed: 06/10/2023 3:04:54 PM By: Dayton Scrape Entered By: Dayton Scrape on 06/10/2023 10:15:04

## 2023-06-15 ENCOUNTER — Ambulatory Visit: Payer: Self-pay | Admitting: *Deleted

## 2023-06-15 NOTE — Patient Instructions (Signed)
Visit Information  Thank you for taking time to visit with me today. Please don't hesitate to contact me if I can be of assistance to you.   Following are the goals we discussed today:   Goals Addressed             This Visit's Progress    Planning for care needs       Activities and task to complete in order to accomplish goals.   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 either in-person or online   Start / continue healthy coping activities including relaxed breathing, mindfulness techniques, etc (will email some info to you)  Keep all upcoming appointments Continue with compliance of taking medication prescribed by Doctor Glad you enjoyed your first meet up/lunch with your 2 peer support men-  Make list of questions/concerns, etc to take with you to your next meet up wit them   Await call from [facility]- for hopeful help with providing assistance with light housekeeping, laundry, errands and some personal care  Consider private pay home care options if needed/desired to help with wife's care needs while you are hospitalized and rehabilitating Review insurance policy to see if you have Long-term care insurance or a custodial care benefit Complete Advance Directive packet,  Have advance directive notarized and provide a copy to provider office  Follow up on things we discussed today regarding concerns related to spouse Congrats on the part-time job!         Our next appointment is by telephone on 06/28/23  Please call the care guide team at [phone] if you need to cancel or reschedule your appointment.   If you are experiencing a Mental Health or Behavioral Health Crisis or need someone to talk to, please call the Suicide and Crisis Lifeline: 988 call 911   The patient verbalized  understanding of instructions, educational materials, and care plan provided today and DECLINED offer to receive copy of patient instructions, educational materials, and care plan.   Telephone follow up appointment with care management team member scheduled for: 06/28/23  Reece Levy, MSW, LCSW Clinical Social Worker [employer] [employer] [phone]

## 2023-06-15 NOTE — Patient Outreach (Signed)
  Care Coordination   Follow Up Visit Note   06/15/2023 Name: [name] MRN: [id number] DOB: [birth year]  [name] is a 67 y.o. year old male who sees [name], MD for primary care. I spoke with  [name] by phone today.  What matters to the patients health and wellness today?  Started a part time job. Wife is home and beginning home health care today.    Goals Addressed             This Visit's Progress    Planning for care needs       Activities and task to complete in order to accomplish goals.   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 either in-person or online   Start / continue healthy coping activities including relaxed breathing, mindfulness techniques, etc (will email some info to you)  Keep all upcoming appointments Continue with compliance of taking medication prescribed by Doctor Glad you enjoyed your first meet up/lunch with your 2 peer support men-  Make list of questions/concerns, etc to take with you to your next meet up wit them   Await call from [facility]- for hopeful help with providing assistance with light housekeeping, laundry, errands and some personal care  Consider private pay home care options if needed/desired to help with wife's care needs while you are hospitalized and rehabilitating Review insurance policy to see if you have Long-term care insurance or a custodial care benefit Complete Advance Directive packet,  Have advance directive notarized and provide a copy to provider office  Follow up on things we discussed today regarding concerns related to spouse Congrats on the part-time job!         SDOH assessments and interventions completed:  Yes     Care Coordination Interventions:  Yes, provided  Interventions Today     Flowsheet Row Most Recent Value  Chronic Disease   Chronic disease during today's visit Other  [depression/stress]  Education Interventions   Education Provided Provided Education  Provided Verbal Education On Mental Health/Coping with Illness, [employer], Other  [Provided insight to pt about concerns related to wife's medical/mental health state- discussed and encouraged follow up with her PCP, HH team, etc]  Mental Health Interventions   Mental Health Discussed/Reviewed Mental Health Discussed, Coping Strategies  [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 Visit Completed

## 2023-06-24 ENCOUNTER — Encounter (HOSPITAL_BASED_OUTPATIENT_CLINIC_OR_DEPARTMENT_OTHER): Payer: Medicare Other | Attending: General Surgery | Admitting: General Surgery

## 2023-06-24 DIAGNOSIS — M86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, left ankle and foot: Secondary | ICD-10-CM | POA: Diagnosis not present

## 2023-06-24 DIAGNOSIS — L97424 Non-pressure chronic ulcer of left heel and midfoot with necrosis of bone: Secondary | ICD-10-CM | POA: Insufficient documentation

## 2023-06-24 DIAGNOSIS — L84 Corns and callosities: Secondary | ICD-10-CM | POA: Insufficient documentation

## 2023-06-24 DIAGNOSIS — E1140 Type 2 diabetes mellitus with diabetic neuropathy, unspecified: Secondary | ICD-10-CM | POA: Insufficient documentation

## 2023-06-24 DIAGNOSIS — E11621 Type 2 diabetes mellitus with foot ulcer: Secondary | ICD-10-CM | POA: Insufficient documentation

## 2023-06-24 DIAGNOSIS — L97523 Non-pressure chronic ulcer of other part of left foot with necrosis of muscle: Secondary | ICD-10-CM | POA: Diagnosis not present

## 2023-06-24 DIAGNOSIS — E1169 Type 2 diabetes mellitus with other specified complication: Secondary | ICD-10-CM | POA: Diagnosis not present

## 2023-06-24 DIAGNOSIS — E1165 Type 2 diabetes mellitus with hyperglycemia: Secondary | ICD-10-CM | POA: Diagnosis not present

## 2023-06-24 NOTE — Progress Notes (Signed)
[name], VERVILLE [name] ([id number]) [id number] Page 1 of 9 Visit Report for 06/24/2023 Arrival Information Details Patient Name: Date of Service: [place], [name] 06/24/2023 10:15 A M Medical Record Number: [id number] Patient Account Number: [id number] Date of Birth/Sex: Treating RN: 1956/08/06 (67 y.o. Bayard Hugger, Bonita Quin Primary Care Trashawn Oquendo: Foye [name] Other Clinician: Referring Alphonsus Doyel: Treating Ashlon Lottman/Extender: Kandis Cocking in Treatment: 45 Visit Information History Since Last Visit Added or deleted any medications: No Patient Arrived: Ambulatory Any new allergies or adverse reactions: No Arrival Time: 10:13 Had a fall or experienced change in No Accompanied By: self activities of daily living that may affect Transfer Assistance: None risk of falls: Patient Identification Verified: Yes Signs or symptoms of abuse/neglect since last visito No Secondary Verification Process Completed: Yes Hospitalized since last visit: No Patient Requires Transmission-Based Precautions: No Implantable device outside of the clinic excluding No Patient Has Alerts: No cellular tissue based products placed in the center since last visit: Has Dressing in Place as Prescribed: Yes Pain Present Now: No Electronic Signature(s) Signed: 06/24/2023 12:03:07 PM By: Zenaida Deed RN, BSN Entered By: Zenaida Deed on 06/24/2023 10:17:09 -------------------------------------------------------------------------------- Encounter Discharge Information Details Patient Name: Date of Service: [name], [name] 06/24/2023 10:15 A M Medical Record Number: [id number] Patient Account Number: [id number] Date of Birth/Sex: Treating RN: Sep 15, 1956 (67 y.o. [name] Primary Care Cisco Kindt: Foye [name] Other Clinician: Referring Attilio Zeitler: Treating Jaleena Viviani/Extender: Kandis Cocking in Treatment: 50 Encounter Discharge  Information Items Post Procedure Vitals Discharge Condition: Stable Temperature (F): 98.1 Ambulatory Status: Ambulatory Pulse (bpm): 81 Discharge Destination: Home Respiratory Rate (breaths/min): 18 Transportation: Private Auto Blood Pressure (mmHg): 133/71 Accompanied By: self Schedule Follow-up Appointment: Yes Clinical Summary of Care: Patient Declined Electronic Signature(s) Signed: 06/24/2023 12:03:07 PM By: Zenaida Deed RN, BSN Entered By: Zenaida Deed on 06/24/2023 11:05:51 [name] ([id number]) [id number] Page 2 of 9 -------------------------------------------------------------------------------- Lower Extremity Assessment Details Patient Name: Date of Service: [place], [name] 06/24/2023 10:15 A M Medical Record Number: [id number] Patient Account Number: [id number] Date of Birth/Sex: Treating RN: 01/24/1956 (67 y.o. [name] Primary Care Kieran Arreguin: Foye [name] Other Clinician: Referring Lennan Malone: Treating Kamilia Carollo/Extender: Kandis Cocking in Treatment: 41 Edema Assessment Assessed: Kyra Searles: No] [name]: No] Edema: [Left: Ye] [Right: s] Calf Left: Right: Point of Measurement: From Medial Instep 47.2 cm Ankle Left: Right: Point of Measurement: From Medial Instep 25 cm Vascular Assessment Pulses: Dorsalis Pedis Palpable: [Left:Yes] Extremity colors, hair growth, and conditions: Extremity Color: [Left:Hyperpigmented] Hair Growth on Extremity: [Left:No] Temperature of Extremity: [Left:Warm < 3 seconds] Electronic Signature(s) Signed: 06/24/2023 12:03:07 PM By: Zenaida Deed RN, BSN Entered By: Zenaida Deed on 06/24/2023 10:24:50 -------------------------------------------------------------------------------- Multi Wound Chart Details Patient Name: Date of Service: [name], [name] 06/24/2023 10:15 A M Medical Record Number: [id number] Patient Account Number: [id number] Date of  Birth/Sex: Treating RN: Dec 23, 1955 (67 y.o. M) Primary Care Wyley Hack: Foye [name] Other Clinician: Referring Yennifer Segovia: Treating Marshell Dilauro/Extender: Kandis Cocking in Treatment: 41 Vital Signs Height(in): 74 Capillary Blood Glucose(mg/dl): 536 Weight(lbs): 644 Pulse(bpm): 81 Body Mass Index(BMI): 31.5 Blood Pressure(mmHg): 133/71 Temperature(F): 98.1 Respiratory Rate(breaths/min): 18 [1:Photos: No Photos Left Calcaneus Wound Location: Pressure Injury Wounding Event: Diabetic Wound/Ulcer of the Lower Primary Etiology:] [2:No Photos Left, Dorsal Foot Pressure Injury Diabetic Wound/Ulcer of the Lower] [N/A:N/A N/A N/A N/A] ZABIEN, [name] ([id number]) [1:Extremity Cataracts, Sleep Apnea, Hypertension, Cataracts, Sleep Apnea, Hypertension, N/A Comorbid History: Type II Diabetes, Osteomyelitis, Neuropathy  03/09/2019 Date Acquired: 52 Weeks of Treatment: Open Wound Status: No Wound Recurrence: No  Clustered Wound: N/A Clustered Quantity: 6x5.2x1 Measurements L x W x D (cm) 24.504 A (cm) : rea 24.504 Volume (cm) : -13.50% % Reduction in A rea: -13.50% % Reduction in Volume: Grade 3 Classification: Large Exudate A mount: Serosanguineous Exudate  Type: red, brown Exudate Color: Thickened Wound Margin: Large (67-100%) Granulation A mount: Pink, Pale, Hyper-granulation Granulation Quality: Small (1-33%) Necrotic A mount: Fat Layer (Subcutaneous Tissue): Yes Fat Layer (Subcutaneous Tissue): Yes N/A  Exposed Structures: Bone: Yes Fascia: No Tendon: No Muscle: No Joint: No Small (1-33%) Epithelialization: Debridement - Selective/Open Wound N/A Debridement: Pre-procedure Verification/Time Out 10:35 Taken: Callus, Slough Tissue Debrided: Skin/Epidermis  Level: 24.49 Debridement A (sq cm): rea Curette Instrument: Minimum Bleeding: Pressure Hemostasis A chieved: Insensate Procedural Pain: Insensate Post Procedural Pain: Procedure was tolerated well Debridement Treatment Response:  6x5.2x1 Post Debridement  Measurements L x W x D (cm) 24.504 Post Debridement Volume: (cm) Callus: Yes Periwound Skin Texture: Maceration: Yes Periwound Skin Moisture: Dry/Scaly: No Erythema: No Periwound Skin Color: Rubor: No No Abnormality Temperature: Debridement Procedures  Performed:] [2:Extremity Type II Diabetes, Osteomyelitis, Neuropathy 07/09/2022 41 Open No Yes 2 3.5x1.8x0.1 4.948 0.495 47.50% 47.50% Grade 2 Medium Serosanguineous red, brown Flat and Intact Large (67-100%) Red, Hyper-granulation, Friable Small (1-33%)  Fascia: No Tendon: No Muscle: No Joint: No Bone: No Small (1-33%) N/A N/A N/A N/A N/A N/A N/A N/A N/A N/A N/A N/A Excoriation: Yes Scarring: Yes Dry/Scaly: Yes Maceration: No No Abnormality Chemical Cauterization]  [[id number] Page 3 of 9 N/A N/A N/A N/A N/A N/A N/A N/A N/A N/A N/A N/A N/A N/A N/A N/A N/A N/A N/A N/A N/A N/A N/A N/A N/A N/A N/A N/A N/A N/A N/A N/A N/A N/A N/A N/A N/A N/A] Treatment Notes Electronic Signature(s) Signed: 06/24/2023 10:50:04 AM By: [name] MD FACS Entered By: [name] on 06/24/2023 10:50:04 -------------------------------------------------------------------------------- Multi-Disciplinary Care Plan Details Patient Name: Date of Service: [place], [name] 06/24/2023 10:15 A M Medical Record Number: [id number] Patient Account Number: [id number] Date of Birth/Sex: Treating RN: Apr 01, 1956 (67 y.o. [name] Primary Care Xoe Hoe: Foye [name] Other Clinician: Referring Everard Interrante: Treating Shonta Phillis/Extender: Kandis Cocking in Treatment: [address] Multidisciplinary Care Plan reviewed with physician [address] GALO, BAEHR ([id number]) [id number] Page 4 of 9 Nutrition Nursing Diagnoses: Imbalanced nutrition Impaired glucose control: actual or potential Potential for alteratiion in Nutrition/Potential for imbalanced  nutrition Goals: Patient/caregiver will maintain therapeutic glucose control Date Initiated: 05/27/2023 Target Resolution Date: 07/22/2023 Goal Status: Active Interventions: Assess HgA1c results as ordered upon admission and as needed Assess patient nutrition upon admission and as needed per policy Provide education on elevated blood sugars and impact on wound healing Treatment Activities: Patient referred to Primary Care Physician for further nutritional evaluation : 05/27/2023 Notes: Wound/Skin Impairment Nursing Diagnoses: Impaired tissue integrity Goals: Patient/caregiver will verbalize understanding of skin care regimen Date Initiated: 10/06/2022 Target Resolution Date: 07/13/2023 Goal Status: Active Ulcer/skin breakdown will have a volume reduction of 30% by week 4 Date Initiated: 09/10/2022 Date Inactivated: 10/06/2022 Target Resolution Date: 10/08/2022 Goal Status: Unmet Unmet Reason: osteo, HBOT Ulcer/skin breakdown will have a volume reduction of 50% by week 8 Date Initiated: 10/06/2022 Date Inactivated: 02/14/2023 Target Resolution Date: 01/14/2023 Unmet Reason: insufficient perfusion Goal Status: Unmet to Left lower extremity Ulcer/skin breakdown will have a volume reduction of 80% by week 12 Date Initiated: 02/14/2023 Date Inactivated: 03/14/2023 Target Resolution Date: 03/15/2023 Goal  Status: Unmet Unmet Reason: osteomyelitis Interventions: Assess ulceration(s) every visit Provide education on ulcer and skin care Treatment Activities: Consult for HBO : 09/10/2022 Skin care regimen initiated : 09/10/2022 Notes: NPWT started 12/27/22 Electronic Signature(s) Signed: 06/24/2023 12:03:07 PM By: Zenaida Deed RN, BSN Entered By: Zenaida Deed on 06/24/2023 10:31:40 -------------------------------------------------------------------------------- Pain Assessment Details Patient Name: Date of Service: [place], [name] 06/24/2023 10:15 A M Medical Record Number:  [id number] Patient Account Number: [id number] Date of Birth/Sex: Treating RN: 1955/12/19 (67 y.o. [name] Primary Care Keyonda Bickle: Foye [name] Other Clinician: Referring Braedin Millhouse: Treating Angell Honse/Extender: Kandis Cocking in Treatment: [address] [place], [place] [name] ([id number]) [id number] Page 5 of 9 Location of Pain Severity and Description of Pain Patient Has Paino No Site Locations Rate the pain. Current Pain Level: 0 Pain Management and Medication Current Pain Management: Electronic Signature(s) Signed: 06/24/2023 12:03:07 PM By: Zenaida Deed RN, BSN Entered By: Zenaida Deed on 06/24/2023 10:24:21 -------------------------------------------------------------------------------- Patient/Caregiver Education Details Patient Name: Date of Service: [name] [name] 7/12/2024andnbsp10:15 A M Medical Record Number: [id number] Patient Account Number: [id number] Date of Birth/Gender: Treating RN: Oct 30, 1956 (67 y.o. [name] Primary Care Physician: Foye [name] Other Clinician: Referring Physician: Treating Physician/Extender: Kandis Cocking in Treatment: 11 Education Assessment Education Provided To: Patient Education Topics Provided Offloading: Methods: Explain/Verbal Responses: Reinforcements needed, State content correctly Wound/Skin Impairment: Methods: Explain/Verbal Responses: Reinforcements needed, State content correctly Electronic Signature(s) Signed: 06/24/2023 12:03:07 PM By: Zenaida Deed RN, BSN Entered By: Zenaida Deed on 06/24/2023 10:32:10 [name] ([id number]) [id number] Page 6 of 9 -------------------------------------------------------------------------------- Wound Assessment Details Patient Name: Date of Service: [place], [name] 06/24/2023 10:15 A M Medical Record Number: [id number] Patient Account  Number: [id number] Date of Birth/Sex: Treating RN: 04/29/1956 (67 y.o. [name] Primary Care Isiac Breighner: Foye [name] Other Clinician: Referring Adan Beal: Treating Annsleigh Dragoo/Extender: Kandis Cocking in Treatment: 41 Wound Status Wound Number: 1 Primary Diabetic Wound/Ulcer of the Lower Extremity Etiology: Wound Location: Left Calcaneus Wound Open Wounding Event: Pressure Injury Status: Date Acquired: 03/09/2019 Comorbid Cataracts, Sleep Apnea, Hypertension, Type II Diabetes, Weeks Of Treatment: 41 History: Osteomyelitis, Neuropathy Clustered Wound: No Wound Measurements Length: (cm) 6 Width: (cm) 5.2 Depth: (cm) 1 Area: (cm) 24.504 Volume: (cm) 24.504 % Reduction in Area: -13.5% % Reduction in Volume: -13.5% Epithelialization: Small (1-33%) Tunneling: No Undermining: No Wound Description Classification: Grade 3 Wound Margin: Thickened Exudate Amount: Large Exudate Type: Serosanguineous Exudate Color: red, brown Foul Odor After Cleansing: No Slough/Fibrino Yes Wound Bed Granulation Amount: Large (67-100%) Exposed Structure Granulation Quality: Pink, Pale, Hyper-granulation Fascia Exposed: No Necrotic Amount: Small (1-33%) Fat Layer (Subcutaneous Tissue) Exposed: Yes Necrotic Quality: Adherent Slough Tendon Exposed: No Muscle Exposed: No Joint Exposed: No Bone Exposed: Yes Periwound Skin Texture Texture Color No Abnormalities Noted: No No Abnormalities Noted: Yes Callus: Yes Temperature / Pain Temperature: No Abnormality Moisture No Abnormalities Noted: No Dry / Scaly: No Maceration: Yes Treatment Notes Wound #1 (Calcaneus) Wound Laterality: Left Cleanser Soap and Water Discharge Instruction: May shower and wash wound with dial antibacterial soap and water prior to dressing change. Vashe 5.8 (oz) Discharge Instruction: Cleanse the wound with Vashe prior to applying a clean dressing using gauze sponges, not tissue or  cotton balls. Peri-Wound Care Zinc Oxide Ointment 30g tube Discharge Instruction: Apply Zinc Oxide to periwound with each dressing change Topical Gentamicin Discharge Instruction: Apply to wound when [facility] not available BRONSEN, TAJIMA ([id number]) [id number] Page  7 of 9 Mupirocin Ointment Discharge Instruction: when keystone not available apply Mupirocin (Bactroban) as instructed keystone Primary Dressing Maxorb Extra Ag+ Alginate Dressing, 4x4.75 (in/in) Discharge Instruction: Apply to wound bed as instructed Secondary Dressing ABD Pad, 8x10 Discharge Instruction: Apply over primary dressing as directed. Woven Gauze Sponge, Non-Sterile 4x4 in Discharge Instruction: Wet with Vashe we to dry. Zetuvit Absorbent Pad, 4x4 (in/in) Discharge Instruction: apply over primary dressing Secured With Coban Self-Adherent Wrap 4x5 (in/yd) Discharge Instruction: Secure with Coban as directed. Kerlix Roll Sterile, 4.5x3.1 (in/yd) Discharge Instruction: Secure with Kerlix as directed. Compression Wrap Compression Stockings Add-Ons Electronic Signature(s) Signed: 06/24/2023 12:03:07 PM By: Zenaida Deed RN, BSN Entered By: Zenaida Deed on 06/24/2023 10:28:29 -------------------------------------------------------------------------------- Wound Assessment Details Patient Name: Date of Service: [place], [name] 06/24/2023 10:15 A M Medical Record Number: [id number] Patient Account Number: [id number] Date of Birth/Sex: Treating RN: December 22, 1955 (67 y.o. [name] Primary Care Eron Staat: Foye [name] Other Clinician: Referring Khaleesi Gruel: Treating Mariena Meares/Extender: Kandis Cocking in Treatment: 41 Wound Status Wound Number: 2 Primary Diabetic Wound/Ulcer of the Lower Extremity Etiology: Wound Location: Left, Dorsal Foot Wound Open Wounding Event: Pressure Injury Status: Date Acquired: 07/09/2022 Comorbid Cataracts, Sleep  Apnea, Hypertension, Type II Diabetes, Weeks Of Treatment: 41 History: Osteomyelitis, Neuropathy Clustered Wound: Yes Wound Measurements Length: (cm) Width: (cm) Depth: (cm) Clustered Quantity: Area: (cm) Volume: (cm) 3.5 % Reduction in Area: 47.5% 1.8 % Reduction in Volume: 47.5% 0.1 Epithelialization: Small (1-33%) 2 Tunneling: No 4.948 Undermining: No 0.495 Wound Description Classification: Grade 2 Wound Margin: Flat and Intact Exudate Amount: Medium Exudate Type: Serosanguineous Exudate Color: red, brown Foul Odor After Cleansing: No Slough/Fibrino Yes Wound Bed KU, LEISNER ([id number]) [id number] Page 8 of 9 Granulation Amount: Large (67-100%) Exposed Structure Granulation Quality: Red, Hyper-granulation, Friable Fascia Exposed: No Necrotic Amount: Small (1-33%) Fat Layer (Subcutaneous Tissue) Exposed: Yes Necrotic Quality: Adherent Slough Tendon Exposed: No Muscle Exposed: No Joint Exposed: No Bone Exposed: No Periwound Skin Texture Texture Color No Abnormalities Noted: No No Abnormalities Noted: Yes Excoriation: Yes Temperature / Pain Scarring: Yes Temperature: No Abnormality Moisture No Abnormalities Noted: No Dry / Scaly: Yes Maceration: No Treatment Notes Wound #2 (Foot) Wound Laterality: Dorsal, Left Cleanser Soap and Water Discharge Instruction: May shower and wash wound with dial antibacterial soap and water prior to dressing change. Vashe 5.8 (oz) Discharge Instruction: Cleanse the wound with Vashe prior to applying a clean dressing using gauze sponges, not tissue or cotton balls. Peri-Wound Care Topical Gentamicin Discharge Instruction: Apply to wound when keystone not available Mupirocin Ointment Discharge Instruction: when keystone not available apply Mupirocin (Bactroban) as instructed keystone Primary Dressing Maxorb Extra Ag+ Alginate Dressing, 2x2 (in/in) Discharge Instruction: Apply to wound bed as  instructed Secondary Dressing Woven Gauze Sponge, Non-Sterile 4x4 in Discharge Instruction: Wet with Vashe we to dry. Zetuvit Plus 4x8 in Discharge Instruction: Apply over primary dressing as directed. Secured With [employer] 4x5 (in/yd) Discharge Instruction: Secure with Coban as directed. Kerlix Roll Sterile, 4.5x3.1 (in/yd) Discharge Instruction: Secure with Kerlix as directed. Compression Wrap Compression Stockings Add-Ons Electronic Signature(s) Signed: 06/24/2023 12:03:07 PM By: Zenaida Deed RN, BSN Entered By: Zenaida Deed on 06/24/2023 10:31:06 -------------------------------------------------------------------------------- Vitals Details Patient Name: Date of Service: [name], [name] 06/24/2023 10:15 A Dwan Bolt, Faye Ramsay ([id number]) [id number] Page 9 of 9 Medical Record Number: [id number] Patient Account Number: [id number] Date of Birth/Sex: Treating RN: 20-Jan-1956 (66 y.o. [name] Primary Care Demondre Aguas: Foye [name] Other  Clinician: Referring Yoshito Gaza: Treating Jaydien Panepinto/Extender: Kandis Cocking in Treatment: 41 Vital Signs Time Taken: 10:17 Temperature (F): 98.1 Height (in): 74 Pulse (bpm): 81 Weight (lbs): 245 Respiratory Rate (breaths/min): 18 Body Mass Index (BMI): 31.5 Blood Pressure (mmHg): 133/71 Capillary Blood Glucose (mg/dl): 604 Reference Range: 80 - 120 mg / dl Notes glucose per pt report this am Electronic Signature(s) Signed: 06/24/2023 12:03:07 PM By: Zenaida Deed RN, BSN Entered By: Zenaida Deed on 06/24/2023 10:17:57

## 2023-06-24 NOTE — Progress Notes (Signed)
DEKEVION, PIERE ([id number]) [id number] Page 1 of 13 Visit Report for 06/24/2023 Chief Complaint Document Details Patient Name: Date of Service: [name], [name] 06/24/2023 10:15 A M Medical Record Number: [id number] Patient Account Number: [id number] Date of Birth/Sex: Treating RN: June 28, 1956 (67 y.o. M) Primary Care Provider: Foye [name] Other Clinician: Referring Provider: Treating Provider/Extender: Kandis Cocking in Treatment: 58 Information Obtained from: Patient Chief Complaint Patients presents for treatment of an open diabetic ulcer and evaluation for hyperbaric oxygen therapy Electronic Signature(s) Signed: 06/24/2023 10:50:13 AM By: [name] MD FACS Entered By: [name] on 06/24/2023 10:50:13 -------------------------------------------------------------------------------- Debridement Details Patient Name: Date of Service: [name], [name] 06/24/2023 10:15 A M Medical Record Number: [id number] Patient Account Number: [id number] Date of Birth/Sex: Treating RN: 02/10/1956 (67 y.o. [name] Primary Care Provider: Foye [name] Other Clinician: Referring Provider: Treating Provider/Extender: Kandis Cocking in Treatment: 41 Debridement Performed for Assessment: Wound #1 Left Calcaneus Performed By: Physician [name], MD Debridement Type: Debridement Severity of Tissue Pre Debridement: Necrosis of bone Level of Consciousness (Pre-procedure): Awake and Alert Pre-procedure Verification/Time Out Yes - 10:35 Taken: Start Time: 10:38 Percent of Wound Bed Debrided: 100% T Area Debrided (cm): otal 24.49 Tissue and other material debrided: Non-Viable, Callus, Slough, Skin: Epidermis, Slough Level: Skin/Epidermis Debridement Description: Selective/Open Wound Instrument: Curette Bleeding: Minimum Hemostasis Achieved: Pressure Procedural Pain: Insensate Post  Procedural Pain: Insensate Response to Treatment: Procedure was tolerated well Level of Consciousness (Post- Awake and Alert procedure): Post Debridement Measurements of Total Wound Length: (cm) 6 Width: (cm) 5.2 Depth: (cm) 1 Volume: (cm) 24.504 Character of Wound/Ulcer Post Debridement: Stable Severity of Tissue Post Debridement: Necrosis of bone NEDDIE, DINARDI ([id number]) [id number] Page 2 of 13 Post Procedure Diagnosis Same as Pre-procedure Notes scribed for Dr. Lady Gary by Zenaida Deed, RN Electronic Signature(s) Signed: 06/24/2023 11:13:08 AM By: [name] MD FACS Signed: 06/24/2023 12:03:07 PM By: Zenaida Deed RN, BSN Entered By: Zenaida Deed on 06/24/2023 10:43:27 -------------------------------------------------------------------------------- HPI Details Patient Name: Date of Service: [name], [name] 06/24/2023 10:15 A M Medical Record Number: [id number] Patient Account Number: [id number] Date of Birth/Sex: Treating RN: 1956-11-14 (67 y.o. M) Primary Care Provider: Foye [name] Other Clinician: Referring Provider: Treating Provider/Extender: Kandis Cocking in Treatment: 71 History of Present Illness HPI Description: ADMISSION 09/10/2022 This is a 67 year old poorly controlled type II diabetic (last hemoglobin A1c 10.8%) who has had an ulcer on his heel for over 3 years. He has been seen in multiple wound care centers, including [facility] and [facility]. He reports that at least 3 doctors have recommended that he undergo below-knee amputation. He most recently met with Dr. Reuel Boom, a vascular surgeon affiliated with [facility]. 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 Unfortunately, the patient has some extenuating social circumstances including the fact that he cares for his wife who has stage IV colon cancer  and still works, driving vehicles for [employer]. 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 He was recently hospitalized at [facility] for sepsis and currently has a PICC line through which he receives IV antibiotics. He reports having had another MRI during that hospital stay along with a chest x-ray and EKG. He apparently contacted one of the hyperbaric therapy techs here and asked a number of questions about hyperbaric oxygen treatments. He subsequently self-referred to our  center to undergo further evaluation and management. I mention to him that [facility] actually has hyperbaric chambers, but he states that he lives in [place] and this would be more convenient for him given the intensive nature of the therapy and time requirement. ABI in clinic today was 0.94. The patient actually has 2 wounds. There is a wound on the dorsum of his left foot with heavy black eschar and slough present. After debridement, this was demonstrated to involve the muscle and the extensor tendons are exposed. On his heel, there is essentially a "shark bite" type wound, with much of the heel fat pad absent. The muscle layer is exposed. There is blue-green staining around the perimeter of the wound, but no significant odor. He says he has been applying collagen to the wound on his heel and Silvadene and Betadine to the wound on his dorsal foot. 09/20/2022: The heel wound is quite macerated with wet periwound callus. There is slough accumulation on the surface. The dorsal foot wound looks better this week. There is still exposed tendon, but it is fairly clean with just a little biofilm buildup. We are still working on gathering the required documentation to submit for pretreatment review for hyperbaric oxygen therapy. 09/29/2022: The dorsal foot wound continues to improve. I do not see any exposed tendon at this point. There is just some slough accumulation on the wound surface. He  continues to have very wet macerated periwound callus on his heel. There is slough on the surface, but it is loose and thin. There is an area of undermining at the 11 o'clock position, but the overlying skin and subcutaneous tissue is healthy and viable. He has been approved for hyperbaric oxygen therapy and will start treatment tomorrow. 10/06/2022: Continued contraction and improvement of the dorsal foot wound. There is just a little bit of slough accumulation on the wound surface. The periwound callus continues to accumulate on the heel wound and it is quite macerated. It is also persistently found with blue-green staining present. He did initiate his hyperbaric oxygen therapy, but has had significant difficulty with decompression. He will be going to ENT to have PE tubes placed. 10/18/2022: His hyperbaric oxygen therapy is on hold while his otological issues are being addressed. He came in again today with the periwound skin on both the dorsal aspect of his foot and his calcaneus completely macerated. The blue-green discoloration, however, has abated and the undermining on the calcaneus has improved. The dorsal foot wound has also contracted somewhat. 10/26/2022: The dorsal foot wound continues to contract and fill with good granulation tissue. The heel, once again, has a rim of macerated callus, but the undermining and tunneling continues to contract. He has completed his oral antibiotics and has been using the [facility] topical compounded antibiotic for his dressing changes at home. He is scheduled to see ENT this afternoon. 11/08/2022: The dorsal foot wound is flush with the surrounding skin and has a good granulation tissue surface. There is some slough accumulation. As usual, the heel has a rim of macerated callus but the dimensions are smaller and the tunneling and undermining have contracted further. He has resumed his hyperbaric oxygen therapy. 12/12; patient seen for wound evaluation. He is  tolerating HBO although we could not dive him yesterday because of relative hypoglycemia and the fact he had given himself NovoLog insulin before he came to clinic. Today his blood sugar is in the 180 range she should be fine. He has a large wound on the  plantar calcaneus on the right and a more superficial area on the dorsal foot. He is using a scooter for offloading 11/30/2022: The dorsal foot wound continues to contract. There is some slough on the wound surface. The large calcaneus wound has heaped up wet callus around the margins. Continued undermining. 12/08/2022: The dorsal foot wound is flat and flush with the surrounding skin surface. There is some slough present. Once again, the calcaneal wound has SHAHEED, KOERBER ([id number]) [id number] Page 3 of 13 thick, absolutely macerated callus hanging off in tatters around the edges. The patient cannot explain to me why this part of his wound gets so wet. There is slough on the surface. The undermined portion of the wound has filled in. 12/17/2022: Earlier this week, he presented for his hyperbaric oxygen therapy and was hypotensive and ill-appearing. He also had what appeared to be an infected sebaceous cyst on his right upper arm. He was sent to the emergency department. He was given a fluid bolus and the ED provider lanced the cyst. He was prescribed doxycycline. He seems to be feeling better today. He notes that he has had copious drainage from his foot. On further questioning, he states that he has not been taking his oral diuretic. The dorsal foot wound has expanded somewhat, but is actually more superficial. The plantar heel wound is about the same. 12/27/2022: The dorsal foot wound has epithelialized further. The plantar foot wound is about the same size but has heaped up macerated callus around the perimeter. The intake nurse noted a tiny fragment of bone on his dressings and there is now an area at about the 5  o'clock position where 1 can palpate bone through a small slit in the soft tissues. The wound on his arm is contracting but he still likely has cyst wall present due to the manner in which the infected sebaceous cyst was addressed in the ER. 01/01/2023: The wound on his shoulder has contracted considerably. The dorsal foot wound has some slough on the surface but also looks to be improving. The plantar foot wound is basically unchanged. 01/10/2023: The wound on his shoulder continues to diminish in size and depth. The dorsal foot wound is improving with just a light layer of slough on the surface. The plantar foot wound appears to be contracting but still has thick wet callus around the perimeter. We did in chamber T cPO2 monitoring during his last hyperbaric oxygen therapy. The results show that he has extremely poor tissue perfusion at room air and 1 atm of pressure. He does respond extremely well to hyperbaric oxygen therapy, however. 01/17/2023: The wound on his shoulder is down to 0.6 cm in depth, down from 1 cm last week. The dorsal foot wound is quite a bit smaller this week with just some light slough on the surface. The plantar foot wound is also smaller and the area of bone that was exposed at the posterior heel is now covered. There is still substantial wet callus around the perimeter. He is scheduled to undergo formal vascular studies sometime next week. 01/24/2023: The wound on his shoulder has almost completely filled in, with just a little bit of depth remaining. The dorsal foot wound got macerated; it appears that his home health nurses are applying the drape for his heel wound over the top of this, meaning that the dorsal foot wound is not getting changed as regularly as it is supposed to. There is some slough on the surface. The plantar foot wound  has thick wet callus around the perimeter. The dimensions measure a little bit smaller. He is having his vascular studies done this  afternoon. 02/07/2023: The wound on his shoulder is healed. The dorsal foot wound continues to be subjected to excessive moisture due to the home health nurses applying the drape from his wound VAC over the top of it. It does measure smaller, however. There is more bone exposed on his calcaneus, unfortunately. He has heaped up wet callus around the edges of the heel wound again. He is going to undergo angiography with Dr. Lenell Antu on Friday. Hopefully there will be intervention available to him that may aid in his wound healing. 02/14/2023: He did not get his angiogram last week with Dr. Lenell Antu because he did not have a ride home. The dorsal foot wound looks a little bit better with just a light layer of slough. Unfortunately, there is more bone exposed at the calcaneus and I am concerned that we are nearing a point at which his leg will not be salvageable. 3/18; patient presents for follow-up. He was recently hospitalized on 3/11 for sepsis secondary to diabetic foot wound. He was dishcharged with Augmentin and doxycycline and plan is for 6 weeks of this. He currently has a wound VAC. Wound has declined in appearance. Amputation was recommended in the hospital however patient is not ready for this. Patient states he would like to stop HBO. He currently denies systemic signs of infection. 03/07/2023: The calcaneal wound continues to deteriorate. There is significant undermining around the posterior aspect of the calcaneus and bone is palpable all the way around. The dorsal foot wound has some leathery eschar and slough present. 03/14/2023: The dorsal foot wound is somewhat leathery with some dark eschar and slough present. The calcaneal wound is deteriorating further. There are fragments of the bone hanging loose. He does have an appointment with Dr. Lenell Antu next week on Tuesday to discuss amputation. 04/04/2023: The heel ulcer is larger, but shallower. There is heaped up with callus around the edges. The  dorsal foot wound is stable. He has appointment with Dr. Lenell Antu was rescheduled for next Tuesday. He is working on arrangements for his dog and his wife to facilitate below-knee amputation. 04/12/2023: The heel ulcer looks about the same. Bone is not exposed. There is moist callus heaped up around the edges. The dorsal foot wound is stable. He will be seeing Dr. Lenell Antu this afternoon to discuss amputation. 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 The dorsal foot wound is a little bit smaller and shallower. It is not as irritated as it often looks. There is slough on the surface. He met with Dr. Lenell Antu and is still not ready to pursue amputation at this time. 05/16/2023: The dorsal foot wound has gotten larger and there appears to be a lot of drainage resulting in periwound tissue maceration. The calcaneal wound also looks similar, with heaped up macerated callus around the edges and a layer of slough on the surface. 05/27/2023: No real change to his wounds. He is still avoiding amputation. 06/10/2023: The dorsal foot wound actually looks a little bit better this week. There is light slough on the surface. On the plantar heel wound, he has less wet callus accumulation. There is rubbery slough on the surface. 06/24/2023: The dorsal foot wound has epithelialized substantially and there are just 2 remaining open areas, both with hypertrophic granulation  tissue. The plantar heel wound has deteriorated and there are areas that now have bone easily identified. There is wet callus around the edges, as usual. Electronic Signature(s) Signed: 06/24/2023 11:00:28 AM By: [name] MD FACS Previous Signature: 06/24/2023 10:51:15 AM Version By: [name] MD FACS Entered By: [name] on 06/24/2023 11:00:28 -------------------------------------------------------------------------------- Chemical  Cauterization Details Patient Name: Date of Service: [place], [name] 06/24/2023 10:15 A M Medical Record Number: [id number] Patient Account Number: [id number] CORRIGAN, WANEK ([id number]) [id number] Page 4 of 13 Date of Birth/Sex: Treating RN: 1956-05-01 (67 y.o. [name] Primary Care Provider: Other Clinician: Foye [name] Referring Provider: Treating Provider/Extender: Kandis Cocking in Treatment: 40 Procedure Performed for: Wound #2 Left,Dorsal Foot Performed By: Physician [name], MD Post Procedure Diagnosis Same as Pre-procedure Notes using silver nitrate stick Electronic Signature(s) Signed: 06/24/2023 11:13:08 AM By: [name] MD FACS Signed: 06/24/2023 12:03:07 PM By: Zenaida Deed RN, BSN Entered By: Zenaida Deed on 06/24/2023 10:43:55 -------------------------------------------------------------------------------- Physical Exam Details Patient Name: Date of Service: [place], [name] 06/24/2023 10:15 A M Medical Record Number: [id number] Patient Account Number: [id number] Date of Birth/Sex: Treating RN: Jun 09, 1956 (67 y.o. M) Primary Care Provider: Foye [name] Other Clinician: Referring Provider: Treating Provider/Extender: Kandis Cocking in Treatment: 41 Constitutional . . . . no acute distress. Respiratory Normal work of breathing on room air. Notes 06/24/2023: The dorsal foot wound has epithelialized substantially and there are just 2 remaining open areas, both with hypertrophic granulation tissue. The plantar heel wound has deteriorated and there are areas that now have bone easily identified. There is wet callus around the edges, as usual. Electronic Signature(s) Signed: 06/24/2023 11:02:19 AM By: [name] MD FACS Entered By: [name] on 06/24/2023  11:02:19 -------------------------------------------------------------------------------- Physician Orders Details Patient Name: Date of Service: [place], [name] 06/24/2023 10:15 A M Medical Record Number: [id number] Patient Account Number: [id number] Date of Birth/Sex: Treating RN: 13-Jun-1956 (67 y.o. 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 - Dr. Lady Gary Room 1 Return A Anesthetic Wound #1 Left Calcaneus (In clinic) Topical Lidocaine 4% applied to wound bed Wound #2 Left,Dorsal Foot (In clinic) Topical Lidocaine 4% applied to wound bed Bathing/ Shower/ Hygiene May shower and wash wound with soap and water. - use antibacterial soap Edema Control - Lymphedema / SCD / Other Left Lower Extremity Elevate legs to the level of the heart or above for 30 minutes daily and/or when sitting for 3-4 times a day throughout the day. Avoid standing for long periods of time. Moisturize legs daily. Compression stocking or Garment 30-40 mm/Hg pressure to: - left leg daily Off-Loading Wound #1 Left Calcaneus Other: - use knee scooter to ambulate, minimal weight bearing right foot Wound Treatment Wound #1 - Calcaneus Wound Laterality: Left Cleanser: Soap and Water 1 x Per Day/30 Days Discharge Instructions: May shower and wash wound with  dial antibacterial soap and water prior to dressing  change. Cleanser: Vashe 5.8 (oz) 1 x Per Day/30 Days Discharge Instructions: Cleanse the wound with Vashe prior to applying a clean dressing using gauze sponges, not tissue or cotton balls. Peri-Wound Care: Zinc Oxide Ointment 30g tube 1 x Per Day/30 Days Discharge Instructions: Apply Zinc Oxide to periwound with each dressing change Topical: Gentamicin 1 x Per Day/30 Days Discharge Instructions: Apply to wound when keystone not available Topical: Mupirocin Ointment 1 x Per Day/30 Days Discharge Instructions: when keystone not available apply Mupirocin (Bactroban) as instructed Topical: keystone 1 x Per Day/30 Days Prim Dressing: Maxorb Extra Ag+ Alginate Dressing, 4x4.75 (in/in) (DME) (Generic) 1 x Per Day/30 Days ary Discharge Instructions: Apply to wound bed as instructed Secondary Dressing: ABD Pad, 8x10 (DME) (Generic) 1 x Per Day/30 Days Discharge Instructions: Apply over primary dressing as directed. Secondary Dressing: Woven Gauze Sponge, Non-Sterile 4x4 in (DME) (Generic) 1 x Per Day/30 Days Discharge Instructions: Wet with Vashe we to dry. Secondary Dressing: Zetuvit Absorbent Pad, 4x4 (in/in) (DME) (Dispense As Written) 1 x Per Day/30 Days Discharge Instructions: apply over primary dressing Secured With: Coban Self-Adherent Wrap 4x5 (in/yd) (DME) (Generic) 1 x Per Day/30 Days Discharge Instructions: Secure with Coban as directed. Secured With: [employer], 4.5x3.1 (in/yd) (DME) (Generic) 1 x Per Day/30 Days Discharge Instructions: Secure with Kerlix as directed. Wound #2 - Foot Wound Laterality: Dorsal, Left Cleanser: Soap and Water 1 x Per Day/30 Days Discharge Instructions: May shower and wash wound with dial antibacterial soap and water prior to dressing change. Cleanser: Vashe 5.8 (oz) 1 x Per Day/30 Days Discharge Instructions: Cleanse the wound with Vashe prior to applying a clean dressing using gauze sponges, not  tissue or cotton balls. Topical: Gentamicin 1 x Per Day/30 Days Discharge Instructions: Apply to wound when [facility] not available ANABEL, HOLLERBACH ([id number]) [id number] Page 6 of 13 Topical: Mupirocin Ointment 1 x Per Day/30 Days Discharge Instructions: when keystone not available apply Mupirocin (Bactroban) as instructed Topical: keystone 1 x Per Day/30 Days Prim Dressing: Maxorb Extra Ag+ Alginate Dressing, 2x2 (in/in) (DME) (Generic) 1 x Per Day/30 Days ary Discharge Instructions: Apply to wound bed as instructed Secondary Dressing: Woven Gauze Sponge, Non-Sterile 4x4 in (DME) (Generic) 1 x Per Day/30 Days Discharge Instructions: Wet with Vashe we to dry. Secondary Dressing: Zetuvit Plus 4x8 in (Generic) 1 x Per Day/30 Days Discharge Instructions: Apply over primary dressing as directed. Secured With: Coban Self-Adherent Wrap 4x5 (in/yd) (Generic) 1 x Per Day/30 Days Discharge Instructions: Secure with Coban as directed. Secured With: [employer], 4.5x3.1 (in/yd) (DME) (Generic) 1 x Per Day/30 Days Discharge Instructions: Secure with Kerlix as directed. Electronic Signature(s) Signed: 06/24/2023 11:13:08 AM By: [name] MD FACS Entered By: [name] on 06/24/2023 11:02:49 -------------------------------------------------------------------------------- Problem List Details Patient Name: Date of Service: [place], [name] 06/24/2023 10:15 A M Medical Record Number: [id number] Patient Account Number: [id number] Date of Birth/Sex: Treating RN: December 23, 1955 (67 y.o. 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 09/10/2022 No Yes L97.523  Non-pressure chronic ulcer of other part of left foot with necrosis of muscle 09/10/2022 No Yes N82.956 Other chronic osteomyelitis, left ankle and foot 09/10/2022 No Yes E11.65 Type 2 diabetes mellitus with hyperglycemia 09/10/2022 No Yes E11.621 Type 2 diabetes mellitus with foot ulcer  09/10/2022 No Yes Inactive Problems ICD-10 Code Description Active Date Inactive Date L72.3 Sebaceous cyst 12/17/2022 12/17/2022 CALIJAH, VANDERHOEF ([id number]) [id number] Page 7 of 13 Resolved Problems ICD-10 Code Description Active Date Resolved Date L98.492 Non-pressure chronic ulcer of skin of other sites with fat layer exposed 12/17/2022 01/03/2023 Electronic Signature(s) Signed: 06/24/2023 10:49:57 AM By: [name] MD FACS Entered By: [name] on 06/24/2023 10:49:57 -------------------------------------------------------------------------------- Progress Note Details Patient Name: Date of Service: [name], [name] 06/24/2023 10:15 A M Medical Record Number: [id number] Patient Account Number: [id number] Date of Birth/Sex: Treating RN: February 18, 1956 (67 y.o. M) Primary Care Provider: Foye [name] Other Clinician: Referring Provider: Treating Provider/Extender: Kandis Cocking in Treatment: 58 Subjective Chief Complaint Information obtained from Patient Patients presents for treatment of an open diabetic ulcer and evaluation for hyperbaric oxygen therapy History of Present Illness (HPI) ADMISSION 09/10/2022 This is a 67 year old poorly controlled type II diabetic (last hemoglobin A1c 10.8%) who has had an ulcer on his heel for over 3 years. He has been seen in multiple wound care centers, including [facility] and [facility]. He reports that at least 3 doctors have recommended that he undergo below-knee amputation. He most recently met with Dr. Reuel Boom, a vascular surgeon affiliated with [facility]. Vascular studies were  done and demonstrated that he had adequate perfusion to heal a below-knee amputation. Unfortunately, the patient has some extenuating social circumstances including the fact that he cares for his wife who has stage IV colon cancer and still works, driving vehicles for [employer]. 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 He was recently hospitalized at [facility] for sepsis and currently has a PICC line through which he receives IV antibiotics. He reports having had another MRI during that hospital stay along with a chest x-ray and EKG. He apparently contacted one of the hyperbaric therapy techs here and asked a number of questions about hyperbaric oxygen treatments. He subsequently self-referred to our center to undergo further evaluation and management. I mention to him that [facility] actually has hyperbaric chambers, but he states that he lives in [place] and this would be more convenient for him given the intensive nature of the therapy and time requirement. ABI in clinic today was 0.94. The patient actually has 2 wounds. There is a wound on the dorsum of his left foot with heavy black eschar and slough present. After debridement, this was demonstrated to involve the muscle and the extensor tendons are exposed. On his heel, there is essentially a "shark bite" type wound, with much of the heel fat pad absent. The muscle layer is exposed. There is blue-green staining around the perimeter of the wound, but no significant odor. He says he has been applying collagen to the wound on his heel and Silvadene and Betadine to the wound on his dorsal foot. 09/20/2022: The heel wound is quite macerated with wet periwound callus. There is slough accumulation on the surface. The dorsal foot wound looks better this week. There is still exposed tendon, but it is fairly clean with just a little biofilm buildup. We are still working on gathering the required documentation to  submit for pretreatment review for hyperbaric oxygen therapy. 09/29/2022: The dorsal foot wound continues to improve. I do not see any exposed tendon at this point. There is just some slough accumulation on the wound surface. He continues to have very wet macerated periwound callus on his heel. There is slough  on the surface, but it is loose and thin. There is an area of undermining at the 11 o'clock position, but the overlying skin and subcutaneous tissue is healthy and viable. He has been approved for hyperbaric oxygen therapy and will start treatment tomorrow. 10/06/2022: Continued contraction and improvement of the dorsal foot wound. There is just a little bit of slough accumulation on the wound surface. The periwound callus continues to accumulate on the heel wound and it is quite macerated. It is also persistently found with blue-green staining present. He did initiate his hyperbaric oxygen therapy, but has had significant difficulty with decompression. He will be going to ENT to have PE tubes placed. 10/18/2022: His hyperbaric oxygen therapy is on hold while his otological issues are being addressed. He came in again today with the periwound skin on both the dorsal aspect of his foot and his calcaneus completely macerated. The blue-green discoloration, however, has abated and the undermining on the calcaneus has improved. The dorsal foot wound has also contracted somewhat. 10/26/2022: The dorsal foot wound continues to contract and fill with good granulation tissue. The heel, once again, has a rim of macerated callus, but the undermining and tunneling continues to contract. He has completed his oral antibiotics and has been using the [facility] topical compounded antibiotic for his dressing changes at home. He is scheduled to see ENT this afternoon. 11/08/2022: The dorsal foot wound is flush with the surrounding skin and has a good granulation tissue surface. There is some slough accumulation. As  usual, the heel has a rim of macerated callus but the dimensions are smaller and the tunneling and undermining have contracted further. He has resumed his hyperbaric oxygen therapy. SHON, KELTY ([id number]) [id number] Page 8 of 13 12/12; patient seen for wound evaluation. He is tolerating HBO although we could not dive him yesterday because of relative hypoglycemia and the fact he had given himself NovoLog insulin before he came to clinic. Today his blood sugar is in the 180 range she should be fine. He has a large wound on the plantar calcaneus on the right and a more superficial area on the dorsal foot. He is using a scooter for offloading 11/30/2022: The dorsal foot wound continues to contract. There is some slough on the wound surface. The large calcaneus wound has heaped up wet callus around the margins. Continued undermining. 12/08/2022: The dorsal foot wound is flat and flush with the surrounding skin surface. There is some slough present. Once again, the calcaneal wound has thick, absolutely macerated callus hanging off in tatters around the edges. The patient cannot explain to me why this part of his wound gets so wet. There is slough on the surface. The undermined portion of the wound has filled in. 12/17/2022: Earlier this week, he presented for his hyperbaric oxygen therapy and was hypotensive and ill-appearing. He also had what appeared to be an infected sebaceous cyst on his right upper arm. He was sent to the emergency department. He was given a fluid bolus and the ED provider lanced the cyst. He was prescribed doxycycline. He seems to be feeling better today. He notes that he has had copious drainage from his foot. On further questioning, he states that he has not been taking his oral diuretic. The dorsal foot wound has expanded somewhat, but is actually more superficial. The plantar heel wound is about the same. 12/27/2022: The dorsal foot wound has  epithelialized further. The plantar foot wound is about the same size but has heaped  up macerated callus around the perimeter. The intake nurse noted a tiny fragment of bone on his dressings and there is now an area at about the 5 o'clock position where 1 can palpate bone through a small slit in the soft tissues. The wound on his arm is contracting but he still likely has cyst wall present due to the manner in which the infected sebaceous cyst was addressed in the ER. 01/01/2023: The wound on his shoulder has contracted considerably. The dorsal foot wound has some slough on the surface but also looks to be improving. The plantar foot wound is basically unchanged. 01/10/2023: The wound on his shoulder continues to diminish in size and depth. The dorsal foot wound is improving with just a light layer of slough on the surface. The plantar foot wound appears to be contracting but still has thick wet callus around the perimeter. We did in chamber T cPO2 monitoring during his last hyperbaric oxygen therapy. The results show that he has extremely poor tissue perfusion at room air and 1 atm of pressure. He does respond extremely well to hyperbaric oxygen therapy, however. 01/17/2023: The wound on his shoulder is down to 0.6 cm in depth, down from 1 cm last week. The dorsal foot wound is quite a bit smaller this week with just some light slough on the surface. The plantar foot wound is also smaller and the area of bone that was exposed at the posterior heel is now covered. There is still substantial wet callus around the perimeter. He is scheduled to undergo formal vascular studies sometime next week. 01/24/2023: The wound on his shoulder has almost completely filled in, with just a little bit of depth remaining. The dorsal foot wound got macerated; it appears that his home health nurses are applying the drape for his heel wound over the top of this, meaning that the dorsal foot wound is not getting changed  as regularly as it is supposed to. There is some slough on the surface. The plantar foot wound has thick wet callus around the perimeter. The dimensions measure a little bit smaller. He is having his vascular studies done this afternoon. 02/07/2023: The wound on his shoulder is healed. The dorsal foot wound continues to be subjected to excessive moisture due to the home health nurses applying the drape from his wound VAC over the top of it. It does measure smaller, however. There is more bone exposed on his calcaneus, unfortunately. He has heaped up wet callus around the edges of the heel wound again. He is going to undergo angiography with Dr. Lenell Antu on Friday. Hopefully there will be intervention available to him that may aid in his wound healing. 02/14/2023: He did not get his angiogram last week with Dr. Lenell Antu because he did not have a ride home. The dorsal foot wound looks a little bit better with just a light layer of slough. Unfortunately, there is more bone exposed at the calcaneus and I am concerned that we are nearing a point at which his leg will not be salvageable. 3/18; patient presents for follow-up. He was recently hospitalized on 3/11 for sepsis secondary to diabetic foot wound. He was dishcharged with Augmentin and doxycycline and plan is for 6 weeks of this. He currently has a wound VAC. Wound has declined in appearance. Amputation was recommended in the hospital however patient is not ready for this. Patient states he would like to stop HBO. He currently denies systemic signs of infection. 03/07/2023: The calcaneal wound continues  to deteriorate. There is significant undermining around the posterior aspect of the calcaneus and bone is palpable all the way around. The dorsal foot wound has some leathery eschar and slough present. 03/14/2023: The dorsal foot wound is somewhat leathery with some dark eschar and slough present. The calcaneal wound is deteriorating further. There  are fragments of the bone hanging loose. He does have an appointment with Dr. Lenell Antu next week on Tuesday to discuss amputation. 04/04/2023: The heel ulcer is larger, but shallower. There is heaped up with callus around the edges. The dorsal foot wound is stable. He has appointment with Dr. Lenell Antu was rescheduled for next Tuesday. He is working on arrangements for his dog and his wife to facilitate below-knee amputation. 04/12/2023: The heel ulcer looks about the same. Bone is not exposed. There is moist callus heaped up around the edges. The dorsal foot wound is stable. He will be seeing Dr. Lenell Antu this afternoon to discuss amputation. 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 The dorsal foot wound is a little bit smaller and shallower. It is not as irritated as it often looks. There is slough on the surface. He met with Dr. Lenell Antu and is still not ready to pursue amputation at this time. 05/16/2023: The dorsal foot wound has gotten larger and there appears to be a lot of drainage resulting in periwound tissue maceration. The calcaneal wound also looks similar, with heaped up macerated callus around the edges and a layer of slough on the surface. 05/27/2023: No real change to his wounds. He is still avoiding amputation. 06/10/2023: The dorsal foot wound actually looks a little bit better this week. There is light slough on the surface. On the plantar heel wound, he has less wet callus accumulation. There is rubbery slough on the surface. 06/24/2023: The dorsal foot wound has epithelialized substantially and there are just 2 remaining open areas, both with hypertrophic granulation tissue. The plantar heel wound has deteriorated and there are areas that now have bone easily identified. There is wet callus around the edges, as usual. Patient History Family History Cancer - Father,Mother, Diabetes -  Mother,Father, Lung Disease - Father, Thyroid Problems - Mother, No family history of Heart Disease, Hereditary Spherocytosis, Hypertension, Kidney Disease, Seizures, Stroke, Tuberculosis. Social History Never smoker, Marital Status - Married, Alcohol Use - Never, Drug Use - No History, Caffeine Use - Daily. Medical History Eyes Patient has history of Cataracts ANDR…, BRADING ([id number]) [id number] Page 9 of 13 Denies history of Glaucoma, Optic Neuritis Ear/Nose/Mouth/Throat Denies history of Chronic sinus problems/congestion, Middle ear problems Respiratory Patient has history of Sleep Apnea Cardiovascular Patient has history of Hypertension Gastrointestinal Denies history of Cirrhosis , Colitis, Crohns, Hepatitis A, Hepatitis B, Hepatitis C Endocrine Patient has history of Type II Diabetes Immunological Denies history of Lupus Erythematosus, Raynauds, Scleroderma Musculoskeletal Patient has history of Osteomyelitis - 2023 Neurologic Patient has history of Neuropathy - Bila lower extremities Oncologic Denies history of Received Chemotherapy, Received Radiation Psychiatric Denies history of Anorexia/bulimia, Confinement Anxiety Hospitalization/Surgery History - I and D Left calcaneus. - back surgery- laminectomy. - eye surgery- Bila cataracts. - shoulder arthroscopy. Medical A Surgical History Notes nd Cardiovascular hyperlipidemia Genitourinary AKI Objective Constitutional no acute distress. Vitals Time Taken: 10:17 AM, Height: 74 in, Weight: 245 lbs, BMI: 31.5, Temperature: 98.1 F, Pulse: 81 bpm, Respiratory Rate: 18 breaths/min, Blood Pressure: 133/71 mmHg, Capillary Blood Glucose: 320  mg/dl. General Notes: glucose per pt report this am Respiratory Normal work of breathing on room air. General Notes: 06/24/2023: The dorsal foot wound has epithelialized substantially and there are just 2 remaining open areas, both with hypertrophic  granulation tissue. The plantar heel wound has deteriorated and there are areas that now have bone easily identified. There is wet callus around the edges, as usual. Integumentary (Hair, Skin) Wound #1 status is Open. Original cause of wound was Pressure Injury. The date acquired was: 03/09/2019. The wound has been in treatment 41 weeks. The wound is located on the Left Calcaneus. The wound measures 6cm length x 5.2cm width x 1cm depth; 24.504cm^2 area and 24.504cm^3 volume. There is bone and Fat Layer (Subcutaneous Tissue) exposed. There is no tunneling or undermining noted. There is a large amount of serosanguineous drainage noted. The wound margin is thickened. There is large (67-100%) pink, pale, hyper - granulation within the wound bed. There is a small (1-33%) amount of necrotic tissue within the wound bed including Adherent Slough. The periwound skin appearance had no abnormalities noted for color. The periwound skin appearance exhibited: Callus, Maceration. The periwound skin appearance did not exhibit: Dry/Scaly. Periwound temperature was noted as No Abnormality. Wound #2 status is Open. Original cause of wound was Pressure Injury. The date acquired was: 07/09/2022. The wound has been in treatment 41 weeks. The wound is located on the Left,Dorsal Foot. The wound measures 3.5cm length x 1.8cm width x 0.1cm depth; 4.948cm^2 area and 0.495cm^3 volume. There is Fat Layer (Subcutaneous Tissue) exposed. There is no tunneling or undermining noted. There is a medium amount of serosanguineous drainage noted. The wound margin is flat and intact. There is large (67-100%) red, friable, hyper - granulation within the wound bed. There is a small (1-33%) amount of necrotic tissue within the wound bed including Adherent Slough. The periwound skin appearance had no abnormalities noted for color. The periwound skin appearance exhibited: Excoriation, Scarring, Dry/Scaly. The periwound skin appearance did not  exhibit: Maceration. Periwound temperature was noted as No Abnormality. 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 There was a Selective/Open Wound Skin/Epidermis Debridement with a total area of 24.49 sq cm performed by [name], MD. With the following instrument(s): Curette to remove Non-Viable tissue/material. Material removed includes Callus, Slough, and Skin: Epidermis. No specimens were taken. A time out was conducted at 10:35, prior to the start of the procedure. A Minimum amount of bleeding was controlled with Pressure. The procedure was tolerated well with a pain level of Insensate throughout and a pain level of Insensate following the procedure. Post Debridement Measurements: 6cm length x 5.2cm width x 1cm depth; 24.504cm^3 volume. Character of Wound/Ulcer Post Debridement is stable. Severity of Tissue Post Debridement is: Necrosis of bone. Post procedure Diagnosis Wound #1: Same as Pre-Procedure General Notes: scribed for Dr. Lady Gary by Zenaida Deed, RN. Wound #2 Pre-procedure diagnosis of Wound #2 is a Diabetic Wound/Ulcer of the Lower Extremity located on the Left,Dorsal Foot . An Chemical Cauterization procedure was performed by Lady Gary,  Victorino Dike, MD. Post procedure Diagnosis Wound #2: Same as Pre-Procedure Notes: using silver nitrate stick Plan Follow-up Appointments: Return Appointment in 2 weeks. - Dr. Lady Gary Room  1 Anesthetic: Wound #1 Left Calcaneus: (In clinic) Topical Lidocaine 4% applied to wound bed Wound #2 Left,Dorsal Foot: (In clinic) Topical Lidocaine 4% applied to wound bed Bathing/ Shower/ Hygiene: May shower and wash wound with soap and water. - use antibacterial soap Edema Control - Lymphedema / SCD / Other: Elevate legs to the level of the heart or above for 30 minutes daily and/or when sitting for 3-4 times a day throughout the day. Avoid standing for long periods of time. Moisturize legs daily. Compression stocking or Garment 30-40 mm/Hg pressure to: - left leg daily Off-Loading: Wound #1 Left Calcaneus: Other: - use knee scooter to ambulate, minimal weight bearing right foot WOUND #1: - Calcaneus Wound Laterality: Left Cleanser: Soap and Water 1 x Per Day/30 Days Discharge Instructions: May shower and wash wound with dial antibacterial soap and water prior to dressing change. Cleanser: Vashe 5.8 (oz) 1 x Per Day/30 Days Discharge Instructions: Cleanse the wound with Vashe prior to applying a clean dressing using gauze sponges, not tissue or cotton balls. Peri-Wound Care: Zinc Oxide Ointment 30g tube 1 x Per Day/30 Days Discharge Instructions: Apply Zinc Oxide to periwound with each dressing change Topical: Gentamicin 1 x Per Day/30 Days Discharge Instructions: Apply to wound when keystone not available Topical: Mupirocin Ointment 1 x Per Day/30 Days Discharge Instructions: when keystone not available apply Mupirocin (Bactroban) as instructed Topical: keystone 1 x Per Day/30 Days Prim Dressing: Maxorb Extra Ag+ Alginate Dressing, 4x4.75 (in/in) (DME) (Generic) 1 x Per Day/30 Days ary Discharge Instructions: Apply to wound bed as instructed Secondary Dressing: ABD Pad, 8x10 (DME) (Generic) 1 x Per Day/30 Days Discharge Instructions: Apply over primary dressing as directed. Secondary Dressing: Woven Gauze Sponge, Non-Sterile 4x4 in (DME) (Generic) 1 x Per Day/30 Days Discharge  Instructions: Wet with Vashe we to dry. Secondary Dressing: Zetuvit Absorbent Pad, 4x4 (in/in) (DME) (Dispense As Written) 1 x Per Day/30 Days Discharge Instructions: apply over primary dressing Secured With: Coban Self-Adherent Wrap 4x5 (in/yd) (DME) (Generic) 1 x Per Day/30 Days Discharge Instructions: Secure with Coban as directed. Secured With: [employer], 4.5x3.1 (in/yd) (DME) (Generic) 1 x Per Day/30 Days Discharge Instructions: Secure with Kerlix as directed. WOUND #2: - Foot Wound Laterality: Dorsal, Left Cleanser: Soap and Water 1 x Per Day/30 Days Discharge Instructions: May shower and wash wound with dial antibacterial soap and water prior to dressing change. Cleanser: Vashe 5.8 (oz) 1 x Per Day/30 Days Discharge Instructions: Cleanse the wound with Vashe prior to applying a clean dressing using gauze sponges, not tissue or cotton balls. Topical: Gentamicin 1 x Per Day/30 Days Discharge Instructions: Apply to wound when keystone not available Topical: Mupirocin Ointment 1 x Per Day/30 Days Discharge Instructions: when keystone not available apply Mupirocin (Bactroban) as instructed Topical: keystone 1 x Per Day/30 Days Prim Dressing: Maxorb Extra Ag+ Alginate Dressing, 2x2 (in/in) (DME) (Generic) 1 x Per Day/30 Days ary Discharge Instructions: Apply to wound bed as instructed Secondary Dressing: Woven Gauze Sponge, Non-Sterile 4x4 in (DME) (Generic) 1 x Per Day/30 Days Discharge Instructions: Wet with Vashe we to dry. Secondary Dressing: Zetuvit Plus 4x8 in (Generic) 1 x Per Day/30 Days Discharge Instructions: Apply over primary dressing as directed. Secured With: Coban Self-Adherent Wrap 4x5 (in/yd) (Generic) 1 x Per Day/30 Days Discharge Instructions: Secure with Coban as directed. Secured  With: Kerlix Roll Sterile, 4.5x3.1 (in/yd) (DME) (Generic) 1 x Per Day/30 Days Discharge Instructions: Secure with Kerlix as directed. ZANE, SANJURJO ([id number])  [id number] Page 11 of 13 06/24/2023: The dorsal foot wound has epithelialized substantially and there are just 2 remaining open areas, both with hypertrophic granulation tissue. The plantar heel wound has deteriorated and there are areas that now have bone easily identified. There is wet callus around the edges, as usual. I chemically cauterized the hypertrophic granulation tissue on his dorsal foot with silver nitrate. I then debrided substantial skin, callus, and biofilm from the heel. We will continue topical gentamicin and mupirocin to all open sites with silver alginate on everything. We had another frank discussion today about the fact that this heel wound is not going to ever closed and that ultimately, he is going to have to come to terms with the fact that he requires amputation. In the meantime, I think we can heal the dorsal foot but ultimately my goal is to keep him from getting septic from his heel. Follow-up in 2 weeks. Electronic Signature(s) Signed: 06/24/2023 11:04:05 AM By: [name] MD FACS Entered By: [name] on 06/24/2023 11:04:05 -------------------------------------------------------------------------------- HxROS Details Patient Name: Date of Service: [name], [name] 06/24/2023 10:15 A M Medical Record Number: [id number] Patient Account Number: [id number] Date of Birth/Sex: Treating RN: [birth year] (67 y.o. M) Primary Care Provider: Foye [name] Other Clinician: Referring Provider: Treating Provider/Extender: Kandis Cocking in Treatment: 34 Eyes Medical History: Positive for: Cataracts Negative for: Glaucoma; Optic Neuritis Ear/Nose/Mouth/Throat Medical History: Negative for: Chronic sinus problems/congestion; Middle ear problems Respiratory Medical History: Positive for: Sleep Apnea Cardiovascular Medical History: Positive for: Hypertension Past Medical History  Notes: hyperlipidemia Gastrointestinal Medical History: Negative for: Cirrhosis ; Colitis; Crohns; Hepatitis A; Hepatitis B; Hepatitis C Endocrine Medical History: Positive for: Type II Diabetes Time with diabetes: 20 years Treated with: Insulin Blood sugar tested every day: Yes Tested : 2-3 Genitourinary Medical History: Past Medical History Notes: AKI Immunological Medical History: Negative for: Lupus Erythematosus; Raynauds; Scleroderma JHOEL, ARTHER R ([id number]) [id number] Page 12 of 13 Musculoskeletal Medical History: Positive for: Osteomyelitis - 2023 Neurologic Medical History: Positive for: Neuropathy - Bila lower extremities Oncologic Medical History: Negative for: Received Chemotherapy; Received Radiation Psychiatric Medical History: Negative for: Anorexia/bulimia; Confinement Anxiety HBO Extended History Items Eyes: Cataracts Immunizations Pneumococcal Vaccine: Received Pneumococcal Vaccination: Yes Received Pneumococcal Vaccination On or After 60th Birthday: Yes Implantable Devices Yes Hospitalization / Surgery History Type of Hospitalization/Surgery I and D Left calcaneus back surgery- laminectomy eye surgery- Bila cataracts shoulder arthroscopy Family and Social History Cancer: Yes - Father,Mother; Diabetes: Yes - Mother,Father; Heart Disease: No; Hereditary Spherocytosis: No; Hypertension: No; Kidney Disease: No; Lung Disease: Yes - Father; Seizures: No; Stroke: No; Thyroid Problems: Yes - Mother; Tuberculosis: No; Never smoker; Marital Status - Married; Alcohol Use: Never; Drug Use: No History; Caffeine Use: Daily; Financial Concerns: No; Food, Clothing or Shelter Needs: No; Support System Lacking: No; Transportation Concerns: No Electronic Signature(s) Signed: 06/24/2023 11:13:08 AM By: [name] MD FACS Entered By: [name] on 06/24/2023  11:00:36 -------------------------------------------------------------------------------- SuperBill Details Patient Name: Date of Service: [name], [name] 06/24/2023 Medical Record Number: [id number] Patient Account Number: [id number] Date of Birth/Sex: Treating RN: 05-19-1956 (67 y.o. 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, left ankle and foot E11.65 Type 2 diabetes mellitus with hyperglycemia E11.621 Type 2 diabetes mellitus with foot ulcer Facility Procedures : The patient participates with Medicare or their insurance follows the Medicare Facility Guidelines: CPT4 Code Description Modifier Quantity [phone] - DEBRIDE WOUND 1ST 20 SQ CM OR < 1 ICD-10 Diagnosis Description L97.424 Non-pressure chronic ulcer  of left heel and midfoot with necrosis of bone : The patient participates with Medicare or their insurance follows the Medicare Facility Guidelines: [id number] - DEBRIDE WOUND EA ADDL 20 SQ CM 1 ICD-10 Diagnosis Description 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 : The patient participates with Medicare or their insurance follows the Medicare Facility Guidelines: [id number] - CHEM CAUT GRANULATION TISS 1 ICD-10 Diagnosis Description L97.523 Non-pressure chronic ulcer of other part of left foot with necrosis of  muscle Physician Procedures : CPT4 Code Description Modifier [id number] - WC PHYS LEVEL 4 - EST PT 25 ICD-10 Diagnosis Description L97.424 Non-pressure chronic  ulcer of left heel and midfoot with necrosis of bone L97.523 Non-pressure chronic ulcer of other part of left foot with  necrosis of 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, 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 Quantity: 1 : [id number] 97597 - WC PHYS DEBR WO ANESTH 20 SQ CM ICD-10 Diagnosis Description L97.424 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

## 2023-06-28 ENCOUNTER — Ambulatory Visit: Payer: Self-pay | Admitting: *Deleted

## 2023-06-28 NOTE — Patient Outreach (Signed)
  Care Coordination   Follow Up Visit Note   06/28/2023 Name: [name] MRN: [id number] DOB: June 28, 1956  [name] is a 67 y.o. year old male who sees [name], MD for primary care. I spoke with  [name] by phone today.  What matters to the patients health and wellness today?  Disappointed his part time job has not reached out recently for any work- continues with home stressors.    Goals Addressed             This Visit's Progress    Planning for care needs/improve quality of life       Activities and task to complete in order to accomplish goals.   Glad you met with your Therapist, [name],  with next counseling session today- discuss the concerns voiced to me today for his assistance with coping/interventions Call your insurance provider for more information about your Enhanced Benefits  Plan to attend 07/21/23 Amputee Support Group either in-person or online   Start / continue healthy coping activities including relaxed breathing, mindfulness techniques, etc (will email some info to you)  Keep all upcoming appointments Continue with compliance of taking medication prescribed by Doctor Glad you enjoyed your first meet up/lunch with your 2 peer support men-  Make list of questions/concerns, etc to take with you to your next meet up wit them   Await call from [facility]- for hopeful help with providing assistance with light housekeeping, laundry, errands and some personal care  Consider private pay home care options if needed/desired to help with wife's care needs while you are hospitalized and rehabilitating Review insurance policy to see if you have Long-term care insurance or a custodial care benefit Complete Advance Directive packet,  Have advance directive notarized and provide a copy to provider office  Follow up on things we discussed today regarding concerns related to spouse Congrats on the part-time job- inquire about the  "as needed" and consider another option if needed         SDOH assessments and interventions completed:  Yes     Care Coordination Interventions:  Yes, provided  Interventions Today    Flowsheet Row Most Recent Value  Chronic Disease   Chronic disease during today's visit Other  [stress/depression]  General Interventions   General Interventions Discussed/Reviewed [employer]  Education Interventions   Provided Verbal Education On Mental Health/Coping with Illness  Mental Health Interventions   Mental Health Discussed/Reviewed Mental Health Discussed, Coping Strategies, Depression  [Pt is planning to see his Therapist today for 2nd session. Encouraged pt to also attend the Support Group for Amputees as previously planned and discussed. Also, to follow up with his Peer Support friends]       Follow up plan: Follow up call scheduled for 07/22/23    Encounter Outcome:  Pt. Visit Completed

## 2023-06-28 NOTE — Patient Instructions (Signed)
Visit Information  Thank you for taking time to visit with me today. Please don't hesitate to contact me if I can be of assistance to you.   Following are the goals we discussed today:   Goals Addressed             This Visit's Progress    Planning for care needs/improve quality of life       Activities and task to complete in order to accomplish goals.   Glad you met with your Therapist, [name],  with next counseling session today- discuss the concerns voiced to me today for his assistance with coping/interventions Call your insurance provider for more information about your Enhanced Benefits  Plan to attend 07/21/23 Amputee Support Group either in-person or online   Start / continue healthy coping activities including relaxed breathing, mindfulness techniques, etc (will email some info to you)  Keep all upcoming appointments Continue with compliance of taking medication prescribed by Doctor Glad you enjoyed your first meet up/lunch with your 2 peer support men-  Make list of questions/concerns, etc to take with you to your next meet up wit them   Await call from [facility]- for hopeful help with providing assistance with light housekeeping, laundry, errands and some personal care  Consider private pay home care options if needed/desired to help with wife's care needs while you are hospitalized and rehabilitating Review insurance policy to see if you have Long-term care insurance or a custodial care benefit Complete Advance Directive packet,  Have advance directive notarized and provide a copy to provider office  Follow up on things we discussed today regarding concerns related to spouse Congrats on the part-time job- inquire about the "as needed" and consider another option if needed         Our next appointment is by telephone on 07/22/23  Please call the care guide team at [phone] if you need to cancel or reschedule your appointment.   If you are  experiencing a Mental Health or Behavioral Health Crisis or need someone to talk to, please call the Suicide and Crisis Lifeline: 988 call 911   The patient verbalized understanding of instructions, educational materials, and care plan provided today and DECLINED offer to receive copy of patient instructions, educational materials, and care plan.   Telephone follow up appointment with care management team member scheduled for: 07/22/23  Reece Levy, MSW, LCSW Clinical Social Worker [facility] [phone]

## 2023-07-11 ENCOUNTER — Ambulatory Visit (HOSPITAL_BASED_OUTPATIENT_CLINIC_OR_DEPARTMENT_OTHER): Payer: Medicare Other | Admitting: General Surgery

## 2023-07-22 ENCOUNTER — Ambulatory Visit: Payer: Self-pay | Admitting: *Deleted

## 2023-07-22 ENCOUNTER — Encounter (HOSPITAL_BASED_OUTPATIENT_CLINIC_OR_DEPARTMENT_OTHER): Payer: Medicare Other | Attending: General Surgery | Admitting: General Surgery

## 2023-07-22 DIAGNOSIS — Z794 Long term (current) use of insulin: Secondary | ICD-10-CM | POA: Insufficient documentation

## 2023-07-22 DIAGNOSIS — I10 Essential (primary) hypertension: Secondary | ICD-10-CM | POA: Diagnosis not present

## 2023-07-22 DIAGNOSIS — M86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, left ankle and foot: Secondary | ICD-10-CM | POA: Insufficient documentation

## 2023-07-22 DIAGNOSIS — E11621 Type 2 diabetes mellitus with foot ulcer: Secondary | ICD-10-CM | POA: Diagnosis not present

## 2023-07-22 DIAGNOSIS — L97523 Non-pressure chronic ulcer of other part of left foot with necrosis of muscle: Secondary | ICD-10-CM | POA: Insufficient documentation

## 2023-07-22 DIAGNOSIS — E1165 Type 2 diabetes mellitus with hyperglycemia: Secondary | ICD-10-CM | POA: Insufficient documentation

## 2023-07-22 DIAGNOSIS — L97424 Non-pressure chronic ulcer of left heel and midfoot with necrosis of bone: Secondary | ICD-10-CM | POA: Diagnosis present

## 2023-07-22 NOTE — Patient Outreach (Signed)
  Care Coordination   Follow Up Visit Note   07/22/2023 Name: Aleph Sparhawk MRN: [id number] DOB: [birth year]  Marrian Salvage Meckley is a 67 y.o. year old male who sees [name], MD for primary care. I spoke with  [name] by phone today.  What matters to the patients health and wellness today?  Hopeful for part time job that will allow him to stay off his foot and make some additional income.     Goals Addressed             This Visit's Progress    Planning for care needs/improve quality of life       Activities and task to complete in order to accomplish goals.   Glad you continue to see your Therapist, [name]- discuss the concerns voiced to me today for his assistance with coping/interventions Call your insurance provider for more information about your Enhanced Benefits  Plan to attend 08/25/23 Amputee Support Group either in-person or online   Start / continue healthy coping activities including relaxed breathing, mindfulness techniques, etc (will email some info to you)  Keep all upcoming appointments Continue with compliance of taking medication prescribed by Doctor Glad you enjoyed your first meet up/lunch with your 2 peer support men-  Make list of questions/concerns, etc to take with you to your next meet up wit them   Await call from [facility]- for hopeful help with providing assistance with light housekeeping, laundry, errands and some personal care  Consider private pay home care options if needed/desired to help with wife's care needs while you are hospitalized and rehabilitating Review insurance policy to see if you have Long-term care insurance or a custodial care benefit Complete Advance Directive packet,  Have advance directive notarized and provide a copy to provider office  Follow up on things we discussed today regarding concerns related to spouse Continue to seek part-time job- inquire about the "as needed" and consider another  option if needed         SDOH assessments and interventions completed:  Yes     Care Coordination Interventions:  Yes, provided  Interventions Today    Flowsheet Row Most Recent Value  Chronic Disease   Chronic disease during today's visit Other  [depression/stress]  General Interventions   General Interventions Discussed/Reviewed Community Resources  Mental Health Interventions   Mental Health Discussed/Reviewed --  [Pt is seeing "[name]" for counseling and reports it is going well- CSW encouraged him to attend Amputee Support Group 08/25/23. He is also seeking part time work, focusing on "living my life" and pleased his top foot wound is almost healed-]       Follow up plan: Follow up call scheduled for 08/23/23    Encounter Outcome:  Pt. Visit Completed

## 2023-07-22 NOTE — Progress Notes (Signed)
GREYLIN, THORLEY R ([id number]) [id number] Page 1 of 9 Visit Report for 07/22/2023 Arrival Information Details Patient Name: Date of Service: [place], [name] 07/22/2023 9:15 A M Medical Record Number: [id number] Patient Account Number: [id number] Date of Birth/Sex: Treating RN: 25-Nov-1956 (67 y.o. M) Primary Care Nyelli Samara: Foye [name] Other Clinician: Referring Menashe Kafer: Treating Moneka Mcquinn/Extender: Kandis Cocking in Treatment: 45 Visit Information History Since Last Visit All ordered tests and consults were completed: No Patient Arrived: Knee Scooter Added or deleted any medications: No Arrival Time: 08:57 Any new allergies or adverse reactions: No Accompanied By: self Had a fall or experienced change in No Transfer Assistance: None activities of daily living that may affect Patient Identification Verified: Yes risk of falls: Secondary Verification Process Completed: Yes Signs or symptoms of abuse/neglect since last visito No Patient Requires Transmission-Based Precautions: No Hospitalized since last visit: No Patient Has Alerts: No Implantable device outside of the clinic excluding No cellular tissue based products placed in the center since last visit: Has Dressing in Place as Prescribed: Yes Pain Present Now: No Electronic Signature(s) Signed: 07/22/2023 12:54:25 PM By: Zenaida Deed RN, BSN Entered By: Zenaida Deed on 07/22/2023 08:59:34 -------------------------------------------------------------------------------- Encounter Discharge Information Details Patient Name: Date of Service: [place], [name] 07/22/2023 9:15 A M Medical Record Number: [id number] Patient Account Number: [id number] Date of Birth/Sex: Treating RN: 1956-11-21 (67 y.o. [name] Primary Care Aimar Shrewsbury: Foye [name] Other Clinician: Referring Lynden Flemmer: Treating Zorawar Strollo/Extender: Kandis Cocking in  Treatment: 45 Encounter Discharge Information Items Post Procedure Vitals Discharge Condition: Stable Temperature (F): 98.9 Ambulatory Status: Crutches Pulse (bpm): 78 Discharge Destination: Home Respiratory Rate (breaths/min): 18 Transportation: Private Auto Blood Pressure (mmHg): 166/83 Accompanied By: self Schedule Follow-up Appointment: Yes Clinical Summary of Care: Patient Declined Electronic Signature(s) Signed: 07/22/2023 12:54:25 PM By: Zenaida Deed RN, BSN Entered By: Zenaida Deed on 07/22/2023 09:43:32 Perfetti, Windy Fast R ([id number]) [id number] Page 2 of 9 -------------------------------------------------------------------------------- Lower Extremity Assessment Details Patient Name: Date of Service: [place], [name] 07/22/2023 9:15 A M Medical Record Number: [id number] Patient Account Number: [id number] Date of Birth/Sex: Treating RN: 1956/10/19 (67 y.o. [name] Primary Care Dustine Stickler: Foye [name] Other Clinician: Referring Odaly Peri: Treating Loie Jahr/Extender: Kandis Cocking in Treatment: 45 Edema Assessment Assessed: Kyra Searles: No] [name]: No] Edema: [Left: Ye] [Right: s] Calf Left: Right: Point of Measurement: From Medial Instep 47.2 cm Ankle Left: Right: Point of Measurement: From Medial Instep 25 cm Vascular Assessment Pulses: Dorsalis Pedis Palpable: [Left:Yes] Extremity colors, hair growth, and conditions: Extremity Color: [Left:Hyperpigmented] Hair Growth on Extremity: [Left:No] Temperature of Extremity: [Left:Warm < 3 seconds] Electronic Signature(s) Signed: 07/22/2023 12:54:25 PM By: Zenaida Deed RN, BSN Entered By: Zenaida Deed on 07/22/2023 09:06:11 -------------------------------------------------------------------------------- Multi Wound Chart Details Patient Name: Date of Service: [name], [name] 07/22/2023 9:15 A M Medical Record Number: [id number] Patient Account Number:  [id number] Date of Birth/Sex: Treating RN: Sep 07, 1956 (67 y.o. [name] Primary Care Anvitha Hutmacher: Foye [name] Other Clinician: Referring Sherlin Sonier: Treating Jerelene Salaam/Extender: Kandis Cocking in Treatment: 45 Vital Signs Height(in): 74 Capillary Blood Glucose(mg/dl): 536 Weight(lbs): 644 Pulse(bpm): 78 Body Mass Index(BMI): 31.5 Blood Pressure(mmHg): 166/83 Temperature(F): 98.9 Respiratory Rate(breaths/min): 18 [1:Photos:] [N/A:N/A 330 200 0405.pdf Page 3 of 9] Left Calcaneus Left, Dorsal Foot N/A Wound Location: Pressure Injury Pressure Injury N/A Wounding Event: Diabetic Wound/Ulcer of the Lower Diabetic Wound/Ulcer of the Lower N/A Primary Etiology: Extremity Extremity Cataracts, Sleep Apnea, Hypertension, Cataracts, Sleep Apnea, Hypertension, N/A  Comorbid History: Type II Diabetes, Osteomyelitis, Type II Diabetes, Osteomyelitis, Neuropathy Neuropathy 03/09/2019 07/09/2022 N/A Date Acquired: 31 45 N/A Weeks of Treatment: Open Open N/A Wound Status: No No N/A Wound Recurrence: No Yes N/A Clustered Wound: N/A 2 N/A Clustered Quantity: 6.4x5.5x1.2 0.1x0.1x0.1 N/A Measurements L x W x D (cm) 27.646 0.008 N/A A (cm) : rea 33.175 0.001 N/A Volume (cm) : -28.00% 99.90% N/A % Reduction in A rea: -53.60% 99.90% N/A % Reduction in Volume: 6 Starting Position 1 (o'clock): 8 Ending Position 1 (o'clock): 2 Maximum Distance 1 (cm): Yes No N/A Undermining: Grade 3 Grade 2 N/A Classification: Large Small N/A Exudate A mount: Purulent Serous N/A Exudate Type: yellow, brown, green amber N/A Exudate Color: Thickened Flat and Intact N/A Wound Margin: Medium (34-66%) None Present (0%) N/A Granulation A mount: Pink, Pale, Hyper-granulation N/A N/A Granulation Quality: Medium (34-66%) None Present (0%) N/A Necrotic A mount: Fat Layer (Subcutaneous Tissue): Yes Fat Layer (Subcutaneous Tissue): Yes N/A Exposed  Structures: Fascia: No Fascia: No Tendon: No Tendon: No Muscle: No Muscle: No Joint: No Joint: No Bone: No Bone: No Small (1-33%) Large (67-100%) N/A Epithelialization: Debridement - Selective/Open Wound N/A N/A Debridement: Pre-procedure Verification/Time Out 09:20 N/A N/A Taken: Callus, Slough N/A N/A Tissue Debrided: Skin/Epidermis N/A N/A Level: 34.54 N/A N/A Debridement A (sq cm): rea Curette N/A N/A Instrument: Minimum N/A N/A Bleeding: Pressure N/A N/A Hemostasis A chieved: 0 N/A N/A Procedural Pain: 0 N/A N/A Post Procedural Pain: Procedure was tolerated well N/A N/A Debridement Treatment Response: 6.4x5.5x1.2 N/A N/A Post Debridement Measurements L x W x D (cm) 33.175 N/A N/A Post Debridement Volume: (cm) Callus: Yes Excoriation: No N/A Periwound Skin Texture: Scarring: No Maceration: Yes Dry/Scaly: Yes N/A Periwound Skin Moisture: Dry/Scaly: No Maceration: No Erythema: No Rubor: No N/A Periwound Skin Color: Rubor: No No Abnormality No Abnormality N/A Temperature: Debridement N/A N/A Procedures Performed: Treatment Notes Electronic Signature(s) Signed: 07/22/2023 9:31:52 AM By: [name] MD FACS Signed: 07/22/2023 12:54:25 PM By: Zenaida Deed RN, BSN Entered By: [name] on 07/22/2023 09:31:51 JOVANNIE, LEMON R ([id number]) [id number] Page 4 of 9 -------------------------------------------------------------------------------- Multi-Disciplinary Care Plan Details Patient Name: Date of Service: [place], [name] 07/22/2023 9:15 A M Medical Record Number: [id number] Patient Account Number: [id number] Date of Birth/Sex: Treating RN: [birth year] (67 y.o. [name] Primary Care Richey Doolittle: Foye [name] Other Clinician: Referring Shereese Bonnie: Treating Stefen Juba/Extender: Kandis Cocking in Treatment: 45 Multidisciplinary Care Plan reviewed with physician Active  Inactive Nutrition Nursing Diagnoses: Imbalanced nutrition Impaired glucose control: actual or potential Potential for alteratiion in Nutrition/Potential for imbalanced nutrition Goals: Patient/caregiver will maintain therapeutic glucose control Date Initiated: 05/27/2023 Target Resolution Date: 08/19/2023 Goal Status: Active Interventions: Assess HgA1c results as ordered upon admission and as needed Assess patient nutrition upon admission and as needed per policy Provide education on elevated blood sugars and impact on wound healing Treatment Activities: Patient referred to Primary Care Physician for further nutritional evaluation : 05/27/2023 Notes: Wound/Skin Impairment Nursing Diagnoses: Impaired tissue integrity Goals: Patient/caregiver will verbalize understanding of skin care regimen Date Initiated: 10/06/2022 Target Resolution Date: 08/19/2023 Goal Status: Active Ulcer/skin breakdown will have a volume reduction of 30% by week 4 Date Initiated: 09/10/2022 Date Inactivated: 10/06/2022 Target Resolution Date: 10/08/2022 Goal Status: Unmet Unmet Reason: osteo, HBOT Ulcer/skin breakdown will have a volume reduction of 50% by week 8 Date Initiated: 10/06/2022 Date Inactivated: 02/14/2023 Target Resolution Date: 01/14/2023 Unmet Reason: insufficient perfusion Goal Status: Unmet to Left lower extremity Ulcer/skin  breakdown will have a volume reduction of 80% by week 12 Date Initiated: 02/14/2023 Date Inactivated: 03/14/2023 Target Resolution Date: 03/15/2023 Goal Status: Unmet Unmet Reason: osteomyelitis Interventions: Assess ulceration(s) every visit Provide education on ulcer and skin care Treatment Activities: Consult for HBO : 09/10/2022 Skin care regimen initiated : 09/10/2022 Notes: NPWT started 12/27/22 Electronic Signature(s) Signed: 07/22/2023 12:54:25 PM By: Zenaida Deed RN, BSN Entered By: Zenaida Deed on 07/22/2023 09:13:58 [name] ([id number])  [id number] Page 5 of 9 -------------------------------------------------------------------------------- Pain Assessment Details Patient Name: Date of Service: [place], [name] 07/22/2023 9:15 A M Medical Record Number: [id number] Patient Account Number: [id number] Date of Birth/Sex: Treating RN: Jul 26, 1956 (67 y.o. M) Primary Care Gaye Scorza: Foye [name] Other Clinician: Referring Adwoa Axe: Treating Radwan Cowley/Extender: Kandis Cocking in Treatment: 45 Active Problems Location of Pain Severity and Description of Pain Patient Has Paino No Site Locations Rate the pain. Current Pain Level: 0 Pain Management and Medication Current Pain Management: Electronic Signature(s) Signed: 07/22/2023 12:54:25 PM By: Zenaida Deed RN, BSN Entered By: Zenaida Deed on 07/22/2023 08:59:57 -------------------------------------------------------------------------------- Patient/Caregiver Education Details Patient Name: Date of Service: [name] NA LD R. 8/9/2024andnbsp9:15 A M Medical Record Number: [id number] Patient Account Number: [id number] Date of Birth/Gender: Treating RN: 01-Jun-1956 (67 y.o. [name] Primary Care Physician: Foye [name] Other Clinician: Referring Physician: Treating Physician/Extender: Kandis Cocking in Treatment: 45 Education Assessment Education Provided To: Patient Education Topics Provided Elevated Blood Sugar/ Impact on Healing: Methods: Explain/Verbal Responses: Reinforcements needed, State content correctly Offloading: Methods: Explain/Verbal Responses: Reinforcements needed, State content correctly BASIL, GADISON ([id number]) [id number].pdf Page 6 of 9 Wound/Skin Impairment: Methods: Explain/Verbal Responses: Reinforcements needed, State content correctly Electronic Signature(s) Signed: 07/22/2023 12:54:25 PM By: Zenaida Deed RN, BSN Entered  By: Zenaida Deed on 07/22/2023 09:14:29 -------------------------------------------------------------------------------- Wound Assessment Details Patient Name: Date of Service: [place], [name] 07/22/2023 9:15 A M Medical Record Number: [id number] Patient Account Number: [id number] Date of Birth/Sex: Treating RN: 10-10-1956 (67 y.o. [name] Primary Care Zavian Slowey: Foye [name] Other Clinician: Referring Sama Arauz: Treating Lorah Kalina/Extender: Kandis Cocking in Treatment: 45 Wound Status Wound Number: 1 Primary Diabetic Wound/Ulcer of the Lower Extremity Etiology: Wound Location: Left Calcaneus Wound Open Wounding Event: Pressure Injury Status: Date Acquired: 03/09/2019 Comorbid Cataracts, Sleep Apnea, Hypertension, Type II Diabetes, Weeks Of Treatment: 45 History: Osteomyelitis, Neuropathy Clustered Wound: No Photos Wound Measurements Length: (cm) 6.4 Width: (cm) 5.5 Depth: (cm) 1.2 Area: (cm) 27.646 Volume: (cm) 33.175 % Reduction in Area: -28% % Reduction in Volume: -53.6% Epithelialization: Small (1-33%) Undermining: Yes Starting Position (o'clock): 6 Ending Position (o'clock): 8 Maximum Distance: (cm) 2 Wound Description Classification: Grade 3 Wound Margin: Thickened Exudate Amount: Large Exudate Type: Purulent Exudate Color: yellow, brown, green Foul Odor After Cleansing: No Slough/Fibrino Yes Wound Bed Granulation Amount: Medium (34-66%) Exposed Structure Granulation Quality: Pink, Pale, Hyper-granulation Fascia Exposed: No Necrotic Amount: Medium (34-66%) Fat Layer (Subcutaneous Tissue) Exposed: Yes Necrotic Quality: Adherent Slough Tendon Exposed: No Muscle Exposed: No Joint Exposed: No YAZN, ETUE ([id number]) [id number] Page 7 of 9 Bone Exposed: No Periwound Skin Texture Texture Color No Abnormalities Noted: No No Abnormalities Noted: Yes Callus: Yes Temperature /  Pain Temperature: No Abnormality Moisture No Abnormalities Noted: No Dry / Scaly: No Maceration: Yes Treatment Notes Wound #1 (Calcaneus) Wound Laterality: Left Cleanser Soap and Water Discharge Instruction: May shower and wash wound with dial antibacterial soap and water prior to dressing change. Vashe 5.8 (oz) Discharge  Instruction: Cleanse the wound with Vashe prior to applying a clean dressing using gauze sponges, not tissue or cotton balls. Peri-Wound Care Zinc Oxide Ointment 30g tube Discharge Instruction: Apply Zinc Oxide to periwound with each dressing change Topical Gentamicin Discharge Instruction: Apply to wound when keystone not available Mupirocin Ointment Discharge Instruction: when keystone not available apply Mupirocin (Bactroban) as instructed keystone antibiotic compound Primary Dressing Maxorb Extra Ag+ Alginate Dressing, 4x4.75 (in/in) Discharge Instruction: Apply to wound bed as instructed Secondary Dressing ABD Pad, 8x10 Discharge Instruction: Apply over primary dressing as directed. Woven Gauze Sponge, Non-Sterile 4x4 in Discharge Instruction: Wet with Vashe we to dry. Zetuvit Absorbent Pad, 4x4 (in/in) Discharge Instruction: apply over primary dressing Secured With Coban Self-Adherent Wrap 4x5 (in/yd) Discharge Instruction: Secure with Coban as directed. Kerlix Roll Sterile, 4.5x3.1 (in/yd) Discharge Instruction: Secure with Kerlix as directed. Compression Wrap Compression Stockings Add-Ons Electronic Signature(s) Signed: 07/22/2023 12:54:25 PM By: Zenaida Deed RN, BSN Entered By: Zenaida Deed on 07/22/2023 09:17:37 -------------------------------------------------------------------------------- Wound Assessment Details Patient Name: Date of Service: [place], [name] 07/22/2023 9:15 A M Medical Record Number: [id number] Patient Account Number: [id number] Date of Birth/Sex: Treating RN: 09-Jun-1956 (67 y.o. Orvid, [place]atta, Oklee R  ([id number]) [id number] Page 8 of 9 Primary Care Chau Sawin: Foye [name] Other Clinician: Referring Wiley Flicker: Treating [place] Lesure/Extender: Kandis Cocking in Treatment: 45 Wound Status Wound Number: 2 Primary Diabetic Wound/Ulcer of the Lower Extremity Etiology: Wound Location: Left, Dorsal Foot Wound Open Wounding Event: Pressure Injury Status: Date Acquired: 07/09/2022 Comorbid Cataracts, Sleep Apnea, Hypertension, Type II Diabetes, Weeks Of Treatment: 45 History: Osteomyelitis, Neuropathy Clustered Wound: Yes Photos Wound Measurements Length: (cm) Width: (cm) Depth: (cm) Clustered Quantity: Area: (cm) Volume: (cm) 0.1 % Reduction in Area: 99.9% 0.1 % Reduction in Volume: 99.9% 0.1 Epithelialization: Large (67-100%) 2 Tunneling: No 0.008 Undermining: No 0.001 Wound Description Classification: Grade 2 Wound Margin: Flat and Intact Exudate Amount: Small Exudate Type: Serous Exudate Color: amber Foul Odor After Cleansing: No Slough/Fibrino Yes Wound Bed Granulation Amount: None Present (0%) Exposed Structure Necrotic Amount: None Present (0%) Fascia Exposed: No Fat Layer (Subcutaneous Tissue) Exposed: Yes Tendon Exposed: No Muscle Exposed: No Joint Exposed: No Bone Exposed: No Periwound Skin Texture Texture Color No Abnormalities Noted: No No Abnormalities Noted: Yes Excoriation: No Temperature / Pain Scarring: No Temperature: No Abnormality Moisture No Abnormalities Noted: No Dry / Scaly: Yes Maceration: No Treatment Notes Wound #2 (Foot) Wound Laterality: Dorsal, Left Cleanser Soap and Water Discharge Instruction: May shower and wash wound with dial antibacterial soap and water prior to dressing change. Vashe 5.8 (oz) Discharge Instruction: Cleanse the wound with Vashe prior to applying a clean dressing using gauze sponges, not tissue or cotton balls. Peri-Wound Care Topical DEVONTAI, KROL R  ([id number]) [id number] Page 9 of 9 Gentamicin Discharge Instruction: Apply to wound when keystone not available Mupirocin Ointment Discharge Instruction: when keystone not available apply Mupirocin (Bactroban) as instructed keystone Primary Dressing Maxorb Extra Ag+ Alginate Dressing, 2x2 (in/in) Discharge Instruction: Apply to wound bed as instructed Secondary Dressing Woven Gauze Sponge, Non-Sterile 4x4 in Discharge Instruction: Wet with Vashe we to dry. Zetuvit Plus 4x8 in Discharge Instruction: Apply over primary dressing as directed. Secured With [employer] 4x5 (in/yd) Discharge Instruction: Secure with Coban as directed. Kerlix Roll Sterile, 4.5x3.1 (in/yd) Discharge Instruction: Secure with Kerlix as directed. Compression Wrap Compression Stockings Add-Ons Electronic Signature(s) Signed: 07/22/2023 12:54:25 PM By: Zenaida Deed RN, BSN Entered By: Zenaida Deed on 07/22/2023 09:18:02 --------------------------------------------------------------------------------  Vitals Details Patient Name: Date of Service: [place], [name] 07/22/2023 9:15 A M Medical Record Number: [id number] Patient Account Number: [id number] Date of Birth/Sex: Treating RN: 04/08/1956 (67 y.o. M) Primary Care Argus Caraher: Foye [name] Other Clinician: Referring Sumit Branham: Treating Maxson Oddo/Extender: Kandis Cocking in Treatment: 45 Vital Signs Time Taken: 08:57 Temperature (F): 98.9 Height (in): 74 Pulse (bpm): 78 Weight (lbs): 245 Respiratory Rate (breaths/min): 18 Body Mass Index (BMI): 31.5 Blood Pressure (mmHg): 166/83 Capillary Blood Glucose (mg/dl): 696 Reference Range: 80 - 120 mg / dl Notes glucose per pt report this am Electronic Signature(s) Signed: 07/22/2023 12:54:25 PM By: Zenaida Deed RN, BSN Entered By: Zenaida Deed on 07/22/2023 08:59:49

## 2023-07-22 NOTE — Patient Instructions (Signed)
Visit Information  Thank you for taking time to visit with me today. Please don't hesitate to contact me if I can be of assistance to you.   Following are the goals we discussed today:   Goals Addressed             This Visit's Progress    Planning for care needs/improve quality of life       Activities and task to complete in order to accomplish goals.   Glad you continue to see your Therapist, [name]- discuss the concerns voiced to me today for his assistance with coping/interventions Call your insurance provider for more information about your Enhanced Benefits  Plan to attend 08/25/23 Amputee Support Group either in-person or online   Start / continue healthy coping activities including relaxed breathing, mindfulness techniques, etc (will email some info to you)  Keep all upcoming appointments Continue with compliance of taking medication prescribed by Doctor Glad you enjoyed your first meet up/lunch with your 2 peer support men-  Make list of questions/concerns, etc to take with you to your next meet up wit them   Await call from [facility]- for hopeful help with providing assistance with light housekeeping, laundry, errands and some personal care  Consider private pay home care options if needed/desired to help with wife's care needs while you are hospitalized and rehabilitating Review insurance policy to see if you have Long-term care insurance or a custodial care benefit Complete Advance Directive packet,  Have advance directive notarized and provide a copy to provider office  Follow up on things we discussed today regarding concerns related to spouse Continue to seek part-time job- inquire about the "as needed" and consider another option if needed         Our next appointment is by telephone on 08/23/23  Please call the care guide team at [phone] if you need to cancel or reschedule your appointment.   If you are experiencing a Mental Health or  Behavioral Health Crisis or need someone to talk to, please call the Suicide and Crisis Lifeline: 988 call 911   The patient verbalized understanding of instructions, educational materials, and care plan provided today and DECLINED offer to receive copy of patient instructions, educational materials, and care plan.   Telephone follow up appointment with care management team member scheduled for: 08/23/23  Reece Levy, MSW, LCSW Clinical Social Worker [facility] [employer] [facility] [phone]

## 2023-07-22 NOTE — Progress Notes (Signed)
WAFI, SIMOES ([id number]) [id number] Page 1 of 13 Visit Report for 07/22/2023 Chief Complaint Document Details Patient Name: Date of Service: [name], [name] 07/22/2023 9:15 A M Medical Record Number: [id number] Patient Account Number: [id number] Date of Birth/Sex: Treating RN: [birth year] (67 y.o. [name] Primary Care Provider: Foye [name] Other Clinician: Referring Provider: Treating Provider/Extender: Kandis Cocking in Treatment: 45 Information Obtained from: Patient Chief Complaint Patients presents for treatment of an open diabetic ulcer and evaluation for hyperbaric oxygen therapy Electronic Signature(s) Signed: 07/22/2023 9:32:00 AM By: [name] MD FACS Entered By: [name] on 07/22/2023 09:32:00 -------------------------------------------------------------------------------- Debridement Details Patient Name: Date of Service: [name], [name] 07/22/2023 9:15 A M Medical Record Number: [id number] Patient Account Number: [id number] Date of Birth/Sex: Treating RN: 02/19/1956 (67 y.o. [name] Primary Care Provider: Foye [name] Other Clinician: Referring Provider: Treating Provider/Extender: Kandis Cocking in Treatment: 45 Debridement Performed for Assessment: Wound #1 Left Calcaneus Performed By: Physician [name], MD Debridement Type: Debridement Severity of Tissue Pre Debridement: Necrosis of bone Level of Consciousness (Pre-procedure): Awake and Alert Pre-procedure Verification/Time Out Yes - 09:20 Taken: Start Time: 09:22 Percent of Wound Bed Debrided: 125% T Area Debrided (cm): otal 34.54 Tissue and other material debrided: Non-Viable, Callus, Slough, Skin: Epidermis, Slough Level: Skin/Epidermis Debridement Description: Selective/Open Wound Instrument: Curette Bleeding: Minimum Hemostasis Achieved: Pressure Procedural Pain: 0 Post  Procedural Pain: 0 Response to Treatment: Procedure was tolerated well Level of Consciousness (Post- Awake and Alert procedure): Post Debridement Measurements of Total Wound Length: (cm) 6.4 Width: (cm) 5.5 Depth: (cm) 1.2 Volume: (cm) 33.175 Character of Wound/Ulcer Post Debridement: Stable Severity of Tissue Post Debridement: Necrosis of bone [name], [name] ([id number]) [id number] Page 2 of 13 Post Procedure Diagnosis Same as Pre-procedure Notes Scribed for Dr. Lady Gary. by Zenaida Deed, RN Electronic Signature(s) Signed: 07/22/2023 9:37:54 AM By: [name] MD FACS Signed: 07/22/2023 12:54:25 PM By: Zenaida Deed RN, BSN Entered By: Zenaida Deed on 07/22/2023 09:26:13 -------------------------------------------------------------------------------- HPI Details Patient Name: Date of Service: [name], [name] 07/22/2023 9:15 A M Medical Record Number: [id number] Patient Account Number: [id number] Date of Birth/Sex: Treating RN: 1956-10-17 (67 y.o. [name] Primary Care Provider: Foye [name] Other Clinician: Referring Provider: Treating Provider/Extender: Kandis Cocking in Treatment: 45 History of Present Illness HPI Description: ADMISSION 09/10/2022 This is a 67 year old poorly controlled type II diabetic (last hemoglobin A1c 10.8%) who has had an ulcer on his heel for over 3 years. He has been seen in multiple wound care centers, including [facility] and [facility]. He reports that at least 3 doctors have recommended that he undergo below-knee amputation. He most recently met with Dr. Reuel Boom, a vascular surgeon affiliated with [facility]. 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 Unfortunately, the patient has some extenuating social circumstances including the fact that he cares for his wife who has stage IV colon  cancer and still works, driving vehicles for [employer]. 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 He was recently hospitalized at [facility] for sepsis and currently has a PICC line through which he receives IV antibiotics. He reports having had another MRI during that hospital stay along with a chest x-ray and EKG. He apparently contacted one of the hyperbaric therapy techs here and asked a number of questions about hyperbaric oxygen treatments. He  subsequently self-referred to our center to undergo further evaluation and management. I mention to him that [facility] actually has hyperbaric chambers, but he states that he lives in [place] and this would be more convenient for him given the intensive nature of the therapy and time requirement. ABI in clinic today was 0.94. The patient actually has 2 wounds. There is a wound on the dorsum of his left foot with heavy black eschar and slough present. After debridement, this was demonstrated to involve the muscle and the extensor tendons are exposed. On his heel, there is essentially a "shark bite" type wound, with much of the heel fat pad absent. The muscle layer is exposed. There is blue-green staining around the perimeter of the wound, but no significant odor. He says he has been applying collagen to the wound on his heel and Silvadene and Betadine to the wound on his dorsal foot. 09/20/2022: The heel wound is quite macerated with wet periwound callus. There is slough accumulation on the surface. The dorsal foot wound looks better this week. There is still exposed tendon, but it is fairly clean with just a little biofilm buildup. We are still working on gathering the required documentation to submit for pretreatment review for hyperbaric oxygen therapy. 09/29/2022: The dorsal foot wound continues to improve. I do not see any exposed tendon at this point. There is just some slough accumulation on the wound surface.  He continues to have very wet macerated periwound callus on his heel. There is slough on the surface, but it is loose and thin. There is an area of undermining at the 11 o'clock position, but the overlying skin and subcutaneous tissue is healthy and viable. He has been approved for hyperbaric oxygen therapy and will start treatment tomorrow. 10/06/2022: Continued contraction and improvement of the dorsal foot wound. There is just a little bit of slough accumulation on the wound surface. The periwound callus continues to accumulate on the heel wound and it is quite macerated. It is also persistently found with blue-green staining present. He did initiate his hyperbaric oxygen therapy, but has had significant difficulty with decompression. He will be going to ENT to have PE tubes placed. 10/18/2022: His hyperbaric oxygen therapy is on hold while his otological issues are being addressed. He came in again today with the periwound skin on both the dorsal aspect of his foot and his calcaneus completely macerated. The blue-green discoloration, however, has abated and the undermining on the calcaneus has improved. The dorsal foot wound has also contracted somewhat. 10/26/2022: The dorsal foot wound continues to contract and fill with good granulation tissue. The heel, once again, has a rim of macerated callus, but the undermining and tunneling continues to contract. He has completed his oral antibiotics and has been using the [facility] topical compounded antibiotic for his dressing changes at home. He is scheduled to see ENT this afternoon. 11/08/2022: The dorsal foot wound is flush with the surrounding skin and has a good granulation tissue surface. There is some slough accumulation. As usual, the heel has a rim of macerated callus but the dimensions are smaller and the tunneling and undermining have contracted further. He has resumed his hyperbaric oxygen therapy. 12/12; patient seen for wound evaluation. He  is tolerating HBO although we could not dive him yesterday because of relative hypoglycemia and the fact he had given himself NovoLog insulin before he came to clinic. Today his blood sugar is in the 180 range she should be fine. He has a  large wound on the plantar calcaneus on the right and a more superficial area on the dorsal foot. He is using a scooter for offloading 11/30/2022: The dorsal foot wound continues to contract. There is some slough on the wound surface. The large calcaneus wound has heaped up wet callus around the margins. Continued undermining. 12/08/2022: The dorsal foot wound is flat and flush with the surrounding skin surface. There is some slough present. Once again, the calcaneal wound has HARJAS, CARE ([id number]) [phone].pdf Page 3 of 13 thick, absolutely macerated callus hanging off in tatters around the edges. The patient cannot explain to me why this part of his wound gets so wet. There is slough on the surface. The undermined portion of the wound has filled in. 12/17/2022: Earlier this week, he presented for his hyperbaric oxygen therapy and was hypotensive and ill-appearing. He also had what appeared to be an infected sebaceous cyst on his right upper arm. He was sent to the emergency department. He was given a fluid bolus and the ED provider lanced the cyst. He was prescribed doxycycline. He seems to be feeling better today. He notes that he has had copious drainage from his foot. On further questioning, he states that he has not been taking his oral diuretic. The dorsal foot wound has expanded somewhat, but is actually more superficial. The plantar heel wound is about the same. 12/27/2022: The dorsal foot wound has epithelialized further. The plantar foot wound is about the same size but has heaped up macerated callus around the perimeter. The intake nurse noted a tiny fragment of bone on his dressings and there is now an area at about the 5  o'clock position where 1 can palpate bone through a small slit in the soft tissues. The wound on his arm is contracting but he still likely has cyst wall present due to the manner in which the infected sebaceous cyst was addressed in the ER. 01/01/2023: The wound on his shoulder has contracted considerably. The dorsal foot wound has some slough on the surface but also looks to be improving. The plantar foot wound is basically unchanged. 01/10/2023: The wound on his shoulder continues to diminish in size and depth. The dorsal foot wound is improving with just a light layer of slough on the surface. The plantar foot wound appears to be contracting but still has thick wet callus around the perimeter. We did in chamber T cPO2 monitoring during his last hyperbaric oxygen therapy. The results show that he has extremely poor tissue perfusion at room air and 1 atm of pressure. He does respond extremely well to hyperbaric oxygen therapy, however. 01/17/2023: The wound on his shoulder is down to 0.6 cm in depth, down from 1 cm last week. The dorsal foot wound is quite a bit smaller this week with just some light slough on the surface. The plantar foot wound is also smaller and the area of bone that was exposed at the posterior heel is now covered. There is still substantial wet callus around the perimeter. He is scheduled to undergo formal vascular studies sometime next week. 01/24/2023: The wound on his shoulder has almost completely filled in, with just a little bit of depth remaining. The dorsal foot wound got macerated; it appears that his home health nurses are applying the drape for his heel wound over the top of this, meaning that the dorsal foot wound is not getting changed as regularly as it is supposed to. There is some slough on the surface.  The plantar foot wound has thick wet callus around the perimeter. The dimensions measure a little bit smaller. He is having his vascular studies done this  afternoon. 02/07/2023: The wound on his shoulder is healed. The dorsal foot wound continues to be subjected to excessive moisture due to the home health nurses applying the drape from his wound VAC over the top of it. It does measure smaller, however. There is more bone exposed on his calcaneus, unfortunately. He has heaped up wet callus around the edges of the heel wound again. He is going to undergo angiography with Dr. Lenell Antu on Friday. Hopefully there will be intervention available to him that may aid in his wound healing. 02/14/2023: He did not get his angiogram last week with Dr. Lenell Antu because he did not have a ride home. The dorsal foot wound looks a little bit better with just a light layer of slough. Unfortunately, there is more bone exposed at the calcaneus and I am concerned that we are nearing a point at which his leg will not be salvageable. 3/18; patient presents for follow-up. He was recently hospitalized on 3/11 for sepsis secondary to diabetic foot wound. He was dishcharged with Augmentin and doxycycline and plan is for 6 weeks of this. He currently has a wound VAC. Wound has declined in appearance. Amputation was recommended in the hospital however patient is not ready for this. Patient states he would like to stop HBO. He currently denies systemic signs of infection. 03/07/2023: The calcaneal wound continues to deteriorate. There is significant undermining around the posterior aspect of the calcaneus and bone is palpable all the way around. The dorsal foot wound has some leathery eschar and slough present. 03/14/2023: The dorsal foot wound is somewhat leathery with some dark eschar and slough present. The calcaneal wound is deteriorating further. There are fragments of the bone hanging loose. He does have an appointment with Dr. Lenell Antu next week on Tuesday to discuss amputation. 04/04/2023: The heel ulcer is larger, but shallower. There is heaped up with callus around the edges. The  dorsal foot wound is stable. He has appointment with Dr. Lenell Antu was rescheduled for next Tuesday. He is working on arrangements for his dog and his wife to facilitate below-knee amputation. 04/12/2023: The heel ulcer looks about the same. Bone is not exposed. There is moist callus heaped up around the edges. The dorsal foot wound is stable. He will be seeing Dr. Lenell Antu this afternoon to discuss amputation. 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 The dorsal foot wound is a little bit smaller and shallower. It is not as irritated as it often looks. There is slough on the surface. He met with Dr. Lenell Antu and is still not ready to pursue amputation at this time. 05/16/2023: The dorsal foot wound has gotten larger and there appears to be a lot of drainage resulting in periwound tissue maceration. The calcaneal wound also looks similar, with heaped up macerated callus around the edges and a layer of slough on the surface. 05/27/2023: No real change to his wounds. He is still avoiding amputation. 06/10/2023: The dorsal foot wound actually looks a little bit better this week. There is light slough on the surface. On the plantar heel wound, he has less wet callus accumulation. There is rubbery slough on the surface. 06/24/2023: The dorsal foot wound has epithelialized substantially and there are just 2 remaining open areas,  both with hypertrophic granulation tissue. The plantar heel wound has deteriorated and there are areas that now have bone easily identified. There is wet callus around the edges, as usual. 07/22/2023: The dorsal foot wound is nearly closed. There is just a tiny residual open area. The plantar heel wound has improved and the bone is covered. As usual, there is heaped up wet callus around the edges. Electronic Signature(s) Signed: 07/22/2023 9:32:51 AM By: [name] MD FACS Entered By: [name] on 07/22/2023 09:32:51 Physical Exam Details -------------------------------------------------------------------------------- [name] ([id number]) [id number] Page 4 of 13 Patient Name: Date of Service: [place], [name] 07/22/2023 9:15 A M Medical Record Number: [id number] Patient Account Number: [id number] Date of Birth/Sex: Treating RN: 1956/06/13 (67 y.o. [name] Primary Care Provider: Foye [name] Other Clinician: Referring Provider: Treating Provider/Extender: Kandis Cocking in Treatment: 45 Constitutional Hypertensive, asymptomatic. . . . no acute distress. Respiratory Normal work of breathing on room air. Notes 07/22/2023: The dorsal foot wound is nearly closed. There is just a tiny residual open area. The plantar heel wound has improved and the bone is covered. As usual, there is heaped up wet callus around the edges. Electronic Signature(s) Signed: 07/22/2023 9:35:16 AM By: [name] MD FACS Entered By: [name] on 07/22/2023 09:35:16 -------------------------------------------------------------------------------- Physician Orders Details Patient Name: Date of Service: [place], [name] 07/22/2023 9:15 A M Medical Record Number: [id number] Patient Account Number: [id number] Date of Birth/Sex: Treating RN: 22-Nov-1956 (67 y.o. 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 - Dr. Lady Gary Room 1 Return A Anesthetic Wound #1 Left Calcaneus (In clinic) Topical Lidocaine 4% applied to wound bed Wound #2 Left,Dorsal Foot (In clinic) Topical Lidocaine 4% applied to wound bed Bathing/ Shower/ Hygiene May shower and wash wound with soap and water. - use antibacterial soap Edema Control - Lymphedema / SCD / Other Left Lower Extremity Elevate legs to the level of the heart or above for 30 minutes daily and/or when sitting for 3-4 times a day throughout the day. Avoid standing for long periods of time. Moisturize legs daily. Compression stocking or Garment 30-40 mm/Hg pressure to: - left leg daily Off-Loading Wound #1 Left Calcaneus Other: - use knee scooter to ambulate, minimal weight bearing left foot PEJA, HARTTER ([id number]) [id number] Page 5 of 13 Wound Treatment Wound #1 - Calcaneus Wound Laterality: Left Cleanser: Soap and Water 1 x Per Day/30 Days Discharge Instructions: May shower and wash wound with dial antibacterial soap and water prior to dressing change. Cleanser: Vashe 5.8 (oz) 1 x Per Day/30 Days Discharge Instructions: Cleanse the wound with Vashe prior to applying a clean dressing using gauze sponges, not tissue or cotton balls. Peri-Wound Care: Zinc Oxide Ointment 30g tube 1 x Per Day/30 Days Discharge Instructions: Apply Zinc Oxide to periwound with each dressing change Topical: Gentamicin 1 x Per Day/30 Days Discharge Instructions: Apply to wound when keystone not available Topical: Mupirocin Ointment 1 x Per Day/30 Days Discharge Instructions: when [facility]  not available apply Mupirocin (Bactroban) as instructed Topical: keystone antibiotic compound 1 x Per Day/30 Days Prim Dressing: Maxorb Extra Ag+ Alginate Dressing, 4x4.75 (in/in) (Generic) 1 x Per Day/30 Days ary Discharge Instructions: Apply to wound bed as instructed Secondary  Dressing: ABD Pad, 8x10 (Generic) 1 x Per Day/30 Days Discharge Instructions: Apply over primary dressing as directed. Secondary Dressing: Woven Gauze Sponge, Non-Sterile 4x4 in (Generic) 1 x Per Day/30 Days Discharge Instructions: Wet with Vashe we to dry. Secondary Dressing: Zetuvit Absorbent Pad, 4x4 (in/in) (Dispense As Written) 1 x Per Day/30 Days Discharge Instructions: apply over primary dressing Secured With: Coban Self-Adherent Wrap 4x5 (in/yd) (Generic) 1 x Per Day/30 Days Discharge Instructions: Secure with Coban as directed. Secured With: [employer], 4.5x3.1 (in/yd) (Generic) 1 x Per Day/30 Days Discharge Instructions: Secure with Kerlix as directed. Wound #2 - Foot Wound Laterality: Dorsal, Left Cleanser: Soap and Water 1 x Per Day/30 Days Discharge Instructions: May shower and wash wound with dial antibacterial soap and water prior to dressing change. Cleanser: Vashe 5.8 (oz) 1 x Per Day/30 Days Discharge Instructions: Cleanse the wound with Vashe prior to applying a clean dressing using gauze sponges, not tissue or cotton balls. Topical: Gentamicin 1 x Per Day/30 Days Discharge Instructions: Apply to wound when keystone not available Topical: Mupirocin Ointment 1 x Per Day/30 Days Discharge Instructions: when keystone not available apply Mupirocin (Bactroban) as instructed Topical: keystone 1 x Per Day/30 Days Prim Dressing: Maxorb Extra Ag+ Alginate Dressing, 2x2 (in/in) (Generic) 1 x Per Day/30 Days ary Discharge Instructions: Apply to wound bed as instructed Secondary Dressing: Woven Gauze Sponge, Non-Sterile 4x4 in (Generic) 1 x Per Day/30 Days Discharge Instructions: Wet with Vashe we to dry. Secondary Dressing: Zetuvit Plus 4x8 in (Generic) 1 x Per Day/30 Days Discharge Instructions: Apply over primary dressing as directed. Secured With: Coban Self-Adherent Wrap 4x5 (in/yd) (Generic) 1 x Per Day/30 Days Discharge Instructions: Secure with Coban as  directed. Secured With: [employer], 4.5x3.1 (in/yd) (Generic) 1 x Per Day/30 Days Discharge Instructions: Secure with Kerlix as directed. Electronic Signature(s) Signed: 07/22/2023 9:37:54 AM By: [name] MD FACS Entered By: [name] on 07/22/2023 09:35:46 KAEDYN, HOWLE R ([id number]) [id number] Page 6 of 13 -------------------------------------------------------------------------------- Problem List Details Patient Name: Date of Service: [place], [name] 07/22/2023 9:15 A M Medical Record Number: [id number] Patient Account Number: [id number] Date of Birth/Sex: Treating RN: 1956/10/04 (67 y.o. 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 09/10/2022 No Yes L97.523 Non-pressure chronic ulcer of other part of left foot with necrosis of muscle 09/10/2022 No Yes N82.956 Other chronic osteomyelitis, left ankle and foot 09/10/2022 No Yes E11.65 Type 2 diabetes mellitus with hyperglycemia 09/10/2022 No Yes E11.621 Type 2 diabetes mellitus with foot ulcer 09/10/2022 No Yes Inactive Problems ICD-10 Code Description Active Date Inactive Date L72.3 Sebaceous cyst 12/17/2022 12/17/2022 Resolved Problems ICD-10 Code Description Active Date Resolved Date L98.492 Non-pressure chronic ulcer of skin of other sites with fat layer exposed 12/17/2022 01/03/2023 Electronic Signature(s) Signed: 07/22/2023 9:28:38 AM By: [name] MD FACS Entered By: [name] on 07/22/2023 09:28:37 -------------------------------------------------------------------------------- Progress Note Details Patient Name: Date of Service: [name], [name] 07/22/2023 9:15 A M Medical  Record Number: [id number] Patient Account Number: [id number] Date of Birth/Sex:  Treating RN: 1956-05-15 (67 y.o. Yusef, Such, [place] R ([id number]) [id number] Page 7 of 13 Primary Care Provider: Foye [name] Other Clinician: Referring Provider: Treating Provider/Extender: Kandis Cocking in Treatment: 45 Subjective Chief Complaint Information obtained from Patient Patients presents for treatment of an open diabetic ulcer and evaluation for hyperbaric oxygen therapy History of Present Illness (HPI) ADMISSION 09/10/2022 This is a 67 year old poorly controlled type II diabetic (last hemoglobin A1c 10.8%) who has had an ulcer on his heel for over 3 years. He has been seen in multiple wound care centers, including [facility] and [facility]. He reports that at least 3 doctors have recommended that he undergo below-knee amputation. He most recently met with Dr. Reuel Boom, a vascular surgeon affiliated with [facility]. 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 Unfortunately, the patient has some extenuating social circumstances including the fact that he cares for his wife who has stage IV colon cancer and still works, driving vehicles for [employer]. 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 He was recently hospitalized at [facility] for sepsis and currently has a PICC line through which he receives IV antibiotics. He reports having had another MRI during that hospital stay along with a chest x-ray and EKG. He apparently contacted one of the hyperbaric therapy techs here and asked a number of questions about hyperbaric oxygen treatments. He subsequently self-referred to our center to undergo further evaluation and management. I mention to him that [facility] actually has hyperbaric chambers, but he states that  he lives in [place] and this would be more convenient for him given the intensive nature of the therapy and time requirement. ABI in clinic today was 0.94. The patient actually has 2 wounds. There is a wound on the dorsum of his left foot with heavy black eschar and slough present. After debridement, this was demonstrated to involve the muscle and the extensor tendons are exposed. On his heel, there is essentially a "shark bite" type wound, with much of the heel fat pad absent. The muscle layer is exposed. There is blue-green staining around the perimeter of the wound, but no significant odor. He says he has been applying collagen to the wound on his heel and Silvadene and Betadine to the wound on his dorsal foot. 09/20/2022: The heel wound is quite macerated with wet periwound callus. There is slough accumulation on the surface. The dorsal foot wound looks better this week. There is still exposed tendon, but it is fairly clean with just a little biofilm buildup. We are still working on gathering the required documentation to submit for pretreatment review for hyperbaric oxygen therapy. 09/29/2022: The dorsal foot wound continues to improve. I do not see any exposed tendon at this point. There is just some slough accumulation on the wound surface. He continues to have very wet macerated periwound callus on his heel. There is slough on the surface, but it is loose and thin. There is an area of undermining at the 11 o'clock position, but the overlying skin and subcutaneous tissue is healthy and viable. He has been approved for hyperbaric oxygen therapy and will start treatment tomorrow. 10/06/2022: Continued contraction and improvement of the dorsal foot wound. There is just a little bit of slough accumulation on the wound surface. The periwound callus continues to accumulate on the heel wound and it is quite macerated. It is also persistently found  with blue-green staining present. He did initiate his  hyperbaric oxygen therapy, but has had significant difficulty with decompression. He will be going to ENT to have PE tubes placed. 10/18/2022: His hyperbaric oxygen therapy is on hold while his otological issues are being addressed. He came in again today with the periwound skin on both the dorsal aspect of his foot and his calcaneus completely macerated. The blue-green discoloration, however, has abated and the undermining on the calcaneus has improved. The dorsal foot wound has also contracted somewhat. 10/26/2022: The dorsal foot wound continues to contract and fill with good granulation tissue. The heel, once again, has a rim of macerated callus, but the undermining and tunneling continues to contract. He has completed his oral antibiotics and has been using the [facility] topical compounded antibiotic for his dressing changes at home. He is scheduled to see ENT this afternoon. 11/08/2022: The dorsal foot wound is flush with the surrounding skin and has a good granulation tissue surface. There is some slough accumulation. As usual, the heel has a rim of macerated callus but the dimensions are smaller and the tunneling and undermining have contracted further. He has resumed his hyperbaric oxygen therapy. 12/12; patient seen for wound evaluation. He is tolerating HBO although we could not dive him yesterday because of relative hypoglycemia and the fact he had given himself NovoLog insulin before he came to clinic. Today his blood sugar is in the 180 range she should be fine. He has a large wound on the plantar calcaneus on the right and a more superficial area on the dorsal foot. He is using a scooter for offloading 11/30/2022: The dorsal foot wound continues to contract. There is some slough on the wound surface. The large calcaneus wound has heaped up wet callus around the margins. Continued undermining. 12/08/2022: The dorsal foot wound is flat and flush with the surrounding skin surface. There is  some slough present. Once again, the calcaneal wound has thick, absolutely macerated callus hanging off in tatters around the edges. The patient cannot explain to me why this part of his wound gets so wet. There is slough on the surface. The undermined portion of the wound has filled in. 12/17/2022: Earlier this week, he presented for his hyperbaric oxygen therapy and was hypotensive and ill-appearing. He also had what appeared to be an infected sebaceous cyst on his right upper arm. He was sent to the emergency department. He was given a fluid bolus and the ED provider lanced the cyst. He was prescribed doxycycline. He seems to be feeling better today. He notes that he has had copious drainage from his foot. On further questioning, he states that he has not been taking his oral diuretic. The dorsal foot wound has expanded somewhat, but is actually more superficial. The plantar heel wound is about the same. 12/27/2022: The dorsal foot wound has epithelialized further. The plantar foot wound is about the same size but has heaped up macerated callus around the perimeter. The intake nurse noted a tiny fragment of bone on his dressings and there is now an area at about the 5 o'clock position where 1 can palpate bone through a small slit in the soft tissues. The wound on his arm is contracting but he still likely has cyst wall present due to the manner in which the infected sebaceous cyst was addressed in the ER. 01/01/2023: The wound on his shoulder has contracted considerably. The dorsal foot wound has some slough on the surface but also looks to  be improving. The plantar foot wound is basically unchanged. 01/10/2023: The wound on his shoulder continues to diminish in size and depth. The dorsal foot wound is improving with just a light layer of slough on the surface. The plantar foot wound appears to be contracting but still has thick wet callus around the perimeter. We did in chamber T cPO2 monitoring during  his last hyperbaric oxygen therapy. The results show that he has extremely poor tissue perfusion at room air and 1 atm of pressure. He does respond extremely well to hyperbaric oxygen therapy, however. 01/17/2023: The wound on his shoulder is down to 0.6 cm in depth, down from 1 cm last week. The dorsal foot wound is quite a bit smaller this week with just some light slough on the surface. The plantar foot wound is also smaller and the area of bone that was exposed at the posterior heel is now covered. There is still substantial wet callus around the perimeter. He is scheduled to undergo formal vascular studies sometime next week. ALVONTE, PIKER ([id number]) [id number] Page 8 of 13 01/24/2023: The wound on his shoulder has almost completely filled in, with just a little bit of depth remaining. The dorsal foot wound got macerated; it appears that his home health nurses are applying the drape for his heel wound over the top of this, meaning that the dorsal foot wound is not getting changed as regularly as it is supposed to. There is some slough on the surface. The plantar foot wound has thick wet callus around the perimeter. The dimensions measure a little bit smaller. He is having his vascular studies done this afternoon. 02/07/2023: The wound on his shoulder is healed. The dorsal foot wound continues to be subjected to excessive moisture due to the home health nurses applying the drape from his wound VAC over the top of it. It does measure smaller, however. There is more bone exposed on his calcaneus, unfortunately. He has heaped up wet callus around the edges of the heel wound again. He is going to undergo angiography with Dr. Lenell Antu on Friday. Hopefully there will be intervention available to him that may aid in his wound healing. 02/14/2023: He did not get his angiogram last week with Dr. Lenell Antu because he did not have a ride home. The dorsal foot wound looks a little bit  better with just a light layer of slough. Unfortunately, there is more bone exposed at the calcaneus and I am concerned that we are nearing a point at which his leg will not be salvageable. 3/18; patient presents for follow-up. He was recently hospitalized on 3/11 for sepsis secondary to diabetic foot wound. He was dishcharged with Augmentin and doxycycline and plan is for 6 weeks of this. He currently has a wound VAC. Wound has declined in appearance. Amputation was recommended in the hospital however patient is not ready for this. Patient states he would like to stop HBO. He currently denies systemic signs of infection. 03/07/2023: The calcaneal wound continues to deteriorate. There is significant undermining around the posterior aspect of the calcaneus and bone is palpable all the way around. The dorsal foot wound has some leathery eschar and slough present. 03/14/2023: The dorsal foot wound is somewhat leathery with some dark eschar and slough present. The calcaneal wound is deteriorating further. There are fragments of the bone hanging loose. He does have an appointment with Dr. Lenell Antu next week on Tuesday to discuss amputation. 04/04/2023: The heel ulcer is larger, but shallower. There is  heaped up with callus around the edges. The dorsal foot wound is stable. He has appointment with Dr. Lenell Antu was rescheduled for next Tuesday. He is working on arrangements for his dog and his wife to facilitate below-knee amputation. 04/12/2023: The heel ulcer looks about the same. Bone is not exposed. There is moist callus heaped up around the edges. The dorsal foot wound is stable. He will be seeing Dr. Lenell Antu this afternoon to discuss amputation. 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 The dorsal foot wound is a little bit smaller and shallower. It is not as irritated as it often looks. There is slough on the  surface. He met with Dr. Lenell Antu and is still not ready to pursue amputation at this time. 05/16/2023: The dorsal foot wound has gotten larger and there appears to be a lot of drainage resulting in periwound tissue maceration. The calcaneal wound also looks similar, with heaped up macerated callus around the edges and a layer of slough on the surface. 05/27/2023: No real change to his wounds. He is still avoiding amputation. 06/10/2023: The dorsal foot wound actually looks a little bit better this week. There is light slough on the surface. On the plantar heel wound, he has less wet callus accumulation. There is rubbery slough on the surface. 06/24/2023: The dorsal foot wound has epithelialized substantially and there are just 2 remaining open areas, both with hypertrophic granulation tissue. The plantar heel wound has deteriorated and there are areas that now have bone easily identified. There is wet callus around the edges, as usual. 07/22/2023: The dorsal foot wound is nearly closed. There is just a tiny residual open area. The plantar heel wound has improved and the bone is covered. As usual, there is heaped up wet callus around the edges. Patient History Family History Cancer - Father,Mother, Diabetes - Mother,Father, Lung Disease - Father, Thyroid Problems - Mother, No family history of Heart Disease, Hereditary Spherocytosis, Hypertension, Kidney Disease, Seizures, Stroke, Tuberculosis. Social History Never smoker, Marital Status - Married, Alcohol Use - Never, Drug Use - No History, Caffeine Use - Daily. Medical History Eyes Patient has history of Cataracts Denies history of Glaucoma, Optic Neuritis Ear/Nose/Mouth/Throat Denies history of Chronic sinus problems/congestion, Middle ear problems Respiratory Patient has history of Sleep Apnea Cardiovascular Patient has history of Hypertension Gastrointestinal Denies history of Cirrhosis , Colitis, Crohns, Hepatitis A, Hepatitis B, Hepatitis  C Endocrine Patient has history of Type II Diabetes Immunological Denies history of Lupus Erythematosus, Raynauds, Scleroderma Musculoskeletal Patient has history of Osteomyelitis - 2023 Neurologic Patient has history of Neuropathy - Bila lower extremities Oncologic Denies history of Received Chemotherapy, Received Radiation Psychiatric Denies history of Anorexia/bulimia, Confinement Anxiety Hospitalization/Surgery History - I and D Left calcaneus. - back surgery- laminectomy. - eye surgery- Bila cataracts. - shoulder arthroscopy. Medical A Surgical History Notes nd Cardiovascular hyperlipidemia Genitourinary AKI KIMARION, SOLIMAN ([id number]) [id number] Page 9 of 13 Objective Constitutional Hypertensive, asymptomatic. no acute distress. Vitals Time Taken: 8:57 AM, Height: 74 in, Weight: 245 lbs, BMI: 31.5, Temperature: 98.9 F, Pulse: 78 bpm, Respiratory Rate: 18 breaths/min, Blood Pressure: 166/83 mmHg, Capillary Blood Glucose: 197 mg/dl. General Notes: glucose per pt report this am Respiratory Normal work of breathing on room air. General Notes: 07/22/2023: The dorsal foot wound is nearly closed. There is just a tiny residual open area. The plantar heel wound has improved and the bone is covered.  As usual, there is heaped up wet callus around the edges. Integumentary (Hair, Skin) Wound #1 status is Open. Original cause of wound was Pressure Injury. The date acquired was: 03/09/2019. The wound has been in treatment 45 weeks. The wound is located on the Left Calcaneus. The wound measures 6.4cm length x 5.5cm width x 1.2cm depth; 27.646cm^2 area and 33.175cm^3 volume. There is Fat Layer (Subcutaneous Tissue) exposed. There is undermining starting at 6:00 and ending at 8:00 with a maximum distance of 2cm. There is a large amount of purulent drainage noted. The wound margin is thickened. There is medium (34-66%) pink, pale, hyper - granulation within the wound  bed. There is a medium (34-66%) amount of necrotic tissue within the wound bed including Adherent Slough. The periwound skin appearance had no abnormalities noted for color. The periwound skin appearance exhibited: Callus, Maceration. The periwound skin appearance did not exhibit: Dry/Scaly. Periwound temperature was noted as No Abnormality. Wound #2 status is Open. Original cause of wound was Pressure Injury. The date acquired was: 07/09/2022. The wound has been in treatment 45 weeks. The wound is located on the Left,Dorsal Foot. The wound measures 0.1cm length x 0.1cm width x 0.1cm depth; 0.008cm^2 area and 0.001cm^3 volume. There is Fat Layer (Subcutaneous Tissue) exposed. There is no tunneling or undermining noted. There is a small amount of serous drainage noted. The wound margin is flat and intact. There is no granulation within the wound bed. There is no necrotic tissue within the wound bed. The periwound skin appearance had no abnormalities noted for color. The periwound skin appearance exhibited: Dry/Scaly. The periwound skin appearance did not exhibit: Excoriation, Scarring, Maceration. Periwound temperature was noted as No Abnormality. 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 There was a Selective/Open Wound Skin/Epidermis Debridement with a total area of 34.54 sq cm performed by [name], MD. With the following instrument(s): Curette to remove Non-Viable tissue/material. Material removed includes Callus, Slough,  and Skin: Epidermis. No specimens were taken. A time out was conducted at 09:20, prior to the start of the procedure. A Minimum amount of bleeding was controlled with Pressure. The procedure was tolerated well with a pain level of 0 throughout and a pain level of 0 following the procedure. Post Debridement Measurements: 6.4cm length x 5.5cm width x 1.2cm depth; 33.175cm^3 volume. Character of Wound/Ulcer Post Debridement is stable. Severity of Tissue Post Debridement is: Necrosis of bone. Post procedure Diagnosis Wound #1: Same as Pre-Procedure General Notes: Scribed for Dr. Lady Gary. by Zenaida Deed, RN. Plan Follow-up Appointments: Return Appointment in 2 weeks. - Dr. Lady Gary Room 1 Anesthetic: Wound #1 Left Calcaneus: (In clinic) Topical Lidocaine 4% applied to wound bed Wound #2 Left,Dorsal Foot: (In clinic) Topical Lidocaine 4% applied to wound bed Bathing/ Shower/ Hygiene: TREVIS, NYLEN ([id number]) [id number] Page 10 of 13 May shower and wash wound with soap and water. - use antibacterial soap Edema Control - Lymphedema / SCD / Other: Elevate legs to the level of the heart or above for 30 minutes daily and/or when sitting for 3-4 times a day throughout the  day. Avoid standing for long periods of time. Moisturize legs daily. Compression stocking or Garment 30-40 mm/Hg pressure to: - left leg daily Off-Loading: Wound #1 Left Calcaneus: Other: - use knee scooter to ambulate, minimal weight bearing left foot WOUND #1: - Calcaneus Wound Laterality: Left Cleanser: Soap and Water 1 x Per Day/30 Days Discharge Instructions: May shower and wash wound with dial antibacterial soap and water prior to dressing change. Cleanser: Vashe 5.8 (oz) 1 x Per Day/30 Days Discharge Instructions: Cleanse the wound with Vashe prior to applying a clean dressing using gauze sponges, not tissue or cotton balls. Peri-Wound Care: Zinc Oxide Ointment 30g tube 1 x Per Day/30  Days Discharge Instructions: Apply Zinc Oxide to periwound with each dressing change Topical: Gentamicin 1 x Per Day/30 Days Discharge Instructions: Apply to wound when keystone not available Topical: Mupirocin Ointment 1 x Per Day/30 Days Discharge Instructions: when keystone not available apply Mupirocin (Bactroban) as instructed Topical: keystone antibiotic compound 1 x Per Day/30 Days Prim Dressing: Maxorb Extra Ag+ Alginate Dressing, 4x4.75 (in/in) (Generic) 1 x Per Day/30 Days ary Discharge Instructions: Apply to wound bed as instructed Secondary Dressing: ABD Pad, 8x10 (Generic) 1 x Per Day/30 Days Discharge Instructions: Apply over primary dressing as directed. Secondary Dressing: Woven Gauze Sponge, Non-Sterile 4x4 in (Generic) 1 x Per Day/30 Days Discharge Instructions: Wet with Vashe we to dry. Secondary Dressing: Zetuvit Absorbent Pad, 4x4 (in/in) (Dispense As Written) 1 x Per Day/30 Days Discharge Instructions: apply over primary dressing Secured With: Coban Self-Adherent Wrap 4x5 (in/yd) (Generic) 1 x Per Day/30 Days Discharge Instructions: Secure with Coban as directed. Secured With: [employer], 4.5x3.1 (in/yd) (Generic) 1 x Per Day/30 Days Discharge Instructions: Secure with Kerlix as directed. WOUND #2: - Foot Wound Laterality: Dorsal, Left Cleanser: Soap and Water 1 x Per Day/30 Days Discharge Instructions: May shower and wash wound with dial antibacterial soap and water prior to dressing change. Cleanser: Vashe 5.8 (oz) 1 x Per Day/30 Days Discharge Instructions: Cleanse the wound with Vashe prior to applying a clean dressing using gauze sponges, not tissue or cotton balls. Topical: Gentamicin 1 x Per Day/30 Days Discharge Instructions: Apply to wound when keystone not available Topical: Mupirocin Ointment 1 x Per Day/30 Days Discharge Instructions: when keystone not available apply Mupirocin (Bactroban) as instructed Topical: keystone 1 x Per Day/30  Days Prim Dressing: Maxorb Extra Ag+ Alginate Dressing, 2x2 (in/in) (Generic) 1 x Per Day/30 Days ary Discharge Instructions: Apply to wound bed as instructed Secondary Dressing: Woven Gauze Sponge, Non-Sterile 4x4 in (Generic) 1 x Per Day/30 Days Discharge Instructions: Wet with Vashe we to dry. Secondary Dressing: Zetuvit Plus 4x8 in (Generic) 1 x Per Day/30 Days Discharge Instructions: Apply over primary dressing as directed. Secured With: Coban Self-Adherent Wrap 4x5 (in/yd) (Generic) 1 x Per Day/30 Days Discharge Instructions: Secure with Coban as directed. Secured With: [employer], 4.5x3.1 (in/yd) (Generic) 1 x Per Day/30 Days Discharge Instructions: Secure with Kerlix as directed. 07/22/2023: The dorsal foot wound is nearly closed. There is just a tiny residual open area. The plantar heel wound has improved and the bone is covered. As usual, there is heaped up wet callus around the edges. The dorsal foot wound did not require any debridement. Continue topical gentamicin and mupirocin here in clinic and he will use his Keystone antibiotic compound (prescription drug) at home along with silver alginate. I debrided slough, skin, and callus from the heel wound. We will continue the same treatment here. Will follow-up  in 2 weeks. Electronic Signature(s) Signed: 07/22/2023 9:36:56 AM By: [name] MD FACS Entered By: [name] on 07/22/2023 09:36:55 -------------------------------------------------------------------------------- HxROS Details Patient Name: Date of Service: [name], [name] 07/22/2023 9:15 A M Medical Record Number: [id number] Patient Account Number: [id number] Date of Birth/Sex: Treating RN: July 19, 1956 (67 y.o. [name] Primary Care Provider: Foye [name] Other Clinician: Referring Provider: Treating Provider/Extender: Kandis Cocking in Treatment: [address] [place] ([id number])  [id number] Page 11 of 13 Eyes Medical History: Positive for: Cataracts Negative for: Glaucoma; Optic Neuritis Ear/Nose/Mouth/Throat Medical History: Negative for: Chronic sinus problems/congestion; Middle ear problems Respiratory Medical History: Positive for: Sleep Apnea Cardiovascular Medical History: Positive for: Hypertension Past Medical History Notes: hyperlipidemia Gastrointestinal Medical History: Negative for: Cirrhosis ; Colitis; Crohns; Hepatitis A; Hepatitis B; Hepatitis C Endocrine Medical History: Positive for: Type II Diabetes Time with diabetes: 20 years Treated with: Insulin Blood sugar tested every day: Yes Tested : 2-3 Genitourinary Medical History: Past Medical History Notes: AKI Immunological Medical History: Negative for: Lupus Erythematosus; Raynauds; Scleroderma Musculoskeletal Medical History: Positive for: Osteomyelitis - 2023 Neurologic Medical History: Positive for: Neuropathy - Bila lower extremities Oncologic Medical History: Negative for: Received Chemotherapy; Received Radiation Psychiatric Medical History: Negative for: Anorexia/bulimia; Confinement Anxiety HBO Extended History Items Eyes: Cataracts Immunizations Pneumococcal Vaccine: Received Pneumococcal Vaccination: Yes Received Pneumococcal Vaccination On or After [address]ZACK, HUNSAKER ([id number]) [id number] Page 12 of 13 Implantable Devices Yes Hospitalization / Surgery History Type of Hospitalization/Surgery I and D Left calcaneus back surgery- laminectomy eye surgery- Bila cataracts shoulder arthroscopy Family and Social History Cancer: Yes - Father,Mother; Diabetes: Yes - Mother,Father; Heart Disease: No; Hereditary Spherocytosis: No; Hypertension: No; Kidney Disease: No; Lung Disease: Yes - Father; Seizures: No; Stroke: No; Thyroid Problems: Yes - Mother; Tuberculosis: No; Never smoker;  Marital Status - Married; Alcohol Use: Never; Drug Use: No History; Caffeine Use: Daily; Financial Concerns: No; Food, Clothing or Shelter Needs: No; Support System Lacking: No; Transportation Concerns: No Psychologist, prison and probation services) Signed: 07/22/2023 9:37:54 AM By: [name] MD FACS Signed: 07/22/2023 12:54:25 PM By: Zenaida Deed RN, BSN Entered By: [name] on 07/22/2023 09:33:05 -------------------------------------------------------------------------------- SuperBill Details Patient Name: Date of Service: [place], [name] 07/22/2023 Medical Record Number: [id number] Patient Account Number: [id number] Date of Birth/Sex: Treating RN: [birth year] (67 y.o. 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, left ankle and foot E11.65 Type 2 diabetes mellitus with hyperglycemia E11.621 Type 2 diabetes mellitus with foot ulcer Facility Procedures : The patient participates with Medicare or their insurance follows the Medicare Facility Guidelines: CPT4 Code Description Modifier Quantity [phone] - DEBRIDE WOUND 1ST 20 SQ CM OR < 1 ICD-10 Diagnosis Description L97.424 Non-pressure chronic ulcer  of left heel and midfoot with necrosis of bone : The patient participates with Medicare or their insurance follows the Medicare Facility Guidelines: [id number] - DEBRIDE WOUND EA ADDL 20 SQ CM 1 ICD-10 Diagnosis Description 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 Physician Procedures : CPT4 Code Description Modifier [id number] - WC PHYS LEVEL 4 - EST PT 25  ICD-10 Diagnosis Description L97.424 Non-pressure chronic ulcer of left  heel and midfoot with necrosis of bone L97.523 Non-pressure chronic ulcer of other part of left foot with  necrosis of 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

## 2023-08-05 ENCOUNTER — Encounter (HOSPITAL_BASED_OUTPATIENT_CLINIC_OR_DEPARTMENT_OTHER): Payer: Medicare Other | Admitting: General Surgery

## 2023-08-10 ENCOUNTER — Encounter (HOSPITAL_BASED_OUTPATIENT_CLINIC_OR_DEPARTMENT_OTHER): Payer: Medicare Other | Admitting: General Surgery

## 2023-08-10 DIAGNOSIS — E11621 Type 2 diabetes mellitus with foot ulcer: Secondary | ICD-10-CM | POA: Diagnosis not present

## 2023-08-23 ENCOUNTER — Ambulatory Visit: Payer: Self-pay | Admitting: *Deleted

## 2023-08-23 NOTE — Patient Instructions (Signed)
Visit Information  Thank you for taking time to visit with me today. Please don't hesitate to contact me if I can be of assistance to you.   Following are the goals we discussed today:   Goals Addressed             This Visit's Progress    Planning for care needs/improve quality of life       Activities and task to complete in order to accomplish goals.   Reach out to your Therapist, [name]- to reschedule your next follow up session  Call your insurance provider for more information about your Enhanced Benefits  Plan to attend 09/22/11/24 Amputee Support Group either in-person or online   Start / continue healthy coping activities including relaxed breathing, mindfulness techniques, etc (will email some info to you)  Keep searching for job Continue with compliance of taking medication prescribed by Doctor Glad you enjoyed your first meet up/lunch with your 2 peer support men-  Make list of questions/concerns, etc to take with you to your next meet up wit them   Await call from [facility]- for hopeful help with providing assistance with light housekeeping, laundry, errands and some personal care  Consider private pay home care options if needed/desired to help with wife's care needs while you are hospitalized and rehabilitating Review insurance policy to see if you have Long-term care insurance or a custodial care benefit Complete Advance Directive packet,  Have advance directive notarized and provide a copy to provider office  Follow up on things we discussed today regarding concerns related to spouse Continue to seek part-time job- inquire about the "as needed" and consider another option if needed         Our next appointment is by telephone on 09/19/23  11am  Please call the care guide team at [phone] if you need to cancel or reschedule your appointment.   If you are experiencing a Mental Health or Behavioral Health Crisis or need someone to talk to,  please call the Suicide and Crisis Lifeline: 988 call 911   The patient verbalized understanding of instructions, educational materials, and care plan provided today and DECLINED offer to receive copy of patient instructions, educational materials, and care plan.   Telephone follow up appointment with care management team member scheduled for:09/19/23  Reece Levy, MSW, LCSW Clinical Social Worker [phone]

## 2023-08-23 NOTE — Patient Outreach (Addendum)
  Care Coordination   Follow Up Visit Note   08/23/2023 Name: [name] MRN: [id number] DOB: 1956/10/21  [name] is a 67 y.o. year old male who sees [name] Fusi, MD for primary care. I spoke with  [name] by phone today.  What matters to the patients health and wellness today?  Foot is healing more, looking for job.    Goals Addressed             This Visit's Progress    Planning for care needs/improve quality of life       Activities and task to complete in order to accomplish goals.   Reach out to your Therapist, [name]- to reschedule your next follow up session  Call your insurance provider for more information about your Enhanced Benefits  Plan to attend 09/22/11/24 Amputee Support Group either in-person or online   Start / continue healthy coping activities including relaxed breathing, mindfulness techniques, etc (will email some info to you)  Keep searching for job Continue with compliance of taking medication prescribed by Doctor Glad you enjoyed your first meet up/lunch with your 2 peer support men-  Make list of questions/concerns, etc to take with you to your next meet up wit them   Await call from [name]/[facility]- for hopeful help with providing assistance with light housekeeping, laundry, errands and some personal care  Consider private pay home care options if needed/desired to help with wife's care needs while you are hospitalized and rehabilitating Review insurance policy to see if you have Long-term care insurance or a custodial care benefit Complete Advance Directive packet,  Have advance directive notarized and provide a copy to provider office  Follow up on things we discussed today regarding concerns related to spouse Continue to seek part-time job- inquire about the "as needed" and consider another option if needed         SDOH assessments and interventions completed:  Yes     Care Coordination  Interventions:  Yes, provided  Interventions Today    Flowsheet Row Most Recent Value  General Interventions   General Interventions Discussed/Reviewed [facility]  [Pt reminded of the Support Group offering and may try to attend in October]  Mental Health Interventions   Mental Health Discussed/Reviewed --  [Enjoyed attending his 50th  HS Reunion and reconnected with some friends- plans to get together with some again soon]       Follow up plan: Follow up call scheduled for 09/19/23    Encounter Outcome:  Patient Visit Completed

## 2023-08-24 ENCOUNTER — Ambulatory Visit (HOSPITAL_BASED_OUTPATIENT_CLINIC_OR_DEPARTMENT_OTHER): Payer: Medicare Other | Admitting: General Surgery

## 2023-08-25 NOTE — Progress Notes (Signed)
DARRIOUS, MACRI R ([id number]) [id number] Page 1 of 13 Visit Report for 08/10/2023 Chief Complaint Document Details Patient Name: Date of Service: HERRICK, HAFEMAN [name] 08/10/2023 9:30 A M Medical Record Number: [id number] Patient Account Number: [id number] Date of Birth/Sex: Treating RN: 10/21/1956 (67 y.o. M) Primary Care Provider: Foye [name] Other Clinician: Referring Provider: Treating Provider/Extender: Kandis Cocking in Treatment: 56 Information Obtained from: Patient Chief Complaint Patients presents for treatment of an open diabetic ulcer and evaluation for hyperbaric oxygen therapy Electronic Signature(s) Signed: 08/10/2023 10:25:50 AM By: [name] MD FACS Entered By: [name] on 08/10/2023 10:25:49 -------------------------------------------------------------------------------- Debridement Details Patient Name: Date of Service: [name], [name] 08/10/2023 9:30 A M Medical Record Number: [id number] Patient Account Number: [id number] Date of Birth/Sex: Treating RN: 04/11/1956 (67 y.o. [name] Primary Care Provider: Foye [name] Other Clinician: Referring Provider: Treating Provider/Extender: Kandis Cocking in Treatment: 47 Debridement Performed for Assessment: Wound #1 Left Calcaneus Performed By: Physician [name], MD Debridement Type: Debridement Severity of Tissue Pre Debridement: Fat layer exposed Level of Consciousness (Pre-procedure): Awake and Alert Pre-procedure Verification/Time Out Yes - 09:58 Taken: Start Time: 09:59 Pain Control: Lidocaine 4% T opical Solution Percent of Wound Bed Debrided: 100% T Area Debrided (cm): otal 19.62 Tissue and other material debrided: Non-Viable, Slough, Skin: Epidermis, Slough Level: Skin/Epidermis Debridement Description: Selective/Open Wound Instrument: Curette Bleeding: Minimum Hemostasis Achieved:  Pressure Response to Treatment: Procedure was tolerated well Level of Consciousness (Post- Awake and Alert procedure): Post Debridement Measurements of Total Wound Length: (cm) 5 Width: (cm) 5 Depth: (cm) 1.2 Volume: (cm) 23.562 Character of Wound/Ulcer Post Debridement: Requires Further Debridement Severity of Tissue Post Debridement: Fat layer exposed Post Procedure Diagnosis GURANSH, GREELEY ([id number]) [id number] Page 2 of 13 Same as Pre-procedure Notes Scribed for Dr. Lady Gary by Tommie Ard, RN Electronic Signature(s) Signed: 08/10/2023 3:38:16 PM By: [name] MD FACS Signed: 08/25/2023 2:13:46 PM By: Tommie Ard RN Entered By: Tommie Ard on 08/10/2023 10:02:54 -------------------------------------------------------------------------------- HPI Details Patient Name: Date of Service: [name], [name] 08/10/2023 9:30 A M Medical Record Number: [id number] Patient Account Number: [id number] Date of Birth/Sex: Treating RN: [birth year] (67 y.o. M) Primary Care Provider: Foye [name] Other Clinician: Referring Provider: Treating Provider/Extender: Kandis Cocking in Treatment: 38 History of Present Illness HPI Description: ADMISSION 09/10/2022 This is a 67 year old poorly controlled type II diabetic (last hemoglobin A1c 10.8%) who has had an ulcer on his heel for over 3 years. He has been seen in multiple wound care centers, including [facility] and [facility]. He reports that at least 3 doctors have recommended that he undergo below-knee amputation. He most recently met with Dr. Reuel Boom, a vascular surgeon affiliated with [facility]. 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 Unfortunately, the patient has some extenuating social circumstances including the fact that he cares for his wife who has stage IV colon cancer and still  works, driving vehicles for [employer]. 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 He was recently hospitalized at [facility] for sepsis and currently has a PICC line through which he receives IV antibiotics. He reports having had another MRI during that hospital stay along with a chest x-ray and EKG. He apparently contacted one of the hyperbaric therapy techs here and asked a number of questions about hyperbaric oxygen treatments. He subsequently self-referred to our  center to undergo further evaluation and management. I mention to him that [facility] actually has hyperbaric chambers, but he states that he lives in [place] and this would be more convenient for him given the intensive nature of the therapy and time requirement. ABI in clinic today was 0.94. The patient actually has 2 wounds. There is a wound on the dorsum of his left foot with heavy black eschar and slough present. After debridement, this was demonstrated to involve the muscle and the extensor tendons are exposed. On his heel, there is essentially a "shark bite" type wound, with much of the heel fat pad absent. The muscle layer is exposed. There is blue-green staining around the perimeter of the wound, but no significant odor. He says he has been applying collagen to the wound on his heel and Silvadene and Betadine to the wound on his dorsal foot. 09/20/2022: The heel wound is quite macerated with wet periwound callus. There is slough accumulation on the surface. The dorsal foot wound looks better this week. There is still exposed tendon, but it is fairly clean with just a little biofilm buildup. We are still working on gathering the required documentation to submit for pretreatment review for hyperbaric oxygen therapy. 09/29/2022: The dorsal foot wound continues to improve. I do not see any exposed tendon at this point. There is just some slough accumulation on the wound surface. He continues to  have very wet macerated periwound callus on his heel. There is slough on the surface, but it is loose and thin. There is an area of undermining at the 11 o'clock position, but the overlying skin and subcutaneous tissue is healthy and viable. He has been approved for hyperbaric oxygen therapy and will start treatment tomorrow. 10/06/2022: Continued contraction and improvement of the dorsal foot wound. There is just a little bit of slough accumulation on the wound surface. The periwound callus continues to accumulate on the heel wound and it is quite macerated. It is also persistently found with blue-green staining present. He did initiate his hyperbaric oxygen therapy, but has had significant difficulty with decompression. He will be going to ENT to have PE tubes placed. 10/18/2022: His hyperbaric oxygen therapy is on hold while his otological issues are being addressed. He came in again today with the periwound skin on both the dorsal aspect of his foot and his calcaneus completely macerated. The blue-green discoloration, however, has abated and the undermining on the calcaneus has improved. The dorsal foot wound has also contracted somewhat. 10/26/2022: The dorsal foot wound continues to contract and fill with good granulation tissue. The heel, once again, has a rim of macerated callus, but the undermining and tunneling continues to contract. He has completed his oral antibiotics and has been using the [facility] topical compounded antibiotic for his dressing changes at home. He is scheduled to see ENT this afternoon. 11/08/2022: The dorsal foot wound is flush with the surrounding skin and has a good granulation tissue surface. There is some slough accumulation. As usual, the heel has a rim of macerated callus but the dimensions are smaller and the tunneling and undermining have contracted further. He has resumed his hyperbaric oxygen therapy. 12/12; patient seen for wound evaluation. He is tolerating  HBO although we could not dive him yesterday because of relative hypoglycemia and the fact he had given himself NovoLog insulin before he came to clinic. Today his blood sugar is in the 180 range she should be fine. He has a large wound on the  plantar calcaneus on the right and a more superficial area on the dorsal foot. He is using a scooter for offloading 11/30/2022: The dorsal foot wound continues to contract. There is some slough on the wound surface. The large calcaneus wound has heaped up wet callus around the margins. Continued undermining. 12/08/2022: The dorsal foot wound is flat and flush with the surrounding skin surface. There is some slough present. Once again, the calcaneal wound has thick, absolutely macerated callus hanging off in tatters around the edges. The patient cannot explain to me why this part of his wound gets so wet. There is slough on the surface. The undermined portion of the wound has filled in. ANDI, MACBRIDE R ([id number]) [id number] Page 3 of 13 12/17/2022: Earlier this week, he presented for his hyperbaric oxygen therapy and was hypotensive and ill-appearing. He also had what appeared to be an infected sebaceous cyst on his right upper arm. He was sent to the emergency department. He was given a fluid bolus and the ED provider lanced the cyst. He was prescribed doxycycline. He seems to be feeling better today. He notes that he has had copious drainage from his foot. On further questioning, he states that he has not been taking his oral diuretic. The dorsal foot wound has expanded somewhat, but is actually more superficial. The plantar heel wound is about the same. 12/27/2022: The dorsal foot wound has epithelialized further. The plantar foot wound is about the same size but has heaped up macerated callus around the perimeter. The intake nurse noted a tiny fragment of bone on his dressings and there is now an area at about the 5 o'clock position  where 1 can palpate bone through a small slit in the soft tissues. The wound on his arm is contracting but he still likely has cyst wall present due to the manner in which the infected sebaceous cyst was addressed in the ER. 01/01/2023: The wound on his shoulder has contracted considerably. The dorsal foot wound has some slough on the surface but also looks to be improving. The plantar foot wound is basically unchanged. 01/10/2023: The wound on his shoulder continues to diminish in size and depth. The dorsal foot wound is improving with just a light layer of slough on the surface. The plantar foot wound appears to be contracting but still has thick wet callus around the perimeter. We did in chamber T cPO2 monitoring during his last hyperbaric oxygen therapy. The results show that he has extremely poor tissue perfusion at room air and 1 atm of pressure. He does respond extremely well to hyperbaric oxygen therapy, however. 01/17/2023: The wound on his shoulder is down to 0.6 cm in depth, down from 1 cm last week. The dorsal foot wound is quite a bit smaller this week with just some light slough on the surface. The plantar foot wound is also smaller and the area of bone that was exposed at the posterior heel is now covered. There is still substantial wet callus around the perimeter. He is scheduled to undergo formal vascular studies sometime next week. 01/24/2023: The wound on his shoulder has almost completely filled in, with just a little bit of depth remaining. The dorsal foot wound got macerated; it appears that his home health nurses are applying the drape for his heel wound over the top of this, meaning that the dorsal foot wound is not getting changed as regularly as it is supposed to. There is some slough on the surface. The plantar foot wound  has thick wet callus around the perimeter. The dimensions measure a little bit smaller. He is having his vascular studies done this afternoon. 02/07/2023: The  wound on his shoulder is healed. The dorsal foot wound continues to be subjected to excessive moisture due to the home health nurses applying the drape from his wound VAC over the top of it. It does measure smaller, however. There is more bone exposed on his calcaneus, unfortunately. He has heaped up wet callus around the edges of the heel wound again. He is going to undergo angiography with Dr. Lenell Antu on Friday. Hopefully there will be intervention available to him that may aid in his wound healing. 02/14/2023: He did not get his angiogram last week with Dr. Lenell Antu because he did not have a ride home. The dorsal foot wound looks a little bit better with just a light layer of slough. Unfortunately, there is more bone exposed at the calcaneus and I am concerned that we are nearing a point at which his leg will not be salvageable. 3/18; patient presents for follow-up. He was recently hospitalized on 3/11 for sepsis secondary to diabetic foot wound. He was dishcharged with Augmentin and doxycycline and plan is for 6 weeks of this. He currently has a wound VAC. Wound has declined in appearance. Amputation was recommended in the hospital however patient is not ready for this. Patient states he would like to stop HBO. He currently denies systemic signs of infection. 03/07/2023: The calcaneal wound continues to deteriorate. There is significant undermining around the posterior aspect of the calcaneus and bone is palpable all the way around. The dorsal foot wound has some leathery eschar and slough present. 03/14/2023: The dorsal foot wound is somewhat leathery with some dark eschar and slough present. The calcaneal wound is deteriorating further. There are fragments of the bone hanging loose. He does have an appointment with Dr. Lenell Antu next week on Tuesday to discuss amputation. 04/04/2023: The heel ulcer is larger, but shallower. There is heaped up with callus around the edges. The dorsal foot wound is stable. He  has appointment with Dr. Lenell Antu was rescheduled for next Tuesday. He is working on arrangements for his dog and his wife to facilitate below-knee amputation. 04/12/2023: The heel ulcer looks about the same. Bone is not exposed. There is moist callus heaped up around the edges. The dorsal foot wound is stable. He will be seeing Dr. Lenell Antu this afternoon to discuss amputation. 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 The dorsal foot wound is a little bit smaller and shallower. It is not as irritated as it often looks. There is slough on the surface. He met with Dr. Lenell Antu and is still not ready to pursue amputation at this time. 05/16/2023: The dorsal foot wound has gotten larger and there appears to be a lot of drainage resulting in periwound tissue maceration. The calcaneal wound also looks similar, with heaped up macerated callus around the edges and a layer of slough on the surface. 05/27/2023: No real change to his wounds. He is still avoiding amputation. 06/10/2023: The dorsal foot wound actually looks a little bit better this week. There is light slough on the surface. On the plantar heel wound, he has less wet callus accumulation. There is rubbery slough on the surface. 06/24/2023: The dorsal foot wound has epithelialized substantially and there are just 2 remaining open areas, both with hypertrophic granulation  tissue. The plantar heel wound has deteriorated and there are areas that now have bone easily identified. There is wet callus around the edges, as usual. 07/22/2023: The dorsal foot wound is nearly closed. There is just a tiny residual open area. The plantar heel wound has improved and the bone is covered. As usual, there is heaped up wet callus around the edges. 08/10/2023: The dorsal foot wound has healed. The plantar heel wound has actually improved with better granulation tissue and actually  measures smaller. He has the usual accumulation of wet callus around the edges. Electronic Signature(s) Signed: 08/10/2023 10:26:33 AM By: [name] MD FACS Entered By: [name] on 08/10/2023 10:26:33 Physical Exam Details -------------------------------------------------------------------------------- [name] ([id number]) [id number] Page 4 of 13 Patient Name: Date of Service: [place], [name] 08/10/2023 9:30 A M Medical Record Number: [id number] Patient Account Number: [id number] Date of Birth/Sex: Treating RN: 11-Jul-1956 (67 y.o. M) Primary Care Provider: Foye [name] Other Clinician: Referring Provider: Treating Provider/Extender: Kandis Cocking in Treatment: 36 Constitutional Hypertensive, asymptomatic. . . . no acute distress. Respiratory Normal work of breathing on room air. Notes 08/10/2023: The dorsal foot wound has healed. The plantar heel wound has actually improved with better granulation tissue and actually measures smaller. He has the usual accumulation of wet callus around the edges. Electronic Signature(s) Signed: 08/10/2023 10:27:00 AM By: [name] MD FACS Entered By: [name] on 08/10/2023 10:27:00 -------------------------------------------------------------------------------- Physician Orders Details Patient Name: Date of Service: [place], [name] 08/10/2023 9:30 A M Medical Record Number: [id number] Patient Account Number: [id number] Date of Birth/Sex: Treating RN: [birth year] (67 y.o. 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 - Dr. Lady Gary Room 1 Return A Anesthetic Wound #1 Left Calcaneus (In clinic) Topical Lidocaine 4% applied to wound bed Wound #2 Left,Dorsal Foot (In clinic) Topical Lidocaine 4% applied to wound bed Bathing/ Shower/ Hygiene May shower and wash wound with soap and water. - use antibacterial soap Edema Control - Lymphedema / SCD / Other Left Lower Extremity Elevate legs to the level of the heart or above for 30 minutes daily and/or when sitting for 3-4 times a day throughout the day. Avoid standing for long periods of time. Moisturize legs daily. Compression stocking or Garment 30-40 mm/Hg pressure to: - left leg daily Off-Loading Wound #1 Left Calcaneus Other: - use knee scooter to ambulate, minimal weight bearing left foot Wound Treatment KENNER, AGRO ([id number]) [id number] Page 5 of 13 Wound #1 - Calcaneus Wound Laterality: Left Cleanser: Soap and Water 1 x Per Day/30 Days Discharge Instructions: May shower and wash wound with dial antibacterial soap and water prior to dressing change. Cleanser: Vashe 5.8 (oz) 1 x Per Day/30 Days Discharge Instructions: Cleanse the wound with Vashe prior to applying a clean dressing using gauze sponges, not tissue or cotton balls. Peri-Wound Care: Zinc Oxide Ointment 30g tube 1 x Per Day/30 Days Discharge Instructions: Apply Zinc Oxide to periwound with each dressing change Topical: Gentamicin 1 x Per Day/30 Days Discharge Instructions: Apply to wound when keystone not available Topical: Mupirocin Ointment 1  x Per Day/30 Days Discharge Instructions: when keystone not available apply Mupirocin (Bactroban) as instructed Topical: keystone antibiotic compound 1 x Per Day/30 Days Prim Dressing: Maxorb Extra Ag+ Alginate Dressing, 4x4.75 (in/in) (Generic) 1 x Per Day/30  Days ary Discharge Instructions: Apply to wound bed as instructed Secondary Dressing: ABD Pad, 8x10 (Generic) 1 x Per Day/30 Days Discharge Instructions: Apply over primary dressing as directed. Secondary Dressing: Woven Gauze Sponge, Non-Sterile 4x4 in (Generic) 1 x Per Day/30 Days Discharge Instructions: Wet with Vashe we to dry. Secondary Dressing: Zetuvit Absorbent Pad, 4x4 (in/in) (Dispense As Written) 1 x Per Day/30 Days Discharge Instructions: apply over primary dressing Secured With: Coban Self-Adherent Wrap 4x5 (in/yd) (Generic) 1 x Per Day/30 Days Discharge Instructions: Secure with Coban as directed. Secured With: [employer], 4.5x3.1 (in/yd) (Generic) 1 x Per Day/30 Days Discharge Instructions: Secure with Kerlix as directed. Wound #2 - Foot Wound Laterality: Dorsal, Left Cleanser: Soap and Water 1 x Per Day/30 Days Discharge Instructions: May shower and wash wound with dial antibacterial soap and water prior to dressing change. Cleanser: Vashe 5.8 (oz) 1 x Per Day/30 Days Discharge Instructions: Cleanse the wound with Vashe prior to applying a clean dressing using gauze sponges, not tissue or cotton balls. Topical: Gentamicin 1 x Per Day/30 Days Discharge Instructions: Apply to wound when keystone not available Topical: Mupirocin Ointment 1 x Per Day/30 Days Discharge Instructions: when keystone not available apply Mupirocin (Bactroban) as instructed Topical: keystone 1 x Per Day/30 Days Prim Dressing: Maxorb Extra Ag+ Alginate Dressing, 2x2 (in/in) (Generic) 1 x Per Day/30 Days ary Discharge Instructions: Apply to wound bed as instructed Secondary Dressing: Woven Gauze Sponge, Non-Sterile 4x4 in (Generic) 1 x Per Day/30 Days Discharge Instructions: Wet with Vashe we to dry. Secondary Dressing: Zetuvit Plus 4x8 in (Generic) 1 x Per Day/30 Days Discharge Instructions: Apply over primary dressing as directed. Secured With: Coban Self-Adherent Wrap 4x5 (in/yd)  (Generic) 1 x Per Day/30 Days Discharge Instructions: Secure with Coban as directed. Secured With: [employer], 4.5x3.1 (in/yd) (Generic) 1 x Per Day/30 Days Discharge Instructions: Secure with Kerlix as directed. Electronic Signature(s) Signed: 08/10/2023 3:38:16 PM By: [name] MD FACS Entered By: [name] on 08/10/2023 10:27:46 [name] ([id number]) [id number] Page 6 of 13 -------------------------------------------------------------------------------- Problem List Details Patient Name: Date of Service: [place], [name] 08/10/2023 9:30 A M Medical Record Number: [id number] Patient Account Number: [id number] Date of Birth/Sex: Treating RN: 10/20/1956 (67 y.o. 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 09/10/2022 No Yes F09.323 Other chronic osteomyelitis, left ankle and foot 09/10/2022 No Yes E11.65 Type 2 diabetes mellitus with hyperglycemia 09/10/2022 No Yes E11.621 Type 2 diabetes mellitus with foot ulcer 09/10/2022 No Yes Inactive Problems ICD-10 Code Description Active Date Inactive Date L72.3 Sebaceous cyst 12/17/2022 12/17/2022 Resolved Problems ICD-10 Code Description Active Date Resolved Date L98.492 Non-pressure chronic ulcer of skin of other sites with fat layer exposed 12/17/2022 01/03/2023 L97.523 Non-pressure chronic ulcer of other part of left foot with necrosis of muscle 09/10/2022 09/10/2022 Electronic Signature(s) Signed: 08/10/2023 10:25:20 AM By: [name] MD FACS Entered By: [name] on 08/10/2023 10:25:20 -------------------------------------------------------------------------------- Progress Note Details Patient  Name: Date of Service: [name], [name] 08/10/2023 9:30 A M Medical Record Number: [id number] Patient Account  Number: [id number] Date of Birth/Sex: Treating RN: [birth year] (67 y.o. M) Primary Care Provider: Foye [name] Other Clinician: TALLEN, COLAVITO ([id number]) [id number] Page 7 of 13 Referring Provider: Treating Provider/Extender: Kandis Cocking in Treatment: 47 Subjective Chief Complaint Information obtained from Patient Patients presents for treatment of an open diabetic ulcer and evaluation for hyperbaric oxygen therapy History of Present Illness (HPI) ADMISSION 09/10/2022 This is a 68 year old poorly controlled type II diabetic (last hemoglobin A1c 10.8%) who has had an ulcer on his heel for over 3 years. He has been seen in multiple wound care centers, including [facility] and [facility] [facility]. He reports that at least 3 doctors have recommended that he undergo below-knee amputation. He most recently met with Dr. Reuel Boom, a vascular surgeon affiliated with [facility]. 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 Unfortunately, the patient has some extenuating social circumstances including the fact that he cares for his wife who has stage IV colon cancer and still works, driving vehicles for [employer]. 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 He was recently hospitalized at [facility] for sepsis and currently has a PICC line through which he receives IV antibiotics. He reports having had another MRI during that hospital stay along with a chest x-ray and EKG. He apparently contacted one of the hyperbaric therapy techs here and asked a number of questions about hyperbaric oxygen treatments. He subsequently self-referred to our center to undergo further evaluation and management. I mention to him that [facility] actually has hyperbaric chambers, but he states that he lives in [place] and this would be more convenient for him given the intensive nature of the therapy and time requirement. ABI in clinic today was 0.94. The patient actually has 2 wounds. There is a wound on the dorsum of his left foot with heavy black eschar and slough present. After debridement, this was demonstrated to involve the muscle and the extensor tendons are exposed. On his heel, there is essentially a "shark bite" type wound, with much of the heel fat pad absent. The muscle layer is exposed. There is blue-green staining around the perimeter of the wound, but no significant odor. He says he has been applying collagen to the wound on his heel and Silvadene and Betadine to the wound on his dorsal foot. 09/20/2022: The heel wound is quite macerated with wet periwound callus. There is slough accumulation on the surface. The dorsal foot wound looks better this week. There is still exposed tendon, but it is fairly clean with just a little biofilm buildup. We are still working on gathering the required documentation to submit for pretreatment review for hyperbaric oxygen therapy. 09/29/2022: The dorsal foot wound continues to improve. I do not see any exposed tendon at this point. There is just some slough accumulation on the wound surface. He continues to have very wet macerated periwound callus on his heel. There is slough on the surface, but it is loose and thin. There is an area of undermining at the 11 o'clock position, but the overlying skin and subcutaneous tissue is healthy and viable. He has been approved for hyperbaric oxygen therapy and will start treatment tomorrow. 10/06/2022: Continued contraction and improvement of the dorsal foot wound. There is just a little bit of slough accumulation on the wound surface. The periwound callus continues to accumulate on the heel wound and it is quite macerated. It is  also persistently  found with blue-green staining present. He did initiate his hyperbaric oxygen therapy, but has had significant difficulty with decompression. He will be going to ENT to have PE tubes placed. 10/18/2022: His hyperbaric oxygen therapy is on hold while his otological issues are being addressed. He came in again today with the periwound skin on both the dorsal aspect of his foot and his calcaneus completely macerated. The blue-green discoloration, however, has abated and the undermining on the calcaneus has improved. The dorsal foot wound has also contracted somewhat. 10/26/2022: The dorsal foot wound continues to contract and fill with good granulation tissue. The heel, once again, has a rim of macerated callus, but the undermining and tunneling continues to contract. He has completed his oral antibiotics and has been using the [facility] topical compounded antibiotic for his dressing changes at home. He is scheduled to see ENT this afternoon. 11/08/2022: The dorsal foot wound is flush with the surrounding skin and has a good granulation tissue surface. There is some slough accumulation. As usual, the heel has a rim of macerated callus but the dimensions are smaller and the tunneling and undermining have contracted further. He has resumed his hyperbaric oxygen therapy. 12/12; patient seen for wound evaluation. He is tolerating HBO although we could not dive him yesterday because of relative hypoglycemia and the fact he had given himself NovoLog insulin before he came to clinic. Today his blood sugar is in the 180 range she should be fine. He has a large wound on the plantar calcaneus on the right and a more superficial area on the dorsal foot. He is using a scooter for offloading 11/30/2022: The dorsal foot wound continues to contract. There is some slough on the wound surface. The large calcaneus wound has heaped up wet callus around the margins. Continued undermining. 12/08/2022: The dorsal foot wound is  flat and flush with the surrounding skin surface. There is some slough present. Once again, the calcaneal wound has thick, absolutely macerated callus hanging off in tatters around the edges. The patient cannot explain to me why this part of his wound gets so wet. There is slough on the surface. The undermined portion of the wound has filled in. 12/17/2022: Earlier this week, he presented for his hyperbaric oxygen therapy and was hypotensive and ill-appearing. He also had what appeared to be an infected sebaceous cyst on his right upper arm. He was sent to the emergency department. He was given a fluid bolus and the ED provider lanced the cyst. He was prescribed doxycycline. He seems to be feeling better today. He notes that he has had copious drainage from his foot. On further questioning, he states that he has not been taking his oral diuretic. The dorsal foot wound has expanded somewhat, but is actually more superficial. The plantar heel wound is about the same. 12/27/2022: The dorsal foot wound has epithelialized further. The plantar foot wound is about the same size but has heaped up macerated callus around the perimeter. The intake nurse noted a tiny fragment of bone on his dressings and there is now an area at about the 5 o'clock position where 1 can palpate bone through a small slit in the soft tissues. The wound on his arm is contracting but he still likely has cyst wall present due to the manner in which the infected sebaceous cyst was addressed in the ER. 01/01/2023: The wound on his shoulder has contracted considerably. The dorsal foot wound has some slough on the surface but  also looks to be improving. The plantar foot wound is basically unchanged. 01/10/2023: The wound on his shoulder continues to diminish in size and depth. The dorsal foot wound is improving with just a light layer of slough on the surface. The plantar foot wound appears to be contracting but still has thick wet callus  around the perimeter. We did in chamber T cPO2 monitoring during his last hyperbaric oxygen therapy. The results show that he has extremely poor tissue perfusion at room air and 1 atm of pressure. He does respond extremely well to hyperbaric oxygen therapy, however. 01/17/2023: The wound on his shoulder is down to 0.6 cm in depth, down from 1 cm last week. The dorsal foot wound is quite a bit smaller this week with just some light slough on the surface. The plantar foot wound is also smaller and the area of bone that was exposed at the posterior heel is now covered. There is still substantial wet callus around the perimeter. He is scheduled to undergo formal vascular studies sometime next week. 01/24/2023: The wound on his shoulder has almost completely filled in, with just a little bit of depth remaining. The dorsal foot wound got macerated; it appears JOEVON, CATALA ([id number]) [id number] Page 8 of 13 that his home health nurses are applying the drape for his heel wound over the top of this, meaning that the dorsal foot wound is not getting changed as regularly as it is supposed to. There is some slough on the surface. The plantar foot wound has thick wet callus around the perimeter. The dimensions measure a little bit smaller. He is having his vascular studies done this afternoon. 02/07/2023: The wound on his shoulder is healed. The dorsal foot wound continues to be subjected to excessive moisture due to the home health nurses applying the drape from his wound VAC over the top of it. It does measure smaller, however. There is more bone exposed on his calcaneus, unfortunately. He has heaped up wet callus around the edges of the heel wound again. He is going to undergo angiography with Dr. Lenell Antu on Friday. Hopefully there will be intervention available to him that may aid in his wound healing. 02/14/2023: He did not get his angiogram last week with Dr. Lenell Antu because he did  not have a ride home. The dorsal foot wound looks a little bit better with just a light layer of slough. Unfortunately, there is more bone exposed at the calcaneus and I am concerned that we are nearing a point at which his leg will not be salvageable. 3/18; patient presents for follow-up. He was recently hospitalized on 3/11 for sepsis secondary to diabetic foot wound. He was dishcharged with Augmentin and doxycycline and plan is for 6 weeks of this. He currently has a wound VAC. Wound has declined in appearance. Amputation was recommended in the hospital however patient is not ready for this. Patient states he would like to stop HBO. He currently denies systemic signs of infection. 03/07/2023: The calcaneal wound continues to deteriorate. There is significant undermining around the posterior aspect of the calcaneus and bone is palpable all the way around. The dorsal foot wound has some leathery eschar and slough present. 03/14/2023: The dorsal foot wound is somewhat leathery with some dark eschar and slough present. The calcaneal wound is deteriorating further. There are fragments of the bone hanging loose. He does have an appointment with Dr. Lenell Antu next week on Tuesday to discuss amputation. 04/04/2023: The heel ulcer is larger, but  shallower. There is heaped up with callus around the edges. The dorsal foot wound is stable. He has appointment with Dr. Lenell Antu was rescheduled for next Tuesday. He is working on arrangements for his dog and his wife to facilitate below-knee amputation. 04/12/2023: The heel ulcer looks about the same. Bone is not exposed. There is moist callus heaped up around the edges. The dorsal foot wound is stable. He will be seeing Dr. Lenell Antu this afternoon to discuss amputation. 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 The dorsal foot wound is a little bit smaller and shallower. It is  not as irritated as it often looks. There is slough on the surface. He met with Dr. Lenell Antu and is still not ready to pursue amputation at this time. 05/16/2023: The dorsal foot wound has gotten larger and there appears to be a lot of drainage resulting in periwound tissue maceration. The calcaneal wound also looks similar, with heaped up macerated callus around the edges and a layer of slough on the surface. 05/27/2023: No real change to his wounds. He is still avoiding amputation. 06/10/2023: The dorsal foot wound actually looks a little bit better this week. There is light slough on the surface. On the plantar heel wound, he has less wet callus accumulation. There is rubbery slough on the surface. 06/24/2023: The dorsal foot wound has epithelialized substantially and there are just 2 remaining open areas, both with hypertrophic granulation tissue. The plantar heel wound has deteriorated and there are areas that now have bone easily identified. There is wet callus around the edges, as usual. 07/22/2023: The dorsal foot wound is nearly closed. There is just a tiny residual open area. The plantar heel wound has improved and the bone is covered. As usual, there is heaped up wet callus around the edges. 08/10/2023: The dorsal foot wound has healed. The plantar heel wound has actually improved with better granulation tissue and actually measures smaller. He has the usual accumulation of wet callus around the edges. Patient History Family History Cancer - Father,Mother, Diabetes - Mother,Father, Lung Disease - Father, Thyroid Problems - Mother, No family history of Heart Disease, Hereditary Spherocytosis, Hypertension, Kidney Disease, Seizures, Stroke, Tuberculosis. Social History Never smoker, Marital Status - Married, Alcohol Use - Never, Drug Use - No History, Caffeine Use - Daily. Medical History Eyes Patient has history of Cataracts Denies history of Glaucoma, Optic  Neuritis Ear/Nose/Mouth/Throat Denies history of Chronic sinus problems/congestion, Middle ear problems Respiratory Patient has history of Sleep Apnea Cardiovascular Patient has history of Hypertension Gastrointestinal Denies history of Cirrhosis , Colitis, Crohns, Hepatitis A, Hepatitis B, Hepatitis C Endocrine Patient has history of Type II Diabetes Immunological Denies history of Lupus Erythematosus, Raynauds, Scleroderma Musculoskeletal Patient has history of Osteomyelitis - 2023 Neurologic Patient has history of Neuropathy - Bila lower extremities Oncologic Denies history of Received Chemotherapy, Received Radiation Psychiatric Denies history of Anorexia/bulimia, Confinement Anxiety Hospitalization/Surgery History - I and D Left calcaneus. - back surgery- laminectomy. - eye surgery- Bila cataracts. - shoulder arthroscopy. Medical A Surgical History Notes nd Cardiovascular hyperlipidemia Genitourinary AKI GRACIE, RELAFORD ([id number]) [phone].pdf Page 9 of 13 Objective Constitutional Hypertensive, asymptomatic. no acute distress. Vitals Time Taken: 9:29 AM, Height: 74 in, Weight: 245 lbs, BMI: 31.5, Temperature: 98.7 F, Pulse: 86 bpm, Respiratory Rate: 18 breaths/min, Blood Pressure: 154/77 mmHg. Respiratory Normal work of breathing on room air. General Notes: 08/10/2023: The dorsal foot  wound has healed. The plantar heel wound has actually improved with better granulation tissue and actually measures smaller. He has the usual accumulation of wet callus around the edges. Integumentary (Hair, Skin) Wound #1 status is Open. Original cause of wound was Pressure Injury. The date acquired was: 03/09/2019. The wound has been in treatment 47 weeks. The wound is located on the Left Calcaneus. The wound measures 5cm length x 5cm width x 1.2cm depth; 19.635cm^2 area and 23.562cm^3 volume. There is Fat Layer (Subcutaneous Tissue) exposed. There is a large  amount of purulent drainage noted. The wound margin is thickened. There is medium (34-66%) pink, pale, hyper - granulation within the wound bed. There is a medium (34-66%) amount of necrotic tissue within the wound bed including Adherent Slough. The periwound skin appearance had no abnormalities noted for color. The periwound skin appearance exhibited: Callus, Maceration. The periwound skin appearance did not exhibit: Dry/Scaly. Periwound temperature was noted as No Abnormality. Wound #2 status is Open. Original cause of wound was Pressure Injury. The date acquired was: 07/09/2022. The wound has been in treatment 47 weeks. The wound is located on the Left,Dorsal Foot. The wound measures 0.1cm length x 0.1cm width x 0.1cm depth; 0.008cm^2 area and 0.001cm^3 volume. There is Fat Layer (Subcutaneous Tissue) exposed. There is a small amount of serous drainage noted. The wound margin is flat and intact. There is no granulation within the wound bed. There is no necrotic tissue within the wound bed. The periwound skin appearance had no abnormalities noted for color. The periwound skin appearance exhibited: Dry/Scaly. The periwound skin appearance did not exhibit: Excoriation, Scarring, Maceration. Periwound temperature was noted as No Abnormality. 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 There was a Selective/Open Wound Skin/Epidermis Debridement with a total area of 19.62 sq cm performed by [name], MD. With the following instrument(s): Curette to remove Non-Viable tissue/material. Material removed  includes [facility] and Skin: Epidermis and after achieving pain control using Lidocaine 4% T opical Solution. No specimens were taken. A time out was conducted at 09:58, prior to the start of the procedure. A Minimum amount of bleeding was controlled with Pressure. The procedure was tolerated well. Post Debridement Measurements: 5cm length x 5cm width x 1.2cm depth; 23.562cm^3 volume. Character of Wound/Ulcer Post Debridement requires further debridement. Severity of Tissue Post Debridement is: Fat layer exposed. Post procedure Diagnosis Wound #1: Same as Pre-Procedure General Notes: Scribed for Dr. Lady Gary by Tommie Ard, RN. Plan Follow-up Appointments: Return Appointment in 2 weeks. - Dr. Lady Gary Room 1 Anesthetic: Wound #1 Left Calcaneus: (In clinic) Topical Lidocaine 4% applied to wound bed Wound #2 Left,Dorsal Foot: (In clinic) Topical Lidocaine 4% applied to wound bed Bathing/ Shower/ Hygiene: May shower and wash wound with soap and water. - use antibacterial soap FARRELL, STITELER R ([id number]) [id number] Page 10 of 13 Edema Control - Lymphedema / SCD / Other: Elevate legs to the level of the heart or above for 30 minutes daily and/or when sitting for 3-4 times a day throughout the day. Avoid standing for long periods of time. Moisturize legs daily. Compression stocking or Garment 30-40 mm/Hg pressure to: - left leg daily  Off-Loading: Wound #1 Left Calcaneus: Other: - use knee scooter to ambulate, minimal weight bearing left foot WOUND #1: - Calcaneus Wound Laterality: Left Cleanser: Soap and Water 1 x Per Day/30 Days Discharge Instructions: May shower and wash wound with dial antibacterial soap and water prior to dressing change. Cleanser: Vashe 5.8 (oz) 1 x Per Day/30 Days Discharge Instructions: Cleanse the wound with Vashe prior to applying a clean dressing using gauze sponges, not tissue or cotton balls. Peri-Wound Care: Zinc Oxide Ointment 30g tube 1  x Per Day/30 Days Discharge Instructions: Apply Zinc Oxide to periwound with each dressing change Topical: Gentamicin 1 x Per Day/30 Days Discharge Instructions: Apply to wound when keystone not available Topical: Mupirocin Ointment 1 x Per Day/30 Days Discharge Instructions: when keystone not available apply Mupirocin (Bactroban) as instructed Topical: keystone antibiotic compound 1 x Per Day/30 Days Prim Dressing: Maxorb Extra Ag+ Alginate Dressing, 4x4.75 (in/in) (Generic) 1 x Per Day/30 Days ary Discharge Instructions: Apply to wound bed as instructed Secondary Dressing: ABD Pad, 8x10 (Generic) 1 x Per Day/30 Days Discharge Instructions: Apply over primary dressing as directed. Secondary Dressing: Woven Gauze Sponge, Non-Sterile 4x4 in (Generic) 1 x Per Day/30 Days Discharge Instructions: Wet with Vashe we to dry. Secondary Dressing: Zetuvit Absorbent Pad, 4x4 (in/in) (Dispense As Written) 1 x Per Day/30 Days Discharge Instructions: apply over primary dressing Secured With: Coban Self-Adherent Wrap 4x5 (in/yd) (Generic) 1 x Per Day/30 Days Discharge Instructions: Secure with Coban as directed. Secured With: [employer], 4.5x3.1 (in/yd) (Generic) 1 x Per Day/30 Days Discharge Instructions: Secure with Kerlix as directed. WOUND #2: - Foot Wound Laterality: Dorsal, Left Cleanser: Soap and Water 1 x Per Day/30 Days Discharge Instructions: May shower and wash wound with dial antibacterial soap and water prior to dressing change. Cleanser: Vashe 5.8 (oz) 1 x Per Day/30 Days Discharge Instructions: Cleanse the wound with Vashe prior to applying a clean dressing using gauze sponges, not tissue or cotton balls. Topical: Gentamicin 1 x Per Day/30 Days Discharge Instructions: Apply to wound when keystone not available Topical: Mupirocin Ointment 1 x Per Day/30 Days Discharge Instructions: when keystone not available apply Mupirocin (Bactroban) as instructed Topical: keystone 1 x Per  Day/30 Days Prim Dressing: Maxorb Extra Ag+ Alginate Dressing, 2x2 (in/in) (Generic) 1 x Per Day/30 Days ary Discharge Instructions: Apply to wound bed as instructed Secondary Dressing: Woven Gauze Sponge, Non-Sterile 4x4 in (Generic) 1 x Per Day/30 Days Discharge Instructions: Wet with Vashe we to dry. Secondary Dressing: Zetuvit Plus 4x8 in (Generic) 1 x Per Day/30 Days Discharge Instructions: Apply over primary dressing as directed. Secured With: Coban Self-Adherent Wrap 4x5 (in/yd) (Generic) 1 x Per Day/30 Days Discharge Instructions: Secure with Coban as directed. Secured With: [employer], 4.5x3.1 (in/yd) (Generic) 1 x Per Day/30 Days Discharge Instructions: Secure with Kerlix as directed. 08/10/2023: The dorsal foot wound has healed. The plantar heel wound has actually improved with better granulation tissue and actually measures smaller. He has the usual accumulation of wet callus around the edges. I used a curette to debride slough from the wound surface and to debride the callus and wet skin from around the wound edges. He will continue to use his Keystone topical antibiotic compound (prescription drug) with silver alginate, Kerlix wrap. He has been drinking Juven twice a day and I encouraged him to continue this. He should continue to use his knee scooter is much as possible to offload. Follow-up in 2 weeks. Electronic Signature(s) Signed: 08/10/2023 10:29:04 AM  By: [name] MD FACS Entered By: [name] on 08/10/2023 10:29:03 -------------------------------------------------------------------------------- HxROS Details Patient Name: Date of Service: Gadea, [name] 08/10/2023 9:30 A M Medical Record Number: [id number] Patient Account Number: [id number] Date of Birth/Sex: Treating RN: [birth year] (67 y.o. M) Primary Care Provider: Foye [name] Other Clinician: Referring Provider: Treating Provider/Extender: Kandis Cocking in  Treatment: [address], [place] R ([id number]) [id number] Page 11 of 13 Eyes Medical History: Positive for: Cataracts Negative for: Glaucoma; Optic Neuritis Ear/Nose/Mouth/Throat Medical History: Negative for: Chronic sinus problems/congestion; Middle ear problems Respiratory Medical History: Positive for: Sleep Apnea Cardiovascular Medical History: Positive for: Hypertension Past Medical History Notes: hyperlipidemia Gastrointestinal Medical History: Negative for: Cirrhosis ; Colitis; Crohns; Hepatitis A; Hepatitis B; Hepatitis C Endocrine Medical History: Positive for: Type II Diabetes Time with diabetes: 20 years Treated with: Insulin Blood sugar tested every day: Yes Tested : 2-3 Genitourinary Medical History: Past Medical History Notes: AKI Immunological Medical History: Negative for: Lupus Erythematosus; Raynauds; Scleroderma Musculoskeletal Medical History: Positive for: Osteomyelitis - 2023 Neurologic Medical History: Positive for: Neuropathy - Bila lower extremities Oncologic Medical History: Negative for: Received Chemotherapy; Received Radiation Psychiatric Medical History: Negative for: Anorexia/bulimia; Confinement Anxiety HBO Extended History Items Eyes: Cataracts Immunizations Pneumococcal Vaccine: Received Pneumococcal Vaccination: Yes Received Pneumococcal Vaccination On or After 60th Birthday: Yes Implantable Devices [place] R ([id number]) [id number] Page 12 of 13 Yes Hospitalization / Surgery History Type of Hospitalization/Surgery I and D Left calcaneus back surgery- laminectomy eye surgery- Bila cataracts shoulder arthroscopy Family and Social History Cancer: Yes - Father,Mother; Diabetes: Yes - Mother,Father; Heart Disease: No; Hereditary Spherocytosis: No; Hypertension: No; Kidney Disease: No; Lung Disease: Yes - Father; Seizures: No; Stroke: No; Thyroid Problems: Yes -  Mother; Tuberculosis: No; Never smoker; Marital Status - Married; Alcohol Use: Never; Drug Use: No History; Caffeine Use: Daily; Financial Concerns: No; Food, Clothing or Shelter Needs: No; Support System Lacking: No; Transportation Concerns: No Psychologist, prison and probation services) Signed: 08/10/2023 3:38:16 PM By: [name] MD FACS Entered By: [name] on 08/10/2023 10:26:40 -------------------------------------------------------------------------------- SuperBill Details Patient Name: Date of Service: [name], [name] 08/10/2023 Medical Record Number: [id number] Patient Account Number: [id number] Date of Birth/Sex: Treating RN: 1956-11-27 (67 y.o. 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, left ankle and foot E11.65 Type 2 diabetes mellitus with hyperglycemia E11.621 Type 2 diabetes mellitus with foot ulcer Facility Procedures : The patient participates with Medicare or their insurance follows the Medicare Facility Guidelines: CPT4 Code Description Modifier Quantity [phone] - DEBRIDE WOUND 1ST 20 SQ CM OR < 1 ICD-10 Diagnosis Description L97.424 Non-pressure chronic ulcer  of left heel and midfoot with necrosis of bone Physician Procedures : CPT4 Code Description Modifier [id number] - WC PHYS LEVEL 4 - EST PT 25 ICD-10 Diagnosis Description 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

## 2023-08-25 NOTE — Progress Notes (Signed)
[name], KUDELKA [name] ([id number]) [id number] Page 1 of 10 Visit Report for 08/10/2023 Arrival Information Details Patient Name: Date of Service: [place], [name] 08/10/2023 9:30 A M Medical Record Number: [id number] Patient Account Number: [id number] Date of Birth/Sex: Treating RN: [birth year] (67 y.o. M) Primary Care Viviano Bir: Foye [name] Other Clinician: Referring Tonye Tancredi: Treating Maida Widger/Extender: Kandis Cocking in Treatment: 40 Visit Information History Since Last Visit All ordered tests and consults were completed: No Patient Arrived: Knee Scooter Added or deleted any medications: No Arrival Time: 09:29 Any new allergies or adverse reactions: No Accompanied By: self Had a fall or experienced change in No Transfer Assistance: None activities of daily living that may affect Patient Identification Verified: Yes risk of falls: Secondary Verification Process Completed: Yes Signs or symptoms of abuse/neglect since last visito No Patient Requires Transmission-Based Precautions: No Hospitalized since last visit: No Patient Has Alerts: No Implantable device outside of the clinic excluding No cellular tissue based products placed in the center since last visit: Pain Present Now: No Electronic Signature(s) Signed: 08/10/2023 1:34:00 PM By: Dayton Scrape Entered By: Dayton Scrape on 08/10/2023 09:29:33 -------------------------------------------------------------------------------- Clinic Level of Care Assessment Details Patient Name: Date of Service: [place], [name] 08/10/2023 9:30 A M Medical Record Number: [id number] Patient Account Number: [id number] Date of Birth/Sex: Treating RN: 31-Dec-1955 (67 y.o. [name] Primary Care Lynnett Langlinais: Foye [name] Other Clinician: Referring Edyn Popoca: Treating Alexandr Yaworski/Extender: Kandis Cocking in Treatment: 47 Clinic Level of Care Assessment Items TOOL 1  Quantity Score []  - 0 Use when EandM and Procedure is performed on INITIAL visit ASSESSMENTS - Nursing Assessment / Reassessment []  - 0 General Physical Exam (combine w/ comprehensive assessment (listed just below) when performed on new pt. evals) []  - 0 Comprehensive Assessment (HX, ROS, Risk Assessments, Wounds Hx, etc.) ASSESSMENTS - Wound and Skin Assessment / Reassessment []  - 0 Dermatologic / Skin Assessment (not related to wound area) ASSESSMENTS - Ostomy and/or Continence Assessment and Care []  - 0 Incontinence Assessment and Management []  - 0 Ostomy Care Assessment and Management (repouching, etc.) PROCESS - Coordination of Care []  - 0 Simple Patient / Family Education for ongoing care []  - 0 Complex (extensive) Patient / Family Education for ongoing care [place], [place] ([id number]) [id number] Page 2 of 10 []  - 0 Staff obtains Consents, Records, T Results / Process Orders est []  - 0 Staff telephones HHA, Nursing Homes / Clarify orders / etc []  - 0 Routine Transfer to another Facility (non-emergent condition) []  - 0 Routine Hospital Admission (non-emergent condition) []  - 0 New Admissions / Manufacturing engineer / Ordering NPWT Apligraf, etc. , []  - 0 Emergency Hospital Admission (emergent condition) PROCESS - Special Needs []  - 0 Pediatric / Minor Patient Management []  - 0 Isolation Patient Management []  - 0 Hearing / Language / Visual special needs []  - 0 Assessment of Community assistance (transportation, D/C planning, etc.) []  - 0 Additional assistance / Altered mentation []  - 0 Support Surface(s) Assessment (bed, cushion, seat, etc.) INTERVENTIONS - Miscellaneous []  - 0 External ear exam []  - 0 Patient Transfer (multiple staff / Nurse, adult / Similar devices) []  - 0 Simple Staple / Suture removal (25 or less) []  - 0 Complex Staple / Suture removal (26 or more) []  - 0 Hypo/Hyperglycemic Management (do not check if  billed separately) []  - 0 Ankle / Brachial Index (ABI) - do not check if billed separately Has the patient been seen at the hospital within  the last three years: Yes Total Score: 0 Level Of Care: ____ Electronic Signature(s) Signed: 08/25/2023 2:13:46 PM By: Tommie Ard RN Entered By: Tommie Ard on 08/10/2023 09:48:55 -------------------------------------------------------------------------------- Encounter Discharge Information Details Patient Name: Date of Service: [place], [name] 08/10/2023 9:30 A M Medical Record Number: [id number] Patient Account Number: [id number] Date of Birth/Sex: Treating RN: [birth year] (67 y.o. [name] Primary Care Shelvie Salsberry: Foye [name] Other Clinician: Referring Adriann Thau: Treating Pelham Hennick/Extender: Kandis Cocking in Treatment: 50 Encounter Discharge Information Items Post Procedure Vitals Discharge Condition: Stable Temperature (F): 98.7 Ambulatory Status: Knee Scooter Pulse (bpm): 86 Discharge Destination: Home Respiratory Rate (breaths/min): 18 Transportation: Private Auto Blood Pressure (mmHg): 154/77 Accompanied By: self Schedule Follow-up Appointment: Yes Clinical Summary of Care: Electronic Signature(s) Signed: 08/25/2023 2:13:46 PM By: Tommie Ard RN Entered By: Tommie Ard on 08/10/2023 10:29:57 [name] ([id number]) [id number] Page 3 of 10 -------------------------------------------------------------------------------- Lower Extremity Assessment Details Patient Name: Date of Service: [name], [name] 08/10/2023 9:30 A M Medical Record Number: [id number] Patient Account Number: [id number] Date of Birth/Sex: Treating RN: 1956/04/29 (67 y.o. [name] Primary Care Cornelis Kluver: Foye [name] Other Clinician: Referring Briell Paulette: Treating [name]/Extender: Kandis Cocking in Treatment: 47 Edema Assessment Assessed: [name]: No]  [name]: No] Edema: [Left: Ye] [Right: s] Calf Left: Right: Point of Measurement: From Medial Instep 46.5 cm Ankle Left: Right: Point of Measurement: From Medial Instep 25.5 cm Vascular Assessment Extremity colors, hair growth, and conditions: Extremity Color: [Left:Hyperpigmented] Hair Growth on Extremity: [Left:No] Temperature of Extremity: [Left:Warm < 3 seconds] Electronic Signature(s) Signed: 08/25/2023 2:13:46 PM By: Tommie Ard RN Entered By: Tommie Ard on 08/10/2023 09:46:36 -------------------------------------------------------------------------------- Multi Wound Chart Details Patient Name: Date of Service: [name], [name] 08/10/2023 9:30 A M Medical Record Number: [id number] Patient Account Number: [id number] Date of Birth/Sex: Treating RN: Apr 13, 1956 (67 y.o. M) Primary Care Migel Hannis: Foye [name] Other Clinician: Referring Huberta Tompkins: Treating [place]/Extender: Kandis Cocking in Treatment: 93 Vital Signs Height(in): 74 Pulse(bpm): 86 Weight(lbs): 245 Blood Pressure(mmHg): 154/77 Body Mass Index(BMI): 31.5 Temperature(F): 98.7 Respiratory Rate(breaths/min): 18 [1:Photos:] [N/A:N/A [id number] Page 4 of 10] Left Calcaneus Left, Dorsal Foot N/A Wound Location: Pressure Injury Pressure Injury N/A Wounding Event: Diabetic Wound/Ulcer of the Lower Diabetic Wound/Ulcer of the Lower N/A Primary Etiology: Extremity Extremity Cataracts, Sleep Apnea, Hypertension, Cataracts, Sleep Apnea, Hypertension, N/A Comorbid History: Type II Diabetes, Osteomyelitis, Type II Diabetes, Osteomyelitis, Neuropathy Neuropathy 03/09/2019 07/09/2022 N/A Date Acquired: 52 47 N/A Weeks of Treatment: Open Open N/A Wound Status: No No N/A Wound Recurrence: No Yes N/A Clustered Wound: N/A 2 N/A Clustered Quantity: 5x5x1.2 0.1x0.1x0.1 N/A Measurements L x W x D (cm) 19.635 0.008 N/A A (cm) : rea 23.562 0.001 N/A Volume  (cm) : 9.10% 99.90% N/A % Reduction in A rea: -9.10% 99.90% N/A % Reduction in Volume: Grade 3 Grade 2 N/A Classification: Large Small N/A Exudate A mount: Purulent Serous N/A Exudate Type: yellow, brown, green amber N/A Exudate Color: Thickened Flat and Intact N/A Wound Margin: Medium (34-66%) None Present (0%) N/A Granulation A mount: Pink, Pale, Hyper-granulation N/A N/A Granulation Quality: Medium (34-66%) None Present (0%) N/A Necrotic A mount: Fat Layer (Subcutaneous Tissue): Yes Fat Layer (Subcutaneous Tissue): Yes N/A Exposed Structures: Fascia: No Fascia: No Tendon: No Tendon: No Muscle: No Muscle: No Joint: No Joint: No Bone: No Bone: No Small (1-33%) Large (67-100%) N/A Epithelialization: Debridement - Selective/Open Wound N/A N/A Debridement: Pre-procedure Verification/Time Out 09:58 N/A N/A  Taken: Lidocaine 4% Topical Solution N/A N/A Pain Control: Slough N/A N/A Tissue Debrided: Skin/Epidermis N/A N/A Level: 19.62 N/A N/A Debridement A (sq cm): rea Curette N/A N/A Instrument: Minimum N/A N/A Bleeding: Pressure N/A N/A Hemostasis A chieved: Procedure was tolerated well N/A N/A Debridement Treatment Response: 5x5x1.2 N/A N/A Post Debridement Measurements L x W x D (cm) 23.562 N/A N/A Post Debridement Volume: (cm) Callus: Yes Excoriation: No N/A Periwound Skin Texture: Scarring: No Maceration: Yes Dry/Scaly: Yes N/A Periwound Skin Moisture: Dry/Scaly: No Maceration: No Erythema: No Rubor: No N/A Periwound Skin Color: Rubor: No No Abnormality No Abnormality N/A Temperature: Debridement N/A N/A Procedures Performed: Treatment Notes Electronic Signature(s) Signed: 08/10/2023 10:25:43 AM By: [name] MD FACS Entered By: [name] on 08/10/2023 10:25:43 -------------------------------------------------------------------------------- Multi-Disciplinary Care Plan Details Patient Name: Date of Service: [place], [name] NA  [name]. 08/10/2023 9:30 A M Medical Record Number: [id number] Patient Account Number: [id number] Date of Birth/Sex: Treating RN: 03-11-1956 (67 y.o. [name] Primary Care Ruwayda Curet: Foye [name] Other Clinician: Referring Hartford Maulden: Treating Mikolaj Woolstenhulme/Extender: Kandis Cocking in Treatment: [address] [place] [name] ([id number]) [id number] Page 5 of 10 Multidisciplinary Care Plan reviewed with physician Active Inactive Nutrition Nursing Diagnoses: Imbalanced nutrition Impaired glucose control: actual or potential Potential for alteratiion in Nutrition/Potential for imbalanced nutrition Goals: Patient/caregiver will maintain therapeutic glucose control Date Initiated: 05/27/2023 Target Resolution Date: 08/19/2023 Goal Status: Active Interventions: Assess HgA1c results as ordered upon admission and as needed Assess patient nutrition upon admission and as needed per policy Provide education on elevated blood sugars and impact on wound healing Treatment Activities: Patient referred to Primary Care Physician for further nutritional evaluation : 05/27/2023 Notes: 8/28- Juven x2/day Wound/Skin Impairment Nursing Diagnoses: Impaired tissue integrity Goals: Patient/caregiver will verbalize understanding of skin care regimen Date Initiated: 10/06/2022 Target Resolution Date: 08/19/2023 Goal Status: Active Ulcer/skin breakdown will have a volume reduction of 30% by week 4 Date Initiated: 09/10/2022 Date Inactivated: 10/06/2022 Target Resolution Date: 10/08/2022 Goal Status: Unmet Unmet Reason: osteo, HBOT Ulcer/skin breakdown will have a volume reduction of 50% by week 8 Date Initiated: 10/06/2022 Date Inactivated: 02/14/2023 Target Resolution Date: 01/14/2023 Unmet Reason: insufficient perfusion Goal Status: Unmet to Left lower extremity Ulcer/skin breakdown will have a volume reduction of 80% by week 12 Date Initiated: 02/14/2023 Date  Inactivated: 03/14/2023 Target Resolution Date: 03/15/2023 Goal Status: Unmet Unmet Reason: osteomyelitis Interventions: Assess ulceration(s) every visit Provide education on ulcer and skin care Treatment Activities: Consult for HBO : 09/10/2022 Skin care regimen initiated : 09/10/2022 Notes: NPWT started 12/27/22 Electronic Signature(s) Signed: 08/25/2023 2:13:46 PM By: Tommie Ard RN Entered By: Tommie Ard on 08/10/2023 10:04:01 -------------------------------------------------------------------------------- Pain Assessment Details Patient Name: Date of Service: [place], [name] 08/10/2023 9:30 A M Medical Record Number: [id number] Patient Account Number: [id number] Date of Birth/Sex: Treating RN: 1956-09-22 (67 y.o. Tymaine Coello, Windy Fast [name] ([id number]) [id number] Page 6 of 10 Primary Care Sharvi Mooneyhan: Foye [name] Other Clinician: Referring Shreyansh Tiffany: Treating Chandelle Harkey/Extender: Kandis Cocking in Treatment: 71 Active Problems Location of Pain Severity and Description of Pain Patient Has Paino No Site Locations Pain Management and Medication Current Pain Management: Electronic Signature(s) Signed: 08/10/2023 1:34:00 PM By: Dayton Scrape Entered By: Dayton Scrape on 08/10/2023 09:32:41 -------------------------------------------------------------------------------- Patient/Caregiver Education Details Patient Name: Date of Service: [place], [name] 8/28/2024andnbsp9:30 A M Medical Record Number: [id number] Patient Account Number: [id number] Date of Birth/Gender: Treating RN: 1956-02-14 (67 y.o. [name] Primary Care Physician: Foye [name] Other Clinician:  Referring Physician: Treating Physician/Extender: Kandis Cocking in Treatment: 60 Education Assessment Education Provided To: Patient Education Topics Provided Wound Debridement: Methods: Explain/Verbal Responses: Reinforcements needed,  State content correctly Wound/Skin Impairment: Responses: Reinforcements needed, State content correctly Electronic Signature(s) Signed: 08/25/2023 2:13:46 PM By: Tommie Ard RN Entered By: Tommie Ard on 08/10/2023 09:48:09 [name] ([id number]) [id number] Page 7 of 10 -------------------------------------------------------------------------------- Wound Assessment Details Patient Name: Date of Service: [place], [name] 08/10/2023 9:30 A M Medical Record Number: [id number] Patient Account Number: [id number] Date of Birth/Sex: Treating RN: 1956/12/06 (67 y.o. M) Primary Care Madlynn Lundeen: Foye [name] Other Clinician: Referring Dayson Aboud: Treating Freedom Lopezperez/Extender: Kandis Cocking in Treatment: 47 Wound Status Wound Number: 1 Primary Diabetic Wound/Ulcer of the Lower Extremity Etiology: Wound Location: Left Calcaneus Wound Open Wounding Event: Pressure Injury Status: Date Acquired: 03/09/2019 Comorbid Cataracts, Sleep Apnea, Hypertension, Type II Diabetes, Weeks Of Treatment: 47 History: Osteomyelitis, Neuropathy Clustered Wound: No Photos Wound Measurements Length: (cm) 5 Width: (cm) 5 Depth: (cm) 1.2 Area: (cm) 19.635 Volume: (cm) 23.562 % Reduction in Area: 9.1% % Reduction in Volume: -9.1% Epithelialization: Small (1-33%) Wound Description Classification: Grade 3 Wound Margin: Thickened Exudate Amount: Large Exudate Type: Purulent Exudate Color: yellow, brown, green Foul Odor After Cleansing: No Slough/Fibrino Yes Wound Bed Granulation Amount: Medium (34-66%) Exposed Structure Granulation Quality: Pink, Pale, Hyper-granulation Fascia Exposed: No Necrotic Amount: Medium (34-66%) Fat Layer (Subcutaneous Tissue) Exposed: Yes Necrotic Quality: Adherent Slough Tendon Exposed: No Muscle Exposed: No Joint Exposed: No Bone Exposed: No Periwound Skin Texture Texture Color No Abnormalities Noted: No No  Abnormalities Noted: Yes Callus: Yes Temperature / Pain Temperature: No Abnormality Moisture No Abnormalities Noted: No Dry / Scaly: No Maceration: Yes Treatment Notes Wound #1 (Calcaneus) Wound Laterality: Left DIETER, HUYCK ([id number]) [id number] Page 8 of 10 Cleanser Soap and Water Discharge Instruction: May shower and wash wound with dial antibacterial soap and water prior to dressing change. Vashe 5.8 (oz) Discharge Instruction: Cleanse the wound with Vashe prior to applying a clean dressing using gauze sponges, not tissue or cotton balls. Peri-Wound Care Zinc Oxide Ointment 30g tube Discharge Instruction: Apply Zinc Oxide to periwound with each dressing change Topical Gentamicin Discharge Instruction: Apply to wound when keystone not available Mupirocin Ointment Discharge Instruction: when keystone not available apply Mupirocin (Bactroban) as instructed keystone antibiotic compound Primary Dressing Maxorb Extra Ag+ Alginate Dressing, 4x4.75 (in/in) Discharge Instruction: Apply to wound bed as instructed Secondary Dressing ABD Pad, 8x10 Discharge Instruction: Apply over primary dressing as directed. Woven Gauze Sponge, Non-Sterile 4x4 in Discharge Instruction: Wet with Vashe we to dry. Zetuvit Absorbent Pad, 4x4 (in/in) Discharge Instruction: apply over primary dressing Secured With Coban Self-Adherent Wrap 4x5 (in/yd) Discharge Instruction: Secure with Coban as directed. Kerlix Roll Sterile, 4.5x3.1 (in/yd) Discharge Instruction: Secure with Kerlix as directed. Compression Wrap Compression Stockings Add-Ons Electronic Signature(s) Signed: 08/10/2023 1:34:00 PM By: Dayton Scrape Entered By: Dayton Scrape on 08/10/2023 09:40:41 -------------------------------------------------------------------------------- Wound Assessment Details Patient Name: Date of Service: [place], [name] 08/10/2023 9:30 A M Medical Record Number: [id number] Patient  Account Number: [id number] Date of Birth/Sex: Treating RN: 1956/02/13 (67 y.o. M) Primary Care Danney Bungert: Foye [name] Other Clinician: Referring Fran Mcree: Treating Antwian Santaana/Extender: Kandis Cocking in Treatment: 47 Wound Status Wound Number: 2 Primary Diabetic Wound/Ulcer of the Lower Extremity Etiology: Wound Location: Left, Dorsal Foot Wound Open Wounding Event: Pressure Injury Status: Date Acquired: 07/09/2022 Comorbid Cataracts, Sleep Apnea, Hypertension, Type II Diabetes, Weeks Of Treatment:  47 History: Osteomyelitis, Neuropathy Clustered Wound: Yes Photos TAYVEON, O[facility] [name] ([id number]) [id number] Page 9 of 10 Wound Measurements Length: (cm) 0.1 Width: (cm) 0.1 Depth: (cm) 0.1 Clustered Quantity: 2 Area: (cm) 0.008 Volume: (cm) 0.001 % Reduction in Area: 99.9% % Reduction in Volume: 99.9% Epithelialization: Large (67-100%) Wound Description Classification: Grade 2 Wound Margin: Flat and Intact Exudate Amount: Small Exudate Type: Serous Exudate Color: amber Foul Odor After Cleansing: No Slough/Fibrino Yes Wound Bed Granulation Amount: None Present (0%) Exposed Structure Necrotic Amount: None Present (0%) Fascia Exposed: No Fat Layer (Subcutaneous Tissue) Exposed: Yes Tendon Exposed: No Muscle Exposed: No Joint Exposed: No Bone Exposed: No Periwound Skin Texture Texture Color No Abnormalities Noted: No No Abnormalities Noted: Yes Excoriation: No Temperature / Pain Scarring: No Temperature: No Abnormality Moisture No Abnormalities Noted: No Dry / Scaly: Yes Maceration: No Treatment Notes Wound #2 (Foot) Wound Laterality: Dorsal, Left Cleanser Soap and Water Discharge Instruction: May shower and wash wound with dial antibacterial soap and water prior to dressing change. Vashe 5.8 (oz) Discharge Instruction: Cleanse the wound with Vashe prior to applying a clean dressing using gauze sponges, not tissue  or cotton balls. Peri-Wound Care Topical Gentamicin Discharge Instruction: Apply to wound when keystone not available Mupirocin Ointment Discharge Instruction: when keystone not available apply Mupirocin (Bactroban) as instructed keystone Primary Dressing Maxorb Extra Ag+ Alginate Dressing, 2x2 (in/in) Discharge Instruction: Apply to wound bed as instructed Secondary Dressing Woven Gauze Sponge, Non-Sterile 4x4 in Discharge Instruction: Wet with Vashe we to dry. WHITE, ENGELBERG [name] ([id number]) [id number] Page 10 of 10 Zetuvit Plus 4x8 in Discharge Instruction: Apply over primary dressing as directed. Secured With [employer] 4x5 (in/yd) Discharge Instruction: Secure with Coban as directed. Kerlix Roll Sterile, 4.5x3.1 (in/yd) Discharge Instruction: Secure with Kerlix as directed. Compression Wrap Compression Stockings Add-Ons Electronic Signature(s) Signed: 08/10/2023 1:34:00 PM By: Dayton Scrape Entered By: Dayton Scrape on 08/10/2023 09:41:23 -------------------------------------------------------------------------------- Vitals Details Patient Name: Date of Service: [name], [name] 08/10/2023 9:30 A M Medical Record Number: [id number] Patient Account Number: [id number] Date of Birth/Sex: Treating RN: 1956/09/04 (67 y.o. M) Primary Care Braxtyn Bojarski: Foye [name] Other Clinician: Referring Jadasia Haws: Treating Michell Kader/Extender: Kandis Cocking in Treatment: 78 Vital Signs Time Taken: 09:29 Temperature (F): 98.7 Height (in): 74 Pulse (bpm): 86 Weight (lbs): 245 Respiratory Rate (breaths/min): 18 Body Mass Index (BMI): 31.5 Blood Pressure (mmHg): 154/77 Reference Range: 80 - 120 mg / dl Electronic Signature(s) Signed: 08/10/2023 1:34:00 PM By: Dayton Scrape Entered By: Dayton Scrape on 08/10/2023 09:32:26

## 2023-08-26 ENCOUNTER — Encounter (HOSPITAL_BASED_OUTPATIENT_CLINIC_OR_DEPARTMENT_OTHER): Payer: Medicare Other | Attending: General Surgery | Admitting: General Surgery

## 2023-08-26 DIAGNOSIS — E785 Hyperlipidemia, unspecified: Secondary | ICD-10-CM | POA: Insufficient documentation

## 2023-08-26 DIAGNOSIS — M86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, left ankle and foot: Secondary | ICD-10-CM | POA: Insufficient documentation

## 2023-08-26 DIAGNOSIS — E11621 Type 2 diabetes mellitus with foot ulcer: Secondary | ICD-10-CM | POA: Insufficient documentation

## 2023-08-26 DIAGNOSIS — I10 Essential (primary) hypertension: Secondary | ICD-10-CM | POA: Insufficient documentation

## 2023-08-26 DIAGNOSIS — L97423 Non-pressure chronic ulcer of left heel and midfoot with necrosis of muscle: Secondary | ICD-10-CM | POA: Insufficient documentation

## 2023-08-26 DIAGNOSIS — E1140 Type 2 diabetes mellitus with diabetic neuropathy, unspecified: Secondary | ICD-10-CM | POA: Insufficient documentation

## 2023-08-26 DIAGNOSIS — G4730 Sleep apnea, unspecified: Secondary | ICD-10-CM | POA: Diagnosis not present

## 2023-08-26 DIAGNOSIS — L97524 Non-pressure chronic ulcer of other part of left foot with necrosis of bone: Secondary | ICD-10-CM | POA: Insufficient documentation

## 2023-08-29 NOTE — Progress Notes (Signed)
Vashe prior to applying a clean dressing using gauze sponges, not tissue or cotton balls. Peri-Wound Care: Zinc Oxide Ointment 30g tube 1 x Per Day/30 Days Discharge Instructions: Apply Zinc Oxide to periwound with each dressing change Topical:  Gentamicin 1 x Per Day/30 Days Discharge Instructions: Apply to wound when keystone not available Topical: Mupirocin Ointment 1 x Per Day/30 Days Discharge Instructions: when keystone not available apply Mupirocin (Bactroban) as instructed Topical: keystone antibiotic compound 1 x Per Day/30 Days Prim Dressing: Maxorb Extra Ag+ Alginate Dressing, 4x4.75 (in/in) (Generic) 1 x Per Day/30 Days ary Discharge Instructions: Apply to wound bed as instructed Secondary Dressing: ABD Pad, 8x10 (Generic) 1 x Per Day/30 Days Discharge Instructions: Apply over primary dressing as directed. Secondary Dressing: Woven Gauze Sponge, Non-Sterile 4x4 in (Generic) 1 x Per Day/30 Days Discharge Instructions: Wet with Vashe we to dry. Secured With: Coban Self-Adherent Wrap 4x5 (in/yd) (Generic) 1 x Per Day/30 Days Discharge Instructions: Secure with Coban as directed. Secured With: [employer], 4.5x3.1 (in/yd) (Generic) 1 x Per Day/30 Days Discharge Instructions: Secure with Kerlix as directed. WOUND #2: - Foot Wound Laterality: Dorsal, Left Cleanser: Soap and Water 1 x Per Day/30 Days Discharge Instructions: May shower and wash wound with dial antibacterial soap and water prior to dressing change. Cleanser: Vashe 5.8 (oz) 1 x Per Day/30 Days Discharge Instructions: Cleanse the wound with Vashe prior to applying a clean dressing using gauze sponges, not tissue or cotton balls. Topical: Gentamicin 1 x Per Day/30 Days Discharge Instructions: Apply to wound when keystone not available Topical: Mupirocin Ointment 1 x Per Day/30 Days Discharge Instructions: when keystone not available apply Mupirocin (Bactroban) as instructed Topical: keystone 1 x Per Day/30 Days Prim Dressing: Maxorb Extra Ag+ Alginate Dressing, 2x2 (in/in) (Generic) 1 x Per Day/30 Days ary Discharge Instructions: Apply to wound bed as instructed Secondary Dressing: Woven Gauze Sponge, Non-Sterile 4x4 in (Generic) 1 x Per Day/30  Days Discharge Instructions: Wet with Vashe we to dry. Secondary Dressing: Zetuvit Plus 4x8 in (Generic) 1 x Per Day/30 Days Discharge Instructions: Apply over primary dressing as directed. Secured With: Coban Self-Adherent Wrap 4x5 (in/yd) (Generic) 1 x Per Day/30 Days Discharge Instructions: Secure with Coban as directed. Secured With: [employer], 4.5x3.1 (in/yd) (Generic) 1 x Per Day/30 Days Discharge Instructions: Secure with Kerlix as directed. 08/26/2023: The dorsal foot wound reopened. The patient thinks that it secondary to adhesive, but it honestly looks more like moisture-related breakdown. He has had copious drainage from his heel, such that his dressing and sock were saturated. The heel is unchanged with its usual accumulation of wet callus around the edges and light slough on the surface. I used a curette to debride slough and eschar off of the dorsal foot wound and callus, skin, and slough from the heel wound. In clinic. Using a mixture of topical gentamicin and mupirocin on his wounds with silver alginate and Kerlix and Coban wrap. Although, he is using Keystone topical antibiotic compound (prescription drug) with the aforementioned contact layers and absorbent backing. Follow-up in 2 weeks. Electronic Signature(s) Signed: 08/26/2023 9:22:31 AM By: [name] MD FACS Entered By: [name] on 08/26/2023 06:22:31 -------------------------------------------------------------------------------- HxROS Details Patient Name: Date of Service: [name], [name] 08/26/2023 8:30 A M Medical Record Number: [id number] Patient Account Number: [id number] Date of Birth/Sex: Treating RN: February 06, 1956 (67 y.o. M) Primary Care Provider: Foye [name] Other Clinician: Referring Provider: Treating Provider/Extender: Kandis Cocking in Treatment: 50 Eyes Medical History:  wound is stable. He has appointment with Dr. Lenell Antu was rescheduled for next Tuesday. He is working on arrangements for his dog and his wife to facilitate below-knee amputation. 04/12/2023: The heel ulcer looks about the same. Bone is not exposed. There is moist callus heaped up around the edges. The dorsal foot wound is stable. He will be seeing Dr. Lenell Antu this afternoon to discuss amputation. 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 The dorsal foot wound is a little bit smaller and shallower. It is not as irritated as it often looks. There is slough on the surface. He met with Dr. Lenell Antu and is still not ready to pursue amputation at this time. 05/16/2023: The dorsal foot wound has gotten larger and there appears to be a lot of drainage resulting in periwound tissue maceration. The calcaneal wound also looks similar, with heaped up macerated callus around the edges and a layer of slough on the surface. 05/27/2023: No real change to his wounds. He is still avoiding amputation. 06/10/2023: The dorsal foot wound actually looks a little bit better this week. There is light slough on the surface. On the plantar heel wound, he has less wet callus accumulation. There is rubbery slough on the surface. 06/24/2023: The dorsal foot wound has epithelialized substantially and there are just 2 remaining open areas, both with hypertrophic granulation tissue. The plantar heel wound has deteriorated and there are areas that now have bone easily identified. There is wet callus around the edges, as usual. 07/22/2023: The dorsal foot wound is nearly closed. There is just a tiny residual open area. The plantar heel wound has improved and the bone is covered. As usual, there is heaped up wet callus around the edges. 08/10/2023: The dorsal foot wound has healed. The plantar heel wound has actually improved with  better granulation tissue and actually measures smaller. He has the usual accumulation of wet callus around the edges. 08/26/2023: The dorsal foot wound reopened. The patient thinks that it secondary to adhesive, but it honestly looks more like moisture-related breakdown. He has had copious drainage from his heel, such that his dressing and sock were saturated. The heel is unchanged with its usual accumulation of wet callus around the edges and light slough on the surface. Electronic Signature(s) Signed: 08/26/2023 9:18:00 AM By: [name] MD FACS Entered By: [name] on 08/26/2023 06:18:00 -------------------------------------------------------------------------------- Physical Exam Details Patient Name: Date of Service: [name], [name] 08/26/2023 8:30 A M Medical Record Number: [id number] Patient Account Number: [id number] Date of Birth/Sex: Treating RN: 1956/05/15 (67 y.o. M) Primary Care Provider: Foye [name] Other Clinician: Referring Provider: Treating Provider/Extender: Kandis Cocking in Treatment: 50 Constitutional Hypertensive, asymptomatic. . . . no acute distress. Respiratory Normal work of breathing on room air. Notes 08/26/2023: The dorsal foot wound reopened. The heel is unchanged with its usual accumulation of wet callus around the edges and light slough on the surface. YONEO, MAGPANTAY ([id number]) [id number] Page 5 of 14 Electronic Signature(s) Signed: 08/26/2023 9:21:03 AM By: [name] MD FACS Entered By: [name] on 08/26/2023 06:21:02 -------------------------------------------------------------------------------- Physician Orders Details Patient Name: Date of Service: [name], [name] 08/26/2023 8:30 A M Medical Record Number: [id number] Patient Account Number: [id number] Date of Birth/Sex: Treating RN: [birth year] (67 y.o. [name] Primary Care Provider: Foye [name] Other Clinician: Referring Provider: Treating Provider/Extender: Kayleen Memos  Vashe prior to applying a clean dressing using gauze sponges, not tissue or cotton balls. Peri-Wound Care: Zinc Oxide Ointment 30g tube 1 x Per Day/30 Days Discharge Instructions: Apply Zinc Oxide to periwound with each dressing change Topical:  Gentamicin 1 x Per Day/30 Days Discharge Instructions: Apply to wound when keystone not available Topical: Mupirocin Ointment 1 x Per Day/30 Days Discharge Instructions: when keystone not available apply Mupirocin (Bactroban) as instructed Topical: keystone antibiotic compound 1 x Per Day/30 Days Prim Dressing: Maxorb Extra Ag+ Alginate Dressing, 4x4.75 (in/in) (Generic) 1 x Per Day/30 Days ary Discharge Instructions: Apply to wound bed as instructed Secondary Dressing: ABD Pad, 8x10 (Generic) 1 x Per Day/30 Days Discharge Instructions: Apply over primary dressing as directed. Secondary Dressing: Woven Gauze Sponge, Non-Sterile 4x4 in (Generic) 1 x Per Day/30 Days Discharge Instructions: Wet with Vashe we to dry. Secured With: Coban Self-Adherent Wrap 4x5 (in/yd) (Generic) 1 x Per Day/30 Days Discharge Instructions: Secure with Coban as directed. Secured With: [employer], 4.5x3.1 (in/yd) (Generic) 1 x Per Day/30 Days Discharge Instructions: Secure with Kerlix as directed. WOUND #2: - Foot Wound Laterality: Dorsal, Left Cleanser: Soap and Water 1 x Per Day/30 Days Discharge Instructions: May shower and wash wound with dial antibacterial soap and water prior to dressing change. Cleanser: Vashe 5.8 (oz) 1 x Per Day/30 Days Discharge Instructions: Cleanse the wound with Vashe prior to applying a clean dressing using gauze sponges, not tissue or cotton balls. Topical: Gentamicin 1 x Per Day/30 Days Discharge Instructions: Apply to wound when keystone not available Topical: Mupirocin Ointment 1 x Per Day/30 Days Discharge Instructions: when keystone not available apply Mupirocin (Bactroban) as instructed Topical: keystone 1 x Per Day/30 Days Prim Dressing: Maxorb Extra Ag+ Alginate Dressing, 2x2 (in/in) (Generic) 1 x Per Day/30 Days ary Discharge Instructions: Apply to wound bed as instructed Secondary Dressing: Woven Gauze Sponge, Non-Sterile 4x4 in (Generic) 1 x Per Day/30  Days Discharge Instructions: Wet with Vashe we to dry. Secondary Dressing: Zetuvit Plus 4x8 in (Generic) 1 x Per Day/30 Days Discharge Instructions: Apply over primary dressing as directed. Secured With: Coban Self-Adherent Wrap 4x5 (in/yd) (Generic) 1 x Per Day/30 Days Discharge Instructions: Secure with Coban as directed. Secured With: [employer], 4.5x3.1 (in/yd) (Generic) 1 x Per Day/30 Days Discharge Instructions: Secure with Kerlix as directed. 08/26/2023: The dorsal foot wound reopened. The patient thinks that it secondary to adhesive, but it honestly looks more like moisture-related breakdown. He has had copious drainage from his heel, such that his dressing and sock were saturated. The heel is unchanged with its usual accumulation of wet callus around the edges and light slough on the surface. I used a curette to debride slough and eschar off of the dorsal foot wound and callus, skin, and slough from the heel wound. In clinic. Using a mixture of topical gentamicin and mupirocin on his wounds with silver alginate and Kerlix and Coban wrap. Although, he is using Keystone topical antibiotic compound (prescription drug) with the aforementioned contact layers and absorbent backing. Follow-up in 2 weeks. Electronic Signature(s) Signed: 08/26/2023 9:22:31 AM By: [name] MD FACS Entered By: [name] on 08/26/2023 06:22:31 -------------------------------------------------------------------------------- HxROS Details Patient Name: Date of Service: [name], [name] 08/26/2023 8:30 A M Medical Record Number: [id number] Patient Account Number: [id number] Date of Birth/Sex: Treating RN: February 06, 1956 (67 y.o. M) Primary Care Provider: Foye [name] Other Clinician: Referring Provider: Treating Provider/Extender: Kandis Cocking in Treatment: 50 Eyes Medical History:  wound is stable. He has appointment with Dr. Lenell Antu was rescheduled for next Tuesday. He is working on arrangements for his dog and his wife to facilitate below-knee amputation. 04/12/2023: The heel ulcer looks about the same. Bone is not exposed. There is moist callus heaped up around the edges. The dorsal foot wound is stable. He will be seeing Dr. Lenell Antu this afternoon to discuss amputation. 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 The dorsal foot wound is a little bit smaller and shallower. It is not as irritated as it often looks. There is slough on the surface. He met with Dr. Lenell Antu and is still not ready to pursue amputation at this time. 05/16/2023: The dorsal foot wound has gotten larger and there appears to be a lot of drainage resulting in periwound tissue maceration. The calcaneal wound also looks similar, with heaped up macerated callus around the edges and a layer of slough on the surface. 05/27/2023: No real change to his wounds. He is still avoiding amputation. 06/10/2023: The dorsal foot wound actually looks a little bit better this week. There is light slough on the surface. On the plantar heel wound, he has less wet callus accumulation. There is rubbery slough on the surface. 06/24/2023: The dorsal foot wound has epithelialized substantially and there are just 2 remaining open areas, both with hypertrophic granulation tissue. The plantar heel wound has deteriorated and there are areas that now have bone easily identified. There is wet callus around the edges, as usual. 07/22/2023: The dorsal foot wound is nearly closed. There is just a tiny residual open area. The plantar heel wound has improved and the bone is covered. As usual, there is heaped up wet callus around the edges. 08/10/2023: The dorsal foot wound has healed. The plantar heel wound has actually improved with  better granulation tissue and actually measures smaller. He has the usual accumulation of wet callus around the edges. 08/26/2023: The dorsal foot wound reopened. The patient thinks that it secondary to adhesive, but it honestly looks more like moisture-related breakdown. He has had copious drainage from his heel, such that his dressing and sock were saturated. The heel is unchanged with its usual accumulation of wet callus around the edges and light slough on the surface. Electronic Signature(s) Signed: 08/26/2023 9:18:00 AM By: [name] MD FACS Entered By: [name] on 08/26/2023 06:18:00 -------------------------------------------------------------------------------- Physical Exam Details Patient Name: Date of Service: [name], [name] 08/26/2023 8:30 A M Medical Record Number: [id number] Patient Account Number: [id number] Date of Birth/Sex: Treating RN: 1956/05/15 (67 y.o. M) Primary Care Provider: Foye [name] Other Clinician: Referring Provider: Treating Provider/Extender: Kandis Cocking in Treatment: 50 Constitutional Hypertensive, asymptomatic. . . . no acute distress. Respiratory Normal work of breathing on room air. Notes 08/26/2023: The dorsal foot wound reopened. The heel is unchanged with its usual accumulation of wet callus around the edges and light slough on the surface. YONEO, MAGPANTAY ([id number]) [id number] Page 5 of 14 Electronic Signature(s) Signed: 08/26/2023 9:21:03 AM By: [name] MD FACS Entered By: [name] on 08/26/2023 06:21:02 -------------------------------------------------------------------------------- Physician Orders Details Patient Name: Date of Service: [name], [name] 08/26/2023 8:30 A M Medical Record Number: [id number] Patient Account Number: [id number] Date of Birth/Sex: Treating RN: [birth year] (67 y.o. [name] Primary Care Provider: Foye [name] Other Clinician: Referring Provider: Treating Provider/Extender: Kayleen Memos  Vashe prior to applying a clean dressing using gauze sponges, not tissue or cotton balls. Peri-Wound Care: Zinc Oxide Ointment 30g tube 1 x Per Day/30 Days Discharge Instructions: Apply Zinc Oxide to periwound with each dressing change Topical:  Gentamicin 1 x Per Day/30 Days Discharge Instructions: Apply to wound when keystone not available Topical: Mupirocin Ointment 1 x Per Day/30 Days Discharge Instructions: when keystone not available apply Mupirocin (Bactroban) as instructed Topical: keystone antibiotic compound 1 x Per Day/30 Days Prim Dressing: Maxorb Extra Ag+ Alginate Dressing, 4x4.75 (in/in) (Generic) 1 x Per Day/30 Days ary Discharge Instructions: Apply to wound bed as instructed Secondary Dressing: ABD Pad, 8x10 (Generic) 1 x Per Day/30 Days Discharge Instructions: Apply over primary dressing as directed. Secondary Dressing: Woven Gauze Sponge, Non-Sterile 4x4 in (Generic) 1 x Per Day/30 Days Discharge Instructions: Wet with Vashe we to dry. Secured With: Coban Self-Adherent Wrap 4x5 (in/yd) (Generic) 1 x Per Day/30 Days Discharge Instructions: Secure with Coban as directed. Secured With: [employer], 4.5x3.1 (in/yd) (Generic) 1 x Per Day/30 Days Discharge Instructions: Secure with Kerlix as directed. WOUND #2: - Foot Wound Laterality: Dorsal, Left Cleanser: Soap and Water 1 x Per Day/30 Days Discharge Instructions: May shower and wash wound with dial antibacterial soap and water prior to dressing change. Cleanser: Vashe 5.8 (oz) 1 x Per Day/30 Days Discharge Instructions: Cleanse the wound with Vashe prior to applying a clean dressing using gauze sponges, not tissue or cotton balls. Topical: Gentamicin 1 x Per Day/30 Days Discharge Instructions: Apply to wound when keystone not available Topical: Mupirocin Ointment 1 x Per Day/30 Days Discharge Instructions: when keystone not available apply Mupirocin (Bactroban) as instructed Topical: keystone 1 x Per Day/30 Days Prim Dressing: Maxorb Extra Ag+ Alginate Dressing, 2x2 (in/in) (Generic) 1 x Per Day/30 Days ary Discharge Instructions: Apply to wound bed as instructed Secondary Dressing: Woven Gauze Sponge, Non-Sterile 4x4 in (Generic) 1 x Per Day/30  Days Discharge Instructions: Wet with Vashe we to dry. Secondary Dressing: Zetuvit Plus 4x8 in (Generic) 1 x Per Day/30 Days Discharge Instructions: Apply over primary dressing as directed. Secured With: Coban Self-Adherent Wrap 4x5 (in/yd) (Generic) 1 x Per Day/30 Days Discharge Instructions: Secure with Coban as directed. Secured With: [employer], 4.5x3.1 (in/yd) (Generic) 1 x Per Day/30 Days Discharge Instructions: Secure with Kerlix as directed. 08/26/2023: The dorsal foot wound reopened. The patient thinks that it secondary to adhesive, but it honestly looks more like moisture-related breakdown. He has had copious drainage from his heel, such that his dressing and sock were saturated. The heel is unchanged with its usual accumulation of wet callus around the edges and light slough on the surface. I used a curette to debride slough and eschar off of the dorsal foot wound and callus, skin, and slough from the heel wound. In clinic. Using a mixture of topical gentamicin and mupirocin on his wounds with silver alginate and Kerlix and Coban wrap. Although, he is using Keystone topical antibiotic compound (prescription drug) with the aforementioned contact layers and absorbent backing. Follow-up in 2 weeks. Electronic Signature(s) Signed: 08/26/2023 9:22:31 AM By: [name] MD FACS Entered By: [name] on 08/26/2023 06:22:31 -------------------------------------------------------------------------------- HxROS Details Patient Name: Date of Service: [name], [name] 08/26/2023 8:30 A M Medical Record Number: [id number] Patient Account Number: [id number] Date of Birth/Sex: Treating RN: February 06, 1956 (67 y.o. M) Primary Care Provider: Foye [name] Other Clinician: Referring Provider: Treating Provider/Extender: Kandis Cocking in Treatment: 50 Eyes Medical History:  Vashe prior to applying a clean dressing using gauze sponges, not tissue or cotton balls. Peri-Wound Care: Zinc Oxide Ointment 30g tube 1 x Per Day/30 Days Discharge Instructions: Apply Zinc Oxide to periwound with each dressing change Topical:  Gentamicin 1 x Per Day/30 Days Discharge Instructions: Apply to wound when keystone not available Topical: Mupirocin Ointment 1 x Per Day/30 Days Discharge Instructions: when keystone not available apply Mupirocin (Bactroban) as instructed Topical: keystone antibiotic compound 1 x Per Day/30 Days Prim Dressing: Maxorb Extra Ag+ Alginate Dressing, 4x4.75 (in/in) (Generic) 1 x Per Day/30 Days ary Discharge Instructions: Apply to wound bed as instructed Secondary Dressing: ABD Pad, 8x10 (Generic) 1 x Per Day/30 Days Discharge Instructions: Apply over primary dressing as directed. Secondary Dressing: Woven Gauze Sponge, Non-Sterile 4x4 in (Generic) 1 x Per Day/30 Days Discharge Instructions: Wet with Vashe we to dry. Secured With: Coban Self-Adherent Wrap 4x5 (in/yd) (Generic) 1 x Per Day/30 Days Discharge Instructions: Secure with Coban as directed. Secured With: [employer], 4.5x3.1 (in/yd) (Generic) 1 x Per Day/30 Days Discharge Instructions: Secure with Kerlix as directed. WOUND #2: - Foot Wound Laterality: Dorsal, Left Cleanser: Soap and Water 1 x Per Day/30 Days Discharge Instructions: May shower and wash wound with dial antibacterial soap and water prior to dressing change. Cleanser: Vashe 5.8 (oz) 1 x Per Day/30 Days Discharge Instructions: Cleanse the wound with Vashe prior to applying a clean dressing using gauze sponges, not tissue or cotton balls. Topical: Gentamicin 1 x Per Day/30 Days Discharge Instructions: Apply to wound when keystone not available Topical: Mupirocin Ointment 1 x Per Day/30 Days Discharge Instructions: when keystone not available apply Mupirocin (Bactroban) as instructed Topical: keystone 1 x Per Day/30 Days Prim Dressing: Maxorb Extra Ag+ Alginate Dressing, 2x2 (in/in) (Generic) 1 x Per Day/30 Days ary Discharge Instructions: Apply to wound bed as instructed Secondary Dressing: Woven Gauze Sponge, Non-Sterile 4x4 in (Generic) 1 x Per Day/30  Days Discharge Instructions: Wet with Vashe we to dry. Secondary Dressing: Zetuvit Plus 4x8 in (Generic) 1 x Per Day/30 Days Discharge Instructions: Apply over primary dressing as directed. Secured With: Coban Self-Adherent Wrap 4x5 (in/yd) (Generic) 1 x Per Day/30 Days Discharge Instructions: Secure with Coban as directed. Secured With: [employer], 4.5x3.1 (in/yd) (Generic) 1 x Per Day/30 Days Discharge Instructions: Secure with Kerlix as directed. 08/26/2023: The dorsal foot wound reopened. The patient thinks that it secondary to adhesive, but it honestly looks more like moisture-related breakdown. He has had copious drainage from his heel, such that his dressing and sock were saturated. The heel is unchanged with its usual accumulation of wet callus around the edges and light slough on the surface. I used a curette to debride slough and eschar off of the dorsal foot wound and callus, skin, and slough from the heel wound. In clinic. Using a mixture of topical gentamicin and mupirocin on his wounds with silver alginate and Kerlix and Coban wrap. Although, he is using Keystone topical antibiotic compound (prescription drug) with the aforementioned contact layers and absorbent backing. Follow-up in 2 weeks. Electronic Signature(s) Signed: 08/26/2023 9:22:31 AM By: [name] MD FACS Entered By: [name] on 08/26/2023 06:22:31 -------------------------------------------------------------------------------- HxROS Details Patient Name: Date of Service: [name], [name] 08/26/2023 8:30 A M Medical Record Number: [id number] Patient Account Number: [id number] Date of Birth/Sex: Treating RN: February 06, 1956 (67 y.o. M) Primary Care Provider: Foye [name] Other Clinician: Referring Provider: Treating Provider/Extender: Kandis Cocking in Treatment: 50 Eyes Medical History:  wound is stable. He has appointment with Dr. Lenell Antu was rescheduled for next Tuesday. He is working on arrangements for his dog and his wife to facilitate below-knee amputation. 04/12/2023: The heel ulcer looks about the same. Bone is not exposed. There is moist callus heaped up around the edges. The dorsal foot wound is stable. He will be seeing Dr. Lenell Antu this afternoon to discuss amputation. 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 The dorsal foot wound is a little bit smaller and shallower. It is not as irritated as it often looks. There is slough on the surface. He met with Dr. Lenell Antu and is still not ready to pursue amputation at this time. 05/16/2023: The dorsal foot wound has gotten larger and there appears to be a lot of drainage resulting in periwound tissue maceration. The calcaneal wound also looks similar, with heaped up macerated callus around the edges and a layer of slough on the surface. 05/27/2023: No real change to his wounds. He is still avoiding amputation. 06/10/2023: The dorsal foot wound actually looks a little bit better this week. There is light slough on the surface. On the plantar heel wound, he has less wet callus accumulation. There is rubbery slough on the surface. 06/24/2023: The dorsal foot wound has epithelialized substantially and there are just 2 remaining open areas, both with hypertrophic granulation tissue. The plantar heel wound has deteriorated and there are areas that now have bone easily identified. There is wet callus around the edges, as usual. 07/22/2023: The dorsal foot wound is nearly closed. There is just a tiny residual open area. The plantar heel wound has improved and the bone is covered. As usual, there is heaped up wet callus around the edges. 08/10/2023: The dorsal foot wound has healed. The plantar heel wound has actually improved with  better granulation tissue and actually measures smaller. He has the usual accumulation of wet callus around the edges. 08/26/2023: The dorsal foot wound reopened. The patient thinks that it secondary to adhesive, but it honestly looks more like moisture-related breakdown. He has had copious drainage from his heel, such that his dressing and sock were saturated. The heel is unchanged with its usual accumulation of wet callus around the edges and light slough on the surface. Electronic Signature(s) Signed: 08/26/2023 9:18:00 AM By: [name] MD FACS Entered By: [name] on 08/26/2023 06:18:00 -------------------------------------------------------------------------------- Physical Exam Details Patient Name: Date of Service: [name], [name] 08/26/2023 8:30 A M Medical Record Number: [id number] Patient Account Number: [id number] Date of Birth/Sex: Treating RN: 1956/05/15 (67 y.o. M) Primary Care Provider: Foye [name] Other Clinician: Referring Provider: Treating Provider/Extender: Kandis Cocking in Treatment: 50 Constitutional Hypertensive, asymptomatic. . . . no acute distress. Respiratory Normal work of breathing on room air. Notes 08/26/2023: The dorsal foot wound reopened. The heel is unchanged with its usual accumulation of wet callus around the edges and light slough on the surface. YONEO, MAGPANTAY ([id number]) [id number] Page 5 of 14 Electronic Signature(s) Signed: 08/26/2023 9:21:03 AM By: [name] MD FACS Entered By: [name] on 08/26/2023 06:21:02 -------------------------------------------------------------------------------- Physician Orders Details Patient Name: Date of Service: [name], [name] 08/26/2023 8:30 A M Medical Record Number: [id number] Patient Account Number: [id number] Date of Birth/Sex: Treating RN: [birth year] (67 y.o. [name] Primary Care Provider: Foye [name] Other Clinician: Referring Provider: Treating Provider/Extender: Kayleen Memos  Vashe prior to applying a clean dressing using gauze sponges, not tissue or cotton balls. Peri-Wound Care: Zinc Oxide Ointment 30g tube 1 x Per Day/30 Days Discharge Instructions: Apply Zinc Oxide to periwound with each dressing change Topical:  Gentamicin 1 x Per Day/30 Days Discharge Instructions: Apply to wound when keystone not available Topical: Mupirocin Ointment 1 x Per Day/30 Days Discharge Instructions: when keystone not available apply Mupirocin (Bactroban) as instructed Topical: keystone antibiotic compound 1 x Per Day/30 Days Prim Dressing: Maxorb Extra Ag+ Alginate Dressing, 4x4.75 (in/in) (Generic) 1 x Per Day/30 Days ary Discharge Instructions: Apply to wound bed as instructed Secondary Dressing: ABD Pad, 8x10 (Generic) 1 x Per Day/30 Days Discharge Instructions: Apply over primary dressing as directed. Secondary Dressing: Woven Gauze Sponge, Non-Sterile 4x4 in (Generic) 1 x Per Day/30 Days Discharge Instructions: Wet with Vashe we to dry. Secured With: Coban Self-Adherent Wrap 4x5 (in/yd) (Generic) 1 x Per Day/30 Days Discharge Instructions: Secure with Coban as directed. Secured With: [employer], 4.5x3.1 (in/yd) (Generic) 1 x Per Day/30 Days Discharge Instructions: Secure with Kerlix as directed. WOUND #2: - Foot Wound Laterality: Dorsal, Left Cleanser: Soap and Water 1 x Per Day/30 Days Discharge Instructions: May shower and wash wound with dial antibacterial soap and water prior to dressing change. Cleanser: Vashe 5.8 (oz) 1 x Per Day/30 Days Discharge Instructions: Cleanse the wound with Vashe prior to applying a clean dressing using gauze sponges, not tissue or cotton balls. Topical: Gentamicin 1 x Per Day/30 Days Discharge Instructions: Apply to wound when keystone not available Topical: Mupirocin Ointment 1 x Per Day/30 Days Discharge Instructions: when keystone not available apply Mupirocin (Bactroban) as instructed Topical: keystone 1 x Per Day/30 Days Prim Dressing: Maxorb Extra Ag+ Alginate Dressing, 2x2 (in/in) (Generic) 1 x Per Day/30 Days ary Discharge Instructions: Apply to wound bed as instructed Secondary Dressing: Woven Gauze Sponge, Non-Sterile 4x4 in (Generic) 1 x Per Day/30  Days Discharge Instructions: Wet with Vashe we to dry. Secondary Dressing: Zetuvit Plus 4x8 in (Generic) 1 x Per Day/30 Days Discharge Instructions: Apply over primary dressing as directed. Secured With: Coban Self-Adherent Wrap 4x5 (in/yd) (Generic) 1 x Per Day/30 Days Discharge Instructions: Secure with Coban as directed. Secured With: [employer], 4.5x3.1 (in/yd) (Generic) 1 x Per Day/30 Days Discharge Instructions: Secure with Kerlix as directed. 08/26/2023: The dorsal foot wound reopened. The patient thinks that it secondary to adhesive, but it honestly looks more like moisture-related breakdown. He has had copious drainage from his heel, such that his dressing and sock were saturated. The heel is unchanged with its usual accumulation of wet callus around the edges and light slough on the surface. I used a curette to debride slough and eschar off of the dorsal foot wound and callus, skin, and slough from the heel wound. In clinic. Using a mixture of topical gentamicin and mupirocin on his wounds with silver alginate and Kerlix and Coban wrap. Although, he is using Keystone topical antibiotic compound (prescription drug) with the aforementioned contact layers and absorbent backing. Follow-up in 2 weeks. Electronic Signature(s) Signed: 08/26/2023 9:22:31 AM By: [name] MD FACS Entered By: [name] on 08/26/2023 06:22:31 -------------------------------------------------------------------------------- HxROS Details Patient Name: Date of Service: [name], [name] 08/26/2023 8:30 A M Medical Record Number: [id number] Patient Account Number: [id number] Date of Birth/Sex: Treating RN: February 06, 1956 (67 y.o. M) Primary Care Provider: Foye [name] Other Clinician: Referring Provider: Treating Provider/Extender: Kandis Cocking in Treatment: 50 Eyes Medical History:  Vashe prior to applying a clean dressing using gauze sponges, not tissue or cotton balls. Peri-Wound Care: Zinc Oxide Ointment 30g tube 1 x Per Day/30 Days Discharge Instructions: Apply Zinc Oxide to periwound with each dressing change Topical:  Gentamicin 1 x Per Day/30 Days Discharge Instructions: Apply to wound when keystone not available Topical: Mupirocin Ointment 1 x Per Day/30 Days Discharge Instructions: when keystone not available apply Mupirocin (Bactroban) as instructed Topical: keystone antibiotic compound 1 x Per Day/30 Days Prim Dressing: Maxorb Extra Ag+ Alginate Dressing, 4x4.75 (in/in) (Generic) 1 x Per Day/30 Days ary Discharge Instructions: Apply to wound bed as instructed Secondary Dressing: ABD Pad, 8x10 (Generic) 1 x Per Day/30 Days Discharge Instructions: Apply over primary dressing as directed. Secondary Dressing: Woven Gauze Sponge, Non-Sterile 4x4 in (Generic) 1 x Per Day/30 Days Discharge Instructions: Wet with Vashe we to dry. Secured With: Coban Self-Adherent Wrap 4x5 (in/yd) (Generic) 1 x Per Day/30 Days Discharge Instructions: Secure with Coban as directed. Secured With: [employer], 4.5x3.1 (in/yd) (Generic) 1 x Per Day/30 Days Discharge Instructions: Secure with Kerlix as directed. WOUND #2: - Foot Wound Laterality: Dorsal, Left Cleanser: Soap and Water 1 x Per Day/30 Days Discharge Instructions: May shower and wash wound with dial antibacterial soap and water prior to dressing change. Cleanser: Vashe 5.8 (oz) 1 x Per Day/30 Days Discharge Instructions: Cleanse the wound with Vashe prior to applying a clean dressing using gauze sponges, not tissue or cotton balls. Topical: Gentamicin 1 x Per Day/30 Days Discharge Instructions: Apply to wound when keystone not available Topical: Mupirocin Ointment 1 x Per Day/30 Days Discharge Instructions: when keystone not available apply Mupirocin (Bactroban) as instructed Topical: keystone 1 x Per Day/30 Days Prim Dressing: Maxorb Extra Ag+ Alginate Dressing, 2x2 (in/in) (Generic) 1 x Per Day/30 Days ary Discharge Instructions: Apply to wound bed as instructed Secondary Dressing: Woven Gauze Sponge, Non-Sterile 4x4 in (Generic) 1 x Per Day/30  Days Discharge Instructions: Wet with Vashe we to dry. Secondary Dressing: Zetuvit Plus 4x8 in (Generic) 1 x Per Day/30 Days Discharge Instructions: Apply over primary dressing as directed. Secured With: Coban Self-Adherent Wrap 4x5 (in/yd) (Generic) 1 x Per Day/30 Days Discharge Instructions: Secure with Coban as directed. Secured With: [employer], 4.5x3.1 (in/yd) (Generic) 1 x Per Day/30 Days Discharge Instructions: Secure with Kerlix as directed. 08/26/2023: The dorsal foot wound reopened. The patient thinks that it secondary to adhesive, but it honestly looks more like moisture-related breakdown. He has had copious drainage from his heel, such that his dressing and sock were saturated. The heel is unchanged with its usual accumulation of wet callus around the edges and light slough on the surface. I used a curette to debride slough and eschar off of the dorsal foot wound and callus, skin, and slough from the heel wound. In clinic. Using a mixture of topical gentamicin and mupirocin on his wounds with silver alginate and Kerlix and Coban wrap. Although, he is using Keystone topical antibiotic compound (prescription drug) with the aforementioned contact layers and absorbent backing. Follow-up in 2 weeks. Electronic Signature(s) Signed: 08/26/2023 9:22:31 AM By: [name] MD FACS Entered By: [name] on 08/26/2023 06:22:31 -------------------------------------------------------------------------------- HxROS Details Patient Name: Date of Service: [name], [name] 08/26/2023 8:30 A M Medical Record Number: [id number] Patient Account Number: [id number] Date of Birth/Sex: Treating RN: February 06, 1956 (67 y.o. M) Primary Care Provider: Foye [name] Other Clinician: Referring Provider: Treating Provider/Extender: Kandis Cocking in Treatment: 50 Eyes Medical History:  Vashe prior to applying a clean dressing using gauze sponges, not tissue or cotton balls. Peri-Wound Care: Zinc Oxide Ointment 30g tube 1 x Per Day/30 Days Discharge Instructions: Apply Zinc Oxide to periwound with each dressing change Topical:  Gentamicin 1 x Per Day/30 Days Discharge Instructions: Apply to wound when keystone not available Topical: Mupirocin Ointment 1 x Per Day/30 Days Discharge Instructions: when keystone not available apply Mupirocin (Bactroban) as instructed Topical: keystone antibiotic compound 1 x Per Day/30 Days Prim Dressing: Maxorb Extra Ag+ Alginate Dressing, 4x4.75 (in/in) (Generic) 1 x Per Day/30 Days ary Discharge Instructions: Apply to wound bed as instructed Secondary Dressing: ABD Pad, 8x10 (Generic) 1 x Per Day/30 Days Discharge Instructions: Apply over primary dressing as directed. Secondary Dressing: Woven Gauze Sponge, Non-Sterile 4x4 in (Generic) 1 x Per Day/30 Days Discharge Instructions: Wet with Vashe we to dry. Secured With: Coban Self-Adherent Wrap 4x5 (in/yd) (Generic) 1 x Per Day/30 Days Discharge Instructions: Secure with Coban as directed. Secured With: [employer], 4.5x3.1 (in/yd) (Generic) 1 x Per Day/30 Days Discharge Instructions: Secure with Kerlix as directed. WOUND #2: - Foot Wound Laterality: Dorsal, Left Cleanser: Soap and Water 1 x Per Day/30 Days Discharge Instructions: May shower and wash wound with dial antibacterial soap and water prior to dressing change. Cleanser: Vashe 5.8 (oz) 1 x Per Day/30 Days Discharge Instructions: Cleanse the wound with Vashe prior to applying a clean dressing using gauze sponges, not tissue or cotton balls. Topical: Gentamicin 1 x Per Day/30 Days Discharge Instructions: Apply to wound when keystone not available Topical: Mupirocin Ointment 1 x Per Day/30 Days Discharge Instructions: when keystone not available apply Mupirocin (Bactroban) as instructed Topical: keystone 1 x Per Day/30 Days Prim Dressing: Maxorb Extra Ag+ Alginate Dressing, 2x2 (in/in) (Generic) 1 x Per Day/30 Days ary Discharge Instructions: Apply to wound bed as instructed Secondary Dressing: Woven Gauze Sponge, Non-Sterile 4x4 in (Generic) 1 x Per Day/30  Days Discharge Instructions: Wet with Vashe we to dry. Secondary Dressing: Zetuvit Plus 4x8 in (Generic) 1 x Per Day/30 Days Discharge Instructions: Apply over primary dressing as directed. Secured With: Coban Self-Adherent Wrap 4x5 (in/yd) (Generic) 1 x Per Day/30 Days Discharge Instructions: Secure with Coban as directed. Secured With: [employer], 4.5x3.1 (in/yd) (Generic) 1 x Per Day/30 Days Discharge Instructions: Secure with Kerlix as directed. 08/26/2023: The dorsal foot wound reopened. The patient thinks that it secondary to adhesive, but it honestly looks more like moisture-related breakdown. He has had copious drainage from his heel, such that his dressing and sock were saturated. The heel is unchanged with its usual accumulation of wet callus around the edges and light slough on the surface. I used a curette to debride slough and eschar off of the dorsal foot wound and callus, skin, and slough from the heel wound. In clinic. Using a mixture of topical gentamicin and mupirocin on his wounds with silver alginate and Kerlix and Coban wrap. Although, he is using Keystone topical antibiotic compound (prescription drug) with the aforementioned contact layers and absorbent backing. Follow-up in 2 weeks. Electronic Signature(s) Signed: 08/26/2023 9:22:31 AM By: [name] MD FACS Entered By: [name] on 08/26/2023 06:22:31 -------------------------------------------------------------------------------- HxROS Details Patient Name: Date of Service: [name], [name] 08/26/2023 8:30 A M Medical Record Number: [id number] Patient Account Number: [id number] Date of Birth/Sex: Treating RN: February 06, 1956 (67 y.o. M) Primary Care Provider: Foye [name] Other Clinician: Referring Provider: Treating Provider/Extender: Kandis Cocking in Treatment: 50 Eyes Medical History:  Vashe prior to applying a clean dressing using gauze sponges, not tissue or cotton balls. Peri-Wound Care: Zinc Oxide Ointment 30g tube 1 x Per Day/30 Days Discharge Instructions: Apply Zinc Oxide to periwound with each dressing change Topical:  Gentamicin 1 x Per Day/30 Days Discharge Instructions: Apply to wound when keystone not available Topical: Mupirocin Ointment 1 x Per Day/30 Days Discharge Instructions: when keystone not available apply Mupirocin (Bactroban) as instructed Topical: keystone antibiotic compound 1 x Per Day/30 Days Prim Dressing: Maxorb Extra Ag+ Alginate Dressing, 4x4.75 (in/in) (Generic) 1 x Per Day/30 Days ary Discharge Instructions: Apply to wound bed as instructed Secondary Dressing: ABD Pad, 8x10 (Generic) 1 x Per Day/30 Days Discharge Instructions: Apply over primary dressing as directed. Secondary Dressing: Woven Gauze Sponge, Non-Sterile 4x4 in (Generic) 1 x Per Day/30 Days Discharge Instructions: Wet with Vashe we to dry. Secured With: Coban Self-Adherent Wrap 4x5 (in/yd) (Generic) 1 x Per Day/30 Days Discharge Instructions: Secure with Coban as directed. Secured With: [employer], 4.5x3.1 (in/yd) (Generic) 1 x Per Day/30 Days Discharge Instructions: Secure with Kerlix as directed. WOUND #2: - Foot Wound Laterality: Dorsal, Left Cleanser: Soap and Water 1 x Per Day/30 Days Discharge Instructions: May shower and wash wound with dial antibacterial soap and water prior to dressing change. Cleanser: Vashe 5.8 (oz) 1 x Per Day/30 Days Discharge Instructions: Cleanse the wound with Vashe prior to applying a clean dressing using gauze sponges, not tissue or cotton balls. Topical: Gentamicin 1 x Per Day/30 Days Discharge Instructions: Apply to wound when keystone not available Topical: Mupirocin Ointment 1 x Per Day/30 Days Discharge Instructions: when keystone not available apply Mupirocin (Bactroban) as instructed Topical: keystone 1 x Per Day/30 Days Prim Dressing: Maxorb Extra Ag+ Alginate Dressing, 2x2 (in/in) (Generic) 1 x Per Day/30 Days ary Discharge Instructions: Apply to wound bed as instructed Secondary Dressing: Woven Gauze Sponge, Non-Sterile 4x4 in (Generic) 1 x Per Day/30  Days Discharge Instructions: Wet with Vashe we to dry. Secondary Dressing: Zetuvit Plus 4x8 in (Generic) 1 x Per Day/30 Days Discharge Instructions: Apply over primary dressing as directed. Secured With: Coban Self-Adherent Wrap 4x5 (in/yd) (Generic) 1 x Per Day/30 Days Discharge Instructions: Secure with Coban as directed. Secured With: [employer], 4.5x3.1 (in/yd) (Generic) 1 x Per Day/30 Days Discharge Instructions: Secure with Kerlix as directed. 08/26/2023: The dorsal foot wound reopened. The patient thinks that it secondary to adhesive, but it honestly looks more like moisture-related breakdown. He has had copious drainage from his heel, such that his dressing and sock were saturated. The heel is unchanged with its usual accumulation of wet callus around the edges and light slough on the surface. I used a curette to debride slough and eschar off of the dorsal foot wound and callus, skin, and slough from the heel wound. In clinic. Using a mixture of topical gentamicin and mupirocin on his wounds with silver alginate and Kerlix and Coban wrap. Although, he is using Keystone topical antibiotic compound (prescription drug) with the aforementioned contact layers and absorbent backing. Follow-up in 2 weeks. Electronic Signature(s) Signed: 08/26/2023 9:22:31 AM By: [name] MD FACS Entered By: [name] on 08/26/2023 06:22:31 -------------------------------------------------------------------------------- HxROS Details Patient Name: Date of Service: [name], [name] 08/26/2023 8:30 A M Medical Record Number: [id number] Patient Account Number: [id number] Date of Birth/Sex: Treating RN: February 06, 1956 (67 y.o. M) Primary Care Provider: Foye [name] Other Clinician: Referring Provider: Treating Provider/Extender: Kandis Cocking in Treatment: 50 Eyes Medical History:  Vashe prior to applying a clean dressing using gauze sponges, not tissue or cotton balls. Peri-Wound Care: Zinc Oxide Ointment 30g tube 1 x Per Day/30 Days Discharge Instructions: Apply Zinc Oxide to periwound with each dressing change Topical:  Gentamicin 1 x Per Day/30 Days Discharge Instructions: Apply to wound when keystone not available Topical: Mupirocin Ointment 1 x Per Day/30 Days Discharge Instructions: when keystone not available apply Mupirocin (Bactroban) as instructed Topical: keystone antibiotic compound 1 x Per Day/30 Days Prim Dressing: Maxorb Extra Ag+ Alginate Dressing, 4x4.75 (in/in) (Generic) 1 x Per Day/30 Days ary Discharge Instructions: Apply to wound bed as instructed Secondary Dressing: ABD Pad, 8x10 (Generic) 1 x Per Day/30 Days Discharge Instructions: Apply over primary dressing as directed. Secondary Dressing: Woven Gauze Sponge, Non-Sterile 4x4 in (Generic) 1 x Per Day/30 Days Discharge Instructions: Wet with Vashe we to dry. Secured With: Coban Self-Adherent Wrap 4x5 (in/yd) (Generic) 1 x Per Day/30 Days Discharge Instructions: Secure with Coban as directed. Secured With: [employer], 4.5x3.1 (in/yd) (Generic) 1 x Per Day/30 Days Discharge Instructions: Secure with Kerlix as directed. WOUND #2: - Foot Wound Laterality: Dorsal, Left Cleanser: Soap and Water 1 x Per Day/30 Days Discharge Instructions: May shower and wash wound with dial antibacterial soap and water prior to dressing change. Cleanser: Vashe 5.8 (oz) 1 x Per Day/30 Days Discharge Instructions: Cleanse the wound with Vashe prior to applying a clean dressing using gauze sponges, not tissue or cotton balls. Topical: Gentamicin 1 x Per Day/30 Days Discharge Instructions: Apply to wound when keystone not available Topical: Mupirocin Ointment 1 x Per Day/30 Days Discharge Instructions: when keystone not available apply Mupirocin (Bactroban) as instructed Topical: keystone 1 x Per Day/30 Days Prim Dressing: Maxorb Extra Ag+ Alginate Dressing, 2x2 (in/in) (Generic) 1 x Per Day/30 Days ary Discharge Instructions: Apply to wound bed as instructed Secondary Dressing: Woven Gauze Sponge, Non-Sterile 4x4 in (Generic) 1 x Per Day/30  Days Discharge Instructions: Wet with Vashe we to dry. Secondary Dressing: Zetuvit Plus 4x8 in (Generic) 1 x Per Day/30 Days Discharge Instructions: Apply over primary dressing as directed. Secured With: Coban Self-Adherent Wrap 4x5 (in/yd) (Generic) 1 x Per Day/30 Days Discharge Instructions: Secure with Coban as directed. Secured With: [employer], 4.5x3.1 (in/yd) (Generic) 1 x Per Day/30 Days Discharge Instructions: Secure with Kerlix as directed. 08/26/2023: The dorsal foot wound reopened. The patient thinks that it secondary to adhesive, but it honestly looks more like moisture-related breakdown. He has had copious drainage from his heel, such that his dressing and sock were saturated. The heel is unchanged with its usual accumulation of wet callus around the edges and light slough on the surface. I used a curette to debride slough and eschar off of the dorsal foot wound and callus, skin, and slough from the heel wound. In clinic. Using a mixture of topical gentamicin and mupirocin on his wounds with silver alginate and Kerlix and Coban wrap. Although, he is using Keystone topical antibiotic compound (prescription drug) with the aforementioned contact layers and absorbent backing. Follow-up in 2 weeks. Electronic Signature(s) Signed: 08/26/2023 9:22:31 AM By: [name] MD FACS Entered By: [name] on 08/26/2023 06:22:31 -------------------------------------------------------------------------------- HxROS Details Patient Name: Date of Service: [name], [name] 08/26/2023 8:30 A M Medical Record Number: [id number] Patient Account Number: [id number] Date of Birth/Sex: Treating RN: February 06, 1956 (67 y.o. M) Primary Care Provider: Foye [name] Other Clinician: Referring Provider: Treating Provider/Extender: Kandis Cocking in Treatment: 50 Eyes Medical History:  Vashe prior to applying a clean dressing using gauze sponges, not tissue or cotton balls. Peri-Wound Care: Zinc Oxide Ointment 30g tube 1 x Per Day/30 Days Discharge Instructions: Apply Zinc Oxide to periwound with each dressing change Topical:  Gentamicin 1 x Per Day/30 Days Discharge Instructions: Apply to wound when keystone not available Topical: Mupirocin Ointment 1 x Per Day/30 Days Discharge Instructions: when keystone not available apply Mupirocin (Bactroban) as instructed Topical: keystone antibiotic compound 1 x Per Day/30 Days Prim Dressing: Maxorb Extra Ag+ Alginate Dressing, 4x4.75 (in/in) (Generic) 1 x Per Day/30 Days ary Discharge Instructions: Apply to wound bed as instructed Secondary Dressing: ABD Pad, 8x10 (Generic) 1 x Per Day/30 Days Discharge Instructions: Apply over primary dressing as directed. Secondary Dressing: Woven Gauze Sponge, Non-Sterile 4x4 in (Generic) 1 x Per Day/30 Days Discharge Instructions: Wet with Vashe we to dry. Secured With: Coban Self-Adherent Wrap 4x5 (in/yd) (Generic) 1 x Per Day/30 Days Discharge Instructions: Secure with Coban as directed. Secured With: [employer], 4.5x3.1 (in/yd) (Generic) 1 x Per Day/30 Days Discharge Instructions: Secure with Kerlix as directed. WOUND #2: - Foot Wound Laterality: Dorsal, Left Cleanser: Soap and Water 1 x Per Day/30 Days Discharge Instructions: May shower and wash wound with dial antibacterial soap and water prior to dressing change. Cleanser: Vashe 5.8 (oz) 1 x Per Day/30 Days Discharge Instructions: Cleanse the wound with Vashe prior to applying a clean dressing using gauze sponges, not tissue or cotton balls. Topical: Gentamicin 1 x Per Day/30 Days Discharge Instructions: Apply to wound when keystone not available Topical: Mupirocin Ointment 1 x Per Day/30 Days Discharge Instructions: when keystone not available apply Mupirocin (Bactroban) as instructed Topical: keystone 1 x Per Day/30 Days Prim Dressing: Maxorb Extra Ag+ Alginate Dressing, 2x2 (in/in) (Generic) 1 x Per Day/30 Days ary Discharge Instructions: Apply to wound bed as instructed Secondary Dressing: Woven Gauze Sponge, Non-Sterile 4x4 in (Generic) 1 x Per Day/30  Days Discharge Instructions: Wet with Vashe we to dry. Secondary Dressing: Zetuvit Plus 4x8 in (Generic) 1 x Per Day/30 Days Discharge Instructions: Apply over primary dressing as directed. Secured With: Coban Self-Adherent Wrap 4x5 (in/yd) (Generic) 1 x Per Day/30 Days Discharge Instructions: Secure with Coban as directed. Secured With: [employer], 4.5x3.1 (in/yd) (Generic) 1 x Per Day/30 Days Discharge Instructions: Secure with Kerlix as directed. 08/26/2023: The dorsal foot wound reopened. The patient thinks that it secondary to adhesive, but it honestly looks more like moisture-related breakdown. He has had copious drainage from his heel, such that his dressing and sock were saturated. The heel is unchanged with its usual accumulation of wet callus around the edges and light slough on the surface. I used a curette to debride slough and eschar off of the dorsal foot wound and callus, skin, and slough from the heel wound. In clinic. Using a mixture of topical gentamicin and mupirocin on his wounds with silver alginate and Kerlix and Coban wrap. Although, he is using Keystone topical antibiotic compound (prescription drug) with the aforementioned contact layers and absorbent backing. Follow-up in 2 weeks. Electronic Signature(s) Signed: 08/26/2023 9:22:31 AM By: [name] MD FACS Entered By: [name] on 08/26/2023 06:22:31 -------------------------------------------------------------------------------- HxROS Details Patient Name: Date of Service: [name], [name] 08/26/2023 8:30 A M Medical Record Number: [id number] Patient Account Number: [id number] Date of Birth/Sex: Treating RN: February 06, 1956 (67 y.o. M) Primary Care Provider: Foye [name] Other Clinician: Referring Provider: Treating Provider/Extender: Kandis Cocking in Treatment: 50 Eyes Medical History:  wound is stable. He has appointment with Dr. Lenell Antu was rescheduled for next Tuesday. He is working on arrangements for his dog and his wife to facilitate below-knee amputation. 04/12/2023: The heel ulcer looks about the same. Bone is not exposed. There is moist callus heaped up around the edges. The dorsal foot wound is stable. He will be seeing Dr. Lenell Antu this afternoon to discuss amputation. 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 The dorsal foot wound is a little bit smaller and shallower. It is not as irritated as it often looks. There is slough on the surface. He met with Dr. Lenell Antu and is still not ready to pursue amputation at this time. 05/16/2023: The dorsal foot wound has gotten larger and there appears to be a lot of drainage resulting in periwound tissue maceration. The calcaneal wound also looks similar, with heaped up macerated callus around the edges and a layer of slough on the surface. 05/27/2023: No real change to his wounds. He is still avoiding amputation. 06/10/2023: The dorsal foot wound actually looks a little bit better this week. There is light slough on the surface. On the plantar heel wound, he has less wet callus accumulation. There is rubbery slough on the surface. 06/24/2023: The dorsal foot wound has epithelialized substantially and there are just 2 remaining open areas, both with hypertrophic granulation tissue. The plantar heel wound has deteriorated and there are areas that now have bone easily identified. There is wet callus around the edges, as usual. 07/22/2023: The dorsal foot wound is nearly closed. There is just a tiny residual open area. The plantar heel wound has improved and the bone is covered. As usual, there is heaped up wet callus around the edges. 08/10/2023: The dorsal foot wound has healed. The plantar heel wound has actually improved with  better granulation tissue and actually measures smaller. He has the usual accumulation of wet callus around the edges. 08/26/2023: The dorsal foot wound reopened. The patient thinks that it secondary to adhesive, but it honestly looks more like moisture-related breakdown. He has had copious drainage from his heel, such that his dressing and sock were saturated. The heel is unchanged with its usual accumulation of wet callus around the edges and light slough on the surface. Electronic Signature(s) Signed: 08/26/2023 9:18:00 AM By: [name] MD FACS Entered By: [name] on 08/26/2023 06:18:00 -------------------------------------------------------------------------------- Physical Exam Details Patient Name: Date of Service: [name], [name] 08/26/2023 8:30 A M Medical Record Number: [id number] Patient Account Number: [id number] Date of Birth/Sex: Treating RN: 1956/05/15 (67 y.o. M) Primary Care Provider: Foye [name] Other Clinician: Referring Provider: Treating Provider/Extender: Kandis Cocking in Treatment: 50 Constitutional Hypertensive, asymptomatic. . . . no acute distress. Respiratory Normal work of breathing on room air. Notes 08/26/2023: The dorsal foot wound reopened. The heel is unchanged with its usual accumulation of wet callus around the edges and light slough on the surface. YONEO, MAGPANTAY ([id number]) [id number] Page 5 of 14 Electronic Signature(s) Signed: 08/26/2023 9:21:03 AM By: [name] MD FACS Entered By: [name] on 08/26/2023 06:21:02 -------------------------------------------------------------------------------- Physician Orders Details Patient Name: Date of Service: [name], [name] 08/26/2023 8:30 A M Medical Record Number: [id number] Patient Account Number: [id number] Date of Birth/Sex: Treating RN: [birth year] (67 y.o. [name] Primary Care Provider: Foye [name] Other Clinician: Referring Provider: Treating Provider/Extender: Kayleen Memos  Vashe prior to applying a clean dressing using gauze sponges, not tissue or cotton balls. Peri-Wound Care: Zinc Oxide Ointment 30g tube 1 x Per Day/30 Days Discharge Instructions: Apply Zinc Oxide to periwound with each dressing change Topical:  Gentamicin 1 x Per Day/30 Days Discharge Instructions: Apply to wound when keystone not available Topical: Mupirocin Ointment 1 x Per Day/30 Days Discharge Instructions: when keystone not available apply Mupirocin (Bactroban) as instructed Topical: keystone antibiotic compound 1 x Per Day/30 Days Prim Dressing: Maxorb Extra Ag+ Alginate Dressing, 4x4.75 (in/in) (Generic) 1 x Per Day/30 Days ary Discharge Instructions: Apply to wound bed as instructed Secondary Dressing: ABD Pad, 8x10 (Generic) 1 x Per Day/30 Days Discharge Instructions: Apply over primary dressing as directed. Secondary Dressing: Woven Gauze Sponge, Non-Sterile 4x4 in (Generic) 1 x Per Day/30 Days Discharge Instructions: Wet with Vashe we to dry. Secured With: Coban Self-Adherent Wrap 4x5 (in/yd) (Generic) 1 x Per Day/30 Days Discharge Instructions: Secure with Coban as directed. Secured With: [employer], 4.5x3.1 (in/yd) (Generic) 1 x Per Day/30 Days Discharge Instructions: Secure with Kerlix as directed. WOUND #2: - Foot Wound Laterality: Dorsal, Left Cleanser: Soap and Water 1 x Per Day/30 Days Discharge Instructions: May shower and wash wound with dial antibacterial soap and water prior to dressing change. Cleanser: Vashe 5.8 (oz) 1 x Per Day/30 Days Discharge Instructions: Cleanse the wound with Vashe prior to applying a clean dressing using gauze sponges, not tissue or cotton balls. Topical: Gentamicin 1 x Per Day/30 Days Discharge Instructions: Apply to wound when keystone not available Topical: Mupirocin Ointment 1 x Per Day/30 Days Discharge Instructions: when keystone not available apply Mupirocin (Bactroban) as instructed Topical: keystone 1 x Per Day/30 Days Prim Dressing: Maxorb Extra Ag+ Alginate Dressing, 2x2 (in/in) (Generic) 1 x Per Day/30 Days ary Discharge Instructions: Apply to wound bed as instructed Secondary Dressing: Woven Gauze Sponge, Non-Sterile 4x4 in (Generic) 1 x Per Day/30  Days Discharge Instructions: Wet with Vashe we to dry. Secondary Dressing: Zetuvit Plus 4x8 in (Generic) 1 x Per Day/30 Days Discharge Instructions: Apply over primary dressing as directed. Secured With: Coban Self-Adherent Wrap 4x5 (in/yd) (Generic) 1 x Per Day/30 Days Discharge Instructions: Secure with Coban as directed. Secured With: [employer], 4.5x3.1 (in/yd) (Generic) 1 x Per Day/30 Days Discharge Instructions: Secure with Kerlix as directed. 08/26/2023: The dorsal foot wound reopened. The patient thinks that it secondary to adhesive, but it honestly looks more like moisture-related breakdown. He has had copious drainage from his heel, such that his dressing and sock were saturated. The heel is unchanged with its usual accumulation of wet callus around the edges and light slough on the surface. I used a curette to debride slough and eschar off of the dorsal foot wound and callus, skin, and slough from the heel wound. In clinic. Using a mixture of topical gentamicin and mupirocin on his wounds with silver alginate and Kerlix and Coban wrap. Although, he is using Keystone topical antibiotic compound (prescription drug) with the aforementioned contact layers and absorbent backing. Follow-up in 2 weeks. Electronic Signature(s) Signed: 08/26/2023 9:22:31 AM By: [name] MD FACS Entered By: [name] on 08/26/2023 06:22:31 -------------------------------------------------------------------------------- HxROS Details Patient Name: Date of Service: [name], [name] 08/26/2023 8:30 A M Medical Record Number: [id number] Patient Account Number: [id number] Date of Birth/Sex: Treating RN: February 06, 1956 (67 y.o. M) Primary Care Provider: Foye [name] Other Clinician: Referring Provider: Treating Provider/Extender: Kandis Cocking in Treatment: 50 Eyes Medical History:

## 2023-08-29 NOTE — Progress Notes (Signed)
Date Acquired: 65 Weeks of Treatment: Open Wound Status: No Wound Recurrence: No Clustered Wound: N/A Clustered Quantity: 5x5x0.8 Measurements L x W x D (cm) 19.635 A (cm) : rea 15.708 Volume (cm) : 9.10% % Reduction  in A rea: 27.30% % Reduction in Volume: Grade 3 Classification: Large Exudate A mount: Purulent Exudate Type: yellow, brown, green Exudate Color: Thickened Wound Margin: Medium (34-66%) Granulation A mount: Pink, Pale, Hyper-granulation Granulation  Quality: Medium (34-66%) Necrotic A mount: Fat Layer (Subcutaneous Tissue): Yes Fat Layer (Subcutaneous Tissue): Yes N/A Exposed Structures: Fascia: No Tendon: No Muscle: No Joint: No Bone: No Small (1-33%) Epithelialization: Debridement - Selective/Open  Wound Debridement - Selective/Open Wound N/A Debridement: Pre-procedure Verification/Time Out 09:00 Taken: Lidocaine 4% Topical Solution Pain Control: Necrotic/Eschar, Callus, Slough Tissue Debrided: Skin/Epidermis Level: 19.62 Debridement A (sq cm):  rea Curette Instrument: Minimum Bleeding: Pressure Hemostasis A chieved: Procedure was tolerated well Debridement Treatment  Response: 5x5x0.8 Post Debridement Measurements L x W x D (cm) 15.708 Post Debridement Volume: (cm) Callus: Yes Periwound Skin  Texture: Maceration: Yes Periwound Skin Moisture: Dry/Scaly: No Erythema: No Periwound Skin Color: Rubor: No No Abnormality Temperature: Debridement Procedures Performed:] [2:No Photos Left, Dorsal Foot Pressure Injury Diabetic Wound/Ulcer of the Lower  Extremity Type II Diabetes, Osteomyelitis, Neuropathy 07/09/2022 49 Open No Yes 2 2.5x1.5x0.1 2.945 0.295 68.80% 68.70% Grade 2 Small Serous amber Flat and Intact None Present (0%) N/A None Present (0%) Fascia: No Tendon: No Muscle: No Joint: No Bone: No  Large (67-100%) 09:00 Lidocaine 4% Topical Solution Necrotic/Eschar, Slough Non-Viable Tissue 2.94 Curette Minimum Pressure Procedure was tolerated well 2.5x1.5x0.1 0.295 Excoriation: No Scarring: No Dry/Scaly: Yes Maceration: No No Abnormality  Debridement] [N/A:N/A N/A N/A N/A N/A N/A N/A N/A N/A N/A N/A N/A N/A N/A N/A N/A N/A N/A N/A N/A N/A N/A N/A N/A N/A N/A N/A N/A N/A N/A N/A N/A N/A N/A N/A N/A N/A N/A N/A N/A] Treatment Notes Electronic Signature(s) Signed: 08/26/2023 9:16:47 AM By: [name] Guess MD FACS Entered By: [name] on 08/26/2023 06:16:47 -------------------------------------------------------------------------------- Multi-Disciplinary Care Plan Details Patient Name: Date of Service: [name], [name] 08/26/2023 8:30 A M Medical Record Number: [id number] Patient Account Number: [id number] Date of Birth/Sex: Treating RN: [name] (67 y.o. [name] Primary Care [name]: [name] Other Clinician: GARLAND, [name] ([id number]) [id number] Page 4 of 9 Referring [name]: Treating [name]/Extender: [name] in Treatment: 50 Multidisciplinary Care Plan reviewed with physician Active Inactive Nutrition Nursing Diagnoses: Imbalanced nutrition Impaired glucose control:  actual or potential Potential for alteratiion in Nutrition/Potential for imbalanced nutrition Goals: Patient/caregiver will maintain therapeutic glucose control Date Initiated: 05/27/2023 Target Resolution Date: 08/19/2023 Goal Status: Active Interventions: Assess HgA1c results as ordered upon admission and as needed Assess patient nutrition upon admission and as needed per policy Provide education on elevated blood sugars and impact on wound healing Treatment Activities: Patient referred to Primary Care Physician for further nutritional evaluation : 05/27/2023 Notes: 8/28- Juven x2/day Wound/Skin Impairment Nursing Diagnoses: Impaired tissue integrity Goals: Patient/caregiver will verbalize understanding of skin care regimen Date Initiated: 10/06/2022 Target Resolution Date: 08/19/2023 Goal Status: Active Ulcer/skin breakdown will have a volume reduction of 30% by week 4 Date Initiated: 09/10/2022 Date Inactivated: 10/06/2022 Target Resolution Date: 10/08/2022 Goal Status: Unmet Unmet Reason: osteo, HBOT Ulcer/skin breakdown will have a volume reduction of 50% by week 8 Date Initiated: 10/06/2022 Date Inactivated: 02/14/2023 Target Resolution Date: 01/14/2023 Unmet Reason: insufficient perfusion Goal Status: Unmet to Left lower extremity Ulcer/skin breakdown  will have a volume reduction of 80% by week 12 Date Initiated: 02/14/2023 Date Inactivated: 03/14/2023 Target Resolution Date: 03/15/2023 Goal Status: Unmet Unmet Reason: osteomyelitis Interventions: Assess ulceration(s) every visit Provide education on ulcer and skin care Treatment Activities: Consult for HBO : 09/10/2022 Skin care regimen initiated : 09/10/2022 Notes: NPWT started 12/27/22 Electronic Signature(s) Signed: 08/29/2023 12:35:20 PM By: [name] Entered By: [name] on 08/26/2023 05:53:01 Pain Assessment Details -------------------------------------------------------------------------------- [name] ([id number]) [id number] Page 5 of 9 Patient Name: Date of Service: [place], [name] 08/26/2023 8:30 A M Medical Record Number: [id number] Patient Account Number: [id number] Date of Birth/Sex: Treating RN: [name] (67 y.o. [name] Primary Care Rondal Vandevelde: [name] Other Clinician: Referring Laureano Hetzer: Treating Jru Pense/Extender: [name] in Treatment: 50 Active Problems Location of Pain Severity and Description of Pain Patient Has Paino No Site Locations Pain Management and Medication Current Pain Management: Electronic Signature(s) Signed: 08/29/2023 12:35:20 PM By: [name] Entered By: [name] on 08/26/2023 05:38:46 -------------------------------------------------------------------------------- Patient/Caregiver Education Details Patient Name: Date of Service: [name] NA LD R. 9/13/2024andnbsp8:30 A M Medical Record Number: [id number] Patient Account Number: [id number] Date of Birth/Gender: Treating RN: [name] (67 y.o. [name] Primary Care Physician: [name] Other Clinician: Referring Physician: Treating Physician/Extender: [name] in Treatment: 50 Education Assessment Education Provided To: Patient Education Topics Provided Wound/Skin Impairment: Methods: Explain/Verbal Responses: State content correctly Electronic Signature(s) Signed: 08/29/2023 12:35:20 PM By: [name] Entered By: [name] on 08/26/2023 05:56:43 [name] ([id number]) [id number] Page 6 of 9 -------------------------------------------------------------------------------- Wound Assessment Details Patient Name: Date of Service: [place], [name] 08/26/2023 8:30 A M Medical Record Number: [id number] Patient Account Number: [id number] Date of Birth/Sex: Treating RN: [birth year] (67 y.o. [name] Primary  Care Faustino Luecke: [name] Other Clinician: Referring Rovena Hearld: Treating Ramey Schiff/Extender: [name] in Treatment: 50 Wound Status Wound Number: 1 Primary Diabetic Wound/Ulcer of the Lower Extremity Etiology: Wound Location: Left Calcaneus Wound Open Wounding Event: Pressure Injury Status: Date Acquired: 03/09/2019 Comorbid Cataracts, Sleep Apnea, Hypertension, Type II Diabetes, Weeks Of Treatment: 50 History: Osteomyelitis, Neuropathy Clustered Wound: No Wound Measurements Length: (cm) 5 Width: (cm) 5 Depth: (cm) 0.8 Area: (cm) 19.635 Volume: (cm) 15.708 % Reduction in Area: 9.1% % Reduction in Volume: 27.3% Epithelialization: Small (1-33%) Tunneling: No Undermining: No Wound Description Classification: Grade 3 Wound Margin: Thickened Exudate Amount: Large Exudate Type: Purulent Exudate Color: yellow, brown, green Foul Odor After Cleansing: No Slough/Fibrino Yes Wound Bed Granulation Amount: Medium (34-66%) Exposed Structure Granulation Quality: Pink, Pale, Hyper-granulation Fascia Exposed: No Necrotic Amount: Medium (34-66%) Fat Layer (Subcutaneous Tissue) Exposed: Yes Necrotic Quality: Adherent Slough Tendon Exposed: No Muscle Exposed: No Joint Exposed: No Bone Exposed: No Periwound Skin Texture Texture Color No Abnormalities Noted: No No Abnormalities Noted: Yes Callus: Yes Temperature / Pain Temperature: No Abnormality Moisture No Abnormalities Noted: No Dry / Scaly: No Maceration: Yes Treatment Notes Wound #1 (Calcaneus) Wound Laterality: Left Cleanser Soap and Water Discharge Instruction: May shower and wash wound with dial antibacterial soap and water prior to dressing change. Vashe 5.8 (oz) Discharge Instruction: Cleanse the wound with Vashe prior to applying a clean dressing using gauze sponges, not tissue or cotton balls. Peri-Wound Care Zinc Oxide Ointment 30g tube Discharge Instruction: Apply Zinc Oxide  to periwound with each dressing change Topical Gentamicin Discharge Instruction: Apply to wound when [facility] not available [name], [name] ([id number]) [phone].pdf Page 7 of  Date Acquired: 65 Weeks of Treatment: Open Wound Status: No Wound Recurrence: No Clustered Wound: N/A Clustered Quantity: 5x5x0.8 Measurements L x W x D (cm) 19.635 A (cm) : rea 15.708 Volume (cm) : 9.10% % Reduction  in A rea: 27.30% % Reduction in Volume: Grade 3 Classification: Large Exudate A mount: Purulent Exudate Type: yellow, brown, green Exudate Color: Thickened Wound Margin: Medium (34-66%) Granulation A mount: Pink, Pale, Hyper-granulation Granulation  Quality: Medium (34-66%) Necrotic A mount: Fat Layer (Subcutaneous Tissue): Yes Fat Layer (Subcutaneous Tissue): Yes N/A Exposed Structures: Fascia: No Tendon: No Muscle: No Joint: No Bone: No Small (1-33%) Epithelialization: Debridement - Selective/Open  Wound Debridement - Selective/Open Wound N/A Debridement: Pre-procedure Verification/Time Out 09:00 Taken: Lidocaine 4% Topical Solution Pain Control: Necrotic/Eschar, Callus, Slough Tissue Debrided: Skin/Epidermis Level: 19.62 Debridement A (sq cm):  rea Curette Instrument: Minimum Bleeding: Pressure Hemostasis A chieved: Procedure was tolerated well Debridement Treatment  Response: 5x5x0.8 Post Debridement Measurements L x W x D (cm) 15.708 Post Debridement Volume: (cm) Callus: Yes Periwound Skin  Texture: Maceration: Yes Periwound Skin Moisture: Dry/Scaly: No Erythema: No Periwound Skin Color: Rubor: No No Abnormality Temperature: Debridement Procedures Performed:] [2:No Photos Left, Dorsal Foot Pressure Injury Diabetic Wound/Ulcer of the Lower  Extremity Type II Diabetes, Osteomyelitis, Neuropathy 07/09/2022 50 Open No Yes 2 2.5x1.5x0.1 2.945 0.295 68.80% 68.70% Grade 2 Small Serous amber Flat and Intact None Present (0%) N/A None Present (0%) Fascia: No Tendon: No Muscle: No Joint: No Bone: No  Large (67-100%) 09:00 Lidocaine 4% Topical Solution Necrotic/Eschar, Slough Non-Viable Tissue 2.94 Curette Minimum Pressure Procedure was tolerated well 2.5x1.5x0.1 0.295 Excoriation: No Scarring: No Dry/Scaly: Yes Maceration: No No Abnormality  Debridement] [N/A:N/A N/A N/A N/A N/A N/A N/A N/A N/A N/A N/A N/A N/A N/A N/A N/A N/A N/A N/A N/A N/A N/A N/A N/A N/A N/A N/A N/A N/A N/A N/A N/A N/A N/A N/A N/A N/A N/A N/A N/A] Treatment Notes Electronic Signature(s) Signed: 08/26/2023 9:16:47 AM By: [name] Guess MD FACS Entered By: [name] on 08/26/2023 06:16:47 -------------------------------------------------------------------------------- Multi-Disciplinary Care Plan Details Patient Name: Date of Service: [name], [name] 08/26/2023 8:30 A M Medical Record Number: [id number] Patient Account Number: [id number] Date of Birth/Sex: Treating RN: [name] (67 y.o. [name] Primary Care [name]: [name] Other Clinician: GARLAND, [name] ([id number]) [id number] Page 4 of 9 Referring [name]: Treating [name]/Extender: [name] in Treatment: 50 Multidisciplinary Care Plan reviewed with physician Active Inactive Nutrition Nursing Diagnoses: Imbalanced nutrition Impaired glucose control:  actual or potential Potential for alteratiion in Nutrition/Potential for imbalanced nutrition Goals: Patient/caregiver will maintain therapeutic glucose control Date Initiated: 05/27/2023 Target Resolution Date: 08/19/2023 Goal Status: Active Interventions: Assess HgA1c results as ordered upon admission and as needed Assess patient nutrition upon admission and as needed per policy Provide education on elevated blood sugars and impact on wound healing Treatment Activities: Patient referred to Primary Care Physician for further nutritional evaluation : 05/27/2023 Notes: 8/28- Juven x2/day Wound/Skin Impairment Nursing Diagnoses: Impaired tissue integrity Goals: Patient/caregiver will verbalize understanding of skin care regimen Date Initiated: 10/06/2022 Target Resolution Date: 08/19/2023 Goal Status: Active Ulcer/skin breakdown will have a volume reduction of 30% by week 4 Date Initiated: 09/10/2022 Date Inactivated: 10/06/2022 Target Resolution Date: 10/08/2022 Goal Status: Unmet Unmet Reason: osteo, HBOT Ulcer/skin breakdown will have a volume reduction of 50% by week 8 Date Initiated: 10/06/2022 Date Inactivated: 02/14/2023 Target Resolution Date: 01/14/2023 Unmet Reason: insufficient perfusion Goal Status: Unmet to Left lower extremity Ulcer/skin breakdown  Date Acquired: 65 Weeks of Treatment: Open Wound Status: No Wound Recurrence: No Clustered Wound: N/A Clustered Quantity: 5x5x0.8 Measurements L x W x D (cm) 19.635 A (cm) : rea 15.708 Volume (cm) : 9.10% % Reduction  in A rea: 27.30% % Reduction in Volume: Grade 3 Classification: Large Exudate A mount: Purulent Exudate Type: yellow, brown, green Exudate Color: Thickened Wound Margin: Medium (34-66%) Granulation A mount: Pink, Pale, Hyper-granulation Granulation  Quality: Medium (34-66%) Necrotic A mount: Fat Layer (Subcutaneous Tissue): Yes Fat Layer (Subcutaneous Tissue): Yes N/A Exposed Structures: Fascia: No Tendon: No Muscle: No Joint: No Bone: No Small (1-33%) Epithelialization: Debridement - Selective/Open  Wound Debridement - Selective/Open Wound N/A Debridement: Pre-procedure Verification/Time Out 09:00 Taken: Lidocaine 4% Topical Solution Pain Control: Necrotic/Eschar, Callus, Slough Tissue Debrided: Skin/Epidermis Level: 19.62 Debridement A (sq cm):  rea Curette Instrument: Minimum Bleeding: Pressure Hemostasis A chieved: Procedure was tolerated well Debridement Treatment  Response: 5x5x0.8 Post Debridement Measurements L x W x D (cm) 15.708 Post Debridement Volume: (cm) Callus: Yes Periwound Skin  Texture: Maceration: Yes Periwound Skin Moisture: Dry/Scaly: No Erythema: No Periwound Skin Color: Rubor: No No Abnormality Temperature: Debridement Procedures Performed:] [2:No Photos Left, Dorsal Foot Pressure Injury Diabetic Wound/Ulcer of the Lower  Extremity Type II Diabetes, Osteomyelitis, Neuropathy 07/09/2022 50 Open No Yes 2 2.5x1.5x0.1 2.945 0.295 68.80% 68.70% Grade 2 Small Serous amber Flat and Intact None Present (0%) N/A None Present (0%) Fascia: No Tendon: No Muscle: No Joint: No Bone: No  Large (67-100%) 09:00 Lidocaine 4% Topical Solution Necrotic/Eschar, Slough Non-Viable Tissue 2.94 Curette Minimum Pressure Procedure was tolerated well 2.5x1.5x0.1 0.295 Excoriation: No Scarring: No Dry/Scaly: Yes Maceration: No No Abnormality  Debridement] [N/A:N/A N/A N/A N/A N/A N/A N/A N/A N/A N/A N/A N/A N/A N/A N/A N/A N/A N/A N/A N/A N/A N/A N/A N/A N/A N/A N/A N/A N/A N/A N/A N/A N/A N/A N/A N/A N/A N/A N/A N/A] Treatment Notes Electronic Signature(s) Signed: 08/26/2023 9:16:47 AM By: [name] Guess MD FACS Entered By: [name] on 08/26/2023 06:16:47 -------------------------------------------------------------------------------- Multi-Disciplinary Care Plan Details Patient Name: Date of Service: [name], [name] 08/26/2023 8:30 A M Medical Record Number: [id number] Patient Account Number: [id number] Date of Birth/Sex: Treating RN: [name] (67 y.o. [name] Primary Care [name]: [name] Other Clinician: GARLAND, [name] ([id number]) [id number] Page 4 of 9 Referring [name]: Treating [name]/Extender: [name] in Treatment: 50 Multidisciplinary Care Plan reviewed with physician Active Inactive Nutrition Nursing Diagnoses: Imbalanced nutrition Impaired glucose control:  actual or potential Potential for alteratiion in Nutrition/Potential for imbalanced nutrition Goals: Patient/caregiver will maintain therapeutic glucose control Date Initiated: 05/27/2023 Target Resolution Date: 08/19/2023 Goal Status: Active Interventions: Assess HgA1c results as ordered upon admission and as needed Assess patient nutrition upon admission and as needed per policy Provide education on elevated blood sugars and impact on wound healing Treatment Activities: Patient referred to Primary Care Physician for further nutritional evaluation : 05/27/2023 Notes: 8/28- Juven x2/day Wound/Skin Impairment Nursing Diagnoses: Impaired tissue integrity Goals: Patient/caregiver will verbalize understanding of skin care regimen Date Initiated: 10/06/2022 Target Resolution Date: 08/19/2023 Goal Status: Active Ulcer/skin breakdown will have a volume reduction of 30% by week 4 Date Initiated: 09/10/2022 Date Inactivated: 10/06/2022 Target Resolution Date: 10/08/2022 Goal Status: Unmet Unmet Reason: osteo, HBOT Ulcer/skin breakdown will have a volume reduction of 50% by week 8 Date Initiated: 10/06/2022 Date Inactivated: 02/14/2023 Target Resolution Date: 01/14/2023 Unmet Reason: insufficient perfusion Goal Status: Unmet to Left lower extremity Ulcer/skin breakdown  Date Acquired: 65 Weeks of Treatment: Open Wound Status: No Wound Recurrence: No Clustered Wound: N/A Clustered Quantity: 5x5x0.8 Measurements L x W x D (cm) 19.635 A (cm) : rea 15.708 Volume (cm) : 9.10% % Reduction  in A rea: 27.30% % Reduction in Volume: Grade 3 Classification: Large Exudate A mount: Purulent Exudate Type: yellow, brown, green Exudate Color: Thickened Wound Margin: Medium (34-66%) Granulation A mount: Pink, Pale, Hyper-granulation Granulation  Quality: Medium (34-66%) Necrotic A mount: Fat Layer (Subcutaneous Tissue): Yes Fat Layer (Subcutaneous Tissue): Yes N/A Exposed Structures: Fascia: No Tendon: No Muscle: No Joint: No Bone: No Small (1-33%) Epithelialization: Debridement - Selective/Open  Wound Debridement - Selective/Open Wound N/A Debridement: Pre-procedure Verification/Time Out 09:00 Taken: Lidocaine 4% Topical Solution Pain Control: Necrotic/Eschar, Callus, Slough Tissue Debrided: Skin/Epidermis Level: 19.62 Debridement A (sq cm):  rea Curette Instrument: Minimum Bleeding: Pressure Hemostasis A chieved: Procedure was tolerated well Debridement Treatment  Response: 5x5x0.8 Post Debridement Measurements L x W x D (cm) 15.708 Post Debridement Volume: (cm) Callus: Yes Periwound Skin  Texture: Maceration: Yes Periwound Skin Moisture: Dry/Scaly: No Erythema: No Periwound Skin Color: Rubor: No No Abnormality Temperature: Debridement Procedures Performed:] [2:No Photos Left, Dorsal Foot Pressure Injury Diabetic Wound/Ulcer of the Lower  Extremity Type II Diabetes, Osteomyelitis, Neuropathy 07/09/2022 50 Open No Yes 2 2.5x1.5x0.1 2.945 0.295 68.80% 68.70% Grade 2 Small Serous amber Flat and Intact None Present (0%) N/A None Present (0%) Fascia: No Tendon: No Muscle: No Joint: No Bone: No  Large (67-100%) 09:00 Lidocaine 4% Topical Solution Necrotic/Eschar, Slough Non-Viable Tissue 2.94 Curette Minimum Pressure Procedure was tolerated well 2.5x1.5x0.1 0.295 Excoriation: No Scarring: No Dry/Scaly: Yes Maceration: No No Abnormality  Debridement] [N/A:N/A N/A N/A N/A N/A N/A N/A N/A N/A N/A N/A N/A N/A N/A N/A N/A N/A N/A N/A N/A N/A N/A N/A N/A N/A N/A N/A N/A N/A N/A N/A N/A N/A N/A N/A N/A N/A N/A N/A N/A] Treatment Notes Electronic Signature(s) Signed: 08/26/2023 9:16:47 AM By: [name] Guess MD FACS Entered By: [name] on 08/26/2023 06:16:47 -------------------------------------------------------------------------------- Multi-Disciplinary Care Plan Details Patient Name: Date of Service: [name], [name] 08/26/2023 8:30 A M Medical Record Number: [id number] Patient Account Number: [id number] Date of Birth/Sex: Treating RN: [name] (67 y.o. [name] Primary Care [name]: [name] Other Clinician: GARLAND, [name] ([id number]) [id number] Page 4 of 9 Referring [name]: Treating [name]/Extender: [name] in Treatment: 50 Multidisciplinary Care Plan reviewed with physician Active Inactive Nutrition Nursing Diagnoses: Imbalanced nutrition Impaired glucose control:  actual or potential Potential for alteratiion in Nutrition/Potential for imbalanced nutrition Goals: Patient/caregiver will maintain therapeutic glucose control Date Initiated: 05/27/2023 Target Resolution Date: 08/19/2023 Goal Status: Active Interventions: Assess HgA1c results as ordered upon admission and as needed Assess patient nutrition upon admission and as needed per policy Provide education on elevated blood sugars and impact on wound healing Treatment Activities: Patient referred to Primary Care Physician for further nutritional evaluation : 05/27/2023 Notes: 8/28- Juven x2/day Wound/Skin Impairment Nursing Diagnoses: Impaired tissue integrity Goals: Patient/caregiver will verbalize understanding of skin care regimen Date Initiated: 10/06/2022 Target Resolution Date: 08/19/2023 Goal Status: Active Ulcer/skin breakdown will have a volume reduction of 30% by week 4 Date Initiated: 09/10/2022 Date Inactivated: 10/06/2022 Target Resolution Date: 10/08/2022 Goal Status: Unmet Unmet Reason: osteo, HBOT Ulcer/skin breakdown will have a volume reduction of 50% by week 8 Date Initiated: 10/06/2022 Date Inactivated: 02/14/2023 Target Resolution Date: 01/14/2023 Unmet Reason: insufficient perfusion Goal Status: Unmet to Left lower extremity Ulcer/skin breakdown

## 2023-09-09 ENCOUNTER — Encounter (HOSPITAL_BASED_OUTPATIENT_CLINIC_OR_DEPARTMENT_OTHER): Payer: Medicare Other | Admitting: General Surgery

## 2023-09-14 ENCOUNTER — Encounter (HOSPITAL_BASED_OUTPATIENT_CLINIC_OR_DEPARTMENT_OTHER): Payer: Medicare Other | Attending: General Surgery | Admitting: General Surgery

## 2023-09-15 ENCOUNTER — Encounter: Payer: Self-pay | Admitting: *Deleted

## 2023-09-15 ENCOUNTER — Telehealth: Payer: Self-pay | Admitting: *Deleted

## 2023-09-15 NOTE — Patient Outreach (Signed)
  Care Coordination   09/15/2023 Name: [name] MRN: [id number] DOB: [birth year]   Care Coordination Outreach Attempts:  An unsuccessful telephone outreach was attempted today to offer the patient information about available care coordination services.  Follow Up Plan:  Additional outreach attempts will be made to offer the patient care coordination information and services.   Encounter Outcome:  No Answer   Care Coordination Interventions:  No, not indicated    Reece Levy, MSW, LCSW Clinical Social Worker [phone]

## 2023-09-19 ENCOUNTER — Telehealth: Payer: Self-pay | Admitting: *Deleted

## 2023-09-19 NOTE — Patient Outreach (Signed)
  Care Coordination   09/19/2023 Name: [name] MRN: [id number] DOB: 11/11/1956   Care Coordination Outreach Attempts:  A second unsuccessful outreach was attempted today to offer the patient with information about available care coordination services.  Follow Up Plan:  Additional outreach attempts will be made to offer the patient care coordination information and services.   Encounter Outcome:  No Answer   Care Coordination Interventions:  No, not indicated    Reece Levy, MSW, LCSW Clinical Social Worker [phone]

## 2023-09-21 ENCOUNTER — Telehealth: Payer: Self-pay | Admitting: *Deleted

## 2023-09-21 NOTE — Progress Notes (Signed)
  Care Coordination Note  09/21/2023 Name: [name] MRN: [id number] DOB: 12-23-1955  [name] is a 67 y.o. year old male who is a primary care patient of [name], MD and is actively engaged with the care management team. I reached out to [name] by phone today to assist with re-scheduling a follow up visit with the Licensed Clinical Social Worker  Follow up plan: Unsuccessful telephone outreach attempt made. A HIPAA compliant phone message was left for the patient providing contact information and requesting a return call.   Burman Nieves, CCMA Care Coordination Care Guide Direct Dial: [phone]

## 2023-09-26 NOTE — Progress Notes (Signed)
  Care Coordination Note  09/26/2023 Name: [name] MRN: [id number] DOB: 08/10/1956  [name] is a 67 y.o. year old male who is a primary care patient of [name], MD and is actively engaged with the care management team. I reached out to [name] by phone today to assist with re-scheduling a follow up visit with the Licensed Clinical Social Worker  Follow up plan: We have been unable to make contact with the patient for follow up.   Burman Nieves, CCMA Care Coordination Care Guide Direct Dial: [phone]

## 2023-10-04 ENCOUNTER — Ambulatory Visit (HOSPITAL_BASED_OUTPATIENT_CLINIC_OR_DEPARTMENT_OTHER): Payer: Medicare Other | Admitting: General Surgery

## 2023-10-28 ENCOUNTER — Encounter (HOSPITAL_BASED_OUTPATIENT_CLINIC_OR_DEPARTMENT_OTHER): Payer: Medicare Other | Attending: General Surgery | Admitting: General Surgery

## 2023-10-28 DIAGNOSIS — E1140 Type 2 diabetes mellitus with diabetic neuropathy, unspecified: Secondary | ICD-10-CM | POA: Diagnosis not present

## 2023-10-28 DIAGNOSIS — I10 Essential (primary) hypertension: Secondary | ICD-10-CM | POA: Diagnosis not present

## 2023-10-28 DIAGNOSIS — E11621 Type 2 diabetes mellitus with foot ulcer: Secondary | ICD-10-CM | POA: Diagnosis present

## 2023-10-28 DIAGNOSIS — M86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, left ankle and foot: Secondary | ICD-10-CM | POA: Diagnosis not present

## 2023-10-28 DIAGNOSIS — L97524 Non-pressure chronic ulcer of other part of left foot with necrosis of bone: Secondary | ICD-10-CM | POA: Insufficient documentation

## 2023-10-28 DIAGNOSIS — Z833 Family history of diabetes mellitus: Secondary | ICD-10-CM | POA: Insufficient documentation

## 2023-10-28 DIAGNOSIS — L97423 Non-pressure chronic ulcer of left heel and midfoot with necrosis of muscle: Secondary | ICD-10-CM | POA: Insufficient documentation

## 2023-10-28 DIAGNOSIS — G4730 Sleep apnea, unspecified: Secondary | ICD-10-CM | POA: Diagnosis not present

## 2023-10-28 NOTE — Progress Notes (Signed)
HADRIEL, KOLLER R ([id number]) [id number] Page 1 of 11 Visit Report for 10/28/2023 Arrival Information Details Patient Name: Date of Service: [place], [name] 10/28/2023 10:30 A M Medical Record Number: [id number] Patient Account Number: [id number] Date of Birth/Sex: Treating RN: December 03, 1956 (67 y.o. [name], [name] Primary Care Jurni Cesaro: Foye [name] Other Clinician: Referring Henlee Donovan: Treating Senie Lanese/Extender: Kandis Cocking in Treatment: 53 Visit Information History Since Last Visit Added or deleted any medications: No Patient Arrived: Ambulatory Any new allergies or adverse reactions: No Arrival Time: 10:40 Had a fall or experienced change in No Accompanied By: self activities of daily living that may affect Transfer Assistance: None risk of falls: Patient Identification Verified: Yes Signs or symptoms of abuse/neglect since last visito No Secondary Verification Process Completed: Yes Hospitalized since last visit: No Patient Requires Transmission-Based Precautions: No Implantable device outside of the clinic excluding No Patient Has Alerts: No cellular tissue based products placed in the center since last visit: Has Dressing in Place as Prescribed: Yes Pain Present Now: No Electronic Signature(s) Signed: 10/28/2023 1:18:47 PM By: Shawn Stall RN, BSN Entered By: Shawn Stall on 10/28/2023 07:46:27 -------------------------------------------------------------------------------- Encounter Discharge Information Details Patient Name: Date of Service: [name], [name] 10/28/2023 10:30 A M Medical Record Number: [id number] Patient Account Number: [id number] Date of Birth/Sex: Treating RN: [birth year] (67 y.o. Dianna Limbo Primary Care Smera Guyette: Foye [name] Other Clinician: Referring Noah Pelaez: Treating Lempi Edwin/Extender: Kandis Cocking in Treatment: 43 Encounter Discharge  Information Items Post Procedure Vitals Discharge Condition: Stable Temperature (F): 98 Ambulatory Status: Ambulatory Pulse (bpm): 94 Discharge Destination: Home Respiratory Rate (breaths/min): 20 Transportation: Private Auto Blood Pressure (mmHg): 149/83 Accompanied By: self Schedule Follow-up Appointment: Yes Clinical Summary of Care: Patient Declined Electronic Signature(s) Signed: 10/28/2023 1:08:41 PM By: Karie Schwalbe RN Entered By: Karie Schwalbe on 10/28/2023 06:23:76 [name] ([id number]) [id number] Page 2 of 11 -------------------------------------------------------------------------------- Lower Extremity Assessment Details Patient Name: Date of Service: [place], [name] 10/28/2023 10:30 A M Medical Record Number: [id number] Patient Account Number: [id number] Date of Birth/Sex: Treating RN: 1956-07-28 (67 y.o. Tammy Sours Primary Care Giliana Vantil: Foye [name] Other Clinician: Referring Savannaha Stonerock: Treating Timberly Yott/Extender: Kandis Cocking in Treatment: 59 Edema Assessment Assessed: Kyra Searles: Yes] [name]: No] Edema: [Left: Ye] [Right: s] Calf Left: Right: Point of Measurement: From Medial Instep 47 cm Ankle Left: Right: Point of Measurement: From Medial Instep 27 cm Vascular Assessment Pulses: Dorsalis Pedis Palpable: [Left:Yes] Extremity colors, hair growth, and conditions: Extremity Color: [Left:Hyperpigmented] Hair Growth on Extremity: [Left:No] Temperature of Extremity: [Left:Warm] Capillary Refill: [Left:< 3 seconds] Dependent Rubor: [Left:No] Blanched when Elevated: [Left:No Yes] Toe Nail Assessment Left: Right: Thick: Yes Discolored: Yes Deformed: Yes Improper Length and Hygiene: No Electronic Signature(s) Signed: 10/28/2023 1:18:47 PM By: Shawn Stall RN, BSN Entered By: Shawn Stall on 10/28/2023  07:47:44 -------------------------------------------------------------------------------- Multi Wound Chart Details Patient Name: Date of Service: [name], [name] 10/28/2023 10:30 A M Medical Record Number: [id number] Patient Account Number: [id number] Date of Birth/Sex: Treating RN: 05-14-1956 (67 y.o. M) Primary Care Delicia Berens: Foye [name] Other Clinician: Referring Amarys Sliwinski: Treating Lequisha Cammack/Extender: Kandis Cocking in Treatment: 74 Vital Signs Height(in): 74 Capillary Blood Glucose(mg/dl): 025 Weight(lbs): 852 Pulse(bpm): [address] Deniz R ([id number]) [id number].pdf Page 3 of 11 Body Mass Index(BMI): 31.5 Blood Pressure(mmHg): 149/83 Temperature(F): 98 Respiratory Rate(breaths/min): 20 [1:Photos:] [N/A:N/A] Left Calcaneus Left, Dorsal Foot N/A Wound Location: Pressure Injury Pressure Injury N/A Wounding Event: Diabetic Wound/Ulcer of the  Lower Diabetic Wound/Ulcer of the Lower N/A Primary Etiology: Extremity Extremity Cataracts, Sleep Apnea, Hypertension, Cataracts, Sleep Apnea, Hypertension, N/A Comorbid History: Type II Diabetes, Osteomyelitis, Type II Diabetes, Osteomyelitis, Neuropathy Neuropathy 03/09/2019 07/09/2022 N/A Date Acquired: 54 59 N/A Weeks of Treatment: Open Open N/A Wound Status: No No N/A Wound Recurrence: No Yes N/A Clustered Wound: N/A 1 N/A Clustered Quantity: 5.4x5x1.2 1.5x1.5x0.1 N/A Measurements L x W x D (cm) 21.206 1.767 N/A A (cm) : rea 25.447 0.177 N/A Volume (cm) : 1.80% 81.30% N/A % Reduction in A rea: -17.80% 81.20% N/A % Reduction in Volume: Grade 3 Grade 2 N/A Classification: Large Small N/A Exudate A mount: Serosanguineous Serosanguineous N/A Exudate Type: red, brown red, brown N/A Exudate Color: Thickened Flat and Intact N/A Wound Margin: Large (67-100%) Large (67-100%) N/A Granulation A mount: Pink, Pale, Hyper-granulation Red, Pink N/A Granulation  Quality: Small (1-33%) Small (1-33%) N/A Necrotic A mount: Adherent Slough Eschar, Adherent Slough N/A Necrotic Tissue: Fat Layer (Subcutaneous Tissue): Yes Fat Layer (Subcutaneous Tissue): Yes N/A Exposed Structures: Fascia: No Fascia: No Tendon: No Tendon: No Muscle: No Muscle: No Joint: No Joint: No Bone: No Bone: No Small (1-33%) Large (67-100%) N/A Epithelialization: Debridement - Excisional Debridement - Excisional N/A Debridement: Pre-procedure Verification/Time Out 11:02 11:02 N/A Taken: Lidocaine 4% Topical Solution Lidocaine 4% Topical Solution N/A Pain Control: Callus, Subcutaneous, Slough Subcutaneous, Slough N/A Tissue Debrided: Skin/Subcutaneous Tissue Skin/Subcutaneous Tissue N/A Level: 21.2 1.77 N/A Debridement A (sq cm): rea Curette Curette N/A Instrument: Minimum Minimum N/A Bleeding: Pressure Pressure N/A Hemostasis A chieved: Procedure was tolerated well Procedure was tolerated well N/A Debridement Treatment Response: 5.4x5x1.2 1.5x1.5x0.1 N/A Post Debridement Measurements L x W x D (cm) 25.447 0.177 N/A Post Debridement Volume: (cm) Callus: Yes Scarring: Yes N/A Periwound Skin Texture: Excoriation: No Excoriation: No Induration: No Induration: No Crepitus: No Callus: No Rash: No Crepitus: No Scarring: No Rash: No Maceration: Yes Maceration: No N/A Periwound Skin Moisture: Dry/Scaly: No Dry/Scaly: No Atrophie Blanche: No Atrophie Blanche: No N/A Periwound Skin Color: Cyanosis: No Cyanosis: No Ecchymosis: No Ecchymosis: No Erythema: No Erythema: No Hemosiderin Staining: No Hemosiderin Staining: No Mottled: No Mottled: No Pallor: No Pallor: No Rubor: No Rubor: No No Abnormality No Abnormality N/A Temperature: Debridement Debridement N/A Procedures Performed: Treatment Notes ORLANDER, BIRDWELL ([id number]) [id number].pdf Page 4 of 11 Wound #1 (Calcaneus) Wound Laterality: Left Cleanser Soap and  Water Discharge Instruction: May shower and wash wound with dial antibacterial soap and water prior to dressing change. Vashe 5.8 (oz) Discharge Instruction: Cleanse the wound with Vashe prior to applying a clean dressing using gauze sponges, not tissue or cotton balls. Peri-Wound Care Zinc Oxide Ointment 30g tube Discharge Instruction: Apply Zinc Oxide to periwound with each dressing change Topical Gentamicin Discharge Instruction: Apply to wound when keystone not available Mupirocin Ointment Discharge Instruction: when keystone not available apply Mupirocin (Bactroban) as instructed keystone antibiotic compound Primary Dressing Maxorb Extra Ag+ Alginate Dressing, 4x4.75 (in/in) Discharge Instruction: Apply to wound bed as instructed Secondary Dressing ABD Pad, 8x10 Discharge Instruction: Apply over primary dressing as directed. Woven Gauze Sponge, Non-Sterile 4x4 in Discharge Instruction: Wet with Vashe we to dry. Secured With [employer] 4x5 (in/yd) Discharge Instruction: Secure with Coban as directed. Kerlix Roll Sterile, 4.5x3.1 (in/yd) Discharge Instruction: Secure with Kerlix as directed. Compression Wrap Compression Stockings Add-Ons Wound #2 (Foot) Wound Laterality: Dorsal, Left Cleanser Soap and Water Discharge Instruction: May shower and wash wound with dial antibacterial soap and water prior to dressing change. Vashe  5.8 (oz) Discharge Instruction: Cleanse the wound with Vashe prior to applying a clean dressing using gauze sponges, not tissue or cotton balls. Peri-Wound Care Topical Gentamicin Discharge Instruction: Apply to wound when keystone not available Mupirocin Ointment Discharge Instruction: when keystone not available apply Mupirocin (Bactroban) as instructed keystone Primary Dressing Maxorb Extra Ag+ Alginate Dressing, 2x2 (in/in) Discharge Instruction: Apply to wound bed as instructed Secondary Dressing Woven Gauze Sponge, Non-Sterile  4x4 in Discharge Instruction: Wet with Vashe we to dry. Zetuvit Plus 4x8 in Discharge Instruction: Apply over primary dressing as directed. Secured With [employer] 4x5 (in/yd) Discharge Instruction: Secure with Coban as directed. [address] 4.5x3.1 (in/yd) MARRION, LESER R ([id number]) [id number] Page 5 of 11 Discharge Instruction: Secure with Kerlix as directed. Compression Wrap Compression Stockings Add-Ons Electronic Signature(s) Signed: 10/28/2023 11:32:53 AM By: [name] MD FACS Entered By: [name] on 10/28/2023 08:32:53 -------------------------------------------------------------------------------- Multi-Disciplinary Care Plan Details Patient Name: Date of Service: [place], [name] 10/28/2023 10:30 A M Medical Record Number: [id number] Patient Account Number: [id number] Date of Birth/Sex: Treating RN: November 23, 1956 (67 y.o. [name], Yvonne Kendall Primary Care Keshonna Valvo: Foye [name] Other Clinician: Referring Calub Tarnow: Treating Mckinzie Saksa/Extender: Kandis Cocking in Treatment: 41 Multidisciplinary Care Plan reviewed with physician Active Inactive Nutrition Nursing Diagnoses: Imbalanced nutrition Impaired glucose control: actual or potential Potential for alteratiion in Nutrition/Potential for imbalanced nutrition Goals: Patient/caregiver will maintain therapeutic glucose control Date Initiated: 05/27/2023 Target Resolution Date: 11/11/2023 Goal Status: Active Interventions: Assess HgA1c results as ordered upon admission and as needed Assess patient nutrition upon admission and as needed per policy Provide education on elevated blood sugars and impact on wound healing Treatment Activities: Patient referred to Primary Care Physician for further nutritional evaluation : 05/27/2023 Notes: 8/28- Juven x2/day Wound/Skin Impairment Nursing Diagnoses: Impaired tissue  integrity Goals: Patient/caregiver will verbalize understanding of skin care regimen Date Initiated: 10/06/2022 Target Resolution Date: 11/11/2023 Goal Status: Active Ulcer/skin breakdown will have a volume reduction of 30% by week 4 Date Initiated: 09/10/2022 Date Inactivated: 10/06/2022 Target Resolution Date: 10/08/2022 Goal Status: Unmet Unmet Reason: osteo, HBOT Ulcer/skin breakdown will have a volume reduction of 50% by week 8 Date Initiated: 10/06/2022 Date Inactivated: 02/14/2023 Target Resolution Date: 01/14/2023 Unmet Reason: insufficient perfusion Goal Status: Unmet to Left lower extremity Ulcer/skin breakdown will have a volume reduction of 80% by week 12 Date Initiated: 02/14/2023 Date Inactivated: 03/14/2023 Target Resolution Date: 03/15/2023 JEFRI, PEZZA ([id number]) [phone].pdf Page 6 of 11 Goal Status: Unmet Unmet Reason: osteomyelitis Interventions: Assess ulceration(s) every visit Provide education on ulcer and skin care Treatment Activities: Consult for HBO : 09/10/2022 Skin care regimen initiated : 09/10/2022 Notes: NPWT started 12/27/22 Electronic Signature(s) Signed: 10/28/2023 1:18:47 PM By: Shawn Stall RN, BSN Entered By: Shawn Stall on 10/28/2023 07:54:31 -------------------------------------------------------------------------------- Pain Assessment Details Patient Name: Date of Service: [name], [name] 10/28/2023 10:30 A M Medical Record Number: [id number] Patient Account Number: [id number] Date of Birth/Sex: Treating RN: 1956/04/24 (67 y.o. [name], Yvonne Kendall Primary Care Kirbie Stodghill: Foye [name] Other Clinician: Referring Crescencio Jozwiak: Treating Antonis Lor/Extender: Kandis Cocking in Treatment: 25 Active Problems Location of Pain Severity and Description of Pain Patient Has Paino No Site Locations Pain Management and Medication Current Pain Management: Medication: No Cold Application: No Rest:  No Massage: No Activity: No T.E.N.S.: No Heat Application: No Leg drop or elevation: No Is the Current Pain Management Adequate: Adequate How does your wound impact your activities of daily livingo Sleep: No Bathing: No  Appetite: No Relationship With Others: No Bladder Continence: No Emotions: No Bowel Continence: No Work: No Toileting: No Drive: No Dressing: No Hobbies: No Electronic Signature(s) MAEL, MILCH R ([id number]) [id number] Page 7 of 11 Signed: 10/28/2023 1:18:47 PM By: Shawn Stall RN, BSN Entered By: Shawn Stall on 10/28/2023 07:47:23 -------------------------------------------------------------------------------- Patient/Caregiver Education Details Patient Name: Date of Service: [name] NA LD R. 11/15/2024andnbsp10:30 A M Medical Record Number: [id number] Patient Account Number: [id number] Date of Birth/Gender: Treating RN: Nov 18, 1956 (67 y.o. Tammy Sours Primary Care Physician: Foye [name] Other Clinician: Referring Physician: Treating Physician/Extender: Kandis Cocking in Treatment: 71 Education Assessment Education Provided To: Patient Education Topics Provided Wound/Skin Impairment: Handouts: Caring for Your Ulcer Methods: Explain/Verbal Responses: Reinforcements needed Electronic Signature(s) Signed: 10/28/2023 1:18:47 PM By: Shawn Stall RN, BSN Entered By: Shawn Stall on 10/28/2023 07:54:41 -------------------------------------------------------------------------------- Wound Assessment Details Patient Name: Date of Service: [name], [name] 10/28/2023 10:30 A M Medical Record Number: [id number] Patient Account Number: [id number] Date of Birth/Sex: Treating RN: 05-29-1956 (67 y.o. Tammy Sours Primary Care Isabeau Mccalla: Foye [name] Other Clinician: Referring Lovis More: Treating Lorelie Biermann/Extender: Kandis Cocking in Treatment: 59 Wound  Status Wound Number: 1 Primary Diabetic Wound/Ulcer of the Lower Extremity Etiology: Wound Location: Left Calcaneus Wound Open Wounding Event: Pressure Injury Status: Date Acquired: 03/09/2019 Comorbid Cataracts, Sleep Apnea, Hypertension, Type II Diabetes, Weeks Of Treatment: 59 History: Osteomyelitis, Neuropathy Clustered Wound: No Photos MALEEK, BOUGHTON ([id number]) [id number] Page 8 of 11 Wound Measurements Length: (cm) 5.4 Width: (cm) 5 Depth: (cm) 1.2 Area: (cm) 21.206 Volume: (cm) 25.447 % Reduction in Area: 1.8% % Reduction in Volume: -17.8% Epithelialization: Small (1-33%) Tunneling: No Undermining: No Wound Description Classification: Grade 3 Wound Margin: Thickened Exudate Amount: Large Exudate Type: Serosanguineous Exudate Color: red, brown Foul Odor After Cleansing: No Slough/Fibrino Yes Wound Bed Granulation Amount: Large (67-100%) Exposed Structure Granulation Quality: Pink, Pale, Hyper-granulation Fascia Exposed: No Necrotic Amount: Small (1-33%) Fat Layer (Subcutaneous Tissue) Exposed: Yes Necrotic Quality: Adherent Slough Tendon Exposed: No Muscle Exposed: No Joint Exposed: No Bone Exposed: No Periwound Skin Texture Texture Color No Abnormalities Noted: No No Abnormalities Noted: No Callus: Yes Atrophie Blanche: No Crepitus: No Cyanosis: No Excoriation: No Ecchymosis: No Induration: No Erythema: No Rash: No Hemosiderin Staining: No Scarring: No Mottled: No Pallor: No Moisture Rubor: No No Abnormalities Noted: No Dry / Scaly: No Temperature / Pain Maceration: Yes Temperature: No Abnormality Treatment Notes Wound #1 (Calcaneus) Wound Laterality: Left Cleanser Soap and Water Discharge Instruction: May shower and wash wound with dial antibacterial soap and water prior to dressing change. Vashe 5.8 (oz) Discharge Instruction: Cleanse the wound with Vashe prior to applying a clean dressing using gauze  sponges, not tissue or cotton balls. Peri-Wound Care Zinc Oxide Ointment 30g tube Discharge Instruction: Apply Zinc Oxide to periwound with each dressing change Topical Gentamicin Discharge Instruction: Apply to wound when keystone not available Mupirocin Ointment Discharge Instruction: when keystone not available apply Mupirocin (Bactroban) as instructed keystone antibiotic compound Primary Dressing Maxorb Extra Ag+ Alginate Dressing, 4x4.75 (in/in) SURYA, OPPEDISANO R ([id number]) [phone].pdf Page 9 of 11 Discharge Instruction: Apply to wound bed as instructed Secondary Dressing ABD Pad, 8x10 Discharge Instruction: Apply over primary dressing as directed. Woven Gauze Sponge, Non-Sterile 4x4 in Discharge Instruction: Wet with Vashe we to dry. Secured With [employer] 4x5 (in/yd) Discharge Instruction: Secure with Coban as directed. Kerlix Roll Sterile, 4.5x3.1 (in/yd) Discharge Instruction: Secure with Kerlix as directed. Compression Wrap Compression Stockings  Add-Ons Electronic Signature(s) Signed: 10/28/2023 1:18:47 PM By: Shawn Stall RN, BSN Entered By: Shawn Stall on 10/28/2023 07:52:02 -------------------------------------------------------------------------------- Wound Assessment Details Patient Name: Date of Service: [name], [name] 10/28/2023 10:30 A M Medical Record Number: [id number] Patient Account Number: [id number] Date of Birth/Sex: Treating RN: 1956/05/16 (67 y.o. [name], [name] Primary Care Ivyrose Hashman: Foye [name] Other Clinician: Referring Annamaria Salah: Treating Tonie Elsey/Extender: Kandis Cocking in Treatment: 59 Wound Status Wound Number: 2 Primary Diabetic Wound/Ulcer of the Lower Extremity Etiology: Wound Location: Left, Dorsal Foot Wound Open Wounding Event: Pressure Injury Status: Date Acquired: 07/09/2022 Comorbid Cataracts, Sleep Apnea, Hypertension, Type II Diabetes, Weeks Of  Treatment: 59 History: Osteomyelitis, Neuropathy Clustered Wound: Yes Photos Wound Measurements Length: (cm) Width: (cm) Depth: (cm) Clustered Quantity: Area: (cm) Volume: (cm) 1.5 % Reduction in Area: 81.3% 1.5 % Reduction in Volume: 81.2% 0.1 Epithelialization: Large (67-100%) 1 Tunneling: No 1.767 Undermining: No 0.177 Wound Description Classification: Grade 2 Wound Margin: Flat and Intact LOGIN, BRUNKEN R ([id number]) Exudate Amount: Small Exudate Type: Serosanguineous Exudate Color: red, brown Foul Odor After Cleansing: No Slough/Fibrino Yes [phone].pdf Page 10 of 11 Wound Bed Granulation Amount: Large (67-100%) Exposed Structure Granulation Quality: Red, Pink Fascia Exposed: No Necrotic Amount: Small (1-33%) Fat Layer (Subcutaneous Tissue) Exposed: Yes Necrotic Quality: Eschar, Adherent Slough Tendon Exposed: No Muscle Exposed: No Joint Exposed: No Bone Exposed: No Periwound Skin Texture Texture Color No Abnormalities Noted: No No Abnormalities Noted: No Callus: No Atrophie Blanche: No Crepitus: No Cyanosis: No Excoriation: No Ecchymosis: No Induration: No Erythema: No Rash: No Hemosiderin Staining: No Scarring: Yes Mottled: No Pallor: No Moisture Rubor: No No Abnormalities Noted: No Dry / Scaly: No Temperature / Pain Maceration: No Temperature: No Abnormality Treatment Notes Wound #2 (Foot) Wound Laterality: Dorsal, Left Cleanser Soap and Water Discharge Instruction: May shower and wash wound with dial antibacterial soap and water prior to dressing change. Vashe 5.8 (oz) Discharge Instruction: Cleanse the wound with Vashe prior to applying a clean dressing using gauze sponges, not tissue or cotton balls. Peri-Wound Care Topical Gentamicin Discharge Instruction: Apply to wound when keystone not available Mupirocin Ointment Discharge Instruction: when keystone not available apply Mupirocin (Bactroban) as  instructed keystone Primary Dressing Maxorb Extra Ag+ Alginate Dressing, 2x2 (in/in) Discharge Instruction: Apply to wound bed as instructed Secondary Dressing Woven Gauze Sponge, Non-Sterile 4x4 in Discharge Instruction: Wet with Vashe we to dry. Zetuvit Plus 4x8 in Discharge Instruction: Apply over primary dressing as directed. Secured With [employer] 4x5 (in/yd) Discharge Instruction: Secure with Coban as directed. Kerlix Roll Sterile, 4.5x3.1 (in/yd) Discharge Instruction: Secure with Kerlix as directed. Compression Wrap Compression Stockings Add-Ons Electronic Signature(s) Signed: 10/28/2023 1:18:47 PM By: Shawn Stall RN, BSN Entered By: Shawn Stall on 10/28/2023 07:53:37 [name] ([id number]) [id number] Page 11 of 11 -------------------------------------------------------------------------------- Vitals Details Patient Name: Date of Service: [place], [name] 10/28/2023 10:30 A M Medical Record Number: [id number] Patient Account Number: [id number] Date of Birth/Sex: Treating RN: 06-Sep-1956 (67 y.o. [name], Yvonne Kendall Primary Care Eliazar Olivar: Foye [name] Other Clinician: Referring Eual Lindstrom: Treating Shaquila Sigman/Extender: Kandis Cocking in Treatment: 60 Vital Signs Time Taken: 10:45 Temperature (F): 98 Height (in): 74 Pulse (bpm): 94 Weight (lbs): 245 Respiratory Rate (breaths/min): 20 Body Mass Index (BMI): 31.5 Blood Pressure (mmHg): 149/83 Capillary Blood Glucose (mg/dl): 093 Reference Range: 80 - 120 mg / dl Electronic Signature(s) Signed: 10/28/2023 1:18:47 PM By: Shawn Stall RN, BSN Entered By: Shawn Stall on 10/28/2023 07:47:16

## 2023-10-28 NOTE — Progress Notes (Signed)
[name] ([id number]) [id number] Page 1 of 14 Visit Report for 10/28/2023 Chief Complaint Document Details Patient Name: Date of Service: [place], [name] 10/28/2023 10:30 A M Medical Record Number: [id number] Patient Account Number: [id number] Date of Birth/Sex: Treating RN: 1956-03-10 (67 y.o. M) Primary Care Provider: Foye [name] Other Clinician: Referring Provider: Treating Provider/Extender: Kandis Cocking in Treatment: 55 Information Obtained from: Patient Chief Complaint Patients presents for treatment of an open diabetic ulcer and evaluation for hyperbaric oxygen therapy Electronic Signature(s) Signed: 10/28/2023 11:33:01 AM By: [name] MD FACS Entered By: [name] on 10/28/2023 08:33:01 -------------------------------------------------------------------------------- Debridement Details Patient Name: Date of Service: [name], [name] 10/28/2023 10:30 A M Medical Record Number: [id number] Patient Account Number: [id number] Date of Birth/Sex: Treating RN: Nov 04, 1956 (67 y.o. [name] Primary Care Provider: Foye [name] Other Clinician: Referring Provider: Treating Provider/Extender: Kandis Cocking in Treatment: 59 Debridement Performed for Assessment: Wound #1 Left Calcaneus Performed By: Physician [name], MD The following information was scribed by: Samuella Bruin The information was scribed for: [name] Debridement Type: Debridement Severity of Tissue Pre Debridement: Fat layer exposed Level of Consciousness (Pre-procedure): Awake and Alert Pre-procedure Verification/Time Out Yes - 11:02 Taken: Start Time: 11:02 Pain Control: Lidocaine 4% Topical Solution Percent of Wound Bed Debrided: 100% T Area Debrided (cm): otal 21.2 Tissue and other material debrided: Non-Viable, Callus, Slough, Subcutaneous, Slough Level:  Skin/Subcutaneous Tissue Debridement Description: Excisional Instrument: Curette Bleeding: Minimum Hemostasis Achieved: Pressure Response to Treatment: Procedure was tolerated well Level of Consciousness (Post- Awake and Alert procedure): Post Debridement Measurements of Total Wound Length: (cm) 5.4 Width: (cm) 5 Depth: (cm) 1.2 Volume: (cm) 25.447 Character of Wound/Ulcer Post Debridement: Improved Severity of Tissue Post Debridement: Fat layer exposed [name] ([id number]) [phone].pdf Page 2 of 14 Post Procedure Diagnosis Same as Pre-procedure Electronic Signature(s) Signed: 10/28/2023 11:57:20 AM By: Samuella Bruin Signed: 10/28/2023 12:51:55 PM By: [name] MD FACS Entered By: Samuella Bruin on 10/28/2023 08:05:20 -------------------------------------------------------------------------------- Debridement Details Patient Name: Date of Service: [name], [name] 10/28/2023 10:30 A M Medical Record Number: [id number] Patient Account Number: [id number] Date of Birth/Sex: Treating RN: August 21, 1956 (67 y.o. [name] Primary Care Provider: Foye [name] Other Clinician: Referring Provider: Treating Provider/Extender: Kandis Cocking in Treatment: 59 Debridement Performed for Assessment: Wound #2 Left,Dorsal Foot Performed By: Physician [name], MD The following information was scribed by: Samuella Bruin The information was scribed for: [name] Debridement Type: Debridement Severity of Tissue Pre Debridement: Fat layer exposed Level of Consciousness (Pre-procedure): Awake and Alert Pre-procedure Verification/Time Out Yes - 11:02 Taken: Start Time: 11:02 Pain Control: Lidocaine 4% T opical Solution Percent of Wound Bed Debrided: 100% T Area Debrided (cm): otal 1.77 Tissue and other material debrided: Non-Viable, Slough, Subcutaneous, Slough Level: Skin/Subcutaneous  Tissue Debridement Description: Excisional Instrument: Curette Bleeding: Minimum Hemostasis Achieved: Pressure Response to Treatment: Procedure was tolerated well Level of Consciousness (Post- Awake and Alert procedure): Post Debridement Measurements of Total Wound Length: (cm) 1.5 Width: (cm) 1.5 Depth: (cm) 0.1 Volume: (cm) 0.177 Character of Wound/Ulcer Post Debridement: Improved Severity of Tissue Post Debridement: Fat layer exposed Post Procedure Diagnosis Same as Pre-procedure Electronic Signature(s) Signed: 10/28/2023 11:57:20 AM By: Samuella Bruin Signed: 10/28/2023 12:51:55 PM By: [name] MD FACS Entered By: Samuella Bruin on 10/28/2023 08:06:36 HPI Details -------------------------------------------------------------------------------- [name] ([id number]) [id number] Page 3 of 14 Patient Name: Date of Service: [name], [name]  [name]. 10/28/2023 10:30 A M Medical Record Number: [id number] Patient Account Number: [id number] Date of Birth/Sex: Treating RN: 05/26/1956 (67 y.o. M) Primary Care Provider: Foye [name] Other Clinician: Referring Provider: Treating Provider/Extender: Kandis Cocking in Treatment: 52 History of Present Illness HPI Description: ADMISSION 09/10/2022 This is a 67 year old poorly controlled type II diabetic (last hemoglobin A1c 10.8%) who has had an ulcer on his heel for over 3 years. He has been seen in multiple wound care centers, including [facility] and [facility]. He reports that at least 3 doctors have recommended that he undergo below-knee amputation. He most recently met with Dr. Reuel Boom, a vascular surgeon affiliated with [facility]. 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 Unfortunately, the patient has some extenuating social circumstances including the fact that he cares  for his wife who has stage IV colon cancer and still works, driving vehicles for [employer]. 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 He was recently hospitalized at [facility] for sepsis and currently has a PICC line through which he receives IV antibiotics. He reports having had another MRI during that hospital stay along with a chest x-ray and EKG. He apparently contacted one of the hyperbaric therapy techs here and asked a number of questions about hyperbaric oxygen treatments. He subsequently self-referred to our center to undergo further evaluation and management. I mention to him that [facility] actually has hyperbaric chambers, but he states that he lives in [place] and this would be more convenient for him given the intensive nature of the therapy and time requirement. ABI in clinic today was 0.94. The patient actually has 2 wounds. There is a wound on the dorsum of his left foot with heavy black eschar and slough present. After debridement, this was demonstrated to involve the muscle and the extensor tendons are exposed. On his heel, there is essentially a "shark bite" type wound, with much of the heel fat pad absent. The muscle layer is exposed. There is blue-green staining around the perimeter of the wound, but no significant odor. He says he has been applying collagen to the wound on his heel and Silvadene and Betadine to the wound on his dorsal foot. 09/20/2022: The heel wound is quite macerated with wet periwound callus. There is slough accumulation on the surface. The dorsal foot wound looks better this week. There is still exposed tendon, but it is fairly clean with just a little biofilm buildup. We are still working on gathering the required documentation to submit for pretreatment review for hyperbaric oxygen therapy. 09/29/2022: The dorsal foot wound continues to improve. I do not see any exposed tendon at this point. There is just some slough  accumulation on the wound surface. He continues to have very wet macerated periwound callus on his heel. There is slough on the surface, but it is loose and thin. There is an area of undermining at the 11 o'clock position, but the overlying skin and subcutaneous tissue is healthy and viable. He has been approved for hyperbaric oxygen therapy and will start treatment tomorrow. 10/06/2022: Continued contraction and improvement of the dorsal foot wound. There is just a little bit of slough accumulation on the wound surface. The periwound callus continues to accumulate on the heel wound and it is quite macerated. It is also persistently found with blue-green staining present. He did initiate his hyperbaric oxygen therapy, but has had significant  difficulty with decompression. He will be going to ENT to have PE tubes placed. 10/18/2022: His hyperbaric oxygen therapy is on hold while his otological issues are being addressed. He came in again today with the periwound skin on both the dorsal aspect of his foot and his calcaneus completely macerated. The blue-green discoloration, however, has abated and the undermining on the calcaneus has improved. The dorsal foot wound has also contracted somewhat. 10/26/2022: The dorsal foot wound continues to contract and fill with good granulation tissue. The heel, once again, has a rim of macerated callus, but the undermining and tunneling continues to contract. He has completed his oral antibiotics and has been using the [facility] topical compounded antibiotic for his dressing changes at home. He is scheduled to see ENT this afternoon. 11/08/2022: The dorsal foot wound is flush with the surrounding skin and has a good granulation tissue surface. There is some slough accumulation. As usual, the heel has a rim of macerated callus but the dimensions are smaller and the tunneling and undermining have contracted further. He has resumed his hyperbaric oxygen therapy. 12/12;  patient seen for wound evaluation. He is tolerating HBO although we could not dive him yesterday because of relative hypoglycemia and the fact he had given himself NovoLog insulin before he came to clinic. Today his blood sugar is in the 180 range she should be fine. He has a large wound on the plantar calcaneus on the right and a more superficial area on the dorsal foot. He is using a scooter for offloading 11/30/2022: The dorsal foot wound continues to contract. There is some slough on the wound surface. The large calcaneus wound has heaped up wet callus around the margins. Continued undermining. 12/08/2022: The dorsal foot wound is flat and flush with the surrounding skin surface. There is some slough present. Once again, the calcaneal wound has thick, absolutely macerated callus hanging off in tatters around the edges. The patient cannot explain to me why this part of his wound gets so wet. There is slough on the surface. The undermined portion of the wound has filled in. 12/17/2022: Earlier this week, he presented for his hyperbaric oxygen therapy and was hypotensive and ill-appearing. He also had what appeared to be an infected sebaceous cyst on his right upper arm. He was sent to the emergency department. He was given a fluid bolus and the ED provider lanced the cyst. He was prescribed doxycycline. He seems to be feeling better today. He notes that he has had copious drainage from his foot. On further questioning, he states that he has not been taking his oral diuretic. The dorsal foot wound has expanded somewhat, but is actually more superficial. The plantar heel wound is about the same. 12/27/2022: The dorsal foot wound has epithelialized further. The plantar foot wound is about the same size but has heaped up macerated callus around the perimeter. The intake nurse noted a tiny fragment of bone on his dressings and there is now an area at about the 5 o'clock position where 1 can palpate  bone through a small slit in the soft tissues. The wound on his arm is contracting but he still likely has cyst wall present due to the manner in which the infected sebaceous cyst was addressed in the ER. 01/01/2023: The wound on his shoulder has contracted considerably. The dorsal foot wound has some slough on the surface but also looks to be improving. The plantar foot wound is basically unchanged. 01/10/2023: The wound on his shoulder  continues to diminish in size and depth. The dorsal foot wound is improving with just a light layer of slough on the surface. The plantar foot wound appears to be contracting but still has thick wet callus around the perimeter. We did in chamber T cPO2 monitoring during his last hyperbaric oxygen therapy. The results show that he has extremely poor tissue perfusion at room air and 1 atm of pressure. He does respond extremely well to hyperbaric oxygen therapy, however. 01/17/2023: The wound on his shoulder is down to 0.6 cm in depth, down from 1 cm last week. The dorsal foot wound is quite a bit smaller this week with just some light slough on the surface. The plantar foot wound is also smaller and the area of bone that was exposed at the posterior heel is now covered. There is still substantial wet callus around the perimeter. He is scheduled to undergo formal vascular studies sometime next week. 01/24/2023: The wound on his shoulder has almost completely filled in, with just a little bit of depth remaining. The dorsal foot wound got macerated; it appears that his home health nurses are applying the drape for his heel wound over the top of this, meaning that the dorsal foot wound is not getting changed as [name], [name] ([id number]) [id number] Page 4 of 14 regularly as it is supposed to. There is some slough on the surface. The plantar foot wound has thick wet callus around the perimeter. The dimensions measure a little bit smaller. He is  having his vascular studies done this afternoon. 02/07/2023: The wound on his shoulder is healed. The dorsal foot wound continues to be subjected to excessive moisture due to the home health nurses applying the drape from his wound VAC over the top of it. It does measure smaller, however. There is more bone exposed on his calcaneus, unfortunately. He has heaped up wet callus around the edges of the heel wound again. He is going to undergo angiography with Dr. Lenell Antu on Friday. Hopefully there will be intervention available to him that may aid in his wound healing. 02/14/2023: He did not get his angiogram last week with Dr. Lenell Antu because he did not have a ride home. The dorsal foot wound looks a little bit better with just a light layer of slough. Unfortunately, there is more bone exposed at the calcaneus and I am concerned that we are nearing a point at which his leg will not be salvageable. 3/18; patient presents for follow-up. He was recently hospitalized on 3/11 for sepsis secondary to diabetic foot wound. He was dishcharged with Augmentin and doxycycline and plan is for 6 weeks of this. He currently has a wound VAC. Wound has declined in appearance. Amputation was recommended in the hospital however patient is not ready for this. Patient states he would like to stop HBO. He currently denies systemic signs of infection. 03/07/2023: The calcaneal wound continues to deteriorate. There is significant undermining around the posterior aspect of the calcaneus and bone is palpable all the way around. The dorsal foot wound has some leathery eschar and slough present. 03/14/2023: The dorsal foot wound is somewhat leathery with some dark eschar and slough present. The calcaneal wound is deteriorating further. There are fragments of the bone hanging loose. He does have an appointment with Dr. Lenell Antu next week on Tuesday to discuss amputation. 04/04/2023: The heel ulcer is larger, but shallower. There is heaped up  with callus around the edges. The dorsal foot wound is stable. He has  appointment with Dr. Lenell Antu was rescheduled for next Tuesday. He is working on arrangements for his dog and his wife to facilitate below-knee amputation. 04/12/2023: The heel ulcer looks about the same. Bone is not exposed. There is moist callus heaped up around the edges. The dorsal foot wound is stable. He will be seeing Dr. Lenell Antu this afternoon to discuss amputation. 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 The dorsal foot wound is a little bit smaller and shallower. It is not as irritated as it often looks. There is slough on the surface. He met with Dr. Lenell Antu and is still not ready to pursue amputation at this time. 05/16/2023: The dorsal foot wound has gotten larger and there appears to be a lot of drainage resulting in periwound tissue maceration. The calcaneal wound also looks similar, with heaped up macerated callus around the edges and a layer of slough on the surface. 05/27/2023: No real change to his wounds. He is still avoiding amputation. 06/10/2023: The dorsal foot wound actually looks a little bit better this week. There is light slough on the surface. On the plantar heel wound, he has less wet callus accumulation. There is rubbery slough on the surface. 06/24/2023: The dorsal foot wound has epithelialized substantially and there are just 2 remaining open areas, both with hypertrophic granulation tissue. The plantar heel wound has deteriorated and there are areas that now have bone easily identified. There is wet callus around the edges, as usual. 07/22/2023: The dorsal foot wound is nearly closed. There is just a tiny residual open area. The plantar heel wound has improved and the bone is covered. As usual, there is heaped up wet callus around the edges. 08/10/2023: The dorsal foot wound has healed. The plantar heel wound  has actually improved with better granulation tissue and actually measures smaller. He has the usual accumulation of wet callus around the edges. 08/26/2023: The dorsal foot wound reopened. The patient thinks that it secondary to adhesive, but it honestly looks more like moisture-related breakdown. He has had copious drainage from his heel, such that his dressing and sock were saturated. The heel is unchanged with its usual accumulation of wet callus around the edges and light slough on the surface. 10/28/2023: It has been 2 months since we last saw him in clinic. In the interim, he has gotten a new CGM and has been doing a much better job of managing his blood sugars. The dorsal foot wound is smaller and more superficial. The plantar heel wound is measuring a little bit bigger, but it has filled in with more solid granulation tissue. The usual wet callus has built up again around the edges. Electronic Signature(s) Signed: 10/28/2023 11:34:18 AM By: [name] MD FACS Entered By: [name] on 10/28/2023 08:34:18 -------------------------------------------------------------------------------- Physical Exam Details Patient Name: Date of Service: [name], [name] 10/28/2023 10:30 A M Medical Record Number: [id number] Patient Account Number: [id number] Date of Birth/Sex: Treating RN: February 10, 1956 (67 y.o. M) Primary Care Provider: Foye [name] Other Clinician: Referring Provider: Treating Provider/Extender: Kandis Cocking in Treatment: 70 Constitutional Slightly hypertensive. . . . no acute distress. Respiratory Normal work of breathing on room air.Marland Kitchen [name], [name] [name] ([id number]) [id number] Page 5 of 14 Notes 10/28/2023: The dorsal foot wound is smaller and more superficial. The plantar heel wound is measuring a little bit bigger, but it has filled in with more  solid granulation tissue. The usual wet callus has built up again  around the edges. Electronic Signature(s) Signed: 10/28/2023 11:35:29 AM By: [name] MD FACS Entered By: [name] on 10/28/2023 08:35:29 -------------------------------------------------------------------------------- Physician Orders Details Patient Name: Date of Service: [name], [name] 10/28/2023 10:30 A M Medical Record Number: [id number] Patient Account Number: [id number] Date of Birth/Sex: Treating RN: May 14, 1956 (67 y.o. 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 - Dr. Lady Gary - room 2 Anesthetic (In clinic) Topical Lidocaine 4% applied to wound bed Bathing/ Shower/ Hygiene May shower and wash wound with soap and water. - use antibacterial soap Edema Control - Orders / Instructions Elevate legs to the level of the heart or above for 30 minutes daily and/or when sitting for 3-4 times a day throughout the day. Avoid standing for long periods of time. Off-Loading Wound #1 Left Calcaneus Other: - use knee scooter to ambulate, minimal weight bearing left foot Wound Treatment Wound #1 - Calcaneus Wound Laterality: Left Cleanser: Soap and Water 1 x Per Day/30 Days Discharge Instructions: May shower and wash wound with dial antibacterial soap and water prior to  dressing change. Cleanser: Vashe 5.8 (oz) 1 x Per Day/30 Days Discharge Instructions: Cleanse the wound with Vashe prior to applying a clean dressing using gauze sponges, not tissue or cotton balls. Peri-Wound Care: Zinc Oxide Ointment 30g tube 1 x Per Day/30 Days Discharge Instructions: Apply Zinc Oxide to periwound with each dressing change Topical: Gentamicin 1 x Per Day/30 Days Discharge Instructions: Apply to wound when keystone not available Topical: Mupirocin Ointment 1 x Per Day/30 Days Discharge Instructions: when keystone not available apply Mupirocin (Bactroban) as instructed Topical: keystone antibiotic compound 1 x Per Day/30 Days [name], [name] ([id number]) [phone].pdf Page 6 of 14 Prim Dressing: Maxorb Extra Ag+ Alginate Dressing, 4x4.75 (in/in) (Generic) 1 x Per Day/30 Days ary Discharge Instructions: Apply to wound bed as instructed Secondary Dressing: ABD Pad, 8x10 (Generic) 1 x Per Day/30 Days Discharge Instructions: Apply over primary dressing as directed. Secondary Dressing: Woven Gauze Sponge, Non-Sterile 4x4 in (Generic) 1 x Per Day/30 Days Discharge Instructions: Wet with Vashe we to dry. Secured With: Coban Self-Adherent Wrap 4x5 (in/yd) (Generic) 1 x Per Day/30 Days Discharge Instructions: Secure with Coban as directed. Secured With: [employer], 4.5x3.1 (in/yd) (Generic) 1 x Per Day/30 Days Discharge Instructions: Secure with Kerlix as directed. Wound #2 - Foot Wound Laterality: Dorsal, Left Cleanser: Soap and Water 1 x Per Day/30 Days Discharge Instructions: May shower and wash wound with dial antibacterial soap and water prior to dressing change. Cleanser: Vashe 5.8 (oz) 1 x Per Day/30 Days Discharge Instructions: Cleanse the wound with Vashe prior to applying a clean dressing using gauze sponges, not tissue or cotton balls. Topical: Gentamicin 1 x Per Day/30 Days Discharge Instructions: Apply to wound when keystone not  available Topical: Mupirocin Ointment 1 x Per Day/30 Days Discharge Instructions: when keystone not  available apply Mupirocin (Bactroban) as instructed Topical: keystone 1 x Per Day/30 Days Prim Dressing: Maxorb Extra Ag+ Alginate Dressing, 2x2 (in/in) (Generic) 1 x Per Day/30 Days ary Discharge Instructions: Apply to wound bed as instructed Secondary Dressing: Woven Gauze Sponge, Non-Sterile 4x4 in (Generic) 1 x Per Day/30 Days Discharge Instructions: Wet with Vashe we to dry. Secondary Dressing: Zetuvit Plus 4x8 in (Generic) 1 x Per Day/30 Days Discharge Instructions: Apply over primary dressing as directed. Secured With: Coban Self-Adherent Wrap 4x5 (in/yd) (Generic) 1 x Per Day/30 Days Discharge Instructions: Secure with Coban as directed. Secured With: [employer], 4.5x3.1 (in/yd) (Generic) 1 x Per Day/30 Days Discharge Instructions: Secure with Kerlix as directed. Patient Medications llergies: bee venom protein (honey bee) A Notifications Medication Indication Start End 10/28/2023 lidocaine DOSE topical 4 % cream - cream topical Electronic Signature(s) Signed: 10/28/2023 12:51:55 PM By: [name] MD FACS Entered By: [name] on 10/28/2023 08:35:46 -------------------------------------------------------------------------------- Problem List Details Patient Name: Date of Service: [name], [name] 10/28/2023 10:30 A M Medical Record Number: [id number] Patient Account Number: [id number] Date of Birth/Sex: Treating RN: 12-Oct-1956 (67 y.o. Tammy Sours Primary Care Provider: Foye [name] Other Clinician: Referring Provider: Treating Provider/Extender: Kandis Cocking in Treatment: 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, left ankle and foot 09/10/2022 No Yes E11.65 Type 2 diabetes mellitus with hyperglycemia 09/10/2022 No Yes Inactive Problems ICD-10 Code Description Active Date Inactive Date L72.3 Sebaceous cyst 12/17/2022 12/17/2022 Resolved Problems ICD-10 Code Description Active Date Resolved Date L98.492 Non-pressure chronic ulcer of skin of other sites with fat layer exposed 12/17/2022 01/03/2023 Electronic Signature(s) Signed: 10/28/2023 11:32:45 AM By: [name] MD FACS Entered By: [name] on 10/28/2023 08:32:45 -------------------------------------------------------------------------------- Progress Note Details Patient Name: Date of Service: [name], [name] 10/28/2023 10:30 A M Medical Record Number: [id number] Patient Account Number: [id number] Date of Birth/Sex: Treating RN: [birth year] (67 y.o. M) Primary Care Provider: Foye [name] Other Clinician: Referring Provider: Treating Provider/Extender: Kandis Cocking in Treatment: 9 Subjective Chief Complaint Information obtained from Patient Patients presents for treatment of an open diabetic ulcer and evaluation for hyperbaric oxygen therapy History of Present Illness (HPI) ADMISSION 09/10/2022 This is a 67 year old poorly controlled type II diabetic (last hemoglobin A1c 10.8%) who has had an ulcer on his heel for over 3 years. He has been seen in [place], [place] [name] ([id number]) [id number] Page 8 of 14 multiple wound care centers, including [facility] and [facility]. He reports that at least 3 doctors have recommended that he undergo below-knee amputation. He most recently met with Dr. Reuel Boom, a vascular surgeon affiliated with [facility]. 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 Unfortunately, the patient has some extenuating social circumstances including the fact that he cares for his wife who has stage IV colon cancer and still works, driving vehicles for [employer]. 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 He was recently hospitalized at [facility] for sepsis and currently has a PICC line through which he receives  IV antibiotics. He reports having had another MRI during that hospital stay along with a chest x-ray and EKG. He apparently contacted one of the hyperbaric therapy techs here and asked a number of questions about hyperbaric oxygen treatments. He subsequently self-referred to our center to undergo further evaluation and management. I mention to him that [facility] actually has hyperbaric chambers, but he states that he lives in [place] and this would be more convenient for him given the intensive nature of the therapy and time requirement. ABI in clinic today was 0.94. The patient actually has 2 wounds. There is a wound on the dorsum of his left foot with heavy black eschar and slough present. After debridement, this was demonstrated to involve the muscle and the extensor tendons are exposed. On his heel, there is essentially a "shark bite" type wound, with much of the heel fat pad absent. The muscle layer is exposed. There is blue-green staining around the perimeter of the wound, but no significant odor. He says he has been applying collagen to the wound on his heel and Silvadene and Betadine to the wound on his dorsal foot. 09/20/2022: The heel wound is quite macerated with wet periwound callus. There is slough accumulation on the surface. The dorsal foot wound looks better this week. There is still exposed tendon, but it is fairly clean with just a little biofilm buildup. We are still working on gathering the  required documentation to submit for pretreatment review for hyperbaric oxygen therapy. 09/29/2022: The dorsal foot wound continues to improve. I do not see any exposed tendon at this point. There is just some slough accumulation on the wound surface. He continues to have very wet macerated periwound callus on his heel. There is slough on the surface, but it is loose and thin. There is an area of undermining at the 11 o'clock position, but the overlying skin and subcutaneous tissue is healthy and viable. He has been approved for hyperbaric oxygen therapy and will start treatment tomorrow. 10/06/2022: Continued contraction and improvement of the dorsal foot wound. There is just a little bit of slough accumulation on the wound surface. The periwound callus continues to accumulate on the heel wound and it is quite macerated. It is also persistently found with blue-green staining present. He did initiate his hyperbaric oxygen therapy, but has had significant difficulty with decompression. He will be going to ENT to have PE tubes placed. 10/18/2022: His hyperbaric oxygen therapy is on hold while his otological issues are being addressed. He came in again today with the periwound skin on both the dorsal aspect of his foot and his calcaneus completely macerated. The blue-green discoloration, however, has abated and the undermining on the calcaneus has improved. The dorsal foot wound has also contracted somewhat. 10/26/2022: The dorsal foot wound continues to contract and fill with good granulation tissue. The heel, once again, has a rim of macerated callus, but the undermining and tunneling continues to contract. He has completed his oral antibiotics and has been using the [facility] topical compounded antibiotic for his dressing changes at home. He is scheduled to see ENT this afternoon. 11/08/2022: The dorsal foot wound is flush with the surrounding skin and has a good granulation tissue surface. There is  some slough accumulation. As usual, the heel has a rim of macerated callus but the dimensions are smaller and the tunneling and undermining have contracted further. He has resumed his hyperbaric oxygen therapy. 12/12; patient seen for wound evaluation. He is tolerating HBO although  we could not dive him yesterday because of relative hypoglycemia and the fact he had given himself NovoLog insulin before he came to clinic. Today his blood sugar is in the 180 range she should be fine. He has a large wound on the plantar calcaneus on the right and a more superficial area on the dorsal foot. He is using a scooter for offloading 11/30/2022: The dorsal foot wound continues to contract. There is some slough on the wound surface. The large calcaneus wound has heaped up wet callus around the margins. Continued undermining. 12/08/2022: The dorsal foot wound is flat and flush with the surrounding skin surface. There is some slough present. Once again, the calcaneal wound has thick, absolutely macerated callus hanging off in tatters around the edges. The patient cannot explain to me why this part of his wound gets so wet. There is slough on the surface. The undermined portion of the wound has filled in. 12/17/2022: Earlier this week, he presented for his hyperbaric oxygen therapy and was hypotensive and ill-appearing. He also had what appeared to be an infected sebaceous cyst on his right upper arm. He was sent to the emergency department. He was given a fluid bolus and the ED provider lanced the cyst. He was prescribed doxycycline. He seems to be feeling better today. He notes that he has had copious drainage from his foot. On further questioning, he states that he has not been taking his oral diuretic. The dorsal foot wound has expanded somewhat, but is actually more superficial. The plantar heel wound is about the same. 12/27/2022: The dorsal foot wound has epithelialized further. The plantar foot wound is about  the same size but has heaped up macerated callus around the perimeter. The intake nurse noted a tiny fragment of bone on his dressings and there is now an area at about the 5 o'clock position where 1 can palpate bone through a small slit in the soft tissues. The wound on his arm is contracting but he still likely has cyst wall present due to the manner in which the infected sebaceous cyst was addressed in the ER. 01/01/2023: The wound on his shoulder has contracted considerably. The dorsal foot wound has some slough on the surface but also looks to be improving. The plantar foot wound is basically unchanged. 01/10/2023: The wound on his shoulder continues to diminish in size and depth. The dorsal foot wound is improving with just a light layer of slough on the surface. The plantar foot wound appears to be contracting but still has thick wet callus around the perimeter. We did in chamber T cPO2 monitoring during his last hyperbaric oxygen therapy. The results show that he has extremely poor tissue perfusion at room air and 1 atm of pressure. He does respond extremely well to hyperbaric oxygen therapy, however. 01/17/2023: The wound on his shoulder is down to 0.6 cm in depth, down from 1 cm last week. The dorsal foot wound is quite a bit smaller this week with just some light slough on the surface. The plantar foot wound is also smaller and the area of bone that was exposed at the posterior heel is now covered. There is still substantial wet callus around the perimeter. He is scheduled to undergo formal vascular studies sometime next week. 01/24/2023: The wound on his shoulder has almost completely filled in, with just a little bit of depth remaining. The dorsal foot wound got macerated; it appears that his home health nurses are applying the drape for his  heel wound over the top of this, meaning that the dorsal foot wound is not getting changed as regularly as it is supposed to. There is some slough on the  surface. The plantar foot wound has thick wet callus around the perimeter. The dimensions measure a little bit smaller. He is having his vascular studies done this afternoon. 02/07/2023: The wound on his shoulder is healed. The dorsal foot wound continues to be subjected to excessive moisture due to the home health nurses applying the drape from his wound VAC over the top of it. It does measure smaller, however. There is more bone exposed on his calcaneus, unfortunately. He has heaped up wet callus around the edges of the heel wound again. He is going to undergo angiography with Dr. Lenell Antu on Friday. Hopefully there will be intervention available to him that may aid in his wound healing. 02/14/2023: He did not get his angiogram last week with Dr. Lenell Antu because he did not have a ride home. The dorsal foot wound looks a little bit better with just a light layer of slough. Unfortunately, there is more bone exposed at the calcaneus and I am concerned that we are nearing a point at which his leg will not be salvageable. 3/18; patient presents for follow-up. He was recently hospitalized on 3/11 for sepsis secondary to diabetic foot wound. He was dishcharged with Augmentin and doxycycline and plan is for 6 weeks of this. He currently has a wound VAC. Wound has declined in appearance. Amputation was recommended in the hospital however patient is not ready for this. Patient states he would like to stop HBO. He currently denies systemic signs of infection. ULICES, SASSE [name] ([id number]) [id number] Page 9 of 14 03/07/2023: The calcaneal wound continues to deteriorate. There is significant undermining around the posterior aspect of the calcaneus and bone is palpable all the way around. The dorsal foot wound has some leathery eschar and slough present. 03/14/2023: The dorsal foot wound is somewhat leathery with some dark eschar and slough present. The calcaneal wound is deteriorating further.  There are fragments of the bone hanging loose. He does have an appointment with Dr. Lenell Antu next week on Tuesday to discuss amputation. 04/04/2023: The heel ulcer is larger, but shallower. There is heaped up with callus around the edges. The dorsal foot wound is stable. He has appointment with Dr. Lenell Antu was rescheduled for next Tuesday. He is working on arrangements for his dog and his wife to facilitate below-knee amputation. 04/12/2023: The heel ulcer looks about the same. Bone is not exposed. There is moist callus heaped up around the edges. The dorsal foot wound is stable. He will be seeing Dr. Lenell Antu this afternoon to discuss amputation. 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 The dorsal foot wound is a little bit smaller and shallower. It is not as irritated as it often looks. There is slough on the surface. He met with Dr. Lenell Antu and is still not ready to pursue amputation at this time. 05/16/2023: The dorsal foot wound has gotten larger and there appears to be a lot of drainage resulting in periwound tissue maceration. The calcaneal wound also looks similar, with heaped up macerated callus around the edges and a layer of slough on the surface. 05/27/2023: No real change to his wounds. He is still avoiding amputation. 06/10/2023: The dorsal foot wound actually looks a little bit better this week. There  is light slough on the surface. On the plantar heel wound, he has less wet callus accumulation. There is rubbery slough on the surface. 06/24/2023: The dorsal foot wound has epithelialized substantially and there are just 2 remaining open areas, both with hypertrophic granulation tissue. The plantar heel wound has deteriorated and there are areas that now have bone easily identified. There is wet callus around the edges, as usual. 07/22/2023: The dorsal foot wound is nearly closed. There is just a tiny  residual open area. The plantar heel wound has improved and the bone is covered. As usual, there is heaped up wet callus around the edges. 08/10/2023: The dorsal foot wound has healed. The plantar heel wound has actually improved with better granulation tissue and actually measures smaller. He has the usual accumulation of wet callus around the edges. 08/26/2023: The dorsal foot wound reopened. The patient thinks that it secondary to adhesive, but it honestly looks more like moisture-related breakdown. He has had copious drainage from his heel, such that his dressing and sock were saturated. The heel is unchanged with its usual accumulation of wet callus around the edges and light slough on the surface. 10/28/2023: It has been 2 months since we last saw him in clinic. In the interim, he has gotten a new CGM and has been doing a much better job of managing his blood sugars. The dorsal foot wound is smaller and more superficial. The plantar heel wound is measuring a little bit bigger, but it has filled in with more solid granulation tissue. The usual wet callus has built up again around the edges. Patient History Family History Cancer - Father,Mother, Diabetes - Mother,Father, Lung Disease - Father, Thyroid Problems - Mother, No family history of Heart Disease, Hereditary Spherocytosis, Hypertension, Kidney Disease, Seizures, Stroke, Tuberculosis. Social History Never smoker, Marital Status - Married, Alcohol Use - Never, Drug Use - No History, Caffeine Use - Daily. Medical History Eyes Patient has history of Cataracts Denies history of Glaucoma, Optic Neuritis Ear/Nose/Mouth/Throat Denies history of Chronic sinus problems/congestion, Middle ear problems Respiratory Patient has history of Sleep Apnea Cardiovascular Patient has history of Hypertension Gastrointestinal Denies history of Cirrhosis , Colitis, Crohns, Hepatitis A, Hepatitis B, Hepatitis C Endocrine Patient has history of Type II  Diabetes Immunological Denies history of Lupus Erythematosus, Raynauds, Scleroderma Musculoskeletal Patient has history of Osteomyelitis - 2023 Neurologic Patient has history of Neuropathy - Bila lower extremities Oncologic Denies history of Received Chemotherapy, Received Radiation Psychiatric Denies history of Anorexia/bulimia, Confinement Anxiety Hospitalization/Surgery History - I and D Left calcaneus. - back surgery- laminectomy. - eye surgery- Bila cataracts. - shoulder arthroscopy. Medical A Surgical History Notes nd Cardiovascular hyperlipidemia Genitourinary AKI [name], [name] ([id number]) [phone].pdf Page 10 of 14 Objective Constitutional Slightly hypertensive. no acute distress. Vitals Time Taken: 10:45 AM, Height: 74 in, Weight: 245 lbs, BMI: 31.5, Temperature: 98 F, Pulse: 94 bpm, Respiratory Rate: 20 breaths/min, Blood Pressure: 149/83 mmHg, Capillary Blood Glucose: 147 mg/dl. Respiratory Normal work of breathing on room air.. General Notes: 10/28/2023: The dorsal foot wound is smaller and more superficial. The plantar heel wound is measuring a little bit bigger, but it has filled in with more solid granulation tissue. The usual wet callus has built up again around the edges. Integumentary (Hair, Skin) Wound #1 status is Open. Original cause of wound was Pressure Injury. The date acquired was: 03/09/2019. The wound has been in treatment 59 weeks. The wound is located on the Left Calcaneus. The wound measures 5.4cm length x  5cm width x 1.2cm depth; 21.206cm^2 area and 25.447cm^3 volume. There is Fat Layer (Subcutaneous Tissue) exposed. There is no tunneling or undermining noted. There is a large amount of serosanguineous drainage noted. The wound margin is thickened. There is large (67-100%) pink, pale, hyper - granulation within the wound bed. There is a small (1-33%) amount of necrotic tissue within the wound bed including Adherent Slough.  The periwound skin appearance exhibited: Callus, Maceration. The periwound skin appearance did not exhibit: Crepitus, Excoriation, Induration, Rash, Scarring, Dry/Scaly, Atrophie Blanche, Cyanosis, Ecchymosis, Hemosiderin Staining, Mottled, Pallor, Rubor, Erythema. Periwound temperature was noted as No Abnormality. Wound #2 status is Open. Original cause of wound was Pressure Injury. The date acquired was: 07/09/2022. The wound has been in treatment 59 weeks. The wound is located on the Left,Dorsal Foot. The wound measures 1.5cm length x 1.5cm width x 0.1cm depth; 1.767cm^2 area and 0.177cm^3 volume. There is Fat Layer (Subcutaneous Tissue) exposed. There is no tunneling or undermining noted. There is a small amount of serosanguineous drainage noted. The wound margin is flat and intact. There is large (67-100%) red, pink granulation within the wound bed. There is a small (1-33%) amount of necrotic tissue within the wound bed including Eschar and Adherent Slough. The periwound skin appearance exhibited: Scarring. The periwound skin appearance did not exhibit: Callus, Crepitus, Excoriation, Induration, Rash, Dry/Scaly, Maceration, Atrophie Blanche, Cyanosis, Ecchymosis, Hemosiderin Staining, Mottled, Pallor, Rubor, Erythema. Periwound temperature was noted as No Abnormality. 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 There was a Excisional Skin/Subcutaneous Tissue Debridement with a total area of 21.2 sq cm performed by [name], MD. With the following instrument(s): Curette to remove Non-Viable tissue/material. Material removed includes Callus, Subcutaneous Tissue, and Slough after achieving pain control using Lidocaine 4% T opical Solution. No specimens were taken. A time out was conducted at 11:02, prior to the start of the procedure. A Minimum amount of bleeding was controlled with Pressure. The procedure was tolerated well. Post Debridement Measurements: 5.4cm length x 5cm width x 1.2cm depth; 25.447cm^3 volume. Character of Wound/Ulcer Post Debridement is improved. Severity of Tissue Post Debridement is: Fat layer exposed. Post procedure Diagnosis Wound #1: Same as Pre-Procedure Wound #2 Pre-procedure diagnosis of Wound #2 is a Diabetic Wound/Ulcer of the Lower Extremity located on the Left,Dorsal Foot .Severity of Tissue Pre Debridement is: Fat layer exposed. There was a Excisional Skin/Subcutaneous Tissue Debridement with a total area of 1.77 sq cm performed by [name], MD. With the following instrument(s): Curette to remove Non-Viable tissue/material. Material removed includes Subcutaneous Tissue and Slough and after achieving pain control using Lidocaine 4% T opical Solution. No specimens were taken. A time out was conducted at 11:02, prior to the start of the procedure. A Minimum amount of bleeding was controlled with Pressure. The procedure was tolerated well. Post Debridement Measurements: 1.5cm length x 1.5cm width x 0.1cm depth; 0.177cm^3 volume. Character of Wound/Ulcer Post Debridement is improved. Severity of Tissue Post Debridement is: Fat layer exposed. Post procedure Diagnosis Wound #2: Same as Pre-Procedure Plan Follow-up Appointments: Return appointment in 1 month. -  Dr. Lady Gary - room 2 Anesthetic: [name], [name] ([id number]) [id number] Page 11 of 14 (In clinic) Topical Lidocaine 4% applied to wound bed Bathing/ Shower/ Hygiene: May shower and wash  wound with soap and water. - use antibacterial soap Edema Control - Orders / Instructions: Elevate legs to the level of the heart or above for 30 minutes daily and/or when sitting for 3-4 times a day throughout the day. Avoid standing for long periods of time. Off-Loading: Wound #1 Left Calcaneus: Other: - use knee scooter to ambulate, minimal weight bearing left foot The following medication(s) was prescribed: lidocaine topical 4 % cream cream topical was prescribed at facility WOUND #1: - Calcaneus Wound Laterality: Left Cleanser: Soap and Water 1 x Per Day/30 Days Discharge Instructions: May shower and wash wound with dial antibacterial soap and water prior to dressing change. Cleanser: Vashe 5.8 (oz) 1 x Per Day/30 Days Discharge Instructions: Cleanse the wound with Vashe prior to applying a clean dressing using gauze sponges, not tissue or cotton balls. Peri-Wound Care: Zinc Oxide Ointment 30g tube 1 x Per Day/30 Days Discharge Instructions: Apply Zinc Oxide to periwound with each dressing change Topical: Gentamicin 1 x Per Day/30 Days Discharge Instructions: Apply to wound when keystone not available Topical: Mupirocin Ointment 1 x Per Day/30 Days Discharge Instructions: when keystone not available apply Mupirocin (Bactroban) as instructed Topical: keystone antibiotic compound 1 x Per Day/30 Days Prim Dressing: Maxorb Extra Ag+ Alginate Dressing, 4x4.75 (in/in) (Generic) 1 x Per Day/30 Days ary Discharge Instructions: Apply to wound bed as instructed Secondary Dressing: ABD Pad, 8x10 (Generic) 1 x Per Day/30 Days Discharge Instructions: Apply over primary dressing as directed. Secondary Dressing: Woven Gauze Sponge, Non-Sterile 4x4 in (Generic) 1 x Per Day/30 Days Discharge Instructions: Wet with Vashe we to dry. Secured With: Coban Self-Adherent Wrap 4x5 (in/yd) (Generic) 1 x Per Day/30 Days Discharge Instructions: Secure with Coban as directed. Secured With: [employer],  4.5x3.1 (in/yd) (Generic) 1 x Per Day/30 Days Discharge Instructions: Secure with Kerlix as directed. WOUND #2: - Foot Wound Laterality: Dorsal, Left Cleanser: Soap and Water 1 x Per Day/30 Days Discharge Instructions: May shower and wash wound with dial antibacterial soap and water prior to dressing change. Cleanser: Vashe 5.8 (oz) 1 x Per Day/30 Days Discharge Instructions: Cleanse the wound with Vashe prior to applying a clean dressing using gauze sponges, not tissue or cotton balls. Topical: Gentamicin 1 x Per Day/30 Days Discharge Instructions: Apply to wound when keystone not available Topical: Mupirocin Ointment 1 x Per Day/30 Days Discharge Instructions: when keystone not available apply Mupirocin (Bactroban) as instructed Topical: keystone 1 x Per Day/30 Days Prim Dressing: Maxorb Extra Ag+ Alginate Dressing, 2x2 (in/in) (Generic) 1 x Per Day/30 Days ary Discharge Instructions: Apply to wound bed as instructed Secondary Dressing: Woven Gauze Sponge, Non-Sterile 4x4 in (Generic) 1 x Per Day/30 Days Discharge Instructions: Wet with Vashe we to dry. Secondary Dressing: Zetuvit Plus 4x8 in (Generic) 1 x Per Day/30 Days Discharge Instructions: Apply over primary dressing as directed. Secured With: Coban Self-Adherent Wrap 4x5 (in/yd) (Generic) 1 x Per Day/30 Days Discharge Instructions: Secure with Coban as directed. Secured With: [employer], 4.5x3.1 (in/yd) (Generic) 1 x Per Day/30 Days Discharge Instructions: Secure with Kerlix as directed. 10/28/2023: It has been 2 months since we last saw him in clinic. In the interim, he has gotten a new CGM and has been doing a much better job of managing his blood sugars. The dorsal foot wound is  smaller and more superficial. The plantar heel wound is measuring a little bit bigger, but it has filled in with more solid granulation tissue. The usual wet callus has built up again around the edges. I used a curette to debride callus, skin,  slough, and subcutaneous tissue from the heel and slough and subcutaneous tissue from the dorsal foot wound. He has been using Keystone topical antibiotic compound (prescription drug) at home, but did not bring it with him to clinic today. We will use topical gentamicin and mupirocin along with silver alginate, Kerlix and Coban. Follow-up in 1 month. Electronic Signature(s) Signed: 10/28/2023 11:36:43 AM By: [name] MD FACS Entered By: [name] on 10/28/2023 08:36:42 -------------------------------------------------------------------------------- HxROS Details Patient Name: Date of Service: [name], [name] 10/28/2023 10:30 A M Medical Record Number: [id number] Patient Account Number: [id number] Date of Birth/Sex: Treating RN: 10/24/1956 (67 y.o. M) Primary Care Provider: Foye [name] Other Clinician: Referring Provider: Treating Provider/Extender: Kandis Cocking in Treatment: [address] [place] [name] ([id number]) [id number] Page 12 of 14 Eyes Medical History: Positive for: Cataracts Negative for: Glaucoma; Optic Neuritis Ear/Nose/Mouth/Throat Medical History: Negative for: Chronic sinus problems/congestion; Middle ear problems Respiratory Medical History: Positive for: Sleep Apnea Cardiovascular Medical History: Positive for: Hypertension Past Medical History Notes: hyperlipidemia Gastrointestinal Medical History: Negative for: Cirrhosis ; Colitis; Crohns; Hepatitis A; Hepatitis B; Hepatitis C Endocrine Medical History: Positive for: Type II Diabetes Time with diabetes: 20 years Treated with: Insulin Blood sugar tested every day: Yes Tested : 2-3 Genitourinary Medical History: Past Medical History Notes: AKI Immunological Medical History: Negative for: Lupus Erythematosus; Raynauds; Scleroderma Musculoskeletal Medical History: Positive for: Osteomyelitis - 2023 Neurologic Medical  History: Positive for: Neuropathy - Bila lower extremities Oncologic Medical History: Negative for: Received Chemotherapy; Received Radiation Psychiatric Medical History: Negative for: Anorexia/bulimia; Confinement Anxiety HBO Extended History Items Eyes: Cataracts Immunizations Pneumococcal Vaccine: Received Pneumococcal Vaccination: Yes Received Pneumococcal Vaccination On or After [address][name], [name] ([id number]) [id number] Page 13 of 14 Implantable Devices Yes Hospitalization / Surgery History Type of Hospitalization/Surgery I and D Left calcaneus back surgery- laminectomy eye surgery- Bila cataracts shoulder arthroscopy Family and Social History Cancer: Yes - Father,Mother; Diabetes: Yes - Mother,Father; Heart Disease: No; Hereditary Spherocytosis: No; Hypertension: No; Kidney Disease: No; Lung Disease: Yes - Father; Seizures: No; Stroke: No; Thyroid Problems: Yes - Mother; Tuberculosis: No; Never smoker; Marital Status - Married; Alcohol Use: Never; Drug Use: No History; Caffeine Use: Daily; Financial Concerns: No; Food, Clothing or Shelter Needs: No; Support System Lacking: No; Transportation Concerns: No Electronic Signature(s) Signed: 10/28/2023 12:51:55 PM By: [name] MD FACS Entered By: [name] on 10/28/2023 08:34:52 -------------------------------------------------------------------------------- SuperBill Details Patient Name: Date of Service: [name], [name] 10/28/2023 Medical Record Number: [id number] Patient Account Number: [id number] Date of Birth/Sex: Treating RN: 1956-07-30 (67 y.o. 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, left ankle and foot E11.65 Type 2 diabetes mellitus with hyperglycemia Facility Procedures : The patient participates with Medicare or their insurance follows the Medicare Facility Guidelines: CPT4 Code Description Modifier Quantity [id number] 11042 - DEB SUBQ TISSUE 20 SQ CM/< 1 ICD-10 Diagnosis Description L97.424  Non-pressure chronic ulcer of  left heel and midfoot with necrosis of bone L97.523 Non-pressure chronic ulcer of other part of left foot with necrosis of muscle : The patient participates with Medicare or their insurance follows the Medicare Facility Guidelines: [id number] 11045 - DEB SUBQ TISS EA ADDL 20CM 1 ICD-10 Diagnosis Description L97.424 Non-pressure chronic ulcer of left heel and midfoot with necrosis of  bone L97.523 Non-pressure chronic ulcer of other part of left foot with necrosis of muscle Physician Procedures : CPT4 Code Description Modifier [id number] - WC PHYS LEVEL 4 - EST PT ICD-10 Diagnosis Description 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 E11.621 Type 2 diabetes mellitus with foot ulcer 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, left ankle and foot SHAKIL, BODNER [name] ([id number]) [id number] 11042 - WC PHYS SUBQ TISS 20 SQ CM 1 ICD-10 Diagnosis  Description 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

## 2023-11-28 ENCOUNTER — Encounter (HOSPITAL_BASED_OUTPATIENT_CLINIC_OR_DEPARTMENT_OTHER): Payer: Medicare Other | Admitting: General Surgery

## 2023-11-29 ENCOUNTER — Encounter (HOSPITAL_BASED_OUTPATIENT_CLINIC_OR_DEPARTMENT_OTHER): Payer: Medicare Other | Admitting: General Surgery

## 2023-12-02 ENCOUNTER — Encounter (HOSPITAL_BASED_OUTPATIENT_CLINIC_OR_DEPARTMENT_OTHER): Payer: Medicare Other | Admitting: General Surgery

## 2023-12-19 ENCOUNTER — Encounter (HOSPITAL_BASED_OUTPATIENT_CLINIC_OR_DEPARTMENT_OTHER): Payer: Medicare (Managed Care) | Attending: General Surgery | Admitting: General Surgery

## 2023-12-19 DIAGNOSIS — M86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, left ankle and foot: Secondary | ICD-10-CM | POA: Diagnosis not present

## 2023-12-19 DIAGNOSIS — L97424 Non-pressure chronic ulcer of left heel and midfoot with necrosis of bone: Secondary | ICD-10-CM | POA: Insufficient documentation

## 2023-12-19 DIAGNOSIS — L97523 Non-pressure chronic ulcer of other part of left foot with necrosis of muscle: Secondary | ICD-10-CM | POA: Insufficient documentation

## 2023-12-19 DIAGNOSIS — E1165 Type 2 diabetes mellitus with hyperglycemia: Secondary | ICD-10-CM | POA: Diagnosis not present

## 2023-12-19 DIAGNOSIS — E11621 Type 2 diabetes mellitus with foot ulcer: Secondary | ICD-10-CM | POA: Diagnosis not present

## 2023-12-19 DIAGNOSIS — E1169 Type 2 diabetes mellitus with other specified complication: Secondary | ICD-10-CM | POA: Diagnosis not present

## 2023-12-19 NOTE — Progress Notes (Signed)
 [name], [name] ([id number]) [id number] Page 1 of 11 Visit Report for 12/19/2023 Chief Complaint Document Details Patient Name: Date of Service: [place], [name] 12/19/2023 10:15 A M Medical Record Number: [id number] Patient Account Number: [id number] Date of Birth/Sex: Treating RN: 17-Mar-1956 (68 y.o. M) Primary Care Provider: Keren Berg Other Clinician: Referring Provider: Treating Provider/Extender: Marolyn Delon Keren Berg Devra in Treatment: 37 Information Obtained from: Patient Chief Complaint Patients presents for treatment of an open diabetic ulcer and evaluation for hyperbaric oxygen  therapy Electronic Signature(s) Signed: 12/19/2023 11:00:48 AM By: Marolyn Delon MD FACS Entered By: Marolyn Delon on 12/19/2023 08:00:48 -------------------------------------------------------------------------------- Debridement Details Patient Name: Date of Service: [name], [name]. 12/19/2023 10:15 A M Medical Record Number: [id number] Patient Account Number: [id number] Date of Birth/Sex: Treating RN: Sep 14, 1956 (68 y.o. NETTY Merleen Handing Primary Care Provider: Keren Berg Other Clinician: Referring Provider: Treating Provider/Extender: Marolyn Delon Keren Berg Devra in Treatment: 66 Debridement Performed for Assessment: Wound #1 Left Calcaneus Performed By: Physician Marolyn Delon, MD The following information was scribed by: Merleen Handing The information was scribed for: Marolyn Delon Debridement Type: Debridement Severity of Tissue Pre Debridement: Necrosis of bone Level of Consciousness (Pre-procedure): Awake and Alert Pre-procedure Verification/Time Out Yes - 10:40 Taken: Start Time: 10:42 Pain Control: Lidocaine  4% T opical Solution Percent of Wound Bed Debrided: 125% T Area Debrided (cm): otal 25.02 Tissue and other material debrided: Viable, Non-Viable, Callus, Slough, Subcutaneous, Skin: Epidermis,  Slough Level: Skin/Subcutaneous Tissue Debridement Description: Excisional Instrument: Curette Bleeding: Minimum Hemostasis Achieved: Pressure Procedural Pain: 0 Post Procedural Pain: 0 Response to Treatment: Procedure was tolerated well Level of Consciousness (Post- Awake and Alert procedure): Post Debridement Measurements of Total Wound Length: (cm) 5.1 Width: (cm) 5 Depth: (cm) 1.5 Volume: (cm) 30.041 [name], [name] ([id number]) [id number] Page 2 of 11 Character of Wound/Ulcer Post Debridement: Improved Severity of Tissue Post Debridement: Necrosis of bone Post Procedure Diagnosis Same as Pre-procedure Electronic Signature(s) Signed: 12/19/2023 12:58:43 PM By: Marolyn Delon MD FACS Signed: 12/19/2023 3:55:02 PM By: Merleen Handing RN, BSN Entered By: Merleen Handing on 12/19/2023 07:47:07 -------------------------------------------------------------------------------- HPI Details Patient Name: Date of Service: [name], [name]. 12/19/2023 10:15 A M Medical Record Number: [id number] Patient Account Number: [id number] Date of Birth/Sex: Treating RN: October 24, 1956 (68 y.o. M) Primary Care Provider: Keren Berg Other Clinician: Referring Provider: Treating Provider/Extender: Marolyn Delon Keren Berg Devra in Treatment: 61 History of Present Illness HPI Description: ADMISSION 09/10/2022 This is a 68 year old poorly controlled type II diabetic (last hemoglobin A1c 10.8%) who has had an ulcer on his heel for over 3 years. He has been seen in multiple wound care centers, including [facility] and [facility]. He reports that at least 3 doctors have recommended that he undergo below-knee amputation. He most recently met with Dr. Adina Fear, a vascular surgeon affiliated with [facility]. 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 Unfortunately, the patient has  some extenuating social circumstances including the fact that he cares for his wife who has stage IV colon cancer and still works, driving vehicles for [employer]. 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 He was recently hospitalized at [facility] for sepsis and currently has a PICC line through which he receives IV antibiotics. He reports having had another MRI during that hospital stay along with a chest x-ray and EKG. He apparently contacted one of the hyperbaric therapy techs here  and asked a number of questions about hyperbaric oxygen  treatments. He subsequently self-referred to our center to undergo further evaluation and management. I mention to him that [facility] actually has hyperbaric chambers, but he states that he lives in [place] and this would be more convenient for him given the intensive nature of the therapy and time requirement. ABI in clinic today was 0.94. The patient actually has 2 wounds. There is a wound on the dorsum of his left foot with heavy black eschar and slough present. After debridement, this was demonstrated to involve the muscle and the extensor tendons are exposed. On his heel, there is essentially a shark bite type wound, with much of the heel fat pad absent. The muscle layer is exposed. There is blue-green staining around the perimeter of the wound, but no significant odor. He says he has been applying collagen to the wound on his heel and Silvadene  and Betadine to the wound on his dorsal foot. 09/20/2022: The heel wound is quite macerated with wet periwound callus. There is slough accumulation on the surface. The dorsal foot wound looks better this week. There is still exposed tendon, but it is fairly clean with just a little biofilm buildup. We are still working on gathering the required documentation to submit for pretreatment review for hyperbaric oxygen  therapy. 09/29/2022: The dorsal foot wound continues to improve. I  do not see any exposed tendon at this point. There is just some slough accumulation on the wound surface. He continues to have very wet macerated periwound callus on his heel. There is slough on the surface, but it is loose and thin. There is an area of undermining at the 11 o'clock position, but the overlying skin and subcutaneous tissue is healthy and viable. He has been approved for hyperbaric oxygen  therapy and will start treatment tomorrow. 10/06/2022: Continued contraction and improvement of the dorsal foot wound. There is just a little bit of slough accumulation on the wound surface. The periwound callus continues to accumulate on the heel wound and it is quite macerated. It is also persistently found with blue-green staining present. He did initiate his hyperbaric oxygen  therapy, but has had significant difficulty with decompression. He will be going to ENT to have PE tubes placed. 10/18/2022: His hyperbaric oxygen  therapy is on hold while his otological issues are being addressed. He came in again today with the periwound skin on both the dorsal aspect of his foot and his calcaneus completely macerated. The blue-green discoloration, however, has abated and the undermining on the calcaneus has improved. The dorsal foot wound has also contracted somewhat. 10/26/2022: The dorsal foot wound continues to contract and fill with good granulation tissue. The heel, once again, has a rim of macerated callus, but the undermining and tunneling continues to contract. He has completed his oral antibiotics and has been using the [facility] topical compounded antibiotic for his dressing changes at home. He is scheduled to see ENT this afternoon. 11/08/2022: The dorsal foot wound is flush with the surrounding skin and has a good granulation tissue surface. There is some slough accumulation. As usual, the heel has a rim of macerated callus but the dimensions are smaller and the tunneling and undermining have  contracted further. He has resumed his hyperbaric oxygen  therapy. 12/12; patient seen for wound evaluation. He is tolerating HBO although we could not dive him yesterday because of relative hypoglycemia and the fact he had given himself NovoLog  insulin  before he came to clinic. Today his blood sugar is  in the 180 range she should be fine. He has a large wound on the plantar calcaneus on the right and a more superficial area on the dorsal foot. He is using a scooter for offloading 11/30/2022: The dorsal foot wound continues to contract. There is some slough on the wound surface. The large calcaneus wound has heaped up wet callus around the margins. Continued undermining. 12/08/2022: The dorsal foot wound is flat and flush with the surrounding skin surface. There is some slough present. Once again, the calcaneal wound has thick, absolutely macerated callus hanging off in tatters around the edges. The patient cannot explain to me why this part of his wound gets so wet. There is DMARIO, RUSSOM ([id number]) [id number] Page 3 of 11 slough on the surface. The undermined portion of the wound has filled in. 12/17/2022: Earlier this week, he presented for his hyperbaric oxygen  therapy and was hypotensive and ill-appearing. He also had what appeared to be an infected sebaceous cyst on his right upper arm. He was sent to the emergency department. He was given a fluid bolus and the ED provider lanced the cyst. He was prescribed doxycycline . He seems to be feeling better today. He notes that he has had copious drainage from his foot. On further questioning, he states that he has not been taking his oral diuretic. The dorsal foot wound has expanded somewhat, but is actually more superficial. The plantar heel wound is about the same. 12/27/2022: The dorsal foot wound has epithelialized further. The plantar foot wound is about the same size but has heaped up macerated callus around  the perimeter. The intake nurse noted a tiny fragment of bone on his dressings and there is now an area at about the 5 o'clock position where 1 can palpate bone through a small slit in the soft tissues. The wound on his arm is contracting but he still likely has cyst wall present due to the manner in which the infected sebaceous cyst was addressed in the ER. 01/01/2023: The wound on his shoulder has contracted considerably. The dorsal foot wound has some slough on the surface but also looks to be improving. The plantar foot wound is basically unchanged. 01/10/2023: The wound on his shoulder continues to diminish in size and depth. The dorsal foot wound is improving with just a light layer of slough on the surface. The plantar foot wound appears to be contracting but still has thick wet callus around the perimeter. We did in chamber T cPO2 monitoring during his last hyperbaric oxygen  therapy. The results show that he has extremely poor tissue perfusion at room air and 1 atm of pressure. He does respond extremely well to hyperbaric oxygen  therapy, however. 01/17/2023: The wound on his shoulder is down to 0.6 cm in depth, down from 1 cm last week. The dorsal foot wound is quite a bit smaller this week with just some light slough on the surface. The plantar foot wound is also smaller and the area of bone that was exposed at the posterior heel is now covered. There is still substantial wet callus around the perimeter. He is scheduled to undergo formal vascular studies sometime next week. 01/24/2023: The wound on his shoulder has almost completely filled in, with just a little bit of depth remaining. The dorsal foot wound got macerated; it appears that his home health nurses are applying the drape for his heel wound over the top of this, meaning that the dorsal foot wound is not getting changed as regularly as  it is supposed to. There is some slough on the surface. The plantar foot wound has thick wet callus  around the perimeter. The dimensions measure a little bit smaller. He is having his vascular studies done this afternoon. 02/07/2023: The wound on his shoulder is healed. The dorsal foot wound continues to be subjected to excessive moisture due to the home health nurses applying the drape from his wound VAC over the top of it. It does measure smaller, however. There is more bone exposed on his calcaneus, unfortunately. He has heaped up wet callus around the edges of the heel wound again. He is going to undergo angiography with Dr. Magda on Friday. Hopefully there will be intervention available to him that may aid in his wound healing. 02/14/2023: He did not get his angiogram last week with Dr. Magda because he did not have a ride home. The dorsal foot wound looks a little bit better with just a light layer of slough. Unfortunately, there is more bone exposed at the calcaneus and I am concerned that we are nearing a point at which his leg will not be salvageable. 3/18; patient presents for follow-up. He was recently hospitalized on 3/11 for sepsis secondary to diabetic foot wound. He was dishcharged with Augmentin  and doxycycline  and plan is for 6 weeks of this. He currently has a wound VAC. Wound has declined in appearance. Amputation was recommended in the hospital however patient is not ready for this. Patient states he would like to stop HBO. He currently denies systemic signs of infection. 03/07/2023: The calcaneal wound continues to deteriorate. There is significant undermining around the posterior aspect of the calcaneus and bone is palpable all the way around. The dorsal foot wound has some leathery eschar and slough present. 03/14/2023: The dorsal foot wound is somewhat leathery with some dark eschar and slough present. The calcaneal wound is deteriorating further. There are fragments of the bone hanging loose. He does have an appointment with Dr. Magda next week on Tuesday to discuss  amputation. 04/04/2023: The heel ulcer is larger, but shallower. There is heaped up with callus around the edges. The dorsal foot wound is stable. He has appointment with Dr. Magda was rescheduled for next Tuesday. He is working on arrangements for his dog and his wife to facilitate below-knee amputation. 04/12/2023: The heel ulcer looks about the same. Bone is not exposed. There is moist callus heaped up around the edges. The dorsal foot wound is stable. He will be seeing Dr. Magda this afternoon to discuss amputation. 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 The dorsal foot wound is a little bit smaller and shallower. It is not as irritated as it often looks. There is slough on the surface. He met with Dr. Magda and is still not ready to pursue amputation at this time. 05/16/2023: The dorsal foot wound has gotten larger and there appears to be a lot of drainage resulting in periwound tissue maceration. The calcaneal wound also looks similar, with heaped up macerated callus around the edges and a layer of slough on the surface. 05/27/2023: No real change to his wounds. He is still avoiding amputation. 06/10/2023: The dorsal foot wound actually looks a little bit better this week. There is light slough on the surface. On the plantar heel wound, he has less wet callus accumulation. There is rubbery slough on the surface. 06/24/2023: The dorsal foot wound  has epithelialized substantially and there are just 2 remaining open areas, both with hypertrophic granulation tissue. The plantar heel wound has deteriorated and there are areas that now have bone easily identified. There is wet callus around the edges, as usual. 07/22/2023: The dorsal foot wound is nearly closed. There is just a tiny residual open area. The plantar heel wound has improved and the bone is covered. As usual, there is heaped up wet callus  around the edges. 08/10/2023: The dorsal foot wound has healed. The plantar heel wound has actually improved with better granulation tissue and actually measures smaller. He has the usual accumulation of wet callus around the edges. 08/26/2023: The dorsal foot wound reopened. The patient thinks that it secondary to adhesive, but it honestly looks more like moisture-related breakdown. He has had copious drainage from his heel, such that his dressing and sock were saturated. The heel is unchanged with its usual accumulation of wet callus around the edges and light slough on the surface. 10/28/2023: It has been 2 months since we last saw him in clinic. In the interim, he has gotten a new CGM and has been doing a much better job of managing his blood sugars. The dorsal foot wound is smaller and more superficial. The plantar heel wound is measuring a little bit bigger, but it has filled in with more solid granulation tissue. The usual wet callus has built up again around the edges. 12/19/2023: The dorsal foot wound is basically epithelialized, but the skin still looks a bit irritated. The plantar heel wound is stable in size with its usual accumulation of heaped up wet callus. He has 2 tunnels, 1 of which probes to bone at the distal lateral aspect of his wound and the other just probes under the skin at the distal medial portion of his wound. Electronic Signature(s) Signed: 12/19/2023 11:02:12 AM By: Marolyn Nest MD FACS [place], Bern [name] ([id number]) [id number] Page 4 of 11 Entered By: Marolyn Nest on 12/19/2023 08:02:12 -------------------------------------------------------------------------------- Physical Exam Details Patient Name: Date of Service: [name], [name] [name] 12/19/2023 10:15 A M Medical Record Number: [id number] Patient Account Number: [id number] Date of Birth/Sex: Treating RN: 13-Aug-1956 (69 y.o. M) Primary Care Provider: Keren Berg Other  Clinician: Referring Provider: Treating Provider/Extender: Marolyn Nest Keren Berg Devra in Treatment: 71 Constitutional Hypertensive, asymptomatic. . . . no acute distress. Respiratory Normal work of breathing on room air.. Notes 12/19/2023: The dorsal foot wound is basically epithelialized, but the skin still looks a bit irritated. The plantar heel wound is stable in size with its usual accumulation of heaped up wet callus. He has 2 tunnels, 1 of which probes to bone at the distal lateral aspect of his wound and the other just probes under the skin at the distal medial portion of his wound. Electronic Signature(s) Signed: 12/19/2023 11:03:17 AM By: Marolyn Nest MD FACS Entered By: Marolyn Nest on 12/19/2023 08:03:17 -------------------------------------------------------------------------------- Physician Orders Details Patient Name: Date of Service: [place], [name]. 12/19/2023 10:15 A M Medical Record Number: [id number] Patient Account Number: [id number] Date of Birth/Sex: Treating RN: [birth year] (68 y.o. 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 - Dr. Marolyn Return A Anesthetic (In clinic) Topical Lidocaine  4% applied to wound bed Bathing/ Shower/ Hygiene May shower and wash  wound with soap and water. - use antibacterial soap [name], [name] ([id number]) [id number] Page 5 of 11 Edema Control - Orders / Instructions Elevate legs to the level of the heart or above for 30 minutes daily and/or when sitting for 3-4 times a day throughout the day. Avoid standing for long periods of time. Off-Loading Wound #1 Left Calcaneus Other: - use knee scooter to ambulate, minimal weight bearing left foot Wound Treatment Wound #1 - Calcaneus Wound Laterality: Left Cleanser: Soap and Water 1 x Per Day/30 Days Discharge Instructions: May shower and wash wound with dial antibacterial soap and water prior to dressing change. Cleanser: Vashe 5.8 (oz) 1 x Per Day/30 Days Discharge Instructions: Cleanse the wound with Vashe prior to applying a clean dressing using gauze sponges, not tissue or cotton balls. Peri-Wound Care: Zinc Oxide Ointment 30g tube 1 x Per Day/30 Days Discharge Instructions: Apply Zinc Oxide to periwound with each dressing change Topical: Gentamicin 1 x Per Day/30 Days Discharge Instructions: Apply to wound when keystone not available Topical: Mupirocin Ointment 1 x Per Day/30 Days Discharge Instructions: when keystone not available apply Mupirocin (Bactroban) as instructed Topical: keystone antibiotic compound 1 x Per Day/30 Days Discharge Instructions: at home Prim Dressing: Aquacel Ag Extra Hydrofiber Dressing w/ Silver , 4x5 (in/in) (DME) (Dispense As Written) 1 x Per Day/30 Days ary Discharge Instructions: apply to wound bed and be sure tuck into tunnel as well Secondary Dressing: ABD Pad, 8x10 (Generic) 1 x Per Day/30 Days Discharge Instructions: Apply over primary dressing as directed. Secondary Dressing: Woven Gauze Sponge, Non-Sterile 4x4 in (Generic) 1 x Per Day/30 Days Discharge Instructions: Wet with Vashe we to dry. Secured With: Coban Self-Adherent Wrap 4x5 (in/yd) (Generic) 1 x Per Day/30 Days Discharge Instructions:  Secure with Coban as directed. Secured With: [employer], 4.5x3.1 (in/yd) (Generic) 1 x Per Day/30 Days Discharge Instructions: Secure with Kerlix as directed. Secured With: Paper Tape, 2x10 (in/yd) (DME) (Generic) 1 x Per Day/30 Days Discharge Instructions: Secure dressing with tape as directed. Add-Ons: Cotton Tipped Applicators, Sterile, 6(in) (DME) (Generic) 1 x Per Day/30 Days Wound #2 - Foot Wound Laterality: Dorsal, Left Cleanser: Soap and Water 1 x Per Day/30 Days Discharge Instructions: May shower and wash wound with dial antibacterial soap and water prior to dressing change. Cleanser: Vashe 5.8 (oz) 1 x Per Day/30 Days Discharge Instructions: Cleanse the wound with Vashe prior to applying a clean dressing using gauze sponges, not tissue or cotton balls. Topical: Gentamicin 1 x Per Day/30 Days Discharge Instructions: Apply to wound when keystone not available Topical: Mupirocin Ointment 1 x Per Day/30 Days Discharge Instructions: when keystone not available apply Mupirocin (Bactroban) as instructed Topical: keystone 1 x Per Day/30 Days Prim Dressing: Aquacel Ag Extra Hydrofiber Dressing w/ Silver , 4x5 (in/in) (DME) (Dispense As Written) 1 x Per Day/30 Days ary Secondary Dressing: Woven Gauze Sponge, Non-Sterile 4x4 in (Generic) 1 x Per Day/30 Days Discharge Instructions: Wet with Vashe we to dry. Secondary Dressing: Zetuvit Plus 4x8 in (Generic) 1 x Per Day/30 Days Discharge Instructions: Apply over primary dressing as directed. Secured With: Coban Self-Adherent Wrap 4x5 (in/yd) (Generic) 1 x Per Day/30  Days Discharge Instructions: Secure with Coban as directed. Secured With: [employer], 4.5x3.1 (in/yd) (Generic) 1 x Per Day/30 Days Discharge Instructions: Secure with Kerlix as directed. [name], [name] ([id number]) [id number] Page 6 of 11 Secured With: Paper Tape, 2x10 (in/yd) (DME) (Generic) 1 x Per Day/30 Days Discharge  Instructions: Secure dressing with tape as directed. Electronic Signature(s) Signed: 12/19/2023 12:58:43 PM By: Marolyn Nest MD FACS Signed: 12/19/2023 3:55:02 PM By: Merleen Handing RN, BSN Entered By: Merleen Handing on 12/19/2023 08:11:08 -------------------------------------------------------------------------------- Problem List Details Patient Name: Date of Service: [place], [name]. 12/19/2023 10:15 A M Medical Record Number: [id number] Patient Account Number: [id number] Date of Birth/Sex: Treating RN: 05-Jul-1956 (68 y.o. 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, left ankle and foot 09/10/2022 No Yes E11.65 Type 2 diabetes mellitus with hyperglycemia 09/10/2022 No Yes Inactive Problems ICD-10 Code Description Active Date Inactive Date L72.3 Sebaceous cyst 12/17/2022 12/17/2022 L97.523 Non-pressure chronic ulcer of other part of left foot with necrosis of muscle 08/26/2023 09/10/2022 Resolved Problems ICD-10 Code Description Active Date Resolved Date L98.492 Non-pressure chronic ulcer of skin of other sites with fat layer exposed 12/17/2022 01/03/2023 Electronic Signature(s) Signed: 12/19/2023 10:58:51 AM By: Marolyn Nest MD FACS Entered By: Marolyn Nest on 12/19/2023 07:58:51 [name] INGLE [name] ([id number]) [id number] Page 7 of 11 -------------------------------------------------------------------------------- Progress Note Details Patient Name: Date of Service: [place], [name] 12/19/2023 10:15 A M Medical Record Number: [id number] Patient  Account Number: [id number] Date of Birth/Sex: Treating RN: [birth year] (68 y.o. M) Primary Care Provider: Keren Berg Other Clinician: Referring Provider: Treating Provider/Extender: Marolyn Nest Keren Berg Devra in Treatment: 58 Subjective Chief Complaint Information obtained from Patient Patients presents for treatment of an open diabetic ulcer and evaluation for hyperbaric oxygen  therapy History of Present Illness (HPI) ADMISSION 09/10/2022 This is a 68 year old poorly controlled type II diabetic (last hemoglobin A1c 10.8%) who has had an ulcer on his heel for over 3 years. He has been seen in multiple wound care centers, including [facility] and [facility]. He reports that at least 3 doctors have recommended that he undergo below-knee amputation. He most recently met with Dr. Adina Fear, a vascular surgeon affiliated with [facility]. 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 Unfortunately, the patient has some extenuating social circumstances including the fact that he cares for his wife who has stage IV colon cancer and still works, driving vehicles for [employer]. 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 He was recently hospitalized at [facility] for sepsis and currently has a PICC line through which he receives IV antibiotics. He reports having had another MRI during that hospital stay along with a chest x-ray and EKG. He apparently contacted one of the hyperbaric therapy techs here and asked a number of questions about hyperbaric oxygen  treatments. He subsequently self-referred to our center to undergo further evaluation and management. I mention to him that [facility] actually has hyperbaric chambers, but he states that he lives in [place] and this would be more convenient for him given the intensive nature of the therapy and  time requirement. ABI  in clinic today was 0.94. The patient actually has 2 wounds. There is a wound on the dorsum of his left foot with heavy black eschar and slough present. After debridement, this was demonstrated to involve the muscle and the extensor tendons are exposed. On his heel, there is essentially a shark bite type wound, with much of the heel fat pad absent. The muscle layer is exposed. There is blue-green staining around the perimeter of the wound, but no significant odor. He says he has been applying collagen to the wound on his heel and Silvadene  and Betadine to the wound on his dorsal foot. 09/20/2022: The heel wound is quite macerated with wet periwound callus. There is slough accumulation on the surface. The dorsal foot wound looks better this week. There is still exposed tendon, but it is fairly clean with just a little biofilm buildup. We are still working on gathering the required documentation to submit for pretreatment review for hyperbaric oxygen  therapy. 09/29/2022: The dorsal foot wound continues to improve. I do not see any exposed tendon at this point. There is just some slough accumulation on the wound surface. He continues to have very wet macerated periwound callus on his heel. There is slough on the surface, but it is loose and thin. There is an area of undermining at the 11 o'clock position, but the overlying skin and subcutaneous tissue is healthy and viable. He has been approved for hyperbaric oxygen  therapy and will start treatment tomorrow. 10/06/2022: Continued contraction and improvement of the dorsal foot wound. There is just a little bit of slough accumulation on the wound surface. The periwound callus continues to accumulate on the heel wound and it is quite macerated. It is also persistently found with blue-green staining present. He did initiate his hyperbaric oxygen  therapy, but has had significant difficulty with decompression. He will be going to ENT to have PE tubes  placed. 10/18/2022: His hyperbaric oxygen  therapy is on hold while his otological issues are being addressed. He came in again today with the periwound skin on both the dorsal aspect of his foot and his calcaneus completely macerated. The blue-green discoloration, however, has abated and the undermining on the calcaneus has improved. The dorsal foot wound has also contracted somewhat. 10/26/2022: The dorsal foot wound continues to contract and fill with good granulation tissue. The heel, once again, has a rim of macerated callus, but the undermining and tunneling continues to contract. He has completed his oral antibiotics and has been using the [facility] topical compounded antibiotic for his dressing changes at home. He is scheduled to see ENT this afternoon. 11/08/2022: The dorsal foot wound is flush with the surrounding skin and has a good granulation tissue surface. There is some slough accumulation. As usual, the heel has a rim of macerated callus but the dimensions are smaller and the tunneling and undermining have contracted further. He has resumed his hyperbaric oxygen  therapy. 12/12; patient seen for wound evaluation. He is tolerating HBO although we could not dive him yesterday because of relative hypoglycemia and the fact he had given himself NovoLog  insulin  before he came to clinic. Today his blood sugar is in the 180 range she should be fine. He has a large wound on the plantar calcaneus on the right and a more superficial area on the dorsal foot. He is using a scooter for offloading 11/30/2022: The dorsal foot wound continues to contract. There is some slough on the wound surface. The large calcaneus wound has  heaped up wet callus around the margins. Continued undermining. 12/08/2022: The dorsal foot wound is flat and flush with the surrounding skin surface. There is some slough present. Once again, the calcaneal wound has thick, absolutely macerated callus hanging off in tatters around  the edges. The patient cannot explain to me why this part of his wound gets so wet. There is slough on the surface. The undermined portion of the wound has filled in. 12/17/2022: Earlier this week, he presented for his hyperbaric oxygen  therapy and was hypotensive and ill-appearing. He also had what appeared to be an infected sebaceous cyst on his right upper arm. He was sent to the emergency department. He was given a fluid bolus and the ED provider lanced the cyst. He was prescribed doxycycline . He seems to be feeling better today. He notes that he has had copious drainage from his foot. On further questioning, he states that he has not been taking his oral diuretic. The dorsal foot wound has expanded somewhat, but is actually more superficial. The plantar heel wound is about the same. 12/27/2022: The dorsal foot wound has epithelialized further. The plantar foot wound is about the same size but has heaped up macerated callus around the perimeter. The intake nurse noted a tiny fragment of bone on his dressings and there is now an area at about the 5 o'clock position where 1 can palpate bone through a small slit in the soft tissues. The wound on his arm is contracting but he still likely has cyst wall present due to the manner in which the infected [name], [name] ([id number]) [id number] Page 8 of 11 sebaceous cyst was addressed in the ER. 01/01/2023: The wound on his shoulder has contracted considerably. The dorsal foot wound has some slough on the surface but also looks to be improving. The plantar foot wound is basically unchanged. 01/10/2023: The wound on his shoulder continues to diminish in size and depth. The dorsal foot wound is improving with just a light layer of slough on the surface. The plantar foot wound appears to be contracting but still has thick wet callus around the perimeter. We did in chamber T cPO2 monitoring during his last hyperbaric oxygen  therapy. The  results show that he has extremely poor tissue perfusion at room air and 1 atm of pressure. He does respond extremely well to hyperbaric oxygen  therapy, however. 01/17/2023: The wound on his shoulder is down to 0.6 cm in depth, down from 1 cm last week. The dorsal foot wound is quite a bit smaller this week with just some light slough on the surface. The plantar foot wound is also smaller and the area of bone that was exposed at the posterior heel is now covered. There is still substantial wet callus around the perimeter. He is scheduled to undergo formal vascular studies sometime next week. 01/24/2023: The wound on his shoulder has almost completely filled in, with just a little bit of depth remaining. The dorsal foot wound got macerated; it appears that his home health nurses are applying the drape for his heel wound over the top of this, meaning that the dorsal foot wound is not getting changed as regularly as it is supposed to. There is some slough on the surface. The plantar foot wound has thick wet callus around the perimeter. The dimensions measure a little bit smaller. He is having his vascular studies done this afternoon. 02/07/2023: The wound on his shoulder is healed. The dorsal foot wound continues to be subjected to excessive  moisture due to the home health nurses applying the drape from his wound VAC over the top of it. It does measure smaller, however. There is more bone exposed on his calcaneus, unfortunately. He has heaped up wet callus around the edges of the heel wound again. He is going to undergo angiography with Dr. Magda on Friday. Hopefully there will be intervention available to him that may aid in his wound healing. 02/14/2023: He did not get his angiogram last week with Dr. Magda because he did not have a ride home. The dorsal foot wound looks a little bit better with just a light layer of slough. Unfortunately, there is more bone exposed at the calcaneus and I am concerned that  we are nearing a point at which his leg will not be salvageable. 3/18; patient presents for follow-up. He was recently hospitalized on 3/11 for sepsis secondary to diabetic foot wound. He was dishcharged with Augmentin  and doxycycline  and plan is for 6 weeks of this. He currently has a wound VAC. Wound has declined in appearance. Amputation was recommended in the hospital however patient is not ready for this. Patient states he would like to stop HBO. He currently denies systemic signs of infection. 03/07/2023: The calcaneal wound continues to deteriorate. There is significant undermining around the posterior aspect of the calcaneus and bone is palpable all the way around. The dorsal foot wound has some leathery eschar and slough present. 03/14/2023: The dorsal foot wound is somewhat leathery with some dark eschar and slough present. The calcaneal wound is deteriorating further. There are fragments of the bone hanging loose. He does have an appointment with Dr. Magda next week on Tuesday to discuss amputation. 04/04/2023: The heel ulcer is larger, but shallower. There is heaped up with callus around the edges. The dorsal foot wound is stable. He has appointment with Dr. Magda was rescheduled for next Tuesday. He is working on arrangements for his dog and his wife to facilitate below-knee amputation. 04/12/2023: The heel ulcer looks about the same. Bone is not exposed. There is moist callus heaped up around the edges. The dorsal foot wound is stable. He will be seeing Dr. Magda this afternoon to discuss amputation. 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 The dorsal foot wound is a little bit smaller and shallower. It is not as irritated as it often looks. There is slough on the surface. He met with Dr. Magda and is still not ready to pursue amputation at this time. 05/16/2023: The dorsal foot wound has  gotten larger and there appears to be a lot of drainage resulting in periwound tissue maceration. The calcaneal wound also looks similar, with heaped up macerated callus around the edges and a layer of slough on the surface. 05/27/2023: No real change to his wounds. He is still avoiding amputation. 06/10/2023: The dorsal foot wound actually looks a little bit better this week. There is light slough on the surface. On the plantar heel wound, he has less wet callus accumulation. There is rubbery slough on the surface. 06/24/2023: The dorsal foot wound has epithelialized substantially and there are just 2 remaining open areas, both with hypertrophic granulation tissue. The plantar heel wound has deteriorated and there are areas that now have bone easily identified. There is wet callus around the edges, as usual. 07/22/2023: The dorsal foot wound is nearly closed. There is just a tiny residual open  area. The plantar heel wound has improved and the bone is covered. As usual, there is heaped up wet callus around the edges. 08/10/2023: The dorsal foot wound has healed. The plantar heel wound has actually improved with better granulation tissue and actually measures smaller. He has the usual accumulation of wet callus around the edges. 08/26/2023: The dorsal foot wound reopened. The patient thinks that it secondary to adhesive, but it honestly looks more like moisture-related breakdown. He has had copious drainage from his heel, such that his dressing and sock were saturated. The heel is unchanged with its usual accumulation of wet callus around the edges and light slough on the surface. 10/28/2023: It has been 2 months since we last saw him in clinic. In the interim, he has gotten a new CGM and has been doing a much better job of managing his blood sugars. The dorsal foot wound is smaller and more superficial. The plantar heel wound is measuring a little bit bigger, but it has filled in with more solid granulation  tissue. The usual wet callus has built up again around the edges. 12/19/2023: The dorsal foot wound is basically epithelialized, but the skin still looks a bit irritated. The plantar heel wound is stable in size with its usual accumulation of heaped up wet callus. He has 2 tunnels, 1 of which probes to bone at the distal lateral aspect of his wound and the other just probes under the skin at the distal medial portion of his wound. Objective Constitutional Hypertensive, asymptomatic. no acute distress. Vitals Time Taken: 10:19 AM, Height: 74 in, Weight: 245 lbs, BMI: 31.5, Temperature: 98.8 F, Pulse: 90 bpm, Respiratory Rate: 20 breaths/min, Blood Pressure: 150/76 mmHg, Capillary Blood Glucose: 189 mg/dl. [name], [name] ([id number]) [id number] Page 9 of 11 General Notes: glucose per pt report this am Respiratory Normal work of breathing on room air.. General Notes: 12/19/2023: The dorsal foot wound is basically epithelialized, but the skin still looks a bit irritated. The plantar heel wound is stable in size with its usual accumulation of heaped up wet callus. He has 2 tunnels, 1 of which probes to bone at the distal lateral aspect of his wound and the other just probes under the skin at the distal medial portion of his wound. Integumentary (Hair, Skin) Wound #1 status is Open. Original cause of wound was Pressure Injury. The date acquired was: 03/09/2019. The wound has been in treatment 66 weeks. The wound is located on the Left Calcaneus. The wound measures 5.1cm length x 5cm width x 1.5cm depth; 20.028cm^2 area and 30.041cm^3 volume. There is bone and Fat Layer (Subcutaneous Tissue) exposed. There is tunneling at 7:00 with a maximum distance of 2cm. There is a large amount of serosanguineous drainage noted. The wound margin is thickened. There is large (67-100%) red, pale, hyper - granulation within the wound bed. There is a small (1-33%) amount of necrotic tissue  within the wound bed including Adherent Slough. The periwound skin appearance exhibited: Callus, Maceration. The periwound skin appearance did not exhibit: Crepitus, Excoriation, Induration, Rash, Scarring, Dry/Scaly, Atrophie Blanche, Cyanosis, Ecchymosis, Hemosiderin Staining, Mottled, Pallor, Rubor, Erythema. Periwound temperature was noted as No Abnormality. Wound #2 status is Open. Original cause of wound was Pressure Injury. The date acquired was: 07/09/2022. The wound has been in treatment 66 weeks. The wound is located on the Left,Dorsal Foot. The wound measures 0.1cm length x 0.1cm width x 0.1cm depth; 0.008cm^2 area and 0.001cm^3 volume. There is Fat Layer (Subcutaneous Tissue) exposed. There  is no tunneling or undermining noted. There is a small amount of serous drainage noted. The wound margin is flat and intact. There is small (1-33%) pink granulation within the wound bed. There is no necrotic tissue within the wound bed. The periwound skin appearance exhibited: Scarring. The periwound skin appearance did not exhibit: Callus, Crepitus, Excoriation, Induration, Rash, Dry/Scaly, Maceration, Atrophie Blanche, Cyanosis, Ecchymosis, Hemosiderin Staining, Mottled, Pallor, Rubor, Erythema. Periwound temperature was noted as No Abnormality. 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 There was a Excisional Skin/Subcutaneous Tissue Debridement with a total area of 25.02 sq cm performed by Marolyn Nest, MD. With the following instrument(s): Curette to remove Viable and Non-Viable tissue/material. Material  removed includes Callus, Subcutaneous Tissue, Slough, and Skin: Epidermis after achieving pain control using Lidocaine  4% Topical Solution. No specimens were taken. A time out was conducted at 10:40, prior to the start of the procedure. A Minimum amount of bleeding was controlled with Pressure. The procedure was tolerated well with a pain level of 0 throughout and a pain level of 0 following the procedure. Post Debridement Measurements: 5.1cm length x 5cm width x 1.5cm depth; 30.041cm^3 volume. Character of Wound/Ulcer Post Debridement is improved. Severity of Tissue Post Debridement is: Necrosis of bone. Post procedure Diagnosis Wound #1: Same as Pre-Procedure Plan Follow-up Appointments: Return Appointment in 2 weeks. - Dr. Marolyn Anesthetic: (In clinic) Topical Lidocaine  4% applied to wound bed Bathing/ Shower/ Hygiene: May shower and wash wound with soap and water. - use antibacterial soap Edema Control - Orders / Instructions: Elevate legs to the level of the heart or above for 30 minutes daily and/or when sitting for 3-4 times a day throughout the day. Avoid standing for long periods of time. Off-Loading: Wound #1 Left Calcaneus: Other: - use knee scooter to ambulate, minimal weight bearing left foot WOUND #1: - Calcaneus Wound Laterality: Left Cleanser: Soap and Water 1 x Per Day/30 Days Discharge Instructions: May shower and wash wound with dial antibacterial soap and water prior to dressing change. Cleanser: Vashe 5.8 (oz) 1 x Per Day/30 Days Discharge Instructions: Cleanse the wound with Vashe prior to applying a clean dressing using gauze sponges, not tissue or cotton balls. Peri-Wound Care: Zinc Oxide Ointment 30g tube 1 x Per Day/30 Days Discharge Instructions: Apply Zinc Oxide to periwound with each dressing change Topical: Gentamicin 1 x Per Day/30 Days Discharge Instructions: Apply to wound when keystone not available Topical: Mupirocin Ointment 1 x Per Day/30  Days Discharge Instructions: when keystone not available apply Mupirocin (Bactroban) as instructed Topical: keystone antibiotic compound 1 x Per Day/30 Days LEONE, [name] ([id number]) [phone].pdf Page 10 of 11 Discharge Instructions: at home Prim Dressing: Aquacel Ag Extra Hydrofiber Dressing w/ Silver , 4x5 (in/in) (DME) (Dispense As Written) 1 x Per Day/30 Days ary Discharge Instructions: apply to wound bed and be sure tuck into tunnel as well Secondary Dressing: ABD Pad, 8x10 (Generic) 1 x Per Day/30 Days Discharge Instructions: Apply over primary dressing as directed. Secondary Dressing: Woven Gauze Sponge, Non-Sterile 4x4 in (Generic) 1 x Per Day/30 Days Discharge Instructions: Wet with Vashe we to dry. Secured With: Coban Self-Adherent Wrap 4x5 (in/yd) (Generic)  1 x Per Day/30 Days Discharge Instructions: Secure with Coban as directed. Secured With: [employer], 4.5x3.1 (in/yd) (Generic) 1 x Per Day/30 Days Discharge Instructions: Secure with Kerlix as directed. Secured With: Paper T ape, 2x10 (in/yd) (DME) (Generic) 1 x Per Day/30 Days Discharge Instructions: Secure dressing with tape as directed. Add-Ons: Cotton Tipped Applicators, Sterile, 6(in) (DME) (Generic) 1 x Per Day/30 Days WOUND #2: - Foot Wound Laterality: Dorsal, Left Cleanser: Soap and Water 1 x Per Day/30 Days Discharge Instructions: May shower and wash wound with dial antibacterial soap and water prior to dressing change. Cleanser: Vashe 5.8 (oz) 1 x Per Day/30 Days Discharge Instructions: Cleanse the wound with Vashe prior to applying a clean dressing using gauze sponges, not tissue or cotton balls. Topical: Gentamicin 1 x Per Day/30 Days Discharge Instructions: Apply to wound when keystone not available Topical: Mupirocin Ointment 1 x Per Day/30 Days Discharge Instructions: when keystone not available apply Mupirocin (Bactroban) as instructed Topical: keystone 1 x Per Day/30  Days Prim Dressing: Aquacel Ag Extra Hydrofiber Dressing w/ Silver , 4x5 (in/in) (DME) (Dispense As Written) 1 x Per Day/30 Days ary Secondary Dressing: Woven Gauze Sponge, Non-Sterile 4x4 in (Generic) 1 x Per Day/30 Days Discharge Instructions: Wet with Vashe we to dry. Secondary Dressing: Zetuvit Plus 4x8 in (Generic) 1 x Per Day/30 Days Discharge Instructions: Apply over primary dressing as directed. Secured With: Coban Self-Adherent Wrap 4x5 (in/yd) (Generic) 1 x Per Day/30 Days Discharge Instructions: Secure with Coban as directed. Secured With: [employer], 4.5x3.1 (in/yd) (Generic) 1 x Per Day/30 Days Discharge Instructions: Secure with Kerlix as directed. Secured With: Paper T ape, 2x10 (in/yd) (DME) (Generic) 1 x Per Day/30 Days Discharge Instructions: Secure dressing with tape as directed. 12/19/2023: The dorsal foot wound is basically epithelialized, but the skin still looks a bit irritated. The plantar heel wound is stable in size with its usual accumulation of heaped up wet callus. He has 2 tunnels, 1 of which probes to bone at the distal lateral aspect of his wound and the other just probes under the skin at the distal medial portion of his wound. He is getting better control of his blood sugar. His most recent hemoglobin A1c was 8.1, down from 11. He says he is managing a lot of this by cooking more at home and eating more vegetables and lean proteins. I used a curette to debride callus, skin, slough, and subcutaneous tissue from the heel wound. We are using topical gentamicin and mupirocin in clinic and he is using a Keystone topical antibiotic compound at home. Silver  alginate for the contact layer. We will need to make sure that it is getting packed into the 2 tunnels. He will follow-up in 1 month. Electronic Signature(s) Signed: 12/19/2023 12:58:43 PM By: Marolyn Nest MD FACS Signed: 12/19/2023 3:55:02 PM By: Merleen Handing RN, BSN Previous Signature: 12/19/2023  11:06:19 AM Version By: Marolyn Nest MD FACS Entered By: Merleen Handing on 12/19/2023 08:11:30 -------------------------------------------------------------------------------- SuperBill Details Patient Name: Date of Service: [name], [name]. 12/19/2023 Medical Record Number: [id number] Patient Account Number: [id number] Date of Birth/Sex: Treating RN: 06/08/1956 (68 y.o. 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.621 Type 2 diabetes mellitus with foot ulcer [name], [name] ([id number]) [phone].pdf Page 11 of [phone] Other chronic osteomyelitis, left ankle  and foot E11.65 Type 2 diabetes mellitus with hyperglycemia Facility Procedures : The patient participates with Medicare or their insurance follows the Medicare Facility Guidelines: CPT4 Code Description Modifier Quantity [id number] 11042 - DEB SUBQ TISSUE 20 SQ CM/< 1 ICD-10 Diagnosis Description L97.424 Non-pressure chronic ulcer of  left heel and midfoot with necrosis of bone E11.621 Type 2 diabetes mellitus with foot ulcer 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, left ankle and foot E11.65 Type 2 diabetes mellitus with hyperglycemia : The patient participates with Medicare or their insurance follows the Medicare Facility Guidelines: [id number] 11045 - DEB SUBQ TISS EA ADDL 20CM 1 ICD-10 Diagnosis Description L97.424 Non-pressure chronic ulcer of left heel and midfoot with necrosis of  bone E11.621 Type 2 diabetes mellitus with foot ulcer 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, left ankle and foot E11.65 Type 2 diabetes mellitus with hyperglycemia Physician Procedures : CPT4 Code Description Modifier [id number] 11042 - WC PHYS SUBQ TISS 20 SQ CM ICD-10 Diagnosis  Description L97.424 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.621 Type 2 diabetes mellitus with foot ulcer M86.672 Other  chronic osteomyelitis, left ankle and foot E11.65 Type 2 diabetes mellitus with hyperglycemia Quantity: 1 : [id number] 11045 - WC PHYS SUBQ TISS EA ADDL 20 CM ICD-10 Diagnosis Description L97.424 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

## 2023-12-19 NOTE — Progress Notes (Signed)
 [name] [name], [name] [name] ([id number]) [id number] Page 1 of 10 Visit Report for 12/19/2023 Arrival Information Details Patient Name: Date of Service: [name] [name]. 12/19/2023 10:15 A M Medical Record Number: [id number] Patient Account Number: [id number] Date of Birth/Sex: Treating RN: Apr 10, 1956 (68 y.o. M) Primary Care Lynisha Osuch: Keren Berg Other Clinician: Referring Elizandro Laura: Treating Jil Penland/Extender: Marolyn Delon Keren Berg Devra in Treatment: 51 Visit Information History Since Last Visit Added or deleted any medications: No Patient Arrived: Ambulatory Any new allergies or adverse reactions: No Arrival Time: 10:18 Had a fall or experienced change in No Accompanied By: self activities of daily living that may affect Transfer Assistance: None risk of falls: Patient Identification Verified: Yes Signs or symptoms of abuse/neglect since last visito No Secondary Verification Process Completed: Yes Hospitalized since last visit: No Patient Requires Transmission-Based Precautions: No Implantable device outside of the clinic excluding No Patient Has Alerts: No cellular tissue based products placed in the center since last visit: Has Dressing in Place as Prescribed: Yes Pain Present Now: No Electronic Signature(s) Signed: 12/19/2023 3:55:02 PM By: Merleen Handing RN, BSN Entered By: Merleen Handing on 12/19/2023 07:22:57 -------------------------------------------------------------------------------- Encounter Discharge Information Details Patient Name: Date of Service: [name] [name]. 12/19/2023 10:15 A M Medical Record Number: [id number] Patient Account Number: [id number] Date of Birth/Sex: Treating RN: 1956-04-19 (68 y.o. NETTY Merleen Handing Primary Care Caragh Gasper: Keren Berg Other Clinician: Referring Audrionna Lampton: Treating Sierra Spargo/Extender: Marolyn Delon Keren Berg Devra in Treatment: 57 Encounter Discharge Information Items Post  Procedure Vitals Discharge Condition: Stable Temperature (F): 98.8 Ambulatory Status: Ambulatory Pulse (bpm): 90 Discharge Destination: Home Respiratory Rate (breaths/min): 18 Transportation: Private Auto Blood Pressure (mmHg): 150/76 Accompanied By: self Schedule Follow-up Appointment: Yes Clinical Summary of Care: Patient Declined Electronic Signature(s) Signed: 12/19/2023 3:55:02 PM By: Merleen Handing RN, BSN Entered By: Merleen Handing on 12/19/2023 08:13:06 [name] [name] ([id number]) [id number] Page 2 of 10 -------------------------------------------------------------------------------- Lower Extremity Assessment Details Patient Name: Date of Service: [name], [name] 12/19/2023 10:15 A M Medical Record Number: [id number] Patient Account Number: [id number] Date of Birth/Sex: Treating RN: [birth year] (68 y.o. NETTY Merleen Handing Primary Care Macyn Shropshire: Keren Berg Other Clinician: Referring Tagg Eustice: Treating Marquavion Venhuizen/Extender: Marolyn Delon Keren Berg Devra in Treatment: 66 Edema Assessment Assessed: Colletta: No] Glenis: No] Edema: [Left: Ye] [Right: s] Calf Left: Right: Point of Measurement: From Medial Instep 47 cm Ankle Left: Right: Point of Measurement: From Medial Instep 27 cm Vascular Assessment Pulses: Dorsalis Pedis Palpable: [Left:Yes] Extremity colors, hair growth, and conditions: Extremity Color: [Left:Hyperpigmented] Hair Growth on Extremity: [Left:No] Temperature of Extremity: [Left:Warm] Capillary Refill: [Left:< 3 seconds] Dependent Rubor: [Left:No Yes] Electronic Signature(s) Signed: 12/19/2023 3:55:02 PM By: Merleen Handing RN, BSN Entered By: Merleen Handing on 12/19/2023 07:27:18 -------------------------------------------------------------------------------- Multi Wound Chart Details Patient Name: Date of Service: [name], [name] [name]. 12/19/2023 10:15 A M Medical Record Number: [id number] Patient Account  Number: [id number] Date of Birth/Sex: Treating RN: 03-Feb-1956 (68 y.o. M) Primary Care Jaiven Graveline: Keren Berg Other Clinician: Referring Kahliyah Dick: Treating Nakenya Theall/Extender: Marolyn Delon Keren Berg Devra in Treatment: 11 Vital Signs Height(in): 74 Capillary Blood Glucose(mg/dl): 810 Weight(lbs): 754 Pulse(bpm): 90 Body Mass Index(BMI): 31.5 Blood Pressure(mmHg): 150/76 Temperature(F): 98.8 Respiratory Rate(breaths/min): 20 [1:Photos:] [N/A:N/A [id number] Page 3 of 10] Left Calcaneus Left, Dorsal Foot N/A Wound Location: Pressure Injury Pressure Injury N/A Wounding Event: Diabetic Wound/Ulcer of the Lower Diabetic Wound/Ulcer of the Lower N/A Primary Etiology: Extremity Extremity Cataracts, Sleep Apnea, Hypertension, Cataracts, Sleep Apnea, Hypertension, N/A Comorbid History: Type II Diabetes,  Osteomyelitis, Type II Diabetes, Osteomyelitis, Neuropathy Neuropathy 03/09/2019 07/09/2022 N/A Date Acquired: 4 66 N/A Weeks of Treatment: Open Open N/A Wound Status: No No N/A Wound Recurrence: No Yes N/A Clustered Wound: N/A 1 N/A Clustered Quantity: 5.1x5x1.5 0.1x0.1x0.1 N/A Measurements L x W x D (cm) 20.028 0.008 N/A A (cm) : rea 30.041 0.001 N/A Volume (cm) : 7.30% 99.90% N/A % Reduction in A rea: -39.10% 99.90% N/A % Reduction in Volume: 7 Position 1 (o'clock): 2 Maximum Distance 1 (cm): Yes No N/A Tunneling: Grade 3 Grade 2 N/A Classification: Large Small N/A Exudate A mount: Serosanguineous Serous N/A Exudate Type: red, brown amber N/A Exudate Color: Thickened Flat and Intact N/A Wound Margin: Large (67-100%) Small (1-33%) N/A Granulation A mount: Red, Pale, Hyper-granulation Pink N/A Granulation Quality: Small (1-33%) None Present (0%) N/A Necrotic A mount: Fat Layer (Subcutaneous Tissue): Yes Fat Layer (Subcutaneous Tissue): Yes N/A Exposed Structures: Bone: Yes Fascia: No Fascia: No Tendon:  No Tendon: No Muscle: No Muscle: No Joint: No Joint: No Bone: No Small (1-33%) Large (67-100%) N/A Epithelialization: Debridement - Excisional N/A N/A Debridement: Pre-procedure Verification/Time Out 10:40 N/A N/A Taken: Lidocaine  4% Topical Solution N/A N/A Pain Control: Callus, Subcutaneous, Slough N/A N/A Tissue Debrided: Skin/Subcutaneous Tissue N/A N/A Level: 25.02 N/A N/A Debridement A (sq cm): rea Curette N/A N/A Instrument: Minimum N/A N/A Bleeding: Pressure N/A N/A Hemostasis A chieved: 0 N/A N/A Procedural Pain: 0 N/A N/A Post Procedural Pain: Procedure was tolerated well N/A N/A Debridement Treatment Response: 5.1x5x1.5 N/A N/A Post Debridement Measurements L x W x D (cm) 30.041 N/A N/A Post Debridement Volume: (cm) Callus: Yes Scarring: Yes N/A Periwound Skin Texture: Excoriation: No Excoriation: No Induration: No Induration: No Crepitus: No Callus: No Rash: No Crepitus: No Scarring: No Rash: No Maceration: Yes Maceration: No N/A Periwound Skin Moisture: Dry/Scaly: No Dry/Scaly: No Atrophie Blanche: No Atrophie Blanche: No N/A Periwound Skin Color: Cyanosis: No Cyanosis: No Ecchymosis: No Ecchymosis: No Erythema: No Erythema: No Hemosiderin Staining: No Hemosiderin Staining: No Mottled: No Mottled: No Pallor: No Pallor: No Rubor: No Rubor: No No Abnormality No Abnormality N/A Temperature: Debridement N/A N/A Procedures Performed: Treatment Notes Electronic Signature(s) Signed: 12/19/2023 11:00:38 AM By: Marolyn Nest MD FACS Earling, Alcolu [name] ([id number]) [id number] Page 4 of 10 Entered By: Marolyn Nest on 12/19/2023 08:00:38 -------------------------------------------------------------------------------- Multi-Disciplinary Care Plan Details Patient Name: Date of Service: [place], [name] 12/19/2023 10:15 A M Medical Record Number: [id number] Patient Account Number: [id number] Date of  Birth/Sex: Treating RN: [birth year] (68 y.o. NETTY Merleen Handing Primary Care Carlen Fils: Keren Berg Other Clinician: Referring Dontel Harshberger: Treating Malcome Ambrocio/Extender: Marolyn Nest Keren Berg Devra in Treatment: 66 Multidisciplinary Care Plan reviewed with physician Active Inactive Nutrition Nursing Diagnoses: Imbalanced nutrition Impaired glucose control: actual or potential Potential for alteratiion in Nutrition/Potential for imbalanced nutrition Goals: Patient/caregiver will maintain therapeutic glucose control Date Initiated: 05/27/2023 Target Resolution Date: 01/16/2024 Goal Status: Active Interventions: Assess HgA1c results as ordered upon admission and as needed Assess patient nutrition upon admission and as needed per policy Provide education on elevated blood sugars and impact on wound healing Treatment Activities: Patient referred to Primary Care Physician for further nutritional evaluation : 05/27/2023 Notes: 8/28- Juven x2/day Wound/Skin Impairment Nursing Diagnoses: Impaired tissue integrity Goals: Patient/caregiver will verbalize understanding of skin care regimen Date Initiated: 10/06/2022 Target Resolution Date: 01/16/2024 Goal Status: Active Ulcer/skin breakdown will have a volume reduction of 30% by week 4 Date Initiated: 09/10/2022 Date Inactivated: 10/06/2022 Target Resolution Date: 10/08/2022 Goal Status: Unmet  Unmet Reason: osteo, HBOT Ulcer/skin breakdown will have a volume reduction of 50% by week 8 Date Initiated: 10/06/2022 Date Inactivated: 02/14/2023 Target Resolution Date: 01/14/2023 Unmet Reason: insufficient perfusion Goal Status: Unmet to Left lower extremity Ulcer/skin breakdown will have a volume reduction of 80% by week 12 Date Initiated: 02/14/2023 Date Inactivated: 03/14/2023 Target Resolution Date: 03/15/2023 Goal Status: Unmet Unmet Reason: osteomyelitis Interventions: Assess ulceration(s) every visit Provide education on ulcer  and skin care Treatment Activities: Consult for HBO : 09/10/2022 Skin care regimen initiated : 09/10/2022 Notes: NPWT started 12/27/22 [name] [name], [name] [name] ([id number]) [phone].pdf Page 5 of 10 Electronic Signature(s) Signed: 12/19/2023 3:55:02 PM By: Merleen Handing RN, BSN Entered By: Boehlein, Linda on 12/19/2023 07:37:29 -------------------------------------------------------------------------------- Pain Assessment Details Patient Name: Date of Service: [place], [name] 12/19/2023 10:15 A M Medical Record Number: [id number] Patient Account Number: [id number] Date of Birth/Sex: Treating RN: 08/13/1956 (68 y.o. M) Primary Care Faun Mcqueen: Keren Berg Other Clinician: Referring Amor Packard: Treating Gabreille Dardis/Extender: Marolyn Delon Keren Berg Devra in Treatment: 23 Active Problems Location of Pain Severity and Description of Pain Patient Has Paino No Site Locations Rate the pain. Current Pain Level: 0 Pain Management and Medication Current Pain Management: Electronic Signature(s) Signed: 12/19/2023 3:55:02 PM By: Merleen Handing RN, BSN Entered By: Boehlein, Linda on 12/19/2023 07:23:59 -------------------------------------------------------------------------------- Patient/Caregiver Education Details Patient Name: Date of Service: [name] [name] 1/6/2025andnbsp10:15 A M Medical Record Number: [id number] Patient Account Number: [id number] Date of Birth/Gender: Treating RN: 10-24-1956 (68 y.o. NETTY Merleen Handing Primary Care Physician: Keren Berg Other Clinician: Referring Physician: Treating Physician/Extender: Marolyn Delon Keren Berg Devra in Treatment: 58 Education Assessment Education Provided To: [name] [name], [name] [name] ([id number]) [id number] Page 6 of 10 Patient Education Topics Provided Elevated Blood Sugar/ Impact on Healing: Methods: Explain/Verbal Responses: Reinforcements needed, State content  correctly Offloading: Methods: Explain/Verbal Responses: Reinforcements needed, State content correctly Wound/Skin Impairment: Methods: Explain/Verbal Responses: Reinforcements needed, State content correctly Electronic Signature(s) Signed: 12/19/2023 3:55:02 PM By: Merleen Handing RN, BSN Entered By: Merleen Handing on 12/19/2023 07:37:59 -------------------------------------------------------------------------------- Wound Assessment Details Patient Name: Date of Service: [place], [name] [name]. 12/19/2023 10:15 A M Medical Record Number: [id number] Patient Account Number: [id number] Date of Birth/Sex: Treating RN: Sep 16, 1956 (68 y.o. NETTY Merleen Handing Primary Care Evie Crumpler: Keren Berg Other Clinician: Referring Shaliah Wann: Treating Clea Dubach/Extender: Marolyn Delon Keren Berg Devra in Treatment: 66 Wound Status Wound Number: 1 Primary Diabetic Wound/Ulcer of the Lower Extremity Etiology: Wound Location: Left Calcaneus Wound Open Wounding Event: Pressure Injury Status: Date Acquired: 03/09/2019 Comorbid Cataracts, Sleep Apnea, Hypertension, Type II Diabetes, Weeks Of Treatment: 66 History: Osteomyelitis, Neuropathy Clustered Wound: No Photos Wound Measurements Length: (cm) 5.1 Width: (cm) 5 Depth: (cm) 1.5 Area: (cm) 20.028 Volume: (cm) 30.041 % Reduction in Area: 7.3% % Reduction in Volume: -39.1% Epithelialization: Small (1-33%) Tunneling: Yes Position (o'clock): 7 Maximum Distance: (cm) 2 Wound Description Classification: Grade 3 Wound Margin: Thickened Exudate Amount: Large Exudate Type: Serosanguineous [name] [name], [name] [name] ([id number]) Exudate Color: red, brown Foul Odor After Cleansing: No Slough/Fibrino Yes [id number] Page 7 of 10 Wound Bed Granulation Amount: Large (67-100%) Exposed Structure Granulation Quality: Red, Pale, Hyper-granulation Fascia Exposed: No Necrotic Amount: Small (1-33%) Fat Layer (Subcutaneous Tissue)  Exposed: Yes Necrotic Quality: Adherent Slough Tendon Exposed: No Muscle Exposed: No Joint Exposed: No Bone Exposed: Yes Periwound Skin Texture Texture Color No Abnormalities Noted: No No Abnormalities Noted: No Callus: Yes Atrophie Blanche: No Crepitus: No Cyanosis: No Excoriation: No Ecchymosis: No Induration: No Erythema: No Rash: No Hemosiderin  Staining: No Scarring: No Mottled: No Pallor: No Moisture Rubor: No No Abnormalities Noted: No Dry / Scaly: No Temperature / Pain Maceration: Yes Temperature: No Abnormality Treatment Notes Wound #1 (Calcaneus) Wound Laterality: Left Cleanser Soap and Water Discharge Instruction: May shower and wash wound with dial antibacterial soap and water prior to dressing change. Vashe 5.8 (oz) Discharge Instruction: Cleanse the wound with Vashe prior to applying a clean dressing using gauze sponges, not tissue or cotton balls. Peri-Wound Care Zinc Oxide Ointment 30g tube Discharge Instruction: Apply Zinc Oxide to periwound with each dressing change Topical Gentamicin Discharge Instruction: Apply to wound when keystone not available Mupirocin Ointment Discharge Instruction: when keystone not available apply Mupirocin (Bactroban) as instructed keystone antibiotic compound Discharge Instruction: at home Primary Dressing Aquacel Ag Extra Hydrofiber Dressing w/ Silver , 4x5 (in/in) Discharge Instruction: apply to wound bed and be sure tuck into tunnel as well Secondary Dressing ABD Pad, 8x10 Discharge Instruction: Apply over primary dressing as directed. Woven Gauze Sponge, Non-Sterile 4x4 in Discharge Instruction: Wet with Vashe we to dry. Secured With [employer] 4x5 (in/yd) Discharge Instruction: Secure with Coban as directed. Kerlix Roll Sterile, 4.5x3.1 (in/yd) Discharge Instruction: Secure with Kerlix as directed. Paper Tape, 2x10 (in/yd) Discharge Instruction: Secure dressing with tape as  directed. Compression Wrap Compression Stockings Add-Ons [name], Sterile, 6(in) [name] [name], [name] [name] ([id number]) [id number] Page 8 of 10 Electronic Signature(s) Signed: 12/19/2023 3:55:02 PM By: Merleen Handing RN, BSN Entered By: Boehlein, Linda on 12/19/2023 07:33:25 -------------------------------------------------------------------------------- Wound Assessment Details Patient Name: Date of Service: [place], [name] 12/19/2023 10:15 A M Medical Record Number: [id number] Patient Account Number: [id number] Date of Birth/Sex: Treating RN: [birth year] (68 y.o. NETTY Merleen Handing Primary Care Ryer Asato: Keren Berg Other Clinician: Referring Maicee Ullman: Treating Yalissa Fink/Extender: Marolyn Delon Keren Berg Devra in Treatment: 66 Wound Status Wound Number: 2 Primary Diabetic Wound/Ulcer of the Lower Extremity Etiology: Wound Location: Left, Dorsal Foot Wound Open Wounding Event: Pressure Injury Status: Date Acquired: 07/09/2022 Comorbid Cataracts, Sleep Apnea, Hypertension, Type II Diabetes, Weeks Of Treatment: 66 History: Osteomyelitis, Neuropathy Clustered Wound: Yes Photos Wound Measurements Length: (cm) Width: (cm) Depth: (cm) Clustered Quantity: Area: (cm) Volume: (cm) 0.1 % Reduction in Area: 99.9% 0.1 % Reduction in Volume: 99.9% 0.1 Epithelialization: Large (67-100%) 1 Tunneling: No 0.008 Undermining: No 0.001 Wound Description Classification: Grade 2 Wound Margin: Flat and Intact Exudate Amount: Small Exudate Type: Serous Exudate Color: amber Foul Odor After Cleansing: No Slough/Fibrino Yes Wound Bed Granulation Amount: Small (1-33%) Exposed Structure Granulation Quality: Pink Fascia Exposed: No Necrotic Amount: None Present (0%) Fat Layer (Subcutaneous Tissue) Exposed: Yes Tendon Exposed: No Muscle Exposed: No Joint Exposed: No Bone Exposed: No Periwound Skin Texture Texture Color No Abnormalities  Noted: No No Abnormalities Noted: No Callus: No Atrophie Blanche: No Crepitus: No Cyanosis: No Excoriation: No Ecchymosis: No Induration: No Erythema: No [name] [name], [name] [name] ([id number]) [id number] Page 9 of 10 Induration: No Erythema: No Rash: No Hemosiderin Staining: No Scarring: Yes Mottled: No Pallor: No Moisture Rubor: No No Abnormalities Noted: No Dry / Scaly: No Temperature / Pain Maceration: No Temperature: No Abnormality Treatment Notes Wound #2 (Foot) Wound Laterality: Dorsal, Left Cleanser Soap and Water Discharge Instruction: May shower and wash wound with dial antibacterial soap and water prior to dressing change. Vashe 5.8 (oz) Discharge Instruction: Cleanse the wound with Vashe prior to applying a clean dressing using gauze sponges, not tissue or cotton balls. Peri-Wound Care Topical Gentamicin Discharge Instruction: Apply to wound when keystone not  available Mupirocin Ointment Discharge Instruction: when keystone not available apply Mupirocin (Bactroban) as instructed keystone Primary Dressing Aquacel Ag Extra Hydrofiber Dressing w/ Silver , 4x5 (in/in) Secondary Dressing Woven Gauze Sponge, Non-Sterile 4x4 in Discharge Instruction: Wet with Vashe we to dry. Zetuvit Plus 4x8 in Discharge Instruction: Apply over primary dressing as directed. Secured With [employer] 4x5 (in/yd) Discharge Instruction: Secure with Coban as directed. Kerlix Roll Sterile, 4.5x3.1 (in/yd) Discharge Instruction: Secure with Kerlix as directed. Paper Tape, 2x10 (in/yd) Discharge Instruction: Secure dressing with tape as directed. Compression Wrap Compression Stockings Add-Ons Electronic Signature(s) Signed: 12/19/2023 3:55:02 PM By: Boehlein, Linda RN, BSN Entered By: Boehlein, Linda on 12/19/2023 07:34:13 -------------------------------------------------------------------------------- Vitals Details Patient Name: Date of  Service: [name], [name] [name]. 12/19/2023 10:15 A M Medical Record Number: [id number] Patient Account Number: [id number] Date of Birth/Sex: Treating RN: [birth year] (68 y.o. M) Primary Care Matha Masse: Keren Berg Other Clinician: Referring Alysha Doolan: Treating Elsie Baynes/Extender: Marolyn Delon Keren Berg Devra in Treatment: 41 Vital Signs Time Taken: 10:19 Temperature (F): 98.8 Height (in): 74 Pulse (bpm): [address] [name] ([id number]) [id number].pdf Page 10 of 10 Weight (lbs): 245 Respiratory Rate (breaths/min): 20 Body Mass Index (BMI): 31.5 Blood Pressure (mmHg): 150/76 Capillary Blood Glucose (mg/dl): 810 Reference Range: 80 - 120 mg / dl Notes glucose per pt report this am Electronic Signature(s) Signed: 12/19/2023 3:55:02 PM By: Merleen Handing RN, BSN Entered By: Boehlein, Linda on 12/19/2023 92:76:71

## 2023-12-30 ENCOUNTER — Observation Stay (HOSPITAL_COMMUNITY)
Admission: EM | Admit: 2023-12-30 | Discharge: 2024-01-02 | Disposition: A | Discharge status: Home / Self Care | Payer: Medicare (Managed Care) | Attending: Internal Medicine | Admitting: Internal Medicine

## 2023-12-30 ENCOUNTER — Other Ambulatory Visit: Payer: Self-pay

## 2023-12-30 ENCOUNTER — Encounter (HOSPITAL_COMMUNITY): Payer: Self-pay | Admitting: Emergency Medicine

## 2023-12-30 ENCOUNTER — Emergency Department (HOSPITAL_COMMUNITY): Payer: Medicare (Managed Care)

## 2023-12-30 DIAGNOSIS — Z794 Long term (current) use of insulin: Secondary | ICD-10-CM | POA: Diagnosis not present

## 2023-12-30 DIAGNOSIS — F1090 Alcohol use, unspecified, uncomplicated: Secondary | ICD-10-CM | POA: Insufficient documentation

## 2023-12-30 DIAGNOSIS — D638 Anemia in other chronic diseases classified elsewhere: Secondary | ICD-10-CM | POA: Diagnosis not present

## 2023-12-30 DIAGNOSIS — E1165 Type 2 diabetes mellitus with hyperglycemia: Secondary | ICD-10-CM | POA: Diagnosis not present

## 2023-12-30 DIAGNOSIS — E871 Hypo-osmolality and hyponatremia: Secondary | ICD-10-CM | POA: Diagnosis not present

## 2023-12-30 DIAGNOSIS — E11621 Type 2 diabetes mellitus with foot ulcer: Secondary | ICD-10-CM | POA: Diagnosis not present

## 2023-12-30 DIAGNOSIS — E66811 Obesity, class 1: Secondary | ICD-10-CM | POA: Insufficient documentation

## 2023-12-30 DIAGNOSIS — Z6831 Body mass index (BMI) 31.0-31.9, adult: Secondary | ICD-10-CM | POA: Insufficient documentation

## 2023-12-30 DIAGNOSIS — Z7901 Long term (current) use of anticoagulants: Secondary | ICD-10-CM | POA: Insufficient documentation

## 2023-12-30 DIAGNOSIS — E039 Hypothyroidism, unspecified: Secondary | ICD-10-CM | POA: Insufficient documentation

## 2023-12-30 DIAGNOSIS — I739 Peripheral vascular disease, unspecified: Secondary | ICD-10-CM | POA: Diagnosis not present

## 2023-12-30 DIAGNOSIS — E11628 Type 2 diabetes mellitus with other skin complications: Secondary | ICD-10-CM | POA: Diagnosis present

## 2023-12-30 DIAGNOSIS — M8660 Other chronic osteomyelitis, unspecified site: Secondary | ICD-10-CM | POA: Diagnosis present

## 2023-12-30 DIAGNOSIS — Z79899 Other long term (current) drug therapy: Secondary | ICD-10-CM | POA: Diagnosis not present

## 2023-12-30 DIAGNOSIS — E785 Hyperlipidemia, unspecified: Secondary | ICD-10-CM | POA: Insufficient documentation

## 2023-12-30 DIAGNOSIS — L03119 Cellulitis of unspecified part of limb: Secondary | ICD-10-CM | POA: Diagnosis not present

## 2023-12-30 DIAGNOSIS — L03116 Cellulitis of left lower limb: Secondary | ICD-10-CM | POA: Insufficient documentation

## 2023-12-30 DIAGNOSIS — L97509 Non-pressure chronic ulcer of other part of unspecified foot with unspecified severity: Secondary | ICD-10-CM | POA: Diagnosis not present

## 2023-12-30 DIAGNOSIS — L0390 Cellulitis, unspecified: Principal | ICD-10-CM

## 2023-12-30 LAB — CBC WITH DIFFERENTIAL/PLATELET
Abs Immature Granulocytes: 0.1 10*3/uL — ABNORMAL HIGH (ref 0.00–0.07)
Basophils Absolute: 0 10*3/uL (ref 0.0–0.1)
Basophils Relative: 1 %
Eosinophils Absolute: 0.1 10*3/uL (ref 0.0–0.5)
Eosinophils Relative: 1 %
HCT: 33.3 % — ABNORMAL LOW (ref 39.0–52.0)
Hemoglobin: 10.7 g/dL — ABNORMAL LOW (ref 13.0–17.0)
Immature Granulocytes: 1 %
Lymphocytes Relative: 9 %
Lymphs Abs: 0.8 10*3/uL (ref 0.7–4.0)
MCH: 25.5 pg — ABNORMAL LOW (ref 26.0–34.0)
MCHC: 32.1 g/dL (ref 30.0–36.0)
MCV: 79.3 fL — ABNORMAL LOW (ref 80.0–100.0)
Monocytes Absolute: 0.6 10*3/uL (ref 0.1–1.0)
Monocytes Relative: 7 %
Neutro Abs: 6.8 10*3/uL (ref 1.7–7.7)
Neutrophils Relative %: 81 %
Platelets: 424 10*3/uL — ABNORMAL HIGH (ref 150–400)
RBC: 4.2 MIL/uL — ABNORMAL LOW (ref 4.22–5.81)
RDW: 14.3 % (ref 11.5–15.5)
WBC: 8.3 10*3/uL (ref 4.0–10.5)
nRBC: 0 % (ref 0.0–0.2)

## 2023-12-30 LAB — COMPREHENSIVE METABOLIC PANEL
ALT: 22 U/L (ref 0–44)
AST: 17 U/L (ref 15–41)
Albumin: 2.9 g/dL — ABNORMAL LOW (ref 3.5–5.0)
Alkaline Phosphatase: 66 U/L (ref 38–126)
Anion gap: 10 (ref 5–15)
BUN: 24 mg/dL — ABNORMAL HIGH (ref 8–23)
CO2: 26 mmol/L (ref 22–32)
Calcium: 9.9 mg/dL (ref 8.9–10.3)
Chloride: 95 mmol/L — ABNORMAL LOW (ref 98–111)
Creatinine, Ser: 1.01 mg/dL (ref 0.61–1.24)
GFR, Estimated: >60 mL/min (ref >60)
Glucose, Bld: 274 mg/dL — ABNORMAL HIGH (ref 70–99)
Potassium: 5.1 mmol/L (ref 3.5–5.1)
Sodium: 131 mmol/L — ABNORMAL LOW (ref 135–145)
Total Bilirubin: 1 mg/dL (ref 0.0–1.2)
Total Protein: 7.6 g/dL (ref 6.5–8.1)

## 2023-12-30 LAB — GLUCOSE, CAPILLARY: Glucose-Capillary: 295 mg/dL — ABNORMAL HIGH (ref 70–99)

## 2023-12-30 LAB — I-STAT CG4 LACTIC ACID, ED
Lactic Acid, Venous: 0.9 mmol/L (ref 0.5–1.9)
Lactic Acid, Venous: 0.7 mmol/L (ref 0.5–1.9)

## 2023-12-30 MED ORDER — INSULIN GLARGINE-YFGN 100 UNIT/ML ~~LOC~~ SOLN
50.0000 [IU] | Freq: Two times a day (BID) | SUBCUTANEOUS | Status: DC
  Filled 2023-12-30: qty 0.5

## 2023-12-30 MED ORDER — ACETAMINOPHEN 325 MG PO TABS
650.0000 mg | ORAL_TABLET | Freq: Four times a day (QID) | ORAL | Status: DC | PRN
  Administered 2024-01-01: 650 mg via ORAL
  Filled 2023-12-30: qty 2

## 2023-12-30 MED ORDER — ENOXAPARIN SODIUM 40 MG/0.4ML IJ SOSY
40.0000 mg | PREFILLED_SYRINGE | INTRAMUSCULAR | Status: DC
  Filled 2023-12-30 (×2): qty 0.4

## 2023-12-30 MED ORDER — ACETAMINOPHEN 650 MG RE SUPP: 650.0000 mg | Freq: Four times a day (QID) | RECTAL | Status: DC | PRN

## 2023-12-30 MED ORDER — ASPIRIN 81 MG PO TBEC
81.0000 mg | DELAYED_RELEASE_TABLET | Freq: Every day | ORAL | Status: DC
  Administered 2023-12-30 – 2024-01-02 (×4): 81 mg via ORAL
  Filled 2023-12-30 (×4): qty 1

## 2023-12-30 MED ORDER — INSULIN GLARGINE-YFGN 100 UNIT/ML ~~LOC~~ SOLN: 40.0000 [IU] | Freq: Two times a day (BID) | SUBCUTANEOUS | Status: DC

## 2023-12-30 MED ORDER — METRONIDAZOLE 500 MG/100ML IV SOLN
500.0000 mg | Freq: Two times a day (BID) | INTRAVENOUS | Status: DC
  Administered 2023-12-30 – 2024-01-01 (×4): 500 mg via INTRAVENOUS
  Filled 2023-12-30 (×4): qty 100

## 2023-12-30 MED ORDER — SODIUM CHLORIDE 0.9 % IV SOLN
2.0000 g | INTRAVENOUS | Status: DC
  Administered 2023-12-30 – 2023-12-31 (×2): 2 g via INTRAVENOUS
  Filled 2023-12-30 (×2): qty 20

## 2023-12-30 MED ORDER — ATORVASTATIN CALCIUM 10 MG PO TABS
10.0000 mg | ORAL_TABLET | Freq: Every day | ORAL | Status: DC
  Administered 2023-12-30 – 2024-01-01 (×3): 10 mg via ORAL
  Filled 2023-12-30 (×3): qty 1

## 2023-12-30 MED ORDER — INSULIN ASPART 100 UNIT/ML IJ SOLN
0.0000 [IU] | Freq: Three times a day (TID) | INTRAMUSCULAR | Status: DC
  Administered 2023-12-31 (×2): 7 [IU] via SUBCUTANEOUS
  Administered 2023-12-31 – 2024-01-01 (×3): 11 [IU] via SUBCUTANEOUS
  Administered 2024-01-01 – 2024-01-02 (×2): 4 [IU] via SUBCUTANEOUS
  Filled 2023-12-30: qty 0.2

## 2023-12-30 MED ORDER — CLINDAMYCIN PHOSPHATE 600 MG/50ML IV SOLN
600.0000 mg | Freq: Once | INTRAVENOUS | Status: AC
  Administered 2023-12-30: 600 mg via INTRAVENOUS
  Filled 2023-12-30: qty 50

## 2023-12-30 MED ORDER — INSULIN GLARGINE-YFGN 100 UNIT/ML ~~LOC~~ SOLN
80.0000 [IU] | Freq: Every day | SUBCUTANEOUS | Status: DC
Start: 2023-12-30 — End: 2023-12-30
  Filled 2023-12-30: qty 0.8

## 2023-12-30 MED ORDER — PROPRANOLOL HCL 20 MG PO TABS
20.0000 mg | ORAL_TABLET | Freq: Two times a day (BID) | ORAL | Status: DC
  Administered 2023-12-30 – 2024-01-02 (×6): 20 mg via ORAL
  Filled 2023-12-30 (×6): qty 1

## 2023-12-30 MED ORDER — INSULIN GLARGINE-YFGN 100 UNIT/ML ~~LOC~~ SOLN
80.0000 [IU] | Freq: Every day | SUBCUTANEOUS | Status: DC
  Administered 2023-12-31 – 2024-01-02 (×3): 80 [IU] via SUBCUTANEOUS
  Filled 2023-12-30 (×5): qty 0.8

## 2023-12-30 MED ORDER — INSULIN GLARGINE-YFGN 100 UNIT/ML ~~LOC~~ SOLN
50.0000 [IU] | Freq: Every day | SUBCUTANEOUS | Status: DC
Start: 2023-12-31 — End: 2023-12-30

## 2023-12-30 MED ORDER — LOSARTAN POTASSIUM 50 MG PO TABS
100.0000 mg | ORAL_TABLET | Freq: Every day | ORAL | Status: DC
  Administered 2023-12-31 – 2024-01-02 (×3): 100 mg via ORAL
  Filled 2023-12-30 (×4): qty 2

## 2023-12-30 MED ORDER — INSULIN GLARGINE-YFGN 100 UNIT/ML ~~LOC~~ SOLN
50.0000 [IU] | Freq: Every day | SUBCUTANEOUS | Status: DC
Start: 2023-12-30 — End: 2024-01-02
  Administered 2023-12-30 – 2024-01-01 (×3): 50 [IU] via SUBCUTANEOUS
  Filled 2023-12-30 (×5): qty 0.5

## 2023-12-30 NOTE — H&P (Signed)
History and Physical    Patient: [name] RUE:[id number] DOB: Jun 26, 1956 DOA: 12/30/2023 DOS: the patient was seen and examined on 12/30/2023 PCP: [name], MD  Patient coming from: Home  Chief Complaint:  Chief Complaint  Patient presents with   Wound Infection   HPI: [name] is a 68 y.o. 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  Patient reports that he has had a left diabetic heel ulcer and an ulcerated area on the dorsum of his left foot for quite a while.  He has been following wound care as an outpatient for a while now and was noting some improvement.  However, over the past few days he has noted increasing redness and swelling in his left foot.  He was prescribed Augmentin and took it for a few days without relief.  He denies any nausea, vomiting, fevers, chills, chest pain, palpitations, shortness of breath, abdominal pain, diarrhea, urinary changes.  He denies any purulent drainage from his wound.  Per last wound care note on 1/6, Dr. Lady Gary notes that patient has had an ulcer on his heel for over 3 years.  He has been seen in multiple wound care clinics including [facility] and [facility] with the majority of physicians recommending BKA.  However, patient does not want to undergo BKA and would like to preserve his left lower extremity as long as possible.  Per Dr. America Brown note, the dorsal foot wound is basically epithelialized.  As far as the plantar heel wound, he has 2 tunnels, 1 of which probes to bone at the distal lateral aspect of his wound and the other probes just under the skin at the distal medial portion of his wound.  Of note, patient has undergone an angiogram with vascular surgery in March 2024 and thought to be optimized from a vascular  standpoint.  ED course: Vital signs stable.  CBC without leukocytosis, with stable hemoglobin, and with what appears to be reactive thrombocytosis.  CMP with mild hyponatremia to 131, hyperglycemia to 274, hypoalbuminemia, stable kidney function.  Lactate normal x 2.  Blood cultures pending x 2.  Left foot x-ray showing large chronic ulceration at the plantar aspect of the left heel, suspicious for acute on chronic osteomyelitis of the underlying calcaneus.  Patient started on IV antibiotics and [facility] hospitalist asked to evaluate patient for admission.  Review of Systems: As mentioned in the history of present illness. All other systems reviewed and are negative. Past Medical History:  Diagnosis Date   Diabetic foot ulcers (HCC)    DM2 (diabetes mellitus, type 2) (HCC)    HTN (hypertension)    Sleep apnea    No Cpap   Past Surgical History:  Procedure Laterality Date   ABDOMINAL AORTOGRAM W/LOWER EXTREMITY Left 02/18/2023   Procedure: ABDOMINAL AORTOGRAM W/LOWER EXTREMITY;  Surgeon: Leonie Douglas, MD;  Location: [facility]AS[facility];  Service: Cardiovascular;  Laterality: Left;   BACK SURGERY     Laminectomy-Bilateral   EYE SURGERY     Bilateral cataracts and eye implants   I & D EXTREMITY Left 09/21/2019   Procedure: LEFT PARTIAL CALCANEOUS EXCISION;  Surgeon: Nadara Mustard, MD;  Location: [facility] OR;  Service: Orthopedics;  Laterality: Left;   KNEE ARTHROSCOPY Left    SHOULDER ARTHROSCOPY  Right   Social History:  reports that he has never smoked. He has never used smokeless tobacco. He reports current alcohol use. He reports that he does not use drugs.  Allergies  Allergen Reactions   Bee Venom Anaphylaxis, Swelling and Rash         Cefepime Rash    Unclear if due to cefepime    History reviewed. No pertinent family history.  Prior to Admission medications   Medication Sig Start Date End Date Taking? Authorizing Provider  amoxicillin-clavulanate (AUGMENTIN) 875-125 MG  tablet Take 1 tablet by mouth 2 (two) times daily. 04/27/23   [provider]  aspirin EC 81 MG tablet Take 81 mg by mouth daily.     [provider]  atorvastatin (LIPITOR) 10 MG tablet Take 10 mg by mouth at bedtime.    [provider]  B-D UF III MINI PEN NEEDLES 31G X 5 MM MISC USE AS DIRECTED UP TO 3 TIMES DAILY 02/28/19   [provider]  Berberine Chloride 500 MG CAPS Take by mouth.    [provider]  Cholecalciferol (VITAMIN D3) 50 MCG (2000 UT) CAPS Take 2,000 Units by mouth daily.    [provider]  Continuous Blood Gluc Sensor (FREESTYLE LIBRE 14 DAY SENSOR) MISC Use to check blood glucose 01/22/19   [provider]  cyanocobalamin (VITAMIN B12) 1000 MCG tablet Take 1,000 mcg by mouth daily.    [provider]  doxycycline (VIBRAMYCIN) 100 MG capsule Take 100 mg by mouth 2 (two) times daily. 04/27/23   [provider]  EPINEPHrine (EPIPEN 2-PAK) 0.3 mg/0.3 mL IJ SOAJ injection Inject 0.3 mg into the muscle as needed for anaphylaxis.     [provider]  ferrous sulfate 325 (65 FE) MG tablet Take 325 mg by mouth daily with breakfast. Patient not taking: Reported on 05/24/2023    [provider]  furosemide (LASIX) 40 MG tablet Take 40 mg by mouth daily.     [provider]  losartan (COZAAR) 100 MG tablet Take 100 mg by mouth daily.    [provider]  NOVOLOG FLEXPEN 100 UNIT/ML FlexPen Inject 5-11 Units into the skin 3 (three) times daily with meals. Sliding scale 02/15/19   [provider]  oxyCODONE (OXY IR/ROXICODONE) 5 MG immediate release tablet Take 1 tablet (5 mg total) by mouth every 4 (four) hours as needed for moderate pain, severe pain or breakthrough pain. Patient not taking: Reported on 05/24/2023 02/23/23   Jonah Blue, MD  potassium chloride (K-DUR) 10 MEQ tablet Take 10 mEq by mouth daily. 04/12/18   [provider]  propranolol (INDERAL) 20  MG tablet Take 20 mg by mouth 2 (two) times daily. 12/24/22   [provider]  TRESIBA FLEXTOUCH 200 UNIT/ML SOPN Inject 50-85 Units into the skin See admin instructions. Take 85 units in the morning and 50 units at bedtime 09/04/19   [provider]    Physical Exam: Vitals:   12/30/23 1659 12/30/23 1745 12/30/23 1830 12/30/23 1900  BP: (!) 153/52 (!) 170/63 (!) 172/64 (!) 142/114  Pulse: 85 87 91 87  Resp: 18 18 18 18   Temp: 98.4 F (36.9 C)     TempSrc: Oral     SpO2: 99% 100% 99% 99%   Physical Exam Constitutional:      Appearance: Normal appearance. He is not ill-appearing.  HENT:     Head: Normocephalic and atraumatic.     Mouth/Throat:  Mouth: Mucous membranes are moist.     Pharynx: Oropharynx is clear. No oropharyngeal exudate.  Eyes:     General: No scleral icterus.    Extraocular Movements: Extraocular movements intact.     Pupils: Pupils are equal, round, and reactive to light.  Cardiovascular:     Rate and Rhythm: Normal rate and regular rhythm.     Pulses: Normal pulses.     Heart sounds: Normal heart sounds. No murmur heard.    No friction rub. No gallop.  Pulmonary:     Effort: Pulmonary effort is normal.     Breath sounds: Normal breath sounds. No wheezing, rhonchi or rales.  Abdominal:     General: Bowel sounds are normal. There is no distension.     Palpations: Abdomen is soft.     Tenderness: There is no abdominal tenderness. There is no guarding or rebound.  Musculoskeletal:        General: Normal range of motion.     Cervical back: Normal range of motion.     Comments: 1-2+ pitting edema in LLE up to mid-shin.  Skin:    General: Skin is warm and dry.     Comments: Chronic heel wound and chronic dorsal foot wound present in LLE. Redness/erythema present on LLE extending up to mid-shin. No purulent drainage noted.  Neurological:     General: No focal deficit present.     Mental Status: He is alert and oriented to person, place,  and time.     Comments: Sensation absent distal to mid-shins bilaterally.   Psychiatric:        Mood and Affect: Mood normal.        Behavior: Behavior normal.          Data Reviewed:  There are no new results to review at this time.  Assessment and Plan: No notes have been filed under this hospital service. 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 Lady Gary, as an outpatient.  Despite several specialist recommending BKA, patient would like to preserve his left lower extremity as long as possible.  Fortunately, per last [facility] evaluation in March 2024, he appears to have good perfusion to his extremities.  He does have overlying cellulitis extending to the mid shin on his left lower extremity.  Foot x-ray with findings suspicious for acute on chronic osteomyelitis of the left calcaneus.  Though patient remains afebrile, without leukocytosis, and with normal lactate, will obtain MRI of left foot without contrast for further evaluation to rule out acute osteomyelitis. -Switch to ceftriaxone and Flagyl for empiric coverage -MRI of left heel to ensure no acute osteomyelitis present -Lovenox for DVT prophylaxis -Follow-up blood cultures x 2, MRSA nasal swab -Carb modified diet -Place in observation  Hypertension Blood pressure elevated in the ED to the 160s/70s, asymptomatic.  On losartan 100 mg daily and propranolol 20 mg twice daily at home. -Resume home losartan and propranolol  Poorly controlled type 2 diabetes with retinopathy and neuropathy Patient with longstanding history of poorly controlled type 2 diabetes.  Last A1c was 9.1% in March 2024.  Per patient, he is currently taking long-acting insulin 100 units every morning and 60 units every night.  He is also  on a sliding scale insulin on top of this.  Will  recheck A1c level while here.  Random glucose on CMP is 274.  Will slightly decrease insulin regimen while here, can increase tomorrow pending CBGs. -Follow-up A1c -Semglee 80 units every morning and 50 units every night -Trend CBGs, goal 140-180 -Carb modified diet  Chronic hyponatremia Appears to be dating back to 10 months ago, sodium levels ranging from 132-134. Sodium level is 131 on admission. Given mild nature, will observe overnight and recheck sodium level tomorrow. -monitor I/O's -trend sodium level  Atherosclerosis of native arteries of left leg -Continue home aspirin and statin   Advance Care Planning:   Code Status: Full Code   Consults: none  Family Communication: no family at bedside  Severity of Illness: The appropriate patient status for this patient is OBSERVATION. Observation status is judged to be reasonable and necessary in order to provide the required intensity of service to ensure the patient's safety. The patient's presenting symptoms, physical exam findings, and initial radiographic and laboratory data in the context of their medical condition is felt to place them at decreased risk for further clinical deterioration. Furthermore, it is anticipated that the patient will be medically stable for discharge from the hospital within 2 midnights of admission.   Portions of this note were generated with Scientist, clinical (histocompatibility and immunogenetics). Dictation errors may occur despite best attempts at proofreading.   Author: Briscoe Burns, MD 12/30/2023 8:02 PM  For on call review [email or web address].

## 2023-12-30 NOTE — ED Provider Triage Note (Signed)
Emergency Medicine Provider Triage Evaluation Note  [name] , a 68 y.o. male  was evaluated in triage.  Pt complains of chronic left foot wound that has been present for past 4 years, LLE swelling, and warmth. No pain d/t neuropathy  Seen at [facility] q month. Took 6 pills of augmentin left over for possible infection.    Review of Systems  Positive: L chronic wound, drainage, LLE swelling, warmth Negative: fever  Physical Exam  BP 136/69   Pulse 84   Temp 99.7 F (37.6 C) (Oral)   Resp 18   SpO2 100%  Gen:   Awake, no distress   Resp:  Normal effort  MSK:   Moves extremities without difficulty  Other:  Chronic wound to plantar surface of left foot, drainage, LLE swelling, and warmth to left calf.  Medical Decision Making  Medically screening exam initiated at 1:42 PM.  Appropriate orders placed.  [name] was informed that the remainder of the evaluation will be completed by another provider, this initial triage assessment does not replace that evaluation, and the importance of remaining in the ED until their evaluation is complete.  XR and labs ordered   [name], [place] 12/30/23 1354

## 2023-12-30 NOTE — ED Provider Notes (Addendum)
[place] EMERGENCY DEPARTMENT AT [facility] Provider Note   CSN: [id number] Arrival date & time: 12/30/23  1247     History  Chief Complaint  Patient presents with   Wound Infection    [name] is a 68 y.o. male.  68 year old male with history of diabetes as well as chronic wound to left foot presents with increasing erythema to his right foot.  Patient has known left diabetic heel ulcer as well as ulcerated area on the top of left foot.  Has noted increasing erythema as well as low-grade temp x 3 days.  Patient has been Augmentin for several days without relief.  Denies any vomiting or diarrhea.  History of trauma       Home Medications Prior to Admission medications   Medication Sig Start Date End Date Taking? Authorizing Provider  amoxicillin-clavulanate (AUGMENTIN) 875-125 MG tablet Take 1 tablet by mouth 2 (two) times daily. 04/27/23   [provider]  aspirin EC 81 MG tablet Take 81 mg by mouth daily.     [provider]  atorvastatin (LIPITOR) 10 MG tablet Take 10 mg by mouth at bedtime.    [provider]  B-D UF III MINI PEN NEEDLES 31G X 5 MM MISC USE AS DIRECTED UP TO 3 TIMES DAILY 02/28/19   [provider]  Berberine Chloride 500 MG CAPS Take by mouth.    [provider]  Cholecalciferol (VITAMIN D3) 50 MCG (2000 UT) CAPS Take 2,000 Units by mouth daily.    [provider]  Continuous Blood Gluc Sensor (FREESTYLE LIBRE 14 DAY SENSOR) MISC Use to check blood glucose 01/22/19   [provider]  cyanocobalamin (VITAMIN B12) 1000 MCG tablet Take 1,000 mcg by mouth daily.    [provider]  doxycycline (VIBRAMYCIN) 100 MG capsule Take 100 mg by mouth 2 (two) times daily. 04/27/23   [provider]  EPINEPHrine (EPIPEN 2-PAK) 0.3 mg/0.3 mL IJ SOAJ injection Inject 0.3 mg into the muscle as needed for anaphylaxis.     [provider]  ferrous sulfate 325 (65 FE) MG  tablet Take 325 mg by mouth daily with breakfast. Patient not taking: Reported on 05/24/2023    [provider]  furosemide (LASIX) 40 MG tablet Take 40 mg by mouth daily.     [provider]  losartan (COZAAR) 100 MG tablet Take 100 mg by mouth daily.    [provider]  NOVOLOG FLEXPEN 100 UNIT/ML FlexPen Inject 5-11 Units into the skin 3 (three) times daily with meals. Sliding scale 02/15/19   [provider]  oxyCODONE (OXY IR/ROXICODONE) 5 MG immediate release tablet Take 1 tablet (5 mg total) by mouth every 4 (four) hours as needed for moderate pain, severe pain or breakthrough pain. Patient not taking: Reported on 05/24/2023 02/23/23   Jonah Blue, MD  potassium chloride (K-DUR) 10 MEQ tablet Take 10 mEq by mouth daily. 04/12/18   [provider]  propranolol (INDERAL) 20 MG tablet Take 20 mg by mouth 2 (two) times daily. 12/24/22   [provider]  TRESIBA FLEXTOUCH 200 UNIT/ML SOPN Inject 50-85 Units into the skin See admin instructions. Take 85 units in the morning and 50 units at bedtime 09/04/19   [provider]      Allergies    Bee venom and Cefepime    Review of Systems   Review of Systems  All other systems reviewed and are negative.   Physical Exam Updated  Vital Signs BP 123/81   Pulse 83   Temp 99.7 F (37.6 C) (Oral)   Resp 18   SpO2 99%  Physical Exam Vitals and nursing note reviewed.  Constitutional:      General: He is not in acute distress.    Appearance: Normal appearance. He is well-developed. He is not toxic-appearing.  HENT:     Head: Normocephalic and atraumatic.  Eyes:     General: Lids are normal.     Conjunctiva/sclera: Conjunctivae normal.     Pupils: Pupils are equal, round, and reactive to light.  Neck:     Thyroid: No thyroid mass.     Trachea: No tracheal deviation.  Cardiovascular:     Rate and Rhythm: Normal rate and regular rhythm.     Heart sounds: Normal heart sounds. No  murmur heard.    No gallop.  Pulmonary:     Effort: Pulmonary effort is normal. No respiratory distress.     Breath sounds: Normal breath sounds. No stridor. No decreased breath sounds, wheezing, rhonchi or rales.  Abdominal:     General: There is no distension.     Palpations: Abdomen is soft.     Tenderness: There is no abdominal tenderness. There is no rebound.  Musculoskeletal:        General: No tenderness. Normal range of motion.     Cervical back: Normal range of motion and neck supple.       Legs:  Skin:    General: Skin is warm and dry.     Findings: No abrasion or rash.  Neurological:     Mental Status: He is alert and oriented to person, place, and time. Mental status is at baseline.     GCS: GCS eye subscore is 4. GCS verbal subscore is 5. GCS motor subscore is 6.     Cranial Nerves: No cranial nerve deficit.     Sensory: No sensory deficit.     Motor: Motor function is intact.  Psychiatric:        Attention and Perception: Attention normal.        Speech: Speech normal.        Behavior: Behavior normal.     ED Results / Procedures / Treatments   Labs (all labs ordered are listed, but only abnormal results are displayed) Labs Reviewed  COMPREHENSIVE METABOLIC PANEL - Abnormal; Notable for the following components:      Result Value   Sodium 131 (*)    Chloride 95 (*)    Glucose, Bld 274 (*)    BUN 24 (*)    Albumin 2.9 (*)    All other components within normal limits  CBC WITH DIFFERENTIAL/PLATELET - Abnormal; Notable for the following components:   RBC 4.20 (*)    Hemoglobin 10.7 (*)    HCT 33.3 (*)    MCV 79.3 (*)    MCH 25.5 (*)    Platelets 424 (*)    Abs Immature Granulocytes 0.10 (*)    All other components within normal limits  CULTURE, BLOOD (ROUTINE X 2)  CULTURE, BLOOD (ROUTINE X 2)  I-STAT CG4 LACTIC ACID, ED    EKG None  Radiology DG Foot Complete Left Result Date: 12/30/2023 CLINICAL DATA:  Chronic left foot wound EXAM: LEFT FOOT -  COMPLETE 3+ VIEW COMPARISON:  02/21/2023 FINDINGS: Large chronic ulceration at the plantar aspect of the left heel. Chronic osseous remodeling of the calcaneal body with increased sclerosis. There is irregularity of the calcaneal cortex  underlying the anterior margin of the ulceration site suspicious for acute osteomyelitis. Bones are demineralized. No acute fracture or dislocation. Diffuse soft tissue swelling. 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 Electronically Signed   By: [name] D.O.   On: 12/30/2023 16:16    Procedures Procedures    Medications Ordered in ED Medications  clindamycin (CLEOCIN) IVPB 600 mg (has no administration in time range)    ED Course/ Medical Decision Making/ A&P                                 Medical Decision Making Risk Prescription drug management.   Patient with evidence of worsening cellulitis has not responded to outpatient therapy.  He had an x-ray which shows chronic osteomyelitis.  He has no evidence of necrotizing fasciitis.  Will admit to the hospital service        Final Clinical Impression(s) / ED Diagnoses Final diagnoses:  None    Rx / DC Orders ED Discharge Orders     None         Lorre Nick, MD 12/30/23 1634    Lorre Nick, MD 12/30/23 1706

## 2023-12-30 NOTE — ED Triage Notes (Signed)
Pt here with c/o left chronic wound infection to the foot , has been on antibiotics but th efoor has gotten worse , left leg is also swollen and red

## 2023-12-31 DIAGNOSIS — E11628 Type 2 diabetes mellitus with other skin complications: Secondary | ICD-10-CM

## 2023-12-31 DIAGNOSIS — L03116 Cellulitis of left lower limb: Secondary | ICD-10-CM | POA: Diagnosis not present

## 2023-12-31 DIAGNOSIS — L03119 Cellulitis of unspecified part of limb: Secondary | ICD-10-CM | POA: Diagnosis not present

## 2023-12-31 DIAGNOSIS — E11621 Type 2 diabetes mellitus with foot ulcer: Secondary | ICD-10-CM | POA: Diagnosis not present

## 2023-12-31 LAB — COMPREHENSIVE METABOLIC PANEL
ALT: 25 U/L (ref 0–44)
AST: 23 U/L (ref 15–41)
Albumin: 2.5 g/dL — ABNORMAL LOW (ref 3.5–5.0)
Alkaline Phosphatase: 55 U/L (ref 38–126)
Anion gap: 9 (ref 5–15)
BUN: 23 mg/dL (ref 8–23)
CO2: 24 mmol/L (ref 22–32)
Calcium: 9.2 mg/dL (ref 8.9–10.3)
Chloride: 97 mmol/L — ABNORMAL LOW (ref 98–111)
Creatinine, Ser: 0.94 mg/dL (ref 0.61–1.24)
GFR, Estimated: >60 mL/min (ref >60)
Glucose, Bld: 296 mg/dL — ABNORMAL HIGH (ref 70–99)
Potassium: 4.1 mmol/L (ref 3.5–5.1)
Sodium: 130 mmol/L — ABNORMAL LOW (ref 135–145)
Total Bilirubin: 0.6 mg/dL (ref 0.0–1.2)
Total Protein: 6.3 g/dL — ABNORMAL LOW (ref 6.5–8.1)

## 2023-12-31 LAB — CBC
HCT: 27.1 % — ABNORMAL LOW (ref 39.0–52.0)
Hemoglobin: 8.5 g/dL — ABNORMAL LOW (ref 13.0–17.0)
MCH: 25.4 pg — ABNORMAL LOW (ref 26.0–34.0)
MCHC: 31.4 g/dL (ref 30.0–36.0)
MCV: 80.9 fL (ref 80.0–100.0)
Platelets: 316 10*3/uL (ref 150–400)
RBC: 3.35 MIL/uL — ABNORMAL LOW (ref 4.22–5.81)
RDW: 14.2 % (ref 11.5–15.5)
WBC: 5.2 10*3/uL (ref 4.0–10.5)
nRBC: 0 % (ref 0.0–0.2)

## 2023-12-31 LAB — GLUCOSE, CAPILLARY
Glucose-Capillary: 290 mg/dL — ABNORMAL HIGH (ref 70–99)
Glucose-Capillary: 246 mg/dL — ABNORMAL HIGH (ref 70–99)
Glucose-Capillary: 230 mg/dL — ABNORMAL HIGH (ref 70–99)
Glucose-Capillary: 267 mg/dL — ABNORMAL HIGH (ref 70–99)

## 2023-12-31 LAB — HEMOGLOBIN A1C
Hgb A1c MFr Bld: 7.9 % — ABNORMAL HIGH (ref 4.8–5.6)
Mean Plasma Glucose: 180.03 mg/dL

## 2023-12-31 LAB — MRSA NEXT GEN BY PCR, NASAL: MRSA by PCR Next Gen: NOT DETECTED

## 2023-12-31 MED ORDER — LINEZOLID 600 MG PO TABS
600.0000 mg | ORAL_TABLET | Freq: Two times a day (BID) | ORAL | Status: DC
  Administered 2023-12-31 – 2024-01-01 (×3): 600 mg via ORAL
  Filled 2023-12-31 (×3): qty 1

## 2023-12-31 NOTE — Consult Note (Signed)
[facility]    Date of Admission:  12/30/2023     Reason for Consult: chronic diabetic foot ulcer/infection    Referring Provider: Elvera Lennox      Abx: 1/17-c ceftriaxone 1/17-c metronidazole  1/17 clindamycin       Outpatient amox-clav  Assessment: 68 yo male with dm2, pvd, retinopathy, chronic diabetic foot ulcer admitted 1/17 with left diabetic foot infection  Patient had refused bka in the past and still is reluctant  He has had this issue involving the calcaneous for at least 5 years. In 2020 has patial calcanectomy by dr Lajoyce Corners  He remains not ready for bka and wants to keep going as long as he can  He understands the likely there is no cure for this with abx alone and that we can only treat cellulitis when it occurs. 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   As of 12/31/23 he said the redness/pain is improving here  Plan: F/u blood cx I would not treat longer than 10 days for cellulitis. The chronic om of the left heel is likely real from imaging and wouldn't be amenable to abx at this time and might harbor MDRO given multiple abx in the past I have started linezolid on top of ceftriaxone and flagyl If bcx negative 1/20 would discharge home on oral abx to finish the 10 day course abx (doxy/augmentin) Continue outpatient wound care f/u -- recommend dr Lajoyce Corners who does wound care as well and would be able to help with bka when he decides Discussed with dr Elvera Lennox     ------------------------------------------------ Principal Problem:   Cellulitis in diabetic foot [facility])    HPI: [name] is a 68 y.o. male with dm2, pvd, retinopathy, chronic diabetic foot ulcer admitted 1/17 with left  diabetic foot infection  Patient had refused bka in the past and still is reluctant  He has had this issue involving the calcaneous for at least 5 years. In 2020 has patial calcanectomy by dr Lajoyce Corners  He remains not ready for bka and wants to keep going as long as he can  He has angiogram 02/2023 that showed patent lle vessels -- no intervention done   He has had multiple short courses of oral abx and only amox-clav the last few courses  At one time he had iv abx for 3-4 weeks   He came in due to worsening pain/swelling redness No fever, chill  Augmentin didn't help this time  Afebrile and no leukocytosis on presentation 1/17 admission bcx ngtd Cxr foot as below  No n/v/diarrhea at this time     History reviewed. No pertinent family history.  Social History   Tobacco Use   Smoking status: Never   Smokeless tobacco: Never  Vaping Use   Vaping status: Never Used  Substance Use Topics   Alcohol use: Yes    Comment: rarely   Drug use: Never    Allergies  Allergen Reactions   Bee Venom Anaphylaxis, Swelling and Rash         Cefepime Rash    Unclear if due to cefepime    Review of Systems: ROS All Other ROS was negative, except mentioned above   Past Medical History:  Diagnosis  Date   Diabetic foot ulcers (HCC)    DM2 (diabetes mellitus, type 2) (HCC)    HTN (hypertension)    Sleep apnea    No Cpap       Scheduled Meds:  aspirin EC  81 mg Oral Daily   atorvastatin  10 mg Oral QHS   enoxaparin (LOVENOX) injection  40 mg Subcutaneous Q24H   insulin aspart  0-20 Units Subcutaneous TID WC   insulin glargine-yfgn  50 Units Subcutaneous QHS   insulin glargine-yfgn  80 Units Subcutaneous Daily   losartan  100 mg Oral Daily   propranolol  20 mg Oral BID   Continuous Infusions:  cefTRIAXone (ROCEPHIN)  IV Stopped (12/30/23 2031)   metronidazole 500 mg (12/31/23 0851)   PRN Meds:.acetaminophen **OR** acetaminophen   OBJECTIVE: Blood pressure 123/61,  pulse 77, temperature 98.7 F (37.1 C), temperature source Oral, resp. rate 15, height 6\' 2"  (1.88 m), weight 112.9 kg, SpO2 97%.  Physical Exam  General/constitutional: no distress, pleasant HEENT: Normocephalic, PER, Conj Clear, EOMI, Oropharynx clear Neck supple CV: rrr no mrg Lungs: clear to auscultation, normal respiratory effort Abd: Soft, Nontender Ext: no edema Skin: No Rash Neuro: nonfocal MSK: left foot in dressing that is c/d; reviewed picture of the wound on admission (dorsal ulcers/cellulitis change and hell large ulcer)   Lab Results Lab Results  Component Value Date   WBC 5.2 12/31/2023   HGB 8.5 (L) 12/31/2023   HCT 27.1 (L) 12/31/2023   MCV 80.9 12/31/2023   PLT 316 12/31/2023    Lab Results  Component Value Date   CREATININE 0.94 12/31/2023   BUN 23 12/31/2023   NA 130 (L) 12/31/2023   K 4.1 12/31/2023   CL 97 (L) 12/31/2023   CO2 24 12/31/2023    Lab Results  Component Value Date   ALT 25 12/31/2023   AST 23 12/31/2023   ALKPHOS 55 12/31/2023   BILITOT 0.6 12/31/2023      Microbiology: Recent Results (from the past 240 hours)  Culture, blood (Routine X 2) w Reflex to ID Panel     Status: None (Preliminary result)   Collection Time: 12/30/23  4:20 PM   Specimen: BLOOD  Result Value Ref Range Status   Specimen Description   Final    BLOOD LEFT ANTECUBITAL Performed at [facility], [address], [place], [place] [address]    Special Requests   Final    BOTTLES DRAWN AEROBIC AND ANAEROBIC Blood Culture adequate volume Performed at [facility], [address], [place], [place] [address]    Culture   Final    NO GROWTH < 24 HOURS Performed at [facility], [address], [place], [place] [address]    Report Status PENDING  Incomplete  Culture, blood (Routine X 2) w Reflex to ID Panel     Status: None (Preliminary result)   Collection Time: 12/30/23 10:26 PM   Specimen: BLOOD  Result Value Ref Range  Status   Specimen Description   Final    BLOOD BLOOD LEFT HAND Performed at [facility], [address], [place], [place] [address]    Special Requests   Final    BOTTLES DRAWN AEROBIC AND ANAEROBIC Blood Culture adequate volume Performed at [facility], [address], [place], [place] [address]    Culture   Final    NO GROWTH < 12 HOURS Performed at [facility], [address],  [place], [place] [address]    Report Status PENDING  Incomplete  MRSA Next Gen by PCR, Nasal     Status: None   Collection Time: 12/30/23 11:48 PM   Specimen: Nasal Mucosa; Nasal Swab  Result Value Ref Range Status   MRSA by PCR Next Gen NOT DETECTED NOT DETECTED Final    Comment: (NOTE) The GeneXpert MRSA Assay (FDA approved for NASAL specimens only), is one component of a comprehensive MRSA colonization surveillance program. It is not intended to diagnose MRSA infection nor to guide or monitor treatment for MRSA infections. Test performance is not FDA approved in patients less than 35 years old. Performed at [facility], [address] [place], [place] [address]      Serology:    Imaging: If present, new imagings (plain films, ct scans, and mri) have been personally visualized and interpreted; radiology reports have been reviewed. Decision making incorporated into the Impression / Recommendations.  1/17 cxr foot left Suspicion om of the calcaneous  Raymondo Band, MD [facility] [phone] pager    12/31/2023, 12:36 PM

## 2023-12-31 NOTE — Plan of Care (Signed)
  Problem: Metabolic: Goal: Ability to maintain appropriate glucose levels will improve Outcome: Progressing   Problem: Education: Goal: Knowledge of General Education information will improve Description: Including pain rating scale, medication(s)/side effects and non-pharmacologic comfort measures Outcome: Progressing   Problem: Elimination: Goal: Will not experience complications related to urinary retention Outcome: Progressing   Problem: Pain Managment: Goal: General experience of comfort will improve and/or be controlled Outcome: Progressing   Problem: Safety: Goal: Ability to remain free from injury will improve Outcome: Progressing

## 2023-12-31 NOTE — Progress Notes (Signed)
PROGRESS NOTE  [name] ZOX:[id number] DOB: Oct 12, 1956 DOA: 12/30/2023 PCP: [name], MD   LOS: 0 days   Brief Narrative / Interim history: 68 year old male with poorly controlled DM2, neuropathy and retinopathy, chronic diabetic foot ulcer in the left lower extremity, OSA not on CPAP comes into the hospital with cellulitis of the left diabetic foot, worsening ulceration and increased redness and swelling.  He was prescribed Augmentin for a few days without relief.  This has happened in the past, and has been having an ulcer on the left lower extremity for the past 3 years with intermittent hospitalizations and acute on chronic osteomyelitis.  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  Subjective / 24h Interval events: He is doing well this morning, denies any fever or chills.  Assesement and Plan: Principal problem Left lower extremity cellulitis, diabetic foot ulcer, chronic osteomyelitis -BKA was offered in the past, however patient declines that.  For now, continue antibiotics, obtain an MRI to further evaluate how deep the infection is -ID consulted, appreciate input. -I will not consult orthopedic surgery for now given that he does not want BKA.  Active problems PAD-vascular surgery evaluated patient March 2024, underwent an aortogram and felt to be optimized from a vascular standpoint.  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  Continue glargine as well as sliding scale  Lab  Results  Component Value Date   HGBA1C 7.9 (H) 12/30/2023   CBG (last 3)  Recent Labs    12/30/23 2237 12/31/23 0754  GLUCAP 295* 290*    Scheduled Meds:  aspirin EC  81 mg Oral Daily   atorvastatin  10 mg Oral QHS   enoxaparin (LOVENOX) injection  40 mg Subcutaneous Q24H   insulin aspart  0-20 Units Subcutaneous TID WC   insulin glargine-yfgn  50 Units Subcutaneous QHS   insulin glargine-yfgn  80 Units Subcutaneous Daily   losartan  100 mg Oral Daily   propranolol  20 mg Oral BID   Continuous Infusions:  cefTRIAXone (ROCEPHIN)  IV Stopped (12/30/23 2031)   metronidazole 500 mg (12/31/23 0851)   PRN Meds:.acetaminophen **OR** acetaminophen  Current Outpatient Medications  Medication Instructions   acetaminophen (TYLENOL) 650 mg, Oral, Every 6 hours PRN   amoxicillin-clavulanate (AUGMENTIN) 875-125 MG tablet 1 tablet, Oral, 2 times daily   aspirin EC 81 mg, Oral, Daily   atorvastatin (LIPITOR) 10 mg, Oral, Daily at bedtime   B-D UF III MINI PEN NEEDLES 31G X 5 MM MISC USE AS DIRECTED UP TO 3 TIMES DAILY   Berberine Chloride 500 MG CAPS 1 capsule, Oral, Daily   bismuth subsalicylate (PEPTO BISMOL) 262 MG/15ML suspension 30 mLs, Oral, Every 6 hours PRN   Continuous Blood Gluc Sensor (FREESTYLE LIBRE 14 DAY SENSOR) MISC Use to check blood glucose   cyanocobalamin (VITAMIN B12) 1,000 mcg, Oral, Daily   EPINEPHrine (EPIPEN 2-PAK) 0.3 mg, As needed   furosemide (LASIX) 40 mg, Daily   losartan (COZAAR) 100 mg, Oral, Daily   NovoLOG FlexPen 7-10 Units, Subcutaneous, 3 times daily with meals, Sliding scale    oxyCODONE (  OXY IR/ROXICODONE) 5 mg, Oral, Every 4 hours PRN   potassium chloride (K-DUR) 10 MEQ tablet 10 mEq, Oral, Daily   propranolol (INDERAL) 20 mg, Oral, 2 times daily   Toujeo Max SoloStar 60-100 Units, Subcutaneous, 2 times daily, Inject 100 units into the skin in the morning and then 60 units into the skin in the evening.   Vitamin D3 2,000 Units, Oral, Daily    Zinc 50 MG TABS 1 tablet, Oral, Daily    Diet Orders (From admission, onward)     Start     Ordered   12/30/23 1903  Diet Carb Modified Fluid consistency: Thin; Room service appropriate? Yes  Diet effective now       Question Answer Comment  Diet-HS Snack? Nothing   Calorie Level Medium 1600-2000   Fluid consistency: Thin   Room service appropriate? Yes      12/30/23 1905            DVT prophylaxis: enoxaparin (LOVENOX) injection 40 mg Start: 12/30/23 2200   Lab Results  Component Value Date   PLT 316 12/31/2023      Code Status: Full Code  Family Communication: no family at bedside   Status is: Observation The patient will require care spanning > 2 midnights and should be moved to inpatient because: IV antibiotics, MRI, ID consult  Level of care: Med-Surg  Consultants:  Infectious disease  Objective: Vitals:   12/30/23 2130 12/30/23 2230 12/31/23 0028 12/31/23 0641  BP: (!) 169/77 (!) 148/69 (!) 119/59 (!) 116/57  Pulse: 89 93 83 79  Resp: 18 17 18 18   Temp: 98.5 F (36.9 C) 99.6 F (37.6 C) 98.8 F (37.1 C) 99.7 F (37.6 C)  TempSrc:  Oral  Oral  SpO2: 97% 98% 95% 96%  Weight:  112.9 kg    Height:  6\' 2"  (1.88 m)      Intake/Output Summary (Last 24 hours) at 12/31/2023 0929 Last data filed at 12/31/2023 0600 Gross per 24 hour  Intake 490 ml  Output 1000 ml  Net -510 ml   Wt Readings from Last 3 Encounters:  12/30/23 112.9 kg  05/24/23 113.4 kg  04/12/23 112.9 kg    Examination:  Constitutional: NAD Eyes: no scleral icterus ENMT: Mucous membranes are moist.  Neck: normal, supple Respiratory: clear to auscultation bilaterally, no wheezing, no crackles. Normal respiratory effort. Cardiovascular: Regular rate and rhythm, no murmurs / rubs / gallops. No LE edema.  Abdomen: non distended, no tenderness. Bowel sounds positive.  Musculoskeletal: no clubbing / cyanosis.   Data Reviewed: I have independently reviewed following labs and imaging  studies  CBC Recent Labs  Lab 12/30/23 1404 12/31/23 0400  WBC 8.3 5.2  HGB 10.7* 8.5*  HCT 33.3* 27.1*  PLT 424* 316  MCV 79.3* 80.9  MCH 25.5* 25.4*  MCHC 32.1 31.4  RDW 14.3 14.2  LYMPHSABS 0.8  --   MONOABS 0.6  --   EOSABS 0.1  --   BASOSABS 0.0  --     Recent Labs  Lab 12/30/23 1404 12/30/23 1648 12/30/23 1918 12/30/23 1930 12/31/23 0400  NA 131*  --   --   --  130*  K 5.1  --   --   --  4.1  CL 95*  --   --   --  97*  CO2 26  --   --   --  24  GLUCOSE 274*  --   --   --  296*  BUN 24*  --   --   --  23  CREATININE 1.01  --   --   --  0.94  CALCIUM 9.9  --   --   --  9.2  AST 17  --   --   --  23  ALT 22  --   --   --  25  ALKPHOS 66  --   --   --  55  BILITOT 1.0  --   --   --  0.6  ALBUMIN 2.9*  --   --   --  2.5*  LATICACIDVEN  --  0.9  --  0.7  --   HGBA1C  --   --  7.9*  --   --     ------------------------------------------------------------------------------------------------------------------ No results for input(s): "CHOL", "HDL", "LDLCALC", "TRIG", "CHOLHDL", "LDLDIRECT" in the last 72 hours.  Lab Results  Component Value Date   HGBA1C 7.9 (H) 12/30/2023   ------------------------------------------------------------------------------------------------------------------ No results for input(s): "TSH", "T4TOTAL", "T3FREE", "THYROIDAB" in the last 72 hours.  Invalid input(s): "FREET3"  Cardiac Enzymes No results for input(s): "CKMB", "TROPONINI", "MYOGLOBIN" in the last 168 hours.  Invalid input(s): "CK" ------------------------------------------------------------------------------------------------------------------ No results found for: "BNP"  CBG: Recent Labs  Lab 12/30/23 2237 12/31/23 0754  GLUCAP 295* 290*    Recent Results (from the past 240 hours)  Culture, blood (Routine X 2) w Reflex to ID Panel     Status: None (Preliminary result)   Collection Time: 12/30/23  4:20 PM   Specimen: BLOOD  Result Value Ref Range Status    Specimen Description   Final    BLOOD LEFT ANTECUBITAL Performed at [facility], [address] [place], [place] [address]    Special Requests   Final    BOTTLES DRAWN AEROBIC AND ANAEROBIC Blood Culture adequate volume Performed at [facility], [address] [place], [place] [address]    Culture   Final    NO GROWTH < 24 HOURS Performed at [facility], [address] [place], [place] [address]    Report Status PENDING  Incomplete  Culture, blood (Routine X 2) w Reflex to ID Panel     Status: None (Preliminary result)   Collection Time: 12/30/23 10:26 PM   Specimen: BLOOD  Result Value Ref Range Status   Specimen Description   Final    BLOOD BLOOD LEFT HAND Performed at [facility], [address] [place], [place] [address]    Special Requests   Final    BOTTLES DRAWN AEROBIC AND ANAEROBIC Blood Culture adequate volume Performed at [facility], [address] [place], [place] [address]    Culture   Final    NO GROWTH < 12 HOURS Performed at [facility], [address], [place], [place] [address]    Report Status PENDING  Incomplete  MRSA Next Gen by PCR, Nasal     Status: None   Collection Time: 12/30/23 11:48 PM   Specimen: Nasal Mucosa; Nasal Swab  Result Value Ref Range Status   MRSA by PCR Next Gen NOT DETECTED NOT DETECTED Final    Comment: (NOTE) The GeneXpert MRSA Assay (FDA approved for NASAL specimens only), is one component of a comprehensive MRSA colonization surveillance program. It is not intended to diagnose MRSA infection nor to guide or monitor treatment for MRSA infections. Test performance is not FDA approved in patients less than 69 years old. Performed at [facility], [address] [place], [place] [address]  Radiology Studies: DG Foot Complete Left Result Date: 12/30/2023 CLINICAL DATA:  Chronic left foot wound EXAM: LEFT FOOT -  COMPLETE 3+ VIEW COMPARISON:  02/21/2023 FINDINGS: Large chronic ulceration at the plantar aspect of the left heel. Chronic osseous remodeling of the calcaneal body with increased sclerosis. There is irregularity of the calcaneal cortex underlying the anterior margin of the ulceration site suspicious for acute osteomyelitis. Bones are demineralized. No acute fracture or dislocation. Diffuse soft tissue swelling. 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 Electronically Signed   By: [name] D.O.   On: 12/30/2023 16:16     Pamella Pert, MD, PhD [facility]  Between 7 am - 7 pm I am available, please contact me via Amion (for emergencies) or Securechat (non urgent messages)  Between 7 pm - 7 am I am not available, please contact night coverage MD/APP via Amion

## 2023-12-31 NOTE — Plan of Care (Signed)
  Problem: Coping: Goal: Ability to adjust to condition or change in health will improve Outcome: Progressing   Problem: Fluid Volume: Goal: Ability to maintain a balanced intake and output will improve Outcome: Progressing   Problem: Health Behavior/Discharge Planning: Goal: Ability to manage health-related needs will improve Outcome: Progressing   Problem: Metabolic: Goal: Ability to maintain appropriate glucose levels will improve Outcome: Progressing   Problem: Nutritional: Goal: Maintenance of adequate nutrition will improve Outcome: Progressing   Problem: Skin Integrity: Goal: Risk for impaired skin integrity will decrease Outcome: Progressing   Problem: Tissue Perfusion: Goal: Adequacy of tissue perfusion will improve Outcome: Progressing   Problem: Clinical Measurements: Goal: Diagnostic test results will improve Outcome: Progressing Goal: Respiratory complications will improve Outcome: Progressing Goal: Cardiovascular complication will be avoided Outcome: Progressing   Problem: Activity: Goal: Risk for activity intolerance will decrease Outcome: Progressing   Problem: Nutrition: Goal: Adequate nutrition will be maintained Outcome: Progressing   Problem: Elimination: Goal: Will not experience complications related to bowel motility Outcome: Progressing Goal: Will not experience complications related to urinary retention Outcome: Progressing   Problem: Pain Managment: Goal: General experience of comfort will improve and/or be controlled Outcome: Progressing   Problem: Safety: Goal: Ability to remain free from injury will improve Outcome: Progressing   Problem: Skin Integrity: Goal: Risk for impaired skin integrity will decrease Outcome: Progressing

## 2024-01-01 ENCOUNTER — Observation Stay (HOSPITAL_COMMUNITY): Payer: Medicare (Managed Care)

## 2024-01-01 DIAGNOSIS — L03119 Cellulitis of unspecified part of limb: Secondary | ICD-10-CM | POA: Diagnosis not present

## 2024-01-01 DIAGNOSIS — E11621 Type 2 diabetes mellitus with foot ulcer: Secondary | ICD-10-CM | POA: Diagnosis not present

## 2024-01-01 DIAGNOSIS — E11628 Type 2 diabetes mellitus with other skin complications: Secondary | ICD-10-CM | POA: Diagnosis not present

## 2024-01-01 LAB — GLUCOSE, CAPILLARY
Glucose-Capillary: 192 mg/dL — ABNORMAL HIGH (ref 70–99)
Glucose-Capillary: 263 mg/dL — ABNORMAL HIGH (ref 70–99)
Glucose-Capillary: 261 mg/dL — ABNORMAL HIGH (ref 70–99)
Glucose-Capillary: 267 mg/dL — ABNORMAL HIGH (ref 70–99)

## 2024-01-01 LAB — CBC
HCT: 26.7 % — ABNORMAL LOW (ref 39.0–52.0)
Hemoglobin: 8.4 g/dL — ABNORMAL LOW (ref 13.0–17.0)
MCH: 25.2 pg — ABNORMAL LOW (ref 26.0–34.0)
MCHC: 31.5 g/dL (ref 30.0–36.0)
MCV: 80.2 fL (ref 80.0–100.0)
Platelets: 329 10*3/uL (ref 150–400)
RBC: 3.33 MIL/uL — ABNORMAL LOW (ref 4.22–5.81)
RDW: 14.3 % (ref 11.5–15.5)
WBC: 5.4 10*3/uL (ref 4.0–10.5)
nRBC: 0 % (ref 0.0–0.2)

## 2024-01-01 LAB — COMPREHENSIVE METABOLIC PANEL
ALT: 24 U/L (ref 0–44)
AST: 15 U/L (ref 15–41)
Albumin: 2.4 g/dL — ABNORMAL LOW (ref 3.5–5.0)
Alkaline Phosphatase: 52 U/L (ref 38–126)
Anion gap: 8 (ref 5–15)
BUN: 25 mg/dL — ABNORMAL HIGH (ref 8–23)
CO2: 26 mmol/L (ref 22–32)
Calcium: 9.6 mg/dL (ref 8.9–10.3)
Chloride: 103 mmol/L (ref 98–111)
Creatinine, Ser: 0.81 mg/dL (ref 0.61–1.24)
GFR, Estimated: >60 mL/min (ref >60)
Glucose, Bld: 244 mg/dL — ABNORMAL HIGH (ref 70–99)
Potassium: 4 mmol/L (ref 3.5–5.1)
Sodium: 137 mmol/L (ref 135–145)
Total Bilirubin: 0.4 mg/dL (ref 0.0–1.2)
Total Protein: 6.2 g/dL — ABNORMAL LOW (ref 6.5–8.1)

## 2024-01-01 LAB — PHOSPHORUS: Phosphorus: 3 mg/dL (ref 2.5–4.6)

## 2024-01-01 LAB — MAGNESIUM: Magnesium: 1.8 mg/dL (ref 1.7–2.4)

## 2024-01-01 MED ORDER — AMOXICILLIN-POT CLAVULANATE 875-125 MG PO TABS
1.0000 | ORAL_TABLET | Freq: Two times a day (BID) | ORAL | Status: DC
  Administered 2024-01-01 – 2024-01-02 (×2): 1 via ORAL
  Filled 2024-01-01 (×2): qty 1

## 2024-01-01 MED ORDER — DOXYCYCLINE HYCLATE 100 MG PO TABS
100.0000 mg | ORAL_TABLET | Freq: Two times a day (BID) | ORAL | Status: DC
  Administered 2024-01-01 – 2024-01-02 (×3): 100 mg via ORAL
  Filled 2024-01-01 (×3): qty 1

## 2024-01-01 NOTE — Plan of Care (Signed)
Id brief note  No issue overnight   Vitals reviewed Labs reviewed    A/p Diabetic foot ulcer with infection Known chronic heel om  Change abx to doxy/augmentin today Finish 7 more days of these abx I do not feel need for mri Ok to discharge from id standpoint Discussed with primary team

## 2024-01-01 NOTE — Care Management Obs Status (Signed)
MEDICARE OBSERVATION STATUS NOTIFICATION   Patient Details  Name: [name] MRN: [id number] Date of Birth: 05/04/1956   Medicare Observation Status Notification Given:   Yes    Maryjean Ka, LCSW 01/01/2024, 6:18 PM

## 2024-01-01 NOTE — Consult Note (Signed)
WOC Nurse Consult Note: Reason for Consult: foot wound Patient followed by [facility]; left foot; present over 3 years. Recommended amputation. Calcaneous +osteomyelitis  Wound type: Neuropathic wounds left heel; dorsal foot Pressure Injury POA:NA Measurement: left heel at visit 1/6; 5.1cm x 5cm x 1.5cm with tunneling, dorsal foot essentially re-epithelialized , but still appears irritated  Wound UXL:KGMW heel; non granular with wet hyperkeratotic calloused peri wound; dorsal foot clean Drainage (amount, consistency, odor) see nursing flow sheet Periwound:see above  Dressing procedure/placement/frequency: Cleanse left heel and left dorsal foot wound with Ree Kida Hart [place] [phone]), pat dry.  Apply silver hydrofiber (Aquacel Ag+) Lawson # [id number], pack heel ulcer making sure to pack into tunneled areas, top with ABD, secure with kerlix. Change daily   Consider referral to orthopedics to have patient re-consider amputation FU with wound care center as scheduled   Re consult if needed, will not follow at this time. Thanks  Tilley Faeth [employer], RN,CWOCN, CNS, CWON-AP [phone])

## 2024-01-01 NOTE — Progress Notes (Signed)
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  He was prescribed Augmentin for a few days without relief.  This has happened in the past, and has been having an ulcer on the left lower extremity for the past 3 years with intermittent hospitalizations and acute on chronic osteomyelitis.  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  Subjective / 24h Interval events: Feeling better, feels like his cellulitis is better.  Has less foot pain, able to ambulate better  Assesement and Plan: Principal problem Left lower extremity cellulitis, diabetic foot ulcer, chronic osteomyelitis -BKA was offered in the past, however patient declines that.  For now, continue antibiotics, obtain an MRI to further evaluate how deep the infection is -ID consulted, appreciate input.  For now continue linezolid, ceftriaxone and metronidazole -MRI pending, machines are down at [facility], hopefully will be done tomorrow -I will not consult orthopedic surgery for now given that he does not want BKA.  Active problems PAD-vascular surgery evaluated patient March 2024, underwent an aortogram and felt to be optimized from a vascular standpoint.  Hyponatremia-mild, monitor, sodium normalized with fluids  Anemia of chronic disease-hemoglobin 8.4, overall stable, no bleeding  Hyperlipidemia-continue statin  Essential hypertension-continue losartan, propranolol, blood pressure  controlled  Obesity, class I-BMI 31.9, he would benefit from weight loss  DM2, poorly controlled, with hyperglycemia-A1c 7.9.  Continue glargine as well as sliding scale  Lab Results  Component Value Date   HGBA1C 7.9 (H) 12/30/2023   CBG (last 3)  Recent Labs    12/31/23 1659 12/31/23 2231 01/01/24 0814  GLUCAP 230* 267* 192*    Scheduled Meds:  aspirin EC  81 mg Oral Daily   atorvastatin  10 mg Oral QHS   enoxaparin (LOVENOX) injection  40 mg Subcutaneous Q24H   insulin aspart  0-20 Units Subcutaneous TID WC   insulin glargine-yfgn  50 Units Subcutaneous QHS   insulin glargine-yfgn  80 Units Subcutaneous Daily   linezolid  600 mg Oral Q12H   losartan  100 mg Oral Daily   propranolol  20 mg Oral BID   Continuous Infusions:  cefTRIAXone (ROCEPHIN)  IV 2 g (12/31/23 2121)   metronidazole 500 mg (01/01/24 0907)   PRN Meds:.acetaminophen **OR** acetaminophen  Current Outpatient Medications  Medication Instructions   acetaminophen (TYLENOL) 650 mg, Oral, Every 6 hours PRN   amoxicillin-clavulanate (AUGMENTIN) 875-125 MG tablet 1 tablet, Oral, 2 times daily   aspirin EC 81 mg, Oral, Daily   atorvastatin (LIPITOR) 10 mg, Oral, Daily at bedtime   B-D UF III MINI PEN NEEDLES 31G X 5 MM MISC USE AS DIRECTED UP TO 3 TIMES DAILY   Berberine Chloride 500 MG CAPS 1 capsule, Oral, Daily   bismuth subsalicylate (PEPTO BISMOL) 262 MG/15ML suspension 30 mLs, Oral, Every 6 hours PRN   Continuous Blood Gluc Sensor (FREESTYLE LIBRE 14 DAY SENSOR) MISC Use to check blood glucose   cyanocobalamin (VITAMIN B12) 1,000 mcg, Oral, Daily   EPINEPHrine (EPIPEN 2-PAK)  0.3 mg, As needed   furosemide (LASIX) 40 mg, Daily   losartan (COZAAR) 100 mg, Oral, Daily   NovoLOG FlexPen 7-10 Units, Subcutaneous, 3 times daily with meals, Sliding scale    oxyCODONE (OXY IR/ROXICODONE) 5 mg, Oral, Every 4 hours PRN   potassium chloride (K-DUR) 10 MEQ tablet 10 mEq, Oral, Daily   propranolol (INDERAL) 20  mg, Oral, 2 times daily   Toujeo Max SoloStar 60-100 Units, Subcutaneous, 2 times daily, Inject 100 units into the skin in the morning and then 60 units into the skin in the evening.   Vitamin D3 2,000 Units, Oral, Daily   Zinc 50 MG TABS 1 tablet, Oral, Daily    Diet Orders (From admission, onward)     Start     Ordered   12/30/23 1903  Diet Carb Modified Fluid consistency: Thin; Room service appropriate? Yes  Diet effective now       Question Answer Comment  Diet-HS Snack? Nothing   Calorie Level Medium 1600-2000   Fluid consistency: Thin   Room service appropriate? Yes      12/30/23 1905            DVT prophylaxis: enoxaparin (LOVENOX) injection 40 mg Start: 12/30/23 2200   Lab Results  Component Value Date   PLT 329 01/01/2024      Code Status: Full Code  Family Communication: no family at bedside   Status is: Observation The patient will require care spanning > 2 midnights and should be moved to inpatient because: IV antibiotics, MRI, ID consult  Level of care: Med-Surg  Consultants:  Infectious disease  Objective: Vitals:   12/31/23 2233 12/31/23 2235 01/01/24 0237 01/01/24 0613  BP: (!) 123/44 (!) 123/44 (!) 121/50 135/66  Pulse: 78 78 76 71  Resp: 18  16 18   Temp: 98.6 F (37 C)  99.1 F (37.3 C) 97.9 F (36.6 C)  TempSrc: Oral     SpO2: 94%  94% 98%  Weight:      Height:        Intake/Output Summary (Last 24 hours) at 01/01/2024 0954 Last data filed at 01/01/2024 0925 Gross per 24 hour  Intake 1200 ml  Output 1100 ml  Net 100 ml   Wt Readings from Last 3 Encounters:  12/30/23 112.9 kg  05/24/23 113.4 kg  04/12/23 112.9 kg    Examination:  Constitutional: NAD Eyes: lids and conjunctivae normal, no scleral icterus ENMT: mmm Neck: normal, supple Respiratory: clear to auscultation bilaterally, no wheezing, no crackles. Normal respiratory effort.  Cardiovascular: Regular rate and rhythm, no murmurs / rubs / gallops. No LE  edema. Abdomen: soft, no distention, no tenderness. Bowel sounds positive.    Data Reviewed: I have independently reviewed following labs and imaging studies  CBC Recent Labs  Lab 12/30/23 1404 12/31/23 0400 01/01/24 0319  WBC 8.3 5.2 5.4  HGB 10.7* 8.5* 8.4*  HCT 33.3* 27.1* 26.7*  PLT 424* 316 329  MCV 79.3* 80.9 80.2  MCH 25.5* 25.4* 25.2*  MCHC 32.1 31.4 31.5  RDW 14.3 14.2 14.3  LYMPHSABS 0.8  --   --   MONOABS 0.6  --   --   EOSABS 0.1  --   --   BASOSABS 0.0  --   --     Recent Labs  Lab 12/30/23 1404 12/30/23 1648 12/30/23 1918 12/30/23 1930 12/31/23 0400 01/01/24 0319  NA 131*  --   --   --  130* 137  K 5.1  --   --   --  4.1 4.0  CL 95*  --   --   --  97* 103  CO2 26  --   --   --  24 26  GLUCOSE 274*  --   --   --  296* 244*  BUN 24*  --   --   --  23 25*  CREATININE 1.01  --   --   --  0.94 0.81  CALCIUM 9.9  --   --   --  9.2 9.6  AST 17  --   --   --  23 15  ALT 22  --   --   --  25 24  ALKPHOS 66  --   --   --  55 52  BILITOT 1.0  --   --   --  0.6 0.4  ALBUMIN 2.9*  --   --   --  2.5* 2.4*  MG  --   --   --   --   --  1.8  LATICACIDVEN  --  0.9  --  0.7  --   --   HGBA1C  --   --  7.9*  --   --   --     ------------------------------------------------------------------------------------------------------------------ No results for input(s): "CHOL", "HDL", "LDLCALC", "TRIG", "CHOLHDL", "LDLDIRECT" in the last 72 hours.  Lab Results  Component Value Date   HGBA1C 7.9 (H) 12/30/2023   ------------------------------------------------------------------------------------------------------------------ No results for input(s): "TSH", "T4TOTAL", "T3FREE", "THYROIDAB" in the last 72 hours.  Invalid input(s): "FREET3"  Cardiac Enzymes No results for input(s): "CKMB", "TROPONINI", "MYOGLOBIN" in the last 168 hours.  Invalid input(s):  "CK" ------------------------------------------------------------------------------------------------------------------ No results found for: "BNP"  CBG: Recent Labs  Lab 12/31/23 0754 12/31/23 1222 12/31/23 1659 12/31/23 2231 01/01/24 0814  GLUCAP 290* 246* 230* 267* 192*    Recent Results (from the past 240 hours)  Culture, blood (Routine X 2) w Reflex to ID Panel     Status: None (Preliminary result)   Collection Time: 12/30/23  4:20 PM   Specimen: BLOOD  Result Value Ref Range Status   Specimen Description   Final    BLOOD LEFT ANTECUBITAL Performed at [facility], [address] [place], [place] [address]    Special Requests   Final    BOTTLES DRAWN AEROBIC AND ANAEROBIC Blood Culture adequate volume Performed at [facility], [address], [place], [place] [address]    Culture   Final    NO GROWTH 2 DAYS Performed at [facility], [address], [place], [place] [address]    Report Status PENDING  Incomplete  Culture, blood (Routine X 2) w Reflex to ID Panel     Status: None (Preliminary result)   Collection Time: 12/30/23 10:26 PM   Specimen: BLOOD LEFT HAND  Result Value Ref Range Status   Specimen Description   Final    BLOOD LEFT HAND Performed at [facility], [address], [place], [place] [address]    Special Requests   Final    BOTTLES DRAWN AEROBIC AND ANAEROBIC Blood Culture adequate volume Performed at [facility], [address] [place], [place] [address]    Culture   Final    NO GROWTH 2 DAYS Performed at [facility], [address], [place], [place] [address]    Report Status PENDING  Incomplete  MRSA Next Gen by PCR, Nasal     Status: None   Collection Time: 12/30/23  11:48 PM   Specimen: Nasal Mucosa; Nasal Swab  Result Value Ref Range Status   MRSA by PCR Next Gen NOT DETECTED NOT DETECTED Final    Comment: (NOTE) The GeneXpert MRSA Assay (FDA approved for NASAL  specimens only), is one component of a comprehensive MRSA colonization surveillance program. It is not intended to diagnose MRSA infection nor to guide or monitor treatment for MRSA infections. Test performance is not FDA approved in patients less than 59 years old. Performed at [facility], [address], [place], [place] [address]      Radiology Studies: No results found.    Pamella Pert, MD, PhD [facility]  Between 7 am - 7 pm I am available, please contact me via Amion (for emergencies) or Securechat (non urgent messages)  Between 7 pm - 7 am I am not available, please contact night coverage MD/APP via Amion

## 2024-01-02 ENCOUNTER — Other Ambulatory Visit (HOSPITAL_COMMUNITY): Payer: Self-pay

## 2024-01-02 ENCOUNTER — Ambulatory Visit (HOSPITAL_BASED_OUTPATIENT_CLINIC_OR_DEPARTMENT_OTHER): Payer: Medicare Other | Admitting: General Surgery

## 2024-01-02 DIAGNOSIS — L03119 Cellulitis of unspecified part of limb: Secondary | ICD-10-CM | POA: Diagnosis not present

## 2024-01-02 DIAGNOSIS — E11621 Type 2 diabetes mellitus with foot ulcer: Secondary | ICD-10-CM | POA: Diagnosis not present

## 2024-01-02 DIAGNOSIS — E11628 Type 2 diabetes mellitus with other skin complications: Secondary | ICD-10-CM | POA: Diagnosis not present

## 2024-01-02 LAB — GLUCOSE, CAPILLARY
Glucose-Capillary: 70 mg/dL (ref 70–99)
Glucose-Capillary: 154 mg/dL — ABNORMAL HIGH (ref 70–99)

## 2024-01-02 MED ORDER — AMOXICILLIN-POT CLAVULANATE 875-125 MG PO TABS
1.0000 | ORAL_TABLET | Freq: Two times a day (BID) | ORAL | 0 refills | Status: AC
  Filled 2024-01-02: qty 14, 7d supply, fill #0

## 2024-01-02 MED ORDER — DOXYCYCLINE HYCLATE 100 MG PO TABS
100.0000 mg | ORAL_TABLET | Freq: Two times a day (BID) | ORAL | 0 refills | Status: AC
  Filled 2024-01-02: qty 14, 7d supply, fill #0

## 2024-01-02 NOTE — Progress Notes (Signed)
Pt waiting on dressing supply for wound care prior to discharge

## 2024-01-02 NOTE — Progress Notes (Signed)
TOC discharge meds in a secure bag delivered to pt in room by this RN

## 2024-01-02 NOTE — Progress Notes (Signed)
AVS reviewed w/ pt who verbalized an understanding. No other questions at this time. Wound care done by primary nurse prior to d/c to home. PIV removed as noted. Pt dressing for d/c to home. Pt drove self to hospital .

## 2024-01-02 NOTE — Discharge Summary (Signed)
Physician Discharge Summary  [name] ZOX:[id number] DOB: July 13, 1956 DOA: 12/30/2023  PCP: [name], MD  Admit date: 12/30/2023 Discharge date: 01/02/2024  Admitted From: home Disposition:  home  Recommendations for Outpatient Follow-up:  Follow up with PCP in 1-2 weeks Follow-up with Dr. Lady Gary as scheduled  Home Health: none Equipment/Devices: none  Discharge Condition: stable CODE STATUS: Full code Diet Orders (From admission, onward)     Start     Ordered   12/30/23 1903  Diet Carb Modified Fluid consistency: Thin; Room service appropriate? Yes  Diet effective now       Question Answer Comment  Diet-HS Snack? Nothing   Calorie Level Medium 1600-2000   Fluid consistency: Thin   Room service appropriate? Yes      12/30/23 1905            HPI: Per admitting MD, [name] is a 68 y.o. 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 Patient reports that he has had a left diabetic heel ulcer and an ulcerated area on the dorsum of his left foot for quite a while.  He has been following wound care as an outpatient for a while now and was noting some improvement.  However, over the past few days he has noted increasing redness and swelling in his left foot.  He was prescribed Augmentin and took it for a few days without relief.  He denies any nausea, vomiting, fevers, chills, chest pain, palpitations, shortness of breath, abdominal pain, diarrhea, urinary changes.  He denies any purulent drainage from his wound. Per last wound care note on 1/6, Dr. Lady Gary notes that patient has had an ulcer on his heel for over 3 years.  He has been seen in multiple wound care clinics including [facility] and [facility] with the majority of physicians recommending BKA.   However, patient does not want to undergo BKA and would like to preserve his left lower extremity as long as possible.  Per Dr. America Brown note, the dorsal foot wound is basically epithelialized.  As far as the plantar heel wound, he has 2 tunnels, 1 of which probes to bone at the distal lateral aspect of his wound and the other probes just under the skin at the distal medial portion of his wound.  Of note, patient has undergone an angiogram with vascular surgery in March 2024 and thought to be optimized from a vascular standpoint.  Hospital Course / Discharge diagnoses: Principal problem Left lower extremity cellulitis, diabetic foot ulcer, chronic osteomyelitis -BKA was offered in the past, however patient declines that.  Infectious disease was consulted and followed patient while hospitalized.  He was initially maintained on IV antibiotics, cellulitis improved, he is feeling better and has less foot pain, and has increased mobility.  He will be transition to oral antibiotics and discharged home in stable condition.  An MRI of the foot felt to show chronic findings, and given no desire for amputation will treat cellulitis, and ID does not recommend any long-term antibiotics.   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  Continue home regimen  Sepsis ruled out   Discharge Instructions   Allergies as of 01/02/2024       Reactions   Bee Venom Anaphylaxis, Swelling, Rash      Cefepime Rash   Unclear if due to cefepime        Medication List     TAKE these  medications    acetaminophen 325 MG tablet Commonly known as: TYLENOL Take 650 mg by mouth every 6 (six) hours as needed for mild pain (pain score 1-3) or moderate pain (pain score 4-6).   amoxicillin-clavulanate 875-125 MG tablet Commonly known as: AUGMENTIN Take 1 tablet by mouth every 12 (twelve) hours for 7 days. What changed: when to take this   aspirin EC 81 MG tablet Take 81 mg by mouth daily.   atorvastatin 10 MG tablet Commonly known as: LIPITOR Take 10 mg by mouth at bedtime.   B-D UF III MINI PEN NEEDLES 31G X 5 MM Misc Generic drug: Insulin Pen Needle USE AS DIRECTED UP TO 3 TIMES DAILY   Berberine Chloride 500 MG Caps Take 1 capsule by mouth daily.   bismuth subsalicylate 262 MG/15ML suspension Commonly known as: PEPTO BISMOL Take 30 mLs by mouth every 6 (six) hours as needed for indigestion or diarrhea or loose stools.   cyanocobalamin 1000 MCG tablet Commonly known as: VITAMIN B12 Take 1,000 mcg by mouth daily.   doxycycline 100 MG tablet Commonly known as: VIBRA-TABS Take 1 tablet (100 mg total) by mouth every 12 (twelve) hours for 7 days.   EpiPen 2-Pak 0.3 mg/0.3 mL Soaj injection Generic drug: EPINEPHrine Inject 0.3 mg into the muscle as needed for anaphylaxis.   FreeStyle Libre 14 Day Sensor Misc Use to check blood glucose   furosemide 40 MG tablet Commonly known as: LASIX Take 40 mg by mouth daily.   losartan 100 MG tablet Commonly known as: COZAAR Take 100 mg by mouth daily.   NovoLOG FlexPen 100 UNIT/ML FlexPen Generic drug: insulin aspart Inject 7-10 Units into the skin 3 (three) times daily with meals. Sliding scale   oxyCODONE 5 MG immediate release tablet Commonly known as: Oxy IR/ROXICODONE Take 1 tablet (5 mg total) by mouth every 4 (four) hours as needed for moderate pain, severe pain or breakthrough pain.   potassium chloride 10 MEQ tablet Commonly known as: KLOR-CON Take 10 mEq by mouth daily.   propranolol 20 MG  tablet Commonly known as: INDERAL Take 20 mg by mouth 2 (two) times daily.   Toujeo Max SoloStar 300 UNIT/ML Solostar Pen Generic drug: insulin glargine (2 Unit Dial) Inject 60-100 Units into the skin in the morning and at bedtime. Inject 100 units into the skin in the morning and then 60 units into the skin in the evening.   Vitamin D3 50 MCG (2000 UT) capsule Take 2,000 Units by mouth daily.   Zinc 50 MG Tabs Take 1 tablet by mouth daily.       Consultations: ID  Procedures/Studies:  MR HEEL LEFT WO CONTRAST Result Date: 01/01/2024 CLINICAL DATA:  Nonhealing left heel ulcer EXAM: MR OF THE LEFT HEEL WITHOUT CONTRAST TECHNIQUE: Multiplanar, multisequence MR imaging of the left hindfoot was performed. No intravenous contrast was administered. COMPARISON:  X-ray 12/30/2023, MRI 08/19/2022 FINDINGS: Bones/Joint/Cartilage Significant bone loss of the posterior calcaneus underlying site of large chronic ulceration. Areas of more acute appearing osseous erosion (series 7, image 21). Extensive bone marrow edema  throughout the residual calcaneus with confluent low T1 marrow signal compatible with changes of acute on chronic osteomyelitis. Patchy marrow edema throughout the remaining bones of the midfoot and hindfoot. No additional sites of erosion or confluent low T1 marrow signal change. Moderate-sized posterior subtalar joint effusion and small tibiotalar joint effusion. Ligaments No acute abnormality. Muscles and Tendons Chronic denervation changes of the lower leg and foot musculature. Complete or near complete tear of the distal Achilles tendon, likely chronic given lack of peritendinous edema. Small volume tenosynovitis of the peroneal and posteromedial ankle tendons. Plantar fascia appears is proximally. Soft tissues Large deep ulceration at the plantar aspect of the left heel with ulcer base of closely approximating the underlying calcaneus. Generalized soft tissue swelling. No drainable  fluid collections. IMPRESSION: 1. Large deep ulceration at the plantar aspect of the left heel with ulcer base closely approximating the underlying calcaneus. Findings of acute on chronic osteomyelitis of the posterior calcaneus. 2. Patchy marrow edema throughout the remaining bones of the midfoot and hindfoot, favored reactive and/or stress related. 3. Moderate-sized posterior subtalar joint effusion and small tibiotalar joint effusion. These may be reactive. Septic arthritis not excluded, particularly involving the subtalar joint. 4. Complete or near-complete tear of the distal Achilles tendon, likely chronic. 5. Mild tenosynovitis of the peroneal and posteromedial ankle tendons. Electronically Signed   By: [name] D.O.   On: 01/01/2024 17:23   DG Foot Complete Left Result Date: 12/30/2023 CLINICAL DATA:  Chronic left foot wound EXAM: LEFT FOOT - COMPLETE 3+ VIEW COMPARISON:  02/21/2023 FINDINGS: Large chronic ulceration at the plantar aspect of the left heel. Chronic osseous remodeling of the calcaneal body with increased sclerosis. There is irregularity of the calcaneal cortex underlying the anterior margin of the ulceration site suspicious for acute osteomyelitis. Bones are demineralized. No acute fracture or dislocation. Diffuse soft tissue swelling. 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 Electronically Signed   By: [name] D.O.   On: 12/30/2023 16:16     Subjective: - no chest pain, shortness of breath, no abdominal pain, nausea or vomiting.   Discharge Exam: BP 119/69 (BP Location: Left Arm)   Pulse 78   Temp 98.7 F (37.1 C) (Oral)   Resp 16   Ht 6\' 2"  (1.88 m)   Wt 112.9 kg   SpO2 100%   BMI 31.96 kg/m   General: Pt is alert, awake, not in acute distress Extremities: no edema, no cyanosis    The results of significant diagnostics from  this hospitalization (including imaging, microbiology, ancillary and laboratory) are listed below for reference.     Microbiology: Recent Results (from the past 240 hours)  Culture, blood (Routine X 2) w Reflex to ID Panel     Status: None (Preliminary result)   Collection Time: 12/30/23  4:20 PM   Specimen: BLOOD  Result Value Ref Range Status   Specimen Description   Final    BLOOD LEFT ANTECUBITAL Performed at [facility], [address] [place], [place] [address]    Special Requests   Final    BOTTLES DRAWN AEROBIC AND ANAEROBIC Blood Culture adequate volume Performed at [facility], [address] [place], [place] [address]    Culture   Final    NO GROWTH 2 DAYS Performed at [facility], [address], [place], [place] [address]  Report Status PENDING  Incomplete  Culture, blood (Routine X 2) w Reflex to ID Panel     Status: None (Preliminary result)   Collection Time: 12/30/23 10:26 PM   Specimen: BLOOD LEFT HAND  Result Value Ref Range Status   Specimen Description   Final    BLOOD LEFT HAND Performed at [facility], [address] [place], [place] [address]    Special Requests   Final    BOTTLES DRAWN AEROBIC AND ANAEROBIC Blood Culture adequate volume Performed at [facility], [address], [place], [place] [address]    Culture   Final    NO GROWTH 2 DAYS Performed at [facility], [address] [place], [place] [address]    Report Status PENDING  Incomplete  MRSA Next Gen by PCR, Nasal     Status: None   Collection Time: 12/30/23 11:48 PM   Specimen: Nasal Mucosa; Nasal Swab  Result Value Ref Range Status   MRSA by PCR Next Gen NOT DETECTED NOT DETECTED Final    Comment: (NOTE) The GeneXpert MRSA Assay (FDA approved for NASAL specimens only), is one component of a comprehensive MRSA colonization surveillance program. It is not intended to diagnose MRSA infection nor to guide or  monitor treatment for MRSA infections. Test performance is not FDA approved in patients less than 41 years old. Performed at [facility], [address] [place], [place] [address]      Labs: Basic Metabolic Panel: Recent Labs  Lab 12/30/23 1404 12/31/23 0400 01/01/24 0319  NA 131* 130* 137  K 5.1 4.1 4.0  CL 95* 97* 103  CO2 26 24 26   GLUCOSE 274* 296* 244*  BUN 24* 23 25*  CREATININE 1.01 0.94 0.81  CALCIUM 9.9 9.2 9.6  MG  --   --  1.8  PHOS  --   --  3.0   Liver Function Tests: Recent Labs  Lab 12/30/23 1404 12/31/23 0400 01/01/24 0319  AST 17 23 15   ALT 22 25 24   ALKPHOS 66 55 52  BILITOT 1.0 0.6 0.4  PROT 7.6 6.3* 6.2*  ALBUMIN 2.9* 2.5* 2.4*   CBC: Recent Labs  Lab 12/30/23 1404 12/31/23 0400 01/01/24 0319  WBC 8.3 5.2 5.4  NEUTROABS 6.8  --   --   HGB 10.7* 8.5* 8.4*  HCT 33.3* 27.1* 26.7*  MCV 79.3* 80.9 80.2  PLT 424* 316 329   CBG: Recent Labs  Lab 01/01/24 0814 01/01/24 1135 01/01/24 1703 01/01/24 2122 01/02/24 0741  GLUCAP 192* 263* 261* 267* 70   Hgb A1c Recent Labs    12/30/23 1918  HGBA1C 7.9*   Lipid Profile No results for input(s): "CHOL", "HDL", "LDLCALC", "TRIG", "CHOLHDL", "LDLDIRECT" in the last 72 hours. Thyroid function studies No results for input(s): "TSH", "T4TOTAL", "T3FREE", "THYROIDAB" in the last 72 hours.  Invalid input(s): "FREET3" Urinalysis    Component Value Date/Time   COLORURINE YELLOW 06/09/2018 1757   APPEARANCEUR CLEAR 06/09/2018 1757   LABSPEC 1.017 06/09/2018 1757   PHURINE 5.0 06/09/2018 1757   GLUCOSEU >=500 (A) 06/09/2018 1757   HGBUR SMALL (A) 06/09/2018 1757   BILIRUBINUR NEGATIVE 06/09/2018 1757   KETONESUR NEGATIVE 06/09/2018 1757   PROTEINUR NEGATIVE 06/09/2018 1757   NITRITE NEGATIVE 06/09/2018 1757   LEUKOCYTESUR NEGATIVE 06/09/2018 1757    FURTHER DISCHARGE INSTRUCTIONS:   Get Medicines reviewed and adjusted: Please take all your medications with you  for your next visit with your Primary  MD   Laboratory/radiological data: Please request your Primary MD to go over all hospital tests and procedure/radiological results at the follow up, please ask your Primary MD to get all Hospital records sent to his/her office.   In some cases, they will be blood work, cultures and biopsy results pending at the time of your discharge. Please request that your primary care M.D. goes through all the records of your hospital data and follows up on these results.   Also Note the following: If you experience worsening of your admission symptoms, develop shortness of breath, life threatening emergency, suicidal or homicidal thoughts you must seek medical attention immediately by calling 911 or calling your MD immediately  if symptoms less severe.   You must read complete instructions/literature along with all the possible adverse reactions/side effects for all the Medicines you take and that have been prescribed to you. Take any new Medicines after you have completely understood and accpet all the possible adverse reactions/side effects.    Do not drive when taking Pain medications or sleeping medications (Benzodaizepines)   Do not take more than prescribed Pain, Sleep and Anxiety Medications. It is not advisable to combine anxiety,sleep and pain medications without talking with your primary care practitioner   Special Instructions: If you have smoked or chewed Tobacco  in the last 2 yrs please stop smoking, stop any regular Alcohol  and or any Recreational drug use.   Wear Seat belts while driving.   Please note: You were cared for by a hospitalist during your hospital stay. Once you are discharged, your primary care physician will handle any further medical issues. Please note that NO REFILLS for any discharge medications will be authorized once you are discharged, as it is imperative that you return to your primary care physician (or establish a relationship  with a primary care physician if you do not have one) for your post hospital discharge needs so that they can reassess your need for medications and monitor your lab values.  Time coordinating discharge: 35 minutes  SIGNED:  Pamella Pert, MD, PhD 01/02/2024, 9:52 AM

## 2024-01-02 NOTE — Progress Notes (Signed)
Patient sleeping at this time, no needs currently, will continue to monitor.

## 2024-01-02 NOTE — TOC Transition Note (Signed)
Transition of Care [facility]) - Discharge Note   Patient Details  Name: [name] MRN: [id number] Date of Birth: February 19, 1956  [facility] CM/SW Contact:  Howell Rucks, RN Phone Number: 01/02/2024, 12:49 PM   Clinical Narrative:  DC to home order. NCM met with pt at bedside to review for dc planning, pt reports he has an established PCP and pharmacy, no current home care services or home DME, pt reports he resides with his spouse who is currently ill, reports he has a friend who provides support. Pt reports she has his own transportation for dc. No TOC needs identified.    Final next level of care: Home/Self Care Barriers to Discharge: No Barriers Identified   Patient Goals and CMS Choice Patient states their goals for this hospitalization and ongoing recovery are:: return home          Discharge Placement                       Discharge Plan and Services Additional resources added to the After Visit Summary for                                       Social Drivers of Health (SDOH) Interventions SDOH Screenings   Food Insecurity: No Food Insecurity (12/30/2023)  Housing: Low Risk  (12/30/2023)  Transportation Needs: No Transportation Needs (12/30/2023)  Utilities: Not At Risk (12/30/2023)  Depression (PHQ2-9): High Risk (03/21/2023)  Financial Resource Strain: High Risk (03/08/2023)  Physical Activity: Inactive (03/16/2023)  Social Connections: Moderately Integrated (12/30/2023)  Stress: Stress Concern Present (03/16/2023)  Tobacco Use: Low Risk  (12/30/2023)     Readmission Risk Interventions     No data to display

## 2024-01-02 NOTE — Plan of Care (Signed)
  Problem: Coping: Goal: Ability to adjust to condition or change in health will improve Outcome: Progressing   Problem: Safety: Goal: Ability to remain free from injury will improve Outcome: Progressing   

## 2024-01-04 LAB — CULTURE, BLOOD (ROUTINE X 2)
Culture: NO GROWTH
Special Requests: ADEQUATE
Special Requests: ADEQUATE

## 2024-01-16 ENCOUNTER — Encounter (HOSPITAL_BASED_OUTPATIENT_CLINIC_OR_DEPARTMENT_OTHER): Payer: BLUE CROSS/BLUE SHIELD | Admitting: General Surgery
